# Patient Record
Sex: Male | Born: 1950
Health system: Southern US, Community
[De-identification: ages and names within clinical notes are randomized; demographics above are authoritative.]

## PROBLEM LIST (undated history)

## (undated) ENCOUNTER — Emergency Department (HOSPITAL_COMMUNITY): Admission: EM | Discharge status: Absent without leave | Payer: Medicare Other | Source: Home / Self Care

## (undated) DIAGNOSIS — G4730 Sleep apnea, unspecified: Secondary | ICD-10-CM

## (undated) DIAGNOSIS — T7840XA Allergy, unspecified, initial encounter: Secondary | ICD-10-CM

## (undated) DIAGNOSIS — M1990 Unspecified osteoarthritis, unspecified site: Secondary | ICD-10-CM

## (undated) DIAGNOSIS — I10 Essential (primary) hypertension: Secondary | ICD-10-CM

## (undated) DIAGNOSIS — G8929 Other chronic pain: Secondary | ICD-10-CM

## (undated) DIAGNOSIS — I513 Intracardiac thrombosis, not elsewhere classified: Secondary | ICD-10-CM

## (undated) DIAGNOSIS — D126 Benign neoplasm of colon, unspecified: Secondary | ICD-10-CM

## (undated) DIAGNOSIS — E669 Obesity, unspecified: Secondary | ICD-10-CM

## (undated) DIAGNOSIS — N401 Enlarged prostate with lower urinary tract symptoms: Secondary | ICD-10-CM

## (undated) DIAGNOSIS — E785 Hyperlipidemia, unspecified: Secondary | ICD-10-CM

## (undated) DIAGNOSIS — M5430 Sciatica, unspecified side: Secondary | ICD-10-CM

## (undated) DIAGNOSIS — K439 Ventral hernia without obstruction or gangrene: Secondary | ICD-10-CM

## (undated) DIAGNOSIS — Z87442 Personal history of urinary calculi: Secondary | ICD-10-CM

## (undated) DIAGNOSIS — I219 Acute myocardial infarction, unspecified: Secondary | ICD-10-CM

## (undated) DIAGNOSIS — G2581 Restless legs syndrome: Secondary | ICD-10-CM

## (undated) DIAGNOSIS — G4700 Insomnia, unspecified: Secondary | ICD-10-CM

## (undated) DIAGNOSIS — F339 Major depressive disorder, recurrent, unspecified: Secondary | ICD-10-CM

## (undated) DIAGNOSIS — I2699 Other pulmonary embolism without acute cor pulmonale: Secondary | ICD-10-CM

## (undated) DIAGNOSIS — F419 Anxiety disorder, unspecified: Secondary | ICD-10-CM

## (undated) DIAGNOSIS — E0500 Thyrotoxicosis with diffuse goiter without thyrotoxic crisis or storm: Secondary | ICD-10-CM

## (undated) DIAGNOSIS — Z86711 Personal history of pulmonary embolism: Secondary | ICD-10-CM

## (undated) DIAGNOSIS — I471 Supraventricular tachycardia: Secondary | ICD-10-CM

## (undated) DIAGNOSIS — M48062 Spinal stenosis, lumbar region with neurogenic claudication: Secondary | ICD-10-CM

## (undated) DIAGNOSIS — I4819 Other persistent atrial fibrillation: Secondary | ICD-10-CM

## (undated) DIAGNOSIS — G4733 Obstructive sleep apnea (adult) (pediatric): Secondary | ICD-10-CM

## (undated) DIAGNOSIS — I340 Nonrheumatic mitral (valve) insufficiency: Secondary | ICD-10-CM

## (undated) DIAGNOSIS — N138 Other obstructive and reflux uropathy: Secondary | ICD-10-CM

## (undated) HISTORY — PX: TONSILLECTOMY: SUR1361

## (undated) HISTORY — DX: Allergy, unspecified, initial encounter: T78.40XA

## (undated) HISTORY — DX: Benign prostatic hyperplasia with lower urinary tract symptoms: N40.1

## (undated) HISTORY — DX: Essential (primary) hypertension: I10

## (undated) HISTORY — PX: CATARACT EXTRACTION: SUR2

## (undated) HISTORY — DX: Other persistent atrial fibrillation: I48.19

## (undated) HISTORY — DX: Obstructive sleep apnea (adult) (pediatric): G47.33

## (undated) HISTORY — DX: Spinal stenosis, lumbar region with neurogenic claudication: M48.062

## (undated) HISTORY — DX: Major depressive disorder, recurrent, unspecified: F33.9

## (undated) HISTORY — PX: KNEE ARTHROSCOPY: SUR90

## (undated) HISTORY — PX: REPLACEMENT TOTAL KNEE: SUR1224

## (undated) HISTORY — DX: Thyrotoxicosis with diffuse goiter without thyrotoxic crisis or storm: E05.00

## (undated) HISTORY — DX: Unspecified osteoarthritis, unspecified site: M19.90

## (undated) HISTORY — DX: Nonrheumatic mitral (valve) insufficiency: I34.0

## (undated) HISTORY — DX: Acute myocardial infarction, unspecified: I21.9

## (undated) HISTORY — DX: Benign neoplasm of colon, unspecified: D12.6

## (undated) HISTORY — DX: Sleep apnea, unspecified: G47.30

## (undated) HISTORY — DX: Hyperlipidemia, unspecified: E78.5

## (undated) HISTORY — DX: Obesity, unspecified: E66.9

## (undated) HISTORY — PX: ROTATOR CUFF REPAIR: SHX139

## (undated) HISTORY — DX: Insomnia, unspecified: G47.00

## (undated) HISTORY — DX: Anxiety disorder, unspecified: F41.9

## (undated) HISTORY — DX: Ventral hernia without obstruction or gangrene: K43.9

## (undated) HISTORY — DX: Other pulmonary embolism without acute cor pulmonale: I26.99

## (undated) HISTORY — DX: Other obstructive and reflux uropathy: N13.8

## (undated) HISTORY — DX: Other chronic pain: G89.29

## (undated) HISTORY — DX: Intracardiac thrombosis, not elsewhere classified: I51.3

## (undated) HISTORY — PX: FEMUR FRACTURE SURGERY: SHX633

## (undated) HISTORY — DX: Sciatica, unspecified side: M54.30

## (undated) HISTORY — PX: EYE SURGERY: SHX253

## (undated) HISTORY — PX: COLONOSCOPY W/ POLYPECTOMY: SHX1380

---

## 1998-11-26 ENCOUNTER — Ambulatory Visit (HOSPITAL_COMMUNITY): Admission: RE | Admit: 1998-11-26 | Discharge: 1998-11-26 | Payer: Self-pay | Admitting: Internal Medicine

## 1999-11-23 ENCOUNTER — Emergency Department (HOSPITAL_COMMUNITY): Admission: EM | Admit: 1999-11-23 | Discharge: 1999-11-24 | Payer: Self-pay | Admitting: Emergency Medicine

## 2004-01-21 ENCOUNTER — Ambulatory Visit: Payer: Self-pay | Admitting: Family Medicine

## 2004-02-29 ENCOUNTER — Ambulatory Visit: Payer: Self-pay | Admitting: Family Medicine

## 2004-03-06 ENCOUNTER — Ambulatory Visit: Payer: Self-pay | Admitting: Family Medicine

## 2004-05-29 ENCOUNTER — Inpatient Hospital Stay (HOSPITAL_COMMUNITY): Admission: RE | Admit: 2004-05-29 | Discharge: 2004-06-01 | Payer: Self-pay | Admitting: Orthopedic Surgery

## 2005-03-16 ENCOUNTER — Ambulatory Visit: Payer: Self-pay | Admitting: Family Medicine

## 2005-03-18 ENCOUNTER — Encounter: Admission: RE | Admit: 2005-03-18 | Discharge: 2005-03-18 | Payer: Self-pay | Admitting: Family Medicine

## 2005-03-30 ENCOUNTER — Ambulatory Visit: Payer: Self-pay | Admitting: Pulmonary Disease

## 2005-04-13 ENCOUNTER — Ambulatory Visit: Payer: Self-pay | Admitting: Family Medicine

## 2005-04-17 ENCOUNTER — Ambulatory Visit (HOSPITAL_BASED_OUTPATIENT_CLINIC_OR_DEPARTMENT_OTHER): Admission: RE | Admit: 2005-04-17 | Discharge: 2005-04-17 | Payer: Self-pay | Admitting: Pulmonary Disease

## 2005-04-27 ENCOUNTER — Ambulatory Visit: Payer: Self-pay | Admitting: Family Medicine

## 2005-04-30 ENCOUNTER — Ambulatory Visit: Payer: Self-pay | Admitting: Pulmonary Disease

## 2005-05-11 ENCOUNTER — Ambulatory Visit: Payer: Self-pay | Admitting: Pulmonary Disease

## 2005-07-20 ENCOUNTER — Ambulatory Visit: Payer: Self-pay | Admitting: Pulmonary Disease

## 2005-08-10 ENCOUNTER — Ambulatory Visit: Payer: Self-pay | Admitting: Pulmonary Disease

## 2005-12-24 ENCOUNTER — Ambulatory Visit (HOSPITAL_COMMUNITY): Admission: RE | Admit: 2005-12-24 | Discharge: 2005-12-25 | Payer: Self-pay | Admitting: Orthopedic Surgery

## 2006-04-13 ENCOUNTER — Ambulatory Visit: Payer: Self-pay | Admitting: Pulmonary Disease

## 2006-11-05 ENCOUNTER — Ambulatory Visit: Payer: Self-pay | Admitting: Family Medicine

## 2006-12-27 ENCOUNTER — Telehealth: Payer: Self-pay | Admitting: Family Medicine

## 2007-07-04 ENCOUNTER — Ambulatory Visit: Payer: Self-pay | Admitting: Family Medicine

## 2007-07-04 LAB — CONVERTED CEMR LAB
ALT: 17 units/L (ref 0–53)
AST: 19 units/L (ref 0–37)
Albumin: 4.4 g/dL (ref 3.5–5.2)
Alkaline Phosphatase: 67 units/L (ref 39–117)
BUN: 17 mg/dL (ref 6–23)
Basophils Absolute: 0 10*3/uL (ref 0.0–0.1)
Basophils Relative: 0.5 % (ref 0.0–1.0)
Bilirubin Urine: NEGATIVE
Bilirubin, Direct: 0.1 mg/dL (ref 0.0–0.3)
Blood in Urine, dipstick: NEGATIVE
CO2: 30 meq/L (ref 19–32)
Calcium: 9.3 mg/dL (ref 8.4–10.5)
Chloride: 104 meq/L (ref 96–112)
Cholesterol: 209 mg/dL (ref 0–200)
Creatinine, Ser: 0.9 mg/dL (ref 0.4–1.5)
Eosinophils Absolute: 0.2 10*3/uL (ref 0.0–0.7)
Eosinophils Relative: 2.5 % (ref 0.0–5.0)
GFR calc Af Amer: 111 mL/min
GFR calc non Af Amer: 92 mL/min
Glucose, Bld: 89 mg/dL (ref 70–99)
Glucose, Urine, Semiquant: NEGATIVE
HCT: 44.1 % (ref 39.0–52.0)
HDL: 35.9 mg/dL — ABNORMAL LOW (ref >39.0)
Hemoglobin: 15.1 g/dL (ref 13.0–17.0)
Ketones, urine, test strip: NEGATIVE
LDL Cholesterol: 151 mg/dL — ABNORMAL HIGH (ref 0–99)
Lymphocytes Relative: 20.3 % (ref 12.0–46.0)
MCHC: 34.3 g/dL (ref 30.0–36.0)
MCV: 89.8 fL (ref 78.0–100.0)
Monocytes Absolute: 0.7 10*3/uL (ref 0.1–1.0)
Monocytes Relative: 7.8 % (ref 3.0–12.0)
Neutro Abs: 6.2 10*3/uL (ref 1.4–7.7)
Neutrophils Relative %: 68.9 % (ref 43.0–77.0)
Nitrite: NEGATIVE
PSA: 1.05 ng/mL (ref 0.10–4.00)
Platelets: 221 10*3/uL (ref 150–400)
Potassium: 3.9 meq/L (ref 3.5–5.1)
Protein, U semiquant: NEGATIVE
RBC: 4.91 M/uL (ref 4.22–5.81)
RDW: 13 % (ref 11.5–14.6)
Sodium: 143 meq/L (ref 135–145)
Specific Gravity, Urine: 1.02
TSH: 2.73 microintl units/mL (ref 0.35–5.50)
Total Bilirubin: 0.8 mg/dL (ref 0.3–1.2)
Total CHOL/HDL Ratio: 5.8
Total Protein: 7.3 g/dL (ref 6.0–8.3)
Triglycerides: 111 mg/dL (ref 0–149)
Urobilinogen, UA: 0.2
VLDL: 22 mg/dL (ref 0–40)
WBC Urine, dipstick: NEGATIVE
WBC: 8.9 10*3/uL (ref 4.5–10.5)
pH: 6

## 2007-07-18 ENCOUNTER — Ambulatory Visit: Payer: Self-pay | Admitting: Family Medicine

## 2007-07-18 DIAGNOSIS — F411 Generalized anxiety disorder: Secondary | ICD-10-CM | POA: Insufficient documentation

## 2007-08-15 ENCOUNTER — Ambulatory Visit: Payer: Self-pay | Admitting: Family Medicine

## 2007-08-15 DIAGNOSIS — I119 Hypertensive heart disease without heart failure: Secondary | ICD-10-CM | POA: Insufficient documentation

## 2007-09-19 ENCOUNTER — Ambulatory Visit: Payer: Self-pay | Admitting: Family Medicine

## 2007-10-17 ENCOUNTER — Ambulatory Visit: Payer: Self-pay | Admitting: Family Medicine

## 2007-10-19 LAB — CONVERTED CEMR LAB
BUN: 15 mg/dL (ref 6–23)
CO2: 30 meq/L (ref 19–32)
Calcium: 9.6 mg/dL (ref 8.4–10.5)
Chloride: 103 meq/L (ref 96–112)
Creatinine, Ser: 0.8 mg/dL (ref 0.4–1.5)
GFR calc Af Amer: 127 mL/min
GFR calc non Af Amer: 105 mL/min
Glucose, Bld: 90 mg/dL (ref 70–99)
Potassium: 3.7 meq/L (ref 3.5–5.1)
Sodium: 141 meq/L (ref 135–145)

## 2008-06-19 ENCOUNTER — Telehealth: Payer: Self-pay | Admitting: *Deleted

## 2008-06-20 ENCOUNTER — Telehealth: Payer: Self-pay | Admitting: Family Medicine

## 2008-06-20 ENCOUNTER — Ambulatory Visit: Payer: Self-pay | Admitting: Family Medicine

## 2008-06-20 DIAGNOSIS — J45909 Unspecified asthma, uncomplicated: Secondary | ICD-10-CM

## 2008-06-20 HISTORY — DX: Unspecified asthma, uncomplicated: J45.909
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
## 2010-02-06 NOTE — Progress Notes (Signed)
Summary: grandchildren  Phone Note Call from Patient   Caller: live Call For: Sydnie Sigmund Summary of Call: Calling to see if he should reschedule grandchildren's weekend stay with them.  Told him yes, if he can would be best for both him getting extra rest & the children not being exposed possibly.   Initial call taken by: Rudy Jew, RN,  June 20, 2008 12:08 PM  Follow-up for Phone Call        Phone Call Completed Follow-up by: Kern Reap CMA,  June 20, 2008 12:18 PM

## 2010-02-06 NOTE — Progress Notes (Signed)
Summary: refill  Phone Note Call from Patient Call back at [phone]   Caller: patient triage message Call For: Malakye Nolden Summary of Call: lexapro [facility] [address]  Initial call taken by: Roselle Locus,  December 27, 2006 1:07 PM  Follow-up for Phone Call        DONE EMR Follow-up by: Arcola Jansky, RN,  December 29, 2006 1:27 PM

## 2010-02-07 ENCOUNTER — Inpatient Hospital Stay (HOSPITAL_COMMUNITY)
Admission: EM | Admit: 2010-02-07 | Discharge: 2010-02-10 | DRG: 243 | Disposition: A | Discharge status: Home / Self Care | Payer: Federal, State, Local not specified - PPO | Attending: Internal Medicine | Admitting: Internal Medicine

## 2010-02-07 DIAGNOSIS — E876 Hypokalemia: Secondary | ICD-10-CM | POA: Diagnosis present

## 2010-02-07 DIAGNOSIS — M1610 Unilateral primary osteoarthritis, unspecified hip: Secondary | ICD-10-CM | POA: Diagnosis present

## 2010-02-07 DIAGNOSIS — F411 Generalized anxiety disorder: Secondary | ICD-10-CM | POA: Diagnosis present

## 2010-02-07 DIAGNOSIS — M25559 Pain in unspecified hip: Secondary | ICD-10-CM | POA: Diagnosis present

## 2010-02-07 DIAGNOSIS — I10 Essential (primary) hypertension: Secondary | ICD-10-CM | POA: Diagnosis present

## 2010-02-07 DIAGNOSIS — Z809 Family history of malignant neoplasm, unspecified: Secondary | ICD-10-CM

## 2010-02-07 DIAGNOSIS — M169 Osteoarthritis of hip, unspecified: Secondary | ICD-10-CM | POA: Diagnosis present

## 2010-02-07 DIAGNOSIS — IMO0002 Reserved for concepts with insufficient information to code with codable children: Principal | ICD-10-CM | POA: Diagnosis present

## 2010-02-08 ENCOUNTER — Emergency Department (HOSPITAL_COMMUNITY): Payer: Federal, State, Local not specified - PPO

## 2010-02-08 LAB — URINALYSIS, ROUTINE W REFLEX MICROSCOPIC
Bilirubin Urine: NEGATIVE
Hgb urine dipstick: NEGATIVE
Ketones, ur: NEGATIVE mg/dL
Nitrite: NEGATIVE
Protein, ur: NEGATIVE mg/dL
Specific Gravity, Urine: 1.01 (ref 1.005–1.030)
Urine Glucose, Fasting: NEGATIVE mg/dL
Urobilinogen, UA: 0.2 mg/dL (ref 0.0–1.0)
pH: 7.5 (ref 5.0–8.0)

## 2010-02-08 LAB — CBC
HCT: 44.7 % (ref 39.0–52.0)
Hemoglobin: 16.8 g/dL (ref 13.0–17.0)
MCH: 30.6 pg (ref 26.0–34.0)
MCV: 81.4 fL (ref 78.0–100.0)
Platelets: 148 10*3/uL — ABNORMAL LOW (ref 150–400)
RBC: 5.49 MIL/uL (ref 4.22–5.81)
RDW: 12.9 % (ref 11.5–15.5)
WBC: 10.1 10*3/uL (ref 4.0–10.5)

## 2010-02-08 LAB — DIFFERENTIAL
Basophils Absolute: 0.1 10*3/uL (ref 0.0–0.1)
Basophils Relative: 1 % (ref 0–1)
Eosinophils Absolute: 0.2 10*3/uL (ref 0.0–0.7)
Eosinophils Relative: 2 % (ref 0–5)
Lymphocytes Relative: 21 % (ref 12–46)
Lymphs Abs: 2.1 10*3/uL (ref 0.7–4.0)
Monocytes Absolute: 0.8 10*3/uL (ref 0.1–1.0)
Monocytes Relative: 8 % (ref 3–12)
Neutro Abs: 6.9 10*3/uL (ref 1.7–7.7)
Neutrophils Relative %: 68 % (ref 43–77)

## 2010-02-08 LAB — BASIC METABOLIC PANEL
BUN: 9 mg/dL (ref 6–23)
CO2: 27 mEq/L (ref 19–32)
Calcium: 9.3 mg/dL (ref 8.4–10.5)
Chloride: 98 mEq/L (ref 96–112)
Creatinine, Ser: 0.75 mg/dL (ref 0.4–1.5)
GFR calc Af Amer: >60 mL/min (ref >60)
GFR calc non Af Amer: >60 mL/min (ref >60)
Glucose, Bld: 118 mg/dL — ABNORMAL HIGH (ref 70–99)
Potassium: 3.6 mEq/L (ref 3.5–5.1)
Sodium: 137 mEq/L (ref 135–145)

## 2010-02-08 LAB — HEPATIC FUNCTION PANEL
ALT: 12 U/L (ref 0–53)
AST: 20 U/L (ref 0–37)
Albumin: 4.1 g/dL (ref 3.5–5.2)
Alkaline Phosphatase: 68 U/L (ref 39–117)
Bilirubin, Direct: 0.1 mg/dL (ref 0.0–0.3)
Indirect Bilirubin: 1.1 mg/dL — ABNORMAL HIGH (ref 0.3–0.9)
Total Bilirubin: 1.2 mg/dL (ref 0.3–1.2)
Total Protein: 7.1 g/dL (ref 6.0–8.3)

## 2010-02-08 MED ORDER — IOHEXOL 300 MG/ML  SOLN
125.0000 mL | Freq: Once | INTRAMUSCULAR | Status: AC | PRN
  Administered 2010-02-08: 125 mL via INTRAVENOUS

## 2010-02-09 ENCOUNTER — Encounter (HOSPITAL_COMMUNITY): Payer: Self-pay | Admitting: Radiology

## 2010-02-09 ENCOUNTER — Inpatient Hospital Stay (HOSPITAL_COMMUNITY): Payer: Federal, State, Local not specified - PPO

## 2010-02-09 LAB — CBC
HCT: 39.8 % (ref 39.0–52.0)
Hemoglobin: 14.2 g/dL (ref 13.0–17.0)
MCH: 29.9 pg (ref 26.0–34.0)
MCHC: 35.7 g/dL (ref 30.0–36.0)
MCV: 83.8 fL (ref 78.0–100.0)
Platelets: 171 10*3/uL (ref 150–400)
RBC: 4.75 MIL/uL (ref 4.22–5.81)
RDW: 12.8 % (ref 11.5–15.5)
WBC: 9.2 10*3/uL (ref 4.0–10.5)

## 2010-02-09 LAB — BASIC METABOLIC PANEL
BUN: 12 mg/dL (ref 6–23)
CO2: 32 mEq/L (ref 19–32)
Calcium: 9.1 mg/dL (ref 8.4–10.5)
Chloride: 98 mEq/L (ref 96–112)
Creatinine, Ser: 0.87 mg/dL (ref 0.4–1.5)
GFR calc Af Amer: >60 mL/min (ref >60)
GFR calc non Af Amer: >60 mL/min (ref >60)
Glucose, Bld: 106 mg/dL — ABNORMAL HIGH (ref 70–99)
Potassium: 3 mEq/L — ABNORMAL LOW (ref 3.5–5.1)
Sodium: 138 mEq/L (ref 135–145)

## 2010-02-10 LAB — BASIC METABOLIC PANEL
BUN: 16 mg/dL (ref 6–23)
CO2: 32 mEq/L (ref 19–32)
Calcium: 9.3 mg/dL (ref 8.4–10.5)
Chloride: 99 mEq/L (ref 96–112)
Creatinine, Ser: 1.03 mg/dL (ref 0.4–1.5)
GFR calc Af Amer: >60 mL/min (ref >60)
GFR calc non Af Amer: >60 mL/min (ref >60)
Glucose, Bld: 95 mg/dL (ref 70–99)
Potassium: 3.1 mEq/L — ABNORMAL LOW (ref 3.5–5.1)
Sodium: 140 mEq/L (ref 135–145)

## 2010-02-10 LAB — MAGNESIUM: Magnesium: 2.3 mg/dL (ref 1.5–2.5)

## 2010-02-11 LAB — UIFE/LIGHT CHAINS/TP QN, 24-HR UR
Albumin, U: DETECTED
Alpha 1, Urine: DETECTED — AB
Alpha 2, Urine: DETECTED — AB
Beta, Urine: DETECTED — AB
Free Kappa Lt Chains,Ur: 2.66 mg/dL — ABNORMAL HIGH (ref 0.04–1.51)
Free Lambda Lt Chains,Ur: 0.12 mg/dL (ref 0.08–1.01)
Gamma Globulin, Urine: NOT DETECTED
Total Protein, Urine: 5.2 mg/dL

## 2010-02-11 LAB — PROTEIN ELECTROPH W RFLX QUANT IMMUNOGLOBULINS
Albumin ELP: 61.5 % (ref 55.8–66.1)
Alpha-1-Globulin: 3.7 % (ref 2.9–4.9)
Alpha-2-Globulin: 10.1 % (ref 7.1–11.8)
Beta 2: 3.9 % (ref 3.2–6.5)
Beta Globulin: 5.9 % (ref 4.7–7.2)
Gamma Globulin: 14.9 % (ref 11.1–18.8)
M-Spike, %: NOT DETECTED g/dL
Total Protein ELP: 7.4 g/dL (ref 6.0–8.3)

## 2010-02-12 LAB — UIFE/LIGHT CHAINS/TP QN, 24-HR UR
Albumin, U: DETECTED
Alpha 1, Urine: DETECTED — AB
Alpha 2, Urine: DETECTED — AB
Beta, Urine: DETECTED — AB
Free Kappa Lt Chains,Ur: 6.4 mg/dL — ABNORMAL HIGH (ref 0.04–1.51)
Free Kappa/Lambda Ratio: 16.84 ratio — ABNORMAL HIGH (ref 0.46–4.00)
Free Lambda Excretion/Day: 5.89 mg/d
Free Lambda Lt Chains,Ur: 0.38 mg/dL (ref 0.08–1.01)
Free Lt Chn Excr Rate: 99.2 mg/d
Gamma Globulin, Urine: DETECTED — AB
Time: 24 hours
Total Protein, Urine-Ur/day: 181 mg/d — ABNORMAL HIGH (ref 10–140)
Total Protein, Urine: 11.7 mg/dL
Volume, Urine: 1550 mL

## 2010-02-17 NOTE — Discharge Summary (Signed)
NAME:  Brandon [name], Brandon [name]                    ACCOUNT NO.:  [id number]  MEDICAL RECORD NO.:  [id number]           PATIENT TYPE:  I  LOCATION:  1609                         FACILITY:  [facility]  PHYSICIAN:  Osvaldo Shipper, MD     DATE OF BIRTH:  18-Jul-1950  DATE OF ADMISSION:  02/07/2010 DATE OF DISCHARGE:  02/10/2010                              DISCHARGE SUMMARY   PRIMARY CARE PHYSICIAN:  Dr. Tresa Endo  During this hospitalization, he was seen in consultation by Dr. Charlann Boxer from [facility].  IMAGING STUDIES DONE DURING THIS ADMISSION: 1. X-ray of the right hip on February 4, which showed no evidence for     acute hip fracture or dislocation.  Mild bilateral hip     osteoarthritis was seen. 2. CT of the abdomen and pelvis with contrast was done, which did not     show any acute lesions, nephrolithiasis without obstructive     uropathy was noted.  Bilateral hip degenerative joint disease was     noted. 3. X-ray of the lumbar spine showed moderate spondylosis of the lumbar     spine mostly affecting the L1-L2 and L2-L3 levels.  Subsequently,     the patient had an MRI of the lumbar spine, which showed mild     lateral recess encroachment bilaterally at L2-L3, shallow broad-     based foraminal and extraforaminal disk protrusion on the right     irritating the right L2 nerve was seen, shallow broad-based left     foraminal and extraforaminal disk protrusion at L3-L4 was seen. 4. MRI of the right hip showed mild symmetric hip joint degenerative     changes bilaterally, but no acute bony findings.  Remote     posttraumatic and postsurgical changes involving the     subtrochanteric portion of the left hip.  DISCHARGE DIAGNOSES: 1. Right hip pain, possibly radiculopathy from lumbar spine. 2. History of anxiety disorder. 3. History of hypertension. 4. Hypokalemia.  BRIEF HOSPITAL COURSE:  Briefly, this is a 60 year old Caucasian male who presented to the hospital with complaints of severe  right hip pain. The patient told us that the pain had been ongoing for almost a week and a half.  The patient was evaluated in the ED, no acute findings were noted.  We were asked to evaluate for pain management and admission. The patient was admitted.  He was seen by Dr. Charlann Boxer who recommended steroids.  Initially, we did not get an MRI; however, his pain was not improving, so MRI was done, which has been discussed above.  It appears that the pain is probably radiculopathy from his lumbar spine area rather than anything in the hip.  No lesions were noted.  Since there was a family history of multiple myeloma, we went ahead and ordered SPEP and UPEP and all those are pending at this time.  The patient is able to ambulate.  He experienced severe pain when he is rolling over in his bed, when trying to sit up, and when he tries to sit down.  When he is standing, he is able to  weight bear.  This typically is also there when he tries to have a bowel movement.  So, in view of this, we have to put him on a bowel regimen, muscle relaxants, Neurontin, and analgesic agents.  I have explained to the patient that this may take several weeks for it to get better.  There is no role for immediate surgery at this time.  Dr. Charlann Boxer wants to follow up in his office in 2 weeks.  We will continue with steroid taper.  He was also noted to have hypokalemia.  Today, he was given potassium; however, he continues to be hypokalemic today.  Magnesium level has not been checked; however, we will empirically give him magnesium oxide.  PHYSICAL EXAMINATION:  GENERAL:  On the day of discharge, the patient appears to be comfortable as far as he is lying flat on the bed.  It is only when he rolls over that, he has extreme pain.  Otherwise, he denies any other problems. VITAL SIGNS:  Stable.  His blood pressure does tend to run high whenever he is having acute pain.  Heart rate in the 50s, occasionally in the 40s;  respiratory rate is 18; saturation 93% on room air. LUNGS:  Clear to auscultation bilaterally with no wheezing, rales, or rhonchi. CARDIOVASCULAR:  S1, S2 is normal, regular.  No S3, S4, rubs, murmurs, or bruits. ABDOMEN:  Soft, nontender, nondistended.  DISCHARGE MEDICATIONS: 1. Colace 100 mg p.o. b.i.d. 2. Neurontin 300 mg capsule, 2 capsules 3 times a day. 3. Magnesium oxide 400 mg p.o. b.i.d. for 10 days. 4. Potassium chloride 20 mEq 2 tablets daily for 7 days. 5. Methocarbamol 750 mg 4 times daily. 6. Oxycodone IR 5 mg 1-2 tablets every 4 hours as needed. 7. Prednisone taper. 8. MiraLax 17 g p.o. b.i.d. 9. Toradol 10 mg p.o. q.6 h. as needed. 10.Atenolol/chlorthalidone 50/25 one tablet every morning. 11.Lexapro 20 mg p.o. daily.  FOLLOWUP:  With Dr. Charlann Boxer in 2 weeks.  PHYSICAL ACTIVITY:  As tolerated.  He has been asked to keep moving both his lower extremities and to wear compression stockings during the nighttime to avoid blood clots.  DIET:  As before.  DIAGNOSTIC STUDIES:  Pending studies include at this time, SPEP and UPEP.  TOTAL TIME ON THIS DISCHARGE ENCOUNTER:  35 minutes.  Please also note we will have PT to see him before discharge.     Osvaldo Shipper, MD     GK/MEDQ  D:  02/10/2010  T:  02/10/2010  Job:  [id number]  Electronically Signed by Osvaldo Shipper MD on 02/17/2010 07:44:55 PM

## 2010-02-17 NOTE — H&P (Signed)
NAME:  Brandon [name], Brandon [name]                    ACCOUNT NO.:  [id number]  MEDICAL RECORD NO.:  [id number]           PATIENT TYPE:  E  LOCATION:  WLED                         [facility]ACILITY:  [facility]  PHYSICIAN:  Osvaldo Shipper, MD     DATE OF BIRTH:  04-Feb-1950  DATE OF ADMISSION:  02/07/2010 DATE OF DISCHARGE:                             HISTORY & PHYSICAL   PRIMARY CARE PHYSICIAN:  The patient's primary care physician is Dr. Tresa Endo.  His orthopedic doctor is in [facility].  ADMISSION DIAGNOSES: 1. Severe right hip pain with immobilization. 2. History of hypertension. 3. History of anxiety disorder.  CHIEF COMPLAINT:  Right hip pain for about 2 weeks.  HISTORY OF PRESENT ILLNESS:  The patient is a 60 year old Caucasian male who has a history of hypertension and anxiety disorder who also has arthritis who was in his usual state of health until last week when he had to go to [employer] to do volunteering work.  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  He did this on Tuesday, Thursday, and Saturday.  When he went on Tuesday, he had to sit for prolonged period of time and he noticed after the end of the day that he was having pain in the right hip area.  The pain was located anteriorly, he said over the phone it was 10 out of 10 in intensity.  He applied heating pad, took Advil.  He went back on Thursday and Saturday to volunteer again, the pain continued to get worse.  He applied a heating pad with no relief and he found that he was finding it more and more difficult to move around.  Over the last week, the pain has gotten severe.  The Tylenol has not helped either.  The pain was initially radiating to the lower back but not currently.  It was initially radiating to the groin area but not currently.  He denies any falls or any other trauma.  The pain would be more severe when he would walk or move or rollover.  The pain got so severe that last night he  had to call 911.  Currently, he does live with his wife but she is currently out of town.  The patient had a similar pain about a year ago, went to the orthopedic doctor, had MRI done which was negative.  He was given pain medications and the pain resolved after a week.  MEDICATIONS AT HOME:  Atenolol and Lexapro, doses are unknown; and he has been taking some Advil over-the-counter for pain relief.  ALLERGIES:  No known drug allergies.  PAST MEDICAL HISTORY:  Positive for anxiety disorder, hypertension, arthritis.  He had a left total knee replacement.  He has had fractures of the left femur x2.  He has had right shoulder rotator cuff surgery. He had arthroscopy to the left shoulder.  SOCIAL HISTORY:  He lives in [place] with his wife who is currently out of town.  He is a retired Advertising copywriter.  He denies smoking.  He rarely drinks alcohol.  No illicit drug use.  He is fairly active.  He does swim and has cycled in the past.  FAMILY HISTORY:  Father had multiple myeloma.  Mother had asthma.  REVIEW OF SYSTEMS:  GENERAL SYSTEM:  Positive for weakness, malaise. HEENT:  Unremarkable.  CARDIOVASCULAR:  Unremarkable.  GI: Unremarkable. GU: Unremarkable.  NEUROLOGIC:  Unremarkable.  PSYCHIATRIC: Unremarkable.  MUSCULOSKELETAL:  As in HPI.  Other systems reviewed and found to be negative.  He denies any dysuria or hematuria, specifically. Denies any nausea or vomiting.  PHYSICAL EXAMINATION:  VITAL SIGNS:  Temperature 98.8, blood pressure has been as high as 183/101 probably from the pain.  His heart rate is 57, respiratory rate is 16, saturation 97% on room air. GENERAL EXAM:  He is slightly obese white male, in no distress, slightly anxious. HEENT:  Head is normocephalic, atraumatic.  Pupils are equal and reacting.  No pallor, no icterus.  Oral mucous membranes moist.  No oral lesions are noted. NECK:  Soft, supple.  No thyromegaly is appreciated.  No cervical, supraclavicular,  inguinal lymphadenopathy is present. LUNGS:  Clear to auscultation bilaterally with no wheezing, rales or rhonchi. CARDIOVASCULAR:  S1, S2 are normal and regular.  No rubs, murmurs or bruits.  Slightly bradycardic. ABDOMEN:  Soft, nontender, nondistended.  Bowel sounds are present.  No masses or organomegaly appreciated. GU: Deferred. NEUROLOGIC:  He is alert, oriented x3.  No focal neurological deficits are present. EXTREMITIES:  Examination of the right hip area, there was no limitation with flexion.  No limitation with internal or external rotation. Extension could not be checked because the patient could not roll over. Straight leg raising was also unremarkable.  No palpable tenderness was present.  When we attempted to roll the patient to the right, the patient had excruciating pain.  LABORATORY DATA:  His CBC is remarkable just for a platelet count of 148.  On the WBC morphology, toxic granulation was mentioned, however, the total white count is normal.  BMET is unremarkable except for glucose of 118.  UA was negative.  IMAGING STUDIES:  He had a right hip film which showed no evidence for any fracture or dislocation, hip osteoarthritis was noted bilaterally. CT of the abdomen and pelvis without contrast was done, did not show any acute abdominal or pelvic findings, nephrolithiasis without obstructive uropathy was noted.  Bilateral hip degenerative changes without focal destructive lesion were also seen.  ASSESSMENT:  This is a 60 year old Caucasian male with hypertension, anxiety disorder who comes in with right hip pain.  He is essentially unable to move.  He has severe pain with movements.  He is unable to function.  Reason for this pain is not entirely clear.  There is no obvious fracture noted either on the x-ray or on the CAT scan.  He could have some muscular spasm, this could be pain coming from the spine.  PLAN: 1. Right hip pain.  We will observe the patient in  the hospital, start     him on pain medications Toradol, muscle relaxants.  I have     discussed this case with Dr. Charlann Boxer who is covering for [facility].  He will see the patient in the ED.  He recommends     lumbar spine films and starting the patient on prednisone.  PT/OT     will also be utilized. 2. History of hypertension.  The blood pressure is high probably from     the pain.  He will resume his atenolol and  monitor his blood     pressure closely. 3. Anxiety disorder.  Continue with Lexapro. 4. Deep vein thrombosis prophylaxis utilized. 5. Labs will be followed up to the morning. 6. He is a full code.  Further management decisions will depend on results of further testing and patient's response to treatment.  Osvaldo Shipper, MD     GK/MEDQ  D:  02/08/2010  T:  02/08/2010  Job:  [id number]  cc:   Madlyn Frankel Charlann Boxer, M.D. Fax: [phone]  Electronically Signed by Osvaldo Shipper MD on 02/17/2010 07:46:05 PM

## 2010-02-18 NOTE — Consult Note (Signed)
NAME:  Brandon [name], Brandon [name]                    ACCOUNT NO.:  [id number]  MEDICAL RECORD NO.:  [id number]           PATIENT TYPE:  E  LOCATION:  WLED                         FACILITY:  [facility]  PHYSICIAN:  Madlyn Frankel. Charlann Boxer, M.D.  DATE OF BIRTH:  [birth year]  DATE OF CONSULTATION:  02/08/2010 DATE OF DISCHARGE:                                CONSULTATION   CHIEF COMPLAINT:  Right hip pain.  HISTORY:  Brandon [name] is a 60 year old gentleman who I had seen in the office before he was admitted to the hospital by the Medical Service for a couple-week history of some unrelenting anterior hip-type pain.  He was seen and evaluated in the emergency room and had radiographs.  This included a CT scan of his abdomen and pelvis.  Bony landmarks were able to be reviewed.  Brandon [name] reports a gradual onset of some pain a couple of weeks ago, but fairly significant pain over the past week, which is making it hard for him to get out of bed and move around.  He states that sitting upright hurts the worst.  He has had some pain mainly over the anterior superior iliac spine area per his localization.  He has had some bilateral groin pain, but no numbness and tingling, no loss of bowel or bladder dysfunction and some pain that has radiated around the back, but it seems to focus back to the anterior superior iliac spine. It is not specifically located to the groin at this point.  He has tried some medications at home without much relief.  PAST MEDICAL HISTORY:  Medical history includes hypertension and some anxiety.  PAST SURGICAL HISTORY:  History of a left femur fracture treated as well as a left total-knee replacement.  He has had a left shoulder arthroscopic surgery as well.  CURRENT MEDICATIONS:  Atenolol, Lexapro, and over-the-counter medications.  SOCIAL HISTORY:  He lives in [place].  He is a Advertising copywriter still. He denies any smoking and rarely drinks alcohol.  He enjoys exercising otherwise.  FAMILY  HISTORY:  Includes a father with multiple myeloma and mother with asthma.  REVIEW OF SYSTEMS:  Other than his recent reports of pain and discomfort related to his right hip, he has had no recent fevers, chills, night sweats.  No unexplained weight loss.  No headaches, dizziness, blurred vision, slurred speech.  No chest pain.  No productive cough or hemoptysis.  No true abdominal pain, nausea or vomiting.  No burning or frequency with urination.  No unilateral weakness or numbness reported unilaterally.  PHYSICAL EXAMINATION:  Brandon [name] was seen and evaluated in the T[facility] Unit of [facility].  He is in his hospital bed.  He moves his hip around just fine and he actually was lying on his right side and rolled over without much difficulty.  It is difficult to palpate the location of his pain but he points to the anterior superior iliac spine area but does not have much tenderness to palpation.  He had no pain with deep palpation to his abdominal area in this area.  He tolerates hip range  of motion without significant reproduced pain.  He is otherwise neurovascular intact in the right lower extremity compared to his left.  RADIOGRAPHS:  I reviewed AP pelvis and lateral right hip in addition to a CT scan of his abdomen, which showed bony details.  I see no evidence of any lytic lesions or expansile lesions within the iliac crest.  He has bilateral hip osteoarthritis, moderate to advanced.  An AP and lateral of his lumbar spine also were reviewed, indicating relative preservation of disk space with normal coronal and sagittal alignment.  ASSESSMENT AND PLAN:  Uncertain etiology to a pain over the anterior pelvic bony mass with a differential including either a hip osteoarthritis with an atypical presentation versus herniated disk versus perhaps the concern of a presentation of multiple myeloma would have to be in the differential given his family history.  At this point, I recommended an  attempt at a prednisone taper starting at 40 mg, 5 mg decreasing daily with followup with me in 2 weeks.  Perhaps the Medical Team would like to order protein analysis and maybe a urinalysis to check and rule out multiple myeloma as a source.  However, radiographs were not convincing of any significant bony pathology to date. Questions were encouraged and answers were reviewed with him regarding this.     Madlyn Frankel Charlann Boxer, M.D.     MDO/MEDQ  D:  02/08/2010  T:  02/08/2010  Job:  [id number]  Electronically Signed by Durene Romans M.D. on 02/11/2010 09:15:02 AM

## 2010-02-19 ENCOUNTER — Other Ambulatory Visit: Payer: Self-pay | Admitting: Specialist

## 2010-02-19 DIAGNOSIS — M549 Dorsalgia, unspecified: Secondary | ICD-10-CM

## 2010-02-20 ENCOUNTER — Other Ambulatory Visit: Payer: Federal, State, Local not specified - PPO

## 2010-02-20 ENCOUNTER — Ambulatory Visit
Admission: RE | Admit: 2010-02-20 | Discharge: 2010-02-20 | Disposition: A | Discharge status: Home / Self Care | Payer: Federal, State, Local not specified - PPO | Source: Ambulatory Visit | Attending: Specialist | Admitting: Specialist

## 2010-02-20 DIAGNOSIS — M549 Dorsalgia, unspecified: Secondary | ICD-10-CM

## 2010-02-27 ENCOUNTER — Other Ambulatory Visit: Payer: Self-pay | Admitting: Specialist

## 2010-02-27 DIAGNOSIS — R1031 Right lower quadrant pain: Secondary | ICD-10-CM

## 2010-02-27 DIAGNOSIS — M545 Low back pain, unspecified: Secondary | ICD-10-CM

## 2010-03-04 ENCOUNTER — Ambulatory Visit
Admission: RE | Admit: 2010-03-04 | Discharge: 2010-03-04 | Disposition: A | Discharge status: Home / Self Care | Payer: Federal, State, Local not specified - PPO | Source: Ambulatory Visit | Attending: Specialist | Admitting: Specialist

## 2010-03-04 DIAGNOSIS — M545 Low back pain, unspecified: Secondary | ICD-10-CM

## 2010-03-04 DIAGNOSIS — R1031 Right lower quadrant pain: Secondary | ICD-10-CM

## 2010-05-23 NOTE — Op Note (Signed)
NAME:  Brandon [name], Brandon [name]                    ACCOUNT NO.:  [id number]   MEDICAL RECORD NO.:  [id number]          PATIENT TYPE:  AMB   LOCATION:  SDS                          FACILITY:  [facility]   PHYSICIAN:  Vania Rea. Supple, M.D.  DATE OF BIRTH:  1950/02/19   DATE OF PROCEDURE:  12/24/2005  DATE OF DISCHARGE:                               OPERATIVE REPORT   PREOPERATIVE DIAGNOSIS:  1. Chronic right shoulder impingement syndrome.  2. Right shoulder rotator cuff tear.  3. Right shoulder AC joint arthrosis   POSTOPERATIVE DIAGNOSIS:  1. Chronic right shoulder impingement syndrome.  2. Right shoulder rotator cuff tear.  3. Right shoulder AC joint arthrosis.  4. Chondromalacia glenoid.  5. Extensive degenerative labral tear.  6. Intra-articular chondral loose body.   PROCEDURE:  1. Right shoulder examination under anesthesia.  2. Right shoulder diagnostic arthroscopy.  3. Debridement of extensive degenerative labral tear.  4. Chondroplasty of glenoid.  5. Removal of chondral loose body.  6. Arthroscopic subacromial decompression and bursectomy.  7. Arthroscopic distal clavicle resection.  8. Arthroscopic rotator cuff repair utilizing a double row suture      bridge repair construct.   SURGEON:  Vania Rea. Supple, M.D.   ASSISTANTFrench Ana Shuford PA-C.   ANESTHESIA:  Was a general endotracheal as well as a preop interscalene  block.   ESTIMATED BLOOD LOSS:  Minimal.   DRAINS:  None.   HISTORY:  Mr. Wainer is a 60 year old gentleman who has had chronic right  shoulder pain with weakness and restrictions in mobility and on  examination shows painful arc shoulder motion with pain, weakness on  testing of rotator cuff.  Preoperative x-rays and MRI scan confirm AC  arthrosis as well as a full-thickness tear of the rotator cuff.  Due to  his ongoing pain and functional limitations he is brought to the  operating room this time for planned right shoulder arthroscopy as  described below.   We  counseled Mr. Cerino on treatment options as well as risks versus  benefits thereof.  Possible surgical complications including infection,  neurovascular injury persistent pain, loss motion, anesthetic  complications, recurrence rotator cuff tear, and possible need for  additional surgery were all reviewed.  He understands and accepts and  agrees with our planned procedure.   PROCEDURE IN DETAIL:  After undergoing routine preop evaluation, the  patient received prophylactic antibiotics.  An interscalene block was  established in the holding area by the anesthesia department.  Placed  supine on the operating table and underwent smooth induction of a  general endotracheal anesthesia.  Turned to the left lateral decubitus  position on the beanbag and appropriately padded and protected.  The  right shoulder examination under anesthesia revealed nearly full passive  motion with some very mild capsule tightness at end range.  Gentle  stretching of shoulder was performed but there was no palpable or  audible release of adhesions.  Right arm was suspended at 70 degrees of  abduction with 15 pounds of traction then the right shoulder girdle  region was then sterilely  prepped and draped standard fashion.  A  posterior portal was established into the glenohumeral joint.  An  anterior portal established under direct visualization.  There were  noted the several small and one larger chondral or fibrous loose bodies  within the shoulder joint.  These evacuated through the arthroscopic  shaver.  There was extensive degenerative tearing of the anterior-  posterior and superior labrum and all these areas were debrided with a  shaver back to stable peripheral margin.  Biceps tendon showed normal  caliber and was in good condition and no obvious instability biceps  anchor.  Rotator cuff showed an obvious full-thickness defect involving  the distal aspect of the supraspinatus.  This was debrided from the   articular side.  There were no obvious instability patterns.  At this  point, fluid and instruments were removed from the glenohumeral joint  and arm was dropped down to 30 degrees of abduction.  The arthroscope  introduced in the subacromial space through the posterior portal and  direct lateral portal established in the subhumeral space.  Abundant  proliferative bursal tissue was encountered and resected with a  combination the shaver and the Arthrex wand.  The wand was then used to  remove the periosteum from the undersurface of the anterior half of the  acromion and subacromial decompression was performed with a bur creating  type 1 morphology.  The portals established directly anterior to distal  clavicle and distal clavicle resection performed with a bur.  Care was  taken to make sure the entire circumference of the distal clavicle could  be visualized to ensure adequate removal of bone.  We then completed the  subacromial bursectomy and debrided the rotator cuff from the bursal  side and trimmed the rotator cuff margin back to healthy tissue.  Residual soft tissue at the apex of the tuberosity toward the rotator  cuff side was removed with the Arthrex wand and this area was gently  abraded with a bur.  An accessory portal device was then established  through stab wound on the lateral margin of the acromion, placed Arthrex  bio corkscrew suture anchor.  The limbs of the suture anchor were then  passed through the adjacent margin of the rotator cuff utilizing a Mitek  suture retriever in a horizontal mattress construct.  These were then  tied with sliding locking knots followed by multiple overhand throws and  alternating posts.  We then created a suture bridge utilizing two  Arthrex push lock suture anchors and these were then impacted obtaining  good stability and nicely reinforcing the rotator cuff repair compressing the free margin rotator cuff against the bony bed on the   tuberosity.  Suture limbs were then clipped.  At this point final  inspection and irrigation was completed.  Hemostasis was obtained.  Following this instruments was removed. Ports closed with Monocryl and  Steri-Strips.  Bulky dressing taped on the right shoulder and right arm  was placed sling immobilizer. The patient was placed supine, extubated  and taken recovery room in stable condition.      Vania Rea. Supple, M.D.  Electronically Signed     KMS/MEDQ  D:  12/24/2005  T:  12/24/2005  Job:  [id number]

## 2011-04-09 ENCOUNTER — Other Ambulatory Visit: Payer: Self-pay | Admitting: Specialist

## 2011-04-09 DIAGNOSIS — M5126 Other intervertebral disc displacement, lumbar region: Secondary | ICD-10-CM

## 2011-04-12 ENCOUNTER — Ambulatory Visit
Admission: RE | Admit: 2011-04-12 | Discharge: 2011-04-12 | Disposition: A | Discharge status: Home / Self Care | Payer: Federal, State, Local not specified - PPO | Source: Ambulatory Visit | Attending: Specialist | Admitting: Specialist

## 2011-04-12 DIAGNOSIS — M5126 Other intervertebral disc displacement, lumbar region: Secondary | ICD-10-CM

## 2011-04-21 ENCOUNTER — Other Ambulatory Visit: Payer: Self-pay | Admitting: Specialist

## 2011-04-21 DIAGNOSIS — M25551 Pain in right hip: Secondary | ICD-10-CM

## 2011-04-23 ENCOUNTER — Ambulatory Visit
Admission: RE | Admit: 2011-04-23 | Discharge: 2011-04-23 | Disposition: A | Discharge status: Home / Self Care | Payer: Federal, State, Local not specified - PPO | Source: Ambulatory Visit | Attending: Specialist | Admitting: Specialist

## 2011-04-23 DIAGNOSIS — M25551 Pain in right hip: Secondary | ICD-10-CM

## 2011-05-12 ENCOUNTER — Encounter (INDEPENDENT_AMBULATORY_CARE_PROVIDER_SITE_OTHER): Payer: Self-pay | Admitting: Surgery

## 2011-05-29 ENCOUNTER — Encounter (INDEPENDENT_AMBULATORY_CARE_PROVIDER_SITE_OTHER): Payer: Self-pay | Admitting: Surgery

## 2011-05-29 ENCOUNTER — Ambulatory Visit (INDEPENDENT_AMBULATORY_CARE_PROVIDER_SITE_OTHER): Payer: Federal, State, Local not specified - PPO | Admitting: Surgery

## 2011-05-29 VITALS — BP 158/100 | HR 56 | Temp 97.1°F | Resp 14 | Ht 76.0 in | Wt 321.6 lb

## 2011-05-29 DIAGNOSIS — Q792 Exomphalos: Secondary | ICD-10-CM | POA: Insufficient documentation

## 2011-05-29 DIAGNOSIS — K4020 Bilateral inguinal hernia, without obstruction or gangrene, not specified as recurrent: Secondary | ICD-10-CM | POA: Insufficient documentation

## 2011-05-29 DIAGNOSIS — K429 Umbilical hernia without obstruction or gangrene: Secondary | ICD-10-CM

## 2011-05-29 HISTORY — DX: Bilateral inguinal hernia, without obstruction or gangrene, not specified as recurrent: K40.20

## 2011-05-29 NOTE — Patient Instructions (Signed)
Call [phone] and ask for our surgery schedulers to schedule your surgery.

## 2011-05-29 NOTE — Progress Notes (Signed)
Patient ID: Brandon [name], male   DOB: 03/05/1950, 60 y.o.   MRN: [id number]  Chief Complaint  Patient presents with  . New Evaluation    New Pt. Eval BIL    HPI Brandon [name] is a 61 y.o. male.  Referred by Dr. Worthy Rancher for bilateral inguinal hernias HPI This is a 61 year old male who presents with a two-year history of intermittent severe right anterior hip pain with radiation down into his groin. This pain was fairly severe. He had a visit to the emergency department including a CT scan in early 2012. This showed some signs of arthritis in the hips. On my review of the CT scan there were also bilateral inguinal hernias as well as an umbilical hernia but this was not mentioned on the CT scan report. He has been seeing Dr. [name] for his hip pain. An MRI was obtained in April of 2013 which showed some degenerative changes of both hips as well as bilateral inguinal hernias containing only fat. These have enlarged slightly compared to the last scan. The patient is now referred for surgical evaluation. Past Medical History  Diagnosis Date  . Arthritis   . Pain in joint, pelvic region and thigh   . Anxiety   . Hypertension   . Osteoporosis   . Sleep apnea   . Cataract   . Blood transfusion     Past Surgical History  Procedure Date  . Knee arthroscopy     left  . Colonoscopy w/ polypectomy   . Rotator cuff repair     right  . Tonsillectomy   . Cataract extraction     Family History  Problem Relation Age of Onset  . Cancer Father     bone  . Cancer Paternal Grandmother     ovarian    Social History History  Substance Use Topics  . Smoking status: Former Games developer  . Smokeless tobacco: Not on file  . Alcohol Use: Yes     ocass    No Known Allergies  Current Outpatient Prescriptions  Medication Sig Dispense Refill  . ATENOLOL PO Take by mouth.      Marland Kitchen atenolol-chlorthalidone (TENORETIC) 100-25 MG per tablet Take 1 tablet by mouth daily.      Marland Kitchen escitalopram (LEXAPRO) 20 MG  tablet Take 20 mg by mouth daily.      . Escitalopram Oxalate (LEXAPRO PO) Take by mouth.      . gabapentin (NEURONTIN) 300 MG capsule Take 300 mg by mouth 3 (three) times daily.      Marland Kitchen HYDROcodone-acetaminophen (NORCO) 5-325 MG per tablet       . oxyCODONE (OXYCONTIN) 10 MG 12 hr tablet Take 10 mg by mouth every 12 (twelve) hours.      . potassium chloride SA (K-DUR,KLOR-CON) 20 MEQ tablet Take 20 mEq by mouth 2 (two) times daily.      . traZODone (DESYREL) 50 MG tablet        He is no longer taking the Neurontin or oxycontin  Review of Systems Review of Systems  Constitutional: Negative for fever, chills and unexpected weight change.  HENT: Negative for hearing loss, congestion, sore throat, trouble swallowing and voice change.   Eyes: Negative for visual disturbance.  Respiratory: Negative for cough and wheezing.   Cardiovascular: Negative for chest pain, palpitations and leg swelling.  Gastrointestinal: Positive for abdominal pain. Negative for nausea, vomiting, diarrhea, constipation, blood in stool, abdominal distention, anal bleeding and rectal pain.  Genitourinary: Negative for  hematuria and difficulty urinating.  Musculoskeletal: Negative for arthralgias.  Skin: Negative for rash and wound.  Neurological: Negative for seizures, syncope, weakness and headaches.  Hematological: Negative for adenopathy. Does not bruise/bleed easily.  Psychiatric/Behavioral: Negative for confusion.    Blood pressure 158/100, pulse 56, temperature 97.1 F (36.2 C), temperature source Temporal, resp. rate 14, height 6\' 4"  (1.93 m), weight 321 lb 9.6 oz (145.877 kg).  Physical Exam Physical Exam WDWN in NAD HEENT:  EOMI, sclera anicteric Neck:  No masses, no thyromegaly Lungs:  CTA bilaterally; normal respiratory effort CV:  Regular rate and rhythm; no murmurs Abd:  +bowel sounds, soft, non-tender, small umbilical hernia - nonreducible GU;  Bilateral descended testes; palpable hernias on both  sides - reducible;  Ext:  Well-perfused; no edema Skin:  Warm, dry; no sign of jaundice  Data Reviewed RADIOLOGY REPORT*  Clinical Data: Right hip pain for 6 weeks. No acute injury or  prior relevant surgery on the right.  MRI OF THE RIGHT HIP WITHOUT CONTRAST  Technique: Multiplanar, multisequence MR imaging was performed. No  intravenous contrast was administered.  Comparison: Right hip MRI 02/09/2010 and radiographs 02/08/2010.  Findings: There are stable fairly symmetric degenerative changes of  both hips with osteophytes and subchondral cyst formation in both  acetabula. There may be a tiny posterior paralabral cyst on the  left, unchanged. There is no evidence of acute fracture,  dislocation or femoral head osteonecrosis.  There is stable post-traumatic deformity of the proximal left  femur. The visualized bony pelvis and sacroiliac joints appear  normal. The gluteus, hamstring and iliopsoas tendons appear intact.  Bilateral inguinal hernias containing fat have mildly enlarged  compared with the prior MRI. There is no herniated bowel or  significant inflammatory change. The visualized internal pelvic  contents appear unremarkable. Small external iliac lymph nodes are  unchanged.  IMPRESSION:  1. Stable degenerative changes at both hips. No acute or  lateralizing findings identified.  2. Stable post-traumatic deformity of the proximal left femur.  3. Slightly increased bilateral inguinal hernias containing only  fat.  Original Report Authenticated By: Gerrianne Scale, M.D 04/23/11  CT scan from 02/08/10 RADIOLOGY REPORT*  Clinical Data: Right hip pain, back and abdomen pain for 6 days.  CT ABDOMEN AND PELVIS WITH CONTRAST  Technique: Multidetector CT imaging of the abdomen and pelvis was  performed using the standard protocol during bolus administration  of intravenous contrast.  Contrast: 125 ml Omnipaque-300.  Comparison: Hip x-ray earlier today.  Findings: Liver,  spleen, pancreas, adrenal glands all normal. No  renal masses, but there are bilateral nonobstructing intrarenal  calculi observed. Gallbladder unremarkable by CT. No biliary  ductal dilation. Stomach and visualized large and small bowel  unremarkable. Abdominal aorta normal in caliber. No significant  lymphadenopathy. No free fluid. Minimal right lung base  atelectasis. No significant pleural effusion.  Appendix identified and normal. Visualized colon and small bowel  unremarkable. Diverticulosis without diverticulitis. No free  fluid. Prostate and seminal vesicles normal for age. Scattered  phleboliths. No obstructive uropathy. Visualized osseous  structures, including the pelvic bones and acetabulum grossly  unremarkable. Degenerative change both hips with joint space  narrowing. No significant lymphadenopathy. Urinary bladder normal.  IMPRESSION: No acute CT abdomen pelvis findings. Nephrolithiasis  without obstructive uropathy. Mild diverticulosis. Minimal right  lung base atelectasis of uncertain significance. Bilateral hip DJD  without focal destructive lesion.  Original Report Authenticated By: Elsie Stain, M.D.   Assessment    Enlarging bilateral  inguinal hernias Umbilical hernia    Plan    Laparoscopic bilateral inguinal hernia repairs with mesh Umbilical hernia repair with mesh The surgical procedure has been discussed with the patient.  Potential risks, benefits, alternative treatments, and expected outcomes have been explained.  All of the patient's questions at this time have been answered.  The likelihood of reaching the patient's treatment goal is good.  The patient understand the proposed surgical procedure and wishes to proceed.        Kamisha Ell K. 05/29/2011, 5:33 PM

## 2011-07-06 DIAGNOSIS — K429 Umbilical hernia without obstruction or gangrene: Secondary | ICD-10-CM

## 2011-07-06 DIAGNOSIS — K4020 Bilateral inguinal hernia, without obstruction or gangrene, not specified as recurrent: Secondary | ICD-10-CM

## 2011-07-06 HISTORY — PX: HERNIA REPAIR: SHX51

## 2011-07-29 ENCOUNTER — Ambulatory Visit (INDEPENDENT_AMBULATORY_CARE_PROVIDER_SITE_OTHER): Payer: Federal, State, Local not specified - PPO | Admitting: Surgery

## 2011-07-29 ENCOUNTER — Encounter (INDEPENDENT_AMBULATORY_CARE_PROVIDER_SITE_OTHER): Payer: Self-pay | Admitting: Surgery

## 2011-07-29 VITALS — BP 143/84 | HR 78 | Temp 98.6°F | Resp 18 | Ht 76.0 in | Wt 316.8 lb

## 2011-07-29 DIAGNOSIS — K4020 Bilateral inguinal hernia, without obstruction or gangrene, not specified as recurrent: Secondary | ICD-10-CM

## 2011-07-29 DIAGNOSIS — K429 Umbilical hernia without obstruction or gangrene: Secondary | ICD-10-CM

## 2011-07-29 NOTE — Progress Notes (Signed)
Status post laparoscopic repair of bilateral indirect inguinal hernias and a small umbilical hernia repaired using open technique. He is doing quite well. He only had pain for 2 days. No swelling. His hip actually is feeling better. On examination all incisions are well healed with no sign of infection. He has some scar tissue under his top laparoscopic port site. No sign of hernia here. No sign of a little hernia on either side. No sign of umbilical hernia. He may begin increasing his level of activity. He may resume swimming. Followup p.r.n.  Wilmon Arms. Corliss Skains, MD, [facility]  07/29/2011 2:27 PM

## 2011-08-09 IMAGING — RF DG MYELOGRAM LUMBAR
12 of 16 series · 12 of 16 positions shown · non-contrast
Comparison: none

CLINICAL DATA: Low back pain.  Right groin pain.
TECHNIQUE: Contiguous axial images were obtained through the lumbar
spine without infusion. Coronal, sagittal, and disc space
reconstructions were obtained of the axial image sets.

[Series 2: (hospital) · 1 of 1 slices shown]
[im 1/1]
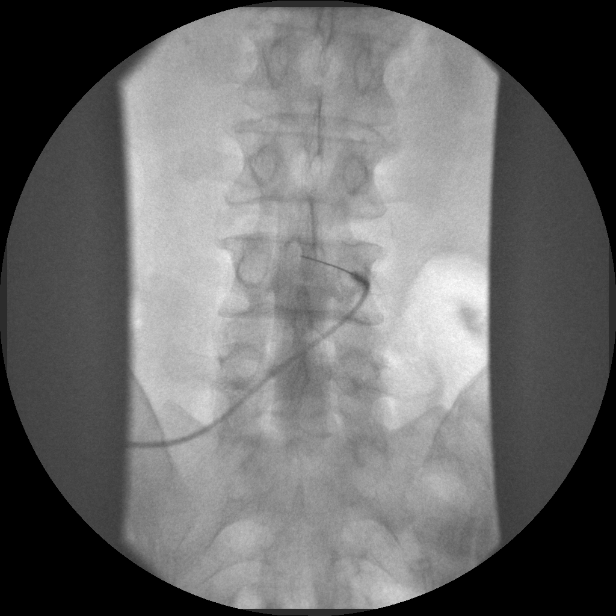

[Series 4: myelogram  white · 1 of 1 slices shown (1 of 11)]
[im 1/1]
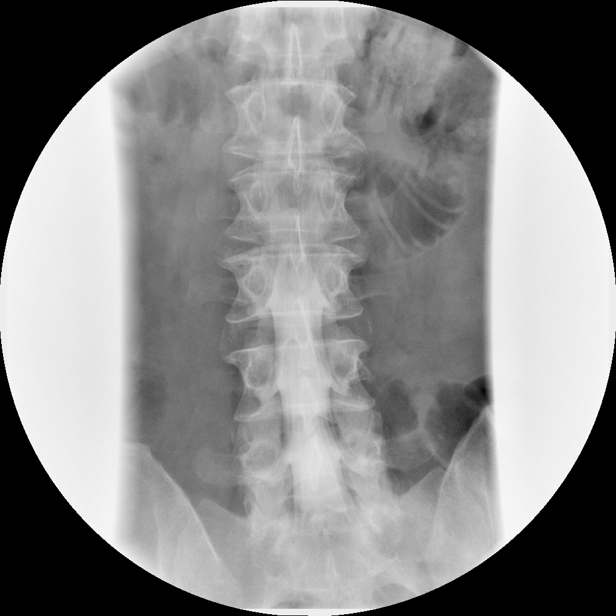

[Series 5: myelogram  white · 1 of 1 slices shown (2 of 11)]
[im 1/1]
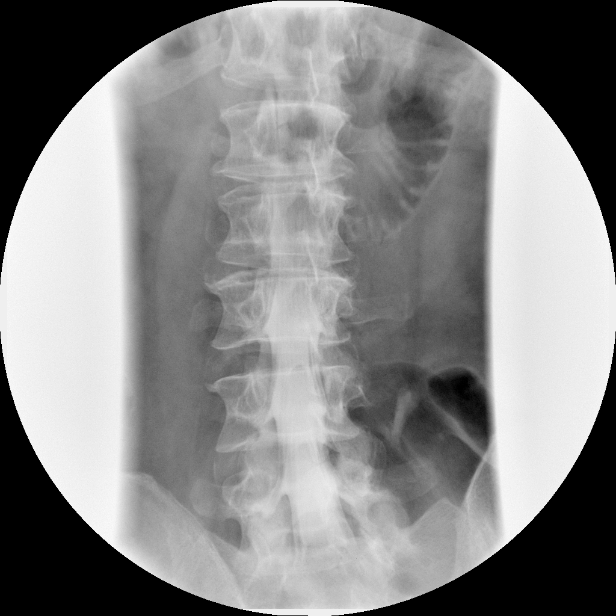

[Series 6: myelogram  white · 1 of 1 slices shown (3 of 11)]
[im 1/1]
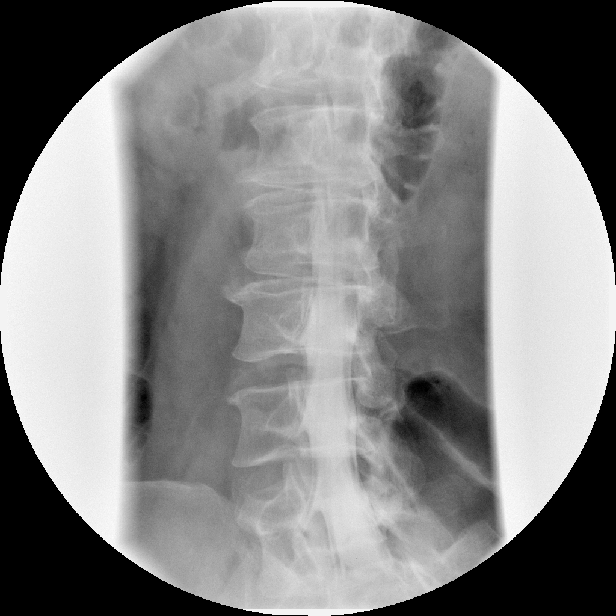

[Series 8: myelogram  white · 1 of 1 slices shown (4 of 11)]
[im 1/1]
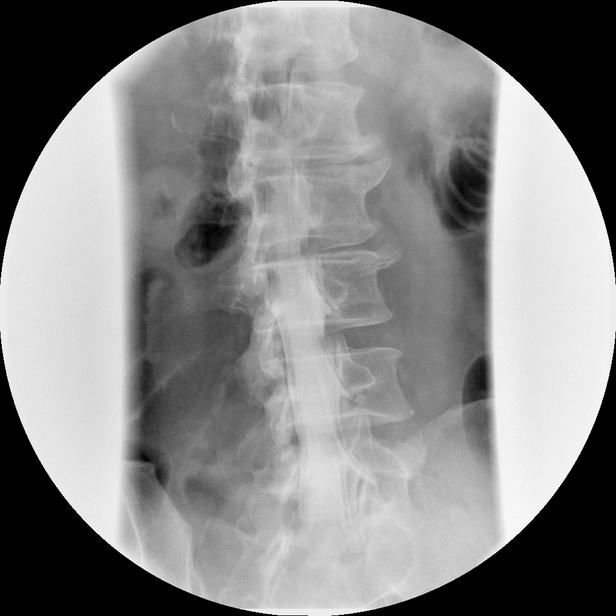

[Series 9: myelogram  white · 1 of 1 slices shown (5 of 11)]
[im 1/1]
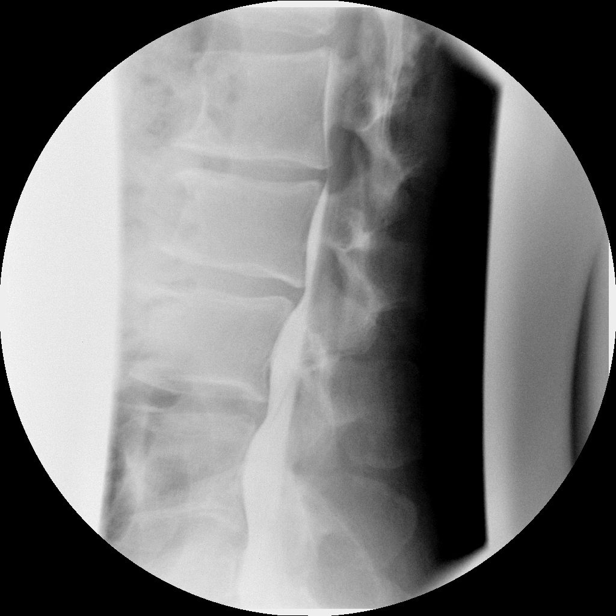

[Series 10: myelogram  white · 1 of 1 slices shown (6 of 11)]
[im 1/1]
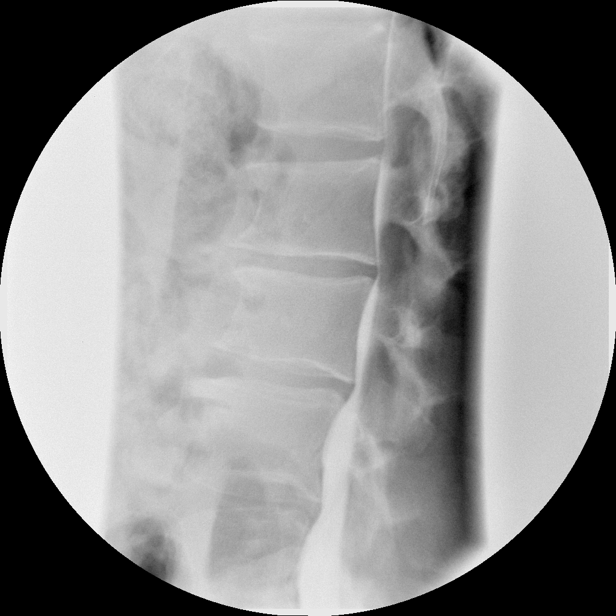

[Series 12: myelogram  white · 1 of 1 slices shown (7 of 11)]
[im 1/1]
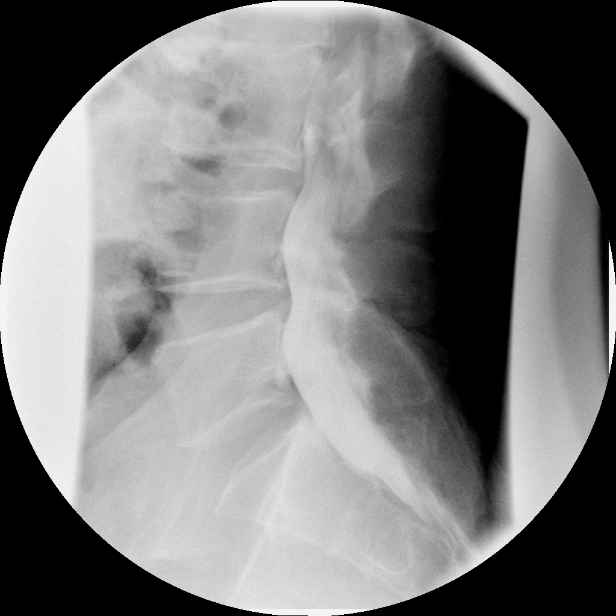

[Series 13: myelogram  white · 1 of 1 slices shown (8 of 11)]
[im 1/1]
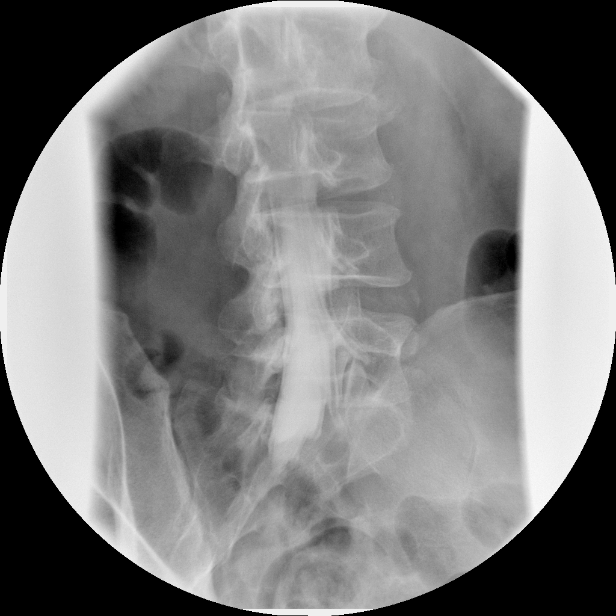

[Series 14: myelogram  white · 1 of 1 slices shown (9 of 11)]
[im 1/1]
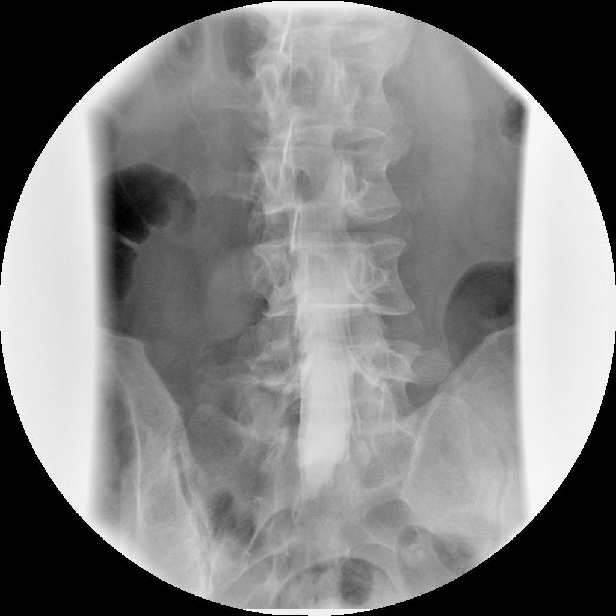

[Series 16: myelogram  white · 1 of 1 slices shown (10 of 11)]
[im 1/1]
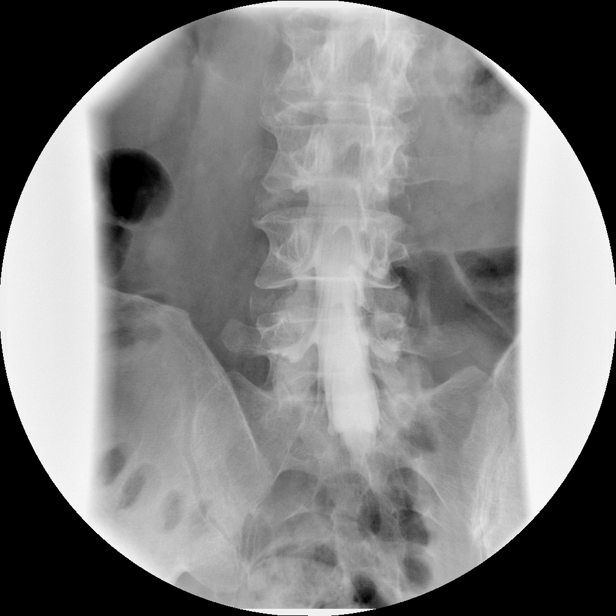

[Series 17: myelogram  white · 1 of 1 slices shown (11 of 11)]
[im 1/1]
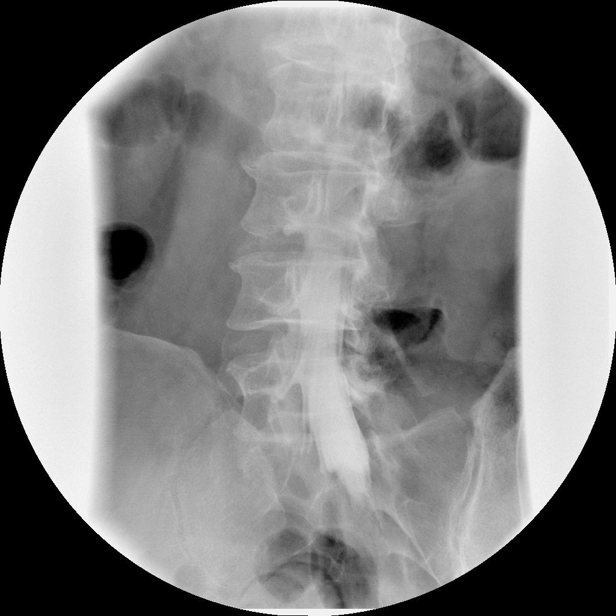

[12 of 16 positions shown; findings below may reference images not displayed]

LUMBAR MYELOGRAM

Procedure: After thorough discussion of risks and benefits of the
procedure including bleeding, infection, injury to nerves, blood
vessels, adjacent structures as well as headache and CSF leak,
written and oral informed consent was obtained.   Consent was
obtained by Dr.Shepu.

Patient was positioned prone on the fluoroscopy table. Local
anesthesia was provided with 1% lidocaine without epinephrine after
prepped and draped in the usual sterile fashion. Puncture was
performed at L3-L4 using a three [DATE]-inch [employer] 22-gauge spinal
needle via right paramedian approach.  Using a single pass through
the dura, the needle was placed within the thecal sac, with return
of clear CSF. 15 mL of Ymnipaque-C9S was injected into the thecal
sac, with normal opacification of the nerve roots and cauda equina
consistent with free flow within the subarachnoid space.

Fluoroscopy time: 1 minute 10 seconds
FINDINGS: There is mild levoconvex curvature with the apex at L2-
L3.  Disc space loss is present at L1-L2, L2-L3 and to a lesser
extent at L3-L4.  There is a mild  impression on the descending
right S1 nerve root sleeve suggesting some lateral recess stenosis.

In the neutral position, the alignment shows minimal grade 1
retrolisthesis of L2 on L3.  This is unchanged with flexion.  There
is no motion at other levels.  Shallow anterior extradural
impressions are present at L3-L4 and L4-L5.

With right lateral bending there is no definite effacement of
contrast within the nerve root sleeves.  The right S1 nerve root
sleeve appears within normal limits on this frontal view.  With
left lateral bending, there is straightening of the spine without
significant dextroconvex curvature.
IMPRESSION: 1.  Technically successful lumbar puncture for lumbar myelogram.
2.  L1-L2 at L2-L3 predominant lumbar spine degenerative disease.
3.  Probable small impression on the right S1 nerve root sleeve in
the lateral recess at L5-S1.
4.  Limited leftward bending limited leftward bending the strings
the mild levoconvex curvature.
5.  Trace grade 1 retrolisthesis of L2 on L3.

CT LUMBAR MYELOGRAM
FINDINGS: Paraspinal soft tissues appear within normal limits.
Right greater than left SI joint degenerative disease is present.
Spinal cord terminates posterior to the L1 vertebra.  There is no
significant curvature at the time of CT scanning.  Trace
retrolisthesis of L2 on L3 is unchanged.

T12-L1:  Mild moderate bilateral facet arthrosis with calcification
of the ligamentum flavum on the left.  Ligamentum flavum redundancy
is present.  Mild central stenosis predominately associated with
posterior element hypertrophy.  There is a small  ossicle inferior
to the T12 inferior articular process on coronal image number 41,
consistent with a small remote fracture of the inferior articular
process on the left.  This may account for ligamentum flavum
calcification as well.

L1-L2:  Degenerated disc with endplate osteophytes.  Faint
calcification of the disc.  Shallow broad-based posterior disc
bulge extending into the left neural foramen without significant
foraminal, lateral recess and only minimal central stenosis.

L2-L3:  Trace retrolisthesis.  Mild bilateral facet degenerative
disease.  Central canal and lateral recesses appear patent.
Foramina appear adequately patent as well.

L3-L4:  Degenerated disc with circumferential disc bulge.  Annular
calcification.  Short pedicles are present.  Central canal and
lateral recesses patent.  Mild bilateral facet degeneration.
Lateral disc bulging and osteophyte contacts the exiting left L3
nerve root.  Mild symmetric bilateral foraminal stenosis associated
with short pedicles.

L4-L5:  Mild bilateral facet degeneration, right greater than left.
Spinal canal and lateral recesses are patent.  Short  pedicles are
present with mild symmetric bilateral foraminal stenosis.
Circumferential disc bulge extends into both neural foramina
contributing to mild bilateral foraminal stenosis.

L5-S1:  Mild moderate bilateral facet arthrosis.  Spinal canal and
lateral recesses appear adequately patent.  There is no impingement
on the right S1 nerve root in the lateral recess as suggested on
myelography.  Relative preservation of disc height with shallow
disc bulging.  Foramina appear patent.
IMPRESSION: 1.  Mild multilevel lumbar spondylosis most pronounced at L1-L2 and
L2-L3.
2.  Trace retrolisthesis of L2 on L3 with broad-based disc bulge.
3.  Short pedicles are present with superimposed degenerative disc
disease, producing symmetric multilevel foraminal stenosis in the
mid to lower lumbar spine.  No discrete right-sided lesions are
identified to account for right-sided groin pain.
4.  Small accessory ossicle at T12-L1 left facet joint suggest
remote trauma.

## 2011-09-15 ENCOUNTER — Encounter (INDEPENDENT_AMBULATORY_CARE_PROVIDER_SITE_OTHER): Payer: Federal, State, Local not specified - PPO | Admitting: Ophthalmology

## 2011-09-15 ENCOUNTER — Ambulatory Visit: Admit: 2011-09-15 | Payer: Self-pay | Admitting: Ophthalmology

## 2011-09-15 DIAGNOSIS — H3560 Retinal hemorrhage, unspecified eye: Secondary | ICD-10-CM

## 2011-09-15 DIAGNOSIS — H43819 Vitreous degeneration, unspecified eye: Secondary | ICD-10-CM

## 2011-09-15 SURGERY — SCLERAL BUCKLE
Anesthesia: General | Laterality: Right

## 2011-10-13 ENCOUNTER — Encounter (INDEPENDENT_AMBULATORY_CARE_PROVIDER_SITE_OTHER): Payer: Federal, State, Local not specified - PPO | Admitting: Ophthalmology

## 2011-10-27 ENCOUNTER — Encounter (INDEPENDENT_AMBULATORY_CARE_PROVIDER_SITE_OTHER): Payer: Federal, State, Local not specified - PPO | Admitting: Ophthalmology

## 2011-10-27 DIAGNOSIS — H43819 Vitreous degeneration, unspecified eye: Secondary | ICD-10-CM

## 2011-10-27 DIAGNOSIS — H2510 Age-related nuclear cataract, unspecified eye: Secondary | ICD-10-CM

## 2012-02-21 ENCOUNTER — Encounter (HOSPITAL_COMMUNITY): Payer: Self-pay

## 2012-02-21 ENCOUNTER — Emergency Department (HOSPITAL_COMMUNITY): Payer: Federal, State, Local not specified - PPO

## 2012-02-21 ENCOUNTER — Emergency Department (HOSPITAL_COMMUNITY)
Admission: EM | Admit: 2012-02-21 | Discharge: 2012-02-21 | Disposition: A | Discharge status: Home / Self Care | Payer: Federal, State, Local not specified - PPO | Attending: Emergency Medicine | Admitting: Emergency Medicine

## 2012-02-21 DIAGNOSIS — Z87891 Personal history of nicotine dependence: Secondary | ICD-10-CM | POA: Insufficient documentation

## 2012-02-21 DIAGNOSIS — I10 Essential (primary) hypertension: Secondary | ICD-10-CM | POA: Insufficient documentation

## 2012-02-21 DIAGNOSIS — Z9889 Other specified postprocedural states: Secondary | ICD-10-CM | POA: Insufficient documentation

## 2012-02-21 DIAGNOSIS — Z8781 Personal history of (healed) traumatic fracture: Secondary | ICD-10-CM | POA: Insufficient documentation

## 2012-02-21 DIAGNOSIS — Z8669 Personal history of other diseases of the nervous system and sense organs: Secondary | ICD-10-CM | POA: Insufficient documentation

## 2012-02-21 DIAGNOSIS — G8929 Other chronic pain: Secondary | ICD-10-CM | POA: Insufficient documentation

## 2012-02-21 DIAGNOSIS — M25552 Pain in left hip: Secondary | ICD-10-CM

## 2012-02-21 DIAGNOSIS — IMO0002 Reserved for concepts with insufficient information to code with codable children: Secondary | ICD-10-CM | POA: Insufficient documentation

## 2012-02-21 DIAGNOSIS — Z8739 Personal history of other diseases of the musculoskeletal system and connective tissue: Secondary | ICD-10-CM | POA: Insufficient documentation

## 2012-02-21 DIAGNOSIS — M25559 Pain in unspecified hip: Secondary | ICD-10-CM | POA: Insufficient documentation

## 2012-02-21 DIAGNOSIS — F411 Generalized anxiety disorder: Secondary | ICD-10-CM | POA: Insufficient documentation

## 2012-02-21 DIAGNOSIS — Z79899 Other long term (current) drug therapy: Secondary | ICD-10-CM | POA: Insufficient documentation

## 2012-02-21 DIAGNOSIS — R52 Pain, unspecified: Secondary | ICD-10-CM | POA: Insufficient documentation

## 2012-02-21 DIAGNOSIS — G4730 Sleep apnea, unspecified: Secondary | ICD-10-CM | POA: Insufficient documentation

## 2012-02-21 MED ORDER — ONDANSETRON 4 MG PO TBDP
4.0000 mg | ORAL_TABLET | Freq: Once | ORAL | Status: AC
  Administered 2012-02-21: 4 mg via ORAL
  Filled 2012-02-21: qty 1

## 2012-02-21 MED ORDER — HYDROMORPHONE HCL PF 1 MG/ML IJ SOLN
1.0000 mg | Freq: Once | INTRAMUSCULAR | Status: AC
  Administered 2012-02-21: 1 mg via INTRAVENOUS
  Filled 2012-02-21: qty 1

## 2012-02-21 MED ORDER — HYDROMORPHONE HCL PF 2 MG/ML IJ SOLN
2.0000 mg | Freq: Once | INTRAMUSCULAR | Status: AC
  Administered 2012-02-21: 2 mg via INTRAMUSCULAR
  Filled 2012-02-21: qty 1

## 2012-02-21 MED ORDER — DIPHENHYDRAMINE HCL 50 MG/ML IJ SOLN
12.5000 mg | Freq: Once | INTRAMUSCULAR | Status: AC
  Administered 2012-02-21: 12.5 mg via INTRAVENOUS
  Filled 2012-02-21: qty 1

## 2012-02-21 MED ORDER — PERCOCET 5-325 MG PO TABS: 1.0000 | ORAL_TABLET | Freq: Four times a day (QID) | ORAL | Status: DC | PRN

## 2012-02-21 MED ORDER — KETOROLAC TROMETHAMINE 30 MG/ML IJ SOLN
30.0000 mg | Freq: Once | INTRAMUSCULAR | Status: AC
  Administered 2012-02-21: 30 mg via INTRAVENOUS
  Filled 2012-02-21: qty 1

## 2012-02-21 MED ORDER — DIAZEPAM 5 MG PO TABS
5.0000 mg | ORAL_TABLET | Freq: Once | ORAL | Status: AC
  Administered 2012-02-21: 5 mg via ORAL
  Filled 2012-02-21: qty 1

## 2012-02-21 MED ORDER — NAPROXEN 500 MG PO TABS: 500.0000 mg | ORAL_TABLET | Freq: Two times a day (BID) | ORAL | Status: DC

## 2012-02-21 MED ORDER — DIAZEPAM 5 MG PO TABS: 5.0000 mg | ORAL_TABLET | Freq: Four times a day (QID) | ORAL | Status: DC | PRN

## 2012-02-21 NOTE — ED Notes (Signed)
Patient transported to CT and returned 

## 2012-02-21 NOTE — ED Notes (Signed)
Per EMS- patient ws working in his yard a week ago and 3 days ago he has not been able to ambulate due to pain.

## 2012-02-21 NOTE — ED Provider Notes (Signed)
History     CSN: [id number]  Arrival date & time 02/21/12  1805   First MD Initiated Contact with Patient 02/21/12 2005      No chief complaint on file.   (Consider location/radiation/quality/duration/timing/severity/associated sxs/prior treatment) HPI Comments: Brandon [name] is a 62 y.o. male with a history of arthritis and chronic hip pain from fracture that occurred 20 years ago (patient currently followed by Dr. Adria Dill orthopedics) presents emergency department with an acute on chronic pain exacerbation of left hip.  Patient reports that last week he was doing some yard work which he believes could have caused a stress fracture as he has had worsened pain of his left hip.  Patient states he is unable to bear weight due to pain.  He called his orthopedic office and had an appointment scheduled for Monday, however the pain has gradually been worsening to becoming unbearable today.  Patient denies any numbness, tingling,or weakness of the left lower extremity.  He denies any weight loss, fevers, night sweats, chills,significant injury or trauma.  No other complaints at this time.  The history is provided by the patient.    Past Medical History  Diagnosis Date  . Arthritis   . Pain in joint, pelvic region and thigh   . Anxiety   . Hypertension   . Osteoporosis   . Sleep apnea   . Cataract   . Blood transfusion     Past Surgical History  Procedure Laterality Date  . Knee arthroscopy      left  . Colonoscopy w/ polypectomy    . Rotator cuff repair      right  . Tonsillectomy    . Cataract extraction    . Hernia repair  07/06/2011    Family History  Problem Relation Age of Onset  . Cancer Father     bone  . Cancer Paternal Grandmother     ovarian    History  Substance Use Topics  . Smoking status: Former Games developer  . Smokeless tobacco: Not on file  . Alcohol Use: Yes     Comment: ocass      Review of Systems  All other systems reviewed and are  negative.    Allergies  Review of patient's allergies indicates no known allergies.  Home Medications   Current Outpatient Rx  Name  Route  Sig  Dispense  Refill  . amLODipine (NORVASC) 5 MG tablet   Oral   Take 5 mg by mouth daily.         Marland Kitchen atenolol-chlorthalidone (TENORETIC) 100-25 MG per tablet   Oral   Take 1 tablet by mouth daily.         Marland Kitchen escitalopram (LEXAPRO) 20 MG tablet   Oral   Take 20 mg by mouth daily.         Marland Kitchen ibuprofen (ADVIL,MOTRIN) 200 MG tablet   Oral   Take 800 mg by mouth every 6 (six) hours as needed for pain.         . potassium chloride SA (K-DUR,KLOR-CON) 20 MEQ tablet   Oral   Take 20 mEq by mouth 2 (two) times daily.           BP 146/87  Pulse 54  Temp(Src) 98.3 F (36.8 C) (Oral)  Resp 18  SpO2 95%  Physical Exam  Nursing note and vitals reviewed. Constitutional: He appears well-developed and well-nourished. No distress.  HENT:  Head: Normocephalic and atraumatic.  Eyes: Conjunctivae and EOM are normal.  Neck: Normal range of motion. Neck supple.  Cardiovascular:  Intact distal pulses, capillary refill < 3 seconds  Musculoskeletal:  Tenderness to palpation over proximal femur and left trochanteric region.  Pain with internal and external rotation of left lower extremity  Unable to bear weight due to pain.All other extremities with normal ROM  Neurological:  No sensory deficit  Skin: He is not diaphoretic.  Skin intact, no tenting    ED Course  Procedures (including critical care time)  Labs Reviewed - No data to display Dg Hip Complete Left  02/21/2012  *RADIOLOGY REPORT*  Clinical Data: left hip pain for 3 days.  History of femur fracture 30 years ago.  LEFT HIP - COMPLETE 2+ VIEW  Comparison: None.  Findings: Bilateral hip osteoarthritis is present, greater on the right than left.  Abnormal appearance of the proximal left femur compatible with malunion of the old fracture. The proximal femur almost has a  shepherd's crook deformity.  Left coxa varum is present.  There is no acute osseous abnormality.  The pelvic rings appear intact.  IMPRESSION: No acute abnormality.  Right greater than left hip osteoarthritis. Proximal left femoral shaft malunion.   Original Report Authenticated By: Andreas Newport, M.D.    Ct Hip Left Wo Contrast  02/21/2012  *RADIOLOGY REPORT*  Clinical Data: Left hip pain  CT OF THE LEFT HIP WITHOUT CONTRAST  Technique:  Multidetector CT imaging was performed according to the standard protocol. Multiplanar CT image reconstructions were also generated.  Comparison: 02/21/2012 radiograph  Findings: The visualized soft tissue show a fat containing left inguinal hernia.  Sigmoid colonic diverticulosis.  Left hemiscrotum hydrocele.  Mild left femoral acetabular degenerative change.  Rami intact. Deformity of the proximal left femoral shaft, in keeping with sequelae of prior fracture, with coxa vara.  No displaced acute fracture is identified.  No dislocation.  IMPRESSION: Proximal left femoral shaft deformity keeping with sequelae of prior fracture.  No displaced acute fracture is identified.  MRI has increased sensitivity for stress fracture/nondisplaced fracture if clinical concern persists.   Original Report Authenticated By: Jearld Lesch, M.D.      No diagnosis found.    MDM  Acute on chronic left hip pain  62 year old male presenting to the emergency department complaining of left hip pain and inability to bear weight.  Imaging reviewed showing proximal left femoral shaft malunion from previous fracture that occurred 20 years ago.  CT shows no displaced acute fracture identified.  Cannot rule out stress fracture, however patient is able to bear weight after pain medications given.  Orthopedic followup on Monday has argued been scheduled with patient's orthopedic Dr. [name].  Patient neurovascularly intact on physical exam and will be discharged with pain medications and strict  return precautions.        Brandon [name], [place] 02/21/12 2229

## 2012-02-21 NOTE — ED Notes (Signed)
EAV:[id number]> Expected date:02/21/12<BR> Expected time:<BR> Means of arrival:<BR> Comments:<BR> HTN non ambulatory

## 2012-02-21 NOTE — ED Provider Notes (Signed)
Medical screening examination/treatment/procedure(s) were performed by non-physician practitioner and as supervising physician I was immediately available for consultation/collaboration.  Raeford Razor, MD 02/21/12 2330

## 2012-03-05 DIAGNOSIS — I2699 Other pulmonary embolism without acute cor pulmonale: Secondary | ICD-10-CM

## 2012-03-05 HISTORY — DX: Other pulmonary embolism without acute cor pulmonale: I26.99

## 2012-03-15 ENCOUNTER — Other Ambulatory Visit: Payer: Self-pay | Admitting: Neurosurgery

## 2012-03-15 DIAGNOSIS — M545 Low back pain, unspecified: Secondary | ICD-10-CM

## 2012-03-16 ENCOUNTER — Inpatient Hospital Stay (HOSPITAL_COMMUNITY)
Admission: EM | Admit: 2012-03-16 | Discharge: 2012-03-22 | DRG: 544 | Disposition: A | Discharge status: Home / Self Care | Payer: Federal, State, Local not specified - PPO | Attending: Family Medicine | Admitting: Family Medicine

## 2012-03-16 ENCOUNTER — Emergency Department (HOSPITAL_COMMUNITY): Payer: Federal, State, Local not specified - PPO

## 2012-03-16 ENCOUNTER — Inpatient Hospital Stay (HOSPITAL_COMMUNITY): Payer: Federal, State, Local not specified - PPO

## 2012-03-16 ENCOUNTER — Encounter (HOSPITAL_COMMUNITY): Payer: Self-pay | Admitting: *Deleted

## 2012-03-16 DIAGNOSIS — F419 Anxiety disorder, unspecified: Secondary | ICD-10-CM

## 2012-03-16 DIAGNOSIS — I2699 Other pulmonary embolism without acute cor pulmonale: Secondary | ICD-10-CM

## 2012-03-16 DIAGNOSIS — I10 Essential (primary) hypertension: Secondary | ICD-10-CM

## 2012-03-16 DIAGNOSIS — G894 Chronic pain syndrome: Secondary | ICD-10-CM | POA: Diagnosis present

## 2012-03-16 DIAGNOSIS — M810 Age-related osteoporosis without current pathological fracture: Secondary | ICD-10-CM | POA: Diagnosis present

## 2012-03-16 DIAGNOSIS — G8929 Other chronic pain: Secondary | ICD-10-CM

## 2012-03-16 DIAGNOSIS — Y998 Other external cause status: Secondary | ICD-10-CM

## 2012-03-16 DIAGNOSIS — Z86711 Personal history of pulmonary embolism: Secondary | ICD-10-CM | POA: Diagnosis present

## 2012-03-16 DIAGNOSIS — Y9210 Unspecified residential institution as the place of occurrence of the external cause: Secondary | ICD-10-CM | POA: Diagnosis not present

## 2012-03-16 DIAGNOSIS — S60219A Contusion of unspecified wrist, initial encounter: Secondary | ICD-10-CM | POA: Diagnosis not present

## 2012-03-16 DIAGNOSIS — M79605 Pain in left leg: Secondary | ICD-10-CM

## 2012-03-16 DIAGNOSIS — IMO0002 Reserved for concepts with insufficient information to code with codable children: Secondary | ICD-10-CM | POA: Diagnosis present

## 2012-03-16 DIAGNOSIS — Z79899 Other long term (current) drug therapy: Secondary | ICD-10-CM

## 2012-03-16 DIAGNOSIS — R410 Disorientation, unspecified: Secondary | ICD-10-CM | POA: Diagnosis present

## 2012-03-16 DIAGNOSIS — I4891 Unspecified atrial fibrillation: Principal | ICD-10-CM

## 2012-03-16 DIAGNOSIS — R32 Unspecified urinary incontinence: Secondary | ICD-10-CM | POA: Diagnosis present

## 2012-03-16 DIAGNOSIS — I824Z9 Acute embolism and thrombosis of unspecified deep veins of unspecified distal lower extremity: Secondary | ICD-10-CM | POA: Diagnosis present

## 2012-03-16 DIAGNOSIS — S6991XA Unspecified injury of right wrist, hand and finger(s), initial encounter: Secondary | ICD-10-CM

## 2012-03-16 DIAGNOSIS — M25559 Pain in unspecified hip: Secondary | ICD-10-CM | POA: Diagnosis present

## 2012-03-16 DIAGNOSIS — E785 Hyperlipidemia, unspecified: Secondary | ICD-10-CM | POA: Diagnosis present

## 2012-03-16 DIAGNOSIS — M549 Dorsalgia, unspecified: Secondary | ICD-10-CM

## 2012-03-16 DIAGNOSIS — R404 Transient alteration of awareness: Secondary | ICD-10-CM | POA: Diagnosis present

## 2012-03-16 DIAGNOSIS — R45851 Suicidal ideations: Secondary | ICD-10-CM

## 2012-03-16 DIAGNOSIS — R159 Full incontinence of feces: Secondary | ICD-10-CM | POA: Diagnosis present

## 2012-03-16 DIAGNOSIS — S99922A Unspecified injury of left foot, initial encounter: Secondary | ICD-10-CM

## 2012-03-16 DIAGNOSIS — E876 Hypokalemia: Secondary | ICD-10-CM | POA: Diagnosis not present

## 2012-03-16 DIAGNOSIS — F411 Generalized anxiety disorder: Secondary | ICD-10-CM | POA: Diagnosis present

## 2012-03-16 DIAGNOSIS — I82409 Acute embolism and thrombosis of unspecified deep veins of unspecified lower extremity: Secondary | ICD-10-CM

## 2012-03-16 DIAGNOSIS — E041 Nontoxic single thyroid nodule: Secondary | ICD-10-CM | POA: Diagnosis present

## 2012-03-16 LAB — CBC
HCT: 41.7 % (ref 39.0–52.0)
Hemoglobin: 15.4 g/dL (ref 13.0–17.0)
MCH: 30 pg (ref 26.0–34.0)
MCHC: 36.9 g/dL — ABNORMAL HIGH (ref 30.0–36.0)
MCV: 81.3 fL (ref 78.0–100.0)
Platelets: 198 10*3/uL (ref 150–400)
RBC: 5.13 MIL/uL (ref 4.22–5.81)
RDW: 13.3 % (ref 11.5–15.5)
WBC: 14.5 10*3/uL — ABNORMAL HIGH (ref 4.0–10.5)

## 2012-03-16 LAB — PRO B NATRIURETIC PEPTIDE: Pro B Natriuretic peptide (BNP): 4419 pg/mL — ABNORMAL HIGH (ref 0–125)

## 2012-03-16 LAB — BASIC METABOLIC PANEL
BUN: 24 mg/dL — ABNORMAL HIGH (ref 6–23)
CO2: 22 mEq/L (ref 19–32)
Calcium: 9.7 mg/dL (ref 8.4–10.5)
Chloride: 103 mEq/L (ref 96–112)
Creatinine, Ser: 1.01 mg/dL (ref 0.50–1.35)
GFR calc Af Amer: 89 mL/min — ABNORMAL LOW (ref >90)
GFR calc non Af Amer: 77 mL/min — ABNORMAL LOW (ref >90)
Glucose, Bld: 128 mg/dL — ABNORMAL HIGH (ref 70–99)
Potassium: 3 mEq/L — ABNORMAL LOW (ref 3.5–5.1)
Sodium: 143 mEq/L (ref 135–145)

## 2012-03-16 LAB — APTT: aPTT: 34 seconds (ref 24–37)

## 2012-03-16 LAB — ETHANOL: Alcohol, Ethyl (B): <11 mg/dL (ref 0–11)

## 2012-03-16 LAB — TROPONIN I: Troponin I: <0.3 ng/mL (ref <0.30)

## 2012-03-16 LAB — POCT I-STAT TROPONIN I: Troponin i, poc: 0 ng/mL (ref 0.00–0.08)

## 2012-03-16 LAB — PROTIME-INR
INR: 1.08 (ref 0.00–1.49)
Prothrombin Time: 13.9 seconds (ref 11.6–15.2)

## 2012-03-16 LAB — SALICYLATE LEVEL: Salicylate Lvl: <2 mg/dL — ABNORMAL LOW (ref 2.8–20.0)

## 2012-03-16 MED ORDER — POTASSIUM CHLORIDE CRYS ER 20 MEQ PO TBCR
40.0000 meq | EXTENDED_RELEASE_TABLET | Freq: Three times a day (TID) | ORAL | Status: DC
  Administered 2012-03-17: 40 meq via ORAL
  Filled 2012-03-16 (×3): qty 2

## 2012-03-16 MED ORDER — LORAZEPAM 2 MG/ML IJ SOLN
1.0000 mg | Freq: Once | INTRAMUSCULAR | Status: AC
  Administered 2012-03-16: 1 mg via INTRAVENOUS
  Filled 2012-03-16: qty 1

## 2012-03-16 MED ORDER — DILTIAZEM HCL 50 MG/10ML IV SOLN: 10.0000 mg | Freq: Once | INTRAVENOUS | Status: DC

## 2012-03-16 MED ORDER — DILTIAZEM HCL 100 MG IV SOLR
5.0000 mg/h | INTRAVENOUS | Status: DC
  Administered 2012-03-16: 5 mg/h via INTRAVENOUS
  Administered 2012-03-16: 15 mg/h via INTRAVENOUS
  Administered 2012-03-17: 10 mg/h via INTRAVENOUS
  Filled 2012-03-16: qty 100

## 2012-03-16 MED ORDER — DILTIAZEM LOAD VIA INFUSION
10.0000 mg | Freq: Once | INTRAVENOUS | Status: DC
  Filled 2012-03-16: qty 10

## 2012-03-16 MED ORDER — LORAZEPAM 2 MG/ML IJ SOLN
1.0000 mg | Freq: Once | INTRAMUSCULAR | Status: AC
  Administered 2012-03-16: 1 mg via INTRAVENOUS
  Filled 2012-03-16 (×2): qty 1

## 2012-03-16 NOTE — ED Notes (Signed)
MD at bedside. 

## 2012-03-16 NOTE — H&P (Signed)
Family Medicine Teaching Service Admission H&P Service Pager: [phone]  Patient name: Brandon [name] Medical record number: [id number] Date of birth: Dec 18, 1950 Age: 62 y.o. Gender: male  Primary Care Provider: Almedia Balls, MD OP Ortho: Tomasita Crumble ([facility]) OP Neurosurg: Dr. Franky Macho  Attending Physician: Barbaraann Barthel, MD Consultants:   CODE STATUS: FULL CODE  CC  Confusion, difficulty walking, tachycardia and hypotension  HPI  Brandon [name] is a 62 y.o. year old male who was brought to the ED following 1 week - 10 days of worsening confusion, difficulty walking due to left back/hip/thigh pain and was reporting new onset of dizziness with subsequent findings of tachycardai and hypotension by his daughter who was a paramedic.    Problem # 1:  Found to be in A Fib with RVR upon arriving at the ED.  New onset.  Denies prior cardiac history, denies current chest pain.  Was previously scheduled for Exercise stress test in January but was unable to be completed due to markedly elevated blood pressure.  Has not seen a PCP in multiple years until recently and is not on statin therapy or ASA.  Problem #2: Left Back/Hip/Thigh pain.  Appears to be an acutely decompensated issue as had prior left femoral non-union ~30 years ago.  Over the past 2 months seems to have significantly worsened.  Followed by [facility] for orthopedic problems but has also been evaluated X 1 at Dr. Franky Macho with [facility].  He reports new symptoms of fecal incontinence X 10 days along with worsening pain, inability to walk and confusion. Has been scooting down the steps on his bottom due to the pain and weakness.  Had steroid dose pack, hip injection, increased amount of pain medication and diazepam.  See below.  Problem #3:  Confusion.  Reports ~10 day duration.  Approximately at the same time as the finishing a prednisone burst pack and receiving a left hip injection.  Has had progressive paranoia.  Oriented  however tangential and markedly abnormal compared to normal per his wife.  Reports he packed up his car after his wife kicked him out of the house yesterday but was too tired to leave today.  His wife reports this is not remotely true and that he wouldn't be able to pack the car even if she had kicked him out due to his pain and debility.  No history of hallucinations, no prior psych hospitalizations, some baseline anxiety.  Has been using increased Oxycodone, Diaxepam, Trazodone and Advil PM.   ROS   Constitutional Weakness, fatigue, confusion, no lethergy  Infectious No fevers, no chills  Resp No cough, no congestion  Cardiac No chest pain, palpitations, orthostasis, no orthopnea, hx of uncontrolled HTN  GI + stool incontinence with valsalva, no diarrhea, no constipation, no melana, no hematachezia  GU No urinary retention or incontinence  Skin No new rashes  Psych As above  Neuro Altered mental status, no LOC, no seziure like activity, no unilateral weakness, no dysarthria.  visual changes in periphery described as shadowing  MSK Significant traumas in past including L hip X 2 with non-union  Trauma ?fall ~7-10 days ago at onset of symptoms but no reported injuries, denies hitting head  Activity Decreased compared to normal    HISTORY  PMHx:  Past Medical History  Diagnosis Date  . Arthritis   . Pain in joint, pelvic region and thigh   . Anxiety   . Hypertension   . Osteoporosis   . Sleep  apnea   . Cataract   . Blood transfusion     PSHx: Past Surgical History  Procedure Laterality Date  . Knee arthroscopy      left  . Colonoscopy w/ polypectomy    . Rotator cuff repair      right  . Tonsillectomy    . Cataract extraction    . Hernia repair  07/06/2011  . Replacement total knee      Social Hx: History   Social History  . Marital Status: Married    Spouse Name: N/A    Number of Children: N/A  . Years of Education: N/A   Social History Main Topics  . Smoking  status: Former Games developer  . Smokeless tobacco: None  . Alcohol Use: Yes     Comment: ocass  . Drug Use: No     Comment: negative hx for IV drug abuse  . Sexually Active: None   Other Topics Concern  . None   Social History Narrative   Lives with wife in [place]   Retired   Prior [employer]    Family Hx: Family History  Problem Relation Age of Onset  . Cancer Father     bone  . Cancer Paternal Grandmother     ovarian    Allergies: No Known Allergies  Home Medications:  (Not in a hospital admission)  OBJECTIVE  Vitals: Temp:  [97.6 F (36.4 C)] 97.6 F (36.4 C) (03/12 1603) Pulse Rate:  [43-156] 46 (03/12 2300) Resp:  [12-25] 22 (03/12 2300) BP: (87-152)/(45-110) 109/74 mmHg (03/12 2300) SpO2:  [93 %-100 %] 95 % (03/12 2300)  Weight: Wt Readings from Last 3 Encounters:  07/29/11 316 lb 12.8 oz (143.7 kg)  05/29/11 321 lb 9.6 oz (145.877 kg)  06/20/08 316 lb (143.337 kg)    I&Os: Yesterday:   This shift:     PE: GENERAL:  Adult Obese  male. In mild discomfort; no respiratory distress. PSYCH: Alert and interactive but tangential and confabulatory.  Paranoia of associated with his wife trying to "get him", ?suicidial at admission but denies SI/HI.  Denies visual or auditory hallucinations.   Apparent intermittent moments of clarity, reports he knows he is confused but cannot explain what is happening H&N: AT/[employer], trachea midline EENT:  MMM, no scleral icterus, EOMi HEART: distant but tachy irregularly, irregular,  no murmur appreciated LUNGS: CTA B, no wheezes, no crackles Abdomen: soft non-tender, obese EXTREMITIES: Moves all 4 extremities spontaneously, warm well perfused, no edema, bilateral DP and PT pulses 2/4.   NEURO: B sustained horizontal Nystagmus otherwise CN II-XI intact without deficits.  UE and LE motor strength 5+/5 in all myotomes, LE reflexes 1+/4 in B patellar and 2+/4 in B achilles.  Negative straight leg raise.  Strong rectal done with  normal anal wink.  Rhomberg negative, Rapid alternating movements normal.  Able to perform bed mobility and stand at end of bed unassisted.  No guarded neck ROM.  1 episode of loss of bowel function while laying from sitting position    LABS: CBC BMET   Recent Labs Lab 03/16/12 1629  WBC 14.5*  HGB 15.4  HCT 41.7  PLT 198    Recent Labs Lab 03/16/12 1629  NA 143  K 3.0*  CL 103  CO2 22  BUN 24*  CREATININE 1.01  GLUCOSE 128*  CALCIUM 9.7    Results for [facility] (MRN [id number]) as of 03/16/2012 23:49  Ref. Range 03/16/2012 16:30  Pro B Natriuretic peptide (BNP)  Latest Range: 0-125 pg/mL 4419.0 (H)   URINE STUDIES: none  MICRO: none  IMAGING: Ct Head Wo Contrast  03/17/2012  *RADIOLOGY REPORT*  Clinical Data:  Altered mental status, disorientation, new nystagmus, leukocytosis  CT HEAD WITHOUT CONTRAST  Technique:  Contiguous axial images were obtained from the base of the skull through the vertex without contrast.  Comparison: None  Findings: Mild atrophy. Normal ventricular morphology. No midline shift or mass effect. No definite intracranial hemorrhage, mass lesion or evidence of acute infarction. No extra-axial fluid collections. Few scattered streak artifacts. Mucosal thickening in ethmoid air cells bilaterally. Calvaria intact.  IMPRESSION: No acute intracranial abnormalities.   Original Report Authenticated By: Ulyses Southward, M.D.    Dg Chest Port 1 View  03/16/2012  *RADIOLOGY REPORT*  Clinical Data: Chest pain, shortness of breath.  PORTABLE CHEST - 1 VIEW  Comparison: 12/18/2005  Findings: Both views are degraded by patient body habitus, AP portable technique, and overlying EKG leads.  The far inferior lateral right chest is excluded from both images.  Mild cardiomegaly.  Remote right clavicular fracture with non union. No pleural effusion or pneumothorax.  No congestive failure.  Calcified granuloma in the right upper lobe laterally.  IMPRESSION: Cardiomegaly, without  acute disease.  Multifactorial degradation.   Original Report Authenticated By: Jeronimo Greaves, M.D.     Medications:   . diltiazem    . diltiazem (CARDIZEM) infusion 10 mg/hr (03/16/12 2200)   . potassium chloride  40 mEq Oral TID PC & HS     Assessment & Plan  LOS: 60 62 y.o. year old male with confusion, difficulty walking and found to be in new onset A Fib with RVR.  # A Fib with RVR: new onset.  HR increased with neuro exam to 170, BP to 90s/60s.  Likelihood of PE as eliciting event is 3-16.2% according to Wells Criteria. CHADS-VASC score of 1 (HTN) - Currently ASA therapy only and SQ Heparin  Started on Dilt Drip, titrating, will convert to PO once stable.  BP tolerating when rate improved.  Will need cardiac eval including Tele, ECHO, CEs, TSH, A1c, Lipids.  Reasses need for anti-coagulation once further testing is obtained (DM and HLD)  Low threshold of Cardiology evaluation if any abnormality of difficult to control Rate  Consider CTA if evidence of R heart strain on ECHO   # HTN: known prior uncontrolled HTN.  Only recently re-established with PCP.  Will need BP meds titrated and adjusted home regimen with rate control added.    Hold home meds at this time   # Altered Mental Status, Delirium: Unclear etiology.  Most likely polypharmacy.  Will decrease sedating medications.  CIWA protocol for monitoring Benzo withdrawal. Consider trauma s/p fall, consider infectious etiology given leukocytosis.  ?Steroid psychosis mixed with polypharmacy as recent Prednisone use  CT Head, consider follow up MRI if not improving following medication withdrawal  UA  UTox   # Back/Hip/Leg Pain: Unclear etiology.  Fecal incontinence concerning but given no focal neurologic deficit, good rectal tone on exam, polypharmacy and recent steroid which is reassuring will defer MRI of back at this time.  Seems to be more pain associated with painful movements particularly eccentric hip extension (sitting to  laying).  Low threshold for further imaging however.  Consider MRI of R femur as well as potential fracture at prior mal/nonunion sight on proximal femur.  Consider Ortho vs Neurosurgical evaluation once improved from cardiology standpoint.   Continue to monitor.    Avoid  sedating medications   # Sleep Apnea: does not wear CPAP.   Continues Pulse Ox at this time  --- FEN  *Bananna Bag X 1 then NS @ 170ml/hr -Cardiac --- PPx: Heparin  Disposition  To SDU for Dilt drip for rate control. Monitor BP and ACS evaluation.  Re-eval pain, need for anti-coagulation, mental status and bowel function.  Disposition pending clinical improvement.     Andrena Mews, DO [facility] Resident - PGY-2  03/17/2012 12:18 AM

## 2012-03-16 NOTE — ED Notes (Signed)
Pt tearful.  Unable to clearly state his medx.  Afib with RVR.  Does not think he has any cardiac hx.  States he has stopped taking his pain meds b/c he feels they are giving him too many pain meds.  Continues to cry.

## 2012-03-16 NOTE — ED Notes (Signed)
Pt remains in afib rvr, though bp is decreasing to sbp 97.  Dr Bernette Mayers notified and stated to give ativan b/c pt is highly anxious.

## 2012-03-16 NOTE — ED Notes (Signed)
Pt denies SI or HI at this time.

## 2012-03-16 NOTE — ED Notes (Signed)
HR decreasing after ativan.  Dr Bernette Mayers aware.  Wife states that pt hs been paranoid that wife is going to kill him and telling her she has said things she hasn't.  Pt feels pt has been taking too much valium and his bottle is empty.

## 2012-03-16 NOTE — ED Provider Notes (Signed)
History     CSN: [id number]  Arrival date & time 03/16/12  1546   First MD Initiated Contact with Patient 03/16/12 1614      Chief Complaint  Patient presents with  . Chest Pain  . Shortness of Breath  . Medical Clearance    (Consider location/radiation/quality/duration/timing/severity/associated sxs/prior treatment) Patient is a 62 y.o. male presenting with chest pain and shortness of breath.  Chest Pain Associated symptoms: shortness of breath   Shortness of Breath Associated symptoms: chest pain    Level 5 caveat due to poor historian  Pt here with multiple complaints, frequently deflects my questions regarding his cardiac complaints to discuss his chronic hip pain. His daughter states he has not been complaining of any chest pain or SOB today, patient states he had some 'around 4pm' which is actually after his arrival here. He denies any CP or SOB at the time of my evaluation, but is emotionally labile. He apparently has had worsening chronic hip pain for several weeks, not improved with cortisone injections and narcotic pain medications at home. He told the triage nurse he had been having suicidal ideation recently. No known CAD.   Past Medical History  Diagnosis Date  . Arthritis   . Pain in joint, pelvic region and thigh   . Anxiety   . Hypertension   . Osteoporosis   . Sleep apnea   . Cataract   . Blood transfusion     Past Surgical History  Procedure Laterality Date  . Knee arthroscopy      left  . Colonoscopy w/ polypectomy    . Rotator cuff repair      right  . Tonsillectomy    . Cataract extraction    . Hernia repair  07/06/2011    Family History  Problem Relation Age of Onset  . Cancer Father     bone  . Cancer Paternal Grandmother     ovarian    History  Substance Use Topics  . Smoking status: Former Games developer  . Smokeless tobacco: Not on file  . Alcohol Use: Yes     Comment: ocass      Review of Systems  Respiratory: Positive for shortness  of breath.   Cardiovascular: Positive for chest pain.   Unable to assess due to mental status.   Allergies  Review of patient's allergies indicates no known allergies.  Home Medications   Current Outpatient Rx  Name  Route  Sig  Dispense  Refill  . amLODipine (NORVASC) 5 MG tablet   Oral   Take 5 mg by mouth daily.         Marland Kitchen atenolol-chlorthalidone (TENORETIC) 100-25 MG per tablet   Oral   Take 1 tablet by mouth daily.         . diazepam (VALIUM) 5 MG tablet   Oral   Take 1 tablet (5 mg total) by mouth every 6 (six) hours as needed for anxiety (spasms).   10 tablet   0   . escitalopram (LEXAPRO) 20 MG tablet   Oral   Take 20 mg by mouth daily.         Marland Kitchen ibuprofen (ADVIL,MOTRIN) 200 MG tablet   Oral   Take 800 mg by mouth every 6 (six) hours as needed for pain.         . naproxen (NAPROSYN) 500 MG tablet   Oral   Take 1 tablet (500 mg total) by mouth 2 (two) times daily.   30 tablet  0   . PERCOCET 5-325 MG per tablet   Oral   Take 1 tablet by mouth every 6 (six) hours as needed for pain.   15 tablet   0     Dispense as written.   . potassium chloride SA (K-DUR,KLOR-CON) 20 MEQ tablet   Oral   Take 20 mEq by mouth 2 (two) times daily.           BP 127/98  Pulse 68  Temp(Src) 97.6 F (36.4 C) (Oral)  Resp 18  SpO2 100%  Physical Exam  Nursing note and vitals reviewed. Constitutional: He is oriented to person, place, and time. He appears well-developed and well-nourished.  HENT:  Head: Normocephalic and atraumatic.  Eyes: EOM are normal. Pupils are equal, round, and reactive to light.  Neck: Normal range of motion. Neck supple.  Cardiovascular: Normal heart sounds and intact distal pulses.   Tachycardia, irregular  Pulmonary/Chest: Effort normal and breath sounds normal.  Abdominal: Bowel sounds are normal. He exhibits no distension. There is no tenderness.  Musculoskeletal: Normal range of motion. He exhibits no edema and no tenderness.   Neurological: He is alert and oriented to person, place, and time. He has normal strength. No cranial nerve deficit or sensory deficit.  Skin: Skin is warm and dry. No rash noted.  Psychiatric:  Tearful, emotionally labile    ED Course  Procedures (including critical care time)  Labs Reviewed  CBC - Abnormal; Notable for the following:    WBC 14.5 (*)    MCHC 36.9 (*)    All other components within normal limits  BASIC METABOLIC PANEL - Abnormal; Notable for the following:    Potassium 3.0 (*)    Glucose, Bld 128 (*)    BUN 24 (*)    GFR calc non Af Amer 77 (*)    GFR calc Af Amer 89 (*)    All other components within normal limits  PRO B NATRIURETIC PEPTIDE - Abnormal; Notable for the following:    Pro B Natriuretic peptide (BNP) 4419.0 (*)    All other components within normal limits  APTT  PROTIME-INR  POCT I-STAT TROPONIN I   Dg Chest Port 1 View  03/16/2012  *RADIOLOGY REPORT*  Clinical Data: Chest pain, shortness of breath.  PORTABLE CHEST - 1 VIEW  Comparison: 12/18/2005  Findings: Both views are degraded by patient body habitus, AP portable technique, and overlying EKG leads.  The far inferior lateral right chest is excluded from both images.  Mild cardiomegaly.  Remote right clavicular fracture with non union. No pleural effusion or pneumothorax.  No congestive failure.  Calcified granuloma in the right upper lobe laterally.  IMPRESSION: Cardiomegaly, without acute disease.  Multifactorial degradation.   Original Report Authenticated By: Jeronimo Greaves, M.D.      1. Atrial fibrillation with rapid ventricular response   2. Chronic pain   3. Anxiety   4. Suicidal ideation       MDM   Date: 03/16/2012  Rate: 162  Rhythm: atrial fibrillation and RVR  QRS Axis: normal  Intervals: normal  ST/T Wave abnormalities: nonspecific ST/T changes  Conduction Disutrbances:none  Narrative Interpretation:   Old EKG Reviewed: none available   HR minimally improved with  initial cardizem bolus and drip. Given Ativan with improvement to HR. Continue on Cardizem drip, discussed admission with [facility] resident who will come see the patient. Discussed with patient and wife that his psychosocial issues will need to be evaluated after his cardiac problems  are managed.        Charles B. Bernette Mayers, MD 03/16/12 1901

## 2012-03-16 NOTE — ED Notes (Addendum)
Reports waking up this am with mid chest tightness and feeling sob. Airway is intact, pt is crying at triage, reports high stress levels, having SI over past 24 hours. Reports recent back pain and difficulty walking.

## 2012-03-17 ENCOUNTER — Inpatient Hospital Stay (HOSPITAL_COMMUNITY): Payer: Federal, State, Local not specified - PPO

## 2012-03-17 ENCOUNTER — Encounter (HOSPITAL_COMMUNITY): Payer: Self-pay | Admitting: Radiology

## 2012-03-17 DIAGNOSIS — I4891 Unspecified atrial fibrillation: Secondary | ICD-10-CM | POA: Diagnosis present

## 2012-03-17 DIAGNOSIS — R410 Disorientation, unspecified: Secondary | ICD-10-CM | POA: Diagnosis present

## 2012-03-17 LAB — HEPARIN LEVEL (UNFRACTIONATED): Heparin Unfractionated: 0.21 IU/mL — ABNORMAL LOW (ref 0.30–0.70)

## 2012-03-17 LAB — LIPID PANEL
Cholesterol: 170 mg/dL (ref 0–200)
HDL: 30 mg/dL — ABNORMAL LOW (ref >39)
LDL Cholesterol: 117 mg/dL — ABNORMAL HIGH (ref 0–99)
Total CHOL/HDL Ratio: 5.7 RATIO
Triglycerides: 117 mg/dL (ref <150)
VLDL: 23 mg/dL (ref 0–40)

## 2012-03-17 LAB — CBC
HCT: 39.1 % (ref 39.0–52.0)
Hemoglobin: 14.1 g/dL (ref 13.0–17.0)
MCH: 30.5 pg (ref 26.0–34.0)
MCHC: 36.1 g/dL — ABNORMAL HIGH (ref 30.0–36.0)
MCV: 84.4 fL (ref 78.0–100.0)
Platelets: 171 10*3/uL (ref 150–400)
RBC: 4.63 MIL/uL (ref 4.22–5.81)
RDW: 13.4 % (ref 11.5–15.5)
WBC: 9.3 10*3/uL (ref 4.0–10.5)

## 2012-03-17 LAB — COMPREHENSIVE METABOLIC PANEL
ALT: 13 U/L (ref 0–53)
AST: 19 U/L (ref 0–37)
Albumin: 3.5 g/dL (ref 3.5–5.2)
Alkaline Phosphatase: 71 U/L (ref 39–117)
BUN: 17 mg/dL (ref 6–23)
CO2: 24 mEq/L (ref 19–32)
Calcium: 9 mg/dL (ref 8.4–10.5)
Chloride: 105 mEq/L (ref 96–112)
Creatinine, Ser: 0.83 mg/dL (ref 0.50–1.35)
GFR calc Af Amer: >90 mL/min (ref >90)
GFR calc non Af Amer: >90 mL/min (ref >90)
Glucose, Bld: 117 mg/dL — ABNORMAL HIGH (ref 70–99)
Potassium: 2.4 mEq/L — CL (ref 3.5–5.1)
Sodium: 143 mEq/L (ref 135–145)
Total Bilirubin: 0.7 mg/dL (ref 0.3–1.2)
Total Protein: 6.5 g/dL (ref 6.0–8.3)

## 2012-03-17 LAB — TROPONIN I
Troponin I: <0.3 ng/mL (ref <0.30)
Troponin I: <0.3 ng/mL (ref <0.30)
Troponin I: <0.3 ng/mL (ref <0.30)

## 2012-03-17 LAB — URINALYSIS, ROUTINE W REFLEX MICROSCOPIC
Bilirubin Urine: NEGATIVE
Glucose, UA: NEGATIVE mg/dL
Ketones, ur: NEGATIVE mg/dL
Leukocytes, UA: NEGATIVE
Nitrite: NEGATIVE
Protein, ur: NEGATIVE mg/dL
Specific Gravity, Urine: 1.022 (ref 1.005–1.030)
Urobilinogen, UA: 1 mg/dL (ref 0.0–1.0)
pH: 6 (ref 5.0–8.0)

## 2012-03-17 LAB — BASIC METABOLIC PANEL
BUN: 12 mg/dL (ref 6–23)
CO2: 28 mEq/L (ref 19–32)
Calcium: 9 mg/dL (ref 8.4–10.5)
Chloride: 105 mEq/L (ref 96–112)
Creatinine, Ser: 0.92 mg/dL (ref 0.50–1.35)
GFR calc Af Amer: >90 mL/min (ref >90)
GFR calc non Af Amer: 88 mL/min — ABNORMAL LOW (ref >90)
Glucose, Bld: 109 mg/dL — ABNORMAL HIGH (ref 70–99)
Potassium: 3.3 mEq/L — ABNORMAL LOW (ref 3.5–5.1)
Sodium: 142 mEq/L (ref 135–145)

## 2012-03-17 LAB — D-DIMER, QUANTITATIVE
D-Dimer, Quant: 1.78 ug/mL-FEU — ABNORMAL HIGH (ref 0.00–0.48)
D-Dimer, Quant: 1.66 ug/mL-FEU — ABNORMAL HIGH (ref 0.00–0.48)

## 2012-03-17 LAB — MRSA PCR SCREENING: MRSA by PCR: NEGATIVE

## 2012-03-17 LAB — TSH
TSH: 2.247 u[IU]/mL (ref 0.350–4.500)
TSH: 0.994 u[IU]/mL (ref 0.350–4.500)

## 2012-03-17 LAB — URINE MICROSCOPIC-ADD ON

## 2012-03-17 LAB — MAGNESIUM: Magnesium: 1.8 mg/dL (ref 1.5–2.5)

## 2012-03-17 LAB — HEMOGLOBIN A1C
Hgb A1c MFr Bld: 5.6 % (ref <5.7)
Mean Plasma Glucose: 114 mg/dL (ref <117)

## 2012-03-17 MED ORDER — HEPARIN BOLUS VIA INFUSION
3000.0000 [IU] | Freq: Once | INTRAVENOUS | Status: AC
  Administered 2012-03-17: 3000 [IU] via INTRAVENOUS
  Filled 2012-03-17: qty 3000

## 2012-03-17 MED ORDER — IOHEXOL 350 MG/ML SOLN
100.0000 mL | Freq: Once | INTRAVENOUS | Status: AC | PRN
  Administered 2012-03-17: 100 mL via INTRAVENOUS

## 2012-03-17 MED ORDER — METOPROLOL TARTRATE 1 MG/ML IV SOLN
2.5000 mg | Freq: Four times a day (QID) | INTRAVENOUS | Status: DC | PRN
  Filled 2012-03-17 (×2): qty 5

## 2012-03-17 MED ORDER — AMIODARONE LOAD VIA INFUSION
150.0000 mg | Freq: Once | INTRAVENOUS | Status: AC
  Administered 2012-03-17: 150 mg via INTRAVENOUS
  Filled 2012-03-17: qty 83.34

## 2012-03-17 MED ORDER — METOPROLOL TARTRATE 1 MG/ML IV SOLN
5.0000 mg | Freq: Once | INTRAVENOUS | Status: AC
  Administered 2012-03-17: 5 mg via INTRAVENOUS

## 2012-03-17 MED ORDER — SODIUM CHLORIDE 0.9 % IV SOLN
INTRAVENOUS | Status: DC
  Administered 2012-03-17: 75 mL/h via INTRAVENOUS
  Administered 2012-03-18: 20 mL/h via INTRAVENOUS
  Administered 2012-03-18 – 2012-03-21 (×2): via INTRAVENOUS

## 2012-03-17 MED ORDER — METOPROLOL TARTRATE 1 MG/ML IV SOLN
INTRAVENOUS | Status: AC
  Filled 2012-03-17: qty 5

## 2012-03-17 MED ORDER — POTASSIUM CHLORIDE CRYS ER 20 MEQ PO TBCR
40.0000 meq | EXTENDED_RELEASE_TABLET | Freq: Once | ORAL | Status: AC
  Administered 2012-03-17: 40 meq via ORAL
  Filled 2012-03-17: qty 2

## 2012-03-17 MED ORDER — AMIODARONE HCL IN DEXTROSE 360-4.14 MG/200ML-% IV SOLN
60.0000 mg/h | INTRAVENOUS | Status: AC
  Administered 2012-03-17: 60 mg/h via INTRAVENOUS
  Filled 2012-03-17 (×2): qty 200

## 2012-03-17 MED ORDER — AMIODARONE HCL IN DEXTROSE 360-4.14 MG/200ML-% IV SOLN
30.0000 mg/h | INTRAVENOUS | Status: DC
  Administered 2012-03-17 – 2012-03-18 (×3): 30 mg/h via INTRAVENOUS
  Filled 2012-03-17 (×5): qty 200

## 2012-03-17 MED ORDER — POTASSIUM CHLORIDE CRYS ER 20 MEQ PO TBCR
40.0000 meq | EXTENDED_RELEASE_TABLET | ORAL | Status: AC
  Administered 2012-03-17 (×4): 40 meq via ORAL
  Filled 2012-03-17 (×2): qty 2

## 2012-03-17 MED ORDER — ATORVASTATIN CALCIUM 40 MG PO TABS
40.0000 mg | ORAL_TABLET | Freq: Every day | ORAL | Status: DC
  Administered 2012-03-17 – 2012-03-21 (×5): 40 mg via ORAL
  Filled 2012-03-17 (×8): qty 1

## 2012-03-17 MED ORDER — HEPARIN SODIUM (PORCINE) 5000 UNIT/ML IJ SOLN
5000.0000 [IU] | Freq: Three times a day (TID) | INTRAMUSCULAR | Status: DC
  Administered 2012-03-17: 5000 [IU] via SUBCUTANEOUS
  Filled 2012-03-17 (×5): qty 1

## 2012-03-17 MED ORDER — THIAMINE HCL 100 MG/ML IJ SOLN
Freq: Once | INTRAVENOUS | Status: AC
  Administered 2012-03-17: 02:00:00 via INTRAVENOUS
  Filled 2012-03-17: qty 1000

## 2012-03-17 MED ORDER — ASPIRIN EC 81 MG PO TBEC
81.0000 mg | DELAYED_RELEASE_TABLET | Freq: Every day | ORAL | Status: DC
  Administered 2012-03-17 – 2012-03-22 (×6): 81 mg via ORAL
  Filled 2012-03-17 (×6): qty 1

## 2012-03-17 MED ORDER — SODIUM CHLORIDE 0.9 % IJ SOLN
3.0000 mL | Freq: Two times a day (BID) | INTRAMUSCULAR | Status: DC
  Administered 2012-03-17 – 2012-03-22 (×8): 3 mL via INTRAVENOUS

## 2012-03-17 MED ORDER — METOPROLOL TARTRATE 25 MG PO TABS
25.0000 mg | ORAL_TABLET | Freq: Two times a day (BID) | ORAL | Status: DC
  Administered 2012-03-17 – 2012-03-18 (×3): 25 mg via ORAL
  Filled 2012-03-17 (×4): qty 1

## 2012-03-17 MED ORDER — LORAZEPAM 1 MG PO TABS: 2.0000 mg | ORAL_TABLET | Freq: Four times a day (QID) | ORAL | Status: DC | PRN

## 2012-03-17 MED ORDER — HEPARIN (PORCINE) IN NACL 100-0.45 UNIT/ML-% IJ SOLN
1900.0000 [IU]/h | INTRAMUSCULAR | Status: DC
  Administered 2012-03-17: 1600 [IU]/h via INTRAVENOUS
  Administered 2012-03-17 (×2): 1900 [IU]/h via INTRAVENOUS
  Filled 2012-03-17 (×4): qty 250

## 2012-03-17 NOTE — Progress Notes (Signed)
  Echocardiogram 2D Echocardiogram has been performed.  Tiegan Terpstra FRANCES 03/17/2012, 12:12 PM

## 2012-03-17 NOTE — H&P (Signed)
FMTS Attending Admit Note Patient seen and examined by me this morning, discussed extensively with Dr Berline Chough and I agree with his assessment and plan.  Patient's heart rate remains elevated and labile despite Cardizem gtt.  He is much more clear of mind this morning, and is able to give a cogent history.  He denies any dyspnea or chest discomfort and is not aware of his rapid heart rate.  Of note, he does endorse a 12# weight loss in the past 4 weeks (unintentional).  He describes his recent L hip pain as focalizing in the lateral thigh, does not radiate below the knee.   On exam, he is pleasant and cooperative.  No longer agitated. Neck supple, without tenderness or thyroid masses.  No cervical adenopathy.  Rapid irregular heart sounds.  Clear lung fields.  No lower extremity edema or disparity in calf girth, palpable dp pulses bilaterally with grossly intact sensation in feet bilaterally.  No cords or erythema in calves.   A/P" Patient with AF of indeterminate onset, poorly rate-controlled on Cardizem gtt.  Will start amiodarone, heparin gtt for anticoagulation for now.  Will order d-dimer to begin evaluation for possible PE, consider CTAchest if positive. Cardiology consult to discuss possibility of cardioversion (pharmacologic vs electocardioversion) and evaluation for possible underlying CAD.  ECHO, Troponins.  TSH is pending; his history raises concern for hyperthyoidism as a cause.  Patient is known to neurosurgeon Dr Mikal Plane, will contact regarding MRI imaging of femur +/- LS spine while patient is hospitalized in order to expedite his workup.  CIWA for concern with benzo withdrawal, given high doses previously.

## 2012-03-17 NOTE — Progress Notes (Signed)
CRITICAL VALUE ALERT  Critical value received:  K= 2.4  Date of notification:  03/17/12  Time of notification:  0600  Critical value read back:yes  Nurse who received alert:  L.Bufford Buttner  MD notified (1st page):  [phone]  Time of first page:  0650  MD notified (2nd page):  Time of second page:  Responding MD:  Dr Paulina Fusi  Time MD responded:  [phone]

## 2012-03-17 NOTE — Progress Notes (Signed)
PGY-2 Progress Note:  Met with patient and his 3 children concerning most recent findings, including bilateral PE on CTA. Pt was initially tearful about diagnosis. Explained to pt and children that we have him on heparin for treatment and that this is likely causing his A.fib. Daughter asked about treatment and if clot would go away. Informed that he will need to be on anti-coagulation to prevent new or spreading of current clot and it is likely that his body will clear current clots. Also discussed anticoagulation options with patient including Coumadin vs. NOAC. At this time, we will keep him on heparin drip in case he needs any procedures tomorrow. He will discuss options with his family and we will plan on transitioning him to oral therapy tomorrow. Risk factors include chronic lower extremity fracture and decreased mobility. He also states he had right leg pain a few weeks ago, but none currently.  Also discussed with patient about his thyroid nodule noted on CT scan, but TSH normal at this time and we will not do any workup on this while in the hospital unless something changes.  Of note, patient's mentation appears at baseline per children. He is talking coherently and asking appropriate questions. He told me that he doesn't remember anything about yesterday or where he was.  Tifanie Gardiner M. Tuck Dulworth, M.D. 03/17/2012 6:25 PM

## 2012-03-17 NOTE — Progress Notes (Signed)
ANTICOAGULATION CONSULT NOTE - Initial Consult  Pharmacy Consult for Heparin Indication: pulmonary embolus/afib  No Known Allergies  Patient Measurements: Height: 6' (182.9 cm) Weight: 288 lb (130.636 kg) IBW/kg (Calculated) : 77.6 Heparin Dosing Weight: 90 kg  Vital Signs: Temp: 97.4 F (36.3 C) (03/13 1700) Temp src: Oral (03/13 1700) BP: 115/75 mmHg (03/13 1350) Pulse Rate: 126 (03/13 1350)  Labs:  Recent Labs  03/16/12 1629  03/17/12 0432 03/17/12 0905 03/17/12 1001 03/17/12 1627  HGB 15.4  --  14.1  --   --   --   HCT 41.7  --  39.1  --   --   --   PLT 198  --  171  --   --   --   APTT 34  --   --   --   --   --   LABPROT 13.9  --   --   --   --   --   INR 1.08  --   --   --   --   --   HEPARINUNFRC  --   --   --   --   --  0.21*  CREATININE 1.01  --  0.83  --   --   --   TROPONINI  --   < > <0.30 <0.30 <0.30  --   < > = values in this interval not displayed.  Estimated Creatinine Clearance: 127.3 ml/min (by C-G formula based on Cr of 0.83).   Medical History: Past Medical History  Diagnosis Date  . Arthritis   . Pain in joint, pelvic region and thigh   . Anxiety   . Hypertension   . Osteoporosis   . Sleep apnea   . Cataract   . Blood transfusion   . Asthma     Medications:  Prescriptions prior to admission  Medication Sig Dispense Refill  . amLODipine (NORVASC) 5 MG tablet Take 5 mg by mouth daily.      Marland Kitchen atenolol-chlorthalidone (TENORETIC) 100-25 MG per tablet Take 1 tablet by mouth daily.      . diazepam (VALIUM) 10 MG tablet Take 10 mg by mouth every 12 (twelve) hours as needed for anxiety.      Marland Kitchen escitalopram (LEXAPRO) 20 MG tablet Take 20 mg by mouth daily.      Marland Kitchen ibuprofen (ADVIL,MOTRIN) 200 MG tablet Take 800 mg by mouth every 6 (six) hours as needed for pain.      . Ibuprofen-Diphenhydramine HCl 200-25 MG CAPS Take 1-2 tablets by mouth at bedtime as needed.      . potassium chloride SA (K-DUR,KLOR-CON) 20 MEQ tablet Take 20 mEq by mouth  2 (two) times daily.      . traZODone (DESYREL) 50 MG tablet Take 50 mg by mouth at bedtime as needed for sleep.      Marland Kitchen oxyCODONE-acetaminophen (PERCOCET) 10-325 MG per tablet Take 1 tablet by mouth every 4 (four) hours as needed for pain.       Admit Complaint: 62 y.o.  male  admitted 03/16/2012 with 10d worsening AMS, increasing back pain, noted to be in Afib in ED.  Pharmacy consulted to dose heparin for possible PE.  Assessment: Patient receiving heparin infusion at 1600 units/hr for bilateral PE and afib. First Xa level is subtherapeutic, will bolus and increase dose. Can we begin coumadin?  Goal of Therapy:  Heparin level 0.3-0.7 units/ml Monitor platelets by anticoagulation protocol: Yes   Plan:  Heparin 3000 units IV bolus and  increase gtt to 1900 units/hr. Check 6 hour heparin level.  Verlene Mayer, PharmD, BCPS Pager [phone] 03/17/2012 5:24 PM

## 2012-03-17 NOTE — Consult Note (Signed)
BP 115/75  Pulse 126  Temp(Src) 98.3 F (36.8 C) (Oral)  Resp 15  Ht 6' (1.829 m)  Wt 130.636 kg (288 lb)  BMI 39.05 kg/m2  SpO2 92% ZO:XWRU pain, bowel incontinence HPI: Brandon [name] is known to me after I saw him for a consultation regarding his back pain. He did not have any recent radiologic studies for review. I therefore was unable to render an opinion with regards to any treatment. He was confused that day in the office, and his wife specifically did not want me to prescribe valium because that increased his confusion. Mrs. Briles  called the office yesterday stating he was incontinent of bowel and bladder. He was seen in the emergency room and admitted for acute atrial fibrillation. His neuro exam was fairly unremarkable for motor function. He moved all extremities well, and had normal rectal tone. He was also hypotensive. Now with known pulmonary emboli. Awaiting MRI of lumbar spine and hip. Clearly at this time he is not an operative candidate. Currently on heparin.  No Known Allergies Past Medical History  Diagnosis Date  . Arthritis   . Pain in joint, pelvic region and thigh   . Anxiety   . Hypertension   . Osteoporosis   . Sleep apnea   . Cataract   . Blood transfusion   . Asthma    Past Surgical History  Procedure Laterality Date  . Knee arthroscopy      left  . Colonoscopy w/ polypectomy    . Rotator cuff repair      right  . Tonsillectomy    . Cataract extraction    . Hernia repair  07/06/2011  . Replacement total knee     Family History  Problem Relation Age of Onset  . Cancer Father     bone  . Cancer Paternal Grandmother     ovarian   History   Social History  . Marital Status: Married    Spouse Name: N/A    Number of Children: N/A  . Years of Education: N/A   Occupational History  . Not on file.   Social History Main Topics  . Smoking status: Former Games developer  . Smokeless tobacco: Not on file  . Alcohol Use: Yes     Comment: ocass  . Drug Use: No      Comment: negative hx for IV drug abuse  . Sexually Active: Not on file   Other Topics Concern  . Not on file   Social History Narrative   Lives with wife in [place]   Retired   Psychologist, prison and probation services   Prior to Admission medications   Medication Sig Start Date End Date Taking? Authorizing Provider  amLODipine (NORVASC) 5 MG tablet Take 5 mg by mouth daily.   Yes Historical Provider, MD  atenolol-chlorthalidone (TENORETIC) 100-25 MG per tablet Take 1 tablet by mouth daily.   Yes Historical Provider, MD  diazepam (VALIUM) 10 MG tablet Take 10 mg by mouth every 12 (twelve) hours as needed for anxiety.   Yes Historical Provider, MD  escitalopram (LEXAPRO) 20 MG tablet Take 20 mg by mouth daily.   Yes Historical Provider, MD  ibuprofen (ADVIL,MOTRIN) 200 MG tablet Take 800 mg by mouth every 6 (six) hours as needed for pain.   Yes Historical Provider, MD  Ibuprofen-Diphenhydramine HCl 200-25 MG CAPS Take 1-2 tablets by mouth at bedtime as needed.   Yes Historical Provider, MD  potassium chloride SA (K-DUR,KLOR-CON) 20 MEQ tablet Take 20 mEq by  mouth 2 (two) times daily.   Yes Historical Provider, MD  traZODone (DESYREL) 50 MG tablet Take 50 mg by mouth at bedtime as needed for sleep.   Yes Historical Provider, MD  oxyCODONE-acetaminophen (PERCOCET) 10-325 MG per tablet Take 1 tablet by mouth every 4 (four) hours as needed for pain.    Historical Provider, MD   Results for orders placed during the hospital encounter of 03/16/12 (from the past 24 hour(s))  CBC     Status: Abnormal   Collection Time    03/16/12  4:29 PM      Result Value Range   WBC 14.5 (*) 4.0 - 10.5 K/uL   RBC 5.13  4.22 - 5.81 MIL/uL   Hemoglobin 15.4  13.0 - 17.0 g/dL   HCT 52.8  41.3 - 24.4 %   MCV 81.3  78.0 - 100.0 fL   MCH 30.0  26.0 - 34.0 pg   MCHC 36.9 (*) 30.0 - 36.0 g/dL   RDW 01.0  27.2 - 53.6 %   Platelets 198  150 - 400 K/uL  BASIC METABOLIC PANEL     Status: Abnormal   Collection Time    03/16/12  4:29  PM      Result Value Range   Sodium 143  135 - 145 mEq/L   Potassium 3.0 (*) 3.5 - 5.1 mEq/L   Chloride 103  96 - 112 mEq/L   CO2 22  19 - 32 mEq/L   Glucose, Bld 128 (*) 70 - 99 mg/dL   BUN 24 (*) 6 - 23 mg/dL   Creatinine, Ser 6.44  0.50 - 1.35 mg/dL   Calcium 9.7  8.4 - 03.4 mg/dL   GFR calc non Af Amer 77 (*) >90 mL/min   GFR calc Af Amer 89 (*) >90 mL/min  APTT     Status: None   Collection Time    03/16/12  4:29 PM      Result Value Range   aPTT 34  24 - 37 seconds  PROTIME-INR     Status: None   Collection Time    03/16/12  4:29 PM      Result Value Range   Prothrombin Time 13.9  11.6 - 15.2 seconds   INR 1.08  0.00 - 1.49  PRO B NATRIURETIC PEPTIDE     Status: Abnormal   Collection Time    03/16/12  4:30 PM      Result Value Range   Pro B Natriuretic peptide (BNP) 4419.0 (*) 0 - 125 pg/mL  POCT I-STAT TROPONIN I     Status: None   Collection Time    03/16/12  4:44 PM      Result Value Range   Troponin i, poc 0.00  0.00 - 0.08 ng/mL   Comment 3           ETHANOL     Status: None   Collection Time    03/16/12  9:01 PM      Result Value Range   Alcohol, Ethyl (B) <11  0 - 11 mg/dL  SALICYLATE LEVEL     Status: Abnormal   Collection Time    03/16/12  9:01 PM      Result Value Range   Salicylate Lvl <2.0 (*) 2.8 - 20.0 mg/dL  TSH     Status: None   Collection Time    03/16/12 10:46 PM      Result Value Range   TSH 2.247  0.350 - 4.500 uIU/mL  TROPONIN I     Status: None   Collection Time    03/16/12 10:46 PM      Result Value Range   Troponin I <0.30  <0.30 ng/mL  URINALYSIS, ROUTINE W REFLEX MICROSCOPIC     Status: Abnormal   Collection Time    03/17/12  1:33 AM      Result Value Range   Color, Urine YELLOW  YELLOW   APPearance CLEAR  CLEAR   Specific Gravity, Urine 1.022  1.005 - 1.030   pH 6.0  5.0 - 8.0   Glucose, UA NEGATIVE  NEGATIVE mg/dL   Hgb urine dipstick TRACE (*) NEGATIVE   Bilirubin Urine NEGATIVE  NEGATIVE   Ketones, ur NEGATIVE   NEGATIVE mg/dL   Protein, ur NEGATIVE  NEGATIVE mg/dL   Urobilinogen, UA 1.0  0.0 - 1.0 mg/dL   Nitrite NEGATIVE  NEGATIVE   Leukocytes, UA NEGATIVE  NEGATIVE  URINE MICROSCOPIC-ADD ON     Status: None   Collection Time    03/17/12  1:33 AM      Result Value Range   WBC, UA 0-2  <3 WBC/hpf   RBC / HPF 3-6  <3 RBC/hpf   Bacteria, UA RARE  RARE   Urine-Other MUCOUS PRESENT    MRSA PCR SCREENING     Status: None   Collection Time    03/17/12  2:08 AM      Result Value Range   MRSA by PCR NEGATIVE  NEGATIVE  TROPONIN I     Status: None   Collection Time    03/17/12  4:32 AM      Result Value Range   Troponin I <0.30  <0.30 ng/mL  COMPREHENSIVE METABOLIC PANEL     Status: Abnormal   Collection Time    03/17/12  4:32 AM      Result Value Range   Sodium 143  135 - 145 mEq/L   Potassium 2.4 (*) 3.5 - 5.1 mEq/L   Chloride 105  96 - 112 mEq/L   CO2 24  19 - 32 mEq/L   Glucose, Bld 117 (*) 70 - 99 mg/dL   BUN 17  6 - 23 mg/dL   Creatinine, Ser 1.61  0.50 - 1.35 mg/dL   Calcium 9.0  8.4 - 09.6 mg/dL   Total Protein 6.5  6.0 - 8.3 g/dL   Albumin 3.5  3.5 - 5.2 g/dL   AST 19  0 - 37 U/L   ALT 13  0 - 53 U/L   Alkaline Phosphatase 71  39 - 117 U/L   Total Bilirubin 0.7  0.3 - 1.2 mg/dL   GFR calc non Af Amer >90  >90 mL/min   GFR calc Af Amer >90  >90 mL/min  CBC     Status: Abnormal   Collection Time    03/17/12  4:32 AM      Result Value Range   WBC 9.3  4.0 - 10.5 K/uL   RBC 4.63  4.22 - 5.81 MIL/uL   Hemoglobin 14.1  13.0 - 17.0 g/dL   HCT 04.5  40.9 - 81.1 %   MCV 84.4  78.0 - 100.0 fL   MCH 30.5  26.0 - 34.0 pg   MCHC 36.1 (*) 30.0 - 36.0 g/dL   RDW 91.4  78.2 - 95.6 %   Platelets 171  150 - 400 K/uL  LIPID PANEL     Status: Abnormal   Collection Time    03/17/12  4:32  AM      Result Value Range   Cholesterol 170  0 - 200 mg/dL   Triglycerides 161  <096 mg/dL   HDL 30 (*) >04 mg/dL   Total CHOL/HDL Ratio 5.7     VLDL 23  0 - 40 mg/dL   LDL Cholesterol 540 (*) 0  - 99 mg/dL  MAGNESIUM     Status: None   Collection Time    03/17/12  4:32 AM      Result Value Range   Magnesium 1.8  1.5 - 2.5 mg/dL  TROPONIN I     Status: None   Collection Time    03/17/12  9:05 AM      Result Value Range   Troponin I <0.30  <0.30 ng/mL  D-DIMER, QUANTITATIVE     Status: Abnormal   Collection Time    03/17/12  9:05 AM      Result Value Range   D-Dimer, Quant 1.78 (*) 0.00 - 0.48 ug/mL-FEU  TROPONIN I     Status: None   Collection Time    03/17/12 10:01 AM      Result Value Range   Troponin I <0.30  <0.30 ng/mL  D-DIMER, QUANTITATIVE     Status: Abnormal   Collection Time    03/17/12 10:30 AM      Result Value Range   D-Dimer, Quant 1.66 (*) 0.00 - 0.48 ug/mL-FEU    On exam he is alert and oriented to person, place and time Perrl, full eom Symmetric facies, symmetric facial sensation Hearing intact to voice Tongue and uvula are both midline Shoulder shrug is normal Moving all extremities well Intact proprioception Normal muscle tone, and bulk Will await radiologic studies.

## 2012-03-17 NOTE — Progress Notes (Signed)
Utilization review completed.  

## 2012-03-17 NOTE — Consult Note (Signed)
Reason for Consult: Atrial fibrillation with rapid ventricular response Referring Physician: Family medicine teaching service  Brandon [name] is an 62 y.o. male.  HPI: Patient is 62 year old male with past medical history significant for hypertension, anxiety disorder, chronic pain syndrome, history of multiple orthopedic surgeries in the past, activity very limited for last 4 weeks due to chronic pain, history of sleep apnea, osteoporosis, was admitted yesterday because of sudden onset of shortness of breath associated with generalized weakness confusion and was noted by daughter to be hypotensive and tachycardic and was noted to be in A. fib with RVR. Patient received IV Cardizem bolus and was started on a drip without any headache rate control of heart rate. Patient denies her any palpitations but states suddenly felt weak and tired yesterday. Patient denies any chest pain nausea or vomiting or diaphoresis. Denies history of PND orthopnea or leg swelling. Denies any recent surgeries. Denies any thyroid problems or any history of rheumatic fever in the past. Patient states he has been on multiple pain medications for his chronic back and hip pain. Patient denies any history of alcohol abuse. Denies any history of palpitations or atrial fibrillation in the past. Patient states he was scheduled for a stress test a few months ago but could not be done because of blood pressure being elevated and subsequently was never rescheduled. Patient denies any history of exertional angina although his activity is limited.  Past Medical History  Diagnosis Date  . Arthritis   . Pain in joint, pelvic region and thigh   . Anxiety   . Hypertension   . Osteoporosis   . Sleep apnea   . Cataract   . Blood transfusion     Past Surgical History  Procedure Laterality Date  . Knee arthroscopy      left  . Colonoscopy w/ polypectomy    . Rotator cuff repair      right  . Tonsillectomy    . Cataract extraction    .  Hernia repair  07/06/2011  . Replacement total knee      Family History  Problem Relation Age of Onset  . Cancer Father     bone  . Cancer Paternal Grandmother     ovarian    Social History:  reports that he has quit smoking. He does not have any smokeless tobacco history on file. He reports that  drinks alcohol. He reports that he does not use illicit drugs.  Allergies: No Known Allergies  Medications: I have reviewed the patient's current medications.  Results for orders placed during the hospital encounter of 03/16/12 (from the past 48 hour(s))  CBC     Status: Abnormal   Collection Time    03/16/12  4:29 PM      Result Value Range   WBC 14.5 (*) 4.0 - 10.5 K/uL   RBC 5.13  4.22 - 5.81 MIL/uL   Hemoglobin 15.4  13.0 - 17.0 g/dL   HCT 40.9  81.1 - 91.4 %   MCV 81.3  78.0 - 100.0 fL   MCH 30.0  26.0 - 34.0 pg   MCHC 36.9 (*) 30.0 - 36.0 g/dL   RDW 78.2  95.6 - 21.3 %   Platelets 198  150 - 400 K/uL  BASIC METABOLIC PANEL     Status: Abnormal   Collection Time    03/16/12  4:29 PM      Result Value Range   Sodium 143  135 - 145 mEq/L   Potassium  3.0 (*) 3.5 - 5.1 mEq/L   Chloride 103  96 - 112 mEq/L   CO2 22  19 - 32 mEq/L   Glucose, Bld 128 (*) 70 - 99 mg/dL   BUN 24 (*) 6 - 23 mg/dL   Creatinine, Ser 1.61  0.50 - 1.35 mg/dL   Calcium 9.7  8.4 - 09.6 mg/dL   GFR calc non Af Amer 77 (*) >90 mL/min   GFR calc Af Amer 89 (*) >90 mL/min   Comment:            The eGFR has been calculated     using the CKD EPI equation.     This calculation has not been     validated in all clinical     situations.     eGFR's persistently     <90 mL/min signify     possible Chronic Kidney Disease.  APTT     Status: None   Collection Time    03/16/12  4:29 PM      Result Value Range   aPTT 34  24 - 37 seconds  PROTIME-INR     Status: None   Collection Time    03/16/12  4:29 PM      Result Value Range   Prothrombin Time 13.9  11.6 - 15.2 seconds   INR 1.08  0.00 - 1.49  PRO B  NATRIURETIC PEPTIDE     Status: Abnormal   Collection Time    03/16/12  4:30 PM      Result Value Range   Pro B Natriuretic peptide (BNP) 4419.0 (*) 0 - 125 pg/mL  POCT I-STAT TROPONIN I     Status: None   Collection Time    03/16/12  4:44 PM      Result Value Range   Troponin i, poc 0.00  0.00 - 0.08 ng/mL   Comment 3            Comment: Due to the release kinetics of cTnI,     a negative result within the first hours     of the onset of symptoms does not rule out     myocardial infarction with certainty.     If myocardial infarction is still suspected,     repeat the test at appropriate intervals.  ETHANOL     Status: None   Collection Time    03/16/12  9:01 PM      Result Value Range   Alcohol, Ethyl (B) <11  0 - 11 mg/dL   Comment:            LOWEST DETECTABLE LIMIT FOR     SERUM ALCOHOL IS 11 mg/dL     FOR MEDICAL PURPOSES ONLY  SALICYLATE LEVEL     Status: Abnormal   Collection Time    03/16/12  9:01 PM      Result Value Range   Salicylate Lvl <2.0 (*) 2.8 - 20.0 mg/dL  TROPONIN I     Status: None   Collection Time    03/16/12 10:46 PM      Result Value Range   Troponin I <0.30  <0.30 ng/mL   Comment:            Due to the release kinetics of cTnI,     a negative result within the first hours     of the onset of symptoms does not rule out     myocardial infarction with certainty.     If myocardial  infarction is still suspected,     repeat the test at appropriate intervals.  URINALYSIS, ROUTINE W REFLEX MICROSCOPIC     Status: Abnormal   Collection Time    03/17/12  1:33 AM      Result Value Range   Color, Urine YELLOW  YELLOW   APPearance CLEAR  CLEAR   Specific Gravity, Urine 1.022  1.005 - 1.030   pH 6.0  5.0 - 8.0   Glucose, UA NEGATIVE  NEGATIVE mg/dL   Hgb urine dipstick TRACE (*) NEGATIVE   Bilirubin Urine NEGATIVE  NEGATIVE   Ketones, ur NEGATIVE  NEGATIVE mg/dL   Protein, ur NEGATIVE  NEGATIVE mg/dL   Urobilinogen, UA 1.0  0.0 - 1.0 mg/dL    Nitrite NEGATIVE  NEGATIVE   Leukocytes, UA NEGATIVE  NEGATIVE  URINE MICROSCOPIC-ADD ON     Status: None   Collection Time    03/17/12  1:33 AM      Result Value Range   WBC, UA 0-2  <3 WBC/hpf   RBC / HPF 3-6  <3 RBC/hpf   Bacteria, UA RARE  RARE   Urine-Other MUCOUS PRESENT    MRSA PCR SCREENING     Status: None   Collection Time    03/17/12  2:08 AM      Result Value Range   MRSA by PCR NEGATIVE  NEGATIVE   Comment:            The GeneXpert MRSA Assay (FDA     approved for NASAL specimens     only), is one component of a     comprehensive MRSA colonization     surveillance program. It is not     intended to diagnose MRSA     infection nor to guide or     monitor treatment for     MRSA infections.  TROPONIN I     Status: None   Collection Time    03/17/12  4:32 AM      Result Value Range   Troponin I <0.30  <0.30 ng/mL   Comment:            Due to the release kinetics of cTnI,     a negative result within the first hours     of the onset of symptoms does not rule out     myocardial infarction with certainty.     If myocardial infarction is still suspected,     repeat the test at appropriate intervals.  COMPREHENSIVE METABOLIC PANEL     Status: Abnormal   Collection Time    03/17/12  4:32 AM      Result Value Range   Sodium 143  135 - 145 mEq/L   Potassium 2.4 (*) 3.5 - 5.1 mEq/L   Comment: CRITICAL RESULT CALLED TO, READ BACK BY AND VERIFIED WITH:     L.WORLEY RN [id number] 03/17/12 E.GADDY   Chloride 105  96 - 112 mEq/L   CO2 24  19 - 32 mEq/L   Glucose, Bld 117 (*) 70 - 99 mg/dL   BUN 17  6 - 23 mg/dL   Creatinine, Ser 5.40  0.50 - 1.35 mg/dL   Calcium 9.0  8.4 - 98.1 mg/dL   Total Protein 6.5  6.0 - 8.3 g/dL   Albumin 3.5  3.5 - 5.2 g/dL   AST 19  0 - 37 U/L   ALT 13  0 - 53 U/L   Alkaline Phosphatase 71  39 - 117 U/L  Total Bilirubin 0.7  0.3 - 1.2 mg/dL   GFR calc non Af Amer >90  >90 mL/min   GFR calc Af Amer >90  >90 mL/min   Comment:            The  eGFR has been calculated     using the CKD EPI equation.     This calculation has not been     validated in all clinical     situations.     eGFR's persistently     <90 mL/min signify     possible Chronic Kidney Disease.  CBC     Status: Abnormal   Collection Time    03/17/12  4:32 AM      Result Value Range   WBC 9.3  4.0 - 10.5 K/uL   RBC 4.63  4.22 - 5.81 MIL/uL   Hemoglobin 14.1  13.0 - 17.0 g/dL   HCT 78.2  95.6 - 21.3 %   MCV 84.4  78.0 - 100.0 fL   MCH 30.5  26.0 - 34.0 pg   MCHC 36.1 (*) 30.0 - 36.0 g/dL   RDW 08.6  57.8 - 46.9 %   Platelets 171  150 - 400 K/uL  LIPID PANEL     Status: Abnormal   Collection Time    03/17/12  4:32 AM      Result Value Range   Cholesterol 170  0 - 200 mg/dL   Triglycerides 629  <528 mg/dL   HDL 30 (*) >41 mg/dL   Total CHOL/HDL Ratio 5.7     VLDL 23  0 - 40 mg/dL   LDL Cholesterol 324 (*) 0 - 99 mg/dL   Comment:            Total Cholesterol/HDL:CHD Risk     Coronary Heart Disease Risk Table                         Men   Women      1/2 Average Risk   3.4   3.3      Average Risk       5.0   4.4      2 X Average Risk   9.6   7.1      3 X Average Risk  23.4   11.0                Use the calculated Patient Ratio     above and the CHD Risk Table     to determine the patient's CHD Risk.                ATP III CLASSIFICATION (LDL):      <100     mg/dL   Optimal      401-027  mg/dL   Near or Above                        Optimal      130-159  mg/dL   Borderline      253-664  mg/dL   High      >403     mg/dL   Very High  MAGNESIUM     Status: None   Collection Time    03/17/12  4:32 AM      Result Value Range   Magnesium 1.8  1.5 - 2.5 mg/dL    Ct Head Wo Contrast  03/17/2012  *RADIOLOGY REPORT*  Clinical Data:  Altered mental status, disorientation, new nystagmus, leukocytosis  CT HEAD WITHOUT CONTRAST  Technique:  Contiguous axial images were obtained from the base of the skull through the vertex without contrast.  Comparison:  None  Findings: Mild atrophy. Normal ventricular morphology. No midline shift or mass effect. No definite intracranial hemorrhage, mass lesion or evidence of acute infarction. No extra-axial fluid collections. Few scattered streak artifacts. Mucosal thickening in ethmoid air cells bilaterally. Calvaria intact.  IMPRESSION: No acute intracranial abnormalities.   Original Report Authenticated By: Ulyses Southward, M.D.    Dg Chest Port 1 View  03/16/2012  *RADIOLOGY REPORT*  Clinical Data: Chest pain, shortness of breath.  PORTABLE CHEST - 1 VIEW  Comparison: 12/18/2005  Findings: Both views are degraded by patient body habitus, AP portable technique, and overlying EKG leads.  The far inferior lateral right chest is excluded from both images.  Mild cardiomegaly.  Remote right clavicular fracture with non union. No pleural effusion or pneumothorax.  No congestive failure.  Calcified granuloma in the right upper lobe laterally.  IMPRESSION: Cardiomegaly, without acute disease.  Multifactorial degradation.   Original Report Authenticated By: Jeronimo Greaves, M.D.     Review of Systems  Constitutional: Negative for fever, chills and weight loss.  HENT: Negative for hearing loss.   Eyes: Negative for blurred vision and double vision.  Cardiovascular: Negative for chest pain, palpitations, orthopnea, claudication and leg swelling.  Gastrointestinal: Negative for nausea, vomiting and abdominal pain.  Genitourinary: Negative for dysuria.  Neurological: Positive for dizziness. Negative for headaches.   Blood pressure 107/63, pulse 121, temperature 97.3 F (36.3 C), temperature source Oral, resp. rate 19, height 6' (1.829 m), weight 130.636 kg (288 lb), SpO2 96.00%. Physical Exam  Constitutional: He is oriented to person, place, and time. He appears well-developed and well-nourished.  HENT:  Head: Atraumatic.  Mouth/Throat: No oropharyngeal exudate.  Eyes: Conjunctivae are normal. Pupils are equal, round, and  reactive to light. Left eye exhibits no discharge. No scleral icterus.  Neck: Normal range of motion. Neck supple. No JVD present. No tracheal deviation present. No thyromegaly present.  Cardiovascular:  Irregular tachycardic S1 and S2 soft no S3 gallop  Respiratory: Breath sounds normal. He is in respiratory distress. He has no wheezes. He has no rales.  GI: Soft. Bowel sounds are normal. He exhibits no distension. There is no tenderness. There is no rebound.  Musculoskeletal: He exhibits no edema and no tenderness.  Neurological: He is alert and oriented to person, place, and time.    Assessment/Plan: New-onset A. fib with RVR with recently being bedbound rule out PE Hypertension Anxiety disorder Chronic pain syndrome Status post delirium secondary to meds. Plan Check serial enzymes and EKG Check lipid panel, TSH, d-dimer if positive CT of chest to rule out PE 2-D echo Agree with IV heparin for now and IV amiodarone .wean on Cardizem drip Add low dose Lopressor per orders  Teddy Rebstock N 03/17/2012, 9:48 AM

## 2012-03-17 NOTE — Progress Notes (Signed)
ANTICOAGULATION CONSULT NOTE - Initial Consult  Pharmacy Consult for Heparin Indication: pulmonary embolus  No Known Allergies  Patient Measurements: Height: 6' (182.9 cm) Weight: 288 lb (130.636 kg) IBW/kg (Calculated) : 77.6 Heparin Dosing Weight: 90 kg  Vital Signs: Temp: 97.3 F (36.3 C) (03/13 0432) Temp src: Oral (03/13 0432) BP: 131/84 mmHg (03/13 0432) Pulse Rate: 121 (03/13 0432)  Labs:  Recent Labs  03/16/12 1629 03/16/12 2246 03/17/12 0432  HGB 15.4  --  14.1  HCT 41.7  --  39.1  PLT 198  --  171  APTT 34  --   --   LABPROT 13.9  --   --   INR 1.08  --   --   CREATININE 1.01  --  0.83  TROPONINI  --  <0.30 <0.30    The CrCl is unknown because both a height and weight (above a minimum accepted value) are required for this calculation.   Medical History: Past Medical History  Diagnosis Date  . Arthritis   . Pain in joint, pelvic region and thigh   . Anxiety   . Hypertension   . Osteoporosis   . Sleep apnea   . Cataract   . Blood transfusion     Medications:  Prescriptions prior to admission  Medication Sig Dispense Refill  . amLODipine (NORVASC) 5 MG tablet Take 5 mg by mouth daily.      Marland Kitchen atenolol-chlorthalidone (TENORETIC) 100-25 MG per tablet Take 1 tablet by mouth daily.      . diazepam (VALIUM) 10 MG tablet Take 10 mg by mouth every 12 (twelve) hours as needed for anxiety.      Marland Kitchen escitalopram (LEXAPRO) 20 MG tablet Take 20 mg by mouth daily.      Marland Kitchen ibuprofen (ADVIL,MOTRIN) 200 MG tablet Take 800 mg by mouth every 6 (six) hours as needed for pain.      . Ibuprofen-Diphenhydramine HCl 200-25 MG CAPS Take 1-2 tablets by mouth at bedtime as needed.      . potassium chloride SA (K-DUR,KLOR-CON) 20 MEQ tablet Take 20 mEq by mouth 2 (two) times daily.      . traZODone (DESYREL) 50 MG tablet Take 50 mg by mouth at bedtime as needed for sleep.      Marland Kitchen oxyCODONE-acetaminophen (PERCOCET) 10-325 MG per tablet Take 1 tablet by mouth every 4 (four) hours  as needed for pain.       Admit Complaint: 62 y.o.  male  admitted 03/16/2012 with 10d worsening AMS, increasing back pain, noted to be in Afib in ED.  Pharmacy consulted to dose heparin for possible PE.  Assessment: Anticoagulation: R/O PE, new afib RVR, head CT negative for bleed.  CBC wnl.  No bleeding noted Infectious Disease: Afeb, WBC down from admission 14.5, now 9.3 Cardiovascular: HTN, hyperlipidemia : ASA, Dilt @15 ,  Gastrointestinal / Nutrition: diet Neurology/MSK: AMS, Delerium, Back pain, off medications for now Nephrology/Urology/Electrolytes: Hypokalemia K 2.4, replacing K PTA Medication Issues: Home Meds Not Ordered: No pain meds ordered, Lexapro, atenolol, chlorthalidone, amlodipine Best Practices: DVT Prophylaxis:  SQ heparin to IV  Goal of Therapy:  Heparin level 0.3-0.7 units/ml Monitor platelets by anticoagulation protocol: Yes   Plan:  1. Heparin 3000 units IV x 1 then 2. Heparin 1600 units/hr 3. Check heparin level 6h after ggt starts.  Daily heparin level and CBC 4. Follow up plan for long term anticoagulation  Thank you for allowing pharmacy to be a part of this patients care team.  Raynelle Fanning  [employer].D., BCPS Clinical Pharmacist 03/17/2012 8:22 AM Pager: [phone] Phone: [phone]

## 2012-03-17 NOTE — Progress Notes (Signed)
Family Medicine Teaching Service Daily Progress Note Service Page: [phone]  Subjective:  Feeling much improved this am. Denies pain, SOB this am.  Objective: Temp:  [97.3 F (36.3 C)-97.6 F (36.4 C)] 97.3 F (36.3 C) (03/13 0432) Pulse Rate:  [43-156] 121 (03/13 0432) Resp:  [12-25] 25 (03/13 0432) BP: (87-152)/(45-110) 131/84 mmHg (03/13 0432) SpO2:  [93 %-100 %] 96 % (03/13 0432)   Intake/Output Summary (Last 24 hours) at 03/17/12 0724 Last data filed at 03/17/12 0645  Gross per 24 hour  Intake      0 ml  Output    150 ml  Net   -150 ml   Exam: General: resting comfortably in bed, NAD. Cardiovascular: Irregularly, irregular. No murmurs, rubs, or gallops. Respiratory: CTAB. No rales, rhonchi, or wheeze. Abdomen: obese, soft, nontender, nondistended. Extremities: warm, well perfused.  Neuro: AO x 3. No focal deficits on exam. Psych: normal mood and affect.  CBC BMET   Recent Labs Lab 03/16/12 1629 03/17/12 0432  WBC 14.5* 9.3  HGB 15.4 14.1  HCT 41.7 39.1  PLT 198 171    Recent Labs Lab 03/16/12 1629 03/17/12 0432  NA 143 143  K 3.0* 2.4*  CL 103 105  CO2 22 24  BUN 24* 17  CREATININE 1.01 0.83  GLUCOSE 128* 117*  CALCIUM 9.7 9.0     BNP    Component Value Date/Time   PROBNP 4419.0* 03/16/2012 1630   Cardiac Panel (last 3 results)  Recent Labs  03/16/12 2246 03/17/12 0432  TROPONINI <0.30 <0.30   Lipid Panel     Component Value Date/Time   CHOL 170 03/17/2012 0432   TRIG 117 03/17/2012 0432   HDL 30* 03/17/2012 0432   CHOLHDL 5.7 03/17/2012 0432   VLDL 23 03/17/2012 0432   LDLCALC 117* 03/17/2012 0432   Imaging/Diagnostic Tests:  Dg Hip Complete Left 02/21/2012   IMPRESSION: No acute abnormality.  Right greater than left hip osteoarthritis. Proximal left femoral shaft malunion.      Ct Head Wo Contrast 03/17/2012  IMPRESSION: No acute intracranial abnormalities.    Ct Hip Left Wo Contrast 02/21/2012  IMPRESSION: Proximal left femoral  shaft deformity keeping with sequelae of prior fracture.  No displaced acute fracture is identified.  MRI has increased sensitivity for stress fracture/nondisplaced fracture if clinical concern persists.     Dg Chest Port 1 View 03/16/2012  IMPRESSION: Cardiomegaly, without acute disease.  Multifactorial degradation.  Assessment/Plan: 62 y.o. year old male with PMH of HTN who presented with approximately 1 week of worsening confusion, difficulty walking due to left back/hip/thigh pain and was reporting new onset of dizziness with subsequent findings of tachycardia and hypotension by his daughter who was a paramedic.  In the ED, patient found to be in Atrial fibrillation with RVR.  # Atrial fibrillation with RVR - CHADS VASC score of 1 (History of HTN). Unclear etiology at this time. - Patient was placed on Diltiazem gtt overnight and HR still difficult to control.  - Cardiology consulted today and patient started on Amiodarone gtt.  Cardizem now discontinued.  Metoprolol 25 mg BID started. - Awaiting Echo.  Afib could be secondary to PE.  D-Dimer obtained and was elevated at 1.78.  CTA ordered. - Awaiting TSH.  # Acute delirium - Unclear etiology.  CT head neg. Likely secondary to steroids and medications (trazodone, valium, increasing intake of pain medications).  - Now resolved.  - Holding pain/sedating meds at this time - CIWA protocol due to chronic  Diazepam use - Awaiting Urine Tox screen - Will monitor closely during admission  # Back/hip/leg pain with reported fecal incontinence - Patient has a history of femoral fracture with non-union and subsequent chronic pain.  Seen by [facility] and [facility].  Had steroid dose pack, hip injection, increased amount of pain medication and diazepam.  On admission, neuro exam revealed no deficits with good rectal tone. - Recent hip CT and xray negative for fracture - Obtaining MRI or lumbar spine, Left hip and femur today (this was  planned by patient's Neurosurgeon).  # HTN  - Currently stable this am - 131/84. - Will add back home medications as needed.  # HLD - Lipid panel revealed LDL of 117.  - Patient started on Lipitor 40 mg daily.   FEN/GI: NS @ 75 mL/hr PPx: Heparin gtt Dispo: Pending clinical improvement Code Status: Full code  Brandon [name] Other, DO 03/17/2012, 7:22 AM

## 2012-03-18 ENCOUNTER — Inpatient Hospital Stay (HOSPITAL_COMMUNITY): Payer: Federal, State, Local not specified - PPO

## 2012-03-18 DIAGNOSIS — F411 Generalized anxiety disorder: Secondary | ICD-10-CM

## 2012-03-18 DIAGNOSIS — R404 Transient alteration of awareness: Secondary | ICD-10-CM

## 2012-03-18 DIAGNOSIS — G8929 Other chronic pain: Secondary | ICD-10-CM

## 2012-03-18 DIAGNOSIS — I2699 Other pulmonary embolism without acute cor pulmonale: Secondary | ICD-10-CM

## 2012-03-18 DIAGNOSIS — I10 Essential (primary) hypertension: Secondary | ICD-10-CM

## 2012-03-18 DIAGNOSIS — I4891 Unspecified atrial fibrillation: Principal | ICD-10-CM

## 2012-03-18 LAB — BASIC METABOLIC PANEL
BUN: 8 mg/dL (ref 6–23)
BUN: 8 mg/dL (ref 6–23)
CO2: 26 mEq/L (ref 19–32)
CO2: 24 mEq/L (ref 19–32)
Calcium: 8.6 mg/dL (ref 8.4–10.5)
Calcium: 8.7 mg/dL (ref 8.4–10.5)
Chloride: 107 mEq/L (ref 96–112)
Chloride: 103 mEq/L (ref 96–112)
Creatinine, Ser: 0.72 mg/dL (ref 0.50–1.35)
Creatinine, Ser: 0.77 mg/dL (ref 0.50–1.35)
GFR calc Af Amer: >90 mL/min (ref >90)
GFR calc Af Amer: >90 mL/min (ref >90)
GFR calc non Af Amer: >90 mL/min (ref >90)
GFR calc non Af Amer: >90 mL/min (ref >90)
Glucose, Bld: 105 mg/dL — ABNORMAL HIGH (ref 70–99)
Glucose, Bld: 111 mg/dL — ABNORMAL HIGH (ref 70–99)
Potassium: 2.9 mEq/L — ABNORMAL LOW (ref 3.5–5.1)
Potassium: 3.3 mEq/L — ABNORMAL LOW (ref 3.5–5.1)
Sodium: 142 mEq/L (ref 135–145)
Sodium: 139 mEq/L (ref 135–145)

## 2012-03-18 LAB — CBC
HCT: 41.8 % (ref 39.0–52.0)
Hemoglobin: 14.8 g/dL (ref 13.0–17.0)
MCH: 30.3 pg (ref 26.0–34.0)
MCHC: 35.4 g/dL (ref 30.0–36.0)
MCV: 85.7 fL (ref 78.0–100.0)
Platelets: 172 10*3/uL (ref 150–400)
RBC: 4.88 MIL/uL (ref 4.22–5.81)
RDW: 13.4 % (ref 11.5–15.5)
WBC: 9.5 10*3/uL (ref 4.0–10.5)

## 2012-03-18 LAB — URINE DRUGS OF ABUSE SCREEN W ALC, ROUTINE (REF LAB)
Amphetamine Screen, Ur: NEGATIVE
Barbiturate Quant, Ur: NEGATIVE
Benzodiazepines.: POSITIVE — AB
Cocaine Metabolites: NEGATIVE
Creatinine,U: 169 mg/dL
Ethyl Alcohol: <10 mg/dL (ref <10)
Marijuana Metabolite: POSITIVE — AB
Methadone: NEGATIVE
Opiate Screen, Urine: NEGATIVE
Phencyclidine (PCP): NEGATIVE
Propoxyphene: NEGATIVE

## 2012-03-18 LAB — HEPARIN LEVEL (UNFRACTIONATED)
Heparin Unfractionated: 0.44 IU/mL (ref 0.30–0.70)
Heparin Unfractionated: <0.1 IU/mL — ABNORMAL LOW (ref 0.30–0.70)

## 2012-03-18 MED ORDER — METOPROLOL TARTRATE 1 MG/ML IV SOLN
5.0000 mg | Freq: Four times a day (QID) | INTRAVENOUS | Status: DC | PRN
  Administered 2012-03-18 – 2012-03-21 (×4): 5 mg via INTRAVENOUS
  Filled 2012-03-18 (×2): qty 5

## 2012-03-18 MED ORDER — POTASSIUM CHLORIDE CRYS ER 20 MEQ PO TBCR
40.0000 meq | EXTENDED_RELEASE_TABLET | ORAL | Status: AC
  Administered 2012-03-18 (×3): 40 meq via ORAL
  Filled 2012-03-18: qty 1
  Filled 2012-03-18 (×3): qty 2

## 2012-03-18 MED ORDER — LORAZEPAM 1 MG PO TABS
2.0000 mg | ORAL_TABLET | Freq: Four times a day (QID) | ORAL | Status: DC | PRN
  Administered 2012-03-18 – 2012-03-19 (×2): 2 mg via ORAL
  Filled 2012-03-18 (×3): qty 2

## 2012-03-18 MED ORDER — MAGNESIUM SULFATE 40 MG/ML IJ SOLN
2.0000 g | Freq: Once | INTRAMUSCULAR | Status: AC
  Administered 2012-03-18: 2 g via INTRAVENOUS
  Filled 2012-03-18: qty 50

## 2012-03-18 MED ORDER — RIVAROXABAN 15 MG PO TABS
15.0000 mg | ORAL_TABLET | Freq: Two times a day (BID) | ORAL | Status: DC
  Administered 2012-03-18 – 2012-03-22 (×8): 15 mg via ORAL
  Filled 2012-03-18 (×12): qty 1

## 2012-03-18 MED ORDER — ENOXAPARIN SODIUM 150 MG/ML ~~LOC~~ SOLN
1.5000 mg/kg | SUBCUTANEOUS | Status: DC
  Filled 2012-03-18 (×2): qty 2

## 2012-03-18 MED ORDER — AMIODARONE HCL 200 MG PO TABS
400.0000 mg | ORAL_TABLET | Freq: Two times a day (BID) | ORAL | Status: DC
  Administered 2012-03-18 – 2012-03-20 (×6): 400 mg via ORAL
  Filled 2012-03-18 (×8): qty 2

## 2012-03-18 MED ORDER — AMLODIPINE BESYLATE 5 MG PO TABS
5.0000 mg | ORAL_TABLET | Freq: Every day | ORAL | Status: DC
  Administered 2012-03-18: 5 mg via ORAL
  Filled 2012-03-18: qty 1

## 2012-03-18 MED ORDER — METOPROLOL TARTRATE 50 MG PO TABS
50.0000 mg | ORAL_TABLET | Freq: Two times a day (BID) | ORAL | Status: DC
  Administered 2012-03-18 – 2012-03-20 (×5): 50 mg via ORAL
  Filled 2012-03-18 (×7): qty 1

## 2012-03-18 MED ORDER — POTASSIUM CHLORIDE CRYS ER 20 MEQ PO TBCR
40.0000 meq | EXTENDED_RELEASE_TABLET | ORAL | Status: AC
  Administered 2012-03-18 – 2012-03-19 (×2): 40 meq via ORAL
  Filled 2012-03-18 (×2): qty 2

## 2012-03-18 MED ORDER — ENOXAPARIN SODIUM 150 MG/ML ~~LOC~~ SOLN
1.0000 mg/kg | Freq: Two times a day (BID) | SUBCUTANEOUS | Status: DC
  Administered 2012-03-18: 130 mg via SUBCUTANEOUS
  Filled 2012-03-18 (×3): qty 1

## 2012-03-18 MED ORDER — LORAZEPAM 1 MG PO TABS: 1.0000 mg | ORAL_TABLET | Freq: Four times a day (QID) | ORAL | Status: DC | PRN

## 2012-03-18 MED ORDER — DIGOXIN 0.25 MG/ML IJ SOLN
0.2500 mg | Freq: Once | INTRAMUSCULAR | Status: AC
  Administered 2012-03-19: 0.25 mg via INTRAVENOUS
  Filled 2012-03-18: qty 1

## 2012-03-18 MED ORDER — LORAZEPAM 1 MG PO TABS: 2.0000 mg | ORAL_TABLET | Freq: Four times a day (QID) | ORAL | Status: DC

## 2012-03-18 NOTE — Progress Notes (Signed)
FMTS Attending Admission Note: Janit Pagan I  have seen and examined this patient, reviewed their chart. I have discussed this patient with the resident. I agree with the resident's findings, assessment and care plan.  Patient awake and alert this morning with good communication.He stated he feels much better today,his wife mentioned he was a little out of it earlier on. I agree with the resident plan and management,plan to start patient on Xarelto or coumadin for PE and Afib after consulting with the cardiologist.   NB: Thyroid nodule noted on CTA,upon d/c home plan to have patient get thyroid scan done by his PMD if not done during this admission.

## 2012-03-18 NOTE — Progress Notes (Signed)
Right:  DVT noted in the peroneal vein.  No evidence of superficial thrombosis.  No Baker's cyst.  Left:  No evidence of DVT, superficial thrombosis, or Baker's cyst.

## 2012-03-18 NOTE — Progress Notes (Signed)
Patient ID: Brandon [name], male   DOB: Jan 18, 1950, 62 y.o.   MRN: [id number] BP 114/65  Pulse 142  Temp(Src) 98.1 F (36.7 C) (Oral)  Resp 17  Ht 6' (1.829 m)  Wt 130.636 kg (288 lb)  BMI 39.05 kg/m2  SpO2 94% Alert and oriented today. Following all commands Moving lower extremities.  MRI shows a disc herniation at L4/5 on the left.  When he is a surgical candidate will be happy to decompress him via a laminectomy.  Will see again on Monday.

## 2012-03-18 NOTE — Progress Notes (Signed)
Heparin level with am labs reported as undetectable; per RN lost two IV lines so amio was the only thing that could run, when IV was re-established heparin infiltrated in area between size of golfball and baseball, IV team now established line and heparin resumed; will continue at current rate for now given one lab at goal and recheck after running x6hr; as heparin is absorbed from infiltration may affect level.  Vernard Gambles, PharmD, BCPS 03/18/2012 6:14 AM

## 2012-03-18 NOTE — Progress Notes (Signed)
HR maintaining in the 130-140s. Dr. Mikel Cella notified to clarify lopressor 2.5 mg IV PRN order (was ordered only for high blood pressure). Dr. Mikel Cella ordered 5 mg IV PRN q6hr for high blood pressure and HR >120. Also wanted an EKG. Will administer and continue to monitor patient.   Rochele Pages, RN

## 2012-03-18 NOTE — Progress Notes (Signed)
Subjective:  Patient denies any chest pain or shortness of breath states feels better today. Remains in atrial fibrillation with moderate ventricular response with occasional rapid ventricular response. CT of the chest result noted 2-D echo showed no evidence of right heart strain  Objective:  Vital Signs in the last 24 hours: Temp:  [97.3 F (36.3 C)-98.3 F (36.8 C)] 98.1 F (36.7 C) (03/14 1251) Pulse Rate:  [60-161] 142 (03/14 1112) Resp:  [16-32] 17 (03/14 0600) BP: (102-140)/(62-91) 114/65 mmHg (03/14 1112) SpO2:  [91 %-96 %] 94 % (03/14 0600)  Intake/Output from previous day: 03/13 0701 - 03/14 0700 In: 1857.2 [I.V.:1857.2] Out: 850 [Urine:850] Intake/Output from this shift: Total I/O In: -  Out: 825 [Urine:825]  Physical Exam: Neck: no adenopathy, no carotid bruit, no JVD and supple, symmetrical, trachea midline Lungs: clear to auscultation bilaterally Heart: irregularly irregular rhythm, S1, S2 normal and No S3 gallop Abdomen: soft, non-tender; bowel sounds normal; no masses,  no organomegaly Extremities: extremities normal, atraumatic, no cyanosis or edema  Lab Results:  Recent Labs  03/17/12 0432 03/18/12 0440  WBC 9.3 9.5  HGB 14.1 14.8  PLT 171 172    Recent Labs  03/17/12 1614 03/18/12 0725  NA 142 142  K 3.3* 2.9*  CL 105 107  CO2 28 26  GLUCOSE 109* 105*  BUN 12 8  CREATININE 0.92 0.72    Recent Labs  03/17/12 0905 03/17/12 1001  TROPONINI <0.30 <0.30   Hepatic Function Panel  Recent Labs  03/17/12 0432  PROT 6.5  ALBUMIN 3.5  AST 19  ALT 13  ALKPHOS 71  BILITOT 0.7    Recent Labs  03/17/12 0432  CHOL 170   No results found for this basename: PROTIME,  in the last 72 hours  Imaging: Imaging results have been reviewed and Ct Head Wo Contrast  03/17/2012  *RADIOLOGY REPORT*  Clinical Data:  Altered mental status, disorientation, new nystagmus, leukocytosis  CT HEAD WITHOUT CONTRAST  Technique:  Contiguous axial images  were obtained from the base of the skull through the vertex without contrast.  Comparison: None  Findings: Mild atrophy. Normal ventricular morphology. No midline shift or mass effect. No definite intracranial hemorrhage, mass lesion or evidence of acute infarction. No extra-axial fluid collections. Few scattered streak artifacts. Mucosal thickening in ethmoid air cells bilaterally. Calvaria intact.  IMPRESSION: No acute intracranial abnormalities.   Original Report Authenticated By: Ulyses Southward, M.D.    Ct Angio Chest Pe W/cm &/or Wo Cm  03/17/2012  *RADIOLOGY REPORT*  Clinical Data: Irregular heart rate.  CT ANGIOGRAPHY CHEST  Technique:  Multidetector CT imaging of the chest using the standard protocol during bolus administration of intravenous contrast. Multiplanar reconstructed images including MIPs were obtained and reviewed to evaluate the vascular anatomy.  Contrast: OMNIPAQUE IOHEXOL 350 MG/ML SOLN  Comparison: None.  Findings: Respiratory motion degrades image quality.  There are filling defects in the pulmonary arterial tree bilaterally, with the most proximal clot seen in the anterior segmental bronchus of the left upper lobe.  No evidence of right heart strain.  Low attenuation lesion in the right lobe of the thyroid measures approximate 1.7 cm.  No pathologically enlarged mediastinal, hilar or axillary lymph nodes.  Heart is at the upper limits of normal in size to mildly enlarged.  No pericardial effusion.  Calcified granuloma in the right upper lobe.  Again, respiratory motion degrades image quality. Mild dependent atelectasis bilaterally.  No pleural fluid.  Airway is unremarkable.  Incidental  imaging of the upper abdomen shows no acute findings. No worrisome lytic or sclerotic lesions.  IMPRESSION:  1.  Bilateral pulmonary emboli with the most proximal clot seen at the segmental level on the left.  No evidence of right heart strain. These results will be called to the ordering clinician or  representative by the Radiologist Assistant, and communication documented in the PACS Dashboard. 2.  Right thyroid nodule. Consider further evaluation with thyroid ultrasound.  If patient is clinically hyperthyroid, consider nuclear medicine thyroid uptake and scan.   Original Report Authenticated By: Leanna Battles, M.D.    Dg Chest Port 1 View  03/16/2012  *RADIOLOGY REPORT*  Clinical Data: Chest pain, shortness of breath.  PORTABLE CHEST - 1 VIEW  Comparison: 12/18/2005  Findings: Both views are degraded by patient body habitus, AP portable technique, and overlying EKG leads.  The far inferior lateral right chest is excluded from both images.  Mild cardiomegaly.  Remote right clavicular fracture with non union. No pleural effusion or pneumothorax.  No congestive failure.  Calcified granuloma in the right upper lobe laterally.  IMPRESSION: Cardiomegaly, without acute disease.  Multifactorial degradation.   Original Report Authenticated By: Jeronimo Greaves, M.D.     Cardiac Studies:  Assessment/Plan:  Bilateral pulmonary embolism New-onset A. fib with RVR secondary to above Hypertension Hypokalemia Chronic pain syndrome Anxiety disorder Status post delirium secondary to meds Plan DC IV amiodarone change it to by mouth 400 mg twice daily for one week and then 200 mg twice daily Increase Lopressor as per orders Hold Norvasc for now Replace K. Okay to start xareltto Dr. Algie Coffer on call for me for weekend  LOS: 2 days    Brandon [name] N 03/18/2012, 1:03 PM

## 2012-03-18 NOTE — Progress Notes (Signed)
Family Medicine Teaching Service Daily Progress Note Service Page: [phone]  Subjective:  Feeling well this am.  No complaints.  Asking to be taken off bed rest. Overnight events: IV infiltrated causing swelling of right forearm.  Patient also noted to be confused by family early this am.  Objective: Temp:  [97.3 F (36.3 C)-98.3 F (36.8 C)] 97.3 F (36.3 C) (03/14 0400) Pulse Rate:  [43-161] 62 (03/14 0600) Resp:  [14-33] 17 (03/14 0600) BP: (102-140)/(62-91) 140/91 mmHg (03/14 0400) SpO2:  [89 %-96 %] 94 % (03/14 0600)   Intake/Output Summary (Last 24 hours) at 03/18/12 0720 Last data filed at 03/18/12 0600  Gross per 24 hour  Intake 1857.2 ml  Output    850 ml  Net 1007.2 ml   Exam: General: resting comfortably in bed, NAD. Cardiovascular: Irregularly, irregular. No murmurs, rubs, or gallops. Respiratory: CTAB. No rales, rhonchi, or wheeze. Abdomen: obese, soft, nontender, nondistended. Extremities: warm, well perfused.  Neuro: AO x 3. No focal deficits on exam.  CBC BMET   Recent Labs Lab 03/16/12 1629 03/17/12 0432 03/18/12 0440  WBC 14.5* 9.3 9.5  HGB 15.4 14.1 14.8  HCT 41.7 39.1 41.8  PLT 198 171 172    Recent Labs Lab 03/16/12 1629 03/17/12 0432 03/17/12 1614  NA 143 143 142  K 3.0* 2.4* 3.3*  CL 103 105 105  CO2 22 24 28   BUN 24* 17 12  CREATININE 1.01 0.83 0.92  GLUCOSE 128* 117* 109*  CALCIUM 9.7 9.0 9.0     BNP    Component Value Date/Time   PROBNP 4419.0* 03/16/2012 1630   Cardiac Panel (last 3 results)  Recent Labs  03/17/12 0432 03/17/12 0905 03/17/12 1001  TROPONINI <0.30 <0.30 <0.30   Lipid Panel     Component Value Date/Time   CHOL 170 03/17/2012 0432   TRIG 117 03/17/2012 0432   HDL 30* 03/17/2012 0432   CHOLHDL 5.7 03/17/2012 0432   VLDL 23 03/17/2012 0432   LDLCALC 117* 03/17/2012 0432   Imaging/Diagnostic Tests:  Ct Angio Chest Pe W/cm &/or Wo Cm 03/17/2012   IMPRESSION:  1.  Bilateral pulmonary emboli with the  most proximal clot seen at the segmental level on the left.  No evidence of right heart strain. These results will be called to the ordering clinician or representative by the Radiologist Assistant, and communication documented in the PACS Dashboard. 2.  Right thyroid nodule. Consider further evaluation with thyroid ultrasound.  If patient is clinically hyperthyroid, consider nuclear medicine thyroid uptake and scan.     Dg Hip Complete Left 02/21/2012   IMPRESSION: No acute abnormality.  Right greater than left hip osteoarthritis. Proximal left femoral shaft malunion.      Ct Head Wo Contrast 03/17/2012  IMPRESSION: No acute intracranial abnormalities.    Ct Hip Left Wo Contrast 02/21/2012  IMPRESSION: Proximal left femoral shaft deformity keeping with sequelae of prior fracture.  No displaced acute fracture is identified.  MRI has increased sensitivity for stress fracture/nondisplaced fracture if clinical concern persists.     Dg Chest Port 1 View 03/16/2012  IMPRESSION: Cardiomegaly, without acute disease.  Multifactorial degradation.  2D Echo: Study Conclusions - Left ventricle: The cavity size was normal. There was moderate concentric hypertrophy. Systolic function was vigorous. The estimated ejection fraction was in the range of 65% to 70%. Wall motion was normal; there were no regional wall motion abnormalities. - Aortic root: The aortic root was moderately dilated. - Left atrium: The atrium was moderately  to severely dilated. - Right atrium: The atrium was mildly dilated.  Assessment/Plan: 62 y.o. year old male with PMH of HTN who presented with approximately 1 week of worsening confusion, difficulty walking due to left back/hip/thigh pain and was reporting new onset of dizziness with subsequent findings of tachycardia and hypotension by his daughter who was a paramedic.  In the ED, patient found to be in Atrial fibrillation with RVR.  # Atrial fibrillation with RVR - CHADS VASC score  of 1 (History of HTN). Likely secondary to PE (see below).  TSH normal at 2.247.  - Patient was placed on Diltiazem gtt intially. - Cardiology consulted on 3/13 and patient started on Amiodarone gtt and Metoprolol 25 mg BID - Rate controlled this am (106), but patient is still on bed rest. - Will continue amiodarone and metoprolol per cardiology.  We greatly appreciate their help in managing this patient.   # Pulmonary Embolus - bilateral - D dimer was obtained and was elevated at 1.78. - CTA was obtained on 3/13 and revealed bilateral pulmonary emboli. - Patient was on Heparin gtt prior to study.  IV infiltrated last night.  Patient started on BID Lovenox today.  # Acute delirium - Unclear etiology.  CT head neg. Likely secondary to steroids and medications (trazodone, valium, increasing intake of pain medications).  - Occurs intermittently. Per family patient was confused early this am.  However, on PE this morning he was AO x 3.  - Holding pain/sedating meds at this time - PRN Ativan for Agitation/potential Benzo withdrawal. - Urine Tox - positive for marijuana metabolite and Benzo's - Will continue monitor closely during admission  # Back/hip/leg pain with reported fecal incontinence - Patient has a history of femoral fracture with non-union and subsequent chronic pain.  Seen by [facility] and [facility].  Had steroid dose pack, hip injection, increased amount of pain medication and diazepam.  - Recent hip CT and xray negative for fracture - Obtaining MRI or lumbar spine, Left hip and femur (this was planned by patient's Neurosurgeon).  This was unable to be obtained yesterday.  # HTN  - Metoprolol 25 mg BID. - Restarting home Amlodipine 5 mg daily.  # HLD - Lipid panel revealed LDL of 117.  - Patient started on Lipitor 40 mg daily.   # Hypokalemia - K+ 2.9 this am. - Repleting with 40 mEq x 3 today. - Magnesium 1.8 yesterday. Repleting today.  FEN/GI: NS @  KVO PPx: Lovenox BID Dispo: Pending clinical improvement Code Status: Full code  Everlene Other, DO 03/18/2012, 7:20 AM

## 2012-03-18 NOTE — Progress Notes (Signed)
Three IV sites present at the beginning of shift. Right wrist and right antecubital sites occluded. Heparin drip was shut off. Amiodarone was started in right distal hand site. Another 22G PIV site was started in the right posterior forearm. When inserted blood return was noted and flushed well. At 0430 it was noted that right posterior forearm PIV had infiltrated. PIV removed. IV therapy was consulted, stated that a 2x2 gauze should be applied to insertion site and pressure dressing applied. Dr. Mikel Cella was notified. No new orders received. IV attempted to be reinserted by Chrissie Noa, RN. Attempt was unsuccessful. IV therapy paged. Amio gtt continue to infuse. Will continue to monitor.  Rochele Pages, RN
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
## 2012-03-18 NOTE — Progress Notes (Signed)
ANTICOAGULATION CONSULT NOTE - Follow Up Consult  Pharmacy Consult for heparin Indication: atrial fibrillation and pulmonary embolus  Labs:  Recent Labs  03/16/12 1629  03/17/12 0432 03/17/12 0905 03/17/12 1001 03/17/12 1614 03/17/12 1627 03/17/12 2301  HGB 15.4  --  14.1  --   --   --   --   --   HCT 41.7  --  39.1  --   --   --   --   --   PLT 198  --  171  --   --   --   --   --   APTT 34  --   --   --   --   --   --   --   LABPROT 13.9  --   --   --   --   --   --   --   INR 1.08  --   --   --   --   --   --   --   HEPARINUNFRC  --   --   --   --   --   --  0.21* 0.44  CREATININE 1.01  --  0.83  --   --  0.92  --   --   TROPONINI  --   < > <0.30 <0.30 <0.30  --   --   --   < > = values in this interval not displayed.   Assessment/Plan:  62yo male now therapeutic on heparin after rate increase.  Will continue gtt at current rate and confirm stable with am labs.  Vernard Gambles, PharmD, BCPS  03/18/2012,12:16 AM

## 2012-03-19 ENCOUNTER — Inpatient Hospital Stay (HOSPITAL_COMMUNITY): Payer: Federal, State, Local not specified - PPO

## 2012-03-19 DIAGNOSIS — Z86711 Personal history of pulmonary embolism: Secondary | ICD-10-CM

## 2012-03-19 DIAGNOSIS — I2699 Other pulmonary embolism without acute cor pulmonale: Secondary | ICD-10-CM | POA: Insufficient documentation

## 2012-03-19 DIAGNOSIS — M79609 Pain in unspecified limb: Secondary | ICD-10-CM

## 2012-03-19 HISTORY — DX: Personal history of pulmonary embolism: Z86.711

## 2012-03-19 LAB — BASIC METABOLIC PANEL
BUN: 9 mg/dL (ref 6–23)
CO2: 25 mEq/L (ref 19–32)
Calcium: 9.1 mg/dL (ref 8.4–10.5)
Chloride: 105 mEq/L (ref 96–112)
Creatinine, Ser: 0.9 mg/dL (ref 0.50–1.35)
GFR calc Af Amer: >90 mL/min (ref >90)
GFR calc non Af Amer: 89 mL/min — ABNORMAL LOW (ref >90)
Glucose, Bld: 114 mg/dL — ABNORMAL HIGH (ref 70–99)
Potassium: 3.8 mEq/L (ref 3.5–5.1)
Sodium: 140 mEq/L (ref 135–145)

## 2012-03-19 LAB — CBC
HCT: 38.9 % — ABNORMAL LOW (ref 39.0–52.0)
Hemoglobin: 13.7 g/dL (ref 13.0–17.0)
MCH: 30.1 pg (ref 26.0–34.0)
MCHC: 35.2 g/dL (ref 30.0–36.0)
MCV: 85.5 fL (ref 78.0–100.0)
Platelets: 146 10*3/uL — ABNORMAL LOW (ref 150–400)
RBC: 4.55 MIL/uL (ref 4.22–5.81)
RDW: 13.4 % (ref 11.5–15.5)
WBC: 8.5 10*3/uL (ref 4.0–10.5)

## 2012-03-19 LAB — CLOSTRIDIUM DIFFICILE BY PCR: Toxigenic C. Difficile by PCR: NEGATIVE

## 2012-03-19 MED ORDER — DIGOXIN 0.25 MG/ML IJ SOLN
0.2500 mg | Freq: Once | INTRAMUSCULAR | Status: AC
  Administered 2012-03-19: 0.25 mg via INTRAVENOUS
  Filled 2012-03-19: qty 1

## 2012-03-19 MED ORDER — ESCITALOPRAM OXALATE 20 MG PO TABS
20.0000 mg | ORAL_TABLET | Freq: Every day | ORAL | Status: DC
  Administered 2012-03-19 – 2012-03-22 (×4): 20 mg via ORAL
  Filled 2012-03-19 (×4): qty 1

## 2012-03-19 NOTE — Progress Notes (Signed)
FMTS Attending Admission Note: Kehinde Eniola,MD  Patient was seen and examined by Dr Breen, I reviewed their chart. I have discussed this patient with the resident. I agree with the resident's findings, assessment and care plan. 

## 2012-03-19 NOTE — Consult Note (Signed)
Subjective:  Remains in atrial fibrillation with heart rate near 100 but increases with activity. Afebrile. Echocardiogram with bi-atrial dilatation, left more than right. No chest pain. Improving oral intake.  Objective:  Vital Signs in the last 24 hours: Temp:  [97.8 F (36.6 C)-98.8 F (37.1 C)] 98.8 F (37.1 C) (03/15 0418) Pulse Rate:  [127-147] 134 (03/15 0557) Cardiac Rhythm:  [-] Atrial fibrillation (03/15 0012) Resp:  [18-27] 27 (03/15 0557) BP: (108-132)/(65-109) 132/76 mmHg (03/15 0557) SpO2:  [94 %-98 %] 97 % (03/15 0557)  Physical Exam: BP Readings from Last 1 Encounters:  03/19/12 132/76     Wt Readings from Last 1 Encounters:  03/17/12 130.636 kg (288 lb)    Weight change:   HEENT: [place]/AT, Eyes-Hazel, PERL, EOMI, Conjunctiva-Pink, Sclera-Non-icteric Neck: No JVD, No bruit, Trachea midline. Lungs:  Clear, Bilateral. Cardiac:  Regular rhythm, normal S1 and S2, no S3.  Abdomen:  Soft, non-tender but distended. Extremities:  No edema present. No cyanosis. No clubbing. CNS: AxOx3, Cranial nerves grossly intact, moves all 4 extremities. Right handed. Skin: Warm and dry.   Intake/Output from previous day: 03/14 0701 - 03/15 0700 In: 350 [P.O.:350] Out: 1025 [Urine:1025]    Lab Results: BMET    Component Value Date/Time   NA 139 03/18/2012 1510   K 3.3* 03/18/2012 1510   CL 103 03/18/2012 1510   CO2 24 03/18/2012 1510   GLUCOSE 111* 03/18/2012 1510   BUN 8 03/18/2012 1510   CREATININE 0.77 03/18/2012 1510   CALCIUM 8.7 03/18/2012 1510   GFRNONAA >90 03/18/2012 1510   GFRAA >90 03/18/2012 1510   CBC    Component Value Date/Time   WBC 8.5 03/19/2012 0505   RBC 4.55 03/19/2012 0505   HGB 13.7 03/19/2012 0505   HCT 38.9* 03/19/2012 0505   PLT 146* 03/19/2012 0505   MCV 85.5 03/19/2012 0505   MCH 30.1 03/19/2012 0505   MCHC 35.2 03/19/2012 0505   RDW 13.4 03/19/2012 0505   LYMPHSABS 2.1 02/07/2010 2322   MONOABS 0.8 02/07/2010 2322   EOSABS 0.2 02/07/2010 2322   BASOSABS  0.1 02/07/2010 2322   CARDIAC ENZYMES Lab Results  Component Value Date   TROPONINI <0.30 03/17/2012    Scheduled Meds: . amiodarone  400 mg Oral BID  . aspirin EC  81 mg Oral Daily  . atorvastatin  40 mg Oral q1800  . digoxin  0.25 mg Intravenous Once  . metoprolol tartrate  50 mg Oral BID  . rivaroxaban  15 mg Oral BID WC  . sodium chloride  3 mL Intravenous Q12H   Continuous Infusions: . sodium chloride 20 mL/hr (03/18/12 1115)   PRN Meds:.LORazepam, metoprolol  Assessment/Plan: New-onset A. fib with RVR with recently being bedbound rule out PE  Hypertension  Anxiety disorder  Chronic pain syndrome  Status post delirium secondary to meds.  Continue lanoxin for heart rate control. Consider TEE with cardioversion. Continue Xarelto.   LOS: 3 days    Orpah Cobb  MD  03/19/2012, 9:17 AM
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
## 2012-03-20 ENCOUNTER — Inpatient Hospital Stay (HOSPITAL_COMMUNITY): Payer: Federal, State, Local not specified - PPO

## 2012-03-20 DIAGNOSIS — I82409 Acute embolism and thrombosis of unspecified deep veins of unspecified lower extremity: Secondary | ICD-10-CM

## 2012-03-20 LAB — CBC
HCT: 39.5 % (ref 39.0–52.0)
Hemoglobin: 14.3 g/dL (ref 13.0–17.0)
MCH: 30.6 pg (ref 26.0–34.0)
MCHC: 36.2 g/dL — ABNORMAL HIGH (ref 30.0–36.0)
MCV: 84.4 fL (ref 78.0–100.0)
Platelets: 163 10*3/uL (ref 150–400)
RBC: 4.68 MIL/uL (ref 4.22–5.81)
RDW: 13.2 % (ref 11.5–15.5)
WBC: 10 10*3/uL (ref 4.0–10.5)

## 2012-03-20 LAB — BASIC METABOLIC PANEL
BUN: 11 mg/dL (ref 6–23)
CO2: 25 mEq/L (ref 19–32)
Calcium: 9 mg/dL (ref 8.4–10.5)
Chloride: 103 mEq/L (ref 96–112)
Creatinine, Ser: 0.93 mg/dL (ref 0.50–1.35)
GFR calc Af Amer: >90 mL/min (ref >90)
GFR calc non Af Amer: 88 mL/min — ABNORMAL LOW (ref >90)
Glucose, Bld: 98 mg/dL (ref 70–99)
Potassium: 3 mEq/L — ABNORMAL LOW (ref 3.5–5.1)
Sodium: 140 mEq/L (ref 135–145)

## 2012-03-20 LAB — MAGNESIUM: Magnesium: 1.9 mg/dL (ref 1.5–2.5)

## 2012-03-20 MED ORDER — POTASSIUM CHLORIDE CRYS ER 20 MEQ PO TBCR
40.0000 meq | EXTENDED_RELEASE_TABLET | ORAL | Status: AC
  Administered 2012-03-20 (×3): 40 meq via ORAL
  Filled 2012-03-20 (×3): qty 2

## 2012-03-20 MED ORDER — DIGOXIN 0.25 MG/ML IJ SOLN
0.2500 mg | Freq: Once | INTRAMUSCULAR | Status: AC
  Administered 2012-03-20: 0.25 mg via INTRAVENOUS
  Filled 2012-03-20: qty 1

## 2012-03-20 MED ORDER — POLYETHYLENE GLYCOL 3350 17 G PO PACK
17.0000 g | PACK | Freq: Every day | ORAL | Status: DC
  Administered 2012-03-20 – 2012-03-22 (×3): 17 g via ORAL
  Filled 2012-03-20 (×3): qty 1

## 2012-03-20 NOTE — Progress Notes (Signed)
FMTS Attending Note Patient seen and examined by me today, I reviewed Dr Patsey Berthold note and agree with his assessment and plan. Patient is not as emotionally labile this morning, feels well.  HR has been a little better controlled by vital sign flow sheet since midnight; in the 90s-100s on monitor during my visit.  Patient is on metoprolol 50mg  bid and amiodarone orally at this point.  Xarelto for PE's in patient with newly diagnosed AF.  WIll defer to Cardiology regarding timing and appropriateness of electric cardioversion. WIll continue Lexapro scheduled and have CIWA with ativan available in this patient with likely BNZ tolerance and dependence. Paula Compton, MD

## 2012-03-20 NOTE — Progress Notes (Signed)
Family Medicine Teaching Service Daily Progress Note Service Page: [phone]  Subjective:  Reports R wrist pain this am.  Denies SOB, chest pain.  Requesting laxative th am.  Objective: Temp:  [97.4 F (36.3 C)-99.8 F (37.7 C)] 97.4 F (36.3 C) (03/16 0401) Pulse Rate:  [31-127] 95 (03/16 0401) Resp:  [13-20] 20 (03/16 0401) BP: (107-125)/(68-96) 124/93 mmHg (03/16 0401) SpO2:  [90 %-96 %] 90 % (03/16 0401)   Intake/Output Summary (Last 24 hours) at 03/20/12 0646 Last data filed at 03/20/12 4540  Gross per 24 hour  Intake    723 ml  Output   1425 ml  Net   -702 ml   Exam: General: resting comfortably in bed, NAD.  Cardiovascular: Irregularly, irregular. No murmurs, rubs, or gallops. Respiratory: CTAB. No rales, rhonchi, or wheeze. Abdomen: obese, soft, nontender, nondistended. Extremities: right medial wrist with erythema and swelling over ulnar styloid. No bruising appreciated.  No LE edema appreciated. Neuro: AO x 3. No focal deficits on exam.  CBC BMET   Recent Labs Lab 03/18/12 0440 03/19/12 0505 03/20/12 0424  WBC 9.5 8.5 10.0  HGB 14.8 13.7 14.3  HCT 41.8 38.9* 39.5  PLT 172 146* 163    Recent Labs Lab 03/18/12 1510 03/19/12 0919 03/20/12 0424  NA 139 140 140  K 3.3* 3.8 3.0*  CL 103 105 103  CO2 24 25 25   BUN 8 9 11   CREATININE 0.77 0.90 0.93  GLUCOSE 111* 114* 98  CALCIUM 8.7 9.1 9.0     BNP    Component Value Date/Time   PROBNP 4419.0* 03/16/2012 1630   Cardiac Panel (last 3 results)  Recent Labs  03/17/12 0905 03/17/12 1001  TROPONINI <0.30 <0.30   Lipid Panel     Component Value Date/Time   CHOL 170 03/17/2012 0432   TRIG 117 03/17/2012 0432   HDL 30* 03/17/2012 0432   CHOLHDL 5.7 03/17/2012 0432   VLDL 23 03/17/2012 0432   LDLCALC 117* 03/17/2012 0432   Imaging/Diagnostic Tests:  Ct Angio Chest Pe W/cm &/or Wo Cm 03/17/2012   IMPRESSION:  1.  Bilateral pulmonary emboli with the most proximal clot seen at the segmental level  on the left.  No evidence of right heart strain. These results will be called to the ordering clinician or representative by the Radiologist Assistant, and communication documented in the PACS Dashboard. 2.  Right thyroid nodule. Consider further evaluation with thyroid ultrasound.  If patient is clinically hyperthyroid, consider nuclear medicine thyroid uptake and scan.     Dg Hip Complete Left 02/21/2012   IMPRESSION: No acute abnormality.  Right greater than left hip osteoarthritis. Proximal left femoral shaft malunion.      Ct Head Wo Contrast 03/17/2012  IMPRESSION: No acute intracranial abnormalities.    Ct Hip Left Wo Contrast 02/21/2012  IMPRESSION: Proximal left femoral shaft deformity keeping with sequelae of prior fracture.  No displaced acute fracture is identified.  MRI has increased sensitivity for stress fracture/nondisplaced fracture if clinical concern persists.     Dg Chest Port 1 View 03/16/2012  IMPRESSION: Cardiomegaly, without acute disease.  Multifactorial degradation.  2D Echo: Study Conclusions - Left ventricle: The cavity size was normal. There was moderate concentric hypertrophy. Systolic function was vigorous. The estimated ejection fraction was in the range of 65% to 70%. Wall motion was normal; there were no regional wall motion abnormalities. - Aortic root: The aortic root was moderately dilated. - Left atrium: The atrium was moderately to severely dilated. -  Right atrium: The atrium was mildly dilated.  MRI Lumbar Spine New left L4-L5 left foraminal disc herniation, likely compressing L4 nerve root and could explain symptoms.   Assessment/Plan: 61 y.o. year old male with PMH of HTN who presented with approximately 1 week of worsening confusion, difficulty walking due to left back/hip/thigh pain and was reporting new onset of dizziness with subsequent findings of tachycardia and hypotension by his daughter who was a paramedic.  In the ED, patient found to be in  Atrial fibrillation with RVR.  # Atrial fibrillation with RVR - CHADS VASC score of 1 (History of HTN). Likely secondary to PE (see below).  TSH normal at 2.247.  - Patient was placed on Diltiazem gtt intially.  Subsequently started on Amiodarone for rate control. - Cardiology following and we greatly appreciate their help in managing this patient. - Patient rate controlled this am (HR 95, ECG 99) - Continue therapy per cardiology - Metoprolol 50 BID, Amiodarone 400 mg BID.  - Will likely need to add scheduled Digoxin for continued rate control.  Possible TEE/Cardioversion this admission.  # Pulmonary Embolus - bilateral - D dimer was obtained and was elevated at 1.78. - CTA was obtained on 3/13 and revealed bilateral pulmonary emboli. - Rivaroxaban 15 mg BID started on 3/14, cont for 21 days.  Will then increase to 20 mg BID.  Patient will need continue therapy for 3-6 months.  # Acute delirium - Unclear etiology.  CT head neg. Likely secondary to steroids and medications (trazodone, valium, increasing intake of pain medications).  - Urine Tox - positive for marijuana metabolite and Benzo's - Delirium now resolved.  # Back/hip/leg pain with reported fecal incontinence - Patient has a history of femoral fracture with non-union and subsequent chronic pain.  Seen by [facility] and [facility].  Had steroid dose pack, hip injection, increased amount of pain medication and diazepam.  - Recent hip CT and xray negative for fracture - MRI shows L4 left nerve root impingement. Patient is under care of  Dr. Franky Macho, who recommends laminectomy.  Dr. Franky Macho to see on Monday.  # Anxiety - Will continue Lexapro  # HTN  - Metoprolol 50 mg BID. - Holding almodipine per cardiology  # HLD - Lipid panel revealed LDL of 117.  - Continue lipitor 40 mg daily.  # Hypokalemia - Potassium 3.0 this am.  Will replete orally with Klor con 40 mEq x 3. - Add on Magnesium to am labs   FEN/GI:  NS @ KVO PPx: Lovenox BID Dispo: Pending clinical improvement Code Status: Full code  Everlene Other, DO 03/20/2012, 6:46 AM

## 2012-03-20 NOTE — Progress Notes (Signed)
Subjective:  Complaints of pain and swelling over dorsum of left foot.No known injury to left foot. No chest pain. Heart rate remains in 90-110/min.   Objective:  Vital Signs in the last 24 hours: Temp:  [97.2 F (36.2 C)-98.1 F (36.7 C)] 97.2 F (36.2 C) (03/16 0730) Pulse Rate:  [31-127] 93 (03/16 0730) Cardiac Rhythm:  [-] Atrial fibrillation (03/15 1949) Resp:  [15-25] 25 (03/16 0730) BP: (107-136)/(68-96) 136/87 mmHg (03/16 0730) SpO2:  [90 %-96 %] 94 % (03/16 0730)  Physical Exam: BP Readings from Last 1 Encounters:  03/20/12 136/87     Wt Readings from Last 1 Encounters:  03/17/12 130.636 kg (288 lb)    Weight change:   HEENT: [place]/AT, Eyes-Hazel, PERL, EOMI, Conjunctiva-Pink, Sclera-Non-icteric Neck: No JVD, No bruit, Trachea midline. Lungs:  Clear, Bilateral. Cardiac:  Regular rhythm, normal S1 and S2, no S3.  Abdomen:  Soft, non-tender. Extremities:  Mild edema and tenderness over left foot is present. No cyanosis. No clubbing. CNS: AxOx3, Cranial nerves grossly intact, moves all 4 extremities. Right handed. Skin: Warm and dry.   Intake/Output from previous day: 03/15 0701 - 03/16 0700 In: 723 [P.O.:720; I.V.:3] Out: 1425 [Urine:1425]    Lab Results: BMET    Component Value Date/Time   NA 140 03/20/2012 0424   K 3.0* 03/20/2012 0424   CL 103 03/20/2012 0424   CO2 25 03/20/2012 0424   GLUCOSE 98 03/20/2012 0424   BUN 11 03/20/2012 0424   CREATININE 0.93 03/20/2012 0424   CALCIUM 9.0 03/20/2012 0424   GFRNONAA 88* 03/20/2012 0424   GFRAA >90 03/20/2012 0424   CBC    Component Value Date/Time   WBC 10.0 03/20/2012 0424   RBC 4.68 03/20/2012 0424   HGB 14.3 03/20/2012 0424   HCT 39.5 03/20/2012 0424   PLT 163 03/20/2012 0424   MCV 84.4 03/20/2012 0424   MCH 30.6 03/20/2012 0424   MCHC 36.2* 03/20/2012 0424   RDW 13.2 03/20/2012 0424   LYMPHSABS 2.1 02/07/2010 2322   MONOABS 0.8 02/07/2010 2322   EOSABS 0.2 02/07/2010 2322   BASOSABS 0.1 02/07/2010 2322   CARDIAC  ENZYMES Lab Results  Component Value Date   TROPONINI <0.30 03/17/2012    Scheduled Meds: . amiodarone  400 mg Oral BID  . aspirin EC  81 mg Oral Daily  . atorvastatin  40 mg Oral q1800  . escitalopram  20 mg Oral Daily  . metoprolol tartrate  50 mg Oral BID  . polyethylene glycol  17 g Oral Daily  . potassium chloride  40 mEq Oral Q2H  . rivaroxaban  15 mg Oral BID WC  . sodium chloride  3 mL Intravenous Q12H   Continuous Infusions: . sodium chloride 20 mL/hr (03/18/12 1115)   PRN Meds:.LORazepam, metoprolol  Assessment/Plan: New-onset A. fib with RVR with recently being bedbound rule out PE  Hypertension  Anxiety disorder  Chronic pain syndrome  Status post delirium secondary to meds. Left foot pain  Offered TEE with cardioversion tomorrow. Patient to discuss with wife and let me know X-ray left foot   LOS: 4 days    Orpah Cobb  MD  03/20/2012, 9:26 AM

## 2012-03-20 NOTE — Progress Notes (Signed)
Spoke with Brandon [name] and his wife in detail regarding cardioversion during admission. Patient unclear if he wants procedure at this time.  Became tearful during conversation.   Will make patient NPO after midnight.  Patient to continue discussion with cardiology in the am regarding risks/benefits of procedure and hopefully will decide in the am if he will proceed with cardioversion or not.

## 2012-03-21 ENCOUNTER — Encounter (HOSPITAL_COMMUNITY)
Admission: EM | Disposition: A | Discharge status: Home / Self Care | Payer: Self-pay | Source: Home / Self Care | Attending: Family Medicine

## 2012-03-21 ENCOUNTER — Inpatient Hospital Stay (HOSPITAL_COMMUNITY): Payer: Federal, State, Local not specified - PPO | Admitting: Anesthesiology

## 2012-03-21 ENCOUNTER — Encounter (HOSPITAL_COMMUNITY): Payer: Self-pay | Admitting: *Deleted

## 2012-03-21 ENCOUNTER — Encounter (HOSPITAL_COMMUNITY): Payer: Self-pay | Admitting: Anesthesiology

## 2012-03-21 DIAGNOSIS — M5442 Lumbago with sciatica, left side: Secondary | ICD-10-CM

## 2012-03-21 DIAGNOSIS — S99922A Unspecified injury of left foot, initial encounter: Secondary | ICD-10-CM

## 2012-03-21 DIAGNOSIS — S6991XA Unspecified injury of right wrist, hand and finger(s), initial encounter: Secondary | ICD-10-CM

## 2012-03-21 DIAGNOSIS — I2699 Other pulmonary embolism without acute cor pulmonale: Secondary | ICD-10-CM

## 2012-03-21 DIAGNOSIS — M25559 Pain in unspecified hip: Secondary | ICD-10-CM

## 2012-03-21 DIAGNOSIS — E785 Hyperlipidemia, unspecified: Secondary | ICD-10-CM

## 2012-03-21 DIAGNOSIS — M5440 Lumbago with sciatica, unspecified side: Secondary | ICD-10-CM

## 2012-03-21 DIAGNOSIS — G8929 Other chronic pain: Secondary | ICD-10-CM

## 2012-03-21 HISTORY — PX: TEE WITHOUT CARDIOVERSION: SHX5443

## 2012-03-21 HISTORY — DX: Lumbago with sciatica, left side: M54.42

## 2012-03-21 HISTORY — PX: CARDIOVERSION: SHX1299

## 2012-03-21 LAB — CBC
HCT: 39.8 % (ref 39.0–52.0)
Hemoglobin: 14.2 g/dL (ref 13.0–17.0)
MCH: 30.1 pg (ref 26.0–34.0)
MCHC: 35.7 g/dL (ref 30.0–36.0)
MCV: 84.5 fL (ref 78.0–100.0)
Platelets: 165 10*3/uL (ref 150–400)
RBC: 4.71 MIL/uL (ref 4.22–5.81)
RDW: 13.3 % (ref 11.5–15.5)
WBC: 9.8 10*3/uL (ref 4.0–10.5)

## 2012-03-21 LAB — BASIC METABOLIC PANEL
BUN: 13 mg/dL (ref 6–23)
CO2: 28 mEq/L (ref 19–32)
Calcium: 9 mg/dL (ref 8.4–10.5)
Chloride: 105 mEq/L (ref 96–112)
Creatinine, Ser: 1.05 mg/dL (ref 0.50–1.35)
GFR calc Af Amer: 85 mL/min — ABNORMAL LOW (ref >90)
GFR calc non Af Amer: 74 mL/min — ABNORMAL LOW (ref >90)
Glucose, Bld: 96 mg/dL (ref 70–99)
Potassium: 3.8 mEq/L (ref 3.5–5.1)
Sodium: 140 mEq/L (ref 135–145)

## 2012-03-21 LAB — BENZODIAZEPINE, QUANTITATIVE, URINE
Alprazolam (GC/LC/MS), ur confirm: NEGATIVE ng/mL
Alprazolam metabolite (GC/LC/MS), ur confirm: NEGATIVE ng/mL
Clonazepam metabolite (GC/LC/MS), ur confirm: NEGATIVE ng/mL
Diazepam (GC/LC/MS), ur confirm: NEGATIVE ng/mL
Estazolam (GC/LC/MS), ur confirm: NEGATIVE ng/mL
Flunitrazepam metabolite (GC/LC/MS), ur confirm: NEGATIVE ng/mL
Flurazepam GC/MS Conf: NEGATIVE ng/mL
Lorazepam (GC/LC/MS), ur confirm: 412 ng/mL
Midazolam (GC/LC/MS), ur confirm: NEGATIVE ng/mL
Nordiazepam GC/MS Conf: 673 ng/mL
Oxazepam GC/MS Conf: 2749 ng/mL
Temazepam GC/MS Conf: 3117 ng/mL
Triazolam metabolite (GC/LC/MS), ur confirm: NEGATIVE ng/mL

## 2012-03-21 LAB — THC (MARIJUANA), URINE, CONFIRMATION: Marijuana, Ur-Confirmation: 278 ng/mL

## 2012-03-21 SURGERY — ECHOCARDIOGRAM, TRANSESOPHAGEAL
Anesthesia: Moderate Sedation

## 2012-03-21 SURGERY — ECHOCARDIOGRAM, TRANSESOPHAGEAL
Anesthesia: General

## 2012-03-21 MED ORDER — PROPOFOL 10 MG/ML IV BOLUS
INTRAVENOUS | Status: DC | PRN
  Administered 2012-03-21: 130 mg via INTRAVENOUS

## 2012-03-21 MED ORDER — MAGNESIUM SULFATE 40 MG/ML IJ SOLN
2.0000 g | Freq: Once | INTRAMUSCULAR | Status: DC
  Filled 2012-03-21: qty 50

## 2012-03-21 MED ORDER — FENTANYL CITRATE 0.05 MG/ML IJ SOLN
INTRAMUSCULAR | Status: DC | PRN
  Administered 2012-03-21 (×3): 25 ug via INTRAVENOUS

## 2012-03-21 MED ORDER — MIDAZOLAM HCL 5 MG/5ML IJ SOLN
INTRAMUSCULAR | Status: DC | PRN
  Administered 2012-03-21 (×2): 2 mg via INTRAVENOUS
  Administered 2012-03-21: 1 mg via INTRAVENOUS

## 2012-03-21 MED ORDER — LIDOCAINE HCL (CARDIAC) 20 MG/ML IV SOLN
INTRAVENOUS | Status: DC | PRN
  Administered 2012-03-21: 80 mg via INTRAVENOUS

## 2012-03-21 MED ORDER — METOPROLOL TARTRATE 1 MG/ML IV SOLN
INTRAVENOUS | Status: AC
  Filled 2012-03-21: qty 5

## 2012-03-21 MED ORDER — DIGOXIN 0.25 MG/ML IJ SOLN
0.2500 mg | Freq: Once | INTRAMUSCULAR | Status: DC
  Filled 2012-03-21: qty 1

## 2012-03-21 MED ORDER — SODIUM CHLORIDE 0.9 % IV SOLN
INTRAVENOUS | Status: DC
  Administered 2012-03-21: 500 mL via INTRAVENOUS

## 2012-03-21 MED ORDER — FENTANYL CITRATE 0.05 MG/ML IJ SOLN
INTRAMUSCULAR | Status: AC
  Filled 2012-03-21: qty 4

## 2012-03-21 MED ORDER — MIDAZOLAM HCL 5 MG/ML IJ SOLN
INTRAMUSCULAR | Status: AC
  Filled 2012-03-21: qty 2

## 2012-03-21 MED ORDER — BUTAMBEN-TETRACAINE-BENZOCAINE 2-2-14 % EX AERO
INHALATION_SPRAY | CUTANEOUS | Status: DC | PRN
  Administered 2012-03-21: 2 via TOPICAL

## 2012-03-21 MED ORDER — POTASSIUM CHLORIDE CRYS ER 20 MEQ PO TBCR
40.0000 meq | EXTENDED_RELEASE_TABLET | Freq: Once | ORAL | Status: AC
  Administered 2012-03-21: 40 meq via ORAL
  Filled 2012-03-21: qty 1

## 2012-03-21 NOTE — Progress Notes (Signed)
Subjective:  Patient seen in endoscopy department as being cardioverted as per her Dr. Algie Coffer. Sedated  Objective:  Vital Signs in the last 24 hours: Temp:  [97.4 F (36.3 C)-98.6 F (37 C)] 98.6 F (37 C) (03/17 1037) Pulse Rate:  [76-128] 125 (03/17 1037) Resp:  [9-21] 9 (03/17 1155) BP: (107-174)/(54-124) 150/80 mmHg (03/17 1155) SpO2:  [90 %-98 %] 97 % (03/17 1155) Weight:  [130.636 kg (288 lb)] 130.636 kg (288 lb) (03/17 1037)  Intake/Output from previous day: 03/16 0701 - 03/17 0700 In: 480 [P.O.:480] Out: 775 [Urine:775] Intake/Output from this shift: Total I/O In: -  Out: 550 [Urine:550]  Physical Exam: Unchanged  Lab Results:  Recent Labs  03/20/12 0424 03/21/12 0440  WBC 10.0 9.8  HGB 14.3 14.2  PLT 163 165    Recent Labs  03/20/12 0424 03/21/12 0730  NA 140 140  K 3.0* 3.8  CL 103 105  CO2 25 28  GLUCOSE 98 96  BUN 11 13  CREATININE 0.93 1.05   No results found for this basename: TROPONINI, CK, MB,  in the last 72 hours Hepatic Function Panel No results found for this basename: PROT, ALBUMIN, AST, ALT, ALKPHOS, BILITOT, BILIDIR, IBILI,  in the last 72 hours No results found for this basename: CHOL,  in the last 72 hours No results found for this basename: PROTIME,  in the last 72 hours  Imaging: Imaging results have been reviewed and Dg Wrist 2 Views Right  03/19/2012  *RADIOLOGY REPORT*  Clinical Data: Injury with pain, swelling and redness.  RIGHT WRIST - 2 VIEW  Comparison: None.  Findings: No evidence of fracture, dislocation, degenerative change or other focal lesion.  IV tubing along the dorsum of the wrist.  IMPRESSION: No evident osseous or articular pathology.   Original Report Authenticated By: Paulina Fusi, M.D.    Dg Foot 2 Views Left  03/20/2012  *RADIOLOGY REPORT*  Clinical Data: Pain and swelling  LEFT FOOT - 2 VIEW  Comparison: None.  Findings: No acute fracture and no dislocation.  Moderate degenerative changes of the first  metatarsophalangeal joint. Degenerative changes in the ankle and midfoot are noted.  Minimal spur at the inferior calcaneus.  Bone mineralization is within normal limits.  No destructive bone lesion.  IMPRESSION: Degenerative changes.   Original Report Authenticated By: Jolaine Click, M.D.     Cardiac Studies:  Assessment/Plan:  Bilateral pulmonary embolism  New-onset A. fib with RVR secondary to above  Hypertension  Hypokalemia  Chronic pain syndrome  Anxiety disorder  Status post delirium secondary to meds Plan Continue present management  LOS: 5 days    Odell Choung N 03/21/2012, 12:05 PM

## 2012-03-21 NOTE — Transfer of Care (Signed)
Immediate Anesthesia Transfer of Care Note  Patient: Brandon [name]  Procedure(s) Performed: Procedure(s): TRANSESOPHAGEAL ECHOCARDIOGRAM (TEE) (N/A) CARDIOVERSION (N/A)  Patient Location: PACU and Endoscopy Unit  Anesthesia Type:General  Level of Consciousness: sedated and patient cooperative  Airway & Oxygen Therapy: Patient Spontanous Breathing and Patient connected to nasal cannula oxygen  Post-op Assessment: Report given to PACU RN and Post -op Vital signs reviewed and stable  Post vital signs: Reviewed and stable  Complications: No apparent anesthesia complications

## 2012-03-21 NOTE — Progress Notes (Signed)
FMTS Attending Daily Note: Brandon Trueheart MD [phone] pager office [phone] I  have seen and examined this patient, reviewed their chart. I have discussed this patient with the resident. I agree with the resident's findings, assessment and care plan. 

## 2012-03-21 NOTE — Progress Notes (Signed)
Family Medicine Teaching Service Daily Progress Note Service Page: [phone]  Subjective:  Resting comfortably in bed this am. Per wife, patient is okay with cardioversion.  Objective: Temp:  [97.4 F (36.3 C)-98.9 F (37.2 C)] 98.4 F (36.9 C) (03/17 0815) Pulse Rate:  [66-128] 85 (03/17 0815) Resp:  [15-21] 18 (03/17 0815) BP: (107-145)/(54-86) 128/54 mmHg (03/17 0815) SpO2:  [90 %-94 %] 93 % (03/17 0815)   Intake/Output Summary (Last 24 hours) at 03/21/12 0911 Last data filed at 03/21/12 0832  Gross per 24 hour  Intake    240 ml  Output   1050 ml  Net   -810 ml   Exam: General: resting comfortably in bed, NAD.  Cardiovascular: Irregularly, irregular. No murmurs, rubs, or gallops. Respiratory: CTAB. No rales, rhonchi, or wheeze. Abdomen: obese, soft, nontender, nondistended. Extremities: right medial wrist with erythema and swelling over ulnar styloid. No bruising appreciated.  No LE edema appreciated. Neuro: AO x 3. No focal deficits on exam.  CBC BMET   Recent Labs Lab 03/19/12 0505 03/20/12 0424 03/21/12 0440  WBC 8.5 10.0 9.8  HGB 13.7 14.3 14.2  HCT 38.9* 39.5 39.8  PLT 146* 163 165    Recent Labs Lab 03/19/12 0919 03/20/12 0424 03/21/12 0730  NA 140 140 140  K 3.8 3.0* 3.8  CL 105 103 105  CO2 25 25 28   BUN 9 11 13   CREATININE 0.90 0.93 1.05  GLUCOSE 114* 98 96  CALCIUM 9.1 9.0 9.0     BNP    Component Value Date/Time   PROBNP 4419.0* 03/16/2012 1630   Cardiac Panel (last 3 results) No results found for this basename: CKTOTAL, CKMB, TROPONINI, RELINDX,  in the last 72 hours Lipid Panel     Component Value Date/Time   CHOL 170 03/17/2012 0432   TRIG 117 03/17/2012 0432   HDL 30* 03/17/2012 0432   CHOLHDL 5.7 03/17/2012 0432   VLDL 23 03/17/2012 0432   LDLCALC 117* 03/17/2012 0432   Imaging/Diagnostic Tests:  Ct Angio Chest Pe W/cm &/or Wo Cm 03/17/2012   IMPRESSION:  1.  Bilateral pulmonary emboli with the most proximal clot seen at the  segmental level on the left.  No evidence of right heart strain. These results will be called to the ordering clinician or representative by the Radiologist Assistant, and communication documented in the PACS Dashboard. 2.  Right thyroid nodule. Consider further evaluation with thyroid ultrasound.  If patient is clinically hyperthyroid, consider nuclear medicine thyroid uptake and scan.     Dg Hip Complete Left 02/21/2012   IMPRESSION: No acute abnormality.  Right greater than left hip osteoarthritis. Proximal left femoral shaft malunion.      Ct Head Wo Contrast 03/17/2012  IMPRESSION: No acute intracranial abnormalities.    Ct Hip Left Wo Contrast 02/21/2012  IMPRESSION: Proximal left femoral shaft deformity keeping with sequelae of prior fracture.  No displaced acute fracture is identified.  MRI has increased sensitivity for stress fracture/nondisplaced fracture if clinical concern persists.     Dg Chest Port 1 View 03/16/2012  IMPRESSION: Cardiomegaly, without acute disease.  Multifactorial degradation.  2D Echo: Study Conclusions - Left ventricle: The cavity size was normal. There was moderate concentric hypertrophy. Systolic function was vigorous. The estimated ejection fraction was in the range of 65% to 70%. Wall motion was normal; there were no regional wall motion abnormalities. - Aortic root: The aortic root was moderately dilated. - Left atrium: The atrium was moderately to severely dilated. - Right  atrium: The atrium was mildly dilated.  MRI Lumbar Spine New left L4-L5 left foraminal disc herniation, likely compressing L4 nerve root and could explain symptoms.   Scheduled Meds: . amiodarone  400 mg Oral BID  . aspirin EC  81 mg Oral Daily  . atorvastatin  40 mg Oral q1800  . escitalopram  20 mg Oral Daily  . metoprolol tartrate  50 mg Oral BID  . polyethylene glycol  17 g Oral Daily  . rivaroxaban  15 mg Oral BID WC  . sodium chloride  3 mL Intravenous Q12H    Assessment/Plan: 62 y.o. year old male with PMH of HTN who presented with approximately 1 week of worsening confusion, difficulty walking due to left back/hip/thigh pain and was reporting new onset of dizziness with subsequent findings of tachycardia and hypotension by his daughter who was a paramedic.  In the ED, patient found to be in Atrial fibrillation with RVR.  # Atrial fibrillation with RVR - Likely secondary to PE (see below).  TSH normal at 2.247.  - Patient was placed on Diltiazem gtt intially.  Subsequently started on Amiodarone for rate control. - Cardiology following and we greatly appreciate their help in managing this patient. - Continue therapy per cardiology - Metoprolol 50 BID, Amiodarone 400 mg BID.  - Patient given option of TEE/Cardioversion yesterday (3/16).  Going for cardioversion today.  # Pulmonary Embolus - bilateral - D dimer was obtained and was elevated at 1.78. - CTA was obtained on 3/13 and revealed bilateral pulmonary emboli. - Rivaroxaban 15 mg BID started on 3/14, cont for 21 days.  Will then increase to 20 mg daily.  Patient will need continue therapy for at least 3-6 months.  # Acute delirium - CT head neg. Likely secondary to steroids and medications (trazodone, valium, increasing intake of pain medications).  - Urine Tox - positive for marijuana metabolite and Benzo's - Delirium now resolved after cessation of narcotics/trazadone/benzodiazepine  # Back/hip/leg pain with reported fecal incontinence - Patient has a history of femoral fracture with non-union and subsequent chronic pain.  Seen by [facility] and [facility].  Had steroid dose pack, hip injection, increased amount of pain medication and diazepam.  - Recent hip CT and xray negative for fracture - MRI shows L4 left nerve root impingement. Patient is under care of  Dr. Franky Macho, who recommends laminectomy.  Dr. Franky Macho to see on Monday.  # Anxiety - Will continue Lexapro  #  HTN  - Metoprolol 50 mg BID. - Holding almodipine per cardiology  # HLD - Lipid panel revealed LDL of 117.  - Continue lipitor 40 mg daily.  # Hypokalemia - Awaiting BMP this am.  FEN/GI: SL IV. NPO currently. PPx: Xarelto Dispo: Pending clinical improvement Code Status: Full code  Everlene Other, DO 03/21/2012, 9:11 AM

## 2012-03-21 NOTE — Preoperative (Addendum)
Beta Blockers   Tenoretic taken a "few days ago" Pt will be given Lopressor 5 mg IV  Given by Endo RN (see her chart)

## 2012-03-21 NOTE — Progress Notes (Signed)
Subjective:  Ready for TEE and cardioversion. Foot x-ray negative for acute fracture, + ve for chronic arthritis.  Objective:  Vital Signs in the last 24 hours: Temp:  [97.4 F (36.3 C)-98.9 F (37.2 C)] 98.4 F (36.9 C) (03/17 0815) Pulse Rate:  [66-128] 85 (03/17 0815) Cardiac Rhythm:  [-] Atrial fibrillation (03/17 0815) Resp:  [15-21] 18 (03/17 0815) BP: (107-145)/(54-86) 128/54 mmHg (03/17 0815) SpO2:  [90 %-94 %] 93 % (03/17 0815)  Physical Exam: BP Readings from Last 1 Encounters:  03/21/12 128/54     Wt Readings from Last 1 Encounters:  03/17/12 130.636 kg (288 lb)    Weight change:   HEENT: [place]/AT, Eyes-Hazel., PERL, EOMI, Conjunctiva-Pink, Sclera-Non-icteric Neck: No JVD, No bruit, Trachea midline. Lungs:  Clear, Bilateral. Cardiac:  Regular rhythm, normal S1 and S2, no S3.  Abdomen:  Soft, non-tender. Extremities:  No edema present. No cyanosis. No clubbing. CNS: AxOx3, Cranial nerves grossly intact, moves all 4 extremities. Right handed. Skin: Warm and dry.   Intake/Output from previous day: 03/16 0701 - 03/17 0700 In: 480 [P.O.:480] Out: 775 [Urine:775]    Lab Results: BMET    Component Value Date/Time   NA 140 03/21/2012 0730   K 3.8 03/21/2012 0730   CL 105 03/21/2012 0730   CO2 28 03/21/2012 0730   GLUCOSE 96 03/21/2012 0730   BUN 13 03/21/2012 0730   CREATININE 1.05 03/21/2012 0730   CALCIUM 9.0 03/21/2012 0730   GFRNONAA 74* 03/21/2012 0730   GFRAA 85* 03/21/2012 0730   CBC    Component Value Date/Time   WBC 9.8 03/21/2012 0440   RBC 4.71 03/21/2012 0440   HGB 14.2 03/21/2012 0440   HCT 39.8 03/21/2012 0440   PLT 165 03/21/2012 0440   MCV 84.5 03/21/2012 0440   MCH 30.1 03/21/2012 0440   MCHC 35.7 03/21/2012 0440   RDW 13.3 03/21/2012 0440   LYMPHSABS 2.1 02/07/2010 2322   MONOABS 0.8 02/07/2010 2322   EOSABS 0.2 02/07/2010 2322   BASOSABS 0.1 02/07/2010 2322   CARDIAC ENZYMES Lab Results  Component Value Date   TROPONINI <0.30 03/17/2012    Scheduled  Meds: . amiodarone  400 mg Oral BID  . aspirin EC  81 mg Oral Daily  . atorvastatin  40 mg Oral q1800  . escitalopram  20 mg Oral Daily  . metoprolol tartrate  50 mg Oral BID  . polyethylene glycol  17 g Oral Daily  . rivaroxaban  15 mg Oral BID WC  . sodium chloride  3 mL Intravenous Q12H   Continuous Infusions: . sodium chloride 20 mL/hr (03/18/12 1115)   PRN Meds:.LORazepam, metoprolol  Assessment/Plan: New-onset A. fib with RVR with recently being bedbound rule out PE  Hypertension  Anxiety disorder  Chronic pain syndrome  Status post delirium secondary to meds.  Left foot pain  TEE with cardioversion today.   LOS: 5 days    Orpah Cobb  MD  03/21/2012, 9:56 AM

## 2012-03-21 NOTE — Anesthesia Preprocedure Evaluation (Addendum)
Anesthesia Evaluation  Patient identified by MRN, date of birth, ID band Patient awake    Reviewed: Allergy & Precautions, H&P , NPO status , Patient's Chart, lab work & pertinent test results  Airway Mallampati: III TM Distance: >3 FB Neck ROM: Full    Dental  (+) Teeth Intact and Dental Advisory Given   Pulmonary asthma , sleep apnea and Continuous Positive Airway Pressure Ventilation ,    Pulmonary exam normal       Cardiovascular Exercise Tolerance: Poor hypertension, Pt. on medications and Pt. on home beta blockers + dysrhythmias Atrial Fibrillation Rhythm:Irregular Rate:Tachycardia  --03-17-12 ECHO---------------------------------------------------------- Left ventricle:  The cavity size was normal. There was moderate concentric hypertrophy. Systolic function was vigorous. The estimated ejection fraction was in the range of 65% to 70%. Wall motion was normal; there were no regional wall motion abnormalities.     Neuro/Psych    GI/Hepatic Neg liver ROS, GERD-  Medicated,  Endo/Other  negative endocrine ROS  Renal/GU negative Renal ROS     Musculoskeletal  (+) Arthritis -, Osteoarthritis,    Abdominal Normal abdominal exam  (+)   Peds  Hematology negative hematology ROS (+)   Anesthesia Other Findings   Reproductive/Obstetrics                        Anesthesia Physical Anesthesia Plan  ASA: III  Anesthesia Plan: General   Post-op Pain Management:    Induction: Intravenous  Airway Management Planned: Mask  Additional Equipment:   Intra-op Plan:   Post-operative Plan:   Informed Consent: I have reviewed the patients History and Physical, chart, labs and discussed the procedure including the risks, benefits and alternatives for the proposed anesthesia with the patient or authorized representative who has indicated his/her understanding and acceptance.   Dental advisory  given  Plan Discussed with: CRNA  Anesthesia Plan Comments:         Anesthesia Quick Evaluation

## 2012-03-21 NOTE — Progress Notes (Signed)
  Echocardiogram Echocardiogram Transesophageal has been performed.  Brandon [name] 03/21/2012, 12:00 PM

## 2012-03-21 NOTE — Progress Notes (Signed)
Patient now in sinus rhythm with HR in 60's. Metoprolol and amiodarone discontinued.

## 2012-03-21 NOTE — Anesthesia Postprocedure Evaluation (Signed)
  Anesthesia Post-op Note  Patient: Brandon [name]  Procedure(s) Performed: Procedure(s): TRANSESOPHAGEAL ECHOCARDIOGRAM (TEE) (N/A) CARDIOVERSION (N/A)  Patient Location: PACU and Endoscopy Unit  Anesthesia Type:General  Level of Consciousness: awake, alert , oriented and patient cooperative  Airway and Oxygen Therapy: Patient Spontanous Breathing and Patient connected to nasal cannula oxygen  Post-op Pain: none  Post-op Assessment: Post-op Vital signs reviewed and Patient's Cardiovascular Status Stable  Post-op Vital Signs: Reviewed and stable  Complications: No apparent anesthesia complications

## 2012-03-21 NOTE — Discharge Summary (Signed)
Family Medicine Teaching [facility] Discharge Summary  Patient name: Brandon [name] Medical record number: [id number] Date of birth: 11/07/1950 Age: 62 y.o. Gender: male Date of Admission: 03/16/2012  Date of Discharge: 03/22/12 Admitting Physician: Barbaraann Barthel, MD  Primary Care Provider: Almedia Balls, MD  Indication for Hospitalization: Confusion, Atrial fibrillation with RVR  Discharge Diagnoses:  Principal Problem:   Atrial fibrillation with RVR Active Problems:   ANXIETY   Acute delirium   Pulmonary embolism   DVT (deep venous thrombosis)   Right wrist injury   Injury of foot, left   Hip pain   Back pain   Other and unspecified hyperlipidemia  Brief Hospital Course:  62 y.o. year old male with PMH of HTN who presented with approximately 1 week of worsening confusion, difficulty walking due to left back/hip/thigh pain and was reporting new onset of dizziness with subsequent findings of tachycardia and hypotension by his daughter who was a paramedic. In the ED, patient found to have new onset atrial fibrillation with RVR.  1) Atrial fibrillation with RVR - Patient started on Diltiazem drip.  Rate remained elevated to the 140's. - Cardiology was then consulted.  Patient subsequently started on Amiodarone and Diltiazem was discontinued. - HR continued to be elevated and Metoprolol 50 mg BID was then added. - Despite above therapy, patient remained in atrial fibrillation with RVR.  Patient then underwent successful DC cardioversion and converted to sinus rhythm.  2) Pulmonary embolus - bilateral - Patient noted to have decreased mobility prior to admission secondary to pain. - Given acute presentation of atrial fibrillation and decreased mobility, there was concern for PE as inciting event. - Lower extremity dopplers were obtained and revealed DVT of right peroneal vein. - D-dimer was obtained and was elevated at 1.78.  CTA revealed bilateral pulmonary emboli (see Labs and Imaging  section below) - Patient was anticoagulated with Heparin initially then was transitioned to Xarelto.  3) Acute delirium - Patient noted to be confused on admission. - Per family, patient had recently finished Prednisone burst and had been increasing intake of diazepam and pain medications (see below). - CT head was obtained and was negative. - Delirium resolved following cessation of narcotics and benzodiazepine.    4) Left hip pain and Back pain - Patient has a longstanding history of hip pain and back pain.  Patient has a history of femoral fracture with non-union.  He is followed by [facility] and by [facility] (Dr. Franky Macho). - Patient had been increasing intake of Diazepam and Percocet secondary to pain. - Patient also reported fecal incontinence on admission. - Physical exam revealed no evidence of saddle anesthesia and patient had good rectal tone. - Fecal incontinence resolved following resolution of acute delirium. - Patient did not require any pain medication during admission.  MRI of hip, lumbar spine, and femur were obtained during admission, as Dr. Franky Macho had planned on these studies as an outpatient.  MRI of lumbar spine revealed disc herniation at L4-L5 with nerve impingement.  Patient will be followed by Dr. Franky Macho and is a candidate for laminectomy. - Patient was not discharged on home Percocet and Diazepam as he did not require them during admission and came in with acute delirium. This should be readdressed at follow up.  5) Anxiety - Continued home Lexapro  6) HTN - Managed via rate control for atrial fibrillation - Home medications held during hospitalization  7) Hyperlipidemia - LDL was found to be elevated at 117. - Patient  started on Lipitor 40 mg  8) Right wrist injury and Left foot injury - While patient was been transferred for MRI, patient suffered injury to wrist and foot.   Skip Mayer were obtained and were negative (see  below).  Significant Labs and Imaging:   CBC BMET   Recent Labs Lab 03/20/12 0424 03/21/12 0440 03/22/12 0315  WBC 10.0 9.8 7.8  HGB 14.3 14.2 12.7*  HCT 39.5 39.8 35.6*  PLT 163 165 153    Recent Labs Lab 03/20/12 0424 03/21/12 0730 03/22/12 0753  NA 140 140 140  K 3.0* 3.8 3.9  CL 103 105 104  CO2 25 28 28   BUN 11 13 15   CREATININE 0.93 1.05 1.00  GLUCOSE 98 96 88  CALCIUM 9.0 9.0 9.3     Lipid Panel     Component Value Date/Time   CHOL 170 03/17/2012 0432   TRIG 117 03/17/2012 0432   HDL 30* 03/17/2012 0432   CHOLHDL 5.7 03/17/2012 0432   VLDL 23 03/17/2012 0432   LDLCALC 117* 03/17/2012 0432   Dg Wrist 2 Views Right 03/19/2012   IMPRESSION: No evident osseous or articular pathology.   Ct Head Wo Contrast 03/17/2012  IMPRESSION: No acute intracranial abnormalities.     Ct Angio Chest Pe W/cm &/or Wo Cm 03/17/2012  IMPRESSION:  1.  Bilateral pulmonary emboli with the most proximal clot seen at the segmental level on the left.  No evidence of right heart strain. These results will be called to the ordering clinician or representative by the Radiologist Assistant, and communication documented in the PACS Dashboard. 2.  Right thyroid nodule. Consider further evaluation with thyroid ultrasound.  If patient is clinically hyperthyroid, consider nuclear medicine thyroid uptake and scan.  Mr Lumbar Spine Wo Contrast 03/18/2012 IMPRESSION: New left foraminal disc herniation at L4-5 likely to compress the left L4 nerve root.  This probably explains the patient's symptoms.  Slight increase in size of shallow disc protrusions at L2-3 and L3- 4, more prominent on the right.  There is some narrowing of the lateral recesses and foramina, particularly on the right at those levels.  Mr Femur Left Wo Contrast 03/19/2012 IMPRESSION: No acute abnormality of the left thigh.  Old healed fracture of the proximal left femur.    Mr Hip Left Wo Contrast 03/19/2012 IMPRESSION: No acute  abnormality of the left hip.  Minimal degenerative changes of the left hip joint, symmetrical with the opposite side.    Dg Chest Port 1 View 03/16/2012  IMPRESSION: Cardiomegaly, without acute disease.  Multifactorial degradation.    Dg Foot 2 Views Left 03/20/2012 IMPRESSION: Degenerative changes.  2D Echo:  Study Conclusions - Left ventricle: The cavity size was normal. There was moderate concentric hypertrophy. Systolic function was vigorous. The estimated ejection fraction was in the range of 65% to 70%. Wall motion was normal; there were no regional wall motion abnormalities. - Aortic root: The aortic root was moderately dilated. - Left atrium: The atrium was moderately to severely dilated. - Right atrium: The atrium was mildly dilated.  TEE: IMPRESSION:  1. Mils LVH.  2. Moderate MR.  3. Mild TR.  4. No PFO.  5. No clot in LA or LA appendage or LV.  Procedures: TEE/DC Cardioversion  Consultations: Cardiology, Dr. Sharyn Lull and Dr. Algie Coffer; Dr. Franky Macho, Neurosurgery.  Discharge Medications:    Medication List    STOP taking these medications       atenolol-chlorthalidone 100-25 MG per tablet  Commonly known as:  TENORETIC     diazepam 10 MG tablet  Commonly known as:  VALIUM     Ibuprofen-Diphenhydramine HCl 200-25 MG Caps     naproxen 500 MG tablet  Commonly known as:  NAPROSYN     oxyCODONE-acetaminophen 10-325 MG per tablet  Commonly known as:  PERCOCET     oxyCODONE-acetaminophen 5-325 MG per tablet  Commonly known as:  PERCOCET/ROXICET     potassium chloride SA 20 MEQ tablet  Commonly known as:  K-DUR,KLOR-CON      TAKE these medications       amiodarone 200 MG tablet  Commonly known as:  PACERONE  Take 1 tablet (200 mg total) by mouth daily.  Start taking on:  03/23/2012     amLODipine 5 MG tablet  Commonly known as:  NORVASC  Take 5 mg by mouth daily.     aspirin 81 MG EC tablet  Take 1 tablet (81 mg total) by mouth daily.     atorvastatin  40 MG tablet  Commonly known as:  LIPITOR  Take 1 tablet (40 mg total) by mouth daily at 6 PM.     escitalopram 20 MG tablet  Commonly known as:  LEXAPRO  Take 20 mg by mouth daily.     ibuprofen 200 MG tablet  Commonly known as:  ADVIL,MOTRIN  Take 800 mg by mouth every 6 (six) hours as needed for pain.     Rivaroxaban 15 MG Tabs tablet  Commonly known as:  XARELTO  Take 1 tablet (15 mg total) by mouth 2 (two) times daily with a meal.     Rivaroxaban 20 MG Tabs  Commonly known as:  XARELTO  Take 1 tablet (20 mg total) by mouth daily. Take with food.  Start taking on:  04/08/2012     traZODone 50 MG tablet  Commonly known as:  DESYREL  Take 50 mg by mouth at bedtime as needed for sleep.       Issues for Follow Up:  1) Recommend BMP to assess for hypokalemia 2) Ensure followup with cardiology 3) Patient needs to continue Xarelto for 3-6 months (would recommend 6 months treatment due to pulmonary embolus). 4) Patient was not discharged on home Diazepam and Oxycodone.  This should be readdressed at follow up. 5) HTN - patient may need addition of beta blocker and further titration/addition of medications.  Outstanding Results: None  Discharge Instructions: Patient was counseled important signs and symptoms that should prompt return to medical care, changes in medications, dietary instructions, activity restrictions, and follow up appointments.    Follow-up Information   Schedule an appointment as soon as possible for a visit with Almedia Balls, MD.   Contact information:   [phone] [place] [address] [phone]       Schedule an appointment as soon as possible for a visit with CABBELL,KYLE L, MD.   Contact information:   [address]                         [address] [place] [address] [phone]       Schedule an appointment as soon as possible for a visit with Robynn Pane, MD.   Contact information:   [address]  [place] [address] [phone]       Discharge Condition: Stable. Discharged home.  Everlene Other, DO 03/22/2012, 10:46 AM

## 2012-03-21 NOTE — CV Procedure (Signed)
PRE-OP DIAGNOSIS:  Atrial fibrillation.  POST-OP DIAGNOSIS:  Sinus rhythm.  OPERATOR:  Orpah Cobb, MD.     ANESTHESIA:  120 mg. propafol.  COMPLICATIONS:  None.   OPERATIVE TERM:  DC cardioversion.  The nature of the procedure, risks and alternatives were discussed with the patient who gave informed consent.  OPERATIVE TECHNIQUE:  The patient was sedated with propafol.  When the patient was no longer responsive to quiet voice, DC cardioversion was performed with 120. 200 and 200 J biphasically and synchronously.  Converted to sinus rhythm.  The patient was then monitored until fully alert and left the procedure area in stable condition.  IMPRESSION:  Successful DC cardioversion.

## 2012-03-21 NOTE — CV Procedure (Signed)
INDICATIONS:   The patient is 62 years old with new onst of atrial fibrillation.  PROCEDURE:  Informed consent was discussed including risks, benefits and alternatives for the procedure.  Risks include, but are not limited to, cough, sore throat, vomiting, nausea, somnolence, esophageal and stomach trauma or perforation, bleeding, low blood pressure, aspiration, pneumonia, infection, trauma to the teeth and death.    Patient was given sedation.  The oropharynx was anesthetized with topical lidocaine.  The transesophageal probe was inserted in the esophagus and stomach and multiple views were obtained.  Agitated saline was used after the transesophageal probe was removed from the body.  The patient was kept under observation until the patient left the procedure room.  The patient left the procedure room in stable condition.   COMPLICATIONS:  There were no immediate complications.  FINDINGS:  1. LEFT VENTRICLE: The left ventricle is mildly hypertrophic and normal in function.  Wall motion is normal.  No thrombus or masses seen in the left ventricle.  2. RIGHT VENTRICLE:  The right ventricle is normal in structure and function without any thrombus or masses.    3. LEFT ATRIUM:  The left atrium is dilated without any thrombus or masses. + "smoke".  4. LEFT ATRIAL APPENDAGE:  The left atrial appendage is free of any thrombus or masses. Low flow velocity.  5. RIGHT ATRIUM:  The right atrium is free of any thrombus or masses.    6. ATRIAL SEPTUM:  The atrial septum is normal without any ASD or PFO. Negative bubble study.  7. MITRAL VALVE:  The mitral valve is normal in structure and function with multiple jets of moderate regurgitation. No masses, stenosis or vegetations.  8. TRICUSPID VALVE:  The tricuspid valve is normal in structure and function with mild regurgitation. No masses, stenosis or vegetations.  9. AORTIC VALVE:  The aortic valve is normal in structure and function without  regurgitation, masses, stenosis or vegetations.   10. PULMONIC VALVE:  The pulmonic valve is normal in structure and function without regurgitation, masses, stenosis or vegetations.  11. AORTIC ARCH, ASCENDING AND DESCENDING AORTA:  The aorta had mild atherosclerosis in the ascending or descending aorta.  The aortic arch was normal.  IMPRESSION:   1.  Mils LVH. 2.  Moderate MR. 3.  Mild TR. 4.  No PFO. 5. No clot in LA or LA appendage or LV.  RECOMMENDATIONS:    Successful cardioversion to sinus rhythm.

## 2012-03-22 ENCOUNTER — Encounter (HOSPITAL_COMMUNITY): Payer: Self-pay | Admitting: Cardiovascular Disease

## 2012-03-22 LAB — BASIC METABOLIC PANEL
BUN: 15 mg/dL (ref 6–23)
CO2: 28 mEq/L (ref 19–32)
Calcium: 9.3 mg/dL (ref 8.4–10.5)
Chloride: 104 mEq/L (ref 96–112)
Creatinine, Ser: 1 mg/dL (ref 0.50–1.35)
GFR calc Af Amer: >90 mL/min (ref >90)
GFR calc non Af Amer: 78 mL/min — ABNORMAL LOW (ref >90)
Glucose, Bld: 88 mg/dL (ref 70–99)
Potassium: 3.9 mEq/L (ref 3.5–5.1)
Sodium: 140 mEq/L (ref 135–145)

## 2012-03-22 LAB — CBC
HCT: 35.6 % — ABNORMAL LOW (ref 39.0–52.0)
Hemoglobin: 12.7 g/dL — ABNORMAL LOW (ref 13.0–17.0)
MCH: 30.3 pg (ref 26.0–34.0)
MCHC: 35.7 g/dL (ref 30.0–36.0)
MCV: 85 fL (ref 78.0–100.0)
Platelets: 153 10*3/uL (ref 150–400)
RBC: 4.19 MIL/uL — ABNORMAL LOW (ref 4.22–5.81)
RDW: 13.3 % (ref 11.5–15.5)
WBC: 7.8 10*3/uL (ref 4.0–10.5)

## 2012-03-22 MED ORDER — AMIODARONE HCL 200 MG PO TABS: 200.0000 mg | ORAL_TABLET | Freq: Every day | ORAL | Status: DC

## 2012-03-22 MED ORDER — AMIODARONE HCL 200 MG PO TABS
200.0000 mg | ORAL_TABLET | Freq: Every day | ORAL | Status: DC
  Administered 2012-03-22: 200 mg via ORAL
  Filled 2012-03-22: qty 1

## 2012-03-22 MED ORDER — ATORVASTATIN CALCIUM 40 MG PO TABS: 40.0000 mg | ORAL_TABLET | Freq: Every day | ORAL | Status: DC

## 2012-03-22 MED ORDER — RIVAROXABAN 15 MG PO TABS: 15.0000 mg | ORAL_TABLET | Freq: Two times a day (BID) | ORAL | Status: DC

## 2012-03-22 MED ORDER — RIVAROXABAN 20 MG PO TABS: 20.0000 mg | ORAL_TABLET | Freq: Every day | ORAL | Status: DC

## 2012-03-22 MED ORDER — ACETAMINOPHEN 325 MG PO TABS
650.0000 mg | ORAL_TABLET | Freq: Four times a day (QID) | ORAL | Status: DC | PRN
  Administered 2012-03-22: 650 mg via ORAL
  Filled 2012-03-22: qty 2

## 2012-03-22 MED ORDER — ASPIRIN 81 MG PO TBEC: 81.0000 mg | DELAYED_RELEASE_TABLET | Freq: Every day | ORAL | Status: DC

## 2012-03-22 NOTE — Progress Notes (Signed)
FMTS Attending Daily Note: Brandon Friesen MD [phone] pager office [phone] I have discussed this patient with the resident and reviewed the assessment and plan as documented above. I agree wit the resident's findings and plan.  

## 2012-03-22 NOTE — Progress Notes (Signed)
Family Medicine Teaching Service Daily Progress Note Service Page: [phone]  Subjective:  Feeling well this am. No complaints. Eager to go home.  Objective: Temp:  [97.5 F (36.4 C)-98.6 F (37 C)] 97.5 F (36.4 C) (03/18 0314) Pulse Rate:  [59-125] 59 (03/18 0314) Resp:  [9-64] 13 (03/18 0314) BP: (103-174)/(54-124) 108/57 mmHg (03/18 0314) SpO2:  [91 %-98 %] 91 % (03/18 0314) Weight:  [288 lb (130.636 kg)] 288 lb (130.636 kg) (03/17 1037)   Intake/Output Summary (Last 24 hours) at 03/22/12 0705 Last data filed at 03/21/12 2328  Gross per 24 hour  Intake   1250 ml  Output   1100 ml  Net    150 ml   Exam: General: resting comfortably in bed, NAD.  Cardiovascular: RRR. No murmurs, rubs, or gallops. Respiratory: CTAB. No rales, rhonchi, or wheeze. Abdomen: obese, soft, nontender, nondistended. Extremities: No LE edema. Neuro: AO x 3. No focal deficits on exam.  CBC BMET   Recent Labs Lab 03/20/12 0424 03/21/12 0440 03/22/12 0315  WBC 10.0 9.8 7.8  HGB 14.3 14.2 12.7*  HCT 39.5 39.8 35.6*  PLT 163 165 153    Recent Labs Lab 03/19/12 0919 03/20/12 0424 03/21/12 0730  NA 140 140 140  K 3.8 3.0* 3.8  CL 105 103 105  CO2 25 25 28   BUN 9 11 13   CREATININE 0.90 0.93 1.05  GLUCOSE 114* 98 96  CALCIUM 9.1 9.0 9.0     BNP    Component Value Date/Time   PROBNP 4419.0* 03/16/2012 1630   Cardiac Panel (last 3 results) No results found for this basename: CKTOTAL, CKMB, TROPONINI, RELINDX,  in the last 72 hours Lipid Panel     Component Value Date/Time   CHOL 170 03/17/2012 0432   TRIG 117 03/17/2012 0432   HDL 30* 03/17/2012 0432   CHOLHDL 5.7 03/17/2012 0432   VLDL 23 03/17/2012 0432   LDLCALC 117* 03/17/2012 0432   Imaging/Diagnostic Tests:  Ct Angio Chest Pe W/cm &/or Wo Cm 03/17/2012   IMPRESSION:  1.  Bilateral pulmonary emboli with the most proximal clot seen at the segmental level on the left.  No evidence of right heart strain. These results will be  called to the ordering clinician or representative by the Radiologist Assistant, and communication documented in the PACS Dashboard. 2.  Right thyroid nodule. Consider further evaluation with thyroid ultrasound.  If patient is clinically hyperthyroid, consider nuclear medicine thyroid uptake and scan.     Dg Hip Complete Left 02/21/2012   IMPRESSION: No acute abnormality.  Right greater than left hip osteoarthritis. Proximal left femoral shaft malunion.      Ct Head Wo Contrast 03/17/2012  IMPRESSION: No acute intracranial abnormalities.    Ct Hip Left Wo Contrast 02/21/2012  IMPRESSION: Proximal left femoral shaft deformity keeping with sequelae of prior fracture.  No displaced acute fracture is identified.  MRI has increased sensitivity for stress fracture/nondisplaced fracture if clinical concern persists.     Dg Chest Port 1 View 03/16/2012  IMPRESSION: Cardiomegaly, without acute disease.  Multifactorial degradation.  2D Echo: Study Conclusions - Left ventricle: The cavity size was normal. There was moderate concentric hypertrophy. Systolic function was vigorous. The estimated ejection fraction was in the range of 65% to 70%. Wall motion was normal; there were no regional wall motion abnormalities. - Aortic root: The aortic root was moderately dilated. - Left atrium: The atrium was moderately to severely dilated. - Right atrium: The atrium was mildly dilated.  MRI Lumbar Spine New left L4-L5 left foraminal disc herniation, likely compressing L4 nerve root and could explain symptoms.   Scheduled Meds: . aspirin EC  81 mg Oral Daily  . atorvastatin  40 mg Oral q1800  . digoxin  0.25 mg Intravenous Once  . escitalopram  20 mg Oral Daily  . magnesium sulfate 1 - 4 g bolus IVPB  2 g Intravenous Once  . polyethylene glycol  17 g Oral Daily  . rivaroxaban  15 mg Oral BID WC  . sodium chloride  3 mL Intravenous Q12H   Assessment/Plan: 62 y.o. year old male with PMH of HTN who presented  with approximately 1 week of worsening confusion, difficulty walking due to left back/hip/thigh pain and was reporting new onset of dizziness with subsequent findings of tachycardia and hypotension by his daughter who was a paramedic.  In the ED, patient found to be in Atrial fibrillation with RVR.  # Atrial fibrillation with RVR - Likely secondary to PE (see below).  TSH normal at 2.247.  - Patient was placed on Diltiazem gtt intially.  Subsequently started on Amiodarone for rate control. - Cardiology following and we greatly appreciate their help in managing this patient. - Patient underwent successful DC cardioversion yesterday.  Patient now in sinus rhythm. Amiodarone and Metoprolol discontinued last night (3/17) as patient was rate controlled with HR in the 60's.  # Pulmonary Embolus - bilateral - D dimer was obtained and was elevated at 1.78. - CTA was obtained on 3/13 and revealed bilateral pulmonary emboli. - Rivaroxaban 15 mg BID started on 3/14, cont for 21 days.  Will then increase to 20 mg daily.  Patient will need continue therapy for at least 3-6 months.  # Acute delirium - CT head neg. Likely secondary to steroids and medications (trazodone, valium, increasing intake of pain medications).  - Urine Tox - positive for marijuana metabolite and Benzo's - Delirium now resolved after cessation of narcotics/trazadone/benzodiazepine  # Back/hip/leg pain with reported fecal incontinence - Patient has a history of femoral fracture with non-union and subsequent chronic pain.  Seen by [facility] and [facility].  Had steroid dose pack, hip injection, increased amount of pain medication and diazepam.  - Recent hip CT and xray negative for fracture - MRI shows L4 left nerve root impingement. Patient is under care of  Dr. Franky Macho, who recommends laminectomy.  Dr. Franky Macho to see on Monday.  # Anxiety - Will continue Lexapro  # HTN  - Currently well controlled. - Will add home  medications as needed.  # HLD - Lipid panel revealed LDL of 117.  - Continue lipitor 40 mg daily.  # Hypokalemia - Awaiting BMP this am.  FEN/GI: Heart Healthy diet. PPx: Xarelto Dispo: Transfer out of stepdown unit.  Will discharge home today if okay with cardiology. Code Status: Full code  Everlene Other, DO 03/22/2012, 7:05 AM

## 2012-03-22 NOTE — Discharge Summary (Signed)
Family Medicine Teaching Service  Discharge Note : Attending Keyton Bhat MD Pager [phone] Office [phone] I have seen and examined this patient, reviewed their chart and discussed discharge planning wit the resident at the time of discharge. I agree with the discharge plan as above.  

## 2012-03-22 NOTE — Care Management Note (Signed)
    Page 1 of 1   03/22/2012     11:52:42 AM   CARE MANAGEMENT NOTE 03/22/2012  Patient:  Brandon [name], Brandon [name]   Account Number:  [id number]  Date Initiated:  03/22/2012  Documentation initiated by:  Donn Pierini  Subjective/Objective Assessment:   Pt admitted with c/p afib hypotension and + bil PEs     Action/Plan:   PTA pt lived at home   Anticipated DC Date:  03/22/2012   Anticipated DC Plan:  HOME/SELF CARE      DC Planning Services  CM consult      Choice offered to / List presented to:             Status of service:  Completed, signed off Medicare Important Message given?   (If response is "NO", the following Medicare IM given date fields will be blank) Date Medicare IM given:   Date Additional Medicare IM given:    Discharge Disposition:  HOME/SELF CARE  Per UR Regulation:  Reviewed for med. necessity/level of care/duration of stay  If discussed at Long Length of Stay Meetings, dates discussed:   03/22/2012    Comments:  03/22/12- 1145- Donn Pierini RN, BSN [phone] Pt s/p cardioversion 03/21/12- to be discharged today on Xarelto- [employer] is closed today for any type of benefits check- spoke with pt regarding pharmacy that he uses.- per conversation pt states that he uses [facility]- call made to pharmacy- who has received electronic scripts and pt does not have any pre-auth required- copay is $45- and pharmacy does have drug in stock and ready for pt to pick up. Pt informed of cost of copay- per pt states that this should be affordable for him. No further NCM needs.

## 2012-03-22 NOTE — Progress Notes (Signed)
Subjective:  Patient denies any chest pain or shortness of breath. Underwent TEE cardioversion yesterday tolerated procedure well remains in sinus rhythm  Objective:  Vital Signs in the last 24 hours: Temp:  [97.4 F (36.3 C)-98.6 F (37 C)] 97.4 F (36.3 C) (03/18 0736) Pulse Rate:  [59-125] 63 (03/18 0811) Resp:  [9-64] 20 (03/18 0811) BP: (103-174)/(57-124) 108/57 mmHg (03/18 0314) SpO2:  [91 %-98 %] 96 % (03/18 0811) Weight:  [130.636 kg (288 lb)] 130.636 kg (288 lb) (03/17 1037)  Intake/Output from previous day: 03/17 0701 - 03/18 0700 In: 1250 [P.O.:600; I.V.:650] Out: 1100 [Urine:1100] Intake/Output from this shift: Total I/O In: 3 [I.V.:3] Out: 0   Physical Exam: Neck: no adenopathy, no carotid bruit, no JVD and supple, symmetrical, trachea midline Lungs: clear to auscultation bilaterally Heart: regular rate and rhythm, S1, S2 normal, no murmur, click, rub or gallop Abdomen: soft, non-tender; bowel sounds normal; no masses,  no organomegaly Extremities: extremities normal, atraumatic, no cyanosis or edema  Lab Results:  Recent Labs  03/21/12 0440 03/22/12 0315  WBC 9.8 7.8  HGB 14.2 12.7*  PLT 165 153    Recent Labs  03/21/12 0730 03/22/12 0753  NA 140 140  K 3.8 3.9  CL 105 104  CO2 28 28  GLUCOSE 96 88  BUN 13 15  CREATININE 1.05 1.00   No results found for this basename: TROPONINI, CK, MB,  in the last 72 hours Hepatic Function Panel No results found for this basename: PROT, ALBUMIN, AST, ALT, ALKPHOS, BILITOT, BILIDIR, IBILI,  in the last 72 hours No results found for this basename: CHOL,  in the last 72 hours No results found for this basename: PROTIME,  in the last 72 hours  Imaging: Imaging results have been reviewed and Dg Foot 2 Views Left  03/20/2012  *RADIOLOGY REPORT*  Clinical Data: Pain and swelling  LEFT FOOT - 2 VIEW  Comparison: None.  Findings: No acute fracture and no dislocation.  Moderate degenerative changes of the first  metatarsophalangeal joint. Degenerative changes in the ankle and midfoot are noted.  Minimal spur at the inferior calcaneus.  Bone mineralization is within normal limits.  No destructive bone lesion.  IMPRESSION: Degenerative changes.   Original Report Authenticated By: Jolaine Click, M.D.     Cardiac Studies:  Assessment/Plan:  Bilateral pulmonary embolism  Status post New-onset A. fib with RVR STATUS post cardioversion Hypertension  Chronic pain syndrome  Anxiety disorder  Status post delirium secondary to meds  Plan Restart low-dose amiodarone 200 mg daily for short-term We'll restart beta blocker if his blood pressure and heart rate tolerates as outpatient Okay to discharge from cardiac point of view  LOS: 6 days    Renaud Celli N 03/22/2012, 10:05 AM

## 2012-03-24 ENCOUNTER — Other Ambulatory Visit: Payer: Federal, State, Local not specified - PPO

## 2012-07-25 ENCOUNTER — Other Ambulatory Visit (HOSPITAL_COMMUNITY): Payer: Self-pay | Admitting: Family Medicine

## 2013-03-03 ENCOUNTER — Institutional Professional Consult (permissible substitution): Payer: Federal, State, Local not specified - PPO | Admitting: Internal Medicine

## 2013-03-08 ENCOUNTER — Encounter: Payer: Self-pay | Admitting: Internal Medicine

## 2013-03-08 ENCOUNTER — Ambulatory Visit (INDEPENDENT_AMBULATORY_CARE_PROVIDER_SITE_OTHER): Payer: Federal, State, Local not specified - PPO | Admitting: Internal Medicine

## 2013-03-08 VITALS — BP 128/84 | HR 77 | Ht 76.0 in | Wt 281.0 lb

## 2013-03-08 DIAGNOSIS — I82409 Acute embolism and thrombosis of unspecified deep veins of unspecified lower extremity: Secondary | ICD-10-CM

## 2013-03-08 DIAGNOSIS — G4733 Obstructive sleep apnea (adult) (pediatric): Secondary | ICD-10-CM

## 2013-03-08 DIAGNOSIS — I4891 Unspecified atrial fibrillation: Secondary | ICD-10-CM

## 2013-03-08 DIAGNOSIS — I5189 Other ill-defined heart diseases: Secondary | ICD-10-CM

## 2013-03-08 DIAGNOSIS — I10 Essential (primary) hypertension: Secondary | ICD-10-CM

## 2013-03-08 DIAGNOSIS — I513 Intracardiac thrombosis, not elsewhere classified: Secondary | ICD-10-CM

## 2013-03-08 DIAGNOSIS — I2699 Other pulmonary embolism without acute cor pulmonale: Secondary | ICD-10-CM

## 2013-03-08 MED ORDER — WARFARIN SODIUM 5 MG PO TABS: 5.0000 mg | ORAL_TABLET | Freq: Every day | ORAL | Status: DC

## 2013-03-08 MED ORDER — ENOXAPARIN SODIUM 120 MG/0.8ML ~~LOC~~ SOLN: 120.0000 mg | Freq: Two times a day (BID) | SUBCUTANEOUS | Status: DC

## 2013-03-08 NOTE — Patient Instructions (Addendum)
Your physician recommends that you schedule a follow-up appointment in: 3 weeks with Margaret Pyle and 6 weeks with Dr Rayann Heman  Take your last dose of Xarelto tonight.    Start Lovenox 120mg  injections every 12 hours tomorrow night  Start Coumadin 5mg  once daily tomorrow.   We will check your INR on Tuesday.

## 2013-03-08 NOTE — Progress Notes (Signed)
Primary Care Physician: Nilda Simmer, MD Referring Physician:  Self Primary Cardiologist: Gertie Fey Brandon [name] is a 63 y.o. male with a h/o persistent atrial fibrillation and sleep apnea who presents for EP consultation.  He reports initially being diagnosed with atrial fibrillation 3/14 at which time he presented with fatigue and was found to be in afib.   He was also found to have bilateral PTE. He was anticoagulated with xarelto and underwent cardioversion at that time.  He reports that he remained in sinus rhythm until January 2015.  He was off of xarelto for several months but says that this was restarted in January.  He was placed on amiodarone but did not achieve sinus rhythm.  He was evaluated by Dr Elonda Husky at [facility] and was planned to have balloon cryoablation of his afib.  He presented for TEE and was found to have a large mobile thrombus.  He says that he remains on xarelto and voices compliance.  He reports symptoms of palpitations and fatigue with his afib.  He also has sleep apnea and does not tolerate CPAP.  Today, he denies symptoms of  chest pain,  orthopnea, PND, lower extremity edema, dizziness, presyncope, syncope, or neurologic sequela. The patient is tolerating medications without difficulties and is otherwise without complaint today.   Past Medical History  Diagnosis Date  . Arthritis   . Sciatica   . Anxiety   . Hypertension   . Osteoporosis   . Sleep apnea     noncompliant with CPAP  . Cataract   . Asthma     Patient and wife both dinies patient has or had asthma  . Enlarged prostate   . Left atrial enlargement   . Persistent atrial fibrillation   . Obesity   . Bilateral pulmonary embolism 3/14    admitted to [facility],  treated with Xarelto  . Chronic pain    Past Surgical History  Procedure Laterality Date  . Knee arthroscopy      left  . Colonoscopy w/ polypectomy    . Rotator cuff repair      right  . Tonsillectomy    . Cataract  extraction    . Hernia repair  07/06/2011  . Replacement total knee    . Tee without cardioversion N/A 03/21/2012    Procedure: TRANSESOPHAGEAL ECHOCARDIOGRAM (TEE);  Surgeon: Birdie Riddle, MD;  Location: [place];  Service: Cardiovascular;  Laterality: N/A;  . Cardioversion N/A 03/21/2012    Procedure: CARDIOVERSION;  Surgeon: Birdie Riddle, MD;  Location: [facility] ENDOSCOPY;  Service: Cardiovascular;  Laterality: N/A;    Current Outpatient Prescriptions  Medication Sig Dispense Refill  . amiodarone (PACERONE) 200 MG tablet Take 1 tablet (200 mg total) by mouth daily.  30 tablet  3  . diazepam (VALIUM) 5 MG tablet Take 1 tablet by mouth at bedtime.      Marland Kitchen diltiazem (CARDIZEM CD) 180 MG 24 hr capsule Take 1 capsule by mouth daily.      Marland Kitchen escitalopram (LEXAPRO) 20 MG tablet Take 20 mg by mouth daily.      Marland Kitchen gabapentin (NEURONTIN) 800 MG tablet Take 1/2 to 1 tablet at dinner and take 1 tablet at bedtime      . lidocaine (LIDODERM) 5 % Place 1 patch onto the skin as needed.      . metoprolol (LOPRESSOR) 100 MG tablet Take 1 tablet by mouth 2 (two) times daily.      . Naproxen Sodium (ALEVE)  220 MG CAPS Take 1 capsule by mouth as needed.      . tamsulosin (FLOMAX) 0.4 MG CAPS capsule Take 2 capsules by mouth daily.      Marland Kitchen enoxaparin (LOVENOX) 120 MG/0.8ML injection Inject 0.8 mLs (120 mg total) into the skin every 12 (twelve) hours.  20 Syringe  1  . warfarin (COUMADIN) 5 MG tablet Take 1 tablet (5 mg total) by mouth daily.  30 tablet  1   No current facility-administered medications for this visit.    Allergies  Allergen Reactions  . Ace Inhibitors Other (See Comments)    COUGH    History   Social History  . Marital Status: Married    Spouse Name: N/A    Number of Children: N/A  . Years of Education: N/A   Occupational History  . Not on file.   Social History Main Topics  . Smoking status: Never Smoker   . Smokeless tobacco: Never Used  . Alcohol Use: Yes     Comment: 1 beer  per month  . Drug Use: No     Comment: negative hx for IV drug abuse  . Sexual Activity: Not on file   Other Topics Concern  . Not on file   Social History Narrative   Lives with wife in [place]   Retired Dietitian    Family History  Problem Relation Age of Onset  . Cancer Father     bone  . Cancer Paternal Grandmother     ovarian    ROS- All systems are reviewed and negative except as per the HPI above  Physical Exam: Filed Vitals:   03/08/13 1608  BP: 128/84  Pulse: 77  Height: 6\' 4"  (1.93 m)  Weight: 281 lb (127.461 kg)    GEN- The patient is obese appearing, alert and oriented x 3 today.   Head- normocephalic, atraumatic Eyes-  Sclera clear, conjunctiva pink Ears- hearing intact Oropharynx- clear Neck- supple  Lungs- Clear to ausculation bilaterally, normal work of breathing Heart- irregular rate and rhythm  GI- soft, NT, ND, + BS Extremities- no clubbing, cyanosis, +1 edema MS- no significant deformity or atrophy Skin- no rash or lesion Psych- euthymic mood, full affect Neuro- strength and sensation are intact  EKG today reveals afib with V rate 77 bm, nonspecific ST/T changes More than 30 pages of records were reviewed from [place] CT 02/24/13- enlarged left atrium without appreciable thrombus.  There is typical left atrial and pulmonary venous anatomy.  The LSPV and LIPV were noted to have separate ostia.  The RSPV and RIPV had separate ostia but were close together.  There was also a right middle pulmonary vein immediately adjacent to the RSPV.  There was also mild aneurysmal dilatation of the ascending thoracic aorta (4.6cm diameter).  Pulmonary nodules were noted and felt to be benign.  TEE 02/24/13- large multilobular semi-mobile clot in the left atrial appendage (1.7cm x 1.8cm).  Moderate MR.  Mild to moderate TR.  Assessment and Plan:   1. Persistent atrial fibrillation The patient has difficult to control  afib.  He remains in afib despite medical therapy with amiodarone.  V rates are elevated at times and he is very symptomatic.  Our goal long term should be to achieve sinus rhythm.  I do think that this would be a very difficult task and will almost certainly require an ablative strategy.  Unfortunately, the patient is felt to have a large LAA  thrombus per TEE 02/24/13.  This pretty much limits [place] to rate control for now.   As he has developed thrombus on xarelto (assuming compliance which he endorses) then I would consider him to have failed this medicine.  I think that the best strategy for now would be to stop xarelto and bridge to coumadin with lovenox.  I will enroll him in our anticoagulation clinic for closer management.  Once he has had adequate INR control for 4 weeks, we will consider repeat TEE at that time to ensure that his thrombus has dissolved.  If so then we could further pursue a rhythm control strategy at that point.  I would probably favor cardioversion then with amiodarone to keep in sinus rhythm to allow his left atrium to recover. I would favor a surgical maze for this patient with LAA amputation.  Once we have been able to assure anticoagulation I will refer to Dr Roxy Manns to consider this option.  2. LAA thrombus As above chads2vasc score is 1 however he has a large semimobile thrombus on tee  (per report).  He also has a h/o pulmonary embolism and I would favor long term anticoagulation even if we were to eventually perform left atrial appendage amputation.  3. HTN Stable No change required today  4. OSA The importance of compliance with CPAP to maintain sinus rhythm was stressed today.  5. Obesity Weight loss is advised  Return to see Richardson Dopp in 3 weeks to assure that he is on the right track with anticoagulation.  He could be referred to Dr Roxy Manns at that time. Follow-up with me in 6 weeks.

## 2013-03-09 ENCOUNTER — Encounter: Payer: Self-pay | Admitting: Internal Medicine

## 2013-03-09 DIAGNOSIS — I513 Intracardiac thrombosis, not elsewhere classified: Secondary | ICD-10-CM

## 2013-03-09 DIAGNOSIS — G4733 Obstructive sleep apnea (adult) (pediatric): Secondary | ICD-10-CM | POA: Insufficient documentation

## 2013-03-09 HISTORY — DX: Intracardiac thrombosis, not elsewhere classified: I51.3

## 2013-03-14 ENCOUNTER — Ambulatory Visit (INDEPENDENT_AMBULATORY_CARE_PROVIDER_SITE_OTHER): Payer: Federal, State, Local not specified - PPO | Admitting: Pharmacist

## 2013-03-14 DIAGNOSIS — I999 Unspecified disorder of circulatory system: Secondary | ICD-10-CM | POA: Insufficient documentation

## 2013-03-14 DIAGNOSIS — I749 Embolism and thrombosis of unspecified artery: Secondary | ICD-10-CM

## 2013-03-14 DIAGNOSIS — I4891 Unspecified atrial fibrillation: Secondary | ICD-10-CM

## 2013-03-14 LAB — POCT INR: INR: 2

## 2013-03-21 ENCOUNTER — Ambulatory Visit (INDEPENDENT_AMBULATORY_CARE_PROVIDER_SITE_OTHER): Payer: Federal, State, Local not specified - PPO | Admitting: *Deleted

## 2013-03-21 DIAGNOSIS — I4891 Unspecified atrial fibrillation: Secondary | ICD-10-CM

## 2013-03-21 DIAGNOSIS — I749 Embolism and thrombosis of unspecified artery: Secondary | ICD-10-CM

## 2013-03-21 LAB — POCT INR: INR: 3.5

## 2013-03-28 ENCOUNTER — Ambulatory Visit (INDEPENDENT_AMBULATORY_CARE_PROVIDER_SITE_OTHER): Payer: Federal, State, Local not specified - PPO

## 2013-03-28 ENCOUNTER — Ambulatory Visit (INDEPENDENT_AMBULATORY_CARE_PROVIDER_SITE_OTHER): Payer: Federal, State, Local not specified - PPO | Admitting: Physician Assistant

## 2013-03-28 ENCOUNTER — Encounter: Payer: Self-pay | Admitting: Physician Assistant

## 2013-03-28 VITALS — BP 100/78 | HR 50 | Ht 76.0 in | Wt 284.0 lb

## 2013-03-28 DIAGNOSIS — I4891 Unspecified atrial fibrillation: Secondary | ICD-10-CM

## 2013-03-28 DIAGNOSIS — I513 Intracardiac thrombosis, not elsewhere classified: Secondary | ICD-10-CM

## 2013-03-28 DIAGNOSIS — I749 Embolism and thrombosis of unspecified artery: Secondary | ICD-10-CM

## 2013-03-28 DIAGNOSIS — I5189 Other ill-defined heart diseases: Secondary | ICD-10-CM

## 2013-03-28 DIAGNOSIS — I10 Essential (primary) hypertension: Secondary | ICD-10-CM

## 2013-03-28 LAB — POCT INR: INR: 3.4

## 2013-03-28 NOTE — Patient Instructions (Signed)
You have been referred to DR. OWEN WITH [facility]; DX A-FIB; CONSIDER SURGICAL MAZE and LAA AMPUTATION  MAKE SURE TO KEEP YOUR FOLLOW UP WITH DR. ALLRED ON 04/17/13  NO CHANGES WERE MADE TODAY

## 2013-03-28 NOTE — Progress Notes (Signed)
[place], [name], [place]  [address] Phone: [phone] Fax:  [phone]  Date:  03/28/2013   ID:  JOSPEH [name], DOB Feb 23, 1950, MRN [id number]  PCP:  Nilda Simmer, MD  Cardiologist:  Dr. Ricky Ala  Electrophysiologist:  Dr. Thompson Grayer    History of Present Illness: Brandon [name] is a 63 y.o. male with a hx of persistent AFib, OSA, HTN, prior pulmonary embolism in 2014.  He was originally diagnosed with atrial fibrillation in 03/2012. He was noted to have bilateral pulmonary emboli at that time. He was placed on Xarelto and underwent cardioversion. He maintained NSR until 01/2013. He had been off of Xarelto for several months but this was restarted in 01/2013. He was placed on amiodarone but did not achieve NSR. He saw Dr. Elonda Husky in [facility] with plans for balloon cryoablation. TEE demonstrated large mobile thrombus in the left atrial appendage. He saw Dr. Rayann Heman 03/08/13. Rhythm control was felt to be the best strategy. He was felt to have failed Xarelto with development of left atrial appendage thrombus. This was changed to Coumadin and he was bridged with Lovenox. Recommendation was to pursue transesophageal echocardiogram after 4 weeks of anticoagulation with therapeutic INRs. If the left atrial appendage thrombus is resolved, he could undergo cardioversion with further plans to pursue surgical maze with left atrial appendage amputation. Long-term anticoagulation has been favored. He has an appointment with Dr. Rayann Heman in several weeks. He returns for follow up today.  Patient notes continued symptoms of dizziness and dyspnea. He has noted this since he went back into atrial fibrillation several months ago. He denies any change in his symptoms. He denies syncope or near-syncope. He denies chest pain, orthopnea, edema.    Recent Labs: No results found for requested labs within last 365 days.  Wt Readings from Last 3 Encounters:  03/28/13 284 lb (128.822 kg)  03/08/13 281 lb  (127.461 kg)  03/21/12 288 lb (130.636 kg)     Past Medical History  Diagnosis Date  . Arthritis   . Sciatica   . Anxiety   . Hypertension   . Osteoporosis   . Sleep apnea     noncompliant with CPAP  . Cataract   . Asthma     Patient and wife both dinies patient has or had asthma  . Enlarged prostate   . Left atrial enlargement   . Persistent atrial fibrillation   . Obesity   . Bilateral pulmonary embolism 3/14    admitted to [facility],  treated with Xarelto  . Chronic pain     Current Outpatient Prescriptions  Medication Sig Dispense Refill  . amiodarone (PACERONE) 200 MG tablet Take 1 tablet (200 mg total) by mouth daily.  30 tablet  3  . diazepam (VALIUM) 5 MG tablet Take 1 tablet by mouth at bedtime.      Marland Kitchen diltiazem (CARDIZEM CD) 180 MG 24 hr capsule Take 1 capsule by mouth daily.      Marland Kitchen enoxaparin (LOVENOX) 120 MG/0.8ML injection Inject 0.8 mLs (120 mg total) into the skin every 12 (twelve) hours.  20 Syringe  1  . escitalopram (LEXAPRO) 20 MG tablet Take 20 mg by mouth daily.      Marland Kitchen gabapentin (NEURONTIN) 800 MG tablet Take 1/2 to 1 tablet at dinner and take 1 tablet at bedtime      . metoprolol (LOPRESSOR) 100 MG tablet Take 1 tablet by mouth 2 (two) times daily.      Marland Kitchen  Naproxen Sodium (ALEVE) 220 MG CAPS Take 1 capsule by mouth as needed.      . tamsulosin (FLOMAX) 0.4 MG CAPS capsule Take 2 capsules by mouth daily.      Marland Kitchen warfarin (COUMADIN) 5 MG tablet Take 1 tablet (5 mg total) by mouth daily.  30 tablet  1   No current facility-administered medications for this visit.    Allergies:   Ace inhibitors   Social History:  The patient  reports that he has never smoked. He has never used smokeless tobacco. He reports that he drinks alcohol. He reports that he does not use illicit drugs.   Family History:  The patient's family history includes Cancer in his father and paternal grandmother.   ROS:  Please see the history of present illness.   He has nocturia from  BPH.   All other systems reviewed and negative.   PHYSICAL EXAM: VS:  BP 100/78  Pulse 50  Ht 6\' 4"  (1.93 m)  Wt 284 lb (128.822 kg)  BMI 34.58 kg/m2 Well nourished, well developed, in no acute distress HEENT: normal Neck: no JVD Cardiac:  normal S1, S2; irregularly irregular; no murmur Lungs:  clear to auscultation bilaterally, no wheezing, rhonchi or rales Abd: soft, nontender, no hepatomegaly Ext: no edema Skin: warm and dry Neuro:  CNs 2-12 intact, no focal abnormalities noted  EKG:  HR fibrillation, HR 50     ASSESSMENT AND PLAN:  1. Atrial Fibrillation: He remains somewhat symptomatic from atrial fibrillation. His symptoms are largely unchanged. Continue current dose of amiodarone, diltiazem and metoprolol. He is tolerating Coumadin and his INRs have been therapeutic now for 2 weeks. I will review with Dr. Rayann Heman the timing of his transesophageal echocardiogram. He will keep his follow up as planned next month with Dr. Rayann Heman. 2. Left Atrial Appendage Thrombus: Continue Coumadin as outlined. 3. Hypertension: Controlled. Continue current therapy. 4. Disposition: Keep follow up with Dr. Rayann Heman next month as planned. As advised by Dr. Rayann Heman, I will refer the patient to Dr. Roxy Manns for consideration of surgical MAZE and LAA amputation procedure in the future.  Signed, Richardson Dopp, PA-C  03/28/2013 10:03 AM

## 2013-03-31 ENCOUNTER — Telehealth: Payer: Self-pay | Admitting: Physician Assistant

## 2013-03-31 ENCOUNTER — Encounter: Payer: Federal, State, Local not specified - PPO | Admitting: Thoracic Surgery (Cardiothoracic Vascular Surgery)

## 2013-03-31 NOTE — Telephone Encounter (Signed)
Please tell patient that I d/w Dr. Thompson Grayer. Dr. Rayann Heman will discuss timing of TEE with him at his next office visit. Richardson Dopp, PA-C   03/31/2013 2:02 PM

## 2013-04-02 ENCOUNTER — Telehealth: Payer: Self-pay | Admitting: Thoracic Surgery (Cardiothoracic Vascular Surgery)

## 2013-04-02 NOTE — Telephone Encounter (Signed)
Dr. Jackalyn Lombard office note from 03/08/2013 reviewed.  Patient was seen in follow up by Richardson Dopp last week who has requested surgical consultation, but follow up TEE has not been scheduled.  I spoke with Mr Spellman over the telephone, who was unaware of any plans for repeat TEE.  I offered to see him in the office tomorrow as previously scheduled, but I explained that until repeat TEE had been done it would be impossible to establish a definitive plan for his care.  He suggested that we postpone his appointment until TEE has been done.  I assured him that someone would call him tomorrow to arrange for repeat TEE.  Rexene Alberts 04/02/2013 9:41 AM

## 2013-04-03 ENCOUNTER — Encounter: Payer: Federal, State, Local not specified - PPO | Admitting: Thoracic Surgery (Cardiothoracic Vascular Surgery)

## 2013-04-03 ENCOUNTER — Telehealth: Payer: Self-pay | Admitting: *Deleted

## 2013-04-03 NOTE — Telephone Encounter (Signed)
pt's wife notified per Brynda Rim. PA conversation with Dr. Rayann Heman about TEE, that Dr. Rayann Heman will d/w pt at the time of visit on 04/17/13 . Wife states verbal understanding to Plan of Care.

## 2013-04-03 NOTE — Telephone Encounter (Signed)
pt's wife notified per Scott W. PA conversation with Dr. Allred about TEE, that Dr. Allred will d/w pt at the time of visit on 04/17/13 . Wife states verbal understanding to Plan of Care. 

## 2013-04-04 ENCOUNTER — Ambulatory Visit (INDEPENDENT_AMBULATORY_CARE_PROVIDER_SITE_OTHER): Payer: Federal, State, Local not specified - PPO | Admitting: *Deleted

## 2013-04-04 DIAGNOSIS — I749 Embolism and thrombosis of unspecified artery: Secondary | ICD-10-CM

## 2013-04-04 DIAGNOSIS — Z5181 Encounter for therapeutic drug level monitoring: Secondary | ICD-10-CM

## 2013-04-04 DIAGNOSIS — Z7901 Long term (current) use of anticoagulants: Secondary | ICD-10-CM | POA: Insufficient documentation

## 2013-04-04 DIAGNOSIS — I4891 Unspecified atrial fibrillation: Secondary | ICD-10-CM

## 2013-04-04 LAB — POCT INR: INR: 3.5

## 2013-04-11 ENCOUNTER — Ambulatory Visit (INDEPENDENT_AMBULATORY_CARE_PROVIDER_SITE_OTHER): Payer: Federal, State, Local not specified - PPO | Admitting: *Deleted

## 2013-04-11 DIAGNOSIS — Z5181 Encounter for therapeutic drug level monitoring: Secondary | ICD-10-CM

## 2013-04-11 DIAGNOSIS — I749 Embolism and thrombosis of unspecified artery: Secondary | ICD-10-CM

## 2013-04-11 DIAGNOSIS — I4891 Unspecified atrial fibrillation: Secondary | ICD-10-CM

## 2013-04-11 LAB — POCT INR: INR: 4.5

## 2013-04-14 ENCOUNTER — Other Ambulatory Visit: Payer: Self-pay

## 2013-04-17 ENCOUNTER — Ambulatory Visit (INDEPENDENT_AMBULATORY_CARE_PROVIDER_SITE_OTHER): Payer: Federal, State, Local not specified - PPO | Admitting: *Deleted

## 2013-04-17 ENCOUNTER — Encounter: Payer: Self-pay | Admitting: Internal Medicine

## 2013-04-17 ENCOUNTER — Encounter: Payer: Self-pay | Admitting: *Deleted

## 2013-04-17 ENCOUNTER — Ambulatory Visit (INDEPENDENT_AMBULATORY_CARE_PROVIDER_SITE_OTHER): Payer: Federal, State, Local not specified - PPO | Admitting: Internal Medicine

## 2013-04-17 VITALS — BP 122/78 | HR 54 | Ht 76.0 in | Wt 287.8 lb

## 2013-04-17 DIAGNOSIS — I513 Intracardiac thrombosis, not elsewhere classified: Secondary | ICD-10-CM

## 2013-04-17 DIAGNOSIS — I2699 Other pulmonary embolism without acute cor pulmonale: Secondary | ICD-10-CM

## 2013-04-17 DIAGNOSIS — Z5181 Encounter for therapeutic drug level monitoring: Secondary | ICD-10-CM

## 2013-04-17 DIAGNOSIS — G4733 Obstructive sleep apnea (adult) (pediatric): Secondary | ICD-10-CM

## 2013-04-17 DIAGNOSIS — I4891 Unspecified atrial fibrillation: Secondary | ICD-10-CM

## 2013-04-17 DIAGNOSIS — I10 Essential (primary) hypertension: Secondary | ICD-10-CM

## 2013-04-17 DIAGNOSIS — I749 Embolism and thrombosis of unspecified artery: Secondary | ICD-10-CM

## 2013-04-17 DIAGNOSIS — I5189 Other ill-defined heart diseases: Secondary | ICD-10-CM

## 2013-04-17 LAB — CBC WITH DIFFERENTIAL/PLATELET
Basophils Absolute: 0.1 10*3/uL (ref 0.0–0.1)
Basophils Relative: 0.8 % (ref 0.0–3.0)
Eosinophils Absolute: 0.5 10*3/uL (ref 0.0–0.7)
Eosinophils Relative: 6.9 % — ABNORMAL HIGH (ref 0.0–5.0)
HCT: 43.7 % (ref 39.0–52.0)
Hemoglobin: 14.7 g/dL (ref 13.0–17.0)
Lymphocytes Relative: 24.5 % (ref 12.0–46.0)
Lymphs Abs: 1.8 10*3/uL (ref 0.7–4.0)
MCHC: 33.7 g/dL (ref 30.0–36.0)
MCV: 85.5 fl (ref 78.0–100.0)
Monocytes Absolute: 0.7 10*3/uL (ref 0.1–1.0)
Monocytes Relative: 9.8 % (ref 3.0–12.0)
Neutro Abs: 4.3 10*3/uL (ref 1.4–7.7)
Neutrophils Relative %: 58 % (ref 43.0–77.0)
Platelets: 164 10*3/uL (ref 150.0–400.0)
RBC: 5.11 Mil/uL (ref 4.22–5.81)
RDW: 15.6 % — ABNORMAL HIGH (ref 11.5–14.6)
WBC: 7.5 10*3/uL (ref 4.5–10.5)

## 2013-04-17 LAB — POCT INR: INR: 1.9

## 2013-04-17 LAB — BASIC METABOLIC PANEL
BUN: 21 mg/dL (ref 6–23)
CO2: 30 mEq/L (ref 19–32)
Calcium: 9 mg/dL (ref 8.4–10.5)
Chloride: 101 mEq/L (ref 96–112)
Creatinine, Ser: 0.9 mg/dL (ref 0.4–1.5)
GFR: 87.8 mL/min (ref >60.00)
Glucose, Bld: 85 mg/dL (ref 70–99)
Potassium: 3.6 mEq/L (ref 3.5–5.1)
Sodium: 140 mEq/L (ref 135–145)

## 2013-04-17 NOTE — Patient Instructions (Signed)
Your physician has requested that you have a TEE/Cardioversion. During a TEE, sound waves are used to create images of your heart. It provides your doctor with information about the size and shape of your heart and how well your heart's chambers and valves are working. In this test, a transducer is attached to the end of a flexible tube that is guided down you throat and into your esophagus (the tube leading from your mouth to your stomach) to get a more detailed image of your heart. Once the TEE has determined that a blood clot is not present, the cardioversion begins. Electrical Cardioversion uses a jolt of electricity to your heart either through paddles or wired patches attached to your chest. This is a controlled, usually prescheduled, procedure. This procedure is done at the hospital and you are not awake during the procedure. You usually go home the day of the procedure. Please see the instruction sheet given to you today for more information.  Your physician has recommended you make the following change in your medication:  1) Decrease Metoprolol to 50mg  twice daily

## 2013-04-17 NOTE — Progress Notes (Signed)
PCP:  Nilda Simmer, MD  The patient presents today for routine electrophysiology followup.  Since last being seen in our clinic, the patient reports doing reasonably well.  He reports compliance with amiodarone.  He continues to struggle with CPAP compliance.  His SOB and fatigue are stable.  Today, he denies symptoms of palpitations, chest pain, orthopnea, PND, lower extremity edema, dizziness, presyncope, syncope, or neurologic sequela.  The patient feels that he is tolerating medications without difficulties and is otherwise without complaint today.   Past Medical History  Diagnosis Date  . Arthritis   . Sciatica   . Anxiety   . Hypertension   . Osteoporosis   . Sleep apnea     noncompliant with CPAP  . Cataract   . Asthma     Patient and wife both dinies patient has or had asthma  . Enlarged prostate   . Left atrial enlargement   . Persistent atrial fibrillation   . Obesity   . Bilateral pulmonary embolism 3/14    admitted to [facility],  treated with Xarelto  . Chronic pain    Past Surgical History  Procedure Laterality Date  . Knee arthroscopy      left  . Colonoscopy w/ polypectomy    . Rotator cuff repair      right  . Tonsillectomy    . Cataract extraction    . Hernia repair  07/06/2011  . Replacement total knee    . Tee without cardioversion N/A 03/21/2012    Procedure: TRANSESOPHAGEAL ECHOCARDIOGRAM (TEE);  Surgeon: Birdie Riddle, MD;  Location: [facility];  Service: Cardiovascular;  Laterality: N/A;  . Cardioversion N/A 03/21/2012    Procedure: CARDIOVERSION;  Surgeon: Birdie Riddle, MD;  Location: [facility] ENDOSCOPY;  Service: Cardiovascular;  Laterality: N/A;    Current Outpatient Prescriptions  Medication Sig Dispense Refill  . amiodarone (PACERONE) 200 MG tablet Take 1 tablet (200 mg total) by mouth daily.  30 tablet  3  . diazepam (VALIUM) 5 MG tablet Take 1 tablet by mouth at bedtime.      Marland Kitchen diltiazem (CARDIZEM CD) 180 MG 24 hr capsule Take 1 capsule by mouth  daily.      Marland Kitchen escitalopram (LEXAPRO) 20 MG tablet Take 20 mg by mouth daily.      Marland Kitchen gabapentin (NEURONTIN) 800 MG tablet Take 1/2 to 1 tablet at dinner and take 1 tablet at bedtime      . metoprolol (LOPRESSOR) 100 MG tablet Take 50 mg by mouth 2 (two) times daily.       . Naproxen Sodium (ALEVE) 220 MG CAPS Take 1 capsule by mouth as needed.      . tamsulosin (FLOMAX) 0.4 MG CAPS capsule Take 2 capsules by mouth daily.      Marland Kitchen warfarin (COUMADIN) 5 MG tablet Take 1 tablet (5 mg total) by mouth daily.  30 tablet  1   No current facility-administered medications for this visit.    Allergies  Allergen Reactions  . Ace Inhibitors Other (See Comments)    COUGH    History   Social History  . Marital Status: Married    Spouse Name: N/A    Number of Children: N/A  . Years of Education: N/A   Occupational History  . Not on file.   Social History Main Topics  . Smoking status: Never Smoker   . Smokeless tobacco: Never Used  . Alcohol Use: Yes     Comment: 1 beer per month  .  Drug Use: No     Comment: negative hx for IV drug abuse  . Sexual Activity: Not on file   Other Topics Concern  . Not on file   Social History Narrative   Lives with wife in [place]   Retired Dietitian    Family History  Problem Relation Age of Onset  . Cancer Father     bone  . Cancer Paternal Grandmother     ovarian    ROS-  All systems are reviewed and are negative except as outlined in the HPI above  Physical Exam: Filed Vitals:   04/17/13 1322  BP: 122/78  Pulse: 54  Height: 6\' 4"  (1.93 m)  Weight: 287 lb 12.8 oz (130.545 kg)    GEN- The patient is well appearing, alert and oriented x 3 today.   Head- normocephalic, atraumatic Eyes-  Sclera clear, conjunctiva pink Ears- hearing intact Oropharynx- clear Neck- supple, no JVP Lymph- no cervical lymphadenopathy Lungs- Clear to ausculation bilaterally, normal work of breathing Heart- irregular rate and rhythm    GI- soft, NT, ND, + BS Extremities- no clubbing, cyanosis, or edema MS- no significant deformity or atrophy Skin- no rash or lesion Psych- euthymic mood, full affect Neuro- strength and sensation are intact  ekg today reveals afib, V rate 54 bpm,   Assessment and Plan:  1. Persistent atrial fibrillation The patient has difficult to control afib.  He remains in afib despite medical therapy with amiodarone.  I will decrease metoprolol to 50mg  BID today due to SOB/ fatigue and bradycardia. As he has been on coumadin with therapeutic INRs for a month, we will proceed with TEE to evaluate his previously visualized LAA thrombus.  We will proceed with cardioversion if the clot is resolved. We will also better assess his MR with TEE.   I anticipate that I will refer to Dr Roxy Manns once LAA thrombus is resolved.  It will be important to know severity of his MR.  Ultimately, a surgical maze and LAA ligation may be a good option for him.  2. LAA thrombus As above chads2vasc score is 1 however he has a large semimobile thrombus on tee  (per report).  He also has a h/o pulmonary embolism and I would favor long term anticoagulation even if we were to eventually perform left atrial appendage amputation.  As his prior clot occurred on xarelto, I would favor coumadin long term.  3. HTN Stable No change required today  4. OSA The importance of compliance with CPAP to maintain sinus rhythm was stressed today.  5. Obesity Weight loss is advised  Follow-up with me in 6 weeks.  I will consider referral to Dr Roxy Manns at that time.

## 2013-04-24 ENCOUNTER — Encounter (HOSPITAL_COMMUNITY): Payer: Federal, State, Local not specified - PPO | Admitting: Anesthesiology

## 2013-04-24 ENCOUNTER — Ambulatory Visit (INDEPENDENT_AMBULATORY_CARE_PROVIDER_SITE_OTHER): Payer: Federal, State, Local not specified - PPO | Admitting: Pharmacist

## 2013-04-24 ENCOUNTER — Ambulatory Visit (HOSPITAL_COMMUNITY): Payer: Federal, State, Local not specified - PPO | Admitting: Anesthesiology

## 2013-04-24 ENCOUNTER — Encounter (HOSPITAL_COMMUNITY)
Admission: RE | Disposition: A | Discharge status: Home / Self Care | Payer: Self-pay | Source: Ambulatory Visit | Attending: Cardiology

## 2013-04-24 ENCOUNTER — Encounter (HOSPITAL_COMMUNITY): Payer: Self-pay | Admitting: *Deleted

## 2013-04-24 ENCOUNTER — Ambulatory Visit (HOSPITAL_COMMUNITY)
Admission: RE | Admit: 2013-04-24 | Discharge: 2013-04-24 | Disposition: A | Discharge status: Home / Self Care | Payer: Federal, State, Local not specified - PPO | Source: Ambulatory Visit | Attending: Cardiology | Admitting: Cardiology

## 2013-04-24 DIAGNOSIS — Z96659 Presence of unspecified artificial knee joint: Secondary | ICD-10-CM | POA: Insufficient documentation

## 2013-04-24 DIAGNOSIS — I10 Essential (primary) hypertension: Secondary | ICD-10-CM | POA: Insufficient documentation

## 2013-04-24 DIAGNOSIS — G4733 Obstructive sleep apnea (adult) (pediatric): Secondary | ICD-10-CM | POA: Insufficient documentation

## 2013-04-24 DIAGNOSIS — I517 Cardiomegaly: Secondary | ICD-10-CM | POA: Insufficient documentation

## 2013-04-24 DIAGNOSIS — J45909 Unspecified asthma, uncomplicated: Secondary | ICD-10-CM | POA: Insufficient documentation

## 2013-04-24 DIAGNOSIS — F329 Major depressive disorder, single episode, unspecified: Secondary | ICD-10-CM | POA: Insufficient documentation

## 2013-04-24 DIAGNOSIS — I4891 Unspecified atrial fibrillation: Secondary | ICD-10-CM | POA: Insufficient documentation

## 2013-04-24 DIAGNOSIS — I340 Nonrheumatic mitral (valve) insufficiency: Secondary | ICD-10-CM

## 2013-04-24 DIAGNOSIS — I749 Embolism and thrombosis of unspecified artery: Secondary | ICD-10-CM

## 2013-04-24 DIAGNOSIS — M810 Age-related osteoporosis without current pathological fracture: Secondary | ICD-10-CM | POA: Insufficient documentation

## 2013-04-24 DIAGNOSIS — Z86711 Personal history of pulmonary embolism: Secondary | ICD-10-CM | POA: Insufficient documentation

## 2013-04-24 DIAGNOSIS — N400 Enlarged prostate without lower urinary tract symptoms: Secondary | ICD-10-CM | POA: Insufficient documentation

## 2013-04-24 DIAGNOSIS — E669 Obesity, unspecified: Secondary | ICD-10-CM | POA: Insufficient documentation

## 2013-04-24 DIAGNOSIS — Z5181 Encounter for therapeutic drug level monitoring: Secondary | ICD-10-CM

## 2013-04-24 DIAGNOSIS — G8929 Other chronic pain: Secondary | ICD-10-CM | POA: Insufficient documentation

## 2013-04-24 DIAGNOSIS — I059 Rheumatic mitral valve disease, unspecified: Secondary | ICD-10-CM

## 2013-04-24 DIAGNOSIS — F411 Generalized anxiety disorder: Secondary | ICD-10-CM | POA: Insufficient documentation

## 2013-04-24 DIAGNOSIS — Z7901 Long term (current) use of anticoagulants: Secondary | ICD-10-CM | POA: Insufficient documentation

## 2013-04-24 DIAGNOSIS — F3289 Other specified depressive episodes: Secondary | ICD-10-CM | POA: Insufficient documentation

## 2013-04-24 HISTORY — DX: Nonrheumatic mitral (valve) insufficiency: I34.0

## 2013-04-24 HISTORY — PX: TEE WITHOUT CARDIOVERSION: SHX5443

## 2013-04-24 HISTORY — PX: CARDIOVERSION: SHX1299

## 2013-04-24 LAB — POCT INR: INR: 2.1

## 2013-04-24 SURGERY — ECHOCARDIOGRAM, TRANSESOPHAGEAL
Anesthesia: General

## 2013-04-24 MED ORDER — SODIUM CHLORIDE 0.9 % IV SOLN
INTRAVENOUS | Status: DC
  Administered 2013-04-24: 13:00:00 via INTRAVENOUS

## 2013-04-24 MED ORDER — FENTANYL CITRATE 0.05 MG/ML IJ SOLN
INTRAMUSCULAR | Status: AC
  Filled 2013-04-24: qty 2

## 2013-04-24 MED ORDER — FENTANYL CITRATE 0.05 MG/ML IJ SOLN
INTRAMUSCULAR | Status: DC | PRN
  Administered 2013-04-24 (×4): 25 ug via INTRAVENOUS

## 2013-04-24 MED ORDER — SODIUM CHLORIDE 0.9 % IV SOLN
INTRAVENOUS | Status: DC | PRN
  Administered 2013-04-24: 15:00:00 via INTRAVENOUS

## 2013-04-24 MED ORDER — MIDAZOLAM HCL 5 MG/ML IJ SOLN
INTRAMUSCULAR | Status: AC
  Filled 2013-04-24: qty 2

## 2013-04-24 MED ORDER — PROPOFOL 10 MG/ML IV BOLUS
INTRAVENOUS | Status: DC | PRN
  Administered 2013-04-24: 30 mg via INTRAVENOUS
  Administered 2013-04-24: 50 mg via INTRAVENOUS

## 2013-04-24 MED ORDER — SODIUM CHLORIDE 0.9 % IV SOLN: INTRAVENOUS | Status: DC

## 2013-04-24 MED ORDER — MIDAZOLAM HCL 10 MG/2ML IJ SOLN
INTRAMUSCULAR | Status: DC | PRN
  Administered 2013-04-24 (×5): 2 mg via INTRAVENOUS

## 2013-04-24 MED ORDER — BUTAMBEN-TETRACAINE-BENZOCAINE 2-2-14 % EX AERO
INHALATION_SPRAY | CUTANEOUS | Status: DC | PRN
  Administered 2013-04-24: 2 via TOPICAL

## 2013-04-24 NOTE — H&P (View-Only) (Signed)
PCP:  Nilda Simmer, MD  The patient presents today for routine electrophysiology followup.  Since last being seen in our clinic, the patient reports doing reasonably well.  He reports compliance with amiodarone.  He continues to struggle with CPAP compliance.  His SOB and fatigue are stable.  Today, he denies symptoms of palpitations, chest pain, orthopnea, PND, lower extremity edema, dizziness, presyncope, syncope, or neurologic sequela.  The patient feels that he is tolerating medications without difficulties and is otherwise without complaint today.   Past Medical History  Diagnosis Date  . Arthritis   . Sciatica   . Anxiety   . Hypertension   . Osteoporosis   . Sleep apnea     noncompliant with CPAP  . Cataract   . Asthma     Patient and wife both dinies patient has or had asthma  . Enlarged prostate   . Left atrial enlargement   . Persistent atrial fibrillation   . Obesity   . Bilateral pulmonary embolism 3/14    admitted to [facility],  treated with Xarelto  . Chronic pain    Past Surgical History  Procedure Laterality Date  . Knee arthroscopy      left  . Colonoscopy w/ polypectomy    . Rotator cuff repair      right  . Tonsillectomy    . Cataract extraction    . Hernia repair  07/06/2011  . Replacement total knee    . Tee without cardioversion N/A 03/21/2012    Procedure: TRANSESOPHAGEAL ECHOCARDIOGRAM (TEE);  Surgeon: Birdie Riddle, MD;  Location: [facility];  Service: Cardiovascular;  Laterality: N/A;  . Cardioversion N/A 03/21/2012    Procedure: CARDIOVERSION;  Surgeon: Birdie Riddle, MD;  Location: [facility] ENDOSCOPY;  Service: Cardiovascular;  Laterality: N/A;    Current Outpatient Prescriptions  Medication Sig Dispense Refill  . amiodarone (PACERONE) 200 MG tablet Take 1 tablet (200 mg total) by mouth daily.  30 tablet  3  . diazepam (VALIUM) 5 MG tablet Take 1 tablet by mouth at bedtime.      Marland Kitchen diltiazem (CARDIZEM CD) 180 MG 24 hr capsule Take 1 capsule by mouth  daily.      Marland Kitchen escitalopram (LEXAPRO) 20 MG tablet Take 20 mg by mouth daily.      Marland Kitchen gabapentin (NEURONTIN) 800 MG tablet Take 1/2 to 1 tablet at dinner and take 1 tablet at bedtime      . metoprolol (LOPRESSOR) 100 MG tablet Take 50 mg by mouth 2 (two) times daily.       . Naproxen Sodium (ALEVE) 220 MG CAPS Take 1 capsule by mouth as needed.      . tamsulosin (FLOMAX) 0.4 MG CAPS capsule Take 2 capsules by mouth daily.      Marland Kitchen warfarin (COUMADIN) 5 MG tablet Take 1 tablet (5 mg total) by mouth daily.  30 tablet  1   No current facility-administered medications for this visit.    Allergies  Allergen Reactions  . Ace Inhibitors Other (See Comments)    COUGH    History   Social History  . Marital Status: Married    Spouse Name: N/A    Number of Children: N/A  . Years of Education: N/A   Occupational History  . Not on file.   Social History Main Topics  . Smoking status: Never Smoker   . Smokeless tobacco: Never Used  . Alcohol Use: Yes     Comment: 1 beer per month  .  Drug Use: No     Comment: negative hx for IV drug abuse  . Sexual Activity: Not on file   Other Topics Concern  . Not on file   Social History Narrative   Lives with wife in [place]   Retired Dietitian    Family History  Problem Relation Age of Onset  . Cancer Father     bone  . Cancer Paternal Grandmother     ovarian    ROS-  All systems are reviewed and are negative except as outlined in the HPI above  Physical Exam: Filed Vitals:   04/17/13 1322  BP: 122/78  Pulse: 54  Height: 6\' 4"  (1.93 m)  Weight: 287 lb 12.8 oz (130.545 kg)    GEN- The patient is well appearing, alert and oriented x 3 today.   Head- normocephalic, atraumatic Eyes-  Sclera clear, conjunctiva pink Ears- hearing intact Oropharynx- clear Neck- supple, no JVP Lymph- no cervical lymphadenopathy Lungs- Clear to ausculation bilaterally, normal work of breathing Heart- irregular rate and rhythm    GI- soft, NT, ND, + BS Extremities- no clubbing, cyanosis, or edema MS- no significant deformity or atrophy Skin- no rash or lesion Psych- euthymic mood, full affect Neuro- strength and sensation are intact  ekg today reveals afib, V rate 54 bpm,   Assessment and Plan:  1. Persistent atrial fibrillation The patient has difficult to control afib.  He remains in afib despite medical therapy with amiodarone.  I will decrease metoprolol to 50mg  BID today due to SOB/ fatigue and bradycardia. As he has been on coumadin with therapeutic INRs for a month, we will proceed with TEE to evaluate his previously visualized LAA thrombus.  We will proceed with cardioversion if the clot is resolved. We will also better assess his MR with TEE.   I anticipate that I will refer to Dr Roxy Manns once LAA thrombus is resolved.  It will be important to know severity of his MR.  Ultimately, a surgical maze and LAA ligation may be a good option for him.  2. LAA thrombus As above chads2vasc score is 1 however he has a large semimobile thrombus on tee  (per report).  He also has a h/o pulmonary embolism and I would favor long term anticoagulation even if we were to eventually perform left atrial appendage amputation.  As his prior clot occurred on xarelto, I would favor coumadin long term.  3. HTN Stable No change required today  4. OSA The importance of compliance with CPAP to maintain sinus rhythm was stressed today.  5. Obesity Weight loss is advised  Follow-up with me in 6 weeks.  I will consider referral to Dr Roxy Manns at that time.

## 2013-04-24 NOTE — Anesthesia Preprocedure Evaluation (Signed)
Anesthesia Evaluation  Patient identified by MRN, date of birth, ID band Patient awake    Reviewed: Allergy & Precautions, H&P , NPO status , Patient's Chart, lab work & pertinent test results  History of Anesthesia Complications Negative for: history of anesthetic complications  Airway Mallampati: II TM Distance: >3 FB Neck ROM: Full    Dental  (+) Teeth Intact, Dental Advisory Given   Pulmonary asthma , sleep apnea ,    Pulmonary exam normal       Cardiovascular hypertension, Pt. on medications and Pt. on home beta blockers     Neuro/Psych PSYCHIATRIC DISORDERS Anxiety negative neurological ROS     GI/Hepatic negative GI ROS, Neg liver ROS,   Endo/Other  negative endocrine ROS  Renal/GU negative Renal ROS     Musculoskeletal   Abdominal   Peds  Hematology   Anesthesia Other Findings   Reproductive/Obstetrics                           Anesthesia Physical Anesthesia Plan  ASA: II  Anesthesia Plan: General   Post-op Pain Management:    Induction: Intravenous  Airway Management Planned:   Additional Equipment:   Intra-op Plan:   Post-operative Plan:   Informed Consent: I have reviewed the patients History and Physical, chart, labs and discussed the procedure including the risks, benefits and alternatives for the proposed anesthesia with the patient or authorized representative who has indicated his/her understanding and acceptance.   Dental advisory given  Plan Discussed with: CRNA, Anesthesiologist and Surgeon  Anesthesia Plan Comments:         Anesthesia Quick Evaluation

## 2013-04-24 NOTE — CV Procedure (Signed)
     Transesophageal Echocardiogram Note  BYRANT VALENT [id number] July 08, 1950  Procedure: Transesophageal Echocardiogram Indications: atrial fibrillation  Procedure Details Consent: Obtained Time Out: Verified patient identification, verified procedure, site/side was marked, verified correct patient position, special equipment/implants available, Radiology Safety Procedures followed,  medications/allergies/relevent history reviewed, required imaging and test results available.  Performed  Medications: Fentanyl: 100 mcg  Versed: 10 mg  Left Ventrical:  Normal size and function, LVEF 50-55%, no WMA  Mitral Valve: normal, mild MR, no vegetation  Aortic Valve: Normal, no AI, no vegetation  Tricuspid Valve: Normal, moderate TR, RVSP 28 mmHg + RAP, no vegetation  Pulmonic Valve: Normal  Left Atrium/ Left atrial appendage: Dilated LA, no thrombus in LA or LAA, mildly decreased filling and emptying velocities  Atrial septum: normal, no asd  Aorta: mild non-mobile AS plague   Complications: No apparent complications Patient did tolerate procedure well.   Cardioversion Note  DANISH RUFFINS [id number] 06-23-1950  Procedure: DC Cardioversion Indications: a-fib  Procedure Details Consent: Obtained Time Out: Verified patient identification, verified procedure, site/side was marked, verified correct patient position, special equipment/implants available, Radiology Safety Procedures followed,  medications/allergies/relevent history reviewed, required imaging and test results available.  Performed  The patient has been on adequate anticoagulation.  The patient received IV ( 80 mg iv Propofo) sedation by anesthesia staff.  Synchronous cardioversion was performed at 150, 200 and 200 joules.  The cardioversion was successful.   Complications: No apparent complications Patient did tolerate procedure well.   Dorothy Spark, MD, [facility] 04/24/2013, 1:23 PM

## 2013-04-24 NOTE — Interval H&P Note (Signed)
History and Physical Interval Note:  04/24/2013 1:22 PM  Brandon [name]  has presented today for surgery, with the diagnosis of  AFIB   The various methods of treatment have been discussed with the patient and family. After consideration of risks, benefits and other options for treatment, the patient has consented to  Procedure(s): TRANSESOPHAGEAL ECHOCARDIOGRAM (TEE) (N/A) CARDIOVERSION (N/A) as a surgical intervention .  The patient's history has been reviewed, patient examined, no change in status, stable for surgery.  I have reviewed the patient's chart and labs.  Questions were answered to the patient's satisfaction.     Dorothy Spark

## 2013-04-24 NOTE — Progress Notes (Signed)
Echocardiogram Echocardiogram Transesophageal has been performed.  [place] 04/24/2013, 3:01 PM

## 2013-04-24 NOTE — Anesthesia Postprocedure Evaluation (Signed)
  Anesthesia Post-op Note  Patient: Brandon [name]  Procedure(s) Performed: Procedure(s): TRANSESOPHAGEAL ECHOCARDIOGRAM (TEE) (N/A) CARDIOVERSION (N/A)  Patient Location: Endoscopy Unit  Anesthesia Type:General  Level of Consciousness: awake, alert , oriented and patient cooperative  Airway and Oxygen Therapy: Patient Spontanous Breathing and Patient connected to nasal cannula oxygen  Post-op Pain: none  Post-op Assessment: Post-op Vital signs reviewed, Patient's Cardiovascular Status Stable, Respiratory Function Stable, Patent Airway and No signs of Nausea or vomiting  Post-op Vital Signs: Reviewed and stable  Last Vitals:  Filed Vitals:   04/24/13 1440  BP: 163/98  Pulse: 60  Temp:   Resp:     Complications: No apparent anesthesia complications

## 2013-04-24 NOTE — Transfer of Care (Signed)
Immediate Anesthesia Transfer of Care Note  Patient: Brandon [name]  Procedure(s) Performed: Procedure(s): TRANSESOPHAGEAL ECHOCARDIOGRAM (TEE) (N/A) CARDIOVERSION (N/A)  Patient Location: Endoscopy  Anesthesia Type:General  Level of Consciousness: awake, alert  and oriented  Airway & Oxygen Therapy: Patient Spontanous Breathing and Patient connected to nasal cannula oxygen  Post-op Assessment: Report given to PACU RN, Post -op Vital signs reviewed and stable and Patient moving all extremities X 4  Post vital signs: Reviewed and stable  Complications: No apparent anesthesia complications

## 2013-04-24 NOTE — Discharge Instructions (Addendum)
Electrical Cardioversion, Care After Refer to this sheet in the next few weeks. These instructions provide you with information on caring for yourself after your procedure. Your health care provider may also give you more specific instructions. Your treatment has been planned according to current medical practices, but problems sometimes occur. Call your health care provider if you have any problems or questions after your procedure. WHAT TO EXPECT AFTER THE PROCEDURE After your procedure, it is typical to have the following sensations:  Some redness on the skin where the shocks were delivered. If this is tender, a sunburn lotion or hydrocortisone cream may help.  Possible return of an abnormal heart rhythm within hours or days after the procedure. HOME CARE INSTRUCTIONS  Only take medicine as directed by your health care provider. Be sure you understand how and when to take your medicine.  Learn how to feel your pulse and check it often.  Limit your activity for 48 hours after the procedure or as directed.  Avoid or minimize caffeine and other stimulants as directed. SEEK MEDICAL CARE IF:  You feel like your heart is beating too fast or your pulse is not regular.  You have any questions about your medicines.  You have bleeding that will not stop. SEEK IMMEDIATE MEDICAL CARE IF:  You are dizzy or feel faint.  It is hard to breathe or you feel short of breath.  There is a change in discomfort in your chest.  Your speech is slurred or you have trouble moving an arm or leg on one side of your body.  You get a serious muscle cramp that does not go away.  Your fingers or toes turn cold or blue. MAKE SURE YOU:   Understand these instructions.   Will watch your condition.   Will get help right away if you are not doing well or get worse. Document Released: 10/12/2012 Document Reviewed: 07/06/2012 [facility] Patient Information 2014 [place], [place].

## 2013-04-25 ENCOUNTER — Encounter (HOSPITAL_COMMUNITY): Payer: Self-pay | Admitting: Cardiology

## 2013-04-25 DIAGNOSIS — I340 Nonrheumatic mitral (valve) insufficiency: Secondary | ICD-10-CM

## 2013-04-25 HISTORY — DX: Nonrheumatic mitral (valve) insufficiency: I34.0

## 2013-04-26 ENCOUNTER — Institutional Professional Consult (permissible substitution) (INDEPENDENT_AMBULATORY_CARE_PROVIDER_SITE_OTHER): Payer: Federal, State, Local not specified - PPO | Admitting: Thoracic Surgery (Cardiothoracic Vascular Surgery)

## 2013-04-26 ENCOUNTER — Other Ambulatory Visit: Payer: Self-pay | Admitting: *Deleted

## 2013-04-26 ENCOUNTER — Encounter: Payer: Self-pay | Admitting: Thoracic Surgery (Cardiothoracic Vascular Surgery)

## 2013-04-26 VITALS — BP 134/85 | HR 47 | Resp 16 | Ht 76.0 in | Wt 284.0 lb

## 2013-04-26 DIAGNOSIS — I059 Rheumatic mitral valve disease, unspecified: Secondary | ICD-10-CM

## 2013-04-26 DIAGNOSIS — I2699 Other pulmonary embolism without acute cor pulmonale: Secondary | ICD-10-CM

## 2013-04-26 DIAGNOSIS — I340 Nonrheumatic mitral (valve) insufficiency: Secondary | ICD-10-CM

## 2013-04-26 DIAGNOSIS — I4891 Unspecified atrial fibrillation: Secondary | ICD-10-CM

## 2013-04-26 DIAGNOSIS — I4819 Other persistent atrial fibrillation: Secondary | ICD-10-CM

## 2013-04-26 DIAGNOSIS — I472 Ventricular tachycardia: Secondary | ICD-10-CM

## 2013-04-26 DIAGNOSIS — I4729 Other ventricular tachycardia: Secondary | ICD-10-CM

## 2013-04-26 NOTE — H&P (Signed)
[address]       [place] [address]             [phone]     CARDIOTHORACIC SURGERY CONSULTATION REPORT  Referring Provider is Thompson Grayer, MD PCP is Nilda Simmer, MD  Chief Complaint  Patient presents with  . Atrial Fibrillation    eval for MAZE/LEFT ATRIAL APPENDAGE CLIPPING..had a cardioversion yesterday...TEE 04/24/13    HPI:  Patient is a 63 year old retired white male from [place] with history of hypertension, obstructive sleep apnea, remote history pulmonary embolism, and recurrent persistent atrial fibrillation with recently diagnosed left atrial thrombus who has been referred to discuss possible surgical maze procedure. The patient states that he has been physically active for most of his life. He has enjoyed both swimming and cycling on a regular basis. A little over a year ago he developed severe back pain related to degenerative disc disease of the lumbar spine. At that time he was treated with oral pain relievers and instructed to stop all strenuous activity.  Several weeks later he was hospitalized in March of 2014 with rapid atrial fibrillation. An echocardiogram was performed at that time demonstrating normal left ventricular function and no significant valvular heart disease. He underwent DC cardioversion during that admission and he was anticoagulated using Xarelto.  He was also found to have bilateral pulmonary emboli during that admission.  He was on satisfied with followup arrangements with his cardiologist at that time, and subsequently he transferred his care to Dr. Donnetta Hutching in [facility]. He apparently went back into atrial fibrillation at some point and was started on amiodarone.  Ultimately he was scheduled for possible balloon cryoablation by Dr. Elonda Husky in [facility]. However, he underwent transesophageal echocardiogram prior to the procedure and was found to have left atrial thrombus despite the fact that he remained anticoagulated on  Xarelto.  The ablation procedure was canceled and the patient discharged home, but no followup arrangements were made for more than 6 weeks. Subsequently the patient was evaluated last month by Dr. Rayann Heman.  The patient was subsequently changed to Coumadin therapy and referred to consider possible surgical maze procedure. During the interim period time the patient underwent followup transesophageal echocardiogram 2 days ago which revealed no signs of persistent left atrial thrombus. He subsequently underwent DC cardioversion to sinus rhythm.  The patient is retired having previously worked for the [employer]. He lives in [place] with his wife. He had remains physically active for most of his life. Last summer he had gone back to go swimming and riding a bicycle, but since then he has developed progressive exertional shortness of breath and fatigue. Once is left atrial thrombus was discovered he was instructed to avoid strenuous exertion. Up until he underwent cardioversion 2 days ago he continued to complain of chronic fatigue as well as exertional shortness of breath. However, he also notes that he has been unable to have a good night sleep for probably at least 15 years. He has known obstructive sleep apnea but in the past he has been intolerant of CPAP. He also experiences nocturia which further limits his ability to sleep at night. He describes stable symptoms of exertional shortness of breath which do seem to limit his physical activity considerably. He denies any history of dizzy spells or syncope. He has not recently had any symptoms of palpitations.  He has occasional twinges of substernal chest tightness that seemed to be sporadic and not necessarily related to exertion.  Past Medical History  Diagnosis Date  . Arthritis   . Sciatica   . Anxiety   . Hypertension   . Osteoporosis   . Sleep apnea     noncompliant with CPAP  . Cataract   . Asthma     Patient and wife both dinies patient has or had  asthma  . Enlarged prostate   . Left atrial enlargement   . Persistent atrial fibrillation   . Obesity   . Bilateral pulmonary embolism 3/14    admitted to [facility],  treated with Xarelto  . Chronic pain   . Thrombus of left atrial appendage 03/09/2013  . Mitral regurgitation 04/24/2013    Mild by TEE    Past Surgical History  Procedure Laterality Date  . Knee arthroscopy      left  . Colonoscopy w/ polypectomy    . Rotator cuff repair      right  . Tonsillectomy    . Cataract extraction    . Hernia repair  07/06/2011  . Replacement total knee    . Tee without cardioversion N/A 03/21/2012    Procedure: TRANSESOPHAGEAL ECHOCARDIOGRAM (TEE);  Surgeon: Birdie Riddle, MD;  Location: [place];  Service: Cardiovascular;  Laterality: N/A;  . Cardioversion N/A 03/21/2012    Procedure: CARDIOVERSION;  Surgeon: Birdie Riddle, MD;  Location: [facility] ENDOSCOPY;  Service: Cardiovascular;  Laterality: N/A;  . Tee without cardioversion N/A 04/24/2013    Procedure: TRANSESOPHAGEAL ECHOCARDIOGRAM (TEE);  Surgeon: Dorothy Spark, MD;  Location: [place];  Service: Cardiovascular;  Laterality: N/A;  . Cardioversion N/A 04/24/2013    Procedure: CARDIOVERSION;  Surgeon: Dorothy Spark, MD;  Location: [facility] ENDOSCOPY;  Service: Cardiovascular;  Laterality: N/A;    Family History  Problem Relation Age of Onset  . Cancer Father     bone  . Cancer Paternal Grandmother     ovarian    History   Social History  . Marital Status: Married    Spouse Name: N/A    Number of Children: N/A  . Years of Education: N/A   Occupational History  . Not on file.   Social History Main Topics  . Smoking status: Never Smoker   . Smokeless tobacco: Never Used  . Alcohol Use: Yes     Comment: 1 beer per month  . Drug Use: No     Comment: negative hx for IV drug abuse  . Sexual Activity: Not on file   Other Topics Concern  . Not on file   Social History Narrative   Lives with wife in [place]    Retired Dietitian    Current Outpatient Prescriptions  Medication Sig Dispense Refill  . amiodarone (PACERONE) 200 MG tablet Take 1 tablet (200 mg total) by mouth daily.  30 tablet  3  . diazepam (VALIUM) 5 MG tablet Take 1 tablet by mouth at bedtime.      Marland Kitchen diltiazem (CARDIZEM CD) 180 MG 24 hr capsule Take 1 capsule by mouth daily.      Marland Kitchen escitalopram (LEXAPRO) 20 MG tablet Take 20 mg by mouth daily.      Marland Kitchen gabapentin (NEURONTIN) 800 MG tablet Take 1/2 to 1 tablet at dinner and take 1 tablet at bedtime      . metoprolol (LOPRESSOR) 100 MG tablet Take 50 mg by mouth 2 (two) times daily.       . Naproxen Sodium (ALEVE) 220 MG CAPS Take 1 capsule by mouth as  needed.      . tamsulosin (FLOMAX) 0.4 MG CAPS capsule Take 2 capsules by mouth daily.      Marland Kitchen warfarin (COUMADIN) 5 MG tablet Take 5 mg by mouth daily. OR AS DIREDCTED       No current facility-administered medications for this visit.    Allergies  Allergen Reactions  . Ace Inhibitors Other (See Comments)    COUGH      Review of Systems:   General:  normal appetite, decreased energy, no weight gain, no weight loss, no fever  Cardiac:  occasional chest pain with exertion, occasional chest pain at rest, + SOB with exertion, occasional resting SOB, no PND, no orthopnea, no palpitations, + arrhythmia, + atrial fibrillation, no LE edema, no dizzy spells, no syncope  Respiratory:  + shortness of breath, no home oxygen, no productive cough, no dry cough, no bronchitis, no wheezing, no hemoptysis, no asthma, no pain with inspiration or cough, + sleep apnea, not currently using CPAP at night  GI:   no difficulty swallowing, no reflux, no frequent heartburn, no hiatal hernia, no abdominal pain, no constipation, no diarrhea, no hematochezia, no hematemesis, no melena  GU:   no dysuria,  + nocturia, + frequency, no urinary tract infection, no hematuria, + enlarged prostate, no kidney stones, no kidney disease  Vascular:  no  pain suggestive of claudication, no pain in feet, no leg cramps, no varicose veins, + DVT, no non-healing foot ulcer  Neuro:   no stroke, no TIA's, no seizures, no headaches, no temporary blindness one eye,  no slurred speech, no peripheral neuropathy, no chronic pain, no instability of gait, + memory/cognitive dysfunction, + difficulty concentrating and intermittent confusion  Musculoskeletal: + arthritis in both knees L>R, both hips and back, no joint swelling, no myalgias, no difficulty walking, normal mobility   Skin:   no rash, no itching, no skin infections, no pressure sores or ulcerations  Psych:   no anxiety, + depression, no nervousness, no unusual recent stress, + difficulty with concentration  Eyes:   no blurry vision, no floaters, no recent vision changes, + wears glasses   ENT:   no hearing loss, no loose or painful teeth, no dentures, last saw dentist within the past year  Hematologic:  no easy bruising, no abnormal bleeding, no clotting disorder, no frequent epistaxis, no bleeding complications on coumadin or Xarelto  Endocrine:  no diabetes, does not check CBG's at home     Physical Exam:   BP 134/85  Pulse 47  Resp 16  Ht 6\' 4"  (1.93 m)  Wt 284 lb (128.822 kg)  BMI 34.58 kg/m2  SpO2 95%  General:  Mildly obese,  well-appearing  HEENT:  Unremarkable   Neck:   no JVD, no bruits, no adenopathy   Chest:   clear to auscultation, symmetrical breath sounds, no wheezes, no rhonchi   CV:   RRR, no murmur   Abdomen:  soft, non-tender, no masses   Extremities:  warm, well-perfused, pulses palpable, no LE edema  Rectal/GU  Deferred  Neuro:   Grossly non-focal and symmetrical throughout  Skin:   Clean and dry, no rashes, no breakdown   Diagnostic Tests:  2 channel telemetry rhythm strip demonstrates normal sinus rhythm   Transesophageal Echocardiography with Cardioversion  Patient: Adrin, Julian MR #: [id number] Study Date: 04/24/2013 Gender: M Age: 50 Height:  193cm Weight: 129.1kg BSA: 2.39m^2 Pt. Status: Room: [facility]  SONOGRAPHER Joanie Coddington, RDCS ADMITTING Ena Dawley, M.D. ATTENDING Ena Dawley,  M.D. ORDERING Ena Dawley, M.D. PERFORMING Ena Dawley, M.D. cc:  ------------------------------------------------------------  ------------------------------------------------------------ Indications: Atrial fibrillation - 427.31.  ------------------------------------------------------------ History: Risk factors: Hypertension.  ------------------------------------------------------------ Study Conclusions  - Left ventricle: No evidence of thrombus. - Mitral valve: Mild regurgitation. - Left atrium: The atrium was dilated. No evidence of thrombus in the atrial cavity or appendage. No evidence of thrombus in the atrial cavity or appendage. The appendage was well visualized and moderately dilated. Emptying velocity was mildly reduced. - Recommendations: No evidence of thrombus in left atrium or left atrial appendage. The TEE was followed by a successful cardioversion. Impressions:  - No cardiac source of emboli was indentified. Successful cardioversion. Recommendations: No evidence of thrombus in left atrium or left atrial appendage. The TEE was followed by a successful cardioversion. Transesophageal echocardiography with cardioversion. 2D and intravenous contrast injection. Height: Height: 193cm. Height: 76in. Weight: Weight: 129.1kg. Weight: 284lb. Body mass index: BMI: 34.6kg/m^2. Body surface area: BSA: 2.66m^2. Blood pressure: 204/119. Patient status: Outpatient. Location: Endoscopy.  ------------------------------------------------------------  ------------------------------------------------------------ Left ventricle: No evidence of thrombus.  ------------------------------------------------------------ Aortic valve: Structurally normal valve. Doppler:  No regurgitation.  ------------------------------------------------------------ Aorta: The aorta was mildly diseased.  ------------------------------------------------------------ Mitral valve: Structurally normal valve. Doppler: Mild regurgitation.  ------------------------------------------------------------ Left atrium: The atrium was dilated. No evidence of thrombus in the atrial cavity or appendage. No evidence of thrombus in the atrial cavity or appendage. The appendage was well visualized and moderately dilated. Emptying velocity was mildly reduced.  ------------------------------------------------------------ Right ventricle: The cavity size was normal. Systolic function was normal.  ------------------------------------------------------------ Pulmonic valve: Structurally normal valve.  ------------------------------------------------------------ Right atrium: The atrium was normal in size. The appendage was well visualized and morphologically a right appendage.  ------------------------------------------------------------ Pericardium: The pericardium was normal in appearance.  ------------------------------------------------------------ Post procedure conclusions Ascending Aorta:  - The aorta was mildly diseased.  ------------------------------------------------------------  Doppler measurements Normal Tricuspid valve Regurg peak 267 cm/s ------ vel Peak RV-RA 29 mm ------ gradient, S Hg Max regurg 267 cm/s ------ vel  ------------------------------------------------------------ Prepared and Electronically Authenticated by  Ena Dawley, M.D. 2015-04-20T15:18:[id number]         Impression:  Patient has history of recurrent persistent atrial fibrillation associated with the development of left atrial thrombus while on both amiodarone and anticoagulation therapy using Xarelto.  He complains of exertional shortness of breath and fatigue without  palpitations.  He has occasional symptoms of atypical chest discomfort.  Transesophageal echocardiogram performed 2 days ago demonstrates that the left atrial thrombus has resolved on Coumadin therapy, and he subsequently underwent DC cardioversion.  He is maintaining sinus rhythm so far. TEE also reveals that the patient has moderate to severe left atrial enlargement with mild mitral regurgitation and normal left ventricular systolic function.  In addition, the patient has long-standing obstructive sleep apnea as he has not been compliant with CPAP therapy. He apparently has a followup appointment scheduled to consider a new type of CPAP mask.  This certainly may be contributing to his symptoms of chronic fatigue.   Plan:  I discussed options at length with the patient and his wife in the office today. Given that he just underwent repeat DC cardioversion 2 days ago and is maintaining sinus rhythm so far, I suspect it would be prudent to continue to observe him for least a period of time to see if his symptoms improve.  In addition, I've encouraged him to seek followup with his sleep therapist and consider another trial of CPAP therapy for treatment of sleep apnea.  We  will obtain pulmonary function tests and a followup CT angiogram of the chest to make sure that he does not have signs of persistent or recurrent pulmonary embolus as he notably developed left atrial thrombus while anticoagulated on Xarelto.  We will plan to see the patient back in 4 weeks and recheck his heart rhythm at that time. If he goes back into atrial fibrillation it might be renal to consider surgical maze procedure.  However, we would need to proceed with cardiac catheterization to rule out the possibility of significant coronary artery disease before proceeding with surgery.   I spent in excess of 90 minutes during the conduct of this office consultation and >50% of this time involved direct face-to-face encounter with the patient  for counseling and/or coordination of their care.   Valentina Gu. Roxy Manns, MD 04/26/2013 12:14 PM

## 2013-04-26 NOTE — Patient Instructions (Signed)
Resume CPAP therapy for sleep apnea

## 2013-04-27 ENCOUNTER — Encounter: Payer: Federal, State, Local not specified - PPO | Admitting: Thoracic Surgery (Cardiothoracic Vascular Surgery)

## 2013-05-03 ENCOUNTER — Ambulatory Visit (HOSPITAL_COMMUNITY)
Admission: RE | Admit: 2013-05-03 | Discharge: 2013-05-03 | Disposition: A | Discharge status: Home / Self Care | Payer: Federal, State, Local not specified - PPO | Source: Ambulatory Visit | Attending: Thoracic Surgery (Cardiothoracic Vascular Surgery) | Admitting: Thoracic Surgery (Cardiothoracic Vascular Surgery)

## 2013-05-03 ENCOUNTER — Encounter (HOSPITAL_COMMUNITY): Payer: Self-pay

## 2013-05-03 DIAGNOSIS — R0609 Other forms of dyspnea: Secondary | ICD-10-CM | POA: Insufficient documentation

## 2013-05-03 DIAGNOSIS — I2699 Other pulmonary embolism without acute cor pulmonale: Secondary | ICD-10-CM

## 2013-05-03 DIAGNOSIS — I472 Ventricular tachycardia: Secondary | ICD-10-CM

## 2013-05-03 DIAGNOSIS — R0989 Other specified symptoms and signs involving the circulatory and respiratory systems: Principal | ICD-10-CM | POA: Insufficient documentation

## 2013-05-03 DIAGNOSIS — I4729 Other ventricular tachycardia: Secondary | ICD-10-CM

## 2013-05-03 MED ORDER — IOHEXOL 350 MG/ML SOLN
100.0000 mL | Freq: Once | INTRAVENOUS | Status: AC | PRN
  Administered 2013-05-03: 100 mL via INTRAVENOUS

## 2013-05-03 MED ORDER — ALBUTEROL SULFATE (2.5 MG/3ML) 0.083% IN NEBU
2.5000 mg | INHALATION_SOLUTION | Freq: Once | RESPIRATORY_TRACT | Status: AC
  Administered 2013-05-03: 2.5 mg via RESPIRATORY_TRACT
  Filled 2013-05-03: qty 3

## 2013-05-04 LAB — PULMONARY FUNCTION TEST
DL/VA % pred: 83 %
DL/VA: 4.1 ml/min/mmHg/L
DLCO cor % pred: 85 %
DLCO cor: 34.43 ml/min/mmHg
DLCO unc % pred: 85 %
DLCO unc: 34.53 ml/min/mmHg
FEF 25-75 Post: 3.01 L/sec
FEF 25-75 Pre: 2.08 L/sec
FEF2575-%Change-Post: 44 %
FEF2575-%Pred-Post: 90 %
FEF2575-%Pred-Pre: 62 %
FEV1-%Change-Post: 11 %
FEV1-%Pred-Post: 88 %
FEV1-%Pred-Pre: 79 %
FEV1-Post: 3.76 L
FEV1-Pre: 3.37 L
FEV1FVC-%Change-Post: 5 %
FEV1FVC-%Pred-Pre: 87 %
FEV6-%Change-Post: 9 %
FEV6-%Pred-Post: 99 %
FEV6-%Pred-Pre: 90 %
FEV6-Post: 5.36 L
FEV6-Pre: 4.88 L
FEV6FVC-%Change-Post: 3 %
FEV6FVC-%Pred-Post: 104 %
FEV6FVC-%Pred-Pre: 100 %
FVC-%Change-Post: 6 %
FVC-%Pred-Post: 95 %
FVC-%Pred-Pre: 90 %
FVC-Post: 5.42 L
FVC-Pre: 5.11 L
Post FEV1/FVC ratio: 69 %
Post FEV6/FVC ratio: 99 %
Pre FEV1/FVC ratio: 66 %
Pre FEV6/FVC Ratio: 95 %
RV % pred: 160 %
RV: 4.27 L
TLC % pred: 122 %
TLC: 10.07 L

## 2013-05-09 ENCOUNTER — Ambulatory Visit (INDEPENDENT_AMBULATORY_CARE_PROVIDER_SITE_OTHER): Payer: Federal, State, Local not specified - PPO

## 2013-05-09 DIAGNOSIS — Z5181 Encounter for therapeutic drug level monitoring: Secondary | ICD-10-CM

## 2013-05-09 DIAGNOSIS — I749 Embolism and thrombosis of unspecified artery: Secondary | ICD-10-CM

## 2013-05-09 DIAGNOSIS — I4891 Unspecified atrial fibrillation: Secondary | ICD-10-CM

## 2013-05-09 LAB — POCT INR: INR: 2.6

## 2013-05-22 ENCOUNTER — Other Ambulatory Visit: Payer: Self-pay | Admitting: Internal Medicine

## 2013-06-02 ENCOUNTER — Ambulatory Visit (INDEPENDENT_AMBULATORY_CARE_PROVIDER_SITE_OTHER): Payer: Federal, State, Local not specified - PPO | Admitting: *Deleted

## 2013-06-02 DIAGNOSIS — I749 Embolism and thrombosis of unspecified artery: Secondary | ICD-10-CM

## 2013-06-02 DIAGNOSIS — Z5181 Encounter for therapeutic drug level monitoring: Secondary | ICD-10-CM

## 2013-06-02 DIAGNOSIS — I4891 Unspecified atrial fibrillation: Secondary | ICD-10-CM

## 2013-06-02 LAB — POCT INR: INR: 3.8

## 2013-06-05 ENCOUNTER — Ambulatory Visit (INDEPENDENT_AMBULATORY_CARE_PROVIDER_SITE_OTHER): Payer: Medicare Other | Admitting: Thoracic Surgery (Cardiothoracic Vascular Surgery)

## 2013-06-05 ENCOUNTER — Encounter: Payer: Self-pay | Admitting: Thoracic Surgery (Cardiothoracic Vascular Surgery)

## 2013-06-05 VITALS — BP 120/78 | HR 52 | Resp 20 | Ht 76.0 in | Wt 284.0 lb

## 2013-06-05 DIAGNOSIS — I059 Rheumatic mitral valve disease, unspecified: Secondary | ICD-10-CM | POA: Diagnosis not present

## 2013-06-05 DIAGNOSIS — I2699 Other pulmonary embolism without acute cor pulmonale: Secondary | ICD-10-CM | POA: Diagnosis not present

## 2013-06-05 DIAGNOSIS — I340 Nonrheumatic mitral (valve) insufficiency: Secondary | ICD-10-CM

## 2013-06-05 DIAGNOSIS — I4891 Unspecified atrial fibrillation: Secondary | ICD-10-CM | POA: Diagnosis not present

## 2013-06-05 DIAGNOSIS — I4819 Other persistent atrial fibrillation: Secondary | ICD-10-CM

## 2013-06-05 NOTE — Patient Instructions (Signed)
Schedule an appointment with Dr Rayann Heman as soon as practical to discuss adjusting medications

## 2013-06-05 NOTE — Progress Notes (Signed)
[address]       [place] [address]             [phone]     CARDIOTHORACIC SURGERY OFFICE NOTE  Referring Provider is Thompson Grayer, MD PCP is Nilda Simmer, MD   HPI:  Patient returns for followup of recurrent persistent atrial fibrillation. He was originally seen in consultation on 04/26/2013. At that time he had just previously undergone DC cardioversion while on amiodarone therapy and he was maintaining sinus rhythm.  Since then he has undergone evaluation for treatment of sleep apnea, and he was just recently started on Neurontin. He apparently has been scheduled for a sleep test in the near future. He has not yet been seen in followup by Dr. Rayann Heman. He denies any symptoms of tachypalpitations or chest discomfort, although he notes that he did not have symptoms of palpitations while he was in atrial fibrillation. His primary complaint is that of significant exertional shortness of breath and fatigue. He states that he sometimes gets tired and short of breath with relatively mild activity. He denies any resting shortness of breath, PND, orthopnea, or lower extremity edema. He states that his symptoms have essentially not improved at all over the past 6 weeks, but on the other hand it has not gotten worse.   Current Outpatient Prescriptions  Medication Sig Dispense Refill  . amiodarone (PACERONE) 200 MG tablet Take 1 tablet (200 mg total) by mouth daily.  30 tablet  3  . diazepam (VALIUM) 5 MG tablet Take 1 tablet by mouth at bedtime.      Marland Kitchen diltiazem (CARDIZEM CD) 180 MG 24 hr capsule Take 1 capsule by mouth daily.      Marland Kitchen escitalopram (LEXAPRO) 20 MG tablet Take 20 mg by mouth daily.      Marland Kitchen gabapentin (NEURONTIN) 800 MG tablet Take 1/2 to 1 tablet at dinner and take 1 tablet at bedtime      . metoprolol (LOPRESSOR) 100 MG tablet Take 50 mg by mouth 2 (two) times daily.       . Naproxen Sodium (ALEVE) 220 MG CAPS Take 1 capsule by mouth as needed.      .  tamsulosin (FLOMAX) 0.4 MG CAPS capsule Take 2 capsules by mouth daily.      Marland Kitchen warfarin (COUMADIN) 5 MG tablet 1/2 tablet daily except 1 tablet on Sunday, Tuesday and  Thursday or as directed by coumadin clinic  30 tablet  3   No current facility-administered medications for this visit.      Physical Exam:   BP 120/78  Pulse 52  Resp 20  Ht 6\' 4"  (1.93 m)  Wt 284 lb (128.822 kg)  BMI 34.58 kg/m2  SpO2 94%  General:  Well-appearing  Chest:   Clear to auscultation with a few expiratory wheezes  CV:   Slow heart rate with regular rhythm, no murmur  Incisions:  n/a  Abdomen:  soft  Extremities:  warm  Diagnostic Tests:  2 channel telemetry rhythm strip demonstrates sinus bradycardia   Pulmonary Function Tests  Baseline      Post-bronchodilator  FVC  5.11 L  (90% predicted) FVC  5.42 L  (95% predicted) FEV1  3.37 L  (79% predicted) FEV1  3.76 L  (88% predicted) FEF25-75 2.08 L  (62% predicted) FEF25-75 3.01 L  (90% predicted)  TLC  10.07 L  (122% predicted) RV  4.27 L  (160% predicted) DLCO  85% predicted    CT  ANGIOGRAPHY CHEST WITH CONTRAST  TECHNIQUE:  Multidetector CT imaging of the chest was performed using the  standard protocol during bolus administration of intravenous  contrast. Multiplanar CT image reconstructions and MIPs were  obtained to evaluate the vascular anatomy.  CONTRAST: 166mL OMNIPAQUE IOHEXOL 350 MG/ML SOLN  COMPARISON: CT HEART MORPH/PULM VEIN W/CM & W/O CA SCORE dated  02/24/2013; CT ANGIO CHEST W/CM &/OR WO/CM dated 03/17/2012  FINDINGS:  Vascular Findings:  There is adequate opacification of the pulmonary arterial system  with the main pulmonary artery measuring 280 Hounsfield units. There  are no discrete filling defects within the pulmonary arterial tree  to suggest pulmonary embolism. Borderline enlarged caliber the main  pulmonary artery, measuring 33 mm in diameter.  Cardiomegaly. No pericardial effusion of though there is a minimal    amount of fluid noted within the pericardial recess, presumably  physiologic. No definite coronary artery calcifications.  There is grossly unchanged fusiform aneurysmal dilatation of the  ascending thoracic aorta measuring approximately 48 mm in greatest  oblique axial dimension (axial image 48, series 4) and approximately  46 mm in greatest oblique coronal dimension (coronal image 54,  series 10). The thoracic aorta remains ectatic through the level of  the aortic arch with the proximal descending thoracic aorta  measuring approximately 39 mm in greatest oblique sagittal dimension  (image 21, series 11). The remainder of the descending thoracic  aorta remains tortuous though tapers to a normal caliber at the  level of the diaphragmatic hiatus where it measures approximately 34  mm (image 10, series 7). Scattered minimal atherosclerotic plaque  within the thoracic aorta. No definite thoracic aortic dissection or  periaortic stranding.  Conventional configuration of the aortic arch. The branch vessels of  the aortic arch widely patent throughout their imaged course.  Review of the MIP images confirms the above findings.  ----------------------------------------------------------------------------------  Nonvascular Findings:  There is minimal grossly symmetric dependent and subpleural  ground-glass atelectasis. No discrete focal airspace opacities. No  pleural effusion or pneumothorax. There is chronic mild diffuse with  though lower lobe predominant bronchial wall thickening. The left  lower lobe (image 47, series 2) and right upper lobe bronchi (image  42, series 6) are both focally narrowed proximally, though the  central pulmonary airways remain otherwise widely patent.  There is an indeterminate approximately 4 mm noncalcified nodule  within the right middle lobe (image 51, series 6), unchanged since  the 03/2012 examination. The punctate (approximately 3 mm)  subpleural nodule  within the inferior segment of the lingula (image  64, series 6) is also grossly unchanged. Unchanged punctate (3 mm)  granuloma within the right upper lobe (image 37, series 6). No new  pulmonary nodules.  Limited arterial phase evaluation of the upper abdomen is normal.  Old right mid clavicular fracture with persistent deformity. No  definite acute or aggressive osseus abnormalities. Mild ED within  the imaged caudal aspect of the cervical spine. Normal appearance of  the thyroid gland.  IMPRESSION:  1. No evidence of pulmonary embolism.  2. Cardiomegaly with enlarged caliber of the main pulmonary artery,  nonspecific though could be seen in the setting of pulmonary  arterial hypertension. Further evaluation with cardiac echo could be  performed as clinically indicated.  3. Grossly unchanged fusiform aneurysmal dilatation of the ascending  and proximal descending thoracic aorta, with the ascending thoracic  aorta measuring approximately 48 mm in diameter, unchanged since the  03/2011/2014 examination. No evidence of thoracic aortic dissection  or periaortic stranding.  4. Chronic mild diffuse bronchial wall thickening, nonspecific  though could be seen in the setting of bronchitis.  5. Grossly unchanged scattered bilateral subcentimeter pulmonary  nodules, the largest of which within the right middle lobe measuring  approximately 4 mm. This examination documents greater than 1 year  of stability. A follow-up chest CT in 1 year would ensure 2 years of  stability and thus a benign etiology.  Electronically Signed  By: Sandi Mariscal M.D.  On: 05/03/2013 11:52     Impression:  Patient is maintaining sinus rhythm since he underwent cardioversion this past April. His underlying heart rate is quite low, and it may be that his exertional shortness of breath and fatigue could be related to medical therapy with beta blocker, calcium channel blocker, and amiodarone.     Plan:  I've  discussed options with the patient in the office today. This point I would favor holding off on any plans for definitive surgical treatment of atrial fibrillation. It might be reasonable to consider trying to cut back on his dose of metoprolol and/or diltiazem. We will defer any subsequent decisions regarding his medical treatment to Dr. Rayann Heman and colleagues. His symptoms do not improve with further adjustments in his medical therapy, I would favor either proceeding with cardiopulmonary stress test and/or diagnostic cardiac catheterization.  If he develops recurrent atrial fibrillation it might be reasonable to consider Maze procedure. We'll plan to see him back in 12 weeks to see how he is getting along.  I spent in excess of 20 minutes during the conduct of this office consultation and >50% of this time involved direct face-to-face encounter with the patient for counseling and/or coordination of their care.   Valentina Gu. Roxy Manns, MD 06/05/2013 1:35 PM

## 2013-06-12 ENCOUNTER — Encounter (INDEPENDENT_AMBULATORY_CARE_PROVIDER_SITE_OTHER): Payer: Self-pay | Admitting: Surgery

## 2013-06-15 ENCOUNTER — Ambulatory Visit (INDEPENDENT_AMBULATORY_CARE_PROVIDER_SITE_OTHER): Payer: Medicare Other | Admitting: Cardiology

## 2013-06-15 ENCOUNTER — Encounter: Payer: Self-pay | Admitting: Cardiology

## 2013-06-15 VITALS — BP 122/86 | HR 37 | Wt 275.0 lb

## 2013-06-15 DIAGNOSIS — R001 Bradycardia, unspecified: Secondary | ICD-10-CM

## 2013-06-15 DIAGNOSIS — I4819 Other persistent atrial fibrillation: Secondary | ICD-10-CM

## 2013-06-15 DIAGNOSIS — R0989 Other specified symptoms and signs involving the circulatory and respiratory systems: Secondary | ICD-10-CM

## 2013-06-15 DIAGNOSIS — I4891 Unspecified atrial fibrillation: Secondary | ICD-10-CM

## 2013-06-15 DIAGNOSIS — G4733 Obstructive sleep apnea (adult) (pediatric): Secondary | ICD-10-CM

## 2013-06-15 DIAGNOSIS — R42 Dizziness and giddiness: Secondary | ICD-10-CM

## 2013-06-15 DIAGNOSIS — I498 Other specified cardiac arrhythmias: Secondary | ICD-10-CM

## 2013-06-15 DIAGNOSIS — R0609 Other forms of dyspnea: Secondary | ICD-10-CM

## 2013-06-15 DIAGNOSIS — R0600 Dyspnea, unspecified: Secondary | ICD-10-CM

## 2013-06-15 MED ORDER — METOPROLOL TARTRATE 25 MG PO TABS: 25.0000 mg | ORAL_TABLET | Freq: Two times a day (BID) | ORAL | Status: DC

## 2013-06-15 NOTE — Progress Notes (Signed)
ELECTROPHYSIOLOGY OFFICE NOTE   Patient ID: Brandon [name] MRN: [id number], DOB/AGE: 1950-07-11   Date of Visit: 06/15/2013  Primary Physician: Nilda Simmer, MD Primary Cardiologist: Dr. Ricky Ala  Electrophysiologist: Dr. Thompson Grayer  Reason for visit: EP follow-up  History of Present Illness  Brandon [name] is a 63 year old man with persistent AFib, OSA, HTN, prior pulmonary embolism in March 2014. He presents today for EP follow-up, scheduled by Dr. Roxy [name] to discuss medications and bradycardia.   He was initially diagnosed with atrial fibrillation in March 2014. He was found to have bilateral pulmonary emboli at that time. He was placed on Xarelto and underwent cardioversion. He maintained SR until Jan 2015. He had been off of Xarelto for several months but this was restarted in Jan 2015. He was placed on amiodarone but did not achieve SR. He saw Dr. Elonda [name] in [facility] with plans for balloon cryoablation. TEE demonstrated large mobile thrombus in the left atrial appendage; therefore, balloon cryoablation was not done. He then saw Dr. Rayann [name] March 2015. Rhythm control was felt to be the best strategy. It was determined that he failed Xarelto given the presence of a left atrial appendage thrombus. This was changed to Coumadin and he was bridged with Lovenox. Recommendation was to pursue transesophageal echocardiogram after 4 weeks of anticoagulation with therapeutic INRs. If the left atrial appendage thrombus resolved, he could undergo cardioversion with further plans to pursue surgical MAZE with left atrial appendage amputation. Therefore on 04/24/2013 he underwent TEE-guided DCCV. There was no LA/LAA thrombus and he was successfully cardioverted to SR. He was subsequently referred to Dr. Roxy [name] for consideration for surgical MAZE. Per Dr. Guy [name] note - "Patient is maintaining sinus rhythm since he underwent cardioversion this past April. His underlying heart rate is quite low, and it may be that his  exertional shortness of breath and fatigue could be related to medical therapy with beta blocker, calcium channel blocker, and amiodarone. I've discussed options with the patient in the office today. This point I would favor holding off on any plans for definitive surgical treatment of atrial fibrillation. It might be reasonable to consider trying to cut back on his dose of metoprolol and/or diltiazem. We will defer any subsequent decisions regarding his medical treatment to Dr. Rayann [name] and colleagues. His symptoms do not improve with further adjustments in his medical therapy, I would favor either proceeding with cardiopulmonary stress test and/or diagnostic cardiac catheterization." He is scheduled for follow-up with Dr. Roxy [name] in 3 months.  Today he presents for electrophysiology followup. He reports symptoms as listed below: Decreased mentation "slowness" and occasional slurred / delayed speech  DOE and dizziness Occurring even prior to most recent DCCV No syncope or falls No visual changes, headaches, dyskinesia or ataxia No CP No SOB at rest No LE swelling, orthopnea or PND  Reports compliance with his medications, including Eliquis Recently started gabapentin for restless leg syndrome  Past Medical History Past Medical History  Diagnosis Date  . Arthritis   . Sciatica   . Anxiety   . Hypertension   . Osteoporosis   . Sleep apnea     noncompliant with CPAP  . Cataract   . Asthma     Patient and wife both dinies patient has or had asthma  . Enlarged prostate   . Left atrial enlargement   . Persistent atrial fibrillation   . Obesity   . Bilateral pulmonary embolism 3/14    admitted to [facility],  treated with  Xarelto  . Chronic pain   . Thrombus of left atrial appendage 03/09/2013  . Mitral regurgitation 04/24/2013    Mild by TEE    Past Surgical History Past Surgical History  Procedure Laterality Date  . Knee arthroscopy      left  . Colonoscopy w/ polypectomy    . Rotator  cuff repair      right  . Tonsillectomy    . Cataract extraction    . Hernia repair  07/06/2011  . Replacement total knee    . Tee without cardioversion N/A 03/21/2012    Procedure: TRANSESOPHAGEAL ECHOCARDIOGRAM (TEE);  Surgeon: Brandon Riddle, MD;  Location: [place];  Service: Cardiovascular;  Laterality: N/A;  . Cardioversion N/A 03/21/2012    Procedure: CARDIOVERSION;  Surgeon: Brandon Riddle, MD;  Location: [facility] ENDOSCOPY;  Service: Cardiovascular;  Laterality: N/A;  . Tee without cardioversion N/A 04/24/2013    Procedure: TRANSESOPHAGEAL ECHOCARDIOGRAM (TEE);  Surgeon: Brandon Spark, MD;  Location: [place];  Service: Cardiovascular;  Laterality: N/A;  . Cardioversion N/A 04/24/2013    Procedure: CARDIOVERSION;  Surgeon: Brandon Spark, MD;  Location: [facility] ENDOSCOPY;  Service: Cardiovascular;  Laterality: N/A;    Allergies/Intolerances Allergies  Allergen Reactions  . Ace Inhibitors Other (See Comments)    COUGH    Current Home Medications Current Outpatient Prescriptions  Medication Sig Dispense Refill  . amiodarone (PACERONE) 200 MG tablet Take 1 tablet (200 mg total) by mouth daily.  30 tablet  3  . diazepam (VALIUM) 5 MG tablet Take 1 tablet by mouth at bedtime.      Marland Kitchen escitalopram (LEXAPRO) 20 MG tablet Take 20 mg by mouth daily.      Marland Kitchen gabapentin (NEURONTIN) 800 MG tablet Take 1/2 to 1 tablet at dinner and take 1 tablet at bedtime      . metoprolol (LOPRESSOR) 100 MG tablet Take 50 mg by mouth 2 (two) times daily.       . Naproxen Sodium (ALEVE) 220 MG CAPS Take 1 capsule by mouth as needed.      . tamsulosin (FLOMAX) 0.4 MG CAPS capsule Take 2 capsules by mouth daily.      Marland Kitchen warfarin (COUMADIN) 5 MG tablet 1/2 tablet daily except 1 tablet on Sunday, Tuesday and  Thursday or as directed by coumadin clinic  30 tablet  3   No current facility-administered medications for this visit.    Social History History   Social History  . Marital Status: Married     Spouse Name: N/A    Number of Children: N/A  . Years of Education: N/A   Occupational History  . Not on file.   Social History Main Topics  . Smoking status: Never Smoker   . Smokeless tobacco: Never Used  . Alcohol Use: Yes     Comment: 1 beer per month  . Drug Use: No     Comment: negative hx for IV drug abuse  . Sexual Activity: Not on file   Other Topics Concern  . Not on file   Social History Narrative   Lives with wife in [place]   Retired Dietitian     Review of Systems General: No chills, fever, night sweats or weight changes Cardiovascular: No chest pain, dyspnea on exertion, edema, orthopnea, palpitations, paroxysmal nocturnal dyspnea Dermatological: No rash, lesions or masses Respiratory: No cough, dyspnea Urologic: No hematuria, dysuria Abdominal: No nausea, vomiting, diarrhea, bright red blood per rectum, melena, or hematemesis  Neurologic: No visual changes, weakness, changes in mental status All other systems reviewed and are otherwise negative except as noted above.  Physical Exam Vitals: Blood pressure 122/86, pulse 37, weight 275 lb (124.739 kg).  General: Well developed, well appearing 63 y.o. male in no acute distress. HEENT: Normocephalic, atraumatic. EOMs intact. Sclera nonicteric. Oropharynx clear.  Neck: Supple. No JVD. Lungs: Respirations regular and unlabored, CTA bilaterally. No wheezes, rales or rhonchi. Heart: Bradycardic, regular. S1, S2 present. No murmurs, rub, S3 or S4. Abdomen: Soft, non-distended.   Extremities: No clubbing, cyanosis or edema. PT/Radials 2+ and equal bilaterally. Psych: Normal affect. Neuro: Alert and oriented X 3. Moves all extremities spontaneously.   Diagnostics TEE-guided DCCV 04/24/2013 Study Conclusions - Left ventricle: No evidence of thrombus. - Mitral valve: Mild regurgitation. - Left atrium: The atrium was dilated. No evidence of thrombus in the atrial cavity or appendage. No evidence  of thrombus in the atrial cavity or appendage. The appendage was well visualized and moderately dilated. Emptying velocity was mildly reduced. - Recommendations: No evidence of thrombus in left atrium or left atrial appendage. The TEE was followed by a successful cardioversion. Impression: - No cardiac source of emboli was indentified. Successful cardioversion.  12-lead ECG today - marked sinus bradycardia at 37 bpm with first degree AV block; PR 280 QRS 118 QTc 411  Assessment Profound sinus bradycardia Atrial fibrillation s/p TEE-guided DCCV April 2015 Prior LA/LAA thrombus March 2015, recent TEE negative for thrombus April 2015 Prior bilateral PE March 2014 Untreated sleep apnea Restless leg syndrome and started gabapentin March 2015  Plan  Stop diltiazem Decrease metoprolol to 25 mg twice daily Continue low dose amiodarone as he is maintaining SR Start CPAP ASAP Also on differential is subacute CVA or medication side effect of gabapentin Also if symptoms do not improve off rate slowing medications / with improved heart rate, consider cardiac catheterization for definitive evaluation of coronary anatomy Return for follow-up visit with RN and 12-lead ECG in one week Return to see Dr Brandon [name] in 3-4 weeks, scheduled 07/17/2013  Signed, Ileene Hutchinson, PA-C 06/15/2013, 1:53 PM

## 2013-06-15 NOTE — Patient Instructions (Addendum)
.  Your physician has recommended you make the following change in your medication:  1. DECREASE METOPROLOL 25 MG 1 TAB TWICE DAILY 2. STOP DILTIAZEM  Your physician recommends that you schedule a follow-up appointment in: [place] 06/22/13 AT 11:00AM  Your physician recommends that you schedule a follow-up appointment in: [place] DR.ALLRED

## 2013-06-16 ENCOUNTER — Ambulatory Visit (INDEPENDENT_AMBULATORY_CARE_PROVIDER_SITE_OTHER): Payer: Medicare Other | Admitting: *Deleted

## 2013-06-16 DIAGNOSIS — I4891 Unspecified atrial fibrillation: Secondary | ICD-10-CM

## 2013-06-16 DIAGNOSIS — I749 Embolism and thrombosis of unspecified artery: Secondary | ICD-10-CM

## 2013-06-16 DIAGNOSIS — Z5181 Encounter for therapeutic drug level monitoring: Secondary | ICD-10-CM

## 2013-06-16 LAB — POCT INR: INR: 2.1

## 2013-06-22 ENCOUNTER — Ambulatory Visit (INDEPENDENT_AMBULATORY_CARE_PROVIDER_SITE_OTHER): Payer: Medicare Other | Admitting: Cardiology

## 2013-06-22 VITALS — BP 132/82 | HR 49 | Ht 76.0 in | Wt 267.0 lb

## 2013-06-22 DIAGNOSIS — I498 Other specified cardiac arrhythmias: Secondary | ICD-10-CM

## 2013-06-22 DIAGNOSIS — R001 Bradycardia, unspecified: Secondary | ICD-10-CM

## 2013-06-22 NOTE — Progress Notes (Signed)
Pt came in today for follow up EKG after decreasing Metoprolol to 25 mg twice a day and discontinuing diltiazem d/t bradycardia.  His HR has improved from the last EKG from one a week ago.  He does report having symptoms of SOB and dizziness when up and moving around sometimes but they are not like before the medication changes when they occurred just sitting still.  Aware I will forward this information to [facility] for her review and he will be called with any needed changes.

## 2013-06-22 NOTE — Patient Instructions (Addendum)
The current medical regimen is effective;  continue present plan and medications.  Once [place] reviews your EKG you will be called with any further changes.  Follow up as scheduled.

## 2013-06-22 NOTE — Progress Notes (Signed)
I have reviewed his ECG which shows sinus bradycardia at 49 bpm, no ST-T wave abnormalities. His symptoms have improved with lower doses of rate controlling medications. Continue current medication regimen and keep follow-up appointment with Dr. Rayann Heman as scheduled. Thank you. Brandon [name]

## 2013-06-23 ENCOUNTER — Ambulatory Visit (INDEPENDENT_AMBULATORY_CARE_PROVIDER_SITE_OTHER): Payer: Medicare Other | Admitting: Surgery

## 2013-06-23 ENCOUNTER — Encounter (INDEPENDENT_AMBULATORY_CARE_PROVIDER_SITE_OTHER): Payer: Self-pay | Admitting: Surgery

## 2013-06-23 VITALS — BP 126/80 | HR 57 | Temp 98.0°F | Ht 76.0 in | Wt 269.0 lb

## 2013-06-23 DIAGNOSIS — R1031 Right lower quadrant pain: Secondary | ICD-10-CM | POA: Insufficient documentation

## 2013-06-23 DIAGNOSIS — R109 Unspecified abdominal pain: Secondary | ICD-10-CM

## 2013-06-23 NOTE — Progress Notes (Signed)
Patient ID: Brandon [name], male   DOB: 1950-06-12, 63 y.o.   MRN: [id number]  Chief Complaint  Patient presents with  . eval hernia    HPI Brandon [name] is a 63 y.o. male.  Referred by Dr. Scarlette Ar for right inguinal hernia HPI This is a 63 year old male who is status post laparoscopic repair of bilateral inguinal hernias In July of 2013. He also had a small umbilical hernia repaired. He had been doing fairly well. Recently he had a severe upper respiratory infection and had a lot of severe coughing. About 2 weeks ago he was coughing and felt some pain in his right groin. He subsequently developed some firmness in this area. This was treated with ice and has improved slightly. He continues to feel some discomfort in this area.  The patient has been having some problems with his persistent atrial fibrillation and has required cardioversion. He remains anticoagulated. He was being considered for a Maze procedure but that is on hold for now.   Past Medical History  Diagnosis Date  . Arthritis   . Sciatica   . Anxiety   . Hypertension   . Osteoporosis   . Sleep apnea     noncompliant with CPAP  . Cataract   . Asthma     Patient and wife both dinies patient has or had asthma  . Enlarged prostate   . Left atrial enlargement   . Persistent atrial fibrillation   . Obesity   . Bilateral pulmonary embolism 3/14    admitted to [facility],  treated with Xarelto  . Chronic pain   . Thrombus of left atrial appendage 03/09/2013  . Mitral regurgitation 04/24/2013    Mild by TEE    Past Surgical History  Procedure Laterality Date  . Knee arthroscopy      left  . Colonoscopy w/ polypectomy    . Rotator cuff repair      right  . Tonsillectomy    . Cataract extraction    . Hernia repair  07/06/2011  . Replacement total knee    . Tee without cardioversion N/A 03/21/2012    Procedure: TRANSESOPHAGEAL ECHOCARDIOGRAM (TEE);  Surgeon: Birdie Riddle, MD;  Location: [place];  Service:  Cardiovascular;  Laterality: N/A;  . Cardioversion N/A 03/21/2012    Procedure: CARDIOVERSION;  Surgeon: Birdie Riddle, MD;  Location[facility] ENDOSCOPY;  Service: Cardiovascular;  Laterality: N/A;  . Tee without cardioversion N/A 04/24/2013    Procedure: TRANSESOPHAGEAL ECHOCARDIOGRAM (TEE);  Surgeon: Dorothy Spark, MD;  Location: [place];  Service: Cardiovascular;  Laterality: N/A;  . Cardioversion N/A 04/24/2013    Procedure: CARDIOVERSION;  Surgeon: Dorothy Spark, MD;  Location: [facility] ENDOSCOPY;  Service: Cardiovascular;  Laterality: N/A;    Family History  Problem Relation Age of Onset  . Cancer Father     bone  . Cancer Paternal Grandmother     ovarian    Social History History  Substance Use Topics  . Smoking status: Never Smoker   . Smokeless tobacco: Never Used  . Alcohol Use: Yes     Comment: 1 beer per month    Allergies  Allergen Reactions  . Ace Inhibitors Other (See Comments)    COUGH    Current Outpatient Prescriptions  Medication Sig Dispense Refill  . amiodarone (PACERONE) 200 MG tablet Take 1 tablet (200 mg total) by mouth daily.  30 tablet  3  . diazepam (VALIUM) 5 MG tablet Take 1 tablet by mouth  at bedtime.      Marland Kitchen escitalopram (LEXAPRO) 20 MG tablet Take 20 mg by mouth daily.      Marland Kitchen gabapentin (NEURONTIN) 800 MG tablet Take 1/2 to 1 tablet at dinner and take 1 tablet at bedtime      . metoprolol tartrate (LOPRESSOR) 25 MG tablet Take 1 tablet (25 mg total) by mouth 2 (two) times daily.  180 tablet  3  . Naproxen Sodium (ALEVE) 220 MG CAPS Take 1 capsule by mouth as needed.      . tamsulosin (FLOMAX) 0.4 MG CAPS capsule Take 2 capsules by mouth daily.      Marland Kitchen warfarin (COUMADIN) 5 MG tablet 1/2 tablet daily except 1 tablet on Sunday, Tuesday and  Thursday or as directed by coumadin clinic  30 tablet  3   No current facility-administered medications for this visit.    Review of Systems Review of Systems  Constitutional: Negative for fever, chills  and unexpected weight change.  HENT: Negative for congestion, hearing loss, sore throat, trouble swallowing and voice change.   Eyes: Negative for visual disturbance.  Respiratory: Negative for cough and wheezing.   Cardiovascular: Negative for chest pain, palpitations and leg swelling.  Gastrointestinal: Negative for nausea, vomiting, abdominal pain, diarrhea, constipation, blood in stool, abdominal distention, anal bleeding and rectal pain.  Genitourinary: Negative for hematuria and difficulty urinating.  Musculoskeletal: Negative for arthralgias.  Skin: Negative for rash and wound.  Neurological: Negative for seizures, syncope, weakness and headaches.  Hematological: Negative for adenopathy. Does not bruise/bleed easily.  Psychiatric/Behavioral: Negative for confusion.    Blood pressure 126/80, pulse 57, temperature 98 F (36.7 C), height 6\' 4"  (1.93 m), weight 269 lb (122.018 kg).  Physical Exam Physical Exam WDWN in NAD HEENT:  EOMI, sclera anicteric Neck:  No masses, no thyromegaly Lungs:  CTA bilaterally; normal respiratory effort CV:  Regular rate and rhythm; no murmurs Abd:  +bowel sounds, soft, non-tender, no masses; healed umbilical incision GU:  Bilateral descended testes; no testicular masses; no obvious right inguinal hernia or left inguinal hernia; some tenderness at right external ring Ext:  Well-perfused; no edema Skin:  Warm, dry; no sign of jaundice  Data Reviewed none  Assessment    Right groin pain after bilateral inguinal hernias - possible muscle strain or tear with hematoma No obvious hernia on physical examination     Plan    CT pelvis to rule out recurrent hernia Rest, no straining or strenuous exercise Follow-up after CT scan       Brandon Brennen K. 06/23/2013, 9:44 AM

## 2013-06-27 ENCOUNTER — Ambulatory Visit
Admission: RE | Admit: 2013-06-27 | Discharge: 2013-06-27 | Disposition: A | Discharge status: Home / Self Care | Payer: Medicare Other | Source: Ambulatory Visit | Attending: Surgery | Admitting: Surgery

## 2013-06-27 DIAGNOSIS — R1031 Right lower quadrant pain: Secondary | ICD-10-CM

## 2013-06-27 MED ORDER — IOHEXOL 300 MG/ML  SOLN
125.0000 mL | Freq: Once | INTRAMUSCULAR | Status: AC | PRN
  Administered 2013-06-27: 125 mL via INTRAVENOUS

## 2013-07-06 ENCOUNTER — Ambulatory Visit (INDEPENDENT_AMBULATORY_CARE_PROVIDER_SITE_OTHER): Payer: Medicare Other | Admitting: *Deleted

## 2013-07-06 DIAGNOSIS — I4891 Unspecified atrial fibrillation: Secondary | ICD-10-CM

## 2013-07-06 DIAGNOSIS — Z5181 Encounter for therapeutic drug level monitoring: Secondary | ICD-10-CM

## 2013-07-06 DIAGNOSIS — I749 Embolism and thrombosis of unspecified artery: Secondary | ICD-10-CM

## 2013-07-06 LAB — POCT INR: INR: 2.2

## 2013-07-17 ENCOUNTER — Encounter (INDEPENDENT_AMBULATORY_CARE_PROVIDER_SITE_OTHER): Payer: Self-pay | Admitting: Surgery

## 2013-07-17 ENCOUNTER — Ambulatory Visit (INDEPENDENT_AMBULATORY_CARE_PROVIDER_SITE_OTHER): Payer: Medicare Other | Admitting: Surgery

## 2013-07-17 ENCOUNTER — Encounter: Payer: Self-pay | Admitting: Internal Medicine

## 2013-07-17 ENCOUNTER — Ambulatory Visit (INDEPENDENT_AMBULATORY_CARE_PROVIDER_SITE_OTHER): Payer: Medicare Other | Admitting: Internal Medicine

## 2013-07-17 ENCOUNTER — Encounter (INDEPENDENT_AMBULATORY_CARE_PROVIDER_SITE_OTHER): Payer: Self-pay | Admitting: General Surgery

## 2013-07-17 VITALS — BP 110/70 | HR 45 | Ht 76.0 in | Wt 264.0 lb

## 2013-07-17 VITALS — BP 142/84 | HR 81 | Temp 98.1°F | Ht 76.0 in | Wt 265.0 lb

## 2013-07-17 DIAGNOSIS — K4091 Unilateral inguinal hernia, without obstruction or gangrene, recurrent: Secondary | ICD-10-CM | POA: Insufficient documentation

## 2013-07-17 DIAGNOSIS — I4891 Unspecified atrial fibrillation: Secondary | ICD-10-CM

## 2013-07-17 DIAGNOSIS — I2699 Other pulmonary embolism without acute cor pulmonale: Secondary | ICD-10-CM

## 2013-07-17 DIAGNOSIS — G4733 Obstructive sleep apnea (adult) (pediatric): Secondary | ICD-10-CM

## 2013-07-17 DIAGNOSIS — I5189 Other ill-defined heart diseases: Secondary | ICD-10-CM

## 2013-07-17 DIAGNOSIS — Z01810 Encounter for preprocedural cardiovascular examination: Secondary | ICD-10-CM

## 2013-07-17 DIAGNOSIS — I513 Intracardiac thrombosis, not elsewhere classified: Secondary | ICD-10-CM

## 2013-07-17 HISTORY — DX: Unilateral inguinal hernia, without obstruction or gangrene, recurrent: K40.91

## 2013-07-17 NOTE — Addendum Note (Signed)
Addended by: Maia Petties on: 07/17/2013 10:34 AM   Modules accepted: Orders

## 2013-07-17 NOTE — Progress Notes (Signed)
PCP:  Nilda Simmer, MD  The patient presents today for routine electrophysiology followup.  Since last being seen in our clinic, he had TEE with DCCV and appears to be maintaining sinus rhythm on  Amiodarone 200 mg a day.  He  has deferred treatment of his sleep apnea, but now realizes that he has to have this treated for best outcome and is currently being fitted with a CPAP.  He does complain of fatigue which might be multifactorial due to untreated sleep apnea,  obesity and sedentary lifestyle. His EKG today shows sinus bradycardia at 44 beats per minute. He is now off of Diltiazem, taking 25 mg of metoprolol twice a day. He continues on warfarin. He would like to have hernia surgery and is asking for clearance today.  Does have a history of PE with his last being in February of this year.  Chads2vasc score is at least an 3 with history of 2  PE.  He is currently very uncomfortable with his inguinal hernia and this is interfering with his ability to exercise.   He has occasional nausea of unclear etiology.   Past Medical History  Diagnosis Date  . Arthritis   . Sciatica   . Anxiety   . Hypertension   . Osteoporosis   . OSA (obstructive sleep apnea)     noncompliant with CPAP  . Cataract   . Asthma     Patient and wife both dinies patient has or had asthma  . Enlarged prostate   . Left atrial enlargement   . Persistent atrial fibrillation   . Obesity   . Bilateral pulmonary embolism 3/14    admitted to [facility],  treated with Xarelto  . Chronic pain   . Thrombus of left atrial appendage 03/09/2013  . Mitral regurgitation 04/24/2013    Mild by TEE  . Fatigue   . Dyspnea on exertion   . Polydipsia   . Anxiety disorder   . Hepatitis C virus   . Benign neoplasm of colon   . Benign prostatic hyperplasia with urinary obstruction   . Chest pain   . Encounter for long-term (current) use of other medications   . Hyperlipemia   . Hypopotassemia   . Insomnia   . Major depressive  disorder, recurrent episode   . Osteoarthritis   . Periodic limb movement disorder   . SOB (shortness of breath)   . Thoracic or lumbosacral neuritis or radiculitis, unspecified   . Ventral hernia, unspecified, without mention of obstruction or gangrene     right abdominal wall   Past Surgical History  Procedure Laterality Date  . Knee arthroscopy      left  . Colonoscopy w/ polypectomy    . Rotator cuff repair      right  . Tonsillectomy    . Cataract extraction    . Hernia repair  07/06/2011  . Replacement total knee    . Tee without cardioversion N/A 03/21/2012    Procedure: TRANSESOPHAGEAL ECHOCARDIOGRAM (TEE);  Surgeon: Birdie Riddle, MD;  Location: [place];  Service: Cardiovascular;  Laterality: N/A;  . Cardioversion N/A 03/21/2012    Procedure: CARDIOVERSION;  Surgeon: Birdie Riddle, MD;  Location: [facility] ENDOSCOPY;  Service: Cardiovascular;  Laterality: N/A;  . Tee without cardioversion N/A 04/24/2013    Procedure: TRANSESOPHAGEAL ECHOCARDIOGRAM (TEE);  Surgeon: Dorothy Spark, MD;  Location: [place];  Service: Cardiovascular;  Laterality: N/A;  . Cardioversion N/A 04/24/2013    Procedure: CARDIOVERSION;  Surgeon: Houston Siren  Hazel Sams, MD;  Location: [facility] ENDOSCOPY;  Service: Cardiovascular;  Laterality: N/A;    Current Outpatient Prescriptions  Medication Sig Dispense Refill  . amiodarone (PACERONE) 200 MG tablet Take 1 tablet (200 mg total) by mouth daily.  30 tablet  3  . diazepam (VALIUM) 5 MG tablet Take 1 tablet by mouth at bedtime.      Marland Kitchen escitalopram (LEXAPRO) 20 MG tablet Take 20 mg by mouth daily.      Marland Kitchen gabapentin (NEURONTIN) 800 MG tablet Take 1/2 to 1 tablet at dinner and take 1 tablet at bedtime      . Naproxen Sodium (ALEVE) 220 MG CAPS Take 1 capsule by mouth as needed.      . tamsulosin (FLOMAX) 0.4 MG CAPS capsule Take 2 capsules by mouth daily.      Marland Kitchen warfarin (COUMADIN) 5 MG tablet 1/2 tablet daily except 1 tablet on Sunday, Tuesday and  Thursday or as  directed by coumadin clinic  30 tablet  3   No current facility-administered medications for this visit.    Allergies  Allergen Reactions  . Ace Inhibitors Other (See Comments)    COUGH    History   Social History  . Marital Status: Married    Spouse Name: N/A    Number of Children: N/A  . Years of Education: N/A   Occupational History  . Not on file.   Social History Main Topics  . Smoking status: Never Smoker   . Smokeless tobacco: Never Used  . Alcohol Use: Yes     Comment: 1 beer per month  . Drug Use: No     Comment: negative hx for IV drug abuse  . Sexual Activity: Not on file   Other Topics Concern  . Not on file   Social History Narrative   Lives with wife in [place]   Retired Dietitian    Family History  Problem Relation Age of Onset  . Cancer Father     bone  . Cancer Paternal Grandmother     ovarian  . CVA Other     Fam Hx of multiple myeloma  . Diabetes Other     Fam Hx of DM    ROS-  All systems are reviewed and are negative except as outlined in the HPI above  Physical Exam: Filed Vitals:   07/17/13 1145  BP: 110/70  Pulse: 45  Height: 6' 4"  (1.93 m)  Weight: 119.75 kg (264 lb)    GEN- The patient is well appearing, alert and oriented x 3 today. Head- normocephalic, atraumatic Eyes-  Sclera clear, conjunctiva pink Ears- hearing intact Oropharynx- clear Neck- supple, no JVP Lymph- no cervical lymphadenopathy Lungs- Clear to ausculation bilaterally, normal work of breathing Heart-  regular, slow rate and rhythm  GI- soft, NT, ND, + BS Extremities- no clubbing, cyanosis, or edema MS- no significant deformity or atrophy Skin- no rash or lesion Psych- euthymic mood, full affect Neuro- strength and sensation are intact  ekg today reveals  sinus bradycardia at 44 beats per minute. He is in and   Assessment and Plan:  1. Persistent atrial fibrillation Currently maintaining sinus rhythm on amiodarone s/p  recent cardioversion.  He has bradycardia.  stop metoprolol today. If he continues to maintain sinus rhythm after 3 months consideration for decreasing amiodarone to 100 mg a day. Amiodarone level today due to episodic nausea, as well as liver TSH and T4 today.  2. Preoperative clearance for pending  inguinal hernia surgery Lexi Myoview to be obtained to further understand risk proceeding with surgery, date for surgery and a tentatively has been  planned for July 21. If low risk, patient can proceed to surgery at acceptable  risk. He will  require bridge therapy for the surgery with lovenox and will be referred to the anticoagulation clinic for bridging.    3. Sleep apnea/ fatigue.  Continue with plans for treatment of sleep apnea . Patient understands correlation of atrial fibrillation to recurrent atrial fibrillation  4. Continue warfarin long term due to history of PE on Xarelto. Chads2vas score of 2 age hypertension and with history of recurrent pulmonary emboli and previously documented LA appendage thrombus even on xarelto--> switched to coumadin and thrombus resolved.  5. H/O LAA thrombus. Last TEE 04/24/13 did not show any evidence of thrombus in the left atrium or left atrial appendage. Continue warfarin.  6. HTN Stable No change required today   7. Obesity Weight loss is advised.  8.  Followup in 3 months in the afib clinic.

## 2013-07-17 NOTE — Patient Instructions (Signed)
Your physician recommends that you schedule a follow-up appointment in: 3 months with Roderic Palau, NP   Your physician has requested that you have a lexiscan myoview. For further information please visit [email or web address]. Please follow instruction sheet, as given.  Your physician recommends that you return for lab work Amiodarone level/TSH/Free T4  You have been referred to Coumadin Clinic for Lovenox bridge  Your physician has recommended you make the following change in your medication:  1) Stop Metoprolol

## 2013-07-17 NOTE — Progress Notes (Signed)
Patient ID: Brandon [name], male   DOB: 1950/09/12, 63 y.o.   MRN: [id number] HPI  Brandon [name] is a 63 y.o. male. Referred by Dr. Scarlette Ar for right inguinal hernia  HPI  This is a 63 year old male who is status post laparoscopic repair of bilateral inguinal hernias In July of 2013. He also had a small umbilical hernia repaired. He had been doing fairly well. Recently he had a severe upper respiratory infection and had a lot of severe coughing. About 2 weeks ago he was coughing and felt some pain in his right groin. He subsequently developed some firmness in this area. This was treated with ice and has improved slightly. He continues to feel some discomfort in this area.   CT scan showed a fat containing right inguinal hernia; some fat in left inguinal canal, but no symptoms  He has become quite symptomatic since the last visit  The patient has been having some problems with his persistent atrial fibrillation and has required cardioversion. He remains anticoagulated. He was being considered for a Maze procedure but that is on hold for now.  Past Medical History   Diagnosis  Date   .  Arthritis    .  Sciatica    .  Anxiety    .  Hypertension    .  Osteoporosis    .  Sleep apnea      noncompliant with CPAP   .  Cataract    .  Asthma      Patient and wife both dinies patient has or had asthma   .  Enlarged prostate    .  Left atrial enlargement    .  Persistent atrial fibrillation    .  Obesity    .  Bilateral pulmonary embolism  3/14     admitted to [facility], treated with Xarelto   .  Chronic pain    .  Thrombus of left atrial appendage  03/09/2013   .  Mitral regurgitation  04/24/2013     Mild by TEE    Past Surgical History   Procedure  Laterality  Date   .  Knee arthroscopy       left   .  Colonoscopy w/ polypectomy     .  Rotator cuff repair       right   .  Tonsillectomy     .  Cataract extraction     .  Hernia repair   07/06/2011   .  Replacement total knee     .  Tee  without cardioversion  N/A  03/21/2012     Procedure: TRANSESOPHAGEAL ECHOCARDIOGRAM (TEE); Surgeon: Birdie Riddle, MD; Location: [place]; Service: Cardiovascular; Laterality: N/A;   .  Cardioversion  N/A  03/21/2012     Procedure: CARDIOVERSION; Surgeon: Birdie Riddle, MD; Location: [facility] ENDOSCOPY; Service: Cardiovascular; Laterality: N/A;   .  Tee without cardioversion  N/A  04/24/2013     Procedure: TRANSESOPHAGEAL ECHOCARDIOGRAM (TEE); Surgeon: Dorothy Spark, MD; Location: [place]; Service: Cardiovascular; Laterality: N/A;   .  Cardioversion  N/A  04/24/2013     Procedure: CARDIOVERSION; Surgeon: Dorothy Spark, MD; Location: [facility] ENDOSCOPY; Service: Cardiovascular; Laterality: N/A;    Family History   Problem  Relation  Age of Onset   .  Cancer  Father      bone   .  Cancer  Paternal Grandmother      ovarian   Social History  History  Substance Use Topics   .  Smoking status:  Never Smoker   .  Smokeless tobacco:  Never Used   .  Alcohol Use:  Yes      Comment: 1 beer per month    Allergies   Allergen  Reactions   .  Ace Inhibitors  Other (See Comments)     COUGH    Current Outpatient Prescriptions   Medication  Sig  Dispense  Refill   .  amiodarone (PACERONE) 200 MG tablet  Take 1 tablet (200 mg total) by mouth daily.  30 tablet  3   .  diazepam (VALIUM) 5 MG tablet  Take 1 tablet by mouth at bedtime.     Marland Kitchen  escitalopram (LEXAPRO) 20 MG tablet  Take 20 mg by mouth daily.     Marland Kitchen  gabapentin (NEURONTIN) 800 MG tablet  Take 1/2 to 1 tablet at dinner and take 1 tablet at bedtime     .  metoprolol tartrate (LOPRESSOR) 25 MG tablet  Take 1 tablet (25 mg total) by mouth 2 (two) times daily.  180 tablet  3   .  Naproxen Sodium (ALEVE) 220 MG CAPS  Take 1 capsule by mouth as needed.     .  tamsulosin (FLOMAX) 0.4 MG CAPS capsule  Take 2 capsules by mouth daily.     Marland Kitchen  warfarin (COUMADIN) 5 MG tablet  1/2 tablet daily except 1 tablet on Sunday, Tuesday and Thursday or as  directed by coumadin clinic  30 tablet  3    No current facility-administered medications for this visit.   Review of Systems  Review of Systems  Constitutional: Negative for fever, chills and unexpected weight change.  HENT: Negative for congestion, hearing loss, sore throat, trouble swallowing and voice change.  Eyes: Negative for visual disturbance.  Respiratory: Negative for cough and wheezing.  Cardiovascular: Negative for chest pain, palpitations and leg swelling.  Gastrointestinal: Negative for nausea, vomiting, abdominal pain, diarrhea, constipation, blood in stool, abdominal distention, anal bleeding and rectal pain.  Genitourinary: Negative for hematuria and difficulty urinating.  Musculoskeletal: Negative for arthralgias.  Skin: Negative for rash and wound.  Neurological: Negative for seizures, syncope, weakness and headaches.  Hematological: Negative for adenopathy. Does not bruise/bleed easily.  Psychiatric/Behavioral: Negative for confusion.  Blood pressure 126/80, pulse 57, temperature 98 F (36.7 C), height 6\' 4"  (1.93 m), weight 269 lb (122.018 kg).  Physical Exam  Physical Exam  WDWN in NAD  HEENT: EOMI, sclera anicteric  Neck: No masses, no thyromegaly  Lungs: CTA bilaterally; normal respiratory effort  CV: Regular rate and rhythm; no murmurs  Abd: +bowel sounds, soft, non-tender, no masses; healed umbilical incision  GU: Bilateral descended testes; no testicular masses;obvious right inguinal hernia - reducible; no sign of left inguinal hernia Ext: Well-perfused; no edema  Skin: Warm, dry; no sign of jaundice  Data Reviewed  Ct Pelvis W Contrast  06/27/2013   CLINICAL DATA:  63 year old male with right groin pain after severe coughing 6 weeks ago. History of bilateral inguinal hernia repair several years ago. Nausea. Initial encounter.  BUN and creatinine were obtained on site at [facility] at [address]Results: BUN 15 mg/dL, Creatinine 1.1 mg/dL.   EXAM: CT PELVIS WITH CONTRAST  TECHNIQUE: Multidetector CT imaging of the pelvis was performed using the standard protocol following the bolus administration of intravenous contrast.  CONTRAST:  189mL OMNIPAQUE IOHEXOL 300 MG/ML  SOLN  COMPARISON:  CT Abdomen and Pelvis 02/08/2010.  FINDINGS: Bilateral fact containing inguinal hernias persist, greater on the right. That on the left does appear decreased since 2012. Increased mesenteric component of fat suspected on the right. No herniated bowel. No pelvic or lower abdominal free fluid.  Negative distal colon except for sigmoid redundancy. There is diverticulosis in the distal descending colon. Nondilated distal small bowel loops containing contrast. Negative visualized cecum. Visualized major arterial structures in the lower abdomen and pelvis are patent with chronic dolichoectasia. No lymphadenopathy. No acute osseous abnormality identified. Chronic deformity proximal left femur.  IMPRESSION: Right greater than left fat containing inguinal hernias. That on the right appears increased compared to 2012, while that on the left appears decreased. No herniated bowel or free fluid.   Electronically Signed   By: Lars Pinks M.D.   On: 06/27/2013 16:15     Assessment  Right inguinal hernia repair with mesh - recurrent, reducible  Plan:  Right inguinal hernia repair with mesh.  The surgical procedure has been discussed with the patient.  Potential risks, benefits, alternative treatments, and expected outcomes have been explained.  All of the patient's questions at this time have been answered.  The likelihood of reaching the patient's treatment goal is good.  The patient understand the proposed surgical procedure and wishes to proceed.

## 2013-07-18 ENCOUNTER — Ambulatory Visit (HOSPITAL_COMMUNITY): Payer: Medicare Other | Attending: Cardiology | Admitting: Radiology

## 2013-07-18 VITALS — BP 154/100 | Ht 76.0 in | Wt 265.0 lb

## 2013-07-18 DIAGNOSIS — R112 Nausea with vomiting, unspecified: Secondary | ICD-10-CM

## 2013-07-18 DIAGNOSIS — I4949 Other premature depolarization: Secondary | ICD-10-CM

## 2013-07-18 DIAGNOSIS — I4891 Unspecified atrial fibrillation: Secondary | ICD-10-CM | POA: Diagnosis present

## 2013-07-18 DIAGNOSIS — R0602 Shortness of breath: Secondary | ICD-10-CM

## 2013-07-18 LAB — TSH: TSH: 2.88 u[IU]/mL (ref 0.35–4.50)

## 2013-07-18 MED ORDER — AMINOPHYLLINE 25 MG/ML IV SOLN
75.0000 mg | Freq: Once | INTRAVENOUS | Status: AC
Start: 2013-07-18 — End: 2013-07-18
  Administered 2013-07-18: 75 mg via INTRAVENOUS

## 2013-07-18 MED ORDER — TECHNETIUM TC 99M SESTAMIBI GENERIC - CARDIOLITE
33.0000 | Freq: Once | INTRAVENOUS | Status: AC | PRN
  Administered 2013-07-18: 33 via INTRAVENOUS

## 2013-07-18 MED ORDER — REGADENOSON 0.4 MG/5ML IV SOLN
0.4000 mg | Freq: Once | INTRAVENOUS | Status: AC
Start: 2013-07-18 — End: 2013-07-18
  Administered 2013-07-18: 0.4 mg via INTRAVENOUS

## 2013-07-18 MED ORDER — TECHNETIUM TC 99M SESTAMIBI GENERIC - CARDIOLITE
11.0000 | Freq: Once | INTRAVENOUS | Status: AC | PRN
Start: 2013-07-18 — End: 2013-07-18
  Administered 2013-07-18: 11 via INTRAVENOUS

## 2013-07-18 NOTE — Progress Notes (Signed)
[facility] [place], [place] [address] [phone]    Cardiology Nuclear Med Study  Brandon [name] is a 63 y.o. male     MRN : [id number]     DOB: Jan 21, 1950  Procedure Date: 07/18/2013  Nuclear Med Background Indication for Stress Test:  Evaluation for Ischemia and Surgical Clearance:Inguinal hernia surgery with Dr. Jaymes Graff History:  Asthma and No H/O CAD 2/15 ECHO: EF:55-60% '14 AFIB-Cardioversion Cardiac Risk Factors: Hypertension and Lipids  Symptoms:  Palpitations   Nuclear Pre-Procedure Caffeine/Decaff Intake:  None NPO After: 6:00am   Lungs:  clear O2 Sat: 97% on room air. IV 0.9% NS with Angio Cath:  22g  IV Site: R Hand  IV Started by:  Matilde Haymaker, RN  Chest Size (in):  52 Cup Size: n/a  Height: 6\' 4"  (1.93 m)  Weight:  265 lb (120.203 kg)  BMI:  Body mass index is 32.27 kg/(m^2). Tech Comments:  Aminophylline 75 mg IV given for symptoms. All were resolved before leaving.    Nuclear Med Study 1 or 2 day study: 1 day  Stress Test Type:  Treadmill/Lexiscan  Reading MD: n/a  Order Authorizing Provider:  Trude Mcburney  Resting Radionuclide: Technetium 70m Sestamibi  Resting Radionuclide Dose: 11.0 mCi   Stress Radionuclide:  Technetium 36m Sestamibi  Stress Radionuclide Dose: 33.0 mCi           Stress Protocol Rest HR: 73 Stress HR: 105  Rest BP: 154/100 Stress BP: 176/116  Exercise Time (min): n/a METS: n/a   Predicted Max HR: 155 bpm % Max HR: 67.74 bpm Rate Pressure Product: 18480   Dose of Adenosine (mg):  n/a Dose of Lexiscan: 0.4 mg  Dose of Atropine (mg): n/a Dose of Dobutamine: n/a mcg/kg/min (at max HR)  Stress Test Technologist: Perrin Maltese, EMT-P  Nuclear Technologist:  Annye Rusk, CNMT     Rest Procedure:  Myocardial perfusion imaging was performed at rest 45 minutes following the intravenous administration of Technetium 10m Sestamibi. Rest ECG: NSR - Normal EKG  Stress Procedure:   The patient received IV Lexiscan 0.4 mg over 15-seconds with concurrent low level exercise and then Technetium 14m Sestamibi was injected at 30-seconds while the patient continued walking one more minute. This patient had n/v and burning in his (R) wrist with the Lexiscan injection. Quantitative spect images were obtained after a 45-minute delay. Stress ECG: There are scattered PVCs.  QPS Raw Data Images:  Mild diaphragmatic attenuation.  Normal left ventricular size. Stress Images:  There is decreased uptake in the inferior wall. Rest Images:  There is decreased uptake in the inferior wall. Subtraction (SDS):  There is a fixed inferior defect that is most consistent with diaphragmatic attenuation. Transient Ischemic Dilatation (Normal <1.22):  1.02 Lung/Heart Ratio (Normal <0.45):  0.27  Quantitative Gated Spect Images QGS EDV:  174 ml QGS ESV:  88 ml  Impression Exercise Capacity:  Lexiscan with low level exercise. BP Response:  Hypertensive blood pressure response. Clinical Symptoms:  Nausea, vomiting, no chest pain ECG Impression:  There are scattered PVCs. Comparison with Prior Nuclear Study: No previous nuclear study performed  Overall Impression:  Intermediate risk stress nuclear study with fixed inferior defect, more suggestive of artifact than scar.  LV Ejection Fraction: 49%.  LV Wall Motion:  Mild inferior hypokinesis and incoordinate septal motion  Pixie Casino, MD, [facility] Board Certified in Nuclear Cardiology Attending Cardiologist [place]

## 2013-07-19 LAB — AMIODARONE LEVEL
Amiodarone Lvl: 0.5 ug/mL — ABNORMAL LOW (ref 1.5–2.5)
Desethylamiodarone: 0.4 ug/mL — ABNORMAL LOW (ref 1.5–2.5)

## 2013-07-20 DIAGNOSIS — Z01810 Encounter for preprocedural cardiovascular examination: Secondary | ICD-10-CM | POA: Insufficient documentation

## 2013-07-21 ENCOUNTER — Telehealth: Payer: Self-pay | Admitting: Pharmacist

## 2013-07-21 MED ORDER — ENOXAPARIN SODIUM 120 MG/0.8ML ~~LOC~~ SOLN: 120.0000 mg | Freq: Two times a day (BID) | SUBCUTANEOUS | Status: DC

## 2013-07-21 NOTE — Telephone Encounter (Signed)
Spoke with pt.  Per Dr. Rayann Heman, needs Lovenox bridge for hernia repair next week.   Pt's weight- 119kg; SCr- 0.9.  Pt has done Lovenox injections in the past and feels comfortable with injection technique.   7/17- No Coumadin today 7/18- Lovenox 120mg  in PM 7/19- Lovenox 120mg  in AM and PM 7/20- Lovenox 120mg  in AM 7/21- Day of Procedure.    Pt is unsure if he will be admitted after the procedure or go home the same day.  He will speak with the surgeon's office and let us know by Monday the plan so we can give him further instructions.

## 2013-07-24 ENCOUNTER — Telehealth (INDEPENDENT_AMBULATORY_CARE_PROVIDER_SITE_OTHER): Payer: Self-pay

## 2013-07-24 ENCOUNTER — Telehealth (INDEPENDENT_AMBULATORY_CARE_PROVIDER_SITE_OTHER): Payer: Self-pay | Admitting: General Surgery

## 2013-07-24 ENCOUNTER — Encounter (INDEPENDENT_AMBULATORY_CARE_PROVIDER_SITE_OTHER): Payer: Self-pay | Admitting: General Surgery

## 2013-07-24 NOTE — Telephone Encounter (Signed)
Pt called wanting to know about his stress test results and if he was having surgery tomorrow by Dr Georgette Dover. I went to [place] his assistant about the pt b/c I did not see a sx date for the pt but I did see where the pt is already being bridged for sx. I read in office note of Dr Jackalyn Lombard that they were under the impression the pt was scheduled already for 7/21 so they started bridging the pt on Friday. I spoke to Dr Georgette Dover about this pt and he said he told the pt that he had OR time but never gave a date. Dr Georgette Dover said that he is waiting on the cardiac clearance w/stress test. I advised Deneise Lever that the pt was calling his Coumadin clinic now to get further instructions. Deneise Lever will call the pt now to talk to him b/c the surgery orders have not been turned into scheduling either.

## 2013-07-24 NOTE — Telephone Encounter (Signed)
Called patient to let him know that we are still waiting on Cardiac clearance from Dr Rayann Heman, I sent a message to Dr Rayann Heman and Gay Filler who works in the coumadin clinic and told them both that I will need to clearance and the patient understood, I told him that once I get the clearance I will get our schedule him for surgery. But I made him understand that he is not having surgery tomorrow

## 2013-07-24 NOTE — Telephone Encounter (Signed)
Brandon [name] w/[facility] calling to see if Brandon [name] received cardiac clearance from Dr Rayann Heman to get pt scheduled for sx. I spoke to [place] and she has not received the clearance. Brandon [name] was refaxing the clearance now so the pt can get scheduled for sx ASAP. They are trying to keep from stopping the Lovenox bridge if they can at all possible for the sx to be scheduled this week. I advised Brandon [name] to call their office tomorrow to let them know the status of the sx date so they can decide about the Lovenox. Brandon [name] will call them. Surgical orders being turned in today.

## 2013-07-25 NOTE — Telephone Encounter (Signed)
[facility] called, spoke with Deneise Lever surgery has been scheduled for Friday 07/28/13.  Called spoke with pt advised to continue on Lovenox 120mg .  Gve verbal dosing instructions as follows. 07/25/13 Take Lovenox 120mg  in the am and Lovenox 120mg  in the pm. 07/26/13 Take Lovenox 120mg  in the am and Lovenox 120mg  in the pm. 07/27/13 Take last dosage of Lovenox 120mg  in the am, no Lovenox in the pm prior to procedure on 07/28/13.   07/28/13 Day of Procedure.  Pt states he has enough Lovenox syringes left to take prior to procedure. Pt verbalized understanding of dosage instructions.

## 2013-07-25 NOTE — Telephone Encounter (Signed)
Called the [facility] and spoke with Danae Chen [phone] and I gave her the surgery date for Brandon [name] and the surgery date is on 07-28-13 @ [facility] at 1:00, the patient was still did not understand about his bridging for his Lovenox before surgery, I told him that he will need to call the coumadin clinic and get the Lovenox bridging cleared up

## 2013-07-26 NOTE — Telephone Encounter (Signed)
Spoke with pt he is aware of pre-procedure instructions. Per Dr Rockney Ghee and if ok'd by surgeon pt can restart Coumadin on 07/28/13 with 2.5mg s and then 5mg s Qd until appt on Tuesday, and resume Lovenox 120mg s Q12hrs on 07/29/13 until appt on Tuesday. Called pharmacy made them aware that pt will need the refill on the Lovenox.

## 2013-07-27 ENCOUNTER — Encounter (HOSPITAL_COMMUNITY): Payer: Self-pay | Admitting: *Deleted

## 2013-07-27 MED ORDER — CEFAZOLIN SODIUM 10 G IJ SOLR
3.0000 g | INTRAMUSCULAR | Status: AC
  Administered 2013-07-28: 3 g via INTRAVENOUS
  Filled 2013-07-27: qty 3000

## 2013-07-28 ENCOUNTER — Ambulatory Visit (HOSPITAL_COMMUNITY): Payer: Medicare Other | Admitting: Anesthesiology

## 2013-07-28 ENCOUNTER — Encounter (HOSPITAL_COMMUNITY): Payer: Self-pay | Admitting: Anesthesiology

## 2013-07-28 ENCOUNTER — Encounter (HOSPITAL_COMMUNITY): Payer: Medicare Other | Admitting: Anesthesiology

## 2013-07-28 ENCOUNTER — Ambulatory Visit (HOSPITAL_COMMUNITY)
Admission: RE | Admit: 2013-07-28 | Discharge: 2013-07-28 | Disposition: A | Discharge status: Home / Self Care | Payer: Medicare Other | Source: Ambulatory Visit | Attending: Surgery | Admitting: Surgery

## 2013-07-28 ENCOUNTER — Encounter (HOSPITAL_COMMUNITY)
Admission: RE | Disposition: A | Discharge status: Home / Self Care | Payer: Self-pay | Source: Ambulatory Visit | Attending: Surgery

## 2013-07-28 DIAGNOSIS — I4891 Unspecified atrial fibrillation: Secondary | ICD-10-CM | POA: Diagnosis not present

## 2013-07-28 DIAGNOSIS — Z79899 Other long term (current) drug therapy: Secondary | ICD-10-CM | POA: Diagnosis not present

## 2013-07-28 DIAGNOSIS — K4091 Unilateral inguinal hernia, without obstruction or gangrene, recurrent: Secondary | ICD-10-CM | POA: Diagnosis present

## 2013-07-28 DIAGNOSIS — Z96659 Presence of unspecified artificial knee joint: Secondary | ICD-10-CM | POA: Insufficient documentation

## 2013-07-28 DIAGNOSIS — Z9119 Patient's noncompliance with other medical treatment and regimen: Secondary | ICD-10-CM | POA: Diagnosis not present

## 2013-07-28 DIAGNOSIS — Z7901 Long term (current) use of anticoagulants: Secondary | ICD-10-CM | POA: Insufficient documentation

## 2013-07-28 DIAGNOSIS — M129 Arthropathy, unspecified: Secondary | ICD-10-CM | POA: Insufficient documentation

## 2013-07-28 DIAGNOSIS — Z91199 Patient's noncompliance with other medical treatment and regimen due to unspecified reason: Secondary | ICD-10-CM | POA: Diagnosis not present

## 2013-07-28 DIAGNOSIS — G8929 Other chronic pain: Secondary | ICD-10-CM | POA: Insufficient documentation

## 2013-07-28 DIAGNOSIS — J45909 Unspecified asthma, uncomplicated: Secondary | ICD-10-CM | POA: Insufficient documentation

## 2013-07-28 DIAGNOSIS — Z888 Allergy status to other drugs, medicaments and biological substances status: Secondary | ICD-10-CM | POA: Diagnosis not present

## 2013-07-28 DIAGNOSIS — G4730 Sleep apnea, unspecified: Secondary | ICD-10-CM | POA: Diagnosis not present

## 2013-07-28 DIAGNOSIS — I10 Essential (primary) hypertension: Secondary | ICD-10-CM | POA: Insufficient documentation

## 2013-07-28 DIAGNOSIS — F411 Generalized anxiety disorder: Secondary | ICD-10-CM | POA: Diagnosis not present

## 2013-07-28 HISTORY — PX: INSERTION OF MESH: SHX5868

## 2013-07-28 HISTORY — DX: Restless legs syndrome: G25.81

## 2013-07-28 HISTORY — PX: INGUINAL HERNIA REPAIR: SHX194

## 2013-07-28 LAB — COMPREHENSIVE METABOLIC PANEL
ALT: 61 U/L — ABNORMAL HIGH (ref 0–53)
AST: 59 U/L — ABNORMAL HIGH (ref 0–37)
Albumin: 4.5 g/dL (ref 3.5–5.2)
Alkaline Phosphatase: 70 U/L (ref 39–117)
Anion gap: 15 (ref 5–15)
BUN: 13 mg/dL (ref 6–23)
CO2: 30 mEq/L (ref 19–32)
Calcium: 9.1 mg/dL (ref 8.4–10.5)
Chloride: 99 mEq/L (ref 96–112)
Creatinine, Ser: 0.85 mg/dL (ref 0.50–1.35)
GFR calc Af Amer: >90 mL/min (ref >90)
GFR calc non Af Amer: 89 mL/min — ABNORMAL LOW (ref >90)
Glucose, Bld: 111 mg/dL — ABNORMAL HIGH (ref 70–99)
Potassium: 3.4 mEq/L — ABNORMAL LOW (ref 3.7–5.3)
Sodium: 144 mEq/L (ref 137–147)
Total Bilirubin: 0.7 mg/dL (ref 0.3–1.2)
Total Protein: 7.3 g/dL (ref 6.0–8.3)

## 2013-07-28 LAB — CBC
HCT: 42.8 % (ref 39.0–52.0)
Hemoglobin: 14.6 g/dL (ref 13.0–17.0)
MCH: 30.7 pg (ref 26.0–34.0)
MCHC: 34.1 g/dL (ref 30.0–36.0)
MCV: 89.9 fL (ref 78.0–100.0)
Platelets: 147 10*3/uL — ABNORMAL LOW (ref 150–400)
RBC: 4.76 MIL/uL (ref 4.22–5.81)
RDW: 13.7 % (ref 11.5–15.5)
WBC: 6.5 10*3/uL (ref 4.0–10.5)

## 2013-07-28 LAB — PROTIME-INR
INR: 1.09 (ref 0.00–1.49)
Prothrombin Time: 14.1 seconds (ref 11.6–15.2)

## 2013-07-28 SURGERY — REPAIR, HERNIA, INGUINAL, ADULT
Anesthesia: General | Site: Groin | Laterality: Right

## 2013-07-28 MED ORDER — HYDROMORPHONE HCL PF 1 MG/ML IJ SOLN
0.5000 mg | Freq: Once | INTRAMUSCULAR | Status: AC
  Administered 2013-07-28: 0.5 mg via INTRAVENOUS

## 2013-07-28 MED ORDER — OXYCODONE-ACETAMINOPHEN 5-325 MG PO TABS
ORAL_TABLET | ORAL | Status: AC
  Filled 2013-07-28: qty 1

## 2013-07-28 MED ORDER — CHLORHEXIDINE GLUCONATE 4 % EX LIQD
1.0000 "application " | Freq: Once | CUTANEOUS | Status: DC
  Filled 2013-07-28: qty 15

## 2013-07-28 MED ORDER — BUPIVACAINE HCL (PF) 0.5 % IJ SOLN
INTRAMUSCULAR | Status: DC | PRN
  Administered 2013-07-28: 20 mL

## 2013-07-28 MED ORDER — FENTANYL CITRATE 0.05 MG/ML IJ SOLN
50.0000 ug | INTRAMUSCULAR | Status: DC | PRN
  Administered 2013-07-28: 50 ug via INTRAVENOUS
  Filled 2013-07-28: qty 2

## 2013-07-28 MED ORDER — MIDAZOLAM HCL 2 MG/2ML IJ SOLN
INTRAMUSCULAR | Status: AC
  Filled 2013-07-28: qty 2

## 2013-07-28 MED ORDER — HYDROMORPHONE HCL PF 1 MG/ML IJ SOLN
INTRAMUSCULAR | Status: AC
  Filled 2013-07-28: qty 1

## 2013-07-28 MED ORDER — METOCLOPRAMIDE HCL 5 MG/ML IJ SOLN
INTRAMUSCULAR | Status: DC | PRN
  Administered 2013-07-28: 10 mg via INTRAVENOUS

## 2013-07-28 MED ORDER — OXYCODONE HCL 5 MG/5ML PO SOLN: 5.0000 mg | Freq: Once | ORAL | Status: DC | PRN

## 2013-07-28 MED ORDER — HYDROMORPHONE HCL PF 1 MG/ML IJ SOLN
0.2500 mg | INTRAMUSCULAR | Status: DC | PRN
  Administered 2013-07-28 (×4): 0.5 mg via INTRAVENOUS

## 2013-07-28 MED ORDER — ARTIFICIAL TEARS OP OINT
TOPICAL_OINTMENT | OPHTHALMIC | Status: AC
  Filled 2013-07-28: qty 3.5

## 2013-07-28 MED ORDER — ONDANSETRON HCL 4 MG/2ML IJ SOLN
INTRAMUSCULAR | Status: DC | PRN
  Administered 2013-07-28: 4 mg via INTRAVENOUS

## 2013-07-28 MED ORDER — LIDOCAINE HCL (CARDIAC) 20 MG/ML IV SOLN
INTRAVENOUS | Status: DC | PRN
Start: 2013-07-28 — End: 2013-07-28
  Administered 2013-07-28: 100 mg via INTRAVENOUS

## 2013-07-28 MED ORDER — DEXAMETHASONE SODIUM PHOSPHATE 4 MG/ML IJ SOLN
INTRAMUSCULAR | Status: DC | PRN
  Administered 2013-07-28: 4 mg via INTRAVENOUS

## 2013-07-28 MED ORDER — MIDAZOLAM HCL 2 MG/2ML IJ SOLN
1.0000 mg | INTRAMUSCULAR | Status: DC | PRN
  Administered 2013-07-28: 2 mg via INTRAVENOUS
  Filled 2013-07-28: qty 2

## 2013-07-28 MED ORDER — OXYCODONE-ACETAMINOPHEN 5-325 MG PO TABS
1.0000 | ORAL_TABLET | ORAL | Status: DC | PRN
  Administered 2013-07-28: 1 via ORAL

## 2013-07-28 MED ORDER — METOCLOPRAMIDE HCL 5 MG/ML IJ SOLN
10.0000 mg | Freq: Once | INTRAMUSCULAR | Status: AC
  Administered 2013-07-28: 10 mg via INTRAVENOUS
  Filled 2013-07-28 (×2): qty 2

## 2013-07-28 MED ORDER — ONDANSETRON 4 MG PO TBDP: 4.0000 mg | ORAL_TABLET | Freq: Four times a day (QID) | ORAL | Status: DC | PRN

## 2013-07-28 MED ORDER — MIDAZOLAM HCL 5 MG/5ML IJ SOLN
INTRAMUSCULAR | Status: DC | PRN
  Administered 2013-07-28 (×2): 2 mg via INTRAVENOUS

## 2013-07-28 MED ORDER — FENTANYL CITRATE 0.05 MG/ML IJ SOLN
INTRAMUSCULAR | Status: DC | PRN
  Administered 2013-07-28: 50 ug via INTRAVENOUS
  Administered 2013-07-28 (×2): 100 ug via INTRAVENOUS

## 2013-07-28 MED ORDER — OXYCODONE HCL 5 MG PO TABS: 5.0000 mg | ORAL_TABLET | Freq: Once | ORAL | Status: DC | PRN

## 2013-07-28 MED ORDER — MORPHINE SULFATE 2 MG/ML IJ SOLN: 2.0000 mg | INTRAMUSCULAR | Status: DC | PRN

## 2013-07-28 MED ORDER — 0.9 % SODIUM CHLORIDE (POUR BTL) OPTIME
TOPICAL | Status: DC | PRN
  Administered 2013-07-28: 1000 mL

## 2013-07-28 MED ORDER — FENTANYL CITRATE 0.05 MG/ML IJ SOLN
INTRAMUSCULAR | Status: AC
  Filled 2013-07-28: qty 5

## 2013-07-28 MED ORDER — LIDOCAINE HCL (CARDIAC) 20 MG/ML IV SOLN
INTRAVENOUS | Status: AC
Start: 2013-07-28 — End: 2013-07-28
  Filled 2013-07-28: qty 5

## 2013-07-28 MED ORDER — PROPOFOL 10 MG/ML IV BOLUS
INTRAVENOUS | Status: AC
  Filled 2013-07-28: qty 20

## 2013-07-28 MED ORDER — BUPIVACAINE-EPINEPHRINE 0.25% -1:200000 IJ SOLN
INTRAMUSCULAR | Status: DC | PRN
  Administered 2013-07-28: 10 mL

## 2013-07-28 MED ORDER — ONDANSETRON HCL 4 MG/2ML IJ SOLN
INTRAMUSCULAR | Status: AC
  Filled 2013-07-28: qty 2

## 2013-07-28 MED ORDER — OXYCODONE-ACETAMINOPHEN 5-325 MG PO TABS: 1.0000 | ORAL_TABLET | ORAL | Status: DC | PRN

## 2013-07-28 MED ORDER — ONDANSETRON HCL 4 MG/2ML IJ SOLN
4.0000 mg | Freq: Once | INTRAMUSCULAR | Status: AC
  Administered 2013-07-28: 4 mg via INTRAVENOUS
  Filled 2013-07-28: qty 2

## 2013-07-28 MED ORDER — GLYCOPYRROLATE 0.2 MG/ML IJ SOLN
INTRAMUSCULAR | Status: DC | PRN
  Administered 2013-07-28: 0.2 mg via INTRAVENOUS

## 2013-07-28 MED ORDER — METOCLOPRAMIDE HCL 5 MG/ML IJ SOLN: 10.0000 mg | Freq: Once | INTRAMUSCULAR | Status: DC | PRN

## 2013-07-28 MED ORDER — SUCCINYLCHOLINE CHLORIDE 20 MG/ML IJ SOLN
INTRAMUSCULAR | Status: AC
  Filled 2013-07-28: qty 1

## 2013-07-28 MED ORDER — LACTATED RINGERS IV SOLN
INTRAVENOUS | Status: DC | PRN
  Administered 2013-07-28 (×2): via INTRAVENOUS

## 2013-07-28 MED ORDER — LACTATED RINGERS IV SOLN
INTRAVENOUS | Status: DC
Start: 2013-07-28 — End: 2013-07-28
  Administered 2013-07-28: 11:00:00 via INTRAVENOUS

## 2013-07-28 MED ORDER — PROPOFOL 10 MG/ML IV BOLUS
INTRAVENOUS | Status: DC | PRN
  Administered 2013-07-28: 200 mg via INTRAVENOUS

## 2013-07-28 MED ORDER — EPHEDRINE SULFATE 50 MG/ML IJ SOLN
INTRAMUSCULAR | Status: DC | PRN
  Administered 2013-07-28 (×4): 10 mg via INTRAVENOUS
  Administered 2013-07-28: 5 mg via INTRAVENOUS

## 2013-07-28 SURGICAL SUPPLY — 47 items
BENZOIN TINCTURE PRP APPL 2/3 (GAUZE/BANDAGES/DRESSINGS) ×2 IMPLANT
BLADE SURG ROTATE 9660 (MISCELLANEOUS) IMPLANT
CHLORAPREP W/TINT 26ML (MISCELLANEOUS) ×2 IMPLANT
COVER SURGICAL LIGHT HANDLE (MISCELLANEOUS) ×2 IMPLANT
DECANTER SPIKE VIAL GLASS SM (MISCELLANEOUS) IMPLANT
DRAIN PENROSE 1/2X12 LTX STRL (WOUND CARE) ×2 IMPLANT
DRAPE LAPAROSCOPIC ABDOMINAL (DRAPES) IMPLANT
DRAPE LAPAROTOMY TRNSV 102X78 (DRAPE) ×2 IMPLANT
DRAPE UTILITY 15X26 W/TAPE STR (DRAPE) ×4 IMPLANT
DRSG TEGADERM 4X4.75 (GAUZE/BANDAGES/DRESSINGS) ×2 IMPLANT
ELECT CAUTERY BLADE 6.4 (BLADE) ×2 IMPLANT
ELECT REM PT RETURN 9FT ADLT (ELECTROSURGICAL) ×2
ELECTRODE REM PT RTRN 9FT ADLT (ELECTROSURGICAL) ×1 IMPLANT
GAUZE SPONGE 4X4 16PLY XRAY LF (GAUZE/BANDAGES/DRESSINGS) ×2 IMPLANT
GLOVE BIO SURGEON STRL SZ7 (GLOVE) ×2 IMPLANT
GLOVE BIOGEL PI IND STRL 7.5 (GLOVE) ×3 IMPLANT
GLOVE BIOGEL PI INDICATOR 7.5 (GLOVE) ×3
GLOVE ECLIPSE 7.5 STRL STRAW (GLOVE) ×2 IMPLANT
GOWN STRL REUS W/ TWL LRG LVL3 (GOWN DISPOSABLE) ×2 IMPLANT
GOWN STRL REUS W/TWL LRG LVL3 (GOWN DISPOSABLE) ×2
KIT BASIN OR (CUSTOM PROCEDURE TRAY) ×2 IMPLANT
KIT ROOM TURNOVER OR (KITS) ×2 IMPLANT
MESH PARIETEX PROGRIP RIGHT (Mesh General) ×2 IMPLANT
NEEDLE HYPO 25GX1X1/2 BEV (NEEDLE) ×2 IMPLANT
NS IRRIG 1000ML POUR BTL (IV SOLUTION) ×2 IMPLANT
PACK SURGICAL SETUP 50X90 (CUSTOM PROCEDURE TRAY) ×2 IMPLANT
PAD ARMBOARD 7.5X6 YLW CONV (MISCELLANEOUS) ×2 IMPLANT
PENCIL BUTTON HOLSTER BLD 10FT (ELECTRODE) ×2 IMPLANT
SPECIMEN JAR SMALL (MISCELLANEOUS) IMPLANT
SPONGE GAUZE 4X4 12PLY (GAUZE/BANDAGES/DRESSINGS) ×2 IMPLANT
SPONGE GAUZE 4X4 12PLY STER LF (GAUZE/BANDAGES/DRESSINGS) ×2 IMPLANT
SPONGE INTESTINAL PEANUT (DISPOSABLE) ×2 IMPLANT
STRIP CLOSURE SKIN 1/2X4 (GAUZE/BANDAGES/DRESSINGS) ×2 IMPLANT
SUT MNCRL AB 4-0 PS2 18 (SUTURE) ×2 IMPLANT
SUT PDS AB 0 CT 36 (SUTURE) IMPLANT
SUT SILK 2 0 SH (SUTURE) IMPLANT
SUT SILK 3 0 (SUTURE)
SUT SILK 3-0 18XBRD TIE 12 (SUTURE) IMPLANT
SUT VIC AB 0 CT2 27 (SUTURE) ×2 IMPLANT
SUT VIC AB 2-0 SH 27 (SUTURE) ×1
SUT VIC AB 2-0 SH 27X BRD (SUTURE) ×1 IMPLANT
SUT VIC AB 3-0 SH 27 (SUTURE) ×1
SUT VIC AB 3-0 SH 27XBRD (SUTURE) ×1 IMPLANT
SYR CONTROL 10ML LL (SYRINGE) ×2 IMPLANT
TOWEL OR 17X24 6PK STRL BLUE (TOWEL DISPOSABLE) ×2 IMPLANT
TOWEL OR 17X26 10 PK STRL BLUE (TOWEL DISPOSABLE) ×2 IMPLANT
TOWEL OR NON WOVEN STRL DISP B (DISPOSABLE) ×2 IMPLANT

## 2013-07-28 NOTE — Anesthesia Preprocedure Evaluation (Addendum)
Anesthesia Evaluation  Patient identified by MRN, date of birth, ID band Patient awake    Reviewed: Allergy & Precautions, H&P , NPO status , Patient's Chart, lab work & pertinent test results, reviewed documented beta blocker date and time   Airway Mallampati: II TM Distance: >3 FB Neck ROM: full    Dental   Pulmonary shortness of breath, asthma , sleep apnea ,  breath sounds clear to auscultation        Cardiovascular hypertension, Pt. on medications Rhythm:regular     Neuro/Psych PSYCHIATRIC DISORDERS  Neuromuscular disease    GI/Hepatic negative GI ROS, (+) Hepatitis -, C  Endo/Other  negative endocrine ROS  Renal/GU negative Renal ROS  negative genitourinary   Musculoskeletal   Abdominal   Peds  Hematology negative hematology ROS (+)   Anesthesia Other Findings See surgeon's H&P   Reproductive/Obstetrics negative OB ROS                         Anesthesia Physical Anesthesia Plan  ASA: III  Anesthesia Plan: General   Post-op Pain Management:    Induction: Intravenous  Airway Management Planned: Oral ETT  Additional Equipment:   Intra-op Plan:   Post-operative Plan:   Informed Consent: I have reviewed the patients History and Physical, chart, labs and discussed the procedure including the risks, benefits and alternatives for the proposed anesthesia with the patient or authorized representative who has indicated his/her understanding and acceptance.   Dental Advisory Given  Plan Discussed with: CRNA and Surgeon  Anesthesia Plan Comments:       Anesthesia Quick Evaluation

## 2013-07-28 NOTE — Discharge Instructions (Signed)
[facility] ° ° INGUINAL HERNIA REPAIR: POST OP INSTRUCTIONS ° °Always review your discharge instruction sheet given to you by the facility where your surgery was performed. °IF YOU HAVE DISABILITY OR FAMILY LEAVE FORMS, YOU MUST BRING THEM TO THE OFFICE FOR PROCESSING.   °DO NOT GIVE THEM TO YOUR DOCTOR. ° °1. A  prescription for pain medication may be given to you upon discharge.  Take your pain medication as prescribed, if needed.  If narcotic pain medicine is not needed, then you may take acetaminophen (Tylenol) or ibuprofen (Advil) as needed. °2. Take your usually prescribed medications unless otherwise directed. °3. If you need a refill on your pain medication, please contact your pharmacy.  They will contact our office to request authorization. Prescriptions will not be filled after 5 pm or on week-ends. °4. You should follow a light diet the first 24 hours after arrival home, such as soup and crackers, etc.  Be sure to include lots of fluids daily.  Resume your normal diet the day after surgery. °5. Most patients will experience some swelling and bruising around the umbilicus or in the groin and scrotum.  Ice packs and reclining will help.  Swelling and bruising can take several days to resolve.  °6. It is common to experience some constipation if taking pain medication after surgery.  Increasing fluid intake and taking a stool softener (such as Colace) will usually help or prevent this problem from occurring.  A mild laxative (Milk of Magnesia or Miralax) should be taken according to package directions if there are no bowel movements after 48 hours. °7. Unless discharge instructions indicate otherwise, you may remove your bandages 24-48 hours after surgery, and you may shower at that time.  You will have steri-strips (small skin tapes) in place directly over the incision.  These strips should be left on the skin for 7-10 days. °8. ACTIVITIES:  You may resume regular (light) daily activities  beginning the next day--such as daily self-care, walking, climbing stairs--gradually increasing activities as tolerated.  You may have sexual intercourse when it is comfortable.  Refrain from any heavy lifting or straining until approved by your doctor. °a. You may drive when you are no longer taking prescription pain medication, you can comfortably wear a seatbelt, and you can safely maneuver your car and apply brakes. °b. RETURN TO WORK:  2-3 weeks with light duty - no lifting over 15 lbs. °9. You should see your doctor in the office for a follow-up appointment approximately 2-3 weeks after your surgery.  Make sure that you call for this appointment within a day or two after you arrive home to insure a convenient appointment time. °10. OTHER INSTRUCTIONS:  __________________________________________________________________________________________________________________________________________________________________________________________  °WHEN TO CALL YOUR DOCTOR: °1. Fever over 101.0 °2. Inability to urinate °3. Nausea and/or vomiting °4. Extreme swelling or bruising °5. Continued bleeding from incision. °6. Increased pain, redness, or drainage from the incision ° °The clinic staff is available to answer your questions during regular business hours.  Please don’t hesitate to call and ask to speak to one of the nurses for clinical concerns.  If you have a medical emergency, go to the nearest emergency room or call 911.  A surgeon from [facility] is always on call at the hospital ° ° °[address], [address], [place], [place]  [address] ? ° [address] [address], [place],    [address] °[phone]    [phone]    FAX [phone] °Web site:   [email or web address] ° °What to eat: ° °For your first meals, you should eat lightly; only small meals initially.  If you do not have nausea, you may eat larger meals.  Avoid spicy, greasy and heavy food.   ° °General Anesthesia, Adult, Care  After  °Refer to this sheet in the next few weeks. These instructions provide you with information on caring for yourself after your procedure. Your health care provider may also give you more specific instructions. Your treatment has been planned according to current medical practices, but problems sometimes occur. Call your health care provider if you have any problems or questions after your procedure.  °WHAT TO EXPECT AFTER THE PROCEDURE  °After the procedure, it is typical to experience:  °Sleepiness.  °Nausea and vomiting. °HOME CARE INSTRUCTIONS  °For the first 24 hours after general anesthesia:  °Have a responsible person with you.  °Do not drive a car. If you are alone, do not take public transportation.  °Do not drink alcohol.  °Do not take medicine that has not been prescribed by your health care provider.  °Do not sign important papers or make important decisions.  °You may resume a normal diet and activities as directed by your health care provider.  °Change bandages (dressings) as directed.  °If you have questions or problems that seem related to general anesthesia, call the hospital and ask for the anesthetist or anesthesiologist on call. °SEEK MEDICAL CARE IF:  °You have nausea and vomiting that continue the day after anesthesia.  °You develop a rash. °SEEK IMMEDIATE MEDICAL CARE IF:  °You have difficulty breathing.  °You have chest pain.  °You have any allergic problems. °Document Released: 03/30/2000 Document Revised: 08/24/2012 Document Reviewed: 07/07/2012  °Exit[employer]® Patient Information ©2014 [employer].  ° ° °

## 2013-07-28 NOTE — Op Note (Signed)
Hernia, Open, Procedure Note  Indications: The patient presented with a history of a right, reducible recurrent inguinal hernia.    Pre-operative Diagnosis: right reducible recurrent inguinal hernia Post-operative Diagnosis: same  Surgeon: Maia Petties.   Assistants: Judyann Munson RNFA  Anesthesia: General endotracheal anesthesia/ TAP block  ASA Class: 2  Procedure Details  The patient was seen again in the Holding Room. The risks, benefits, complications, treatment options, and expected outcomes were discussed with the patient. The possibilities of reaction to medication, pulmonary aspiration, perforation of viscus, bleeding, recurrent infection, the need for additional procedures, and development of a complication requiring transfusion or further operation were discussed with the patient and/or family. The likelihood of success in repairing the hernia and returning the patient to their previous functional status is good.  There was concurrence with the proposed plan, and informed consent was obtained. The site of surgery was properly noted/marked. The patient was taken to the Operating Room, identified as Brandon [name], and the procedure verified as right inguinal hernia repair. A Time Out was held and the above information confirmed.  The patient was placed in the supine position and underwent induction of anesthesia. The lower abdomen and groin was prepped with Chloraprep and draped in the standard fashion, and 0.25% Marcaine with epinephrine was used to anesthetize the skin over the mid-portion of the inguinal canal. An oblique incision was made. Dissection was carried down through the subcutaneous tissue with cautery to the external oblique fascia.  We opened the external oblique fascia along the direction of its fibers to the external ring.  The spermatic cord was circumferentially dissected bluntly and retracted with a Penrose drain.  The ilioinguinal nerve was identified and  preserved.  The floor of the inguinal canal was inspected and was intact.  We skeletonized the spermatic cord and reduced a large indirect hernia with cord lipomas.  The preperitoneal mesh was not palpable.  The internal ring was tightened with 0 Vicryl  We used a right-sided Progrip mesh which was inserted and deployed across the floor of the inguinal canal. The mesh was tucked underneath the external oblique fascia laterally.  The flap of the mesh was closed around the spermatic cord to recreate the internal inguinal ring.  The mesh was secured to the pubic tubercle with 0 Vicryl.  The mesh was secured near the internal ring with 0 Vicryl. The external oblique fascia was reapproximated with 2-0 Vicryl.  There was a tear in the external oblique fascia that was closed with 2-0 Vicryl.  3-0 Vicryl was used to close the subcutaneous tissues and 4-0 Monocryl was used to close the skin in subcuticular fashion.  Benzoin and steri-strips were used to seal the incision.  A clean dressing was applied.  The patient was then extubated and brought to the recovery room in stable condition.  All sponge, instrument, and needle counts were correct prior to closure and at the conclusion of the case.   Estimated Blood Loss: Minimal                 Complications: None; patient tolerated the procedure well.         Disposition: PACU - hemodynamically stable.         Condition: stable  Brandon [name]. Brandon Dover, MD, [facility]  General/ Trauma Surgery  07/28/2013 1:36 PM

## 2013-07-28 NOTE — H&P (View-Only) (Signed)
Patient ID: Brandon [name], male   DOB: 1950-12-13, 63 y.o.   MRN: [id number] HPI  Brandon [name] is a 63 y.o. male. Referred by Dr. Scarlette Ar for right inguinal hernia  HPI  This is a 63 year old male who is status post laparoscopic repair of bilateral inguinal hernias In July of 2013. He also had a small umbilical hernia repaired. He had been doing fairly well. Recently he had a severe upper respiratory infection and had a lot of severe coughing. About 2 weeks ago he was coughing and felt some pain in his right groin. He subsequently developed some firmness in this area. This was treated with ice and has improved slightly. He continues to feel some discomfort in this area.   CT scan showed a fat containing right inguinal hernia; some fat in left inguinal canal, but no symptoms  He has become quite symptomatic since the last visit  The patient has been having some problems with his persistent atrial fibrillation and has required cardioversion. He remains anticoagulated. He was being considered for a Maze procedure but that is on hold for now.  Past Medical History   Diagnosis  Date   .  Arthritis    .  Sciatica    .  Anxiety    .  Hypertension    .  Osteoporosis    .  Sleep apnea      noncompliant with CPAP   .  Cataract    .  Asthma      Patient and wife both dinies patient has or had asthma   .  Enlarged prostate    .  Left atrial enlargement    .  Persistent atrial fibrillation    .  Obesity    .  Bilateral pulmonary embolism  3/14     admitted to [facility], treated with Xarelto   .  Chronic pain    .  Thrombus of left atrial appendage  03/09/2013   .  Mitral regurgitation  04/24/2013     Mild by TEE    Past Surgical History   Procedure  Laterality  Date   .  Knee arthroscopy       left   .  Colonoscopy w/ polypectomy     .  Rotator cuff repair       right   .  Tonsillectomy     .  Cataract extraction     .  Hernia repair   07/06/2011   .  Replacement total knee     .  Tee  without cardioversion  N/A  03/21/2012     Procedure: TRANSESOPHAGEAL ECHOCARDIOGRAM (TEE); Surgeon: Birdie Riddle, MD; Location: [place]; Service: Cardiovascular; Laterality: N/A;   .  Cardioversion  N/A  03/21/2012     Procedure: CARDIOVERSION; Surgeon: Birdie Riddle, MD; Location: [facility] ENDOSCOPY; Service: Cardiovascular; Laterality: N/A;   .  Tee without cardioversion  N/A  04/24/2013     Procedure: TRANSESOPHAGEAL ECHOCARDIOGRAM (TEE); Surgeon: Dorothy Spark, MD; Location: [place]; Service: Cardiovascular; Laterality: N/A;   .  Cardioversion  N/A  04/24/2013     Procedure: CARDIOVERSION; Surgeon: Dorothy Spark, MD; Location: [facility] ENDOSCOPY; Service: Cardiovascular; Laterality: N/A;    Family History   Problem  Relation  Age of Onset   .  Cancer  Father      bone   .  Cancer  Paternal Grandmother      ovarian   Social History  History  Substance Use Topics   .  Smoking status:  Never Smoker   .  Smokeless tobacco:  Never Used   .  Alcohol Use:  Yes      Comment: 1 beer per month    Allergies   Allergen  Reactions   .  Ace Inhibitors  Other (See Comments)     COUGH    Current Outpatient Prescriptions   Medication  Sig  Dispense  Refill   .  amiodarone (PACERONE) 200 MG tablet  Take 1 tablet (200 mg total) by mouth daily.  30 tablet  3   .  diazepam (VALIUM) 5 MG tablet  Take 1 tablet by mouth at bedtime.     Marland Kitchen  escitalopram (LEXAPRO) 20 MG tablet  Take 20 mg by mouth daily.     Marland Kitchen  gabapentin (NEURONTIN) 800 MG tablet  Take 1/2 to 1 tablet at dinner and take 1 tablet at bedtime     .  metoprolol tartrate (LOPRESSOR) 25 MG tablet  Take 1 tablet (25 mg total) by mouth 2 (two) times daily.  180 tablet  3   .  Naproxen Sodium (ALEVE) 220 MG CAPS  Take 1 capsule by mouth as needed.     .  tamsulosin (FLOMAX) 0.4 MG CAPS capsule  Take 2 capsules by mouth daily.     Marland Kitchen  warfarin (COUMADIN) 5 MG tablet  1/2 tablet daily except 1 tablet on Sunday, Tuesday and Thursday or as  directed by coumadin clinic  30 tablet  3    No current facility-administered medications for this visit.   Review of Systems  Review of Systems  Constitutional: Negative for fever, chills and unexpected weight change.  HENT: Negative for congestion, hearing loss, sore throat, trouble swallowing and voice change.  Eyes: Negative for visual disturbance.  Respiratory: Negative for cough and wheezing.  Cardiovascular: Negative for chest pain, palpitations and leg swelling.  Gastrointestinal: Negative for nausea, vomiting, abdominal pain, diarrhea, constipation, blood in stool, abdominal distention, anal bleeding and rectal pain.  Genitourinary: Negative for hematuria and difficulty urinating.  Musculoskeletal: Negative for arthralgias.  Skin: Negative for rash and wound.  Neurological: Negative for seizures, syncope, weakness and headaches.  Hematological: Negative for adenopathy. Does not bruise/bleed easily.  Psychiatric/Behavioral: Negative for confusion.  Blood pressure 126/80, pulse 57, temperature 98 F (36.7 C), height 6\' 4"  (1.93 m), weight 269 lb (122.018 kg).  Physical Exam  Physical Exam  WDWN in NAD  HEENT: EOMI, sclera anicteric  Neck: No masses, no thyromegaly  Lungs: CTA bilaterally; normal respiratory effort  CV: Regular rate and rhythm; no murmurs  Abd: +bowel sounds, soft, non-tender, no masses; healed umbilical incision  GU: Bilateral descended testes; no testicular masses;obvious right inguinal hernia - reducible; no sign of left inguinal hernia Ext: Well-perfused; no edema  Skin: Warm, dry; no sign of jaundice  Data Reviewed  Ct Pelvis W Contrast  06/27/2013   CLINICAL DATA:  63 year old male with right groin pain after severe coughing 6 weeks ago. History of bilateral inguinal hernia repair several years ago. Nausea. Initial encounter.  BUN and creatinine were obtained on site at [place] at [address]Results: BUN 15 mg/dL, Creatinine 1.1 mg/dL.   EXAM: CT PELVIS WITH CONTRAST  TECHNIQUE: Multidetector CT imaging of the pelvis was performed using the standard protocol following the bolus administration of intravenous contrast.  CONTRAST:  151mL OMNIPAQUE IOHEXOL 300 MG/ML  SOLN  COMPARISON:  CT Abdomen and Pelvis 02/08/2010.  FINDINGS: Bilateral fact containing inguinal hernias persist, greater on the right. That on the left does appear decreased since 2012. Increased mesenteric component of fat suspected on the right. No herniated bowel. No pelvic or lower abdominal free fluid.  Negative distal colon except for sigmoid redundancy. There is diverticulosis in the distal descending colon. Nondilated distal small bowel loops containing contrast. Negative visualized cecum. Visualized major arterial structures in the lower abdomen and pelvis are patent with chronic dolichoectasia. No lymphadenopathy. No acute osseous abnormality identified. Chronic deformity proximal left femur.  IMPRESSION: Right greater than left fat containing inguinal hernias. That on the right appears increased compared to 2012, while that on the left appears decreased. No herniated bowel or free fluid.   Electronically Signed   By: Lars Pinks M.D.   On: 06/27/2013 16:15     Assessment  Right inguinal hernia repair with mesh - recurrent, reducible  Plan:  Right inguinal hernia repair with mesh.  The surgical procedure has been discussed with the patient.  Potential risks, benefits, alternative treatments, and expected outcomes have been explained.  All of the patient's questions at this time have been answered.  The likelihood of reaching the patient's treatment goal is good.  The patient understand the proposed surgical procedure and wishes to proceed.

## 2013-07-28 NOTE — Progress Notes (Signed)
Pt reports nausea. Dr. Albertina Parr called and informed with 4mg  IV Zofran and 10mg  IV Reglan ordered. Zofran given, awaiting Reglan from pharmacy. Pt reports decrease in nausea after Zofran. Call bell in reach, will monitor.

## 2013-07-28 NOTE — Interval H&P Note (Signed)
History and Physical Interval Note:  07/28/2013 10:49 AM  Brandon [name]  has presented today for surgery, with the diagnosis of recurrent right inguinal hernia  The various methods of treatment have been discussed with the patient and family. After consideration of risks, benefits and other options for treatment, the patient has consented to  Procedure(s): RIGHT INGUINAL HERNIA REPAIR (Right) INSERTION OF MESH (N/A) as a surgical intervention .  The patient's history has been reviewed, patient examined, no change in status, stable for surgery.  I have reviewed the patient's chart and labs.  Questions were answered to the patient's satisfaction.     Jullie Arps K.

## 2013-07-28 NOTE — Anesthesia Procedure Notes (Addendum)
Anesthesia Regional Block:  TAP block  Pre-Anesthetic Checklist: ,, timeout performed, Correct Patient, Correct Site, Correct Laterality, Correct Procedure, Correct Position, site marked, Risks and benefits discussed,  Surgical consent,  Pre-op evaluation,  At surgeon's request and post-op pain management  Laterality: Right  Prep: chloraprep       Needles:  Injection technique: Single-shot  Needle Type: Echogenic Needle     Needle Length: 9cm 9 cm Needle Gauge: 21 and 21 G    Additional Needles:  Procedures: ultrasound guided (picture in chart) TAP block Narrative:  Start time: 07/28/2013 11:52 AM End time: 07/28/2013 12:00 PM Injection made incrementally with aspirations every 5 mL.  Performed by: Personally  Anesthesiologist: Nada Libman, MD  Additional Notes: Ultrasound guidance used to: id relevant anatomy, confirm needle position, local anesthetic spread, avoidance of vascular puncture. Picture saved. No complications. Block performed personally by Jessy Oto. Albertina Parr, MD     Procedure Name: Intubation Date/Time: 07/28/2013 12:46 PM Performed by: Antonietta Breach Pre-anesthesia Checklist: Patient identified, Emergency Drugs available, Suction available, Patient being monitored and Timeout performed Patient Re-evaluated:Patient Re-evaluated prior to inductionOxygen Delivery Method: Circle system utilized Preoxygenation: Pre-oxygenation with 100% oxygen Intubation Type: IV induction LMA: LMA inserted LMA Size: 5.0 Tube secured with: Tape Dental Injury: Teeth and Oropharynx as per pre-operative assessment

## 2013-07-28 NOTE — Progress Notes (Signed)
Pt reports that nausea has been relieved.

## 2013-07-28 NOTE — Anesthesia Postprocedure Evaluation (Signed)
Anesthesia Post Note  Patient: Brandon [name]  Procedure(s) Performed: Procedure(s) (LRB): RIGHT INGUINAL HERNIA REPAIR (Right) INSERTION OF MESH (Right)  Anesthesia type: General  Patient location: PACU  Post pain: Pain level controlled  Post assessment: Patient's Cardiovascular Status Stable  Last Vitals:  Filed Vitals:   07/28/13 1456  BP: 152/86  Pulse: 74  Temp:   Resp: 17    Post vital signs: Reviewed and stable  Level of consciousness: alert  Complications: No apparent anesthesia complications

## 2013-07-28 NOTE — Progress Notes (Signed)
Dr. Albertina Parr notified about patient's c/o pain still 6-7/10 after using the bathroom, already received 2mg  total of dilaudid IV, 1 percocet. Order received.

## 2013-07-28 NOTE — Transfer of Care (Signed)
Immediate Anesthesia Transfer of Care Note  Patient: Brandon [name]  Procedure(s) Performed: Procedure(s): RIGHT INGUINAL HERNIA REPAIR (Right) INSERTION OF MESH (Right)  Patient Location: PACU  Anesthesia Type:GA combined with regional for post-op pain  Level of Consciousness: lethargic and responds to stimulation  Airway & Oxygen Therapy: Patient Spontanous Breathing and Patient connected to nasal cannula oxygen  Post-op Assessment: Report given to PACU RN and Post -op Vital signs reviewed and stable  Post vital signs: Reviewed and stable  Complications: No apparent anesthesia complications

## 2013-08-01 ENCOUNTER — Ambulatory Visit (INDEPENDENT_AMBULATORY_CARE_PROVIDER_SITE_OTHER): Payer: Medicare Other | Admitting: Surgery

## 2013-08-01 ENCOUNTER — Telehealth (INDEPENDENT_AMBULATORY_CARE_PROVIDER_SITE_OTHER): Payer: Self-pay

## 2013-08-01 ENCOUNTER — Other Ambulatory Visit (INDEPENDENT_AMBULATORY_CARE_PROVIDER_SITE_OTHER): Payer: Self-pay | Admitting: Surgery

## 2013-08-01 ENCOUNTER — Encounter (HOSPITAL_COMMUNITY): Payer: Self-pay | Admitting: Surgery

## 2013-08-01 DIAGNOSIS — I4891 Unspecified atrial fibrillation: Secondary | ICD-10-CM

## 2013-08-01 DIAGNOSIS — I749 Embolism and thrombosis of unspecified artery: Secondary | ICD-10-CM

## 2013-08-01 DIAGNOSIS — Z5181 Encounter for therapeutic drug level monitoring: Secondary | ICD-10-CM

## 2013-08-01 LAB — POCT INR: INR: 1.3

## 2013-08-01 MED ORDER — ONDANSETRON HCL 4 MG PO TABS: 4.0000 mg | ORAL_TABLET | Freq: Three times a day (TID) | ORAL | Status: DC | PRN

## 2013-08-01 MED ORDER — ONDANSETRON 4 MG PO TBDP: 4.0000 mg | ORAL_TABLET | Freq: Three times a day (TID) | ORAL | Status: DC | PRN

## 2013-08-01 NOTE — Telephone Encounter (Signed)
I put in a prescription for Zofran.  It would be unusual to expect nausea and vomiting this far after surgery.  There may be another cause.  If it keeps up, he should be checked in Urgent Office.

## 2013-08-01 NOTE — Telephone Encounter (Signed)
Patient calling into the office to report that he's having nausea with vomiting.  Patient reports that at first, he thought his nausea & vomiting was due to taking his pain medication on an empty stomach.  Patient started taking his pain medication with food, still having nausea with vomiting.  Patient denies having any fevers or chills.  Patient denies difficulty with voiding.  Patient states he's taking stool softeners for constipation.  Patient reports that he had nausea and vomiting 2-3 times prior to hernia repair surgery.  Patient went to the [facility] Clinic this morning at 8:15 am and reports vomiting 2 times today.  Patient would like something for nausea.  Patient pharmacy [facility] on [facility].  Patient status post Right Inguinal hernia Repair on 07/28/13.  Patient has post op appointment on 08/28/13 @ 8:30 am.  Patient aware message will be forwarded to Dr. Georgette Dover and his assistant for review.

## 2013-08-01 NOTE — Telephone Encounter (Signed)
Called patient to let him know that Dr. Ninfa Linden sent an Rx for Zofran to his pharmacy. It was sent to [place] though epic. Called patient [facility]

## 2013-08-01 NOTE — Telephone Encounter (Signed)
Called and spoke to patient to make aware if his nausea and vomiting continue he needs to be seen in Urgent Office for further evaluation.  Patient states he will try the Zofran and call our office if nausea or vomiting does not resolve.  Pateint verbalized understanding.

## 2013-08-03 ENCOUNTER — Other Ambulatory Visit (INDEPENDENT_AMBULATORY_CARE_PROVIDER_SITE_OTHER): Payer: Self-pay | Admitting: Surgery

## 2013-08-03 ENCOUNTER — Telehealth (INDEPENDENT_AMBULATORY_CARE_PROVIDER_SITE_OTHER): Payer: Self-pay | Admitting: General Surgery

## 2013-08-03 ENCOUNTER — Ambulatory Visit (INDEPENDENT_AMBULATORY_CARE_PROVIDER_SITE_OTHER): Payer: Medicare Other | Admitting: Pharmacist

## 2013-08-03 DIAGNOSIS — Z9889 Other specified postprocedural states: Principal | ICD-10-CM

## 2013-08-03 DIAGNOSIS — Z8719 Personal history of other diseases of the digestive system: Secondary | ICD-10-CM

## 2013-08-03 DIAGNOSIS — I749 Embolism and thrombosis of unspecified artery: Secondary | ICD-10-CM

## 2013-08-03 DIAGNOSIS — Z5181 Encounter for therapeutic drug level monitoring: Secondary | ICD-10-CM

## 2013-08-03 DIAGNOSIS — I4891 Unspecified atrial fibrillation: Secondary | ICD-10-CM

## 2013-08-03 LAB — POCT INR: INR: 1.7

## 2013-08-03 MED ORDER — OXYCODONE-ACETAMINOPHEN 5-325 MG PO TABS: ORAL_TABLET | ORAL | Status: DC

## 2013-08-03 NOTE — Telephone Encounter (Signed)
LMOM advising pt that we have a written rx for refill on Oxycodone 5/325mg  #40 signed by Dr Marcello Moores for Dr Georgette Dover ready for p/u at front desk.

## 2013-08-03 NOTE — Addendum Note (Signed)
Addended by: Illene Regulus on: 08/03/2013 04:39 PM   Modules accepted: Orders

## 2013-08-03 NOTE — Telephone Encounter (Signed)
Pt calling in s/p RIH repair done 07/28/13 by Dr Georgette Dover requesting refill on Oxycodone 5/325mg  before the weekend b/c he will be completely out of medicine after Friday. The pt has a f/u appt with Dr Georgette Dover on 08/28/13. Please advise.

## 2013-08-03 NOTE — Telephone Encounter (Signed)
Called patient to let him know that by law that we can not mail out pain medication. Patient understood and he will come tomorrow to pick up the Rx  Oxycodone 5/325. 1 tab by month every 4 hours as needed for severe pain #40. The Rx is at the front desk

## 2013-08-07 ENCOUNTER — Ambulatory Visit (INDEPENDENT_AMBULATORY_CARE_PROVIDER_SITE_OTHER): Payer: Medicare Other | Admitting: Pharmacist

## 2013-08-07 DIAGNOSIS — I4891 Unspecified atrial fibrillation: Secondary | ICD-10-CM

## 2013-08-07 DIAGNOSIS — Z5181 Encounter for therapeutic drug level monitoring: Secondary | ICD-10-CM

## 2013-08-07 DIAGNOSIS — I749 Embolism and thrombosis of unspecified artery: Secondary | ICD-10-CM

## 2013-08-07 LAB — POCT INR: INR: 2

## 2013-08-18 ENCOUNTER — Ambulatory Visit (INDEPENDENT_AMBULATORY_CARE_PROVIDER_SITE_OTHER): Payer: Medicare Other | Admitting: Pharmacist

## 2013-08-18 DIAGNOSIS — I749 Embolism and thrombosis of unspecified artery: Secondary | ICD-10-CM

## 2013-08-18 DIAGNOSIS — Z5181 Encounter for therapeutic drug level monitoring: Secondary | ICD-10-CM

## 2013-08-18 DIAGNOSIS — I4891 Unspecified atrial fibrillation: Secondary | ICD-10-CM

## 2013-08-18 LAB — POCT INR: INR: 2.8

## 2013-08-28 ENCOUNTER — Encounter (INDEPENDENT_AMBULATORY_CARE_PROVIDER_SITE_OTHER): Payer: Self-pay | Admitting: Surgery

## 2013-08-28 ENCOUNTER — Ambulatory Visit (INDEPENDENT_AMBULATORY_CARE_PROVIDER_SITE_OTHER): Payer: Medicare Other | Admitting: Thoracic Surgery (Cardiothoracic Vascular Surgery)

## 2013-08-28 ENCOUNTER — Ambulatory Visit (INDEPENDENT_AMBULATORY_CARE_PROVIDER_SITE_OTHER): Payer: Medicare Other | Admitting: Surgery

## 2013-08-28 ENCOUNTER — Encounter: Payer: Self-pay | Admitting: Thoracic Surgery (Cardiothoracic Vascular Surgery)

## 2013-08-28 VITALS — BP 144/92 | HR 78 | Temp 98.2°F | Resp 16 | Ht 76.0 in | Wt 248.4 lb

## 2013-08-28 VITALS — BP 156/97 | HR 78 | Resp 16 | Ht 76.0 in | Wt 248.0 lb

## 2013-08-28 DIAGNOSIS — I4819 Other persistent atrial fibrillation: Secondary | ICD-10-CM

## 2013-08-28 DIAGNOSIS — I4891 Unspecified atrial fibrillation: Secondary | ICD-10-CM

## 2013-08-28 DIAGNOSIS — K4091 Unilateral inguinal hernia, without obstruction or gangrene, recurrent: Secondary | ICD-10-CM

## 2013-08-28 NOTE — Progress Notes (Signed)
      [address]       [place] [address]             [phone]     CARDIOTHORACIC SURGERY OFFICE NOTE  Referring Provider is Thompson Grayer, MD PCP is Nilda Simmer, MD   HPI:  Patient returns for followup of recurrent persistent atrial fibrillation. He was originally seen in consultation on 04/26/2013 and he was seen more recently on 06/05/2013.  He has been maintaining sinus rhythm on amiodarone ever since he underwent cardioversion 04/24/2013.  He was seen in followup by Dr. Rayann Heman on July 13 and 15. At that time metoprolol was discontinued because of bradycardia. Since then the patient reports feeling much better. He has lost 25 pounds in weight, been exercising regularly, and he feels quite well. Two weeks ago he underwent open repair of right inguinal hernia. He has recovered from this surgery uneventfully. He feels quite good. He denies any history of tachypalpitations. He has not had problems with exertional shortness of breath.  He remains chronically anticoagulated using warfarin.   Current Outpatient Prescriptions  Medication Sig Dispense Refill  . amiodarone (PACERONE) 200 MG tablet Take 1 tablet (200 mg total) by mouth daily.  30 tablet  3  . escitalopram (LEXAPRO) 20 MG tablet Take 20 mg by mouth daily.      Marland Kitchen gabapentin (NEURONTIN) 800 MG tablet Take 400-800 mg by mouth 2 (two) times daily. Takes 1 - 1 tablets daily as needed at dinner, and takes 1 tablet every night at bedtime.      . Naproxen Sodium (ALEVE) 220 MG CAPS Take 1 capsule by mouth daily as needed (for pain).       . tamsulosin (FLOMAX) 0.4 MG CAPS capsule Take 0.8 mg by mouth daily.      Marland Kitchen warfarin (COUMADIN) 5 MG tablet Take 2.5-5 mg by mouth daily. Takes 2.5mg  every Tuesday, Thursday, and Saturday.  Takes 5mg  on all remaining days.       No current facility-administered medications for this visit.      Physical Exam:   BP 156/97  Pulse 78  Resp 16  Ht 6\' 4"  (1.93 m)  Wt 248 lb  (112.492 kg)  BMI 30.20 kg/m2  SpO2 97%  General:  Well-appearing  Chest:   Clear to auscultation  CV:   Regular rate and rhythm without murmur  Abdomen:  Soft and nontender  Extremities:  Warm and well-perfused  Diagnostic Tests:  2 channel telemetry rhythm strip demonstrates normal sinus rhythm   Impression:  Patient is clinically doing very well maintaining sinus rhythm on oral amiodarone. He feels much better and reports normal exercise tolerance ever since he was taken off of metoprolol. He has lost 25 pounds in weight and this has unquestionably helped his situation.  Plan:  The patient will continue to followup with Dr. Rayann Heman in atrial fibrillation clinic. We will be happy to see him back again in followup as needed in the future should he develop recurrence of symptomatic atrial fibrillation.  I spent in excess of 15 minutes during the conduct of this office consultation and >50% of this time involved direct face-to-face encounter with the patient for counseling and/or coordination of their care.    Valentina Gu. Roxy Manns, MD 08/28/2013 12:47 PM

## 2013-08-28 NOTE — Progress Notes (Signed)
Status post open repair of recurrent right inguinal hernia on 07/28/13. He had an indirect hernia as well as a tear in his external oblique fascia. He is doing quite well. The swelling is receiving. No sign of recurrent hernia. His incision is healing well without signs of infection. He may begin slowly increasing his level of activity over the next 2 weeks. This may then resume full activity. Followup when necessary.  Imogene Burn. Georgette Dover, MD, [facility] Surgery  General/ Trauma Surgery  08/28/2013 8:49 AM

## 2013-09-07 ENCOUNTER — Ambulatory Visit (INDEPENDENT_AMBULATORY_CARE_PROVIDER_SITE_OTHER): Payer: Medicare Other | Admitting: *Deleted

## 2013-09-07 DIAGNOSIS — Z5181 Encounter for therapeutic drug level monitoring: Secondary | ICD-10-CM

## 2013-09-07 DIAGNOSIS — I749 Embolism and thrombosis of unspecified artery: Secondary | ICD-10-CM

## 2013-09-07 DIAGNOSIS — I4891 Unspecified atrial fibrillation: Secondary | ICD-10-CM

## 2013-09-07 LAB — POCT INR: INR: 3.5

## 2013-09-26 ENCOUNTER — Telehealth (INDEPENDENT_AMBULATORY_CARE_PROVIDER_SITE_OTHER): Payer: Self-pay

## 2013-09-26 NOTE — Telephone Encounter (Signed)
Pt did not pickup Rx for Oxycodone 5/325mg . Rx was shredded at time.

## 2013-09-28 ENCOUNTER — Ambulatory Visit (INDEPENDENT_AMBULATORY_CARE_PROVIDER_SITE_OTHER): Payer: Medicare Other

## 2013-09-28 DIAGNOSIS — I4891 Unspecified atrial fibrillation: Secondary | ICD-10-CM

## 2013-09-28 DIAGNOSIS — I749 Embolism and thrombosis of unspecified artery: Secondary | ICD-10-CM

## 2013-09-28 DIAGNOSIS — Z5181 Encounter for therapeutic drug level monitoring: Secondary | ICD-10-CM

## 2013-09-28 LAB — POCT INR: INR: 3.1

## 2013-10-19 ENCOUNTER — Ambulatory Visit (INDEPENDENT_AMBULATORY_CARE_PROVIDER_SITE_OTHER): Payer: Medicare Other | Admitting: Pharmacist

## 2013-10-19 DIAGNOSIS — Z5181 Encounter for therapeutic drug level monitoring: Secondary | ICD-10-CM

## 2013-10-19 DIAGNOSIS — I4891 Unspecified atrial fibrillation: Secondary | ICD-10-CM

## 2013-10-19 DIAGNOSIS — I749 Embolism and thrombosis of unspecified artery: Secondary | ICD-10-CM

## 2013-10-19 LAB — POCT INR: INR: 2.2

## 2013-11-02 ENCOUNTER — Other Ambulatory Visit: Payer: Self-pay | Admitting: Internal Medicine

## 2013-11-16 ENCOUNTER — Ambulatory Visit (INDEPENDENT_AMBULATORY_CARE_PROVIDER_SITE_OTHER): Payer: Medicare Other | Admitting: Pharmacist Clinician (PhC)/ Clinical Pharmacy Specialist

## 2013-11-16 DIAGNOSIS — I4891 Unspecified atrial fibrillation: Secondary | ICD-10-CM

## 2013-11-16 DIAGNOSIS — I749 Embolism and thrombosis of unspecified artery: Secondary | ICD-10-CM

## 2013-11-16 DIAGNOSIS — Z5181 Encounter for therapeutic drug level monitoring: Secondary | ICD-10-CM

## 2013-11-16 LAB — POCT INR: INR: 1.5

## 2013-12-05 ENCOUNTER — Ambulatory Visit (INDEPENDENT_AMBULATORY_CARE_PROVIDER_SITE_OTHER): Payer: Medicare Other

## 2013-12-05 DIAGNOSIS — I4891 Unspecified atrial fibrillation: Secondary | ICD-10-CM

## 2013-12-05 DIAGNOSIS — Z5181 Encounter for therapeutic drug level monitoring: Secondary | ICD-10-CM

## 2013-12-05 DIAGNOSIS — I749 Embolism and thrombosis of unspecified artery: Secondary | ICD-10-CM

## 2013-12-05 LAB — POCT INR: INR: 1.9

## 2013-12-12 DIAGNOSIS — F411 Generalized anxiety disorder: Secondary | ICD-10-CM | POA: Insufficient documentation

## 2013-12-26 ENCOUNTER — Ambulatory Visit (INDEPENDENT_AMBULATORY_CARE_PROVIDER_SITE_OTHER): Payer: Medicare Other | Admitting: *Deleted

## 2013-12-26 DIAGNOSIS — I4891 Unspecified atrial fibrillation: Secondary | ICD-10-CM

## 2013-12-26 DIAGNOSIS — I749 Embolism and thrombosis of unspecified artery: Secondary | ICD-10-CM

## 2013-12-26 DIAGNOSIS — Z5181 Encounter for therapeutic drug level monitoring: Secondary | ICD-10-CM

## 2013-12-26 LAB — POCT INR: INR: 1.3

## 2014-01-18 ENCOUNTER — Ambulatory Visit (INDEPENDENT_AMBULATORY_CARE_PROVIDER_SITE_OTHER): Payer: Medicare Other

## 2014-01-18 DIAGNOSIS — Z5181 Encounter for therapeutic drug level monitoring: Secondary | ICD-10-CM

## 2014-01-18 DIAGNOSIS — I749 Embolism and thrombosis of unspecified artery: Secondary | ICD-10-CM

## 2014-01-18 DIAGNOSIS — I4891 Unspecified atrial fibrillation: Secondary | ICD-10-CM

## 2014-01-18 LAB — POCT INR: INR: 2.1

## 2014-02-01 ENCOUNTER — Ambulatory Visit (INDEPENDENT_AMBULATORY_CARE_PROVIDER_SITE_OTHER): Payer: Medicare Other | Admitting: *Deleted

## 2014-02-01 DIAGNOSIS — Z5181 Encounter for therapeutic drug level monitoring: Secondary | ICD-10-CM

## 2014-02-01 DIAGNOSIS — I4891 Unspecified atrial fibrillation: Secondary | ICD-10-CM

## 2014-02-01 DIAGNOSIS — I749 Embolism and thrombosis of unspecified artery: Secondary | ICD-10-CM

## 2014-02-01 LAB — POCT INR: INR: 2

## 2014-02-08 ENCOUNTER — Telehealth: Payer: Self-pay | Admitting: Neurology

## 2014-02-08 ENCOUNTER — Ambulatory Visit (INDEPENDENT_AMBULATORY_CARE_PROVIDER_SITE_OTHER): Payer: Medicare Other | Admitting: Neurology

## 2014-02-08 ENCOUNTER — Encounter: Payer: Self-pay | Admitting: Neurology

## 2014-02-08 VITALS — BP 138/86 | HR 64 | Resp 16 | Ht 76.0 in | Wt 250.4 lb

## 2014-02-08 DIAGNOSIS — I4819 Other persistent atrial fibrillation: Secondary | ICD-10-CM

## 2014-02-08 DIAGNOSIS — G4733 Obstructive sleep apnea (adult) (pediatric): Secondary | ICD-10-CM | POA: Diagnosis not present

## 2014-02-08 DIAGNOSIS — I481 Persistent atrial fibrillation: Secondary | ICD-10-CM

## 2014-02-08 DIAGNOSIS — G2581 Restless legs syndrome: Secondary | ICD-10-CM | POA: Diagnosis not present

## 2014-02-08 MED ORDER — GABAPENTIN 800 MG PO TABS: ORAL_TABLET | ORAL | Status: DC

## 2014-02-08 MED ORDER — DIAZEPAM 5 MG PO TABS: 5.0000 mg | ORAL_TABLET | Freq: Every day | ORAL | Status: DC

## 2014-02-08 NOTE — Telephone Encounter (Signed)
Rx. Up front for pt to p/u/fim

## 2014-02-08 NOTE — Progress Notes (Signed)
[name]  PATIENT: Brandon [name] DOB: 25-Jun-1950  REFERRING CLINICIAN: Scarlette Ar  HISTORY FROM: Patient  REASON FOR VISIT: OSA and RLS   HISTORICAL  CHIEF COMPLAINT:  Chief Complaint  Patient presents with  . Sleep Apnea    He had an in-lab sleep study at [facility], and wa dx with osa.  He received a cpap machine from [facility]--but was not able to adjust to it, so he turned it back in.  He is not eligible for a new cpap machine without a new sleep study, and possibly not even with a new sleep study.  Dr. Felecia Shelling ordered a home sleep study to be done thru [facility], and pt. made mult appts, but for different reasons, sts. these appts were not completed./fim  . Sleep Apnea    He is here today with continued c/o difficulty staying asleep, partly due to frequent bathroom trips--he is on Flomax for his prostate./fim  . restless legs    Sts. restless leg sx. are fairly controlled with Gabapentin./fim    HISTORY OF PRESENT ILLNESS:  Brandon [name] is 64 yo man with obstructive sleep apnea and restless leg syndrome.  He was diagnosed with OSA but had trouble getting used to CPAP and turned it in.   He wished to re-try CPAP but had issues getting a home sleep study.    When he tried CPAP, he felt mildly claustophobic and was uncomfortable so he often took mask off in middle of the night.    We discussed options.   He thinks he could tolerate CPAP better than an oral appliance.       We don't have records form [facility] at this time.     He believes I told him he had moderate OSA.   He has since lost 60 pounds.   He does not know hom much pressure was used     He also had severe RLS/PLMS.   He is doing much better and no linger notes it during the evening.    He feels it is much better at night.   He is taking diazepam with gabapentin at bedtime and tolerates these well.   He often wakes up at 1 am to urinate and will wake up again several more times to  urinate before the morning.     He sometimes has hesitancy despite urgency and is doing much better on Flomax.      REVIEW OF SYSTEMS:  Constitutional: No fevers, chills, sweats, or change in appetite Eyes: No visual changes, double vision, eye pain Ear, nose and throat: No hearing loss, ear pain, nasal congestion, sore throat Cardiovascular: No chest pain, palpitations Respiratory:  No shortness of breath at rest or with exertion.   No wheezes GastrointestinaI: No nausea, vomiting, diarrhea, abdominal pain, fecal incontinence Genitourinary:  No dysuria, urinary retention or frequency.  No nocturia. Musculoskeletal:  No neck pain, back pain Integumentary: No rash, pruritus, skin lesions Neurological: as above Psychiatric: No depression at this time.  No anxiety Endocrine: No palpitations, diaphoresis, change in appetite, change in weigh or increased thirst Hematologic/Lymphatic:  No anemia, purpura, petechiae. Allergic/Immunologic: No itchy/runny eyes, nasal congestion, recent allergic reactions, rashes  ALLERGIES: Allergies  Allergen Reactions  . Ace Inhibitors Other (See Comments)    REACTION: cough    HOME MEDICATIONS: Outpatient Prescriptions Prior to Visit  Medication Sig Dispense Refill  . amiodarone (PACERONE) 200 MG tablet Take 1 tablet (200 mg total) by mouth  daily. 30 tablet 3  . escitalopram (LEXAPRO) 20 MG tablet Take 20 mg by mouth daily.    Marland Kitchen gabapentin (NEURONTIN) 800 MG tablet Take 400-800 mg by mouth 2 (two) times daily. Takes 1 - 1 tablets daily as needed at dinner, and takes 1 tablet every night at bedtime.    . tamsulosin (FLOMAX) 0.4 MG CAPS capsule Take 0.8 mg by mouth daily.    Marland Kitchen warfarin (COUMADIN) 5 MG tablet Take as directed by [facility] 30 tablet 3  . Naproxen Sodium (ALEVE) 220 MG CAPS Take 1 capsule by mouth daily as needed (for pain).      No facility-administered medications prior to visit.    PAST MEDICAL HISTORY: Past Medical History    Diagnosis Date  . Arthritis   . Sciatica   . Anxiety   . Hypertension   . Osteoporosis   . Cataract   . Asthma     Patient and wife both dinies patient has or had asthma  . Enlarged prostate   . Left atrial enlargement   . Persistent atrial fibrillation   . Obesity   . Bilateral pulmonary embolism 3/14    admitted to [facility],  treated with Xarelto  . Chronic pain   . Thrombus of left atrial appendage 03/09/2013  . Mitral regurgitation 04/24/2013    Mild by TEE  . Fatigue   . Dyspnea on exertion   . Polydipsia   . Anxiety disorder   . Benign neoplasm of colon   . Benign prostatic hyperplasia with urinary obstruction   . Chest pain   . Encounter for long-term (current) use of other medications   . Hyperlipemia   . Hypopotassemia   . Insomnia   . Major depressive disorder, recurrent episode   . Osteoarthritis   . Periodic limb movement disorder   . SOB (shortness of breath)   . Thoracic or lumbosacral neuritis or radiculitis, unspecified   . Ventral hernia, unspecified, without mention of obstruction or gangrene     right abdominal wall  . Restless leg syndrome     takes gabapentin  . OSA (obstructive sleep apnea)     noncompliant with CPAP.  07/27/13- awaiting a CPAP,  . Hepatitis C virus     patient denies  . Obstructive sleep apnea     PAST SURGICAL HISTORY: Past Surgical History  Procedure Laterality Date  . Knee arthroscopy      left  . Colonoscopy w/ polypectomy    . Rotator cuff repair      right  . Tonsillectomy    . Cataract extraction    . Hernia repair  07/06/2011  . Replacement total knee Left   . Tee without cardioversion N/A 03/21/2012    Procedure: TRANSESOPHAGEAL ECHOCARDIOGRAM (TEE);  Surgeon: Birdie Riddle, MD;  Location: [place];  Service: Cardiovascular;  Laterality: N/A;  . Cardioversion N/A 03/21/2012    Procedure: CARDIOVERSION;  Surgeon: Birdie Riddle, MD;  Location[facility] ENDOSCOPY;  Service: Cardiovascular;  Laterality: N/A;  . Tee  without cardioversion N/A 04/24/2013    Procedure: TRANSESOPHAGEAL ECHOCARDIOGRAM (TEE);  Surgeon: Dorothy Spark, MD;  Location: [place];  Service: Cardiovascular;  Laterality: N/A;  . Cardioversion N/A 04/24/2013    Procedure: CARDIOVERSION;  Surgeon: Dorothy Spark, MD;  Location: [place];  Service: Cardiovascular;  Laterality: N/A;  . Eye surgery Right     cataract  . Femur fracture surgery    . Inguinal hernia repair Right 07/28/2013  Procedure: RIGHT INGUINAL HERNIA REPAIR;  Surgeon: Imogene Burn. Georgette Dover, MD;  Location: [place];  Service: General;  Laterality: Right;  . Insertion of mesh Right 07/28/2013    Procedure: INSERTION OF MESH;  Surgeon: Imogene Burn. Georgette Dover, MD;  Location: [place] [facility];  Service: General;  Laterality: Right;    FAMILY HISTORY: Family History  Problem Relation Age of Onset  . Cancer Father     bone  . Cancer Paternal Grandmother     ovarian  . CVA Other     Fam Hx of multiple myeloma  . Diabetes Other     Fam Hx of DM    SOCIAL HISTORY:  History   Social History  . Marital Status: Married    Spouse Name: N/A    Number of Children: N/A  . Years of Education: N/A   Occupational History  . Not on file.   Social History Main Topics  . Smoking status: Former Research scientist (life sciences)  . Smokeless tobacco: Never Used  . Alcohol Use: 0.0 oz/week    0 Not specified per week     Comment: 1-2 drinks per week  . Drug Use: No     Comment: negative hx for IV drug abuse  . Sexual Activity: Not on file   Other Topics Concern  . 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   General: The patient is well-developed and well-nourished and in no acute distress  Neck: The neck is supple, no carotid bruits are  noted.  The neck is nontender.   Pharynx is Mallampati 1  Cardiovascular: The cardiovascular examination reveals a regular rate and rhythm, no murmurs, gallops or rubs are noted.  Skin: Extremities are without significant edema.  Neurologic Exam  Mental status: The patient is alert and oriented x 3 at the time of the examination. The patient has apparent normal recent and remote memory, with an apparently normal attention span and concentration ability.   Speech is normal.  Cranial nerves: Extraocular movements are full.  Facial symmetry is present. There is good facial sensation to soft touch bilaterally.Facial strength is normal.  Trapezius and sternocleidomastoid strength is normal. No dysarthria is noted.  The tongue is midline, and the patient has symmetric elevation of the soft palate. No obvious hearing deficits are noted.  Motor:  Muscle bulk and tone are normal. Strength is  5 / 5 in all 4 extremities.   Sensory: Sensory testing is intact to touch on all 4 extremities.  Gait and station: Station and gait are normal. Tandem gait is normal. Romberg is negative.   Reflexes: Deep tendon reflexes are symmetric and normal bilaterally. Plantar responses are normal.    DIAGNOSTIC DATA (LABS, IMAGING, TESTING) - I reviewed patient records, labs, notes, testing and imaging myself where available.  Lab Results  Component Value Date   WBC 6.5 07/28/2013   HGB 14.6 07/28/2013   HCT 42.8 07/28/2013   MCV 89.9 07/28/2013   PLT 147* 07/28/2013      Component Value Date/Time   NA 144 07/28/2013 1042   K 3.4* 07/28/2013 1042   CL 99 07/28/2013 1042   CO2 30 07/28/2013  1042   GLUCOSE 111* 07/28/2013 1042   BUN 13 07/28/2013 1042   CREATININE 0.85 07/28/2013 1042   CALCIUM 9.1 07/28/2013 1042   PROT 7.3 07/28/2013 1042   ALBUMIN 4.5 07/28/2013 1042   AST 59* 07/28/2013 1042   ALT 61* 07/28/2013 1042   ALKPHOS 70 07/28/2013 1042   BILITOT 0.7 07/28/2013 1042   GFRNONAA 89*  07/28/2013 1042   GFRAA >90 07/28/2013 1042   Lab Results  Component Value Date   CHOL 170 03/17/2012   HDL 30* 03/17/2012   LDLCALC 117* 03/17/2012   TRIG 117 03/17/2012   CHOLHDL 5.7 03/17/2012   Lab Results  Component Value Date   HGBA1C 5.6 03/16/2012   No results found for: VITAMINB12 Lab Results  Component Value Date   TSH 2.88 07/17/2013   No components found for: VITAMIND     ASSESSMENT AND PLAN  Obstructive sleep apnea - Plan: Home sleep test  Atrial fibrillation, persistent  Restless leg syndrome   In summary, Mr. Lebeau is a 64 year old man obstructive sleep apnea and restless leg syndrome/periodic limb movements of sleep. He had initially had difficulty tolerating CPAP but wishes to try it again and feels he would do better this time as he is sleeping much better in general. We will set up a home sleep study and set up an AutoPap based on the findings. His restless leg syndrome/PLMS is doing very well on the combination of gabapentin in the evening and bedtime and Valium at bedtime. Continue on those medicines and I provided refills.  He will return to see me in 4 months or sooner if he has new or worsening neurologic symptoms.   Richard A. Felecia Shelling, MD, PhD 4/0/1027, 2:53 AM Certified in Neurology, Clinical Neurophysiology, Sleep Medicine, Pain Medicine and Neuroimaging  [facility] [address] [place], [place] [address] [phone]

## 2014-02-08 NOTE — Telephone Encounter (Signed)
Pt is calling back stating he got a phone call regarding a Rx would be mailed to him.  He states he will be by the office tomorrow to pick it up.

## 2014-02-15 ENCOUNTER — Ambulatory Visit (INDEPENDENT_AMBULATORY_CARE_PROVIDER_SITE_OTHER): Payer: Medicare Other | Admitting: *Deleted

## 2014-02-15 DIAGNOSIS — Z5181 Encounter for therapeutic drug level monitoring: Secondary | ICD-10-CM

## 2014-02-15 DIAGNOSIS — I749 Embolism and thrombosis of unspecified artery: Secondary | ICD-10-CM

## 2014-02-15 DIAGNOSIS — I4891 Unspecified atrial fibrillation: Secondary | ICD-10-CM

## 2014-02-15 LAB — POCT INR: INR: 2.6

## 2014-02-22 ENCOUNTER — Ambulatory Visit (INDEPENDENT_AMBULATORY_CARE_PROVIDER_SITE_OTHER): Payer: Medicare Other | Admitting: Nurse Practitioner

## 2014-02-22 ENCOUNTER — Encounter: Payer: Self-pay | Admitting: Nurse Practitioner

## 2014-02-22 ENCOUNTER — Telehealth: Payer: Self-pay | Admitting: *Deleted

## 2014-02-22 VITALS — BP 124/84 | HR 64 | Ht 75.0 in | Wt 251.4 lb

## 2014-02-22 DIAGNOSIS — I481 Persistent atrial fibrillation: Secondary | ICD-10-CM

## 2014-02-22 DIAGNOSIS — I4819 Other persistent atrial fibrillation: Secondary | ICD-10-CM

## 2014-02-22 LAB — HEPATIC FUNCTION PANEL
ALT: 18 U/L (ref 0–53)
AST: 23 U/L (ref 0–37)
Albumin: 4.1 g/dL (ref 3.5–5.2)
Alkaline Phosphatase: 62 U/L (ref 39–117)
Bilirubin, Direct: 0.1 mg/dL (ref 0.0–0.3)
Total Bilirubin: 0.6 mg/dL (ref 0.2–1.2)
Total Protein: 6.8 g/dL (ref 6.0–8.3)

## 2014-02-22 LAB — T3, FREE: T3, Free: 3 pg/mL (ref 2.3–4.2)

## 2014-02-22 LAB — TSH: TSH: 1.9 u[IU]/mL (ref 0.35–4.50)

## 2014-02-22 LAB — T4: T4, Total: 6.9 ug/dL (ref 4.5–12.0)

## 2014-02-22 NOTE — Progress Notes (Signed)
PCP:  Nilda Simmer, MD  The patient presents today for routine electrophysiology followup.  He had TEE with [facility]  in April and appears to be maintaining sinus rhythm on  Amiodarone 200 mg a day.  He is still  undergoing evaluation of sleep apnea and best way to treat that he can tolerate. He had inguinal surgery in July, and did well. Had a stress test just prior to that surgery and deemed to be low risk for surgery. He is pending left shoulder surgery in the upcoming weeks and based on no exertional symptoms/chf symptoms and low risk nuclear stress test scan less than 9 months ago, is considered low risk to proceed. He will require a lovenox bridge with history of PE with his last being in February of this year.  Chads2vasc score is at least an 3 with history of 2  PE and will f/u in the anticoagulation clinic for this. He reports no awareness of afib and is in NSR in the office today. Will need screening labs today for ongoing amiodarone use. In Dr. Jackalyn Lombard last note, he said to consider reducing the dose to 100 mg a day, if pt maintained SR, to negate long term side effects of drug. However,now, right before surgery with the stress of same may not be the best time.    Past Medical History  Diagnosis Date  . Arthritis   . Sciatica   . Anxiety   . Hypertension   . Osteoporosis   . Cataract   . Asthma     Patient and wife both dinies patient has or had asthma  . Enlarged prostate   . Left atrial enlargement   . Persistent atrial fibrillation   . Obesity   . Bilateral pulmonary embolism 3/14    admitted to [facility],  treated with Xarelto  . Chronic pain   . Thrombus of left atrial appendage 03/09/2013  . Mitral regurgitation 04/24/2013    Mild by TEE  . Fatigue   . Dyspnea on exertion   . Polydipsia   . Anxiety disorder   . Benign neoplasm of colon   . Benign prostatic hyperplasia with urinary obstruction   . Chest pain   . Encounter for long-term (current) use of other  medications   . Hyperlipemia   . Hypopotassemia   . Insomnia   . Major depressive disorder, recurrent episode   . Osteoarthritis   . Periodic limb movement disorder   . SOB (shortness of breath)   . Thoracic or lumbosacral neuritis or radiculitis, unspecified   . Ventral hernia, unspecified, without mention of obstruction or gangrene     right abdominal wall  . Restless leg syndrome     takes gabapentin  . OSA (obstructive sleep apnea)     noncompliant with CPAP.  07/27/13- awaiting a CPAP,  . Hepatitis C virus     patient denies  . Obstructive sleep apnea    Past Surgical History  Procedure Laterality Date  . Knee arthroscopy      left  . Colonoscopy w/ polypectomy    . Rotator cuff repair      right  . Tonsillectomy    . Cataract extraction    . Hernia repair  07/06/2011  . Replacement total knee Left   . Tee without cardioversion N/A 03/21/2012    Procedure: TRANSESOPHAGEAL ECHOCARDIOGRAM (TEE);  Surgeon: Birdie Riddle, MD;  Location: [place];  Service: Cardiovascular;  Laterality: N/A;  . Cardioversion  N/A 03/21/2012    Procedure: CARDIOVERSION;  Surgeon: Birdie Riddle, MD;  Location[facility] ENDOSCOPY;  Service: Cardiovascular;  Laterality: N/A;  . Tee without cardioversion N/A 04/24/2013    Procedure: TRANSESOPHAGEAL ECHOCARDIOGRAM (TEE);  Surgeon: Dorothy Spark, MD;  Location: [place];  Service: Cardiovascular;  Laterality: N/A;  . Cardioversion N/A 04/24/2013    Procedure: CARDIOVERSION;  Surgeon: Dorothy Spark, MD;  Location: [place];  Service: Cardiovascular;  Laterality: N/A;  . Eye surgery Right     cataract  . Femur fracture surgery    . Inguinal hernia repair Right 07/28/2013    Procedure: RIGHT INGUINAL HERNIA REPAIR;  Surgeon: Imogene Burn. Georgette Dover, MD;  Location: [place];  Service: General;  Laterality: Right;  . Insertion of mesh Right 07/28/2013    Procedure: INSERTION OF MESH;  Surgeon: Imogene Burn. Georgette Dover, MD;  Location: [place] [facility];  Service: General;   Laterality: Right;    Current Outpatient Prescriptions  Medication Sig Dispense Refill  . amiodarone (PACERONE) 200 MG tablet Take 1 tablet (200 mg total) by mouth daily. 30 tablet 3  . diazepam (VALIUM) 5 MG tablet Take 1 tablet (5 mg total) by mouth at bedtime. 30 tablet 5  . escitalopram (LEXAPRO) 20 MG tablet Take 20 mg by mouth daily.    Marland Kitchen gabapentin (NEURONTIN) 800 MG tablet Takes 1  tablet daily at dinner, and take 1 tablet every night at bedtime. 60 tablet 11  . HYDROcodone-acetaminophen (NORCO/VICODIN) 5-325 MG per tablet     . lidocaine (LIDODERM) 5 % 1 patch.    . Naproxen Sodium (ALEVE) 220 MG CAPS Take 1 capsule by mouth daily as needed (for pain).     . tamsulosin (FLOMAX) 0.4 MG CAPS capsule Take 0.8 mg by mouth daily.    Marland Kitchen warfarin (COUMADIN) 5 MG tablet Take as directed by [facility] 30 tablet 3   No current facility-administered medications for this visit.    Allergies  Allergen Reactions  . Ace Inhibitors Other (See Comments)    REACTION: cough    History   Social History  . Marital Status: Married    Spouse Name: N/A  . Number of Children: N/A  . Years of Education: N/A   Occupational History  . Not on file.   Social History Main Topics  . Smoking status: Former Research scientist (life sciences)  . Smokeless tobacco: Never Used  . Alcohol Use: 0.0 oz/week    0 Standard drinks or equivalent per week     Comment: 1-2 drinks per week  . Drug Use: No     Comment: negative hx for IV drug abuse  . Sexual Activity: Not on file   Other Topics Concern  . Not on file   Social History Narrative   Lives with wife in [place]   Retired Dietitian    Family History  Problem Relation Age of Onset  . Cancer Father     bone  . Cancer Paternal Grandmother     ovarian  . CVA Other     Fam Hx of multiple myeloma  . Diabetes Other     Fam Hx of DM    ROS-  All systems are reviewed and are negative except as outlined in the HPI above  Physical  Exam: Filed Vitals:   02/22/14 0919  BP: 150/98  Pulse: 64  Height: 6' 3"  (1.905 m)  Weight: 251 lb 6.4 oz (114.034 kg)    GEN- The patient is well appearing,  alert and oriented x 3 today. Head- normocephalic, atraumatic Eyes-  Sclera clear, conjunctiva pink Ears- hearing intact Oropharynx- clear Neck- supple, no JVP Lymph- no cervical lymphadenopathy Lungs- Clear to ausculation bilaterally, normal work of breathing Heart-  regular, slow rate and rhythm  GI- soft, NT, ND, + BS Extremities- no clubbing, cyanosis, or edema MS- no significant deformity or atrophy Skin- no rash or lesion Psych- euthymic mood, full affect Neuro- strength and sensation are intact  ekg today reveals  sinus rhythm at 64 bpm,cannot r/o septal infarct, unchanged from previous EKG 07/16/13  Assessment and Plan:  1. Persistent atrial fibrillation Currently maintaining sinus rhythm on amiodarone s/p recent cardioversion.  Consideration for decreasing amiodarone to 100 mg a day,since pt as far as he can tell is maintaining SR, but right before the stress of surgery not optimal timing, F/u with Dr. Rayann Heman in 3 months for further consideration.  Liver TSH, T3 and T4 today.  2. Preoperative clearance for pending Left shoulder surgery Lexi Myoview performed in July 2015 was low risk and pt does not have any exertional/heart failure symptoms. Can proceed to surgery at acceptable  risk. He will  require bridge therapy for the surgery with lovenox and will be referred to the anticoagulation clinic for bridging.    3. Sleep apnea/ fatigue.  Continue with plans for treatment of sleep apnea . Patient understands correlation of atrial fibrillation to recurrent atrial fibrillation  4. Continue warfarin long term due to history of PE on Xarelto. Chads2vas score of 2 age hypertension and with history of recurrent pulmonary emboli and previously documented LA appendage thrombus even on xarelto--> switched to coumadin and  thrombus resolved.  5. H/O LAA thrombus. Last TEE 04/24/13 did not show any evidence of thrombus in the left atrium or left atrial appendage. Continue warfarin.  6. HTN Stable Initially around 262 systolic with recheck at 035 systolic.  7. Obesity Weight loss is advised.  8.  Followup in 3 months with Dr. Rayann Heman to further discuss reduction of amiodarone dose.

## 2014-02-22 NOTE — Telephone Encounter (Signed)
Preoperative clearance letter sent to be faxed to [facility] orthopaedics

## 2014-02-22 NOTE — Patient Instructions (Signed)
Your physician recommends that you have lab work today: TSH/ T4/ free T3/ Liver  Your physician recommends that you schedule a follow-up appointment in: 3 months with Dr. Rayann Heman  Please call the office if your surgery date is scheduled prior to 03/08/14- this is the current date of your next coumadin appointment.  Your physician recommends that you continue on your current medications as directed. Please refer to the Current Medication list given to you today.

## 2014-02-23 ENCOUNTER — Telehealth: Payer: Self-pay | Admitting: Internal Medicine

## 2014-02-23 NOTE — Telephone Encounter (Signed)
Received request from Nurse fax box, documents faxed for surgical clearance. To: [employer] Fax number: [phone] Attention: 2.19.16/km

## 2014-02-27 ENCOUNTER — Telehealth: Payer: Self-pay | Admitting: *Deleted

## 2014-02-27 NOTE — Telephone Encounter (Signed)
Patient notified of normal lab results. Patient had no issues or concerns

## 2014-02-27 NOTE — Telephone Encounter (Signed)
-----   Message from Roderic Palau, NP sent at 02/22/2014  2:07 PM EST ----- Please inform of normal labs.

## 2014-02-28 NOTE — Pre-Procedure Instructions (Signed)
Jaesean Litzau.  02/28/2014   Your procedure is scheduled on: Thursday, March 08, 2014  Report to [facility] Admitting at 11:00 AM.  Call this number if you have problems the morning of surgery: [phone]   Remember: Follow doctors instructions regarding Warfarin (COUMADIN)   Do not eat food or drink liquids after midnight Wednesday, March 07, 2014   Take these medicines the morning of surgery with A SIP OF WATER:amiodarone (PACERONE)  escitalopram (LEXAPRO), tamsulosin (FLOMAX), hydrocodone if needed.  Stop taking Aspirin, Coumadin, vitamins, and herbal medications. Do not take any NSAIDs ie: Ibuprofen, Advil, Naproxen or any medication containing Aspirin.   Do not wear jewelry, make-up or nail polish.  Do not wear lotions, powders, or perfumes. You may not wear deodorant.  Do not shave 48 hours prior to surgery. Men may shave face and neck.  Do not bring valuables to the hospital.  [facility] is not responsible for any belongings or valuables.               Contacts, dentures or bridgework may not be worn into surgery.  Leave suitcase in the car. After surgery it may be brought to your room.  For patients admitted to the hospital, discharge time is determined by your treatment team.               Patients discharged the day of surgery will not be allowed to drive home.  Name and phone number of your driver:   Special Instructions:  Special Instructions:Special Instructions: [facility] - Preparing for Surgery  Before surgery, you can play an important role.  Because skin is not sterile, your skin needs to be as free of germs as possible.  You can reduce the number of germs on you skin by washing with CHG (chlorahexidine gluconate) soap before surgery.  CHG is an antiseptic cleaner which kills germs and bonds with the skin to continue killing germs even after washing.  Please DO NOT use if you have an allergy to CHG or antibacterial soaps.  If your skin becomes  reddened/irritated stop using the CHG and inform your nurse when you arrive at Short Stay.  Do not shave (including legs and underarms) for at least 48 hours prior to the first CHG shower.  You may shave your face.  Please follow these instructions carefully:   1.  Shower with CHG Soap the night before surgery and the morning of Surgery.  2.  If you choose to wash your hair, wash your hair first as usual with your normal shampoo.  3.  After you shampoo, rinse your hair and body thoroughly to remove the Shampoo.  4.  Use CHG as you would any other liquid soap.  You can apply chg directly  to the skin and wash gently with scrungie or a clean washcloth.  5.  Apply the CHG Soap to your body ONLY FROM THE NECK DOWN.  Do not use on open wounds or open sores.  Avoid contact with your eyes, ears, mouth and genitals (private parts).  Wash genitals (private parts) with your normal soap.  6.  Wash thoroughly, paying special attention to the area where your surgery will be performed.  7.  Thoroughly rinse your body with warm water from the neck down.  8.  DO NOT shower/wash with your normal soap after using and rinsing off the CHG Soap.  9.  Pat yourself dry with a clean towel.  10.  Wear clean pajamas.            11.  Place clean sheets on your bed the night of your first shower and do not sleep with pets.  Day of Surgery  Do not apply any lotions/deoderants the morning of surgery.  Please wear clean clothes to the hospital/surgery center.   Please read over the following fact sheets that you were given: Pain Booklet, Coughing and Deep Breathing and Surgical Site Infection Prevention

## 2014-03-01 ENCOUNTER — Encounter (HOSPITAL_COMMUNITY)
Admission: RE | Admit: 2014-03-01 | Discharge: 2014-03-01 | Disposition: A | Discharge status: Home / Self Care | Payer: Medicare Other | Source: Ambulatory Visit | Attending: Orthopedic Surgery | Admitting: Orthopedic Surgery

## 2014-03-01 ENCOUNTER — Encounter (HOSPITAL_COMMUNITY): Payer: Self-pay

## 2014-03-01 DIAGNOSIS — Z01812 Encounter for preprocedural laboratory examination: Secondary | ICD-10-CM | POA: Diagnosis not present

## 2014-03-01 DIAGNOSIS — M25812 Other specified joint disorders, left shoulder: Secondary | ICD-10-CM | POA: Insufficient documentation

## 2014-03-01 LAB — COMPREHENSIVE METABOLIC PANEL
ALT: 20 U/L (ref 0–53)
AST: 24 U/L (ref 0–37)
Albumin: 4.3 g/dL (ref 3.5–5.2)
Alkaline Phosphatase: 62 U/L (ref 39–117)
Anion gap: 4 — ABNORMAL LOW (ref 5–15)
BUN: 12 mg/dL (ref 6–23)
CO2: 34 mmol/L — ABNORMAL HIGH (ref 19–32)
Calcium: 9 mg/dL (ref 8.4–10.5)
Chloride: 100 mmol/L (ref 96–112)
Creatinine, Ser: 0.88 mg/dL (ref 0.50–1.35)
GFR calc Af Amer: >90 mL/min (ref >90)
GFR calc non Af Amer: 88 mL/min — ABNORMAL LOW (ref >90)
Glucose, Bld: 96 mg/dL (ref 70–99)
Potassium: 3.2 mmol/L — ABNORMAL LOW (ref 3.5–5.1)
Sodium: 138 mmol/L (ref 135–145)
Total Bilirubin: 0.8 mg/dL (ref 0.3–1.2)
Total Protein: 7.3 g/dL (ref 6.0–8.3)

## 2014-03-01 LAB — CBC WITH DIFFERENTIAL/PLATELET
Basophils Absolute: 0 10*3/uL (ref 0.0–0.1)
Basophils Relative: 1 % (ref 0–1)
Eosinophils Absolute: 0.4 10*3/uL (ref 0.0–0.7)
Eosinophils Relative: 6 % — ABNORMAL HIGH (ref 0–5)
HCT: 44.7 % (ref 39.0–52.0)
Hemoglobin: 15.3 g/dL (ref 13.0–17.0)
Lymphocytes Relative: 22 % (ref 12–46)
Lymphs Abs: 1.5 10*3/uL (ref 0.7–4.0)
MCH: 31.4 pg (ref 26.0–34.0)
MCHC: 34.2 g/dL (ref 30.0–36.0)
MCV: 91.6 fL (ref 78.0–100.0)
Monocytes Absolute: 0.7 10*3/uL (ref 0.1–1.0)
Monocytes Relative: 10 % (ref 3–12)
Neutro Abs: 4.1 10*3/uL (ref 1.7–7.7)
Neutrophils Relative %: 61 % (ref 43–77)
Platelets: 165 10*3/uL (ref 150–400)
RBC: 4.88 MIL/uL (ref 4.22–5.81)
RDW: 14.1 % (ref 11.5–15.5)
WBC: 6.7 10*3/uL (ref 4.0–10.5)

## 2014-03-02 ENCOUNTER — Ambulatory Visit (INDEPENDENT_AMBULATORY_CARE_PROVIDER_SITE_OTHER): Payer: Medicare Other

## 2014-03-02 DIAGNOSIS — I749 Embolism and thrombosis of unspecified artery: Secondary | ICD-10-CM

## 2014-03-02 DIAGNOSIS — I4891 Unspecified atrial fibrillation: Secondary | ICD-10-CM

## 2014-03-02 DIAGNOSIS — Z5181 Encounter for therapeutic drug level monitoring: Secondary | ICD-10-CM

## 2014-03-02 LAB — POCT INR: INR: 2.1

## 2014-03-02 NOTE — Patient Instructions (Addendum)
03/02/14 Take your last dosage of Coumadin prior to surgery on 03/08/14. 03/03/14 No Coumadin, No Lovenox. 03/04/14 Start taking Lovenox 120mg  once in the am, and Lovenox 120mg  once in the pm. 03/05/14 Take Lovenox 120mg  once in the am, and Lovenox 120mg  once in the pm. 03/06/14 Take Lovenox 120mg  once in the am, and Lovenox 120mg  once in the pm. 03/07/14 Take your last dosage of Lovenox 120mg  in the am, No Lovenox in the pm prior to surgery on 03/08/14. 03/08/14 Day of surgery, No Lovenox.  Resume Coumadin once ok with MD, take 1.5 tablets x 2 doses, then resume previous dosage 1 tablet daily except 1/2 tablet on Sundays.  Resume Lovenox once ok with MD, take 120mg  every 12 hrs, once in the am, and once in the pm until INR checked and 2.0 or greater.

## 2014-03-05 ENCOUNTER — Telehealth: Payer: Self-pay | Admitting: Internal Medicine

## 2014-03-05 NOTE — Telephone Encounter (Signed)
Surgical clearance faxed to [facility].Clearance handwritten on [facility] 02/20/14 office note, rmf.

## 2014-03-07 MED ORDER — LACTATED RINGERS IV SOLN
INTRAVENOUS | Status: DC
Start: 2014-03-07 — End: 2014-03-08
  Administered 2014-03-08: 11:00:00 via INTRAVENOUS

## 2014-03-07 MED ORDER — CEFAZOLIN SODIUM-DEXTROSE 2-3 GM-% IV SOLR
2.0000 g | INTRAVENOUS | Status: AC
  Administered 2014-03-08: 2 g via INTRAVENOUS
  Filled 2014-03-07: qty 50

## 2014-03-08 ENCOUNTER — Ambulatory Visit (HOSPITAL_COMMUNITY): Payer: Medicare Other | Admitting: Anesthesiology

## 2014-03-08 ENCOUNTER — Encounter (HOSPITAL_COMMUNITY): Payer: Self-pay | Admitting: Anesthesiology

## 2014-03-08 ENCOUNTER — Observation Stay (HOSPITAL_COMMUNITY)
Admission: RE | Admit: 2014-03-08 | Discharge: 2014-03-09 | Disposition: A | Discharge status: Home / Self Care | Payer: Medicare Other | Source: Ambulatory Visit | Attending: Orthopedic Surgery | Admitting: Orthopedic Surgery

## 2014-03-08 ENCOUNTER — Encounter (HOSPITAL_COMMUNITY)
Admission: RE | Disposition: A | Discharge status: Home / Self Care | Payer: Self-pay | Source: Ambulatory Visit | Attending: Orthopedic Surgery

## 2014-03-08 DIAGNOSIS — Z9119 Patient's noncompliance with other medical treatment and regimen: Secondary | ICD-10-CM | POA: Diagnosis not present

## 2014-03-08 DIAGNOSIS — Z86711 Personal history of pulmonary embolism: Secondary | ICD-10-CM | POA: Diagnosis not present

## 2014-03-08 DIAGNOSIS — Z87891 Personal history of nicotine dependence: Secondary | ICD-10-CM | POA: Insufficient documentation

## 2014-03-08 DIAGNOSIS — M1990 Unspecified osteoarthritis, unspecified site: Secondary | ICD-10-CM | POA: Insufficient documentation

## 2014-03-08 DIAGNOSIS — F419 Anxiety disorder, unspecified: Secondary | ICD-10-CM | POA: Diagnosis not present

## 2014-03-08 DIAGNOSIS — N400 Enlarged prostate without lower urinary tract symptoms: Secondary | ICD-10-CM | POA: Insufficient documentation

## 2014-03-08 DIAGNOSIS — I10 Essential (primary) hypertension: Secondary | ICD-10-CM | POA: Diagnosis not present

## 2014-03-08 DIAGNOSIS — G4733 Obstructive sleep apnea (adult) (pediatric): Secondary | ICD-10-CM | POA: Diagnosis not present

## 2014-03-08 DIAGNOSIS — I481 Persistent atrial fibrillation: Secondary | ICD-10-CM | POA: Insufficient documentation

## 2014-03-08 DIAGNOSIS — M94212 Chondromalacia, left shoulder: Secondary | ICD-10-CM | POA: Diagnosis not present

## 2014-03-08 DIAGNOSIS — M659 Synovitis and tenosynovitis, unspecified: Secondary | ICD-10-CM | POA: Diagnosis not present

## 2014-03-08 DIAGNOSIS — E785 Hyperlipidemia, unspecified: Secondary | ICD-10-CM | POA: Diagnosis not present

## 2014-03-08 DIAGNOSIS — M7542 Impingement syndrome of left shoulder: Secondary | ICD-10-CM | POA: Diagnosis not present

## 2014-03-08 DIAGNOSIS — G2581 Restless legs syndrome: Secondary | ICD-10-CM | POA: Insufficient documentation

## 2014-03-08 DIAGNOSIS — E669 Obesity, unspecified: Secondary | ICD-10-CM | POA: Diagnosis not present

## 2014-03-08 DIAGNOSIS — M75102 Unspecified rotator cuff tear or rupture of left shoulder, not specified as traumatic: Secondary | ICD-10-CM | POA: Diagnosis not present

## 2014-03-08 DIAGNOSIS — Z79899 Other long term (current) drug therapy: Secondary | ICD-10-CM | POA: Insufficient documentation

## 2014-03-08 DIAGNOSIS — M75100 Unspecified rotator cuff tear or rupture of unspecified shoulder, not specified as traumatic: Secondary | ICD-10-CM | POA: Diagnosis present

## 2014-03-08 HISTORY — PX: SHOULDER ARTHROSCOPY WITH ROTATOR CUFF REPAIR AND SUBACROMIAL DECOMPRESSION: SHX5686

## 2014-03-08 HISTORY — PX: ROTATOR CUFF REPAIR: SHX139

## 2014-03-08 LAB — APTT: aPTT: 34 seconds (ref 24–37)

## 2014-03-08 LAB — PROTIME-INR
INR: 1.17 (ref 0.00–1.49)
Prothrombin Time: 15.1 seconds (ref 11.6–15.2)

## 2014-03-08 LAB — CREATININE, SERUM
Creatinine, Ser: 0.69 mg/dL (ref 0.50–1.35)
GFR calc Af Amer: >90 mL/min (ref >90)
GFR calc non Af Amer: >90 mL/min (ref >90)

## 2014-03-08 SURGERY — SHOULDER ARTHROSCOPY WITH ROTATOR CUFF REPAIR AND SUBACROMIAL DECOMPRESSION
Anesthesia: General | Site: Shoulder | Laterality: Left

## 2014-03-08 MED ORDER — ROCURONIUM BROMIDE 50 MG/5ML IV SOLN
INTRAVENOUS | Status: AC
  Filled 2014-03-08: qty 1

## 2014-03-08 MED ORDER — ONDANSETRON HCL 4 MG/2ML IJ SOLN
4.0000 mg | Freq: Four times a day (QID) | INTRAMUSCULAR | Status: DC | PRN
  Administered 2014-03-08: 4 mg via INTRAVENOUS
  Filled 2014-03-08: qty 2

## 2014-03-08 MED ORDER — ENOXAPARIN SODIUM 120 MG/0.8ML ~~LOC~~ SOLN
1.0000 mg/kg | SUBCUTANEOUS | Status: DC
  Administered 2014-03-09: 115 mg via SUBCUTANEOUS
  Filled 2014-03-08 (×2): qty 0.8

## 2014-03-08 MED ORDER — MENTHOL 3 MG MT LOZG: 1.0000 | LOZENGE | OROMUCOSAL | Status: DC | PRN

## 2014-03-08 MED ORDER — HYDROMORPHONE HCL 1 MG/ML IJ SOLN
INTRAMUSCULAR | Status: AC
  Filled 2014-03-08: qty 1

## 2014-03-08 MED ORDER — SODIUM CHLORIDE 0.9 % IR SOLN
Status: DC | PRN
  Administered 2014-03-08: 6000 mL
  Administered 2014-03-08: 1000 mL

## 2014-03-08 MED ORDER — ONDANSETRON HCL 4 MG/2ML IJ SOLN
INTRAMUSCULAR | Status: DC | PRN
  Administered 2014-03-08: 4 mg via INTRAVENOUS

## 2014-03-08 MED ORDER — BISACODYL 5 MG PO TBEC: 5.0000 mg | DELAYED_RELEASE_TABLET | Freq: Every day | ORAL | Status: DC | PRN

## 2014-03-08 MED ORDER — DIPHENHYDRAMINE HCL 12.5 MG/5ML PO ELIX: 12.5000 mg | ORAL_SOLUTION | ORAL | Status: DC | PRN

## 2014-03-08 MED ORDER — ROCURONIUM BROMIDE 100 MG/10ML IV SOLN
INTRAVENOUS | Status: DC | PRN
  Administered 2014-03-08: 50 mg via INTRAVENOUS

## 2014-03-08 MED ORDER — PROMETHAZINE HCL 25 MG/ML IJ SOLN
INTRAMUSCULAR | Status: AC
  Filled 2014-03-08: qty 1

## 2014-03-08 MED ORDER — ACETAMINOPHEN 325 MG PO TABS: 650.0000 mg | ORAL_TABLET | Freq: Four times a day (QID) | ORAL | Status: DC | PRN

## 2014-03-08 MED ORDER — MIDAZOLAM HCL 2 MG/2ML IJ SOLN
INTRAMUSCULAR | Status: AC
  Administered 2014-03-08: 2 mg
  Filled 2014-03-08: qty 2

## 2014-03-08 MED ORDER — FENTANYL CITRATE 0.05 MG/ML IJ SOLN
INTRAMUSCULAR | Status: AC
  Administered 2014-03-08: 100 ug
  Filled 2014-03-08: qty 2

## 2014-03-08 MED ORDER — ESCITALOPRAM OXALATE 10 MG PO TABS
20.0000 mg | ORAL_TABLET | Freq: Every day | ORAL | Status: DC
  Administered 2014-03-09: 20 mg via ORAL
  Filled 2014-03-08: qty 2

## 2014-03-08 MED ORDER — OXYCODONE HCL 5 MG/5ML PO SOLN: 5.0000 mg | Freq: Once | ORAL | Status: DC | PRN

## 2014-03-08 MED ORDER — PHENOL 1.4 % MT LIQD: 1.0000 | OROMUCOSAL | Status: DC | PRN

## 2014-03-08 MED ORDER — WARFARIN SODIUM 5 MG PO TABS: 2.5000 mg | ORAL_TABLET | ORAL | Status: DC

## 2014-03-08 MED ORDER — PROPOFOL 10 MG/ML IV BOLUS
INTRAVENOUS | Status: DC | PRN
Start: 2014-03-08 — End: 2014-03-08
  Administered 2014-03-08: 200 mg via INTRAVENOUS

## 2014-03-08 MED ORDER — FENTANYL CITRATE 0.05 MG/ML IJ SOLN
INTRAMUSCULAR | Status: DC | PRN
  Administered 2014-03-08 (×2): 75 ug via INTRAVENOUS

## 2014-03-08 MED ORDER — TAMSULOSIN HCL 0.4 MG PO CAPS
0.8000 mg | ORAL_CAPSULE | Freq: Every day | ORAL | Status: DC
  Administered 2014-03-09: 0.8 mg via ORAL
  Filled 2014-03-08: qty 2

## 2014-03-08 MED ORDER — OXYCODONE HCL 5 MG PO TABS: 5.0000 mg | ORAL_TABLET | Freq: Once | ORAL | Status: DC | PRN

## 2014-03-08 MED ORDER — LACTATED RINGERS IV SOLN
INTRAVENOUS | Status: DC
  Administered 2014-03-08: 16:00:00 via INTRAVENOUS

## 2014-03-08 MED ORDER — HYDROMORPHONE HCL 1 MG/ML IJ SOLN
0.2500 mg | INTRAMUSCULAR | Status: DC | PRN
  Administered 2014-03-08 (×2): 0.5 mg via INTRAVENOUS

## 2014-03-08 MED ORDER — WARFARIN SODIUM 5 MG PO TABS: 2.5000 mg | ORAL_TABLET | Freq: Every day | ORAL | Status: DC

## 2014-03-08 MED ORDER — METOCLOPRAMIDE HCL 5 MG/ML IJ SOLN
5.0000 mg | Freq: Three times a day (TID) | INTRAMUSCULAR | Status: DC | PRN
  Administered 2014-03-08: 5 mg via INTRAVENOUS
  Filled 2014-03-08: qty 2

## 2014-03-08 MED ORDER — NEOSTIGMINE METHYLSULFATE 10 MG/10ML IV SOLN
INTRAVENOUS | Status: DC | PRN
  Administered 2014-03-08: 3 mg via INTRAVENOUS

## 2014-03-08 MED ORDER — MAGNESIUM CITRATE PO SOLN: 1.0000 | Freq: Once | ORAL | Status: AC | PRN

## 2014-03-08 MED ORDER — LACTATED RINGERS IV SOLN
INTRAVENOUS | Status: DC
  Administered 2014-03-08: 11:00:00 via INTRAVENOUS

## 2014-03-08 MED ORDER — GLYCOPYRROLATE 0.2 MG/ML IJ SOLN
INTRAMUSCULAR | Status: AC
  Filled 2014-03-08: qty 4

## 2014-03-08 MED ORDER — PROMETHAZINE HCL 25 MG/ML IJ SOLN
6.2500 mg | INTRAMUSCULAR | Status: DC | PRN
  Administered 2014-03-08: 6.25 mg via INTRAVENOUS

## 2014-03-08 MED ORDER — AMIODARONE HCL 100 MG PO TABS
200.0000 mg | ORAL_TABLET | Freq: Every day | ORAL | Status: DC
  Administered 2014-03-09: 200 mg via ORAL
  Filled 2014-03-08: qty 2

## 2014-03-08 MED ORDER — WARFARIN - PHYSICIAN DOSING INPATIENT: Freq: Every day | Status: DC

## 2014-03-08 MED ORDER — HYDROMORPHONE HCL 1 MG/ML IJ SOLN
1.0000 mg | INTRAMUSCULAR | Status: DC | PRN
  Administered 2014-03-08 – 2014-03-09 (×2): 1 mg via INTRAVENOUS
  Filled 2014-03-08 (×2): qty 1

## 2014-03-08 MED ORDER — FENTANYL CITRATE 0.05 MG/ML IJ SOLN
INTRAMUSCULAR | Status: AC
Start: 2014-03-08 — End: 2014-03-08
  Filled 2014-03-08: qty 5

## 2014-03-08 MED ORDER — LIDOCAINE HCL (CARDIAC) 20 MG/ML IV SOLN
INTRAVENOUS | Status: DC | PRN
  Administered 2014-03-08: 70 mg via INTRAVENOUS

## 2014-03-08 MED ORDER — POLYETHYLENE GLYCOL 3350 17 G PO PACK: 17.0000 g | PACK | Freq: Every day | ORAL | Status: DC | PRN

## 2014-03-08 MED ORDER — ACETAMINOPHEN 650 MG RE SUPP: 650.0000 mg | Freq: Four times a day (QID) | RECTAL | Status: DC | PRN

## 2014-03-08 MED ORDER — ONDANSETRON HCL 4 MG PO TABS: 4.0000 mg | ORAL_TABLET | Freq: Four times a day (QID) | ORAL | Status: DC | PRN

## 2014-03-08 MED ORDER — DIAZEPAM 5 MG PO TABS
5.0000 mg | ORAL_TABLET | Freq: Four times a day (QID) | ORAL | Status: DC | PRN
  Administered 2014-03-09: 5 mg via ORAL
  Filled 2014-03-08: qty 1

## 2014-03-08 MED ORDER — GLYCOPYRROLATE 0.2 MG/ML IJ SOLN
INTRAMUSCULAR | Status: DC | PRN
  Administered 2014-03-08 (×2): 0.2 mg via INTRAVENOUS
  Administered 2014-03-08: 0.4 mg via INTRAVENOUS

## 2014-03-08 MED ORDER — DOCUSATE SODIUM 100 MG PO CAPS
100.0000 mg | ORAL_CAPSULE | Freq: Two times a day (BID) | ORAL | Status: DC
  Administered 2014-03-08 – 2014-03-09 (×2): 100 mg via ORAL
  Filled 2014-03-08 (×2): qty 1

## 2014-03-08 MED ORDER — BUPIVACAINE-EPINEPHRINE (PF) 0.5% -1:200000 IJ SOLN
INTRAMUSCULAR | Status: DC | PRN
  Administered 2014-03-08: 30 mL via PERINEURAL

## 2014-03-08 MED ORDER — OXYCODONE HCL 5 MG PO TABS
5.0000 mg | ORAL_TABLET | ORAL | Status: DC | PRN
  Administered 2014-03-08 – 2014-03-09 (×4): 10 mg via ORAL
  Filled 2014-03-08 (×4): qty 2

## 2014-03-08 MED ORDER — LIDOCAINE HCL (CARDIAC) 20 MG/ML IV SOLN
INTRAVENOUS | Status: AC
  Filled 2014-03-08: qty 5

## 2014-03-08 MED ORDER — CHLORHEXIDINE GLUCONATE 4 % EX LIQD
60.0000 mL | Freq: Once | CUTANEOUS | Status: DC
  Filled 2014-03-08: qty 60

## 2014-03-08 MED ORDER — ALUM & MAG HYDROXIDE-SIMETH 200-200-20 MG/5ML PO SUSP: 30.0000 mL | ORAL | Status: DC | PRN

## 2014-03-08 MED ORDER — GLYCOPYRROLATE 0.2 MG/ML IJ SOLN
INTRAMUSCULAR | Status: AC
  Filled 2014-03-08: qty 1

## 2014-03-08 MED ORDER — GABAPENTIN 800 MG PO TABS
800.0000 mg | ORAL_TABLET | Freq: Two times a day (BID) | ORAL | Status: DC
  Administered 2014-03-08: 800 mg via ORAL
  Filled 2014-03-08 (×3): qty 1

## 2014-03-08 MED ORDER — METOCLOPRAMIDE HCL 10 MG PO TABS: 5.0000 mg | ORAL_TABLET | Freq: Three times a day (TID) | ORAL | Status: DC | PRN

## 2014-03-08 MED ORDER — WARFARIN SODIUM 5 MG PO TABS
5.0000 mg | ORAL_TABLET | ORAL | Status: DC
  Administered 2014-03-08: 5 mg via ORAL
  Filled 2014-03-08: qty 1

## 2014-03-08 SURGICAL SUPPLY — 67 items
ANCHOR PEEK 4.75X19.1 SWLK C (Anchor) ×4 IMPLANT
BLADE CUTTER GATOR 3.5 (BLADE) ×2 IMPLANT
BLADE GREAT WHITE 4.2 (BLADE) ×2 IMPLANT
BLADE SURG 11 STRL SS (BLADE) ×2 IMPLANT
BOOTCOVER CLEANROOM LRG (PROTECTIVE WEAR) ×4 IMPLANT
BUR 3.5 LG SPHERICAL (BURR) IMPLANT
BUR OVAL 4.0 (BURR) ×2 IMPLANT
BURR 3.5 LG SPHERICAL (BURR)
CANISTER SUCT LVC 12 LTR MEDI- (MISCELLANEOUS) ×2 IMPLANT
CANNULA ACUFLEX KIT 5X76 (CANNULA) ×2 IMPLANT
CANNULA DRILOCK 5.0X75 (CANNULA) ×2 IMPLANT
CANNULA TWIST IN 8.25X7CM (CANNULA) ×2 IMPLANT
CLSR STERI-STRIP ANTIMIC 1/2X4 (GAUZE/BANDAGES/DRESSINGS) ×2 IMPLANT
CONNECTOR 5 IN 1 STRAIGHT STRL (MISCELLANEOUS) ×2 IMPLANT
DRAPE INCISE 23X17 IOBAN STRL (DRAPES) ×1
DRAPE INCISE IOBAN 23X17 STRL (DRAPES) ×1 IMPLANT
DRAPE INCISE IOBAN 66X45 STRL (DRAPES) ×2 IMPLANT
DRAPE ORTHO SPLIT 77X108 STRL (DRAPES) ×2
DRAPE STERI 35X30 U-POUCH (DRAPES) ×2 IMPLANT
DRAPE SURG 17X11 SM STRL (DRAPES) ×2 IMPLANT
DRAPE SURG ORHT 6 SPLT 77X108 (DRAPES) ×2 IMPLANT
DRAPE U-SHAPE 47X51 STRL (DRAPES) ×2 IMPLANT
DRSG PAD ABDOMINAL 8X10 ST (GAUZE/BANDAGES/DRESSINGS) ×2 IMPLANT
DURAPREP 26ML APPLICATOR (WOUND CARE) ×4 IMPLANT
ELECT REM PT RETURN 9FT ADLT (ELECTROSURGICAL) ×2
ELECTRODE REM PT RTRN 9FT ADLT (ELECTROSURGICAL) ×1 IMPLANT
GAUZE SPONGE 4X4 12PLY STRL (GAUZE/BANDAGES/DRESSINGS) ×2 IMPLANT
GLOVE BIO SURGEON STRL SZ7.5 (GLOVE) ×2 IMPLANT
GLOVE BIO SURGEON STRL SZ8 (GLOVE) ×2 IMPLANT
GLOVE EUDERMIC 7 POWDERFREE (GLOVE) ×2 IMPLANT
GLOVE SS BIOGEL STRL SZ 7.5 (GLOVE) ×1 IMPLANT
GLOVE SUPERSENSE BIOGEL SZ 7.5 (GLOVE) ×1
GOWN STRL REUS W/ TWL LRG LVL3 (GOWN DISPOSABLE) ×1 IMPLANT
GOWN STRL REUS W/ TWL XL LVL3 (GOWN DISPOSABLE) ×4 IMPLANT
GOWN STRL REUS W/TWL LRG LVL3 (GOWN DISPOSABLE) ×1
GOWN STRL REUS W/TWL XL LVL3 (GOWN DISPOSABLE) ×4
KIT BASIN OR (CUSTOM PROCEDURE TRAY) ×2 IMPLANT
KIT ROOM TURNOVER OR (KITS) ×2 IMPLANT
KIT SHOULDER TRACTION (DRAPES) ×2 IMPLANT
MANIFOLD NEPTUNE II (INSTRUMENTS) ×2 IMPLANT
NDL SUT 6 .5 CRC .975X.05 MAYO (NEEDLE) IMPLANT
NEEDLE MAYO TAPER (NEEDLE)
NEEDLE SCORPION MULTI FIRE (NEEDLE) ×2 IMPLANT
NEEDLE SPNL 18GX3.5 QUINCKE PK (NEEDLE) ×2 IMPLANT
NS IRRIG 1000ML POUR BTL (IV SOLUTION) ×2 IMPLANT
PACK SHOULDER (CUSTOM PROCEDURE TRAY) ×2 IMPLANT
PAD ABD 8X10 STRL (GAUZE/BANDAGES/DRESSINGS) ×2 IMPLANT
PAD ARMBOARD 7.5X6 YLW CONV (MISCELLANEOUS) ×4 IMPLANT
SET ARTHROSCOPY TUBING (MISCELLANEOUS) ×1
SET ARTHROSCOPY TUBING LN (MISCELLANEOUS) ×1 IMPLANT
SLING ARM FOAM STRAP LRG (SOFTGOODS) ×2 IMPLANT
SLING ARM LRG ADULT FOAM STRAP (SOFTGOODS) IMPLANT
SLING ARM MED ADULT FOAM STRAP (SOFTGOODS) ×2 IMPLANT
SPONGE LAP 4X18 X RAY DECT (DISPOSABLE) ×2 IMPLANT
STRIP CLOSURE SKIN 1/2X4 (GAUZE/BANDAGES/DRESSINGS) ×2 IMPLANT
SUT MNCRL AB 3-0 PS2 18 (SUTURE) ×2 IMPLANT
SUT PDS AB 0 CT 36 (SUTURE) IMPLANT
SUT RETRIEVER GRASP 30 DEG (SUTURE) IMPLANT
SUT TIGER TAPE 7 IN WHITE (SUTURE) IMPLANT
SYR 20CC LL (SYRINGE) ×2 IMPLANT
TAPE FIBER 2MM 7IN #2 BLUE (SUTURE) ×4 IMPLANT
TAPE PAPER 2X10 WHT MICROPORE (GAUZE/BANDAGES/DRESSINGS) ×2 IMPLANT
TAPE PAPER 3X10 WHT MICROPORE (GAUZE/BANDAGES/DRESSINGS) ×2 IMPLANT
TOWEL OR 17X24 6PK STRL BLUE (TOWEL DISPOSABLE) ×2 IMPLANT
TOWEL OR 17X26 10 PK STRL BLUE (TOWEL DISPOSABLE) ×2 IMPLANT
WAND SUCTION MAX 4MM 90S (SURGICAL WAND) ×4 IMPLANT
WATER STERILE IRR 1000ML POUR (IV SOLUTION) ×2 IMPLANT

## 2014-03-08 NOTE — Op Note (Signed)
03/08/2014  2:31 PM  PATIENT:   Brandon [name].  64 y.o. male  PRE-OPERATIVE DIAGNOSIS:  LEFT SHOULDER IMPINGEMENT, ac joint oa, rotator cuff tear  POST-OPERATIVE DIAGNOSIS:  Same with synovitis, chondromalacia, labral tear  PROCEDURE:  LSA, chondroplasty, labral debridement, limited synovectomy, SAD, DCR, RCR  SURGEON:  Reita Shindler, Metta Clines M.D.  ASSISTANTS: Shuford pac   ANESTHESIA:   GET + ISB  EBL: min  SPECIMEN:  none  Drains: none   PATIENT DISPOSITION:  PACU - hemodynamically stable.    PLAN OF CARE: Admit for overnight observation  Dictation# [id number]

## 2014-03-08 NOTE — H&P (Signed)
Brandon B Gupta Jr.    Chief Complaint: LEFT SHOULDER IMPINGEMENT HPI: The patient is a 64 y.o. male with chronic left shoulder pain and impingment with MRI evidence of small full th ickness rotator cuff tear  Past Medical History  Diagnosis Date  . Arthritis   . Sciatica   . Anxiety   . Hypertension   . Osteoporosis   . Cataract   . Asthma     Patient and wife both dinies patient has or had asthma  . Enlarged prostate   . Left atrial enlargement   . Persistent atrial fibrillation   . Obesity   . Bilateral pulmonary embolism 3/14    admitted to [facility],  treated with Xarelto  . Chronic pain   . Thrombus of left atrial appendage 03/09/2013  . Mitral regurgitation 04/24/2013    Mild by TEE  . Fatigue   . Dyspnea on exertion   . Polydipsia   . Anxiety disorder   . Benign neoplasm of colon   . Benign prostatic hyperplasia with urinary obstruction   . Chest pain   . Encounter for long-term (current) use of other medications   . Hyperlipemia   . Hypopotassemia   . Insomnia   . Major depressive disorder, recurrent episode   . Osteoarthritis   . Periodic limb movement disorder   . SOB (shortness of breath)   . Thoracic or lumbosacral neuritis or radiculitis, unspecified   . Ventral hernia, unspecified, without mention of obstruction or gangrene     right abdominal wall  . Restless leg syndrome     takes gabapentin  . Hepatitis C virus     patient denies  . Dysrhythmia     afib   . OSA (obstructive sleep apnea)     noncompliant with CPAP.  07/27/13- awaiting a CPAP,  . Obstructive sleep apnea     Past Surgical History  Procedure Laterality Date  . Knee arthroscopy      left  . Colonoscopy w/ polypectomy    . Rotator cuff repair      right  . Tonsillectomy    . Cataract extraction    . Hernia repair  07/06/2011  . Replacement total knee Left   . Tee without cardioversion N/A 03/21/2012    Procedure: TRANSESOPHAGEAL ECHOCARDIOGRAM (TEE);  Surgeon: Birdie Riddle, MD;   Location: [facility];  Service: Cardiovascular;  Laterality: N/A;  . Cardioversion N/A 03/21/2012    Procedure: CARDIOVERSION;  Surgeon: Birdie Riddle, MD;  Location: [facility] ENDOSCOPY;  Service: Cardiovascular;  Laterality: N/A;  . Tee without cardioversion N/A 04/24/2013    Procedure: TRANSESOPHAGEAL ECHOCARDIOGRAM (TEE);  Surgeon: Dorothy Spark, MD;  Location: [place];  Service: Cardiovascular;  Laterality: N/A;  . Cardioversion N/A 04/24/2013    Procedure: CARDIOVERSION;  Surgeon: Dorothy Spark, MD;  Location: [place];  Service: Cardiovascular;  Laterality: N/A;  . Eye surgery Right     cataract  . Femur fracture surgery    . Inguinal hernia repair Right 07/28/2013    Procedure: RIGHT INGUINAL HERNIA REPAIR;  Surgeon: Imogene Burn. Georgette Dover, MD;  Location: [place];  Service: General;  Laterality: Right;  . Insertion of mesh Right 07/28/2013    Procedure: INSERTION OF MESH;  Surgeon: Imogene Burn. Georgette Dover, MD;  Location: [place] [facility];  Service: General;  Laterality: Right;    Family History  Problem Relation Age of Onset  . Cancer Father     bone  . Cancer Paternal Grandmother  ovarian  . CVA Other     Fam Hx of multiple myeloma  . Diabetes Other     Fam Hx of DM    Social History:  reports that he has quit smoking. He has never used smokeless tobacco. He reports that he drinks alcohol. He reports that he does not use illicit drugs.  Allergies:  Allergies  Allergen Reactions  . Ace Inhibitors Other (See Comments)    REACTION: cough    Medications Prior to Admission  Medication Sig Dispense Refill  . amiodarone (PACERONE) 200 MG tablet Take 1 tablet (200 mg total) by mouth daily. 30 tablet 3  . diazepam (VALIUM) 5 MG tablet Take 1 tablet (5 mg total) by mouth at bedtime. 30 tablet 5  . escitalopram (LEXAPRO) 20 MG tablet Take 20 mg by mouth daily.    Marland Kitchen gabapentin (NEURONTIN) 800 MG tablet Takes 1  tablet daily at dinner, and take 1 tablet every night at bedtime. 60 tablet 11  .  HYDROcodone-acetaminophen (NORCO/VICODIN) 5-325 MG per tablet Take 1 tablet by mouth every 4 (four) hours as needed for severe pain.     Marland Kitchen lidocaine (LIDODERM) 5 % Place 1 patch onto the skin daily as needed (Shoulder).     . Naproxen Sodium (ALEVE) 220 MG CAPS Take 1 capsule by mouth daily as needed (for pain).     . tamsulosin (FLOMAX) 0.4 MG CAPS capsule Take 0.8 mg by mouth daily.    Marland Kitchen warfarin (COUMADIN) 5 MG tablet Take as directed by [facility] (Patient taking differently: Take 2.5-5 mg by mouth daily. 2.5 mg on Monday, 5 mg all other days) 30 tablet 3     Physical Exam: left shoulder with painful and restricted motion as noted at recent office visit  Vitals  Temp:  [98.1 F (36.7 C)] 98.1 F (36.7 C) (03/03 1118) Pulse Rate:  [56] 56 (03/03 1118) Resp:  [18] 18 (03/03 1118) BP: (176)/(91) 176/91 mmHg (03/03 1118) SpO2:  [95 %] 95 % (03/03 1118) Weight:  [112.038 kg (247 lb)] 112.038 kg (247 lb) (03/03 1118)  Assessment/Plan  Impression: LEFT SHOULDER IMPINGEMENT  Plan of Action: Procedure(s): LEFT SHOULDER ARTHROSCOPY WITH ROTATOR CUFF REPAIR/SUBACROMIAL DECOMPRESSION/DISTAL CLAVICLE RESECTION  [name] 03/08/2014, 11:52 AM

## 2014-03-08 NOTE — Discharge Instructions (Signed)
° °  Kevin M. Supple, M.D., F.A.A.O.S. °Orthopaedic Surgery °Specializing in Arthroscopic and Reconstructive °Surgery of the Shoulder and Knee °[phone] °[address] - [place], [place] [address] - Fax [phone] ° °POST-OP SHOULDER ARTHROSCOPIC ROTATOR CUFF AND/OR LABRAL REPAIR INSTRUCTIONS ° °1. Call the office at [phone] to schedule your first post-op appointment 7-10 days from the date of your surgery. ° °2. Leave the steri-strips in place over your incisions when performing dressing changes and showering. You may remove your dressings and begin showering 72 hours from surgery. You can expect drainage that is clear to bloody in nature that occasionally will soak through your dressings. If this occurs go ahead and perform a dressing change. The drainage should lessen daily and when there is no drainage from your incisions feel free to go without a dressing. ° °3. Wear your sling/immobilizer at all times except to perform the exercises below or to occasionally let your arm dangle by your side to stretch your elbow. You also need to sleep in your sling immobilizer until instructed otherwise. ° °4. Range of motion to your elbow, wrist, and hand are encouraged 3-5 times daily. Exercise to your hand and fingers helps to reduce swelling you may experience. ° °5. Utilize ice to the shoulder 3-4 times minimum a day and additionally if you are experiencing pain. ° °6. You may one-armed drive when safely off of narcotics and muscle relaxants. You may use your hand that is in the sling to support the steering wheel only. However, should it be your right arm that is in the sling it is not to be used for gear shifting in a manual transmission. ° °7. If you had a block pre-operatively to provide post-op pain relief you may want to go ahead and begin utilizing your pain meds as your arm begins to wake up. Blocks can sometimes last up to 16-18 hours. If you are still pain-free prior to going to bed you may  want to strongly consider taking a pain medication to avoid being awakened in the night with the onset of pain. A muscle relaxant is also provided for you should you experience muscle spasms. It is recommended that if you are experiencing pain that your pain medication alone is not controlling, add the muscle relaxant along with the pain medication which can give additional pain relief. The first one to two days is generally the most severe of your pain and then should gradually decrease. As your pain lessens it is recommended that you decrease your use of the pain medications to an "as needed basis" only and to always comply with the recommended dosages of the pain medications. ° °8. Pain medications can produce constipation along with their use. If you experience this, the use of an over the counter stool softener or laxative daily is recommended.  ° °9. For additional questions or concerns, please do not hesitate to call the office. If after hours there is an answering service to forward your concerns to the physician on call. ° °POST-OP EXERCISES ° °Pendulum Exercises ° °Perform pendulum exercises while standing and bending at the waist. Support your uninvolved arm on a table or chair and allow your operated arm to hang freely. Make sure to do these exercises passively - not using you shoulder muscle. ° °Repeat 20 times. Do 3 sessions per day. ° ° °

## 2014-03-08 NOTE — Anesthesia Preprocedure Evaluation (Addendum)
Anesthesia Evaluation  Patient identified by MRN, date of birth, ID band Patient awake    Reviewed: Allergy & Precautions, NPO status , Patient's Chart, lab work & pertinent test results  Airway Mallampati: II   Neck ROM: Full    Dental  (+) Teeth Intact, Dental Advisory Given   Pulmonary shortness of breath, sleep apnea , former smoker,  breath sounds clear to auscultation        Cardiovascular hypertension, + dysrhythmias + Valvular Problems/Murmurs MR Rhythm:Regular Rate:Normal     Neuro/Psych  Neuromuscular disease    GI/Hepatic (+) Hepatitis -  Endo/Other    Renal/GU      Musculoskeletal  (+) Arthritis -,   Abdominal   Peds  Hematology   Anesthesia Other Findings   Reproductive/Obstetrics                          Anesthesia Physical Anesthesia Plan  ASA: III  Anesthesia Plan: General   Post-op Pain Management:    Induction: Intravenous  Airway Management Planned: Oral ETT  Additional Equipment:   Intra-op Plan:   Post-operative Plan: Extubation in OR  Informed Consent: I have reviewed the patients History and Physical, chart, labs and discussed the procedure including the risks, benefits and alternatives for the proposed anesthesia with the patient or authorized representative who has indicated his/her understanding and acceptance.   Dental advisory given  Plan Discussed with:   Anesthesia Plan Comments:         Anesthesia Quick Evaluation

## 2014-03-08 NOTE — Transfer of Care (Signed)
Immediate Anesthesia Transfer of Care Note  Patient: Brandon [name].  Procedure(s) Performed: Procedure(s): LEFT SHOULDER ARTHROSCOPY WITH ROTATOR CUFF REPAIR/SUBACROMIAL DECOMPRESSION/DISTAL CLAVICLE RESECTION (Left)  Patient Location: PACU  Anesthesia Type:General  Level of Consciousness: awake, alert  and oriented  Airway & Oxygen Therapy: Patient Spontanous Breathing and Patient connected to nasal cannula oxygen  Post-op Assessment: Report given to RN, Post -op Vital signs reviewed and stable and Patient moving all extremities  Post vital signs: Reviewed and stable  Last Vitals:  Filed Vitals:   03/08/14 1426  BP:   Pulse:   Temp: 36.4 C  Resp:     Complications: No apparent anesthesia complications

## 2014-03-08 NOTE — Anesthesia Postprocedure Evaluation (Signed)
  Anesthesia Post-op Note  Patient: Brandon [name].  Procedure(s) Performed: Procedure(s): LEFT SHOULDER ARTHROSCOPY WITH ROTATOR CUFF REPAIR/SUBACROMIAL DECOMPRESSION/DISTAL CLAVICLE RESECTION (Left)  Patient Location: PACU  Anesthesia Type:GA combined with regional for post-op pain  Level of Consciousness: awake and alert   Airway and Oxygen Therapy: Patient Spontanous Breathing  Post-op Pain: mild  Post-op Assessment: Post-op Vital signs reviewed  Post-op Vital Signs: stable  Last Vitals:  Filed Vitals:   03/08/14 1539  BP:   Pulse: 53  Temp:   Resp: 12    Complications: No apparent anesthesia complications

## 2014-03-08 NOTE — Anesthesia Procedure Notes (Addendum)
Anesthesia Regional Block:  Interscalene brachial plexus block  Pre-Anesthetic Checklist: ,, timeout performed, Correct Patient, Correct Site, Correct Laterality, Correct Procedure, Correct Position, site marked, Risks and benefits discussed,  Surgical consent,  Pre-op evaluation,  At surgeon's request and post-op pain management  Laterality: Upper and Left  Prep: chloraprep and alcohol swabs       Needles:  Injection technique: Single-shot  Needle Type: Echogenic Needle     Needle Length: 9cm 9 cm Needle Gauge: 21 and 21 G  Needle insertion depth: 4 cm   Additional Needles:  Procedures: ultrasound guided (picture in chart) and nerve stimulator Interscalene brachial plexus block  Nerve Stimulator or Paresthesia:  Response: Twitch elicited, 0.5 mA, 0.3 ms,   Additional Responses:   Narrative:  Start time: 03/08/2014 11:45 AM End time: 03/08/2014 12:05 PM Injection made incrementally with aspirations every 5 mL.  Performed by: Personally  Anesthesiologist: MASSAGEE, TERRY  Additional Notes: Block assessed prior to start of surgery   Procedure Name: Intubation Date/Time: 03/08/2014 12:40 PM Performed by: Rush Farmer E Pre-anesthesia Checklist: Patient identified, Emergency Drugs available, Suction available, Patient being monitored and Timeout performed Patient Re-evaluated:Patient Re-evaluated prior to inductionOxygen Delivery Method: Circle system utilized Preoxygenation: Pre-oxygenation with 100% oxygen Intubation Type: IV induction Ventilation: Mask ventilation without difficulty Laryngoscope Size: Mac and 4 Grade View: Grade II Tube type: Oral Tube size: 7.5 mm Number of attempts: 1 Airway Equipment and Method: Stylet Placement Confirmation: ETT inserted through vocal cords under direct vision,  positive ETCO2 and breath sounds checked- equal and bilateral Secured at: 23 cm Tube secured with: Tape Dental Injury: Teeth and Oropharynx as per pre-operative  assessment

## 2014-03-09 ENCOUNTER — Encounter (HOSPITAL_COMMUNITY): Payer: Self-pay | Admitting: Orthopedic Surgery

## 2014-03-09 DIAGNOSIS — M7542 Impingement syndrome of left shoulder: Secondary | ICD-10-CM | POA: Diagnosis not present

## 2014-03-09 LAB — CBC
HCT: 38.5 % — ABNORMAL LOW (ref 39.0–52.0)
Hemoglobin: 13 g/dL (ref 13.0–17.0)
MCH: 30.7 pg (ref 26.0–34.0)
MCHC: 33.8 g/dL (ref 30.0–36.0)
MCV: 90.8 fL (ref 78.0–100.0)
Platelets: 132 10*3/uL — ABNORMAL LOW (ref 150–400)
RBC: 4.24 MIL/uL (ref 4.22–5.81)
RDW: 14.4 % (ref 11.5–15.5)
WBC: 8.6 10*3/uL (ref 4.0–10.5)

## 2014-03-09 MED ORDER — DIAZEPAM 5 MG PO TABS: 2.5000 mg | ORAL_TABLET | Freq: Four times a day (QID) | ORAL | Status: DC | PRN

## 2014-03-09 MED ORDER — GABAPENTIN 400 MG PO CAPS
800.0000 mg | ORAL_CAPSULE | Freq: Two times a day (BID) | ORAL | Status: DC
  Administered 2014-03-09: 800 mg via ORAL
  Filled 2014-03-09: qty 2

## 2014-03-09 MED ORDER — OXYCODONE-ACETAMINOPHEN 5-325 MG PO TABS: 1.0000 | ORAL_TABLET | ORAL | Status: DC | PRN

## 2014-03-09 NOTE — Discharge Summary (Signed)
PATIENT ID:      Brandon [name].  MRN:     [id number] DOB/AGE:    05-02-1950 / 64 y.o.     DISCHARGE SUMMARY  ADMISSION DATE:    03/08/2014 DISCHARGE DATE:    ADMISSION DIAGNOSIS: LEFT SHOULDER IMPINGEMENT Past Medical History  Diagnosis Date  . Arthritis   . Sciatica   . Anxiety   . Hypertension   . Osteoporosis   . Cataract   . Asthma     Patient and wife both dinies patient has or had asthma  . Enlarged prostate   . Left atrial enlargement   . Persistent atrial fibrillation   . Obesity   . Bilateral pulmonary embolism 3/14    admitted to [facility],  treated with Xarelto  . Chronic pain   . Thrombus of left atrial appendage 03/09/2013  . Mitral regurgitation 04/24/2013    Mild by TEE  . Fatigue   . Dyspnea on exertion   . Polydipsia   . Anxiety disorder   . Benign neoplasm of colon   . Benign prostatic hyperplasia with urinary obstruction   . Chest pain   . Encounter for long-term (current) use of other medications   . Hyperlipemia   . Hypopotassemia   . Insomnia   . Major depressive disorder, recurrent episode   . Osteoarthritis   . Periodic limb movement disorder   . SOB (shortness of breath)   . Thoracic or lumbosacral neuritis or radiculitis, unspecified   . Ventral hernia, unspecified, without mention of obstruction or gangrene     right abdominal wall  . Restless leg syndrome     takes gabapentin  . Hepatitis C virus     patient denies  . Dysrhythmia     afib   . OSA (obstructive sleep apnea)     noncompliant with CPAP.  07/27/13- awaiting a CPAP,  . Obstructive sleep apnea     DISCHARGE DIAGNOSIS:   Active Problems:   Rotator cuff tear   PROCEDURE: Procedure(s): LEFT SHOULDER ARTHROSCOPY WITH ROTATOR CUFF REPAIR/SUBACROMIAL DECOMPRESSION/DISTAL CLAVICLE RESECTION on 03/08/2014  CONSULTS:     HISTORY:  See H&P in chart.  HOSPITAL COURSE:  Brandon [name]. is a 64 y.o. admitted on 03/08/2014 with a chief complaint of left shoulder pain with rotator cuff  tear, and found to have a diagnosis of LEFT SHOULDER IMPINGEMENT.  They were brought to the operating room on 03/08/2014 and underwent Procedure(s): LEFT SHOULDER ARTHROSCOPY WITH ROTATOR CUFF REPAIR/SUBACROMIAL DECOMPRESSION/DISTAL CLAVICLE RESECTION.    They were given perioperative antibiotics: Anti-infectives    Start     Dose/Rate Route Frequency Ordered Stop   03/08/14 0600  ceFAZolin (ANCEF) IVPB 2 g/50 mL premix     2 g 100 mL/hr over 30 Minutes Intravenous On call to O.R. 03/07/14 1319 03/08/14 1255    .  Patient underwent the above named procedure and tolerated it well. The following day they were hemodynamically stable and pain was controlled on oral analgesics. They were neurovascularly intact to the operative extremity.He was kept overnight due to sleep apnea and was stable from breathing standpoint. He is to resume home Lovenox and coumadin for chronic anticoagulation. They were medically and orthopaedically stable for discharge on .    DIAGNOSTIC STUDIES:  RECENT RADIOGRAPHIC STUDIES :  No results found.  RECENT VITAL SIGNS:  Patient Vitals for the past 24 hrs:  BP Temp Temp src Pulse Resp SpO2 Height Weight  03/09/14 0438 (!) 147/71 mmHg  98.9 F (37.2 C) Oral 64 16 97 % - -  03/09/14 0015 140/66 mmHg 98.8 F (37.1 C) Oral 71 16 96 % - -  03/08/14 1946 (!) 149/76 mmHg 98.9 F (37.2 C) Oral 63 14 96 % - -  03/08/14 1602 (!) 142/72 mmHg 98 F (36.7 C) Oral (!) 57 12 90 % - -  03/08/14 1545 - - - - - 98 % - -  03/08/14 1539 - 97.4 F (36.3 C) - (!) 53 12 97 % - -  03/08/14 1530 (!) 148/72 mmHg - - (!) 52 11 97 % - -  03/08/14 1515 (!) 153/81 mmHg - - (!) 52 12 100 % - -  03/08/14 1510 - - - (!) 51 12 98 % - -  03/08/14 1500 (!) 147/78 mmHg - - (!) 52 11 99 % - -  03/08/14 1445 (!) 146/77 mmHg - - 61 15 96 % - -  03/08/14 1430 - - - 63 15 96 % - -  03/08/14 1426 139/74 mmHg 97.6 F (36.4 C) - 63 15 96 % - -  03/08/14 1209 - - - 73 17 95 % - -  03/08/14 1200 - - - (!)  59 (!) 7 95 % - -  03/08/14 1145 - - - (!) 54 11 98 % - -  03/08/14 1118 (!) 176/91 mmHg 98.1 F (36.7 C) Oral (!) 56 18 95 % 6\' 3"  (1.905 m) 112.038 kg (247 lb)  .  RECENT EKG RESULTS:    Orders placed or performed in visit on 02/22/14  . EKG 12-Lead    DISCHARGE INSTRUCTIONS:  Discharge Instructions    Change dressing    Complete by:  As directed            DISCHARGE MEDICATIONS:     Medication List    STOP taking these medications        HYDROcodone-acetaminophen 5-325 MG per tablet  Commonly known as:  NORCO/VICODIN      TAKE these medications        ALEVE 220 MG Caps  Generic drug:  Naproxen Sodium  Take 1 capsule by mouth daily as needed (for pain).     amiodarone 200 MG tablet  Commonly known as:  PACERONE  Take 1 tablet (200 mg total) by mouth daily.     diazepam 5 MG tablet  Commonly known as:  VALIUM  Take 1 tablet (5 mg total) by mouth at bedtime.     diazepam 5 MG tablet  Commonly known as:  VALIUM  Take 0.5-1 tablets (2.5-5 mg total) by mouth every 6 (six) hours as needed for muscle spasms or sedation.     escitalopram 20 MG tablet  Commonly known as:  LEXAPRO  Take 20 mg by mouth daily.     gabapentin 800 MG tablet  Commonly known as:  NEURONTIN  Takes 1  tablet daily at dinner, and take 1 tablet every night at bedtime.     lidocaine 5 %  Commonly known as:  LIDODERM  Place 1 patch onto the skin daily as needed (Shoulder).     oxyCODONE-acetaminophen 5-325 MG per tablet  Commonly known as:  PERCOCET  Take 1-2 tablets by mouth every 4 (four) hours as needed.     tamsulosin 0.4 MG Caps capsule  Commonly known as:  FLOMAX  Take 0.8 mg by mouth daily.     warfarin 5 MG tablet  Commonly known as:  COUMADIN  Take as  directed by Coumadin Clinic        FOLLOW UP VISIT:    DISCHARGE TO: Home  DISPOSITION: Good DISCHARGE CONDITION:  Good   Taneasha Fuqua for Dr. Justice Britain 03/09/2014, 7:44 AM

## 2014-03-09 NOTE — Op Note (Signed)
NAME:  Brandon [name]                    ACCOUNT NO.:  [id number]  MEDICAL RECORD NO.:  [id number]  LOCATION:  5N10C                        FACILITY:  [facility]  PHYSICIAN:  Metta Clines. Lorrine Killilea, M.D.  DATE OF BIRTH:  1950/05/25  DATE OF PROCEDURE:  03/08/2014 DATE OF DISCHARGE:                              OPERATIVE REPORT   PREOPERATIVE DIAGNOSES: 1. Chronic left shoulder pain with impingement syndrome. 2. Left shoulder rotator cuff tear. 3. Left shoulder acromioclavicular joint arthropathy.  POSTOPERATIVE DIAGNOSES: 1. Chronic left shoulder pain with impingement syndrome. 2. Left shoulder rotator cuff tear. 3. Left shoulder acromioclavicular joint arthropathy. 4. Left shoulder glenohumeral chondromalacia. 5. Degenerative labral tear. 6. Synovitis, glenohumeral joint.  PROCEDURES: 1. Left shoulder examination under anesthesia. 2. Left shoulder glenohumeral joint diagnostic arthroscopy. 3. Chondroplasty of the humeral head and glenoid. 4. Labral debridement. 5. Limited synovectomy. 6. Arthroscopic subacromial decompression and bursectomy. 7. Arthroscopic distal clavicle resection. 8. Arthroscopic rotator cuff repair using a double-row suture bridge     repair construct.  SURGEON:  Metta Clines. Rayen Dafoe, M.D.  Terrence DupontOlivia Mackie A. Shuford, P.A.-C.  ANESTHESIA:  General endotracheal as well as interscalene block.  ESTIMATED BLOOD LOSS:  Minimal.  HISTORY:  Brandon [name] is a 64 year old gentleman who has chronic and progressive increasing left shoulder pain with impingement syndrome. Radiographs showing evidence for [facility] joint degenerative change and bony impingement with an MRI scan confirming a full-thickness tear of the rotator cuff.  Due to his ongoing pain, functional limitations, failure to respond to conservative management, he was brought to the operating room at this time for planned left shoulder arthroscopy as described below.  Preoperatively, I counseled Mr. Warrell regarding  treatment options and risks versus benefits thereof.  Possible surgical complications were all reviewed including the potential for bleeding, infection, neurovascular injury, persistent pain, loss of motion, anesthetic complication, recurrence of rotator cuff tear, possible need for additional surgery. He understands and accepts and agrees with our planned procedure.  PROCEDURE IN DETAIL:  After undergoing routine preop evaluation, the patient received prophylactic antibiotics.  An interscalene block was established in the holding area by the Anesthesia Department.  Placed supine on the operating table, underwent smooth induction of general endotracheal anesthesia.  Turned to the right lateral decubitus position on a beanbag and appropriately padded and protected.  Left shoulder examination under anesthesia revealed full motion and no instability patterns were noted.  Left arm was then suspended at 70 degrees of abduction with 10 pounds of traction.  The left shoulder girdle region was sterilely prepped and draped in standard fashion.  Time-out was called.  Posterior portal was established in the glenohumeral joint and anterior portal was established under direct visualization.  There was evidence for some early glenohumeral joint degenerative change with areas of grade 1 and 2 chondromalacia of the humeral head and the glenoid and several areas of chondrocalcinosis and these regions were all debrided with a shaver.  There was degenerative tearing of the labrum and tear of the posteriorly and superiorly, which were all debrided.  We completed the chondroplasty and labral debridement and also performed a limited synovectomy of the joint.  The rotator cuff  showed an obvious full-thickness defect involving supraspinatus, which we debrided from the articular side.  Biceps tendon was carefully probed, inspected and found to be intact with normal caliber with no evidence for instability  proximally or distally.  Fluids and instruments were then removed after hemostasis was obtained.  The arm was then dropped down to 30 degrees of abduction with the arthroscope introduced in the subacromial space to the posterior portal and a direct lateral portal established in the subacromial space.  Abundant dense bursal tissue and multiple adhesions were encountered and these were all divided and excised with the combination of shaver and the Stryker wand. The wand was then used to remove the periosteum from the undersurface of the anterior half of the acromion and then a subacromial decompression was performed with a burr, creating a type 1 morphology.  Portal was then established directly anterior to the distal clavicle and then distal clavicle resection was performed with a burr.  Care was taken to confirm visualization of the entire circumference of the distal clavicle to ensure adequate removal of the bone.  We then completed the subacromial/subdeltoid bursectomy.  The rotator cuff tear was identified, debrided back to healthy tissue at its margin.  The greater tuberosity was prepared removing soft tissue and gently abrading the bone to bleeding bed with footprint approximately 2-2.5 cm in width.  We confirmed the rotator cuff, was properly mobilized.  Through a stab wound off the lateral margin of the acromion, we placed an Arthrex PEEK SwiveLock suture anchor loaded with a fiber tape.  The suture limbs were all passed equidistant across the width of the rotator cuff tear using the Scorpion suture retriever.  We then placed a lateral row anchor creating a "suture bridge" with nicely compressed the margin of the rotator cuff against the bony bed of tuberosity.  Overall, construct was much to our satisfaction.  Suture limbs were all clipped.  Final hemostasis was obtained.  Fluid and instruments were removed.  The portals were closed with Monocryl and Steri-Strips.  A dry dressing  was taped at the left foot.  The left arm was placed in a sling.  The patient was awakened, extubated, and taken to the recovery room in stable condition.  Jenetta Loges, PA-C was used as an Environmental consultant throughout this case, essential for help with positioning of the patient, positioning of the extremity, management of the arthroscopic equipment, tissue manipulations, suture management, wound closure, and intraoperative decision-making.     Metta Clines. Alejo Beamer, M.D.     KMS/MEDQ  D:  03/08/2014  T:  03/09/2014  Job:  [id number]

## 2014-03-09 NOTE — Progress Notes (Signed)
UR completed 

## 2014-03-15 ENCOUNTER — Ambulatory Visit (INDEPENDENT_AMBULATORY_CARE_PROVIDER_SITE_OTHER): Payer: Medicare Other

## 2014-03-15 ENCOUNTER — Other Ambulatory Visit: Payer: Self-pay

## 2014-03-15 DIAGNOSIS — I4891 Unspecified atrial fibrillation: Secondary | ICD-10-CM

## 2014-03-15 DIAGNOSIS — Z5181 Encounter for therapeutic drug level monitoring: Secondary | ICD-10-CM | POA: Diagnosis not present

## 2014-03-15 DIAGNOSIS — I749 Embolism and thrombosis of unspecified artery: Secondary | ICD-10-CM | POA: Diagnosis not present

## 2014-03-15 LAB — POCT INR: INR: 1.8

## 2014-03-15 MED ORDER — AMIODARONE HCL 200 MG PO TABS: 200.0000 mg | ORAL_TABLET | Freq: Every day | ORAL | Status: DC

## 2014-03-15 NOTE — Telephone Encounter (Signed)
Pt seen in Coumadin clinic requesting Amiodarone refill, Dr Rayann Heman pt, 30 day supply to [place].

## 2014-03-29 ENCOUNTER — Ambulatory Visit (INDEPENDENT_AMBULATORY_CARE_PROVIDER_SITE_OTHER): Payer: Medicare Other | Admitting: *Deleted

## 2014-03-29 DIAGNOSIS — Z5181 Encounter for therapeutic drug level monitoring: Secondary | ICD-10-CM

## 2014-03-29 DIAGNOSIS — I4891 Unspecified atrial fibrillation: Secondary | ICD-10-CM

## 2014-03-29 DIAGNOSIS — I749 Embolism and thrombosis of unspecified artery: Secondary | ICD-10-CM | POA: Diagnosis not present

## 2014-03-29 LAB — POCT INR: INR: 3.8

## 2014-04-09 ENCOUNTER — Other Ambulatory Visit: Payer: Self-pay | Admitting: Internal Medicine

## 2014-04-18 ENCOUNTER — Ambulatory Visit (INDEPENDENT_AMBULATORY_CARE_PROVIDER_SITE_OTHER): Payer: Medicare Other | Admitting: *Deleted

## 2014-04-18 DIAGNOSIS — I4891 Unspecified atrial fibrillation: Secondary | ICD-10-CM | POA: Diagnosis not present

## 2014-04-18 DIAGNOSIS — Z5181 Encounter for therapeutic drug level monitoring: Secondary | ICD-10-CM | POA: Diagnosis not present

## 2014-04-18 DIAGNOSIS — I749 Embolism and thrombosis of unspecified artery: Secondary | ICD-10-CM | POA: Diagnosis not present

## 2014-04-18 LAB — POCT INR: INR: 3.3

## 2014-05-02 ENCOUNTER — Ambulatory Visit (INDEPENDENT_AMBULATORY_CARE_PROVIDER_SITE_OTHER): Payer: Medicare Other | Admitting: Surgery

## 2014-05-02 DIAGNOSIS — I4891 Unspecified atrial fibrillation: Secondary | ICD-10-CM | POA: Diagnosis not present

## 2014-05-02 DIAGNOSIS — Z5181 Encounter for therapeutic drug level monitoring: Secondary | ICD-10-CM

## 2014-05-02 DIAGNOSIS — I749 Embolism and thrombosis of unspecified artery: Secondary | ICD-10-CM | POA: Diagnosis not present

## 2014-05-02 LAB — POCT INR: INR: 2.3

## 2014-05-21 ENCOUNTER — Other Ambulatory Visit: Payer: Self-pay

## 2014-05-24 ENCOUNTER — Ambulatory Visit (INDEPENDENT_AMBULATORY_CARE_PROVIDER_SITE_OTHER): Payer: Medicare Other | Admitting: Internal Medicine

## 2014-05-24 ENCOUNTER — Other Ambulatory Visit: Payer: Self-pay

## 2014-05-24 ENCOUNTER — Ambulatory Visit (INDEPENDENT_AMBULATORY_CARE_PROVIDER_SITE_OTHER): Payer: Medicare Other | Admitting: Pharmacist

## 2014-05-24 ENCOUNTER — Encounter: Payer: Self-pay | Admitting: Internal Medicine

## 2014-05-24 VITALS — BP 152/90 | HR 63 | Ht 75.0 in | Wt 244.8 lb

## 2014-05-24 DIAGNOSIS — I4891 Unspecified atrial fibrillation: Secondary | ICD-10-CM

## 2014-05-24 DIAGNOSIS — G4733 Obstructive sleep apnea (adult) (pediatric): Secondary | ICD-10-CM | POA: Diagnosis not present

## 2014-05-24 DIAGNOSIS — I481 Persistent atrial fibrillation: Secondary | ICD-10-CM

## 2014-05-24 DIAGNOSIS — I749 Embolism and thrombosis of unspecified artery: Secondary | ICD-10-CM | POA: Diagnosis not present

## 2014-05-24 DIAGNOSIS — I10 Essential (primary) hypertension: Secondary | ICD-10-CM

## 2014-05-24 DIAGNOSIS — I4819 Other persistent atrial fibrillation: Secondary | ICD-10-CM

## 2014-05-24 DIAGNOSIS — I513 Intracardiac thrombosis, not elsewhere classified: Secondary | ICD-10-CM

## 2014-05-24 DIAGNOSIS — I5189 Other ill-defined heart diseases: Secondary | ICD-10-CM | POA: Diagnosis not present

## 2014-05-24 DIAGNOSIS — Z5181 Encounter for therapeutic drug level monitoring: Secondary | ICD-10-CM

## 2014-05-24 LAB — POCT INR: INR: 3.5

## 2014-05-24 MED ORDER — AMIODARONE HCL 200 MG PO TABS: 100.0000 mg | ORAL_TABLET | Freq: Every day | ORAL | Status: DC

## 2014-05-24 MED ORDER — LOSARTAN POTASSIUM 25 MG PO TABS: 25.0000 mg | ORAL_TABLET | Freq: Every day | ORAL | Status: DC

## 2014-05-24 NOTE — Progress Notes (Signed)
Electrophysiology Office Note   Date:  05/24/2014   ID:  Brandon Kading., DOB 03-Jul-1950, MRN [id number]  PCP:  Nilda Simmer, MD   Primary Electrophysiologist: Thompson Grayer, MD    Chief Complaint  Patient presents with  . Atrial Fibrillation     History of Present Illness: Brandon Eisen. is a 64 y.o. male who presents today for electrophysiology evaluation.   Doing well.  Maintaining sinus rhythm with  Amiodarone.  Today, he denies symptoms of palpitations, chest pain, shortness of breath, orthopnea, PND, lower extremity edema, claudication, dizziness, presyncope, syncope, bleeding, or neurologic sequela. The patient is tolerating medications without difficulties and is otherwise without complaint today.    Past Medical History  Diagnosis Date  . Arthritis   . Sciatica   . Anxiety   . Hypertension   . Osteoporosis   . Cataract   . Asthma     Patient and wife both dinies patient has or had asthma  . Enlarged prostate   . Left atrial enlargement   . Persistent atrial fibrillation   . Obesity   . Bilateral pulmonary embolism 3/14    admitted to [facility],  treated with Xarelto  . Chronic pain   . Thrombus of left atrial appendage 03/09/2013  . Mitral regurgitation 04/24/2013    Mild by TEE  . Fatigue   . Dyspnea on exertion   . Polydipsia   . Anxiety disorder   . Benign neoplasm of colon   . Benign prostatic hyperplasia with urinary obstruction   . Chest pain   . Encounter for long-term (current) use of other medications   . Hyperlipemia   . Hypopotassemia   . Insomnia   . Major depressive disorder, recurrent episode   . Osteoarthritis   . Periodic limb movement disorder   . SOB (shortness of breath)   . Thoracic or lumbosacral neuritis or radiculitis, unspecified   . Ventral hernia, unspecified, without mention of obstruction or gangrene     right abdominal wall  . Restless leg syndrome     takes gabapentin  . Hepatitis C virus     patient denies  .  Dysrhythmia     afib   . OSA (obstructive sleep apnea)     noncompliant with CPAP.  07/27/13- awaiting a CPAP,  . Obstructive sleep apnea    Past Surgical History  Procedure Laterality Date  . Knee arthroscopy      left  . Colonoscopy w/ polypectomy    . Rotator cuff repair      right  . Tonsillectomy    . Cataract extraction    . Hernia repair  07/06/2011  . Replacement total knee Left   . Tee without cardioversion N/A 03/21/2012    Procedure: TRANSESOPHAGEAL ECHOCARDIOGRAM (TEE);  Surgeon: Birdie Riddle, MD;  Location: [facility];  Service: Cardiovascular;  Laterality: N/A;  . Cardioversion N/A 03/21/2012    Procedure: CARDIOVERSION;  Surgeon: Birdie Riddle, MD;  Location: [facility] ENDOSCOPY;  Service: Cardiovascular;  Laterality: N/A;  . Tee without cardioversion N/A 04/24/2013    Procedure: TRANSESOPHAGEAL ECHOCARDIOGRAM (TEE);  Surgeon: Dorothy Spark, MD;  Location: [facility];  Service: Cardiovascular;  Laterality: N/A;  . Cardioversion N/A 04/24/2013    Procedure: CARDIOVERSION;  Surgeon: Dorothy Spark, MD;  Location: [facility];  Service: Cardiovascular;  Laterality: N/A;  . Eye surgery Right     cataract  . Femur fracture surgery    . Inguinal hernia repair Right 07/28/2013  Procedure: RIGHT INGUINAL HERNIA REPAIR;  Surgeon: Imogene Burn. Georgette Dover, MD;  Location: [facility];  Service: General;  Laterality: Right;  . Insertion of mesh Right 07/28/2013    Procedure: INSERTION OF MESH;  Surgeon: Imogene Burn. Georgette Dover, MD;  Location: [facility];  Service: General;  Laterality: Right;  . Rotator cuff repair Left 03/08/2014    DR SUPPLE  . Shoulder arthroscopy with rotator cuff repair and subacromial decompression Left 03/08/2014    Procedure: LEFT SHOULDER ARTHROSCOPY WITH ROTATOR CUFF REPAIR/SUBACROMIAL DECOMPRESSION/DISTAL CLAVICLE RESECTION;  Surgeon: Marin Shutter, MD;  Location: [facility];  Service: Orthopedics;  Laterality: Left;     Current Outpatient Prescriptions  Medication Sig  Dispense Refill  . amiodarone (PACERONE) 200 MG tablet Take 1 tablet (200 mg total) by mouth daily. 30 tablet 3  . escitalopram (LEXAPRO) 20 MG tablet Take 20 mg by mouth daily.    Marland Kitchen gabapentin (NEURONTIN) 800 MG tablet Takes 1  tablet daily at dinner, and take 1 tablet every night at bedtime. (Patient taking differently: Takes 1 tablet by mouth daily at dinner, and take 1 tablet by mouth every night at bedtime.) 60 tablet 11  . lidocaine (LIDODERM) 5 % Place 1 patch onto the skin daily as needed (Shoulder).     . tamsulosin (FLOMAX) 0.4 MG CAPS capsule Take 0.8 mg by mouth daily.    Marland Kitchen warfarin (COUMADIN) 5 MG tablet TAKE AS DIRECTED BY COUMADIN CLINIC 30 tablet 3   No current facility-administered medications for this visit.    Allergies:   Ace inhibitors   Social History:  The patient  reports that he has quit smoking. He has never used smokeless tobacco. He reports that he drinks alcohol. He reports that he does not use illicit drugs.   Family History:  The patient's  family history includes CVA in his other; Cancer in his father and paternal grandmother; Diabetes in his other.    ROS:  Please see the history of present illness.   All other systems are reviewed and negative.    PHYSICAL EXAM: VS:  BP 152/90 mmHg  Pulse 63  Ht 6\' 3"  (1.905 m)  Wt 244 lb 12.8 oz (111.041 kg)  BMI 30.60 kg/m2 , BMI Body mass index is 30.6 kg/(m^2). GEN: Well nourished, well developed, in no acute distress HEENT: normal Neck: no JVD, carotid bruits, or masses Cardiac: RRR; no murmurs, rubs, or gallops,no edema  Respiratory:  clear to auscultation bilaterally, normal work of breathing GI: soft, nontender, nondistended, + BS MS: no deformity or atrophy Skin: warm and dry  Neuro:  Strength and sensation are intact Psych: euthymic mood, full affect  EKG:  EKG is ordered today. The ekg ordered today shows sinus rhythm 63 bpm, PR 212, Qtc 476, otherwise normal ekg   Recent Labs: 02/22/2014: TSH  1.90 03/01/2014: ALT 20; BUN 12; Potassium 3.2*; Sodium 138 03/08/2014: Creatinine 0.69 03/09/2014: Hemoglobin 13.0; Platelets 132*    Lipid Panel     Component Value Date/Time   CHOL 170 03/17/2012 0432   TRIG 117 03/17/2012 0432   HDL 30* 03/17/2012 0432   CHOLHDL 5.7 03/17/2012 0432   VLDL 23 03/17/2012 0432   LDLCALC 117* 03/17/2012 0432     Wt Readings from Last 3 Encounters:  05/24/14 244 lb 12.8 oz (111.041 kg)  02/22/14 251 lb 6.4 oz (114.034 kg)  02/08/14 250 lb 6.4 oz (113.581 kg)      Other studies Reviewed: Additional studies/ records that were reviewed today include: echo  2015   Review of the above records today demonstrates: preserved EF   ASSESSMENT AND PLAN:  1.  Persistent afib Doing well Reduce amiodarone to 100mg  daily Continue lifelong anticoagulation with coumadin due to prior LA thrombus  2. HTN Above goal Add losartan today which has been shown to assist with atrial remodelling  3. OSA Stable No change required today   Current medicines are reviewed at length with the patient today.   The patient does not have concerns regarding his medicines.  The following changes were made today:  none  Follow-up with Roderic Palau NP every 3 months I will see in a year   Signed, Thompson Grayer, MD  05/24/2014 10:25 AM     [facility] [address] [place] [address] [phone] (office) [phone] (fax)

## 2014-05-24 NOTE — Patient Instructions (Signed)
Medication Instructions:  Your physician has recommended you make the following change in your medication:  1) Decrease Amiodarone to 100mg  daily 2) Start Losartan 25 mg daily   Labwork: None ordered  Testing/Procedures: None ordered  Follow-Up: Your physician recommends that you schedule a follow-up appointment in 3 months with Roderic Palau, NP and 12 months with Dr Rayann Heman   Any Other Special Instructions Will Be Listed Below (If Applicable).

## 2014-05-26 ENCOUNTER — Encounter (HOSPITAL_COMMUNITY): Payer: Self-pay | Admitting: Emergency Medicine

## 2014-05-26 ENCOUNTER — Emergency Department (HOSPITAL_COMMUNITY): Payer: Medicare Other

## 2014-05-26 ENCOUNTER — Emergency Department (HOSPITAL_COMMUNITY)
Admission: EM | Admit: 2014-05-26 | Discharge: 2014-05-26 | Disposition: A | Discharge status: Home / Self Care | Payer: Medicare Other | Attending: Emergency Medicine | Admitting: Emergency Medicine

## 2014-05-26 DIAGNOSIS — G2581 Restless legs syndrome: Secondary | ICD-10-CM | POA: Insufficient documentation

## 2014-05-26 DIAGNOSIS — N400 Enlarged prostate without lower urinary tract symptoms: Secondary | ICD-10-CM | POA: Diagnosis not present

## 2014-05-26 DIAGNOSIS — R5383 Other fatigue: Secondary | ICD-10-CM | POA: Insufficient documentation

## 2014-05-26 DIAGNOSIS — R002 Palpitations: Secondary | ICD-10-CM | POA: Diagnosis not present

## 2014-05-26 DIAGNOSIS — Z79899 Other long term (current) drug therapy: Secondary | ICD-10-CM | POA: Insufficient documentation

## 2014-05-26 DIAGNOSIS — G4733 Obstructive sleep apnea (adult) (pediatric): Secondary | ICD-10-CM | POA: Diagnosis not present

## 2014-05-26 DIAGNOSIS — I10 Essential (primary) hypertension: Secondary | ICD-10-CM | POA: Insufficient documentation

## 2014-05-26 DIAGNOSIS — M1990 Unspecified osteoarthritis, unspecified site: Secondary | ICD-10-CM | POA: Diagnosis not present

## 2014-05-26 DIAGNOSIS — G8929 Other chronic pain: Secondary | ICD-10-CM | POA: Diagnosis not present

## 2014-05-26 DIAGNOSIS — Z9981 Dependence on supplemental oxygen: Secondary | ICD-10-CM | POA: Diagnosis not present

## 2014-05-26 DIAGNOSIS — Z86018 Personal history of other benign neoplasm: Secondary | ICD-10-CM | POA: Insufficient documentation

## 2014-05-26 DIAGNOSIS — R079 Chest pain, unspecified: Secondary | ICD-10-CM | POA: Diagnosis present

## 2014-05-26 DIAGNOSIS — Z8619 Personal history of other infectious and parasitic diseases: Secondary | ICD-10-CM | POA: Insufficient documentation

## 2014-05-26 DIAGNOSIS — R110 Nausea: Secondary | ICD-10-CM | POA: Insufficient documentation

## 2014-05-26 DIAGNOSIS — Z87891 Personal history of nicotine dependence: Secondary | ICD-10-CM | POA: Insufficient documentation

## 2014-05-26 DIAGNOSIS — I160 Hypertensive urgency: Secondary | ICD-10-CM

## 2014-05-26 DIAGNOSIS — Z86711 Personal history of pulmonary embolism: Secondary | ICD-10-CM | POA: Diagnosis not present

## 2014-05-26 DIAGNOSIS — J45909 Unspecified asthma, uncomplicated: Secondary | ICD-10-CM | POA: Insufficient documentation

## 2014-05-26 DIAGNOSIS — F419 Anxiety disorder, unspecified: Secondary | ICD-10-CM | POA: Diagnosis not present

## 2014-05-26 DIAGNOSIS — Z8719 Personal history of other diseases of the digestive system: Secondary | ICD-10-CM | POA: Insufficient documentation

## 2014-05-26 DIAGNOSIS — E669 Obesity, unspecified: Secondary | ICD-10-CM | POA: Insufficient documentation

## 2014-05-26 LAB — PROTIME-INR
INR: 2.15 — ABNORMAL HIGH (ref 0.00–1.49)
Prothrombin Time: 23.8 seconds — ABNORMAL HIGH (ref 11.6–15.2)

## 2014-05-26 LAB — CBC WITH DIFFERENTIAL/PLATELET
Basophils Absolute: 0.1 10*3/uL (ref 0.0–0.1)
Basophils Relative: 1 % (ref 0–1)
Eosinophils Absolute: 0.3 10*3/uL (ref 0.0–0.7)
Eosinophils Relative: 4 % (ref 0–5)
HCT: 43.3 % (ref 39.0–52.0)
Hemoglobin: 14.9 g/dL (ref 13.0–17.0)
Lymphocytes Relative: 20 % (ref 12–46)
Lymphs Abs: 1.5 10*3/uL (ref 0.7–4.0)
MCH: 30.4 pg (ref 26.0–34.0)
MCHC: 34.4 g/dL (ref 30.0–36.0)
MCV: 88.4 fL (ref 78.0–100.0)
Monocytes Absolute: 0.7 10*3/uL (ref 0.1–1.0)
Monocytes Relative: 9 % (ref 3–12)
Neutro Abs: 5 10*3/uL (ref 1.7–7.7)
Neutrophils Relative %: 66 % (ref 43–77)
Platelets: 167 10*3/uL (ref 150–400)
RBC: 4.9 MIL/uL (ref 4.22–5.81)
RDW: 13.7 % (ref 11.5–15.5)
WBC: 7.6 10*3/uL (ref 4.0–10.5)

## 2014-05-26 LAB — I-STAT CHEM 8, ED
BUN: 17 mg/dL (ref 6–20)
Calcium, Ion: 1.13 mmol/L (ref 1.13–1.30)
Chloride: 101 mmol/L (ref 101–111)
Creatinine, Ser: 1.1 mg/dL (ref 0.61–1.24)
Glucose, Bld: 95 mg/dL (ref 65–99)
HCT: 45 % (ref 39.0–52.0)
Hemoglobin: 15.3 g/dL (ref 13.0–17.0)
Potassium: 2.9 mmol/L — ABNORMAL LOW (ref 3.5–5.1)
Sodium: 140 mmol/L (ref 135–145)
TCO2: 25 mmol/L (ref 0–100)

## 2014-05-26 LAB — I-STAT TROPONIN, ED: Troponin i, poc: 0.01 ng/mL (ref 0.00–0.08)

## 2014-05-26 LAB — TROPONIN I: Troponin I: 0.03 ng/mL (ref <0.031)

## 2014-05-26 MED ORDER — NITROGLYCERIN IN D5W 200-5 MCG/ML-% IV SOLN
5.0000 ug/min | Freq: Once | INTRAVENOUS | Status: DC
  Filled 2014-05-26: qty 250

## 2014-05-26 MED ORDER — LABETALOL HCL 5 MG/ML IV SOLN
20.0000 mg | INTRAVENOUS | Status: DC | PRN
  Administered 2014-05-26 (×2): 20 mg via INTRAVENOUS
  Filled 2014-05-26 (×2): qty 4

## 2014-05-26 MED ORDER — LORAZEPAM 2 MG/ML IJ SOLN
1.0000 mg | Freq: Once | INTRAMUSCULAR | Status: AC
  Administered 2014-05-26: 1 mg via INTRAVENOUS
  Filled 2014-05-26: qty 1

## 2014-05-26 MED ORDER — HYDRALAZINE HCL 20 MG/ML IJ SOLN
10.0000 mg | INTRAMUSCULAR | Status: AC
  Administered 2014-05-26: 10 mg via INTRAVENOUS
  Filled 2014-05-26: qty 1

## 2014-05-26 MED ORDER — LORAZEPAM 1 MG PO TABS
1.0000 mg | ORAL_TABLET | Freq: Once | ORAL | Status: AC
  Administered 2014-05-26: 1 mg via ORAL
  Filled 2014-05-26: qty 1

## 2014-05-26 MED ORDER — CLONIDINE HCL 0.1 MG PO TABS
0.1000 mg | ORAL_TABLET | Freq: Once | ORAL | Status: AC
  Administered 2014-05-26: 0.1 mg via ORAL
  Filled 2014-05-26: qty 1

## 2014-05-26 MED ORDER — LABETALOL HCL 5 MG/ML IV SOLN
10.0000 mg | INTRAVENOUS | Status: DC | PRN
  Administered 2014-05-26: 10 mg via INTRAVENOUS
  Filled 2014-05-26: qty 4

## 2014-05-26 MED ORDER — POTASSIUM CHLORIDE CRYS ER 20 MEQ PO TBCR
60.0000 meq | EXTENDED_RELEASE_TABLET | Freq: Once | ORAL | Status: AC
  Administered 2014-05-26: 60 meq via ORAL
  Filled 2014-05-26: qty 3

## 2014-05-26 MED ORDER — LORAZEPAM 1 MG PO TABS: 1.0000 mg | ORAL_TABLET | Freq: Three times a day (TID) | ORAL | Status: DC | PRN

## 2014-05-26 NOTE — ED Notes (Signed)
Per MD hold Nitro to see if BP continues to drop.

## 2014-05-26 NOTE — Discharge Instructions (Signed)
As discussed, it is very important that you monitor your condition carefully. However, unless you develop new, or concerning changes in your condition, do not check your blood pressure for 3 days, in an effort to give the new medication a chance to have an effect.  Return here if you do develop new, or concerning changes in your condition.   Hypertension Hypertension, commonly called high blood pressure, is when the force of blood pumping through your arteries is too strong. Your arteries are the blood vessels that carry blood from your heart throughout your body. A blood pressure reading consists of a higher number over a lower number, such as 110/72. The higher number (systolic) is the pressure inside your arteries when your heart pumps. The lower number (diastolic) is the pressure inside your arteries when your heart relaxes. Ideally you want your blood pressure below 120/80. Hypertension forces your heart to work harder to pump blood. Your arteries may become narrow or stiff. Having hypertension puts you at risk for heart disease, stroke, and other problems.  RISK FACTORS Some risk factors for high blood pressure are controllable. Others are not.  Risk factors you cannot control include:   Race. You may be at higher risk if you are African American.  Age. Risk increases with age.  Gender. Men are at higher risk than women before age 87 years. After age 29, women are at higher risk than men. Risk factors you can control include:  Not getting enough exercise or physical activity.  Being overweight.  Getting too much fat, sugar, calories, or salt in your diet.  Drinking too much alcohol. SIGNS AND SYMPTOMS Hypertension does not usually cause signs or symptoms. Extremely high blood pressure (hypertensive crisis) may cause headache, anxiety, shortness of breath, and nosebleed. DIAGNOSIS  To check if you have hypertension, your health care provider will measure your blood pressure while  you are seated, with your arm held at the level of your heart. It should be measured at least twice using the same arm. Certain conditions can cause a difference in blood pressure between your right and left arms. A blood pressure reading that is higher than normal on one occasion does not mean that you need treatment. If one blood pressure reading is high, ask your health care provider about having it checked again. TREATMENT  Treating high blood pressure includes making lifestyle changes and possibly taking medicine. Living a healthy lifestyle can help lower high blood pressure. You may need to change some of your habits. Lifestyle changes may include:  Following the DASH diet. This diet is high in fruits, vegetables, and whole grains. It is low in salt, red meat, and added sugars.  Getting at least 2 hours of brisk physical activity every week.  Losing weight if necessary.  Not smoking.  Limiting alcoholic beverages.  Learning ways to reduce stress. If lifestyle changes are not enough to get your blood pressure under control, your health care provider may prescribe medicine. You may need to take more than one. Work closely with your health care provider to understand the risks and benefits. HOME CARE INSTRUCTIONS  Have your blood pressure rechecked as directed by your health care provider.   Take medicines only as directed by your health care provider. Follow the directions carefully. Blood pressure medicines must be taken as prescribed. The medicine does not work as well when you skip doses. Skipping doses also puts you at risk for problems.   Do not smoke.   Monitor your  blood pressure at home as directed by your health care provider. SEEK MEDICAL CARE IF:   You think you are having a reaction to medicines taken.  You have recurrent headaches or feel dizzy.  You have swelling in your ankles.  You have trouble with your vision. SEEK IMMEDIATE MEDICAL CARE IF:  You  develop a severe headache or confusion.  You have unusual weakness, numbness, or feel faint.  You have severe chest or abdominal pain.  You vomit repeatedly.  You have trouble breathing. MAKE SURE YOU:   Understand these instructions.  Will watch your condition.  Will get help right away if you are not doing well or get worse. Document Released: 12/22/2004 Document Revised: 05/08/2013 Document Reviewed: 10/14/2012 [facility] Patient Information 2015 [place], [place]. This information is not intended to replace advice given to you by your health care provider. Make sure you discuss any questions you have with your health care provider.

## 2014-05-26 NOTE — ED Notes (Signed)
Pt st's he check his blood pressure today and it was elevated.  St's he looked it up on the internet and it said to go to hospital.  Pt also  C/o chest feeling tight.  Pt appears anxious, st's he's scared

## 2014-05-26 NOTE — ED Notes (Signed)
Pt tearful and stated that he is getting "antsy", EDP aware

## 2014-05-26 NOTE — ED Provider Notes (Signed)
CSN: [id number]     Arrival date & time 05/26/14  1459 History   First MD Initiated Contact with Patient 05/26/14 1604     Chief Complaint  Patient presents with  . Chest Pain     (Consider location/radiation/quality/duration/timing/severity/associated sxs/prior Treatment) HPI patient presents with concern of chest pressure, palpitations, fatigue. He was generally well until yesterday, approximately 24 hours ago when he began to feel generally unwell. The day prior, the patient saw his cardiologist for annual evaluation. There he was started on a new medication, losartan. First dose today. He does not feel differential yesterday. Since that time he has had fatigue, and gradually developed anterior chest pressure, palpitations, without dyspnea, syncope. There is nausea, but no vomiting, diarrhea, belly pain, fever, chills. No new diet, activity. Patient is retired. No clear alleviating or exacerbating factors.  Past Medical History  Diagnosis Date  . Arthritis   . Sciatica   . Anxiety   . Hypertension   . Osteoporosis   . Cataract   . Asthma     Patient and wife both dinies patient has or had asthma  . Enlarged prostate   . Left atrial enlargement   . Persistent atrial fibrillation   . Obesity   . Bilateral pulmonary embolism 3/14    admitted to [facility],  treated with Xarelto  . Chronic pain   . Thrombus of left atrial appendage 03/09/2013  . Mitral regurgitation 04/24/2013    Mild by TEE  . Fatigue   . Dyspnea on exertion   . Polydipsia   . Anxiety disorder   . Benign neoplasm of colon   . Benign prostatic hyperplasia with urinary obstruction   . Chest pain   . Encounter for long-term (current) use of other medications   . Hyperlipemia   . Hypopotassemia   . Insomnia   . Major depressive disorder, recurrent episode   . Osteoarthritis   . Periodic limb movement disorder   . SOB (shortness of breath)   . Thoracic or lumbosacral neuritis or radiculitis,  unspecified   . Ventral hernia, unspecified, without mention of obstruction or gangrene     right abdominal wall  . Restless leg syndrome     takes gabapentin  . Hepatitis C virus     patient denies  . Dysrhythmia     afib   . OSA (obstructive sleep apnea)     noncompliant with CPAP.  07/27/13- awaiting a CPAP,  . Obstructive sleep apnea    Past Surgical History  Procedure Laterality Date  . Knee arthroscopy      left  . Colonoscopy w/ polypectomy    . Rotator cuff repair      right  . Tonsillectomy    . Cataract extraction    . Hernia repair  07/06/2011  . Replacement total knee Left   . Tee without cardioversion N/A 03/21/2012    Procedure: TRANSESOPHAGEAL ECHOCARDIOGRAM (TEE);  Surgeon: Birdie Riddle, MD;  Location: [place];  Service: Cardiovascular;  Laterality: N/A;  . Cardioversion N/A 03/21/2012    Procedure: CARDIOVERSION;  Surgeon: Birdie Riddle, MD;  Location: [facility] ENDOSCOPY;  Service: Cardiovascular;  Laterality: N/A;  . Tee without cardioversion N/A 04/24/2013    Procedure: TRANSESOPHAGEAL ECHOCARDIOGRAM (TEE);  Surgeon: Dorothy Spark, MD;  Location: [place];  Service: Cardiovascular;  Laterality: N/A;  . Cardioversion N/A 04/24/2013    Procedure: CARDIOVERSION;  Surgeon: Dorothy Spark, MD;  Location: [place];  Service: Cardiovascular;  Laterality: N/A;  .  Eye surgery Right     cataract  . Femur fracture surgery    . Inguinal hernia repair Right 07/28/2013    Procedure: RIGHT INGUINAL HERNIA REPAIR;  Surgeon: Imogene Burn. Georgette Dover, MD;  Location: [place];  Service: General;  Laterality: Right;  . Insertion of mesh Right 07/28/2013    Procedure: INSERTION OF MESH;  Surgeon: Imogene Burn. Georgette Dover, MD;  Location: [place];  Service: General;  Laterality: Right;  . Rotator cuff repair Left 03/08/2014    DR SUPPLE  . Shoulder arthroscopy with rotator cuff repair and subacromial decompression Left 03/08/2014    Procedure: LEFT SHOULDER ARTHROSCOPY WITH ROTATOR CUFF  REPAIR/SUBACROMIAL DECOMPRESSION/DISTAL CLAVICLE RESECTION;  Surgeon: Marin Shutter, MD;  Location: [place];  Service: Orthopedics;  Laterality: Left;   Family History  Problem Relation Age of Onset  . Cancer Father     bone  . Cancer Paternal Grandmother     ovarian  . CVA Other     Fam Hx of multiple myeloma  . Diabetes Other     Fam Hx of DM   History  Substance Use Topics  . Smoking status: Former Research scientist (life sciences)  . Smokeless tobacco: Never Used  . Alcohol Use: 0.0 oz/week    0 Standard drinks or equivalent per week     Comment: 2 drinks per night    Review of Systems  Constitutional:       Per HPI, otherwise negative  HENT:       Per HPI, otherwise negative  Respiratory:       Per HPI, otherwise negative  Cardiovascular:       Per HPI, otherwise negative  Gastrointestinal: Negative for vomiting.  Endocrine:       Negative aside from HPI  Genitourinary:       Neg aside from HPI   Musculoskeletal:       Per HPI, otherwise negative  Skin: Negative.   Neurological: Negative for syncope.      Allergies  Ace inhibitors  Home Medications   Prior to Admission medications   Medication Sig Start Date End Date Taking? Authorizing Provider  amiodarone (PACERONE) 200 MG tablet Take 0.5 tablets (100 mg total) by mouth daily. 05/24/14   Thompson Grayer, MD  escitalopram (LEXAPRO) 20 MG tablet Take 20 mg by mouth daily.    Historical Provider, MD  gabapentin (NEURONTIN) 800 MG tablet Takes 1  tablet daily at dinner, and take 1 tablet every night at bedtime. Patient taking differently: Takes 1 tablet by mouth daily at dinner, and take 1 tablet by mouth every night at bedtime. 02/08/14   Britt Bottom, MD  lidocaine (LIDODERM) 5 % Place 1 patch onto the skin daily as needed (Shoulder).  02/08/14   Historical Provider, MD  losartan (COZAAR) 25 MG tablet Take 1 tablet (25 mg total) by mouth daily. 05/24/14   Thompson Grayer, MD  tamsulosin (FLOMAX) 0.4 MG CAPS capsule Take 0.8 mg by mouth  daily.    Historical Provider, MD  warfarin (COUMADIN) 5 MG tablet TAKE AS DIRECTED BY COUMADIN CLINIC 04/09/14   Thompson Grayer, MD   BP 172/116 mmHg  Pulse 82  Temp(Src) 98.2 F (36.8 C) (Oral)  Resp 20  Ht 6' 3"  (1.905 m)  Wt 243 lb 6 oz (110.394 kg)  BMI 30.42 kg/m2  SpO2 95% Physical Exam  Constitutional: He is oriented to person, place, and time. He appears well-developed. No distress.  HENT:  Head: Normocephalic and atraumatic.  Eyes: Conjunctivae and  EOM are normal.  Cardiovascular: Normal rate and regular rhythm.   Pulmonary/Chest: Effort normal. No stridor. No respiratory distress.  Abdominal: He exhibits no distension.  Musculoskeletal: He exhibits no edema.  Neurological: He is alert and oriented to person, place, and time.  Skin: Skin is warm and dry.  Psychiatric: He has a normal mood and affect.  Nursing note and vitals reviewed.   ED Course  Procedures (including critical care time) Labs Review Labs Reviewed  I-STAT CHEM 8, ED - Abnormal; Notable for the following:    Potassium 2.9 (*)    All other components within normal limits  CBC WITH DIFFERENTIAL/PLATELET  PROTIME-INR  Randolm Idol, ED    Imaging Review Dg Chest 2 View  05/26/2014   CLINICAL DATA:  Patient with centralized chest pain.  EXAM: CHEST  2 VIEW  COMPARISON:  Chest radiograph 03/16/2012  FINDINGS: Stable enlarged cardiac and mediastinal contours. No consolidative pulmonary opacities. No pleural effusion or pneumothorax. Old right mid clavicle fracture.  IMPRESSION: No acute cardiopulmonary process.   Electronically Signed   By: Lovey Newcomer M.D.   On: 05/26/2014 15:57     EKG Interpretation   Date/Time:  Saturday May 26 2014 15:03:29 EDT Ventricular Rate:  99 PR Interval:  190 QRS Duration: 112 QT Interval:  394 QTC Calculation: 505 R Axis:   -24 Text Interpretation:  Sinus rhythm with frequent Premature ventricular  complexes Prolonged QT Abnormal ECG Sinus rhythm Premature  ventricular  complexes Artifact Abnormal ekg Confirmed by Carmin Muskrat  MD [phone])  on 05/26/2014 4:06:32 PM     Patient is hypertensive on evaluation, 180/120. Labetalol will be provided.     5:48 PM Patient pain-free, but remains hypertensive after initial labetalol. Patient is hypokalemic. Potassium repletion provided.  9:19 PM Following Labetalol x3, hydralazine, clonidine, ativan x2, he remains hypertensive. 168/96  9:58 PM BP 164/85. Patient smiling, states "I feel good". We discussed the need to not take his blood pressure at home for 3 days, to give his new medication a chance to work, but also discussed the importance of return precautions, and primary care follow-up this week.  MDM   Patient presents with chest pain, hypertension. Patient required multiple interventions to decrease his blood pressure, including Ativan, clonidine, labetalol, hydralazine. Patient's blood pressure did decline, and he had no ongoing chest pain. With serial negative troponins, and no lab evidence of endorgan hypertensive effects once he improved, he was appropriate for discharge with outpatient follow-up.     Carmin Muskrat, MD 05/26/14 2159

## 2014-05-26 NOTE — ED Notes (Signed)
Sandwich and water given to patient

## 2014-05-28 ENCOUNTER — Telehealth (HOSPITAL_COMMUNITY): Payer: Self-pay | Admitting: *Deleted

## 2014-05-28 NOTE — Telephone Encounter (Signed)
Pt called to inform of his visit in ER over weekend regarding hypertension. Was asking if this is something afib clinic should monitor - instructed him to contact his primary care physician regarding hypertension but if issues with afib arise do not hesitate to call. Patient was appreciative of my call.

## 2014-06-12 ENCOUNTER — Encounter: Payer: Self-pay | Admitting: Neurology

## 2014-06-12 ENCOUNTER — Ambulatory Visit (INDEPENDENT_AMBULATORY_CARE_PROVIDER_SITE_OTHER): Payer: Medicare Other | Admitting: Neurology

## 2014-06-12 VITALS — BP 178/106 | HR 68 | Resp 16 | Ht 75.0 in | Wt 248.0 lb

## 2014-06-12 DIAGNOSIS — G2581 Restless legs syndrome: Secondary | ICD-10-CM

## 2014-06-12 DIAGNOSIS — I4819 Other persistent atrial fibrillation: Secondary | ICD-10-CM

## 2014-06-12 DIAGNOSIS — R3915 Urgency of urination: Secondary | ICD-10-CM | POA: Diagnosis not present

## 2014-06-12 DIAGNOSIS — G4733 Obstructive sleep apnea (adult) (pediatric): Secondary | ICD-10-CM

## 2014-06-12 DIAGNOSIS — I10 Essential (primary) hypertension: Secondary | ICD-10-CM

## 2014-06-12 DIAGNOSIS — I481 Persistent atrial fibrillation: Secondary | ICD-10-CM | POA: Diagnosis not present

## 2014-06-12 MED ORDER — DIAZEPAM 5 MG PO TABS: 5.0000 mg | ORAL_TABLET | Freq: Every day | ORAL | Status: DC

## 2014-06-12 MED ORDER — GABAPENTIN 800 MG PO TABS: 800.0000 mg | ORAL_TABLET | ORAL | Status: DC

## 2014-06-12 NOTE — Progress Notes (Signed)
[facility]  PATIENT: Brandon [name]. DOB: 1950-11-01  REFERRING CLINICIAN: Scarlette Ar  HISTORY FROM: Patient  REASON FOR VISIT: OSA and RLS   HISTORICAL  CHIEF COMPLAINT:  Chief Complaint  Patient presents with  . Sleep Apnea    Sts. he has not had home sleep study--since last visit he has had hernia repair and rotator cuff repair--sts. after he healed from those surgeries, he called to schedule home sleep study, but was not able to contact anyone here to schedule it.  Sts. he stopped taking Valium--sts. he thought it was contributing to hypertension.  RLS sx. have been worse.  Sts. he continues Gabapentin./fim  . Restless Leg Syndrome    HISTORY OF PRESENT ILLNESS:  Brandon [name] is 64 yo man with obstructive sleep apnea and restless leg syndrome.  OSA:   He was diagnosed with OSA but didn't tolerate CPAP and turned it in (2015).   He was unable CPAP but had issues getting a home sleep study.    He felt mildly claustophobic and was uncomfortable with CPAP so he often took mask off in middle of the night.   As he thinks he could tolerate CPAP better than an oral appliance and he has continued HTN issues, he would like to try CPAP again.    He had moderate OSA on the past and has since lost 60 pounds.   He does not know the pressure used     RLS/PLMS:   He also had severe RLS/PLMS.    He feels it did  much better at night with diazepam and gabapentin at bedtime and tolerates these well.  However, when he had BP med changes, he had a lot of symptoms including feeling a bit foggy and he stopped valium to see if he felt better.     Nocturia:   He often wakes up 2-3 times to urinate.   He has hesitancy and urgency and is doing much better on Flomax (was having nocturia 4-5 times nightly).    REVIEW OF SYSTEMS:  Constitutional: No fevers, chills, sweats, or change in appetite Eyes: No visual changes, double vision, eye pain Ear, nose and throat: No hearing loss,  ear pain, nasal congestion, sore throat.   Notes tinnitus. Cardiovascular: No chest pain, palpitations Respiratory:  No shortness of breath at rest or with exertion.   No wheezes GastrointestinaI: No nausea, vomiting, diarrhea, abdominal pain, fecal incontinence Genitourinary:  No dysuria, urinary retention or frequency.  No nocturia. Musculoskeletal:  No neck pain, back pain.   Has right knee pain Integumentary: No rash, pruritus, skin lesions Neurological: as above Psychiatric: No depression at this time.  No anxiety Endocrine: No palpitations, diaphoresis, change in appetite, change in weigh or increased thirst Hematologic/Lymphatic:  No anemia, purpura, petechiae. Allergic/Immunologic: No itchy/runny eyes, nasal congestion, recent allergic reactions, rashes  ALLERGIES: Allergies  Allergen Reactions  . Ace Inhibitors Cough    HOME MEDICATIONS: Outpatient Prescriptions Prior to Visit  Medication Sig Dispense Refill  . amiodarone (PACERONE) 200 MG tablet Take 0.5 tablets (100 mg total) by mouth daily. 30 tablet 3  . escitalopram (LEXAPRO) 20 MG tablet Take 20 mg by mouth daily.    Marland Kitchen gabapentin (NEURONTIN) 800 MG tablet Takes 1  tablet daily at dinner, and take 1 tablet every night at bedtime. (Patient taking differently: Take 800 mg by mouth See admin instructions. Takes 1 tablet (800 mg) by mouth daily with supper, and take 1 tablet (800 mg)  at bedtime as needed for restless legs) 60 tablet 11  . losartan (COZAAR) 25 MG tablet Take 1 tablet (25 mg total) by mouth daily. 90 tablet 3  . naproxen sodium (ANAPROX) 220 MG tablet Take 440 mg by mouth daily as needed (pain). ALEVE    . tamsulosin (FLOMAX) 0.4 MG CAPS capsule Take 0.4 mg by mouth 2 (two) times daily.     Marland Kitchen warfarin (COUMADIN) 5 MG tablet TAKE AS DIRECTED BY COUMADIN CLINIC (Patient taking differently: TAKE 1/2 TABLET (2.5 MG)ON MON., AND FRI., WITH SUPPER, TAKE 1 TABLET (5 MG) ON SUN., TUES., WED., THURS., SAT., WITH SUPPER OR   AS DIRECTED BY COUMADIN CLINIC) 30 tablet 3  . diazepam (VALIUM) 5 MG tablet Take 5 mg by mouth at bedtime.     . lidocaine (LIDODERM) 5 % Place 1 patch onto the skin daily as needed (knee pain).     . LORazepam (ATIVAN) 1 MG tablet Take 1 tablet (1 mg total) by mouth 3 (three) times daily as needed for anxiety. (Patient not taking: Reported on 06/12/2014) 15 tablet 0   No facility-administered medications prior to visit.    PAST MEDICAL HISTORY: Past Medical History  Diagnosis Date  . Arthritis   . Sciatica   . Anxiety   . Hypertension   . Osteoporosis   . Cataract   . Asthma     Patient and wife both dinies patient has or had asthma  . Enlarged prostate   . Left atrial enlargement   . Persistent atrial fibrillation   . Obesity   . Bilateral pulmonary embolism 3/14    admitted to [facility],  treated with Xarelto  . Chronic pain   . Thrombus of left atrial appendage 03/09/2013  . Mitral regurgitation 04/24/2013    Mild by TEE  . Fatigue   . Dyspnea on exertion   . Polydipsia   . Anxiety disorder   . Benign neoplasm of colon   . Benign prostatic hyperplasia with urinary obstruction   . Chest pain   . Encounter for long-term (current) use of other medications   . Hyperlipemia   . Hypopotassemia   . Insomnia   . Major depressive disorder, recurrent episode   . Osteoarthritis   . Periodic limb movement disorder   . SOB (shortness of breath)   . Thoracic or lumbosacral neuritis or radiculitis, unspecified   . Ventral hernia, unspecified, without mention of obstruction or gangrene     right abdominal wall  . Restless leg syndrome     takes gabapentin  . Hepatitis C virus     patient denies  . Dysrhythmia     afib   . OSA (obstructive sleep apnea)     noncompliant with CPAP.  07/27/13- awaiting a CPAP,  . Obstructive sleep apnea     PAST SURGICAL HISTORY: Past Surgical History  Procedure Laterality Date  . Knee arthroscopy      left  . Colonoscopy w/ polypectomy      . Rotator cuff repair      right  . Tonsillectomy    . Cataract extraction    . Hernia repair  07/06/2011  . Replacement total knee Left   . Tee without cardioversion N/A 03/21/2012    Procedure: TRANSESOPHAGEAL ECHOCARDIOGRAM (TEE);  Surgeon: Birdie Riddle, MD;  Location: [place];  Service: Cardiovascular;  Laterality: N/A;  . Cardioversion N/A 03/21/2012    Procedure: CARDIOVERSION;  Surgeon: Birdie Riddle, MD;  Location: [place];  Service: Cardiovascular;  Laterality: N/A;  . Tee without cardioversion N/A 04/24/2013    Procedure: TRANSESOPHAGEAL ECHOCARDIOGRAM (TEE);  Surgeon: Dorothy Spark, MD;  Location: [place];  Service: Cardiovascular;  Laterality: N/A;  . Cardioversion N/A 04/24/2013    Procedure: CARDIOVERSION;  Surgeon: Dorothy Spark, MD;  Location: [place];  Service: Cardiovascular;  Laterality: N/A;  . Eye surgery Right     cataract  . Femur fracture surgery    . Inguinal hernia repair Right 07/28/2013    Procedure: RIGHT INGUINAL HERNIA REPAIR;  Surgeon: Imogene Burn. Georgette Dover, MD;  Location: [place];  Service: General;  Laterality: Right;  . Insertion of mesh Right 07/28/2013    Procedure: INSERTION OF MESH;  Surgeon: Imogene Burn. Georgette Dover, MD;  Location: [place];  Service: General;  Laterality: Right;  . Rotator cuff repair Left 03/08/2014    DR SUPPLE  . Shoulder arthroscopy with rotator cuff repair and subacromial decompression Left 03/08/2014    Procedure: LEFT SHOULDER ARTHROSCOPY WITH ROTATOR CUFF REPAIR/SUBACROMIAL DECOMPRESSION/DISTAL CLAVICLE RESECTION;  Surgeon: Marin Shutter, MD;  Location: [place];  Service: Orthopedics;  Laterality: Left;    FAMILY HISTORY: Family History  Problem Relation Age of Onset  . Cancer Father     bone  . Cancer Paternal Grandmother     ovarian  . CVA Other     Fam Hx of multiple myeloma  . Diabetes Other     Fam Hx of DM    SOCIAL HISTORY:  History   Social History  . Marital Status: Married    Spouse Name: N/A   . Number of Children: N/A  . Years of Education: N/A   Occupational History  . Not on file.   Social History Main Topics  . Smoking status: Former Research scientist (life sciences)  . Smokeless tobacco: Never Used  . Alcohol Use: 0.0 oz/week    0 Standard drinks or equivalent per week     Comment: 2 drinks per night  . Drug Use: No     Comment: negative hx for IV drug abuse  . Sexual Activity: Not on file   Other Topics Concern  . 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   General: The patient is well-developed and well-nourished and in no acute distress  Neck: The neck is supple.  The neck is nontender.   Pharynx is Mallampati 1  Neurologic Exam  Mental status: The patient is alert and oriented x 3 at the time of the examination. The patient has apparent normal recent and remote memory, with an apparently normal attention span and concentration ability.   Cranial nerves: Extraocular movements are full.  Facial symmetry is present. Facial strength is normal. No dysarthria is noted.  The tongue is midline, and the patient has symmetric elevation of the soft palate.   Motor:  Muscle bulk and tone are normal. Strength is  5 / 5 in all 4 extremities.   Sensory: Sensory testing is intact to touch on all 4 extremities.  Gait and station: Station and gait are normal. Tandem gait is normal.   Reflexes: Deep tendon reflexes are symmetric and normal bilaterally. Plantar responses are  normal.    DIAGNOSTIC DATA (LABS, IMAGING, TESTING) - I reviewed patient records, labs, notes, testing and imaging myself where available.  Lab Results  Component Value Date   WBC 7.6 05/26/2014   HGB 15.3 05/26/2014   HCT 45.0 05/26/2014   MCV 88.4 05/26/2014   PLT  167 05/26/2014      Component Value Date/Time   NA 140 05/26/2014 1550   K 2.9* 05/26/2014 1550   CL 101 05/26/2014 1550   CO2 34* 03/01/2014 0937   GLUCOSE 95 05/26/2014 1550   BUN 17 05/26/2014 1550   CREATININE 1.10 05/26/2014 1550   CALCIUM 9.0 03/01/2014 0937   PROT 7.3 03/01/2014 0937   ALBUMIN 4.3 03/01/2014 0937   AST 24 03/01/2014 0937   ALT 20 03/01/2014 0937   ALKPHOS 62 03/01/2014 0937   BILITOT 0.8 03/01/2014 0937   GFRNONAA >90 03/08/2014 1644   GFRAA >90 03/08/2014 1644   Lab Results  Component Value Date   CHOL 170 03/17/2012   HDL 30* 03/17/2012   LDLCALC 117* 03/17/2012   TRIG 117 03/17/2012   CHOLHDL 5.7 03/17/2012   Lab Results  Component Value Date   HGBA1C 5.6 03/16/2012   No results found for: VITAMINB12 Lab Results  Component Value Date   TSH 1.90 02/22/2014   No components found for: VITAMIND     ASSESSMENT AND PLAN  Obstructive sleep apnea  Restless leg syndrome  Urinary urgency  Essential hypertension  Persistent atrial fibrillation  1.   We will set up a home sleep study and set up an AutoPap based on the findings.   Treatment may help AFib and HTN and nocturia 2.  His restless leg syndrome/PLMS did best on the combination of gabapentin in the evening and bedtime and Valium at bedtime. Continue on those medicines and I will re-prescribe 3.  Continue Flomax.   Consider night time oxybutynin for nocturia/urgency  He will return to see me in 4 months or sooner if he has new or worsening neurologic symptoms.   Richard A. Felecia Shelling, MD, PhD 0/03/4959, 1:64 AM Certified in Neurology, Clinical Neurophysiology, Sleep Medicine, Pain Medicine and Neuroimaging  [facility] [address] [place], [place] [address] [phone]

## 2014-06-19 ENCOUNTER — Ambulatory Visit (INDEPENDENT_AMBULATORY_CARE_PROVIDER_SITE_OTHER): Payer: Medicare Other | Admitting: *Deleted

## 2014-06-19 DIAGNOSIS — I749 Embolism and thrombosis of unspecified artery: Secondary | ICD-10-CM | POA: Diagnosis not present

## 2014-06-19 DIAGNOSIS — I481 Persistent atrial fibrillation: Secondary | ICD-10-CM

## 2014-06-19 DIAGNOSIS — I4891 Unspecified atrial fibrillation: Secondary | ICD-10-CM | POA: Diagnosis not present

## 2014-06-19 DIAGNOSIS — I4819 Other persistent atrial fibrillation: Secondary | ICD-10-CM

## 2014-06-19 DIAGNOSIS — Z5181 Encounter for therapeutic drug level monitoring: Secondary | ICD-10-CM | POA: Diagnosis not present

## 2014-06-19 LAB — POCT INR: INR: 2.1

## 2014-07-17 ENCOUNTER — Ambulatory Visit (INDEPENDENT_AMBULATORY_CARE_PROVIDER_SITE_OTHER): Payer: Medicare Other | Admitting: *Deleted

## 2014-07-17 DIAGNOSIS — I481 Persistent atrial fibrillation: Secondary | ICD-10-CM

## 2014-07-17 DIAGNOSIS — Z5181 Encounter for therapeutic drug level monitoring: Secondary | ICD-10-CM

## 2014-07-17 DIAGNOSIS — I4891 Unspecified atrial fibrillation: Secondary | ICD-10-CM

## 2014-07-17 DIAGNOSIS — I749 Embolism and thrombosis of unspecified artery: Secondary | ICD-10-CM | POA: Diagnosis not present

## 2014-07-17 DIAGNOSIS — I4819 Other persistent atrial fibrillation: Secondary | ICD-10-CM

## 2014-07-17 LAB — POCT INR: INR: 2.4

## 2014-07-23 ENCOUNTER — Encounter (HOSPITAL_COMMUNITY): Payer: Self-pay | Admitting: Emergency Medicine

## 2014-07-23 ENCOUNTER — Emergency Department (HOSPITAL_COMMUNITY)
Admission: EM | Admit: 2014-07-23 | Discharge: 2014-07-23 | Disposition: A | Discharge status: Home / Self Care | Payer: Medicare Other | Attending: Emergency Medicine | Admitting: Emergency Medicine

## 2014-07-23 DIAGNOSIS — F419 Anxiety disorder, unspecified: Secondary | ICD-10-CM | POA: Diagnosis not present

## 2014-07-23 DIAGNOSIS — N401 Enlarged prostate with lower urinary tract symptoms: Secondary | ICD-10-CM | POA: Insufficient documentation

## 2014-07-23 DIAGNOSIS — Z86018 Personal history of other benign neoplasm: Secondary | ICD-10-CM | POA: Insufficient documentation

## 2014-07-23 DIAGNOSIS — Z87891 Personal history of nicotine dependence: Secondary | ICD-10-CM | POA: Diagnosis not present

## 2014-07-23 DIAGNOSIS — M1990 Unspecified osteoarthritis, unspecified site: Secondary | ICD-10-CM | POA: Insufficient documentation

## 2014-07-23 DIAGNOSIS — I10 Essential (primary) hypertension: Secondary | ICD-10-CM | POA: Diagnosis not present

## 2014-07-23 DIAGNOSIS — K4090 Unilateral inguinal hernia, without obstruction or gangrene, not specified as recurrent: Secondary | ICD-10-CM | POA: Insufficient documentation

## 2014-07-23 DIAGNOSIS — E669 Obesity, unspecified: Secondary | ICD-10-CM | POA: Diagnosis not present

## 2014-07-23 DIAGNOSIS — I481 Persistent atrial fibrillation: Secondary | ICD-10-CM | POA: Diagnosis not present

## 2014-07-23 DIAGNOSIS — Z9981 Dependence on supplemental oxygen: Secondary | ICD-10-CM | POA: Insufficient documentation

## 2014-07-23 DIAGNOSIS — Z79899 Other long term (current) drug therapy: Secondary | ICD-10-CM | POA: Insufficient documentation

## 2014-07-23 DIAGNOSIS — Z86711 Personal history of pulmonary embolism: Secondary | ICD-10-CM | POA: Diagnosis not present

## 2014-07-23 DIAGNOSIS — G8929 Other chronic pain: Secondary | ICD-10-CM | POA: Insufficient documentation

## 2014-07-23 DIAGNOSIS — J45909 Unspecified asthma, uncomplicated: Secondary | ICD-10-CM | POA: Diagnosis not present

## 2014-07-23 DIAGNOSIS — G4733 Obstructive sleep apnea (adult) (pediatric): Secondary | ICD-10-CM | POA: Insufficient documentation

## 2014-07-23 DIAGNOSIS — F339 Major depressive disorder, recurrent, unspecified: Secondary | ICD-10-CM | POA: Diagnosis not present

## 2014-07-23 DIAGNOSIS — R109 Unspecified abdominal pain: Secondary | ICD-10-CM | POA: Diagnosis present

## 2014-07-23 DIAGNOSIS — G2581 Restless legs syndrome: Secondary | ICD-10-CM | POA: Diagnosis not present

## 2014-07-23 DIAGNOSIS — Z7901 Long term (current) use of anticoagulants: Secondary | ICD-10-CM | POA: Diagnosis not present

## 2014-07-23 MED ORDER — HYDROCODONE-ACETAMINOPHEN 5-325 MG PO TABS
2.0000 | ORAL_TABLET | Freq: Once | ORAL | Status: AC
  Administered 2014-07-23: 2 via ORAL
  Filled 2014-07-23: qty 2

## 2014-07-23 MED ORDER — TRAMADOL HCL 50 MG PO TABS: 50.0000 mg | ORAL_TABLET | Freq: Four times a day (QID) | ORAL | Status: DC | PRN

## 2014-07-23 NOTE — ED Notes (Signed)
Per EMS- Pt has hx of hernia and repair several times. Pt reports th\at he was walking around and felt a pop and immediate intense pain. EMS gave 128mcg fentanyl PTA. Pt reports 3/10 pain.

## 2014-07-23 NOTE — Discharge Instructions (Signed)
Seems like the hernia has relaxed and the pain essentially resolved. The pain is most likely from a hernia - and we advise you to see the surgeon as soon as possible.  Return to the ER if the pain gets severe, you have sweats, nausea. Also, you are coumadin, and other psych/anxiety meds - tramadol can have slight interference with them - so avoid tramadol unless the pain is severe.   Inguinal Hernia, Adult Muscles help keep everything in the body in its proper place. But if a weak spot in the muscles develops, something can poke through. That is called a hernia. When this happens in the lower part of the belly (abdomen), it is called an inguinal hernia. (It takes its name from a part of the body in this region called the inguinal canal.) A weak spot in the wall of muscles lets some fat or part of the small intestine bulge through. An inguinal hernia can develop at any age. Men get them more often than women. CAUSES  In adults, an inguinal hernia develops over time.  It can be triggered by:  Suddenly straining the muscles of the lower abdomen.  Lifting heavy objects.  Straining to have a bowel movement. Difficult bowel movements (constipation) can lead to this.  Constant coughing. This may be caused by smoking or lung disease.  Being overweight.  Being pregnant.  Working at a job that requires long periods of standing or heavy lifting.  Having had an inguinal hernia before. One type can be an emergency situation. It is called a strangulated inguinal hernia. It develops if part of the small intestine slips through the weak spot and cannot get back into the abdomen. The blood supply can be cut off. If that happens, part of the intestine may die. This situation requires emergency surgery. SYMPTOMS  Often, a small inguinal hernia has no symptoms. It is found when a healthcare provider does a physical exam. Larger hernias usually have symptoms.   In adults, symptoms may include:  A lump  in the groin. This is easier to see when the person is standing. It might disappear when lying down.  In men, a lump in the scrotum.  Pain or burning in the groin. This occurs especially when lifting, straining or coughing.  A dull ache or feeling of pressure in the groin.  Signs of a strangulated hernia can include:  A bulge in the groin that becomes very painful and tender to the touch.  A bulge that turns red or purple.  Fever, nausea and vomiting.  Inability to have a bowel movement or to pass gas. DIAGNOSIS  To decide if you have an inguinal hernia, a healthcare provider will probably do a physical examination.  This will include asking questions about any symptoms you have noticed.  The healthcare provider might feel the groin area and ask you to cough. If an inguinal hernia is felt, the healthcare provider may try to slide it back into the abdomen.  Usually no other tests are needed. TREATMENT  Treatments can vary. The size of the hernia makes a difference. Options include:  Watchful waiting. This is often suggested if the hernia is small and you have had no symptoms.  No medical procedure will be done unless symptoms develop.  You will need to watch closely for symptoms. If any occur, contact your healthcare provider right away.  Surgery. This is used if the hernia is larger or you have symptoms.  Open surgery. This is usually an  outpatient procedure (you will not stay overnight in a hospital). An cut (incision) is made through the skin in the groin. The hernia is put back inside the abdomen. The weak area in the muscles is then repaired by herniorrhaphy or hernioplasty. Herniorrhaphy: in this type of surgery, the weak muscles are sewn back together. Hernioplasty: a patch or mesh is used to close the weak area in the abdominal wall.  Laparoscopy. In this procedure, a surgeon makes small incisions. A thin tube with a tiny video camera (called a laparoscope) is put into  the abdomen. The surgeon repairs the hernia with mesh by looking with the video camera and using two long instruments. HOME CARE INSTRUCTIONS   After surgery to repair an inguinal hernia:  You will need to take pain medicine prescribed by your healthcare provider. Follow all directions carefully.  You will need to take care of the wound from the incision.  Your activity will be restricted for awhile. This will probably include no heavy lifting for several weeks. You also should not do anything too active for a few weeks. When you can return to work will depend on the type of job that you have.  During "watchful waiting" periods, you should:  Maintain a healthy weight.  Eat a diet high in fiber (fruits, vegetables and whole grains).  Drink plenty of fluids to avoid constipation. This means drinking enough water and other liquids to keep your urine clear or pale yellow.  Do not lift heavy objects.  Do not stand for long periods of time.  Quit smoking. This should keep you from developing a frequent cough. SEEK MEDICAL CARE IF:   A bulge develops in your groin area.  You feel pain, a burning sensation or pressure in the groin. This might be worse if you are lifting or straining.  You develop a fever of more than 100.5 F (38.1 C). SEEK IMMEDIATE MEDICAL CARE IF:   Pain in the groin increases suddenly.  A bulge in the groin gets bigger suddenly and does not go down.  For men, there is sudden pain in the scrotum. Or, the size of the scrotum increases.  A bulge in the groin area becomes red or purple and is painful to touch.  You have nausea or vomiting that does not go away.  You feel your heart beating much faster than normal.  You cannot have a bowel movement or pass gas.  You develop a fever of more than 102.0 F (38.9 C). Document Released: 05/10/2008 Document Revised: 03/16/2011 Document Reviewed: 05/10/2008 [facility] Patient Information 2015 [place], [place]. This  information is not intended to replace advice given to you by your health care provider. Make sure you discuss any questions you have with your health care provider.

## 2014-07-23 NOTE — ED Provider Notes (Signed)
CSN: [id number]     Arrival date & time 07/23/14  1733 History   First MD Initiated Contact with Patient 07/23/14 1744     Chief Complaint  Patient presents with  . Groin Pain     (Consider location/radiation/quality/duration/timing/severity/associated sxs/prior Treatment) HPI Comments: Pt is a 64 y/o male with several medical comorbidities and R sided inguinal hernia, s/p repair and revision who comes in with groin pain. Pt started having sudden onset R groin pain after he was done walking his dog. He has hx of hernia and recurrent pain from it, so he does what he usually does in those cases and laid down and placed some ice. Few minutes later, his pain was getting worse, and when he examined his groin, there was significant swelling, and the pain was worse than he has ever suffered, thus he called EMS.  En route, pt was given fentanyl, and by the time he made it to the ER his pain had improved drastically and the swelling resolved too. No UTI like sx, no trauma. Pt has normal BM.    ROS 10 Systems reviewed and are negative for acute change except as noted in the HPI.     Patient is a 64 y.o. male presenting with groin pain. The history is provided by the patient.  Groin Pain Associated symptoms include abdominal pain.    Past Medical History  Diagnosis Date  . Arthritis   . Sciatica   . Anxiety   . Hypertension   . Osteoporosis   . Cataract   . Asthma     Patient and wife both dinies patient has or had asthma  . Enlarged prostate   . Left atrial enlargement   . Persistent atrial fibrillation   . Obesity   . Bilateral pulmonary embolism 3/14    admitted to [facility],  treated with Xarelto  . Chronic pain   . Thrombus of left atrial appendage 03/09/2013  . Mitral regurgitation 04/24/2013    Mild by TEE  . Fatigue   . Dyspnea on exertion   . Polydipsia   . Anxiety disorder   . Benign neoplasm of colon   . Benign prostatic hyperplasia with urinary obstruction   . Chest  pain   . Encounter for long-term (current) use of other medications   . Hyperlipemia   . Hypopotassemia   . Insomnia   . Major depressive disorder, recurrent episode   . Osteoarthritis   . Periodic limb movement disorder   . SOB (shortness of breath)   . Thoracic or lumbosacral neuritis or radiculitis, unspecified   . Ventral hernia, unspecified, without mention of obstruction or gangrene     right abdominal wall  . Restless leg syndrome     takes gabapentin  . Hepatitis C virus     patient denies  . Dysrhythmia     afib   . OSA (obstructive sleep apnea)     noncompliant with CPAP.  07/27/13- awaiting a CPAP,  . Obstructive sleep apnea    Past Surgical History  Procedure Laterality Date  . Knee arthroscopy      left  . Colonoscopy w/ polypectomy    . Rotator cuff repair      right  . Tonsillectomy    . Cataract extraction    . Hernia repair  07/06/2011  . Replacement total knee Left   . Tee without cardioversion N/A 03/21/2012    Procedure: TRANSESOPHAGEAL ECHOCARDIOGRAM (TEE);  Surgeon: Birdie Riddle, MD;  Location:  [facility];  Service: Cardiovascular;  Laterality: N/A;  . Cardioversion N/A 03/21/2012    Procedure: CARDIOVERSION;  Surgeon: Birdie Riddle, MD;  Location: [place];  Service: Cardiovascular;  Laterality: N/A;  . Tee without cardioversion N/A 04/24/2013    Procedure: TRANSESOPHAGEAL ECHOCARDIOGRAM (TEE);  Surgeon: Dorothy Spark, MD;  Location: [place];  Service: Cardiovascular;  Laterality: N/A;  . Cardioversion N/A 04/24/2013    Procedure: CARDIOVERSION;  Surgeon: Dorothy Spark, MD;  Location: [place];  Service: Cardiovascular;  Laterality: N/A;  . Eye surgery Right     cataract  . Femur fracture surgery    . Inguinal hernia repair Right 07/28/2013    Procedure: RIGHT INGUINAL HERNIA REPAIR;  Surgeon: Imogene Burn. Georgette Dover, MD;  Location: [place];  Service: General;  Laterality: Right;  . Insertion of mesh Right 07/28/2013    Procedure:  INSERTION OF MESH;  Surgeon: Imogene Burn. Georgette Dover, MD;  Location: [place];  Service: General;  Laterality: Right;  . Rotator cuff repair Left 03/08/2014    DR SUPPLE  . Shoulder arthroscopy with rotator cuff repair and subacromial decompression Left 03/08/2014    Procedure: LEFT SHOULDER ARTHROSCOPY WITH ROTATOR CUFF REPAIR/SUBACROMIAL DECOMPRESSION/DISTAL CLAVICLE RESECTION;  Surgeon: Marin Shutter, MD;  Location: [place];  Service: Orthopedics;  Laterality: Left;   Family History  Problem Relation Age of Onset  . Cancer Father     bone  . Cancer Paternal Grandmother     ovarian  . CVA Other     Fam Hx of multiple myeloma  . Diabetes Other     Fam Hx of DM   History  Substance Use Topics  . Smoking status: Former Research scientist (life sciences)  . Smokeless tobacco: Never Used  . Alcohol Use: 0.0 oz/week    0 Standard drinks or equivalent per week     Comment: 2 drinks per night    Review of Systems  Gastrointestinal: Positive for abdominal pain.  All other systems reviewed and are negative.     Allergies  Ace inhibitors  Home Medications   Prior to Admission medications   Medication Sig Start Date End Date Taking? Authorizing Provider  amiodarone (PACERONE) 200 MG tablet Take 0.5 tablets (100 mg total) by mouth daily. 05/24/14   Thompson Grayer, MD  diazepam (VALIUM) 5 MG tablet Take 1 tablet (5 mg total) by mouth at bedtime. 06/12/14   Britt Bottom, MD  escitalopram (LEXAPRO) 20 MG tablet Take 20 mg by mouth daily.    Historical Provider, MD  gabapentin (NEURONTIN) 800 MG tablet Take 1 tablet (800 mg total) by mouth See admin instructions. Takes 1 tablet by mouth daily with supper, and take 1 tablet at bedtime as needed 06/12/14   Britt Bottom, MD  lidocaine (LIDODERM) 5 % Place 1 patch onto the skin daily as needed (knee pain).  02/08/14   Historical Provider, MD  losartan (COZAAR) 25 MG tablet Take 1 tablet (25 mg total) by mouth daily. 05/24/14   Thompson Grayer, MD  naproxen sodium (ANAPROX) 220 MG tablet  Take 440 mg by mouth daily as needed (pain). [place]    Historical Provider, MD  tamsulosin (FLOMAX) 0.4 MG CAPS capsule Take 0.4 mg by mouth 2 (two) times daily.     Historical Provider, MD  traMADol (ULTRAM) 50 MG tablet Take 1 tablet (50 mg total) by mouth every 6 (six) hours as needed. 07/23/14   Varney Biles, MD  warfarin (COUMADIN) 5 MG tablet TAKE AS DIRECTED BY COUMADIN  CLINIC Patient taking differently: TAKE 1/2 TABLET (2.5 MG)ON MON., AND FRI., WITH SUPPER, TAKE 1 TABLET (5 MG) ON SUN., TUES., WED., BSWHQ., SAT., WITH SUPPER OR  AS DIRECTED BY COUMADIN CLINIC 04/09/14   Thompson Grayer, MD   BP 170/84 mmHg  Pulse 67  Temp(Src) 98.6 F (37 C) (Oral)  Resp 21  SpO2 94% Physical Exam  Constitutional: He is oriented to person, place, and time. He appears well-developed.  HENT:  Head: Normocephalic and atraumatic.  Eyes: Conjunctivae and EOM are normal. Pupils are equal, round, and reactive to light.  Neck: Normal range of motion. Neck supple.  Cardiovascular: Normal rate and regular rhythm.   Pulmonary/Chest: Effort normal and breath sounds normal.  Abdominal: Soft. Bowel sounds are normal. He exhibits mass. He exhibits no distension. There is tenderness. There is no rebound and no guarding.  Right inguinal region - there is mild edema, no skin discoloration. Pt has a small area of defect that i can appreciate on my palpation, and with cough, the area gets larger and i can hear the bowel sounds. There is no evidence of incarceration. Pt stood up and ambulated - and no large bulging appreciated.   Genitourinary: Penis normal.  Neurological: He is alert and oriented to person, place, and time.  Skin: Skin is warm.  Nursing note and vitals reviewed.   ED Course  Procedures (including critical care time) Labs Review Labs Reviewed - No data to display  Imaging Review No results found.   EKG Interpretation None      _0 :10 pm: continues to be comfortable, with no incarcerated  hernia. Pt is comfortable going home. Strict return precautions for severe pain discussed, and f/u advised. Pt agrees with the plan.  MDM   Final diagnoses:  Inguinal hernia, right    Pt has a R sided inguinal region pain. Hx of hernia on the same side, and he had severe pain prior to ER arrival and bulging. It seems like the hernia spontaneously reduced itself. He is a lot comfortable now and the exam is consistent with hernia only. GI exam is benign and pt had no other sx leading upto the event after the resolution of the acute pain.  Will monitor in the ER a bit longer, and if he continues to do well, we can discharge. No labs or imaging indicated currently.    Varney Biles, MD 07/23/14 1924

## 2014-07-23 NOTE — ED Notes (Signed)
DC instructions given and all belongings given to the pt at dc time. Pt assisted to the waiting room at dc time.

## 2014-07-27 ENCOUNTER — Ambulatory Visit: Payer: Self-pay | Admitting: Surgery

## 2014-07-27 NOTE — H&P (Signed)
History of Present Illness Brandon [name]. Brandon Idrovo MD; 07/27/2014 11:26 AM) Patient words: reck.  The patient is a 64 year old male who presents with an inguinal hernia. This patient is status post open repair of a recurrent right inguinal hernia on 07/28/13. This was repaired with progrip mesh. He had laparoscopic repair of bilateral inguinal hernias in July 2013 as well as a small umbilical hernia. Over the last several months the patient has noticed some increasing pain in his right groin after he has been standing for any length of time. Earlier this week he has some swelling that became fairly large and exquisitely painful. He was brought to the emergency department where the emergency department physician was able to reduce this. During that time he did have some nausea and vomiting but he has felt better since then. He presents now to be evaluated for possible repair of a recurrent hernia. Other Problems Marjean Donna, CMA; 07/27/2014 9:27 AM) Anxiety Disorder Arthritis Atrial Fibrillation Back Pain Enlarged Prostate High blood pressure Inguinal Hernia Sleep Apnea  Past Surgical History Marjean Donna, CMA; 07/27/2014 9:27 AM) Colon Polyp Removal - Colonoscopy Knee Surgery Left. Laparoscopic Inguinal Hernia Surgery Right. Shoulder Surgery Bilateral. Tonsillectomy  Diagnostic Studies History Marjean Donna, CMA; 07/27/2014 9:27 AM) Colonoscopy 1-5 years ago  Allergies Davy Pique Bynum, CMA; 07/27/2014 9:29 AM) No Known Drug Allergies 07/27/2014  Medication History (Sonya Bynum, CMA; 07/27/2014 9:29 AM) TraMADol HCl (50MG  Tablet, Oral) Active. Gabapentin (800MG  Tablet, Oral) Active. Escitalopram Oxalate (20MG  Tablet, Oral) Active. Losartan Potassium (25MG  Tablet, Oral) Active. Tamsulosin HCl (0.4MG  Capsule, Oral) Active. Warfarin Sodium (5MG  Tablet, Oral) Active. Diazepam (5MG  Tablet, Oral) Active. Medications Reconciled  Social History Marjean Donna, CMA; 07/27/2014  9:27 AM) Alcohol use Moderate alcohol use. Caffeine use Coffee, Tea. No drug use Tobacco use Former smoker.  Family History Marjean Donna, [place]; 07/27/2014 9:27 AM) Family history unknown First Degree Relatives     Review of Systems Davy Pique Bynum CMA; 07/27/2014 9:27 AM) General Not Present- Appetite Loss, Chills, Fatigue, Fever, Night Sweats, Weight Gain and Weight Loss. Skin Not Present- Change in Wart/Mole, Dryness, Hives, Jaundice, New Lesions, Non-Healing Wounds, Rash and Ulcer. HEENT Not Present- Earache, Hearing Loss, Hoarseness, Nose Bleed, Oral Ulcers, Ringing in the Ears, Seasonal Allergies, Sinus Pain, Sore Throat, Visual Disturbances, Wears glasses/contact lenses and Yellow Eyes. Respiratory Not Present- Bloody sputum, Chronic Cough, Difficulty Breathing, Snoring and Wheezing. Breast Not Present- Breast Mass, Breast Pain, Nipple Discharge and Skin Changes. Cardiovascular Not Present- Chest Pain, Difficulty Breathing Lying Down, Leg Cramps, Palpitations, Rapid Heart Rate, Shortness of Breath and Swelling of Extremities. Gastrointestinal Present- Abdominal Pain, Bloating, Nausea and Vomiting. Not Present- Bloody Stool, Change in Bowel Habits, Chronic diarrhea, Constipation, Difficulty Swallowing, Excessive gas, Gets full quickly at meals, Hemorrhoids, Indigestion and Rectal Pain. Male Genitourinary Not Present- Blood in Urine, Change in Urinary Stream, Frequency, Impotence, Nocturia, Painful Urination, Urgency and Urine Leakage. Musculoskeletal Present- Joint Pain and Joint Stiffness. Not Present- Back Pain, Muscle Pain, Muscle Weakness and Swelling of Extremities. Neurological Not Present- Decreased Memory, Fainting, Headaches, Numbness, Seizures, Tingling, Tremor, Trouble walking and Weakness. Psychiatric Present- Anxiety and Depression. Not Present- Bipolar, Change in Sleep Pattern, Fearful and Frequent crying. Endocrine Not Present- Cold Intolerance, Excessive Hunger, Hair  Changes, Heat Intolerance, Hot flashes and New Diabetes. Hematology Not Present- Easy Bruising, Excessive bleeding, Gland problems, HIV and Persistent Infections.  Vitals (Sonya Bynum CMA; 07/27/2014 9:29 AM) 07/27/2014 9:28 AM Weight: 249.6 lb Height: 75in Body Surface Area: 2.45 m Body Mass  Index: 31.2 kg/m Temp.: 38F(Temporal)  Pulse: 79 (Regular)  BP: 128/80 (Sitting, Left Arm, Standard)     Physical Exam Rodman Key K. Jinan Biggins MD; 07/27/2014 11:27 AM)  The physical exam findings are as follows: Note:WDWN in NAD HEENT: EOMI, sclera anicteric Neck: No masses, no thyromegaly Lungs: CTA bilaterally; normal respiratory effort CV: Regular rate and rhythm; no murmurs Abd: +bowel sounds, soft, non-tender, no masses GU: bilateral descended testes; no sign of left inguinal hernia; reducible right inguinal hernia - defect seems to be medial Ext: Well-perfused; no edema Skin: Warm, dry; no sign of jaundice    Assessment & Plan Rodman Key K. Renley Gutman MD; 07/27/2014 11:27 AM)  RECURRENT RIGHT INGUINAL HERNIA (550.91  K40.91)  Current Plans Schedule for Surgery - Open repair of recurrent right inguinal hernia with mesh. The surgical procedure has been discussed with the patient. Potential risks, benefits, alternative treatments, and expected outcomes have been explained. All of the patient's questions at this time have been answered. The likelihood of reaching the patient's treatment goal is good. The patient understand the proposed surgical procedure and wishes to proceed.  We will obtain cardiac clearance from Dr. Rayann Heman. The patient will likely need a Lovenox bridge again. Note:The patient will need cardiac clearance by Dr. Rayann Heman. He had a lovenox bridge with his last hernia repair as well as with his shoulder surgery.  Brandon [name]. Georgette Dover, MD, [facility] Surgery  General/ Trauma Surgery  07/27/2014 11:31 AM

## 2014-07-30 ENCOUNTER — Telehealth: Payer: Self-pay | Admitting: Internal Medicine

## 2014-07-30 NOTE — Telephone Encounter (Signed)
Dr Rayann Heman has and will fax today

## 2014-07-30 NOTE — Telephone Encounter (Signed)
New message    Fax request sent over 7/22.2016   Request for surgical clearance:  1. What type of surgery is being performed? Hernia repairs   2. When is this surgery scheduled? Pending    3. Are there any medications that need to be held prior to surgery and how long? coumadin - please advise on bridge for Lovenox.    4. Name of physician performing surgery? Dr. Georgette Dover   5. What is your office phone and fax number? [phone] or  [phone]

## 2014-08-01 ENCOUNTER — Telehealth: Payer: Self-pay | Admitting: Internal Medicine

## 2014-08-01 NOTE — Telephone Encounter (Signed)
Form filled out and faxed for clearance   Lovenox bridge needed

## 2014-08-01 NOTE — Telephone Encounter (Signed)
Follow Up       Calling to find out about surgical clearance, still haven't heard back from our office.

## 2014-08-14 ENCOUNTER — Ambulatory Visit (INDEPENDENT_AMBULATORY_CARE_PROVIDER_SITE_OTHER): Payer: Medicare Other

## 2014-08-14 DIAGNOSIS — I4819 Other persistent atrial fibrillation: Secondary | ICD-10-CM

## 2014-08-14 DIAGNOSIS — Z5181 Encounter for therapeutic drug level monitoring: Secondary | ICD-10-CM

## 2014-08-14 DIAGNOSIS — I749 Embolism and thrombosis of unspecified artery: Secondary | ICD-10-CM

## 2014-08-14 DIAGNOSIS — I481 Persistent atrial fibrillation: Secondary | ICD-10-CM

## 2014-08-14 DIAGNOSIS — I4891 Unspecified atrial fibrillation: Secondary | ICD-10-CM

## 2014-08-14 LAB — POCT INR: INR: 1.8

## 2014-08-14 MED ORDER — ENOXAPARIN SODIUM 120 MG/0.8ML ~~LOC~~ SOLN: 120.0000 mg | Freq: Two times a day (BID) | SUBCUTANEOUS | Status: DC

## 2014-08-14 NOTE — Patient Instructions (Addendum)
08/23/14 Take your last dosage of Coumadin.   08/24/14 No Coumadin, No Lovenox. Per surgeon inject Lovenox on Left side given hernia R side. 08/25/14 Start taking Lovenox 120mg  in the am, and Lovenox 120mg  in the pm. 08/26/14 Take Lovenox 120mg  in the am, and Lovenox 120mg  in the pm. 08/27/14 Take Lovenox 120mg  in the am, and Lovenox 120mg  in the pm. 08/28/14 Take your last dosage of Lovenox 120mg  in the am, No Lovenox in the pm prior to surgery on 08/29/14.  Resume Lovenox 120mg  twice daily (every 12 hrs) post surgery once MD states ok to do so.  Resume Coumadin after surgery once MD states OK to do so, take an extra 1/2 tablet x 2 doses, then resume your previous dosage regimen 1 tablet everyday except 1/2 tablet on Mondays and Fridays. Continue on both Coumadin and Lovenox until INR check is 2.0 or greater.

## 2014-08-20 NOTE — Pre-Procedure Instructions (Signed)
    Brandon Low Waterhouse Jr.  08/20/2014      [place], [place] [address] Phone: [phone] Fax: [phone]    Your procedure is scheduled on 08/29/14.  Report to [facility] Admitting at 930 A.M.  Call this number if you have problems the morning of surgery:  [phone]   Remember:  Do not eat food or drink liquids after midnight.  Take these medicines the morning of surgery with A SIP OF WATER --pacerone,valium,lexapro,neurontin,flomax   Do not wear jewelry, make-up or nail polish.  Do not wear lotions, powders, or perfumes.  You may wear deodorant.  Do not shave 48 hours prior to surgery.  Men may shave face and neck.  Do not bring valuables to the hospital.  [facility] is not responsible for any belongings or valuables.  Contacts, dentures or bridgework may not be worn into surgery.  Leave your suitcase in the car.  After surgery it may be brought to your room.  For patients admitted to the hospital, discharge time will be determined by your treatment team.  Patients discharged the day of surgery will not be allowed to drive home.   Name and phone number of your driver:    Special instructions:    Please read over the following fact sheets that you were given. Pain Booklet, Coughing and Deep Breathing and Surgical Site Infection Prevention

## 2014-08-21 ENCOUNTER — Encounter (HOSPITAL_COMMUNITY)
Admission: RE | Admit: 2014-08-21 | Discharge: 2014-08-21 | Disposition: A | Discharge status: Home / Self Care | Payer: Medicare Other | Source: Ambulatory Visit | Attending: Surgery | Admitting: Surgery

## 2014-08-21 ENCOUNTER — Encounter (HOSPITAL_COMMUNITY): Payer: Self-pay

## 2014-08-21 DIAGNOSIS — Z01812 Encounter for preprocedural laboratory examination: Secondary | ICD-10-CM | POA: Diagnosis not present

## 2014-08-21 DIAGNOSIS — Z7901 Long term (current) use of anticoagulants: Secondary | ICD-10-CM | POA: Diagnosis not present

## 2014-08-21 DIAGNOSIS — Z79899 Other long term (current) drug therapy: Secondary | ICD-10-CM | POA: Diagnosis not present

## 2014-08-21 DIAGNOSIS — K4091 Unilateral inguinal hernia, without obstruction or gangrene, recurrent: Secondary | ICD-10-CM | POA: Diagnosis not present

## 2014-08-21 DIAGNOSIS — I10 Essential (primary) hypertension: Secondary | ICD-10-CM | POA: Diagnosis not present

## 2014-08-21 DIAGNOSIS — G4730 Sleep apnea, unspecified: Secondary | ICD-10-CM | POA: Diagnosis not present

## 2014-08-21 DIAGNOSIS — Z86711 Personal history of pulmonary embolism: Secondary | ICD-10-CM | POA: Diagnosis not present

## 2014-08-21 DIAGNOSIS — G4733 Obstructive sleep apnea (adult) (pediatric): Secondary | ICD-10-CM | POA: Diagnosis not present

## 2014-08-21 DIAGNOSIS — I4891 Unspecified atrial fibrillation: Secondary | ICD-10-CM | POA: Diagnosis not present

## 2014-08-21 DIAGNOSIS — F329 Major depressive disorder, single episode, unspecified: Secondary | ICD-10-CM | POA: Diagnosis not present

## 2014-08-21 DIAGNOSIS — Z01818 Encounter for other preprocedural examination: Secondary | ICD-10-CM | POA: Diagnosis not present

## 2014-08-21 DIAGNOSIS — J45909 Unspecified asthma, uncomplicated: Secondary | ICD-10-CM | POA: Diagnosis not present

## 2014-08-21 DIAGNOSIS — F419 Anxiety disorder, unspecified: Secondary | ICD-10-CM | POA: Diagnosis not present

## 2014-08-21 DIAGNOSIS — E785 Hyperlipidemia, unspecified: Secondary | ICD-10-CM | POA: Diagnosis not present

## 2014-08-21 DIAGNOSIS — M1990 Unspecified osteoarthritis, unspecified site: Secondary | ICD-10-CM | POA: Diagnosis not present

## 2014-08-21 DIAGNOSIS — Z5309 Procedure and treatment not carried out because of other contraindication: Secondary | ICD-10-CM | POA: Diagnosis not present

## 2014-08-21 DIAGNOSIS — I481 Persistent atrial fibrillation: Secondary | ICD-10-CM | POA: Diagnosis not present

## 2014-08-21 DIAGNOSIS — Z87891 Personal history of nicotine dependence: Secondary | ICD-10-CM | POA: Diagnosis not present

## 2014-08-21 LAB — CBC
HCT: 40.8 % (ref 39.0–52.0)
Hemoglobin: 14.1 g/dL (ref 13.0–17.0)
MCH: 31.2 pg (ref 26.0–34.0)
MCHC: 34.6 g/dL (ref 30.0–36.0)
MCV: 90.3 fL (ref 78.0–100.0)
Platelets: 168 10*3/uL (ref 150–400)
RBC: 4.52 MIL/uL (ref 4.22–5.81)
RDW: 13.7 % (ref 11.5–15.5)
WBC: 5.2 10*3/uL (ref 4.0–10.5)

## 2014-08-21 LAB — BASIC METABOLIC PANEL
Anion gap: 10 (ref 5–15)
BUN: 16 mg/dL (ref 6–20)
CO2: 29 mmol/L (ref 22–32)
Calcium: 9.2 mg/dL (ref 8.9–10.3)
Chloride: 100 mmol/L — ABNORMAL LOW (ref 101–111)
Creatinine, Ser: 0.89 mg/dL (ref 0.61–1.24)
GFR calc Af Amer: >60 mL/min (ref >60)
GFR calc non Af Amer: >60 mL/min (ref >60)
Glucose, Bld: 93 mg/dL (ref 65–99)
Potassium: 3.2 mmol/L — ABNORMAL LOW (ref 3.5–5.1)
Sodium: 139 mmol/L (ref 135–145)

## 2014-08-21 LAB — APTT: aPTT: 35 seconds (ref 24–37)

## 2014-08-22 ENCOUNTER — Ambulatory Visit (HOSPITAL_COMMUNITY): Payer: Medicare Other | Admitting: Anesthesiology

## 2014-08-22 ENCOUNTER — Ambulatory Visit (HOSPITAL_COMMUNITY): Payer: Medicare Other | Admitting: Emergency Medicine

## 2014-08-22 NOTE — Progress Notes (Signed)
Anesthesia Chart Review:  Pt is 64 year old male scheduled for open repair of R recurrent inguinal hernia on 08/29/2014 with Dr. Georgette Dover.   Cardiologist is Dr. Rayann Heman. PCP is Dr. Nilda Simmer  PMH includes: persistent atrial fibrillation, mitral regurgitation, HTN, OSA (does not use CPAP), bilateral PE (2014), asthma, hyperlipidemia, thrombus of L atrial appendage (on lifelong anticoagulation). Former smoker. BMI 31. S/p L shoulder arthroscopy 03/08/14. S/p R inguinal hernia repair 07/28/13.   Medications include: amiodarone, lovenox, losartan, coumadin. Pt is to stop coumadin 8/18 and start lovenox 8/20.   Preoperative labs reviewed.    Chest x-ray 05/26/2014 reviewed. No acute cardiopulmonary process.   EKG 05/26/2014: Sinus rhythm with frequent PVCs. Prolonged QT. Artifact.   Nuclear stress test 07/18/2013:  -Intermediate risk stress nuclear study with fixed inferior defect, more suggestive of artifact than scar. LV Ejection Fraction: 49%. LV Wall Motion: Mild inferior hypokinesis and incoordinate septal motion. -Dr. Rayann Heman comments in the notes section of test results "Inferior defect appears to be due to gut attenuation artifact. I believe that this is a low risk study. Proceed with surgery if medically indicated. No further CV testing at this time."    TEE 04/24/2013:  - Left ventricle: No evidence of thrombus. - Mitral valve: Mild regurgitation. - Left atrium: The atrium was dilated. No evidence of thrombus in the atrial cavity or appendage. No evidence of thrombus in the atrial cavity or appendage. The appendage was well visualized and moderately dilated. Emptying velocity was mildly reduced. - Recommendations: No evidence of thrombus in left atrium or left atrial appendage. The TEE was followed by a successful cardioversion. -Impressions: No cardiac source of emboli was indentified. Successful cardioversion.  Pt has cardiac clearance from Dr. Rayann Heman for surgery (found in media tab dated  07/31/2014).   If no changes, I anticipate pt can proceed with surgery as scheduled.   Willeen Cass, FNP-BC [facility]/Anesthesiology Phone: [phone] 08/22/2014 1:51 PM

## 2014-08-24 ENCOUNTER — Ambulatory Visit (HOSPITAL_COMMUNITY)
Admission: RE | Admit: 2014-08-24 | Discharge: 2014-08-24 | Disposition: A | Discharge status: Home / Self Care | Payer: Medicare Other | Source: Ambulatory Visit | Attending: Nurse Practitioner | Admitting: Nurse Practitioner

## 2014-08-24 ENCOUNTER — Encounter (HOSPITAL_COMMUNITY): Payer: Self-pay | Admitting: Nurse Practitioner

## 2014-08-24 VITALS — BP 162/84 | HR 61 | Ht 75.0 in | Wt 251.2 lb

## 2014-08-24 DIAGNOSIS — I4819 Other persistent atrial fibrillation: Secondary | ICD-10-CM

## 2014-08-24 DIAGNOSIS — I10 Essential (primary) hypertension: Secondary | ICD-10-CM | POA: Insufficient documentation

## 2014-08-24 DIAGNOSIS — Z79899 Other long term (current) drug therapy: Secondary | ICD-10-CM | POA: Insufficient documentation

## 2014-08-24 DIAGNOSIS — I481 Persistent atrial fibrillation: Secondary | ICD-10-CM | POA: Insufficient documentation

## 2014-08-24 DIAGNOSIS — Z7901 Long term (current) use of anticoagulants: Secondary | ICD-10-CM | POA: Insufficient documentation

## 2014-08-24 DIAGNOSIS — E785 Hyperlipidemia, unspecified: Secondary | ICD-10-CM | POA: Insufficient documentation

## 2014-08-24 DIAGNOSIS — Z87891 Personal history of nicotine dependence: Secondary | ICD-10-CM | POA: Insufficient documentation

## 2014-08-24 DIAGNOSIS — Z01818 Encounter for other preprocedural examination: Secondary | ICD-10-CM | POA: Insufficient documentation

## 2014-08-24 DIAGNOSIS — Z01812 Encounter for preprocedural laboratory examination: Secondary | ICD-10-CM | POA: Insufficient documentation

## 2014-08-24 DIAGNOSIS — J45909 Unspecified asthma, uncomplicated: Secondary | ICD-10-CM | POA: Insufficient documentation

## 2014-08-24 DIAGNOSIS — G4733 Obstructive sleep apnea (adult) (pediatric): Secondary | ICD-10-CM | POA: Insufficient documentation

## 2014-08-24 DIAGNOSIS — Z86711 Personal history of pulmonary embolism: Secondary | ICD-10-CM | POA: Insufficient documentation

## 2014-08-24 DIAGNOSIS — K4091 Unilateral inguinal hernia, without obstruction or gangrene, recurrent: Secondary | ICD-10-CM | POA: Diagnosis not present

## 2014-08-24 LAB — TSH: TSH: 2.683 u[IU]/mL (ref 0.350–4.500)

## 2014-08-24 LAB — HEPATIC FUNCTION PANEL
ALT: 15 U/L — ABNORMAL LOW (ref 17–63)
AST: 24 U/L (ref 15–41)
Albumin: 4.2 g/dL (ref 3.5–5.0)
Alkaline Phosphatase: 54 U/L (ref 38–126)
Bilirubin, Direct: 0.1 mg/dL (ref 0.1–0.5)
Indirect Bilirubin: 0.7 mg/dL (ref 0.3–0.9)
Total Bilirubin: 0.8 mg/dL (ref 0.3–1.2)
Total Protein: 6.6 g/dL (ref 6.5–8.1)

## 2014-08-24 MED ORDER — LOSARTAN POTASSIUM 50 MG PO TABS: 50.0000 mg | ORAL_TABLET | Freq: Every day | ORAL | Status: DC

## 2014-08-24 NOTE — Patient Instructions (Signed)
Your physician has recommended you make the following change in your medication:  1)Increase losartan to 50mg  once a day  Parking code for November is 9000

## 2014-08-24 NOTE — Progress Notes (Signed)
Patient ID: [name], male   DOB: 06/05/1950, 64 y.o.   MRN: [id number]    Primary Care Physician: Nilda Simmer, MD Referring Physician: Dr. Raynaldo Opitz Linzy [name] is a 64 y.o. male with a h/o afib maintaining SR on amiodarone. He has noted any breakthrough afib. He is pending surgery and will need Lovenox. He has used this in the past and is comfortable re use. His BP is up today and he has noticed this with other medical visits.  Today, he denies symptoms of palpitations, chest pain, shortness of breath, orthopnea, PND, lower extremity edema, dizziness, presyncope, syncope, or neurologic sequela. The patient is tolerating medications without difficulties and is otherwise without complaint today.   Past Medical History  Diagnosis Date  . Arthritis   . Sciatica   . Anxiety   . Hypertension   . Osteoporosis   . Cataract   . Asthma     Patient and wife both dinies patient has or had asthma  . Enlarged prostate   . Left atrial enlargement   . Persistent atrial fibrillation   . Obesity   . Bilateral pulmonary embolism 3/14    admitted to [facility],  treated with Xarelto  . Chronic pain   . Thrombus of left atrial appendage 03/09/2013  . Mitral regurgitation 04/24/2013    Mild by TEE  . Fatigue   . Dyspnea on exertion   . Polydipsia   . Anxiety disorder   . Benign neoplasm of colon   . Benign prostatic hyperplasia with urinary obstruction   . Chest pain   . Encounter for long-term (current) use of other medications   . Hyperlipemia   . Hypopotassemia   . Insomnia   . Major depressive disorder, recurrent episode   . Osteoarthritis   . Periodic limb movement disorder   . SOB (shortness of breath)   . Thoracic or lumbosacral neuritis or radiculitis, unspecified   . Ventral hernia, unspecified, without mention of obstruction or gangrene     right abdominal wall  . Restless leg syndrome     takes gabapentin  . Dysrhythmia     afib   . OSA (obstructive sleep apnea)    noncompliant with CPAP.  07/27/13- awaiting a CPAP,  . Obstructive sleep apnea    Past Surgical History  Procedure Laterality Date  . Knee arthroscopy      left  . Colonoscopy w/ polypectomy    . Rotator cuff repair      right  . Tonsillectomy    . Cataract extraction    . Hernia repair  07/06/2011  . Replacement total knee Left   . Tee without cardioversion N/A 03/21/2012    Procedure: TRANSESOPHAGEAL ECHOCARDIOGRAM (TEE);  Surgeon: Birdie Riddle, MD;  Location: [place];  Service: Cardiovascular;  Laterality: N/A;  . Cardioversion N/A 03/21/2012    Procedure: CARDIOVERSION;  Surgeon: Birdie Riddle, MD;  Location: [facility] ENDOSCOPY;  Service: Cardiovascular;  Laterality: N/A;  . Tee without cardioversion N/A 04/24/2013    Procedure: TRANSESOPHAGEAL ECHOCARDIOGRAM (TEE);  Surgeon: Dorothy Spark, MD;  Location: [place];  Service: Cardiovascular;  Laterality: N/A;  . Cardioversion N/A 04/24/2013    Procedure: CARDIOVERSION;  Surgeon: Dorothy Spark, MD;  Location: [place];  Service: Cardiovascular;  Laterality: N/A;  . Eye surgery Right     cataract  . Femur fracture surgery    . Inguinal hernia repair Right 07/28/2013    Procedure: RIGHT INGUINAL HERNIA REPAIR;  Surgeon: Imogene Burn. Georgette Dover, MD;  Location: [place];  Service: General;  Laterality: Right;  . Insertion of mesh Right 07/28/2013    Procedure: INSERTION OF MESH;  Surgeon: Imogene Burn. Georgette Dover, MD;  Location: [place];  Service: General;  Laterality: Right;  . Rotator cuff repair Left 03/08/2014    DR SUPPLE  . Shoulder arthroscopy with rotator cuff repair and subacromial decompression Left 03/08/2014    Procedure: LEFT SHOULDER ARTHROSCOPY WITH ROTATOR CUFF REPAIR/SUBACROMIAL DECOMPRESSION/DISTAL CLAVICLE RESECTION;  Surgeon: Marin Shutter, MD;  Location: [place];  Service: Orthopedics;  Laterality: Left;    Current Outpatient Prescriptions  Medication Sig Dispense Refill  . amiodarone (PACERONE) 200 MG tablet Take 0.5  tablets (100 mg total) by mouth daily. 30 tablet 3  . diazepam (VALIUM) 5 MG tablet Take 1 tablet (5 mg total) by mouth at bedtime. 30 tablet 5  . enoxaparin (LOVENOX) 120 MG/0.8ML injection Inject 0.8 mLs (120 mg total) into the skin every 12 (twelve) hours. 20 Syringe 0  . escitalopram (LEXAPRO) 20 MG tablet Take 20 mg by mouth daily.    Marland Kitchen gabapentin (NEURONTIN) 800 MG tablet Take 1 tablet (800 mg total) by mouth See admin instructions. Takes 1 tablet by mouth daily with supper, and take 1 tablet at bedtime as needed 60 tablet 11  . lidocaine (LIDODERM) 5 % Place 1 patch onto the skin daily as needed (knee pain).     Marland Kitchen losartan (COZAAR) 50 MG tablet Take 1 tablet (50 mg total) by mouth daily. 30 tablet 3  . naproxen sodium (ANAPROX) 220 MG tablet Take 440 mg by mouth daily as needed (pain). ALEVE    . tamsulosin (FLOMAX) 0.4 MG CAPS capsule Take 0.4 mg by mouth 2 (two) times daily.     Marland Kitchen warfarin (COUMADIN) 5 MG tablet TAKE AS DIRECTED BY COUMADIN CLINIC (Patient taking differently: TAKE 1/2 TABLET (2.5 MG)ON MON., AND FRI., WITH SUPPER, TAKE 1 TABLET (5 MG) ON SUN., TUES., WED., THURS., SAT., WITH SUPPER OR  AS DIRECTED BY COUMADIN CLINIC) 30 tablet 3   No current facility-administered medications for this encounter.    Allergies  Allergen Reactions  . Ace Inhibitors Cough    Social History   Social History  . Marital Status: Married    Spouse Name: N/A  . Number of Children: N/A  . Years of Education: N/A   Occupational History  . Not on file.   Social History Main Topics  . Smoking status: Former Research scientist (life sciences)  . Smokeless tobacco: Never Used  . Alcohol Use: 0.0 oz/week    0 Standard drinks or equivalent per week     Comment: 2 drinks per night  . Drug Use: No     Comment: negative hx for IV drug abuse  . Sexual Activity: Not on file   Other Topics Concern  . Not on file   Social History Narrative   Lives with wife in [place]   Retired Dietitian     Family History  Problem Relation Age of Onset  . Cancer Father     bone  . Cancer Paternal Grandmother     ovarian  . CVA Other     Fam Hx of multiple myeloma  . Diabetes Other     Fam Hx of DM    ROS- All systems are reviewed and negative except as per the HPI above  Physical Exam: Filed Vitals:   08/24/14 0842  BP: 162/84  Pulse: 61  Height: _0  (1.905 m)  Weight: 251 lb 3.2 oz (113.944 kg)    GEN- The patient is well appearing, alert and oriented x 3 today.   Head- normocephalic, atraumatic Eyes-  Sclera clear, conjunctiva pink Ears- hearing intact Oropharynx- clear Neck- supple, no JVP Lymph- no cervical lymphadenopathy Lungs- Clear to ausculation bilaterally, normal work of breathing Heart- Regular rate and rhythm, no murmurs, rubs or gallops, PMI not laterally displaced GI- soft, NT, ND, + BS Extremities- no clubbing, cyanosis, or edema MS- no significant deformity or atrophy Skin- no rash or lesion Psych- euthymic mood, full affect Neuro- strength and sensation are intact  EKG- SR at 61 bpm, PR int 208 ms, Qrs int 110 ms, QTc 434 ms   Assessment and Plan: 1. afib Maintaining SR Continue amiodarone at 100 mg a day TSH, liver today  2. HTN Poorly controlled Increase losartan to 50 mg daily  3. Pending surgery Given clearance by Dr. Rayann Heman Has Lovenox scheduled  F/u in 3 months   Geroge Baseman. Erian Lariviere, [facility] [address] [place], [place] [address] [phone]

## 2014-08-27 ENCOUNTER — Telehealth: Payer: Self-pay

## 2014-08-27 NOTE — Progress Notes (Signed)
Patient called stating that the person bringing him was no longer able to bring him and take him home. He stated that he did not have anyone else. I advised patient that per anesthesia guidelines he would need someone with him for 24 hours after surgery and that he would be unable take a cab home. I suggested he contact Dr. Georgette Dover office about being admitted overnight and let them determine if he could drive or take a cab home the next day. Patient verbalized understanding Dr. Georgette Dover office number provided.

## 2014-08-27 NOTE — Telephone Encounter (Signed)
Pt called states he has B lateral bruising on thighs.  Pt states he does not really want to come into office, unless determined necessary.   Pt is currently on Lovenox 120mg  every 12 hours, and holding Coumadin prior to hernia surgery on 08/29/14.  Last dosage of Coumadin was on 08/23/14, first dosage of Lovenox on 08/25/14.  Pt states he was helping a friend move a couch on Saturday 08/25/14 and rested the couch on his thighs during the moving process.  I advised pt since he was on Lovenox his blood is anticoagulated, so easy bruising could result.  Since the bruising was provoked by baring the weight of the couch on his thighs this may just be a result of that.  Advised pt to take a sharpie marker and draw a boarder around the outside of the bruise and continue to monitor it.  If the bruising continue to grow and spread outside of the boarder then he is to call back to office for appt to assess bruising.  Pt verbalized understanding.

## 2014-08-28 MED ORDER — CHLORHEXIDINE GLUCONATE 4 % EX LIQD: 1.0000 "application " | Freq: Once | CUTANEOUS | Status: DC

## 2014-08-28 MED ORDER — CEFAZOLIN SODIUM-DEXTROSE 2-3 GM-% IV SOLR: 2.0000 g | INTRAVENOUS | Status: DC

## 2014-08-29 ENCOUNTER — Ambulatory Visit (HOSPITAL_COMMUNITY)
Admission: RE | Admit: 2014-08-29 | Discharge: 2014-08-29 | Disposition: A | Discharge status: Home / Self Care | Payer: Medicare Other | Source: Ambulatory Visit | Attending: Surgery | Admitting: Surgery

## 2014-08-29 ENCOUNTER — Encounter (HOSPITAL_COMMUNITY)
Admission: RE | Disposition: A | Discharge status: Home / Self Care | Payer: Self-pay | Source: Ambulatory Visit | Attending: Surgery

## 2014-08-29 ENCOUNTER — Ambulatory Visit (INDEPENDENT_AMBULATORY_CARE_PROVIDER_SITE_OTHER): Payer: Medicare Other | Admitting: Pharmacist

## 2014-08-29 DIAGNOSIS — Z5181 Encounter for therapeutic drug level monitoring: Secondary | ICD-10-CM

## 2014-08-29 DIAGNOSIS — I4891 Unspecified atrial fibrillation: Secondary | ICD-10-CM | POA: Diagnosis not present

## 2014-08-29 DIAGNOSIS — F419 Anxiety disorder, unspecified: Secondary | ICD-10-CM | POA: Insufficient documentation

## 2014-08-29 DIAGNOSIS — J45909 Unspecified asthma, uncomplicated: Secondary | ICD-10-CM | POA: Insufficient documentation

## 2014-08-29 DIAGNOSIS — I481 Persistent atrial fibrillation: Secondary | ICD-10-CM

## 2014-08-29 DIAGNOSIS — M1990 Unspecified osteoarthritis, unspecified site: Secondary | ICD-10-CM | POA: Insufficient documentation

## 2014-08-29 DIAGNOSIS — I749 Embolism and thrombosis of unspecified artery: Secondary | ICD-10-CM

## 2014-08-29 DIAGNOSIS — Z5309 Procedure and treatment not carried out because of other contraindication: Secondary | ICD-10-CM | POA: Insufficient documentation

## 2014-08-29 DIAGNOSIS — F329 Major depressive disorder, single episode, unspecified: Secondary | ICD-10-CM | POA: Insufficient documentation

## 2014-08-29 DIAGNOSIS — I10 Essential (primary) hypertension: Secondary | ICD-10-CM | POA: Insufficient documentation

## 2014-08-29 DIAGNOSIS — Z87891 Personal history of nicotine dependence: Secondary | ICD-10-CM | POA: Insufficient documentation

## 2014-08-29 DIAGNOSIS — G4730 Sleep apnea, unspecified: Secondary | ICD-10-CM | POA: Insufficient documentation

## 2014-08-29 DIAGNOSIS — K4091 Unilateral inguinal hernia, without obstruction or gangrene, recurrent: Secondary | ICD-10-CM | POA: Insufficient documentation

## 2014-08-29 DIAGNOSIS — I4819 Other persistent atrial fibrillation: Secondary | ICD-10-CM

## 2014-08-29 LAB — POCT INR: INR: 1.1

## 2014-08-29 SURGERY — REPAIR, HERNIA, INGUINAL, ADULT
Anesthesia: Regional | Laterality: Right

## 2014-08-29 NOTE — Anesthesia Preprocedure Evaluation (Deleted)
Anesthesia Evaluation  Patient identified by MRN, date of birth, ID band Patient awake    Reviewed: Allergy & Precautions, H&P , NPO status , Patient's Chart, lab work & pertinent test results  Airway Mallampati: II  TM Distance: >3 FB Neck ROM: Full    Dental no notable dental hx. (+) Teeth Intact, Dental Advisory Given   Pulmonary asthma , sleep apnea , former smoker,  breath sounds clear to auscultation  Pulmonary exam normal       Cardiovascular hypertension, Pt. on medications + dysrhythmias Atrial Fibrillation Rhythm:Irregular Rate:Normal     Neuro/Psych Anxiety Depression negative neurological ROS     GI/Hepatic negative GI ROS, Neg liver ROS,   Endo/Other  negative endocrine ROS  Renal/GU negative Renal ROS  negative genitourinary   Musculoskeletal  (+) Arthritis -, Osteoarthritis,    Abdominal   Peds  Hematology negative hematology ROS (+)   Anesthesia Other Findings   Reproductive/Obstetrics negative OB ROS                             Anesthesia Physical Anesthesia Plan  ASA: III  Anesthesia Plan: General and Regional   Post-op Pain Management: GA combined w/ Regional for post-op pain   Induction: Intravenous  Airway Management Planned: LMA and Oral ETT  Additional Equipment:   Intra-op Plan:   Post-operative Plan: Extubation in OR  Informed Consent: I have reviewed the patients History and Physical, chart, labs and discussed the procedure including the risks, benefits and alternatives for the proposed anesthesia with the patient or authorized representative who has indicated his/her understanding and acceptance.   Dental advisory given  Plan Discussed with: CRNA  Anesthesia Plan Comments:         Anesthesia Quick Evaluation

## 2014-08-29 NOTE — Progress Notes (Signed)
Per Dr. Gershon Crane, pt to be cancelled for OR due to bruising of surgical area.

## 2014-08-29 NOTE — Progress Notes (Signed)
Called to see pt. For bruising of thighs.  The bruising has increased since the markings that he did instructed by the staff at the [facility]. Call to Dr. Georgette Dover, informed of pt. Condition of thighs, he will see pt. Soon.

## 2014-09-03 ENCOUNTER — Ambulatory Visit (INDEPENDENT_AMBULATORY_CARE_PROVIDER_SITE_OTHER): Payer: Medicare Other | Admitting: Pharmacist

## 2014-09-03 DIAGNOSIS — I749 Embolism and thrombosis of unspecified artery: Secondary | ICD-10-CM | POA: Diagnosis not present

## 2014-09-03 DIAGNOSIS — I4891 Unspecified atrial fibrillation: Secondary | ICD-10-CM | POA: Diagnosis not present

## 2014-09-03 DIAGNOSIS — Z5181 Encounter for therapeutic drug level monitoring: Secondary | ICD-10-CM | POA: Diagnosis not present

## 2014-09-03 DIAGNOSIS — I481 Persistent atrial fibrillation: Secondary | ICD-10-CM | POA: Diagnosis not present

## 2014-09-03 DIAGNOSIS — I4819 Other persistent atrial fibrillation: Secondary | ICD-10-CM

## 2014-09-03 LAB — POCT INR: INR: 1.1

## 2014-09-07 ENCOUNTER — Ambulatory Visit (INDEPENDENT_AMBULATORY_CARE_PROVIDER_SITE_OTHER): Payer: Medicare Other | Admitting: Pharmacist

## 2014-09-07 DIAGNOSIS — I481 Persistent atrial fibrillation: Secondary | ICD-10-CM | POA: Diagnosis not present

## 2014-09-07 DIAGNOSIS — I749 Embolism and thrombosis of unspecified artery: Secondary | ICD-10-CM

## 2014-09-07 DIAGNOSIS — I4891 Unspecified atrial fibrillation: Secondary | ICD-10-CM | POA: Diagnosis not present

## 2014-09-07 DIAGNOSIS — Z5181 Encounter for therapeutic drug level monitoring: Secondary | ICD-10-CM | POA: Diagnosis not present

## 2014-09-07 DIAGNOSIS — I4819 Other persistent atrial fibrillation: Secondary | ICD-10-CM

## 2014-09-07 LAB — POCT INR: INR: 1.5

## 2014-09-11 ENCOUNTER — Other Ambulatory Visit: Payer: Self-pay | Admitting: Internal Medicine

## 2014-09-14 ENCOUNTER — Ambulatory Visit (INDEPENDENT_AMBULATORY_CARE_PROVIDER_SITE_OTHER): Payer: Medicare Other | Admitting: *Deleted

## 2014-09-14 DIAGNOSIS — I749 Embolism and thrombosis of unspecified artery: Secondary | ICD-10-CM | POA: Diagnosis not present

## 2014-09-14 DIAGNOSIS — I4891 Unspecified atrial fibrillation: Secondary | ICD-10-CM | POA: Diagnosis not present

## 2014-09-14 DIAGNOSIS — Z5181 Encounter for therapeutic drug level monitoring: Secondary | ICD-10-CM

## 2014-09-14 DIAGNOSIS — I4819 Other persistent atrial fibrillation: Secondary | ICD-10-CM

## 2014-09-14 LAB — POCT INR
INR: 1.4
INR: 1.4

## 2014-09-21 ENCOUNTER — Ambulatory Visit (INDEPENDENT_AMBULATORY_CARE_PROVIDER_SITE_OTHER): Payer: Medicare Other

## 2014-09-21 DIAGNOSIS — Z5181 Encounter for therapeutic drug level monitoring: Secondary | ICD-10-CM

## 2014-09-21 DIAGNOSIS — I481 Persistent atrial fibrillation: Secondary | ICD-10-CM | POA: Diagnosis not present

## 2014-09-21 DIAGNOSIS — I4891 Unspecified atrial fibrillation: Secondary | ICD-10-CM | POA: Diagnosis not present

## 2014-09-21 DIAGNOSIS — I749 Embolism and thrombosis of unspecified artery: Secondary | ICD-10-CM

## 2014-09-21 DIAGNOSIS — I4819 Other persistent atrial fibrillation: Secondary | ICD-10-CM

## 2014-09-21 LAB — POCT INR: INR: 1.8

## 2014-10-02 ENCOUNTER — Ambulatory Visit (INDEPENDENT_AMBULATORY_CARE_PROVIDER_SITE_OTHER): Payer: Medicare Other

## 2014-10-02 DIAGNOSIS — I4891 Unspecified atrial fibrillation: Secondary | ICD-10-CM | POA: Diagnosis not present

## 2014-10-02 DIAGNOSIS — Z5181 Encounter for therapeutic drug level monitoring: Secondary | ICD-10-CM

## 2014-10-02 DIAGNOSIS — I749 Embolism and thrombosis of unspecified artery: Secondary | ICD-10-CM | POA: Diagnosis not present

## 2014-10-02 DIAGNOSIS — I481 Persistent atrial fibrillation: Secondary | ICD-10-CM | POA: Diagnosis not present

## 2014-10-02 DIAGNOSIS — I4819 Other persistent atrial fibrillation: Secondary | ICD-10-CM

## 2014-10-02 LAB — POCT INR: INR: 2

## 2014-10-02 MED ORDER — ENOXAPARIN SODIUM 120 MG/0.8ML ~~LOC~~ SOLN: 120.0000 mg | Freq: Two times a day (BID) | SUBCUTANEOUS | Status: DC

## 2014-10-02 NOTE — Patient Instructions (Addendum)
10/10/14 Take your last dosage of Coumadin.  10/11/14 No Coumadin, No Lovenox. Per surgeon inject Lovenox on Left side given hernia R side. 10/12/14 Start taking Lovenox 120mg  in the am, and Lovenox 120mg  in the pm. 10/13/14 Take Lovenox 120mg  in the am, and Lovenox 120mg  in the pm. 10/14/14 Take Lovenox 120mg  in the am, and Lovenox 120mg  in the pm. 10/15/14 Take your last dosage of Lovenox 120mg  in the am, No Lovenox in the pm prior to surgery on 10/16/14.  Resume Lovenox 120mg  twice daily (every 12 hrs) post surgery once MD states ok to do so. Resume Coumadin after surgery once MD states OK to do so, take an extra 1/2 tablet x 2 doses, then resume your previous dosage regimen 1 tablet everyday except 1.5 tablets on Fridays. Continue on both Coumadin and Lovenox until INR check is 2.0 or greater.

## 2014-10-05 ENCOUNTER — Ambulatory Visit: Payer: Self-pay | Admitting: Surgery

## 2014-10-06 NOTE — Pre-Procedure Instructions (Signed)
Dezmon Conover Minish Jr.  10/06/2014      [facility] - [place], [place] [address] Phone: [phone] Fax: [phone]    Your procedure is scheduled on Tues, Oct 11 @ 12:00 PM  Report to [facility] Admitting at 10:00 AM  Call this number if you have problems the morning of surgery:  [phone]   Remember:  Do not eat food or drink liquids after midnight.  Take these medicines the morning of surgery with A SIP OF WATER Amiodarone(Pacerone),Lexapro(Escitalopram),and Flomax(Tamsulosin)               No Goody's,BC's,Aleve,Aspirin,Ibuprofen,Sherrard Oil,and any Herbal Medications.    Do not wear jewelry, make-up or nail polish.  Do not wear lotions, powders, or perfumes.  You may wear deodorant.  Do not shave 48 hours prior to surgery.  Men may shave face and neck.  Do not bring valuables to the hospital.  [facility] is not responsible for any belongings or valuables.  Contacts, dentures or bridgework may not be worn into surgery.  Leave your suitcase in the car.  After surgery it may be brought to your room.  For patients admitted to the hospital, discharge time will be determined by your treatment team.  Patients discharged the day of surgery will not be allowed to drive home.    Special instructions:  [facility] - Preparing for Surgery  Before surgery, you can play an important role.  Because skin is not sterile, your skin needs to be as free of germs as possible.  You can reduce the number of germs on you skin by washing with CHG (chlorahexidine gluconate) soap before surgery.  CHG is an antiseptic cleaner which kills germs and bonds with the skin to continue killing germs even after washing.  Please DO NOT use if you have an allergy to CHG or antibacterial soaps.  If your skin becomes reddened/irritated stop using the CHG and inform your nurse when you arrive at Short Stay.  Do not shave (including legs  and underarms) for at least 48 hours prior to the first CHG shower.  You may shave your face.  Please follow these instructions carefully:   1.  Shower with CHG Soap the night before surgery and the                                morning of Surgery.  2.  If you choose to wash your hair, wash your hair first as usual with your       normal shampoo.  3.  After you shampoo, rinse your hair and body thoroughly to remove the                      Shampoo.  4.  Use CHG as you would any other liquid soap.  You can apply chg directly       to the skin and wash gently with scrungie or a clean washcloth.  5.  Apply the CHG Soap to your body ONLY FROM THE NECK DOWN.        Do not use on open wounds or open sores.  Avoid contact with your eyes,       ears, mouth and genitals (private parts).  Wash genitals (private parts)       with your normal soap.  6.  Wash thoroughly, paying special attention to the area where your surgery        will be performed.  7.  Thoroughly rinse your body with warm water from the neck down.  8.  DO NOT shower/wash with your normal soap after using and rinsing off       the CHG Soap.  9.  Pat yourself dry with a clean towel.            10.  Wear clean pajamas.            11.  Place clean sheets on your bed the night of your first shower and do not        sleep with pets.  Day of Surgery  Do not apply any lotions/deoderants the morning of surgery.  Please wear clean clothes to the hospital/surgery center.    Please read over the following fact sheets that you were given. Pain Booklet, Coughing and Deep Breathing and Anesthesia Post-op Instructions

## 2014-10-08 ENCOUNTER — Encounter (HOSPITAL_COMMUNITY)
Admission: RE | Admit: 2014-10-08 | Discharge: 2014-10-08 | Disposition: A | Discharge status: Home / Self Care | Payer: Medicare Other | Source: Ambulatory Visit | Attending: Surgery | Admitting: Surgery

## 2014-10-08 ENCOUNTER — Encounter (HOSPITAL_COMMUNITY): Payer: Self-pay

## 2014-10-08 ENCOUNTER — Encounter (HOSPITAL_COMMUNITY): Payer: Self-pay | Admitting: Vascular Surgery

## 2014-10-08 DIAGNOSIS — Z01818 Encounter for other preprocedural examination: Secondary | ICD-10-CM | POA: Diagnosis not present

## 2014-10-08 LAB — CBC
HCT: 40.9 % (ref 39.0–52.0)
Hemoglobin: 13.7 g/dL (ref 13.0–17.0)
MCH: 30.6 pg (ref 26.0–34.0)
MCHC: 33.5 g/dL (ref 30.0–36.0)
MCV: 91.5 fL (ref 78.0–100.0)
Platelets: 149 10*3/uL — ABNORMAL LOW (ref 150–400)
RBC: 4.47 MIL/uL (ref 4.22–5.81)
RDW: 13.8 % (ref 11.5–15.5)
WBC: 5.7 10*3/uL (ref 4.0–10.5)

## 2014-10-08 LAB — BASIC METABOLIC PANEL
Anion gap: 10 (ref 5–15)
BUN: 20 mg/dL (ref 6–20)
CO2: 28 mmol/L (ref 22–32)
Calcium: 8.8 mg/dL — ABNORMAL LOW (ref 8.9–10.3)
Chloride: 101 mmol/L (ref 101–111)
Creatinine, Ser: 0.85 mg/dL (ref 0.61–1.24)
GFR calc Af Amer: >60 mL/min (ref >60)
GFR calc non Af Amer: >60 mL/min (ref >60)
Glucose, Bld: 98 mg/dL (ref 65–99)
Potassium: 3 mmol/L — ABNORMAL LOW (ref 3.5–5.1)
Sodium: 139 mmol/L (ref 135–145)

## 2014-10-08 LAB — PROTIME-INR
INR: 2.04 — ABNORMAL HIGH (ref 0.00–1.49)
Prothrombin Time: 22.9 seconds — ABNORMAL HIGH (ref 11.6–15.2)

## 2014-10-08 NOTE — Progress Notes (Addendum)
Anesthesia Chart Review:  Pt is 64 year old male scheduled for open repair of inguinal hernia on 10/16/2014 with Dr. Georgette Dover.   Pt was originally scheduled for this surgery 08/29/14 but it was cancelled DOS due to bruising in the area of surgery.   Cardiologist is Dr. Rayann Heman. PCP is Dr. Nilda Simmer  PMH includes: persistent atrial fibrillation, mitral regurgitation, HTN, OSA (does not use CPAP), bilateral PE (2014), asthma, hyperlipidemia, thrombus of L atrial appendage (on lifelong anticoagulation). Former smoker. BMI 31. S/p L shoulder arthroscopy 03/08/14. S/p R inguinal hernia repair 07/28/13.   BP at PAT was 178/110, and 180/108, 178/102 on recheck.   Medications include: amiodarone, lovenox, losartan, coumadin. Pt is to stop coumadin 6 days prior to surgery and start lovenox bridge.   Preoperative labs reviewed. PT 22.9. Will be repeated DOS.   Chest x-ray 05/26/2014 reviewed. No acute cardiopulmonary process.   EKG 08/24/2014: NSR.   Nuclear stress test 07/18/2013:  -Intermediate risk stress nuclear study with fixed inferior defect, more suggestive of artifact than scar. LV Ejection Fraction: 49%. LV Wall Motion: Mild inferior hypokinesis and incoordinate septal motion. -Dr. Rayann Heman comments in the notes section of test results "Inferior defect appears to be due to gut attenuation artifact. I believe that this is a low risk study. Proceed with surgery if medically indicated. No further CV testing at this time."   TEE 04/24/2013:  - Left ventricle: No evidence of thrombus. - Mitral valve: Mild regurgitation. - Left atrium: The atrium was dilated. No evidence of thrombus in the atrial cavity or appendage. No evidence of thrombus in the atrial cavity or appendage. The appendage was well visualized and moderately dilated. Emptying velocity was mildly reduced. - Recommendations: No evidence of thrombus in left atrium or left atrial appendage. The TEE was followed by a successful  cardioversion. -Impressions: No cardiac source of emboli was indentified. Successful cardioversion.  Pt has cardiac clearance from Dr. Rayann Heman for surgery (found in media tab dated 07/31/2014).   Have called pt and left message to discuss uncontrolled HTN.   If BP acceptable DOS, I anticipate pt can proceed as scheduled.   Willeen Cass, FNP-BC [facility] Short Stay Surgical Center/Anesthesiology Phone: [phone] 10/08/2014 4:41 PM  Addendum: I have spoken with patient several times today. He was seen by his PCP Dr. Claiborne Billings ([facility]; see Care Everywhere). BP was 150/98. Losartan was increased to 100 mg Q AM. He also discussed stopping NSAIDS, limiting salt intake, and avoiding decongestants. Patient has a BP monitor at home.  I advised that he check his BP at least once a day until surgery and let Dr. Claiborne Billings know if his DBP is consistently > 100 or SBP consistently > 180.   I discussed anti-hypertensive medication instructions for the day of surgery with anesthesiologist Dr. Kalman Shan. Patient is only on an ARB which we typically hold the morning of surgery due to concerns of perioperative hypotension. Per Dr. Kalman Shan hold losartan on the morning of surgery. Patient's surgery is not scheduled until 12N. I advised that he take his BP before 8 AM and if his SBP is > 180 or DBP is > 100 then to call [facility], so a nurse can discuss with an anesthesiologist if they want him to take his ARB.  Hopefully, he will have overall better HTN control over this next week on the higher losartan dose. I'll send a staff message to Dr. Georgette Dover giving him an update.  Myra Gianotti, PA-C [facility] Short Stay Center/Anesthesiology Phone (  [phone] 10/09/2014 4:45 PM  Addendum: Patient called. He has been checking his BP at home typically twice a day since his losartan was increased to 100 mg daily. BP range has been ~ 178/100 - 190/115, even after taking losartan. He does not want to come in again and get his surgery  canceled. He is also worried about his elevated pressures. He has not had any issues with hernia incarceration or significant symptoms from his hernia in at least a month. Dr. Georgette Dover was on-call over night, so unfortunately, I could not talk with him this morning. [facility] triage nurse forwarded me to the [facility] advanced practice provider working with Dr. Donne Hazel today. He is going to get back in touch with Dr. Georgette Dover or his nurse to have them address.  I updated Dr. Kalman Shan who agrees that patient is a high risk to get canceled if his BP reading on arrival is not lower than his home readings have been. Patient spoke with Dr. Evette Georges nurse who felt he may need another 2-3 weeks to see if his BP would respond to the increased losartan dose. Patient would like to postpone surgery, so I asked that he contact Dr. Vonna Kotyk office/nurse as well.   [facility] Short Stay Center/Anesthesiology Phone [phone] 10/15/2014 10:26 AM

## 2014-10-08 NOTE — Progress Notes (Addendum)
PCP is Dr Nilda Simmer Cardiologist is Dr Rayann Heman Echo and  Stress test noted in epic from 2015. See note from [place] Dec 08-21-14 Clearance note from Dr Rayann Heman under media tab 07-2014 Pt reports he was told to stop coumadin 6 days before his surgery day and start Lovenox. He reports that he has a schedule and instructions as to when to stop coumadin and start Lovenox. Pt instructed to check  up with his PCP due to his elevated BP before surgery. Manual BP today was 178/110 Rechecked Bp manual before leaving was 180/108 in Left arm and 178/102 on right arm Pt  States his bp has been running high

## 2014-10-16 ENCOUNTER — Encounter (HOSPITAL_COMMUNITY): Admission: RE | Payer: Self-pay | Source: Ambulatory Visit

## 2014-10-16 ENCOUNTER — Ambulatory Visit (HOSPITAL_COMMUNITY): Admission: RE | Admit: 2014-10-16 | Payer: Medicare Other | Source: Ambulatory Visit | Admitting: Surgery

## 2014-10-16 SURGERY — REPAIR, HERNIA, INGUINAL, ADULT
Anesthesia: General | Laterality: Right

## 2014-10-19 ENCOUNTER — Ambulatory Visit (INDEPENDENT_AMBULATORY_CARE_PROVIDER_SITE_OTHER): Payer: Medicare Other | Admitting: *Deleted

## 2014-10-19 DIAGNOSIS — I4891 Unspecified atrial fibrillation: Secondary | ICD-10-CM | POA: Diagnosis not present

## 2014-10-19 DIAGNOSIS — I749 Embolism and thrombosis of unspecified artery: Secondary | ICD-10-CM | POA: Diagnosis not present

## 2014-10-19 DIAGNOSIS — I481 Persistent atrial fibrillation: Secondary | ICD-10-CM | POA: Diagnosis not present

## 2014-10-19 DIAGNOSIS — Z5181 Encounter for therapeutic drug level monitoring: Secondary | ICD-10-CM | POA: Diagnosis not present

## 2014-10-19 DIAGNOSIS — I4819 Other persistent atrial fibrillation: Secondary | ICD-10-CM

## 2014-10-19 LAB — POCT INR: INR: 1.5

## 2014-10-23 ENCOUNTER — Ambulatory Visit (INDEPENDENT_AMBULATORY_CARE_PROVIDER_SITE_OTHER): Payer: Medicare Other

## 2014-10-23 DIAGNOSIS — I481 Persistent atrial fibrillation: Secondary | ICD-10-CM | POA: Diagnosis not present

## 2014-10-23 DIAGNOSIS — I4819 Other persistent atrial fibrillation: Secondary | ICD-10-CM

## 2014-10-23 DIAGNOSIS — I749 Embolism and thrombosis of unspecified artery: Secondary | ICD-10-CM

## 2014-10-23 DIAGNOSIS — I4891 Unspecified atrial fibrillation: Secondary | ICD-10-CM

## 2014-10-23 DIAGNOSIS — Z5181 Encounter for therapeutic drug level monitoring: Secondary | ICD-10-CM | POA: Diagnosis not present

## 2014-10-23 LAB — POCT INR: INR: 1.9

## 2014-11-07 ENCOUNTER — Ambulatory Visit (INDEPENDENT_AMBULATORY_CARE_PROVIDER_SITE_OTHER): Payer: Medicare Other | Admitting: *Deleted

## 2014-11-07 DIAGNOSIS — I4891 Unspecified atrial fibrillation: Secondary | ICD-10-CM | POA: Diagnosis not present

## 2014-11-07 DIAGNOSIS — I749 Embolism and thrombosis of unspecified artery: Secondary | ICD-10-CM | POA: Diagnosis not present

## 2014-11-07 DIAGNOSIS — I4819 Other persistent atrial fibrillation: Secondary | ICD-10-CM

## 2014-11-07 DIAGNOSIS — Z5181 Encounter for therapeutic drug level monitoring: Secondary | ICD-10-CM

## 2014-11-07 DIAGNOSIS — I481 Persistent atrial fibrillation: Secondary | ICD-10-CM | POA: Diagnosis not present

## 2014-11-07 LAB — POCT INR: INR: 2

## 2014-11-21 ENCOUNTER — Ambulatory Visit (INDEPENDENT_AMBULATORY_CARE_PROVIDER_SITE_OTHER): Payer: Medicare Other | Admitting: *Deleted

## 2014-11-21 DIAGNOSIS — I481 Persistent atrial fibrillation: Secondary | ICD-10-CM

## 2014-11-21 DIAGNOSIS — I749 Embolism and thrombosis of unspecified artery: Secondary | ICD-10-CM | POA: Diagnosis not present

## 2014-11-21 DIAGNOSIS — Z5181 Encounter for therapeutic drug level monitoring: Secondary | ICD-10-CM

## 2014-11-21 DIAGNOSIS — I4819 Other persistent atrial fibrillation: Secondary | ICD-10-CM

## 2014-11-21 DIAGNOSIS — I4891 Unspecified atrial fibrillation: Secondary | ICD-10-CM | POA: Diagnosis not present

## 2014-11-21 LAB — POCT INR: INR: 2.8

## 2014-11-27 ENCOUNTER — Ambulatory Visit (HOSPITAL_COMMUNITY)
Admission: RE | Admit: 2014-11-27 | Discharge: 2014-11-27 | Disposition: A | Discharge status: Home / Self Care | Payer: Medicare Other | Source: Ambulatory Visit | Attending: Nurse Practitioner | Admitting: Nurse Practitioner

## 2014-11-27 ENCOUNTER — Encounter (HOSPITAL_COMMUNITY): Payer: Self-pay | Admitting: Nurse Practitioner

## 2014-11-27 VITALS — BP 132/94 | HR 66 | Ht 76.0 in | Wt 264.2 lb

## 2014-11-27 DIAGNOSIS — I481 Persistent atrial fibrillation: Secondary | ICD-10-CM

## 2014-11-27 DIAGNOSIS — I10 Essential (primary) hypertension: Secondary | ICD-10-CM | POA: Insufficient documentation

## 2014-11-27 DIAGNOSIS — I4819 Other persistent atrial fibrillation: Secondary | ICD-10-CM

## 2014-11-27 LAB — HEPATIC FUNCTION PANEL
ALT: 19 U/L (ref 17–63)
AST: 26 U/L (ref 15–41)
Albumin: 4.2 g/dL (ref 3.5–5.0)
Alkaline Phosphatase: 49 U/L (ref 38–126)
Bilirubin, Direct: 0.1 mg/dL (ref 0.1–0.5)
Indirect Bilirubin: 0.5 mg/dL (ref 0.3–0.9)
Total Bilirubin: 0.6 mg/dL (ref 0.3–1.2)
Total Protein: 7 g/dL (ref 6.5–8.1)

## 2014-11-27 LAB — TSH: TSH: 1.827 u[IU]/mL (ref 0.350–4.500)

## 2014-11-27 NOTE — Patient Instructions (Signed)
Your physician has recommended that you have a pulmonary function test. Pulmonary Function Tests are a group of tests that measure how well air moves in and out of your lungs.  Arrive at the [facility] and ask to be taken to respiratory at 8:45am.  Use Valet parking. No smoking, caffeine or inhalers 4 hours prior to test

## 2014-11-27 NOTE — Progress Notes (Signed)
Patient ID: Brandon [name]., male   DOB: [birth year], 64 y.o.   MRN: [id number]    Primary Care Physician: Nilda Simmer, MD Referring Physician: Dr. Raynaldo Opitz Towry [name] is a 64 y.o. male with a h/o afib maintaining SR on amiodarone. He was scheduled to have hernia repair this past summer, but surgery was cancelled due to various reasons. His hernia currently is manageable. BP is better controlled with the addition of amlodipine. Having some minimal pedal edema for med..  Today, he denies symptoms of palpitations, chest pain, shortness of breath, orthopnea, PND, lower extremity edema, dizziness, presyncope, syncope, or neurologic sequela. The patient is tolerating medications without difficulties and is otherwise without complaint today.   Past Medical History  Diagnosis Date  . Arthritis   . Sciatica   . Anxiety   . Hypertension   . Osteoporosis   . Cataract   . Asthma     Patient and wife both dinies patient has or had asthma  . Enlarged prostate   . Left atrial enlargement   . Persistent atrial fibrillation ([place])   . Obesity   . Bilateral pulmonary embolism ([place]) 3/14    admitted to [facility],  treated with Xarelto  . Chronic pain   . Thrombus of left atrial appendage 03/09/2013  . Mitral regurgitation 04/24/2013    Mild by TEE  . Fatigue   . Dyspnea on exertion   . Polydipsia   . Anxiety disorder   . Benign neoplasm of colon   . Benign prostatic hyperplasia with urinary obstruction   . Chest pain   . Encounter for long-term (current) use of other medications   . Hyperlipemia   . Hypopotassemia   . Insomnia   . Major depressive disorder, recurrent episode ([place])   . Osteoarthritis   . Periodic limb movement disorder   . SOB (shortness of breath)   . Thoracic or lumbosacral neuritis or radiculitis, unspecified   . Ventral hernia, unspecified, without mention of obstruction or gangrene     right abdominal wall  . Restless leg syndrome     takes gabapentin  . Dysrhythmia      afib   . OSA (obstructive sleep apnea)     noncompliant with CPAP.  07/27/13- awaiting a CPAP,  . Obstructive sleep apnea    Past Surgical History  Procedure Laterality Date  . Knee arthroscopy      left  . Colonoscopy w/ polypectomy    . Rotator cuff repair      right  . Tonsillectomy    . Cataract extraction    . Hernia repair  07/06/2011  . Replacement total knee Left   . Tee without cardioversion N/A 03/21/2012    Procedure: TRANSESOPHAGEAL ECHOCARDIOGRAM (TEE);  Surgeon: Birdie Riddle, MD;  Location: [place];  Service: Cardiovascular;  Laterality: N/A;  . Cardioversion N/A 03/21/2012    Procedure: CARDIOVERSION;  Surgeon: Birdie Riddle, MD;  Location: [facility] ENDOSCOPY;  Service: Cardiovascular;  Laterality: N/A;  . Tee without cardioversion N/A 04/24/2013    Procedure: TRANSESOPHAGEAL ECHOCARDIOGRAM (TEE);  Surgeon: Dorothy Spark, MD;  Location: [place];  Service: Cardiovascular;  Laterality: N/A;  . Cardioversion N/A 04/24/2013    Procedure: CARDIOVERSION;  Surgeon: Dorothy Spark, MD;  Location: [place];  Service: Cardiovascular;  Laterality: N/A;  . Eye surgery Right     cataract  . Femur fracture surgery    . Inguinal hernia repair Right 07/28/2013    Procedure:  RIGHT INGUINAL HERNIA REPAIR;  Surgeon: Imogene Burn. Georgette Dover, MD;  Location: [place];  Service: General;  Laterality: Right;  . Insertion of mesh Right 07/28/2013    Procedure: INSERTION OF MESH;  Surgeon: Imogene Burn. Georgette Dover, MD;  Location: [place];  Service: General;  Laterality: Right;  . Rotator cuff repair Left 03/08/2014    DR SUPPLE  . Shoulder arthroscopy with rotator cuff repair and subacromial decompression Left 03/08/2014    Procedure: LEFT SHOULDER ARTHROSCOPY WITH ROTATOR CUFF REPAIR/SUBACROMIAL DECOMPRESSION/DISTAL CLAVICLE RESECTION;  Surgeon: Marin Shutter, MD;  Location: [place];  Service: Orthopedics;  Laterality: Left;    Current Outpatient Prescriptions  Medication Sig Dispense Refill  .  amiodarone (PACERONE) 200 MG tablet Take 0.5 tablets (100 mg total) by mouth daily. 30 tablet 3  . amLODipine (NORVASC) 10 MG tablet Take 10 mg by mouth daily.    Marland Kitchen escitalopram (LEXAPRO) 20 MG tablet Take 20 mg by mouth daily.    Marland Kitchen gabapentin (NEURONTIN) 800 MG tablet Take 1 tablet (800 mg total) by mouth See admin instructions. Takes 1 tablet by mouth daily with supper, and take 1 tablet at bedtime as needed 60 tablet 11  . lidocaine (LIDODERM) 5 % Place 1 patch onto the skin daily as needed (knee pain).     Marland Kitchen losartan (COZAAR) 100 MG tablet Take 100 mg by mouth daily.    . tamsulosin (FLOMAX) 0.4 MG CAPS capsule Take 0.4 mg by mouth 2 (two) times daily.     Marland Kitchen warfarin (COUMADIN) 5 MG tablet TAKE AS DIRECTED BY COUMADIN CLINIC (Patient taking differently: TAKE AS DIRECTED BY COUMADIN CLINIC 5MG ALL DAYS EXCEPT FRIDAY, 7.5 MG ON FRIDAY) 30 tablet 3   No current facility-administered medications for this encounter.    Allergies  Allergen Reactions  . Ace Inhibitors Cough    Social History   Social History  . Marital Status: Married    Spouse Name: N/A  . Number of Children: N/A  . Years of Education: N/A   Occupational History  . Not on file.   Social History Main Topics  . Smoking status: Former Research scientist (life sciences)  . Smokeless tobacco: Never Used  . Alcohol Use: 0.0 oz/week    0 Standard drinks or equivalent per week     Comment: 2 drinks per night  . Drug Use: No     Comment: negative hx for IV drug abuse  . Sexual Activity: Not on file   Other Topics Concern  . Not on file   Social History Narrative   Lives with wife in [place]   Retired Dietitian    Family History  Problem Relation Age of Onset  . Cancer Father     bone  . Cancer Paternal Grandmother     ovarian  . CVA Other     Fam Hx of multiple myeloma  . Diabetes Other     Fam Hx of DM    ROS- All systems are reviewed and negative except as per the HPI above  Physical Exam: Filed Vitals:     11/27/14 0844  BP: 132/94  Pulse: 66  Height: 6' 4"  (1.93 m)  Weight: 264 lb 3.2 oz (119.84 kg)    GEN- The patient is well appearing, alert and oriented x 3 today.   Head- normocephalic, atraumatic Eyes-  Sclera clear, conjunctiva pink Ears- hearing intact Oropharynx- clear Neck- supple, no JVP Lymph- no cervical lymphadenopathy Lungs- Clear to ausculation bilaterally, normal work of  breathing Heart- Regular rate and rhythm, no murmurs, rubs or gallops, PMI not laterally displaced GI- soft, NT, ND, + BS Extremities- no clubbing, cyanosis, trace pedal edema MS- no significant deformity or atrophy Skin- no rash or lesion Psych- euthymic mood, full affect Neuro- strength and sensation are intact  EKG- SR at 61 bpm, PR int 190 ms, Qrs int 114 ms, QTc 442 ms Epic records reviewed   Assessment and Plan: 1. afib Maintaining SR Continue amiodarone at 100 mg a day TSH, liver today PFT's  2. HTN Rechecked 130/84  F/u in 3 months   Butch Penny C. Carroll, [facility] [address] [place], [place] [address] [phone]

## 2014-12-03 ENCOUNTER — Ambulatory Visit (HOSPITAL_COMMUNITY)
Admission: RE | Admit: 2014-12-03 | Discharge: 2014-12-03 | Disposition: A | Discharge status: Home / Self Care | Payer: Medicare Other | Source: Ambulatory Visit | Attending: Nurse Practitioner | Admitting: Nurse Practitioner

## 2014-12-03 DIAGNOSIS — I481 Persistent atrial fibrillation: Secondary | ICD-10-CM | POA: Diagnosis present

## 2014-12-03 DIAGNOSIS — Z87891 Personal history of nicotine dependence: Secondary | ICD-10-CM | POA: Insufficient documentation

## 2014-12-03 DIAGNOSIS — I4819 Other persistent atrial fibrillation: Secondary | ICD-10-CM

## 2014-12-03 LAB — PULMONARY FUNCTION TEST
DL/VA % pred: 73 %
DL/VA: 3.6 ml/min/mmHg/L
DLCO unc % pred: 84 %
DLCO unc: 33.99 ml/min/mmHg
FEF 25-75 Post: 3.22 L/sec
FEF 25-75 Pre: 2.14 L/sec
FEF2575-%Change-Post: 50 %
FEF2575-%Pred-Post: 99 %
FEF2575-%Pred-Pre: 66 %
FEV1-%Change-Post: 13 %
FEV1-%Pred-Post: 101 %
FEV1-%Pred-Pre: 89 %
FEV1-Post: 4.24 L
FEV1-Pre: 3.74 L
FEV1FVC-%Change-Post: 11 %
FEV1FVC-%Pred-Pre: 85 %
FEV6-%Change-Post: 3 %
FEV6-%Pred-Post: 109 %
FEV6-%Pred-Pre: 105 %
FEV6-Post: 5.87 L
FEV6-Pre: 5.66 L
FEV6FVC-%Change-Post: 2 %
FEV6FVC-%Pred-Post: 102 %
FEV6FVC-%Pred-Pre: 100 %
FVC-%Change-Post: 1 %
FVC-%Pred-Post: 107 %
FVC-%Pred-Pre: 105 %
FVC-Post: 6.01 L
FVC-Pre: 5.92 L
Post FEV1/FVC ratio: 70 %
Post FEV6/FVC ratio: 98 %
Pre FEV1/FVC ratio: 63 %
Pre FEV6/FVC Ratio: 96 %
RV % pred: 105 %
RV: 2.86 L
TLC % pred: 117 %
TLC: 9.71 L

## 2014-12-03 MED ORDER — ALBUTEROL SULFATE (2.5 MG/3ML) 0.083% IN NEBU
2.5000 mg | INHALATION_SOLUTION | Freq: Once | RESPIRATORY_TRACT | Status: AC
  Administered 2014-12-03: 2.5 mg via RESPIRATORY_TRACT

## 2014-12-05 ENCOUNTER — Other Ambulatory Visit: Payer: Self-pay | Admitting: Internal Medicine

## 2014-12-12 ENCOUNTER — Ambulatory Visit (INDEPENDENT_AMBULATORY_CARE_PROVIDER_SITE_OTHER): Payer: Medicare Other | Admitting: *Deleted

## 2014-12-12 ENCOUNTER — Other Ambulatory Visit: Payer: Self-pay | Admitting: Internal Medicine

## 2014-12-12 DIAGNOSIS — I749 Embolism and thrombosis of unspecified artery: Secondary | ICD-10-CM

## 2014-12-12 DIAGNOSIS — I4891 Unspecified atrial fibrillation: Secondary | ICD-10-CM

## 2014-12-12 DIAGNOSIS — I4819 Other persistent atrial fibrillation: Secondary | ICD-10-CM

## 2014-12-12 DIAGNOSIS — Z5181 Encounter for therapeutic drug level monitoring: Secondary | ICD-10-CM

## 2014-12-12 DIAGNOSIS — I481 Persistent atrial fibrillation: Secondary | ICD-10-CM | POA: Diagnosis not present

## 2014-12-12 LAB — POCT INR: INR: 3.6

## 2014-12-13 ENCOUNTER — Encounter: Payer: Self-pay | Admitting: Neurology

## 2014-12-13 ENCOUNTER — Ambulatory Visit (INDEPENDENT_AMBULATORY_CARE_PROVIDER_SITE_OTHER): Payer: Medicare Other | Admitting: Neurology

## 2014-12-13 VITALS — BP 112/70 | HR 60 | Resp 16 | Ht 76.0 in | Wt 279.6 lb

## 2014-12-13 DIAGNOSIS — R3915 Urgency of urination: Secondary | ICD-10-CM | POA: Diagnosis not present

## 2014-12-13 DIAGNOSIS — G2581 Restless legs syndrome: Secondary | ICD-10-CM | POA: Diagnosis not present

## 2014-12-13 DIAGNOSIS — I481 Persistent atrial fibrillation: Secondary | ICD-10-CM

## 2014-12-13 DIAGNOSIS — G4733 Obstructive sleep apnea (adult) (pediatric): Secondary | ICD-10-CM | POA: Diagnosis not present

## 2014-12-13 DIAGNOSIS — I4819 Other persistent atrial fibrillation: Secondary | ICD-10-CM

## 2014-12-13 DIAGNOSIS — M25559 Pain in unspecified hip: Secondary | ICD-10-CM

## 2014-12-13 MED ORDER — CYCLOBENZAPRINE HCL 5 MG PO TABS: 5.0000 mg | ORAL_TABLET | Freq: Three times a day (TID) | ORAL | Status: DC | PRN

## 2014-12-13 NOTE — Progress Notes (Signed)
[facility]  PATIENT: Brandon [name]. DOB: 05-28-1950  REFERRING CLINICIAN: Scarlette Ar  HISTORY FROM: Patient  REASON FOR VISIT: OSA and RLS   HISTORICAL  CHIEF COMPLAINT:  Chief Complaint  Patient presents with  . Sleep Apnea    Sts. he has not had a home sleep study--sts. nobody called him to set it up.  Sts. RLS sx. are well controlled on Gabapentin--sts. he stopped Valium because he was told people over 54 should  not take it./fim    HISTORY OF PRESENT ILLNESS:  Brandon [name] is 64 yo man with obstructive sleep apnea and restless leg syndrome.  OSA:   He was diagnosed with OSA but didn't tolerate CPAP and turned it in (2015).   He was unable CPAP but had issues getting a home sleep study.    He felt mildly claustophobic and was uncomfortable with CPAP so he often took mask off in middle of the night.   As he thinks he could tolerate CPAP better than an oral appliance and he has continued HTN issues, he would like to try CPAP again.    He had moderate OSA on the past and has since lost some weight.   He does not know the pressure used.     No major sleepiness.   He breaths through his mouth and he notes dry mouth  RLS/PLMS:   He also had severe RLS/PLMS.   Gabapentin has helped.      For a while he was on valium/gabapentin combination but combo was just slightly better and he stopped valium  Nocturia:   He often wakes up 3 times to urinate (usually goes 4-5 hours straight and then up every hour).   He has hesitancy and urgency and is doing much better on Flomax (was having nocturia 4-5 times nightly).    Back pain/sciatica is worse and interfering with sleep.   He sees ortho in 2 weeks.    He feels his lower back is stiff and tender   EPWORTH SLEEPINESS SCALE  On a scale of 0 - 3 what is the chance of dozing:  Sitting and Reading:   0 Watching TV:    2 Sitting inactive in a public place: 0 Passenger in car for one hour: 0 Lying down to rest in the  afternoon: 2 Sitting and talking to someone: 0 Sitting quietly after lunch:  0 In a car, stopped in traffic:  0  Total (out of 24):   4/24   REVIEW OF SYSTEMS:  Constitutional: No fevers, chills, sweats, or change in appetite.   RLS/PLMS Eyes: No visual changes, double vision, eye pain Ear, nose and throat: No hearing loss, ear pain, nasal congestion, sore throat.   Notes tinnitus. Cardiovascular: No chest pain, palpitations Respiratory:  No shortness of breath at rest or with exertion.   Has OSA GastrointestinaI: No nausea, vomiting, diarrhea, abdominal pain, fecal incontinence Genitourinary:  No dysuria, urinary retention or frequency.  No nocturia. Musculoskeletal: he notes back pain.   Has right knee pain Integumentary: No rash, pruritus, skin lesions Neurological: as above Psychiatric: No depression at this time.  No anxiety Endocrine: No palpitations, diaphoresis, change in appetite, change in weigh or increased thirst Hematologic/Lymphatic:  No anemia, purpura, petechiae. Allergic/Immunologic: No itchy/runny eyes, nasal congestion, recent allergic reactions, rashes  ALLERGIES: Allergies  Allergen Reactions  . Ace Inhibitors Cough    HOME MEDICATIONS: Outpatient Prescriptions Prior to Visit  Medication Sig Dispense Refill  .  amiodarone (PACERONE) 200 MG tablet Take 0.5 tablets (100 mg total) by mouth daily. 30 tablet 3  . amLODipine (NORVASC) 10 MG tablet Take 10 mg by mouth daily.    Marland Kitchen escitalopram (LEXAPRO) 20 MG tablet Take 20 mg by mouth daily.    Marland Kitchen gabapentin (NEURONTIN) 800 MG tablet Take 1 tablet (800 mg total) by mouth See admin instructions. Takes 1 tablet by mouth daily with supper, and take 1 tablet at bedtime as needed 60 tablet 11  . lidocaine (LIDODERM) 5 % Place 1 patch onto the skin daily as needed (knee pain).     Marland Kitchen losartan (COZAAR) 100 MG tablet Take 100 mg by mouth daily.    . tamsulosin (FLOMAX) 0.4 MG CAPS capsule Take 0.4 mg by mouth 2 (two) times  daily.     Marland Kitchen warfarin (COUMADIN) 5 MG tablet TAKE AS DIRECTED BY COUMADIN CLINIC (Patient taking differently: TAKE AS DIRECTED BY COUMADIN CLINIC 5MG ALL DAYS EXCEPT FRIDAY, 7.5 MG ON FRIDAY) 30 tablet 3  . amiodarone (PACERONE) 200 MG tablet Take 0.5 tablets (100 mg total) by mouth daily. 15 tablet 5   No facility-administered medications prior to visit.    PAST MEDICAL HISTORY: Past Medical History  Diagnosis Date  . Arthritis   . Sciatica   . Anxiety   . Hypertension   . Osteoporosis   . Cataract   . Asthma     Patient and wife both dinies patient has or had asthma  . Enlarged prostate   . Left atrial enlargement   . Persistent atrial fibrillation ([place])   . Obesity   . Bilateral pulmonary embolism ([place]) 3/14    admitted to [facility],  treated with Xarelto  . Chronic pain   . Thrombus of left atrial appendage 03/09/2013  . Mitral regurgitation 04/24/2013    Mild by TEE  . Fatigue   . Dyspnea on exertion   . Polydipsia   . Anxiety disorder   . Benign neoplasm of colon   . Benign prostatic hyperplasia with urinary obstruction   . Chest pain   . Encounter for long-term (current) use of other medications   . Hyperlipemia   . Hypopotassemia   . Insomnia   . Major depressive disorder, recurrent episode ([place])   . Osteoarthritis   . Periodic limb movement disorder   . SOB (shortness of breath)   . Thoracic or lumbosacral neuritis or radiculitis, unspecified   . Ventral hernia, unspecified, without mention of obstruction or gangrene     right abdominal wall  . Restless leg syndrome     takes gabapentin  . Dysrhythmia     afib   . OSA (obstructive sleep apnea)     noncompliant with CPAP.  07/27/13- awaiting a CPAP,  . Obstructive sleep apnea     PAST SURGICAL HISTORY: Past Surgical History  Procedure Laterality Date  . Knee arthroscopy      left  . Colonoscopy w/ polypectomy    . Rotator cuff repair      right  . Tonsillectomy    . Cataract extraction    . Hernia  repair  07/06/2011  . Replacement total knee Left   . Tee without cardioversion N/A 03/21/2012    Procedure: TRANSESOPHAGEAL ECHOCARDIOGRAM (TEE);  Surgeon: Birdie Riddle, MD;  Location: [place];  Service: Cardiovascular;  Laterality: N/A;  . Cardioversion N/A 03/21/2012    Procedure: CARDIOVERSION;  Surgeon: Birdie Riddle, MD;  Location: [place];  Service: Cardiovascular;  Laterality: N/A;  . Tee without cardioversion N/A 04/24/2013    Procedure: TRANSESOPHAGEAL ECHOCARDIOGRAM (TEE);  Surgeon: Dorothy Spark, MD;  Location: [place];  Service: Cardiovascular;  Laterality: N/A;  . Cardioversion N/A 04/24/2013    Procedure: CARDIOVERSION;  Surgeon: Dorothy Spark, MD;  Location: [place];  Service: Cardiovascular;  Laterality: N/A;  . Eye surgery Right     cataract  . Femur fracture surgery    . Inguinal hernia repair Right 07/28/2013    Procedure: RIGHT INGUINAL HERNIA REPAIR;  Surgeon: Imogene Burn. Georgette Dover, MD;  Location: [place];  Service: General;  Laterality: Right;  . Insertion of mesh Right 07/28/2013    Procedure: INSERTION OF MESH;  Surgeon: Imogene Burn. Georgette Dover, MD;  Location: [place];  Service: General;  Laterality: Right;  . Rotator cuff repair Left 03/08/2014    DR SUPPLE  . Shoulder arthroscopy with rotator cuff repair and subacromial decompression Left 03/08/2014    Procedure: LEFT SHOULDER ARTHROSCOPY WITH ROTATOR CUFF REPAIR/SUBACROMIAL DECOMPRESSION/DISTAL CLAVICLE RESECTION;  Surgeon: Marin Shutter, MD;  Location: [place];  Service: Orthopedics;  Laterality: Left;    FAMILY HISTORY: Family History  Problem Relation Age of Onset  . Cancer Father     bone  . Cancer Paternal Grandmother     ovarian  . CVA Other     Fam Hx of multiple myeloma  . Diabetes Other     Fam Hx of DM    SOCIAL HISTORY:  Social History   Social History  . Marital Status: Married    Spouse Name: N/A  . Number of Children: N/A  . Years of Education: N/A   Occupational History  .  Not on file.   Social History Main Topics  . Smoking status: Former Research scientist (life sciences)  . Smokeless tobacco: Never Used  . Alcohol Use: 0.0 oz/week    0 Standard drinks or equivalent per week     Comment: 2 drinks per night  . Drug Use: No     Comment: negative hx for IV drug abuse  . Sexual Activity: Not on file   Other Topics Concern  . 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   General: The patient is well-developed and well-nourished and in no acute distress  Neck: The neck is supple.  The neck is nontender.   Pharynx is Mallampati 1  Neurologic Exam  Mental status: The patient is alert and oriented x 3 at the time of the examination. The patient has apparent normal recent and remote memory, with an apparently normal attention span and concentration ability.   Cranial nerves: Extraocular movements are full.  Facial symmetry is present. Facial strength is normal. No dysarthria is noted.  The tongue is midline, and the patient has symmetric elevation of the soft palate.   Motor:  Muscle bulk and tone are normal. Strength is  5 / 5 in all 4 extremities.   Sensory: Sensory testing is intact to touch on all 4 extremities.  Gait and station: Station and gait are normal. Tandem gait is mildly wide.   Reflexes: Deep tendon reflexes are symmetric and normal bilaterally. Plantar responses  are normal.    DIAGNOSTIC DATA (LABS, IMAGING, TESTING) - I reviewed patient records, labs, notes, testing and imaging myself where available.  Lab Results  Component Value Date   WBC 5.7 10/08/2014   HGB 13.7 10/08/2014   HCT 40.9 10/08/2014   MCV 91.5 10/08/2014   PLT 149* 10/08/2014      Component Value Date/Time   NA 139 10/08/2014  1003   K 3.0* 10/08/2014 1003   CL 101 10/08/2014 1003   CO2 28 10/08/2014 1003   GLUCOSE 98 10/08/2014 1003   BUN 20 10/08/2014 1003   CREATININE 0.85 10/08/2014 1003   CALCIUM 8.8* 10/08/2014 1003   PROT 7.0 11/27/2014 0900   ALBUMIN 4.2 11/27/2014 0900   AST 26 11/27/2014 0900   ALT 19 11/27/2014 0900   ALKPHOS 49 11/27/2014 0900   BILITOT 0.6 11/27/2014 0900   GFRNONAA >60 10/08/2014 1003   GFRAA >60 10/08/2014 1003   Lab Results  Component Value Date   CHOL 170 03/17/2012   HDL 30* 03/17/2012   LDLCALC 117* 03/17/2012   TRIG 117 03/17/2012   CHOLHDL 5.7 03/17/2012   Lab Results  Component Value Date   HGBA1C 5.6 03/16/2012   No results found for: VITAMINB12 Lab Results  Component Value Date   TSH 1.827 11/27/2014   No components found for: VITAMIND     ASSESSMENT AND PLAN  Obstructive apnea  Atrial fibrillation, persistent (HCC)  Arthralgia of hip, unspecified laterality  Restless leg syndrome  Urinary urgency  1.   We will set up a PSG split night study and set up an AutoPap based on the findings.   Treatment may help AFib and HTN and nocturia.    We also discussed an oral appliance and surgery.    2.  Rrestless leg syndrome/PLMS did best on the combination of gabapentin in the evening and bedtime and Valium at bedtime. Continue on those medicines and I will re-prescribe 3.  Continue Flomax.   Consider night time oxybutynin for nocturia/urgency  He will return to see me in 4 months or sooner if he has new or worsening neurologic symptoms.   Richard A. Felecia Shelling, MD, PhD 84/02/1029, 2:81 AM Certified in Neurology, Clinical Neurophysiology, Sleep Medicine, Pain Medicine and Neuroimaging  [facility] [address], [place], [place] [address] [phone]

## 2014-12-13 NOTE — Patient Instructions (Signed)
Somebody should call you to schedule a sleep study shortly. If you do not hear from anybody by the end of next week, please give [place] a call back.

## 2015-01-02 ENCOUNTER — Ambulatory Visit (INDEPENDENT_AMBULATORY_CARE_PROVIDER_SITE_OTHER): Payer: Medicare Other | Admitting: *Deleted

## 2015-01-02 ENCOUNTER — Encounter: Payer: Self-pay | Admitting: *Deleted

## 2015-01-02 ENCOUNTER — Telehealth: Payer: Self-pay | Admitting: Pharmacist

## 2015-01-02 DIAGNOSIS — I481 Persistent atrial fibrillation: Secondary | ICD-10-CM

## 2015-01-02 DIAGNOSIS — Z5181 Encounter for therapeutic drug level monitoring: Secondary | ICD-10-CM | POA: Diagnosis not present

## 2015-01-02 DIAGNOSIS — I749 Embolism and thrombosis of unspecified artery: Secondary | ICD-10-CM

## 2015-01-02 DIAGNOSIS — I4891 Unspecified atrial fibrillation: Secondary | ICD-10-CM

## 2015-01-02 DIAGNOSIS — I4819 Other persistent atrial fibrillation: Secondary | ICD-10-CM

## 2015-01-02 LAB — POCT INR: INR: 6.4

## 2015-01-02 LAB — PROTIME-INR
INR: 4.93 — ABNORMAL HIGH (ref <1.50)
Prothrombin Time: 46.7 seconds — ABNORMAL HIGH (ref 11.6–15.2)

## 2015-01-09 ENCOUNTER — Ambulatory Visit (INDEPENDENT_AMBULATORY_CARE_PROVIDER_SITE_OTHER): Payer: Medicare Other | Admitting: Pharmacist

## 2015-01-09 DIAGNOSIS — I481 Persistent atrial fibrillation: Secondary | ICD-10-CM | POA: Diagnosis not present

## 2015-01-09 DIAGNOSIS — I749 Embolism and thrombosis of unspecified artery: Secondary | ICD-10-CM | POA: Diagnosis not present

## 2015-01-09 DIAGNOSIS — I4891 Unspecified atrial fibrillation: Secondary | ICD-10-CM

## 2015-01-09 DIAGNOSIS — I4819 Other persistent atrial fibrillation: Secondary | ICD-10-CM

## 2015-01-09 DIAGNOSIS — Z5181 Encounter for therapeutic drug level monitoring: Secondary | ICD-10-CM

## 2015-01-09 LAB — POCT INR: INR: 2.2

## 2015-01-16 ENCOUNTER — Ambulatory Visit (INDEPENDENT_AMBULATORY_CARE_PROVIDER_SITE_OTHER): Payer: Medicare Other | Admitting: Neurology

## 2015-01-16 DIAGNOSIS — G4733 Obstructive sleep apnea (adult) (pediatric): Secondary | ICD-10-CM | POA: Diagnosis not present

## 2015-01-16 DIAGNOSIS — I4819 Other persistent atrial fibrillation: Secondary | ICD-10-CM

## 2015-01-16 DIAGNOSIS — G2581 Restless legs syndrome: Secondary | ICD-10-CM

## 2015-01-17 NOTE — Sleep Study (Signed)
Please see the scanned sleep study interpretation located in the procedure tab in the chart view section.  

## 2015-01-22 ENCOUNTER — Telehealth: Payer: Self-pay | Admitting: *Deleted

## 2015-01-22 DIAGNOSIS — G4733 Obstructive sleep apnea (adult) (pediatric): Secondary | ICD-10-CM

## 2015-01-22 NOTE — Telephone Encounter (Signed)
LMTC./fim 

## 2015-01-22 NOTE — Telephone Encounter (Signed)
-----   Message from Britt Bottom, MD sent at 01/22/2015  9:51 AM EST ----- That set him up with CPAP 9 cm  (split night CPAP titration in the EMR)

## 2015-01-22 NOTE — Telephone Encounter (Signed)
I have spoken with Brandon [name] this morning and per RAS, advised that he does have severe osa.  I have explained in detail what osa is and risks of untreated osa.  He verbalized understanding of same, is agreeable to trying cpap.  CPAP ordered in [facility] and staff message sent to Gracy Racer with [facility]/fim

## 2015-01-23 ENCOUNTER — Ambulatory Visit (INDEPENDENT_AMBULATORY_CARE_PROVIDER_SITE_OTHER): Payer: Medicare Other | Admitting: *Deleted

## 2015-01-23 DIAGNOSIS — I4891 Unspecified atrial fibrillation: Secondary | ICD-10-CM

## 2015-01-23 DIAGNOSIS — Z5181 Encounter for therapeutic drug level monitoring: Secondary | ICD-10-CM | POA: Diagnosis not present

## 2015-01-23 DIAGNOSIS — I749 Embolism and thrombosis of unspecified artery: Secondary | ICD-10-CM

## 2015-01-23 DIAGNOSIS — I481 Persistent atrial fibrillation: Secondary | ICD-10-CM

## 2015-01-23 DIAGNOSIS — I4819 Other persistent atrial fibrillation: Secondary | ICD-10-CM

## 2015-01-23 LAB — POCT INR: INR: 3.2

## 2015-01-31 ENCOUNTER — Other Ambulatory Visit: Payer: Self-pay | Admitting: Internal Medicine

## 2015-02-06 ENCOUNTER — Ambulatory Visit (INDEPENDENT_AMBULATORY_CARE_PROVIDER_SITE_OTHER): Payer: Medicare Other | Admitting: *Deleted

## 2015-02-06 DIAGNOSIS — I481 Persistent atrial fibrillation: Secondary | ICD-10-CM | POA: Diagnosis not present

## 2015-02-06 DIAGNOSIS — I749 Embolism and thrombosis of unspecified artery: Secondary | ICD-10-CM | POA: Diagnosis not present

## 2015-02-06 DIAGNOSIS — I4891 Unspecified atrial fibrillation: Secondary | ICD-10-CM | POA: Diagnosis not present

## 2015-02-06 DIAGNOSIS — I4819 Other persistent atrial fibrillation: Secondary | ICD-10-CM

## 2015-02-06 DIAGNOSIS — Z5181 Encounter for therapeutic drug level monitoring: Secondary | ICD-10-CM | POA: Diagnosis not present

## 2015-02-06 LAB — POCT INR: INR: 2.8

## 2015-02-18 ENCOUNTER — Telehealth: Payer: Self-pay | Admitting: Neurology

## 2015-02-18 NOTE — Telephone Encounter (Signed)
Patient called regarding CPAP, was advised by [facility] to call our office regarding denial of CPAP.

## 2015-02-18 NOTE — Telephone Encounter (Signed)
Received message back from [facility] will provide pt. with cpap.  PC with pt. and advised someone from [facility] should be contacting him soon.  He verbalized understanding of same/fim

## 2015-02-18 NOTE — Telephone Encounter (Signed)
I have Pope with [place] to see what the problem is/fim

## 2015-02-26 ENCOUNTER — Ambulatory Visit (INDEPENDENT_AMBULATORY_CARE_PROVIDER_SITE_OTHER): Payer: Medicare Other

## 2015-02-26 ENCOUNTER — Encounter (HOSPITAL_COMMUNITY): Payer: Self-pay | Admitting: Nurse Practitioner

## 2015-02-26 ENCOUNTER — Ambulatory Visit (HOSPITAL_COMMUNITY)
Admission: RE | Admit: 2015-02-26 | Discharge: 2015-02-26 | Disposition: A | Discharge status: Home / Self Care | Payer: Medicare Other | Source: Ambulatory Visit | Attending: Nurse Practitioner | Admitting: Nurse Practitioner

## 2015-02-26 VITALS — BP 120/76 | HR 133 | Ht 76.0 in | Wt 268.2 lb

## 2015-02-26 DIAGNOSIS — I4891 Unspecified atrial fibrillation: Secondary | ICD-10-CM

## 2015-02-26 DIAGNOSIS — E669 Obesity, unspecified: Secondary | ICD-10-CM | POA: Diagnosis not present

## 2015-02-26 DIAGNOSIS — Z888 Allergy status to other drugs, medicaments and biological substances status: Secondary | ICD-10-CM | POA: Insufficient documentation

## 2015-02-26 DIAGNOSIS — Z5181 Encounter for therapeutic drug level monitoring: Secondary | ICD-10-CM | POA: Diagnosis not present

## 2015-02-26 DIAGNOSIS — G2581 Restless legs syndrome: Secondary | ICD-10-CM | POA: Diagnosis not present

## 2015-02-26 DIAGNOSIS — I749 Embolism and thrombosis of unspecified artery: Secondary | ICD-10-CM

## 2015-02-26 DIAGNOSIS — Z79899 Other long term (current) drug therapy: Secondary | ICD-10-CM | POA: Insufficient documentation

## 2015-02-26 DIAGNOSIS — Z7901 Long term (current) use of anticoagulants: Secondary | ICD-10-CM | POA: Diagnosis not present

## 2015-02-26 DIAGNOSIS — G4761 Periodic limb movement disorder: Secondary | ICD-10-CM | POA: Insufficient documentation

## 2015-02-26 DIAGNOSIS — I480 Paroxysmal atrial fibrillation: Secondary | ICD-10-CM | POA: Diagnosis not present

## 2015-02-26 DIAGNOSIS — M1990 Unspecified osteoarthritis, unspecified site: Secondary | ICD-10-CM | POA: Insufficient documentation

## 2015-02-26 DIAGNOSIS — Z86711 Personal history of pulmonary embolism: Secondary | ICD-10-CM | POA: Diagnosis not present

## 2015-02-26 DIAGNOSIS — E785 Hyperlipidemia, unspecified: Secondary | ICD-10-CM | POA: Diagnosis not present

## 2015-02-26 DIAGNOSIS — G4733 Obstructive sleep apnea (adult) (pediatric): Secondary | ICD-10-CM | POA: Insufficient documentation

## 2015-02-26 DIAGNOSIS — I10 Essential (primary) hypertension: Secondary | ICD-10-CM | POA: Diagnosis not present

## 2015-02-26 DIAGNOSIS — Z87891 Personal history of nicotine dependence: Secondary | ICD-10-CM | POA: Diagnosis not present

## 2015-02-26 DIAGNOSIS — F419 Anxiety disorder, unspecified: Secondary | ICD-10-CM | POA: Insufficient documentation

## 2015-02-26 DIAGNOSIS — Z6832 Body mass index (BMI) 32.0-32.9, adult: Secondary | ICD-10-CM | POA: Diagnosis not present

## 2015-02-26 LAB — POCT INR: INR: 4.1

## 2015-02-26 NOTE — Patient Instructions (Signed)
Check BP/HR in the later today and in the morning -- if Heart rate is still above 100 - call office in the morning. [phone]

## 2015-02-26 NOTE — Progress Notes (Signed)
Patient ID: Brandon Patricia., male   DOB: 05-29-1950, 65 y.o.   MRN: [id number]     Primary Care Physician: Nilda Simmer, MD Referring Physician: Dr. Raynaldo Opitz Vandezande [name] is a 65 y.o. male with a h/o PAF on amiodarone for f/u. He reports that he is feeling well this am but EKG shows afib with rvr from which he is completely asymptomatic. No change is medcine or health status.   Today, he denies symptoms of palpitations, chest pain, shortness of breath, orthopnea, PND, lower extremity edema, dizziness, presyncope, syncope, or neurologic sequela. The patient is tolerating medications without difficulties and is otherwise without complaint today.   Past Medical History  Diagnosis Date  . Arthritis   . Sciatica   . Anxiety   . Hypertension   . Osteoporosis   . Cataract   . Asthma     Patient and wife both dinies patient has or had asthma  . Enlarged prostate   . Left atrial enlargement   . Persistent atrial fibrillation ([place])   . Obesity   . Bilateral pulmonary embolism ([place]) 3/14    admitted to [facility],  treated with Xarelto  . Chronic pain   . Thrombus of left atrial appendage 03/09/2013  . Mitral regurgitation 04/24/2013    Mild by TEE  . Fatigue   . Dyspnea on exertion   . Polydipsia   . Anxiety disorder   . Benign neoplasm of colon   . Benign prostatic hyperplasia with urinary obstruction   . Chest pain   . Encounter for long-term (current) use of other medications   . Hyperlipemia   . Hypopotassemia   . Insomnia   . Major depressive disorder, recurrent episode ([place])   . Osteoarthritis   . Periodic limb movement disorder   . SOB (shortness of breath)   . Thoracic or lumbosacral neuritis or radiculitis, unspecified   . Ventral hernia, unspecified, without mention of obstruction or gangrene     right abdominal wall  . Restless leg syndrome     takes gabapentin  . Dysrhythmia     afib   . OSA (obstructive sleep apnea)     noncompliant with CPAP.  07/27/13- awaiting a  CPAP,  . Obstructive sleep apnea    Past Surgical History  Procedure Laterality Date  . Knee arthroscopy      left  . Colonoscopy w/ polypectomy    . Rotator cuff repair      right  . Tonsillectomy    . Cataract extraction    . Hernia repair  07/06/2011  . Replacement total knee Left   . Tee without cardioversion N/A 03/21/2012    Procedure: TRANSESOPHAGEAL ECHOCARDIOGRAM (TEE);  Surgeon: Birdie Riddle, MD;  Location: [place];  Service: Cardiovascular;  Laterality: N/A;  . Cardioversion N/A 03/21/2012    Procedure: CARDIOVERSION;  Surgeon: Birdie Riddle, MD;  Location: [facility] ENDOSCOPY;  Service: Cardiovascular;  Laterality: N/A;  . Tee without cardioversion N/A 04/24/2013    Procedure: TRANSESOPHAGEAL ECHOCARDIOGRAM (TEE);  Surgeon: Dorothy Spark, MD;  Location: [place];  Service: Cardiovascular;  Laterality: N/A;  . Cardioversion N/A 04/24/2013    Procedure: CARDIOVERSION;  Surgeon: Dorothy Spark, MD;  Location: [place];  Service: Cardiovascular;  Laterality: N/A;  . Eye surgery Right     cataract  . Femur fracture surgery    . Inguinal hernia repair Right 07/28/2013    Procedure: RIGHT INGUINAL HERNIA REPAIR;  Surgeon: Imogene Burn. Tsuei,  MD;  Location: [place];  Service: General;  Laterality: Right;  . Insertion of mesh Right 07/28/2013    Procedure: INSERTION OF MESH;  Surgeon: Imogene Burn. Georgette Dover, MD;  Location: [place];  Service: General;  Laterality: Right;  . Rotator cuff repair Left 03/08/2014    DR SUPPLE  . Shoulder arthroscopy with rotator cuff repair and subacromial decompression Left 03/08/2014    Procedure: LEFT SHOULDER ARTHROSCOPY WITH ROTATOR CUFF REPAIR/SUBACROMIAL DECOMPRESSION/DISTAL CLAVICLE RESECTION;  Surgeon: Marin Shutter, MD;  Location: [place];  Service: Orthopedics;  Laterality: Left;    Current Outpatient Prescriptions  Medication Sig Dispense Refill  . amiodarone (PACERONE) 200 MG tablet Take 0.5 tablets (100 mg total) by mouth daily. 30 tablet 3    . amLODipine (NORVASC) 10 MG tablet Take 10 mg by mouth daily.    . cyclobenzaprine (FLEXERIL) 5 MG tablet Take 1 tablet (5 mg total) by mouth every 8 (eight) hours as needed for muscle spasms. 90 tablet 2  . escitalopram (LEXAPRO) 20 MG tablet Take 20 mg by mouth daily.    Marland Kitchen gabapentin (NEURONTIN) 800 MG tablet Take 1 tablet (800 mg total) by mouth See admin instructions. Takes 1 tablet by mouth daily with supper, and take 1 tablet at bedtime as needed 60 tablet 11  . lidocaine (LIDODERM) 5 % Place 1 patch onto the skin daily as needed (knee pain).     Marland Kitchen losartan (COZAAR) 100 MG tablet Take 100 mg by mouth daily.    . tamsulosin (FLOMAX) 0.4 MG CAPS capsule Take 0.4 mg by mouth 2 (two) times daily.     Marland Kitchen warfarin (COUMADIN) 5 MG tablet TAKE AS DIRECTED BY COUMADIN CLINIC 30 tablet 3   No current facility-administered medications for this encounter.    Allergies  Allergen Reactions  . Ace Inhibitors Cough    Social History   Social History  . Marital Status: Married    Spouse Name: N/A  . Number of Children: N/A  . Years of Education: N/A   Occupational History  . Not on file.   Social History Main Topics  . Smoking status: Former Research scientist (life sciences)  . Smokeless tobacco: Never Used  . Alcohol Use: 0.0 oz/week    0 Standard drinks or equivalent per week     Comment: 2 drinks per night  . Drug Use: No     Comment: negative hx for IV drug abuse  . Sexual Activity: Not on file   Other Topics Concern  . Not on file   Social History Narrative   Lives with wife in [place]   Retired Dietitian    Family History  Problem Relation Age of Onset  . Cancer Father     bone  . Cancer Paternal Grandmother     ovarian  . CVA Other     Fam Hx of multiple myeloma  . Diabetes Other     Fam Hx of DM    ROS- All systems are reviewed and negative except as per the HPI above  Physical Exam: Filed Vitals:   02/26/15 0842  BP: 120/76  Pulse: 133  Height: 6' 4"  (1.93  m)  Weight: 268 lb 3.2 oz (121.655 kg)    GEN- The patient is well appearing, alert and oriented x 3 today.   Head- normocephalic, atraumatic Eyes-  Sclera clear, conjunctiva pink Ears- hearing intact Oropharynx- clear Neck- supple, no JVP Lymph- no cervical lymphadenopathy Lungs- Clear to ausculation bilaterally, normal work of  breathing Heart- Rapid, irregular rate and rhythm, no murmurs, rubs or gallops, PMI not laterally displaced GI- soft, NT, ND, + BS Extremities- no clubbing, cyanosis, or edema MS- no significant deformity or atrophy Skin- no rash or lesion Psych- euthymic mood, full affect Neuro- strength and sensation are intact  EKG- Afib with v rate of 133 bpm, qrs int 116 ms, qtc 523 ms Epic records reviewed  Assessment and Plan: 1.afib On amiodarone 100 mg qd but today in asymptomatic afib He will recheck his HR this pm and in am and if afib persists, he will inform the office and meds will be adjusted. TSH/liver/breathing tests all performed 3 months ago and wnl.   2. HTN Stable   F/u as needed and or in 3 months.  Brandon [name], [facility] [address] [place], [place] [address] [phone]

## 2015-03-07 NOTE — Telephone Encounter (Signed)
Error

## 2015-03-19 ENCOUNTER — Ambulatory Visit (INDEPENDENT_AMBULATORY_CARE_PROVIDER_SITE_OTHER): Payer: Medicare Other

## 2015-03-19 DIAGNOSIS — I4891 Unspecified atrial fibrillation: Secondary | ICD-10-CM | POA: Diagnosis not present

## 2015-03-19 DIAGNOSIS — I749 Embolism and thrombosis of unspecified artery: Secondary | ICD-10-CM

## 2015-03-19 DIAGNOSIS — Z5181 Encounter for therapeutic drug level monitoring: Secondary | ICD-10-CM

## 2015-03-19 LAB — POCT INR: INR: 4.8

## 2015-04-02 ENCOUNTER — Ambulatory Visit (INDEPENDENT_AMBULATORY_CARE_PROVIDER_SITE_OTHER): Payer: Medicare Other | Admitting: *Deleted

## 2015-04-02 DIAGNOSIS — I4891 Unspecified atrial fibrillation: Secondary | ICD-10-CM | POA: Diagnosis not present

## 2015-04-02 DIAGNOSIS — I749 Embolism and thrombosis of unspecified artery: Secondary | ICD-10-CM

## 2015-04-02 DIAGNOSIS — Z5181 Encounter for therapeutic drug level monitoring: Secondary | ICD-10-CM | POA: Diagnosis not present

## 2015-04-02 LAB — POCT INR: INR: 3.2

## 2015-04-16 ENCOUNTER — Ambulatory Visit (INDEPENDENT_AMBULATORY_CARE_PROVIDER_SITE_OTHER): Payer: Medicare Other | Admitting: Surgery

## 2015-04-16 DIAGNOSIS — I480 Paroxysmal atrial fibrillation: Secondary | ICD-10-CM

## 2015-04-16 DIAGNOSIS — Z5181 Encounter for therapeutic drug level monitoring: Secondary | ICD-10-CM

## 2015-04-16 LAB — POCT INR: INR: 2.9

## 2015-04-22 ENCOUNTER — Encounter: Payer: Self-pay | Admitting: Nurse Practitioner

## 2015-05-07 ENCOUNTER — Ambulatory Visit (INDEPENDENT_AMBULATORY_CARE_PROVIDER_SITE_OTHER): Payer: Medicare Other

## 2015-05-07 DIAGNOSIS — Z5181 Encounter for therapeutic drug level monitoring: Secondary | ICD-10-CM | POA: Diagnosis not present

## 2015-05-07 DIAGNOSIS — I480 Paroxysmal atrial fibrillation: Secondary | ICD-10-CM

## 2015-05-07 LAB — POCT INR: INR: 3.9

## 2015-05-21 ENCOUNTER — Ambulatory Visit (INDEPENDENT_AMBULATORY_CARE_PROVIDER_SITE_OTHER): Payer: Medicare Other | Admitting: *Deleted

## 2015-05-21 DIAGNOSIS — I480 Paroxysmal atrial fibrillation: Secondary | ICD-10-CM | POA: Diagnosis not present

## 2015-05-21 DIAGNOSIS — Z5181 Encounter for therapeutic drug level monitoring: Secondary | ICD-10-CM

## 2015-05-21 LAB — POCT INR: INR: 4.5

## 2015-06-05 ENCOUNTER — Encounter: Payer: Self-pay | Admitting: Internal Medicine

## 2015-06-05 ENCOUNTER — Ambulatory Visit (INDEPENDENT_AMBULATORY_CARE_PROVIDER_SITE_OTHER): Payer: Medicare Other | Admitting: *Deleted

## 2015-06-05 ENCOUNTER — Ambulatory Visit (INDEPENDENT_AMBULATORY_CARE_PROVIDER_SITE_OTHER): Payer: Medicare Other | Admitting: Internal Medicine

## 2015-06-05 VITALS — BP 98/72 | HR 111 | Ht 76.0 in | Wt 212.0 lb

## 2015-06-05 DIAGNOSIS — Z5181 Encounter for therapeutic drug level monitoring: Secondary | ICD-10-CM | POA: Diagnosis not present

## 2015-06-05 DIAGNOSIS — I10 Essential (primary) hypertension: Secondary | ICD-10-CM | POA: Diagnosis not present

## 2015-06-05 DIAGNOSIS — I480 Paroxysmal atrial fibrillation: Secondary | ICD-10-CM

## 2015-06-05 DIAGNOSIS — I4891 Unspecified atrial fibrillation: Secondary | ICD-10-CM | POA: Diagnosis not present

## 2015-06-05 DIAGNOSIS — Z79899 Other long term (current) drug therapy: Secondary | ICD-10-CM

## 2015-06-05 DIAGNOSIS — G4733 Obstructive sleep apnea (adult) (pediatric): Secondary | ICD-10-CM

## 2015-06-05 DIAGNOSIS — I481 Persistent atrial fibrillation: Secondary | ICD-10-CM

## 2015-06-05 DIAGNOSIS — I4819 Other persistent atrial fibrillation: Secondary | ICD-10-CM

## 2015-06-05 LAB — T4, FREE: Free T4: 1.2 ng/dL (ref 0.8–1.8)

## 2015-06-05 LAB — HEPATIC FUNCTION PANEL
ALT: 13 U/L (ref 9–46)
AST: 15 U/L (ref 10–35)
Albumin: 4.3 g/dL (ref 3.6–5.1)
Alkaline Phosphatase: 64 U/L (ref 40–115)
Bilirubin, Direct: 0.2 mg/dL (ref <=0.2)
Indirect Bilirubin: 0.5 mg/dL (ref 0.2–1.2)
Total Bilirubin: 0.7 mg/dL (ref 0.2–1.2)
Total Protein: 6.5 g/dL (ref 6.1–8.1)

## 2015-06-05 LAB — TSH: TSH: 2.07 mIU/L (ref 0.40–4.50)

## 2015-06-05 LAB — POCT INR: INR: 3.3

## 2015-06-05 MED ORDER — AMIODARONE HCL 200 MG PO TABS
200.0000 mg | ORAL_TABLET | Freq: Two times a day (BID) | ORAL | Status: DC
Start: 2015-06-05 — End: 2015-07-03

## 2015-06-05 NOTE — Patient Instructions (Addendum)
Medication Instructions:  Your physician has recommended you make the following change in your medication:  1) INCREASE Amiodarone to 200 mg twice a day  Labwork: Today: Liver function test, TSH and T4  Testing/Procedures: Your physician has requested that you have an echocardiogram in 4 weeks. Echocardiography is a painless test that uses sound waves to create images of your heart. It provides your doctor with information about the size and shape of your heart and how well your heart's chambers and valves are working. This procedure takes approximately one hour. There are no restrictions for this procedure.  Your physician has recommended that you have a Cardioversion (DCCV) in 3 weeks. Electrical Cardioversion uses a jolt of electricity to your heart either through paddles or wired patches attached to your chest. This is a controlled, usually prescheduled, procedure. Defibrillation is done under light anesthesia in the hospital, and you usually go home the day of the procedure. This is done to get your heart back into a normal rhythm. You are not awake for the procedure.   We will call you to arrange this procedure  Follow-Up: You will need weekly INR checks for your upcoming cardioversion procedure.  Your physician recommends that you schedule a follow-up appointment in: 4 weeks with Dr. Rayann Heman.  If you need a refill on your cardiac medications before your next appointment, please call your pharmacy.  Thank you for choosing [facility]!!   Trinidad Curet, RN [phone]   Any Other Special Instructions Will Be Listed Below (If Applicable). Electrical Cardioversion Electrical cardioversion is the delivery of a jolt of electricity to change the rhythm of the heart. Sticky patches or metal paddles are placed on the chest to deliver the electricity from a device. This is done to restore a normal rhythm. A rhythm that is too fast or not regular keeps the heart from pumping  well. Electrical cardioversion is done in an emergency if:   There is low or no blood pressure as a result of the heart rhythm.   Normal rhythm must be restored as fast as possible to protect the brain and heart from further damage.   It may save a life. Cardioversion may be done for heart rhythms that are not immediately life threatening, such as atrial fibrillation or flutter, in which:   The heart is beating too fast or is not regular.   Medicine to change the rhythm has not worked.   It is safe to wait in order to allow time for preparation.  Symptoms of the abnormal rhythm are bothersome.  The risk of stroke and other serious problems can be reduced. LET [facility] CARE PROVIDER KNOW ABOUT:   Any allergies you have.  All medicines you are taking, including vitamins, herbs, eye drops, creams, and over-the-counter medicines.  Previous problems you or members of your family have had with the use of anesthetics.   Any blood disorders you have.   Previous surgeries you have had.   Medical conditions you have. RISKS AND COMPLICATIONS  Generally, this is a safe procedure. However, problems can occur and include:   Breathing problems related to the anesthetic used.  A blood clot that breaks free and travels to other parts of your body. This could cause a stroke or other problems. The risk of this is lowered by use of blood-thinning medicine (anticoagulant) prior to the procedure.  Cardiac arrest (rare). BEFORE THE PROCEDURE   You may have tests to detect blood clots in your heart and to  evaluate heart function.  You may start taking anticoagulants so your blood does not clot as easily.   Medicines may be given to help stabilize your heart rate and rhythm. PROCEDURE  You will be given medicine through an IV tube to reduce discomfort and make you sleepy (sedative).   An electrical shock will be delivered. AFTER THE PROCEDURE Your heart rhythm will be watched  to make sure it does not change.    This information is not intended to replace advice given to you by your health care provider. Make sure you discuss any questions you have with your health care provider.   Document Released: 12/12/2001 Document Revised: 01/12/2014 Document Reviewed: 07/06/2012 Elsevier Interactive Patient Education [employer].

## 2015-06-05 NOTE — Addendum Note (Signed)
Addended by: Stanton Kidney on: 06/05/2015 10:31 AM   Modules accepted: Orders

## 2015-06-05 NOTE — Progress Notes (Signed)
Electrophysiology Office Note   Date:  06/05/2015   ID:  Brandon Cacal., DOB 1950/10/16, MRN [id number]  PCP:  Nilda Simmer, MD   Primary Electrophysiologist: Thompson Grayer, MD    Chief Complaint  Patient presents with  . Atrial Fibrillation     History of Present Illness: Brandon [name]. is a 65 y.o. male who presents today for electrophysiology evaluation.  He appears to have returned to afib since at least February.  Mostly unaware.   Rare palpitations.   Reports compliance with medicines.  Finds heart rates at home to be mostly < 100 bpm though he has recently not been as diligent in checking this.  Today, he denies symptoms of chest pain, shortness of breath, orthopnea, PND, lower extremity edema, claudication, dizziness, presyncope, syncope, bleeding, or neurologic sequela. The patient is tolerating medications without difficulties and is otherwise without complaint today.    Past Medical History  Diagnosis Date  . Arthritis   . Sciatica   . Anxiety   . Hypertension   . Osteoporosis   . Cataract   . Persistent atrial fibrillation ([place])   . Obesity   . Bilateral pulmonary embolism ([place]) 3/14    admitted to [facility],  treated with Xarelto  . Chronic pain   . Thrombus of left atrial appendage 03/09/2013  . Mitral regurgitation 04/24/2013    Mild by TEE  . Benign neoplasm of colon   . Benign prostatic hyperplasia with urinary obstruction   . Hyperlipemia   . Insomnia   . Major depressive disorder, recurrent episode ([place])   . Ventral hernia, unspecified, without mention of obstruction or gangrene     right abdominal wall  . Restless leg syndrome     takes gabapentin  . OSA (obstructive sleep apnea)     noncompliant with CPAP.  07/27/13- awaiting a CPAP,   Past Surgical History  Procedure Laterality Date  . Knee arthroscopy      left  . Colonoscopy w/ polypectomy    . Rotator cuff repair      right  . Tonsillectomy    . Cataract extraction    . Hernia repair   07/06/2011  . Replacement total knee Left   . Tee without cardioversion N/A 03/21/2012    Procedure: TRANSESOPHAGEAL ECHOCARDIOGRAM (TEE);  Surgeon: Birdie Riddle, MD;  Location: [place];  Service: Cardiovascular;  Laterality: N/A;  . Cardioversion N/A 03/21/2012    Procedure: CARDIOVERSION;  Surgeon: Birdie Riddle, MD;  Location: [facility] ENDOSCOPY;  Service: Cardiovascular;  Laterality: N/A;  . Tee without cardioversion N/A 04/24/2013    Procedure: TRANSESOPHAGEAL ECHOCARDIOGRAM (TEE);  Surgeon: Dorothy Spark, MD;  Location: [place];  Service: Cardiovascular;  Laterality: N/A;  . Cardioversion N/A 04/24/2013    Procedure: CARDIOVERSION;  Surgeon: Dorothy Spark, MD;  Location: [place];  Service: Cardiovascular;  Laterality: N/A;  . Eye surgery Right     cataract  . Femur fracture surgery    . Inguinal hernia repair Right 07/28/2013    Procedure: RIGHT INGUINAL HERNIA REPAIR;  Surgeon: Imogene Burn. Georgette Dover, MD;  Location: [place];  Service: General;  Laterality: Right;  . Insertion of mesh Right 07/28/2013    Procedure: INSERTION OF MESH;  Surgeon: Imogene Burn. Georgette Dover, MD;  Location: [place];  Service: General;  Laterality: Right;  . Rotator cuff repair Left 03/08/2014    DR SUPPLE  . Shoulder arthroscopy with rotator cuff repair and subacromial decompression Left 03/08/2014  Procedure: LEFT SHOULDER ARTHROSCOPY WITH ROTATOR CUFF REPAIR/SUBACROMIAL DECOMPRESSION/DISTAL CLAVICLE RESECTION;  Surgeon: Marin Shutter, MD;  Location: [place];  Service: Orthopedics;  Laterality: Left;     Current Outpatient Prescriptions  Medication Sig Dispense Refill  . amiodarone (PACERONE) 200 MG tablet Take 0.5 tablets (100 mg total) by mouth daily. 30 tablet 3  . amLODipine (NORVASC) 10 MG tablet Take 10 mg by mouth daily.    . cyclobenzaprine (FLEXERIL) 5 MG tablet Take 1 tablet (5 mg total) by mouth every 8 (eight) hours as needed for muscle spasms. 90 tablet 2  . escitalopram (LEXAPRO) 20 MG tablet Take  20 mg by mouth daily.    Marland Kitchen gabapentin (NEURONTIN) 800 MG tablet Take 1 tablet (800 mg total) by mouth See admin instructions. Takes 1 tablet by mouth daily with supper, and take 1 tablet at bedtime as needed 60 tablet 11  . lidocaine (LIDODERM) 5 % Place 1 patch onto the skin daily as needed (knee pain).     Marland Kitchen losartan (COZAAR) 100 MG tablet Take 100 mg by mouth daily.    . tamsulosin (FLOMAX) 0.4 MG CAPS capsule Take 0.4 mg by mouth 2 (two) times daily.     Marland Kitchen warfarin (COUMADIN) 5 MG tablet TAKE AS DIRECTED BY COUMADIN CLINIC 30 tablet 3   No current facility-administered medications for this visit.    Allergies:   Ace inhibitors   Social History:  The patient  reports that he has quit smoking. He has never used smokeless tobacco. He reports that he drinks alcohol. He reports that he does not use illicit drugs.   Family History:  The patient's  family history includes CVA in his other; Cancer in his father and paternal grandmother; Diabetes in his other.    ROS:  Please see the history of present illness.   All other systems are reviewed and negative.    PHYSICAL EXAM: VS:  BP 98/72 mmHg  Pulse 111  Ht 6\' 4"  (1.93 m)  Wt 212 lb (96.163 kg)  BMI 25.82 kg/m2  SpO2 95% , BMI Body mass index is 25.82 kg/(m^2). GEN: Well nourished, well developed, in no acute distress HEENT: normal Neck: no JVD, carotid bruits, or masses Cardiac: tachycardic irregular rhythm; no murmurs, rubs, or gallops,no edema  Respiratory:  clear to auscultation bilaterally, normal work of breathing GI: soft, nontender, nondistended, + BS MS: no deformity or atrophy Skin: warm and dry  Neuro:  Strength and sensation are intact Psych: euthymic mood, full affect  EKG:  EKG is ordered today. The ekg ordered today shows afib, V rate 110 bpm   Recent Labs: 10/08/2014: BUN 20; Creatinine, Ser 0.85; Hemoglobin 13.7; Platelets 149*; Potassium 3.0*; Sodium 139 11/27/2014: ALT 19; TSH 1.827    Lipid Panel       Component Value Date/Time   CHOL 170 03/17/2012 0432   TRIG 117 03/17/2012 0432   HDL 30* 03/17/2012 0432   CHOLHDL 5.7 03/17/2012 0432   VLDL 23 03/17/2012 0432   LDLCALC 117* 03/17/2012 0432     Wt Readings from Last 3 Encounters:  06/05/15 212 lb (96.163 kg)  02/26/15 268 lb 3.2 oz (121.655 kg)  12/13/14 279 lb 9.6 oz (126.826 kg)    Other studies Reviewed: Additional studies/ records that were reviewed today include: echo 2015   Review of the above records today demonstrates: preserved EF   ASSESSMENT AND PLAN:  1.  Persistent afib Returned to afib though mostly unaware Anticoagulated with coumadin Will increase  amiodarone to 200mg  BID.  Will alert anticoagulation clinic so that coumadin can be adjusted.  Weekly INRs Cardioversion after 3 weeks of increased amiodarone.  Risks of procedure discussed with patient who wishes to proceed. Return to see me in 4 weeks Check lfts, tfts Repeat echo  2. HTN Stable No change required today  3. OSA Stable No change required today   Current medicines are reviewed at length with the patient today.   The patient does not have concerns regarding his medicines.  The following changes were made today:  none  Return to see me in 4 weeks (1 week post cardioversion) with an echo  Signed, Thompson Grayer, MD  06/05/2015 10:16 AM     [facility], [facility] [place] [address] [phone] (office) [phone] (fax)

## 2015-06-14 ENCOUNTER — Ambulatory Visit (INDEPENDENT_AMBULATORY_CARE_PROVIDER_SITE_OTHER): Payer: Medicare Other | Admitting: *Deleted

## 2015-06-14 DIAGNOSIS — I481 Persistent atrial fibrillation: Secondary | ICD-10-CM | POA: Diagnosis not present

## 2015-06-14 DIAGNOSIS — I4819 Other persistent atrial fibrillation: Secondary | ICD-10-CM

## 2015-06-14 DIAGNOSIS — Z5181 Encounter for therapeutic drug level monitoring: Secondary | ICD-10-CM

## 2015-06-14 DIAGNOSIS — I480 Paroxysmal atrial fibrillation: Secondary | ICD-10-CM

## 2015-06-14 LAB — POCT INR: INR: 2.8

## 2015-06-17 DIAGNOSIS — Z7901 Long term (current) use of anticoagulants: Secondary | ICD-10-CM | POA: Diagnosis not present

## 2015-06-17 DIAGNOSIS — Z86010 Personal history of colonic polyps: Secondary | ICD-10-CM | POA: Diagnosis not present

## 2015-06-17 DIAGNOSIS — Z1211 Encounter for screening for malignant neoplasm of colon: Secondary | ICD-10-CM | POA: Diagnosis not present

## 2015-06-17 DIAGNOSIS — I4891 Unspecified atrial fibrillation: Secondary | ICD-10-CM | POA: Diagnosis not present

## 2015-06-18 ENCOUNTER — Telehealth: Payer: Self-pay | Admitting: Pharmacist

## 2015-06-18 NOTE — Telephone Encounter (Signed)
Received fax from [facility] that pt needs a colonoscopy, date not scheduled yet. Request to hold Coumadin x5 days. Pt currently having weekly INRs checked for cardioversion scheduled on 6/22. Will need uninterrupted Coumadin therapy for 4 weeks minimum post-cardioversion. Earliest colonoscopy can be scheduled is 8/1 d/t upcoming cardioversion.   Pt also has hx of multiple clots and will need Lovenox bridge once his colonoscopy is scheduled. Pt has done Lovenox injections multiple times before. Clearance faxed back to [place] fax [phone]. Will have pt update [place] when colonoscopy is scheduled.

## 2015-06-20 ENCOUNTER — Telehealth: Payer: Self-pay | Admitting: *Deleted

## 2015-06-20 ENCOUNTER — Ambulatory Visit (INDEPENDENT_AMBULATORY_CARE_PROVIDER_SITE_OTHER): Payer: Medicare Other | Admitting: *Deleted

## 2015-06-20 DIAGNOSIS — Z5181 Encounter for therapeutic drug level monitoring: Secondary | ICD-10-CM | POA: Diagnosis not present

## 2015-06-20 DIAGNOSIS — I480 Paroxysmal atrial fibrillation: Secondary | ICD-10-CM | POA: Diagnosis not present

## 2015-06-20 DIAGNOSIS — I4819 Other persistent atrial fibrillation: Secondary | ICD-10-CM

## 2015-06-20 DIAGNOSIS — Z01812 Encounter for preprocedural laboratory examination: Secondary | ICD-10-CM

## 2015-06-20 DIAGNOSIS — I481 Persistent atrial fibrillation: Secondary | ICD-10-CM | POA: Diagnosis not present

## 2015-06-20 LAB — POCT INR: INR: 3

## 2015-06-20 NOTE — Telephone Encounter (Signed)
lmtcb  to review DCCV instructions 

## 2015-06-21 NOTE — Telephone Encounter (Signed)
DCCV scheduled for 6/22. Patient aware to arrive at [facility] hospital at 11:30 a.m. Instructed may have clear liquid/light breakfast before 6 a.m. Pre procedure blood work to be done on 6/21 Patient verbalized understanding and agreeable to plan.

## 2015-06-26 ENCOUNTER — Other Ambulatory Visit: Payer: Self-pay

## 2015-06-27 ENCOUNTER — Ambulatory Visit (HOSPITAL_COMMUNITY)
Admission: RE | Admit: 2015-06-27 | Discharge: 2015-06-27 | Disposition: A | Discharge status: Home / Self Care | Payer: Medicare Other | Source: Ambulatory Visit | Attending: Cardiology | Admitting: Cardiology

## 2015-06-27 ENCOUNTER — Encounter (HOSPITAL_COMMUNITY)
Admission: RE | Disposition: A | Discharge status: Home / Self Care | Payer: Self-pay | Source: Ambulatory Visit | Attending: Cardiology

## 2015-06-27 ENCOUNTER — Ambulatory Visit (HOSPITAL_COMMUNITY): Payer: Medicare Other | Admitting: Anesthesiology

## 2015-06-27 ENCOUNTER — Ambulatory Visit (INDEPENDENT_AMBULATORY_CARE_PROVIDER_SITE_OTHER): Payer: Medicare Other | Admitting: Pharmacist

## 2015-06-27 ENCOUNTER — Other Ambulatory Visit (INDEPENDENT_AMBULATORY_CARE_PROVIDER_SITE_OTHER): Payer: Medicare Other | Admitting: *Deleted

## 2015-06-27 ENCOUNTER — Encounter (HOSPITAL_COMMUNITY): Payer: Self-pay

## 2015-06-27 DIAGNOSIS — I4819 Other persistent atrial fibrillation: Secondary | ICD-10-CM

## 2015-06-27 DIAGNOSIS — G4733 Obstructive sleep apnea (adult) (pediatric): Secondary | ICD-10-CM | POA: Diagnosis not present

## 2015-06-27 DIAGNOSIS — Z87891 Personal history of nicotine dependence: Secondary | ICD-10-CM | POA: Insufficient documentation

## 2015-06-27 DIAGNOSIS — I480 Paroxysmal atrial fibrillation: Secondary | ICD-10-CM

## 2015-06-27 DIAGNOSIS — E669 Obesity, unspecified: Secondary | ICD-10-CM | POA: Diagnosis not present

## 2015-06-27 DIAGNOSIS — I10 Essential (primary) hypertension: Secondary | ICD-10-CM | POA: Diagnosis not present

## 2015-06-27 DIAGNOSIS — E785 Hyperlipidemia, unspecified: Secondary | ICD-10-CM | POA: Diagnosis not present

## 2015-06-27 DIAGNOSIS — G8929 Other chronic pain: Secondary | ICD-10-CM | POA: Diagnosis not present

## 2015-06-27 DIAGNOSIS — M1990 Unspecified osteoarthritis, unspecified site: Secondary | ICD-10-CM | POA: Insufficient documentation

## 2015-06-27 DIAGNOSIS — Z5181 Encounter for therapeutic drug level monitoring: Secondary | ICD-10-CM | POA: Diagnosis not present

## 2015-06-27 DIAGNOSIS — Z7901 Long term (current) use of anticoagulants: Secondary | ICD-10-CM | POA: Insufficient documentation

## 2015-06-27 DIAGNOSIS — Z79899 Other long term (current) drug therapy: Secondary | ICD-10-CM | POA: Insufficient documentation

## 2015-06-27 DIAGNOSIS — I4891 Unspecified atrial fibrillation: Secondary | ICD-10-CM | POA: Diagnosis not present

## 2015-06-27 DIAGNOSIS — I481 Persistent atrial fibrillation: Secondary | ICD-10-CM | POA: Insufficient documentation

## 2015-06-27 HISTORY — PX: CARDIOVERSION: SHX1299

## 2015-06-27 LAB — CBC WITH DIFFERENTIAL/PLATELET
Basophils Absolute: 0 10*3/uL (ref 0.0–0.1)
Basophils Relative: 1 %
Eosinophils Absolute: 0.4 10*3/uL (ref 0.0–0.7)
Eosinophils Relative: 6 %
HCT: 42.2 % (ref 39.0–52.0)
Hemoglobin: 14.1 g/dL (ref 13.0–17.0)
Lymphocytes Relative: 21 %
Lymphs Abs: 1.3 10*3/uL (ref 0.7–4.0)
MCH: 30.4 pg (ref 26.0–34.0)
MCHC: 33.4 g/dL (ref 30.0–36.0)
MCV: 90.9 fL (ref 78.0–100.0)
Monocytes Absolute: 0.7 10*3/uL (ref 0.1–1.0)
Monocytes Relative: 10 %
Neutro Abs: 3.9 10*3/uL (ref 1.7–7.7)
Neutrophils Relative %: 62 %
Platelets: 157 10*3/uL (ref 150–400)
RBC: 4.64 MIL/uL (ref 4.22–5.81)
RDW: 14.1 % (ref 11.5–15.5)
WBC: 6.3 10*3/uL (ref 4.0–10.5)

## 2015-06-27 LAB — BASIC METABOLIC PANEL
Anion gap: 7 (ref 5–15)
BUN: 20 mg/dL (ref 6–20)
CO2: 32 mmol/L (ref 22–32)
Calcium: 9.1 mg/dL (ref 8.9–10.3)
Chloride: 102 mmol/L (ref 101–111)
Creatinine, Ser: 1.08 mg/dL (ref 0.61–1.24)
GFR calc Af Amer: >60 mL/min (ref >60)
GFR calc non Af Amer: >60 mL/min (ref >60)
Glucose, Bld: 102 mg/dL — ABNORMAL HIGH (ref 65–99)
Potassium: 3 mmol/L — ABNORMAL LOW (ref 3.5–5.1)
Sodium: 141 mmol/L (ref 135–145)

## 2015-06-27 LAB — POCT INR: INR: 3.7

## 2015-06-27 SURGERY — CARDIOVERSION
Anesthesia: Monitor Anesthesia Care

## 2015-06-27 MED ORDER — SODIUM CHLORIDE 0.9 % IV SOLN
INTRAVENOUS | Status: DC
  Administered 2015-06-27: 13:00:00 via INTRAVENOUS

## 2015-06-27 MED ORDER — POTASSIUM CHLORIDE 10 MEQ/100ML IV SOLN
10.0000 meq | INTRAVENOUS | Status: AC
  Administered 2015-06-27 (×2): 10 meq via INTRAVENOUS
  Filled 2015-06-27 (×2): qty 100

## 2015-06-27 MED ORDER — PROPOFOL 500 MG/50ML IV EMUL
INTRAVENOUS | Status: DC | PRN
Start: 2015-06-27 — End: 2015-06-27

## 2015-06-27 MED ORDER — POTASSIUM CHLORIDE 10 MEQ/100ML IV SOLN
10.0000 meq | INTRAVENOUS | Status: DC
  Filled 2015-06-27 (×2): qty 100

## 2015-06-27 MED ORDER — PROPOFOL 10 MG/ML IV BOLUS
INTRAVENOUS | Status: DC | PRN
  Administered 2015-06-27: 50 mg via INTRAVENOUS
  Administered 2015-06-27: 60 mg via INTRAVENOUS
  Administered 2015-06-27: 90 mg via INTRAVENOUS

## 2015-06-27 NOTE — Anesthesia Preprocedure Evaluation (Addendum)
Anesthesia Evaluation  Patient identified by MRN, date of birth, ID band Patient awake    Reviewed: Allergy & Precautions, H&P , Patient's Chart, lab work & pertinent test results, reviewed documented beta blocker date and time   Airway Mallampati: II  TM Distance: >3 FB Neck ROM: full    Dental no notable dental hx. (+) Teeth Intact, Dental Advisory Given   Pulmonary sleep apnea , former smoker,    Pulmonary exam normal breath sounds clear to auscultation       Cardiovascular hypertension, Pt. on medications  Rhythm:regular Rate:Normal     Neuro/Psych    GI/Hepatic   Endo/Other    Renal/GU      Musculoskeletal  (+) Arthritis , Osteoarthritis,    Abdominal   Peds  Hematology   Anesthesia Other Findings  Hypertension         Persistent atrial fibrillation    Obesity      Bilateral pulmonary embolism ([place] ) 3/14 admitted to [facility],  treated with Xarelto   Mitral regurgitation 04/24/2013 Mild by TEE      Restless leg syndrome  takes gabapentin  OSA .Marland Kitchen...  noncompliant with CPAP.        Reproductive/Obstetrics                            Anesthesia Physical Anesthesia Plan  ASA: III  Anesthesia Plan: MAC   Post-op Pain Management:    Induction: Intravenous  Airway Management Planned: Mask and Natural Airway  Additional Equipment:   Intra-op Plan:   Post-operative Plan:   Informed Consent: I have reviewed the patients History and Physical, chart, labs and discussed the procedure including the risks, benefits and alternatives for the proposed anesthesia with the patient or authorized representative who has indicated his/her understanding and acceptance.   Dental Advisory Given  Plan Discussed with: CRNA, Surgeon and Anesthesiologist  Anesthesia Plan Comments: (Discussed sedation and potential to need to place airway or ETT if warranted by clinical changes  intra-operatively. We will start procedure as MAC.)       Anesthesia Quick Evaluation

## 2015-06-27 NOTE — Interval H&P Note (Signed)
History and Physical Interval Note:  06/27/2015 1:24 PM  Brandon B Westley Jr.  has presented today for surgery, with the diagnosis of A FIB  The various methods of treatment have been discussed with the patient and family. After consideration of risks, benefits and other options for treatment, the patient has consented to  Procedure(s): CARDIOVERSION (N/A) as a surgical intervention .  The patient's history has been reviewed, patient examined, no change in status, stable for surgery.  I have reviewed the patient's chart and labs.  Questions were answered to the patient's satisfaction.     Brandon [name]

## 2015-06-27 NOTE — Procedures (Signed)
Electrical Cardioversion Procedure Note Brandon [name] [id number] March 02, 1950  Procedure: Electrical Cardioversion Indications:  Atrial Fibrillation.  He has been on warfarin with therapeutic INR > 1 month.   Procedure Details Consent: Risks of procedure as well as the alternatives and risks of each were explained to the (patient/caregiver).  Consent for procedure obtained. Time Out: Verified patient identification, verified procedure, site/side was marked, verified correct patient position, special equipment/implants available, medications/allergies/relevent history reviewed, required imaging and test results available.  Performed  Patient placed on cardiac monitor, pulse oximetry, supplemental oxygen as necessary.  Sedation given: Propofol per anesthesiology Pacer pads placed anterior and posterior chest.  Cardioverted 3 time(s).  Cardioverted at [place].  Sternal pressure was used for the 2nd and 3rd attempts.  He did not convert even briefly to NSR.   Evaluation Findings: Post procedure EKG shows: Atrial Fibrillation Complications: None Patient did tolerate procedure well.  Followup with Dr Rayann Heman to determine next steps.   Loralie Champagne 06/27/2015, 1:34 PM

## 2015-06-27 NOTE — Progress Notes (Signed)
Patient received 20 meq of KCl and was ready for D/C. Vitals were back to pre admission results, IV was dc'd. Pt was instructed to dress and then we would transport him to the main entrance where his daughter was waiting to drive him home. Patient was thought to have gone to the restroom and door was shut, evidently patient left. Wheel chair was at the entrance of his room the entire time. Daughter Vicente Males was called at [phone] she did not answer message left. SMonday RN

## 2015-06-27 NOTE — Transfer of Care (Signed)
Immediate Anesthesia Transfer of Care Note  Patient: Brandon Quarry Moldovan Jr.  Procedure(s) Performed: Procedure(s): CARDIOVERSION (N/A)  Patient Location: Endoscopy Unit  Anesthesia Type:MAC  Level of Consciousness: awake, alert , oriented and patient cooperative  Airway & Oxygen Therapy: Patient Spontanous Breathing and Patient connected to nasal cannula oxygen  Post-op Assessment: Report given to RN, Post -op Vital signs reviewed and stable and Patient moving all extremities X 4  Post vital signs: Reviewed and stable  Last Vitals:  Filed Vitals:   06/27/15 1135 06/27/15 1342  BP: 137/84 152/113  Pulse: 92   Temp: 37.1 C   Resp: 12 16    Last Pain: There were no vitals filed for this visit.       Complications: No apparent anesthesia complications

## 2015-06-27 NOTE — H&P (View-Only) (Signed)
Electrophysiology Office Note   Date:  06/05/2015   ID:  Jacksyn Trainer., DOB 1950-08-10, MRN [id number]  PCP:  Nilda Simmer, MD   Primary Electrophysiologist: Thompson Grayer, MD    Chief Complaint  Patient presents with  . Atrial Fibrillation     History of Present Illness: Brandon [name]. is a 65 y.o. male who presents today for electrophysiology evaluation.  He appears to have returned to afib since at least February.  Mostly unaware.   Rare palpitations.   Reports compliance with medicines.  Finds heart rates at home to be mostly < 100 bpm though he has recently not been as diligent in checking this.  Today, he denies symptoms of chest pain, shortness of breath, orthopnea, PND, lower extremity edema, claudication, dizziness, presyncope, syncope, bleeding, or neurologic sequela. The patient is tolerating medications without difficulties and is otherwise without complaint today.    Past Medical History  Diagnosis Date  . Arthritis   . Sciatica   . Anxiety   . Hypertension   . Osteoporosis   . Cataract   . Persistent atrial fibrillation ([place])   . Obesity   . Bilateral pulmonary embolism ([place]) 3/14    admitted to [facility],  treated with Xarelto  . Chronic pain   . Thrombus of left atrial appendage 03/09/2013  . Mitral regurgitation 04/24/2013    Mild by TEE  . Benign neoplasm of colon   . Benign prostatic hyperplasia with urinary obstruction   . Hyperlipemia   . Insomnia   . Major depressive disorder, recurrent episode ([place])   . Ventral hernia, unspecified, without mention of obstruction or gangrene     right abdominal wall  . Restless leg syndrome     takes gabapentin  . OSA (obstructive sleep apnea)     noncompliant with CPAP.  07/27/13- awaiting a CPAP,   Past Surgical History  Procedure Laterality Date  . Knee arthroscopy      left  . Colonoscopy w/ polypectomy    . Rotator cuff repair      right  . Tonsillectomy    . Cataract extraction    . Hernia repair   07/06/2011  . Replacement total knee Left   . Tee without cardioversion N/A 03/21/2012    Procedure: TRANSESOPHAGEAL ECHOCARDIOGRAM (TEE);  Surgeon: Birdie Riddle, MD;  Location: [place];  Service: Cardiovascular;  Laterality: N/A;  . Cardioversion N/A 03/21/2012    Procedure: CARDIOVERSION;  Surgeon: Birdie Riddle, MD;  Location: [facility] ENDOSCOPY;  Service: Cardiovascular;  Laterality: N/A;  . Tee without cardioversion N/A 04/24/2013    Procedure: TRANSESOPHAGEAL ECHOCARDIOGRAM (TEE);  Surgeon: Dorothy Spark, MD;  Location: [place];  Service: Cardiovascular;  Laterality: N/A;  . Cardioversion N/A 04/24/2013    Procedure: CARDIOVERSION;  Surgeon: Dorothy Spark, MD;  Location: [place];  Service: Cardiovascular;  Laterality: N/A;  . Eye surgery Right     cataract  . Femur fracture surgery    . Inguinal hernia repair Right 07/28/2013    Procedure: RIGHT INGUINAL HERNIA REPAIR;  Surgeon: Imogene Burn. Georgette Dover, MD;  Location: [place];  Service: General;  Laterality: Right;  . Insertion of mesh Right 07/28/2013    Procedure: INSERTION OF MESH;  Surgeon: Imogene Burn. Georgette Dover, MD;  Location: [place];  Service: General;  Laterality: Right;  . Rotator cuff repair Left 03/08/2014    DR SUPPLE  . Shoulder arthroscopy with rotator cuff repair and subacromial decompression Left 03/08/2014  Procedure: LEFT SHOULDER ARTHROSCOPY WITH ROTATOR CUFF REPAIR/SUBACROMIAL DECOMPRESSION/DISTAL CLAVICLE RESECTION;  Surgeon: Marin Shutter, MD;  Location: [place];  Service: Orthopedics;  Laterality: Left;     Current Outpatient Prescriptions  Medication Sig Dispense Refill  . amiodarone (PACERONE) 200 MG tablet Take 0.5 tablets (100 mg total) by mouth daily. 30 tablet 3  . amLODipine (NORVASC) 10 MG tablet Take 10 mg by mouth daily.    . cyclobenzaprine (FLEXERIL) 5 MG tablet Take 1 tablet (5 mg total) by mouth every 8 (eight) hours as needed for muscle spasms. 90 tablet 2  . escitalopram (LEXAPRO) 20 MG tablet Take  20 mg by mouth daily.    Marland Kitchen gabapentin (NEURONTIN) 800 MG tablet Take 1 tablet (800 mg total) by mouth See admin instructions. Takes 1 tablet by mouth daily with supper, and take 1 tablet at bedtime as needed 60 tablet 11  . lidocaine (LIDODERM) 5 % Place 1 patch onto the skin daily as needed (knee pain).     Marland Kitchen losartan (COZAAR) 100 MG tablet Take 100 mg by mouth daily.    . tamsulosin (FLOMAX) 0.4 MG CAPS capsule Take 0.4 mg by mouth 2 (two) times daily.     Marland Kitchen warfarin (COUMADIN) 5 MG tablet TAKE AS DIRECTED BY COUMADIN CLINIC 30 tablet 3   No current facility-administered medications for this visit.    Allergies:   Ace inhibitors   Social History:  The patient  reports that he has quit smoking. He has never used smokeless tobacco. He reports that he drinks alcohol. He reports that he does not use illicit drugs.   Family History:  The patient's  family history includes CVA in his other; Cancer in his father and paternal grandmother; Diabetes in his other.    ROS:  Please see the history of present illness.   All other systems are reviewed and negative.    PHYSICAL EXAM: VS:  BP 98/72 mmHg  Pulse 111  Ht 6\' 4"  (1.93 m)  Wt 212 lb (96.163 kg)  BMI 25.82 kg/m2  SpO2 95% , BMI Body mass index is 25.82 kg/(m^2). GEN: Well nourished, well developed, in no acute distress HEENT: normal Neck: no JVD, carotid bruits, or masses Cardiac: tachycardic irregular rhythm; no murmurs, rubs, or gallops,no edema  Respiratory:  clear to auscultation bilaterally, normal work of breathing GI: soft, nontender, nondistended, + BS MS: no deformity or atrophy Skin: warm and dry  Neuro:  Strength and sensation are intact Psych: euthymic mood, full affect  EKG:  EKG is ordered today. The ekg ordered today shows afib, V rate 110 bpm   Recent Labs: 10/08/2014: BUN 20; Creatinine, Ser 0.85; Hemoglobin 13.7; Platelets 149*; Potassium 3.0*; Sodium 139 11/27/2014: ALT 19; TSH 1.827    Lipid Panel       Component Value Date/Time   CHOL 170 03/17/2012 0432   TRIG 117 03/17/2012 0432   HDL 30* 03/17/2012 0432   CHOLHDL 5.7 03/17/2012 0432   VLDL 23 03/17/2012 0432   LDLCALC 117* 03/17/2012 0432     Wt Readings from Last 3 Encounters:  06/05/15 212 lb (96.163 kg)  02/26/15 268 lb 3.2 oz (121.655 kg)  12/13/14 279 lb 9.6 oz (126.826 kg)    Other studies Reviewed: Additional studies/ records that were reviewed today include: echo 2015   Review of the above records today demonstrates: preserved EF   ASSESSMENT AND PLAN:  1.  Persistent afib Returned to afib though mostly unaware Anticoagulated with coumadin Will increase  amiodarone to 200mg  BID.  Will alert anticoagulation clinic so that coumadin can be adjusted.  Weekly INRs Cardioversion after 3 weeks of increased amiodarone.  Risks of procedure discussed with patient who wishes to proceed. Return to see me in 4 weeks Check lfts, tfts Repeat echo  2. HTN Stable No change required today  3. OSA Stable No change required today   Current medicines are reviewed at length with the patient today.   The patient does not have concerns regarding his medicines.  The following changes were made today:  none  Return to see me in 4 weeks (1 week post cardioversion) with an echo  Signed, Thompson Grayer, MD  06/05/2015 10:16 AM     [facility] [address] [place] [address] [phone] (office) [phone] (fax)

## 2015-06-27 NOTE — Anesthesia Postprocedure Evaluation (Signed)
Anesthesia Post Note  Patient: Brandon [name].  Procedure(s) Performed: Procedure(s) (LRB): CARDIOVERSION (N/A)  Patient location during evaluation: PACU Anesthesia Type: MAC Level of consciousness: awake and alert Pain management: pain level controlled Vital Signs Assessment: post-procedure vital signs reviewed and stable Respiratory status: spontaneous breathing, nonlabored ventilation, respiratory function stable and patient connected to nasal cannula oxygen Cardiovascular status: stable and blood pressure returned to baseline Anesthetic complications: no    Last Vitals:  Filed Vitals:   06/27/15 1520 06/27/15 1530  BP:  159/122  Pulse: 89 114  Temp:    Resp: 16 14    Last Pain: There were no vitals filed for this visit.               Riccardo Dubin

## 2015-06-27 NOTE — Addendum Note (Signed)
Addended by: Eulis Foster on: 06/27/2015 11:01 AM   Modules accepted: Orders

## 2015-06-27 NOTE — Discharge Instructions (Signed)

## 2015-07-03 ENCOUNTER — Ambulatory Visit (INDEPENDENT_AMBULATORY_CARE_PROVIDER_SITE_OTHER): Payer: Medicare Other | Admitting: Internal Medicine

## 2015-07-03 ENCOUNTER — Other Ambulatory Visit: Payer: Self-pay | Admitting: Neurology

## 2015-07-03 ENCOUNTER — Encounter: Payer: Self-pay | Admitting: Internal Medicine

## 2015-07-03 ENCOUNTER — Ambulatory Visit (INDEPENDENT_AMBULATORY_CARE_PROVIDER_SITE_OTHER): Payer: Medicare Other | Admitting: *Deleted

## 2015-07-03 ENCOUNTER — Other Ambulatory Visit: Payer: Self-pay | Admitting: Internal Medicine

## 2015-07-03 ENCOUNTER — Other Ambulatory Visit: Payer: Self-pay

## 2015-07-03 ENCOUNTER — Ambulatory Visit (HOSPITAL_COMMUNITY): Payer: Medicare Other | Attending: Cardiology

## 2015-07-03 VITALS — BP 133/73 | HR 89 | Ht 76.0 in | Wt 257.6 lb

## 2015-07-03 DIAGNOSIS — I77810 Thoracic aortic ectasia: Secondary | ICD-10-CM | POA: Insufficient documentation

## 2015-07-03 DIAGNOSIS — I481 Persistent atrial fibrillation: Secondary | ICD-10-CM | POA: Diagnosis not present

## 2015-07-03 DIAGNOSIS — I4891 Unspecified atrial fibrillation: Secondary | ICD-10-CM | POA: Diagnosis present

## 2015-07-03 DIAGNOSIS — Z5181 Encounter for therapeutic drug level monitoring: Secondary | ICD-10-CM | POA: Diagnosis not present

## 2015-07-03 DIAGNOSIS — I480 Paroxysmal atrial fibrillation: Secondary | ICD-10-CM

## 2015-07-03 DIAGNOSIS — I10 Essential (primary) hypertension: Secondary | ICD-10-CM

## 2015-07-03 DIAGNOSIS — I517 Cardiomegaly: Secondary | ICD-10-CM | POA: Diagnosis not present

## 2015-07-03 DIAGNOSIS — I4819 Other persistent atrial fibrillation: Secondary | ICD-10-CM

## 2015-07-03 DIAGNOSIS — G4733 Obstructive sleep apnea (adult) (pediatric): Secondary | ICD-10-CM

## 2015-07-03 DIAGNOSIS — I340 Nonrheumatic mitral (valve) insufficiency: Secondary | ICD-10-CM | POA: Insufficient documentation

## 2015-07-03 DIAGNOSIS — I351 Nonrheumatic aortic (valve) insufficiency: Secondary | ICD-10-CM | POA: Insufficient documentation

## 2015-07-03 LAB — ECHOCARDIOGRAM COMPLETE
E decel time: 239 msec
E/e' ratio: 5
FS: 31 % (ref 28–44)
IVS/LV PW RATIO, ED: 1.2
LA ID, A-P, ES: 50 mm
LA diam end sys: 50 mm
LA diam index: 2.02 cm/m2
LA vol A4C: 97 ml
LA vol index: 45.6 mL/m2
LA vol: 113 mL
LV E/e' medial: 5
LV E/e'average: 5
LV PW d: 14.3 mm — AB (ref 0.6–1.1)
LV e' LATERAL: 15 cm/s
LVOT SV: 91 mL
LVOT VTI: 18.5 cm
LVOT area: 4.91 cm2
LVOT diameter: 25 mm
LVOT peak vel: 94 cm/s
MV Dec: 239
MV Peak grad: 2 mmHg
MV pk E vel: 75 m/s
Reg peak vel: 214 cm/s
TDI e' lateral: 15
TDI e' medial: 11
TR max vel: 214 cm/s

## 2015-07-03 LAB — POCT INR: INR: 3.8

## 2015-07-03 MED ORDER — AMIODARONE HCL 200 MG PO TABS: 200.0000 mg | ORAL_TABLET | Freq: Every day | ORAL | Status: DC

## 2015-07-03 MED ORDER — CARVEDILOL 6.25 MG PO TABS: 6.2500 mg | ORAL_TABLET | Freq: Two times a day (BID) | ORAL | Status: DC

## 2015-07-03 NOTE — Patient Instructions (Addendum)
Medication Instructions:  Your physician has recommended you make the following change in your medication:  1) Decrease Amiodarone to 200 mg daily 2) Start Carvedilol 6.25 mg twice daily   Labwork: Your physician recommends that you return for lab work on 07/23/15 at 9:00am--do not have to be fasting  Will need weekly INR's starting now---Coumadin clinic will call you   Testing/Procedures: Your physician has requested that you have a TEE. During a TEE, sound waves are used to create images of your heart. It provides your doctor with information about the size and shape of your heart and how well your heart's chambers and valves are working. In this test, a transducer is attached to the end of a flexible tube that's guided down your throat and into your esophagus (the tube leading from you mouth to your stomach) to get a more detailed image of your heart. You are not awake for the procedure. Please see the instruction sheet given to you today. For further information please visit [email or web address].info/  Please arrive at [facility] at 6:30am Do not eat or drink after midnight the night before procedure Okay to take medications the morning of procedure Will need someone to drive you home after procedure  Your physician has recommended that you have an ablation. Catheter ablation is a medical procedure used to treat some cardiac arrhythmias (irregular heartbeats). During catheter ablation, a long, thin, flexible tube is put into a blood vessel in your groin (upper thigh), or neck. This tube is called an ablation catheter. It is then guided to your heart through the blood vessel. Radio frequency waves destroy small areas of heart tissue where abnormal heartbeats may cause an arrhythmia to start. Please see the instruction sheet given to you today.---07/30/15   Please arrive at [facility] at 5:30am Do not eat  or drink after midnight the night before procedure Do not take medications the morning of procedure Will need someone to drive you home after procedure Plan for one night stay at the hospital       Follow-Up: Your physician recommends that you schedule a follow-up appointment in: 4 weeks from 07/30/15 with Roderic Palau, NP in [place] clinic and 3 months from 07/30/15 with Dr Rayann Heman   Any Other Special Instructions Will Be Listed Below (If Applicable).     If you need a refill on your cardiac medications before your next appointment, please call your pharmacy.

## 2015-07-03 NOTE — Progress Notes (Signed)
Electrophysiology Office Note   Date:  07/03/2015   ID:  Brandon [name]., DOB 03/17/1950, MRN [id number]  PCP:  Nilda Simmer, MD   Primary Electrophysiologist: Thompson Grayer, MD    Chief Complaint  Patient presents with  . Atrial Fibrillation     History of Present Illness: Brandon Lias. is a 65 y.o. male who presents today for electrophysiology evaluation.  He appears to have returned to afib since at least February.  He has failed medical therapy with amiodarone and recent cardioversion was unsuccessful.  Nausea with increased amiodarone.  Frustrated by [employer].  He has fatigue and decreased exercise tolerance as symptoms. Today, he denies symptoms of chest pain, shortness of breath, orthopnea, PND, lower extremity edema, claudication, dizziness, presyncope, syncope, bleeding, or neurologic sequela. The patient is tolerating medications without difficulties and is otherwise without complaint today.    Past Medical History  Diagnosis Date  . Arthritis   . Sciatica   . Anxiety   . Hypertension   . Osteoporosis   . Cataract   . Persistent atrial fibrillation ([place])   . Obesity   . Bilateral pulmonary embolism ([place]) 3/14    admitted to [facility],  treated with Xarelto  . Chronic pain   . Thrombus of left atrial appendage 03/09/2013  . Mitral regurgitation 04/24/2013    Mild by TEE  . Benign neoplasm of colon   . Benign prostatic hyperplasia with urinary obstruction   . Hyperlipemia   . Insomnia   . Major depressive disorder, recurrent episode ([place])   . Ventral hernia, unspecified, without mention of obstruction or gangrene     right abdominal wall  . Restless leg syndrome     takes gabapentin  . OSA (obstructive sleep apnea)     noncompliant with CPAP.  07/27/13- awaiting a CPAP,   Past Surgical History  Procedure Laterality Date  . Knee arthroscopy      left  . Colonoscopy w/ polypectomy    . Rotator cuff repair      right  . Tonsillectomy    . Cataract extraction    .  Hernia repair  07/06/2011  . Replacement total knee Left   . Tee without cardioversion N/A 03/21/2012    Procedure: TRANSESOPHAGEAL ECHOCARDIOGRAM (TEE);  Surgeon: Birdie Riddle, MD;  Location: [place];  Service: Cardiovascular;  Laterality: N/A;  . Cardioversion N/A 03/21/2012    Procedure: CARDIOVERSION;  Surgeon: Birdie Riddle, MD;  Location: [facility] ENDOSCOPY;  Service: Cardiovascular;  Laterality: N/A;  . Tee without cardioversion N/A 04/24/2013    Procedure: TRANSESOPHAGEAL ECHOCARDIOGRAM (TEE);  Surgeon: Dorothy Spark, MD;  Location: [place];  Service: Cardiovascular;  Laterality: N/A;  . Cardioversion N/A 04/24/2013    Procedure: CARDIOVERSION;  Surgeon: Dorothy Spark, MD;  Location: [place];  Service: Cardiovascular;  Laterality: N/A;  . Eye surgery Right     cataract  . Femur fracture surgery    . Inguinal hernia repair Right 07/28/2013    Procedure: RIGHT INGUINAL HERNIA REPAIR;  Surgeon: Imogene Burn. Georgette Dover, MD;  Location: [place];  Service: General;  Laterality: Right;  . Insertion of mesh Right 07/28/2013    Procedure: INSERTION OF MESH;  Surgeon: Imogene Burn. Georgette Dover, MD;  Location: [place];  Service: General;  Laterality: Right;  . Rotator cuff repair Left 03/08/2014    DR SUPPLE  . Shoulder arthroscopy with rotator cuff repair and subacromial decompression Left 03/08/2014    Procedure: LEFT SHOULDER ARTHROSCOPY WITH  ROTATOR CUFF REPAIR/SUBACROMIAL DECOMPRESSION/DISTAL CLAVICLE RESECTION;  Surgeon: Marin Shutter, MD;  Location: [place];  Service: Orthopedics;  Laterality: Left;  . Cardioversion N/A 06/27/2015    Procedure: CARDIOVERSION;  Surgeon: Larey Dresser, MD;  Location: [place];  Service: Cardiovascular;  Laterality: N/A;     Current Outpatient Prescriptions  Medication Sig Dispense Refill  . amiodarone (PACERONE) 200 MG tablet Take 1 tablet (200 mg total) by mouth 2 (two) times daily. 60 tablet 1  . amLODipine (NORVASC) 10 MG tablet Take 10 mg by mouth daily.     . cyclobenzaprine (FLEXERIL) 5 MG tablet Take 1 tablet (5 mg total) by mouth every 8 (eight) hours as needed for muscle spasms. 90 tablet 2  . escitalopram (LEXAPRO) 20 MG tablet Take 20 mg by mouth daily.    Marland Kitchen gabapentin (NEURONTIN) 800 MG tablet Take 1 tablet (800 mg total) by mouth See admin instructions. Takes 1 tablet by mouth daily with supper, and take 1 tablet at bedtime as needed 60 tablet 11  . lidocaine (LIDODERM) 5 % Place 1 patch onto the skin daily as needed (knee pain).     Marland Kitchen losartan (COZAAR) 100 MG tablet Take 100 mg by mouth daily.    . tamsulosin (FLOMAX) 0.4 MG CAPS capsule Take 0.4 mg by mouth 2 (two) times daily.     Marland Kitchen warfarin (COUMADIN) 5 MG tablet TAKE AS DIRECTED BY COUMADIN CLINIC 30 tablet 3   No current facility-administered medications for this visit.    Allergies:   Ace inhibitors   Social History:  The patient  reports that he has quit smoking. He has never used smokeless tobacco. He reports that he drinks alcohol. He reports that he does not use illicit drugs.   Family History:  The patient's  family history includes CVA in his other; Cancer in his father and paternal grandmother; Diabetes in his other.    ROS:  Please see the history of present illness.   All other systems are reviewed and negative.    PHYSICAL EXAM: VS:  BP 133/73 mmHg  Pulse 89  Ht 6\' 4"  (1.93 m)  Wt 257 lb 9.6 oz (116.847 kg)  BMI 31.37 kg/m2 , BMI Body mass index is 31.37 kg/(m^2). GEN: Well nourished, well developed, in no acute distress HEENT: normal Neck: no JVD, carotid bruits, or masses Cardiac: tachycardic irregular rhythm; no murmurs, rubs, or gallops,no edema  Respiratory:  clear to auscultation bilaterally, normal work of breathing GI: soft, nontender, nondistended, + BS MS: no deformity or atrophy Skin: warm and dry  Neuro:  Strength and sensation are intact Psych: euthymic mood, full affect  EKG:  EKG is ordered today. The ekg ordered today shows afib, V rate  105 bpm. LAHB   Recent Labs: 06/05/2015: ALT 13; TSH 2.07 06/27/2015: BUN 20; Creatinine, Ser 1.08; Hemoglobin 14.1; Platelets 157; Potassium 3.0*; Sodium 141    Lipid Panel     Component Value Date/Time   CHOL 170 03/17/2012 0432   TRIG 117 03/17/2012 0432   HDL 30* 03/17/2012 0432   CHOLHDL 5.7 03/17/2012 0432   VLDL 23 03/17/2012 0432   LDLCALC 117* 03/17/2012 0432     Wt Readings from Last 3 Encounters:  07/03/15 257 lb 9.6 oz (116.847 kg)  06/27/15 260 lb (117.935 kg)  06/05/15 212 lb (96.163 kg)    Other studies Reviewed: Additional studies/ records that were reviewed today include: personally reviewed echo images today,  EF about 45-50%, mild MR, LA size  50 mm   ASSESSMENT AND PLAN:  1.  Persistent afib Recurrent symptomatic persistent afib.  Failed medical therapy with amiodarone. Therapeutic strategies for afib including medicine and ablation were discussed in detail with the patient today. Risk, benefits, and alternatives to EP study and radiofrequency ablation for afib were also discussed in detail today. These risks include but are not limited to stroke, bleeding, vascular damage, tamponade, perforation, damage to the esophagus, lungs, and other structures, pulmonary vein stenosis, worsening renal function, and death. The patient understands these risk and wishes to proceed.  We will therefore proceed with catheter ablation at the next available time.  Given prior LA thrombus, will follow weekly INRs and obtain TEE prior to ablation Reduced amiodarone to 200mg  daily today Add coreg  2. HTN Stable No change required today  3. OSA Stable No change required today  Current medicines are reviewed at length with the patient today.   The patient does not have concerns regarding his medicines.  The following changes were made today:  none  Signed, Thompson Grayer, MD  07/03/2015 10:25 AM     [facility] [address] [place]  [address] [phone] (office) [phone] (fax)

## 2015-07-10 ENCOUNTER — Ambulatory Visit (INDEPENDENT_AMBULATORY_CARE_PROVIDER_SITE_OTHER): Payer: Medicare Other | Admitting: Pharmacist

## 2015-07-10 DIAGNOSIS — I480 Paroxysmal atrial fibrillation: Secondary | ICD-10-CM | POA: Diagnosis not present

## 2015-07-10 DIAGNOSIS — Z5181 Encounter for therapeutic drug level monitoring: Secondary | ICD-10-CM | POA: Diagnosis not present

## 2015-07-10 DIAGNOSIS — I481 Persistent atrial fibrillation: Secondary | ICD-10-CM | POA: Diagnosis not present

## 2015-07-10 DIAGNOSIS — I4819 Other persistent atrial fibrillation: Secondary | ICD-10-CM

## 2015-07-10 LAB — POCT INR: INR: 2.6

## 2015-07-17 ENCOUNTER — Ambulatory Visit (INDEPENDENT_AMBULATORY_CARE_PROVIDER_SITE_OTHER): Payer: Medicare Other | Admitting: *Deleted

## 2015-07-17 DIAGNOSIS — Z5181 Encounter for therapeutic drug level monitoring: Secondary | ICD-10-CM

## 2015-07-17 DIAGNOSIS — I481 Persistent atrial fibrillation: Secondary | ICD-10-CM

## 2015-07-17 DIAGNOSIS — I4819 Other persistent atrial fibrillation: Secondary | ICD-10-CM

## 2015-07-17 DIAGNOSIS — I480 Paroxysmal atrial fibrillation: Secondary | ICD-10-CM | POA: Diagnosis not present

## 2015-07-17 LAB — POCT INR: INR: 2.7

## 2015-07-23 ENCOUNTER — Other Ambulatory Visit (INDEPENDENT_AMBULATORY_CARE_PROVIDER_SITE_OTHER): Payer: Medicare Other

## 2015-07-23 ENCOUNTER — Ambulatory Visit (INDEPENDENT_AMBULATORY_CARE_PROVIDER_SITE_OTHER): Payer: Medicare Other | Admitting: *Deleted

## 2015-07-23 DIAGNOSIS — I480 Paroxysmal atrial fibrillation: Secondary | ICD-10-CM

## 2015-07-23 DIAGNOSIS — I4819 Other persistent atrial fibrillation: Secondary | ICD-10-CM

## 2015-07-23 DIAGNOSIS — Z5181 Encounter for therapeutic drug level monitoring: Secondary | ICD-10-CM | POA: Diagnosis not present

## 2015-07-23 DIAGNOSIS — I481 Persistent atrial fibrillation: Secondary | ICD-10-CM

## 2015-07-23 LAB — BASIC METABOLIC PANEL
BUN: 23 mg/dL (ref 7–25)
CO2: 31 mmol/L (ref 20–31)
Calcium: 9.1 mg/dL (ref 8.6–10.3)
Chloride: 104 mmol/L (ref 98–110)
Creat: 1.07 mg/dL (ref 0.70–1.25)
Glucose, Bld: 97 mg/dL (ref 65–99)
Potassium: 4.2 mmol/L (ref 3.5–5.3)
Sodium: 144 mmol/L (ref 135–146)

## 2015-07-23 LAB — CBC WITH DIFFERENTIAL/PLATELET
Basophils Absolute: 62 cells/uL (ref 0–200)
Basophils Relative: 1 %
Eosinophils Absolute: 558 cells/uL — ABNORMAL HIGH (ref 15–500)
Eosinophils Relative: 9 %
HCT: 44.4 % (ref 38.5–50.0)
Hemoglobin: 15.1 g/dL (ref 13.2–17.1)
Lymphocytes Relative: 22 %
Lymphs Abs: 1364 cells/uL (ref 850–3900)
MCH: 31.6 pg (ref 27.0–33.0)
MCHC: 34 g/dL (ref 32.0–36.0)
MCV: 92.9 fL (ref 80.0–100.0)
MPV: 10 fL (ref 7.5–12.5)
Monocytes Absolute: 620 cells/uL (ref 200–950)
Monocytes Relative: 10 %
Neutro Abs: 3596 cells/uL (ref 1500–7800)
Neutrophils Relative %: 58 %
Platelets: 198 10*3/uL (ref 140–400)
RBC: 4.78 MIL/uL (ref 4.20–5.80)
RDW: 14.3 % (ref 11.0–15.0)
WBC: 6.2 10*3/uL (ref 3.8–10.8)

## 2015-07-23 LAB — PROTIME-INR
INR: 1.8 — ABNORMAL HIGH
Prothrombin Time: 18.6 s — ABNORMAL HIGH (ref 9.0–11.5)

## 2015-07-23 LAB — POCT INR: INR: 2.2

## 2015-07-29 ENCOUNTER — Encounter (HOSPITAL_COMMUNITY)
Admission: RE | Disposition: A | Discharge status: Home / Self Care | Payer: Self-pay | Source: Ambulatory Visit | Attending: Internal Medicine

## 2015-07-29 ENCOUNTER — Ambulatory Visit (INDEPENDENT_AMBULATORY_CARE_PROVIDER_SITE_OTHER): Payer: Medicare Other | Admitting: *Deleted

## 2015-07-29 ENCOUNTER — Ambulatory Visit (HOSPITAL_BASED_OUTPATIENT_CLINIC_OR_DEPARTMENT_OTHER)
Admission: RE | Admit: 2015-07-29 | Discharge: 2015-07-29 | Disposition: A | Discharge status: Home / Self Care | Payer: Medicare Other | Source: Ambulatory Visit | Attending: Nurse Practitioner | Admitting: Nurse Practitioner

## 2015-07-29 ENCOUNTER — Encounter (HOSPITAL_COMMUNITY): Payer: Self-pay | Admitting: *Deleted

## 2015-07-29 ENCOUNTER — Ambulatory Visit (HOSPITAL_COMMUNITY)
Admission: RE | Admit: 2015-07-29 | Discharge: 2015-07-29 | Disposition: A | Discharge status: Home / Self Care | Payer: Medicare Other | Source: Ambulatory Visit | Attending: Internal Medicine | Admitting: Internal Medicine

## 2015-07-29 DIAGNOSIS — M1990 Unspecified osteoarthritis, unspecified site: Secondary | ICD-10-CM | POA: Insufficient documentation

## 2015-07-29 DIAGNOSIS — G4733 Obstructive sleep apnea (adult) (pediatric): Secondary | ICD-10-CM | POA: Insufficient documentation

## 2015-07-29 DIAGNOSIS — F419 Anxiety disorder, unspecified: Secondary | ICD-10-CM | POA: Insufficient documentation

## 2015-07-29 DIAGNOSIS — Z87891 Personal history of nicotine dependence: Secondary | ICD-10-CM | POA: Insufficient documentation

## 2015-07-29 DIAGNOSIS — G2581 Restless legs syndrome: Secondary | ICD-10-CM | POA: Diagnosis not present

## 2015-07-29 DIAGNOSIS — I481 Persistent atrial fibrillation: Secondary | ICD-10-CM

## 2015-07-29 DIAGNOSIS — F339 Major depressive disorder, recurrent, unspecified: Secondary | ICD-10-CM | POA: Diagnosis not present

## 2015-07-29 DIAGNOSIS — Z79899 Other long term (current) drug therapy: Secondary | ICD-10-CM | POA: Insufficient documentation

## 2015-07-29 DIAGNOSIS — I480 Paroxysmal atrial fibrillation: Secondary | ICD-10-CM | POA: Diagnosis not present

## 2015-07-29 DIAGNOSIS — E785 Hyperlipidemia, unspecified: Secondary | ICD-10-CM | POA: Insufficient documentation

## 2015-07-29 DIAGNOSIS — Z86711 Personal history of pulmonary embolism: Secondary | ICD-10-CM | POA: Diagnosis not present

## 2015-07-29 DIAGNOSIS — I10 Essential (primary) hypertension: Secondary | ICD-10-CM | POA: Insufficient documentation

## 2015-07-29 DIAGNOSIS — Z5181 Encounter for therapeutic drug level monitoring: Secondary | ICD-10-CM | POA: Diagnosis not present

## 2015-07-29 DIAGNOSIS — E669 Obesity, unspecified: Secondary | ICD-10-CM | POA: Insufficient documentation

## 2015-07-29 DIAGNOSIS — I4891 Unspecified atrial fibrillation: Secondary | ICD-10-CM

## 2015-07-29 DIAGNOSIS — Z7901 Long term (current) use of anticoagulants: Secondary | ICD-10-CM | POA: Diagnosis not present

## 2015-07-29 DIAGNOSIS — Z9119 Patient's noncompliance with other medical treatment and regimen: Secondary | ICD-10-CM | POA: Insufficient documentation

## 2015-07-29 DIAGNOSIS — G8929 Other chronic pain: Secondary | ICD-10-CM | POA: Diagnosis not present

## 2015-07-29 DIAGNOSIS — I4819 Other persistent atrial fibrillation: Secondary | ICD-10-CM

## 2015-07-29 HISTORY — PX: TEE WITHOUT CARDIOVERSION: SHX5443

## 2015-07-29 LAB — POCT INR: INR: 2.3

## 2015-07-29 SURGERY — ECHOCARDIOGRAM, TRANSESOPHAGEAL
Anesthesia: Moderate Sedation

## 2015-07-29 MED ORDER — FENTANYL CITRATE (PF) 100 MCG/2ML IJ SOLN
INTRAMUSCULAR | Status: DC | PRN
  Administered 2015-07-29: 25 ug via INTRAVENOUS

## 2015-07-29 MED ORDER — BUTAMBEN-TETRACAINE-BENZOCAINE 2-2-14 % EX AERO
INHALATION_SPRAY | CUTANEOUS | Status: DC | PRN
  Administered 2015-07-29: 2 via TOPICAL

## 2015-07-29 MED ORDER — MIDAZOLAM HCL 10 MG/2ML IJ SOLN
INTRAMUSCULAR | Status: DC | PRN
  Administered 2015-07-29 (×2): 2 mg via INTRAVENOUS

## 2015-07-29 MED ORDER — MIDAZOLAM HCL 5 MG/ML IJ SOLN
INTRAMUSCULAR | Status: AC
  Filled 2015-07-29: qty 2

## 2015-07-29 MED ORDER — SODIUM CHLORIDE 0.9 % IV SOLN: INTRAVENOUS | Status: DC

## 2015-07-29 MED ORDER — FENTANYL CITRATE (PF) 100 MCG/2ML IJ SOLN
INTRAMUSCULAR | Status: AC
  Filled 2015-07-29: qty 2

## 2015-07-29 NOTE — Discharge Instructions (Signed)

## 2015-07-29 NOTE — H&P (View-Only) (Signed)
Electrophysiology Office Note   Date:  07/03/2015   ID:  Brandon Suire., DOB 03-28-1950, MRN [id number]  PCP:  Brandon Simmer, MD   Primary Electrophysiologist: Brandon Grayer, MD    Chief Complaint  Patient presents with  . Atrial Fibrillation     History of Present Illness: Brandon [name]. is a 65 y.o. male who presents today for electrophysiology evaluation.  He appears to have returned to afib since at least February.  He has failed medical therapy with amiodarone and recent cardioversion was unsuccessful.  Nausea with increased amiodarone.  Frustrated by [employer].  He has fatigue and decreased exercise tolerance as symptoms. Today, he denies symptoms of chest pain, shortness of breath, orthopnea, PND, lower extremity edema, claudication, dizziness, presyncope, syncope, bleeding, or neurologic sequela. The patient is tolerating medications without difficulties and is otherwise without complaint today.    Past Medical History  Diagnosis Date  . Arthritis   . Sciatica   . Anxiety   . Hypertension   . Osteoporosis   . Cataract   . Persistent atrial fibrillation ([place])   . Obesity   . Bilateral pulmonary embolism ([place]) 3/14    admitted to [facility],  treated with Xarelto  . Chronic pain   . Thrombus of left atrial appendage 03/09/2013  . Mitral regurgitation 04/24/2013    Mild by TEE  . Benign neoplasm of colon   . Benign prostatic hyperplasia with urinary obstruction   . Hyperlipemia   . Insomnia   . Major depressive disorder, recurrent episode ([place])   . Ventral hernia, unspecified, without mention of obstruction or gangrene     right abdominal wall  . Restless leg syndrome     takes gabapentin  . OSA (obstructive sleep apnea)     noncompliant with CPAP.  07/27/13- awaiting a CPAP,   Past Surgical History  Procedure Laterality Date  . Knee arthroscopy      left  . Colonoscopy w/ polypectomy    . Rotator cuff repair      right  . Tonsillectomy    . Cataract extraction    .  Hernia repair  07/06/2011  . Replacement total knee Left   . Tee without cardioversion N/A 03/21/2012    Procedure: TRANSESOPHAGEAL ECHOCARDIOGRAM (TEE);  Surgeon: Brandon Riddle, MD;  Location: [place];  Service: Cardiovascular;  Laterality: N/A;  . Cardioversion N/A 03/21/2012    Procedure: CARDIOVERSION;  Surgeon: Brandon Riddle, MD;  Location: [facility] END[facility];  Service: Cardiovascular;  Laterality: N/A;  . Tee without cardioversion N/A 04/24/2013    Procedure: TRANSESOPHAGEAL ECHOCARDIOGRAM (TEE);  Surgeon: Brandon Spark, MD;  Location: [place];  Service: Cardiovascular;  Laterality: N/A;  . Cardioversion N/A 04/24/2013    Procedure: CARDIOVERSION;  Surgeon: Brandon Spark, MD;  Location: [place];  Service: Cardiovascular;  Laterality: N/A;  . Eye surgery Right     cataract  . Femur fracture surgery    . Inguinal hernia repair Right 07/28/2013    Procedure: RIGHT INGUINAL HERNIA REPAIR;  Surgeon: Brandon Burn. Georgette Dover, MD;  Location: [place];  Service: General;  Laterality: Right;  . Insertion of mesh Right 07/28/2013    Procedure: INSERTION OF MESH;  Surgeon: Brandon Burn. Georgette Dover, MD;  Location: [place];  Service: General;  Laterality: Right;  . Rotator cuff repair Left 03/08/2014    DR SUPPLE  . Shoulder arthroscopy with rotator cuff repair and subacromial decompression Left 03/08/2014    Procedure: LEFT SHOULDER ARTHROSCOPY WITH  ROTATOR CUFF REPAIR/SUBACROMIAL DECOMPRESSION/DISTAL CLAVICLE RESECTION;  Surgeon: Brandon Shutter, MD;  Location: [place];  Service: Orthopedics;  Laterality: Left;  . Cardioversion N/A 06/27/2015    Procedure: CARDIOVERSION;  Surgeon: Brandon Dresser, MD;  Location: [place];  Service: Cardiovascular;  Laterality: N/A;     Current Outpatient Prescriptions  Medication Sig Dispense Refill  . amiodarone (PACERONE) 200 MG tablet Take 1 tablet (200 mg total) by mouth 2 (two) times daily. 60 tablet 1  . amLODipine (NORVASC) 10 MG tablet Take 10 mg by mouth daily.     . cyclobenzaprine (FLEXERIL) 5 MG tablet Take 1 tablet (5 mg total) by mouth every 8 (eight) hours as needed for muscle spasms. 90 tablet 2  . escitalopram (LEXAPRO) 20 MG tablet Take 20 mg by mouth daily.    Marland Kitchen gabapentin (NEURONTIN) 800 MG tablet Take 1 tablet (800 mg total) by mouth See admin instructions. Takes 1 tablet by mouth daily with supper, and take 1 tablet at bedtime as needed 60 tablet 11  . lidocaine (LIDODERM) 5 % Place 1 patch onto the skin daily as needed (knee pain).     Marland Kitchen losartan (COZAAR) 100 MG tablet Take 100 mg by mouth daily.    . tamsulosin (FLOMAX) 0.4 MG CAPS capsule Take 0.4 mg by mouth 2 (two) times daily.     Marland Kitchen warfarin (COUMADIN) 5 MG tablet TAKE AS DIRECTED BY COUMADIN CLINIC 30 tablet 3   No current facility-administered medications for this visit.    Allergies:   Ace inhibitors   Social History:  The patient  reports that he has quit smoking. He has never used smokeless tobacco. He reports that he drinks alcohol. He reports that he does not use illicit drugs.   Family History:  The patient's  family history includes CVA in his other; Cancer in his father and paternal grandmother; Diabetes in his other.    ROS:  Please see the history of present illness.   All other systems are reviewed and negative.    PHYSICAL EXAM: VS:  BP 133/73 mmHg  Pulse 89  Ht 6\' 4"  (1.93 m)  Wt 257 lb 9.6 oz (116.847 kg)  BMI 31.37 kg/m2 , BMI Body mass index is 31.37 kg/(m^2). GEN: Well nourished, well developed, in no acute distress HEENT: normal Neck: no JVD, carotid bruits, or masses Cardiac: tachycardic irregular rhythm; no murmurs, rubs, or gallops,no edema  Respiratory:  clear to auscultation bilaterally, normal work of breathing GI: soft, nontender, nondistended, + BS MS: no deformity or atrophy Skin: warm and dry  Neuro:  Strength and sensation are intact Psych: euthymic mood, full affect  EKG:  EKG is ordered today. The ekg ordered today shows afib, V rate  105 bpm. LAHB   Recent Labs: 06/05/2015: ALT 13; TSH 2.07 06/27/2015: BUN 20; Creatinine, Ser 1.08; Hemoglobin 14.1; Platelets 157; Potassium 3.0*; Sodium 141    Lipid Panel     Component Value Date/Time   CHOL 170 03/17/2012 0432   TRIG 117 03/17/2012 0432   HDL 30* 03/17/2012 0432   CHOLHDL 5.7 03/17/2012 0432   VLDL 23 03/17/2012 0432   LDLCALC 117* 03/17/2012 0432     Wt Readings from Last 3 Encounters:  07/03/15 257 lb 9.6 oz (116.847 kg)  06/27/15 260 lb (117.935 kg)  06/05/15 212 lb (96.163 kg)    Other studies Reviewed: Additional studies/ records that were reviewed today include: personally reviewed echo images today,  EF about 45-50%, mild MR, LA size  50 mm   ASSESSMENT AND PLAN:  1.  Persistent afib Recurrent symptomatic persistent afib.  Failed medical therapy with amiodarone. Therapeutic strategies for afib including medicine and ablation were discussed in detail with the patient today. Risk, benefits, and alternatives to EP study and radiofrequency ablation for afib were also discussed in detail today. These risks include but are not limited to stroke, bleeding, vascular damage, tamponade, perforation, damage to the esophagus, lungs, and other structures, pulmonary vein stenosis, worsening renal function, and death. The patient understands these risk and wishes to proceed.  We will therefore proceed with catheter ablation at the next available time.  Given prior LA thrombus, will follow weekly INRs and obtain TEE prior to ablation Reduced amiodarone to 200mg  daily today Add coreg  2. HTN Stable No change required today  3. OSA Stable No change required today  Current medicines are reviewed at length with the patient today.   The patient does not have concerns regarding his medicines.  The following changes were made today:  none  Signed, Brandon Grayer, MD  07/03/2015 10:25 AM     [facility] [address] [place]  [address] [phone] (office) [phone] (fax)

## 2015-07-29 NOTE — Anesthesia Preprocedure Evaluation (Addendum)
Anesthesia Evaluation  Patient identified by MRN, date of birth, ID band Patient awake    Reviewed: Allergy & Precautions, H&P , NPO status , Patient's Chart, lab work & pertinent test results, reviewed documented beta blocker date and time   History of Anesthesia Complications Negative for: history of anesthetic complications  Airway Mallampati: II  TM Distance: >3 FB Neck ROM: Full    Dental no notable dental hx. (+) Teeth Intact, Dental Advisory Given   Pulmonary asthma , sleep apnea , former smoker,    Pulmonary exam normal breath sounds clear to auscultation       Cardiovascular hypertension, Pt. on medications and Pt. on home beta blockers + dysrhythmias Atrial Fibrillation  Rhythm:Irregular Rate:Normal     Neuro/Psych Anxiety Depression negative neurological ROS     GI/Hepatic negative GI ROS, Neg liver ROS,   Endo/Other  negative endocrine ROS  Renal/GU negative Renal ROS  negative genitourinary   Musculoskeletal  (+) Arthritis , Osteoarthritis,    Abdominal   Peds  Hematology negative hematology ROS (+)   Anesthesia Other Findings   Reproductive/Obstetrics negative OB ROS                          Anesthesia Physical Anesthesia Plan  ASA: III  Anesthesia Plan: General   Post-op Pain Management:    Induction: Intravenous  Airway Management Planned: Oral ETT  Additional Equipment:   Intra-op Plan:   Post-operative Plan: Extubation in OR  Informed Consent: I have reviewed the patients History and Physical, chart, labs and discussed the procedure including the risks, benefits and alternatives for the proposed anesthesia with the patient or authorized representative who has indicated his/her understanding and acceptance.   Dental advisory given  Plan Discussed with: CRNA  Anesthesia Plan Comments:        Anesthesia Quick Evaluation

## 2015-07-29 NOTE — Interval H&P Note (Signed)
History and Physical Interval Note:  07/29/2015 8:03 AM  Brandon B Hyer Jr.  has presented today for surgery, with the diagnosis of A FIB   The various methods of treatment have been discussed with the patient and family. After consideration of risks, benefits and other options for treatment, the patient has consented to  Procedure(s): TRANSESOPHAGEAL ECHOCARDIOGRAM (TEE) (N/A) as a surgical intervention .  The patient's history has been reviewed, patient examined, no change in status, stable for surgery.  I have reviewed the patient's chart and labs.  Questions were answered to the patient's satisfaction.     Dorris Carnes

## 2015-07-29 NOTE — Op Note (Signed)
LA, LAA without masses   TV normal  Mild TR MV mildy thickened  Mild MR AV mildly thickened  No AI PV normal  LVEF normal   RVEF normal Atria are large Thoracic aorta is normal

## 2015-07-30 ENCOUNTER — Encounter (HOSPITAL_COMMUNITY)
Admission: RE | Disposition: A | Discharge status: Home / Self Care | Payer: Self-pay | Source: Ambulatory Visit | Attending: Internal Medicine

## 2015-07-30 ENCOUNTER — Ambulatory Visit (HOSPITAL_COMMUNITY)
Admission: RE | Admit: 2015-07-30 | Discharge: 2015-07-31 | Disposition: A | Discharge status: Home / Self Care | Payer: Medicare Other | Source: Ambulatory Visit | Attending: Internal Medicine | Admitting: Internal Medicine

## 2015-07-30 ENCOUNTER — Ambulatory Visit (HOSPITAL_COMMUNITY): Payer: Medicare Other | Admitting: Anesthesiology

## 2015-07-30 ENCOUNTER — Encounter (HOSPITAL_COMMUNITY): Payer: Self-pay | Admitting: Anesthesiology

## 2015-07-30 DIAGNOSIS — I481 Persistent atrial fibrillation: Secondary | ICD-10-CM | POA: Diagnosis not present

## 2015-07-30 DIAGNOSIS — I10 Essential (primary) hypertension: Secondary | ICD-10-CM | POA: Insufficient documentation

## 2015-07-30 DIAGNOSIS — M5430 Sciatica, unspecified side: Secondary | ICD-10-CM | POA: Insufficient documentation

## 2015-07-30 DIAGNOSIS — I4819 Other persistent atrial fibrillation: Secondary | ICD-10-CM | POA: Diagnosis present

## 2015-07-30 DIAGNOSIS — F419 Anxiety disorder, unspecified: Secondary | ICD-10-CM | POA: Diagnosis not present

## 2015-07-30 DIAGNOSIS — C189 Malignant neoplasm of colon, unspecified: Secondary | ICD-10-CM | POA: Diagnosis not present

## 2015-07-30 DIAGNOSIS — G8929 Other chronic pain: Secondary | ICD-10-CM | POA: Diagnosis not present

## 2015-07-30 DIAGNOSIS — I4891 Unspecified atrial fibrillation: Secondary | ICD-10-CM | POA: Insufficient documentation

## 2015-07-30 DIAGNOSIS — E785 Hyperlipidemia, unspecified: Secondary | ICD-10-CM | POA: Diagnosis not present

## 2015-07-30 DIAGNOSIS — Z7901 Long term (current) use of anticoagulants: Secondary | ICD-10-CM | POA: Diagnosis not present

## 2015-07-30 DIAGNOSIS — Z6831 Body mass index (BMI) 31.0-31.9, adult: Secondary | ICD-10-CM | POA: Insufficient documentation

## 2015-07-30 DIAGNOSIS — Z87891 Personal history of nicotine dependence: Secondary | ICD-10-CM | POA: Diagnosis not present

## 2015-07-30 DIAGNOSIS — F329 Major depressive disorder, single episode, unspecified: Secondary | ICD-10-CM | POA: Diagnosis not present

## 2015-07-30 DIAGNOSIS — G4700 Insomnia, unspecified: Secondary | ICD-10-CM | POA: Diagnosis not present

## 2015-07-30 DIAGNOSIS — Z86711 Personal history of pulmonary embolism: Secondary | ICD-10-CM | POA: Diagnosis not present

## 2015-07-30 DIAGNOSIS — M810 Age-related osteoporosis without current pathological fracture: Secondary | ICD-10-CM | POA: Diagnosis not present

## 2015-07-30 DIAGNOSIS — E669 Obesity, unspecified: Secondary | ICD-10-CM | POA: Insufficient documentation

## 2015-07-30 DIAGNOSIS — G2581 Restless legs syndrome: Secondary | ICD-10-CM | POA: Diagnosis not present

## 2015-07-30 DIAGNOSIS — Z823 Family history of stroke: Secondary | ICD-10-CM | POA: Diagnosis not present

## 2015-07-30 DIAGNOSIS — I340 Nonrheumatic mitral (valve) insufficiency: Secondary | ICD-10-CM | POA: Insufficient documentation

## 2015-07-30 DIAGNOSIS — G4733 Obstructive sleep apnea (adult) (pediatric): Secondary | ICD-10-CM | POA: Diagnosis not present

## 2015-07-30 DIAGNOSIS — M1990 Unspecified osteoarthritis, unspecified site: Secondary | ICD-10-CM | POA: Insufficient documentation

## 2015-07-30 DIAGNOSIS — I480 Paroxysmal atrial fibrillation: Secondary | ICD-10-CM | POA: Diagnosis present

## 2015-07-30 DIAGNOSIS — Z833 Family history of diabetes mellitus: Secondary | ICD-10-CM | POA: Diagnosis not present

## 2015-07-30 HISTORY — PX: ELECTROPHYSIOLOGIC STUDY: SHX172A

## 2015-07-30 LAB — POCT ACTIVATED CLOTTING TIME
Activated Clotting Time: 235 seconds
Activated Clotting Time: 257 seconds
Activated Clotting Time: 296 seconds
Activated Clotting Time: 186 seconds

## 2015-07-30 LAB — PROTIME-INR
INR: 2.22 — ABNORMAL HIGH (ref 0.00–1.49)
Prothrombin Time: 24.4 seconds — ABNORMAL HIGH (ref 11.6–15.2)

## 2015-07-30 SURGERY — ATRIAL FIBRILLATION ABLATION
Anesthesia: General

## 2015-07-30 MED ORDER — GABAPENTIN 800 MG PO TABS
800.0000 mg | ORAL_TABLET | Freq: Every day | ORAL | Status: DC
  Filled 2015-07-30: qty 1

## 2015-07-30 MED ORDER — CYCLOBENZAPRINE HCL 10 MG PO TABS: 5.0000 mg | ORAL_TABLET | Freq: Three times a day (TID) | ORAL | Status: DC | PRN

## 2015-07-30 MED ORDER — ONDANSETRON HCL 4 MG/2ML IJ SOLN
4.0000 mg | Freq: Four times a day (QID) | INTRAMUSCULAR | Status: DC | PRN
  Administered 2015-07-30: 4 mg via INTRAVENOUS

## 2015-07-30 MED ORDER — ONDANSETRON HCL 4 MG/2ML IJ SOLN
INTRAMUSCULAR | Status: DC | PRN
  Administered 2015-07-30 (×2): 4 mg via INTRAVENOUS

## 2015-07-30 MED ORDER — SUGAMMADEX SODIUM 200 MG/2ML IV SOLN
INTRAVENOUS | Status: DC | PRN
  Administered 2015-07-30: 200 mg via INTRAVENOUS

## 2015-07-30 MED ORDER — OXYCODONE-ACETAMINOPHEN 5-325 MG PO TABS
1.0000 | ORAL_TABLET | Freq: Once | ORAL | Status: AC
  Administered 2015-07-30: 2 via ORAL

## 2015-07-30 MED ORDER — LIDOCAINE HCL (CARDIAC) 20 MG/ML IV SOLN
INTRAVENOUS | Status: DC | PRN
  Administered 2015-07-30: 60 mg via INTRAVENOUS

## 2015-07-30 MED ORDER — LOSARTAN POTASSIUM 50 MG PO TABS
100.0000 mg | ORAL_TABLET | Freq: Every day | ORAL | Status: DC
  Administered 2015-07-31: 08:00:00 100 mg via ORAL
  Filled 2015-07-30: qty 2

## 2015-07-30 MED ORDER — ACETAMINOPHEN 325 MG PO TABS: 650.0000 mg | ORAL_TABLET | ORAL | Status: DC | PRN

## 2015-07-30 MED ORDER — SUCCINYLCHOLINE CHLORIDE 20 MG/ML IJ SOLN
INTRAMUSCULAR | Status: DC | PRN
  Administered 2015-07-30: 100 mg via INTRAVENOUS

## 2015-07-30 MED ORDER — MORPHINE SULFATE (PF) 2 MG/ML IV SOLN
INTRAVENOUS | Status: AC
  Filled 2015-07-30: qty 1

## 2015-07-30 MED ORDER — SODIUM CHLORIDE 0.9 % IV SOLN: 250.0000 mL | INTRAVENOUS | Status: DC | PRN

## 2015-07-30 MED ORDER — HYDROCODONE-ACETAMINOPHEN 5-325 MG PO TABS
1.0000 | ORAL_TABLET | ORAL | Status: DC | PRN
  Administered 2015-07-30: 1 via ORAL
  Filled 2015-07-30: qty 1

## 2015-07-30 MED ORDER — FENTANYL CITRATE (PF) 100 MCG/2ML IJ SOLN: 25.0000 ug | INTRAMUSCULAR | Status: DC | PRN

## 2015-07-30 MED ORDER — FENTANYL CITRATE (PF) 100 MCG/2ML IJ SOLN: INTRAMUSCULAR | Status: DC | PRN

## 2015-07-30 MED ORDER — HEPARIN SODIUM (PORCINE) 1000 UNIT/ML IJ SOLN
INTRAMUSCULAR | Status: DC | PRN
  Administered 2015-07-30 (×2): 1000 [IU] via INTRAVENOUS

## 2015-07-30 MED ORDER — IOPAMIDOL (ISOVUE-370) INJECTION 76%
INTRAVENOUS | Status: DC | PRN
  Administered 2015-07-30: 110 mL
  Administered 2015-07-30: 3 mL

## 2015-07-30 MED ORDER — HEPARIN SODIUM (PORCINE) 1000 UNIT/ML IJ SOLN
INTRAMUSCULAR | Status: AC
  Filled 2015-07-30: qty 1

## 2015-07-30 MED ORDER — ZOLPIDEM TARTRATE 5 MG PO TABS
5.0000 mg | ORAL_TABLET | Freq: Every evening | ORAL | Status: DC | PRN
Start: 2015-07-30 — End: 2015-07-31
  Administered 2015-07-30: 5 mg via ORAL
  Filled 2015-07-30: qty 1

## 2015-07-30 MED ORDER — ROCURONIUM BROMIDE 100 MG/10ML IV SOLN
INTRAVENOUS | Status: DC | PRN
  Administered 2015-07-30: 50 mg via INTRAVENOUS

## 2015-07-30 MED ORDER — EPHEDRINE SULFATE-NACL 50-0.9 MG/10ML-% IV SOSY
PREFILLED_SYRINGE | INTRAVENOUS | Status: DC | PRN
  Administered 2015-07-30: 10 mg via INTRAVENOUS

## 2015-07-30 MED ORDER — VECURONIUM BROMIDE 10 MG IV SOLR
INTRAVENOUS | Status: DC | PRN
  Administered 2015-07-30: 3 mg via INTRAVENOUS
  Administered 2015-07-30 (×2): 2 mg via INTRAVENOUS

## 2015-07-30 MED ORDER — WARFARIN SODIUM 7.5 MG PO TABS
7.5000 mg | ORAL_TABLET | Freq: Once | ORAL | Status: AC
  Administered 2015-07-30: 20:00:00 7.5 mg via ORAL
  Filled 2015-07-30: qty 1

## 2015-07-30 MED ORDER — BUPIVACAINE HCL (PF) 0.25 % IJ SOLN
INTRAMUSCULAR | Status: AC
  Filled 2015-07-30: qty 30

## 2015-07-30 MED ORDER — CARVEDILOL 3.125 MG PO TABS
6.2500 mg | ORAL_TABLET | Freq: Two times a day (BID) | ORAL | Status: DC
  Administered 2015-07-31: 6.25 mg via ORAL
  Filled 2015-07-30 (×2): qty 2

## 2015-07-30 MED ORDER — MIDAZOLAM HCL 5 MG/5ML IJ SOLN
INTRAMUSCULAR | Status: DC | PRN
  Administered 2015-07-30 (×2): 2 mg via INTRAVENOUS

## 2015-07-30 MED ORDER — GABAPENTIN 400 MG PO CAPS
800.0000 mg | ORAL_CAPSULE | Freq: Every day | ORAL | Status: DC
  Administered 2015-07-30: 800 mg via ORAL
  Filled 2015-07-30: qty 2

## 2015-07-30 MED ORDER — MORPHINE SULFATE (PF) 2 MG/ML IV SOLN
2.0000 mg | Freq: Once | INTRAVENOUS | Status: AC
  Administered 2015-07-30: 2 mg via INTRAVENOUS

## 2015-07-30 MED ORDER — BUPIVACAINE HCL (PF) 0.25 % IJ SOLN
INTRAMUSCULAR | Status: DC | PRN
  Administered 2015-07-30: 30 mL

## 2015-07-30 MED ORDER — FENTANYL CITRATE (PF) 100 MCG/2ML IJ SOLN
INTRAMUSCULAR | Status: DC | PRN
  Administered 2015-07-30: 50 ug via INTRAVENOUS
  Administered 2015-07-30: 100 ug via INTRAVENOUS

## 2015-07-30 MED ORDER — OFF THE BEAT BOOK
Freq: Once | Status: AC
  Administered 2015-07-30
  Filled 2015-07-30: qty 1

## 2015-07-30 MED ORDER — TAMSULOSIN HCL 0.4 MG PO CAPS
0.4000 mg | ORAL_CAPSULE | Freq: Two times a day (BID) | ORAL | Status: DC
  Administered 2015-07-30 – 2015-07-31 (×2): 0.4 mg via ORAL
  Filled 2015-07-30 (×2): qty 1

## 2015-07-30 MED ORDER — PROPOFOL 10 MG/ML IV BOLUS
INTRAVENOUS | Status: DC | PRN
  Administered 2015-07-30: 50 mg via INTRAVENOUS
  Administered 2015-07-30: 150 mg via INTRAVENOUS

## 2015-07-30 MED ORDER — PHENYLEPHRINE 40 MCG/ML (10ML) SYRINGE FOR IV PUSH (FOR BLOOD PRESSURE SUPPORT)
PREFILLED_SYRINGE | INTRAVENOUS | Status: DC | PRN
  Administered 2015-07-30 (×8): 80 ug via INTRAVENOUS

## 2015-07-30 MED ORDER — ONDANSETRON HCL 4 MG/2ML IJ SOLN
INTRAMUSCULAR | Status: AC
  Filled 2015-07-30: qty 2

## 2015-07-30 MED ORDER — SODIUM CHLORIDE 0.9 % IV SOLN
INTRAVENOUS | Status: DC
  Administered 2015-07-30: 10 mL/h via INTRAVENOUS
  Administered 2015-07-30 (×2): via INTRAVENOUS

## 2015-07-30 MED ORDER — OXYCODONE-ACETAMINOPHEN 5-325 MG PO TABS
ORAL_TABLET | ORAL | Status: AC
Start: 2015-07-30 — End: 2015-07-30
  Filled 2015-07-30: qty 2

## 2015-07-30 MED ORDER — AMLODIPINE BESYLATE 10 MG PO TABS
10.0000 mg | ORAL_TABLET | Freq: Every day | ORAL | Status: DC
  Administered 2015-07-31: 10 mg via ORAL
  Filled 2015-07-30: qty 1

## 2015-07-30 MED ORDER — AMIODARONE HCL 200 MG PO TABS
200.0000 mg | ORAL_TABLET | Freq: Every day | ORAL | Status: DC
  Administered 2015-07-31: 200 mg via ORAL
  Filled 2015-07-30: qty 1

## 2015-07-30 MED ORDER — PROTAMINE SULFATE 10 MG/ML IV SOLN
INTRAVENOUS | Status: DC | PRN
  Administered 2015-07-30: 10 mg via INTRAVENOUS
  Administered 2015-07-30: 20 mg via INTRAVENOUS

## 2015-07-30 MED ORDER — WARFARIN - PHYSICIAN DOSING INPATIENT
Freq: Every day | Status: DC
  Administered 2015-07-30: 18:00:00

## 2015-07-30 MED ORDER — HEPARIN SODIUM (PORCINE) 1000 UNIT/ML IJ SOLN
INTRAMUSCULAR | Status: DC | PRN
  Administered 2015-07-30: 5000 [IU] via INTRAVENOUS
  Administered 2015-07-30: 12000 [IU] via INTRAVENOUS
  Administered 2015-07-30: 4000 [IU] via INTRAVENOUS

## 2015-07-30 MED ORDER — ESCITALOPRAM OXALATE 20 MG PO TABS
20.0000 mg | ORAL_TABLET | Freq: Every day | ORAL | Status: DC
  Administered 2015-07-30 – 2015-07-31 (×2): 20 mg via ORAL
  Filled 2015-07-30 (×3): qty 1

## 2015-07-30 MED ORDER — SODIUM CHLORIDE 0.9% FLUSH: 3.0000 mL | INTRAVENOUS | Status: DC | PRN

## 2015-07-30 MED ORDER — SODIUM CHLORIDE 0.9% FLUSH: 3.0000 mL | Freq: Two times a day (BID) | INTRAVENOUS | Status: DC

## 2015-07-30 MED ORDER — IOPAMIDOL (ISOVUE-370) INJECTION 76%
INTRAVENOUS | Status: AC
  Filled 2015-07-30: qty 125

## 2015-07-30 SURGICAL SUPPLY — 21 items
BAG SNAP BAND KOVER 36X36 (MISCELLANEOUS) ×2 IMPLANT
BLANKET WARM UNDERBOD FULL ACC (MISCELLANEOUS) ×2 IMPLANT
CATH DIAG 6FR PIGTAIL (CATHETERS) ×2 IMPLANT
CATH NAVISTAR SMARTTOUCH DF (ABLATOR) ×2 IMPLANT
CATH SOUNDSTAR 3D IMAGING (CATHETERS) ×2 IMPLANT
CATH VARIABLE LASSO NAV 2515 (CATHETERS) ×2 IMPLANT
CATH WEBSTER BI DIR CS D-F CRV (CATHETERS) ×2 IMPLANT
COVER SWIFTLINK CONNECTOR (BAG) ×2 IMPLANT
NEEDLE TRANSEP BRK 71CM 407200 (NEEDLE) ×2 IMPLANT
PACK EP LATEX FREE (CUSTOM PROCEDURE TRAY) ×1
PACK EP LF (CUSTOM PROCEDURE TRAY) ×1 IMPLANT
PAD DEFIB LIFELINK (PAD) ×2 IMPLANT
PATCH CARTO3 (PAD) ×2 IMPLANT
SHEATH AVANTI 11F 11CM (SHEATH) ×2 IMPLANT
SHEATH PINNACLE 7F 10CM (SHEATH) ×4 IMPLANT
SHEATH PINNACLE 9F 10CM (SHEATH) ×2 IMPLANT
SHEATH SWARTZ TS SL2 63CM 8.5F (SHEATH) ×2 IMPLANT
SHIELD RADPAD SCOOP 12X17 (MISCELLANEOUS) ×2 IMPLANT
SYR MEDRAD MARK V 150ML (SYRINGE) ×2 IMPLANT
TUBING CONTRAST HIGH PRESS 48 (TUBING) ×2 IMPLANT
TUBING SMART ABLATE COOLFLOW (TUBING) ×2 IMPLANT

## 2015-07-30 NOTE — Discharge Instructions (Signed)
You have an appointment set up with the [facility].  Multiple studies have shown that being followed by a dedicated atrial fibrillation clinic in addition to the standard care you receive from your other physicians improves health. We believe that enrollment in the atrial fibrillation clinic will allow [place] to better care for you.   The phone number to the [facility] is [phone]. The clinic is staffed Monday through Friday from 8:30am to 5pm.  Parking Directions: The clinic is located in the Heart and Vascular Building connected to [facility] 1)From [address] turn on to [address] and go to the 3rd entrance  (Heart and Vascular entrance) on the right. 2)Look to the right for Heart &Vascular Parking Garage. 3)A code for the entrance is required please call the clinic to receive this.   4)Take the elevators to the 1st floor. Registration is in the room with the glass walls at the end of the hallway.  If you have any trouble parking or locating the clinic, please dont hesitate to call [phone].  No driving for 4 days. No lifting over 5 lbs for 1 week. No sexual activity for 1 week. You may return to work in 1 week. Keep procedure site clean & dry. If you notice increased pain, swelling, bleeding or pus, call/return!  You may shower, but no soaking baths/hot tubs/pools for 1 week.

## 2015-07-30 NOTE — Progress Notes (Signed)
Pt complain of lower abd pain that is chronic from Hx of Hernia per pt and wife. Pt given Zofran for nausea with some relief.  PA K Thompson in to see pt.  Meds orders for pain.

## 2015-07-30 NOTE — Interval H&P Note (Signed)
History and Physical Interval Note:  07/30/2015 6:45 AM  Brandon B Kapusta Jr.  has presented today for surgery, with the diagnosis of afib  The various methods of treatment have been discussed with the patient and family. After consideration of risks, benefits and other options for treatment, the patient has consented to  Procedure(s): Atrial Fibrillation Ablation (N/A) as a surgical intervention .  The patient's history has been reviewed, patient examined, no change in status, stable for surgery.  I have reviewed the patient's chart and labs.  Questions were answered to the patient's satisfaction.    TEE reviewed Therapeutic INR today   Thompson Grayer

## 2015-07-30 NOTE — Transfer of Care (Signed)
Immediate Anesthesia Transfer of Care Note  Patient: Brandon Quarry Kraner Jr.  Procedure(s) Performed: Procedure(s): Atrial Fibrillation Ablation (N/A)  Patient Location: PACU  Anesthesia Type:General  Level of Consciousness: awake and pateint uncooperative  Airway & Oxygen Therapy: Patient Spontanous Breathing and Patient connected to nasal cannula oxygen  Post-op Assessment: Report given to RN and Post -op Vital signs reviewed and stable  Post vital signs: Reviewed  Last Vitals:  Vitals:   07/30/15 0539 07/30/15 1120  BP: (!) 121/95   Pulse: 88 63  Resp: 18 12  Temp: 36.4 C 36.9 C    Last Pain:  Vitals:   07/30/15 0539  TempSrc: Oral         Complications: No apparent anesthesia complications

## 2015-07-30 NOTE — Progress Notes (Signed)
Site area: Right groin a 7, 9, 11 venous sheath was removed  Site Prior to Removal:  Level 0  Pressure Applied For 15 MINUTES    Bedrest Beginning at 1205p  Manual:   Yes.    Patient Status During Pull:  stable  Post Pull Groin Site:  Level 0  Post Pull Instructions Given:  Yes.    Post Pull Pulses Present:  Yes.    Dressing Applied:  Yes.    Comments:  VS remain stable during sheath pull.

## 2015-07-30 NOTE — Discharge Summary (Signed)
ELECTROPHYSIOLOGY PROCEDURE DISCHARGE SUMMARY    Patient ID: Brandon Rauch.,  MRN: [id number], DOB/AGE: [birth year] 65 y.o.  Admit date: 07/30/2015 Discharge date: 07/31/2015  Primary Care Physician: Nilda Simmer, MD Electrophysiologist: Thompson Grayer, MD  Primary Discharge Diagnosis:  Persistent atrial fibrillation status post ablation this admission  Secondary Discharge Diagnosis:  1.  Hypertension 2.  Colon cancer 3.  Hyperlipidemia 4.  Depression 5.  Obesity 6.  OSA  Procedures This Admission:  1.  Electrophysiology study and radiofrequency catheter ablation on 07/30/15 by Dr Thompson Grayer.  This study demonstrated Atrial fibrillation upon presentation; rotational Angiography reveals a very large sized left atrium with four separate pulmonary veins without evidence of pulmonary vein stenosis; successful electrical isolation and anatomical encircling of all four pulmonary veins with radiofrequency current. A WACA approach was used; additional left atrial ablation was performed to create a standard "box" lesion along the posterior wall; atrial fibrillation successfully cardioverted to sinus rhythm; no early apparent complications..    Brief HPI: Brandon [name]. is a 65 y.o. male with a history of persisent atrial fibrillation.  They have failed medical therapy with amiodarone. Risks, benefits, and alternatives to catheter ablation of atrial fibrillation were reviewed with the patient who wished to proceed.  The patient underwent TEE prior to the procedure which demonstrated normal LV function and no LAA thrombus.    Hospital Course:  The patient was admitted and underwent EPS/RFCA of atrial fibrillation with details as outlined above.  They were monitored on telemetry overnight which demonstrated sinus rhythm with intermittent atrial tachycardia.  Groin was without complication on the day of discharge.  The patient was examined and considered to be stable for discharge.  Wound care and  restrictions were reviewed with the patient.  The patient will be seen back by Roderic Palau, NP in 4 weeks and Dr Rayann Heman in 12 weeks for post ablation follow up.   This patients CHA2DS2-VASc Score and unadjusted Ischemic Stroke Rate (% per year) is equal to 2.2 % stroke rate/year from a score of 2 Above score calculated as 1 point each if present [CHF, HTN, DM, Vascular=MI/PAD/Aortic Plaque, Age if 65-74, or Male] Above score calculated as 2 points each if present [Age > 75, or Stroke/TIA/TE]   Physical Exam: Vitals:   07/31/15 0200 07/31/15 0207 07/31/15 0430 07/31/15 0625  BP: 123/82 123/82  (!) 140/91  Pulse: 70 87 83 74  Resp: (!) 22 17 16 16   Temp:  98 F (36.7 C) 98.3 F (36.8 C)   TempSrc:  Oral Oral   SpO2:  97%  98%  Weight:  251 lb 5.2 oz (114 kg)    Height:        GEN- The patient is well appearing, alert and oriented x 3 today.   HEENT: normocephalic, atraumatic; sclera clear, conjunctiva pink; hearing intact; oropharynx clear; neck supple  Lungs- Clear to ausculation bilaterally, normal work of breathing.  No wheezes, rales, rhonchi Heart- Regular rate and rhythm  GI- soft, non-tender, non-distended, bowel sounds present  Extremities- no clubbing, cyanosis, or edema; DP/PT/radial pulses 2+ bilaterally, groin without hematoma/bruit MS- no significant deformity or atrophy Skin- warm and dry, no rash or lesion Psych- euthymic mood, full affect Neuro- strength and sensation are intact   Labs:   Lab Results  Component Value Date   WBC 6.2 07/23/2015   HGB 15.1 07/23/2015   HCT 44.4 07/23/2015   MCV 92.9 07/23/2015   PLT 198 07/23/2015  Recent Labs Lab 07/31/15 0401  NA 140  K 2.8*  CL 101  CO2 31  BUN 6  CREATININE 0.76  CALCIUM 8.5*  GLUCOSE 107*     Discharge Medications:    Medication List    TAKE these medications   amiodarone 200 MG tablet Commonly known as:  PACERONE Take 1 tablet (200 mg total) by mouth daily.   amLODipine 10 MG  tablet Commonly known as:  NORVASC Take 10 mg by mouth daily.   carvedilol 6.25 MG tablet Commonly known as:  COREG Take 1 tablet (6.25 mg total) by mouth 2 (two) times daily.   cyclobenzaprine 5 MG tablet Commonly known as:  FLEXERIL Take 1 tablet (5 mg total) by mouth every 8 (eight) hours as needed for muscle spasms.   escitalopram 20 MG tablet Commonly known as:  LEXAPRO Take 20 mg by mouth daily.   gabapentin 800 MG tablet Commonly known as:  NEURONTIN TAKE 1 TABLET BY MOUTH DAILY WITH SUPPER AND 1 TABLET AT BEDTIME AS NEEDED.   lidocaine 5 % Commonly known as:  LIDODERM Place 1 patch onto the skin daily as needed (knee pain).   losartan 100 MG tablet Commonly known as:  COZAAR Take 100 mg by mouth daily.   pantoprazole 40 MG tablet Commonly known as:  PROTONIX Take 1 tablet (40 mg total) by mouth daily.   tamsulosin 0.4 MG Caps capsule Commonly known as:  FLOMAX Take 0.4 mg by mouth 2 (two) times daily.   warfarin 5 MG tablet Commonly known as:  COUMADIN Take 5 mg by mouth daily.   warfarin 5 MG tablet Commonly known as:  COUMADIN TAKE AS DIRECTED BY COUMADIN CLINIC       Disposition:   Follow-up Information    [place] Office Follow up on 08/06/2015.   Specialty:  Cardiology Why:  at 9:15AM for coumadin check  Contact information: [address] [place] Follow up on 08/27/2015.   Specialty:  Cardiology Why:  at 10:30AM Contact information: [address] [id number] [place], [place] Follow up on 10/31/2015.   Specialty:  Cardiology Why:  at 9:30AM Contact information: [place] [address] [phone]           Duration of Discharge Encounter: Greater than 30 minutes including physician time.  Signed, Chanetta Marshall,  NP 07/31/2015 7:45 AM   I have seen, examined the patient, and reviewed the above assessment and plan.  On exam, RRR. Changes to above are made where necessary.    Co Sign: Thompson Grayer, MD 07/31/2015 7:23 PM

## 2015-07-30 NOTE — Anesthesia Postprocedure Evaluation (Signed)
Anesthesia Post Note  Patient: Brandon [name].  Procedure(s) Performed: Procedure(s) (LRB): Atrial Fibrillation Ablation (N/A)  Patient location during evaluation: PACU Anesthesia Type: General Level of consciousness: awake and alert Pain management: pain level controlled Vital Signs Assessment: post-procedure vital signs reviewed and stable Respiratory status: spontaneous breathing, nonlabored ventilation, respiratory function stable and patient connected to nasal cannula oxygen Cardiovascular status: blood pressure returned to baseline and stable Postop Assessment: no signs of nausea or vomiting Anesthetic complications: no    Last Vitals:  Vitals:   07/30/15 2030 07/30/15 2055  BP: (!) 163/120 (!) 145/87  Pulse: (!) 130 86  Resp: 18 14  Temp:      Last Pain:  Vitals:   07/30/15 1942  TempSrc: Oral  PainSc:                  Pollyanna Levay,W. EDMOND

## 2015-07-30 NOTE — Anesthesia Procedure Notes (Addendum)
Procedure Name: Intubation Date/Time: 07/30/2015 7:54 AM Performed by: Jenne Campus Pre-anesthesia Checklist: Patient identified, Emergency Drugs available, Suction available and Patient being monitored Patient Re-evaluated:Patient Re-evaluated prior to inductionOxygen Delivery Method: Circle System Utilized Preoxygenation: Pre-oxygenation with 100% oxygen Intubation Type: IV induction Laryngoscope Size: Miller and 3 Grade View: Grade I Tube type: Oral Tube size: 8.0 mm Number of attempts: 1 Airway Equipment and Method: Stylet Placement Confirmation: ETT inserted through vocal cords under direct vision,  positive ETCO2 and breath sounds checked- equal and bilateral Secured at: 24 cm Tube secured with: Tape Dental Injury: Teeth and Oropharynx as per pre-operative assessment

## 2015-07-30 NOTE — Progress Notes (Signed)
4

## 2015-07-30 NOTE — H&P (View-Only) (Signed)
Electrophysiology Office Note   Date:  07/03/2015   ID:  Brandon [name]., DOB [birth year], MRN [id number]  PCP:  Nilda Simmer, MD   Primary Electrophysiologist: Thompson Grayer, MD    Chief Complaint  Patient presents with  . Atrial Fibrillation     History of Present Illness: Brandon [name]. is a 65 y.o. male who presents today for electrophysiology evaluation.  He appears to have returned to afib since at least February.  He has failed medical therapy with amiodarone and recent cardioversion was unsuccessful.  Nausea with increased amiodarone.  Frustrated by [employer].  He has fatigue and decreased exercise tolerance as symptoms. Today, he denies symptoms of chest pain, shortness of breath, orthopnea, PND, lower extremity edema, claudication, dizziness, presyncope, syncope, bleeding, or neurologic sequela. The patient is tolerating medications without difficulties and is otherwise without complaint today.    Past Medical History  Diagnosis Date  . Arthritis   . Sciatica   . Anxiety   . Hypertension   . Osteoporosis   . Cataract   . Persistent atrial fibrillation ([place])   . Obesity   . Bilateral pulmonary embolism ([place]) 3/14    admitted to [facility],  treated with Xarelto  . Chronic pain   . Thrombus of left atrial appendage 03/09/2013  . Mitral regurgitation 04/24/2013    Mild by TEE  . Benign neoplasm of colon   . Benign prostatic hyperplasia with urinary obstruction   . Hyperlipemia   . Insomnia   . Major depressive disorder, recurrent episode ([place])   . Ventral hernia, unspecified, without mention of obstruction or gangrene     right abdominal wall  . Restless leg syndrome     takes gabapentin  . OSA (obstructive sleep apnea)     noncompliant with CPAP.  07/27/13- awaiting a CPAP,   Past Surgical History  Procedure Laterality Date  . Knee arthroscopy      left  . Colonoscopy w/ polypectomy    . Rotator cuff repair      right  . Tonsillectomy    . Cataract extraction    .  Hernia repair  07/06/2011  . Replacement total knee Left   . Tee without cardioversion N/A 03/21/2012    Procedure: TRANSESOPHAGEAL ECHOCARDIOGRAM (TEE);  Surgeon: Birdie Riddle, MD;  Location: [place];  Service: Cardiovascular;  Laterality: N/A;  . Cardioversion N/A 03/21/2012    Procedure: CARDIOVERSION;  Surgeon: Birdie Riddle, MD;  Location: [facility] ENDOSCOPY;  Service: Cardiovascular;  Laterality: N/A;  . Tee without cardioversion N/A 04/24/2013    Procedure: TRANSESOPHAGEAL ECHOCARDIOGRAM (TEE);  Surgeon: Dorothy Spark, MD;  Location: [place];  Service: Cardiovascular;  Laterality: N/A;  . Cardioversion N/A 04/24/2013    Procedure: CARDIOVERSION;  Surgeon: Dorothy Spark, MD;  Location: [place];  Service: Cardiovascular;  Laterality: N/A;  . Eye surgery Right     cataract  . Femur fracture surgery    . Inguinal hernia repair Right 07/28/2013    Procedure: RIGHT INGUINAL HERNIA REPAIR;  Surgeon: Imogene Burn. Georgette Dover, MD;  Location: [place];  Service: General;  Laterality: Right;  . Insertion of mesh Right 07/28/2013    Procedure: INSERTION OF MESH;  Surgeon: Imogene Burn. Georgette Dover, MD;  Location: [place];  Service: General;  Laterality: Right;  . Rotator cuff repair Left 03/08/2014    DR SUPPLE  . Shoulder arthroscopy with rotator cuff repair and subacromial decompression Left 03/08/2014    Procedure: LEFT SHOULDER ARTHROSCOPY WITH  ROTATOR CUFF REPAIR/SUBACROMIAL DECOMPRESSION/DISTAL CLAVICLE RESECTION;  Surgeon: Marin Shutter, MD;  Location: [place];  Service: Orthopedics;  Laterality: Left;  . Cardioversion N/A 06/27/2015    Procedure: CARDIOVERSION;  Surgeon: Larey Dresser, MD;  Location: [place];  Service: Cardiovascular;  Laterality: N/A;     Current Outpatient Prescriptions  Medication Sig Dispense Refill  . amiodarone (PACERONE) 200 MG tablet Take 1 tablet (200 mg total) by mouth 2 (two) times daily. 60 tablet 1  . amLODipine (NORVASC) 10 MG tablet Take 10 mg by mouth daily.     . cyclobenzaprine (FLEXERIL) 5 MG tablet Take 1 tablet (5 mg total) by mouth every 8 (eight) hours as needed for muscle spasms. 90 tablet 2  . escitalopram (LEXAPRO) 20 MG tablet Take 20 mg by mouth daily.    Marland Kitchen gabapentin (NEURONTIN) 800 MG tablet Take 1 tablet (800 mg total) by mouth See admin instructions. Takes 1 tablet by mouth daily with supper, and take 1 tablet at bedtime as needed 60 tablet 11  . lidocaine (LIDODERM) 5 % Place 1 patch onto the skin daily as needed (knee pain).     Marland Kitchen losartan (COZAAR) 100 MG tablet Take 100 mg by mouth daily.    . tamsulosin (FLOMAX) 0.4 MG CAPS capsule Take 0.4 mg by mouth 2 (two) times daily.     Marland Kitchen warfarin (COUMADIN) 5 MG tablet TAKE AS DIRECTED BY COUMADIN CLINIC 30 tablet 3   No current facility-administered medications for this visit.    Allergies:   Ace inhibitors   Social History:  The patient  reports that he has quit smoking. He has never used smokeless tobacco. He reports that he drinks alcohol. He reports that he does not use illicit drugs.   Family History:  The patient's  family history includes CVA in his other; Cancer in his father and paternal grandmother; Diabetes in his other.    ROS:  Please see the history of present illness.   All other systems are reviewed and negative.    PHYSICAL EXAM: VS:  BP 133/73 mmHg  Pulse 89  Ht 6\' 4"  (1.93 m)  Wt 257 lb 9.6 oz (116.847 kg)  BMI 31.37 kg/m2 , BMI Body mass index is 31.37 kg/(m^2). GEN: Well nourished, well developed, in no acute distress HEENT: normal Neck: no JVD, carotid bruits, or masses Cardiac: tachycardic irregular rhythm; no murmurs, rubs, or gallops,no edema  Respiratory:  clear to auscultation bilaterally, normal work of breathing GI: soft, nontender, nondistended, + BS MS: no deformity or atrophy Skin: warm and dry  Neuro:  Strength and sensation are intact Psych: euthymic mood, full affect  EKG:  EKG is ordered today. The ekg ordered today shows afib, V rate  105 bpm. LAHB   Recent Labs: 06/05/2015: ALT 13; TSH 2.07 06/27/2015: BUN 20; Creatinine, Ser 1.08; Hemoglobin 14.1; Platelets 157; Potassium 3.0*; Sodium 141    Lipid Panel     Component Value Date/Time   CHOL 170 03/17/2012 0432   TRIG 117 03/17/2012 0432   HDL 30* 03/17/2012 0432   CHOLHDL 5.7 03/17/2012 0432   VLDL 23 03/17/2012 0432   LDLCALC 117* 03/17/2012 0432     Wt Readings from Last 3 Encounters:  07/03/15 257 lb 9.6 oz (116.847 kg)  06/27/15 260 lb (117.935 kg)  06/05/15 212 lb (96.163 kg)    Other studies Reviewed: Additional studies/ records that were reviewed today include: personally reviewed echo images today,  EF about 45-50%, mild MR, LA size  50 mm   ASSESSMENT AND PLAN:  1.  Persistent afib Recurrent symptomatic persistent afib.  Failed medical therapy with amiodarone. Therapeutic strategies for afib including medicine and ablation were discussed in detail with the patient today. Risk, benefits, and alternatives to EP study and radiofrequency ablation for afib were also discussed in detail today. These risks include but are not limited to stroke, bleeding, vascular damage, tamponade, perforation, damage to the esophagus, lungs, and other structures, pulmonary vein stenosis, worsening renal function, and death. The patient understands these risk and wishes to proceed.  We will therefore proceed with catheter ablation at the next available time.  Given prior LA thrombus, will follow weekly INRs and obtain TEE prior to ablation Reduced amiodarone to 200mg  daily today Add coreg  2. HTN Stable No change required today  3. OSA Stable No change required today  Current medicines are reviewed at length with the patient today.   The patient does not have concerns regarding his medicines.  The following changes were made today:  none  Signed, Thompson Grayer, MD  07/03/2015 10:25 AM     [facility] [address] [place]  [phone] (office) [phone] (fax)

## 2015-07-31 ENCOUNTER — Telehealth: Payer: Self-pay | Admitting: Internal Medicine

## 2015-07-31 ENCOUNTER — Encounter (HOSPITAL_COMMUNITY): Payer: Self-pay | Admitting: Internal Medicine

## 2015-07-31 DIAGNOSIS — I481 Persistent atrial fibrillation: Secondary | ICD-10-CM

## 2015-07-31 DIAGNOSIS — G4733 Obstructive sleep apnea (adult) (pediatric): Secondary | ICD-10-CM

## 2015-07-31 DIAGNOSIS — M1990 Unspecified osteoarthritis, unspecified site: Secondary | ICD-10-CM | POA: Diagnosis not present

## 2015-07-31 DIAGNOSIS — M810 Age-related osteoporosis without current pathological fracture: Secondary | ICD-10-CM | POA: Diagnosis not present

## 2015-07-31 DIAGNOSIS — C189 Malignant neoplasm of colon, unspecified: Secondary | ICD-10-CM | POA: Diagnosis not present

## 2015-07-31 DIAGNOSIS — E669 Obesity, unspecified: Secondary | ICD-10-CM | POA: Diagnosis not present

## 2015-07-31 DIAGNOSIS — M5430 Sciatica, unspecified side: Secondary | ICD-10-CM | POA: Diagnosis not present

## 2015-07-31 DIAGNOSIS — E785 Hyperlipidemia, unspecified: Secondary | ICD-10-CM | POA: Diagnosis not present

## 2015-07-31 DIAGNOSIS — F419 Anxiety disorder, unspecified: Secondary | ICD-10-CM | POA: Diagnosis not present

## 2015-07-31 DIAGNOSIS — I10 Essential (primary) hypertension: Secondary | ICD-10-CM | POA: Diagnosis not present

## 2015-07-31 DIAGNOSIS — Z86711 Personal history of pulmonary embolism: Secondary | ICD-10-CM | POA: Diagnosis not present

## 2015-07-31 DIAGNOSIS — F329 Major depressive disorder, single episode, unspecified: Secondary | ICD-10-CM | POA: Diagnosis not present

## 2015-07-31 LAB — BASIC METABOLIC PANEL
Anion gap: 8 (ref 5–15)
BUN: 6 mg/dL (ref 6–20)
CO2: 31 mmol/L (ref 22–32)
Calcium: 8.5 mg/dL — ABNORMAL LOW (ref 8.9–10.3)
Chloride: 101 mmol/L (ref 101–111)
Creatinine, Ser: 0.76 mg/dL (ref 0.61–1.24)
GFR calc Af Amer: >60 mL/min (ref >60)
GFR calc non Af Amer: >60 mL/min (ref >60)
Glucose, Bld: 107 mg/dL — ABNORMAL HIGH (ref 65–99)
Potassium: 2.8 mmol/L — ABNORMAL LOW (ref 3.5–5.1)
Sodium: 140 mmol/L (ref 135–145)

## 2015-07-31 LAB — PROTIME-INR
INR: 2.37
Prothrombin Time: 25.6 seconds — ABNORMAL HIGH (ref 11.4–15.2)

## 2015-07-31 MED ORDER — PANTOPRAZOLE SODIUM 40 MG PO TBEC: 40.0000 mg | DELAYED_RELEASE_TABLET | Freq: Every day | ORAL | 0 refills | Status: DC

## 2015-07-31 MED ORDER — POTASSIUM CHLORIDE CRYS ER 20 MEQ PO TBCR
40.0000 meq | EXTENDED_RELEASE_TABLET | Freq: Once | ORAL | Status: AC
  Administered 2015-07-31: 40 meq via ORAL
  Filled 2015-07-31: qty 2

## 2015-07-31 NOTE — Telephone Encounter (Signed)
Discussed with Dr Rayann Heman and returned call to patient.  These symptoms occur when he gets up.  He has ringing in ears, room starts to spin, and he feels nauseated.  If he is lying down does not have symptoms.  INR is good at 2.37.  Will have him call his PCP to discuss possible vertigo.  If no better can call the afib clinic for follow up

## 2015-07-31 NOTE — Telephone Encounter (Signed)
New Message  Pt call requesting to speak with RN. Pt states he had a recent ablation preformed and is experiencing some dizziness and wants to know if this is normal after procedure. Please call back to discuss

## 2015-08-06 ENCOUNTER — Other Ambulatory Visit (INDEPENDENT_AMBULATORY_CARE_PROVIDER_SITE_OTHER): Payer: Medicare Other | Admitting: *Deleted

## 2015-08-06 ENCOUNTER — Ambulatory Visit (INDEPENDENT_AMBULATORY_CARE_PROVIDER_SITE_OTHER): Payer: Medicare Other | Admitting: *Deleted

## 2015-08-06 DIAGNOSIS — I4819 Other persistent atrial fibrillation: Secondary | ICD-10-CM

## 2015-08-06 DIAGNOSIS — Z5181 Encounter for therapeutic drug level monitoring: Secondary | ICD-10-CM

## 2015-08-06 DIAGNOSIS — I10 Essential (primary) hypertension: Secondary | ICD-10-CM

## 2015-08-06 DIAGNOSIS — I480 Paroxysmal atrial fibrillation: Secondary | ICD-10-CM

## 2015-08-06 DIAGNOSIS — I481 Persistent atrial fibrillation: Secondary | ICD-10-CM

## 2015-08-06 LAB — BASIC METABOLIC PANEL
BUN: 21 mg/dL (ref 7–25)
CO2: 30 mmol/L (ref 20–31)
Calcium: 8.8 mg/dL (ref 8.6–10.3)
Chloride: 102 mmol/L (ref 98–110)
Creat: 1.02 mg/dL (ref 0.70–1.25)
Glucose, Bld: 89 mg/dL (ref 65–99)
Potassium: 3.4 mmol/L — ABNORMAL LOW (ref 3.5–5.3)
Sodium: 139 mmol/L (ref 135–146)

## 2015-08-06 LAB — POCT INR: INR: 3.3

## 2015-08-06 NOTE — Addendum Note (Signed)
Addended by: Eulis Foster on: 08/06/2015 09:27 AM   Modules accepted: Orders

## 2015-08-08 DIAGNOSIS — Z6831 Body mass index (BMI) 31.0-31.9, adult: Secondary | ICD-10-CM | POA: Diagnosis not present

## 2015-08-08 DIAGNOSIS — I10 Essential (primary) hypertension: Secondary | ICD-10-CM | POA: Diagnosis not present

## 2015-08-08 DIAGNOSIS — F411 Generalized anxiety disorder: Secondary | ICD-10-CM | POA: Diagnosis not present

## 2015-08-09 ENCOUNTER — Telehealth: Payer: Self-pay | Admitting: *Deleted

## 2015-08-09 DIAGNOSIS — Z471 Aftercare following joint replacement surgery: Secondary | ICD-10-CM | POA: Diagnosis not present

## 2015-08-09 DIAGNOSIS — Z96652 Presence of left artificial knee joint: Secondary | ICD-10-CM | POA: Diagnosis not present

## 2015-08-09 DIAGNOSIS — M79652 Pain in left thigh: Secondary | ICD-10-CM | POA: Diagnosis not present

## 2015-08-09 DIAGNOSIS — M25562 Pain in left knee: Secondary | ICD-10-CM | POA: Diagnosis not present

## 2015-08-09 NOTE — Telephone Encounter (Signed)
-----   Message from Patsey Berthold, NP sent at 08/07/2015  9:41 AM EDT ----- Please notify patient of lab results.  K improved.

## 2015-08-09 NOTE — Telephone Encounter (Signed)
SPOKE TO PT ABOUT RESULTS AND VERBALIZED UNDERSTANDING  

## 2015-08-20 ENCOUNTER — Ambulatory Visit (INDEPENDENT_AMBULATORY_CARE_PROVIDER_SITE_OTHER): Payer: Medicare Other

## 2015-08-20 DIAGNOSIS — I480 Paroxysmal atrial fibrillation: Secondary | ICD-10-CM | POA: Diagnosis not present

## 2015-08-20 DIAGNOSIS — I481 Persistent atrial fibrillation: Secondary | ICD-10-CM

## 2015-08-20 DIAGNOSIS — Z5181 Encounter for therapeutic drug level monitoring: Secondary | ICD-10-CM

## 2015-08-20 DIAGNOSIS — I4819 Other persistent atrial fibrillation: Secondary | ICD-10-CM

## 2015-08-20 LAB — POCT INR: INR: 2.8

## 2015-08-27 ENCOUNTER — Ambulatory Visit (HOSPITAL_COMMUNITY)
Admit: 2015-08-27 | Discharge: 2015-08-27 | Disposition: A | Discharge status: Home / Self Care | Payer: Medicare Other | Source: Ambulatory Visit | Attending: Nurse Practitioner | Admitting: Nurse Practitioner

## 2015-08-27 ENCOUNTER — Encounter (HOSPITAL_COMMUNITY): Payer: Self-pay | Admitting: Nurse Practitioner

## 2015-08-27 VITALS — BP 136/84 | HR 70 | Ht 76.0 in | Wt 254.6 lb

## 2015-08-27 DIAGNOSIS — E785 Hyperlipidemia, unspecified: Secondary | ICD-10-CM | POA: Insufficient documentation

## 2015-08-27 DIAGNOSIS — Z888 Allergy status to other drugs, medicaments and biological substances status: Secondary | ICD-10-CM | POA: Insufficient documentation

## 2015-08-27 DIAGNOSIS — Z7901 Long term (current) use of anticoagulants: Secondary | ICD-10-CM | POA: Diagnosis not present

## 2015-08-27 DIAGNOSIS — M1990 Unspecified osteoarthritis, unspecified site: Secondary | ICD-10-CM | POA: Diagnosis not present

## 2015-08-27 DIAGNOSIS — Z87891 Personal history of nicotine dependence: Secondary | ICD-10-CM | POA: Diagnosis not present

## 2015-08-27 DIAGNOSIS — G8929 Other chronic pain: Secondary | ICD-10-CM | POA: Diagnosis not present

## 2015-08-27 DIAGNOSIS — G2581 Restless legs syndrome: Secondary | ICD-10-CM | POA: Insufficient documentation

## 2015-08-27 DIAGNOSIS — E669 Obesity, unspecified: Secondary | ICD-10-CM | POA: Insufficient documentation

## 2015-08-27 DIAGNOSIS — I10 Essential (primary) hypertension: Secondary | ICD-10-CM | POA: Diagnosis not present

## 2015-08-27 DIAGNOSIS — I4891 Unspecified atrial fibrillation: Secondary | ICD-10-CM | POA: Diagnosis present

## 2015-08-27 DIAGNOSIS — F419 Anxiety disorder, unspecified: Secondary | ICD-10-CM | POA: Insufficient documentation

## 2015-08-27 DIAGNOSIS — I480 Paroxysmal atrial fibrillation: Secondary | ICD-10-CM | POA: Insufficient documentation

## 2015-08-27 DIAGNOSIS — Z79899 Other long term (current) drug therapy: Secondary | ICD-10-CM | POA: Insufficient documentation

## 2015-08-27 DIAGNOSIS — I340 Nonrheumatic mitral (valve) insufficiency: Secondary | ICD-10-CM | POA: Diagnosis not present

## 2015-08-27 DIAGNOSIS — Z6830 Body mass index (BMI) 30.0-30.9, adult: Secondary | ICD-10-CM | POA: Insufficient documentation

## 2015-08-27 DIAGNOSIS — Z86711 Personal history of pulmonary embolism: Secondary | ICD-10-CM | POA: Insufficient documentation

## 2015-08-27 DIAGNOSIS — G4733 Obstructive sleep apnea (adult) (pediatric): Secondary | ICD-10-CM | POA: Insufficient documentation

## 2015-08-27 NOTE — Progress Notes (Signed)
Patient ID: Brandon [name]., male   DOB: 10-23-1950, 65 y.o.   MRN: [id number]     Primary Care Physician: Nilda Simmer, MD Referring Physician: Dr. Raynaldo Opitz Friberg [name] is a 65 y.o. male with a h/o PAF on amiodarone in the afib clinic for f/u recent ablation, 07/30/15, for persistent afib. He reports that he had some groin soreness earlier on but has resolved. No swallowing difficulties.In SR today and has not felt any afib. He has been inactive to make sure he did not mess up results form procedure but was encouraged to return to normal activities. Is on warfarin  with INR of 2.8 8/15.  Today, he denies symptoms of palpitations, chest pain, shortness of breath, orthopnea, PND, lower extremity edema, dizziness, presyncope, syncope, or neurologic sequela. The patient is tolerating medications without difficulties and is otherwise without complaint today.   Past Medical History:  Diagnosis Date  . Anxiety   . Arthritis   . Benign neoplasm of colon   . Benign prostatic hyperplasia with urinary obstruction   . Bilateral pulmonary embolism ([place]) 3/14   admitted to [facility],  treated with Xarelto  . Cataract   . Chronic pain   . Hyperlipemia   . Hypertension   . Insomnia   . Major depressive disorder, recurrent episode ([place])   . Mitral regurgitation 04/24/2013   Mild by TEE  . Obesity   . OSA (obstructive sleep apnea)    noncompliant with CPAP.  07/27/13- awaiting a CPAP,  . Osteoporosis   . Persistent atrial fibrillation ([place])   . Restless leg syndrome    takes gabapentin  . Sciatica   . Thrombus of left atrial appendage 03/09/2013  . Ventral hernia, unspecified, without mention of obstruction or gangrene    right abdominal wall   Past Surgical History:  Procedure Laterality Date  . CARDIOVERSION N/A 03/21/2012   Procedure: CARDIOVERSION;  Surgeon: Birdie Riddle, MD;  Location: [facility] ENDOSCOPY;  Service: Cardiovascular;  Laterality: N/A;  . CARDIOVERSION N/A 04/24/2013   Procedure:  CARDIOVERSION;  Surgeon: Dorothy Spark, MD;  Location: [facility] ENDOSCOPY;  Service: Cardiovascular;  Laterality: N/A;  . CARDIOVERSION N/A 06/27/2015   Procedure: CARDIOVERSION;  Surgeon: Larey Dresser, MD;  Location: [place];  Service: Cardiovascular;  Laterality: N/A;  . CATARACT EXTRACTION    . COLONOSCOPY W/ POLYPECTOMY    . ELECTROPHYSIOLOGIC STUDY N/A 07/30/2015   Procedure: Atrial Fibrillation Ablation;  Surgeon: Thompson Grayer, MD;  Location: [place] CV LAB;  Service: Cardiovascular;  Laterality: N/A;  . EYE SURGERY Right    cataract  . FEMUR FRACTURE SURGERY    . HERNIA REPAIR  07/06/2011  . INGUINAL HERNIA REPAIR Right 07/28/2013   Procedure: RIGHT INGUINAL HERNIA REPAIR;  Surgeon: Imogene Burn. Georgette Dover, MD;  Location: [place];  Service: General;  Laterality: Right;  . INSERTION OF MESH Right 07/28/2013   Procedure: INSERTION OF MESH;  Surgeon: Imogene Burn. Georgette Dover, MD;  Location: [place];  Service: General;  Laterality: Right;  . KNEE ARTHROSCOPY     left  . REPLACEMENT TOTAL KNEE Left   . ROTATOR CUFF REPAIR     right  . ROTATOR CUFF REPAIR Left 03/08/2014   DR SUPPLE  . SHOULDER ARTHROSCOPY WITH ROTATOR CUFF REPAIR AND SUBACROMIAL DECOMPRESSION Left 03/08/2014   Procedure: LEFT SHOULDER ARTHROSCOPY WITH ROTATOR CUFF REPAIR/SUBACROMIAL DECOMPRESSION/DISTAL CLAVICLE RESECTION;  Surgeon: Marin Shutter, MD;  Location: [place];  Service: Orthopedics;  Laterality: Left;  .  TEE WITHOUT CARDIOVERSION N/A 03/21/2012   Procedure: TRANSESOPHAGEAL ECHOCARDIOGRAM (TEE);  Surgeon: Birdie Riddle, MD;  Location: [place];  Service: Cardiovascular;  Laterality: N/A;  . TEE WITHOUT CARDIOVERSION N/A 04/24/2013   Procedure: TRANSESOPHAGEAL ECHOCARDIOGRAM (TEE);  Surgeon: Dorothy Spark, MD;  Location: [place];  Service: Cardiovascular;  Laterality: N/A;  . TEE WITHOUT CARDIOVERSION N/A 07/29/2015   Procedure: TRANSESOPHAGEAL ECHOCARDIOGRAM (TEE);  Surgeon: Fay Records, MD;  Location: [facility]  ENDOSCOPY;  Service: Cardiovascular;  Laterality: N/A;  . TONSILLECTOMY      Current Outpatient Prescriptions  Medication Sig Dispense Refill  . amiodarone (PACERONE) 200 MG tablet Take 1 tablet (200 mg total) by mouth daily. 90 tablet 1  . amLODipine (NORVASC) 10 MG tablet Take 10 mg by mouth daily.    . carvedilol (COREG) 6.25 MG tablet Take 1 tablet (6.25 mg total) by mouth 2 (two) times daily. 180 tablet 3  . cyclobenzaprine (FLEXERIL) 5 MG tablet Take 1 tablet (5 mg total) by mouth every 8 (eight) hours as needed for muscle spasms. 90 tablet 2  . escitalopram (LEXAPRO) 20 MG tablet Take 20 mg by mouth daily.    Marland Kitchen gabapentin (NEURONTIN) 800 MG tablet TAKE 1 TABLET BY MOUTH DAILY WITH SUPPER AND 1 TABLET AT BEDTIME AS NEEDED. 60 tablet 11  . lidocaine (LIDODERM) 5 % Place 1 patch onto the skin daily as needed (knee pain).     Marland Kitchen losartan (COZAAR) 100 MG tablet Take 100 mg by mouth daily.    . pantoprazole (PROTONIX) 40 MG tablet Take 1 tablet (40 mg total) by mouth daily. 45 tablet 0  . tamsulosin (FLOMAX) 0.4 MG CAPS capsule Take 0.4 mg by mouth 2 (two) times daily.     Marland Kitchen warfarin (COUMADIN) 5 MG tablet TAKE AS DIRECTED BY COUMADIN CLINIC 30 tablet 3   No current facility-administered medications for this encounter.     Allergies  Allergen Reactions  . Ace Inhibitors Cough    Social History   Social History  . Marital status: Married    Spouse name: N/A  . Number of children: N/A  . Years of education: N/A   Occupational History  . Not on file.   Social History Main Topics  . Smoking status: Former Research scientist (life sciences)  . Smokeless tobacco: Never Used  . Alcohol use 0.0 oz/week     Comment: 2 drinks per night  . Drug use: No     Comment: negative hx for IV drug abuse  . Sexual activity: Not on file   Other Topics Concern  . Not on file   Social History Narrative   Lives with wife in [place]   Retired Dietitian    Family History  Problem Relation Age of  Onset  . Cancer Father     bone  . Cancer Paternal Grandmother     ovarian  . CVA Other     Fam Hx of multiple myeloma  . Diabetes Other     Fam Hx of DM    ROS- All systems are reviewed and negative except as per the HPI above  Physical Exam: Vitals:   08/27/15 1030  BP: 136/84  Pulse: 70  Weight: 254 lb 9.6 oz (115.5 kg)  Height: 6' 4" (1.93 m)    GEN- The patient is well appearing, alert and oriented x 3 today.   Head- normocephalic, atraumatic Eyes-  Sclera clear, conjunctiva pink Ears- hearing intact Oropharynx- clear Neck- supple, no JVP  Lymph- no cervical lymphadenopathy Lungs- Clear to ausculation bilaterally, normal work of breathing Heart- regular rate and rhythm, no murmurs, rubs or gallops, PMI not laterally displaced GI- soft, NT, ND, + BS Extremities- no clubbing, cyanosis, or edema MS- no significant deformity or atrophy Skin- no rash or lesion Psych- euthymic mood, full affect Neuro- strength and sensation are intact  EKG- SR at 70 bpm, LAFB pr int 216 ms, qrs int 114 ms, qtc 477 ms Epic records reviewed  Assessment and Plan: 1.PAF S/p ablation x one month and doing well Continue warfarin Continue amiodarone /carvedilol Encouraged to return to normal activities  2. HTN Stable   F/u with Dr. Rayann Heman as scheduled 10/26 afib clinic as needed  Butch Penny C. Serenitie Vinton, [facility] [address] [place], [place] [address] [phone]

## 2015-08-29 DIAGNOSIS — M1612 Unilateral primary osteoarthritis, left hip: Secondary | ICD-10-CM | POA: Diagnosis not present

## 2015-09-03 DIAGNOSIS — H43813 Vitreous degeneration, bilateral: Secondary | ICD-10-CM | POA: Diagnosis not present

## 2015-09-03 DIAGNOSIS — H26493 Other secondary cataract, bilateral: Secondary | ICD-10-CM | POA: Diagnosis not present

## 2015-09-03 DIAGNOSIS — Z961 Presence of intraocular lens: Secondary | ICD-10-CM | POA: Diagnosis not present

## 2015-09-17 ENCOUNTER — Ambulatory Visit (INDEPENDENT_AMBULATORY_CARE_PROVIDER_SITE_OTHER): Payer: Medicare Other | Admitting: *Deleted

## 2015-09-17 DIAGNOSIS — Z5181 Encounter for therapeutic drug level monitoring: Secondary | ICD-10-CM | POA: Diagnosis not present

## 2015-09-17 DIAGNOSIS — I4819 Other persistent atrial fibrillation: Secondary | ICD-10-CM

## 2015-09-17 DIAGNOSIS — I480 Paroxysmal atrial fibrillation: Secondary | ICD-10-CM | POA: Diagnosis not present

## 2015-09-17 DIAGNOSIS — I481 Persistent atrial fibrillation: Secondary | ICD-10-CM

## 2015-09-17 LAB — POCT INR: INR: 4.1

## 2015-09-30 DIAGNOSIS — H01003 Unspecified blepharitis right eye, unspecified eyelid: Secondary | ICD-10-CM | POA: Diagnosis not present

## 2015-09-30 DIAGNOSIS — H26491 Other secondary cataract, right eye: Secondary | ICD-10-CM | POA: Diagnosis not present

## 2015-09-30 DIAGNOSIS — H26492 Other secondary cataract, left eye: Secondary | ICD-10-CM | POA: Diagnosis not present

## 2015-09-30 DIAGNOSIS — Z961 Presence of intraocular lens: Secondary | ICD-10-CM | POA: Diagnosis not present

## 2015-10-01 ENCOUNTER — Ambulatory Visit (INDEPENDENT_AMBULATORY_CARE_PROVIDER_SITE_OTHER): Payer: Medicare Other | Admitting: *Deleted

## 2015-10-01 DIAGNOSIS — I481 Persistent atrial fibrillation: Secondary | ICD-10-CM | POA: Diagnosis not present

## 2015-10-01 DIAGNOSIS — Z5181 Encounter for therapeutic drug level monitoring: Secondary | ICD-10-CM

## 2015-10-01 DIAGNOSIS — I480 Paroxysmal atrial fibrillation: Secondary | ICD-10-CM

## 2015-10-01 DIAGNOSIS — I4819 Other persistent atrial fibrillation: Secondary | ICD-10-CM

## 2015-10-01 LAB — POCT INR: INR: 2.7

## 2015-10-15 DIAGNOSIS — H26491 Other secondary cataract, right eye: Secondary | ICD-10-CM | POA: Diagnosis not present

## 2015-10-21 DIAGNOSIS — R0602 Shortness of breath: Secondary | ICD-10-CM | POA: Diagnosis not present

## 2015-10-21 DIAGNOSIS — Z6830 Body mass index (BMI) 30.0-30.9, adult: Secondary | ICD-10-CM | POA: Diagnosis not present

## 2015-10-22 ENCOUNTER — Ambulatory Visit (INDEPENDENT_AMBULATORY_CARE_PROVIDER_SITE_OTHER): Payer: Medicare Other | Admitting: *Deleted

## 2015-10-22 DIAGNOSIS — I481 Persistent atrial fibrillation: Secondary | ICD-10-CM

## 2015-10-22 DIAGNOSIS — I480 Paroxysmal atrial fibrillation: Secondary | ICD-10-CM | POA: Diagnosis not present

## 2015-10-22 DIAGNOSIS — I4819 Other persistent atrial fibrillation: Secondary | ICD-10-CM

## 2015-10-22 DIAGNOSIS — Z5181 Encounter for therapeutic drug level monitoring: Secondary | ICD-10-CM

## 2015-10-22 LAB — POCT INR: INR: 3.1

## 2015-10-23 ENCOUNTER — Observation Stay (HOSPITAL_COMMUNITY)
Admission: EM | Admit: 2015-10-23 | Discharge: 2015-10-24 | Disposition: A | Discharge status: Home / Self Care | Payer: Medicare Other | Attending: Internal Medicine | Admitting: Internal Medicine

## 2015-10-23 ENCOUNTER — Emergency Department (HOSPITAL_COMMUNITY): Payer: Medicare Other

## 2015-10-23 ENCOUNTER — Encounter (HOSPITAL_COMMUNITY): Payer: Self-pay

## 2015-10-23 DIAGNOSIS — R0609 Other forms of dyspnea: Secondary | ICD-10-CM | POA: Diagnosis present

## 2015-10-23 DIAGNOSIS — I340 Nonrheumatic mitral (valve) insufficiency: Secondary | ICD-10-CM | POA: Diagnosis not present

## 2015-10-23 DIAGNOSIS — J40 Bronchitis, not specified as acute or chronic: Secondary | ICD-10-CM | POA: Diagnosis not present

## 2015-10-23 DIAGNOSIS — R0902 Hypoxemia: Secondary | ICD-10-CM | POA: Insufficient documentation

## 2015-10-23 DIAGNOSIS — G4733 Obstructive sleep apnea (adult) (pediatric): Secondary | ICD-10-CM | POA: Insufficient documentation

## 2015-10-23 DIAGNOSIS — Z86711 Personal history of pulmonary embolism: Secondary | ICD-10-CM | POA: Diagnosis not present

## 2015-10-23 DIAGNOSIS — I444 Left anterior fascicular block: Secondary | ICD-10-CM | POA: Insufficient documentation

## 2015-10-23 DIAGNOSIS — I4891 Unspecified atrial fibrillation: Secondary | ICD-10-CM

## 2015-10-23 DIAGNOSIS — I481 Persistent atrial fibrillation: Secondary | ICD-10-CM | POA: Insufficient documentation

## 2015-10-23 DIAGNOSIS — Z6830 Body mass index (BMI) 30.0-30.9, adult: Secondary | ICD-10-CM | POA: Insufficient documentation

## 2015-10-23 DIAGNOSIS — I480 Paroxysmal atrial fibrillation: Secondary | ICD-10-CM | POA: Diagnosis not present

## 2015-10-23 DIAGNOSIS — Z808 Family history of malignant neoplasm of other organs or systems: Secondary | ICD-10-CM | POA: Insufficient documentation

## 2015-10-23 DIAGNOSIS — N401 Enlarged prostate with lower urinary tract symptoms: Secondary | ICD-10-CM | POA: Diagnosis not present

## 2015-10-23 DIAGNOSIS — I491 Atrial premature depolarization: Secondary | ICD-10-CM | POA: Diagnosis not present

## 2015-10-23 DIAGNOSIS — K4020 Bilateral inguinal hernia, without obstruction or gangrene, not specified as recurrent: Secondary | ICD-10-CM | POA: Insufficient documentation

## 2015-10-23 DIAGNOSIS — E876 Hypokalemia: Secondary | ICD-10-CM | POA: Diagnosis not present

## 2015-10-23 DIAGNOSIS — E785 Hyperlipidemia, unspecified: Secondary | ICD-10-CM | POA: Insufficient documentation

## 2015-10-23 DIAGNOSIS — G4700 Insomnia, unspecified: Secondary | ICD-10-CM | POA: Diagnosis not present

## 2015-10-23 DIAGNOSIS — Z8041 Family history of malignant neoplasm of ovary: Secondary | ICD-10-CM | POA: Insufficient documentation

## 2015-10-23 DIAGNOSIS — F419 Anxiety disorder, unspecified: Secondary | ICD-10-CM | POA: Diagnosis not present

## 2015-10-23 DIAGNOSIS — I110 Hypertensive heart disease with heart failure: Secondary | ICD-10-CM | POA: Insufficient documentation

## 2015-10-23 DIAGNOSIS — Q792 Exomphalos: Secondary | ICD-10-CM | POA: Diagnosis not present

## 2015-10-23 DIAGNOSIS — Z833 Family history of diabetes mellitus: Secondary | ICD-10-CM | POA: Insufficient documentation

## 2015-10-23 DIAGNOSIS — Z823 Family history of stroke: Secondary | ICD-10-CM | POA: Insufficient documentation

## 2015-10-23 DIAGNOSIS — I440 Atrioventricular block, first degree: Secondary | ICD-10-CM | POA: Insufficient documentation

## 2015-10-23 DIAGNOSIS — I5020 Unspecified systolic (congestive) heart failure: Secondary | ICD-10-CM | POA: Diagnosis not present

## 2015-10-23 DIAGNOSIS — S42021A Displaced fracture of shaft of right clavicle, initial encounter for closed fracture: Secondary | ICD-10-CM | POA: Diagnosis not present

## 2015-10-23 DIAGNOSIS — R0600 Dyspnea, unspecified: Secondary | ICD-10-CM | POA: Diagnosis present

## 2015-10-23 DIAGNOSIS — E669 Obesity, unspecified: Secondary | ICD-10-CM | POA: Insufficient documentation

## 2015-10-23 DIAGNOSIS — R0602 Shortness of breath: Secondary | ICD-10-CM | POA: Diagnosis not present

## 2015-10-23 DIAGNOSIS — Z7901 Long term (current) use of anticoagulants: Secondary | ICD-10-CM | POA: Insufficient documentation

## 2015-10-23 DIAGNOSIS — I471 Supraventricular tachycardia: Secondary | ICD-10-CM | POA: Diagnosis not present

## 2015-10-23 DIAGNOSIS — M1990 Unspecified osteoarthritis, unspecified site: Secondary | ICD-10-CM | POA: Insufficient documentation

## 2015-10-23 DIAGNOSIS — Z87891 Personal history of nicotine dependence: Secondary | ICD-10-CM | POA: Insufficient documentation

## 2015-10-23 DIAGNOSIS — R55 Syncope and collapse: Secondary | ICD-10-CM | POA: Diagnosis not present

## 2015-10-23 DIAGNOSIS — J45909 Unspecified asthma, uncomplicated: Secondary | ICD-10-CM | POA: Diagnosis not present

## 2015-10-23 LAB — BASIC METABOLIC PANEL
Anion gap: 11 (ref 5–15)
BUN: 16 mg/dL (ref 6–20)
CO2: 27 mmol/L (ref 22–32)
Calcium: 9 mg/dL (ref 8.9–10.3)
Chloride: 102 mmol/L (ref 101–111)
Creatinine, Ser: 0.86 mg/dL (ref 0.61–1.24)
GFR calc Af Amer: >60 mL/min (ref >60)
GFR calc non Af Amer: >60 mL/min (ref >60)
Glucose, Bld: 105 mg/dL — ABNORMAL HIGH (ref 65–99)
Potassium: 3 mmol/L — ABNORMAL LOW (ref 3.5–5.1)
Sodium: 140 mmol/L (ref 135–145)

## 2015-10-23 LAB — CBC
HCT: 42 % (ref 39.0–52.0)
Hemoglobin: 14.8 g/dL (ref 13.0–17.0)
MCH: 31.9 pg (ref 26.0–34.0)
MCHC: 35.2 g/dL (ref 30.0–36.0)
MCV: 90.5 fL (ref 78.0–100.0)
Platelets: 179 10*3/uL (ref 150–400)
RBC: 4.64 MIL/uL (ref 4.22–5.81)
RDW: 13.8 % (ref 11.5–15.5)
WBC: 7.8 10*3/uL (ref 4.0–10.5)

## 2015-10-23 LAB — PROTIME-INR
INR: 2.04
Prothrombin Time: 23.3 seconds — ABNORMAL HIGH (ref 11.4–15.2)

## 2015-10-23 LAB — MRSA PCR SCREENING: MRSA by PCR: NEGATIVE

## 2015-10-23 LAB — I-STAT ARTERIAL BLOOD GAS, ED
Acid-Base Excess: 1 mmol/L (ref 0.0–2.0)
Bicarbonate: 23.9 mmol/L (ref 20.0–28.0)
O2 Saturation: 90 %
Patient temperature: 97.9
TCO2: 25 mmol/L (ref 0–100)
pCO2 arterial: 33.1 mmHg (ref 32.0–48.0)
pH, Arterial: 7.465 — ABNORMAL HIGH (ref 7.350–7.450)
pO2, Arterial: 53 mmHg — ABNORMAL LOW (ref 83.0–108.0)

## 2015-10-23 LAB — I-STAT TROPONIN, ED: Troponin i, poc: 0.03 ng/mL (ref 0.00–0.08)

## 2015-10-23 LAB — MAGNESIUM: Magnesium: 1.8 mg/dL (ref 1.7–2.4)

## 2015-10-23 LAB — APTT: aPTT: 35 seconds (ref 24–36)

## 2015-10-23 LAB — BRAIN NATRIURETIC PEPTIDE: B Natriuretic Peptide: 91.6 pg/mL (ref 0.0–100.0)

## 2015-10-23 MED ORDER — WARFARIN SODIUM 5 MG PO TABS
2.5000 mg | ORAL_TABLET | ORAL | Status: DC
  Administered 2015-10-23: 2.5 mg via ORAL
  Filled 2015-10-23: qty 1

## 2015-10-23 MED ORDER — ACETAMINOPHEN 650 MG RE SUPP
650.0000 mg | Freq: Four times a day (QID) | RECTAL | Status: DC | PRN
Start: 2015-10-23 — End: 2015-10-24

## 2015-10-23 MED ORDER — IPRATROPIUM-ALBUTEROL 0.5-2.5 (3) MG/3ML IN SOLN
3.0000 mL | Freq: Once | RESPIRATORY_TRACT | Status: AC
  Administered 2015-10-23: 3 mL via RESPIRATORY_TRACT
  Filled 2015-10-23: qty 3

## 2015-10-23 MED ORDER — DEXTROMETHORPHAN POLISTIREX ER 30 MG/5ML PO SUER
30.0000 mg | Freq: Two times a day (BID) | ORAL | Status: DC
  Administered 2015-10-23: 30 mg via ORAL
  Filled 2015-10-23 (×2): qty 5

## 2015-10-23 MED ORDER — ONDANSETRON HCL 4 MG/2ML IJ SOLN
INTRAMUSCULAR | Status: AC
  Filled 2015-10-23: qty 2

## 2015-10-23 MED ORDER — LORATADINE 10 MG PO TABS
10.0000 mg | ORAL_TABLET | Freq: Every day | ORAL | Status: DC
  Filled 2015-10-23: qty 1

## 2015-10-23 MED ORDER — CARVEDILOL 6.25 MG PO TABS: 6.2500 mg | ORAL_TABLET | Freq: Two times a day (BID) | ORAL | Status: DC

## 2015-10-23 MED ORDER — WARFARIN SODIUM 5 MG PO TABS: 5.0000 mg | ORAL_TABLET | ORAL | Status: DC

## 2015-10-23 MED ORDER — GUAIFENESIN ER 600 MG PO TB12
600.0000 mg | ORAL_TABLET | Freq: Two times a day (BID) | ORAL | Status: DC
  Administered 2015-10-23: 600 mg via ORAL
  Filled 2015-10-23: qty 1

## 2015-10-23 MED ORDER — GABAPENTIN 400 MG PO CAPS
400.0000 mg | ORAL_CAPSULE | Freq: Two times a day (BID) | ORAL | Status: DC
  Administered 2015-10-23: 400 mg via ORAL
  Filled 2015-10-23: qty 1

## 2015-10-23 MED ORDER — SODIUM CHLORIDE 0.9% FLUSH
3.0000 mL | Freq: Two times a day (BID) | INTRAVENOUS | Status: DC
  Administered 2015-10-23: 3 mL via INTRAVENOUS

## 2015-10-23 MED ORDER — ONDANSETRON HCL 4 MG/2ML IJ SOLN
4.0000 mg | Freq: Once | INTRAMUSCULAR | Status: AC
  Administered 2015-10-23: 4 mg via INTRAVENOUS

## 2015-10-23 MED ORDER — PANTOPRAZOLE SODIUM 40 MG PO TBEC
40.0000 mg | DELAYED_RELEASE_TABLET | Freq: Every day | ORAL | Status: DC
  Administered 2015-10-23: 40 mg via ORAL
  Filled 2015-10-23: qty 1

## 2015-10-23 MED ORDER — SODIUM CHLORIDE 0.9 % IV BOLUS (SEPSIS)
1000.0000 mL | Freq: Once | INTRAVENOUS | Status: AC
  Administered 2015-10-23: 1000 mL via INTRAVENOUS

## 2015-10-23 MED ORDER — ADENOSINE 6 MG/2ML IV SOLN
INTRAVENOUS | Status: AC
  Filled 2015-10-23: qty 4

## 2015-10-23 MED ORDER — ACETAMINOPHEN 325 MG PO TABS
650.0000 mg | ORAL_TABLET | Freq: Four times a day (QID) | ORAL | Status: DC | PRN
Start: 2015-10-23 — End: 2015-10-24
  Administered 2015-10-23: 650 mg via ORAL
  Filled 2015-10-23: qty 2

## 2015-10-23 MED ORDER — AMIODARONE HCL 200 MG PO TABS
200.0000 mg | ORAL_TABLET | Freq: Every day | ORAL | Status: DC
  Administered 2015-10-23: 200 mg via ORAL
  Filled 2015-10-23: qty 1

## 2015-10-23 MED ORDER — ONDANSETRON HCL 4 MG/2ML IJ SOLN: 4.0000 mg | Freq: Once | INTRAMUSCULAR | Status: DC

## 2015-10-23 MED ORDER — GABAPENTIN 800 MG PO TABS
400.0000 mg | ORAL_TABLET | Freq: Two times a day (BID) | ORAL | Status: DC
  Filled 2015-10-23: qty 0.5

## 2015-10-23 MED ORDER — NAPROXEN 250 MG PO TABS
500.0000 mg | ORAL_TABLET | Freq: Once | ORAL | Status: DC
  Filled 2015-10-23: qty 2

## 2015-10-23 MED ORDER — FLUTICASONE PROPIONATE 50 MCG/ACT NA SUSP
2.0000 | Freq: Every day | NASAL | Status: DC
  Administered 2015-10-23: 2 via NASAL
  Filled 2015-10-23: qty 16

## 2015-10-23 MED ORDER — POTASSIUM CHLORIDE CRYS ER 20 MEQ PO TBCR
40.0000 meq | EXTENDED_RELEASE_TABLET | Freq: Once | ORAL | Status: AC
  Administered 2015-10-23: 40 meq via ORAL
  Filled 2015-10-23: qty 2

## 2015-10-23 MED ORDER — ESCITALOPRAM OXALATE 20 MG PO TABS
20.0000 mg | ORAL_TABLET | Freq: Every day | ORAL | Status: DC
  Filled 2015-10-23: qty 1

## 2015-10-23 MED ORDER — TAMSULOSIN HCL 0.4 MG PO CAPS
0.4000 mg | ORAL_CAPSULE | Freq: Two times a day (BID) | ORAL | Status: DC
  Filled 2015-10-23: qty 1

## 2015-10-23 MED ORDER — SODIUM CHLORIDE 0.9 % IV SOLN
Freq: Once | INTRAVENOUS | Status: AC
  Administered 2015-10-23: 15:00:00 via INTRAVENOUS

## 2015-10-23 MED ORDER — SODIUM CHLORIDE 0.9 % IV BOLUS (SEPSIS): 1000.0000 mL | Freq: Once | INTRAVENOUS | Status: DC

## 2015-10-23 MED ORDER — WARFARIN - PHARMACIST DOSING INPATIENT: Freq: Every day | Status: DC

## 2015-10-23 MED ORDER — ALBUTEROL (5 MG/ML) CONTINUOUS INHALATION SOLN
10.0000 mg/h | INHALATION_SOLUTION | Freq: Once | RESPIRATORY_TRACT | Status: AC
  Administered 2015-10-23: 10 mg/h via RESPIRATORY_TRACT
  Filled 2015-10-23: qty 20

## 2015-10-23 MED ORDER — ZOLPIDEM TARTRATE 5 MG PO TABS
5.0000 mg | ORAL_TABLET | Freq: Every evening | ORAL | Status: DC | PRN
  Administered 2015-10-23: 5 mg via ORAL
  Filled 2015-10-23: qty 1

## 2015-10-23 NOTE — H&P (Signed)
Date: 10/23/2015               Patient Name:  Brandon [name]. MRN: [id number]  DOB: 09-Apr-1950 Age / Sex: 65 y.o., male   PCP: Delilah Shan, MD         Medical Service: Internal Medicine Teaching Service         Attending Physician: Dr. Orlie Dakin, MD    First Contact: Dr. Reesa Chew Pager: [phone]  Second Contact: Dr. Juleen China Pager: [phone]       After Hours (After 5p/  First Contact Pager: [phone]  weekends / holidays): Second Contact Pager: [phone]   Chief Complaint: Shortness of breath  History of Present Illness:[name]. 65 y.o man his past medical history significant for hypertension, atrial fibrillation s/p  ablation, systolic heart failure and Obstructive sleep apnea came to the ED with history of worsening cough and shortness of breath last 2 weeks. He states that he developed this persistent worsening cough with some white sputum  2-3 weeks ago. He also developed some nasal congestion and postnasal drip. The last 2-3 days her cough really worsens and he developed worsening shortness of breath, he states that he is unable to lay flat as as he was becoming short of breath. Last night he was sleeping in a recliner. He was seen at PCP office by a PA on Monday, who did a chest x-ray and told him that it is unremarkable. They also told him to go for a CT chest. So instead of getting CT chest he decided to come to ED today. He do endorse getting some subjective fevers and chills, denies any chest pain or palpitations. He denies any recent sick contact. He also denies any seasonal allergies. There is no history of any urinary symptoms. No GI symptoms except mild nausea.  On presentation to the ER he was noted to have bilateral wheezes, and was giving a continuous nebulizer. Towards the end of this treatment patient reports he began to feel lightheaded and dizzy, and had a brief syncopal episode. At that time his heart rate was noted to be elevated in the 170s, and showed SVT on  telemetry. He was diaphoretic and nauseated at that time. Plans are made to give him adenosine, but he spontaneously converted back to normal sinus rhythm on his own. Pressure was noted to be in the [id number] systolic, and was given IV fluid boluses with improvement in BP. Is also given Zofran for nausea. He appears to be somewhat back to his baseline at the time of assessment, except for mild dizziness and some jitteriness.  Meds:  Current Meds  Medication Sig  . amiodarone (PACERONE) 200 MG tablet Take 1 tablet (200 mg total) by mouth daily.  Marland Kitchen amLODipine (NORVASC) 10 MG tablet Take 10 mg by mouth daily.  . carvedilol (COREG) 6.25 MG tablet Take 1 tablet (6.25 mg total) by mouth 2 (two) times daily.  Marland Kitchen escitalopram (LEXAPRO) 20 MG tablet Take 20 mg by mouth daily.  Marland Kitchen gabapentin (NEURONTIN) 800 MG tablet TAKE 1 TABLET BY MOUTH DAILY WITH SUPPER AND 1 TABLET AT BEDTIME AS NEEDED. (Patient taking differently: TAKE 1 TABLET BY MOUTH DAILY WITH SUPPER AND 1 TABLET AT BEDTIME AS NEEDED FOR NERVE PAIN)  . losartan (COZAAR) 100 MG tablet Take 100 mg by mouth daily.  . pantoprazole (PROTONIX) 40 MG tablet Take 1 tablet (40 mg total) by mouth daily.  . tamsulosin (FLOMAX) 0.4 MG CAPS capsule Take 0.4 mg  by mouth 2 (two) times daily.   Marland Kitchen warfarin (COUMADIN) 5 MG tablet TAKE AS DIRECTED BY COUMADIN CLINIC (Patient taking differently: PT TAKES 2.5MG  ON M, W, F - PT TAKES 5MG  ALL OTHER DAYS)     Allergies: Allergies as of 10/23/2015 - Review Complete 10/23/2015  Allergen Reaction Noted  . Ace inhibitors Cough 03/08/2013   Past Medical History:  Diagnosis Date  . Anxiety   . Arthritis   . Benign neoplasm of colon   . Benign prostatic hyperplasia with urinary obstruction   . Bilateral pulmonary embolism ([place]) 3/14   admitted to [facility],  treated with Xarelto  . Cataract   . Chronic pain   . Hyperlipemia   . Hypertension   . Insomnia   . Major depressive disorder, recurrent episode ([place])   . Mitral  regurgitation 04/24/2013   Mild by TEE  . Obesity   . OSA (obstructive sleep apnea)    noncompliant with CPAP.  07/27/13- awaiting a CPAP,  . Osteoporosis   . Persistent atrial fibrillation ([place])   . Restless leg syndrome    takes gabapentin  . Sciatica   . Thrombus of left atrial appendage 03/09/2013  . Ventral hernia, unspecified, without mention of obstruction or gangrene    right abdominal wall    Family History: Mother had asthma and died of congestive heart failure . Father died of because of some blood cancer he was not sure about the exact diagnosis.  Social History: He lives alone .he stopped smoking 40 years ago , takes 2-3 drinks of vodka every night .denies any illicit drug use .  Review of Systems: A complete ROS was negative except as per HPI.   Physical Exam: Blood pressure 98/59, pulse 96, temperature 97.9 F (36.6 C), temperature source Oral, resp. rate 19, height 6\' 4"  (1.93 m), weight 248 lb (112.5 kg), SpO2 93 %.  Vitals:   10/23/15 1615 10/23/15 1630 10/23/15 1645 10/23/15 1700  BP: 100/60 109/62 98/59 106/62  Pulse: 96 98 96 95  Resp: 17 17 19 13   Temp:      TempSrc:      SpO2: (!) 89% (!) 89% 93% 93%  Weight:      Height:       General: Vital signs reviewed.  Patient is well-developed and well-nourished, in no acute distress and cooperative with exam.  Head: Normocephalic and atraumatic. Eyes: EOMI, conjunctivae normal, no scleral icterus.  Neck: Supple, trachea midline, normal ROM, no JVD, masses, thyromegaly, or carotid bruit present.  Cardiovascular: RRR, S1 normal, S2 normal, no murmurs, gallops, or rubs. Pulmonary/Chest: Expiratory wheezing on the bases bilaterally. Abdominal: Soft, non-tender, non-distended, BS +, no masses, organomegaly, or guarding present.  Musculoskeletal: No joint deformities, erythema, or stiffness, ROM full and nontender. Extremities: No lower extremity edema bilaterally,  pulses symmetric and intact bilaterally. No  cyanosis or clubbing. Neurological: A&O x3, Strength is normal and symmetric bilaterally, cranial nerve II-XII are grossly intact, no focal motor deficit, sensory intact to light touch bilaterally.  Skin: Warm, dry and intact. No rashes or erythema. Psychiatric: Normal mood and affect. speech and behavior is normal. Cognition and memory are normal.   Labs. BMP Latest Ref Rng & Units 10/23/2015 08/06/2015 07/31/2015  Glucose 65 - 99 mg/dL 105(H) 89 107(H)  BUN 6 - 20 mg/dL 16 21 6   Creatinine 0.61 - 1.24 mg/dL 0.86 1.02 0.76  Sodium 135 - 145 mmol/L 140 139 140  Potassium 3.5 - 5.1 mmol/L 3.0(L) 3.4(L) 2.8(L)  Chloride 101 - 111 mmol/L 102 102 101  CO2 22 - 32 mmol/L 27 30 31   Calcium 8.9 - 10.3 mg/dL 9.0 8.8 8.5(L)   CBC Latest Ref Rng & Units 10/23/2015 07/23/2015 06/27/2015  WBC 4.0 - 10.5 K/uL 7.8 6.2 6.3  Hemoglobin 13.0 - 17.0 g/dL 14.8 15.1 14.1  Hematocrit 39.0 - 52.0 % 42.0 44.4 42.2  Platelets 150 - 400 K/uL 179 198 157   ABG    Component Value Date/Time   PHART 7.465 (H) 10/23/2015 1544   PCO2ART 33.1 10/23/2015 1544   PO2ART 53.0 (L) 10/23/2015 1544   HCO3 23.9 10/23/2015 1544   TCO2 25 10/23/2015 1544   O2SAT 90.0 10/23/2015 1544   Mag. 1.8 BNP.  91.6  EKG: Initial EKG was showing sinus tachycardia during SVT he develops new changes consistent with inferolateral ischemia which resolved on subsequent EKG.  CXR:  FINDINGS: Grossly unchanged borderline enlarged cardiac silhouette and mediastinal contours with tortuosity and potential ectasia of the thoracic aorta. There is persistent mild elevation / eventration of the bilateral hemidiaphragms. No focal airspace opacities. No pleural effusion or pneumothorax. No evidence of edema. No acute osseous abnormalities. Old right mid shaft clavicular fracture with apparent pseudoarthrosis.  IMPRESSION: No acute cardiopulmonary disease.  Assessment & Plan by Problem:  Mr. Crubaugh. 65 y.o man his past medical history  significant for hypertension, atrial fibrillation s/p  ablation, systolic heart failure and Obstructive sleep apnea came to the ED with history of worsening cough and shortness of breath last 2 weeks.  Bronchitis. His symptoms are more consistent with bronchitis. He did become tachycardic and had a short syncopal episode after getting a continuous nebulization with albuterol.he converted back to sinus rhythm spontaneously. - Mucinex 600 mg twice a day  -Dextromethorphan 30 mg twice daily  -Flonase nasal spray  -Claritin 10 mg daily   History of Atrial fibrillation. He underwent ablation earlier this year and has maintained sinus rhythm since that time. Since his ablation he has been maintained on amiodarone 200 mg daily, Coreg 6.25 mg twice a day, and Coumadin for anticoagulation. -Continue his home medications.  CHF exacerbation. His symptoms of orthopnea and worsening shortness of breath were concerning for CHF exacerbation too. His echo earlier this year noted an EF of 45% with diffuse hypokinesis, with biatrial enlargement. Clinically does not look volume overload, he has no lung crackles or lower extremity edema, there was no JVD raised. His last recorded weight was 254 pounds on August 22 during cardiology office visit. His weight today was 248 pounds. -We will reassess him again tomorrow.  Hypertension. He is on amlodipine 10 mg daily and losartan 100 mg daily at home for his blood pressure control. Because of his softer blood pressure we are holding his antihypertensives. -We will reassess his blood pressure tomorrow.  OSA. He was diagnosed with obstructive sleep apnea or restless sleep study and was recommended at CPAP. He states that he was unable to tolerate so he is not using anything.  Insomnia. He states that he was unable to sleep during his previous hospital admissions. He was given Ambien in the past which works really well for him. -Ambien when necessary for  insomnia.  BPH. Continue home dose of Flomax 0.5 mg twice daily.   Dispo: Admit patient to Observation with expected length of stay less than 2 midnights.  Signed: Lorella Nimrod, MD 10/23/2015, 5:00 PM  Pager: [phone]

## 2015-10-23 NOTE — ED Provider Notes (Signed)
[facility] DEPT Provider Note   CSN: [id number] Arrival date & time: 10/23/15  1058     History   Chief Complaint Chief Complaint  Patient presents with  . Shortness of Breath    HPI Brandon B Heman Que. is a 65 y.o. male.ComPlains of cough productive of clear sputum and shortness of breath onset 3 days ago. Denies fever. Symptoms worse with lying supine and improved with sitting upright. Denies any chest pain. No treatment prior to coming here. No other associated symptoms. Patient had his INR checked yesterday reported to be 3.1. He skipped yesterday's Coumadin dose and is instructed by his physician to get his INR rechecked in a week  HPI  Past Medical History:  Diagnosis Date  . Anxiety   . Arthritis   . Benign neoplasm of colon   . Benign prostatic hyperplasia with urinary obstruction   . Bilateral pulmonary embolism ([place]) 3/14   admitted to [facility],  treated with Xarelto  . Cataract   . Chronic pain   . Hyperlipemia   . Hypertension   . Insomnia   . Major depressive disorder, recurrent episode ([place])   . Mitral regurgitation 04/24/2013   Mild by TEE  . Obesity   . OSA (obstructive sleep apnea)    noncompliant with CPAP.  07/27/13- awaiting a CPAP,  . Osteoporosis   . Persistent atrial fibrillation ([place])   . Restless leg syndrome    takes gabapentin  . Sciatica   . Thrombus of left atrial appendage 03/09/2013  . Ventral hernia, unspecified, without mention of obstruction or gangrene    right abdominal wall    Patient Active Problem List   Diagnosis Date Noted  . A-fib ([place]) 07/30/2015  . BP (high blood pressure) 10/26/2014  . Urinary urgency 06/12/2014  . Rotator cuff tear 03/08/2014  . Restless leg syndrome 02/08/2014  . Restless leg 02/08/2014  . Preop cardiovascular exam 07/20/2013  . Recurrent right inguinal hernia 07/17/2013  . Hernia, inguinal, unilateral, recurrent 07/17/2013  . Right groin pain 06/23/2013  . Atrial fibrillation, persistent ([place])  04/26/2013  . Persistent atrial fibrillation ([place]) 04/26/2013  . Mitral regurgitation 04/25/2013  . MI (mitral incompetence) 04/25/2013  . Encounter for therapeutic drug monitoring 04/04/2013  . Atrial fibrillation, recurrent, persistent 03/14/2013  . Embolism and thrombosis of unspecified site 03/14/2013  . Angiopathy 03/14/2013  . Obstructive sleep apnea 03/09/2013  . Thrombus of left atrial appendage 03/09/2013  . Obstructive apnea 03/09/2013  . Right wrist injury 03/21/2012  . Injury of foot, left 03/21/2012  . Hip pain 03/21/2012  . Back pain 03/21/2012  . Other and unspecified hyperlipidemia 03/21/2012  . Arthralgia of hip 03/21/2012  . HLD (hyperlipidemia) 03/21/2012  . Pulmonary embolism ([place]) 03/19/2012  . PE (pulmonary embolism) 03/19/2012  . Acute delirium 03/17/2012  . Bilateral inguinal hernia 05/29/2011  . Umbilical hernia 30/16/0109  . 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 UPPER RESPIRATORY INFECTION, VIRAL 06/20/2008  . EXTRINSIC ASTHMA, UNSPECIFIED 06/20/2008  . Asthma, exogenous 06/20/2008  . COUGH DUE TO ACE INHIBITORS 09/19/2007  . Essential hypertension 08/15/2007  . ANXIETY 07/18/2007    Past Surgical History:  Procedure Laterality Date  . CARDIOVERSION N/A 03/21/2012   Procedure: CARDIOVERSION;  Surgeon: Birdie Riddle, MD;  Location: [facility] ENDOSCOPY;  Service: Cardiovascular;  Laterality: N/A;  . CARDIOVERSION N/A 04/24/2013   Procedure: CARDIOVERSION;  Surgeon: Dorothy Spark, MD;  Location: [facility] ENDOSCOPY;  Service: Cardiovascular;  Laterality: N/A;  . CARDIOVERSION N/A 06/27/2015   Procedure: CARDIOVERSION;  Surgeon: Larey Dresser, MD;  Location: [place];  Service: Cardiovascular;  Laterality: N/A;  . CATARACT EXTRACTION    . COLONOSCOPY W/ POLYPECTOMY    . ELECTROPHYSIOLOGIC STUDY N/A 07/30/2015   Procedure: Atrial Fibrillation Ablation;  Surgeon: Thompson Grayer, MD;  Location: [place] CV LAB;  Service: Cardiovascular;  Laterality: N/A;  . EYE SURGERY  Right    cataract  . FEMUR FRACTURE SURGERY    . HERNIA REPAIR  07/06/2011  . INGUINAL HERNIA REPAIR Right 07/28/2013   Procedure: RIGHT INGUINAL HERNIA REPAIR;  Surgeon: Imogene Burn. Georgette Dover, MD;  Location: [place];  Service: General;  Laterality: Right;  . INSERTION OF MESH Right 07/28/2013   Procedure: INSERTION OF MESH;  Surgeon: Imogene Burn. Georgette Dover, MD;  Location: [place];  Service: General;  Laterality: Right;  . KNEE ARTHROSCOPY     left  . REPLACEMENT TOTAL KNEE Left   . ROTATOR CUFF REPAIR     right  . ROTATOR CUFF REPAIR Left 03/08/2014   DR SUPPLE  . SHOULDER ARTHROSCOPY WITH ROTATOR CUFF REPAIR AND SUBACROMIAL DECOMPRESSION Left 03/08/2014   Procedure: LEFT SHOULDER ARTHROSCOPY WITH ROTATOR CUFF REPAIR/SUBACROMIAL DECOMPRESSION/DISTAL CLAVICLE RESECTION;  Surgeon: Marin Shutter, MD;  Location: [place];  Service: Orthopedics;  Laterality: Left;  . TEE WITHOUT CARDIOVERSION N/A 03/21/2012   Procedure: TRANSESOPHAGEAL ECHOCARDIOGRAM (TEE);  Surgeon: Birdie Riddle, MD;  Location: [place];  Service: Cardiovascular;  Laterality: N/A;  . TEE WITHOUT CARDIOVERSION N/A 04/24/2013   Procedure: TRANSESOPHAGEAL ECHOCARDIOGRAM (TEE);  Surgeon: Dorothy Spark, MD;  Location: [place];  Service: Cardiovascular;  Laterality: N/A;  . TEE WITHOUT CARDIOVERSION N/A 07/29/2015   Procedure: TRANSESOPHAGEAL ECHOCARDIOGRAM (TEE);  Surgeon: Fay Records, MD;  Location: [facility] ENDOSCOPY;  Service: Cardiovascular;  Laterality: N/A;  . TONSILLECTOMY         Home Medications    Prior to Admission medications   Medication Sig Start Date End Date Taking? Authorizing Provider  amiodarone (PACERONE) 200 MG tablet Take 1 tablet (200 mg total) by mouth daily. 07/03/15  Yes Thompson Grayer, MD  amLODipine (NORVASC) 10 MG tablet Take 10 mg by mouth daily.   Yes Historical Provider, MD  carvedilol (COREG) 6.25 MG tablet Take 1 tablet (6.25 mg total) by mouth 2 (two) times daily. 07/03/15  Yes Thompson Grayer, MD    escitalopram (LEXAPRO) 20 MG tablet Take 20 mg by mouth daily.   Yes Historical Provider, MD  gabapentin (NEURONTIN) 800 MG tablet TAKE 1 TABLET BY MOUTH DAILY WITH SUPPER AND 1 TABLET AT BEDTIME AS NEEDED. Patient taking differently: TAKE 1 TABLET BY MOUTH DAILY WITH SUPPER AND 1 TABLET AT BEDTIME AS NEEDED FOR NERVE PAIN 07/03/15  Yes Britt Bottom, MD  losartan (COZAAR) 100 MG tablet Take 100 mg by mouth daily.   Yes Historical Provider, MD  pantoprazole (PROTONIX) 40 MG tablet Take 1 tablet (40 mg total) by mouth daily. 07/31/15  Yes Amber Sena Slate, NP  tamsulosin (FLOMAX) 0.4 MG CAPS capsule Take 0.4 mg by mouth 2 (two) times daily.    Yes Historical Provider, MD  warfarin (COUMADIN) 5 MG tablet TAKE AS DIRECTED BY COUMADIN CLINIC Patient taking differently: PT TAKES 2.5MG ON M, W, F - PT TAKES 5MG ALL OTHER DAYS 07/03/15  Yes Thompson Grayer, MD  cyclobenzaprine (FLEXERIL) 5 MG tablet Take 1 tablet (5 mg total) by mouth every 8 (eight) hours as needed for muscle spasms. Patient not taking: Reported on 10/23/2015 12/13/14   Richard A  Sater, MD    Family History Family History  Problem Relation Age of Onset  . Cancer Father     bone  . Cancer Paternal Grandmother     ovarian  . CVA Other     Fam Hx of multiple myeloma  . Diabetes Other     Fam Hx of DM    Social History Social History  Substance Use Topics  . Smoking status: Former Research scientist (life sciences)  . Smokeless tobacco: Never Used  . Alcohol use 0.0 oz/week     Comment: 2 drinks per night     Allergies   Ace inhibitors   Review of Systems Review of Systems  Constitutional: Negative.   HENT: Negative.   Respiratory: Positive for cough and shortness of breath.   Cardiovascular: Positive for chest pain.       Syncope  Gastrointestinal: Negative.   Musculoskeletal: Negative.   Skin: Negative.   Allergic/Immunologic: Negative.   Neurological: Negative.   Psychiatric/Behavioral: Negative.   All other systems reviewed and are  negative.    Physical Exam Updated Vital Signs BP 134/84 (BP Location: Left Arm)   Pulse 87   Temp 98.3 F (36.8 C) (Oral)   Resp 22   Ht 6' 4"  (1.93 m)   Wt 248 lb (112.5 kg)   SpO2 96%   BMI 30.19 kg/m   Physical Exam  Constitutional: He appears well-developed and well-nourished. No distress.  HENT:  Head: Normocephalic and atraumatic.  Eyes: Conjunctivae are normal. Pupils are equal, round, and reactive to light.  Neck: Neck supple. No JVD present. No tracheal deviation present. No thyromegaly present.  Cardiovascular: Normal rate, regular rhythm and intact distal pulses.   No murmur heard. Pulmonary/Chest: Effort normal.  Scant rhonchi at bases  Abdominal: Soft. Bowel sounds are normal. He exhibits no distension. There is no tenderness.  Musculoskeletal: Normal range of motion. He exhibits no edema or tenderness.  Neurological: He is alert. Coordination normal.  Skin: Skin is warm and dry. No rash noted.  Psychiatric: He has a normal mood and affect.  Nursing note and vitals reviewed.    ED Treatments / Results  Labs (all labs ordered are listed, but only abnormal results are displayed) Labs Reviewed  [place], ED    EKG  EKG Interpretation  Date/Time:  Wednesday October 23 2015 11:02:34 EDT Ventricular Rate:  106 PR Interval:  198 QRS Duration: 108 QT Interval:  352 QTC Calculation: 467 R Axis:   -52 Text Interpretation:  Sinus tachycardia Left axis deviation Anterior infarct , age undetermined Abnormal ECG SINCE LAST TRACING HEART RATE HAS INCREASED Confirmed by Winfred Leeds  MD, Francely Craw [phone]) on 10/23/2015 11:10:33 AM      Results for orders placed or performed during the hospital encounter of 97/94/80  Basic metabolic panel  Result Value Ref Range   Sodium 140 135 - 145 mmol/L   Potassium 3.0 (L) 3.5 - 5.1 mmol/L   Chloride 102 101 - 111 mmol/L   CO2 27 22 - 32 mmol/L   Glucose, Bld  105 (H) 65 - 99 mg/dL   BUN 16 6 - 20 mg/dL   Creatinine, Ser 0.86 0.61 - 1.24 mg/dL   Calcium 9.0 8.9 - 10.3 mg/dL   GFR calc non Af Amer >60 >60 mL/min   GFR calc Af Amer >60 >60 mL/min   Anion gap 11 5 - 15  CBC  Result Value Ref Range   WBC  7.8 4.0 - 10.5 K/uL   RBC 4.64 4.22 - 5.81 MIL/uL   Hemoglobin 14.8 13.0 - 17.0 g/dL   HCT 42.0 39.0 - 52.0 %   MCV 90.5 78.0 - 100.0 fL   MCH 31.9 26.0 - 34.0 pg   MCHC 35.2 30.0 - 36.0 g/dL   RDW 13.8 11.5 - 15.5 %   Platelets 179 150 - 400 K/uL  Brain natriuretic peptide  Result Value Ref Range   B Natriuretic Peptide 91.6 0.0 - 100.0 pg/mL  Magnesium  Result Value Ref Range   Magnesium 1.8 1.7 - 2.4 mg/dL  I-stat troponin, ED  Result Value Ref Range   Troponin i, poc 0.03 0.00 - 0.08 ng/mL   Comment 3          I-Stat Arterial Blood Gas, ED - (order at [facility] and [facility] only)  Result Value Ref Range   pH, Arterial 7.465 (H) 7.350 - 7.450   pCO2 arterial 33.1 32.0 - 48.0 mmHg   pO2, Arterial 53.0 (L) 83.0 - 108.0 mmHg   Bicarbonate 23.9 20.0 - 28.0 mmol/L   TCO2 25 0 - 100 mmol/L   O2 Saturation 90.0 %   Acid-Base Excess 1.0 0.0 - 2.0 mmol/L   Patient temperature 97.9 F    Collection site RADIAL, ALLEN'S TEST ACCEPTABLE    Sample type ARTERIAL    Dg Chest 2 View  Result Date: 10/23/2015 CLINICAL DATA:  Increased shortness of breath several weeks, worse during the past 2 days. History of CHF and hypertension. EXAM: CHEST  2 VIEW COMPARISON:  11/21/2015; 05/26/2014; 03/16/2012; chest CT - 05/03/2013 FINDINGS: Grossly unchanged borderline enlarged cardiac silhouette and mediastinal contours with tortuosity and potential ectasia of the thoracic aorta. There is persistent mild elevation / eventration of the bilateral hemidiaphragms. No focal airspace opacities. No pleural effusion or pneumothorax. No evidence of edema. No acute osseous abnormalities. Old right mid shaft clavicular fracture with apparent pseudoarthrosis. IMPRESSION: No acute  cardiopulmonary disease. Electronically Signed   By: Sandi Mariscal M.D.   On: 10/23/2015 12:15    Radiology No results found.  Procedures Procedures (including critical care time)  Medications Ordered in ED Medications - No data to display   Initial Impression / Assessment and Plan / ED Course  I have reviewed the triage vital signs and the nursing notes.  Pertinent labs & imaging results that were available during my care of the patient were reviewed by me and considered in my medical decision making (see chart for details).  Clinical Course   1420 p.m. patient's breathing is improved while on continuous nebulization. He appears comfortable.  1450 p.m. patient had syncopal event. He was unconscious for approximately 1 minute. Regained consciousness stating he does not feel well. He is diaphoretic and ill appearing.. ED ECG REPORT   Date: 10/23/2015  Rate: 161  Rhythm: supraventricular tachycardia (SVT)  QRS Axis: left  Intervals: normal  ST/T Wave abnormalities: ST depressions inferiorly and ST depressions laterally  Conduction Disutrbances:left anterior fascicular block  Narrative Interpretation:   Old EKG Reviewed: Inferolateral ischemic changes, new since tracing earlier today, possibly related related Intravenous normal saline bolus ordered 1500 hrs. patient began to wretch and felt nauseated. IV Zofran ordered. 1515 p.m. he feels improved skin now dry. No longer nauseated. ED ECG REPORT   Date: 10/23/2015  Rate:   Rhythm: sinus tachycardia  QRS Axis: left  Intervals: normal  ST/T Wave abnormalities: nonspecific T wave changes  Conduction Disutrbances:left anterior fascicular block  Narrative  Interpretation:   Old EKG Reviewed: changes noted ischemic changes have resolved Ischemic changes have resolved from previous tracing I have personally reviewed the EKG tracing and agree with the computerized printout as noted. I have personally reviewed the EKG tracing and  agree with the computerized printout as noted. 5 PM patient continues to feel improved after treatment with intravenous fluids. Noted to be hypoxic. Patient likely had rate related ischemia secondary to SVT which began after nebulization. He is also noted be hypoxic likely secondary to bronchitis. Strongly doubt pulmonary embolism. With cough, productive and mildly supra therapeutic INR yesterday. Dr. Juleen China from internal medicine service consulted and will evaluate patient in the emergency department. I also consulted cardiology service who saw patient in the ED. Plan 23 hour observation to stepdown unit, supplemental oxygen Final Clinic  Final Impressions(s) / ED Diagnoses  diagnoses #1 acute dyspnea #2 hypoxemia #3 syncope #4 supraventricular tachycardia #5 hypokalemia Final diagnoses:  None  CRITICAL CARE Performed by: Orlie Dakin Total critical care time: 30 minutes Critical care time was exclusive of separately billable procedures and treating other patients. Critical care was necessary to treat or prevent imminent or life-threatening deterioration. Critical care was time spent personally by me on the following activities: development of treatment plan with patient and/or surrogate as well as nursing, discussions with consultants, evaluation of patient's response to treatment, examination of patient, obtaining history from patient or surrogate, ordering and performing treatments and interventions, ordering and review of laboratory studies, ordering and review of radiographic studies, pulse oximetry and re-evaluation of patient's condition.  New Prescriptions New Prescriptions   No medications on file     Orlie Dakin, MD 10/23/15 1708

## 2015-10-23 NOTE — ED Notes (Signed)
Pt HR jumped to 170, ready for cardioversion. Pt HR now back to 103, Hypotensive, saturations 85%, pt placed on 4L [place], bolus hung.

## 2015-10-23 NOTE — Progress Notes (Signed)
ANTICOAGULATION CONSULT NOTE - Initial Consult  Pharmacy Consult for Warfarin Indication: atrial fibrillation  Allergies  Allergen Reactions  . Ace Inhibitors Cough    Patient Measurements: Height: 6\' 4"  (193 cm) Weight: 248 lb (112.5 kg) IBW/kg (Calculated) : 86.8   Vital Signs: Temp: 97.6 F (36.4 C) (10/18 1956) Temp Source: Oral (10/18 1956) BP: 109/81 (10/18 1851) Pulse Rate: 94 (10/18 1851)  Labs:  Recent Labs  10/22/15 0925 10/23/15 1107  HGB  --  14.8  HCT  --  42.0  PLT  --  179  INR 3.1  --   CREATININE  --  0.86    Estimated Creatinine Clearance: 114.5 mL/min (by C-G formula based on SCr of 0.86 mg/dL).   Medical History: Past Medical History:  Diagnosis Date  . Anxiety   . Arthritis   . Benign neoplasm of colon   . Benign prostatic hyperplasia with urinary obstruction   . Bilateral pulmonary embolism ([place]) 3/14   admitted to [facility],  treated with Xarelto  . Cataract   . Chronic pain   . Hyperlipemia   . Hypertension   . Insomnia   . Major depressive disorder, recurrent episode ([place])   . Mitral regurgitation 04/24/2013   Mild by TEE  . Obesity   . OSA (obstructive sleep apnea)    noncompliant with CPAP.  07/27/13- awaiting a CPAP,  . Osteoporosis   . Persistent atrial fibrillation ([place])   . Restless leg syndrome    takes gabapentin  . Sciatica   . Thrombus of left atrial appendage 03/09/2013  . Ventral hernia, unspecified, without mention of obstruction or gangrene    right abdominal wall      Assessment: 65yom admitted with SOB ans SVT after albuterol neb treatment in ED.  Hx Afib s/p ablation 7/17 on warfarin PTA.  INR at [facility] 10/17 3.1.  No bleeding will continue home dose.  Goal of Therapy:  INR 2-3 Monitor platelets by anticoagulation protocol: Yes   Plan:  Warfarin 5mg  TTSS, 2.5mg  MWF - home dose Daily INR  Bonnita Nasuti Pharm.D. CPP, BCPS Clinical Pharmacist [phone] 10/23/2015 8:27 PM

## 2015-10-23 NOTE — ED Notes (Signed)
Dinner tray ordered, heart healthy diet 

## 2015-10-23 NOTE — ED Notes (Signed)
Dr. Lenna Sciara aware of pt BP, will reassess after second bolus

## 2015-10-23 NOTE — ED Triage Notes (Signed)
Patient complains of increased shortness of breath for several weeks that has worsened the past 2 days. States that when he tries to lay flat coughs with thick mucus and unable to breath, no edema noted, no CP

## 2015-10-23 NOTE — ED Notes (Signed)
Pt in X ray

## 2015-10-23 NOTE — ED Notes (Signed)
Food at bedside.

## 2015-10-23 NOTE — Consult Note (Signed)
Cardiology Consult    Patient ID: Brandon [name]. MRN: [id number], DOB/AGE: [birth year]   Admit date: 10/23/2015 Date of Consult: 10/23/2015  Primary Physician: Nilda Simmer, MD Primary Cardiologist: Dr. Rayann Heman Requesting Provider: Dr. Winfred Leeds Reason for Consultation: SVT, Syncope  Patient Profile    65 year old male with past medical history of AF s/p ablation, hypertension, hyperlipidemia, bilateral PE (14), systolic heart failure who presented with 2 week cough and increasing shortness of breath.  Past Medical History   Past Medical History:  Diagnosis Date  . Anxiety   . Arthritis   . Benign neoplasm of colon   . Benign prostatic hyperplasia with urinary obstruction   . Bilateral pulmonary embolism ([place]) 3/14   admitted to [facility],  treated with Xarelto  . Cataract   . Chronic pain   . Hyperlipemia   . Hypertension   . Insomnia   . Major depressive disorder, recurrent episode ([place])   . Mitral regurgitation 04/24/2013   Mild by TEE  . Obesity   . OSA (obstructive sleep apnea)    noncompliant with CPAP.  07/27/13- awaiting a CPAP,  . Osteoporosis   . Persistent atrial fibrillation ([place])   . Restless leg syndrome    takes gabapentin  . Sciatica   . Thrombus of left atrial appendage 03/09/2013  . Ventral hernia, unspecified, without mention of obstruction or gangrene    right abdominal wall    Past Surgical History:  Procedure Laterality Date  . CARDIOVERSION N/A 03/21/2012   Procedure: CARDIOVERSION;  Surgeon: Birdie Riddle, MD;  Location: [facility] ENDOSCOPY;  Service: Cardiovascular;  Laterality: N/A;  . CARDIOVERSION N/A 04/24/2013   Procedure: CARDIOVERSION;  Surgeon: Dorothy Spark, MD;  Location: [facility] ENDOSCOPY;  Service: Cardiovascular;  Laterality: N/A;  . CARDIOVERSION N/A 06/27/2015   Procedure: CARDIOVERSION;  Surgeon: Larey Dresser, MD;  Location: [place];  Service: Cardiovascular;  Laterality: N/A;  . CATARACT EXTRACTION    . COLONOSCOPY W/  POLYPECTOMY    . ELECTROPHYSIOLOGIC STUDY N/A 07/30/2015   Procedure: Atrial Fibrillation Ablation;  Surgeon: Thompson Grayer, MD;  Location: [place] CV LAB;  Service: Cardiovascular;  Laterality: N/A;  . EYE SURGERY Right    cataract  . FEMUR FRACTURE SURGERY    . HERNIA REPAIR  07/06/2011  . INGUINAL HERNIA REPAIR Right 07/28/2013   Procedure: RIGHT INGUINAL HERNIA REPAIR;  Surgeon: Imogene Burn. Georgette Dover, MD;  Location: [place];  Service: General;  Laterality: Right;  . INSERTION OF MESH Right 07/28/2013   Procedure: INSERTION OF MESH;  Surgeon: Imogene Burn. Georgette Dover, MD;  Location: [place];  Service: General;  Laterality: Right;  . KNEE ARTHROSCOPY     left  . REPLACEMENT TOTAL KNEE Left   . ROTATOR CUFF REPAIR     right  . ROTATOR CUFF REPAIR Left 03/08/2014   DR SUPPLE  . SHOULDER ARTHROSCOPY WITH ROTATOR CUFF REPAIR AND SUBACROMIAL DECOMPRESSION Left 03/08/2014   Procedure: LEFT SHOULDER ARTHROSCOPY WITH ROTATOR CUFF REPAIR/SUBACROMIAL DECOMPRESSION/DISTAL CLAVICLE RESECTION;  Surgeon: Marin Shutter, MD;  Location: [place];  Service: Orthopedics;  Laterality: Left;  . TEE WITHOUT CARDIOVERSION N/A 03/21/2012   Procedure: TRANSESOPHAGEAL ECHOCARDIOGRAM (TEE);  Surgeon: Birdie Riddle, MD;  Location: [place];  Service: Cardiovascular;  Laterality: N/A;  . TEE WITHOUT CARDIOVERSION N/A 04/24/2013   Procedure: TRANSESOPHAGEAL ECHOCARDIOGRAM (TEE);  Surgeon: Dorothy Spark, MD;  Location: [place];  Service: Cardiovascular;  Laterality: N/A;  . TEE WITHOUT CARDIOVERSION N/A 07/29/2015   Procedure: TRANSESOPHAGEAL ECHOCARDIOGRAM (  TEE);  Surgeon: Fay Records, MD;  [facility] ENDOSCOPY;  Service: Cardiovascular;  Laterality: N/A;  . TONSILLECTOMY       Allergies  Allergies  Allergen Reactions  . Ace Inhibitors Cough    History of Present Illness    Brandon [name] is a 65 year old male primarily followed by Dr. Rayann Heman for his PAF. He underwent ablation earlier this year and has maintained sinus  rhythm since that time. He denies any coronary artery disease, and denies any significant family history of coronary artery disease. Since his ablation he has been maintained on amiodarone 200 mg daily, Coreg 6.25 mg twice a day, and Coumadin for anticoagulation. He states he has been doing well since his ablation, and was scheduled to see Dr. Rayann Heman Week in clinic for his follow-up. His echo earlier this year noted an EF of 45% with diffuse hypokinesis, with biatrial enlargement.  Did have an myoview in 2015 which was low risk.   Reports he has been in his usual state of health up until about 2 weeks ago when he developed a persistent cough. Reports this is been productive, and he has been seen by his PCP and placed on medications with no improvement. States that once he begins coughing, becomes increasingly short of breath. States he has coughed up some clear sputum, but is also reported nasal congestion. Reports his symptoms seem to be worse with lying flat, and he has been elevating his head at night whenever he attempts to sleep. He denies any significant lower extremity edema. Does report whenever he has 6 coughing spells he feels like his heart is racing, and attempts to slow his heart rate with deep breathing.  Reports his breathing became worse today, and presented to the ER. His labs showed potassium of 3, other stable electrolytes, BNP 91, POC troponin 0.03, magnesium 1.8. Chest x-ray showed no acute disease, or edema. On presentation to the ER he was noted to have bilateral wheezes, and was giving a continuous nebulizer. Towards the end of this treatment patient reports he began to feel lightheaded and dizzy, and had a brief syncopal episode. At that time his heart rate was noted to be elevated in the 170s, and showed SVT on telemetry. He was diaphoretic and nauseated at that time. Plans are made to give him adenosine, but he spontaneously converted back to normal sinus rhythm on his own. Pressure  was noted to be in the 98J systolic, and was given IV fluid boluses with improvement in BP. Is also given Zofran for nausea. He appears to be somewhat back to his baseline at the time of assessment.   Inpatient Medications    . ondansetron      . ondansetron (ZOFRAN) IV  4 mg Intravenous Once    Family History    Family History  Problem Relation Age of Onset  . Cancer Father     bone  . Cancer Paternal Grandmother     ovarian  . CVA Other     Fam Hx of multiple myeloma  . Diabetes Other     Fam Hx of DM    Social History    Social History   Social History  . Marital status: Married    Spouse name: N/A  . Number of children: N/A  . Years of education: N/A   Occupational History  . Not on file.   Social History Main Topics  . Smoking status: Former Research scientist (life sciences)  . Smokeless tobacco: Never Used  . Alcohol  use 0.0 oz/week     Comment: 2 drinks per night  . Drug use: No     Comment: negative hx for IV drug abuse  . Sexual activity: Not on file   Other Topics Concern  . Not on file   Social History Narrative   Lives with wife in [place]   Retired Dietitian     Review of Systems    General:  No chills, fever, night sweats or weight changes.  Cardiovascular:  No chest pain, dyspnea on exertion, edema, orthopnea, palpitations, paroxysmal nocturnal dyspnea. Dermatological: No rash, lesions/masses Respiratory: ++ cough, ++ dyspnea Urologic: No hematuria, dysuria Abdominal:   No nausea, vomiting, diarrhea, bright red blood per rectum, melena, or hematemesis Neurologic:  No visual changes, wkns, changes in mental status. All other systems reviewed and are otherwise negative except as noted above.  Physical Exam    Blood pressure 109/62, pulse 98, temperature 97.9 F (36.6 C), temperature source Oral, resp. rate 17, height '6\' 4"'$  (1.93 m), weight 248 lb (112.5 kg), SpO2 (!) 89 %.  General: Pleasant older WM, NAD Psych: Normal affect. Neuro: Alert  and oriented X 3. Moves all extremities spontaneously. HEENT: Normal  Neck: Supple without bruits or JVD. Lungs:  Resp regular and unlabored, expiratory wheezing bilaterally. Heart: RRR no s3, s4, or murmurs. Abdomen: Soft, non-tender, non-distended, BS + x 4.  Extremities: No clubbing, cyanosis or edema. DP/PT/Radials 2+ and equal bilaterally.  Labs    Troponin [facility] of Care Test)  Recent Labs  10/23/15 1136  TROPIPOC 0.03   No results for input(s): CKTOTAL, CKMB, TROPONINI in the last 72 hours. Lab Results  Component Value Date   WBC 7.8 10/23/2015   HGB 14.8 10/23/2015   HCT 42.0 10/23/2015   MCV 90.5 10/23/2015   PLT 179 10/23/2015    Recent Labs Lab 10/23/15 1107  NA 140  K 3.0*  CL 102  CO2 27  BUN 16  CREATININE 0.86  CALCIUM 9.0  GLUCOSE 105*   Lab Results  Component Value Date   CHOL 170 03/17/2012   HDL 30 (L) 03/17/2012   LDLCALC 117 (H) 03/17/2012   TRIG 117 03/17/2012   Lab Results  Component Value Date   DDIMER 1.66 (H) 03/17/2012     Radiology Studies    Dg Chest 2 View  Result Date: 10/23/2015 CLINICAL DATA:  Increased shortness of breath several weeks, worse during the past 2 days. History of CHF and hypertension. EXAM: CHEST  2 VIEW COMPARISON:  11/21/2015; 05/26/2014; 03/16/2012; chest CT - 05/03/2013 FINDINGS: Grossly unchanged borderline enlarged cardiac silhouette and mediastinal contours with tortuosity and potential ectasia of the thoracic aorta. There is persistent mild elevation / eventration of the bilateral hemidiaphragms. No focal airspace opacities. No pleural effusion or pneumothorax. No evidence of edema. No acute osseous abnormalities. Old right mid shaft clavicular fracture with apparent pseudoarthrosis. IMPRESSION: No acute cardiopulmonary disease. Electronically Signed   By: Sandi Mariscal M.D.   On: 10/23/2015 12:15    ECG & Cardiac Imaging    EKG: SR with 1st degree AVB, PAC  Echo: 6/17  Study Conclusions  - Left  ventricle: The cavity size was normal. Wall thickness was   increased in a pattern of mild LVH. The estimated ejection   fraction was 45%. Diffuse hypokinesis. Indeterminant diastolic   function. - Aortic valve: There was no stenosis. There was trivial   regurgitation. - Aorta: Dilated aortic root. Aortic root dimension: 43  mm (ED). - Mitral valve: There was mild regurgitation. - Left atrium: The atrium was moderately dilated. - Right ventricle: The cavity size was normal. Systolic function   was normal. - Right atrium: The atrium was moderately dilated. - Tricuspid valve: Peak RV-RA gradient (S): 20 mm Hg. - Pulmonary arteries: PA peak pressure: 23 mm Hg (S). - Inferior vena cava: The vessel was normal in size. The   respirophasic diameter changes were in the normal range (= 50%),   consistent with normal central venous pressure.  Impressions:  - The patient was in atrial fibrillation. Normal LV size with mild   LV hypertrophy. EF 45%, diffuse hypokinesis. Normal RV size and   systolic function. Biatrial enlargement.  Assessment & Plan    65 year old male with past medical history of AF s/p ablation, hypertension, hyperlipidemia, bilateral PE (14), systolic heart failure who presented with 2 week cough and increasing shortness of breath.  1. Tachycardia/SVT: Rhythm noted after he had received a continuous neb treatment for expiratory wheezing. Lasted briefly and converted back to SR without the use of adenosine. Was also noted to be hypotensive after conversion, but improved after receiving IV fluids. Potassium was 3.0 but corrected in the ED, Mag 1.8. -- Would monitor for further arrhythmias with telemetry, suggest the use of Xopenex if further treats are required.  -- Did have noted reduced EF earlier this year, so may benefit from outpatient stress test once recovered from current illness.  2. Cough/ Bronchitis: CXR neg, management per primary team  3. AF s/p ablation:  Currently in SR with 1st degree AVB -- Continue amiodarone, coreg and coumadin  4. HTN: Stable with current therapy.  5. Systolic HF: Does not appear to be volume overloaded on physical exam.   Signed, Reino Bellis, NP-C Pager [phone] 10/23/2015, 4:42 PM

## 2015-10-24 ENCOUNTER — Encounter (HOSPITAL_COMMUNITY): Payer: Self-pay | Admitting: *Deleted

## 2015-10-24 DIAGNOSIS — E876 Hypokalemia: Secondary | ICD-10-CM

## 2015-10-24 DIAGNOSIS — I10 Essential (primary) hypertension: Secondary | ICD-10-CM | POA: Diagnosis not present

## 2015-10-24 DIAGNOSIS — J209 Acute bronchitis, unspecified: Secondary | ICD-10-CM | POA: Diagnosis not present

## 2015-10-24 DIAGNOSIS — J40 Bronchitis, not specified as acute or chronic: Secondary | ICD-10-CM | POA: Diagnosis not present

## 2015-10-24 LAB — CBC
HCT: 35.7 % — ABNORMAL LOW (ref 39.0–52.0)
Hemoglobin: 12.2 g/dL — ABNORMAL LOW (ref 13.0–17.0)
MCH: 31.3 pg (ref 26.0–34.0)
MCHC: 34.2 g/dL (ref 30.0–36.0)
MCV: 91.5 fL (ref 78.0–100.0)
Platelets: 163 10*3/uL (ref 150–400)
RBC: 3.9 MIL/uL — ABNORMAL LOW (ref 4.22–5.81)
RDW: 14.4 % (ref 11.5–15.5)
WBC: 7.6 10*3/uL (ref 4.0–10.5)

## 2015-10-24 LAB — BASIC METABOLIC PANEL
Anion gap: 7 (ref 5–15)
BUN: 13 mg/dL (ref 6–20)
CO2: 30 mmol/L (ref 22–32)
Calcium: 8.6 mg/dL — ABNORMAL LOW (ref 8.9–10.3)
Chloride: 105 mmol/L (ref 101–111)
Creatinine, Ser: 0.79 mg/dL (ref 0.61–1.24)
GFR calc Af Amer: >60 mL/min (ref >60)
GFR calc non Af Amer: >60 mL/min (ref >60)
Glucose, Bld: 94 mg/dL (ref 65–99)
Potassium: 2.7 mmol/L — CL (ref 3.5–5.1)
Sodium: 142 mmol/L (ref 135–145)

## 2015-10-24 LAB — PROTIME-INR
INR: 1.96
Prothrombin Time: 22.6 seconds — ABNORMAL HIGH (ref 11.4–15.2)

## 2015-10-24 MED ORDER — AZITHROMYCIN 250 MG PO TABS: ORAL_TABLET | ORAL | 0 refills | Status: DC

## 2015-10-24 MED ORDER — POTASSIUM CHLORIDE CRYS ER 20 MEQ PO TBCR: 40.0000 meq | EXTENDED_RELEASE_TABLET | Freq: Once | ORAL | Status: DC

## 2015-10-24 MED ORDER — POTASSIUM CHLORIDE CRYS ER 20 MEQ PO TBCR: 40.0000 meq | EXTENDED_RELEASE_TABLET | Freq: Two times a day (BID) | ORAL | Status: DC

## 2015-10-24 MED ORDER — LORATADINE 10 MG PO TABS: 10.0000 mg | ORAL_TABLET | Freq: Every day | ORAL | 0 refills | Status: DC

## 2015-10-24 MED ORDER — DM-GUAIFENESIN ER 30-600 MG PO TB12: 2.0000 | ORAL_TABLET | Freq: Two times a day (BID) | ORAL | 0 refills | Status: DC

## 2015-10-24 MED ORDER — FLUTICASONE PROPIONATE 50 MCG/ACT NA SUSP: 2.0000 | Freq: Every day | NASAL | 2 refills | Status: DC

## 2015-10-24 MED ORDER — POTASSIUM CHLORIDE 10 MEQ/100ML IV SOLN
10.0000 meq | INTRAVENOUS | Status: DC
  Administered 2015-10-24: 10 meq via INTRAVENOUS
  Filled 2015-10-24 (×4): qty 100

## 2015-10-24 MED ORDER — MAGNESIUM SULFATE 2 GM/50ML IV SOLN
2.0000 g | Freq: Once | INTRAVENOUS | Status: DC
  Filled 2015-10-24: qty 50

## 2015-10-24 MED ORDER — POTASSIUM CHLORIDE CRYS ER 20 MEQ PO TBCR
40.0000 meq | EXTENDED_RELEASE_TABLET | Freq: Once | ORAL | Status: AC
Start: 2015-10-24 — End: 2015-10-24
  Administered 2015-10-24: 40 meq via ORAL
  Filled 2015-10-24: qty 2

## 2015-10-24 NOTE — Progress Notes (Signed)
Patient not in room to give Flutter device. Patient went home already.

## 2015-10-24 NOTE — Progress Notes (Signed)
ANTICOAGULATION CONSULT NOTE - Initial Consult  Pharmacy Consult for Warfarin Indication: atrial fibrillation   Assessment: 65yom admitted with SOB ans SVT after albuterol neb treatment in ED.  Hx Afib s/p ablation 7/17 on warfarin PTA.  INR at [facility] 10/17 3.1 but 2.0 last night and down to 1.9 this am.  No bleeding noted overnight will continue home dose as he is scheduled to receive higher dose tonight.  He continues on his home amiodarone which was started in June.  Goal of Therapy:  INR 2-3 Monitor platelets by anticoagulation protocol: Yes   Plan:  Warfarin 5mg  TTSS, 2.5mg  MWF - home dose Daily INR   Allergies  Allergen Reactions  . Ace Inhibitors Cough    Patient Measurements: Height: 6\' 4"  (193 cm) Weight: 248 lb (112.5 kg) IBW/kg (Calculated) : 86.8   Vital Signs: Temp: 98.5 F (36.9 C) (10/19 0700) Temp Source: Oral (10/19 0700) BP: 122/62 (10/19 0700) Pulse Rate: 70 (10/19 0700)  Labs:  Recent Labs  10/22/15 0925 10/23/15 1107 10/23/15 2111 10/24/15 0410  HGB  --  14.8  --  12.2*  HCT  --  42.0  --  35.7*  PLT  --  179  --  163  APTT  --   --  35  --   LABPROT  --   --  23.3* 22.6*  INR 3.1  --  2.04 1.96  CREATININE  --  0.86  --  0.79    Estimated Creatinine Clearance: 123.1 mL/min (by C-G formula based on SCr of 0.79 mg/dL).   Medical History: Past Medical History:  Diagnosis Date  . Anxiety   . Arthritis   . Benign neoplasm of colon   . Benign prostatic hyperplasia with urinary obstruction   . Bilateral pulmonary embolism ([place]) 3/14   admitted to [facility],  treated with Xarelto  . Cataract   . Chronic pain   . Hyperlipemia   . Hypertension   . Insomnia   . Major depressive disorder, recurrent episode ([place])   . Mitral regurgitation 04/24/2013   Mild by TEE  . Obesity   . OSA (obstructive sleep apnea)    noncompliant with CPAP.  07/27/13- awaiting a CPAP,  . Osteoporosis   . Persistent atrial fibrillation ([place])   . Restless leg  syndrome    takes gabapentin  . Sciatica   . Thrombus of left atrial appendage 03/09/2013  . Ventral hernia, unspecified, without mention of obstruction or gangrene    right abdominal wall   Erin Hearing PharmD., BCPS Clinical Pharmacist Pager [phone] 10/24/2015 8:48 AM

## 2015-10-24 NOTE — Progress Notes (Signed)
Upon initial assessment this AM patient stated he needed to be discharged ASAP due to having a small dog alone at home for almost 24 hours. MD notified who stated they would come see patient during first rounds. Approx 30 mins later MD arrived to unit and placed discharge orders. He was scheduled to receive 4 runs of IV potassium and 1 run of IV magnesium this AM, but only received most of 1 run of IV potassium. He declined to take his other AM meds, stating he would just take them when he got home.   Patient to be discharged to home. PIV x2 removed. CCMD and Elink notified of patient's discharge. Discharge instructions, prescriptions, and follow up appts reviewed. Patient to provide own transportation home. All belongings returned prior to discharge.  Joellen Jersey, RN.

## 2015-10-24 NOTE — Care Management Note (Signed)
Case Management Note  Patient Details  Name: Brandon [name]. MRN: [id number] Date of Birth: [birth year]  Subjective/Objective:    Bronchitis, shortness of breath               Action/Plan: Discharge Planning: AVS reviewed: NCM spoke to pt and no needs identified. Pt reports he is independent at home.   Expected Discharge Date:  10/24/15               Expected Discharge Plan:  Home/Self Care  In-House Referral:  NA  Discharge planning Services  CM Consult  Post Acute Care Choice:  NA Choice offered to:  NA  DME Arranged:  N/A DME Agency:  NA  HH Arranged:  NA HH Agency:  NA  Status of Service:  Completed, signed off  If discussed at [place] of Stay Meetings, dates discussed:    Additional Comments:  Erenest Rasher, RN 10/24/2015, 10:04 AM

## 2015-10-24 NOTE — Progress Notes (Signed)
  Date: 10/24/2015  Patient name: Brandon [name].  Medical record number: [id number]  Date of birth: 12/13/1950   I have seen and evaluated Wylie B Imhof [name] and discussed their care with the Residency Team. Briefly, Mr. Ristic is a 65yo man with PMH of HTN, Afib s/p ablation, systolic CHF, OSA who came to the ED with worsening cough, SOB X 2 weeks.  Further symptoms include white sputum, congestion and postnasal drip along with subjective fever and chills.  Over the last few days his cough has worsened to the point that he was unable to lie flat when sleeping.  He went to his PCP on Monday, CXR was noted to be without issue per him and he was advised to get a CT of the chest, and he came to the ED.  He has had no sick contacts.  In the ED, he was noted to be wheezing and received a duoneb with no real improvement.  He then had a continuous neb.  When finishing that medication, he became lightheaded, dizzy and had a brief syncopal episode.  He was in SVT at the time, and there were plans to give him adenosine, but he spontaneously converted.  His BP Was low and this improved with fluids.    This morning, he was noted to have a low K, which is likely related to the albuterol use.  He received PO K and also was receiving IV K, however, he was very insistent to go home b/c he had a pet dog to feed.  He was tearful in asking to go home, stating he "does not care" about his potassium and he will just come back if he has any issue.  He noted that is we did not discharge him, he would be leaving regardless in "half an hour."    Vitals:   10/24/15 0345 10/24/15 0700  BP:  122/62  Pulse:  70  Resp:  (!) 23  Temp: 97.5 F (36.4 C) 98.5 F (36.9 C)   Gen: Anxious, tearful CV: RR, NR, no murmur Pulm: Expiratory wheezing, cough with deep breath, no rales Abd: Soft, ND Ext: Thin, no edema  Assessment and Plan: I have seen and evaluated the patient as outlined above. I agree with the formulated Assessment and  Plan as detailed in the residents' note, with the following changes:   1. Bronchitis - I Have a very low suspicion for CHF exacerbation given his lack of JVD, worsening SOB with cough, low BNP, CXR without edema.   - Would treat for bronchitis with a short course of azithromycin, cough medicine and mucinex.  Follow up with PCP if not any better by next week.    2. Hypokalemia - He did receive 2meq of KCL and 1 37meq run of KCL IV - Advised him to return to his PCP to have his K checked tomorrow.  He reported understanding.  3. Afib: Reverted to NSR, continue home medications.   At his request we will discharge him, however, it was advised that he stay to make sure his K is improved.   Sid Falcon, MD 10/19/20178:33 PM

## 2015-10-24 NOTE — Discharge Instructions (Signed)

## 2015-10-24 NOTE — Progress Notes (Signed)
   Subjective: Patient was worried this morning that his dog is alone at home since yesterday. He don't have anyone to go and feed him. He daily told us that he will go home with the next half an hour if we will not discharge him. He states that he is feeling better except that mild cough.  Objective:  Vital signs in last 24 hours: Vitals:   10/23/15 2342 10/24/15 0339 10/24/15 0345 10/24/15 0700  BP:  123/85  122/62  Pulse:  73  70  Resp:  19  (!) 23  Temp: 97.8 F (36.6 C)  97.5 F (36.4 C) 98.5 F (36.9 C)  TempSrc: Oral  Oral Oral  SpO2:  90%  98%  Weight:      Height:       Gen. well-built, well-nourished man, looking very anxious and disturbed. Lungs. Clear bilaterally, with some transmitted upper respiratory sounds. CV. Regular rate and rhythm Abdomen. Soft, nontender, bowel sounds positive. Extremities. No edema, no cyanosis, pulses 2+ bilaterally.  Labs. CBC Latest Ref Rng & Units 10/24/2015 10/23/2015 07/23/2015  WBC 4.0 - 10.5 K/uL 7.6 7.8 6.2  Hemoglobin 13.0 - 17.0 g/dL 12.2(L) 14.8 15.1  Hematocrit 39.0 - 52.0 % 35.7(L) 42.0 44.4  Platelets 150 - 400 K/uL 163 179 198   BMP Latest Ref Rng & Units 10/24/2015 10/23/2015 08/06/2015  Glucose 65 - 99 mg/dL 94 105(H) 89  BUN 6 - 20 mg/dL 13 16 21   Creatinine 0.61 - 1.24 mg/dL 0.79 0.86 1.02  Sodium 135 - 145 mmol/L 142 140 139  Potassium 3.5 - 5.1 mmol/L 2.7(LL) 3.0(L) 3.4(L)  Chloride 101 - 111 mmol/L 105 102 102  CO2 22 - 32 mmol/L 30 27 30   Calcium 8.9 - 10.3 mg/dL 8.6(L) 9.0 8.8   INR.1.96  Assessment/Plan:  Brandon [name]. 65 y.o man his past medical history significant for hypertension, atrial fibrillation s/p  ablation, systolic heart failure and Obstructive sleep apnea came to the ED with history of worsening cough and shortness of breath last 2 weeks. Bronchitis. He was still having some cough and mucus production. He was really concerned about his dog was home alone for the last 24 hours. And he told us that  if he will not discharge him he will leave an AMA. We discharged him on Z pack, Mucinex DM, Flonase and Claritin for his upper respiratory symptoms.  History of Atrial fibrillation. He underwent ablation earlier this year and has maintained sinus rhythm since that time. Continue his home dose of Coumadin, amiodarone and Coreg.  Hypertension. He is on amlodipine 10 mg daily and losartan 100 mg daily at home for his blood pressure control. We hold his antihypertensives during his stay in the hospital because of softer blood pressure. He can resume his medications on discharge.  Dispo: Discharged today.  Lorella Nimrod, MD 10/24/2015, 4:24 PM Pager: [phone]

## 2015-10-25 ENCOUNTER — Emergency Department (HOSPITAL_COMMUNITY)
Admission: EM | Admit: 2015-10-25 | Discharge: 2015-10-25 | Disposition: A | Discharge status: Home / Self Care | Payer: Medicare Other | Attending: Emergency Medicine | Admitting: Emergency Medicine

## 2015-10-25 ENCOUNTER — Encounter (HOSPITAL_COMMUNITY): Payer: Self-pay

## 2015-10-25 DIAGNOSIS — Z79899 Other long term (current) drug therapy: Secondary | ICD-10-CM | POA: Insufficient documentation

## 2015-10-25 DIAGNOSIS — I10 Essential (primary) hypertension: Secondary | ICD-10-CM | POA: Diagnosis not present

## 2015-10-25 DIAGNOSIS — J45909 Unspecified asthma, uncomplicated: Secondary | ICD-10-CM | POA: Diagnosis not present

## 2015-10-25 DIAGNOSIS — Z96652 Presence of left artificial knee joint: Secondary | ICD-10-CM | POA: Insufficient documentation

## 2015-10-25 DIAGNOSIS — E876 Hypokalemia: Secondary | ICD-10-CM | POA: Diagnosis not present

## 2015-10-25 DIAGNOSIS — Z87891 Personal history of nicotine dependence: Secondary | ICD-10-CM | POA: Diagnosis not present

## 2015-10-25 DIAGNOSIS — R5383 Other fatigue: Secondary | ICD-10-CM | POA: Diagnosis present

## 2015-10-25 LAB — BASIC METABOLIC PANEL
Anion gap: 10 (ref 5–15)
BUN: 11 mg/dL (ref 6–20)
CO2: 26 mmol/L (ref 22–32)
Calcium: 9.4 mg/dL (ref 8.9–10.3)
Chloride: 103 mmol/L (ref 101–111)
Creatinine, Ser: 0.97 mg/dL (ref 0.61–1.24)
GFR calc Af Amer: >60 mL/min (ref >60)
GFR calc non Af Amer: >60 mL/min (ref >60)
Glucose, Bld: 117 mg/dL — ABNORMAL HIGH (ref 65–99)
Potassium: 3.1 mmol/L — ABNORMAL LOW (ref 3.5–5.1)
Sodium: 139 mmol/L (ref 135–145)

## 2015-10-25 MED ORDER — POTASSIUM CHLORIDE 10 MEQ/100ML IV SOLN
10.0000 meq | Freq: Once | INTRAVENOUS | Status: AC
  Administered 2015-10-25: 10 meq via INTRAVENOUS
  Filled 2015-10-25: qty 100

## 2015-10-25 MED ORDER — POTASSIUM CHLORIDE CRYS ER 20 MEQ PO TBCR
40.0000 meq | EXTENDED_RELEASE_TABLET | Freq: Once | ORAL | Status: AC
  Administered 2015-10-25: 40 meq via ORAL
  Filled 2015-10-25: qty 2

## 2015-10-25 MED ORDER — MAGNESIUM SULFATE 2 GM/50ML IV SOLN
2.0000 g | INTRAVENOUS | Status: AC
  Administered 2015-10-25: 2 g via INTRAVENOUS
  Filled 2015-10-25: qty 50

## 2015-10-25 NOTE — ED Triage Notes (Signed)
Per pT, Pt is coming from home with need to have potassium checked after discharge from the hospital yesterday morning. Pt reports sudden onset of dizziness and labored breathing that started  This morning. Pt has Hx of low potassium when he left the hospital yesterday, but needed to leave to feed his dog. Pt reports promising to come back.

## 2015-10-25 NOTE — Discharge Instructions (Signed)
Normal diet, Recheck of potassium in 1 week at your family doctors office Today the level was 3.1, this is looking better.

## 2015-10-25 NOTE — Discharge Summary (Signed)
Name: Brandon [name]. MRN: [id number] DOB: June 24, 1950 65 y.o. PCP: Delilah Shan, MD  Date of Admission: 10/23/2015 11:09 AM Date of Discharge: 10/25/2015 Attending Physician: No att. providers found  Discharge Diagnosis: 1. Bronchitis 2. Hypertension 3.Hypokalemia  Discharge Medications:   Medication List    TAKE these medications   amiodarone 200 MG tablet Commonly known as:  PACERONE Take 1 tablet (200 mg total) by mouth daily.   amLODipine 10 MG tablet Commonly known as:  NORVASC Take 10 mg by mouth daily.   azithromycin 250 MG tablet Commonly known as:  ZITHROMAX Z-PAK Take 2 tablets on day one, then one daily for another four days.   carvedilol 6.25 MG tablet Commonly known as:  COREG Take 1 tablet (6.25 mg total) by mouth 2 (two) times daily.   dextromethorphan-guaiFENesin 30-600 MG 12hr tablet Commonly known as:  MUCINEX DM Take 2 tablets by mouth 2 (two) times daily.   escitalopram 20 MG tablet Commonly known as:  LEXAPRO Take 20 mg by mouth daily.   fluticasone 50 MCG/ACT nasal spray Commonly known as:  FLONASE Place 2 sprays into both nostrils daily.   gabapentin 800 MG tablet Commonly known as:  NEURONTIN TAKE 1 TABLET BY MOUTH DAILY WITH SUPPER AND 1 TABLET AT BEDTIME AS NEEDED. What changed:  See the new instructions.   loratadine 10 MG tablet Commonly known as:  CLARITIN Take 1 tablet (10 mg total) by mouth daily.   losartan 100 MG tablet Commonly known as:  COZAAR Take 100 mg by mouth daily.   pantoprazole 40 MG tablet Commonly known as:  PROTONIX Take 1 tablet (40 mg total) by mouth daily.   tamsulosin 0.4 MG Caps capsule Commonly known as:  FLOMAX Take 0.4 mg by mouth 2 (two) times daily.   warfarin 5 MG tablet Commonly known as:  COUMADIN TAKE AS DIRECTED BY COUMADIN CLINIC What changed:  See the new instructions.       Disposition and follow-up:   Brandon B Navarez Jr. was discharged from [facility] in  Good condition.  At the hospital follow up visit please address:  1.  His cough and SOB  2.  Labs / imaging needed at time of follow-up: BMP  3.  Pending labs/ test needing follow-up: None  Follow-up Appointments: Follow-up Information    Nilda Simmer, MD .   Specialty:  Family Medicine Why:  Please F/U with your Doctor and for potassium check. Contact information: [id number] [facility] [address] [phone]           Hospital Course by problem list: Mr. Vandyken. 65 y.o manhis past medical history significant for hypertension,atrial fibrillation s/pablation,systolic heart failure and Obstructive sleep apneacame to the ED with history of worsening cough and shortness of breath last 2 weeks. Bronchitis.He states that he developed this persistent worsening cough with some white sputum  2-3 weeks ago. He also developed some nasal congestion and postnasal drip. The last 2-3 days her cough really worsens and he developed worsening shortness of breath, he states that he is unable to lay flat as as he was becoming short of breath. Last night he was sleeping in a recliner. He was seen at PCP office by a PA on Monday, who did a chest x-ray and told him that it is unremarkable. They also told him to go for a CT chest.  On presentation to the ER he was noted to have bilateral wheezes,  and was giving a continuous nebulizer. Towards the end of this treatment patient reports he began to feel lightheaded and dizzy, and had a brief syncopal episode. At that time his heart rate was noted to be elevated in the 170s, and showed SVT on telemetry. He was diaphoretic and nauseated at that time. Plans are made to give him adenosine, but he spontaneously converted back to normal sinus rhythm on his own. Pressure was noted to be in the [id number] systolic, and was given IV fluid boluses with improvement in BP. Is also given Zofran for nausea. He came back to his baseline while  later that day. He had an history of systolic heart failure, according to the previous echo his ejection fraction was 45% he never showed any sign of volume overload clinically, and his chest x-ray and BNP was negative for any acute exacerbation.  Next morning he was still having some cough and mucus production. He was really concerned about his dog was home alone for the last 24 hours. And he told us that if he will not discharge him he will leave an AMA. We discharged him on Z pack, Mucinex DM, Flonase and Claritin for his upper respiratory symptoms.  Hypokalemia. He was found to have low potassium next day at 2.7. Most probably due to that albuterol therapy. He was supposed to get 4 rounds of 10 mEq potassium IV . As he wants to leave early, he got just one of 10 mEq potassium IV and 40 mEq potassium orally.  History of Atrial fibrillation. He underwent ablation earlier this year and has maintained sinus rhythm since that time. Since his ablation he has been maintained on amiodarone 200 mg daily, Coreg 6.25 mg twice a day, and Coumadin for anticoagulation.We Continued his home medications.  Hypertension. He is on amlodipine 10 mg daily and losartan 100 mg daily at home for his blood pressure control. Because of his softer blood pressure we hold his antihypertensives. They were restarted on discharge.  Insomnia. He states that he was unable to sleep during his previous hospital admissions. He was given Ambien PRN.  Discharge Vitals:   BP 122/62 (BP Location: Left Arm)   Pulse 70   Temp 98.5 F (36.9 C) (Oral)   Resp (!) 23   Ht 6\' 4"  (1.93 m)   Wt 248 lb (112.5 kg)   SpO2 98%   BMI 30.19 kg/m   Gen. well-built, well-nourished man, looking very anxious and disturbed. Lungs. Clear bilaterally, with some transmitted upper respiratory sounds. CV. Regular rate and rhythm Abdomen. Soft, nontender, bowel sounds positive. Extremities. No edema, no cyanosis, pulses 2+  bilaterally.  Pertinent Labs, Studies, and Procedures:  CBC Latest Ref Rng & Units 10/24/2015 10/23/2015 07/23/2015  WBC 4.0 - 10.5 K/uL 7.6 7.8 6.2  Hemoglobin 13.0 - 17.0 g/dL 12.2(L) 14.8 15.1  Hematocrit 39.0 - 52.0 % 35.7(L) 42.0 44.4  Platelets 150 - 400 K/uL 163 179 198   BMP Latest Ref Rng & Units 10/24/2015 10/23/2015 08/06/2015  Glucose 65 - 99 mg/dL 94 105(H) 89  BUN 6 - 20 mg/dL 13 16 21   Creatinine 0.61 - 1.24 mg/dL 0.79 0.86 1.02  Sodium 135 - 145 mmol/L 142 140 139  Potassium 3.5 - 5.1 mmol/L 2.7(LL) 3.0(L) 3.4(L)  Chloride 101 - 111 mmol/L 105 102 102  CO2 22 - 32 mmol/L 30 27 30   Calcium 8.9 - 10.3 mg/dL 8.6(L) 9.0 8.8   ABG    Component Value Date/Time   PHART 7.465 (  H) 10/23/2015 1544   PCO2ART 33.1 10/23/2015 1544   PO2ART 53.0 (L) 10/23/2015 1544   HCO3 23.9 10/23/2015 1544   TCO2 25 10/23/2015 1544   O2SAT 90.0 10/23/2015 1544   Mag. 1.8  BNP.91.6  CXR.  FINDINGS: Grossly unchanged borderline enlarged cardiac silhouette and mediastinal contours with tortuosity and potential ectasia of the thoracic aorta. There is persistent mild elevation / eventration of the bilateral hemidiaphragms. No focal airspace opacities. No pleural effusion or pneumothorax. No evidence of edema. No acute osseous abnormalities. Old right mid shaft clavicular fracture with apparent pseudoarthrosis.  IMPRESSION: No acute cardiopulmonary disease.  EKG.Initial EKG was showing sinus tachycardia, and later during SVT he develops new changes consistent with inferolateral ischemia which resolved on subsequent EKG.  Discharge Instructions: Discharge Instructions    Diet - low sodium heart healthy    Complete by:  As directed    Discharge instructions    Complete by:  As directed    Please your medicine as directed. Please follow up with your Doctor, if your symptoms persist and to check your potassium levels.   Increase activity slowly    Complete by:  As directed        Signed: Lorella Nimrod, MD 10/25/2015, 10:57 AM   Pager: [phone]

## 2015-10-25 NOTE — ED Provider Notes (Signed)
[facility] DEPT Provider Note   CSN: [id number] Arrival date & time: 10/25/15  1051     History   Chief Complaint No chief complaint on file.   HPI Brandon [name]. is a 65 y.o. male.  HPI  The patient is a 65 year old male, he has a history of recently being admitted to the hospital for shortness of breath and tachycardia, found to have SVT, converted successfully, also found to have hypokalemia, he was unable to stay for treatment because of some circumstances at home and thus he left the hospital to return today for a recheck of his potassium level. He states that he feels generally mildly fatigued but has no focal symptoms, his shortness of breath is improving, he has been taking Mucinex, denies swelling of the legs, chest pain, fever, blurred vision or headache.   Past Medical History:  Diagnosis Date  . Anxiety   . Arthritis   . Benign neoplasm of colon   . Benign prostatic hyperplasia with urinary obstruction   . Bilateral pulmonary embolism ([place]) 3/14   admitted to [facility],  treated with Xarelto  . Cataract   . Chronic pain   . Hyperlipemia   . Hypertension   . Insomnia   . Major depressive disorder, recurrent episode ([place])   . Mitral regurgitation 04/24/2013   Mild by TEE  . Obesity   . OSA (obstructive sleep apnea)    noncompliant with CPAP.  07/27/13- awaiting a CPAP,  . Osteoporosis   . Persistent atrial fibrillation ([place])   . Restless leg syndrome    takes gabapentin  . Sciatica   . Thrombus of left atrial appendage 03/09/2013  . Ventral hernia, unspecified, without mention of obstruction or gangrene    right abdominal wall    Patient Active Problem List   Diagnosis Date Noted  . Dyspnea 10/23/2015  . A-fib ([place]) 07/30/2015  . BP (high blood pressure) 10/26/2014  . Urinary urgency 06/12/2014  . Rotator cuff tear 03/08/2014  . Restless leg syndrome 02/08/2014  . Restless leg 02/08/2014  . Preop cardiovascular exam 07/20/2013  . Recurrent right  inguinal hernia 07/17/2013  . Hernia, inguinal, unilateral, recurrent 07/17/2013  . Right groin pain 06/23/2013  . Atrial fibrillation, persistent ([place]) 04/26/2013  . Persistent atrial fibrillation ([place]) 04/26/2013  . Mitral regurgitation 04/25/2013  . MI (mitral incompetence) 04/25/2013  . Encounter for therapeutic drug monitoring 04/04/2013  . Atrial fibrillation, recurrent, persistent 03/14/2013  . Embolism and thrombosis of unspecified site 03/14/2013  . Angiopathy 03/14/2013  . Obstructive sleep apnea 03/09/2013  . Thrombus of left atrial appendage 03/09/2013  . Obstructive apnea 03/09/2013  . Right wrist injury 03/21/2012  . Injury of foot, left 03/21/2012  . Hip pain 03/21/2012  . Back pain 03/21/2012  . Other and unspecified hyperlipidemia 03/21/2012  . Arthralgia of hip 03/21/2012  . HLD (hyperlipidemia) 03/21/2012  . Pulmonary embolism ([place]) 03/19/2012  . PE (pulmonary embolism) 03/19/2012  . Acute delirium 03/17/2012  . Bilateral inguinal hernia 05/29/2011  . Umbilical hernia 42/70/6237  . 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 UPPER RESPIRATORY INFECTION, VIRAL 06/20/2008  . EXTRINSIC ASTHMA, UNSPECIFIED 06/20/2008  . Asthma, exogenous 06/20/2008  . COUGH DUE TO ACE INHIBITORS 09/19/2007  . Essential hypertension 08/15/2007  . ANXIETY 07/18/2007    Past Surgical History:  Procedure Laterality Date  . CARDIOVERSION N/A 03/21/2012   Procedure: CARDIOVERSION;  Surgeon: Birdie Riddle, MD;  Location: [facility] ENDOSCOPY;  Service: Cardiovascular;  Laterality: N/A;  . CARDIOVERSION N/A 04/24/2013  Procedure: CARDIOVERSION;  Surgeon: Dorothy Spark, MD;  Location: [facility] ENDOSCOPY;  Service: Cardiovascular;  Laterality: N/A;  . CARDIOVERSION N/A 06/27/2015   Procedure: CARDIOVERSION;  Surgeon: Larey Dresser, MD;  Location: [facility];  Service: Cardiovascular;  Laterality: N/A;  . CATARACT EXTRACTION    . COLONOSCOPY W/ POLYPECTOMY    . ELECTROPHYSIOLOGIC STUDY N/A 07/30/2015    Procedure: Atrial Fibrillation Ablation;  Surgeon: Thompson Grayer, MD;  Location: [facility] CV LAB;  Service: Cardiovascular;  Laterality: N/A;  . EYE SURGERY Right    cataract  . FEMUR FRACTURE SURGERY    . HERNIA REPAIR  07/06/2011  . INGUINAL HERNIA REPAIR Right 07/28/2013   Procedure: RIGHT INGUINAL HERNIA REPAIR;  Surgeon: Imogene Burn. Georgette Dover, MD;  Location: [facility];  Service: General;  Laterality: Right;  . INSERTION OF MESH Right 07/28/2013   Procedure: INSERTION OF MESH;  Surgeon: Imogene Burn. Georgette Dover, MD;  Location: [facility];  Service: General;  Laterality: Right;  . KNEE ARTHROSCOPY     left  . REPLACEMENT TOTAL KNEE Left   . ROTATOR CUFF REPAIR     right  . ROTATOR CUFF REPAIR Left 03/08/2014   DR SUPPLE  . SHOULDER ARTHROSCOPY WITH ROTATOR CUFF REPAIR AND SUBACROMIAL DECOMPRESSION Left 03/08/2014   Procedure: LEFT SHOULDER ARTHROSCOPY WITH ROTATOR CUFF REPAIR/SUBACROMIAL DECOMPRESSION/DISTAL CLAVICLE RESECTION;  Surgeon: Marin Shutter, MD;  Location: [facility];  Service: Orthopedics;  Laterality: Left;  . TEE WITHOUT CARDIOVERSION N/A 03/21/2012   Procedure: TRANSESOPHAGEAL ECHOCARDIOGRAM (TEE);  Surgeon: Birdie Riddle, MD;  Location: [facility];  Service: Cardiovascular;  Laterality: N/A;  . TEE WITHOUT CARDIOVERSION N/A 04/24/2013   Procedure: TRANSESOPHAGEAL ECHOCARDIOGRAM (TEE);  Surgeon: Dorothy Spark, MD;  Location: [facility];  Service: Cardiovascular;  Laterality: N/A;  . TEE WITHOUT CARDIOVERSION N/A 07/29/2015   Procedure: TRANSESOPHAGEAL ECHOCARDIOGRAM (TEE);  Surgeon: Fay Records, MD;  Location: [facility] ENDOSCOPY;  Service: Cardiovascular;  Laterality: N/A;  . TONSILLECTOMY         Home Medications    Prior to Admission medications   Medication Sig Start Date End Date Taking? Authorizing Provider  amiodarone (PACERONE) 200 MG tablet Take 1 tablet (200 mg total) by mouth daily. 07/03/15   Thompson Grayer, MD  amLODipine (NORVASC) 10 MG tablet Take 10 mg by mouth daily.     Historical Provider, MD  azithromycin (ZITHROMAX Z-PAK) 250 MG tablet Take 2 tablets on day one, then one daily for another four days. 10/24/15   Lorella Nimrod, MD  carvedilol (COREG) 6.25 MG tablet Take 1 tablet (6.25 mg total) by mouth 2 (two) times daily. 07/03/15   Thompson Grayer, MD  dextromethorphan-guaiFENesin (MUCINEX DM) 30-600 MG 12hr tablet Take 2 tablets by mouth 2 (two) times daily. 10/24/15   Lorella Nimrod, MD  escitalopram (LEXAPRO) 20 MG tablet Take 20 mg by mouth daily.    Historical Provider, MD  fluticasone (FLONASE) 50 MCG/ACT nasal spray Place 2 sprays into both nostrils daily. 10/24/15   Lorella Nimrod, MD  gabapentin (NEURONTIN) 800 MG tablet TAKE 1 TABLET BY MOUTH DAILY WITH SUPPER AND 1 TABLET AT BEDTIME AS NEEDED. Patient taking differently: TAKE 1 TABLET BY MOUTH DAILY WITH SUPPER AND 1 TABLET AT BEDTIME AS NEEDED FOR NERVE PAIN 07/03/15   Britt Bottom, MD  loratadine (CLARITIN) 10 MG tablet Take 1 tablet (10 mg total) by mouth daily. 10/24/15   Lorella Nimrod, MD  losartan (COZAAR) 100 MG tablet Take 100 mg by mouth daily.  Historical Provider, MD  pantoprazole (PROTONIX) 40 MG tablet Take 1 tablet (40 mg total) by mouth daily. 07/31/15   Amber Sena Slate, NP  tamsulosin (FLOMAX) 0.4 MG CAPS capsule Take 0.4 mg by mouth 2 (two) times daily.     Historical Provider, MD  warfarin (COUMADIN) 5 MG tablet TAKE AS DIRECTED BY COUMADIN CLINIC Patient taking differently: PT TAKES 2.5MG ON M, W, F - PT TAKES 5MG ALL OTHER DAYS 07/03/15   Thompson Grayer, MD    Family History Family History  Problem Relation Age of Onset  . Cancer Father     bone  . Cancer Paternal Grandmother     ovarian  . CVA Other     Fam Hx of multiple myeloma  . Diabetes Other     Fam Hx of DM    Social History Social History  Substance Use Topics  . Smoking status: Former Research scientist (life sciences)  . Smokeless tobacco: Never Used  . Alcohol use 0.0 oz/week     Comment: 2 drinks per night     Allergies   Ace  inhibitors   Review of Systems Review of Systems  All other systems reviewed and are negative.    Physical Exam Updated Vital Signs Pulse 111   Temp 98.5 F (36.9 C) (Oral)   Resp 16   SpO2 93%   Physical Exam  Constitutional: He appears well-developed and well-nourished. No distress.  HENT:  Head: Normocephalic and atraumatic.  Mouth/Throat: Oropharynx is clear and moist. No oropharyngeal exudate.  Eyes: Conjunctivae and EOM are normal. Pupils are equal, round, and reactive to light. Right eye exhibits no discharge. Left eye exhibits no discharge. No scleral icterus.  Neck: Normal range of motion. Neck supple. No JVD present. No thyromegaly present.  Cardiovascular: Normal rate, regular rhythm, normal heart sounds and intact distal pulses.  Exam reveals no gallop and no friction rub.   No murmur heard. Pulmonary/Chest: Effort normal and breath sounds normal. No respiratory distress. He has no wheezes. He has no rales.  Abdominal: Soft. Bowel sounds are normal. He exhibits no distension and no mass. There is no tenderness.  Musculoskeletal: Normal range of motion. He exhibits no edema or tenderness.  Lymphadenopathy:    He has no cervical adenopathy.  Neurological: He is alert. Coordination normal.  Normal strength and sensation in all 4 extremities, cranial nerves III through XII are normal, normal speech, normal insight, normal memory  Skin: Skin is warm and dry. No rash noted. No erythema.  Psychiatric: He has a normal mood and affect. His behavior is normal.  Nursing note and vitals reviewed.    ED Treatments / Results  Labs (all labs ordered are listed, but only abnormal results are displayed) Labs Reviewed - No data to display  EKG  EKG Interpretation  Date/Time:  Friday October 25 2015 11:01:24 EDT Ventricular Rate:  113 PR Interval:  180 QRS Duration: 108 QT Interval:  346 QTC Calculation: 474 R Axis:   -59 Text Interpretation:  Sinus tachycardia Left  anterior fascicular block Abnormal ECG since last tracing no significant change Confirmed by Isadore Palecek  MD, Renada Cronin ([id number]) on 10/25/2015 11:04:08 AM       Radiology Dg Chest 2 View  Result Date: 10/23/2015 CLINICAL DATA:  Increased shortness of breath several weeks, worse during the past 2 days. History of CHF and hypertension. EXAM: CHEST  2 VIEW COMPARISON:  11/21/2015; 05/26/2014; 03/16/2012; chest CT - 05/03/2013 FINDINGS: Grossly unchanged borderline enlarged cardiac silhouette and mediastinal contours with  tortuosity and potential ectasia of the thoracic aorta. There is persistent mild elevation / eventration of the bilateral hemidiaphragms. No focal airspace opacities. No pleural effusion or pneumothorax. No evidence of edema. No acute osseous abnormalities. Old right mid shaft clavicular fracture with apparent pseudoarthrosis. IMPRESSION: No acute cardiopulmonary disease. Electronically Signed   By: Sandi Mariscal M.D.   On: 10/23/2015 12:15    Procedures Procedures (including critical care time)  Medications Ordered in ED Medications - No data to display   Initial Impression / Assessment and Plan / ED Course  I have reviewed the triage vital signs and the nursing notes.  Pertinent labs & imaging results that were available during my care of the patient were reviewed by me and considered in my medical decision making (see chart for details).  Clinical Course   Well-appearing, hypokalemia, as witnessed by prior lab values it was 2.8 prior to discharge, he comes back in today at recommendation of the discharging physicians for a recheck of the potassium. He reports that he has not been taking potassium at home  Mag and K given, pt well appearing,  Stable for d/c. Pt understands indications for return.  Final Clinical Impressions(s) / ED Diagnoses   Final diagnoses:  Hypokalemia    New Prescriptions New Prescriptions   No medications on file     Noemi Chapel, MD 10/25/15  1419

## 2015-10-30 ENCOUNTER — Other Ambulatory Visit: Payer: Self-pay

## 2015-10-30 ENCOUNTER — Other Ambulatory Visit: Payer: Self-pay | Admitting: Internal Medicine

## 2015-10-30 DIAGNOSIS — I4819 Other persistent atrial fibrillation: Secondary | ICD-10-CM

## 2015-10-30 MED ORDER — AMIODARONE HCL 200 MG PO TABS: 200.0000 mg | ORAL_TABLET | Freq: Every day | ORAL | 1 refills | Status: DC

## 2015-10-31 ENCOUNTER — Ambulatory Visit (INDEPENDENT_AMBULATORY_CARE_PROVIDER_SITE_OTHER): Payer: Medicare Other | Admitting: *Deleted

## 2015-10-31 ENCOUNTER — Encounter: Payer: Self-pay | Admitting: Internal Medicine

## 2015-10-31 ENCOUNTER — Other Ambulatory Visit: Payer: Self-pay

## 2015-10-31 ENCOUNTER — Ambulatory Visit (INDEPENDENT_AMBULATORY_CARE_PROVIDER_SITE_OTHER): Payer: Medicare Other | Admitting: Internal Medicine

## 2015-10-31 ENCOUNTER — Telehealth: Payer: Self-pay | Admitting: *Deleted

## 2015-10-31 VITALS — BP 124/86 | HR 64 | Ht 76.0 in | Wt 252.8 lb

## 2015-10-31 DIAGNOSIS — R062 Wheezing: Secondary | ICD-10-CM | POA: Diagnosis not present

## 2015-10-31 DIAGNOSIS — I4819 Other persistent atrial fibrillation: Secondary | ICD-10-CM

## 2015-10-31 DIAGNOSIS — I481 Persistent atrial fibrillation: Secondary | ICD-10-CM | POA: Diagnosis not present

## 2015-10-31 DIAGNOSIS — Z5181 Encounter for therapeutic drug level monitoring: Secondary | ICD-10-CM

## 2015-10-31 DIAGNOSIS — I10 Essential (primary) hypertension: Secondary | ICD-10-CM | POA: Diagnosis not present

## 2015-10-31 DIAGNOSIS — G4733 Obstructive sleep apnea (adult) (pediatric): Secondary | ICD-10-CM | POA: Diagnosis not present

## 2015-10-31 DIAGNOSIS — I480 Paroxysmal atrial fibrillation: Secondary | ICD-10-CM | POA: Diagnosis not present

## 2015-10-31 LAB — POCT INR: INR: 2.5

## 2015-10-31 MED ORDER — ALBUTEROL SULFATE HFA 108 (90 BASE) MCG/ACT IN AERS: 2.0000 | INHALATION_SPRAY | Freq: Four times a day (QID) | RESPIRATORY_TRACT | Status: DC | PRN

## 2015-10-31 MED ORDER — ALBUTEROL SULFATE HFA 108 (90 BASE) MCG/ACT IN AERS: 2.0000 | INHALATION_SPRAY | Freq: Four times a day (QID) | RESPIRATORY_TRACT | 2 refills | Status: DC | PRN

## 2015-10-31 NOTE — Patient Instructions (Addendum)
Medication Instructions:  Your physician has recommended you make the following change in your medication: 1) Stop Amiodarone   Labwork: None ordered   Testing/Procedures: None ordered   Follow-Up: Your physician recommends that you schedule a follow-up appointment in: 3 months with Dr Allred      

## 2015-10-31 NOTE — Telephone Encounter (Signed)
Order filled to outpatient

## 2015-10-31 NOTE — Progress Notes (Signed)
Electrophysiology Office Note   Date:  10/31/2015   ID:  Brandon Sotello., DOB Jan 17, 1950, MRN [id number]  PCP:  Nilda Simmer, MD   Primary Electrophysiologist: Thompson Grayer, MD    Chief Complaint  Patient presents with  . Atrial Fibrillation     History of Present Illness: Brandon Haider. is a 65 y.o. male who presents today for electrophysiology evaluation.  He has done well since his recent ablation.   Denies procedure related complications.  No further afib.  He is pleased with this. Over the past week, he has developed bronchitis.  He was treated with Zpak.  He continues to have wheezing/ SOB at rest.  Today, he denies symptoms of chest pain, orthopnea, PND, lower extremity edema, claudication, dizziness, presyncope, syncope, bleeding, or neurologic sequela. The patient is tolerating medications without difficulties and is otherwise without complaint today.    Past Medical History:  Diagnosis Date  . Anxiety   . Arthritis   . Benign neoplasm of colon   . Benign prostatic hyperplasia with urinary obstruction   . Bilateral pulmonary embolism ([place]) 3/14   admitted to [facility],  treated with Xarelto  . Cataract   . Chronic pain   . Hyperlipemia   . Hypertension   . Insomnia   . Major depressive disorder, recurrent episode ([place])   . Mitral regurgitation 04/24/2013   Mild by TEE  . Obesity   . OSA (obstructive sleep apnea)    noncompliant with CPAP.  07/27/13- awaiting a CPAP,  . Osteoporosis   . Persistent atrial fibrillation ([place])   . Restless leg syndrome    takes gabapentin  . Sciatica   . Thrombus of left atrial appendage 03/09/2013  . Ventral hernia, unspecified, without mention of obstruction or gangrene    right abdominal wall   Past Surgical History:  Procedure Laterality Date  . CARDIOVERSION N/A 03/21/2012   Procedure: CARDIOVERSION;  Surgeon: Birdie Riddle, MD;  Location: [facility] ENDOSCOPY;  Service: Cardiovascular;  Laterality: N/A;  . CARDIOVERSION N/A  04/24/2013   Procedure: CARDIOVERSION;  Surgeon: Dorothy Spark, MD;  Location: [facility] ENDOSCOPY;  Service: Cardiovascular;  Laterality: N/A;  . CARDIOVERSION N/A 06/27/2015   Procedure: CARDIOVERSION;  Surgeon: Larey Dresser, MD;  Location: [place];  Service: Cardiovascular;  Laterality: N/A;  . CATARACT EXTRACTION    . COLONOSCOPY W/ POLYPECTOMY    . ELECTROPHYSIOLOGIC STUDY N/A 07/30/2015   Procedure: Atrial Fibrillation Ablation;  Surgeon: Thompson Grayer, MD;  Location: [place] CV LAB;  Service: Cardiovascular;  Laterality: N/A;  . EYE SURGERY Right    cataract  . FEMUR FRACTURE SURGERY    . HERNIA REPAIR  07/06/2011  . INGUINAL HERNIA REPAIR Right 07/28/2013   Procedure: RIGHT INGUINAL HERNIA REPAIR;  Surgeon: Imogene Burn. Georgette Dover, MD;  Location: [place];  Service: General;  Laterality: Right;  . INSERTION OF MESH Right 07/28/2013   Procedure: INSERTION OF MESH;  Surgeon: Imogene Burn. Georgette Dover, MD;  Location: [place];  Service: General;  Laterality: Right;  . KNEE ARTHROSCOPY     left  . REPLACEMENT TOTAL KNEE Left   . ROTATOR CUFF REPAIR     right  . ROTATOR CUFF REPAIR Left 03/08/2014   DR SUPPLE  . SHOULDER ARTHROSCOPY WITH ROTATOR CUFF REPAIR AND SUBACROMIAL DECOMPRESSION Left 03/08/2014   Procedure: LEFT SHOULDER ARTHROSCOPY WITH ROTATOR CUFF REPAIR/SUBACROMIAL DECOMPRESSION/DISTAL CLAVICLE RESECTION;  Surgeon: Marin Shutter, MD;  Location: [place];  Service: Orthopedics;  Laterality: Left;  .  TEE WITHOUT CARDIOVERSION N/A 03/21/2012   Procedure: TRANSESOPHAGEAL ECHOCARDIOGRAM (TEE);  Surgeon: Birdie Riddle, MD;  Location: [place];  Service: Cardiovascular;  Laterality: N/A;  . TEE WITHOUT CARDIOVERSION N/A 04/24/2013   Procedure: TRANSESOPHAGEAL ECHOCARDIOGRAM (TEE);  Surgeon: Dorothy Spark, MD;  Location: [place];  Service: Cardiovascular;  Laterality: N/A;  . TEE WITHOUT CARDIOVERSION N/A 07/29/2015   Procedure: TRANSESOPHAGEAL ECHOCARDIOGRAM (TEE);  Surgeon: Fay Records, MD;   Location: [facility] ENDOSCOPY;  Service: Cardiovascular;  Laterality: N/A;  . TONSILLECTOMY       Current Outpatient Prescriptions  Medication Sig Dispense Refill  . amiodarone (PACERONE) 200 MG tablet Take 1 tablet (200 mg total) by mouth daily. 90 tablet 1  . amLODipine (NORVASC) 10 MG tablet Take 10 mg by mouth daily.    . carvedilol (COREG) 6.25 MG tablet Take 1 tablet (6.25 mg total) by mouth 2 (two) times daily. 180 tablet 3  . cyclobenzaprine (FLEXERIL) 5 MG tablet Take 1 tablet by mouth as directed. Take as needed    . dextromethorphan-guaiFENesin (MUCINEX DM) 30-600 MG 12hr tablet Take 2 tablets by mouth 2 (two) times daily. 30 tablet 0  . escitalopram (LEXAPRO) 20 MG tablet Take 20 mg by mouth daily.    . fluticasone (FLONASE) 50 MCG/ACT nasal spray Place 2 sprays into both nostrils daily. 16 g 2  . gabapentin (NEURONTIN) 800 MG tablet TAKE 1 TABLET BY MOUTH DAILY WITH SUPPER AND 1 TABLET AT BEDTIME AS NEEDED FOR NERVE PAIN    . loratadine (CLARITIN) 10 MG tablet Take 1 tablet (10 mg total) by mouth daily. 30 tablet 0  . losartan (COZAAR) 100 MG tablet Take 100 mg by mouth daily.    . pantoprazole (PROTONIX) 40 MG tablet Take 1 tablet (40 mg total) by mouth daily. 45 tablet 0  . tamsulosin (FLOMAX) 0.4 MG CAPS capsule Take 0.4 mg by mouth 2 (two) times daily.     Marland Kitchen warfarin (COUMADIN) 5 MG tablet TAKE AS DIRECTED BY COUMADIN CLINIC (Patient taking differently: PT TAKES 2.5MG  ON M, W, F - PT TAKES 5MG  ALL OTHER DAYS) 30 tablet 3   No current facility-administered medications for this visit.     Allergies:   Ace inhibitors   Social History:  The patient  reports that he has quit smoking. He has never used smokeless tobacco. He reports that he drinks alcohol. He reports that he does not use drugs.   Family History:  The patient's  family history includes CVA in his other; Cancer in his father and paternal grandmother; Diabetes in his other.    ROS:  Please see the history of present  illness.   All other systems are reviewed and negative.    PHYSICAL EXAM: VS:  BP 124/86   Pulse 64   Ht 6\' 4"  (1.93 m)   Wt 252 lb 12.8 oz (114.7 kg)   BMI 30.77 kg/m  , BMI Body mass index is 30.77 kg/m. GEN: Well nourished, well developed, in no acute distress  HEENT: normal  Neck: no JVD, carotid bruits, or masses Cardiac: RRR; no murmurs, rubs, or gallops,no edema  Respiratory:  Diffuse expiratory wheezes which appear upper airway in nature, normal work of breathing GI: soft, nontender, nondistended, + BS MS: no deformity or atrophy  Skin: warm and dry  Neuro:  Strength and sensation are intact Psych: euthymic mood, full affect  EKG:  EKG is ordered today. The ekg ordered today shows sinus rhythm 64 bpm, PR 244 msec,  septal infarct pattern   Recent Labs: 06/05/2015: ALT 13; TSH 2.07 10/23/2015: B Natriuretic Peptide 91.6; Magnesium 1.8 10/24/2015: Hemoglobin 12.2; Platelets 163 10/25/2015: BUN 11; Creatinine, Ser 0.97; Potassium 3.1; Sodium 139    Lipid Panel     Component Value Date/Time   CHOL 170 03/17/2012 0432   TRIG 117 03/17/2012 0432   HDL 30 (L) 03/17/2012 0432   CHOLHDL 5.7 03/17/2012 0432   VLDL 23 03/17/2012 0432   LDLCALC 117 (H) 03/17/2012 0432     Wt Readings from Last 3 Encounters:  10/31/15 252 lb 12.8 oz (114.7 kg)  10/25/15 248 lb (112.5 kg)  10/23/15 248 lb (112.5 kg)    ASSESSMENT AND PLAN:  1.  Persistent afib Doing very well s/p ablation Stop amiodarone Continue on anticoagulation for chads2vasc score of at least 2.  He has had prior LAA thrombus and thus should probably continue anticoagulation for life.  2. HTN Stable No change required today  3. OSA Stable No change required today  4. Bronchitis Very wheezy on exam Will given albuterol inhaler 1 puff QID prn Follow-up with PCP if not improved.  Current medicines are reviewed at length with the patient today.   The patient does not have concerns regarding his  medicines.  The following changes were made today:  none  Signed, Thompson Grayer, MD  10/31/2015 9:58 AM     [address] [place] [address] [phone] (office) [phone] (fax)

## 2015-11-18 ENCOUNTER — Telehealth: Payer: Self-pay | Admitting: Pharmacist

## 2015-11-18 DIAGNOSIS — Z860100 Personal history of colon polyps, unspecified: Secondary | ICD-10-CM

## 2015-11-18 HISTORY — DX: Personal history of colon polyps, unspecified: Z86.0100

## 2015-11-18 HISTORY — DX: Personal history of colonic polyps: Z86.010

## 2015-11-18 NOTE — Telephone Encounter (Signed)
Received clearance from [facility] that pt needs a colonoscopy. Request to hold Coumadin for 5 days prior. Pt is now 3+ months after his ablation on 7/25 so ok to hold anticoag in that regard. He had a PE in 2014 and a thrombus of the left atrial appendage in March 2015. The LAA thrombus occurred while he was taking Xarelto and he was switched back to Coumadin after. He will need a Lovenox bridge if he holds Coumadin for 5 days. He has been bridged multiple times in the past. Will coordinate in Coumadin clinic. Clearance faxed to [facility] [phone].

## 2015-11-21 ENCOUNTER — Ambulatory Visit (INDEPENDENT_AMBULATORY_CARE_PROVIDER_SITE_OTHER): Payer: Medicare Other | Admitting: *Deleted

## 2015-11-21 DIAGNOSIS — I480 Paroxysmal atrial fibrillation: Secondary | ICD-10-CM | POA: Diagnosis not present

## 2015-11-21 DIAGNOSIS — I4819 Other persistent atrial fibrillation: Secondary | ICD-10-CM

## 2015-11-21 DIAGNOSIS — Z5181 Encounter for therapeutic drug level monitoring: Secondary | ICD-10-CM

## 2015-11-21 DIAGNOSIS — I481 Persistent atrial fibrillation: Secondary | ICD-10-CM | POA: Diagnosis not present

## 2015-11-21 LAB — POCT INR: INR: 2

## 2015-11-25 ENCOUNTER — Telehealth: Payer: Self-pay | Admitting: Internal Medicine

## 2015-11-25 NOTE — Telephone Encounter (Signed)
New message      Request for surgical clearance:  What type of surgery is being performed?  colonoscopy 1. When is this surgery scheduled?pending clearance  Are there any medications that need to be held prior to surgery and how long? Asking for risk assessment for coumadin 2. Name of physician performing surgery?  Dr Truman Hayward  What is your office phone and fax number? Fax [phone]

## 2015-11-26 NOTE — Telephone Encounter (Signed)
11/18/15 2:25 PM    [place] [facility] contacted [employer], [place], [facility]      11/18/15 2:20 PM  Note    Received clearance from [facility] that pt needs a colonoscopy. Request to hold Coumadin for 5 days prior. Pt is now 3+ months after his ablation on 7/25 so ok to hold anticoag in that regard. He had a PE in 2014 and a thrombus of the left atrial appendage in March 2015. The LAA thrombus occurred while he was taking Xarelto and he was switched back to Coumadin after. He will need a Lovenox bridge if he holds Coumadin for 5 days. He has been bridged multiple times in the past. Will coordinate in Coumadin clinic. Clearance faxed to [facility] [phone].

## 2015-11-26 NOTE — Telephone Encounter (Signed)
This was already completed and faxed over a week ago. Please see clearance note from 11/13.

## 2015-12-05 DIAGNOSIS — L570 Actinic keratosis: Secondary | ICD-10-CM | POA: Diagnosis not present

## 2015-12-05 DIAGNOSIS — D492 Neoplasm of unspecified behavior of bone, soft tissue, and skin: Secondary | ICD-10-CM | POA: Diagnosis not present

## 2015-12-05 DIAGNOSIS — D239 Other benign neoplasm of skin, unspecified: Secondary | ICD-10-CM | POA: Diagnosis not present

## 2015-12-19 ENCOUNTER — Ambulatory Visit (INDEPENDENT_AMBULATORY_CARE_PROVIDER_SITE_OTHER): Payer: Medicare Other | Admitting: Pharmacist

## 2015-12-19 DIAGNOSIS — Z5181 Encounter for therapeutic drug level monitoring: Secondary | ICD-10-CM

## 2015-12-19 DIAGNOSIS — I480 Paroxysmal atrial fibrillation: Secondary | ICD-10-CM | POA: Diagnosis not present

## 2015-12-19 LAB — POCT INR: INR: 2

## 2016-01-04 ENCOUNTER — Other Ambulatory Visit: Payer: Self-pay | Admitting: Internal Medicine

## 2016-01-14 DIAGNOSIS — Z6830 Body mass index (BMI) 30.0-30.9, adult: Secondary | ICD-10-CM | POA: Diagnosis not present

## 2016-01-14 DIAGNOSIS — J209 Acute bronchitis, unspecified: Secondary | ICD-10-CM | POA: Diagnosis not present

## 2016-01-29 ENCOUNTER — Telehealth: Payer: Self-pay | Admitting: Internal Medicine

## 2016-01-29 NOTE — Telephone Encounter (Signed)
Appt for INR check tomorrow. Will coordinate Lovenox bridge at that appt.

## 2016-01-29 NOTE — Telephone Encounter (Signed)
Follow Up:   The pt is having procedure on 02-11-16.Pt needs you to arrange Lovenox Bridge 5 days prior to his procedure.

## 2016-01-30 ENCOUNTER — Ambulatory Visit (INDEPENDENT_AMBULATORY_CARE_PROVIDER_SITE_OTHER): Payer: Medicare Other | Admitting: *Deleted

## 2016-01-30 DIAGNOSIS — I480 Paroxysmal atrial fibrillation: Secondary | ICD-10-CM | POA: Diagnosis not present

## 2016-01-30 DIAGNOSIS — Z5181 Encounter for therapeutic drug level monitoring: Secondary | ICD-10-CM

## 2016-01-30 LAB — POCT INR: INR: 2

## 2016-02-05 ENCOUNTER — Encounter: Payer: Self-pay | Admitting: Internal Medicine

## 2016-02-05 ENCOUNTER — Ambulatory Visit (INDEPENDENT_AMBULATORY_CARE_PROVIDER_SITE_OTHER): Payer: Medicare Other | Admitting: Internal Medicine

## 2016-02-05 VITALS — BP 126/86 | HR 86 | Ht 76.0 in | Wt 254.2 lb

## 2016-02-05 DIAGNOSIS — I10 Essential (primary) hypertension: Secondary | ICD-10-CM | POA: Diagnosis not present

## 2016-02-05 DIAGNOSIS — I480 Paroxysmal atrial fibrillation: Secondary | ICD-10-CM | POA: Diagnosis not present

## 2016-02-05 NOTE — Progress Notes (Signed)
Electrophysiology Office Note   Date:  02/05/2016   ID:  Brandon [name]., DOB 31-Jan-1950, MRN [id number]  PCP:  Nilda Simmer, MD   Primary Electrophysiologist: Thompson Grayer, MD    Chief Complaint  Patient presents with  . Atrial Fibrillation     History of Present Illness: Brandon [name]. is a 66 y.o. male who presents today for electrophysiology follow-up.  He has done well since his last vist.   He has had no afib.   Today, he denies symptoms of chest pain, orthopnea, PND, lower extremity edema, claudication, dizziness, presyncope, syncope, bleeding, or neurologic sequela.  Wheezing and SOB have resolved.  The patient is tolerating medications without difficulties and is otherwise without complaint today.    Past Medical History:  Diagnosis Date  . Anxiety   . Arthritis   . Benign neoplasm of colon   . Benign prostatic hyperplasia with urinary obstruction   . Bilateral pulmonary embolism ([place]) 3/14   admitted to [facility],  treated with Xarelto  . Cataract   . Chronic pain   . Hyperlipemia   . Hypertension   . Insomnia   . Major depressive disorder, recurrent episode ([place])   . Mitral regurgitation 04/24/2013   Mild by TEE  . Obesity   . OSA (obstructive sleep apnea)    noncompliant with CPAP.  07/27/13- awaiting a CPAP,  . Osteoporosis   . Persistent atrial fibrillation ([place])   . Restless leg syndrome    takes gabapentin  . Sciatica   . Thrombus of left atrial appendage 03/09/2013  . Ventral hernia, unspecified, without mention of obstruction or gangrene    right abdominal wall   Past Surgical History:  Procedure Laterality Date  . CARDIOVERSION N/A 03/21/2012   Procedure: CARDIOVERSION;  Surgeon: Birdie Riddle, MD;  Location: [facility] ENDOSCOPY;  Service: Cardiovascular;  Laterality: N/A;  . CARDIOVERSION N/A 04/24/2013   Procedure: CARDIOVERSION;  Surgeon: Dorothy Spark, MD;  Location: [facility] ENDOSCOPY;  Service: Cardiovascular;  Laterality: N/A;  . CARDIOVERSION N/A  06/27/2015   Procedure: CARDIOVERSION;  Surgeon: Larey Dresser, MD;  Location: [place];  Service: Cardiovascular;  Laterality: N/A;  . CATARACT EXTRACTION    . COLONOSCOPY W/ POLYPECTOMY    . ELECTROPHYSIOLOGIC STUDY N/A 07/30/2015   Procedure: Atrial Fibrillation Ablation;  Surgeon: Thompson Grayer, MD;  Location: [place] CV LAB;  Service: Cardiovascular;  Laterality: N/A;  . EYE SURGERY Right    cataract  . FEMUR FRACTURE SURGERY    . HERNIA REPAIR  07/06/2011  . INGUINAL HERNIA REPAIR Right 07/28/2013   Procedure: RIGHT INGUINAL HERNIA REPAIR;  Surgeon: Imogene Burn. Georgette Dover, MD;  Location: [place];  Service: General;  Laterality: Right;  . INSERTION OF MESH Right 07/28/2013   Procedure: INSERTION OF MESH;  Surgeon: Imogene Burn. Georgette Dover, MD;  Location: [place];  Service: General;  Laterality: Right;  . KNEE ARTHROSCOPY     left  . REPLACEMENT TOTAL KNEE Left   . ROTATOR CUFF REPAIR     right  . ROTATOR CUFF REPAIR Left 03/08/2014   DR SUPPLE  . SHOULDER ARTHROSCOPY WITH ROTATOR CUFF REPAIR AND SUBACROMIAL DECOMPRESSION Left 03/08/2014   Procedure: LEFT SHOULDER ARTHROSCOPY WITH ROTATOR CUFF REPAIR/SUBACROMIAL DECOMPRESSION/DISTAL CLAVICLE RESECTION;  Surgeon: Marin Shutter, MD;  Location: [place];  Service: Orthopedics;  Laterality: Left;  . TEE WITHOUT CARDIOVERSION N/A 03/21/2012   Procedure: TRANSESOPHAGEAL ECHOCARDIOGRAM (TEE);  Surgeon: Birdie Riddle, MD;  Location: [place];  Service:  Cardiovascular;  Laterality: N/A;  . TEE WITHOUT CARDIOVERSION N/A 04/24/2013   Procedure: TRANSESOPHAGEAL ECHOCARDIOGRAM (TEE);  Surgeon: Dorothy Spark, MD;  Location: [place];  Service: Cardiovascular;  Laterality: N/A;  . TEE WITHOUT CARDIOVERSION N/A 07/29/2015   Procedure: TRANSESOPHAGEAL ECHOCARDIOGRAM (TEE);  Surgeon: Fay Records, MD;  Location: [facility] ENDOSCOPY;  Service: Cardiovascular;  Laterality: N/A;  . TONSILLECTOMY       Current Outpatient Prescriptions  Medication Sig Dispense Refill   . amLODipine (NORVASC) 10 MG tablet Take 10 mg by mouth daily.    . carvedilol (COREG) 6.25 MG tablet Take 1 tablet (6.25 mg total) by mouth 2 (two) times daily. 180 tablet 3  . cyclobenzaprine (FLEXERIL) 5 MG tablet Take 1 tablet by mouth as directed. Take as needed    . escitalopram (LEXAPRO) 20 MG tablet Take 20 mg by mouth daily.    Marland Kitchen gabapentin (NEURONTIN) 800 MG tablet TAKE 1 TABLET BY MOUTH DAILY WITH SUPPER AND 1 TABLET AT BEDTIME AS NEEDED FOR NERVE PAIN    . losartan (COZAAR) 100 MG tablet Take 100 mg by mouth daily.    . pantoprazole (PROTONIX) 40 MG tablet Take 1 tablet (40 mg total) by mouth daily. 45 tablet 0  . tamsulosin (FLOMAX) 0.4 MG CAPS capsule Take 0.4 mg by mouth 2 (two) times daily.     Marland Kitchen warfarin (COUMADIN) 5 MG tablet TAKE 1 TABLET AS DIRECTED BY COUMADIN CLINIC 30 tablet 3   No current facility-administered medications for this visit.     Allergies:   Ace inhibitors   Social History:  The patient  reports that he has quit smoking. He has never used smokeless tobacco. He reports that he drinks alcohol. He reports that he does not use drugs.   Family History:  The patient's  family history includes CVA in his other; Cancer in his father and paternal grandmother; Diabetes in his other.    ROS:  Please see the history of present illness.   All other systems are reviewed and negative.    PHYSICAL EXAM: VS:  BP 126/86   Pulse 86   Ht 6\' 4"  (1.93 m)   Wt 254 lb 3.2 oz (115.3 kg)   SpO2 92%   BMI 30.94 kg/m  , BMI Body mass index is 30.94 kg/m. GEN: Well nourished, well developed, in no acute distress  HEENT: normal  Neck: no JVD, carotid bruits, or masses Cardiac: RRR; no murmurs, rubs, or gallops,no edema  Respiratory:  Diffuse expiratory wheezes which appear upper airway in nature, normal work of breathing GI: soft, nontender, nondistended, + BS MS: no deformity or atrophy  Skin: warm and dry  Neuro:  Strength and sensation are intact Psych: euthymic  mood, full affect  EKG:  EKG is ordered today. The ekg ordered today shows sinus rhythm 84 bpm, PR 212 msec, LAHB   Recent Labs: 06/05/2015: ALT 13; TSH 2.07 10/23/2015: B Natriuretic Peptide 91.6; Magnesium 1.8 10/24/2015: Hemoglobin 12.2; Platelets 163 10/25/2015: BUN 11; Creatinine, Ser 0.97; Potassium 3.1; Sodium 139    Lipid Panel     Component Value Date/Time   CHOL 170 03/17/2012 0432   TRIG 117 03/17/2012 0432   HDL 30 (L) 03/17/2012 0432   CHOLHDL 5.7 03/17/2012 0432   VLDL 23 03/17/2012 0432   LDLCALC 117 (H) 03/17/2012 0432     Wt Readings from Last 3 Encounters:  02/05/16 254 lb 3.2 oz (115.3 kg)  10/31/15 252 lb 12.8 oz (114.7 kg)  10/25/15 248  lb (112.5 kg)    ASSESSMENT AND PLAN:  1.  Persistent afib Doing very well s/p ablation off AAD therapy Continue on anticoagulation for chads2vasc score of at least 2.  He has had prior LAA thrombus and thus should probably continue anticoagulation for life.  2. HTN Stable No change required today Minor swelling at times, likely due to amlodipine  3. OSA Stable No change required today   Current medicines are reviewed at length with the patient today.   The patient does not have concerns regarding his medicines.  The following changes were made today:  none  Signed, Thompson Grayer, MD  02/05/2016 9:09 AM     [facility] [address] [place] [address] [phone] (office) [phone] (fax)

## 2016-02-05 NOTE — Patient Instructions (Signed)
Medication Instructions:  Your physician recommends that you continue on your current medications as directed. Please refer to the Current Medication list given to you today.   Labwork: None Ordered  Testing/Procedures: None Ordered  Follow-Up: Your physician recommends that you schedule a follow-up appointment in: 3 MONTHS with Dr. Rayann Heman.   Any Other Special Instructions Will Be Listed Below (If Applicable).     If you need a refill on your cardiac medications before your next appointment, please call your pharmacy.

## 2016-02-06 ENCOUNTER — Ambulatory Visit: Payer: Medicare Other | Admitting: Internal Medicine

## 2016-02-18 DIAGNOSIS — M545 Low back pain: Secondary | ICD-10-CM | POA: Diagnosis not present

## 2016-02-18 DIAGNOSIS — M4302 Spondylolysis, cervical region: Secondary | ICD-10-CM | POA: Diagnosis not present

## 2016-02-18 DIAGNOSIS — M1612 Unilateral primary osteoarthritis, left hip: Secondary | ICD-10-CM | POA: Diagnosis not present

## 2016-03-07 ENCOUNTER — Inpatient Hospital Stay (HOSPITAL_COMMUNITY)
Admission: EM | Admit: 2016-03-07 | Discharge: 2016-03-09 | DRG: 309 | Disposition: A | Discharge status: Home / Self Care | Payer: Medicare Other | Attending: Internal Medicine | Admitting: Internal Medicine

## 2016-03-07 ENCOUNTER — Encounter (HOSPITAL_COMMUNITY): Payer: Self-pay | Admitting: Cardiology

## 2016-03-07 DIAGNOSIS — G8929 Other chronic pain: Secondary | ICD-10-CM | POA: Diagnosis present

## 2016-03-07 DIAGNOSIS — E785 Hyperlipidemia, unspecified: Secondary | ICD-10-CM | POA: Diagnosis present

## 2016-03-07 DIAGNOSIS — Z96652 Presence of left artificial knee joint: Secondary | ICD-10-CM | POA: Diagnosis present

## 2016-03-07 DIAGNOSIS — M810 Age-related osteoporosis without current pathological fracture: Secondary | ICD-10-CM | POA: Diagnosis present

## 2016-03-07 DIAGNOSIS — I214 Non-ST elevation (NSTEMI) myocardial infarction: Secondary | ICD-10-CM | POA: Diagnosis not present

## 2016-03-07 DIAGNOSIS — Z6829 Body mass index (BMI) 29.0-29.9, adult: Secondary | ICD-10-CM

## 2016-03-07 DIAGNOSIS — I248 Other forms of acute ischemic heart disease: Secondary | ICD-10-CM | POA: Diagnosis present

## 2016-03-07 DIAGNOSIS — F419 Anxiety disorder, unspecified: Secondary | ICD-10-CM | POA: Diagnosis present

## 2016-03-07 DIAGNOSIS — F1011 Alcohol abuse, in remission: Secondary | ICD-10-CM

## 2016-03-07 DIAGNOSIS — Z7901 Long term (current) use of anticoagulants: Secondary | ICD-10-CM

## 2016-03-07 DIAGNOSIS — I471 Supraventricular tachycardia, unspecified: Secondary | ICD-10-CM

## 2016-03-07 DIAGNOSIS — F1010 Alcohol abuse, uncomplicated: Secondary | ICD-10-CM | POA: Diagnosis present

## 2016-03-07 DIAGNOSIS — Z79899 Other long term (current) drug therapy: Secondary | ICD-10-CM

## 2016-03-07 DIAGNOSIS — I119 Hypertensive heart disease without heart failure: Secondary | ICD-10-CM | POA: Diagnosis present

## 2016-03-07 DIAGNOSIS — F339 Major depressive disorder, recurrent, unspecified: Secondary | ICD-10-CM | POA: Diagnosis present

## 2016-03-07 DIAGNOSIS — G4733 Obstructive sleep apnea (adult) (pediatric): Secondary | ICD-10-CM | POA: Diagnosis present

## 2016-03-07 DIAGNOSIS — N400 Enlarged prostate without lower urinary tract symptoms: Secondary | ICD-10-CM | POA: Diagnosis present

## 2016-03-07 DIAGNOSIS — Z86711 Personal history of pulmonary embolism: Secondary | ICD-10-CM

## 2016-03-07 DIAGNOSIS — Z87891 Personal history of nicotine dependence: Secondary | ICD-10-CM

## 2016-03-07 DIAGNOSIS — I2489 Other forms of acute ischemic heart disease: Secondary | ICD-10-CM

## 2016-03-07 DIAGNOSIS — E876 Hypokalemia: Secondary | ICD-10-CM | POA: Diagnosis present

## 2016-03-07 DIAGNOSIS — I481 Persistent atrial fibrillation: Secondary | ICD-10-CM | POA: Diagnosis present

## 2016-03-07 DIAGNOSIS — G2581 Restless legs syndrome: Secondary | ICD-10-CM | POA: Diagnosis present

## 2016-03-07 DIAGNOSIS — E669 Obesity, unspecified: Secondary | ICD-10-CM | POA: Diagnosis present

## 2016-03-07 DIAGNOSIS — M545 Low back pain: Secondary | ICD-10-CM | POA: Diagnosis present

## 2016-03-07 DIAGNOSIS — I493 Ventricular premature depolarization: Secondary | ICD-10-CM | POA: Diagnosis present

## 2016-03-07 HISTORY — DX: Alcohol abuse, in remission: F10.11

## 2016-03-07 HISTORY — DX: Supraventricular tachycardia, unspecified: I47.10

## 2016-03-07 HISTORY — DX: Personal history of pulmonary embolism: Z86.711

## 2016-03-07 HISTORY — DX: Supraventricular tachycardia: I47.1

## 2016-03-07 HISTORY — DX: Morbid (severe) obesity due to excess calories: E66.01

## 2016-03-07 LAB — COMPREHENSIVE METABOLIC PANEL
ALT: 22 U/L (ref 17–63)
AST: 37 U/L (ref 15–41)
Albumin: 4.5 g/dL (ref 3.5–5.0)
Alkaline Phosphatase: 61 U/L (ref 38–126)
Anion gap: 18 — ABNORMAL HIGH (ref 5–15)
BUN: 18 mg/dL (ref 6–20)
CO2: 22 mmol/L (ref 22–32)
Calcium: 9.8 mg/dL (ref 8.9–10.3)
Chloride: 102 mmol/L (ref 101–111)
Creatinine, Ser: 1.26 mg/dL — ABNORMAL HIGH (ref 0.61–1.24)
GFR calc Af Amer: >60 mL/min (ref >60)
GFR calc non Af Amer: 57 mL/min — ABNORMAL LOW (ref >60)
Glucose, Bld: 170 mg/dL — ABNORMAL HIGH (ref 65–99)
Potassium: 3.5 mmol/L (ref 3.5–5.1)
Sodium: 142 mmol/L (ref 135–145)
Total Bilirubin: 1.1 mg/dL (ref 0.3–1.2)
Total Protein: 6.8 g/dL (ref 6.5–8.1)

## 2016-03-07 LAB — CBC WITH DIFFERENTIAL/PLATELET
Basophils Absolute: 0 10*3/uL (ref 0.0–0.1)
Basophils Relative: 0 %
Eosinophils Absolute: 0.2 10*3/uL (ref 0.0–0.7)
Eosinophils Relative: 2 %
HCT: 44.7 % (ref 39.0–52.0)
Hemoglobin: 15.6 g/dL (ref 13.0–17.0)
Lymphocytes Relative: 18 %
Lymphs Abs: 2.8 10*3/uL (ref 0.7–4.0)
MCH: 32 pg (ref 26.0–34.0)
MCHC: 34.9 g/dL (ref 30.0–36.0)
MCV: 91.6 fL (ref 78.0–100.0)
Monocytes Absolute: 1.6 10*3/uL — ABNORMAL HIGH (ref 0.1–1.0)
Monocytes Relative: 10 %
Neutro Abs: 10.6 10*3/uL — ABNORMAL HIGH (ref 1.7–7.7)
Neutrophils Relative %: 70 %
Platelets: 217 10*3/uL (ref 150–400)
RBC: 4.88 MIL/uL (ref 4.22–5.81)
RDW: 13.7 % (ref 11.5–15.5)
WBC: 15.2 10*3/uL — ABNORMAL HIGH (ref 4.0–10.5)

## 2016-03-07 LAB — PROTIME-INR
INR: 1.87
Prothrombin Time: 21.8 seconds — ABNORMAL HIGH (ref 11.4–15.2)

## 2016-03-07 LAB — TROPONIN I
Troponin I: 0.41 ng/mL (ref <0.03)
Troponin I: 0.35 ng/mL (ref <0.03)

## 2016-03-07 LAB — I-STAT TROPONIN, ED
Troponin i, poc: 0.11 ng/mL (ref 0.00–0.08)
Troponin i, poc: 0.24 ng/mL (ref 0.00–0.08)

## 2016-03-07 MED ORDER — AMLODIPINE BESYLATE 10 MG PO TABS
10.0000 mg | ORAL_TABLET | Freq: Every day | ORAL | Status: DC
  Administered 2016-03-08 – 2016-03-09 (×2): 10 mg via ORAL
  Filled 2016-03-07 (×2): qty 1

## 2016-03-07 MED ORDER — ACETAMINOPHEN 325 MG PO TABS: 650.0000 mg | ORAL_TABLET | ORAL | Status: DC | PRN

## 2016-03-07 MED ORDER — GABAPENTIN 400 MG PO CAPS
800.0000 mg | ORAL_CAPSULE | Freq: Every day | ORAL | Status: DC
  Administered 2016-03-07 – 2016-03-08 (×2): 800 mg via ORAL
  Filled 2016-03-07 (×2): qty 2

## 2016-03-07 MED ORDER — GABAPENTIN 800 MG PO TABS
800.0000 mg | ORAL_TABLET | Freq: Every day | ORAL | Status: DC
  Filled 2016-03-07: qty 1

## 2016-03-07 MED ORDER — WARFARIN SODIUM 5 MG PO TABS
5.0000 mg | ORAL_TABLET | Freq: Once | ORAL | Status: AC
  Administered 2016-03-07: 5 mg via ORAL
  Filled 2016-03-07: qty 1

## 2016-03-07 MED ORDER — LOSARTAN POTASSIUM 50 MG PO TABS
100.0000 mg | ORAL_TABLET | Freq: Every day | ORAL | Status: DC
  Administered 2016-03-08 – 2016-03-09 (×2): 100 mg via ORAL
  Filled 2016-03-07 (×2): qty 2

## 2016-03-07 MED ORDER — LORAZEPAM 1 MG PO TABS
1.0000 mg | ORAL_TABLET | Freq: Four times a day (QID) | ORAL | Status: DC | PRN
  Administered 2016-03-07 – 2016-03-09 (×5): 1 mg via ORAL
  Filled 2016-03-07 (×5): qty 1

## 2016-03-07 MED ORDER — GABAPENTIN 400 MG PO CAPS: 800.0000 mg | ORAL_CAPSULE | Freq: Every evening | ORAL | Status: DC | PRN

## 2016-03-07 MED ORDER — ALUM & MAG HYDROXIDE-SIMETH 200-200-20 MG/5ML PO SUSP
30.0000 mL | Freq: Once | ORAL | Status: AC
  Administered 2016-03-07: 30 mL via ORAL
  Filled 2016-03-07: qty 30

## 2016-03-07 MED ORDER — ADENOSINE 6 MG/2ML IV SOLN
INTRAVENOUS | Status: AC
  Filled 2016-03-07: qty 6

## 2016-03-07 MED ORDER — CARVEDILOL 6.25 MG PO TABS
6.2500 mg | ORAL_TABLET | Freq: Two times a day (BID) | ORAL | Status: DC
  Administered 2016-03-07 – 2016-03-08 (×2): 6.25 mg via ORAL
  Filled 2016-03-07 (×2): qty 1

## 2016-03-07 MED ORDER — ONDANSETRON HCL 4 MG/2ML IJ SOLN: 4.0000 mg | Freq: Four times a day (QID) | INTRAMUSCULAR | Status: DC | PRN

## 2016-03-07 MED ORDER — ADENOSINE 6 MG/2ML IV SOLN
12.0000 mg | Freq: Once | INTRAVENOUS | Status: AC
  Administered 2016-03-07: 12 mg via INTRAVENOUS

## 2016-03-07 MED ORDER — TAMSULOSIN HCL 0.4 MG PO CAPS
0.4000 mg | ORAL_CAPSULE | Freq: Two times a day (BID) | ORAL | Status: DC
  Administered 2016-03-07 – 2016-03-09 (×4): 0.4 mg via ORAL
  Filled 2016-03-07 (×4): qty 1

## 2016-03-07 MED ORDER — ESCITALOPRAM OXALATE 10 MG PO TABS
20.0000 mg | ORAL_TABLET | Freq: Every day | ORAL | Status: DC
  Administered 2016-03-08 – 2016-03-09 (×2): 20 mg via ORAL
  Filled 2016-03-07 (×2): qty 2

## 2016-03-07 MED ORDER — DILTIAZEM HCL 100 MG IV SOLR
5.0000 mg/h | INTRAVENOUS | Status: DC
  Filled 2016-03-07: qty 100

## 2016-03-07 MED ORDER — GABAPENTIN 800 MG PO TABS
800.0000 mg | ORAL_TABLET | Freq: Every evening | ORAL | Status: DC | PRN
  Filled 2016-03-07: qty 1

## 2016-03-07 MED ORDER — DILTIAZEM LOAD VIA INFUSION
10.0000 mg | Freq: Once | INTRAVENOUS | Status: DC
  Filled 2016-03-07: qty 10

## 2016-03-07 MED ORDER — WARFARIN - PHARMACIST DOSING INPATIENT: Freq: Every day | Status: DC

## 2016-03-07 MED ORDER — CYCLOBENZAPRINE HCL 10 MG PO TABS: 5.0000 mg | ORAL_TABLET | Freq: Every day | ORAL | Status: DC | PRN

## 2016-03-07 NOTE — ED Notes (Signed)
ED Provider at bedside. 

## 2016-03-07 NOTE — ED Provider Notes (Addendum)
[facility] DEPT Provider Note   CSN: [id number] Arrival date & time: 03/07/16  8182     History   Chief Complaint Chief Complaint  Patient presents with  . Atrial Fibrillation    HPI Brandon [name] Bonito. is a 66 y.o. male.  HPI  The patient is a 66 year old male, he has a known history of atrial fibrillation and has required an ablation, he has been in normal sinus rhythm over the last year and has been on Coumadin. The patient also has a history of hypertension, hyperlipidemia, alcohol abuse over the last several years since he was separated from his wife who he states was an alcoholic. He reports that he was placed on albuterol up until a month ago but felt that the albuterol was making him jittery so he stopped using it. He has not had this over 30 days. The patient denies any obstructive coronary disease.  I saw this patient as well back in October 2017 during which time the patient had been admitted for arrhythmias and possible SVT, had hypokalemia at that time. The patient was in his other wise usual state of health until early hours of the morning when he started to feel these palpitations which have been persistent and severe. He gets extremely lightheaded and sweaty with it.  Ablation was 7/17 - Dr. Rayann Heman.    Past Medical History:  Diagnosis Date  . Anxiety   . Arthritis   . Benign neoplasm of colon   . Benign prostatic hyperplasia with urinary obstruction   . Bilateral pulmonary embolism ([place]) 3/14   admitted to [facility],  treated with Xarelto  . Cataract   . Chronic pain   . History of pulmonary embolism 03/19/2012  . Hyperlipemia   . Hypertension   . Insomnia   . Major depressive disorder, recurrent episode ([place])   . Mitral regurgitation 04/24/2013   Mild by TEE  . Obesity   . OSA (obstructive sleep apnea)    noncompliant with CPAP.  07/27/13- awaiting a CPAP,  . Osteoporosis   . Persistent atrial fibrillation ([place])   . Restless leg syndrome    takes gabapentin   . Sciatica   . Thrombus of left atrial appendage 03/09/2013  . Ventral hernia, unspecified, without mention of obstruction or gangrene    right abdominal wall    Patient Active Problem List   Diagnosis Date Noted  . SVT (supraventricular tachycardia) ([place]) 03/07/2016  . Obesity (BMI 30-39.9) 03/07/2016  . Alcohol abuse 03/07/2016  . Demand ischemia of myocardium ([place]) 03/07/2016  . Paroxysmal atrial fibrillation (H[facility])   . Restless leg syndrome 02/08/2014  . Current use of long term anticoagulation   . Obstructive sleep apnea 03/09/2013  . Hyperlipidemia 03/21/2012  . History of pulmonary embolism 03/19/2012  . Asthma, exogenous 06/20/2008  . Hypertensive heart disease     Past Surgical History:  Procedure Laterality Date  . CARDIOVERSION N/A 03/21/2012   Procedure: CARDIOVERSION;  Surgeon: Birdie Riddle, MD;  Location: [facility] ENDOSCOPY;  Service: Cardiovascular;  Laterality: N/A;  . CARDIOVERSION N/A 04/24/2013   Procedure: CARDIOVERSION;  Surgeon: Dorothy Spark, MD;  Location: [facility] ENDOSCOPY;  Service: Cardiovascular;  Laterality: N/A;  . CARDIOVERSION N/A 06/27/2015   Procedure: CARDIOVERSION;  Surgeon: Larey Dresser, MD;  Location: [place];  Service: Cardiovascular;  Laterality: N/A;  . CATARACT EXTRACTION    . COLONOSCOPY W/ POLYPECTOMY    . ELECTROPHYSIOLOGIC STUDY N/A 07/30/2015   Procedure: Atrial Fibrillation Ablation;  Surgeon: Jeneen Rinks  Allred, MD;  Location: [place] CV LAB;  Service: Cardiovascular;  Laterality: N/A;  . EYE SURGERY Right    cataract  . FEMUR FRACTURE SURGERY    . HERNIA REPAIR  07/06/2011  . INGUINAL HERNIA REPAIR Right 07/28/2013   Procedure: RIGHT INGUINAL HERNIA REPAIR;  Surgeon: Imogene Burn. Georgette Dover, MD;  Location: [place];  Service: General;  Laterality: Right;  . INSERTION OF MESH Right 07/28/2013   Procedure: INSERTION OF MESH;  Surgeon: Imogene Burn. Georgette Dover, MD;  Location: [place];  Service: General;  Laterality: Right;  . KNEE ARTHROSCOPY     left  .  REPLACEMENT TOTAL KNEE Left   . ROTATOR CUFF REPAIR     right  . ROTATOR CUFF REPAIR Left 03/08/2014   DR SUPPLE  . SHOULDER ARTHROSCOPY WITH ROTATOR CUFF REPAIR AND SUBACROMIAL DECOMPRESSION Left 03/08/2014   Procedure: LEFT SHOULDER ARTHROSCOPY WITH ROTATOR CUFF REPAIR/SUBACROMIAL DECOMPRESSION/DISTAL CLAVICLE RESECTION;  Surgeon: Marin Shutter, MD;  Location: [place];  Service: Orthopedics;  Laterality: Left;  . TEE WITHOUT CARDIOVERSION N/A 03/21/2012   Procedure: TRANSESOPHAGEAL ECHOCARDIOGRAM (TEE);  Surgeon: Birdie Riddle, MD;  Location: [place];  Service: Cardiovascular;  Laterality: N/A;  . TEE WITHOUT CARDIOVERSION N/A 04/24/2013   Procedure: TRANSESOPHAGEAL ECHOCARDIOGRAM (TEE);  Surgeon: Dorothy Spark, MD;  Location: [place];  Service: Cardiovascular;  Laterality: N/A;  . TEE WITHOUT CARDIOVERSION N/A 07/29/2015   Procedure: TRANSESOPHAGEAL ECHOCARDIOGRAM (TEE);  Surgeon: Fay Records, MD;  Location: [facility] ENDOSCOPY;  Service: Cardiovascular;  Laterality: N/A;  . TONSILLECTOMY         Home Medications    Prior to Admission medications   Medication Sig Start Date End Date Taking? Authorizing Provider  amLODipine (NORVASC) 10 MG tablet Take 10 mg by mouth daily.   Yes Historical Provider, MD  carvedilol (COREG) 6.25 MG tablet Take 1 tablet (6.25 mg total) by mouth 2 (two) times daily. 07/03/15  Yes Thompson Grayer, MD  cyclobenzaprine (FLEXERIL) 5 MG tablet Take 1 tablet by mouth daily as needed for muscle spasms.  10/11/15  Yes Historical Provider, MD  escitalopram (LEXAPRO) 20 MG tablet Take 20 mg by mouth daily.   Yes Historical Provider, MD  gabapentin (NEURONTIN) 800 MG tablet Take 800 mg by mouth See admin instructions. TAKE 800 MG BY MOUTH DAILY WITH SUPPER AND 800 MG AT BEDTIME AS NEEDED FOR NERVE PAIN   Yes Historical Provider, MD  losartan (COZAAR) 100 MG tablet Take 100 mg by mouth daily.   Yes Historical Provider, MD  tamsulosin (FLOMAX) 0.4 MG CAPS capsule Take  0.4 mg by mouth 2 (two) times daily.    Yes Historical Provider, MD  warfarin (COUMADIN) 5 MG tablet TAKE 1 TABLET AS DIRECTED BY COUMADIN CLINIC Patient taking differently: Take 2.5 mg by mouth daily on Monday, Wednesday and Friday. Take 5 mg by mouth daily on all other days 01/07/16  Yes Thompson Grayer, MD  pantoprazole (PROTONIX) 40 MG tablet Take 1 tablet (40 mg total) by mouth daily. Patient not taking: Reported on 03/07/2016 07/31/15   Patsey Berthold, NP    Family History Family History  Problem Relation Age of Onset  . Cancer Father     bone  . Cancer Paternal Grandmother     ovarian  . CVA Other     Fam Hx of multiple myeloma  . Diabetes Other     Fam Hx of DM    Social History Social History  Substance Use Topics  . Smoking  status: Former Research scientist (life sciences)  . Smokeless tobacco: Never Used  . Alcohol use 0.0 oz/week     Comment: 2 drinks per night     Allergies   Ace inhibitors and Albuterol   Review of Systems Review of Systems  All other systems reviewed and are negative.    Physical Exam Updated Vital Signs BP 132/98   Pulse 81   Resp 12   SpO2 98%   Physical Exam  Constitutional: He appears well-developed and well-nourished. He appears distressed.  HENT:  Head: Normocephalic and atraumatic.  Mouth/Throat: Oropharynx is clear and moist. No oropharyngeal exudate.  Eyes: Conjunctivae and EOM are normal. Pupils are equal, round, and reactive to light. Right eye exhibits no discharge. Left eye exhibits no discharge. No scleral icterus.  Neck: Normal range of motion. Neck supple. No JVD present. No thyromegaly present.  Cardiovascular: Normal heart sounds.  Exam reveals no gallop and no friction rub.   No murmur heard. Rapid rate, weak pulses, SVT   Pulmonary/Chest: Effort normal and breath sounds normal. No respiratory distress. He has no wheezes. He has no rales.  Abdominal: Soft. Bowel sounds are normal. He exhibits no distension and no mass. There is no tenderness.    Musculoskeletal: Normal range of motion. He exhibits no edema or tenderness.  Lymphadenopathy:    He has no cervical adenopathy.  Neurological: He is alert. Coordination normal.  Skin: Skin is warm and dry. No rash noted. No erythema.  Psychiatric: He has a normal mood and affect. His behavior is normal.  Nursing note and vitals reviewed.    ED Treatments / Results  Labs (all labs ordered are listed, but only abnormal results are displayed) Labs Reviewed  PROTIME-INR - Abnormal; Notable for the following:       Result Value   Prothrombin Time 21.8 (*)    All other components within normal limits  CBC WITH DIFFERENTIAL/PLATELET - Abnormal; Notable for the following:    WBC 15.2 (*)    Neutro Abs 10.6 (*)    Monocytes Absolute 1.6 (*)    All other components within normal limits  COMPREHENSIVE METABOLIC PANEL - Abnormal; Notable for the following:    Glucose, Bld 170 (*)    Creatinine, Ser 1.26 (*)    GFR calc non Af Amer 57 (*)    Anion gap 18 (*)    All other components within normal limits  I-STAT TROPOININ, ED - Abnormal; Notable for the following:    Troponin i, poc 0.11 (*)    All other components within normal limits  I-STAT TROPOININ, ED - Abnormal; Notable for the following:    Troponin i, poc 0.24 (*)    All other components within normal limits    EKG  EKG Interpretation  Date/Time:  Saturday March 07 2016 09:43:47 EST Ventricular Rate:  177 PR Interval:    QRS Duration: 98 QT Interval:  285 QTC Calculation: 490 R Axis:   -17 Text Interpretation:  Supraventricular tachycardia Long R-R with ventricular escape Borderline left axis deviation Repolarization abnormality, prob rate related Confirmed by Ashanna Heinsohn  MD, Noemy Hallmon ([id number]) on 03/07/2016 10:29:02 AM       EKG Interpretation  Date/Time:  Saturday March 07 2016 09:58:58 EST Ventricular Rate:  105 PR Interval:    QRS Duration: 95 QT Interval:  344 QTC Calculation: 455 R Axis:   -6 Text Interpretation:   Sinus tachycardia Paired ventricular premature complexes Borderline prolonged PR interval Since last tracing PVC's prsent and NSR now prsen  Confirmed by Sabra Heck  MD, Fraser ([id number]) on 03/07/2016 10:29:51 AM        Radiology No results found.  Procedures Procedures (including critical care time)  Medications Ordered in ED Medications  adenosine (ADENOCARD) 6 MG/2ML injection (not administered)  adenosine (ADENOCARD) 6 MG/2ML injection 12 mg (12 mg Intravenous Given 03/07/16 0957)     Initial Impression / Assessment and Plan / ED Course  I have reviewed the triage vital signs and the nursing notes.  Pertinent labs & imaging results that were available during my care of the patient were reviewed by me and considered in my medical decision making (see chart for details).     The patient presented with SVT, this was converted with 12 mg of adenosine, this appeared successful. The patient has been in a rapid heartbeat for several hours this morning which I suspect was causing his hypotension as this seems to have improved as well. Troponin did come back at 0.11. I will discuss this with cardiology. The patient is already on Coumadin at level being checked at this time. Otherwise labs show leukocytosis.  Discussed the case with Dr. Wynonia Lawman of cardiology who will see the patient in consultation. Rechecked the patient at 10:50 AM, he is currently in normal sinus rhythm, occasional PVC, still has a slight abnormal feeling in his chest but feeling much much better and his pressure is currently 118/80.  The patient's troponin has continued to elevate, his rhythm has maintained a sinus rhythm after adenosine was given. I discussed the patient's care with cardiology, they will admit the patient to the hospital.  CRITICAL CARE Performed by: Johnna Acosta Total critical care time: 35 minutes Critical care time was exclusive of separately billable procedures and treating other patients. Critical care  was necessary to treat or prevent imminent or life-threatening deterioration. Critical care was time spent personally by me on the following activities: development of treatment plan with patient and/or surrogate as well as nursing, discussions with consultants, evaluation of patient's response to treatment, examination of patient, obtaining history from patient or surrogate, ordering and performing treatments and interventions, ordering and review of laboratory studies, ordering and review of radiographic studies, pulse oximetry and re-evaluation of patient's condition.   Final Clinical Impressions(s) / ED Diagnoses   Final diagnoses:  SVT (supraventricular tachycardia) ([place])  NSTEMI (non-ST elevated myocardial infarction) [facility])    New Prescriptions New Prescriptions   No medications on file     Noemi Chapel, MD 03/07/16 [place], MD 04/05/16 [phone]

## 2016-03-07 NOTE — H&P (Signed)
History and Physical   Admit date: 03/07/2016 Name:  Brandon [name]. Medical record number: [id number] DOB/Age:  1950-09-26  66 y.o. male  Referring Physician:  [facility]  Primary Cardiologist:  Allred  Primary Physician:   [facility] regional physicians  Chief complaint/reason for admission: Rapid heart rate and shortness of breath  HPI:  This 66 year old male has a history of paroxysmal atrial fibrillation which became persistent.  He failed amiodarone and several cardioversions and eventually underwent catheter ablation by Dr. Rayann Heman in July 2017.  He had a left atrial thrombus noted previously and also has a history of bilateral pulmonary emboli in 2014 and has been recommended for lifelong anticoagulation.  He has been drinking quite heavily drinking 6-9 shots of alcohol per night due to depression and anxiety over his separation from his wife.  He says he drinks about a gallon per week.  He had been doing relatively well since his atrial fib ablation but began to have episodes of tachycardia.  Once precipitated by an albuterol inhaler and he has noted occasional episodes where his heart would race.  He awoke at 3 AM with shortness of breath and racing heart rate and presented to the emergency room about 9 AM with SVT at a rate of 177.  He was given adenosine with conversion.  He had some mild chest pain with that and troponin subsequently returned abnormal and it started to climb.  Postconversion EKG did not show ischemic changes and he is currently pain free.  He normally denies PND or orthopnea or edema.  He states that he has lost some weight recently although scales indicate he has gained weight.  He has a history of sleep apnea and also has known hypertensive heart disease.   Past Medical History:  Diagnosis Date  . Anxiety   . Arthritis   . Benign neoplasm of colon   . Benign prostatic hyperplasia with urinary obstruction   . Bilateral pulmonary embolism ([place]) 3/14   admitted  to [facility],  treated with Xarelto  . Cataract   . Chronic pain   . Hyperlipemia   . Hypertension   . Insomnia   . Major depressive disorder, recurrent episode ([place])   . Mitral regurgitation 04/24/2013   Mild by TEE  . Obesity   . OSA (obstructive sleep apnea)    noncompliant with CPAP.  07/27/13- awaiting a CPAP,  . Osteoporosis   . Persistent atrial fibrillation ([place])   . Restless leg syndrome    takes gabapentin  . Sciatica   . Thrombus of left atrial appendage 03/09/2013  . Ventral hernia, unspecified, without mention of obstruction or gangrene    right abdominal wall     Past Surgical History:  Procedure Laterality Date  . CARDIOVERSION N/A 03/21/2012   Procedure: CARDIOVERSION;  Surgeon: Birdie Riddle, MD;  Location: [facility] ENDOSCOPY;  Service: Cardiovascular;  Laterality: N/A;  . CARDIOVERSION N/A 04/24/2013   Procedure: CARDIOVERSION;  Surgeon: Dorothy Spark, MD;  Location: [facility] ENDOSCOPY;  Service: Cardiovascular;  Laterality: N/A;  . CARDIOVERSION N/A 06/27/2015   Procedure: CARDIOVERSION;  Surgeon: Larey Dresser, MD;  Location: [place];  Service: Cardiovascular;  Laterality: N/A;  . CATARACT EXTRACTION    . COLONOSCOPY W/ POLYPECTOMY    . ELECTROPHYSIOLOGIC STUDY N/A 07/30/2015   Procedure: Atrial Fibrillation Ablation;  Surgeon: Thompson Grayer, MD;  Location: [place] CV LAB;  Service: Cardiovascular;  Laterality: N/A;  . EYE SURGERY Right    cataract  .  FEMUR FRACTURE SURGERY    . HERNIA REPAIR  07/06/2011  . INGUINAL HERNIA REPAIR Right 07/28/2013   Procedure: RIGHT INGUINAL HERNIA REPAIR;  Surgeon: Imogene Burn. Georgette Dover, MD;  Location: [place];  Service: General;  Laterality: Right;  . INSERTION OF MESH Right 07/28/2013   Procedure: INSERTION OF MESH;  Surgeon: Imogene Burn. Georgette Dover, MD;  Location: [place];  Service: General;  Laterality: Right;  . KNEE ARTHROSCOPY     left  . REPLACEMENT TOTAL KNEE Left   . ROTATOR CUFF REPAIR     right  . ROTATOR CUFF REPAIR Left  03/08/2014   DR SUPPLE  . SHOULDER ARTHROSCOPY WITH ROTATOR CUFF REPAIR AND SUBACROMIAL DECOMPRESSION Left 03/08/2014   Procedure: LEFT SHOULDER ARTHROSCOPY WITH ROTATOR CUFF REPAIR/SUBACROMIAL DECOMPRESSION/DISTAL CLAVICLE RESECTION;  Surgeon: Marin Shutter, MD;  Location: [place];  Service: Orthopedics;  Laterality: Left;  . TEE WITHOUT CARDIOVERSION N/A 03/21/2012   Procedure: TRANSESOPHAGEAL ECHOCARDIOGRAM (TEE);  Surgeon: Birdie Riddle, MD;  Location: [place];  Service: Cardiovascular;  Laterality: N/A;  . TEE WITHOUT CARDIOVERSION N/A 04/24/2013   Procedure: TRANSESOPHAGEAL ECHOCARDIOGRAM (TEE);  Surgeon: Dorothy Spark, MD;  Location: [place];  Service: Cardiovascular;  Laterality: N/A;  . TEE WITHOUT CARDIOVERSION N/A 07/29/2015   Procedure: TRANSESOPHAGEAL ECHOCARDIOGRAM (TEE);  Surgeon: Fay Records, MD;  Location: [facility] ENDOSCOPY;  Service: Cardiovascular;  Laterality: N/A;  . TONSILLECTOMY    .  Allergies: is allergic to ace inhibitors and albuterol.   Medications: Prior to Admission medications   Medication Sig Start Date End Date Taking? Authorizing Provider  amLODipine (NORVASC) 10 MG tablet Take 10 mg by mouth daily.   Yes Historical Provider, MD  carvedilol (COREG) 6.25 MG tablet Take 1 tablet (6.25 mg total) by mouth 2 (two) times daily. 07/03/15  Yes Thompson Grayer, MD  cyclobenzaprine (FLEXERIL) 5 MG tablet Take 1 tablet by mouth daily as needed for muscle spasms.  10/11/15  Yes Historical Provider, MD  escitalopram (LEXAPRO) 20 MG tablet Take 20 mg by mouth daily.   Yes Historical Provider, MD  gabapentin (NEURONTIN) 800 MG tablet Take 800 mg by mouth See admin instructions. TAKE 800 MG BY MOUTH DAILY WITH SUPPER AND 800 MG AT BEDTIME AS NEEDED FOR NERVE PAIN   Yes Historical Provider, MD  losartan (COZAAR) 100 MG tablet Take 100 mg by mouth daily.   Yes Historical Provider, MD  tamsulosin (FLOMAX) 0.4 MG CAPS capsule Take 0.4 mg by mouth 2 (two) times daily.    Yes  Historical Provider, MD  warfarin (COUMADIN) 5 MG tablet TAKE 1 TABLET AS DIRECTED BY COUMADIN CLINIC Patient taking differently: Take 2.5 mg by mouth daily on Monday, Wednesday and Friday. Take 5 mg by mouth daily on all other days 01/07/16  Yes Thompson Grayer, MD  pantoprazole (PROTONIX) 40 MG tablet Take 1 tablet (40 mg total) by mouth daily. Patient not taking: Reported on 03/07/2016 07/31/15   Patsey Berthold, NP    Family History:  Family Status  Relation Status  . Father Deceased  . Mother Deceased  . Paternal Grandmother   . Other     Social History:   reports that he has quit smoking. He has never used smokeless tobacco. He reports that he drinks alcohol. He reports that he does not use drugs.   Social History   Social History Narrative   Lives with wife in [place]   Retired Dietitian  He is currently living alone.  He was separated from his wife and has been using 6-9 shots of alcohol or proximally 1 gallon per week   Review of Systems:  Has had urinary frequency as well as nocturia.  He has chronic low back pain.  He has significant anxiety and depression.  Occasional dyspepsia.  Worsening dyspnea recently. Other than as noted above, the remainder of the review of systems is normal  Physical Exam: BP 121/69   Pulse 73   Resp 12   SpO2 99%  General appearance: Obese talkative male in no acute distress Head: Normocephalic, without obvious abnormality, atraumatic Eyes: conjunctivae/corneas clear. PERRL, EOM's intact. Fundi benign. Neck: no adenopathy, no carotid bruit, no JVD and supple, symmetrical, trachea midline Lungs: clear to auscultation bilaterally Heart: regular rate and rhythm, S1, S2 normal, no murmur, click, rub or gallop Abdomen: soft, non-tender; bowel sounds normal; no masses,  no organomegaly and Right inguinal hernia present Rectal: deferred Extremities: extremities normal, atraumatic, no cyanosis or edema Pulses: 2+ and  symmetric Skin: Skin color, texture, turgor normal. No rashes or lesions Neurologic: Grossly normal Psych: Alert and oriented 3  Labs: CBC  Recent Labs  03/07/16 0950  WBC 15.2*  RBC 4.88  HGB 15.6  HCT 44.7  PLT 217  MCV 91.6  MCH 32.0  MCHC 34.9  RDW 13.7  LYMPHSABS 2.8  MONOABS 1.6*  EOSABS 0.2  BASOSABS 0.0   CMP   Recent Labs  03/07/16 0950  NA 142  K 3.5  CL 102  CO2 22  GLUCOSE 170*  BUN 18  CREATININE 1.26*  CALCIUM 9.8  PROT 6.8  ALBUMIN 4.5  AST 37  ALT 22  ALKPHOS 61  BILITOT 1.1  GFRNONAA 57*  GFRAA >60    BNP (last 3 results)  Recent Labs  10/23/15 1107  BNP 91.6    Cardiac Panel (last 3 results) Troponin (Point of Care Test)  Recent Labs  03/07/16 1356  TROPIPOC 0.24*    EKG: Supraventricular tachycardia at a rate of 177, follow-up EKG shows sinus rhythm with a rate that is normal with occasional PVCs, borderline left axis. Independently reviewed by me  Radiology: Mild elevation of hemidiaphragms   IMPRESSIONS: 1.  Prolonged supraventricular tachycardia converted with adenosine 2.  Paroxysmal atrial fibrillation recent ablation 3.  Long-term use of anticoagulation 4.  Significant alcohol abuse 5.  Hypertensive heart disease with LVH by echo 6.  History of bilateral pulmonary emboli 2014 7.  Obesity 8.  Hyperlipidemia 9.  Elevation of troponin and is likely demand ischemia due to rapid SVT.  PLAN: He is in sinus rhythm now.  I will increase his carvedilol and will monitor his troponins to be sure he does not have a significant elevation.  Suspect this is demand ischemia.  Check hemoglobin A1c with elevated glucose, repeat renal panel in morning because of elevation of creatinine.  Signed: Kerry Hough MD [facility] Cardiology  03/07/2016, 2:39 PM

## 2016-03-07 NOTE — Progress Notes (Signed)
ANTICOAGULATION CONSULT NOTE - Initial Consult  Pharmacy Consult for warfarin   Indication: atrial fibrillation  Allergies  Allergen Reactions  . Ace Inhibitors Cough  . Albuterol Other (See Comments)    Racing heart    Patient Measurements: Height: 6\' 4"  (193 cm) Weight: 242 lb 11.2 oz (110.1 kg) IBW/kg (Calculated) : 86.8  Vital Signs: Temp: 98.2 F (36.8 C) (03/03 1550) Temp Source: Oral (03/03 1550) BP: 126/84 (03/03 1550) Pulse Rate: 82 (03/03 1550)  Labs:  Recent Labs  03/07/16 0950  HGB 15.6  HCT 44.7  PLT 217  LABPROT 21.8*  INR 1.87  CREATININE 1.26*    Estimated Creatinine Clearance: 77.3 mL/min (by C-G formula based on SCr of 1.26 mg/dL (H)).  Assessment: CC/HPI: 66 yo m presenting with afib  PMH: BPH, HLD, HTN, Hx of PE, afib  Anticoag: warfarin pta for afib. Admit INR 1.87  PTA warfarin 2.5 mg MWF and 5 mg AOD - last dose 3/2  Renal: SCr 1.26  Heme/Onc: H&H 15.6/44.7, Plt 217  Goal of Therapy:  Heparin level 0.3-0.7 units/ml Monitor platelets by anticoagulation protocol: Yes   Plan:  Warfarin 5 mg x 1 Daily INR  Levester Fresh, PharmD, BCPS, BCCCP Clinical Pharmacist Clinical phone for 03/07/2016 from 7a-3:30p: [phone] If after 3:30p, please call main pharmacy at: [phone] 03/07/2016 4:11 PM

## 2016-03-07 NOTE — Progress Notes (Signed)
Spoke with MD on call and was made aware of trop of .41 and pt's cp. New order received. Pt still resting in bed with 4/10 CP. MAALOX given per md order. Will update on coming nurse.

## 2016-03-07 NOTE — ED Triage Notes (Signed)
Pt in from home, hx of Afib. States palpitations, diaphoresis and chest pressure started at 0300. At triage, pt hypotensive (87/40), HR 176. Pt pale on arrival, emesis x 1.

## 2016-03-07 NOTE — Progress Notes (Signed)
Pt c/o of 4/10 CP after dinner. CP worse with exertion. EKG obtained. BP 139/94. Hr in the 80's. Trop .41. Cardiology MD on call paged. Pt currently resting in bed. Will cont to monitor pt.

## 2016-03-07 NOTE — ED Notes (Signed)
RN Vicente Males notified of I-stat troponin 0.24

## 2016-03-07 NOTE — ED Notes (Signed)
Dr. Wynonia Lawman rounding at bedside

## 2016-03-07 NOTE — ED Notes (Signed)
Elevated troponin result given to Dr. Sabra Heck.

## 2016-03-07 NOTE — Progress Notes (Signed)
Pt came up form the floor and oriented to his room and instructed on how to use the call bell.  Pt has no complaints of pain. Pt stated he is hungry and wants to go home. Pt educated on the importance of staying. Pt ordered tray and was given water and crackers. Pt agreed to staying but stated he wants to be left alone to rest. Will cont to monitor pt. Pt also stated drink 6 shots of vodka a night. Will cont to monitor pt.

## 2016-03-08 ENCOUNTER — Encounter (HOSPITAL_COMMUNITY): Payer: Self-pay | Admitting: General Practice

## 2016-03-08 ENCOUNTER — Observation Stay (HOSPITAL_BASED_OUTPATIENT_CLINIC_OR_DEPARTMENT_OTHER): Payer: Medicare Other

## 2016-03-08 DIAGNOSIS — I481 Persistent atrial fibrillation: Secondary | ICD-10-CM

## 2016-03-08 DIAGNOSIS — I248 Other forms of acute ischemic heart disease: Secondary | ICD-10-CM | POA: Diagnosis not present

## 2016-03-08 DIAGNOSIS — Z6829 Body mass index (BMI) 29.0-29.9, adult: Secondary | ICD-10-CM | POA: Diagnosis not present

## 2016-03-08 DIAGNOSIS — E876 Hypokalemia: Secondary | ICD-10-CM | POA: Diagnosis present

## 2016-03-08 DIAGNOSIS — G4733 Obstructive sleep apnea (adult) (pediatric): Secondary | ICD-10-CM | POA: Diagnosis present

## 2016-03-08 DIAGNOSIS — F419 Anxiety disorder, unspecified: Secondary | ICD-10-CM | POA: Diagnosis present

## 2016-03-08 DIAGNOSIS — I471 Supraventricular tachycardia: Principal | ICD-10-CM

## 2016-03-08 DIAGNOSIS — N400 Enlarged prostate without lower urinary tract symptoms: Secondary | ICD-10-CM | POA: Diagnosis present

## 2016-03-08 DIAGNOSIS — I493 Ventricular premature depolarization: Secondary | ICD-10-CM | POA: Diagnosis present

## 2016-03-08 DIAGNOSIS — E785 Hyperlipidemia, unspecified: Secondary | ICD-10-CM | POA: Diagnosis present

## 2016-03-08 DIAGNOSIS — Z96652 Presence of left artificial knee joint: Secondary | ICD-10-CM | POA: Diagnosis present

## 2016-03-08 DIAGNOSIS — I119 Hypertensive heart disease without heart failure: Secondary | ICD-10-CM | POA: Diagnosis not present

## 2016-03-08 DIAGNOSIS — Z7901 Long term (current) use of anticoagulants: Secondary | ICD-10-CM | POA: Diagnosis not present

## 2016-03-08 DIAGNOSIS — G2581 Restless legs syndrome: Secondary | ICD-10-CM | POA: Diagnosis present

## 2016-03-08 DIAGNOSIS — R072 Precordial pain: Secondary | ICD-10-CM

## 2016-03-08 DIAGNOSIS — G8929 Other chronic pain: Secondary | ICD-10-CM | POA: Diagnosis present

## 2016-03-08 DIAGNOSIS — F1010 Alcohol abuse, uncomplicated: Secondary | ICD-10-CM | POA: Diagnosis not present

## 2016-03-08 DIAGNOSIS — F339 Major depressive disorder, recurrent, unspecified: Secondary | ICD-10-CM | POA: Diagnosis present

## 2016-03-08 DIAGNOSIS — Z86711 Personal history of pulmonary embolism: Secondary | ICD-10-CM | POA: Diagnosis not present

## 2016-03-08 DIAGNOSIS — Z79899 Other long term (current) drug therapy: Secondary | ICD-10-CM | POA: Diagnosis not present

## 2016-03-08 DIAGNOSIS — E669 Obesity, unspecified: Secondary | ICD-10-CM | POA: Diagnosis not present

## 2016-03-08 DIAGNOSIS — M810 Age-related osteoporosis without current pathological fracture: Secondary | ICD-10-CM | POA: Diagnosis present

## 2016-03-08 DIAGNOSIS — M545 Low back pain: Secondary | ICD-10-CM | POA: Diagnosis present

## 2016-03-08 DIAGNOSIS — Z87891 Personal history of nicotine dependence: Secondary | ICD-10-CM | POA: Diagnosis not present

## 2016-03-08 LAB — BASIC METABOLIC PANEL
Anion gap: 8 (ref 5–15)
Anion gap: 6 (ref 5–15)
BUN: 8 mg/dL (ref 6–20)
BUN: 8 mg/dL (ref 6–20)
CO2: 32 mmol/L (ref 22–32)
CO2: 27 mmol/L (ref 22–32)
Calcium: 8.8 mg/dL — ABNORMAL LOW (ref 8.9–10.3)
Calcium: 8.9 mg/dL (ref 8.9–10.3)
Chloride: 101 mmol/L (ref 101–111)
Chloride: 107 mmol/L (ref 101–111)
Creatinine, Ser: 0.9 mg/dL (ref 0.61–1.24)
Creatinine, Ser: 0.79 mg/dL (ref 0.61–1.24)
GFR calc Af Amer: >60 mL/min (ref >60)
GFR calc Af Amer: >60 mL/min (ref >60)
GFR calc non Af Amer: >60 mL/min (ref >60)
GFR calc non Af Amer: >60 mL/min (ref >60)
Glucose, Bld: 108 mg/dL — ABNORMAL HIGH (ref 65–99)
Glucose, Bld: 96 mg/dL (ref 65–99)
Potassium: 2.7 mmol/L — CL (ref 3.5–5.1)
Potassium: 3.1 mmol/L — ABNORMAL LOW (ref 3.5–5.1)
Sodium: 141 mmol/L (ref 135–145)
Sodium: 140 mmol/L (ref 135–145)

## 2016-03-08 LAB — ECHOCARDIOGRAM COMPLETE
E/e' ratio: 3.51
FS: 21 % — AB (ref 28–44)
Height: 76 in
IVS/LV PW RATIO, ED: 0.85
LA ID, A-P, ES: 48 mm
LA diam end sys: 48 mm
LA diam index: 1.98 cm/m2
LA vol A4C: 65.7 ml
LA vol index: 33 mL/m2
LA vol: 79.8 mL
LV E/e' medial: 3.51
LV E/e'average: 3.51
LV PW d: 14.3 mm — AB (ref 0.6–1.1)
LV dias vol index: 39 mL/m2
LV dias vol: 93 mL (ref 62–150)
LV e' LATERAL: 8.84 cm/s
LV sys vol index: 20 mL/m2
LV sys vol: 49 mL (ref 21–61)
LVOT SV: 101 mL
LVOT VTI: 19.1 cm
LVOT area: 5.31 cm2
LVOT diameter: 26 mm
LVOT peak vel: 91.6 cm/s
Lateral S' vel: 12.6 cm/s
MV pk A vel: 64.5 m/s
MV pk E vel: 31 m/s
Simpson's disk: 48
Stroke v: 45 ml
TAPSE: 23.1 mm
TDI e' lateral: 8.84
TDI e' medial: 7.15
Weight: 3939.2 oz

## 2016-03-08 LAB — PROTIME-INR
INR: 1.58
Prothrombin Time: 19 seconds — ABNORMAL HIGH (ref 11.4–15.2)

## 2016-03-08 LAB — TROPONIN I: Troponin I: 0.24 ng/mL (ref <0.03)

## 2016-03-08 MED ORDER — WARFARIN SODIUM 5 MG PO TABS
5.0000 mg | ORAL_TABLET | Freq: Once | ORAL | Status: AC
  Administered 2016-03-08: 5 mg via ORAL
  Filled 2016-03-08: qty 1

## 2016-03-08 MED ORDER — CARVEDILOL 12.5 MG PO TABS
12.5000 mg | ORAL_TABLET | Freq: Two times a day (BID) | ORAL | Status: DC
  Administered 2016-03-08 – 2016-03-09 (×2): 12.5 mg via ORAL
  Filled 2016-03-08 (×2): qty 1

## 2016-03-08 MED ORDER — POTASSIUM CHLORIDE CRYS ER 20 MEQ PO TBCR
80.0000 meq | EXTENDED_RELEASE_TABLET | Freq: Once | ORAL | Status: AC
  Administered 2016-03-08: 80 meq via ORAL
  Filled 2016-03-08: qty 4

## 2016-03-08 NOTE — Progress Notes (Signed)
Dr. Otelia Sergeant on call @ [facility] made aware of K 2.7. Order received.

## 2016-03-08 NOTE — Progress Notes (Signed)
Subjective:  He says that he feels good today.  Denies chest pain or shortness of breath today but had an episode of chest pain on, and to the floor yesterday.  He was just eating and EKG was obtained that did not show any ischemic changes.  His troponins have gradually come down from his prolonged SVT that was around 6 hours.  He was severely hypokalemic this morning and was replaced.  No recurrence of SVT.  Some PVCs noted.  Objective:  Vital Signs in the last 24 hours: BP 124/81   Pulse 79   Temp 98.7 F (37.1 C) (Oral)   Resp 18   Ht 6\' 4"  (1.93 m)   Wt 111.7 kg (246 lb 3.2 oz)   SpO2 99%   BMI 29.97 kg/m   Physical Exam: Obese white male currently in no acute distress Lungs:  Clear Cardiac:  Regular rhythm, normal S1 and S2, no S3, occasional irregular beats noted Abdomen:  Soft, nontender, no masses Extremities:  No edema present  Intake/Output from previous day: 03/03 0701 - 03/04 0700 In: 355 [P.O.:355] Out: 2000 [Urine:2000]  Weight Filed Weights   03/07/16 1550 03/08/16 0526  Weight: 110.1 kg (242 lb 11.2 oz) 111.7 kg (246 lb 3.2 oz)    Lab Results: Basic Metabolic Panel:  Recent Labs  03/07/16 0950 03/08/16 0351  NA 142 141  K 3.5 2.7*  CL 102 101  CO2 22 32  GLUCOSE 170* 108*  BUN 18 8  CREATININE 1.26* 0.90   CBC:  Recent Labs  03/07/16 0950  WBC 15.2*  NEUTROABS 10.6*  HGB 15.6  HCT 44.7  MCV 91.6  PLT 217   Cardiac Enzymes: Troponin (Point of Care Test)  Recent Labs  03/07/16 1356  TROPIPOC 0.24*   Cardiac Panel (last 3 results)  Recent Labs  03/07/16 1618 03/07/16 2148 03/08/16 0351  TROPONINI 0.41* 0.35* 0.24*    Telemetry: Personally reviewed by me.  No recurrence of SVT.  Sinus rhythm with PVCs noted.  Assessment/Plan:  1.  Prolonged supraventricular tachycardia 2.  History of paroxysmal atrial fibrillation with previous ablation 3.  Abnormal troponin likely due to demand ischemia from prolonged SVT 4.   Hypokalemia in the absence of diuretics 5.  History of alcohol use 6.  Hypertensive heart disease  Recommendations:  Recheck potassium later this afternoon.  I've asked for EP to evaluate because of the recurrence of SVT following ablation previously.  Increase carvedilol.  Keep nothing by mouth after midnight.  Possible myocardial perfusion scan if he doesn't have any more chest discomfort in light of the abnormal troponins.     Kerry Hough  MD [facility] Cardiology  03/08/2016, 10:22 AM

## 2016-03-08 NOTE — Care Management Note (Signed)
Case Management Note  Patient Details  Name: Brandon [name]. MRN: [id number] Date of Birth: 03/12/1950  Subjective/Objective:  66 y.o. M admitted with  Afib and subtherapeutic INR. Currently has PCP and attends [facility] on [address]. No CM needs at present.                 Action/Plan:CM will sign off for now but will be available should additional discharge needs arise or disposition change.    Expected Discharge Date:                  Expected Discharge Plan:  Home/Self Care  In-House Referral:  NA  Discharge planning Services  CM Consult  Post Acute Care Choice:  NA Choice offered to:  Patient  DME Arranged:  N/A DME Agency:  NA  HH Arranged:  NA HH Agency:  NA  Status of Service:  Completed, signed off  If discussed at [place] of Stay Meetings, dates discussed:    Additional Comments:  Delrae Sawyers, RN 03/08/2016, 9:36 AM

## 2016-03-08 NOTE — Care Management Obs Status (Signed)
[facility] NOTIFICATION   Patient Details  Name: Brandon [name]. MRN: [id number] Date of Birth: Dec 11, 1950   Medicare Observation Status Notification Given:  Yes    CrutchfieldAntony Haste, RN 03/08/2016, 9:35 AM

## 2016-03-08 NOTE — Progress Notes (Signed)
  Echocardiogram 2D Echocardiogram has been performed.  Brandon [name] 03/08/2016, 3:03 PM

## 2016-03-08 NOTE — Consult Note (Signed)
CARDIOLOGY CONSULT NOTE     Primary Care Physician: Nilda Simmer, MD Referring Physician: Tobey Grim Date: 03/07/2016  Reason for consultation: SVT  Elizer B Cashwell [name] is a 66 y.o. male with a h/o history of paroxysmal atrial fibrillation which became persistent.  He failed amiodarone and several cardioversions and eventually underwent catheter ablation by Dr. Rayann Heman in July 2017.  He had a left atrial thrombus noted previously and also has a history of bilateral pulmonary emboli in 2014 and has been recommended for lifelong anticoagulation. He had been drinking heavily up to 9 shots of alcohol per night due to depression and anxiety over separation from his wife. He apparently drinks approximately 1 gallon per week. He been doing relatively well since his atrial fibrillation ablation. He did start to have tachycardia yesterday, precipitated by albuterol inhaler. He woke at 3 AM with racing heart rate and presented to the emergency room with SVT at a rate of 177. He had some mild chest pain and an abnormal troponin. Postconversion EKG did not show ischemic changes. He did have minor chest pain overnight relieved with Maalox.  Today, he denies symptoms of palpitations, chest pain, shortness of breath, orthopnea, PND, lower extremity edema, dizziness, presyncope, syncope, or neurologic sequela. The patient is tolerating medications without difficulties and is otherwise without complaint today.   Past Medical History:  Diagnosis Date  . Anxiety   . Arthritis   . Benign neoplasm of colon   . Benign prostatic hyperplasia with urinary obstruction   . Bilateral pulmonary embolism ([place]) 3/14   admitted to [facility],  treated with Xarelto  . Cataract   . Chronic pain   . History of pulmonary embolism 03/19/2012  . Hyperlipemia   . Hypertension   . Insomnia   . Major depressive disorder, recurrent episode ([place])   . Mitral regurgitation 04/24/2013   Mild by TEE  . Obesity   . OSA (obstructive sleep  apnea)    noncompliant with CPAP.  07/27/13- awaiting a CPAP,  . Osteoporosis   . Persistent atrial fibrillation ([place])   . Restless leg syndrome    takes gabapentin  . Sciatica   . SVT (supraventricular tachycardia) ([place]) 03/07/2016  . Thrombus of left atrial appendage 03/09/2013  . Ventral hernia, unspecified, without mention of obstruction or gangrene    right abdominal wall   Past Surgical History:  Procedure Laterality Date  . CARDIOVERSION N/A 03/21/2012   Procedure: CARDIOVERSION;  Surgeon: Birdie Riddle, MD;  Location: [facility] ENDOSCOPY;  Service: Cardiovascular;  Laterality: N/A;  . CARDIOVERSION N/A 04/24/2013   Procedure: CARDIOVERSION;  Surgeon: Dorothy Spark, MD;  Location: [facility] ENDOSCOPY;  Service: Cardiovascular;  Laterality: N/A;  . CARDIOVERSION N/A 06/27/2015   Procedure: CARDIOVERSION;  Surgeon: Larey Dresser, MD;  Location: [facility];  Service: Cardiovascular;  Laterality: N/A;  . CATARACT EXTRACTION    . COLONOSCOPY W/ POLYPECTOMY    . ELECTROPHYSIOLOGIC STUDY N/A 07/30/2015   Procedure: Atrial Fibrillation Ablation;  Surgeon: Thompson Grayer, MD;  Location: [facility] CV LAB;  Service: Cardiovascular;  Laterality: N/A;  . EYE SURGERY Right    cataract  . FEMUR FRACTURE SURGERY    . HERNIA REPAIR  07/06/2011  . INGUINAL HERNIA REPAIR Right 07/28/2013   Procedure: RIGHT INGUINAL HERNIA REPAIR;  Surgeon: Imogene Burn. Georgette Dover, MD;  Location: [facility];  Service: General;  Laterality: Right;  . INSERTION OF MESH Right 07/28/2013   Procedure: INSERTION OF MESH;  Surgeon: Imogene Burn. Tsuei, MD;  Location: [facility] OR;  Service: General;  Laterality: Right;  . KNEE ARTHROSCOPY     left  . REPLACEMENT TOTAL KNEE Left   . ROTATOR CUFF REPAIR     right  . ROTATOR CUFF REPAIR Left 03/08/2014   DR SUPPLE  . SHOULDER ARTHROSCOPY WITH ROTATOR CUFF REPAIR AND SUBACROMIAL DECOMPRESSION Left 03/08/2014   Procedure: LEFT SHOULDER ARTHROSCOPY WITH ROTATOR CUFF REPAIR/SUBACROMIAL DECOMPRESSION/DISTAL  CLAVICLE RESECTION;  Surgeon: Marin Shutter, MD;  Location: [facility];  Service: Orthopedics;  Laterality: Left;  . TEE WITHOUT CARDIOVERSION N/A 03/21/2012   Procedure: TRANSESOPHAGEAL ECHOCARDIOGRAM (TEE);  Surgeon: Birdie Riddle, MD;  Location: [facility];  Service: Cardiovascular;  Laterality: N/A;  . TEE WITHOUT CARDIOVERSION N/A 04/24/2013   Procedure: TRANSESOPHAGEAL ECHOCARDIOGRAM (TEE);  Surgeon: Dorothy Spark, MD;  Location: [facility];  Service: Cardiovascular;  Laterality: N/A;  . TEE WITHOUT CARDIOVERSION N/A 07/29/2015   Procedure: TRANSESOPHAGEAL ECHOCARDIOGRAM (TEE);  Surgeon: Fay Records, MD;  Location: [facility] ENDOSCOPY;  Service: Cardiovascular;  Laterality: N/A;  . TONSILLECTOMY      . amLODipine  10 mg Oral Daily  . carvedilol  6.25 mg Oral BID  . escitalopram  20 mg Oral Daily  . gabapentin  800 mg Oral Q supper  . losartan  100 mg Oral Daily  . tamsulosin  0.4 mg Oral BID  . warfarin  5 mg Oral ONCE-1800  . Warfarin - Pharmacist Dosing Inpatient   Does not apply q1800     Allergies  Allergen Reactions  . Ace Inhibitors Cough  . Albuterol Other (See Comments)    Racing heart    Social History   Social History  . Marital status: Married    Spouse name: N/A  . Number of children: N/A  . Years of education: N/A   Occupational History  . Not on file.   Social History Main Topics  . Smoking status: Former Research scientist (life sciences)  . Smokeless tobacco: Never Used  . Alcohol use 3.6 oz/week    6 Shots of liquor per week     Comment: 2 drinks per night  . Drug use: No     Comment: negative hx for IV drug abuse  . Sexual activity: Not on file   Other Topics Concern  . Not on file   Social History Narrative   Lives with wife in [place]   Retired Dietitian    Family History  Problem Relation Age of Onset  . Cancer Father     bone  . Cancer Paternal Grandmother     ovarian  . CVA Other     Fam Hx of multiple myeloma  . Diabetes Other      Fam Hx of DM    ROS- All systems are reviewed and negative except as per the HPI above  Physical Exam: Telemetry: Vitals:   03/07/16 2027 03/08/16 0526 03/08/16 0811 03/08/16 0835  BP: (!) 154/96 (!) 144/98 (!) 142/100 124/81  Pulse: 73 78 79   Resp:  18    Temp: 99 F (37.2 C) 98.5 F (36.9 C) 98.7 F (37.1 C)   TempSrc: Oral Oral Oral   SpO2: 96% 98% 99%   Weight:  246 lb 3.2 oz (111.7 kg)    Height:        GEN- The patient is well appearing, alert and oriented x 3 today.   Head- normocephalic, atraumatic Eyes-  Sclera clear, conjunctiva pink Ears- hearing intact Oropharynx- clear Neck- supple, no  JVP Lymph- no cervical lymphadenopathy Lungs- Clear to ausculation bilaterally, normal work of breathing Heart- Regular rate and rhythm, no murmurs, rubs or gallops, PMI not laterally displaced GI- soft, NT, ND, + BS Extremities- no clubbing, cyanosis, or edema MS- no significant deformity or atrophy Skin- no rash or lesion Psych- euthymic mood, full affect Neuro- strength and sensation are intact  EKG: SVT rate 177, lateral ST depressions Telemetry: sinus rhythm with PVCs  Labs:   Lab Results  Component Value Date   WBC 15.2 (H) 03/07/2016   HGB 15.6 03/07/2016   HCT 44.7 03/07/2016   MCV 91.6 03/07/2016   PLT 217 03/07/2016    Recent Labs Lab 03/07/16 0950 03/08/16 0351  NA 142 141  K 3.5 2.7*  CL 102 101  CO2 22 32  BUN 18 8  CREATININE 1.26* 0.90  CALCIUM 9.8 8.8*  PROT 6.8  --   BILITOT 1.1  --   ALKPHOS 61  --   ALT 22  --   AST 37  --   GLUCOSE 170* 108*   Lab Results  Component Value Date   TROPONINI 0.24 (HH) 03/08/2016    Lab Results  Component Value Date   CHOL 170 03/17/2012   CHOL 209 (HH) 07/04/2007   Lab Results  Component Value Date   HDL 30 (L) 03/17/2012   HDL 35.9 (L) 07/04/2007   Lab Results  Component Value Date   LDLCALC 117 (H) 03/17/2012   LDLCALC 151 (H) 07/04/2007   Lab Results  Component Value Date   TRIG  117 03/17/2012   TRIG 111 07/04/2007   Lab Results  Component Value Date   CHOLHDL 5.7 03/17/2012   CHOLHDL 5.8 CALC 07/04/2007   No results found for: LDLDIRECT    Radiology: No acute cardiopulmonary disease.  Echo 628/17: - Left ventricle: The cavity size was normal. Wall thickness was   increased in a pattern of mild LVH. The estimated ejection   fraction was 45%. Diffuse hypokinesis. Indeterminant diastolic   function. - Aortic valve: There was no stenosis. There was trivial   regurgitation. - Aorta: Dilated aortic root. Aortic root dimension: 43 mm (ED). - Mitral valve: There was mild regurgitation. - Left atrium: The atrium was moderately dilated. - Right ventricle: The cavity size was normal. Systolic function   was normal. - Right atrium: The atrium was moderately dilated. - Tricuspid valve: Peak RV-RA gradient (S): 20 mm Hg. - Pulmonary arteries: PA peak pressure: 23 mm Hg (S). - Inferior vena cava: The vessel was normal in size. The   respirophasic diameter changes were in the normal range (= 50%),   consistent with normal central venous pressure.  ASSESSMENT AND PLAN:   1. SVT: Patient presented with a CT and a heart rate of 177 with some mild chest pain. Troponin was checked and was elevated. He has not had chest pain prior to or since that episode other than one episode of chest pain that was relieved with Maalox. I have discussed with him the option of further therapy for his SVT. Ablation was discussed. He understands the risks of ablation, as he has had one in the past. It is likely that his SVT is due to either AVNRT versus ORT, as it did convert to sinus rhythm with adenosine. He currently takes carvedilol 6.25 mg at home. Would increase that to 12.5 mg twice a day. We'll also have him follow-up in clinic with Dr. Rayann Heman.  2. Persistent atrial fibrillation: Remains in  sinus rhythm without evidence of further atrial fibrillation. Of note he does say that his  symptoms of this most recent tachycardia were not similar to his atrial fibrillation symptoms. We'll continue his anticoagulation with Coumadin.  This patients CHA2DS2-VASc Score and unadjusted Ischemic Stroke Rate (% per year) is equal to 3.2 % stroke rate/year from a score of 3  Above score calculated as 1 point each if present [CHF, HTN, DM, Vascular=MI/PAD/Aortic Plaque, Age if 65-74, or Male] Above score calculated as 2 points each if present [Age > 75, or Stroke/TIA/TE]  Rachal Dvorsky Meredith Leeds, MD 03/08/2016  11:09 AM

## 2016-03-08 NOTE — Progress Notes (Addendum)
ANTICOAGULATION CONSULT NOTE - Initial Consult  Pharmacy Consult for Warfarin   Indication: atrial fibrillation  Assessment: 89 yoM admitted 3/3 with Afib on warfarin pta. INR on admission 1.87 (subtherapeutic). Today's INR is 1.58 (subtherapeutic) with a stable CBC and no bleeding noted.   PTA warfarin 2.5 mg MWF and 5 mg AOD (27.5 mg/wk) - last dose 3/2  Goal of Therapy:  Goal INR 2-3 Monitor platelets by anticoagulation protocol: Yes   Plan:  Warfarin 5 mg x 1 Daily INR   Allergies  Allergen Reactions  . Ace Inhibitors Cough  . Albuterol Other (See Comments)    Racing heart    Patient Measurements: Height: 6\' 4"  (193 cm) Weight: 246 lb 3.2 oz (111.7 kg) IBW/kg (Calculated) : 86.8  Vital Signs: Temp: 98.5 F (36.9 C) (03/04 0526) Temp Source: Oral (03/04 0526) BP: 144/98 (03/04 0526) Pulse Rate: 78 (03/04 0526)  Labs:  Recent Labs  03/07/16 0950 03/07/16 1618 03/07/16 2148 03/08/16 0351  HGB 15.6  --   --   --   HCT 44.7  --   --   --   PLT 217  --   --   --   LABPROT 21.8*  --   --  19.0*  INR 1.87  --   --  1.58  CREATININE 1.26*  --   --  0.90  TROPONINI  --  0.41* 0.35* 0.24*    Estimated Creatinine Clearance: 109 mL/min (by C-G formula based on SCr of 0.9 mg/dL).  Belia Heman, PharmD PGY1 Pharmacy Resident [phone] (Pager) 03/08/2016 7:34 AM

## 2016-03-09 ENCOUNTER — Other Ambulatory Visit: Payer: Self-pay | Admitting: Physician Assistant

## 2016-03-09 DIAGNOSIS — I119 Hypertensive heart disease without heart failure: Secondary | ICD-10-CM

## 2016-03-09 DIAGNOSIS — E876 Hypokalemia: Secondary | ICD-10-CM

## 2016-03-09 DIAGNOSIS — I208 Other forms of angina pectoris: Secondary | ICD-10-CM

## 2016-03-09 DIAGNOSIS — F1010 Alcohol abuse, uncomplicated: Secondary | ICD-10-CM

## 2016-03-09 DIAGNOSIS — E669 Obesity, unspecified: Secondary | ICD-10-CM

## 2016-03-09 DIAGNOSIS — I248 Other forms of acute ischemic heart disease: Secondary | ICD-10-CM

## 2016-03-09 LAB — BASIC METABOLIC PANEL
Anion gap: 8 (ref 5–15)
BUN: 9 mg/dL (ref 6–20)
CO2: 32 mmol/L (ref 22–32)
Calcium: 9.3 mg/dL (ref 8.9–10.3)
Chloride: 101 mmol/L (ref 101–111)
Creatinine, Ser: 0.88 mg/dL (ref 0.61–1.24)
GFR calc Af Amer: >60 mL/min (ref >60)
GFR calc non Af Amer: >60 mL/min (ref >60)
Glucose, Bld: 104 mg/dL — ABNORMAL HIGH (ref 65–99)
Potassium: 3.2 mmol/L — ABNORMAL LOW (ref 3.5–5.1)
Sodium: 141 mmol/L (ref 135–145)

## 2016-03-09 LAB — PROTIME-INR
INR: 1.75
Prothrombin Time: 20.7 seconds — ABNORMAL HIGH (ref 11.4–15.2)

## 2016-03-09 MED ORDER — POTASSIUM CHLORIDE CRYS ER 20 MEQ PO TBCR
40.0000 meq | EXTENDED_RELEASE_TABLET | Freq: Two times a day (BID) | ORAL | Status: DC
  Administered 2016-03-09: 40 meq via ORAL
  Filled 2016-03-09: qty 4
  Filled 2016-03-09: qty 2

## 2016-03-09 MED ORDER — SPIRONOLACTONE 25 MG PO TABS: 12.5000 mg | ORAL_TABLET | Freq: Every day | ORAL | 3 refills | Status: DC

## 2016-03-09 MED ORDER — CARVEDILOL 12.5 MG PO TABS: 12.5000 mg | ORAL_TABLET | Freq: Two times a day (BID) | ORAL | 6 refills | Status: DC

## 2016-03-09 NOTE — Plan of Care (Signed)
Problem: Education: Goal: Knowledge of [facility] Education information/materials will improve Outcome: Progressing Patient aware of plan of care.  RN provided medication education on all medications administered thus far this shift.  Patient stated understanding.     

## 2016-03-09 NOTE — Discharge Instructions (Signed)
You are scheduled for a stress test 02/13/15 at 7:15 AM, at Dr. Jackalyn Lombard office, and your labs will be done after

## 2016-03-09 NOTE — Progress Notes (Signed)
SUBJECTIVE: The patient is doing well today.  At this time, he denies chest pain, shortness of breath, or any new concerns.  Marland Kitchen amLODipine  10 mg Oral Daily  . carvedilol  12.5 mg Oral BID  . escitalopram  20 mg Oral Daily  . gabapentin  800 mg Oral Q supper  . losartan  100 mg Oral Daily  . potassium chloride SA  40 mEq Oral BID WC  . tamsulosin  0.4 mg Oral BID  . Warfarin - Pharmacist Dosing Inpatient   Does not apply q1800     OBJECTIVE: Physical Exam: Vitals:   03/08/16 2130 03/08/16 2156 03/09/16 0535 03/09/16 0551  BP: (!) 131/103  (!) 135/110 (!) 130/100  Pulse: 90  92   Resp: 19  13   Temp:  98 F (36.7 C) 97.5 F (36.4 C)   TempSrc:  Oral Oral   SpO2: 97%  98%   Weight:   235 lb 9.6 oz (106.9 kg)   Height:        Intake/Output Summary (Last 24 hours) at 03/09/16 0743 Last data filed at 03/09/16 0740  Gross per 24 hour  Intake              120 ml  Output             2350 ml  Net            -2230 ml    Telemetry is reviewed by myself, SR, occ at times frequent PVC's, occ couplets  GEN- The patient is well appearing, alert and oriented x 3 today.   Head- normocephalic, atraumatic Eyes-  Sclera clear, conjunctiva pink Ears- hearing intact Oropharynx- clear Neck- supple, no JVP Lungs- CTA b/l,  normal work of breathing Heart- RRR no significant murmurs, no rubs or gallops GI- soft, NT, ND Extremities- no clubbing, cyanosis, or edema Skin- no rash or lesion Psych- euthymic mood, full affect Neuro- no gross deficits appreciated  LABS: Basic Metabolic Panel:  Recent Labs  03/08/16 1447 03/09/16 0342  NA 140 141  K 3.1* 3.2*  CL 107 101  CO2 27 32  GLUCOSE 96 104*  BUN 8 9  CREATININE 0.79 0.88  CALCIUM 8.9 9.3   Liver Function Tests:  Recent Labs  03/07/16 0950  AST 37  ALT 22  ALKPHOS 61  BILITOT 1.1  PROT 6.8  ALBUMIN 4.5   No results for input(s): LIPASE, AMYLASE in the last 72 hours. CBC:  Recent Labs  03/07/16 0950    WBC 15.2*  NEUTROABS 10.6*  HGB 15.6  HCT 44.7  MCV 91.6  PLT 217   Cardiac Enzymes:  Recent Labs  03/07/16 1618 03/07/16 2148 03/08/16 0351  TROPONINI 0.41* 0.35* 0.24*   07/19/13: Lexiscan stress test Impression Exercise Capacity:  Lexiscan with low level exercise. BP Response:  Hypertensive blood pressure response. Clinical Symptoms:  Nausea, vomiting, no chest pain ECG Impression:  There are scattered PVCs. Comparison with Prior Nuclear Study: No previous nuclear study performed Overall Impression:  Intermediate risk stress nuclear study with fixed inferior defect, more suggestive of artifact than scar. LV Ejection Fraction: 49%.  LV Wall Motion:  Mild inferior hypokinesis and incoordinate septal motion  03/08/16: TTE Study Conclusions - Left ventricle: The cavity size was normal. Wall thickness was   increased in a pattern of moderate LVH. Systolic function was   normal. The estimated ejection fraction was in the range of 50%   to 55%. Wall motion was  normal; there were no regional wall   motion abnormalities. Doppler parameters are consistent with   abnormal left ventricular relaxation (grade 1 diastolic   dysfunction). - Right atrium: The atrium was mildly dilated.   ASSESSMENT AND PLAN:   1. SVT:       Responded to adenosine      Saw by Dr. Curt Bears yesterday, BB was increased, consideration for ablation pending further d/w Dr. Rayann Heman in the office        2. Persistent atrial fibrillation:      CHA2DS2Vasc is 3, on warfarin     S/p AFib ablation July 2017 w/Dr. Jansel Vonstein     Pharmacy managing in-pt     Coumadin clinic out patient     1.75 this morning  3. CP     Mild abn Trop, peak 0.41, with trend down     Likely demand secondary to SVT (likely for hours)     Stress test July 2015 noted, fixed defect felt likely to be artifact then scar     Not an ongoing complaint     No WMA on his echo, EKG out of SVT do not have ischemic changes     Will d/w Dr.  Rayann Heman, doubt need for an ischemic w/u  4. Hypokalemia     Replacement continues     Further this morning already ordered  5. HTN     BB was up-titrated yesterday     Follow  6. ETOH abuse     He is counseled and receptive  7. PVC's     Continue BB and K+ replacement        Tommye Standard, PA-C 03/09/2016 7:43 AM  I have seen, examined the patient, and reviewed the above assessment and plan.  On exam, RRR. Changes to above are made where necessary.   I worry about heavy ETOH consumption and have discussed with him today.  Will increase coreg for rate control.  Also add spironolactone 12.5mg  daily for low K and elevated BP.   Out patient Brantley Fling is ordered. DC to home.  Follow-up with me in 4 weeks.  Co Sign: Thompson Grayer, MD 03/09/2016 10:01 AM

## 2016-03-09 NOTE — Progress Notes (Signed)
On call for Cardiology paged pertaining to patient's morning potassium of 3.2; potassium yesterday morning was 3.1 and patient received 34meq of potassium orally.

## 2016-03-09 NOTE — Discharge Summary (Signed)
DISCHARGE SUMMARY    Patient ID: Brandon [name].,  MRN: [id number], DOB/AGE: 1950/02/21 66 y.o.  Admit date: 03/07/2016 Discharge date: 03/09/2016  Primary Care Physician: Nilda Simmer, MD Primary Cardiologist/Electrophysiologist: Dr. Rayann Heman  Primary Discharge Diagnosis:  1. SVT 2. CP  Secondary Discharge Diagnosis:  1. HTN 2. Persistent AFib 3. ETOH abuse  Allergies  Allergen Reactions  . Ace Inhibitors Cough  . Albuterol Other (See Comments)    Racing heart     Procedures This Admission:  None  Brief HPI: Brandon [name]. is a 66 y.o. male was admitted to [facility] w c/o CP, palpitations, noted in SVT treated with successful cardioversion by IV adenosine.  Hospital Course:  The patient has PMHx of paroxysmal atrial fibrillation which became persistent. He failed amiodarone and several cardioversions and eventually underwent catheter ablation by Dr. Rayann Heman in July 2017. He had a left atrial thrombus noted previously and also has a history of bilateral pulmonary emboli in 2014 and has been recommended for lifelong anticoagulation. He had been drinking heavily up to 9 shots of alcohol per night due to depression and anxiety over separation from his wife, he has HTN, HLD as well.  He woke to use the restroom about 3AM on the day of his admission noting his heart was racing, several hours later this was ongoing and felt CP and came to the ER.  He was noted in an SVT 177bpm and treated with Adenosine with conversion to SR, this resolving his CP.  His post conversion (SR) EKS's did not have ischemic changes appreciated.  He was also nted to be hypokalemic and replacement started.  His telemetry has remained SR with very brief PAT's only.  His BP has been elevated as well and his coreg is up-titrated.  We will add spironolactone for BP as well as hypokalemia.  He had mild Troponin elevation with peak at 0.41 and subsequently trended downward with no ongoing c/o CP.  He had a stress test in  2015, actually felt to be low risk with defect noted thought to be an artifact.  The patient had an echo yesterday with LVEF 50-55%, no WMA.  He is chronically on warfarin, arrived subtherapeutic,  Though historically on his home regime has been stable and therapeutic.  He was given 2 days of 5mg  here, will resume his home regime and is scheduled for labs in the office (INR and BMET) on 03/13/15.  He is scheduled the same day for a stress test and f/u has been arranged with Dr. Rayann Heman as well.  The patient is feeling well this morning, would like to be discharged.  The patient was counseled on ETOH and was receptive, and verbalized motivation to quit drinking all together.  He has not shown any indication or signs of withdrawal here.  The patient was examined by Dr. Rayann Heman and considered stable for discharge to home.    Physical Exam: Vitals:   03/08/16 2130 03/08/16 2156 03/09/16 0535 03/09/16 0551  BP: (!) 131/103  (!) 135/110 (!) 130/100  Pulse: 90  92   Resp: 19  13   Temp:  98 F (36.7 C) 97.5 F (36.4 C)   TempSrc:  Oral Oral   SpO2: 97%  98%   Weight:   235 lb 9.6 oz (106.9 kg)   Height:         Labs:   Lab Results  Component Value Date   WBC 15.2 (H) 03/07/2016   HGB  15.6 03/07/2016   HCT 44.7 03/07/2016   MCV 91.6 03/07/2016   PLT 217 03/07/2016    Recent Labs Lab 03/07/16 0950  03/09/16 0342  NA 142  < > 141  K 3.5  < > 3.2*  CL 102  < > 101  CO2 22  < > 32  BUN 18  < > 9  CREATININE 1.26*  < > 0.88  CALCIUM 9.8  < > 9.3  PROT 6.8  --   --   BILITOT 1.1  --   --   ALKPHOS 61  --   --   ALT 22  --   --   AST 37  --   --   GLUCOSE 170*  < > 104*  < > = values in this interval not displayed.  Discharge Medications:  Allergies as of 03/09/2016      Reactions   Ace Inhibitors Cough   Albuterol Other (See Comments)   Racing heart      Medication List    STOP taking these medications   pantoprazole 40 MG tablet Commonly known as:  PROTONIX     TAKE these  medications   amLODipine 10 MG tablet Commonly known as:  NORVASC Take 10 mg by mouth daily.   carvedilol 12.5 MG tablet Commonly known as:  COREG Take 1 tablet (12.5 mg total) by mouth 2 (two) times daily. What changed:  medication strength  how much to take   cyclobenzaprine 5 MG tablet Commonly known as:  FLEXERIL Take 1 tablet by mouth daily as needed for muscle spasms.   escitalopram 20 MG tablet Commonly known as:  LEXAPRO Take 20 mg by mouth daily.   gabapentin 800 MG tablet Commonly known as:  NEURONTIN Take 800 mg by mouth See admin instructions. TAKE 800 MG BY MOUTH DAILY WITH SUPPER AND 800 MG AT BEDTIME AS NEEDED FOR NERVE PAIN   losartan 100 MG tablet Commonly known as:  COZAAR Take 100 mg by mouth daily.   spironolactone 25 MG tablet Commonly known as:  ALDACTONE Take 0.5 tablets (12.5 mg total) by mouth daily.   tamsulosin 0.4 MG Caps capsule Commonly known as:  FLOMAX Take 0.4 mg by mouth 2 (two) times daily.   warfarin 5 MG tablet Commonly known as:  COUMADIN TAKE 1 TABLET AS DIRECTED BY COUMADIN CLINIC What changed:  See the new instructions.       Disposition: Home Discharge Instructions    Diet - low sodium heart healthy    Complete by:  As directed    Increase activity slowly    Complete by:  As directed      Follow-up Information    [place] Office Follow up on 03/12/2016.   Specialty:  Cardiology Why:  7:15 AM stress test (please arrive 15 minutes ahead of time) you will have labs for coumadin check(INR) and electrolytes after your stress test as well. Contact information: [address], [address] [place]       Thompson Grayer, MD Follow up on 03/23/2016.   Specialty:  Cardiology Why:  10:15AM Contact information: [place] [address] [phone]           Duration of Discharge Encounter: Greater than 30 minutes including physician  time.  Venetia Night, PA-C 03/09/2016 10:07 AM  Thompson Grayer, MD

## 2016-03-10 ENCOUNTER — Telehealth (HOSPITAL_COMMUNITY): Payer: Self-pay | Admitting: *Deleted

## 2016-03-10 NOTE — Telephone Encounter (Signed)
Patient given detailed instructions per Myocardial Perfusion Study Information Sheet for the test on 03/12/16. Patient notified to arrive 15 minutes early and that it is imperative to arrive on time for appointment to keep from having the test rescheduled.  If you need to cancel or reschedule your appointment, please call the office within 24 hours of your appointment. Failure to do so may result in a cancellation of your appointment, and a $50 no show fee. Patient verbalized understanding. Brandon [name]    

## 2016-03-11 DIAGNOSIS — Z23 Encounter for immunization: Secondary | ICD-10-CM | POA: Diagnosis not present

## 2016-03-11 DIAGNOSIS — Z7901 Long term (current) use of anticoagulants: Secondary | ICD-10-CM | POA: Diagnosis not present

## 2016-03-11 DIAGNOSIS — I10 Essential (primary) hypertension: Secondary | ICD-10-CM | POA: Diagnosis not present

## 2016-03-11 DIAGNOSIS — Z86711 Personal history of pulmonary embolism: Secondary | ICD-10-CM | POA: Diagnosis not present

## 2016-03-11 DIAGNOSIS — I481 Persistent atrial fibrillation: Secondary | ICD-10-CM | POA: Diagnosis not present

## 2016-03-11 DIAGNOSIS — I471 Supraventricular tachycardia: Secondary | ICD-10-CM | POA: Diagnosis not present

## 2016-03-11 DIAGNOSIS — Z6830 Body mass index (BMI) 30.0-30.9, adult: Secondary | ICD-10-CM | POA: Diagnosis not present

## 2016-03-12 ENCOUNTER — Ambulatory Visit (HOSPITAL_COMMUNITY): Payer: Medicare Other | Attending: Internal Medicine

## 2016-03-12 ENCOUNTER — Other Ambulatory Visit: Payer: Medicare Other | Admitting: *Deleted

## 2016-03-12 ENCOUNTER — Ambulatory Visit (INDEPENDENT_AMBULATORY_CARE_PROVIDER_SITE_OTHER): Payer: Medicare Other

## 2016-03-12 DIAGNOSIS — Z5181 Encounter for therapeutic drug level monitoring: Secondary | ICD-10-CM

## 2016-03-12 DIAGNOSIS — I208 Other forms of angina pectoris: Secondary | ICD-10-CM | POA: Diagnosis not present

## 2016-03-12 DIAGNOSIS — E876 Hypokalemia: Secondary | ICD-10-CM

## 2016-03-12 DIAGNOSIS — E785 Hyperlipidemia, unspecified: Secondary | ICD-10-CM | POA: Diagnosis not present

## 2016-03-12 DIAGNOSIS — I10 Essential (primary) hypertension: Secondary | ICD-10-CM | POA: Insufficient documentation

## 2016-03-12 DIAGNOSIS — I510 Cardiac septal defect, acquired: Secondary | ICD-10-CM | POA: Insufficient documentation

## 2016-03-12 DIAGNOSIS — I480 Paroxysmal atrial fibrillation: Secondary | ICD-10-CM | POA: Diagnosis not present

## 2016-03-12 LAB — MYOCARDIAL PERFUSION IMAGING
LV dias vol: 174 mL (ref 62–150)
LV sys vol: 81 mL
Peak HR: 82 {beats}/min
RATE: 0.29
Rest HR: 60 {beats}/min
SDS: 4
SRS: 14
SSS: 18
TID: 0.98

## 2016-03-12 LAB — BASIC METABOLIC PANEL
BUN/Creatinine Ratio: 36 — ABNORMAL HIGH (ref 10–24)
BUN: 36 mg/dL — ABNORMAL HIGH (ref 8–27)
CO2: 22 mmol/L (ref 18–29)
Calcium: 8.9 mg/dL (ref 8.6–10.2)
Chloride: 98 mmol/L (ref 96–106)
Creatinine, Ser: 1 mg/dL (ref 0.76–1.27)
GFR calc Af Amer: 90 mL/min/{1.73_m2} (ref >59)
GFR calc non Af Amer: 78 mL/min/{1.73_m2} (ref >59)
Glucose: 95 mg/dL (ref 65–99)
Potassium: 3.5 mmol/L (ref 3.5–5.2)
Sodium: 138 mmol/L (ref 134–144)

## 2016-03-12 LAB — POCT INR: INR: 1.9

## 2016-03-12 MED ORDER — REGADENOSON 0.4 MG/5ML IV SOLN
0.4000 mg | Freq: Once | INTRAVENOUS | Status: AC
  Administered 2016-03-12: 0.4 mg via INTRAVENOUS

## 2016-03-12 MED ORDER — TECHNETIUM TC 99M TETROFOSMIN IV KIT
10.2000 | PACK | Freq: Once | INTRAVENOUS | Status: AC | PRN
  Administered 2016-03-12: 10.2 via INTRAVENOUS
  Filled 2016-03-12: qty 11

## 2016-03-12 MED ORDER — AMINOPHYLLINE 25 MG/ML IV SOLN
75.0000 mg | Freq: Once | INTRAVENOUS | Status: AC
  Administered 2016-03-12: 75 mg via INTRAVENOUS

## 2016-03-12 MED ORDER — TECHNETIUM TC 99M TETROFOSMIN IV KIT
32.6000 | PACK | Freq: Once | INTRAVENOUS | Status: AC | PRN
  Administered 2016-03-12: 32.6 via INTRAVENOUS
  Filled 2016-03-12: qty 33

## 2016-03-12 NOTE — Addendum Note (Signed)
Addended by: Eulis Foster on: 03/12/2016 07:46 AM   Modules accepted: Orders

## 2016-03-23 ENCOUNTER — Encounter: Payer: Self-pay | Admitting: Internal Medicine

## 2016-03-23 ENCOUNTER — Ambulatory Visit (INDEPENDENT_AMBULATORY_CARE_PROVIDER_SITE_OTHER): Payer: Medicare Other | Admitting: Internal Medicine

## 2016-03-23 VITALS — BP 136/100 | HR 71 | Ht 76.0 in | Wt 249.4 lb

## 2016-03-23 DIAGNOSIS — I4819 Other persistent atrial fibrillation: Secondary | ICD-10-CM

## 2016-03-23 DIAGNOSIS — I471 Supraventricular tachycardia: Secondary | ICD-10-CM

## 2016-03-23 DIAGNOSIS — I208 Other forms of angina pectoris: Secondary | ICD-10-CM

## 2016-03-23 DIAGNOSIS — I10 Essential (primary) hypertension: Secondary | ICD-10-CM | POA: Diagnosis not present

## 2016-03-23 DIAGNOSIS — I481 Persistent atrial fibrillation: Secondary | ICD-10-CM | POA: Diagnosis not present

## 2016-03-23 NOTE — Patient Instructions (Signed)
Medication Instructions: - Your physician recommends that you continue on your current medications as directed. Please refer to the Current Medication list given to you today.  Labwork: - Your physician recommends that you have lab work today: BMP/ magnesium  Procedures/Testing: - none ordered  Follow-Up: - Your physician recommends that you schedule a follow-up appointment in: 3 months with Dr. Rayann Heman.    Any Additional Special Instructions Will Be Listed Below (If Applicable).     If you need a refill on your cardiac medications before your next appointment, please call your pharmacy.

## 2016-03-23 NOTE — Progress Notes (Signed)
Electrophysiology Office Note   Date:  03/23/2016   ID:  Brandon Hjort., DOB December 05, 1950, MRN [id number]  PCP:  Nilda Simmer, MD   Primary Electrophysiologist: Thompson Grayer, MD    Chief Complaint  Patient presents with  . Atrial Fibrillation     History of Present Illness: Brandon Geers. is a 66 y.o. male who presents today for electrophysiology follow-up.  Though he has had no afib, he was recently hospitalized with SVT.  Tachycardia terminated with adenosine.  He is not aware of any additional episodes.   Today, he denies symptoms of chest pain, orthopnea, PND, lower extremity edema, claudication, dizziness, presyncope, syncope, bleeding, or neurologic sequela.  Working on ETOH reduction.  The patient is tolerating medications without difficulties and is otherwise without complaint today.    Past Medical History:  Diagnosis Date  . Anxiety   . Arthritis   . Benign neoplasm of colon   . Benign prostatic hyperplasia with urinary obstruction   . Bilateral pulmonary embolism ([place]) 3/14   admitted to [facility]  treated with Xarelto  . Cataract   . Chronic pain   . History of pulmonary embolism 03/19/2012  . Hyperlipemia   . Hypertension   . Insomnia   . Major depressive disorder, recurrent episode ([place])   . Mitral regurgitation 04/24/2013   Mild by TEE  . Obesity   . OSA (obstructive sleep apnea)    noncompliant with CPAP.  07/27/13- awaiting a CPAP,  . Osteoporosis   . Persistent atrial fibrillation ([place])   . Restless leg syndrome    takes gabapentin  . Sciatica   . SVT (supraventricular tachycardia) ([place]) 03/07/2016  . Thrombus of left atrial appendage 03/09/2013  . Ventral hernia, unspecified, without mention of obstruction or gangrene    right abdominal wall   Past Surgical History:  Procedure Laterality Date  . CARDIOVERSION N/A 03/21/2012   Procedure: CARDIOVERSION;  Surgeon: Birdie Riddle, MD;  Location: [facility] ENDOSCOPY;  Service: Cardiovascular;  Laterality: N/A;  .  CARDIOVERSION N/A 04/24/2013   Procedure: CARDIOVERSION;  Surgeon: Dorothy Spark, MD;  Location: [facility] ENDOSCOPY;  Service: Cardiovascular;  Laterality: N/A;  . CARDIOVERSION N/A 06/27/2015   Procedure: CARDIOVERSION;  Surgeon: Larey Dresser, MD;  Location: [place];  Service: Cardiovascular;  Laterality: N/A;  . CATARACT EXTRACTION    . COLONOSCOPY W/ POLYPECTOMY    . ELECTROPHYSIOLOGIC STUDY N/A 07/30/2015   Procedure: Atrial Fibrillation Ablation;  Surgeon: Thompson Grayer, MD;  Location: [place] CV LAB;  Service: Cardiovascular;  Laterality: N/A;  . EYE SURGERY Right    cataract  . FEMUR FRACTURE SURGERY    . HERNIA REPAIR  07/06/2011  . INGUINAL HERNIA REPAIR Right 07/28/2013   Procedure: RIGHT INGUINAL HERNIA REPAIR;  Surgeon: Imogene Burn. Georgette Dover, MD;  Location: [place];  Service: General;  Laterality: Right;  . INSERTION OF MESH Right 07/28/2013   Procedure: INSERTION OF MESH;  Surgeon: Imogene Burn. Georgette Dover, MD;  Location: [place];  Service: General;  Laterality: Right;  . KNEE ARTHROSCOPY     left  . REPLACEMENT TOTAL KNEE Left   . ROTATOR CUFF REPAIR     right  . ROTATOR CUFF REPAIR Left 03/08/2014   DR SUPPLE  . SHOULDER ARTHROSCOPY WITH ROTATOR CUFF REPAIR AND SUBACROMIAL DECOMPRESSION Left 03/08/2014   Procedure: LEFT SHOULDER ARTHROSCOPY WITH ROTATOR CUFF REPAIR/SUBACROMIAL DECOMPRESSION/DISTAL CLAVICLE RESECTION;  Surgeon: Marin Shutter, MD;  Location: [place];  Service: Orthopedics;  Laterality: Left;  .  TEE WITHOUT CARDIOVERSION N/A 03/21/2012   Procedure: TRANSESOPHAGEAL ECHOCARDIOGRAM (TEE);  Surgeon: Birdie Riddle, MD;  Location: [place];  Service: Cardiovascular;  Laterality: N/A;  . TEE WITHOUT CARDIOVERSION N/A 04/24/2013   Procedure: TRANSESOPHAGEAL ECHOCARDIOGRAM (TEE);  Surgeon: Dorothy Spark, MD;  Location: [place];  Service: Cardiovascular;  Laterality: N/A;  . TEE WITHOUT CARDIOVERSION N/A 07/29/2015   Procedure: TRANSESOPHAGEAL ECHOCARDIOGRAM (TEE);  Surgeon:  Fay Records, MD;  Location: [facility] ENDOSCOPY;  Service: Cardiovascular;  Laterality: N/A;  . TONSILLECTOMY       Current Outpatient Prescriptions  Medication Sig Dispense Refill  . acetaminophen (TYLENOL) 325 MG tablet Take 650 mg by mouth every 6 (six) hours as needed (pain).    Marland Kitchen amLODipine (NORVASC) 10 MG tablet Take 10 mg by mouth daily.    . carvedilol (COREG) 12.5 MG tablet Take 1 tablet (12.5 mg total) by mouth 2 (two) times daily. 60 tablet 6  . escitalopram (LEXAPRO) 20 MG tablet Take 20 mg by mouth daily.    Marland Kitchen gabapentin (NEURONTIN) 800 MG tablet Take 800 mg by mouth See admin instructions. TAKE 800 MG BY MOUTH DAILY WITH SUPPER AND 800 MG AT BEDTIME AS NEEDED FOR NERVE PAIN    . losartan (COZAAR) 100 MG tablet Take 100 mg by mouth daily.    Marland Kitchen spironolactone (ALDACTONE) 25 MG tablet Take 0.5 tablets (12.5 mg total) by mouth daily. 30 tablet 3  . tamsulosin (FLOMAX) 0.4 MG CAPS capsule Take 0.4 mg by mouth 2 (two) times daily.     Marland Kitchen warfarin (COUMADIN) 5 MG tablet TAKE 1 TABLET AS DIRECTED BY COUMADIN CLINIC (Patient taking differently: Take 2.5 mg by mouth daily on Monday, Wednesday and Friday. Take 5 mg by mouth daily on all other days) 30 tablet 3   No current facility-administered medications for this visit.     Allergies:   Ace inhibitors and Albuterol   Social History:  The patient  reports that he has quit smoking. He has never used smokeless tobacco. He reports that he drinks about 3.6 oz of alcohol per week . He reports that he does not use drugs.   Family History:  The patient's  family history includes CVA in his other; Cancer in his father and paternal grandmother; Diabetes in his other.    ROS:  Please see the history of present illness.   All other systems are reviewed and negative.    PHYSICAL EXAM: VS:  BP (!) 136/100   Pulse 71   Ht 6\' 4"  (1.93 m)   Wt 249 lb 6.4 oz (113.1 kg)   SpO2 97%   BMI 30.36 kg/m  , BMI Body mass index is 30.36 kg/m. GEN: Well  nourished, well developed, in no acute distress  HEENT: normal  Neck: no JVD, carotid bruits, or masses Cardiac: RRR; no murmurs, rubs, or gallops,no edema  Respiratory:  Diffuse expiratory wheezes which appear upper airway in nature, normal work of breathing GI: soft, nontender, nondistended, + BS MS: no deformity or atrophy  Skin: warm and dry  Psych: euthymic mood, full affect  EKG:  EKG is ordered today. The ekg ordered today shows sinus rhythm 71 bpm with PVCs and PACs, PR 200 msec    Recent Labs: 06/05/2015: TSH 2.07 10/23/2015: B Natriuretic Peptide 91.6; Magnesium 1.8 03/07/2016: ALT 22; Hemoglobin 15.6; Platelets 217 03/12/2016: BUN 36; Creatinine, Ser 1.00; Potassium 3.5; Sodium 138    Lipid Panel     Component Value Date/Time   CHOL  170 03/17/2012 0432   TRIG 117 03/17/2012 0432   HDL 30 (L) 03/17/2012 0432   CHOLHDL 5.7 03/17/2012 0432   VLDL 23 03/17/2012 0432   LDLCALC 117 (H) 03/17/2012 0432     Wt Readings from Last 3 Encounters:  03/23/16 249 lb 6.4 oz (113.1 kg)  03/12/16 254 lb (115.2 kg)  03/09/16 235 lb 9.6 oz (106.9 kg)    ASSESSMENT AND PLAN:  1.  Persistent afib Doing very well s/p ablation off AAD therapy Continue on anticoagulation for chads2vasc score of at least 2.  He has had prior LAA thrombus and thus should probably continue anticoagulation for life.   2. Recent SVT Adenosine sensitive Interestingly, he had VA dissociation with prior EPS.  For now, will follow clinically.  If he has further episodes, will consider EPS.  HTN Stable (he has not taken am meds today) No change required today bmet, mg today  3. OSA Stable No change required today  4. Hypokalemia Recently started on spironolactone ETOh avoidance encouraged bmet, mg today  5. ETOH Cessation advised. He admits that this is hard for him.   Current medicines are reviewed at length with the patient today.   The patient does not have concerns regarding his medicines.   The following changes were made today:  none  Return to see me in 3 months  Signed, Thompson Grayer, MD  03/23/2016 10:42 AM     [facility] [address] [place] [address] [phone] (office) [phone] (fax)

## 2016-03-24 ENCOUNTER — Other Ambulatory Visit: Payer: Medicare Other

## 2016-03-24 ENCOUNTER — Telehealth: Payer: Self-pay

## 2016-03-24 LAB — BASIC METABOLIC PANEL
BUN/Creatinine Ratio: 19 (ref 10–24)
BUN: 17 mg/dL (ref 8–27)
CO2: 24 mmol/L (ref 18–29)
Calcium: 9.2 mg/dL (ref 8.6–10.2)
Chloride: 101 mmol/L (ref 96–106)
Creatinine, Ser: 0.9 mg/dL (ref 0.76–1.27)
GFR calc Af Amer: 102 mL/min/{1.73_m2} (ref >59)
GFR calc non Af Amer: 88 mL/min/{1.73_m2} (ref >59)
Glucose: 109 mg/dL — ABNORMAL HIGH (ref 65–99)
Potassium: 3.6 mmol/L (ref 3.5–5.2)
Sodium: 143 mmol/L (ref 134–144)

## 2016-03-24 LAB — MAGNESIUM: Magnesium: 1.8 mg/dL (ref 1.6–2.3)

## 2016-03-24 NOTE — Telephone Encounter (Signed)
Pt is aware and agreeable to results 

## 2016-03-30 ENCOUNTER — Other Ambulatory Visit: Payer: Self-pay | Admitting: *Deleted

## 2016-03-30 MED ORDER — LOSARTAN POTASSIUM 100 MG PO TABS: 100.0000 mg | ORAL_TABLET | Freq: Every day | ORAL | 3 refills | Status: DC

## 2016-04-02 ENCOUNTER — Ambulatory Visit (INDEPENDENT_AMBULATORY_CARE_PROVIDER_SITE_OTHER): Payer: Medicare Other | Admitting: *Deleted

## 2016-04-02 DIAGNOSIS — Z5181 Encounter for therapeutic drug level monitoring: Secondary | ICD-10-CM

## 2016-04-02 DIAGNOSIS — I208 Other forms of angina pectoris: Secondary | ICD-10-CM | POA: Diagnosis not present

## 2016-04-02 DIAGNOSIS — I480 Paroxysmal atrial fibrillation: Secondary | ICD-10-CM

## 2016-04-02 LAB — POCT INR: INR: 1.5

## 2016-04-13 DIAGNOSIS — M25561 Pain in right knee: Secondary | ICD-10-CM | POA: Diagnosis not present

## 2016-04-13 DIAGNOSIS — M1711 Unilateral primary osteoarthritis, right knee: Secondary | ICD-10-CM | POA: Diagnosis not present

## 2016-04-13 DIAGNOSIS — G8929 Other chronic pain: Secondary | ICD-10-CM | POA: Diagnosis not present

## 2016-04-16 ENCOUNTER — Ambulatory Visit (INDEPENDENT_AMBULATORY_CARE_PROVIDER_SITE_OTHER): Payer: Medicare Other | Admitting: *Deleted

## 2016-04-16 DIAGNOSIS — I208 Other forms of angina pectoris: Secondary | ICD-10-CM | POA: Diagnosis not present

## 2016-04-16 DIAGNOSIS — I480 Paroxysmal atrial fibrillation: Secondary | ICD-10-CM

## 2016-04-16 DIAGNOSIS — Z5181 Encounter for therapeutic drug level monitoring: Secondary | ICD-10-CM

## 2016-04-16 LAB — POCT INR: INR: 2.2

## 2016-04-24 ENCOUNTER — Other Ambulatory Visit: Payer: Self-pay | Admitting: Internal Medicine

## 2016-04-24 MED ORDER — SPIRONOLACTONE 25 MG PO TABS: 12.5000 mg | ORAL_TABLET | Freq: Every day | ORAL | 3 refills | Status: DC

## 2016-04-27 ENCOUNTER — Other Ambulatory Visit: Payer: Self-pay | Admitting: *Deleted

## 2016-04-27 MED ORDER — WARFARIN SODIUM 5 MG PO TABS: ORAL_TABLET | ORAL | 3 refills | Status: DC

## 2016-05-05 ENCOUNTER — Ambulatory Visit (INDEPENDENT_AMBULATORY_CARE_PROVIDER_SITE_OTHER): Payer: Medicare Other

## 2016-05-05 DIAGNOSIS — I480 Paroxysmal atrial fibrillation: Secondary | ICD-10-CM | POA: Diagnosis not present

## 2016-05-05 DIAGNOSIS — Z5181 Encounter for therapeutic drug level monitoring: Secondary | ICD-10-CM

## 2016-05-05 DIAGNOSIS — I208 Other forms of angina pectoris: Secondary | ICD-10-CM

## 2016-05-05 LAB — POCT INR: INR: 2

## 2016-05-06 ENCOUNTER — Ambulatory Visit: Payer: Medicare Other | Admitting: Internal Medicine

## 2016-06-02 ENCOUNTER — Ambulatory Visit (INDEPENDENT_AMBULATORY_CARE_PROVIDER_SITE_OTHER): Payer: Medicare Other | Admitting: Pharmacist

## 2016-06-02 DIAGNOSIS — I208 Other forms of angina pectoris: Secondary | ICD-10-CM | POA: Diagnosis not present

## 2016-06-02 DIAGNOSIS — I480 Paroxysmal atrial fibrillation: Secondary | ICD-10-CM

## 2016-06-02 DIAGNOSIS — Z5181 Encounter for therapeutic drug level monitoring: Secondary | ICD-10-CM

## 2016-06-02 LAB — POCT INR: INR: 1.6

## 2016-06-03 ENCOUNTER — Encounter: Payer: Self-pay | Admitting: Internal Medicine

## 2016-06-18 ENCOUNTER — Ambulatory Visit (INDEPENDENT_AMBULATORY_CARE_PROVIDER_SITE_OTHER): Payer: Medicare Other | Admitting: *Deleted

## 2016-06-18 DIAGNOSIS — I480 Paroxysmal atrial fibrillation: Secondary | ICD-10-CM | POA: Diagnosis not present

## 2016-06-18 DIAGNOSIS — I208 Other forms of angina pectoris: Secondary | ICD-10-CM

## 2016-06-18 DIAGNOSIS — Z5181 Encounter for therapeutic drug level monitoring: Secondary | ICD-10-CM

## 2016-06-18 LAB — POCT INR: INR: 1.3

## 2016-06-22 ENCOUNTER — Encounter: Payer: Self-pay | Admitting: Internal Medicine

## 2016-06-22 ENCOUNTER — Ambulatory Visit (INDEPENDENT_AMBULATORY_CARE_PROVIDER_SITE_OTHER): Payer: Medicare Other | Admitting: Internal Medicine

## 2016-06-22 VITALS — BP 120/78 | HR 84 | Ht 76.0 in | Wt 258.4 lb

## 2016-06-22 DIAGNOSIS — G4733 Obstructive sleep apnea (adult) (pediatric): Secondary | ICD-10-CM

## 2016-06-22 DIAGNOSIS — I481 Persistent atrial fibrillation: Secondary | ICD-10-CM | POA: Diagnosis not present

## 2016-06-22 DIAGNOSIS — I208 Other forms of angina pectoris: Secondary | ICD-10-CM

## 2016-06-22 DIAGNOSIS — I471 Supraventricular tachycardia: Secondary | ICD-10-CM

## 2016-06-22 DIAGNOSIS — I10 Essential (primary) hypertension: Secondary | ICD-10-CM | POA: Diagnosis not present

## 2016-06-22 DIAGNOSIS — I480 Paroxysmal atrial fibrillation: Secondary | ICD-10-CM | POA: Diagnosis not present

## 2016-06-22 DIAGNOSIS — I4819 Other persistent atrial fibrillation: Secondary | ICD-10-CM

## 2016-06-22 LAB — BASIC METABOLIC PANEL
BUN/Creatinine Ratio: 17 (ref 10–24)
BUN: 14 mg/dL (ref 8–27)
CO2: 25 mmol/L (ref 20–29)
Calcium: 8.9 mg/dL (ref 8.6–10.2)
Chloride: 101 mmol/L (ref 96–106)
Creatinine, Ser: 0.83 mg/dL (ref 0.76–1.27)
GFR calc Af Amer: 105 mL/min/{1.73_m2} (ref >59)
GFR calc non Af Amer: 90 mL/min/{1.73_m2} (ref >59)
Glucose: 83 mg/dL (ref 65–99)
Potassium: 3.9 mmol/L (ref 3.5–5.2)
Sodium: 141 mmol/L (ref 134–144)

## 2016-06-22 NOTE — Patient Instructions (Signed)
Medication Instructions:  Your physician recommends that you continue on your current medications as directed. Please refer to the Current Medication list given to you today.   Labwork: Your physician recommends that you return for lab work today: BMET    Testing/Procedures: None ordered   Follow-Up: Your physician wants you to follow-up in: 4 months with Dr. Rayann Heman.      Any Other Special Instructions Will Be Listed Below (If Applicable).     If you need a refill on your cardiac medications before your next appointment, please call your pharmacy.

## 2016-06-22 NOTE — Progress Notes (Signed)
PCP: Brandon Shan, MD  Brandon [name]. is a 66 y.o. male who presents today for routine electrophysiology followup.  Since last being seen in our clinic, the patient reports doing very well.  His arrhythmias appear to be well controlled.  He is pleased with results. Today, he denies symptoms of chest pain, shortness of breath,  lower extremity edema, dizziness, presyncope, or syncope.  The patient is otherwise without complaint today.   Past Medical History:  Diagnosis Date  . Anxiety   . Arthritis   . Benign neoplasm of colon   . Benign prostatic hyperplasia with urinary obstruction   . Bilateral pulmonary embolism ([place]) 3/14   admitted to [facility],  treated with Xarelto  . Cataract   . Chronic pain   . History of pulmonary embolism 03/19/2012  . Hyperlipemia   . Hypertension   . Insomnia   . Major depressive disorder, recurrent episode ([place])   . Mitral regurgitation 04/24/2013   Mild by TEE  . Obesity   . OSA (obstructive sleep apnea)    noncompliant with CPAP.  07/27/13- awaiting a CPAP,  . Osteoporosis   . Persistent atrial fibrillation ([place])   . Restless leg syndrome    takes gabapentin  . Sciatica   . SVT (supraventricular tachycardia) ([place]) 03/07/2016  . Thrombus of left atrial appendage 03/09/2013  . Ventral hernia, unspecified, without mention of obstruction or gangrene    right abdominal wall   Past Surgical History:  Procedure Laterality Date  . CARDIOVERSION N/A 03/21/2012   Procedure: CARDIOVERSION;  Surgeon: Birdie Riddle, MD;  Location[facility] ENDOSCOPY;  Service: Cardiovascular;  Laterality: N/A;  . CARDIOVERSION N/A 04/24/2013   Procedure: CARDIOVERSION;  Surgeon: Dorothy Spark, MD;  Location[facility] ENDOSCOPY;  Service: Cardiovascular;  Laterality: N/A;  . CARDIOVERSION N/A 06/27/2015   Procedure: CARDIOVERSION;  Surgeon: Larey Dresser, MD;  Location: [place];  Service: Cardiovascular;  Laterality: N/A;  . CATARACT EXTRACTION    . COLONOSCOPY W/  POLYPECTOMY    . ELECTROPHYSIOLOGIC STUDY N/A 07/30/2015   Procedure: Atrial Fibrillation Ablation;  Surgeon: Thompson Grayer, MD;  Location: [place] CV LAB;  Service: Cardiovascular;  Laterality: N/A;  . EYE SURGERY Right    cataract  . FEMUR FRACTURE SURGERY    . HERNIA REPAIR  07/06/2011  . INGUINAL HERNIA REPAIR Right 07/28/2013   Procedure: RIGHT INGUINAL HERNIA REPAIR;  Surgeon: Imogene Burn. Georgette Dover, MD;  Location: [place];  Service: General;  Laterality: Right;  . INSERTION OF MESH Right 07/28/2013   Procedure: INSERTION OF MESH;  Surgeon: Imogene Burn. Georgette Dover, MD;  Location: [place];  Service: General;  Laterality: Right;  . KNEE ARTHROSCOPY     left  . REPLACEMENT TOTAL KNEE Left   . ROTATOR CUFF REPAIR     right  . ROTATOR CUFF REPAIR Left 03/08/2014   DR SUPPLE  . SHOULDER ARTHROSCOPY WITH ROTATOR CUFF REPAIR AND SUBACROMIAL DECOMPRESSION Left 03/08/2014   Procedure: LEFT SHOULDER ARTHROSCOPY WITH ROTATOR CUFF REPAIR/SUBACROMIAL DECOMPRESSION/DISTAL CLAVICLE RESECTION;  Surgeon: Marin Shutter, MD;  Location: [place];  Service: Orthopedics;  Laterality: Left;  . TEE WITHOUT CARDIOVERSION N/A 03/21/2012   Procedure: TRANSESOPHAGEAL ECHOCARDIOGRAM (TEE);  Surgeon: Birdie Riddle, MD;  Location: [place];  Service: Cardiovascular;  Laterality: N/A;  . TEE WITHOUT CARDIOVERSION N/A 04/24/2013   Procedure: TRANSESOPHAGEAL ECHOCARDIOGRAM (TEE);  Surgeon: Dorothy Spark, MD;  Location: [facility] ENDOSCOPY;  Service: Cardiovascular;  Laterality: N/A;  . TEE WITHOUT  CARDIOVERSION N/A 07/29/2015   Procedure: TRANSESOPHAGEAL ECHOCARDIOGRAM (TEE);  Surgeon: Fay Records, MD;  Location: [facility] ENDOSCOPY;  Service: Cardiovascular;  Laterality: N/A;  . TONSILLECTOMY      ROS- all systems are reviewed and negatives except as per HPI above  Current Outpatient Prescriptions  Medication Sig Dispense Refill  . acetaminophen (TYLENOL) 325 MG tablet Take 650 mg by mouth every 6 (six) hours as needed (pain).    Marland Kitchen  amLODipine (NORVASC) 10 MG tablet Take 10 mg by mouth daily.    . carvedilol (COREG) 12.5 MG tablet Take 1 tablet (12.5 mg total) by mouth 2 (two) times daily. 60 tablet 6  . escitalopram (LEXAPRO) 20 MG tablet Take 20 mg by mouth daily.    Marland Kitchen gabapentin (NEURONTIN) 800 MG tablet Take 800 mg by mouth See admin instructions. TAKE 800 MG BY MOUTH DAILY WITH SUPPER AND 800 MG AT BEDTIME AS NEEDED FOR NERVE PAIN    . losartan (COZAAR) 100 MG tablet Take 1 tablet (100 mg total) by mouth daily. 90 tablet 3  . spironolactone (ALDACTONE) 25 MG tablet Take 0.5 tablets (12.5 mg total) by mouth daily. 90 tablet 3  . tamsulosin (FLOMAX) 0.4 MG CAPS capsule Take 0.4 mg by mouth 2 (two) times daily.     Marland Kitchen warfarin (COUMADIN) 5 MG tablet TAKE 1 TABLET AS DIRECTED BY COUMADIN CLINIC 30 tablet 3   No current facility-administered medications for this visit.     Physical Exam: Vitals:   06/22/16 1013  BP: 120/78  Pulse: 84  SpO2: 97%  Weight: 258 lb 6.4 oz (117.2 kg)  Height: 6\' 4"  (1.93 m)    GEN- The patient is well appearing, alert and oriented x 3 today.   Head- normocephalic, atraumatic Eyes-  Sclera clear, conjunctiva pink Ears- hearing intact Oropharynx- clear Lungs- Clear to ausculation bilaterally, normal work of breathing Heart- Regular rate and rhythm, no murmurs, rubs or gallops, PMI not laterally displaced GI- soft, NT, ND, + BS Extremities- no clubbing, cyanosis, or edema  EKG tracing ordered today is personally reviewed and shows sinus rhythm 84 bpm,  PR 224 msec, LAD, Qtc 437 msec   Assessment and Plan:  1. Persistent afib No recurrence post ablation off AAD thera[y Given prior LAA thrombus, I would advise long term anticoagulation. He has recently had issues with compliance with coumadin.  He is followed in the coumadin clinic  2. SVT Adenosine sensitive He has a Kardia APP.  I have reviewed this.  He did have a single episode (short per pt) since I saw him last We will  continue to follow conservatively Interestingly, he had VA dissociation with prior EPS.  Consider EP study if episodes increase  3. HTN Stable No change required today bmet today  4. Hypokalemia Improved with spironolactone bmet today  5. OSA Stable No change required today  6. ETOh He has not drank any ETOH is 19 days  Return to see me in 4 months   Thompson Grayer MD, [facility] 06/22/2016 10:34 AM

## 2016-06-26 ENCOUNTER — Ambulatory Visit (INDEPENDENT_AMBULATORY_CARE_PROVIDER_SITE_OTHER): Payer: Medicare Other | Admitting: *Deleted

## 2016-06-26 DIAGNOSIS — Z5181 Encounter for therapeutic drug level monitoring: Secondary | ICD-10-CM

## 2016-06-26 DIAGNOSIS — I208 Other forms of angina pectoris: Secondary | ICD-10-CM

## 2016-06-26 DIAGNOSIS — I480 Paroxysmal atrial fibrillation: Secondary | ICD-10-CM

## 2016-06-26 LAB — POCT INR: INR: 1.5

## 2016-07-06 ENCOUNTER — Ambulatory Visit (INDEPENDENT_AMBULATORY_CARE_PROVIDER_SITE_OTHER): Payer: Self-pay | Admitting: *Deleted

## 2016-07-06 DIAGNOSIS — I480 Paroxysmal atrial fibrillation: Secondary | ICD-10-CM

## 2016-07-06 DIAGNOSIS — Z5181 Encounter for therapeutic drug level monitoring: Secondary | ICD-10-CM

## 2016-07-06 DIAGNOSIS — I208 Other forms of angina pectoris: Secondary | ICD-10-CM

## 2016-07-06 LAB — POCT INR: INR: 1.6

## 2016-07-20 ENCOUNTER — Ambulatory Visit (INDEPENDENT_AMBULATORY_CARE_PROVIDER_SITE_OTHER): Payer: Medicare Other | Admitting: *Deleted

## 2016-07-20 DIAGNOSIS — I208 Other forms of angina pectoris: Secondary | ICD-10-CM | POA: Diagnosis not present

## 2016-07-20 DIAGNOSIS — Z5181 Encounter for therapeutic drug level monitoring: Secondary | ICD-10-CM

## 2016-07-20 DIAGNOSIS — I480 Paroxysmal atrial fibrillation: Secondary | ICD-10-CM | POA: Diagnosis not present

## 2016-07-20 LAB — POCT INR: INR: 2.5

## 2016-07-28 ENCOUNTER — Other Ambulatory Visit: Payer: Self-pay | Admitting: Physician Assistant

## 2016-07-28 ENCOUNTER — Other Ambulatory Visit: Payer: Self-pay | Admitting: Internal Medicine

## 2016-07-28 MED ORDER — SPIRONOLACTONE 25 MG PO TABS: 12.5000 mg | ORAL_TABLET | Freq: Every day | ORAL | 2 refills | Status: DC

## 2016-08-10 ENCOUNTER — Ambulatory Visit (INDEPENDENT_AMBULATORY_CARE_PROVIDER_SITE_OTHER): Payer: Medicare Other | Admitting: *Deleted

## 2016-08-10 DIAGNOSIS — Z5181 Encounter for therapeutic drug level monitoring: Secondary | ICD-10-CM

## 2016-08-10 DIAGNOSIS — I208 Other forms of angina pectoris: Secondary | ICD-10-CM

## 2016-08-10 DIAGNOSIS — I480 Paroxysmal atrial fibrillation: Secondary | ICD-10-CM

## 2016-08-10 LAB — POCT INR: INR: 2.6

## 2016-08-16 ENCOUNTER — Other Ambulatory Visit: Payer: Self-pay | Admitting: Neurology

## 2016-08-20 ENCOUNTER — Other Ambulatory Visit: Payer: Self-pay | Admitting: Internal Medicine

## 2016-08-20 ENCOUNTER — Other Ambulatory Visit: Payer: Self-pay | Admitting: Neurology

## 2016-09-08 ENCOUNTER — Ambulatory Visit (INDEPENDENT_AMBULATORY_CARE_PROVIDER_SITE_OTHER): Payer: Medicare Other | Admitting: *Deleted

## 2016-09-08 DIAGNOSIS — I480 Paroxysmal atrial fibrillation: Secondary | ICD-10-CM

## 2016-09-08 DIAGNOSIS — Z5181 Encounter for therapeutic drug level monitoring: Secondary | ICD-10-CM

## 2016-09-08 LAB — POCT INR: INR: 4.6

## 2016-09-16 ENCOUNTER — Other Ambulatory Visit: Payer: Self-pay | Admitting: Neurology

## 2016-09-17 DIAGNOSIS — M542 Cervicalgia: Secondary | ICD-10-CM | POA: Diagnosis not present

## 2016-09-17 DIAGNOSIS — M5031 Other cervical disc degeneration,  high cervical region: Secondary | ICD-10-CM | POA: Diagnosis not present

## 2016-09-18 ENCOUNTER — Encounter (HOSPITAL_COMMUNITY): Payer: Self-pay | Admitting: Emergency Medicine

## 2016-09-18 DIAGNOSIS — I119 Hypertensive heart disease without heart failure: Secondary | ICD-10-CM

## 2016-09-18 DIAGNOSIS — I471 Supraventricular tachycardia: Secondary | ICD-10-CM | POA: Diagnosis not present

## 2016-09-18 DIAGNOSIS — Z79899 Other long term (current) drug therapy: Secondary | ICD-10-CM | POA: Insufficient documentation

## 2016-09-18 DIAGNOSIS — N23 Unspecified renal colic: Secondary | ICD-10-CM

## 2016-09-18 DIAGNOSIS — N132 Hydronephrosis with renal and ureteral calculous obstruction: Secondary | ICD-10-CM | POA: Diagnosis not present

## 2016-09-18 DIAGNOSIS — K4031 Unilateral inguinal hernia, with obstruction, without gangrene, recurrent: Secondary | ICD-10-CM | POA: Diagnosis not present

## 2016-09-18 DIAGNOSIS — Z7901 Long term (current) use of anticoagulants: Secondary | ICD-10-CM

## 2016-09-18 DIAGNOSIS — J45909 Unspecified asthma, uncomplicated: Secondary | ICD-10-CM | POA: Insufficient documentation

## 2016-09-18 DIAGNOSIS — I481 Persistent atrial fibrillation: Secondary | ICD-10-CM | POA: Diagnosis not present

## 2016-09-18 DIAGNOSIS — Z87891 Personal history of nicotine dependence: Secondary | ICD-10-CM

## 2016-09-18 DIAGNOSIS — Z86711 Personal history of pulmonary embolism: Secondary | ICD-10-CM | POA: Diagnosis not present

## 2016-09-18 DIAGNOSIS — E785 Hyperlipidemia, unspecified: Secondary | ICD-10-CM | POA: Diagnosis not present

## 2016-09-18 DIAGNOSIS — Z96652 Presence of left artificial knee joint: Secondary | ICD-10-CM

## 2016-09-18 DIAGNOSIS — K4030 Unilateral inguinal hernia, with obstruction, without gangrene, not specified as recurrent: Secondary | ICD-10-CM | POA: Diagnosis not present

## 2016-09-18 MED ORDER — ONDANSETRON 4 MG PO TBDP
4.0000 mg | ORAL_TABLET | Freq: Once | ORAL | Status: AC | PRN
  Administered 2016-09-18: 4 mg via ORAL

## 2016-09-18 MED ORDER — ONDANSETRON 4 MG PO TBDP
ORAL_TABLET | ORAL | Status: AC
  Filled 2016-09-18: qty 1

## 2016-09-18 MED ORDER — FENTANYL CITRATE (PF) 100 MCG/2ML IJ SOLN
50.0000 ug | INTRAMUSCULAR | Status: DC | PRN
  Administered 2016-09-18: 100 ug via NASAL

## 2016-09-18 MED ORDER — FENTANYL CITRATE (PF) 100 MCG/2ML IJ SOLN
INTRAMUSCULAR | Status: AC
  Administered 2016-09-18: 100 ug via NASAL
  Filled 2016-09-18: qty 2

## 2016-09-18 NOTE — ED Triage Notes (Signed)
Patient reports RLQ pain with emesis onset this evening , denies diarrhea or fever .

## 2016-09-19 ENCOUNTER — Emergency Department (HOSPITAL_COMMUNITY): Payer: Medicare Other

## 2016-09-19 ENCOUNTER — Emergency Department (HOSPITAL_COMMUNITY)
Admission: EM | Admit: 2016-09-19 | Discharge: 2016-09-19 | Disposition: A | Discharge status: Home / Self Care | Payer: Medicare Other | Source: Home / Self Care | Attending: Emergency Medicine | Admitting: Emergency Medicine

## 2016-09-19 DIAGNOSIS — N132 Hydronephrosis with renal and ureteral calculous obstruction: Secondary | ICD-10-CM | POA: Diagnosis not present

## 2016-09-19 DIAGNOSIS — N23 Unspecified renal colic: Secondary | ICD-10-CM

## 2016-09-19 LAB — URINALYSIS, ROUTINE W REFLEX MICROSCOPIC
Bacteria, UA: NONE SEEN
Bilirubin Urine: NEGATIVE
Glucose, UA: 50 mg/dL — AB
Ketones, ur: NEGATIVE mg/dL
Leukocytes, UA: NEGATIVE
Nitrite: NEGATIVE
Protein, ur: NEGATIVE mg/dL
Specific Gravity, Urine: 1.017 (ref 1.005–1.030)
Squamous Epithelial / LPF: NONE SEEN
pH: 7 (ref 5.0–8.0)

## 2016-09-19 LAB — LIPASE, BLOOD: Lipase: 36 U/L (ref 11–51)

## 2016-09-19 LAB — CBC
HCT: 40.9 % (ref 39.0–52.0)
Hemoglobin: 13.9 g/dL (ref 13.0–17.0)
MCH: 29.9 pg (ref 26.0–34.0)
MCHC: 34 g/dL (ref 30.0–36.0)
MCV: 88 fL (ref 78.0–100.0)
Platelets: 190 10*3/uL (ref 150–400)
RBC: 4.65 MIL/uL (ref 4.22–5.81)
RDW: 13.6 % (ref 11.5–15.5)
WBC: 7.1 10*3/uL (ref 4.0–10.5)

## 2016-09-19 LAB — COMPREHENSIVE METABOLIC PANEL
ALT: 15 U/L — ABNORMAL LOW (ref 17–63)
AST: 23 U/L (ref 15–41)
Albumin: 4.6 g/dL (ref 3.5–5.0)
Alkaline Phosphatase: 76 U/L (ref 38–126)
Anion gap: 12 (ref 5–15)
BUN: 22 mg/dL — ABNORMAL HIGH (ref 6–20)
CO2: 25 mmol/L (ref 22–32)
Calcium: 9.6 mg/dL (ref 8.9–10.3)
Chloride: 102 mmol/L (ref 101–111)
Creatinine, Ser: 1.12 mg/dL (ref 0.61–1.24)
GFR calc Af Amer: >60 mL/min (ref >60)
GFR calc non Af Amer: >60 mL/min (ref >60)
Glucose, Bld: 158 mg/dL — ABNORMAL HIGH (ref 65–99)
Potassium: 3.7 mmol/L (ref 3.5–5.1)
Sodium: 139 mmol/L (ref 135–145)
Total Bilirubin: 0.8 mg/dL (ref 0.3–1.2)
Total Protein: 7.4 g/dL (ref 6.5–8.1)

## 2016-09-19 MED ORDER — KETOROLAC TROMETHAMINE 15 MG/ML IJ SOLN
15.0000 mg | Freq: Once | INTRAMUSCULAR | Status: AC
  Administered 2016-09-19: 15 mg via INTRAVENOUS
  Filled 2016-09-19: qty 1

## 2016-09-19 MED ORDER — ONDANSETRON HCL 4 MG/2ML IJ SOLN
4.0000 mg | Freq: Once | INTRAMUSCULAR | Status: DC
  Filled 2016-09-19: qty 2

## 2016-09-19 MED ORDER — ONDANSETRON 8 MG PO TBDP: 8.0000 mg | ORAL_TABLET | Freq: Three times a day (TID) | ORAL | 0 refills | Status: DC | PRN

## 2016-09-19 MED ORDER — HYDROMORPHONE HCL 1 MG/ML IJ SOLN
1.0000 mg | Freq: Once | INTRAMUSCULAR | Status: AC
  Administered 2016-09-19: 1 mg via INTRAVENOUS
  Filled 2016-09-19 (×2): qty 1

## 2016-09-19 MED ORDER — OXYCODONE-ACETAMINOPHEN 5-325 MG PO TABS: 1.0000 | ORAL_TABLET | ORAL | 0 refills | Status: DC | PRN

## 2016-09-19 MED ORDER — ONDANSETRON 4 MG PO TBDP: 4.0000 mg | ORAL_TABLET | Freq: Once | ORAL | Status: DC | PRN

## 2016-09-19 NOTE — ED Provider Notes (Signed)
[facility] DEPT Provider Note   CSN: [id number] Arrival date & time: 09/18/16  2312     History   Chief Complaint Chief Complaint  Patient presents with  . Abdominal Pain    HPI Ramelo Oetken. is a 66 y.o. male.  HPI 66 year old male presents emergency department with acute onset right sided abdominal pain with radiation towards his right groin and associated nausea vomiting.  He has some urinary frequency.  No fevers or chills. No history kidney stones.  He has never had discomfort or pain like this before.  He was in his normal state health earlier.  No significant flank pain at this time.  Pain at this time is severe in severity   Past Medical History:  Diagnosis Date  . Anxiety   . Arthritis   . Benign neoplasm of colon   . Benign prostatic hyperplasia with urinary obstruction   . Bilateral pulmonary embolism ([place]) 3/14   admitted to [facility],  treated with Xarelto  . Cataract   . Chronic pain   . History of pulmonary embolism 03/19/2012  . Hyperlipemia   . Hypertension   . Insomnia   . Major depressive disorder, recurrent episode ([place])   . Mitral regurgitation 04/24/2013   Mild by TEE  . Obesity   . OSA (obstructive sleep apnea)    noncompliant with CPAP.  07/27/13- awaiting a CPAP,  . Osteoporosis   . Persistent atrial fibrillation ([place])   . Restless leg syndrome    takes gabapentin  . Sciatica   . SVT (supraventricular tachycardia) ([place]) 03/07/2016  . Thrombus of left atrial appendage 03/09/2013  . Ventral hernia, unspecified, without mention of obstruction or gangrene    right abdominal wall    Patient Active Problem List   Diagnosis Date Noted  . SVT (supraventricular tachycardia) ([place]) 03/07/2016  . Obesity (BMI 30-39.9) 03/07/2016  . Alcohol abuse 03/07/2016  . Demand ischemia of myocardium ([place]) 03/07/2016  . Paroxysmal atrial fibrillation (HCC)   . Restless leg syndrome 02/08/2014  . Current use of long term anticoagulation   . Obstructive  sleep apnea 03/09/2013  . Hyperlipidemia 03/21/2012  . History of pulmonary embolism 03/19/2012  . Asthma, exogenous 06/20/2008  . Hypertensive heart disease     Past Surgical History:  Procedure Laterality Date  . CARDIOVERSION N/A 03/21/2012   Procedure: CARDIOVERSION;  Surgeon: Birdie Riddle, MD;  Location: [facility] ENDOSCOPY;  Service: Cardiovascular;  Laterality: N/A;  . CARDIOVERSION N/A 04/24/2013   Procedure: CARDIOVERSION;  Surgeon: Dorothy Spark, MD;  Location: [facility] ENDOSCOPY;  Service: Cardiovascular;  Laterality: N/A;  . CARDIOVERSION N/A 06/27/2015   Procedure: CARDIOVERSION;  Surgeon: Larey Dresser, MD;  Location: [place];  Service: Cardiovascular;  Laterality: N/A;  . CATARACT EXTRACTION    . COLONOSCOPY W/ POLYPECTOMY    . ELECTROPHYSIOLOGIC STUDY N/A 07/30/2015   Procedure: Atrial Fibrillation Ablation;  Surgeon: Thompson Grayer, MD;  Location: [place] CV LAB;  Service: Cardiovascular;  Laterality: N/A;  . EYE SURGERY Right    cataract  . FEMUR FRACTURE SURGERY    . HERNIA REPAIR  07/06/2011  . INGUINAL HERNIA REPAIR Right 07/28/2013   Procedure: RIGHT INGUINAL HERNIA REPAIR;  Surgeon: Imogene Burn. Georgette Dover, MD;  Location: [place];  Service: General;  Laterality: Right;  . INSERTION OF MESH Right 07/28/2013   Procedure: INSERTION OF MESH;  Surgeon: Imogene Burn. Georgette Dover, MD;  Location: [place];  Service: General;  Laterality: Right;  . KNEE ARTHROSCOPY  left  . REPLACEMENT TOTAL KNEE Left   . ROTATOR CUFF REPAIR     right  . ROTATOR CUFF REPAIR Left 03/08/2014   DR SUPPLE  . SHOULDER ARTHROSCOPY WITH ROTATOR CUFF REPAIR AND SUBACROMIAL DECOMPRESSION Left 03/08/2014   Procedure: LEFT SHOULDER ARTHROSCOPY WITH ROTATOR CUFF REPAIR/SUBACROMIAL DECOMPRESSION/DISTAL CLAVICLE RESECTION;  Surgeon: Marin Shutter, MD;  Location: [place];  Service: Orthopedics;  Laterality: Left;  . TEE WITHOUT CARDIOVERSION N/A 03/21/2012   Procedure: TRANSESOPHAGEAL ECHOCARDIOGRAM (TEE);  Surgeon: Birdie Riddle, MD;  Location: [place];  Service: Cardiovascular;  Laterality: N/A;  . TEE WITHOUT CARDIOVERSION N/A 04/24/2013   Procedure: TRANSESOPHAGEAL ECHOCARDIOGRAM (TEE);  Surgeon: Dorothy Spark, MD;  Location: [place];  Service: Cardiovascular;  Laterality: N/A;  . TEE WITHOUT CARDIOVERSION N/A 07/29/2015   Procedure: TRANSESOPHAGEAL ECHOCARDIOGRAM (TEE);  Surgeon: Fay Records, MD;  Location: [facility] ENDOSCOPY;  Service: Cardiovascular;  Laterality: N/A;  . TONSILLECTOMY         Home Medications    Prior to Admission medications   Medication Sig Start Date End Date Taking? Authorizing Provider  acetaminophen (TYLENOL) 500 MG tablet Take 1,500 mg by mouth 3 (three) times daily with meals.    Yes [provider]  amLODipine (NORVASC) 10 MG tablet Take 10 mg by mouth daily.   Yes [provider]  carvedilol (COREG) 12.5 MG tablet Take 1 tablet (12.5 mg total) by mouth 2 (two) times daily. 03/09/16  Yes Baldwin Jamaica, PA-C  escitalopram (LEXAPRO) 20 MG tablet Take 20 mg by mouth daily.   Yes [provider]  gabapentin (NEURONTIN) 800 MG tablet TAKE 1 TABLET EVERY DAY WITH DINNER AND AT BEDTIME AS NEEDED Patient taking differently: TAKE 822m by mouth WITH DINNER AND AT BEDTIME AS NEEDED 08/20/16  Yes Sater, RNanine Means MD  losartan (COZAAR) 100 MG tablet Take 1 tablet (100 mg total) by mouth daily. 03/30/16  Yes Allred, JJeneen Rinks MD  predniSONE (DELTASONE) 5 MG tablet Take 5-30 mg by mouth See admin instructions. Take 6,5,4,3,2,1 tablet until done 09/17/16  Yes [provider]  spironolactone (ALDACTONE) 25 MG tablet Take 0.5 tablets (12.5 mg total) by mouth daily. Patient taking differently: Take 25 mg by mouth daily.  07/28/16  Yes Allred, JJeneen Rinks MD  tamsulosin (FLOMAX) 0.4 MG CAPS capsule Take 0.4 mg by mouth 2 (two) times daily.    Yes [provider]  warfarin (COUMADIN) 5 MG tablet TAKE 1 TABLET AS DIRECTED BY COUMADIN CLINIC Patient  taking differently: TAKE 1 TABLET (571m all days on Mon and Thurs take 1 1/2 tablet (7.44m744m8/16/18  Yes Allred, JamJeneen RinksD  ondansetron (ZOFRAN ODT) 8 MG disintegrating tablet Take 1 tablet (8 mg total) by mouth every 8 (eight) hours as needed for nausea or vomiting. 09/19/16   CamJola SchmidtD  oxyCODONE-acetaminophen (PERCOCET/ROXICET) 5-325 MG tablet Take 1 tablet by mouth every 4 (four) hours as needed for severe pain. 09/19/16   CamJola SchmidtD    Family History Family History  Problem Relation Age of Onset  . Cancer Father        bone  . Cancer Paternal Grandmother        ovarian  . CVA Other        Fam Hx of multiple myeloma  . Diabetes Other        Fam Hx of DM    Social History Social History  Substance Use Topics  . Smoking status: Former [employer] scientist (life sciences) Smokeless  tobacco: Never Used  . Alcohol use 3.6 oz/week    6 Shots of liquor per week     Comment: 2 drinks per night     Allergies   Ace inhibitors and Albuterol   Review of Systems Review of Systems  All other systems reviewed and are negative.    Physical Exam Updated Vital Signs BP (!) 132/110   Pulse 82   Temp 97.6 F (36.4 C) (Oral)   Resp 17   SpO2 99%   Physical Exam  Constitutional: He is oriented to person, place, and time. He appears well-developed and well-nourished.  Uncomfortable appearing  HENT:  Head: Normocephalic and atraumatic.  Eyes: EOM are normal.  Neck: Normal range of motion.  Cardiovascular: Normal rate and regular rhythm.   Pulmonary/Chest: Effort normal and breath sounds normal. No respiratory distress.  Abdominal: Soft. He exhibits no distension. There is no tenderness.  Musculoskeletal: Normal range of motion.  Neurological: He is alert and oriented to person, place, and time.  Skin: Skin is warm and dry.  Psychiatric: He has a normal mood and affect. Judgment normal.  Nursing note and vitals reviewed.    ED Treatments / Results  Labs (all labs ordered are listed,  but only abnormal results are displayed) Labs Reviewed  COMPREHENSIVE METABOLIC PANEL - Abnormal; Notable for the following:       Result Value   Glucose, Bld 158 (*)    BUN 22 (*)    ALT 15 (*)    All other components within normal limits  URINALYSIS, ROUTINE W REFLEX MICROSCOPIC - Abnormal; Notable for the following:    Glucose, UA 50 (*)    Hgb urine dipstick SMALL (*)    All other components within normal limits  LIPASE, BLOOD  CBC    EKG  EKG Interpretation None       Radiology Ct Renal Stone Study  Result Date: 09/19/2016 CLINICAL DATA:  RIGHT flank pain beginning this evening. EXAM: CT ABDOMEN AND PELVIS WITHOUT CONTRAST TECHNIQUE: Multidetector CT imaging of the abdomen and pelvis was performed following the standard protocol without IV contrast. COMPARISON:  CT pelvis June 27, 2013, renal ultrasound October 31, 2014 FINDINGS: LOWER CHEST: Bilateral lower lobe atelectasis. Heart size is upper limits of normal. No pericardial effusion. HEPATOBILIARY: Normal. PANCREAS: Normal. SPLEEN: Normal. ADRENALS/URINARY TRACT: Kidneys are orthotopic, demonstrating normal size and morphology. Mild RIGHT hydroureteronephrosis to the mid ureter where a 6 x 3 mm calculus is present. 3 mm RIGHT lower pole, punctate RIGHT interpolar nephrolithiasis. Mild RIGHT perinephric fat stranding. Limited assessment for renal masses on this nonenhanced examination. The unopacified ureters are normal in course and caliber. Urinary bladder is partially distended and unremarkable. 15 mm LEFT adrenal nodule (0 Hounsfield units), compatible with benign adenoma. STOMACH/BOWEL: The stomach, small and large bowel are normal in course and caliber without inflammatory changes, sensitivity decreased by lack of enteric contrast. Air distended moderate duodenal diverticulum. Mild colonic diverticulosis. Colonic intra positioning. The appendix is not discretely identified, however there are no inflammatory changes in the  right lower quadrant. VASCULAR/LYMPHATIC: Aortoiliac vessels are normal in course and caliber. Mild calcific atherosclerosis. No lymphadenopathy by CT size criteria. REPRODUCTIVE: Normal. OTHER: No intraperitoneal free fluid or free air. MUSCULOSKELETAL: Non-acute. Moderate RIGHT and small LEFT fat containing inguinal hernias. Old LEFT femur fracture. Old LEFT iliac wing fracture. Mild levoscoliosis. Moderate degenerative change of the lumbar spine. IMPRESSION: 1. 4 x 2 mm RIGHT mid ureteral calculus resulting in mild obstructive uropathy. Residual  RIGHT nephrolithiasis measuring to 3 mm. 2. Colonic diverticulosis without acute diverticulitis. Aortic Atherosclerosis (ICD10-I70.0). Electronically Signed   By: Elon Alas M.D.   On: 09/19/2016 02:01    Procedures Procedures (including critical care time)  Medications Ordered in ED Medications  ondansetron (ZOFRAN-ODT) disintegrating tablet 4 mg (not administered)  ondansetron (ZOFRAN-ODT) disintegrating tablet 4 mg (4 mg Oral Given 09/18/16 2334)  HYDROmorphone (DILAUDID) injection 1 mg (1 mg Intravenous Given 09/19/16 0114)  ketorolac (TORADOL) 15 MG/ML injection 15 mg (15 mg Intravenous Given 09/19/16 0110)     Initial Impression / Assessment and Plan / ED Course  I have reviewed the triage vital signs and the nursing notes.  Pertinent labs & imaging results that were available during my care of the patient were reviewed by me and considered in my medical decision making (see chart for details).     2:54 AM Pain control this time.  Patient feels much better.  Right ureteral stone.  Discharge home with standard stone precautions and urology follow-up.  He understands to return to the ER for new or worsening symptoms  Final Clinical Impressions(s) / ED Diagnoses   Final diagnoses:  Ureteral colic    New Prescriptions New Prescriptions   ONDANSETRON (ZOFRAN ODT) 8 MG DISINTEGRATING TABLET    Take 1 tablet (8 mg total) by mouth every  8 (eight) hours as needed for nausea or vomiting.   OXYCODONE-ACETAMINOPHEN (PERCOCET/ROXICET) 5-325 MG TABLET    Take 1 tablet by mouth every 4 (four) hours as needed for severe pain.     Jola Schmidt, MD 09/19/16 [phone]

## 2016-09-21 ENCOUNTER — Inpatient Hospital Stay (HOSPITAL_COMMUNITY): Payer: Medicare Other

## 2016-09-21 ENCOUNTER — Encounter (HOSPITAL_COMMUNITY)
Admission: EM | Disposition: A | Discharge status: Home / Self Care | Payer: Self-pay | Source: Home / Self Care | Attending: Internal Medicine

## 2016-09-21 ENCOUNTER — Encounter (HOSPITAL_COMMUNITY): Payer: Self-pay | Admitting: Certified Registered Nurse Anesthetist

## 2016-09-21 ENCOUNTER — Encounter (HOSPITAL_COMMUNITY): Payer: Self-pay

## 2016-09-21 ENCOUNTER — Inpatient Hospital Stay (HOSPITAL_COMMUNITY)
Admission: EM | Admit: 2016-09-21 | Discharge: 2016-09-25 | DRG: 351 | Disposition: A | Discharge status: Home / Self Care | Payer: Medicare Other | Attending: Internal Medicine | Admitting: Internal Medicine

## 2016-09-21 DIAGNOSIS — E669 Obesity, unspecified: Secondary | ICD-10-CM | POA: Diagnosis present

## 2016-09-21 DIAGNOSIS — K631 Perforation of intestine (nontraumatic): Secondary | ICD-10-CM

## 2016-09-21 DIAGNOSIS — M810 Age-related osteoporosis without current pathological fracture: Secondary | ICD-10-CM | POA: Diagnosis not present

## 2016-09-21 DIAGNOSIS — Z86711 Personal history of pulmonary embolism: Secondary | ICD-10-CM | POA: Diagnosis not present

## 2016-09-21 DIAGNOSIS — I471 Supraventricular tachycardia: Secondary | ICD-10-CM | POA: Diagnosis not present

## 2016-09-21 DIAGNOSIS — N201 Calculus of ureter: Secondary | ICD-10-CM | POA: Diagnosis not present

## 2016-09-21 DIAGNOSIS — E785 Hyperlipidemia, unspecified: Secondary | ICD-10-CM | POA: Diagnosis not present

## 2016-09-21 DIAGNOSIS — Z86718 Personal history of other venous thrombosis and embolism: Secondary | ICD-10-CM | POA: Diagnosis not present

## 2016-09-21 DIAGNOSIS — K450 Other specified abdominal hernia with obstruction, without gangrene: Secondary | ICD-10-CM | POA: Diagnosis present

## 2016-09-21 DIAGNOSIS — Z9849 Cataract extraction status, unspecified eye: Secondary | ICD-10-CM | POA: Diagnosis not present

## 2016-09-21 DIAGNOSIS — G2581 Restless legs syndrome: Secondary | ICD-10-CM | POA: Diagnosis present

## 2016-09-21 DIAGNOSIS — Z888 Allergy status to other drugs, medicaments and biological substances status: Secondary | ICD-10-CM | POA: Diagnosis not present

## 2016-09-21 DIAGNOSIS — G4733 Obstructive sleep apnea (adult) (pediatric): Secondary | ICD-10-CM | POA: Diagnosis present

## 2016-09-21 DIAGNOSIS — E876 Hypokalemia: Secondary | ICD-10-CM | POA: Diagnosis present

## 2016-09-21 DIAGNOSIS — Z6832 Body mass index (BMI) 32.0-32.9, adult: Secondary | ICD-10-CM | POA: Diagnosis not present

## 2016-09-21 DIAGNOSIS — G8929 Other chronic pain: Secondary | ICD-10-CM | POA: Diagnosis present

## 2016-09-21 DIAGNOSIS — I480 Paroxysmal atrial fibrillation: Secondary | ICD-10-CM | POA: Diagnosis not present

## 2016-09-21 DIAGNOSIS — I959 Hypotension, unspecified: Secondary | ICD-10-CM | POA: Diagnosis not present

## 2016-09-21 DIAGNOSIS — N200 Calculus of kidney: Secondary | ICD-10-CM | POA: Diagnosis not present

## 2016-09-21 DIAGNOSIS — Z7901 Long term (current) use of anticoagulants: Secondary | ICD-10-CM

## 2016-09-21 DIAGNOSIS — N132 Hydronephrosis with renal and ureteral calculous obstruction: Secondary | ICD-10-CM | POA: Diagnosis present

## 2016-09-21 DIAGNOSIS — I119 Hypertensive heart disease without heart failure: Secondary | ICD-10-CM | POA: Diagnosis not present

## 2016-09-21 DIAGNOSIS — Z87891 Personal history of nicotine dependence: Secondary | ICD-10-CM

## 2016-09-21 DIAGNOSIS — I481 Persistent atrial fibrillation: Secondary | ICD-10-CM | POA: Diagnosis not present

## 2016-09-21 DIAGNOSIS — K4031 Unilateral inguinal hernia, with obstruction, without gangrene, recurrent: Secondary | ICD-10-CM | POA: Diagnosis not present

## 2016-09-21 DIAGNOSIS — F419 Anxiety disorder, unspecified: Secondary | ICD-10-CM | POA: Diagnosis present

## 2016-09-21 DIAGNOSIS — I10 Essential (primary) hypertension: Secondary | ICD-10-CM | POA: Diagnosis not present

## 2016-09-21 DIAGNOSIS — N400 Enlarged prostate without lower urinary tract symptoms: Secondary | ICD-10-CM | POA: Diagnosis present

## 2016-09-21 DIAGNOSIS — Z96652 Presence of left artificial knee joint: Secondary | ICD-10-CM | POA: Diagnosis present

## 2016-09-21 DIAGNOSIS — K4091 Unilateral inguinal hernia, without obstruction or gangrene, recurrent: Secondary | ICD-10-CM | POA: Diagnosis not present

## 2016-09-21 DIAGNOSIS — Z86010 Personal history of colonic polyps: Secondary | ICD-10-CM

## 2016-09-21 DIAGNOSIS — K4030 Unilateral inguinal hernia, with obstruction, without gangrene, not specified as recurrent: Secondary | ICD-10-CM | POA: Diagnosis not present

## 2016-09-21 DIAGNOSIS — I340 Nonrheumatic mitral (valve) insufficiency: Secondary | ICD-10-CM | POA: Diagnosis present

## 2016-09-21 DIAGNOSIS — Z9119 Patient's noncompliance with other medical treatment and regimen: Secondary | ICD-10-CM

## 2016-09-21 DIAGNOSIS — F329 Major depressive disorder, single episode, unspecified: Secondary | ICD-10-CM | POA: Diagnosis present

## 2016-09-21 DIAGNOSIS — R1030 Lower abdominal pain, unspecified: Secondary | ICD-10-CM | POA: Diagnosis not present

## 2016-09-21 DIAGNOSIS — K4090 Unilateral inguinal hernia, without obstruction or gangrene, not specified as recurrent: Secondary | ICD-10-CM | POA: Diagnosis not present

## 2016-09-21 LAB — COMPREHENSIVE METABOLIC PANEL
ALT: 18 U/L (ref 17–63)
AST: 23 U/L (ref 15–41)
Albumin: 5 g/dL (ref 3.5–5.0)
Alkaline Phosphatase: 75 U/L (ref 38–126)
Anion gap: 11 (ref 5–15)
BUN: 15 mg/dL (ref 6–20)
CO2: 28 mmol/L (ref 22–32)
Calcium: 9.5 mg/dL (ref 8.9–10.3)
Chloride: 98 mmol/L — ABNORMAL LOW (ref 101–111)
Creatinine, Ser: 0.86 mg/dL (ref 0.61–1.24)
GFR calc Af Amer: >60 mL/min (ref >60)
GFR calc non Af Amer: >60 mL/min (ref >60)
Glucose, Bld: 141 mg/dL — ABNORMAL HIGH (ref 65–99)
Potassium: 3.1 mmol/L — ABNORMAL LOW (ref 3.5–5.1)
Sodium: 137 mmol/L (ref 135–145)
Total Bilirubin: 0.8 mg/dL (ref 0.3–1.2)
Total Protein: 7.8 g/dL (ref 6.5–8.1)

## 2016-09-21 LAB — URINALYSIS, ROUTINE W REFLEX MICROSCOPIC
Bacteria, UA: NONE SEEN
Bilirubin Urine: NEGATIVE
Glucose, UA: NEGATIVE mg/dL
Ketones, ur: NEGATIVE mg/dL
Leukocytes, UA: NEGATIVE
Nitrite: NEGATIVE
Protein, ur: NEGATIVE mg/dL
Specific Gravity, Urine: 1.013 (ref 1.005–1.030)
Squamous Epithelial / HPF: NONE SEEN
pH: 7 (ref 5.0–8.0)

## 2016-09-21 LAB — CBC WITH DIFFERENTIAL/PLATELET
Basophils Absolute: 0 10*3/uL (ref 0.0–0.1)
Basophils Relative: 0 %
Eosinophils Absolute: 0.4 10*3/uL (ref 0.0–0.7)
Eosinophils Relative: 4 %
HCT: 40.6 % (ref 39.0–52.0)
Hemoglobin: 14.2 g/dL (ref 13.0–17.0)
Lymphocytes Relative: 14 %
Lymphs Abs: 1.4 10*3/uL (ref 0.7–4.0)
MCH: 30.2 pg (ref 26.0–34.0)
MCHC: 35 g/dL (ref 30.0–36.0)
MCV: 86.4 fL (ref 78.0–100.0)
Monocytes Absolute: 0.8 10*3/uL (ref 0.1–1.0)
Monocytes Relative: 8 %
Neutro Abs: 7.5 10*3/uL (ref 1.7–7.7)
Neutrophils Relative %: 74 %
Platelets: 173 10*3/uL (ref 150–400)
RBC: 4.7 MIL/uL (ref 4.22–5.81)
RDW: 13.3 % (ref 11.5–15.5)
WBC: 10.1 10*3/uL (ref 4.0–10.5)

## 2016-09-21 LAB — TYPE AND SCREEN
ABO/RH(D): O NEG
Antibody Screen: NEGATIVE

## 2016-09-21 LAB — PROTIME-INR
INR: 3.61
INR: 1.76
Prothrombin Time: 35.7 seconds — ABNORMAL HIGH (ref 11.4–15.2)
Prothrombin Time: 20.4 seconds — ABNORMAL HIGH (ref 11.4–15.2)

## 2016-09-21 LAB — LIPASE, BLOOD: Lipase: 53 U/L — ABNORMAL HIGH (ref 11–51)

## 2016-09-21 LAB — ABO/RH: ABO/RH(D): O NEG

## 2016-09-21 LAB — I-STAT CG4 LACTIC ACID, ED: Lactic Acid, Venous: 1.13 mmol/L (ref 0.5–1.9)

## 2016-09-21 SURGERY — REPAIR, HERNIA, INGUINAL, ADULT
Anesthesia: General | Laterality: Right

## 2016-09-21 MED ORDER — ACETAMINOPHEN 650 MG RE SUPP: 650.0000 mg | Freq: Four times a day (QID) | RECTAL | Status: DC | PRN

## 2016-09-21 MED ORDER — ONDANSETRON HCL 4 MG/2ML IJ SOLN
4.0000 mg | Freq: Four times a day (QID) | INTRAMUSCULAR | Status: DC | PRN
  Administered 2016-09-22 – 2016-09-25 (×2): 4 mg via INTRAVENOUS
  Filled 2016-09-21: qty 2

## 2016-09-21 MED ORDER — HYDROMORPHONE HCL 1 MG/ML IJ SOLN
1.0000 mg | Freq: Once | INTRAMUSCULAR | Status: AC
  Administered 2016-09-21: 1 mg via INTRAVENOUS
  Filled 2016-09-21: qty 1

## 2016-09-21 MED ORDER — LIDOCAINE 2% (20 MG/ML) 5 ML SYRINGE
INTRAMUSCULAR | Status: AC
  Filled 2016-09-21: qty 5

## 2016-09-21 MED ORDER — SODIUM CHLORIDE 0.9 % IV BOLUS (SEPSIS)
1000.0000 mL | Freq: Once | INTRAVENOUS | Status: AC
  Administered 2016-09-21: 1000 mL via INTRAVENOUS

## 2016-09-21 MED ORDER — PROTHROMBIN COMPLEX CONC HUMAN 500 UNITS IV KIT: 2500.0000 [IU] | PACK | INTRAVENOUS | Status: DC

## 2016-09-21 MED ORDER — DIAZEPAM 5 MG/ML IJ SOLN
INTRAMUSCULAR | Status: AC
  Filled 2016-09-21: qty 2

## 2016-09-21 MED ORDER — FENTANYL CITRATE (PF) 250 MCG/5ML IJ SOLN
INTRAMUSCULAR | Status: AC
  Filled 2016-09-21: qty 5

## 2016-09-21 MED ORDER — ONDANSETRON HCL 4 MG/2ML IJ SOLN
INTRAMUSCULAR | Status: AC
  Filled 2016-09-21: qty 2

## 2016-09-21 MED ORDER — PROPOFOL 10 MG/ML IV BOLUS
INTRAVENOUS | Status: AC
  Filled 2016-09-21: qty 20

## 2016-09-21 MED ORDER — DEXTROSE IN LACTATED RINGERS 5 % IV SOLN
INTRAVENOUS | Status: DC
  Administered 2016-09-22 – 2016-09-24 (×5): via INTRAVENOUS

## 2016-09-21 MED ORDER — HYDROMORPHONE HCL 1 MG/ML IJ SOLN
0.5000 mg | Freq: Once | INTRAMUSCULAR | Status: AC
  Administered 2016-09-21: 0.5 mg via INTRAVENOUS

## 2016-09-21 MED ORDER — HYDRALAZINE HCL 20 MG/ML IJ SOLN
10.0000 mg | INTRAMUSCULAR | Status: DC | PRN
  Administered 2016-09-24: 10 mg via INTRAVENOUS
  Filled 2016-09-21: qty 1

## 2016-09-21 MED ORDER — DIAZEPAM 5 MG/ML IJ SOLN
2.0000 mg | Freq: Once | INTRAMUSCULAR | Status: AC
  Administered 2016-09-21: 2 mg via INTRAVENOUS

## 2016-09-21 MED ORDER — SUGAMMADEX SODIUM 200 MG/2ML IV SOLN
INTRAVENOUS | Status: AC
  Filled 2016-09-21: qty 2

## 2016-09-21 MED ORDER — SODIUM CHLORIDE 0.9 % IV SOLN: Freq: Once | INTRAVENOUS | Status: DC

## 2016-09-21 MED ORDER — SUCCINYLCHOLINE CHLORIDE 200 MG/10ML IV SOSY
PREFILLED_SYRINGE | INTRAVENOUS | Status: AC
Start: 2016-09-21 — End: 2016-09-21
  Filled 2016-09-21: qty 10

## 2016-09-21 MED ORDER — TAMSULOSIN HCL 0.4 MG PO CAPS
0.4000 mg | ORAL_CAPSULE | Freq: Two times a day (BID) | ORAL | Status: DC
  Administered 2016-09-22 – 2016-09-25 (×6): 0.4 mg via ORAL
  Filled 2016-09-21 (×6): qty 1

## 2016-09-21 MED ORDER — VITAMIN K1 10 MG/ML IJ SOLN
5.0000 mg | Freq: Once | INTRAVENOUS | Status: DC
  Filled 2016-09-21: qty 0.5

## 2016-09-21 MED ORDER — KCL IN DEXTROSE-NACL 20-5-0.45 MEQ/L-%-% IV SOLN: INTRAVENOUS | Status: DC

## 2016-09-21 MED ORDER — SODIUM CHLORIDE 0.9 % IV SOLN
Freq: Once | INTRAVENOUS | Status: AC
  Administered 2016-09-21: 18:00:00 via INTRAVENOUS

## 2016-09-21 MED ORDER — SUGAMMADEX SODIUM 500 MG/5ML IV SOLN
INTRAVENOUS | Status: AC
Start: 2016-09-21 — End: 2016-09-21
  Filled 2016-09-21: qty 5

## 2016-09-21 MED ORDER — ACETAMINOPHEN 325 MG PO TABS: 650.0000 mg | ORAL_TABLET | Freq: Four times a day (QID) | ORAL | Status: DC | PRN

## 2016-09-21 MED ORDER — CARVEDILOL 12.5 MG PO TABS
12.5000 mg | ORAL_TABLET | Freq: Two times a day (BID) | ORAL | Status: DC
  Administered 2016-09-23 (×2): 12.5 mg via ORAL
  Filled 2016-09-21 (×2): qty 1

## 2016-09-21 MED ORDER — VITAMIN K1 10 MG/ML IJ SOLN
10.0000 mg | INTRAVENOUS | Status: AC
  Administered 2016-09-21: 10 mg via INTRAVENOUS
  Filled 2016-09-21: qty 1

## 2016-09-21 MED ORDER — HYDROMORPHONE HCL 1 MG/ML IJ SOLN
INTRAMUSCULAR | Status: AC
  Filled 2016-09-21: qty 1

## 2016-09-21 MED ORDER — ROCURONIUM BROMIDE 50 MG/5ML IV SOSY
PREFILLED_SYRINGE | INTRAVENOUS | Status: AC
  Filled 2016-09-21: qty 5

## 2016-09-21 MED ORDER — ONDANSETRON HCL 4 MG PO TABS
4.0000 mg | ORAL_TABLET | Freq: Four times a day (QID) | ORAL | Status: DC | PRN
  Filled 2016-09-21: qty 1

## 2016-09-21 MED ORDER — ONDANSETRON HCL 4 MG/2ML IJ SOLN
4.0000 mg | Freq: Once | INTRAMUSCULAR | Status: AC
  Administered 2016-09-21: 4 mg via INTRAVENOUS
  Filled 2016-09-21: qty 2

## 2016-09-21 MED ORDER — ESCITALOPRAM OXALATE 10 MG PO TABS: 20.0000 mg | ORAL_TABLET | Freq: Every day | ORAL | Status: DC

## 2016-09-21 MED ORDER — HYDROMORPHONE HCL-NACL 0.5-0.9 MG/ML-% IV SOSY
0.5000 mg | PREFILLED_SYRINGE | INTRAVENOUS | Status: DC | PRN
  Administered 2016-09-22 – 2016-09-23 (×6): 0.5 mg via INTRAVENOUS
  Filled 2016-09-21 (×6): qty 1

## 2016-09-21 NOTE — ED Notes (Signed)
Patient transported to CT 

## 2016-09-21 NOTE — ED Notes (Signed)
Rosenbower MD at bedside to attempt inguinal hernia reduction

## 2016-09-21 NOTE — Consult Note (Signed)
Reason for Consult: non reducible hernia Referring Physician: Robyn Haber CC:  RLQ pain  Brandon [name] [name] is an 66 y.o. male.  HPI: Pt went to [facility] for RLQ pain and know kidney stones.  They were concerned he had an incarcertated hernia and sent to the ED.  Work up in the ED shows: afebrile, but hypertensive.  K+ 3.1, lactate 1.13. INR 3.61.  CT scan 09/19/16 shows 4 x 2 mm RIGHT mid ureteral calculus resulting in mild obstructive uropathy. Residual RIGHT nephrolithiasis measuring to 3 Mm.   Colonic diverticulosis without acute diverticulitis.Aortic Atherosclerosis .  No hernia seen Evaluation in the ED shows a non reducible RIH.  We will start the reversal of the INR.  We will have Medicine admit.   . Past Medical History:  Diagnosis Date  . Anxiety   . Arthritis   . Benign neoplasm of colon   . Benign prostatic hyperplasia with urinary obstruction   . Bilateral pulmonary embolism ([place]) 3/14   admitted to [facility],  treated with Xarelto  . Cataract   . Chronic pain   . History of pulmonary embolism 03/19/2012  . Hyperlipemia   . Hypertension   . Insomnia   . Major depressive disorder, recurrent episode ([place])   . Mitral regurgitation 04/24/2013   Mild by TEE  . Obesity   . OSA (obstructive sleep apnea)    noncompliant with CPAP.  07/27/13- awaiting a CPAP,  . Osteoporosis   . Persistent atrial fibrillation ([place])   . Restless leg syndrome    takes gabapentin  . Sciatica   . SVT (supraventricular tachycardia) ([place]) 03/07/2016  . Thrombus of left atrial appendage 03/09/2013  . Ventral hernia, unspecified, without mention of obstruction or gangrene    right abdominal wall    Past Surgical History:  Procedure Laterality Date  . CARDIOVERSION N/A 03/21/2012   Procedure: CARDIOVERSION;  Surgeon: Birdie Riddle, MD;  Location: [facility] ENDOSCOPY;  Service: Cardiovascular;  Laterality: N/A;  . CARDIOVERSION N/A 04/24/2013   Procedure: CARDIOVERSION;  Surgeon: Dorothy Spark,  MD;  Location: [facility] ENDOSCOPY;  Service: Cardiovascular;  Laterality: N/A;  . CARDIOVERSION N/A 06/27/2015   Procedure: CARDIOVERSION;  Surgeon: Larey Dresser, MD;  Location: [place];  Service: Cardiovascular;  Laterality: N/A;  . CATARACT EXTRACTION    . COLONOSCOPY W/ POLYPECTOMY    . ELECTROPHYSIOLOGIC STUDY N/A 07/30/2015   Procedure: Atrial Fibrillation Ablation;  Surgeon: Thompson Grayer, MD;  Location: [place] CV LAB;  Service: Cardiovascular;  Laterality: N/A;  . EYE SURGERY Right    cataract  . FEMUR FRACTURE SURGERY    . HERNIA REPAIR  07/06/2011  . INGUINAL HERNIA REPAIR Right 07/28/2013   Procedure: RIGHT INGUINAL HERNIA REPAIR;  Surgeon: Imogene Burn. Georgette Dover, MD;  Location: [place];  Service: General;  Laterality: Right;  . INSERTION OF MESH Right 07/28/2013   Procedure: INSERTION OF MESH;  Surgeon: Imogene Burn. Georgette Dover, MD;  Location: [place];  Service: General;  Laterality: Right;  . KNEE ARTHROSCOPY     left  . REPLACEMENT TOTAL KNEE Left   . ROTATOR CUFF REPAIR     right  . ROTATOR CUFF REPAIR Left 03/08/2014   DR SUPPLE  . SHOULDER ARTHROSCOPY WITH ROTATOR CUFF REPAIR AND SUBACROMIAL DECOMPRESSION Left 03/08/2014   Procedure: LEFT SHOULDER ARTHROSCOPY WITH ROTATOR CUFF REPAIR/SUBACROMIAL DECOMPRESSION/DISTAL CLAVICLE RESECTION;  Surgeon: Marin Shutter, MD;  Location: [place];  Service: Orthopedics;  Laterality: Left;  . TEE WITHOUT CARDIOVERSION N/A  03/21/2012   Procedure: TRANSESOPHAGEAL ECHOCARDIOGRAM (TEE);  Surgeon: Birdie Riddle, MD;  Location: [place];  Service: Cardiovascular;  Laterality: N/A;  . TEE WITHOUT CARDIOVERSION N/A 04/24/2013   Procedure: TRANSESOPHAGEAL ECHOCARDIOGRAM (TEE);  Surgeon: Dorothy Spark, MD;  Location: [place];  Service: Cardiovascular;  Laterality: N/A;  . TEE WITHOUT CARDIOVERSION N/A 07/29/2015   Procedure: TRANSESOPHAGEAL ECHOCARDIOGRAM (TEE);  Surgeon: Fay Records, MD;  Location: [facility] ENDOSCOPY;  Service: Cardiovascular;  Laterality:  N/A;  . TONSILLECTOMY      Family History  Problem Relation Age of Onset  . Cancer Father        bone  . Cancer Paternal Grandmother        ovarian  . CVA Other        Fam Hx of multiple myeloma  . Diabetes Other        Fam Hx of DM    Social History:  reports that he has quit smoking. He has never used smokeless tobacco. He reports that he drinks about 3.6 oz of alcohol per week . He reports that he does not use drugs. Lives alone  Tobacco:  none Allergies:  Allergies  Allergen Reactions  . Ace Inhibitors Cough  . Albuterol Other (See Comments)    Racing heart    Prior to Admission medications   Medication Sig Start Date End Date Taking? Authorizing Provider  acetaminophen (TYLENOL) 500 MG tablet Take 1,500 mg by mouth 3 (three) times daily with meals.     [provider]  amLODipine (NORVASC) 10 MG tablet Take 10 mg by mouth daily.    [provider]  carvedilol (COREG) 12.5 MG tablet Take 1 tablet (12.5 mg total) by mouth 2 (two) times daily. 03/09/16   Baldwin Jamaica, PA-C  escitalopram (LEXAPRO) 20 MG tablet Take 20 mg by mouth daily.    [provider]  gabapentin (NEURONTIN) 800 MG tablet TAKE 1 TABLET EVERY DAY WITH DINNER AND AT BEDTIME AS NEEDED Patient taking differently: TAKE 837m by mouth WITH DINNER AND AT BEDTIME AS NEEDED 08/20/16   Sater, RNanine Means MD  losartan (COZAAR) 100 MG tablet Take 1 tablet (100 mg total) by mouth daily. 03/30/16   Allred, JJeneen Rinks MD  ondansetron (ZOFRAN ODT) 8 MG disintegrating tablet Take 1 tablet (8 mg total) by mouth every 8 (eight) hours as needed for nausea or vomiting. 09/19/16   CJola Schmidt MD  oxyCODONE-acetaminophen (PERCOCET/ROXICET) 5-325 MG tablet Take 1 tablet by mouth every 4 (four) hours as needed for severe pain. 09/19/16   CJola Schmidt MD  predniSONE (DELTASONE) 5 MG tablet Take 5-30 mg by mouth See admin instructions. Take 6,5,4,3,2,1 tablet until done 09/17/16   [provider]   spironolactone (ALDACTONE) 25 MG tablet Take 0.5 tablets (12.5 mg total) by mouth daily. Patient taking differently: Take 25 mg by mouth daily.  07/28/16   Allred, JJeneen Rinks MD  tamsulosin (FLOMAX) 0.4 MG CAPS capsule Take 0.4 mg by mouth 2 (two) times daily.     [provider]  warfarin (COUMADIN) 5 MG tablet TAKE 1 TABLET AS DIRECTED BY COUMADIN CLINIC Patient taking differently: TAKE 1 TABLET (588m all days on Mon and Thurs take 1 1/2 tablet (7.47m44m8/16/18   Allred, JamJeneen RinksD     (Not in a hospital admission)  Results for orders placed or performed during the hospital encounter of 09/21/16 (from the past 48 hour(s))  Urinalysis, Routine w reflex microscopic     Status: Abnormal  Collection Time: 09/21/16  2:27 PM  Result Value Ref Range   Color, Urine YELLOW YELLOW   APPearance CLEAR CLEAR   Specific Gravity, Urine 1.013 1.005 - 1.030   pH 7.0 5.0 - 8.0   Glucose, UA NEGATIVE NEGATIVE mg/dL   Hgb urine dipstick MODERATE (A) NEGATIVE   Bilirubin Urine NEGATIVE NEGATIVE   Ketones, ur NEGATIVE NEGATIVE mg/dL   Protein, ur NEGATIVE NEGATIVE mg/dL   Nitrite NEGATIVE NEGATIVE   Leukocytes, UA NEGATIVE NEGATIVE   RBC / HPF 6-30 0 - 5 RBC/hpf   WBC, UA 0-5 0 - 5 WBC/hpf   Bacteria, UA NONE SEEN NONE SEEN   Squamous Epithelial / LPF NONE SEEN NONE SEEN   Mucus PRESENT   Comprehensive metabolic panel     Status: Abnormal   Collection Time: 09/21/16  2:36 PM  Result Value Ref Range   Sodium 137 135 - 145 mmol/L   Potassium 3.1 (L) 3.5 - 5.1 mmol/L   Chloride 98 (L) 101 - 111 mmol/L   CO2 28 22 - 32 mmol/L   Glucose, Bld 141 (H) 65 - 99 mg/dL   BUN 15 6 - 20 mg/dL   Creatinine, Ser 0.86 0.61 - 1.24 mg/dL   Calcium 9.5 8.9 - 10.3 mg/dL   Total Protein 7.8 6.5 - 8.1 g/dL   Albumin 5.0 3.5 - 5.0 g/dL   AST 23 15 - 41 U/L   ALT 18 17 - 63 U/L   Alkaline Phosphatase 75 38 - 126 U/L   Total Bilirubin 0.8 0.3 - 1.2 mg/dL   GFR calc non Af Amer >60 >60 mL/min   GFR calc Af  Amer >60 >60 mL/min    Comment: (NOTE) The eGFR has been calculated using the CKD EPI equation. This calculation has not been validated in all clinical situations. eGFR's persistently <60 mL/min signify possible Chronic Kidney Disease.    Anion gap 11 5 - 15  CBC with Differential     Status: None   Collection Time: 09/21/16  2:36 PM  Result Value Ref Range   WBC 10.1 4.0 - 10.5 K/uL   RBC 4.70 4.22 - 5.81 MIL/uL   Hemoglobin 14.2 13.0 - 17.0 g/dL   HCT 40.6 39.0 - 52.0 %   MCV 86.4 78.0 - 100.0 fL   MCH 30.2 26.0 - 34.0 pg   MCHC 35.0 30.0 - 36.0 g/dL   RDW 13.3 11.5 - 15.5 %   Platelets 173 150 - 400 K/uL   Neutrophils Relative % 74 %   Neutro Abs 7.5 1.7 - 7.7 K/uL   Lymphocytes Relative 14 %   Lymphs Abs 1.4 0.7 - 4.0 K/uL   Monocytes Relative 8 %   Monocytes Absolute 0.8 0.1 - 1.0 K/uL   Eosinophils Relative 4 %   Eosinophils Absolute 0.4 0.0 - 0.7 K/uL   Basophils Relative 0 %   Basophils Absolute 0.0 0.0 - 0.1 K/uL  Lipase, blood     Status: Abnormal   Collection Time: 09/21/16  2:36 PM  Result Value Ref Range   Lipase 53 (H) 11 - 51 U/L  Protime-INR     Status: Abnormal   Collection Time: 09/21/16  2:36 PM  Result Value Ref Range   Prothrombin Time 35.7 (H) 11.4 - 15.2 seconds   INR 3.61   I-Stat CG4 Lactic Acid, ED     Status: None   Collection Time: 09/21/16  2:46 PM  Result Value Ref Range   Lactic Acid, Venous  1.13 0.5 - 1.9 mmol/L    No results found.  Review of Systems  Constitutional: Negative for chills, diaphoresis, fever, malaise/fatigue and weight loss.  HENT: Negative.   Eyes: Negative.   Respiratory: Negative.   Cardiovascular: Positive for palpitations (in and out of AF). Negative for chest pain.  Gastrointestinal: Positive for abdominal pain (pain RLQ). Negative for blood in stool, constipation, diarrhea, heartburn, melena, nausea and vomiting.  Genitourinary: Positive for hematuria.  Musculoskeletal: Positive for back pain and joint pain.   Skin: Positive for rash (mid abdomen).  Neurological: Negative.  Negative for weakness.  Endo/Heme/Allergies: Bruises/bleeds easily (on coumadin).  Psychiatric/Behavioral: Positive for depression.   Blood pressure (!) 173/110, pulse 75, temperature (!) 97.4 F (36.3 C), temperature source Oral, resp. rate 20, height 6' 4"  (1.93 m), weight 122.5 kg (270 lb), SpO2 98 %. Physical Exam  Constitutional: He is oriented to person, place, and time. He appears well-developed and well-nourished. He appears distressed (He is having a great deal of pain and is somewhat confused.).  HENT:  Head: Normocephalic and atraumatic.  Mouth/Throat: No oropharyngeal exudate.  Eyes: Right eye exhibits no discharge. Left eye exhibits no discharge. No scleral icterus.  Pupils are equal;  Neck: Normal range of motion. Neck supple. No JVD present. No tracheal deviation present. No thyromegaly present.  Cardiovascular: Normal rate, normal heart sounds and intact distal pulses.   Respiratory: Effort normal and breath sounds normal. No respiratory distress. He has no wheezes. He has no rales.  GI: Soft. Bowel sounds are normal. He exhibits no distension and no mass. There is tenderness (RLQ hernia that is non reducible and extrememly painful.). There is no rebound and no guarding.  Musculoskeletal: He exhibits no edema or tenderness.  Lymphadenopathy:    He has no cervical adenopathy.  Neurological: He is alert and oriented to person, place, and time. No cranial nerve deficit.  He is a little confused.  Skin: Skin is warm and dry. Rash (rash mid abdomen) noted. No erythema.  Psychiatric: He has a normal mood and affect. His behavior is normal. Judgment and thought content normal.    Assessment/Plan: Incarcerated RIH; prior hernia repair with mesh 07/28/13 dr. Donnie Mesa AF and Hx of PE on coumadin  INR 3.6 - Dr. Doylene Canard Nephrolithiais Hx of femur repair OSA RLS   Plan:  Pt has intractable pain, we cannot  reduce the hernia.  We are switching to K-centra instead of FFP.  We will take to the OR ASAP.   Brandon [name] 09/21/2016, 3:49 PM

## 2016-09-21 NOTE — ED Notes (Signed)
GI team at bedside.

## 2016-09-21 NOTE — ED Provider Notes (Signed)
[facility] DEPT Provider Note   CSN: [id number] Arrival date & time: 09/21/16  1357     History   Chief Complaint Chief Complaint  Patient presents with  . Abdominal Pain  . Nausea    HPI Brandon [name]. is a 66 y.o. male with history of inguinal hernia, recent diagnosis of she was stone who presents with right groin pain. Patient went to [facility] this morning to evaluate his kidney stone that was diagnosed on CT on 09/19/16. He has had progressive pain since onset that began around 40 minutes prior to arrival. His pain is severe. He has had associated nausea. Patient was given 8 mg of morphine IM at [facility] prior to transport. Patient denies any fevers, chest pain, shortness of breath, urinary symptoms. Patient has had to push the hernia back in the past with success. He has never had a problem to this degree with his hernia.  HPI  Past Medical History:  Diagnosis Date  . Anxiety   . Arthritis   . Benign neoplasm of colon   . Benign prostatic hyperplasia with urinary obstruction   . Bilateral pulmonary embolism ([place]) 3/14   admitted to [facility],  treated with Xarelto  . Cataract   . Chronic pain   . History of pulmonary embolism 03/19/2012  . Hyperlipemia   . Hypertension   . Insomnia   . Major depressive disorder, recurrent episode ([place])   . Mitral regurgitation 04/24/2013   Mild by TEE  . Obesity   . OSA (obstructive sleep apnea)    noncompliant with CPAP.  07/27/13- awaiting a CPAP,  . Osteoporosis   . Persistent atrial fibrillation ([place])   . Restless leg syndrome    takes gabapentin  . Sciatica   . SVT (supraventricular tachycardia) ([place]) 03/07/2016  . Thrombus of left atrial appendage 03/09/2013  . Ventral hernia, unspecified, without mention of obstruction or gangrene    right abdominal wall    Patient Active Problem List   Diagnosis Date Noted  . Incarcerated hernia of abdominal cavity 09/21/2016  . SVT (supraventricular tachycardia)  ([place]) 03/07/2016  . Obesity (BMI 30-39.9) 03/07/2016  . Alcohol abuse 03/07/2016  . Demand ischemia of myocardium ([place]) 03/07/2016  . Paroxysmal atrial fibrillation ([facility])   . Restless leg syndrome 02/08/2014  . Current use of long term anticoagulation   . Obstructive sleep apnea 03/09/2013  . Hyperlipidemia 03/21/2012  . History of pulmonary embolism 03/19/2012  . Asthma, exogenous 06/20/2008  . Hypertensive heart disease     Past Surgical History:  Procedure Laterality Date  . CARDIOVERSION N/A 03/21/2012   Procedure: CARDIOVERSION;  Surgeon: Birdie Riddle, MD;  Location: [facility] ENDOSCOPY;  Service: Cardiovascular;  Laterality: N/A;  . CARDIOVERSION N/A 04/24/2013   Procedure: CARDIOVERSION;  Surgeon: Dorothy Spark, MD;  Location: [facility] ENDOSCOPY;  Service: Cardiovascular;  Laterality: N/A;  . CARDIOVERSION N/A 06/27/2015   Procedure: CARDIOVERSION;  Surgeon: Larey Dresser, MD;  Location: [facility];  Service: Cardiovascular;  Laterality: N/A;  . CATARACT EXTRACTION    . COLONOSCOPY W/ POLYPECTOMY    . ELECTROPHYSIOLOGIC STUDY N/A 07/30/2015   Procedure: Atrial Fibrillation Ablation;  Surgeon: Thompson Grayer, MD;  Location: [facility] CV LAB;  Service: Cardiovascular;  Laterality: N/A;  . EYE SURGERY Right    cataract  . FEMUR FRACTURE SURGERY    . HERNIA REPAIR  07/06/2011  . INGUINAL HERNIA REPAIR Right 07/28/2013   Procedure: RIGHT INGUINAL HERNIA REPAIR;  Surgeon: Imogene Burn. Tsuei,  MD;  Location: [facility];  Service: General;  Laterality: Right;  . INSERTION OF MESH Right 07/28/2013   Procedure: INSERTION OF MESH;  Surgeon: Imogene Burn. Georgette Dover, MD;  Location: [facility];  Service: General;  Laterality: Right;  . KNEE ARTHROSCOPY     left  . REPLACEMENT TOTAL KNEE Left   . ROTATOR CUFF REPAIR     right  . ROTATOR CUFF REPAIR Left 03/08/2014   DR SUPPLE  . SHOULDER ARTHROSCOPY WITH ROTATOR CUFF REPAIR AND SUBACROMIAL DECOMPRESSION Left 03/08/2014   Procedure: LEFT SHOULDER ARTHROSCOPY WITH  ROTATOR CUFF REPAIR/SUBACROMIAL DECOMPRESSION/DISTAL CLAVICLE RESECTION;  Surgeon: Marin Shutter, MD;  Location: [facility];  Service: Orthopedics;  Laterality: Left;  . TEE WITHOUT CARDIOVERSION N/A 03/21/2012   Procedure: TRANSESOPHAGEAL ECHOCARDIOGRAM (TEE);  Surgeon: Birdie Riddle, MD;  Location: [facility];  Service: Cardiovascular;  Laterality: N/A;  . TEE WITHOUT CARDIOVERSION N/A 04/24/2013   Procedure: TRANSESOPHAGEAL ECHOCARDIOGRAM (TEE);  Surgeon: Dorothy Spark, MD;  Location: [facility];  Service: Cardiovascular;  Laterality: N/A;  . TEE WITHOUT CARDIOVERSION N/A 07/29/2015   Procedure: TRANSESOPHAGEAL ECHOCARDIOGRAM (TEE);  Surgeon: Fay Records, MD;  Location: [facility] ENDOSCOPY;  Service: Cardiovascular;  Laterality: N/A;  . TONSILLECTOMY         Home Medications    Prior to Admission medications   Medication Sig Start Date End Date Taking? Authorizing Provider  carvedilol (COREG) 12.5 MG tablet Take 1 tablet (12.5 mg total) by mouth 2 (two) times daily. 03/09/16  Yes Baldwin Jamaica, PA-C  acetaminophen (TYLENOL) 500 MG tablet Take 1,500 mg by mouth 3 (three) times daily with meals.     [provider]  amLODipine (NORVASC) 10 MG tablet Take 10 mg by mouth daily.    [provider]  escitalopram (LEXAPRO) 20 MG tablet Take 20 mg by mouth daily.    [provider]  gabapentin (NEURONTIN) 800 MG tablet TAKE 1 TABLET EVERY DAY WITH DINNER AND AT BEDTIME AS NEEDED Patient taking differently: TAKE 864m by mouth WITH DINNER AND AT BEDTIME AS NEEDED 08/20/16   Sater, RNanine Means MD  losartan (COZAAR) 100 MG tablet Take 1 tablet (100 mg total) by mouth daily. 03/30/16   Allred, JJeneen Rinks MD  ondansetron (ZOFRAN ODT) 8 MG disintegrating tablet Take 1 tablet (8 mg total) by mouth every 8 (eight) hours as needed for nausea or vomiting. 09/19/16   CJola Schmidt MD  oxyCODONE-acetaminophen (PERCOCET/ROXICET) 5-325 MG tablet Take 1 tablet by mouth every 4 (four) hours as  needed for severe pain. 09/19/16   CJola Schmidt MD  predniSONE (DELTASONE) 5 MG tablet Take 5-30 mg by mouth See admin instructions. Take 6,5,4,3,2,1 tablet until done 09/17/16   [provider]  spironolactone (ALDACTONE) 25 MG tablet Take 0.5 tablets (12.5 mg total) by mouth daily. Patient taking differently: Take 25 mg by mouth daily.  07/28/16   Allred, JJeneen Rinks MD  tamsulosin (FLOMAX) 0.4 MG CAPS capsule Take 0.4 mg by mouth 2 (two) times daily.     [provider]  warfarin (COUMADIN) 5 MG tablet TAKE 1 TABLET AS DIRECTED BY COUMADIN CLINIC Patient taking differently: TAKE 1 TABLET (5771m all days on Mon and Thurs take 1 1/2 tablet (7.71m4m8/16/18   AllThompson GrayerD    Family History Family History  Problem Relation Age of Onset  . Cancer Father        bone  . Cancer Paternal Grandmother        ovarian  . CVA  Other        Fam Hx of multiple myeloma  . Diabetes Other        Fam Hx of DM    Social History Social History  Substance Use Topics  . Smoking status: Former Research scientist (life sciences)  . Smokeless tobacco: Never Used  . Alcohol use 3.6 oz/week    6 Shots of liquor per week     Comment: 2 drinks per night     Allergies   Ace inhibitors and Albuterol   Review of Systems Review of Systems  Constitutional: Negative for chills and fever.  HENT: Negative for facial swelling and sore throat.   Respiratory: Negative for shortness of breath.   Cardiovascular: Negative for chest pain.  Gastrointestinal: Positive for abdominal pain. Negative for nausea and vomiting.  Genitourinary: Negative for dysuria.  Musculoskeletal: Negative for back pain.  Skin: Negative for rash and wound.  Neurological: Negative for headaches.  Psychiatric/Behavioral: The patient is not nervous/anxious.      Physical Exam Updated Vital Signs BP (!) 141/69 (BP Location: Right Arm)   Pulse (!) 42   Temp (!) 97.4 F (36.3 C) (Oral)   Resp 18   Ht _0  (1.93 m)   Wt 122.5 kg (270 lb)    SpO2 93%   BMI 32.87 kg/m   Physical Exam  Constitutional: He appears well-developed and well-nourished. No distress.  HENT:  Head: Normocephalic and atraumatic.  Mouth/Throat: Oropharynx is clear and moist. No oropharyngeal exudate.  Eyes: Pupils are equal, round, and reactive to light. Conjunctivae are normal. Right eye exhibits no discharge. Left eye exhibits no discharge. No scleral icterus.  Neck: Normal range of motion. Neck supple. No thyromegaly present.  Cardiovascular: Normal rate, regular rhythm, normal heart sounds and intact distal pulses.  Exam reveals no gallop and no friction rub.   No murmur heard. Pulmonary/Chest: Effort normal and breath sounds normal. No stridor. No respiratory distress. He has no wheezes. He has no rales.  Abdominal: Soft. Bowel sounds are normal. He exhibits no distension. There is no tenderness. There is no rebound and no guarding. A hernia is present. Hernia confirmed positive in the right inguinal area.    Musculoskeletal: He exhibits no edema.  Lymphadenopathy:    He has no cervical adenopathy.  Neurological: He is alert. Coordination normal.  Skin: Skin is warm and dry. No rash noted. He is not diaphoretic. No pallor.  Psychiatric: He has a normal mood and affect.  Nursing note and vitals reviewed.    ED Treatments / Results  Labs (all labs ordered are listed, but only abnormal results are displayed) Labs Reviewed  COMPREHENSIVE METABOLIC PANEL - Abnormal; Notable for the following:       Result Value   Potassium 3.1 (*)    Chloride 98 (*)    Glucose, Bld 141 (*)    All other components within normal limits  URINALYSIS, ROUTINE W REFLEX MICROSCOPIC - Abnormal; Notable for the following:    Hgb urine dipstick MODERATE (*)    All other components within normal limits  LIPASE, BLOOD - Abnormal; Notable for the following:    Lipase 53 (*)    All other components within normal limits  PROTIME-INR - Abnormal; Notable for the following:     Prothrombin Time 35.7 (*)    All other components within normal limits  CBC WITH DIFFERENTIAL/PLATELET  COMPREHENSIVE METABOLIC PANEL  CBC  PROTIME-INR  MAGNESIUM  I-STAT CG4 LACTIC ACID, ED  TYPE AND SCREEN  PREPARE  FRESH FROZEN PLASMA  ABO/RH    EKG  EKG Interpretation None       Radiology No results found.  Procedures Procedures (including critical care time)  Medications Ordered in ED Medications  dextrose 5 % in lactated ringers infusion (not administered)  0.9 %  sodium chloride infusion (not administered)  carvedilol (COREG) tablet 12.5 mg (not administered)  escitalopram (LEXAPRO) tablet 20 mg (not administered)  tamsulosin (FLOMAX) capsule 0.4 mg (not administered)  acetaminophen (TYLENOL) tablet 650 mg (not administered)    Or  acetaminophen (TYLENOL) suppository 650 mg (not administered)  ondansetron (ZOFRAN) tablet 4 mg (not administered)    Or  ondansetron (ZOFRAN) injection 4 mg (not administered)  hydrALAZINE (APRESOLINE) injection 10 mg (not administered)  diazepam (VALIUM) 5 MG/ML injection (not administered)  HYDROmorphone (DILAUDID) 1 MG/ML injection (not administered)  HYDROmorphone (DILAUDID) injection 1 mg (1 mg Intravenous Given 09/21/16 1429)  ondansetron (ZOFRAN) injection 4 mg (4 mg Intravenous Given 09/21/16 1429)  HYDROmorphone (DILAUDID) injection 1 mg (1 mg Intravenous Given 09/21/16 1450)  HYDROmorphone (DILAUDID) injection 1 mg (1 mg Intravenous Given 09/21/16 1640)  sodium chloride 0.9 % bolus 1,000 mL (1,000 mLs Intravenous New Bag/Given 09/21/16 1640)  0.9 %  sodium chloride infusion ( Intravenous New Bag/Given 09/21/16 1747)  phytonadione (VITAMIN K) 10 mg in dextrose 5 % 50 mL IVPB (0 mg Intravenous Stopped 09/21/16 1747)  diazepam (VALIUM) injection 2 mg (2 mg Intravenous Given 09/21/16 1746)  HYDROmorphone (DILAUDID) injection 0.5 mg (0.5 mg Intravenous Given 09/21/16 1747)     Initial Impression / Assessment and Plan / ED Course    I have reviewed the triage vital signs and the nursing notes.  Pertinent labs & imaging results that were available during my care of the patient were reviewed by me and considered in my medical decision making (see chart for details).     CHA2DS2/VAS Stroke Risk 4   Patient with an inguinal hernia, nonreducible in the ED. Patients pain moderately controlled with Dilaudid in the ED.  CBC unremarkable. CMP shows potassium 3.1, chloride 98, glucose 141. Lactate 1.13. Lipase 53. INR 3.61. UA shows moderate hematuria. Patient does have known ureterolithiasis. Patient also evaluated by Dr. Alvino Chapel who assisted in attempts at reduction of hernia and was also unsuccessful. I consulted general surgery and spoke with Modena Jansky, [place] who will evaluate the patient. He advised medicine admission. I spoke with Dr. Posey Pronto with [facility] who will admit the patient.   Final Clinical Impressions(s) / ED Diagnoses   Final diagnoses:  Incarcerated inguinal hernia    New Prescriptions New Prescriptions   No medications on file       Caryl Ada 09/21/16 1833    Davonna Belling, MD 09/22/16 [phone]

## 2016-09-21 NOTE — ED Notes (Signed)
Bed: WA20 Expected date:  Expected time:  Means of arrival:  Comments: Hold for EMS

## 2016-09-21 NOTE — ED Notes (Signed)
Update given to [employer] re post-hernia reduction instructions.

## 2016-09-21 NOTE — ED Notes (Signed)
Pt reports pain is constant but the severity comes in waves.  Waiting for [facility]

## 2016-09-21 NOTE — ED Notes (Signed)
Dr. Kieth Brightly states that pt is not going to surgery until after FFPs are finished infusing as ordered and when pt's INR is corrected.  OR also aware.

## 2016-09-21 NOTE — H&P (Signed)
[facility] History and Physical   Patient: Brandon [name]. UUV:[id number]   PCP: Delilah Shan, MD DOB: 1950-01-21   DOA: 09/21/2016   DOS: 09/21/2016   DOS: the patient was seen and examined on 09/21/2016  Patient coming from: The patient is coming from home.  Chief Complaint: Right groin pain  HPI: Brandon Apfel. is a 66 y.o. male with Past medical history of A. fib S/P cardioversion, PE DVT, on chronic anticoagulation with Coumadin, LA thrombus, HTN, dyslipidemia, BPH, nephrolithiasis, SVT. Patient presented with complaints of right groin pain. Patient was seen in the ER on 09/19/2016 with the complaints of abdominal pain and underwent CT scan which showed mild hydronephrosis as well as renal stone. Patient was discharged home with recommendation for outpatient follow-up with urology. When the patient went to see urology today he started having complaints of right groin pain which was progressively getting worse. Patient has a history of inguinal hernia and was able to reduce the hernia before this but this time his hernia was not reducible and therefore patient was sent to ER for further workup. Patient complains about nausea as well as to episode of vomiting. No fever no chills no chest pain no chest heaviness or chest tightness. Last procedure for cardioversion was last year. As well as no recent diagnosis of DVT or PE.  ED Course: Patient was initially evaluated, EDP was not able to reduce the patient's hernia. Gen. surgery was consulted, due to history of A. fib and anticoagulation patient was recommended to be admitted by hospitalist service with general surgery consulting.  At his baseline ambulates without support And is independent for most of his ADL; manages his medication on his own.  Review of Systems: as mentioned in the history of present illness.  All other systems reviewed and are negative.  Past Medical History:  Diagnosis Date  . Anxiety   . Arthritis   .  Benign neoplasm of colon   . Benign prostatic hyperplasia with urinary obstruction   . Bilateral pulmonary embolism ([place]) 3/14   admitted to [facility],  treated with Xarelto  . Cataract   . Chronic pain   . History of pulmonary embolism 03/19/2012  . Hyperlipemia   . Hypertension   . Insomnia   . Major depressive disorder, recurrent episode ([place])   . Mitral regurgitation 04/24/2013   Mild by TEE  . Obesity   . OSA (obstructive sleep apnea)    noncompliant with CPAP.  07/27/13- awaiting a CPAP,  . Osteoporosis   . Persistent atrial fibrillation ([place])   . Restless leg syndrome    takes gabapentin  . Sciatica   . SVT (supraventricular tachycardia) ([place]) 03/07/2016  . Thrombus of left atrial appendage 03/09/2013  . Ventral hernia, unspecified, without mention of obstruction or gangrene    right abdominal wall   Past Surgical History:  Procedure Laterality Date  . CARDIOVERSION N/A 03/21/2012   Procedure: CARDIOVERSION;  Surgeon: Birdie Riddle, MD;  Location: [facility] ENDOSCOPY;  Service: Cardiovascular;  Laterality: N/A;  . CARDIOVERSION N/A 04/24/2013   Procedure: CARDIOVERSION;  Surgeon: Dorothy Spark, MD;  Location: [facility] ENDOSCOPY;  Service: Cardiovascular;  Laterality: N/A;  . CARDIOVERSION N/A 06/27/2015   Procedure: CARDIOVERSION;  Surgeon: Larey Dresser, MD;  Location: [place];  Service: Cardiovascular;  Laterality: N/A;  . CATARACT EXTRACTION    . COLONOSCOPY W/ POLYPECTOMY    . ELECTROPHYSIOLOGIC STUDY N/A 07/30/2015   Procedure: Atrial Fibrillation Ablation;  Surgeon:  Thompson Grayer, MD;  Location: [place] CV LAB;  Service: Cardiovascular;  Laterality: N/A;  . EYE SURGERY Right    cataract  . FEMUR FRACTURE SURGERY    . HERNIA REPAIR  07/06/2011  . INGUINAL HERNIA REPAIR Right 07/28/2013   Procedure: RIGHT INGUINAL HERNIA REPAIR;  Surgeon: Imogene Burn. Georgette Dover, MD;  Location: [place];  Service: General;  Laterality: Right;  . INSERTION OF MESH Right 07/28/2013   Procedure:  INSERTION OF MESH;  Surgeon: Imogene Burn. Georgette Dover, MD;  Location: [place];  Service: General;  Laterality: Right;  . KNEE ARTHROSCOPY     left  . REPLACEMENT TOTAL KNEE Left   . ROTATOR CUFF REPAIR     right  . ROTATOR CUFF REPAIR Left 03/08/2014   DR SUPPLE  . SHOULDER ARTHROSCOPY WITH ROTATOR CUFF REPAIR AND SUBACROMIAL DECOMPRESSION Left 03/08/2014   Procedure: LEFT SHOULDER ARTHROSCOPY WITH ROTATOR CUFF REPAIR/SUBACROMIAL DECOMPRESSION/DISTAL CLAVICLE RESECTION;  Surgeon: Marin Shutter, MD;  Location: [place];  Service: Orthopedics;  Laterality: Left;  . TEE WITHOUT CARDIOVERSION N/A 03/21/2012   Procedure: TRANSESOPHAGEAL ECHOCARDIOGRAM (TEE);  Surgeon: Birdie Riddle, MD;  Location: [place];  Service: Cardiovascular;  Laterality: N/A;  . TEE WITHOUT CARDIOVERSION N/A 04/24/2013   Procedure: TRANSESOPHAGEAL ECHOCARDIOGRAM (TEE);  Surgeon: Dorothy Spark, MD;  Location: [place];  Service: Cardiovascular;  Laterality: N/A;  . TEE WITHOUT CARDIOVERSION N/A 07/29/2015   Procedure: TRANSESOPHAGEAL ECHOCARDIOGRAM (TEE);  Surgeon: Fay Records, MD;  Location: [facility] ENDOSCOPY;  Service: Cardiovascular;  Laterality: N/A;  . TONSILLECTOMY     Social History:  reports that he has quit smoking. He has never used smokeless tobacco. He reports that he drinks about 3.6 oz of alcohol per week . He reports that he does not use drugs.  Allergies  Allergen Reactions  . Ace Inhibitors Cough  . Albuterol Other (See Comments)    Racing heart     Family History  Problem Relation Age of Onset  . Cancer Father        bone  . Cancer Paternal Grandmother        ovarian  . CVA Other        Fam Hx of multiple myeloma  . Diabetes Other        Fam Hx of DM     Prior to Admission medications   Medication Sig Start Date End Date Taking? Authorizing Provider  carvedilol (COREG) 12.5 MG tablet Take 1 tablet (12.5 mg total) by mouth 2 (two) times daily. 03/09/16  Yes Baldwin Jamaica, PA-C  gabapentin  (NEURONTIN) 800 MG tablet TAKE 1 TABLET EVERY DAY WITH DINNER AND AT BEDTIME AS NEEDED Patient taking differently: TAKE 88m by mouth WITH DINNER AND AT BEDTIME AS NEEDED FOR RESTLESS LEG SYNDROME 08/20/16  Yes Sater, RNanine Means MD  losartan (COZAAR) 100 MG tablet Take 1 tablet (100 mg total) by mouth daily. 03/30/16  Yes Allred, JJeneen Rinks MD  spironolactone (ALDACTONE) 25 MG tablet Take 0.5 tablets (12.5 mg total) by mouth daily. Patient taking differently: Take 25 mg by mouth daily.  07/28/16  Yes Allred, JJeneen Rinks MD  acetaminophen (TYLENOL) 500 MG tablet Take 1,500 mg by mouth 3 (three) times daily with meals.     [provider]  amLODipine (NORVASC) 10 MG tablet Take 10 mg by mouth daily.    [provider]  escitalopram (LEXAPRO) 20 MG tablet Take 20 mg by mouth daily.    [provider]  ondansetron (ZOFRAN ODT) 8  MG disintegrating tablet Take 1 tablet (8 mg total) by mouth every 8 (eight) hours as needed for nausea or vomiting. 09/19/16   Jola Schmidt, MD  oxyCODONE-acetaminophen (PERCOCET/ROXICET) 5-325 MG tablet Take 1 tablet by mouth every 4 (four) hours as needed for severe pain. 09/19/16   Jola Schmidt, MD  predniSONE (DELTASONE) 5 MG tablet Take 5-30 mg by mouth See admin instructions. Take 6,5,4,3,2,1 tablet until done 09/17/16   [provider]  tamsulosin (FLOMAX) 0.4 MG CAPS capsule Take 0.4 mg by mouth 2 (two) times daily.     [provider]  warfarin (COUMADIN) 5 MG tablet TAKE 1 TABLET AS DIRECTED BY COUMADIN CLINIC Patient taking differently: TAKE 1 TABLET ('5mg'$ ) all days on Mon and Thurs take 1 1/2 tablet (7.'5mg'$ ) 08/20/16   Thompson Grayer, MD    Physical Exam: Vitals:   09/21/16 1415 09/21/16 1418 09/21/16 1717 09/21/16 1721  BP:  (!) 173/110 (!) 141/69 (!) 141/69  Pulse:  75 83 (!) 42  Resp:  20  18  Temp:  (!) 97.4 F (36.3 C)    TempSrc:  Oral    SpO2:  98% 90% 93%  Weight: 122.5 kg (270 lb)     Height: '6\' 4"'$  (1.93 m)        General: Alert, Awake and Oriented to Time, Place and Person. Appear in moderate distress, affect appropriate Eyes: PERRL, Conjunctiva normal ENT: Oral Mucosa clear moist. Neck: no JVD, no Abnormal Mass Or lumps Cardiovascular: S1 and S2 Present, no Murmur, Peripheral Pulses Present Respiratory: normal respiratory effort, Bilateral Air entry equal and Decreased, no use of accessory muscle, Clear to Auscultation, no Crackles, no wheezes Abdomen: Bowel Sound present, Soft and marked tenderness, right inguinal hernia Skin: no redness, no Rash, no induration Extremities: no Pedal edema, no calf tenderness Neurologic: Grossly no focal neuro deficit. Bilaterally Equal motor strength  Labs on Admission:  CBC:  Recent Labs Lab 09/18/16 2321 09/21/16 1436  WBC 7.1 10.1  NEUTROABS  --  7.5  HGB 13.9 14.2  HCT 40.9 40.6  MCV 88.0 86.4  PLT 190 268   Basic Metabolic Panel:  Recent Labs Lab 09/18/16 2321 09/21/16 1436  NA 139 137  K 3.7 3.1*  CL 102 98*  CO2 25 28  GLUCOSE 158* 141*  BUN 22* 15  CREATININE 1.12 0.86  CALCIUM 9.6 9.5   GFR: Estimated Creatinine Clearance: 117.6 mL/min (by C-G formula based on SCr of 0.86 mg/dL). Liver Function Tests:  Recent Labs Lab 09/18/16 2321 09/21/16 1436  AST 23 23  ALT 15* 18  ALKPHOS 76 75  BILITOT 0.8 0.8  PROT 7.4 7.8  ALBUMIN 4.6 5.0    Recent Labs Lab 09/18/16 2321 09/21/16 1436  LIPASE 36 53*   No results for input(s): AMMONIA in the last 168 hours. Coagulation Profile:  Recent Labs Lab 09/21/16 1436  INR 3.61   Cardiac Enzymes: No results for input(s): CKTOTAL, CKMB, CKMBINDEX, TROPONINI in the last 168 hours. BNP (last 3 results) No results for input(s): PROBNP in the last 8760 hours. HbA1C: No results for input(s): HGBA1C in the last 72 hours. CBG: No results for input(s): GLUCAP in the last 168 hours. Lipid Profile: No results for input(s): CHOL, HDL, LDLCALC, TRIG, CHOLHDL, LDLDIRECT in the  last 72 hours. Thyroid Function Tests: No results for input(s): TSH, T4TOTAL, FREET4, T3FREE, THYROIDAB in the last 72 hours. Anemia Panel: No results for input(s): VITAMINB12, FOLATE, FERRITIN, TIBC, IRON, RETICCTPCT in the last 72 hours.  Urine analysis:    Component Value Date/Time   COLORURINE YELLOW 09/21/2016 1427   APPEARANCEUR CLEAR 09/21/2016 1427   LABSPEC 1.013 09/21/2016 1427   PHURINE 7.0 09/21/2016 1427   GLUCOSEU NEGATIVE 09/21/2016 1427   HGBUR MODERATE (A) 09/21/2016 1427   HGBUR negative 07/04/2007 1112   BILIRUBINUR NEGATIVE 09/21/2016 1427   KETONESUR NEGATIVE 09/21/2016 1427   PROTEINUR NEGATIVE 09/21/2016 1427   UROBILINOGEN 1.0 03/17/2012 0133   NITRITE NEGATIVE 09/21/2016 1427   LEUKOCYTESUR NEGATIVE 09/21/2016 1427    Radiological Exams on Admission: No results found.  Assessment/Plan 1. Incarcerated right inguinal hernia. INR 3.5. General surgery consulted. Initial plan was to reverse with [facility]. Now patient will be reversed with FFP. We'll also get vitamin K. Surgery scheduled for tonight. Patient will remain nothing by mouth for the surgery from now onwards. As per my discussion with Dr. Clarise Cruz he will try to reduce the hernia one more time, and since patient's symptoms have been ongoing for a while and he may not be able to get surgery for a while the surgery can be ION HOLD until his INR is normal.  Fluid management as well as resumption of anticoagulation ideally with heparin recommendation postprocedure per general surgery. Postoperative pain management as well as diet resumption be also based on surgery recommendation.  2. Persistent A. fib  History of DVT PE Left atrial thrombus. Chronic anticoagulation with Coumadin. INR supratherapeutic for now. Receiving FFP for reversal. Would also get vitamin K. Discussed with hematology as well as pharmacy. With history of thrombosis and clot, giving Eppie Gibson would be more risky and therefore  both recommending to use FFP and vitamin K for reversal. Rechecking INR 1 hour after FFP.  3. Essential hypertension. Patient is on Aldactone, losartan, carvedilol and amlodipine at home. Currently nothing by mouth. We will use when necessary hydralazine as well as schedule Lopressor. Negative stress test in March 2018.  4. Restless leg syndrome. Obstructive sleep apnea Patient on gabapentin at home. C Pap daily at bedtime.  5. Hypokalemia. Replacing with IV fluids.  6. Nephrolithiasis with right mild hydronephrosis. Patient was actually scheduled to see urology today in the clinic. Expecting them to follow-up with the patient tomorrow morning.  Nutrition: npo DVT Prophylaxis: on therapeutic anticoagulation.  Advance goals of care discussion: Full code   Consults: Gen. surgery  Family Communication: NO family was present at bedside, at the time of interview.  Disposition: Admitted as inpatient, med-surge unit. Likely to be discharged home, in 3-4 days.  Author: Berle Mull, MD [facility] Pager: [phone] 09/21/2016  If 7PM-7AM, please contact night-coverage [email or web address] Password [email or web address]

## 2016-09-21 NOTE — ED Notes (Signed)
Dr. Zella Richer at bedside, gave VO for pain meds Dilaudid 0.5mg , valium 2mg , and ice pack to R groin prior to reducing inguinal hernia.

## 2016-09-21 NOTE — Progress Notes (Signed)
I have seen and examined him.  In review, he underwent a laparoscopic bilateral inguinal hernia repair and umbilical hernia repair in 2013.  He developed a recurrent right inguinal hernia which was repaired in July 2015.  He subsequently had a recurrence of that in 2016 but because of some significant medical issues it was not repaired.  He developed the incarcerated hernia approximately midday today.  CT scan demonstrates some fat and nondilated small bowel in the hernia.  He was given 0.5 mg of Dilaudid, 2 mg of IV Valium, placed in Trendelenburg position, and had an ice bag placed over the hernia.  Approximately 30 minutes after this, I was able to reduce the hernia.  Plan: Strict bedrest.  Continue ice pack.  Does have risk of possible compromise small bowel thus the reason for admission.  I did recommend that he have repair of his twice recurrent right inguinal hernia while he was here in the hospital given the fact that his INR would be reduced.  I have explained the procedure, risks, and aftercare of recurrent inguinal hernia repair.  Risks include but are not limited to bleeding, infection, wound problems, anesthesia, recurrence, bladder or intestine injury, need for bowel resection, urinary retention, testicular dysfunction or loss, chronic pain, mesh problems.  He seems to understand and agrees with the plan.

## 2016-09-21 NOTE — ED Notes (Signed)
Inguinal hernia reduction successful by Dr. Zella Richer.  Pt is instructed not to get OOB, stay strictly on BR.

## 2016-09-21 NOTE — ED Triage Notes (Signed)
Pt presents with c/o right lower quadrant pain for the last 40 minutes. Pt went to [facility] and was then sent here by EMS. Pt has a hx of kidney stones and a hernia. [facility] was concerned that his hernia has become incarcerated based on his level of pain in the office. Pt was given 8mg  of Morphine IM at [facility].

## 2016-09-22 ENCOUNTER — Inpatient Hospital Stay (HOSPITAL_COMMUNITY): Payer: Medicare Other | Admitting: Registered Nurse

## 2016-09-22 ENCOUNTER — Encounter (HOSPITAL_COMMUNITY)
Admission: EM | Disposition: A | Discharge status: Home / Self Care | Payer: Self-pay | Source: Home / Self Care | Attending: Internal Medicine

## 2016-09-22 ENCOUNTER — Encounter (HOSPITAL_COMMUNITY): Payer: Self-pay | Admitting: Registered Nurse

## 2016-09-22 HISTORY — PX: INGUINAL HERNIA REPAIR: SHX194

## 2016-09-22 LAB — SURGICAL PCR SCREEN
MRSA, PCR: NEGATIVE
Staphylococcus aureus: NEGATIVE

## 2016-09-22 LAB — COMPREHENSIVE METABOLIC PANEL
ALT: 16 U/L — ABNORMAL LOW (ref 17–63)
AST: 21 U/L (ref 15–41)
Albumin: 4.4 g/dL (ref 3.5–5.0)
Alkaline Phosphatase: 71 U/L (ref 38–126)
Anion gap: 11 (ref 5–15)
BUN: 12 mg/dL (ref 6–20)
CO2: 30 mmol/L (ref 22–32)
Calcium: 9.3 mg/dL (ref 8.9–10.3)
Chloride: 100 mmol/L — ABNORMAL LOW (ref 101–111)
Creatinine, Ser: 0.75 mg/dL (ref 0.61–1.24)
GFR calc Af Amer: >60 mL/min (ref >60)
GFR calc non Af Amer: >60 mL/min (ref >60)
Glucose, Bld: 100 mg/dL — ABNORMAL HIGH (ref 65–99)
Potassium: 3.1 mmol/L — ABNORMAL LOW (ref 3.5–5.1)
Sodium: 141 mmol/L (ref 135–145)
Total Bilirubin: 1.3 mg/dL — ABNORMAL HIGH (ref 0.3–1.2)
Total Protein: 7.4 g/dL (ref 6.5–8.1)

## 2016-09-22 LAB — MAGNESIUM: Magnesium: 1.9 mg/dL (ref 1.7–2.4)

## 2016-09-22 LAB — CBC
HCT: 38.2 % — ABNORMAL LOW (ref 39.0–52.0)
Hemoglobin: 13.2 g/dL (ref 13.0–17.0)
MCH: 30.4 pg (ref 26.0–34.0)
MCHC: 34.6 g/dL (ref 30.0–36.0)
MCV: 88 fL (ref 78.0–100.0)
Platelets: 177 10*3/uL (ref 150–400)
RBC: 4.34 MIL/uL (ref 4.22–5.81)
RDW: 13.3 % (ref 11.5–15.5)
WBC: 8.1 10*3/uL (ref 4.0–10.5)

## 2016-09-22 LAB — PROTIME-INR
INR: 1.17
INR: 1.2
Prothrombin Time: 14.8 seconds (ref 11.4–15.2)
Prothrombin Time: 15.1 seconds (ref 11.4–15.2)

## 2016-09-22 SURGERY — REPAIR, HERNIA, INGUINAL, LAPAROSCOPIC
Anesthesia: General | Site: Abdomen | Laterality: Right

## 2016-09-22 MED ORDER — BUPIVACAINE LIPOSOME 1.3 % IJ SUSP
20.0000 mL | Freq: Once | INTRAMUSCULAR | Status: DC
  Filled 2016-09-22: qty 20

## 2016-09-22 MED ORDER — SUGAMMADEX SODIUM 500 MG/5ML IV SOLN
INTRAVENOUS | Status: AC
  Filled 2016-09-22: qty 5

## 2016-09-22 MED ORDER — PROPOFOL 10 MG/ML IV BOLUS
INTRAVENOUS | Status: DC | PRN
  Administered 2016-09-22: 200 mg via INTRAVENOUS

## 2016-09-22 MED ORDER — ROCURONIUM BROMIDE 50 MG/5ML IV SOSY
PREFILLED_SYRINGE | INTRAVENOUS | Status: AC
  Filled 2016-09-22: qty 5

## 2016-09-22 MED ORDER — SUCCINYLCHOLINE CHLORIDE 20 MG/ML IJ SOLN
INTRAMUSCULAR | Status: DC | PRN
  Administered 2016-09-22: 120 mg via INTRAVENOUS

## 2016-09-22 MED ORDER — DEXAMETHASONE SODIUM PHOSPHATE 10 MG/ML IJ SOLN
INTRAMUSCULAR | Status: DC | PRN
  Administered 2016-09-22: 10 mg via INTRAVENOUS

## 2016-09-22 MED ORDER — BUPIVACAINE-EPINEPHRINE 0.25% -1:200000 IJ SOLN
INTRAMUSCULAR | Status: AC
  Filled 2016-09-22: qty 1

## 2016-09-22 MED ORDER — LIDOCAINE HCL (CARDIAC) 20 MG/ML IV SOLN
INTRAVENOUS | Status: DC | PRN
  Administered 2016-09-22: 80 mg via INTRAVENOUS

## 2016-09-22 MED ORDER — 0.9 % SODIUM CHLORIDE (POUR BTL) OPTIME
TOPICAL | Status: DC | PRN
  Administered 2016-09-22: 1000 mL

## 2016-09-22 MED ORDER — CHLORHEXIDINE GLUCONATE CLOTH 2 % EX PADS
6.0000 | MEDICATED_PAD | Freq: Every day | CUTANEOUS | Status: DC
  Administered 2016-09-23 – 2016-09-25 (×3): 6 via TOPICAL

## 2016-09-22 MED ORDER — LORAZEPAM 2 MG/ML IJ SOLN
0.5000 mg | Freq: Four times a day (QID) | INTRAMUSCULAR | Status: DC | PRN
  Administered 2016-09-22: 0.5 mg via INTRAVENOUS
  Filled 2016-09-22: qty 1

## 2016-09-22 MED ORDER — LACTATED RINGERS IV SOLN
INTRAVENOUS | Status: DC
  Administered 2016-09-22: 15:00:00 via INTRAVENOUS

## 2016-09-22 MED ORDER — CEFAZOLIN SODIUM-DEXTROSE 2-4 GM/100ML-% IV SOLN
INTRAVENOUS | Status: AC
  Filled 2016-09-22: qty 100

## 2016-09-22 MED ORDER — BUPIVACAINE HCL 0.25 % IJ SOLN
INTRAMUSCULAR | Status: DC | PRN
  Administered 2016-09-22: 30 mL

## 2016-09-22 MED ORDER — ONDANSETRON HCL 4 MG/2ML IJ SOLN
INTRAMUSCULAR | Status: AC
  Filled 2016-09-22: qty 2

## 2016-09-22 MED ORDER — LABETALOL HCL 5 MG/ML IV SOLN
INTRAVENOUS | Status: AC
  Filled 2016-09-22: qty 4

## 2016-09-22 MED ORDER — LACTATED RINGERS IR SOLN
Status: DC | PRN
  Administered 2016-09-22: 1000 mL

## 2016-09-22 MED ORDER — BUPIVACAINE-EPINEPHRINE (PF) 0.25% -1:200000 IJ SOLN
INTRAMUSCULAR | Status: AC
  Filled 2016-09-22: qty 30

## 2016-09-22 MED ORDER — SUCCINYLCHOLINE CHLORIDE 200 MG/10ML IV SOSY
PREFILLED_SYRINGE | INTRAVENOUS | Status: AC
  Filled 2016-09-22: qty 10

## 2016-09-22 MED ORDER — OXYCODONE HCL 5 MG PO TABS
5.0000 mg | ORAL_TABLET | Freq: Four times a day (QID) | ORAL | Status: DC | PRN
  Administered 2016-09-23 – 2016-09-24 (×2): 5 mg via ORAL
  Filled 2016-09-22 (×2): qty 1

## 2016-09-22 MED ORDER — METOCLOPRAMIDE HCL 5 MG/ML IJ SOLN: 10.0000 mg | Freq: Once | INTRAMUSCULAR | Status: DC | PRN

## 2016-09-22 MED ORDER — MIDAZOLAM HCL 2 MG/2ML IJ SOLN
INTRAMUSCULAR | Status: AC
  Filled 2016-09-22: qty 2

## 2016-09-22 MED ORDER — LIDOCAINE 2% (20 MG/ML) 5 ML SYRINGE
INTRAMUSCULAR | Status: AC
  Filled 2016-09-22: qty 5

## 2016-09-22 MED ORDER — PROPOFOL 10 MG/ML IV BOLUS
INTRAVENOUS | Status: AC
  Filled 2016-09-22: qty 40

## 2016-09-22 MED ORDER — FENTANYL CITRATE (PF) 100 MCG/2ML IJ SOLN: 25.0000 ug | INTRAMUSCULAR | Status: DC | PRN

## 2016-09-22 MED ORDER — FENTANYL CITRATE (PF) 250 MCG/5ML IJ SOLN
INTRAMUSCULAR | Status: AC
  Filled 2016-09-22: qty 5

## 2016-09-22 MED ORDER — CEFAZOLIN SODIUM-DEXTROSE 2-4 GM/100ML-% IV SOLN
2.0000 g | Freq: Once | INTRAVENOUS | Status: AC
  Administered 2016-09-22: 2 g via INTRAVENOUS

## 2016-09-22 MED ORDER — FENTANYL CITRATE (PF) 100 MCG/2ML IJ SOLN
INTRAMUSCULAR | Status: DC | PRN
  Administered 2016-09-22 (×5): 50 ug via INTRAVENOUS

## 2016-09-22 MED ORDER — MEPERIDINE HCL 50 MG/ML IJ SOLN: 6.2500 mg | INTRAMUSCULAR | Status: DC | PRN

## 2016-09-22 MED ORDER — DEXAMETHASONE SODIUM PHOSPHATE 10 MG/ML IJ SOLN
INTRAMUSCULAR | Status: AC
  Filled 2016-09-22: qty 1

## 2016-09-22 MED ORDER — ROCURONIUM BROMIDE 100 MG/10ML IV SOLN
INTRAVENOUS | Status: DC | PRN
  Administered 2016-09-22: 10 mg via INTRAVENOUS
  Administered 2016-09-22: 50 mg via INTRAVENOUS
  Administered 2016-09-22 (×2): 10 mg via INTRAVENOUS

## 2016-09-22 MED ORDER — SUGAMMADEX SODIUM 200 MG/2ML IV SOLN
INTRAVENOUS | Status: DC | PRN
  Administered 2016-09-22: 300 mg via INTRAVENOUS

## 2016-09-22 MED ORDER — MIDAZOLAM HCL 5 MG/5ML IJ SOLN
INTRAMUSCULAR | Status: DC | PRN
  Administered 2016-09-22: 2 mg via INTRAVENOUS

## 2016-09-22 MED ORDER — BUPIVACAINE LIPOSOME 1.3 % IJ SUSP
INTRAMUSCULAR | Status: DC | PRN
  Administered 2016-09-22: 20 mL

## 2016-09-22 SURGICAL SUPPLY — 37 items
APPLIER CLIP 5 13 M/L LIGAMAX5 (MISCELLANEOUS)
BANDAGE ADH SHEER 1  50/CT (GAUZE/BANDAGES/DRESSINGS) ×6 IMPLANT
BENZOIN TINCTURE PRP APPL 2/3 (GAUZE/BANDAGES/DRESSINGS) ×2 IMPLANT
CABLE HIGH FREQUENCY MONO STRZ (ELECTRODE) ×2 IMPLANT
CHLORAPREP W/TINT 26ML (MISCELLANEOUS) ×2 IMPLANT
CLIP APPLIE 5 13 M/L LIGAMAX5 (MISCELLANEOUS) IMPLANT
COVER SURGICAL LIGHT HANDLE (MISCELLANEOUS) ×2 IMPLANT
DECANTER SPIKE VIAL GLASS SM (MISCELLANEOUS) ×2 IMPLANT
DERMABOND ADVANCED (GAUZE/BANDAGES/DRESSINGS) ×1
DERMABOND ADVANCED .7 DNX12 (GAUZE/BANDAGES/DRESSINGS) ×1 IMPLANT
DEVICE SECURE STRAP 25 ABSORB (INSTRUMENTS) ×2 IMPLANT
DRAIN CHANNEL 19F RND (DRAIN) IMPLANT
ELECT REM PT RETURN 15FT ADLT (MISCELLANEOUS) ×2 IMPLANT
EVACUATOR SILICONE 100CC (DRAIN) IMPLANT
GLOVE BIO SURGEON STRL SZ7 (GLOVE) ×2 IMPLANT
GLOVE BIOGEL PI IND STRL 7.0 (GLOVE) ×1 IMPLANT
GLOVE BIOGEL PI INDICATOR 7.0 (GLOVE) ×1
GLOVE SURG SS PI 7.0 STRL IVOR (GLOVE) ×2 IMPLANT
GOWN STRL REUS W/TWL LRG LVL3 (GOWN DISPOSABLE) ×2 IMPLANT
GOWN STRL REUS W/TWL XL LVL3 (GOWN DISPOSABLE) ×4 IMPLANT
GRASPER SUT TROCAR 14GX15 (MISCELLANEOUS) ×2 IMPLANT
HOVERMATT SINGLE USE (MISCELLANEOUS) ×2 IMPLANT
KIT BASIN OR (CUSTOM PROCEDURE TRAY) ×2 IMPLANT
MESH 3DMAX LIGHT 4.8X6.7 RT XL (Mesh General) ×2 IMPLANT
SCISSORS LAP 5X35 DISP (ENDOMECHANICALS) IMPLANT
SET IRRIG TUBING LAPAROSCOPIC (IRRIGATION / IRRIGATOR) ×2 IMPLANT
SHEARS HARMONIC ACE PLUS 36CM (ENDOMECHANICALS) ×2 IMPLANT
STRIP CLOSURE SKIN 1/2X4 (GAUZE/BANDAGES/DRESSINGS) ×2 IMPLANT
SUT ETHILON 2 0 PS N (SUTURE) IMPLANT
SUT MNCRL AB 4-0 PS2 18 (SUTURE) ×2 IMPLANT
SUT VICRYL 0 UR6 27IN ABS (SUTURE) IMPLANT
TOWEL OR 17X26 10 PK STRL BLUE (TOWEL DISPOSABLE) ×2 IMPLANT
TRAY FOLEY W/METER SILVER 16FR (SET/KITS/TRAYS/PACK) IMPLANT
TRAY LAPAROSCOPIC (CUSTOM PROCEDURE TRAY) ×2 IMPLANT
TROCAR CANNULA W/PORT DUAL 5MM (MISCELLANEOUS) ×2 IMPLANT
TROCAR XCEL 12X100 BLDLESS (ENDOMECHANICALS) ×2 IMPLANT
TUBING INSUF HEATED (TUBING) ×2 IMPLANT

## 2016-09-22 NOTE — Care Management Note (Signed)
Case Management Note  Patient Details  Name: Brandon [name]. MRN: [id number] Date of Birth: 1950-07-12  Subjective/Objective:                  abd pain, pancreatitis, renal stone with obstruction  Action/Plan: Date:  September 22, 2016 Chart reviewed for concurrent status and case management needs. Will continue to follow patient progress. Discharge Planning: following for needs Expected discharge date: [id number] Velva Harman, BSN, [place], [place]  Expected Discharge Date:   (unknown)               Expected Discharge Plan:  Home/Self Care  In-House Referral:     Discharge planning Services  CM Consult  Post Acute Care Choice:    Choice offered to:     DME Arranged:    DME Agency:     HH Arranged:    HH Agency:     Status of Service:  In process, will continue to follow  If discussed at Long Length of Stay Meetings, dates discussed:    Additional Comments:  Leeroy Cha, RN 09/22/2016, 8:45 AM

## 2016-09-22 NOTE — Anesthesia Postprocedure Evaluation (Signed)
Anesthesia Post Note  Patient: [name].  Procedure(s) Performed: Procedure(s) (LRB): LAPAROSCOPIC RIGHT INGUINAL HERNIA (Right)     Patient location during evaluation: PACU Anesthesia Type: General Level of consciousness: awake and alert Pain management: pain level controlled Vital Signs Assessment: post-procedure vital signs reviewed and stable Respiratory status: spontaneous breathing, nonlabored ventilation, respiratory function stable and patient connected to nasal cannula oxygen Cardiovascular status: blood pressure returned to baseline and stable Postop Assessment: no apparent nausea or vomiting Anesthetic complications: no    Last Vitals:  Vitals:   09/22/16 1800 09/22/16 1815  BP: 125/72 131/69  Pulse: 72 71  Resp: 12 12  Temp: 36.7 C 36.7 C  SpO2: 95% 96%    Last Pain:  Vitals:   09/22/16 1856  TempSrc:   PainSc: 6                  Montez Hageman

## 2016-09-22 NOTE — Progress Notes (Signed)
  Progress Note: General Surgery Service   Assessment/Plan: Patient Active Problem List   Diagnosis Date Noted  . Incarcerated hernia of abdominal cavity 09/21/2016  . SVT (supraventricular tachycardia) ([place]) 03/07/2016  . Obesity (BMI 30-39.9) 03/07/2016  . Alcohol abuse 03/07/2016  . Demand ischemia of myocardium ([place]) 03/07/2016  . Paroxysmal atrial fibrillation ([facility])   . Restless leg syndrome 02/08/2014  . Current use of long term anticoagulation   . Obstructive sleep apnea 03/09/2013  . Hyperlipidemia 03/21/2012  . History of pulmonary embolism 03/19/2012  . Asthma, exogenous 06/20/2008  . Hypertensive heart disease    Right recurrent inguinal hernia reduced last night. Patient anxious this morning -lap hernia repair today -NPO    LOS: 1 day  Chief Complaint/Subjective: Anxious, numbness over the area, minimal pain  Objective: Vital signs in last 24 hours: Temp:  [97.4 F (36.3 C)-98.4 F (36.9 C)] 98.2 F (36.8 C) (09/18 0700) Pulse Rate:  [42-94] 94 (09/18 0700) Resp:  [11-20] 18 (09/18 0700) BP: (122-173)/(57-110) 159/98 (09/18 0700) SpO2:  [90 %-99 %] 96 % (09/18 0700) Weight:  [122.5 kg (270 lb)] 122.5 kg (270 lb) (09/17 1415) Last BM Date: 09/20/16  Intake/Output from previous day: 09/17 0701 - 09/18 0700 In: 2716 [GHWEX:9371; IV Piggyback:1050] Out: 2450 [Urine:2450] Intake/Output this shift: Total I/O In: 0  Out: 250 [Urine:250]  Lungs: CTAB  Cardiovascular: irreg irreg  Abd: NT, ND, right inguinal hernia  Extremities: no edema  Neuro: AOx4  Lab Results: CBC   Recent Labs  09/21/16 1436 09/22/16 0907  WBC 10.1 8.1  HGB 14.2 13.2  HCT 40.6 38.2*  PLT 173 177   BMET  Recent Labs  09/21/16 1436 09/22/16 0907  NA 137 141  K 3.1* 3.1*  CL 98* 100*  CO2 28 30  GLUCOSE 141* 100*  BUN 15 12  CREATININE 0.86 0.75  CALCIUM 9.5 9.3   PT/INR  Recent Labs  09/21/16 2024 09/22/16 0907  LABPROT 20.4* 14.8  15.1  INR 1.76  1.17  1.20   ABG No results for input(s): PHART, HCO3 in the last 72 hours.  Invalid input(s): PCO2, PO2  Studies/Results:  Anti-infectives: Anti-infectives    None      Medications: Scheduled Meds: . carvedilol  12.5 mg Oral BID WC  . tamsulosin  0.4 mg Oral BID   Continuous Infusions: . sodium chloride    . dextrose 5% lactated ringers     PRN Meds:.acetaminophen **OR** acetaminophen, hydrALAZINE, HYDROmorphone (DILAUDID) injection, LORazepam, ondansetron **OR** ondansetron (ZOFRAN) IV  Mickeal Skinner, MD Pg# [phone] [facility]

## 2016-09-22 NOTE — Transfer of Care (Signed)
Immediate Anesthesia Transfer of Care Note  Patient: Brandon [name].  Procedure(s) Performed: Procedure(s): LAPAROSCOPIC RIGHT INGUINAL HERNIA (Right)  Patient Location: PACU  Anesthesia Type:General  Level of Consciousness: awake, alert  and patient cooperative  Airway & Oxygen Therapy: Patient Spontanous Breathing and Patient connected to face mask oxygen  Post-op Assessment: Report given to RN and Post -op Vital signs reviewed and stable  Post vital signs: Reviewed and stable  Last Vitals:  Vitals:   09/22/16 0700 09/22/16 1306  BP: (!) 159/98 (!) 149/105  Pulse: 94 79  Resp: 18 18  Temp: 36.8 C 36.8 C  SpO2: 96% 94%    Last Pain:  Vitals:   09/22/16 1306  TempSrc: Oral  PainSc:          Complications: No apparent anesthesia complications

## 2016-09-22 NOTE — Progress Notes (Signed)
[facility] Progress Note  Patient: Brandon [name]. IWP:[id number]   PCP: Delilah Shan, MD DOB: 04/05/1950   DOA: 09/21/2016   DOS: 09/22/2016   Date of Service: the patient was seen and examined on 09/22/2016  Subjective: Did not get surgery last night. And surgeon was able to actually reduce the hernia. Patient feels that he is starting to have pain again in his groin. Nausea resolved. No fever no chills. No chest pain or shortness of breath.  Brief hospital course: Pt. with PMH of A. fib S/P cardioversion, PE DVT, on chronic anticoagulation with Coumadin, LA thrombus, HTN, dyslipidemia, BPH, nephrolithiasis, SVT; admitted on 09/21/2016, presented with complaint of right groin pain, was found to have incarcerated right inguinal hernia. Currently further plan is monitor postoperative recovery..  Assessment and Plan: 1. Incarcerated right inguinal hernia. General surgery consulted. Initial plan was to reverse with K Centra. Due to history of PE K Jaclyn Prime was not used. Patient received 4 FFP and 10 mg of vitamin K.  Fluid management as well as resumption of anticoagulation with heparin recommendation postprocedure per general surgery. Postoperative pain management as well as diet resumption be also based on surgery recommendation.  2. Persistent A. fib  History of DVT PE Left atrial thrombus. Chronic anticoagulation with Coumadin. INR supratherapeutic for now. Receiving FFP for reversal. Would also get vitamin K. Discussed with hematology as well as pharmacy. With history of thrombosis and clot, giving Eppie Gibson would be more risky and therefore both recommending to use FFP and vitamin K for reversal. Patient will need to be on heparin bridging along with Coumadin due to his history.  3. Essential hypertension. Patient is on Aldactone, losartan, carvedilol and amlodipine at home. Currently nothing by mouth. We will use when necessary hydralazine Negative stress test in March  2018.  4. Restless leg syndrome. Obstructive sleep apnea Patient on gabapentin at home. C Pap daily at bedtime.  5. Hypokalemia. Replacing with IV fluids.  6. Nephrolithiasis with right mild hydronephrosis. Patient was actually scheduled to see urology today in the clinic. Expecting them to follow-up with the patient as an outpatient  Diet: npo DVT Prophylaxis chronic anticoagulation with Coumadin  Advance goals of care discussion: full code  Family Communication: no family was present at bedside, at the time of interview.  Disposition:  Discharge to home.  Consultants: General surgery  Procedures: none  Antibiotics: Anti-infectives    Start     Dose/Rate Route Frequency Ordered Stop   09/22/16 1530  ceFAZolin (ANCEF) IVPB 2g/100 mL premix     2 g 200 mL/hr over 30 Minutes Intravenous  Once 09/22/16 1522 09/22/16 1606   09/22/16 1523  ceFAZolin (ANCEF) 2-4 GM/100ML-% IVPB    Comments:  Bridget Hartshorn   : cabinet override      09/22/16 1523 09/22/16 1551       Objective: Physical Exam: Vitals:   09/22/16 0430 09/22/16 0451 09/22/16 0700 09/22/16 1306  BP: (!) 143/93 (!) 148/87 (!) 159/98 (!) 149/105  Pulse: 75 70 94 79  Resp: 20 18 18 18   Temp: 98.3 F (36.8 C) 98 F (36.7 C) 98.2 F (36.8 C) 98.2 F (36.8 C)  TempSrc: Oral Oral Oral Oral  SpO2: 93% 94% 96% 94%  Weight:      Height:        Intake/Output Summary (Last 24 hours) at 09/22/16 1720 Last data filed at 09/22/16 1701  Gross per 24 hour  Intake  2716 ml  Output             1725 ml  Net              991 ml   Filed Weights   09/21/16 1415  Weight: 122.5 kg (270 lb)   General: Alert, Awake and Oriented to Time, Place and Person. Appear in mild distress, affect appropriate Eyes: PERRL, Conjunctiva normal ENT: Oral Mucosa clear moist. Neck: no JVD, no Abnormal Mass Or lumps Cardiovascular: S1 and S2 Present, no Murmur, Peripheral Pulses Present Respiratory: normal respiratory  effort, Bilateral Air entry equal and Decreased, no use of accessory muscle, Clear to Auscultation, no Crackles, no wheezes Abdomen: Bowel Sound present, Soft and no tenderness, no hernia Skin: no redness, no Rash, no induration Extremities: no Pedal edema, no calf tenderness Neurologic: Grossly no focal neuro deficit. Bilaterally Equal motor strength  Data Reviewed: CBC:  Recent Labs Lab 09/18/16 2321 09/21/16 1436 09/22/16 0907  WBC 7.1 10.1 8.1  NEUTROABS  --  7.5  --   HGB 13.9 14.2 13.2  HCT 40.9 40.6 38.2*  MCV 88.0 86.4 88.0  PLT 190 173 283   Basic Metabolic Panel:  Recent Labs Lab 09/18/16 2321 09/21/16 1436 09/22/16 0907  NA 139 137 141  K 3.7 3.1* 3.1*  CL 102 98* 100*  CO2 25 28 30   GLUCOSE 158* 141* 100*  BUN 22* 15 12  CREATININE 1.12 0.86 0.75  CALCIUM 9.6 9.5 9.3  MG  --   --  1.9    Liver Function Tests:  Recent Labs Lab 09/18/16 2321 09/21/16 1436 09/22/16 0907  AST 23 23 21   ALT 15* 18 16*  ALKPHOS 76 75 71  BILITOT 0.8 0.8 1.3*  PROT 7.4 7.8 7.4  ALBUMIN 4.6 5.0 4.4    Recent Labs Lab 09/18/16 2321 09/21/16 1436  LIPASE 36 53*   No results for input(s): AMMONIA in the last 168 hours. Coagulation Profile:  Recent Labs Lab 09/21/16 1436 09/21/16 2024 09/22/16 0907  INR 3.61 1.76 1.17  1.20   Cardiac Enzymes: No results for input(s): CKTOTAL, CKMB, CKMBINDEX, TROPONINI in the last 168 hours. BNP (last 3 results) No results for input(s): PROBNP in the last 8760 hours. CBG: No results for input(s): GLUCAP in the last 168 hours. Studies: Ct Abdomen Pelvis Wo Contrast  Result Date: 09/21/2016 CLINICAL DATA:  Right lower quadrant pain history of kidney stones and hernia EXAM: CT ABDOMEN AND PELVIS WITHOUT CONTRAST TECHNIQUE: Multidetector CT imaging of the abdomen and pelvis was performed following the standard protocol without IV contrast. COMPARISON:  09/19/2016 FINDINGS: Lower chest: Dependent atelectasis. No consolidation  or effusion. Borderline cardiomegaly. Hepatobiliary: No focal liver abnormality is seen. No gallstones, gallbladder wall thickening, or biliary dilatation. Pancreas: Unremarkable. No pancreatic ductal dilatation or surrounding inflammatory changes. Spleen: Normal in size without focal abnormality. Adrenals/Urinary Tract: Adrenal glands are within normal limits. 11 mm low-density lesion mid left kidney likely a cyst. Moderate right perinephric fat stranding and mild to moderate hydronephrosis and hydroureter. This is secondary to a 5 mm AP x 8 mm craniocaudad stone in the mid right ureter, this is seen at the approximate L4-L5 disc level. There are intrarenal calculi bilaterally. The adrenal glands appear within normal limits. The bladder is unremarkable. Stomach/Bowel: Stomach is nonenlarged. No dilated small bowel. No colon wall thickening. Colon diverticula without acute inflammation. Vascular/Lymphatic: Aortic atherosclerosis. No enlarged abdominal or pelvic lymph nodes. Reproductive: Prostate is unremarkable. Other: Right inguinal hernia now  contains both fat and a loop of fluid-filled small bowel. There is fluid present within the hernia sac, new compared to prior CT as well as mild soft tissue stranding in the hernia sac. Small left fat containing inguinal hernia. Musculoskeletal: Degenerative changes. No acute or suspicious bone lesion. IMPRESSION: 1. Interval change in appearance of the right inguinal hernia, now contains both fat and a loop of fluid-filled small bowel. There also appears to be fluid and soft tissue stranding within the right hernia sac concerning for incarceration. There is no evidence for a small bowel obstruction related to the hernia. 2. Right perinephric fat stranding and mild to moderate right hydronephrosis and hydroureter, secondary to a 5 mm AP x 8 mm craniocaudad stone in the mid right ureter, not much interval inferior migration since the prior CT. 3. There are bilateral  intrarenal calculi 4. Colon diverticula without acute inflammation. Electronically Signed   By: Donavan Foil M.D.   On: 09/21/2016 18:15    Scheduled Meds: . bupivacaine liposome  20 mL Infiltration Once  . [MAR Hold] carvedilol  12.5 mg Oral BID WC  . [MAR Hold] Chlorhexidine Gluconate Cloth  6 each Topical Q0600  . [MAR Hold] tamsulosin  0.4 mg Oral BID   Continuous Infusions: . [MAR Hold] sodium chloride    . dextrose 5% lactated ringers    . lactated ringers 50 mL/hr at 09/22/16 1446   PRN Meds: 0.9 % irrigation (POUR BTL), [MAR Hold] acetaminophen **OR** [MAR Hold] acetaminophen, bupivacaine, bupivacaine liposome, [MAR Hold] hydrALAZINE, [MAR Hold]  HYDROmorphone (DILAUDID) injection, lactated ringers, [MAR Hold] LORazepam, [MAR Hold] ondansetron **OR** [MAR Hold] ondansetron (ZOFRAN) IV  Time spent: 35 minutes  Author: Berle Mull, MD [facility] Pager: [phone] 09/22/2016 5:20 PM  If 7PM-7AM, please contact night-coverage at [email or web address], password [facility]

## 2016-09-22 NOTE — Anesthesia Procedure Notes (Signed)
Procedure Name: Intubation Date/Time: 09/22/2016 3:45 PM Performed by: Dione Booze Pre-anesthesia Checklist: Suction available, Patient being monitored, Emergency Drugs available and Patient identified Patient Re-evaluated:Patient Re-evaluated prior to induction Oxygen Delivery Method: Circle system utilized Preoxygenation: Pre-oxygenation with 100% oxygen Induction Type: IV induction Laryngoscope Size: Mac and 4 Grade View: Grade I Tube type: Oral Tube size: 7.5 mm Number of attempts: 1 Airway Equipment and Method: Stylet Placement Confirmation: ETT inserted through vocal cords under direct vision,  positive ETCO2 and breath sounds checked- equal and bilateral Secured at: 23 cm Tube secured with: Tape Dental Injury: Teeth and Oropharynx as per pre-operative assessment and Injury to lip

## 2016-09-22 NOTE — Anesthesia Preprocedure Evaluation (Signed)
Anesthesia Evaluation  Patient identified by MRN, date of birth, ID band Patient awake    Reviewed: Allergy & Precautions, NPO status , Patient's Chart, lab work & pertinent test results  Airway Mallampati: II  TM Distance: >3 FB Neck ROM: Full    Dental no notable dental hx.    Pulmonary sleep apnea , former smoker, PE   Pulmonary exam normal breath sounds clear to auscultation       Cardiovascular hypertension, Pt. on medications Normal cardiovascular exam+ dysrhythmias Atrial Fibrillation and Supra Ventricular Tachycardia  Rhythm:Regular Rate:Normal     Neuro/Psych negative neurological ROS  negative psych ROS   GI/Hepatic negative GI ROS, Neg liver ROS,   Endo/Other  negative endocrine ROS  Renal/GU negative Renal ROS  negative genitourinary   Musculoskeletal negative musculoskeletal ROS (+)   Abdominal   Peds negative pediatric ROS (+)  Hematology negative hematology ROS (+)   Anesthesia Other Findings   Reproductive/Obstetrics negative OB ROS                             Anesthesia Physical Anesthesia Plan  ASA: III  Anesthesia Plan: General   Post-op Pain Management:    Induction: Intravenous  PONV Risk Score and Plan: 3 and Ondansetron, Dexamethasone, Midazolam and Treatment may vary due to age or medical condition  Airway Management Planned: Oral ETT  Additional Equipment:   Intra-op Plan:   Post-operative Plan: Extubation in OR  Informed Consent: I have reviewed the patients History and Physical, chart, labs and discussed the procedure including the risks, benefits and alternatives for the proposed anesthesia with the patient or authorized representative who has indicated his/her understanding and acceptance.   Dental advisory given  Plan Discussed with: CRNA  Anesthesia Plan Comments:         Anesthesia Quick Evaluation

## 2016-09-22 NOTE — Op Note (Signed)
Preop diagnosis: right recurrent inguinal hernia  Postop diagnosis: right recurrent indirect inguinal hernia  Procedure: laparoscopic Right recurrent inguinal hernia repair with mesh, removal of mesh  Surgeon: Gurney Maxin, M.D.  Asst: none  Anesthesia: Gen.   Indications for procedure: Brandon [name]. is a 66 y.o. male with symptoms of pain and enlarging Right inguinal hernia(s). Yesterday it was incarcerated and reduced at bedside. Today, he presents for repair. His coumadin has been reversed with FFP. After discussing risks, alternatives and benefits he decided on laparoscopic repair and was brought to day surgery for repair.  Description of procedure: The patient was brought into the operative suite, placed supine. Anesthesia was administered with endotracheal tube. Patient was strapped in place. The patient was prepped and draped in the usual sterile fashion.  Next quarter percent Marcaine was injected to the Left of the umbilicus, and a transverse 2 cm incision was made. Dissection was used to visualize the anterior rectus sheath. Anterior rectus sheath was sharply incised, next to the 12 mm trocar was inserted into the preperitoneal space.   On initial visualization, There was a hernia in the indirect space with no visceral contents. The peritoneum was incised and a flap was made to gain access to the preperitoneal space. Next a began our dissection identifying the ASIS laterally and then working back medially removing the filmy tissue and adhesions of the peritoneum to the abdominal wall. Hernia sac was completely dissected out of the canal.  The previous mesh was contracted and medial to the inferior epigastrics. The mesh was freed from the surrounding tissue with harmonic scalpel and removed via the periumbilical trocar.  Vas deference and contents of the cord were safely dissected away of the hernia sac.   A 3D max extra large light weight mesh was inserted and tacked medially to  the lacunar ligament. The mesh was positioned flat and directly up against the direct and indirect areas. Multiple tacks were used to secure the mesh in the lateral, anterior space. The remainder of the Exparel mix was infused along the line of the inguinal ligament deep to the fascia. The CO2 was evacuated while watching to ensure the mesh did not migrate. The anterior rectus fascia was closed with 0 vicryl in interrupted sutures and all skin incisions were closed with 4-0 monocryl subcu stitch. The patient awoke from anesthesia and was brought to PACU in stable condition.  Findings: large right recurrent indirect inguinal hernia  Specimen: none  Blood loss: 30 ml  Local anesthesia: 50 ml Exparel:Marcaine mix  Complications: none  Implant: right extra large Bard 3D max light mesh  Gurney Maxin, M.D. General, Bariatric, & Minimally Invasive Surgery [facility], [place] 5:27 PM 09/22/2016

## 2016-09-23 ENCOUNTER — Other Ambulatory Visit (HOSPITAL_COMMUNITY): Payer: Medicare Other

## 2016-09-23 ENCOUNTER — Encounter (HOSPITAL_COMMUNITY): Payer: Self-pay | Admitting: General Surgery

## 2016-09-23 DIAGNOSIS — I471 Supraventricular tachycardia: Secondary | ICD-10-CM

## 2016-09-23 DIAGNOSIS — K450 Other specified abdominal hernia with obstruction, without gangrene: Secondary | ICD-10-CM

## 2016-09-23 LAB — PREPARE FRESH FROZEN PLASMA
Unit division: 0
Unit division: 0
Unit division: 0
Unit division: 0

## 2016-09-23 LAB — CBC
HCT: 39.1 % (ref 39.0–52.0)
Hemoglobin: 13.4 g/dL (ref 13.0–17.0)
MCH: 30 pg (ref 26.0–34.0)
MCHC: 34.3 g/dL (ref 30.0–36.0)
MCV: 87.5 fL (ref 78.0–100.0)
Platelets: 194 10*3/uL (ref 150–400)
RBC: 4.47 MIL/uL (ref 4.22–5.81)
RDW: 13.3 % (ref 11.5–15.5)
WBC: 10.5 10*3/uL (ref 4.0–10.5)

## 2016-09-23 LAB — PROTIME-INR
INR: 1.16
Prothrombin Time: 14.7 seconds (ref 11.4–15.2)

## 2016-09-23 LAB — BPAM FFP
Blood Product Expiration Date: 201809222359
Blood Product Expiration Date: 201809222359
Blood Product Expiration Date: 201809222359
Blood Product Expiration Date: 201809222359
ISSUE DATE / TIME: 201809171817
ISSUE DATE / TIME: 201809172206
ISSUE DATE / TIME: 201809180037
ISSUE DATE / TIME: 201809180422
Unit Type and Rh: 5100
Unit Type and Rh: 5100
Unit Type and Rh: 5100
Unit Type and Rh: 9500

## 2016-09-23 LAB — BASIC METABOLIC PANEL
Anion gap: 10 (ref 5–15)
Anion gap: 14 (ref 5–15)
BUN: 14 mg/dL (ref 6–20)
BUN: 13 mg/dL (ref 6–20)
CO2: 29 mmol/L (ref 22–32)
CO2: 23 mmol/L (ref 22–32)
Calcium: 9.2 mg/dL (ref 8.9–10.3)
Calcium: 9.3 mg/dL (ref 8.9–10.3)
Chloride: 102 mmol/L (ref 101–111)
Chloride: 101 mmol/L (ref 101–111)
Creatinine, Ser: 0.81 mg/dL (ref 0.61–1.24)
Creatinine, Ser: 1.1 mg/dL (ref 0.61–1.24)
GFR calc Af Amer: >60 mL/min (ref >60)
GFR calc Af Amer: >60 mL/min (ref >60)
GFR calc non Af Amer: >60 mL/min (ref >60)
GFR calc non Af Amer: >60 mL/min (ref >60)
Glucose, Bld: 135 mg/dL — ABNORMAL HIGH (ref 65–99)
Glucose, Bld: 152 mg/dL — ABNORMAL HIGH (ref 65–99)
Potassium: 3.3 mmol/L — ABNORMAL LOW (ref 3.5–5.1)
Potassium: 2.8 mmol/L — ABNORMAL LOW (ref 3.5–5.1)
Sodium: 141 mmol/L (ref 135–145)
Sodium: 138 mmol/L (ref 135–145)

## 2016-09-23 LAB — APTT: aPTT: 27 seconds (ref 24–36)

## 2016-09-23 MED ORDER — AMIODARONE IV BOLUS ONLY 150 MG/100ML: 150.0000 mg | Freq: Once | INTRAVENOUS | Status: DC

## 2016-09-23 MED ORDER — ADENOSINE 6 MG/2ML IV SOLN
INTRAVENOUS | Status: AC
  Filled 2016-09-23: qty 2

## 2016-09-23 MED ORDER — POLYETHYLENE GLYCOL 3350 17 G PO PACK: 17.0000 g | PACK | Freq: Every day | ORAL | 0 refills | Status: DC

## 2016-09-23 MED ORDER — ADENOSINE 6 MG/2ML IV SOLN
12.0000 mg | Freq: Once | INTRAVENOUS | Status: AC
  Administered 2016-09-23: 12 mg via INTRAVENOUS

## 2016-09-23 MED ORDER — MAGNESIUM SULFATE 2 GM/50ML IV SOLN
2.0000 g | Freq: Once | INTRAVENOUS | Status: AC
  Administered 2016-09-23: 2 g via INTRAVENOUS
  Filled 2016-09-23: qty 50

## 2016-09-23 MED ORDER — OXYCODONE-ACETAMINOPHEN 5-325 MG PO TABS: 1.0000 | ORAL_TABLET | ORAL | 0 refills | Status: DC | PRN

## 2016-09-23 MED ORDER — ACETAMINOPHEN 325 MG PO TABS
ORAL_TABLET | ORAL | Status: DC
Start: 2016-09-23 — End: 2018-03-18

## 2016-09-23 MED ORDER — WARFARIN - PHARMACIST DOSING INPATIENT: Freq: Every day | Status: DC

## 2016-09-23 MED ORDER — SODIUM CHLORIDE 0.9 % IV BOLUS (SEPSIS)
1000.0000 mL | Freq: Once | INTRAVENOUS | Status: AC
  Administered 2016-09-23: 1000 mL via INTRAVENOUS

## 2016-09-23 MED ORDER — POTASSIUM CHLORIDE CRYS ER 20 MEQ PO TBCR
40.0000 meq | EXTENDED_RELEASE_TABLET | ORAL | Status: AC
  Administered 2016-09-23 (×2): 40 meq via ORAL
  Filled 2016-09-23 (×2): qty 2

## 2016-09-23 MED ORDER — POTASSIUM CHLORIDE CRYS ER 20 MEQ PO TBCR
40.0000 meq | EXTENDED_RELEASE_TABLET | Freq: Once | ORAL | Status: AC
  Administered 2016-09-23: 40 meq via ORAL
  Filled 2016-09-23: qty 2

## 2016-09-23 MED ORDER — ENOXAPARIN SODIUM 120 MG/0.8ML ~~LOC~~ SOLN
120.0000 mg | Freq: Two times a day (BID) | SUBCUTANEOUS | 0 refills | Status: DC
Start: 2016-09-23 — End: 2016-09-25

## 2016-09-23 MED ORDER — METOPROLOL TARTRATE 5 MG/5ML IV SOLN
5.0000 mg | Freq: Once | INTRAVENOUS | Status: AC
  Administered 2016-09-23: 5 mg via INTRAVENOUS
  Filled 2016-09-23: qty 5

## 2016-09-23 MED ORDER — SENNA 8.6 MG PO TABS
1.0000 | ORAL_TABLET | Freq: Every day | ORAL | Status: DC
  Administered 2016-09-23: 8.6 mg via ORAL
  Filled 2016-09-23: qty 1

## 2016-09-23 MED ORDER — ENOXAPARIN SODIUM 120 MG/0.8ML ~~LOC~~ SOLN
120.0000 mg | Freq: Two times a day (BID) | SUBCUTANEOUS | Status: DC
  Administered 2016-09-24: 120 mg via SUBCUTANEOUS
  Filled 2016-09-23 (×2): qty 0.8

## 2016-09-23 MED ORDER — ADENOSINE 6 MG/2ML IV SOLN
6.0000 mg | Freq: Once | INTRAVENOUS | Status: AC
  Administered 2016-09-23: 6 mg via INTRAVENOUS
  Filled 2016-09-23 (×2): qty 2

## 2016-09-23 MED ORDER — POTASSIUM CHLORIDE 10 MEQ/100ML IV SOLN
10.0000 meq | INTRAVENOUS | Status: DC
  Administered 2016-09-23 (×2): 10 meq via INTRAVENOUS
  Filled 2016-09-23: qty 100

## 2016-09-23 MED ORDER — ADENOSINE 6 MG/2ML IV SOLN
6.0000 mg | Freq: Once | INTRAVENOUS | Status: AC
  Administered 2016-09-23: 6 mg via INTRAVENOUS

## 2016-09-23 MED ORDER — ADENOSINE 6 MG/2ML IV SOLN
INTRAVENOUS | Status: AC
  Filled 2016-09-23: qty 4

## 2016-09-23 MED ORDER — WARFARIN SODIUM 5 MG PO TABS
10.0000 mg | ORAL_TABLET | Freq: Once | ORAL | Status: AC
Start: 2016-09-23 — End: 2016-09-23
  Administered 2016-09-23: 10 mg via ORAL
  Filled 2016-09-23: qty 2

## 2016-09-23 MED ORDER — POLYETHYLENE GLYCOL 3350 17 G PO PACK
17.0000 g | PACK | Freq: Every day | ORAL | Status: DC
  Administered 2016-09-23 – 2016-09-25 (×3): 17 g via ORAL
  Filled 2016-09-23 (×3): qty 1

## 2016-09-23 NOTE — Progress Notes (Signed)
1 Day Post-Op    CC:  Incarcerated hernia  Subjective: On clears so far but anxious to get out of hospital.   Objective: Vital signs in last 24 hours: Temp:  [98 F (36.7 C)-98.3 F (36.8 C)] 98.1 F (36.7 C) (09/19 0426) Pulse Rate:  [71-85] 76 (09/19 0426) Resp:  [12-20] 20 (09/19 0426) BP: (125-149)/(69-105) 131/78 (09/19 0426) SpO2:  [92 %-96 %] 94 % (09/19 0426) Last BM Date: 09/20/16 270 Po 2050 IV 1150 urine Afebrile, VSS K+ 3.3 WBC/H/H is stable Intake/Output from previous day: 09/18 0701 - 09/19 0700 In: 2320 [P.O.:270; I.V.:2050] Out: 1200 [Urine:1150; Blood:50] Intake/Output this shift: Total I/O In: 240 [P.O.:240] Out: -   General appearance: alert, cooperative and no distress GI: soft, sore sites all look fine.  Lab Results:   Recent Labs  09/22/16 0907 09/23/16 0611  WBC 8.1 10.5  HGB 13.2 13.4  HCT 38.2* 39.1  PLT 177 194    BMET  Recent Labs  09/22/16 0907 09/23/16 0611  NA 141 141  K 3.1* 3.3*  CL 100* 102  CO2 30 29  GLUCOSE 100* 135*  BUN 12 14  CREATININE 0.75 0.81  CALCIUM 9.3 9.2   PT/INR  Recent Labs  09/22/16 0907 09/23/16 0611  LABPROT 14.8  15.1 14.7  INR 1.17  1.20 1.16     Recent Labs Lab 09/18/16 2321 09/21/16 1436 09/22/16 0907  AST 23 23 21   ALT 15* 18 16*  ALKPHOS 76 75 71  BILITOT 0.8 0.8 1.3*  PROT 7.4 7.8 7.4  ALBUMIN 4.6 5.0 4.4     Lipase     Component Value Date/Time   LIPASE 53 (H) 09/21/2016 1436     Medications: . carvedilol  12.5 mg Oral BID WC  . Chlorhexidine Gluconate Cloth  6 each Topical Q0600  . tamsulosin  0.4 mg Oral BID   . sodium chloride    . dextrose 5% lactated ringers 125 mL/hr at 09/23/16 0424   Anti-infectives    Start     Dose/Rate Route Frequency Ordered Stop   09/22/16 1530  ceFAZolin (ANCEF) IVPB 2g/100 mL premix     2 g 200 mL/hr over 30 Minutes Intravenous  Once 09/22/16 1522 09/22/16 1606   09/22/16 1523  ceFAZolin (ANCEF) 2-4 GM/100ML-% IVPB     Comments:  Bridget Hartshorn   : cabinet override      09/22/16 1523 09/22/16 1551      Assessment/Plan Incarcerated RIH S/p RIH repair - open 09/22/16, Dr. Lurena Joiner Kinsinger  POD 1 Nephrolithiasis AF and Hx of PE on coumadin  INR 3.6 - Dr. Doylene Canard Nephrolithiais Hx of femur repair OSA RLS FEN:  IV fluids/cardiac diet ID:  Ancef pre op DVT: SCD/INR 1/16   Plan:  Advance diet, he can start Lovenox bridging later today, restart coumadin.  When he can eat regular diet, tolerated  Pain with PO  Pain medicines, walk and void without issue he can go from our standpoint. I cannot get into [place] from this computer, I will check later.  He reports 4 tablets of Percocet at home.  I will not use ibuprofen since he is on coumadin and bridging with Lovenox.   LOS: 2 days    Braydan Marriott 09/23/2016 [phone]

## 2016-09-23 NOTE — Discharge Instructions (Signed)
[facility] _______[facility]  UMBILICAL OR INGUINAL HERNIA REPAIR: POST OP INSTRUCTIONS No lifting over 10 pounds for 6 weeks. Always review your discharge instruction sheet given to you by the facility where your surgery was performed. IF YOU HAVE DISABILITY OR FAMILY LEAVE FORMS, YOU MUST BRING THEM TO THE OFFICE FOR PROCESSING.   DO NOT GIVE THEM TO YOUR DOCTOR.  1. A  prescription for pain medication may be given to you upon discharge.  Take your pain medication as prescribed, if needed.  If narcotic pain medicine is not needed, then you may take acetaminophen (Tylenol) or ibuprofen (Advil) as needed. 2. Take your usually prescribed medications unless otherwise directed. If you need a refill on your pain medication, please contact your pharmacy.  They will contact our office to request authorization. Prescriptions will not be filled after 5 pm or on week-ends. 3. You should follow a light diet the first 24 hours after arrival home, such as soup and crackers, etc.  Be sure to include lots of fluids daily.  Resume your normal diet the day after surgery. 4.Most patients will experience some swelling and bruising around the umbilicus or in the groin and scrotum.  Ice packs and reclining will help.  Swelling and bruising can take several days to resolve.  6. It is common to experience some constipation if taking pain medication after surgery.  Increasing fluid intake and taking a stool softener (such as Colace) will usually help or prevent this problem from occurring.  A mild laxative (Milk of Magnesia or Miralax) should be taken according to package directions if there are no bowel movements after 48 hours. 7. Unless discharge instructions indicate otherwise, you may remove your bandages 24-48 hours after surgery, and you may shower at that time.  You may have steri-strips (small skin tapes) in place directly over the incision.  These strips should be left on the skin for 7-10 days.  If your  surgeon used skin glue on the incision, you may shower in 24 hours.  The glue will flake off over the next 2-3 weeks.  Any sutures or staples will be removed at the office during your follow-up visit. 8. ACTIVITIES:  You may resume regular (light) daily activities beginning the next day--such as daily self-care, walking, climbing stairs--gradually increasing activities as tolerated.  You may have sexual intercourse when it is comfortable.  Refrain from any heavy lifting or straining until approved by your doctor.  a.You may drive when you are no longer taking prescription pain medication, you can comfortably wear a seatbelt, and you can safely maneuver your car and apply brakes. b.RETURN TO WORK:   _____________________________________________  9.You should see your doctor in the office for a follow-up appointment approximately 2-3 weeks after your surgery.  Make sure that you call for this appointment within a day or two after you arrive home to insure a convenient appointment time. 10.OTHER INSTRUCTIONS: _________________________    _____________________________________  WHEN TO CALL YOUR DOCTOR: 1. Fever over 101.0 2. Inability to urinate 3. Nausea and/or vomiting 4. Extreme swelling or bruising 5. Continued bleeding from incision. 6. Increased pain, redness, or drainage from the incision  The clinic staff is available to answer your questions during regular business hours.  Please dont hesitate to call and ask to speak to one of the nurses for clinical concerns.  If you have a medical emergency, go to the nearest emergency room or call 911.  A surgeon from [facility] is always  on call at the hospital   [address] [place], [place], [place]  [address] ?  [address], [place], [place]   [address] [phone] ? [phone] ? FAX [phone] Web site: [email or web address]

## 2016-09-23 NOTE — Progress Notes (Signed)
  Progress Note: General Surgery Service   Assessment/Plan: Patient Active Problem List   Diagnosis Date Noted  . Incarcerated hernia of abdominal cavity 09/21/2016  . SVT (supraventricular tachycardia) ([place]) 03/07/2016  . Obesity (BMI 30-39.9) 03/07/2016  . Alcohol abuse 03/07/2016  . Demand ischemia of myocardium ([place]) 03/07/2016  . Paroxysmal atrial fibrillation ([facility])   . Restless leg syndrome 02/08/2014  . Current use of long term anticoagulation   . Obstructive sleep apnea 03/09/2013  . Hyperlipidemia 03/21/2012  . History of pulmonary embolism 03/19/2012  . Asthma, exogenous 06/20/2008  . Hypertensive heart disease    s/p Procedure(s): LAPAROSCOPIC RIGHT INGUINAL HERNIA 09/22/2016 -advance to heart healthy diet -ok to restart anticoagulation for a fib -ok to discharge from surgery standpoint -ice prn to groin for discomfort or swelling    LOS: 2 days  Chief Complaint/Subjective: Pain minimal, tolerating liquids  Objective: Vital signs in last 24 hours: Temp:  [98 F (36.7 C)-98.3 F (36.8 C)] 98.1 F (36.7 C) (09/19 0426) Pulse Rate:  [71-85] 76 (09/19 0426) Resp:  [12-20] 20 (09/19 0426) BP: (125-149)/(69-105) 131/78 (09/19 0426) SpO2:  [92 %-96 %] 94 % (09/19 0426) Last BM Date: 09/20/16  Intake/Output from previous day: 09/18 0701 - 09/19 0700 In: 2320 [P.O.:270; I.V.:2050] Out: 1200 [Urine:1150; Blood:50] Intake/Output this shift: Total I/O In: 240 [P.O.:240] Out: -   Lungs: CTAB  Cardiovascular: irreg irreg  Abd: soft, ATTP, incisions c/d/i, no ecchymosis or swelling at R groin site  Extremities: no edema  Neuro: AOx4  Lab Results: CBC   Recent Labs  09/22/16 0907 09/23/16 0611  WBC 8.1 10.5  HGB 13.2 13.4  HCT 38.2* 39.1  PLT 177 194   BMET  Recent Labs  09/22/16 0907 09/23/16 0611  NA 141 141  K 3.1* 3.3*  CL 100* 102  CO2 30 29  GLUCOSE 100* 135*  BUN 12 14  CREATININE 0.75 0.81  CALCIUM 9.3 9.2   PT/INR  Recent  Labs  09/22/16 0907 09/23/16 0611  LABPROT 14.8  15.1 14.7  INR 1.17  1.20 1.16   ABG No results for input(s): PHART, HCO3 in the last 72 hours.  Invalid input(s): PCO2, PO2  Studies/Results:  Anti-infectives: Anti-infectives    Start     Dose/Rate Route Frequency Ordered Stop   09/22/16 1530  ceFAZolin (ANCEF) IVPB 2g/100 mL premix     2 g 200 mL/hr over 30 Minutes Intravenous  Once 09/22/16 1522 09/22/16 1606   09/22/16 1523  ceFAZolin (ANCEF) 2-4 GM/100ML-% IVPB    Comments:  Bridget Hartshorn   : cabinet override      09/22/16 1523 09/22/16 1551      Medications: Scheduled Meds: . carvedilol  12.5 mg Oral BID WC  . Chlorhexidine Gluconate Cloth  6 each Topical Q0600  . enoxaparin (LOVENOX) injection  120 mg Subcutaneous Q12H  . polyethylene glycol  17 g Oral Daily  . senna  1 tablet Oral Daily  . tamsulosin  0.4 mg Oral BID   Continuous Infusions: . sodium chloride    . dextrose 5% lactated ringers 125 mL/hr at 09/23/16 0424   PRN Meds:.acetaminophen **OR** acetaminophen, hydrALAZINE, HYDROmorphone (DILAUDID) injection, LORazepam, ondansetron **OR** ondansetron (ZOFRAN) IV, oxyCODONE  Mickeal Skinner, MD Pg# [phone] [facility]

## 2016-09-23 NOTE — Progress Notes (Signed)
ANTICOAGULATION CONSULT NOTE - Initial Consult  Pharmacy Consult for warfarin, Lovenox bridge Indication: afib, PE/DVT, LA thrombus  Allergies  Allergen Reactions  . Ace Inhibitors Cough  . Albuterol Other (See Comments)    Racing heart    Patient Measurements: Height: 6\' 4"  (193 cm) Weight: 270 lb (122.5 kg) IBW/kg (Calculated) : 86.8  Vital Signs: Temp: 98.1 F (36.7 C) (09/19 0426) Temp Source: Oral (09/19 0426) BP: 131/78 (09/19 0426) Pulse Rate: 76 (09/19 0426)  Labs:  Recent Labs  09/21/16 1436 09/21/16 2024 09/22/16 0907 09/23/16 0611  HGB 14.2  --  13.2 13.4  HCT 40.6  --  38.2* 39.1  PLT 173  --  177 194  APTT  --   --   --  27  LABPROT 35.7* 20.4* 14.8  15.1 14.7  INR 3.61 1.76 1.17  1.20 1.16  CREATININE 0.86  --  0.75 0.81    Estimated Creatinine Clearance: 124.8 mL/min (by C-G formula based on SCr of 0.81 mg/dL).   Medical History: Past Medical History:  Diagnosis Date  . Anxiety   . Arthritis   . Benign neoplasm of colon   . Benign prostatic hyperplasia with urinary obstruction   . Bilateral pulmonary embolism ([place]) 3/14   admitted to [facility],  treated with Xarelto  . Cataract   . Chronic pain   . History of pulmonary embolism 03/19/2012  . Hyperlipemia   . Hypertension   . Insomnia   . Major depressive disorder, recurrent episode ([place])   . Mitral regurgitation 04/24/2013   Mild by TEE  . Obesity   . OSA (obstructive sleep apnea)    noncompliant with CPAP.  07/27/13- awaiting a CPAP,  . Osteoporosis   . Persistent atrial fibrillation ([place])   . Restless leg syndrome    takes gabapentin  . Sciatica   . SVT (supraventricular tachycardia) ([place]) 03/07/2016  . Thrombus of left atrial appendage 03/09/2013  . Ventral hernia, unspecified, without mention of obstruction or gangrene    right abdominal wall    Medications:  Scheduled:  . carvedilol  12.5 mg Oral BID WC  . Chlorhexidine Gluconate Cloth  6 each Topical Q0600  .  tamsulosin  0.4 mg Oral BID   Infusions:  . sodium chloride    . dextrose 5% lactated ringers 125 mL/hr at 09/23/16 0424    Assessment: 39 yoM presented on 9/17 with incarcerated hernia s/p laproscopic right inguinal hernia repair on 9/18.  PMH is significant for chronic warfarin anticoagulation for hx of Afib s/p cardioversion, PE DVT, and LA thrombus.  Pharmacy is now consulted to resume warfarin dosing with Lovenox bridge.  PTA Warfarin: 5 mg daily except 7.5 mg on Tues/Thursdays. Admission INR 3.61 Reversed on 9/17 with Vit K 10mg  IV and FFP x4  Today, 09/23/2016: INR 1.16 CBC: Hgb 13.4 and Plt 194 are stable No bleeding or surgical complications reported. Drug-drug interactions: expect that IV vit K given 9/17 may suppress INR for several days.   Goal of Therapy:  INR 2-3 Anti-Xa level 0.6-1 units/ml 4hrs after LMWH dose given Monitor platelets by anticoagulation protocol: Yes   Plan:   Lovenox 120 mg (1 mg/kg) SQ BID - first dose not until 1800 tonight (24 hrs after AET)  Warfarin 10 mg PO x 1 - give dose early on 9/19 before pending discharge  Daily PT/INR.  Monitor for signs and symptoms of bleeding.  Patient to follow up with [facility] on Friday, 9/21  and Monday, 9/24 per MD.     Gretta Arab PharmD, BCPS Pager [phone] 09/23/2016 9:41 AM

## 2016-09-23 NOTE — Progress Notes (Signed)
Upon transferring pt to SD bed and connecting pt to SD HR monitor. Pt began feeling diaphoretic again. Pt connected to HR monitor and was found to be in SVT HR 170-190 bpm. Dr. Porfirio Mylar paged and MD arrived to bedside. Will continue to monitor.

## 2016-09-23 NOTE — Progress Notes (Addendum)
PROGRESS NOTE  Brandon B Brotz Jr. EPP:[id number] DOB: Feb 09, 1950 DOA: 09/21/2016 PCP: Delilah Shan, MD  HPI/Recap of past 24 hours:  Planned discharge cancelled, due to patient went  Into svt while pushing down stairs for discharge He is being transferred to stepdown unit for adenosine administration   Assessment/Plan: Principal Problem:   Incarcerated hernia of abdominal cavity Active Problems:   Hypertensive heart disease   History of pulmonary embolism   Obstructive sleep apnea   Paroxysmal atrial fibrillation (HCC)   SVT (supraventricular tachycardia) (HCC)   Obesity (BMI 30-39.9)  SVT: heart rate of 160's Sudden onset of SVT while getting down stairs for discharge on 9/19, Discharge cancelled.  He is feeling dizzy and sweaty, denies chest pain, no hypoxia, bp stable He is transferred to stepdown for adenosine administration, iv mag also ordered Echo and Stat bmp ordered   Addendum: patient heart rhythm converted back to sinus rhythm with pvc's without needing adenosine He is in the process of transfer to stepdown, continue Iv mag, hold adenosine for now, echo pending  stat bmp pending, will follow result.   Addendum: patient went back to svt when he arrived to icu/stepdown, heart rate in 180's,  His blood pressure dropped into the 70's, he is diaphoretic. He remained in SVT after adenosine 6mg  x2, he converted back to sinus tachycardia after 12mg  adenosine iv. STAT cardiology consulted, case discussed with Dr Cathie Olden who recommended iv lopressor x5mg  for now. Dr Cathie Olden will come to evaluate patient.   Ns 1liter bolus ordered, stat bmp showed potassium 2.8, will give potassium oral 41meq x2 doses, and iv potassium 106meq.   H/o  A. fib  History of DVT PE Left atrial thrombus. Negative stress test in March 2018. Chronic anticoagulation with Coumadin. Discussed with hematology as well as pharmacy. With history of thrombosis and clot,giving Eppie Gibson would be more risky and  therefore both recommending to use FFP and vitamin K for reversal. He is restarted on coumadin with lovenox bridging    Hypokalemia. Replace k, check mag  Incarcerated right inguinal hernia. Coumadin held since admission, Due to history of PE K Jaclyn Prime was not used. Patient received 4 FFP and 10 mg of vitamin K. He underwent laparoscopic Rightrecurrentinguinal hernia repair with mesh, removal of mesh on 9/18, general surgery oked with resuming coumadin with lovenox bridging 24hrs post op.   Essential hypertension. Home meds Aldactone, losartan, carvedilol and amlodipine held initially due to npo,  Coreg resumed on 9/19 Likely able to resume the rest of home meds tomorrow   Restless leg syndrome.Patient on gabapentin at home.  Body mass index is 32.87 kg/m. Obstructive sleep apnea, he report he could not tolerate cpap    Nephrolithiasis with right mild hydronephrosis. Patient was seen in the ED on 9/15 for kidney stone and referred to see urology on 9/17, he developed abdominal pain while at urology's office, he was sent to the ED and found to have incarcerated hernia ua on admission, no blood He is to follow with urology  as an outpatient  H/o alcohol use: report quit in 06/2016   DVT Prophylaxis chronic anticoagulation with Coumadin  Advance goals of care discussion:full code  Family Communication:wife (seperated) over the phone   Disposition: Discharge cancelled, transfer to stepdown due to svt  Consultants:General surgery  Procedures:  laparoscopic Rightrecurrentinguinal hernia repair with mesh, removal of mesh on 9/18  Antibiotics:  Anti-infectives   Start   Dose/Rate Route Frequency Ordered Stop   09/22/16 1530  ceFAZolin (ANCEF) IVPB 2g/100 mL premix   2 g 200 mL/hr over 30 Minutes Intravenous Once 09/22/16 1522 09/22/16 1606   09/22/16 1523  ceFAZolin (ANCEF) 2-4 GM/100ML-% IVPB  Comments:  Bridget Hartshorn : cabinet override      09/22/16 1523 09/22/16 1551       Objective: BP (!) 159/103 (BP Location: Right Arm)   Pulse 85   Temp 98.3 F (36.8 C) (Oral)   Resp 18   Ht 6\' 4"  (1.93 m)   Wt 122.5 kg (270 lb)   SpO2 97%   BMI 32.87 kg/m   Intake/Output Summary (Last 24 hours) at 09/23/16 1448 Last data filed at 09/23/16 1310  Gross per 24 hour  Intake             2560 ml  Output             1600 ml  Net              960 ml   Filed Weights   09/21/16 1415  Weight: 122.5 kg (270 lb)    Exam: Patient is examined daily including today on 09/23/2016, exams remain the same as of yesterday except that has changed    General:  Does not seem to be comfortable  Cardiovascular: tachycardia  Respiratory: CTABL  Abdomen: incision C/D/I, Soft/ND/NT, positive BS  Musculoskeletal: No Edema  Neuro: alert, oriented   Data Reviewed: Basic Metabolic Panel:  Recent Labs Lab 09/18/16 2321 09/21/16 1436 09/22/16 0907 09/23/16 0611  NA 139 137 141 141  K 3.7 3.1* 3.1* 3.3*  CL 102 98* 100* 102  CO2 25 28 30 29   GLUCOSE 158* 141* 100* 135*  BUN 22* 15 12 14   CREATININE 1.12 0.86 0.75 0.81  CALCIUM 9.6 9.5 9.3 9.2  MG  --   --  1.9  --    Liver Function Tests:  Recent Labs Lab 09/18/16 2321 09/21/16 1436 09/22/16 0907  AST 23 23 21   ALT 15* 18 16*  ALKPHOS 76 75 71  BILITOT 0.8 0.8 1.3*  PROT 7.4 7.8 7.4  ALBUMIN 4.6 5.0 4.4    Recent Labs Lab 09/18/16 2321 09/21/16 1436  LIPASE 36 53*   No results for input(s): AMMONIA in the last 168 hours. CBC:  Recent Labs Lab 09/18/16 2321 09/21/16 1436 09/22/16 0907 09/23/16 0611  WBC 7.1 10.1 8.1 10.5  NEUTROABS  --  7.5  --   --   HGB 13.9 14.2 13.2 13.4  HCT 40.9 40.6 38.2* 39.1  MCV 88.0 86.4 88.0 87.5  PLT 190 173 177 194   Cardiac Enzymes:   No results for input(s): CKTOTAL, CKMB, CKMBINDEX, TROPONINI in the last 168 hours. BNP (last 3 results)  Recent Labs  10/23/15 1107    BNP 91.6    ProBNP (last 3 results) No results for input(s): PROBNP in the last 8760 hours.  CBG: No results for input(s): GLUCAP in the last 168 hours.  Recent Results (from the past 240 hour(s))  Surgical pcr screen     Status: None   Collection Time: 09/22/16  1:58 PM  Result Value Ref Range Status   MRSA, PCR NEGATIVE NEGATIVE Final   Staphylococcus aureus NEGATIVE NEGATIVE Final    Comment: (NOTE) The Xpert SA Assay (FDA approved for NASAL specimens in patients 68 years of age and older), is one component of a comprehensive surveillance program.  It is not intended to diagnose infection nor to guide or monitor treatment.      Studies: No results found.  Scheduled Meds: . adenosine (ADENOCARD) IV  6 mg Intravenous Once  . carvedilol  12.5 mg Oral BID WC  . Chlorhexidine Gluconate Cloth  6 each Topical Q0600  . enoxaparin (LOVENOX) injection  120 mg Subcutaneous Q12H  . polyethylene glycol  17 g Oral Daily  . senna  1 tablet Oral Daily  . tamsulosin  0.4 mg Oral BID  . Warfarin - Pharmacist Dosing Inpatient   Does not apply q1800    Continuous Infusions: . sodium chloride    . dextrose 5% lactated ringers 125 mL/hr at 09/23/16 1237  . magnesium sulfate 1 - 4 g bolus IVPB      Total Critical Care time in examining the patient bedside, evaluating Lab work and other data, over half of the total time was spent in coordinating patient care on the floor or bedisde in talking to patient/family members, communicating with nursing Staff on the floor and sub specialists general surgery and stat cardiology consult to coordinate patients medical care and needs is 90 Minutes.  The condition which has caused critical injury/acute impairment of  vital organ system with a high probability of sudden clinically significant deterioration and can cause Potential Life threatening injury to this patient addressed today is SVT    I have personally reviewed and interpreted on  09/23/2016   daily labs, tele strips, imagings as discussed above under data review session and assessment and plans.  I reviewed all nursing notes, pharmacy notes, consultant notes,  vitals, pertinent old records  I have discussed plan of care as described above with RN , patient and family on 09/23/2016   Kaydie Petsch MD, PhD  [facility] Pager [phone]. If 7PM-7AM, please contact night-coverage at [email or web address], password [facility] 09/23/2016, 2:48 PM  LOS: 2 days

## 2016-09-23 NOTE — Consult Note (Signed)
Cardiology Consultation:   Patient ID: Brandon [name].; [id number]; 04-09-1950   Admit date: 09/21/2016 Date of Consult: 09/23/2016  Primary Care Provider: Delilah Shan, MD Primary Cardiologist:  Allred  Primary Electrophysiologist:   Allred    Patient Profile:   Brandon Patricia. is a 66 y.o. male with a hx of SVT, PAF , HTN, hyperlipidemia  who is being seen today for the evaluation of  SVT  at the request of Dr. Erlinda Hong.  History of Present Illness:   Brandon [name] is a 66 year old gentleman with a history of hypertension, supraventricular tachycardia, paroxysmal atrial fibrillation, hyperlipidemia. He was recently admitted on 9/17  for hernia repair. He was being discharged from the hospital today when he developed sudden episode of tachycardia.  He was brought back to the intensive care unit where an EKG revealed supraventricular tachycardia with a heart rate of 180.   His systolic blood pressure was in the 70-80 range.He was given 6 mg of adenosine on 2 occasions with no improvement.   We were contacted by Dr. Erlinda Hong  for stat consultation for assistance in managing this SVT.   He was then received adenosine 12 mg IV push which converted him to normal sinus rhythm.     He is on chronic anticoagulation with Coumadin because of his history of atrial fibrillation.  He's had issues with hypokalemia in the past.  His potassium this afternoon was 2.8. This is being replaced currently. He's having lots of premature ventricular contractions which are likely due to this hypokalemia.  He denies any angina. He is a little bit short of breath this afternoon following the prolonged episode of SVT. He's had occasional episodes of SVT. These usually only last for a relatively short amount of time. He's had one SVT ablation in the past. Dr. Rayann Heman has offered to do a second SVT ablation but he's not had significant episodes of SVT since that all of her was made.   Past Medical History:  Diagnosis Date  .  Anxiety   . Arthritis   . Benign neoplasm of colon   . Benign prostatic hyperplasia with urinary obstruction   . Bilateral pulmonary embolism ([place]) 3/14   admitted to [facility],  treated with Xarelto  . Cataract   . Chronic pain   . History of pulmonary embolism 03/19/2012  . Hyperlipemia   . Hypertension   . Insomnia   . Major depressive disorder, recurrent episode ([place])   . Mitral regurgitation 04/24/2013   Mild by TEE  . Obesity   . OSA (obstructive sleep apnea)    noncompliant with CPAP.  07/27/13- awaiting a CPAP,  . Osteoporosis   . Persistent atrial fibrillation ([place])   . Restless leg syndrome    takes gabapentin  . Sciatica   . SVT (supraventricular tachycardia) ([place]) 03/07/2016  . Thrombus of left atrial appendage 03/09/2013  . Ventral hernia, unspecified, without mention of obstruction or gangrene    right abdominal wall    Past Surgical History:  Procedure Laterality Date  . CARDIOVERSION N/A 03/21/2012   Procedure: CARDIOVERSION;  Surgeon: Birdie Riddle, MD;  Location: [facility] ENDOSCOPY;  Service: Cardiovascular;  Laterality: N/A;  . CARDIOVERSION N/A 04/24/2013   Procedure: CARDIOVERSION;  Surgeon: Dorothy Spark, MD;  Location: [facility] ENDOSCOPY;  Service: Cardiovascular;  Laterality: N/A;  . CARDIOVERSION N/A 06/27/2015   Procedure: CARDIOVERSION;  Surgeon: Larey Dresser, MD;  Location: [place];  Service: Cardiovascular;  Laterality: N/A;  .  CATARACT EXTRACTION    . COLONOSCOPY W/ POLYPECTOMY    . ELECTROPHYSIOLOGIC STUDY N/A 07/30/2015   Procedure: Atrial Fibrillation Ablation;  Surgeon: Thompson Grayer, MD;  Location: [place] CV LAB;  Service: Cardiovascular;  Laterality: N/A;  . EYE SURGERY Right    cataract  . FEMUR FRACTURE SURGERY    . HERNIA REPAIR  07/06/2011  . INGUINAL HERNIA REPAIR Right 07/28/2013   Procedure: RIGHT INGUINAL HERNIA REPAIR;  Surgeon: Imogene Burn. Georgette Dover, MD;  Location: [place];  Service: General;  Laterality: Right;  . INGUINAL HERNIA REPAIR  Right 09/22/2016   Procedure: LAPAROSCOPIC RIGHT INGUINAL HERNIA;  Surgeon: Kinsinger, Arta Bruce, MD;  Location: [facility] ORS;  Service: General;  Laterality: Right;  With MESH  . INSERTION OF MESH Right 07/28/2013   Procedure: INSERTION OF MESH;  Surgeon: Imogene Burn. Georgette Dover, MD;  Location: [place];  Service: General;  Laterality: Right;  . KNEE ARTHROSCOPY     left  . REPLACEMENT TOTAL KNEE Left   . ROTATOR CUFF REPAIR     right  . ROTATOR CUFF REPAIR Left 03/08/2014   DR SUPPLE  . SHOULDER ARTHROSCOPY WITH ROTATOR CUFF REPAIR AND SUBACROMIAL DECOMPRESSION Left 03/08/2014   Procedure: LEFT SHOULDER ARTHROSCOPY WITH ROTATOR CUFF REPAIR/SUBACROMIAL DECOMPRESSION/DISTAL CLAVICLE RESECTION;  Surgeon: Marin Shutter, MD;  Location: [place];  Service: Orthopedics;  Laterality: Left;  . TEE WITHOUT CARDIOVERSION N/A 03/21/2012   Procedure: TRANSESOPHAGEAL ECHOCARDIOGRAM (TEE);  Surgeon: Birdie Riddle, MD;  Location: [place];  Service: Cardiovascular;  Laterality: N/A;  . TEE WITHOUT CARDIOVERSION N/A 04/24/2013   Procedure: TRANSESOPHAGEAL ECHOCARDIOGRAM (TEE);  Surgeon: Dorothy Spark, MD;  Location: [place];  Service: Cardiovascular;  Laterality: N/A;  . TEE WITHOUT CARDIOVERSION N/A 07/29/2015   Procedure: TRANSESOPHAGEAL ECHOCARDIOGRAM (TEE);  Surgeon: Fay Records, MD;  Location: [facility] ENDOSCOPY;  Service: Cardiovascular;  Laterality: N/A;  . TONSILLECTOMY       Home Medications:  Prior to Admission medications   Medication Sig Start Date End Date Taking? Authorizing Provider  acetaminophen (TYLENOL) 500 MG tablet Take 1,500 mg by mouth 3 (three) times daily with meals.    Yes [provider]  amLODipine (NORVASC) 10 MG tablet Take 10 mg by mouth daily.   Yes [provider]  carvedilol (COREG) 12.5 MG tablet Take 1 tablet (12.5 mg total) by mouth 2 (two) times daily. 03/09/16  Yes Baldwin Jamaica, PA-C  gabapentin (NEURONTIN) 800 MG tablet TAKE 1 TABLET EVERY DAY WITH DINNER  AND AT BEDTIME AS NEEDED Patient taking differently: TAKE 873m by mouth WITH DINNER AND AT BEDTIME AS NEEDED FOR RESTLESS LEG SYNDROME 08/20/16  Yes Sater, RNanine Means MD  losartan (COZAAR) 100 MG tablet Take 1 tablet (100 mg total) by mouth daily. 03/30/16  Yes Allred, JJeneen Rinks MD  predniSONE (DELTASONE) 5 MG tablet Take 5-30 mg by mouth See admin instructions. Take 6,5,4,3,2,1 tablet until done 09/17/16  Yes [provider]  spironolactone (ALDACTONE) 25 MG tablet Take 0.5 tablets (12.5 mg total) by mouth daily. Patient taking differently: Take 25 mg by mouth daily.  07/28/16  Yes Allred, JJeneen Rinks MD  warfarin (COUMADIN) 5 MG tablet TAKE 1 TABLET AS DIRECTED BY COUMADIN CLINIC Patient taking differently: Take 1 tablet (5 mg) by mouth daily except on Tues and Thurs Take 1 1/2 tablet (7.52m 08/20/16  Yes Allred, JaJeneen RinksMD  acetaminophen (TYLENOL) 325 MG tablet You can take 2 tablets every 4 hours as need and alternate with the prescribed pain medicine,  which also has Tylenol.    DO NOT TAKE MORE THAN 4000 MG OF TYLENOL PER DAY.  IT CAN HARM YOUR LIVER.  TYLENOL (ACETAMINOPHEN) IS ALSO IN YOUR PRESCRIPTION PAIN MEDICATION.  YOU HAVE TO COUNT IT IN YOUR DAILY TOTAL. 09/23/16   Earnstine Regal, PA-C  enoxaparin (LOVENOX) 120 MG/0.8ML injection Inject 0.8 mLs (120 mg total) into the skin every 12 (twelve) hours. 09/23/16 09/28/16  Florencia Reasons, MD  ondansetron (ZOFRAN ODT) 8 MG disintegrating tablet Take 1 tablet (8 mg total) by mouth every 8 (eight) hours as needed for nausea or vomiting. 09/19/16   Jola Schmidt, MD  oxyCODONE-acetaminophen (PERCOCET/ROXICET) 5-325 MG tablet Take 1-2 tablets by mouth every 4 (four) hours as needed for severe pain. 09/23/16   Earnstine Regal, PA-C  polyethylene glycol [facility] / Floria Raveling) packet Take 17 g by mouth daily. 09/24/16   Florencia Reasons, MD  tamsulosin (FLOMAX) 0.4 MG CAPS capsule Take 0.4 mg by mouth 2 (two) times daily.     [provider]    Inpatient  Medications: Scheduled Meds: . adenosine      . adenosine      . carvedilol  12.5 mg Oral BID WC  . Chlorhexidine Gluconate Cloth  6 each Topical Q0600  . enoxaparin (LOVENOX) injection  120 mg Subcutaneous Q12H  . polyethylene glycol  17 g Oral Daily  . potassium chloride  40 mEq Oral Q4H  . senna  1 tablet Oral Daily  . tamsulosin  0.4 mg Oral BID  . Warfarin - Pharmacist Dosing Inpatient   Does not apply q1800   Continuous Infusions: . sodium chloride    . amiodarone    . dextrose 5% lactated ringers 125 mL/hr at 09/23/16 1237  . potassium chloride 10 mEq (09/23/16 1622)  . sodium chloride 1,000 mL (09/23/16 1551)   PRN Meds: acetaminophen **OR** acetaminophen, hydrALAZINE, HYDROmorphone (DILAUDID) injection, LORazepam, ondansetron **OR** ondansetron (ZOFRAN) IV, oxyCODONE  Allergies:    Allergies  Allergen Reactions  . Ace Inhibitors Cough  . Albuterol Other (See Comments)    Racing heart    Social History:   Social History   Social History  . Marital status: Married    Spouse name: N/A  . Number of children: N/A  . Years of education: N/A   Occupational History  . Not on file.   Social History Main Topics  . Smoking status: Former Research scientist (life sciences)  . Smokeless tobacco: Never Used  . Alcohol use 3.6 oz/week    6 Shots of liquor per week     Comment: 2 drinks per night  . Drug use: No     Comment: negative hx for IV drug abuse  . Sexual activity: Not on file   Other Topics Concern  . Not on file   Social History Narrative   Lives with wife in [place]   Retired Dietitian    Family History:    Family History  Problem Relation Age of Onset  . Cancer Father        bone  . Cancer Paternal Grandmother        ovarian  . CVA Other        Fam Hx of multiple myeloma  . Diabetes Other        Fam Hx of DM     ROS:  Please see the history of present illness.  ROS  All other ROS reviewed and negative.     Physical Exam/Data:   Vitals:  09/23/16 1545 09/23/16 1550 09/23/16 1600 09/23/16 1615  BP: (!) 74/57 (!) 79/49 (!) 141/95 137/82  Pulse: (!) 185 (!) 183 100 95  Resp: _0 Temp:      TempSrc:      SpO2: 98% 98% 98% 97%  Weight:      Height:        Intake/Output Summary (Last 24 hours) at 09/23/16 1627 Last data filed at 09/23/16 1310  Gross per 24 hour  Intake             2560 ml  Output             1600 ml  Net              960 ml   Filed Weights   09/21/16 1415  Weight: 270 lb (122.5 kg)   Body mass index is 32.87 kg/m.  General:  Well nourished, well developed, in no acute distress HEENT: normal Lymph: no adenopathy Neck: no JVD Endocrine:  No thryomegaly Vascular: No carotid bruits; FA pulses 2+ bilaterally without bruits  Cardiac:  normal S1, S2; RR; no murmur  Lungs:  clear to auscultation bilaterally, no wheezing, rhonchi or rales  Abd: soft, nontender, no hepatomegaly  Ext: no edema Musculoskeletal:  No deformities, BUE and BLE strength normal and equal Skin: warm and dry  Neuro:  CNs 2-12 intact, no focal abnormalities noted Psych:  Normal affect   EKG:  The EKG was personally reviewed and demonstrates:    superventricular tachycardia at a rate of 166. There are associated ST-T wave changes. Telemetry:  Telemetry was personally reviewed and demonstrates:  Normal sinus rhythm with frequent premature ventricular contractions  Relevant CV Studies:   Laboratory Data:  Chemistry Recent Labs Lab 09/22/16 0907 09/23/16 0611 09/23/16 1501  NA 141 141 138  K 3.1* 3.3* 2.8*  CL 100* 102 101  CO2 _1 GLUCOSE 100* 135* 152*  BUN _2 CREATININE 0.75 0.81 1.10  CALCIUM 9.3 9.2 9.3  GFRNONAA >60 >60 >60  GFRAA >60 >60 >60  ANIONGAP _3 Recent Labs Lab 09/18/16 2321 09/21/16 1436 09/22/16 0907  PROT 7.4 7.8 7.4  ALBUMIN 4.6 5.0 4.4  AST _4 ALT 15* 18 16*  ALKPHOS 76 75 71  BILITOT 0.8 0.8 1.3*   Hematology Recent Labs Lab  09/21/16 1436 09/22/16 0907 09/23/16 0611  WBC 10.1 8.1 10.5  RBC 4.70 4.34 4.47  HGB 14.2 13.2 13.4  HCT 40.6 38.2* 39.1  MCV 86.4 88.0 87.5  MCH 30.2 30.4 30.0  MCHC 35.0 34.6 34.3  RDW 13.3 13.3 13.3  PLT 173 177 194   Cardiac EnzymesNo results for input(s): TROPONINI in the last 168 hours. No results for input(s): TROPIPOC in the last 168 hours.  BNPNo results for input(s): BNP, PROBNP in the last 168 hours.  DDimer No results for input(s): DDIMER in the last 168 hours.  Radiology/Studies:  Ct Abdomen Pelvis Wo Contrast  Result Date: 09/21/2016 CLINICAL DATA:  Right lower quadrant pain history of kidney stones and hernia EXAM: CT ABDOMEN AND PELVIS WITHOUT CONTRAST TECHNIQUE: Multidetector CT imaging of the abdomen and pelvis was performed following the standard protocol without IV contrast. COMPARISON:  09/19/2016 FINDINGS: Lower chest: Dependent atelectasis. No consolidation or effusion. Borderline cardiomegaly. Hepatobiliary: No focal liver abnormality is seen. No gallstones, gallbladder wall thickening, or biliary dilatation. Pancreas: Unremarkable. No pancreatic ductal dilatation or surrounding  inflammatory changes. Spleen: Normal in size without focal abnormality. Adrenals/Urinary Tract: Adrenal glands are within normal limits. 11 mm low-density lesion mid left kidney likely a cyst. Moderate right perinephric fat stranding and mild to moderate hydronephrosis and hydroureter. This is secondary to a 5 mm AP x 8 mm craniocaudad stone in the mid right ureter, this is seen at the approximate L4-L5 disc level. There are intrarenal calculi bilaterally. The adrenal glands appear within normal limits. The bladder is unremarkable. Stomach/Bowel: Stomach is nonenlarged. No dilated small bowel. No colon wall thickening. Colon diverticula without acute inflammation. Vascular/Lymphatic: Aortic atherosclerosis. No enlarged abdominal or pelvic lymph nodes. Reproductive: Prostate is unremarkable.  Other: Right inguinal hernia now contains both fat and a loop of fluid-filled small bowel. There is fluid present within the hernia sac, new compared to prior CT as well as mild soft tissue stranding in the hernia sac. Small left fat containing inguinal hernia. Musculoskeletal: Degenerative changes. No acute or suspicious bone lesion. IMPRESSION: 1. Interval change in appearance of the right inguinal hernia, now contains both fat and a loop of fluid-filled small bowel. There also appears to be fluid and soft tissue stranding within the right hernia sac concerning for incarceration. There is no evidence for a small bowel obstruction related to the hernia. 2. Right perinephric fat stranding and mild to moderate right hydronephrosis and hydroureter, secondary to a 5 mm AP x 8 mm craniocaudad stone in the mid right ureter, not much interval inferior migration since the prior CT. 3. There are bilateral intrarenal calculi 4. Colon diverticula without acute inflammation. Electronically Signed   By: Donavan Foil M.D.   On: 09/21/2016 18:15    Assessment and Plan:   1. Supraventricular tachycardia: Elim is seen today for recurrent SVT. He has a history of SVT and has had an SVT ablation in the past. Despite that ablation, he has continued to have episodes of SVT and has been offered a second ablation but he's not had enough prolonged episodes to warrant that second ablation. Today he had an episode of SVT while being discharged from the hospital following a hernia repair 2 days ago. His potassium is 2.8.  I suspect that he developed the SVT following a premature ventricular contraction. His potassium has been supplemented and his PVCs seem to be improving. He's been NPO since Monday and ate his first meal today. I suspect that he's missed some doses of his carvedilol as well.  He states that his blood pressure occasionally runs low. We'll decrease the amlodipine to 5 g a day.  We may be even able to discontinue the  amlodipine entirely.  Another consideration is that we could discontinue the carvedilol and try him on metoprolol which may be more useful in blocking  the AV node and not causing a decrease in his blood pressure as much as the carvedilol would.  I agree with repeating his echocardiogram for further assessment of his left ventricular function before we send him home.  2. Essential hypertension: Informed me that he was only taking carvedilol 12.5 mg once a day. This was because his blood pressure was marginally low. He's on high-dose amlodipine and high-dose losartan. Certainly think that we need to increase his AV nodal blocking drugs and decrease the amlodipine.  3.  Hypokalemia: He's currently getting potassium supplement. He states that his potassium typically runs low. He is on Aldactone 25  mg a day.    For questions or updates, please contact [place] Please consult  [email or web address] for contact info under Cardiology/STEMI.   Signed, Mertie Moores, MD  09/23/2016 4:27 PM

## 2016-09-23 NOTE — Discharge Summary (Addendum)
Discharge Summary  Brandon B Parisien Jr. IRC:[id number] DOB: 12-26-1950  PCP: Delilah Shan, MD  Admit date: 09/21/2016 Discharge date: 09/25/2016  Time spent: >81mins, more than 50% time spent on coordination of care  Recommendations for Outpatient Follow-up:  1. F/u with PMD within a week  for hospital discharge follow up, repeat cbc/bmp at follow up 2. Follow up with coumadin clinic on Friday and Monday 3. F/u with general surgery 4. F/u with urology 5. F/u with cardiology  Discharge Diagnoses:  Active Hospital Problems   Diagnosis Date Noted  . Incarcerated hernia of abdominal cavity 09/21/2016  . SVT (supraventricular tachycardia) ([facility]) 03/07/2016  . Obesity (BMI 30-39.9) 03/07/2016  . Paroxysmal atrial fibrillation ([facility])   . Obstructive sleep apnea 03/09/2013  . History of pulmonary embolism 03/19/2012  . Hypertensive heart disease     Resolved Hospital Problems   Diagnosis Date Noted Date Resolved  No resolved problems to display.    Discharge Condition: stable  Diet recommendation: heart healthy  Filed Weights   09/21/16 1415 09/24/16 0500 09/25/16 0349  Weight: 122.5 kg (270 lb) 113.1 kg (249 lb 5.4 oz) 115.3 kg (254 lb 3.1 oz)    History of present illness:  Patient coming from: The patient is coming from home.  Chief Complaint: Right groin pain  HPI: Brandon Haynesworth. is a 66 y.o. male with Past medical history of A. fib S/P cardioversion, PE DVT, on chronic anticoagulation with Coumadin, LA thrombus, HTN, dyslipidemia, BPH, nephrolithiasis, SVT. Patient presented with complaints of right groin pain. Patient was seen in the ER on 09/19/2016 with the complaints of abdominal pain and underwent CT scan which showed mild hydronephrosis as well as renal stone. Patient was discharged home with recommendation for outpatient follow-up with urology. When the patient went to see urology today he started having complaints of right groin pain which was progressively getting  worse. Patient has a history of inguinal hernia and was able to reduce the hernia before this but this time his hernia was not reducible and therefore patient was sent to ER for further workup. Patient complains about nausea as well as to episode of vomiting. No fever no chills no chest pain no chest heaviness or chest tightness. Last procedure for cardioversion was last year. As well as no recent diagnosis of DVT or PE.  ED Course: Patient was initially evaluated, EDP was not able to reduce the patient's hernia. Gen. surgery was consulted, due to history of A. fib and anticoagulation patient was recommended to be admitted by hospitalist service with general surgery consulting.  At his baseline ambulates without support And is independent for most of his ADL; manages his medication on his own.  Hospital Course:  Principal Problem:   Incarcerated hernia of abdominal cavity Active Problems:   Hypertensive heart disease   History of pulmonary embolism   Obstructive sleep apnea   Paroxysmal atrial fibrillation ([facility])   SVT (supraventricular tachycardia) ([facility])   Obesity (BMI 30-39.9)  SVT: heart rate of 160's Negative stress test in March 2018. Sudden onset of SVT while getting down stairs for discharge on 9/19, Discharge cancelled.  First episode of svt converted back to sinus rhythm with pvc's without needing adenosine. patient went back to svt when he arrived to icu/stepdown, heart rate in 180's,  His blood pressure dropped into the 70's, he is diaphoretic. He remained in SVT after adenosine 6mg  x2, he converted back to sinus tachycardia after 12mg  adenosine iv. Ns iliter bolus and potassium  supplement provided, STAT cardiology consulted, case discussed with Dr Cathie Olden. Echo lvef wnl. Coreg dose increased, he is to follow up with cardiology closely. May need ablation.   Hypokalemia. k Replaced.  mag 2  Essential hypertension. Home meds Aldactone, losartan, carvedilol and amlodipine  held initially due to npo,  Coreg resumed on 9/19, blood pressure start to increase, increase coreg to 25mg  bid, start losartan /spironolactine and norvasc    Incarcerated right inguinal hernia. Coumadin held since admission, Due to history of PE K Jaclyn Prime was not used. Patient received 4 FFP and 10 mg of vitamin K. He underwent laparoscopic Right recurrent inguinal hernia repair with mesh, removal of mesh on 9/18  general surgery oked with resuming coumadin with lovenox bridging 24hrs post op. he is cleared to discharge home by general surgery. He is to follow up with general surgery in two weeks.  Persistent A. fib  History of DVT PE Left atrial thrombus. Chronic anticoagulation with Coumadin. Discussed with hematology as well as pharmacy. With history of thrombosis and clot,giving Eppie Gibson would be more risky and therefore both recommending to use FFP and vitamin K for reversal. He is restarted on coumadin with lovenox bridging , he is to follow up with coumadin clinic closely.   Obstructive sleep apnea, .C Pap daily at bedtime ,but report noncompliance. Restless leg syndrome, Patient on gabapentin at home.  Nephrolithiasis with right mild hydronephrosis. Patient was actually scheduled to see urology today in the clinic. Expecting them to follow-up with the patient as an outpatient    Advance goals of care discussion: full code  Family Communication:  wife over the phone on 9 /19 Disposition:  Discharge to home.  Consultants: General surgery , cardiology Procedures:  laparoscopic Right recurrent inguinal hernia repair with mesh, removal of mesh on 9/18  Antibiotics:           Anti-infectives    Start     Dose/Rate Route Frequency Ordered Stop   09/22/16 1530  ceFAZolin (ANCEF) IVPB 2g/100 mL premix     2 g 200 mL/hr over 30 Minutes Intravenous  Once 09/22/16 1522 09/22/16 1606   09/22/16 1523  ceFAZolin (ANCEF) 2-4 GM/100ML-% IVPB    Comments:  Bridget Hartshorn   : cabinet override      09/22/16 1523 09/22/16 1551      Discharge Exam: BP (!) 170/108   Pulse 80   Temp 99.7 F (37.6 C) (Oral)   Resp 20   Ht 6\' 4"  (1.93 m)   Wt 115.3 kg (254 lb 3.1 oz)   SpO2 95%   BMI 30.94 kg/m   General: NAD Cardiovascular: RRR Respiratory: CTABL Ab: incisions c/d/i, no ecchymosis or swelling at R groin site  Discharge Instructions You were cared for by a hospitalist during your hospital stay. If you have any questions about your discharge medications or the care you received while you were in the hospital after you are discharged, you can call the unit and asked to speak with the hospitalist on call if the hospitalist that took care of you is not available. Once you are discharged, your primary care physician will handle any further medical issues. Please note that NO REFILLS for any discharge medications will be authorized once you are discharged, as it is imperative that you return to your primary care physician (or establish a relationship with a primary care physician if you do not have one) for your aftercare needs so that they can reassess your need for medications and  monitor your lab values.  Discharge Instructions    Call MD for:  difficulty breathing, headache or visual disturbances    Complete by:  As directed    Call MD for:  hives    Complete by:  As directed    Call MD for:  persistant nausea and vomiting    Complete by:  As directed    Call MD for:  redness, tenderness, or signs of infection (pain, swelling, redness, odor or green/yellow discharge around incision site)    Complete by:  As directed    Call MD for:  severe uncontrolled pain    Complete by:  As directed    Call MD for:  temperature >100.4    Complete by:  As directed    Diet - low sodium heart healthy    Complete by:  As directed    Diet - low sodium heart healthy    Complete by:  As directed    Discharge instructions    Complete by:  As directed    Ice  packs to groin if swelling, bruising or pain   Discharge wound care:    Complete by:  As directed    Ok to shower tomorrow. Glue will likely peel off in 1-3 weeks. No bandage required   Driving Restrictions    Complete by:  As directed    No driving while on narcotics   Increase activity slowly    Complete by:  As directed    Increase activity slowly    Complete by:  As directed    Increase activity slowly    Complete by:  As directed    Lifting restrictions    Complete by:  As directed    No lifting greater than 20 pounds for 3 weeks     Allergies as of 09/25/2016      Reactions   Ace Inhibitors Cough   Albuterol Other (See Comments)   Racing heart      Medication List    STOP taking these medications   predniSONE 5 MG tablet Commonly known as:  DELTASONE     TAKE these medications   acetaminophen 325 MG tablet Commonly known as:  TYLENOL You can take 2 tablets every 4 hours as need and alternate with the prescribed pain medicine, which also has Tylenol.    DO NOT TAKE MORE THAN 4000 MG OF TYLENOL PER DAY.  IT CAN HARM YOUR LIVER.  TYLENOL (ACETAMINOPHEN) IS ALSO IN YOUR PRESCRIPTION PAIN MEDICATION.  YOU HAVE TO COUNT IT IN YOUR DAILY TOTAL. What changed:  medication strength  how much to take  how to take this  when to take this  additional instructions   amLODipine 5 MG tablet Commonly known as:  NORVASC Take 1 tablet (5 mg total) by mouth daily. What changed:  medication strength  how much to take   carvedilol 25 MG tablet Commonly known as:  COREG Take 1 tablet (25 mg total) by mouth 2 (two) times daily with a meal. What changed:  medication strength  how much to take  when to take this   enoxaparin 120 MG/0.8ML injection Commonly known as:  LOVENOX Inject 0.73 mLs (110 mg total) into the skin every 12 (twelve) hours.   escitalopram 10 MG tablet Commonly known as:  LEXAPRO Take 1 tablet (10 mg total) by mouth daily.   gabapentin 800 MG  tablet Commonly known as:  NEURONTIN TAKE 1 TABLET EVERY DAY WITH DINNER AND AT BEDTIME AS  NEEDED What changed:  See the new instructions.   losartan 100 MG tablet Commonly known as:  COZAAR Take 1 tablet (100 mg total) by mouth daily.   ondansetron 8 MG disintegrating tablet Commonly known as:  ZOFRAN ODT Take 1 tablet (8 mg total) by mouth every 8 (eight) hours as needed for nausea or vomiting.   oxyCODONE-acetaminophen 5-325 MG tablet Commonly known as:  PERCOCET/ROXICET Take 1-2 tablets by mouth every 4 (four) hours as needed for severe pain. What changed:  how much to take   polyethylene glycol packet Commonly known as:  MIRALAX / GLYCOLAX Take 17 g by mouth daily.   senna-docusate 8.6-50 MG tablet Commonly known as:  Senokot-S Take 1 tablet by mouth at bedtime.   spironolactone 25 MG tablet Commonly known as:  ALDACTONE Take 1 tablet (25 mg total) by mouth daily.   tamsulosin 0.4 MG Caps capsule Commonly known as:  FLOMAX Take 0.4 mg by mouth 2 (two) times daily.   warfarin 5 MG tablet Commonly known as:  COUMADIN TAKE 1 TABLET AS DIRECTED BY COUMADIN CLINIC What changed:  See the new instructions.            Discharge Care Instructions        Start     Ordered   09/26/16 0000  escitalopram (LEXAPRO) 10 MG tablet  Daily     09/25/16 0846   09/26/16 0000  spironolactone (ALDACTONE) 25 MG tablet  Daily     09/25/16 0846   09/25/16 0000  amLODipine (NORVASC) 5 MG tablet  Daily     09/25/16 0846   09/25/16 0000  carvedilol (COREG) 25 MG tablet  2 times daily with meals     09/25/16 0846   09/25/16 0000  enoxaparin (LOVENOX) 120 MG/0.8ML injection  Every 12 hours     09/25/16 0846   09/25/16 0000  senna-docusate (SENOKOT-S) 8.6-50 MG tablet  Daily at bedtime     09/25/16 0846   09/25/16 0000  Increase activity slowly     09/25/16 0846   09/25/16 0000  Diet - low sodium heart healthy     09/25/16 0846   09/24/16 0000  polyethylene glycol (MIRALAX /  GLYCOLAX) packet  Daily     09/23/16 1216   09/23/16 0000  oxyCODONE-acetaminophen (PERCOCET/ROXICET) 5-325 MG tablet  Every 4 hours PRN    Question:  Supervising Provider  Answer:  Mickeal Skinner   09/23/16 1008   09/23/16 0000  acetaminophen (TYLENOL) 325 MG tablet    Question:  Supervising Provider  Answer:  Mickeal Skinner   09/23/16 1008   09/23/16 0000  Increase activity slowly     09/23/16 1043   09/23/16 0000  Driving Restrictions    Comments:  No driving while on narcotics   09/23/16 1043   09/23/16 0000  Lifting restrictions    Comments:  No lifting greater than 20 pounds for 3 weeks   09/23/16 1043   09/23/16 0000  Discharge wound care:    Comments:  Ok to shower tomorrow. Glue will likely peel off in 1-3 weeks. No bandage required   09/23/16 1043   09/23/16 0000  Call MD for:  temperature >100.4     09/23/16 1043   09/23/16 0000  Call MD for:  persistant nausea and vomiting     09/23/16 1043   09/23/16 0000  Call MD for:  severe uncontrolled pain     09/23/16 1043   09/23/16 0000  Call MD  for:  redness, tenderness, or signs of infection (pain, swelling, redness, odor or green/yellow discharge around incision site)     09/23/16 1043   09/23/16 0000  Call MD for:  difficulty breathing, headache or visual disturbances     09/23/16 1043   09/23/16 0000  Call MD for:  hives     09/23/16 1043   09/23/16 0000  Discharge instructions    Comments:  Ice packs to groin if swelling, bruising or pain   09/23/16 1043   09/23/16 0000  Increase activity slowly     09/23/16 1216   09/23/16 0000  Diet - low sodium heart healthy     09/23/16 1216     Allergies  Allergen Reactions  . Ace Inhibitors Cough  . Albuterol Other (See Comments)    Racing heart   Follow-up Information    Kinsinger, Arta Bruce, MD Follow up on 10/15/2016.   Specialty:  General Surgery Why:  Your appointment is at 4 PM.  Be at the office 30 minutes early for check in and bring photo ID and   insurance information. Contact information: [place] [address] [phone]        Delilah Shan, MD Follow up in 1 week(s).   Specialty:  Family Medicine Why:  hospital discharge follow up, repeat cbc/bmp at follow up Contact information: [address] [facility] [place] [address] [phone]        please follow up with coumadin clinic on Friday and monday. Follow up.        follow up with urology Follow up.            The results of significant diagnostics from this hospitalization (including imaging, microbiology, ancillary and laboratory) are listed below for reference.    Significant Diagnostic Studies: Ct Abdomen Pelvis Wo Contrast  Result Date: 09/21/2016 CLINICAL DATA:  Right lower quadrant pain history of kidney stones and hernia EXAM: CT ABDOMEN AND PELVIS WITHOUT CONTRAST TECHNIQUE: Multidetector CT imaging of the abdomen and pelvis was performed following the standard protocol without IV contrast. COMPARISON:  09/19/2016 FINDINGS: Lower chest: Dependent atelectasis. No consolidation or effusion. Borderline cardiomegaly. Hepatobiliary: No focal liver abnormality is seen. No gallstones, gallbladder wall thickening, or biliary dilatation. Pancreas: Unremarkable. No pancreatic ductal dilatation or surrounding inflammatory changes. Spleen: Normal in size without focal abnormality. Adrenals/Urinary Tract: Adrenal glands are within normal limits. 11 mm low-density lesion mid left kidney likely a cyst. Moderate right perinephric fat stranding and mild to moderate hydronephrosis and hydroureter. This is secondary to a 5 mm AP x 8 mm craniocaudad stone in the mid right ureter, this is seen at the approximate L4-L5 disc level. There are intrarenal calculi bilaterally. The adrenal glands appear within normal limits. The bladder is unremarkable. Stomach/Bowel: Stomach is nonenlarged. No dilated small bowel. No colon wall  thickening. Colon diverticula without acute inflammation. Vascular/Lymphatic: Aortic atherosclerosis. No enlarged abdominal or pelvic lymph nodes. Reproductive: Prostate is unremarkable. Other: Right inguinal hernia now contains both fat and a loop of fluid-filled small bowel. There is fluid present within the hernia sac, new compared to prior CT as well as mild soft tissue stranding in the hernia sac. Small left fat containing inguinal hernia. Musculoskeletal: Degenerative changes. No acute or suspicious bone lesion. IMPRESSION: 1. Interval change in appearance of the right inguinal hernia, now contains both fat and a loop of fluid-filled small bowel. There also appears to be fluid and soft  tissue stranding within the right hernia sac concerning for incarceration. There is no evidence for a small bowel obstruction related to the hernia. 2. Right perinephric fat stranding and mild to moderate right hydronephrosis and hydroureter, secondary to a 5 mm AP x 8 mm craniocaudad stone in the mid right ureter, not much interval inferior migration since the prior CT. 3. There are bilateral intrarenal calculi 4. Colon diverticula without acute inflammation. Electronically Signed   By: Donavan Foil M.D.   On: 09/21/2016 18:15   Ct Renal Stone Study  Result Date: 09/19/2016 CLINICAL DATA:  RIGHT flank pain beginning this evening. EXAM: CT ABDOMEN AND PELVIS WITHOUT CONTRAST TECHNIQUE: Multidetector CT imaging of the abdomen and pelvis was performed following the standard protocol without IV contrast. COMPARISON:  CT pelvis June 27, 2013, renal ultrasound October 31, 2014 FINDINGS: LOWER CHEST: Bilateral lower lobe atelectasis. Heart size is upper limits of normal. No pericardial effusion. HEPATOBILIARY: Normal. PANCREAS: Normal. SPLEEN: Normal. ADRENALS/URINARY TRACT: Kidneys are orthotopic, demonstrating normal size and morphology. Mild RIGHT hydroureteronephrosis to the mid ureter where a 6 x 3 mm calculus is present. 3  mm RIGHT lower pole, punctate RIGHT interpolar nephrolithiasis. Mild RIGHT perinephric fat stranding. Limited assessment for renal masses on this nonenhanced examination. The unopacified ureters are normal in course and caliber. Urinary bladder is partially distended and unremarkable. 15 mm LEFT adrenal nodule (0 Hounsfield units), compatible with benign adenoma. STOMACH/BOWEL: The stomach, small and large bowel are normal in course and caliber without inflammatory changes, sensitivity decreased by lack of enteric contrast. Air distended moderate duodenal diverticulum. Mild colonic diverticulosis. Colonic intra positioning. The appendix is not discretely identified, however there are no inflammatory changes in the right lower quadrant. VASCULAR/LYMPHATIC: Aortoiliac vessels are normal in course and caliber. Mild calcific atherosclerosis. No lymphadenopathy by CT size criteria. REPRODUCTIVE: Normal. OTHER: No intraperitoneal free fluid or free air. MUSCULOSKELETAL: Non-acute. Moderate RIGHT and small LEFT fat containing inguinal hernias. Old LEFT femur fracture. Old LEFT iliac wing fracture. Mild levoscoliosis. Moderate degenerative change of the lumbar spine. IMPRESSION: 1. 4 x 2 mm RIGHT mid ureteral calculus resulting in mild obstructive uropathy. Residual RIGHT nephrolithiasis measuring to 3 mm. 2. Colonic diverticulosis without acute diverticulitis. Aortic Atherosclerosis (ICD10-I70.0). Electronically Signed   By: Elon Alas M.D.   On: 09/19/2016 02:01    Microbiology: Recent Results (from the past 240 hour(s))  Surgical pcr screen     Status: None   Collection Time: 09/22/16  1:58 PM  Result Value Ref Range Status   MRSA, PCR NEGATIVE NEGATIVE Final   Staphylococcus aureus NEGATIVE NEGATIVE Final    Comment: (NOTE) The Xpert SA Assay (FDA approved for NASAL specimens in patients 7 years of age and older), is one component of a comprehensive surveillance program. It is not intended to  diagnose infection nor to guide or monitor treatment.      Labs: Basic Metabolic Panel:  Recent Labs Lab 09/22/16 0907 09/23/16 0611 09/23/16 1501 09/24/16 0336 09/25/16 0334  NA 141 141 138 139 137  K 3.1* 3.3* 2.8* 3.5 3.6  CL 100* 102 101 104 102  CO2 30 29 23 27 26   GLUCOSE 100* 135* 152* 105* 109*  BUN 12 14 13 15 15   CREATININE 0.75 0.81 1.10 0.83 0.89  CALCIUM 9.3 9.2 9.3 8.8* 9.1  MG 1.9  --   --  2.0  --    Liver Function Tests:  Recent Labs Lab 09/18/16 2321 09/21/16 1436 09/22/16 0907  AST 23  23 21  ALT 15* 18 16*  ALKPHOS 76 75 71  BILITOT 0.8 0.8 1.3*  PROT 7.4 7.8 7.4  ALBUMIN 4.6 5.0 4.4    Recent Labs Lab 09/18/16 2321 09/21/16 1436  LIPASE 36 53*   No results for input(s): AMMONIA in the last 168 hours. CBC:  Recent Labs Lab 09/21/16 1436 09/22/16 0907 09/23/16 0611 09/24/16 0336 09/25/16 0334  WBC 10.1 8.1 10.5 10.6* 11.2*  NEUTROABS 7.5  --   --   --   --   HGB 14.2 13.2 13.4 12.4* 12.8*  HCT 40.6 38.2* 39.1 36.2* 36.5*  MCV 86.4 88.0 87.5 88.1 87.7  PLT 173 177 194 170 171   Cardiac Enzymes: No results for input(s): CKTOTAL, CKMB, CKMBINDEX, TROPONINI in the last 168 hours. BNP: BNP (last 3 results)  Recent Labs  10/23/15 1107  BNP 91.6    ProBNP (last 3 results) No results for input(s): PROBNP in the last 8760 hours.  CBG: No results for input(s): GLUCAP in the last 168 hours.     SignedFlorencia Reasons MD, PhD  [facility] 09/25/2016, 6:41 PM

## 2016-09-23 NOTE — Progress Notes (Signed)
Attempted at 3:08 pm. Patient with nurses. Nurse advised me to come back. Patient possibly being moved to stepdown.

## 2016-09-23 NOTE — Progress Notes (Signed)
Rapid Response Event Note  Overview:  RR RN called to pt room due to pt having SVT rate in the 190s, per EKG obtained by staff nurse. MD at bedside and requested pt be transferred to SD unit for additional monitoring.   Initial Focused Assessment: Pt placed on heart monitor. HR NSR rate 98-105 bpm. Dr. Porfirio Mylar made aware. Oxygen saturation 98% on 3L [place]. BP 131/90. Pt no longer diaphoretic, and AOx4.   Interventions:  MD transferred pt to Brandon [name], Brandon [name], transported pt to Brandon [name] accompanied by nurse tech.   Plan of Care (if not transferred): Pt transferred to 1229  Event Summary: See above.   Brandon [name]

## 2016-09-23 NOTE — Progress Notes (Signed)
Pt was taken to the elevator via wheelchair for discharge and stated he felt "funny" and wanted his pulse checked. Pt was cool, clamy, and reported being dizzy. Pts pulse was irregular on the monitor and pt was taken back to his room and placed on tele with Dr. Erlinda Hong paged at this time to assess pt. Pt continued to feel this way and was placed flat in the bed with oxygen at 3L. Pts IV restarted in the left forearm with magnesium infusing. Pt denies chest pain at this time but states he doesn't feel right. Pt transferred to step down.

## 2016-09-24 ENCOUNTER — Inpatient Hospital Stay (HOSPITAL_COMMUNITY): Payer: Medicare Other

## 2016-09-24 DIAGNOSIS — I471 Supraventricular tachycardia: Secondary | ICD-10-CM

## 2016-09-24 DIAGNOSIS — I119 Hypertensive heart disease without heart failure: Secondary | ICD-10-CM

## 2016-09-24 LAB — CBC
HCT: 36.2 % — ABNORMAL LOW (ref 39.0–52.0)
Hemoglobin: 12.4 g/dL — ABNORMAL LOW (ref 13.0–17.0)
MCH: 30.2 pg (ref 26.0–34.0)
MCHC: 34.3 g/dL (ref 30.0–36.0)
MCV: 88.1 fL (ref 78.0–100.0)
Platelets: 170 10*3/uL (ref 150–400)
RBC: 4.11 MIL/uL — ABNORMAL LOW (ref 4.22–5.81)
RDW: 13.4 % (ref 11.5–15.5)
WBC: 10.6 10*3/uL — ABNORMAL HIGH (ref 4.0–10.5)

## 2016-09-24 LAB — HEMOGLOBIN A1C
Hgb A1c MFr Bld: 5.1 % (ref 4.8–5.6)
Mean Plasma Glucose: 99.67 mg/dL

## 2016-09-24 LAB — BASIC METABOLIC PANEL
Anion gap: 8 (ref 5–15)
BUN: 15 mg/dL (ref 6–20)
CO2: 27 mmol/L (ref 22–32)
Calcium: 8.8 mg/dL — ABNORMAL LOW (ref 8.9–10.3)
Chloride: 104 mmol/L (ref 101–111)
Creatinine, Ser: 0.83 mg/dL (ref 0.61–1.24)
GFR calc Af Amer: >60 mL/min (ref >60)
GFR calc non Af Amer: >60 mL/min (ref >60)
Glucose, Bld: 105 mg/dL — ABNORMAL HIGH (ref 65–99)
Potassium: 3.5 mmol/L (ref 3.5–5.1)
Sodium: 139 mmol/L (ref 135–145)

## 2016-09-24 LAB — PROTIME-INR
INR: 1.29
Prothrombin Time: 16 seconds — ABNORMAL HIGH (ref 11.4–15.2)

## 2016-09-24 LAB — ECHOCARDIOGRAM LIMITED
Height: 76 in
Weight: 3989.44 oz

## 2016-09-24 LAB — MAGNESIUM: Magnesium: 2 mg/dL (ref 1.7–2.4)

## 2016-09-24 MED ORDER — LOSARTAN POTASSIUM 25 MG PO TABS: 25.0000 mg | ORAL_TABLET | Freq: Once | ORAL | Status: DC

## 2016-09-24 MED ORDER — ACETAMINOPHEN 500 MG PO TABS
1000.0000 mg | ORAL_TABLET | Freq: Three times a day (TID) | ORAL | Status: DC
  Administered 2016-09-24 – 2016-09-25 (×4): 1000 mg via ORAL
  Filled 2016-09-24 (×4): qty 2

## 2016-09-24 MED ORDER — SPIRONOLACTONE 25 MG PO TABS
25.0000 mg | ORAL_TABLET | Freq: Every day | ORAL | Status: DC
  Administered 2016-09-24 – 2016-09-25 (×2): 25 mg via ORAL
  Filled 2016-09-24: qty 1

## 2016-09-24 MED ORDER — ENOXAPARIN SODIUM 120 MG/0.8ML ~~LOC~~ SOLN
110.0000 mg | Freq: Two times a day (BID) | SUBCUTANEOUS | Status: DC
  Administered 2016-09-24 – 2016-09-25 (×2): 110 mg via SUBCUTANEOUS
  Filled 2016-09-24 (×3): qty 0.73

## 2016-09-24 MED ORDER — WARFARIN SODIUM 5 MG PO TABS
7.5000 mg | ORAL_TABLET | Freq: Once | ORAL | Status: AC
  Administered 2016-09-24: 7.5 mg via ORAL
  Filled 2016-09-24: qty 1

## 2016-09-24 MED ORDER — LOSARTAN POTASSIUM 25 MG PO TABS
25.0000 mg | ORAL_TABLET | Freq: Every day | ORAL | Status: DC
  Administered 2016-09-24: 25 mg via ORAL
  Filled 2016-09-24 (×2): qty 1

## 2016-09-24 MED ORDER — SENNOSIDES-DOCUSATE SODIUM 8.6-50 MG PO TABS
1.0000 | ORAL_TABLET | Freq: Two times a day (BID) | ORAL | Status: DC
  Administered 2016-09-24 – 2016-09-25 (×3): 1 via ORAL
  Filled 2016-09-24 (×3): qty 1

## 2016-09-24 MED ORDER — LOSARTAN POTASSIUM 50 MG PO TABS
100.0000 mg | ORAL_TABLET | Freq: Every day | ORAL | Status: DC
  Administered 2016-09-25: 100 mg via ORAL
  Filled 2016-09-24: qty 2

## 2016-09-24 MED ORDER — LOSARTAN POTASSIUM 50 MG PO TABS: 50.0000 mg | ORAL_TABLET | Freq: Every day | ORAL | Status: DC

## 2016-09-24 MED ORDER — ESCITALOPRAM OXALATE 20 MG PO TABS
10.0000 mg | ORAL_TABLET | Freq: Every day | ORAL | Status: DC
  Administered 2016-09-24 – 2016-09-25 (×2): 10 mg via ORAL
  Filled 2016-09-24 (×2): qty 1

## 2016-09-24 MED ORDER — POTASSIUM CHLORIDE CRYS ER 20 MEQ PO TBCR
40.0000 meq | EXTENDED_RELEASE_TABLET | Freq: Once | ORAL | Status: AC
  Administered 2016-09-24: 40 meq via ORAL
  Filled 2016-09-24: qty 2

## 2016-09-24 MED ORDER — CARVEDILOL 12.5 MG PO TABS
25.0000 mg | ORAL_TABLET | Freq: Two times a day (BID) | ORAL | Status: DC
  Administered 2016-09-24 – 2016-09-25 (×2): 25 mg via ORAL
  Filled 2016-09-24 (×2): qty 2

## 2016-09-24 MED ORDER — CARVEDILOL 12.5 MG PO TABS
12.5000 mg | ORAL_TABLET | Freq: Two times a day (BID) | ORAL | Status: AC
  Administered 2016-09-24: 12.5 mg via ORAL
  Filled 2016-09-24: qty 1

## 2016-09-24 MED ORDER — OXYCODONE HCL 5 MG PO TABS
5.0000 mg | ORAL_TABLET | ORAL | Status: DC | PRN
  Administered 2016-09-24 – 2016-09-25 (×5): 10 mg via ORAL
  Filled 2016-09-24 (×5): qty 2

## 2016-09-24 MED ORDER — LOSARTAN POTASSIUM 50 MG PO TABS
75.0000 mg | ORAL_TABLET | Freq: Once | ORAL | Status: AC
  Administered 2016-09-24: 75 mg via ORAL
  Filled 2016-09-24 (×2): qty 1

## 2016-09-24 MED ORDER — HYDROMORPHONE HCL 1 MG/ML IJ SOLN
1.0000 mg | Freq: Once | INTRAMUSCULAR | Status: AC
  Administered 2016-09-24: 1 mg via INTRAVENOUS
  Filled 2016-09-24: qty 1

## 2016-09-24 MED ORDER — HYDROMORPHONE HCL-NACL 0.5-0.9 MG/ML-% IV SOSY
0.5000 mg | PREFILLED_SYRINGE | INTRAVENOUS | Status: DC | PRN
  Administered 2016-09-24 – 2016-09-25 (×4): 0.5 mg via INTRAVENOUS
  Filled 2016-09-24 (×4): qty 1

## 2016-09-24 NOTE — Progress Notes (Signed)
2 Days Post-Op    CC:  RIH incarcerated hernia  Subjective: He feels terrible this AM.  Sore, says he is clammy.  He does not know if he has had any pain meds and over all not very happy.  Objective: Vital signs in last 24 hours: Temp:  [97.9 F (36.6 C)-98.4 F (36.9 C)] 98.3 F (36.8 C) (09/20 0700) Pulse Rate:  [67-185] 83 (09/20 0600) Resp:  [8-19] 8 (09/20 0600) BP: (74-159)/(38-112) 154/112 (09/20 0600) SpO2:  [90 %-100 %] 94 % (09/20 0600) Weight:  [113.1 kg (249 lb 5.4 oz)] 113.1 kg (249 lb 5.4 oz) (09/20 0500) Last BM Date: 09/21/16 240 PO  4000 IV 1900 urine Afebrile, BP up     Intake/Output from previous day: 09/19 0701 - 09/20 0700 In: 4227.5 [P.O.:240; I.V.:2637.5; IV Piggyback:1350] Out: 1900 [Urine:1900] Intake/Output this shift: No intake/output data recorded.  General appearance: alert, cooperative and no distress GI: port sites look fine.    Lab Results:   Recent Labs  09/23/16 0611 09/24/16 0336  WBC 10.5 10.6*  HGB 13.4 12.4*  HCT 39.1 36.2*  PLT 194 170    BMET  Recent Labs  09/23/16 1501 09/24/16 0336  NA 138 139  K 2.8* 3.5  CL 101 104  CO2 23 27  GLUCOSE 152* 105*  BUN 13 15  CREATININE 1.10 0.83  CALCIUM 9.3 8.8*   PT/INR  Recent Labs  09/23/16 0611 09/24/16 0336  LABPROT 14.7 16.0*  INR 1.16 1.29     Recent Labs Lab 09/18/16 2321 09/21/16 1436 09/22/16 0907  AST 23 23 21   ALT 15* 18 16*  ALKPHOS 76 75 71  BILITOT 0.8 0.8 1.3*  PROT 7.4 7.8 7.4  ALBUMIN 4.6 5.0 4.4     Lipase     Component Value Date/Time   LIPASE 53 (H) 09/21/2016 1436     Medications: . carvedilol  12.5 mg Oral BID WC  . Chlorhexidine Gluconate Cloth  6 each Topical Q0600  . enoxaparin (LOVENOX) injection  120 mg Subcutaneous Q12H  . escitalopram  10 mg Oral Daily  . polyethylene glycol  17 g Oral Daily  . potassium chloride  40 mEq Oral Once  . senna-docusate  1 tablet Oral BID  . tamsulosin  0.4 mg Oral BID  . Warfarin  - Pharmacist Dosing Inpatient   Does not apply q1800    Assessment/Plan  Incarcerated RIH S/p RIH repair - open 09/22/16, Dr. Lurena Joiner Kinsinger  POD 2 SVT post op Hypokalemia - replaced Nephrolithiasis AF and Hx of PE on coumadin INR 1.29 - Dr. Doylene Canard Nephrolithiais Hx of femur repair OSA RLS FEN:  IV fluids/cardiac diet ID:  Ancef pre op DVT: SCD/Lovenox   Pain Dilaudid 0.5 x 4 Oxycodone 5 mg x 1 yesterday and this AM 0400  Plan:  I will give him a more reasonable pain care plan, and see if we can get him comfortable.  Defer Cardiac Rx to Medicine.      LOS: 3 days    Owens Hara 09/24/2016 [phone]

## 2016-09-24 NOTE — Progress Notes (Signed)
ANTICOAGULATION CONSULT NOTE - Follow Up Consult  Pharmacy Consult for warfarin, Lovenox bridge Indication: afib, PE/DVT, LA thrombus  Patient Measurements: Height: 6\' 4"  (193 cm) Weight: 249 lb 5.4 oz (113.1 kg) IBW/kg (Calculated) : 86.8  Vital Signs: Temp: 98.3 F (36.8 C) (09/20 0700) Temp Source: Oral (09/20 0700) BP: 147/90 (09/20 1100) Pulse Rate: 81 (09/20 1100)  Labs:  Recent Labs  09/22/16 0907 09/23/16 0611 09/23/16 1501 09/24/16 0336  HGB 13.2 13.4  --  12.4*  HCT 38.2* 39.1  --  36.2*  PLT 177 194  --  170  APTT  --  27  --   --   LABPROT 14.8  15.1 14.7  --  16.0*  INR 1.17  1.20 1.16  --  1.29  CREATININE 0.75 0.81 1.10 0.83    Estimated Creatinine Clearance: 117.2 mL/min (by C-G formula based on SCr of 0.83 mg/dL).  Assessment: 73 yoM presented on 9/17 with incarcerated hernia s/p laproscopic right inguinal hernia repair on 9/18.  PMH is significant for chronic warfarin anticoagulation for hx of Afib s/p cardioversion, PE DVT, and LA thrombus.  Pharmacy consulted to resume warfarin dosing with Lovenox bridge.  PTA Warfarin: 5 mg daily except 7.5 mg on Tues/Thursdays. Admission INR 3.61 Reversed on 9/17 with Vit K 10mg  IV and FFP x4  Today, 09/24/2016:  INR 1.29  CBC: Hgb 12.4 and Plt 170  No bleeding or surgical complications reported. Transferred to ICU-SD yesterday for episode of prolonged SVT, cardiology consulted.  Drug-drug interactions: expect that IV vit K given 9/17 may suppress INR for several days.  First dose of Lovenox refused last night. Patient received dose this morning (POD#2).  Goal of Therapy:  INR 2-3 Anti-Xa level 0.6-1 units/ml 4hrs after LMWH dose given Monitor platelets by anticoagulation protocol: Yes   Plan:   Continue Lovenox 1 mg/kg SQ BID, but adjust dose of 120 mg to 110 mg due to correction in weight to 113 kg.    Warfarin 7.5 mg x 1 tonight.  Daily PT/INR.  Monitor for signs and symptoms of  bleeding.  Patient has follow ups with [facility] on Friday, 9/21 and Monday, 9/24 per MD.   Hershal Coria, PharmD, BCPS Pager: [phone] 09/24/2016 11:12 AM

## 2016-09-24 NOTE — Progress Notes (Signed)
  Echocardiogram 2D Echocardiogram has been performed. Patient recent hernia surgery. Complained of pain and inability to lay in LLD completely. Technically difficult parasternal views.   Hubbert Landrigan L Androw 09/24/2016, 8:49 AM

## 2016-09-24 NOTE — Progress Notes (Signed)
PROGRESS NOTE  Brandon B Allcorn Jr. SNK:[id number] DOB: [birth year] DOA: 09/21/2016 PCP: Delilah Shan, MD  HPI/Recap of past 24 hours:  Heart rate remain in sinus rhythm overnight His blood pressure start to increase  He is irritated, report not feeling good He wants his lexapro restarted, he run out of lexapro 5weeks ago, states his pmd did not give him refill, he is in the process of changing pmd.   Assessment/Plan: Principal Problem:   Incarcerated hernia of abdominal cavity Active Problems:   Hypertensive heart disease   History of pulmonary embolism   Obstructive sleep apnea   Paroxysmal atrial fibrillation (HCC)   SVT (supraventricular tachycardia) (HCC)   Obesity (BMI 30-39.9)  SVT: heart rate of 160's Sudden onset of SVT while getting down stairs for discharge on 9/19, Discharge cancelled.  He is feeling dizzy and sweaty, denies chest pain, no hypoxia,   First episode of svt converted back to sinus rhythm with pvc's without needing adenosine. patient went back to svt when he arrived to icu/stepdown, heart rate in 180's,  His blood pressure dropped into the 70's, he is diaphoretic. He remained in SVT after adenosine 6mg  x2, he converted back to sinus tachycardia after 12mg  adenosine iv. Ns iliter bolus and potassium supplement provided, STAT cardiology consulted, case discussed with Dr Cathie Olden. echopending.   Hypokalemia. Replace k,  mag 2  Essential hypertension. Home meds Aldactone, losartan, carvedilol and amlodipine held initially due to npo,  Coreg resumed on 9/19, blood pressure start to increase, increase coreg to 25mg  bid, start losartan at a lower dose   H/o  A. fib  History of DVT PE Left atrial thrombus. Negative stress test in March 2018. Chronic anticoagulation with Coumadin. Discussed with hematology as well as pharmacy. With history of thrombosis and clot,giving Eppie Gibson would be more risky and he received FFP and vitamin K for reversal prior to  surgery. He is restarted on coumadin with lovenox bridging      Incarcerated right inguinal hernia. Coumadin held since admission, Due to history of PE K Jaclyn Prime was not used. Patient received 4 FFP and 10 mg of vitamin K. He underwent laparoscopic Rightrecurrentinguinal hernia repair with mesh, removal of mesh on 9/18, general surgery oked with resuming coumadin with lovenox bridging 24hrs post op.    Restless leg syndrome.Patient on gabapentin at home.  Body mass index is 30.35 kg/m. Obstructive sleep apnea, he report he could not tolerate cpap    Nephrolithiasis with right mild hydronephrosis. Patient was seen in the ED on 9/15 for kidney stone and referred to see urology on 9/17, he developed abdominal pain while at urology's office, he was sent to the ED and found to have incarcerated hernia ua on admission, no blood He is to follow with urology  as an outpatient  H/o alcohol use: report quit in 06/2016  Depression/anxiety: report has been on lexapro for the last 47yrs, he run out of lexapro the last 5weeks, restart lexapro.   DVT Prophylaxis chronic anticoagulation with Coumadin  Advance goals of care discussion:full code  Family Communication:wife (seperated) over the phone on 9/19   Disposition: Discharge cancelled, transfer to stepdown due to svt  Consultants:General surgery, cardiology  Procedures:  laparoscopic Rightrecurrentinguinal hernia repair with mesh, removal of mesh on 9/18  Antibiotics:                            Anti-infectives   Start   Dose/Rate Route  Frequency Ordered Stop   09/22/16 1530  ceFAZolin (ANCEF) IVPB 2g/100 mL premix   2 g 200 mL/hr over 30 Minutes Intravenous Once 09/22/16 1522 09/22/16 1606   09/22/16 1523  ceFAZolin (ANCEF) 2-4 GM/100ML-% IVPB  Comments: Bridget Hartshorn : cabinet override      09/22/16 1523 09/22/16 1551       Objective: BP (!) 154/112   Pulse 83   Temp  98.3 F (36.8 C) (Oral)   Resp (!) 8   Ht 6\' 4"  (1.93 m)   Wt 113.1 kg (249 lb 5.4 oz)   SpO2 94%   BMI 30.35 kg/m   Intake/Output Summary (Last 24 hours) at 09/24/16 0803 Last data filed at 09/24/16 0326  Gross per 24 hour  Intake           4227.5 ml  Output             1900 ml  Net           2327.5 ml   Filed Weights   09/21/16 1415 09/24/16 0500  Weight: 122.5 kg (270 lb) 113.1 kg (249 lb 5.4 oz)    Exam: Patient is examined daily including today on 09/24/2016, exams remain the same as of yesterday except that has changed    General:  Does not seem to be comfortable  Cardiovascular: tachycardia  Respiratory: CTABL  Abdomen: incision C/D/I, Soft/ND/NT, positive BS  Musculoskeletal: No Edema  Neuro: alert, oriented   Data Reviewed: Basic Metabolic Panel:  Recent Labs Lab 09/21/16 1436 09/22/16 0907 09/23/16 0611 09/23/16 1501 09/24/16 0336  NA 137 141 141 138 139  K 3.1* 3.1* 3.3* 2.8* 3.5  CL 98* 100* 102 101 104  CO2 28 30 29 23 27   GLUCOSE 141* 100* 135* 152* 105*  BUN 15 12 14 13 15   CREATININE 0.86 0.75 0.81 1.10 0.83  CALCIUM 9.5 9.3 9.2 9.3 8.8*  MG  --  1.9  --   --  2.0   Liver Function Tests:  Recent Labs Lab 09/18/16 2321 09/21/16 1436 09/22/16 0907  AST 23 23 21   ALT 15* 18 16*  ALKPHOS 76 75 71  BILITOT 0.8 0.8 1.3*  PROT 7.4 7.8 7.4  ALBUMIN 4.6 5.0 4.4    Recent Labs Lab 09/18/16 2321 09/21/16 1436  LIPASE 36 53*   No results for input(s): AMMONIA in the last 168 hours. CBC:  Recent Labs Lab 09/18/16 2321 09/21/16 1436 09/22/16 0907 09/23/16 0611 09/24/16 0336  WBC 7.1 10.1 8.1 10.5 10.6*  NEUTROABS  --  7.5  --   --   --   HGB 13.9 14.2 13.2 13.4 12.4*  HCT 40.9 40.6 38.2* 39.1 36.2*  MCV 88.0 86.4 88.0 87.5 88.1  PLT 190 173 177 194 170   Cardiac Enzymes:   No results for input(s): CKTOTAL, CKMB, CKMBINDEX, TROPONINI in the last 168 hours. BNP (last 3 results)  Recent Labs  10/23/15 1107  BNP 91.6      ProBNP (last 3 results) No results for input(s): PROBNP in the last 8760 hours.  CBG: No results for input(s): GLUCAP in the last 168 hours.  Recent Results (from the past 240 hour(s))  Surgical pcr screen     Status: None   Collection Time: 09/22/16  1:58 PM  Result Value Ref Range Status   MRSA, PCR NEGATIVE NEGATIVE Final   Staphylococcus aureus NEGATIVE NEGATIVE Final    Comment: (NOTE) The Xpert SA Assay (FDA approved for NASAL specimens in  patients 69 years of age and older), is one component of a comprehensive surveillance program. It is not intended to diagnose infection nor to guide or monitor treatment.      Studies: No results found.  Scheduled Meds: . carvedilol  25 mg Oral BID WC  . Chlorhexidine Gluconate Cloth  6 each Topical Q0600  . enoxaparin (LOVENOX) injection  120 mg Subcutaneous Q12H  . escitalopram  10 mg Oral Daily  . losartan  25 mg Oral Daily  . polyethylene glycol  17 g Oral Daily  . potassium chloride  40 mEq Oral Once  . senna-docusate  1 tablet Oral BID  . tamsulosin  0.4 mg Oral BID  . Warfarin - Pharmacist Dosing Inpatient   Does not apply q1800    Continuous Infusions: . sodium chloride      Time spent: 6mins  I have personally reviewed and interpreted on  09/24/2016  daily labs, tele strips, imagings as discussed above under data review session and assessment and plans.  I reviewed all nursing notes, pharmacy notes, consultant notes,  vitals, pertinent old records  I have discussed plan of care as described above with RN , patient on 09/24/2016   Kaiya Boatman MD, PhD  [facility] Pager [phone]. If 7PM-7AM, please contact night-coverage at [email or web address], password [facility] 09/24/2016, 8:03 AM  LOS: 3 days

## 2016-09-24 NOTE — Progress Notes (Signed)
Progress Note  Patient Name: Brandon [name]. Date of Encounter: 09/24/2016  Primary Cardiologist: Dr. Rayann Heman  Subjective   Pt states he is feeling "lousy" today. His hernia surgery site is sore and he feels constipated. He denies palpitations, chest discomfort, or shortness of breath. He is concerned that his blood pressure is elevated.  Inpatient Medications    Scheduled Meds: . acetaminophen  1,000 mg Oral Q8H  . carvedilol  25 mg Oral BID WC  . Chlorhexidine Gluconate Cloth  6 each Topical Q0600  . enoxaparin (LOVENOX) injection  120 mg Subcutaneous Q12H  . escitalopram  10 mg Oral Daily  . losartan  25 mg Oral Daily  . polyethylene glycol  17 g Oral Daily  . senna-docusate  1 tablet Oral BID  . tamsulosin  0.4 mg Oral BID  . Warfarin - Pharmacist Dosing Inpatient   Does not apply q1800   Continuous Infusions: . sodium chloride     PRN Meds: acetaminophen **OR** acetaminophen, hydrALAZINE, HYDROmorphone (DILAUDID) injection, LORazepam, ondansetron **OR** ondansetron (ZOFRAN) IV, oxyCODONE   Vital Signs    Vitals:   09/24/16 0600 09/24/16 0700 09/24/16 0800 09/24/16 0900  BP: (!) 154/112 (!) 161/103 (!) 177/121 (!) 161/113  Pulse: 83 79 88 81  Resp: (!) 8 (!) 8 16 14   Temp:  98.3 F (36.8 C)    TempSrc:  Oral    SpO2: 94% 96% 96% 95%  Weight:      Height:        Intake/Output Summary (Last 24 hours) at 09/24/16 1030 Last data filed at 09/24/16 0813  Gross per 24 hour  Intake           4137.5 ml  Output             2225 ml  Net           1912.5 ml   Filed Weights   09/21/16 1415 09/24/16 0500  Weight: 270 lb (122.5 kg) 249 lb 5.4 oz (113.1 kg)    Telemetry    Sinus rhythm with occasional PVCs and rare bigeminy he had 15 beats of SVT this morning at 06 32. - Personally Reviewed  ECG    Sinus rhythm at 77 bpm with Unusual P axis, possible ectopic atrial rhythm, Left anterior fascicular block - Personally Reviewed  Physical Exam  BP (!) 147/90    Pulse 81   Temp 98.3 F (36.8 C) (Oral)   Resp 10   Ht 6\' 4"  (1.93 m)   Wt 249 lb 5.4 oz (113.1 kg)   SpO2 96%   BMI 30.35 kg/m   GEN: No acute distress.   Neck: No JVD Cardiac: RRR, no murmurs, rubs, or gallops.  Respiratory: Clear to auscultation bilaterally. GI: Soft, nontender, non-distended  MS: No edema; No deformity. Neuro:  Nonfocal  Psych: Normal affect   Labs    Chemistry Recent Labs Lab 09/18/16 2321 09/21/16 1436 09/22/16 0907 09/23/16 0611 09/23/16 1501 09/24/16 0336  NA 139 137 141 141 138 139  K 3.7 3.1* 3.1* 3.3* 2.8* 3.5  CL 102 98* 100* 102 101 104  CO2 25 28 30 29 23 27   GLUCOSE 158* 141* 100* 135* 152* 105*  BUN 22* 15 12 14 13 15   CREATININE 1.12 0.86 0.75 0.81 1.10 0.83  CALCIUM 9.6 9.5 9.3 9.2 9.3 8.8*  PROT 7.4 7.8 7.4  --   --   --   ALBUMIN 4.6 5.0 4.4  --   --   --  AST 23 23 21   --   --   --   ALT 15* 18 16*  --   --   --   ALKPHOS 76 75 71  --   --   --   BILITOT 0.8 0.8 1.3*  --   --   --   GFRNONAA >60 >60 >60 >60 >60 >60  GFRAA >60 >60 >60 >60 >60 >60  ANIONGAP 12 11 11 10 14 8      Hematology Recent Labs Lab 09/22/16 0907 09/23/16 0611 09/24/16 0336  WBC 8.1 10.5 10.6*  RBC 4.34 4.47 4.11*  HGB 13.2 13.4 12.4*  HCT 38.2* 39.1 36.2*  MCV 88.0 87.5 88.1  MCH 30.4 30.0 30.2  MCHC 34.6 34.3 34.3  RDW 13.3 13.3 13.4  PLT 177 194 170    Cardiac EnzymesNo results for input(s): TROPONINI in the last 168 hours. No results for input(s): TROPIPOC in the last 168 hours.   BNPNo results for input(s): BNP, PROBNP in the last 168 hours.   DDimer No results for input(s): DDIMER in the last 168 hours.   Radiology    No results found.  Cardiac Studies   03/12/2016  Nuclear stress EF: 53%.  There was no ST segment deviation noted during stress.  Defect 1: There is a large defect of severe severity present in the basal inferolateral, mid inferolateral and apex location.  This is a low risk study.  The left ventricular  ejection fraction is mildly decreased (45-54%).   Low risk stress nuclear study with large prior inferolateral infarct and mild peri-infarct ischemia; EF 53 with inferolateral hypokinesis; mild LVE.  Echocardiogram 03/08/2016 Study Conclusions - Left ventricle: The cavity size was normal. Wall thickness was   increased in a pattern of moderate LVH. Systolic function was   normal. The estimated ejection fraction was in the range of 50%   to 55%. Wall motion was normal; there were no regional wall   motion abnormalities. Doppler parameters are consistent with   abnormal left ventricular relaxation (grade 1 diastolic   dysfunction). - Right atrium: The atrium was mildly dilated.   Patient Profile     66 y.o. male with a history of hypertension, supraventricular tachycardia, paroxysmal atrial fibrillation, hyperlipidemia. He was recently admitted on 9/17  for hernia repair. He has a history of SVT and has had SVT ablation in the past. Potassium was 2.8 on presentation yesterday.  Assessment & Plan    Supraventricular tachycardia -History of SVT and SVT ablation in the past. The patient had continued to have some SVT episodes but declined a second ablation as his episodes were not prolonged or frequent. Patient had SVT yesterday in the setting of hypokalemia. He received 2 doses of adenosine 6 mg with continued SVT and then a dose of 12 mg that converted him to sinus rhythm. -It is suspected that the patient developed SVT following a premature ventricular contraction in the setting of hypokalemia. He also may have missed some of his carvedilol doses when he was nothing by mouth surrounding his hernia repair. -Currently maintaining sinus rhythm with occasional PVCs and had 15 beats of SVT this morning at 06 32 -Carvedilol has been increased to 25 mg twice a day for better AV nodal blocking, however the patient received no carvedilol this morning. We'll give a dose of 12.5 mg now. Blood pressure is  elevated and will need to see his blood pressures, on the increased carvedilol. Could consider switching carvedilol to metoprolol if blood  pressure becomes too low. -Echocardiogram is in process  Essential hypertension -Patient was treated outpatient with carvedilol 12.5 mg, however he was only taking it once a day due to marginally low blood pressures. He was on high-dose amlodipine and high-dose losartan. -Amlodipine is not ordered in the hospital. Carvedilol was discontinued at 12.5 mg twice daily, however he has not received a dose this morning and this evening's dose is increased to 25 mg. Blood pressure is high today. Losartan is continued at 25 mg daily. Amlodipine has not been reordered. -We'll need to assess the patient's response to the increased dose of carvedilol  Hypokalemia -Potassium was 2.8 on admission. Receiving potassium replacement. Today potassium level is 3.5  For questions or updates, please contact [place] Please consult [email or web address] for contact info under Cardiology/STEMI.      Signed, Daune Perch, NP  09/24/2016, 10:30 AM    Attending Note:   The patient was seen and examined.  Agree with assessment and plan as noted above.  Changes made to the above note as needed.  Patient seen and independently examined with Pecolia Ades, NP.   We discussed all aspects of the encounter. I agree with the assessment and plan as stated above.  1.  SVT:   Seem better.   He will need a higher dose of beta blocker .  We will increase up to coreg 25 mg BID.  2. Essential HTN :   BP is very high today .   Was low yesterday BP is back up today - likely because he is eating more normally Will restart home dose of Losartan - 100 mg a day Restart and increase aldactone at 25 mg a day  Have increased Coreg to 25 BID  May need addition of amlodipine tomorrow    I have spent a total of 40 minutes with patient reviewing hospital  notes , telemetry, EKGs, labs and examining  patient as well as establishing an assessment and plan that was discussed with the patient. > 50% of time was spent in direct patient care.    Thayer Headings, [name], MD, [facility] 09/24/2016, 12:06 PM [address]  [place] Pager [phone]

## 2016-09-25 LAB — BASIC METABOLIC PANEL
Anion gap: 9 (ref 5–15)
BUN: 15 mg/dL (ref 6–20)
CO2: 26 mmol/L (ref 22–32)
Calcium: 9.1 mg/dL (ref 8.9–10.3)
Chloride: 102 mmol/L (ref 101–111)
Creatinine, Ser: 0.89 mg/dL (ref 0.61–1.24)
GFR calc Af Amer: >60 mL/min (ref >60)
GFR calc non Af Amer: >60 mL/min (ref >60)
Glucose, Bld: 109 mg/dL — ABNORMAL HIGH (ref 65–99)
Potassium: 3.6 mmol/L (ref 3.5–5.1)
Sodium: 137 mmol/L (ref 135–145)

## 2016-09-25 LAB — CBC
HCT: 36.5 % — ABNORMAL LOW (ref 39.0–52.0)
Hemoglobin: 12.8 g/dL — ABNORMAL LOW (ref 13.0–17.0)
MCH: 30.8 pg (ref 26.0–34.0)
MCHC: 35.1 g/dL (ref 30.0–36.0)
MCV: 87.7 fL (ref 78.0–100.0)
Platelets: 171 10*3/uL (ref 150–400)
RBC: 4.16 MIL/uL — ABNORMAL LOW (ref 4.22–5.81)
RDW: 13.4 % (ref 11.5–15.5)
WBC: 11.2 10*3/uL — ABNORMAL HIGH (ref 4.0–10.5)

## 2016-09-25 LAB — PROTIME-INR
INR: 1.68
Prothrombin Time: 19.7 seconds — ABNORMAL HIGH (ref 11.4–15.2)

## 2016-09-25 MED ORDER — AMLODIPINE BESYLATE 5 MG PO TABS: 5.0000 mg | ORAL_TABLET | Freq: Every day | ORAL | 0 refills | Status: DC

## 2016-09-25 MED ORDER — SPIRONOLACTONE 25 MG PO TABS
25.0000 mg | ORAL_TABLET | Freq: Every day | ORAL | 0 refills | Status: DC
Start: 2016-09-26 — End: 2016-10-26

## 2016-09-25 MED ORDER — POTASSIUM CHLORIDE CRYS ER 20 MEQ PO TBCR
40.0000 meq | EXTENDED_RELEASE_TABLET | Freq: Once | ORAL | Status: AC
  Administered 2016-09-25: 40 meq via ORAL
  Filled 2016-09-25: qty 2

## 2016-09-25 MED ORDER — CARVEDILOL 25 MG PO TABS: 25.0000 mg | ORAL_TABLET | Freq: Two times a day (BID) | ORAL | 0 refills | Status: DC

## 2016-09-25 MED ORDER — ESCITALOPRAM OXALATE 10 MG PO TABS: 10.0000 mg | ORAL_TABLET | Freq: Every day | ORAL | 0 refills | Status: DC

## 2016-09-25 MED ORDER — ENOXAPARIN SODIUM 120 MG/0.8ML ~~LOC~~ SOLN: 110.0000 mg | Freq: Two times a day (BID) | SUBCUTANEOUS | 0 refills | Status: DC

## 2016-09-25 MED ORDER — AMLODIPINE BESYLATE 5 MG PO TABS
5.0000 mg | ORAL_TABLET | Freq: Every day | ORAL | Status: DC
  Administered 2016-09-25: 5 mg via ORAL
  Filled 2016-09-25: qty 1

## 2016-09-25 MED ORDER — SENNOSIDES-DOCUSATE SODIUM 8.6-50 MG PO TABS: 1.0000 | ORAL_TABLET | Freq: Every day | ORAL | 0 refills | Status: DC

## 2016-09-25 NOTE — Care Management Note (Signed)
Case Management Note  Patient Details  Name: Brandon [name]. MRN: [id number] Date of Birth: 1950-11-04  Subjective/Objective:                  Elevated bp and svt   Action/Plan: September 24, 2016 Velva Harman, BSN, [place],  [place] Chart reviewed for case management and discharge needs. Next review date: [id number]  Expected Discharge Date:  09/23/16               Expected Discharge Plan:  Home/Self Care  In-House Referral:     Discharge planning Services  CM Consult  Post Acute Care Choice:    Choice offered to:     DME Arranged:    DME Agency:     HH Arranged:    HH Agency:     Status of Service:  Completed, signed off  If discussed at [employer] of Stay Meetings, dates discussed:    Additional Comments:  Leeroy Cha, RN 09/25/2016, 8:22 AM

## 2016-09-25 NOTE — Progress Notes (Signed)
September 24, 2016 Chart and discharge orders researched for Case Management needs. None found and patient discharged to appropriate level of care. Patient and family have no further questions. Velva Harman, BSN, [place], [place].

## 2016-09-25 NOTE — Progress Notes (Signed)
Pt education completed to include future appointments, current prescriptions and medications, and doctors discharge instructions. Pt alert and oriented, vital signs stable. Pt discharged via wheel chair with nursing staff. Temp: 99.7 F (37.6 C) (09/21 0800) Temp Source: Oral (09/21 0800) BP: 170/108 (09/21 0800) Pulse Rate: 80 (09/21 0800)  Wray Kearns 09/25/2016 11:57 AM

## 2016-09-25 NOTE — Progress Notes (Signed)
3 Days Post-Op    JX:[id number] RIH  Subjective: He looks good today, BP is still an issue.  He is doing much better with pain control.  Sites all look good.  Objective: Vital signs in last 24 hours: Temp:  [97.7 F (36.5 C)-98.5 F (36.9 C)] 97.7 F (36.5 C) (09/21 0349) Pulse Rate:  [62-85] 74 (09/21 0700) Resp:  [10-26] 15 (09/21 0700) BP: (123-163)/(86-120) 163/112 (09/21 0700) SpO2:  [91 %-98 %] 94 % (09/21 0700) Weight:  [115.3 kg (254 lb 3.1 oz)] 115.3 kg (254 lb 3.1 oz) (09/21 0349) Last BM Date: 09/24/16 Nothing PO reported Urine 875 Stool x 1 Afebrile, BP still up, Heart rate is better Labs OK Intake/Output from previous day: 09/20 0701 - 09/21 0700 In: 150 [I.V.:150] Out: 875 [Urine:875] Intake/Output this shift: No intake/output data recorded.  General appearance: alert, cooperative and no distress Resp: clear to auscultation bilaterally GI: soft, sore, sites all look good.  Lab Results:   Recent Labs  09/24/16 0336 09/25/16 0334  WBC 10.6* 11.2*  HGB 12.4* 12.8*  HCT 36.2* 36.5*  PLT 170 171    BMET  Recent Labs  09/24/16 0336 09/25/16 0334  NA 139 137  K 3.5 3.6  CL 104 102  CO2 27 26  GLUCOSE 105* 109*  BUN 15 15  CREATININE 0.83 0.89  CALCIUM 8.8* 9.1   PT/INR  Recent Labs  09/24/16 0336 09/25/16 0334  LABPROT 16.0* 19.7*  INR 1.29 1.68     Recent Labs Lab 09/18/16 2321 09/21/16 1436 09/22/16 0907  AST 23 23 21   ALT 15* 18 16*  ALKPHOS 76 75 71  BILITOT 0.8 0.8 1.3*  PROT 7.4 7.8 7.4  ALBUMIN 4.6 5.0 4.4     Lipase     Component Value Date/Time   LIPASE 53 (H) 09/21/2016 1436     Medications: . acetaminophen  1,000 mg Oral Q8H  . carvedilol  25 mg Oral BID WC  . Chlorhexidine Gluconate Cloth  6 each Topical Q0600  . enoxaparin (LOVENOX) injection  110 mg Subcutaneous Q12H  . escitalopram  10 mg Oral Daily  . losartan  100 mg Oral Daily  . polyethylene glycol  17 g Oral Daily  . senna-docusate  1  tablet Oral BID  . spironolactone  25 mg Oral Daily  . tamsulosin  0.4 mg Oral BID  . Warfarin - Pharmacist Dosing Inpatient   Does not apply q1800    Assessment/Plan Incarcerated RIH S/p RIH repair - open 09/22/16, Dr. Lurena Joiner Kinsinger POD 2 SVT post op Hypokalemia - replaced Nephrolithiasis AF and Hx of PE on coumadin INR 1.29 - Dr. Doylene Canard Nephrolithiais Hx of femur repair OSA RLS FEN: IV fluids/cardiac diet ID: Ancef pre op DVT: SCD/Lovenox    Plan:  Discharge per Medicine. Our instruction are the same.     LOS: 4 days    Sharlynn Seckinger 09/25/2016 [phone]

## 2016-09-25 NOTE — Progress Notes (Signed)
Progress Note  Patient Name: Brandon [name]. Date of Encounter: 09/25/2016  Primary Cardiologist: allred   Subjective   66 year old gentleman with history of hypertension, supraventricular tachycardia, praxis for atrial fibrillation.  We're asked to see him for an episode of SVT following hernia repair.  Inpatient Medications    Scheduled Meds: . acetaminophen  1,000 mg Oral Q8H  . carvedilol  25 mg Oral BID WC  . Chlorhexidine Gluconate Cloth  6 each Topical Q0600  . enoxaparin (LOVENOX) injection  110 mg Subcutaneous Q12H  . escitalopram  10 mg Oral Daily  . losartan  100 mg Oral Daily  . polyethylene glycol  17 g Oral Daily  . senna-docusate  1 tablet Oral BID  . spironolactone  25 mg Oral Daily  . tamsulosin  0.4 mg Oral BID  . Warfarin - Pharmacist Dosing Inpatient   Does not apply q1800   Continuous Infusions: . sodium chloride     PRN Meds: acetaminophen **OR** acetaminophen, hydrALAZINE, HYDROmorphone (DILAUDID) injection, LORazepam, ondansetron **OR** ondansetron (ZOFRAN) IV, oxyCODONE   Vital Signs    Vitals:   09/25/16 0349 09/25/16 0400 09/25/16 0600 09/25/16 0700  BP:  (!) 151/96 (!) 149/112 (!) 163/112  Pulse:  73 75 74  Resp:  13 12 15   Temp: 97.7 F (36.5 C)     TempSrc: Oral     SpO2:  92% 91% 94%  Weight: 254 lb 3.1 oz (115.3 kg)     Height:        Intake/Output Summary (Last 24 hours) at 09/25/16 0827 Last data filed at 09/25/16 0000  Gross per 24 hour  Intake                0 ml  Output              550 ml  Net             -550 ml   Filed Weights   09/21/16 1415 09/24/16 0500 09/25/16 0349  Weight: 270 lb (122.5 kg) 249 lb 5.4 oz (113.1 kg) 254 lb 3.1 oz (115.3 kg)    Telemetry    NSR , occasional PVCs  - Personally Reviewed  ECG     NSR  - Personally Reviewed  Physical Exam   GEN: No acute distress.   Neck: No JVD Cardiac: RRR, no murmurs, rubs, or gallops.  Respiratory: Clear to auscultation bilaterally. GI: Soft,  nontender, non-distended  MS: No edema; No deformity. Neuro:  Nonfocal  Psych: Normal affect   Labs    Chemistry Recent Labs Lab 09/18/16 2321 09/21/16 1436 09/22/16 0907  09/23/16 1501 09/24/16 0336 09/25/16 0334  NA 139 137 141  < > 138 139 137  K 3.7 3.1* 3.1*  < > 2.8* 3.5 3.6  CL 102 98* 100*  < > 101 104 102  CO2 25 28 30   < > 23 27 26   GLUCOSE 158* 141* 100*  < > 152* 105* 109*  BUN 22* 15 12  < > 13 15 15   CREATININE 1.12 0.86 0.75  < > 1.10 0.83 0.89  CALCIUM 9.6 9.5 9.3  < > 9.3 8.8* 9.1  PROT 7.4 7.8 7.4  --   --   --   --   ALBUMIN 4.6 5.0 4.4  --   --   --   --   AST 23 23 21   --   --   --   --   ALT 15* 18  16*  --   --   --   --   ALKPHOS 76 75 71  --   --   --   --   BILITOT 0.8 0.8 1.3*  --   --   --   --   GFRNONAA >60 >60 >60  < > >60 >60 >60  GFRAA >60 >60 >60  < > >60 >60 >60  ANIONGAP 12 11 11   < > 14 8 9   < > = values in this interval not displayed.   Hematology Recent Labs Lab 09/23/16 0611 09/24/16 0336 09/25/16 0334  WBC 10.5 10.6* 11.2*  RBC 4.47 4.11* 4.16*  HGB 13.4 12.4* 12.8*  HCT 39.1 36.2* 36.5*  MCV 87.5 88.1 87.7  MCH 30.0 30.2 30.8  MCHC 34.3 34.3 35.1  RDW 13.3 13.4 13.4  PLT 194 170 171    Cardiac EnzymesNo results for input(s): TROPONINI in the last 168 hours. No results for input(s): TROPIPOC in the last 168 hours.   BNPNo results for input(s): BNP, PROBNP in the last 168 hours.   DDimer No results for input(s): DDIMER in the last 168 hours.   Radiology    No results found.  Cardiac Studies     Patient Profile     66 y.o. male history of hypertension, supraventricular tachycardia and paroxysmal atrial fibrillation.  Assessment & Plan    1. Supraventricular tachycardia. We've increased his carvedilol to 25 mg twice a day. This seems to have helped. He will follow-up with Dr. Rayann Heman. He may want to consider ablation at some point. This has been offered in the past.  2. Essential hypertension: Blood  pressure remains a little elevated. We have gradually increased his medications.  He was hypotensive several days ago but now he clearly is hypertensive. We will add back his amlodipine 5 mg a day  We can titrate as needed.   I've instructed him to eat a low-salt diet. He had been eating a very high salt diet prior to coming in.  For questions or updates, please contact [place] Please consult [email or web address] for contact info under Cardiology/STEMI.      Signed, Mertie Moores, MD  09/25/2016, 8:27 AM

## 2016-09-30 ENCOUNTER — Ambulatory Visit (INDEPENDENT_AMBULATORY_CARE_PROVIDER_SITE_OTHER): Payer: Medicare Other | Admitting: *Deleted

## 2016-09-30 DIAGNOSIS — Z5181 Encounter for therapeutic drug level monitoring: Secondary | ICD-10-CM | POA: Diagnosis not present

## 2016-09-30 DIAGNOSIS — Z86711 Personal history of pulmonary embolism: Secondary | ICD-10-CM | POA: Diagnosis not present

## 2016-09-30 DIAGNOSIS — I480 Paroxysmal atrial fibrillation: Secondary | ICD-10-CM

## 2016-09-30 LAB — POCT INR: INR: 4.6

## 2016-10-02 DIAGNOSIS — K4090 Unilateral inguinal hernia, without obstruction or gangrene, not specified as recurrent: Secondary | ICD-10-CM | POA: Diagnosis not present

## 2016-10-02 DIAGNOSIS — N201 Calculus of ureter: Secondary | ICD-10-CM | POA: Diagnosis not present

## 2016-10-07 ENCOUNTER — Telehealth: Payer: Self-pay | Admitting: Internal Medicine

## 2016-10-07 NOTE — Telephone Encounter (Signed)
New message          [facility] Pre-operative Risk Assessment    Request for surgical clearance:  1. What type of surgery is being performed? Extra corporeal shockwave lithotripsy 2. When is this surgery scheduled?  Pending clearance  3. Are there any medications that need to be held prior to surgery and how long? Need instructions on holding coumadin  Name of physician performing surgery?  Dr Jeffie Pollock What is your office phone and fax number?  Fax [phone] 4. Anesthesia type (None, local, MAC, general) ? local   Brandon [name] 10/07/2016, 12:10 PM  _________________________________________________________________   (provider comments below)

## 2016-10-07 NOTE — Telephone Encounter (Signed)
LMOM with surgery center to see if pt needs to hold his Coumadin. Procedure is not invasive and pt requires Lovenox bridge any time he holds his Coumadin. Would prefer for pt to continue Coumadin if possible.

## 2016-10-09 NOTE — Telephone Encounter (Signed)
LMOM with surgery center again.

## 2016-10-14 ENCOUNTER — Ambulatory Visit (INDEPENDENT_AMBULATORY_CARE_PROVIDER_SITE_OTHER): Payer: Medicare Other

## 2016-10-14 DIAGNOSIS — Z5181 Encounter for therapeutic drug level monitoring: Secondary | ICD-10-CM

## 2016-10-14 DIAGNOSIS — I480 Paroxysmal atrial fibrillation: Secondary | ICD-10-CM | POA: Diagnosis not present

## 2016-10-14 LAB — POCT INR: INR: 3.1

## 2016-10-14 NOTE — Telephone Encounter (Signed)
Apparently Brandon [name] is out of the office all week. I was transferred to a different surgery coordinator, Pam. There was no answer so I left another voicemail.

## 2016-10-15 NOTE — Telephone Encounter (Signed)
Called Dr Ralene Muskrat office again and was advised that Marlowe Kays, surgery coordinator is back in the office today. I was transferred to her number and there was no answer. Left another message.

## 2016-10-20 ENCOUNTER — Other Ambulatory Visit: Payer: Self-pay | Admitting: Urology

## 2016-10-20 NOTE — Telephone Encounter (Signed)
Procedure scheduled for 01/25 at 8:45am. Will coordinate Lovenox bridge with pt the week prior.

## 2016-10-20 NOTE — Telephone Encounter (Signed)
Called Dr Ralene Muskrat office again and spoke with Marlowe Kays. Per Dr Jeffie Pollock, pt will need to hold his Coumadin for 5 full days prior to procedure. They are ok with Lovenox bridge - last dose 12 hours before and pt will resume 48 hours after (needs renal ultrasound prior to resuming anticoagulation). Will coordinate Lovenox bridge in clinic.   Procedure date is not set yet - Marlowe Kays will call back once they have procedure date so we can reach out to pt to schedule anticoag visit to discuss Lovenox bridge.

## 2016-10-21 ENCOUNTER — Ambulatory Visit (INDEPENDENT_AMBULATORY_CARE_PROVIDER_SITE_OTHER): Payer: Medicare Other

## 2016-10-21 ENCOUNTER — Ambulatory Visit (INDEPENDENT_AMBULATORY_CARE_PROVIDER_SITE_OTHER): Payer: Medicare Other | Admitting: Family Medicine

## 2016-10-21 ENCOUNTER — Encounter: Payer: Self-pay | Admitting: Family Medicine

## 2016-10-21 VITALS — BP 110/70 | HR 60 | Ht 75.0 in | Wt 256.0 lb

## 2016-10-21 DIAGNOSIS — I480 Paroxysmal atrial fibrillation: Secondary | ICD-10-CM

## 2016-10-21 DIAGNOSIS — F331 Major depressive disorder, recurrent, moderate: Secondary | ICD-10-CM | POA: Diagnosis not present

## 2016-10-21 DIAGNOSIS — Z5181 Encounter for therapeutic drug level monitoring: Secondary | ICD-10-CM | POA: Diagnosis not present

## 2016-10-21 DIAGNOSIS — F325 Major depressive disorder, single episode, in full remission: Secondary | ICD-10-CM | POA: Insufficient documentation

## 2016-10-21 DIAGNOSIS — Z0000 Encounter for general adult medical examination without abnormal findings: Secondary | ICD-10-CM | POA: Diagnosis present

## 2016-10-21 DIAGNOSIS — I208 Other forms of angina pectoris: Secondary | ICD-10-CM

## 2016-10-21 DIAGNOSIS — Z23 Encounter for immunization: Secondary | ICD-10-CM | POA: Diagnosis not present

## 2016-10-21 DIAGNOSIS — F329 Major depressive disorder, single episode, unspecified: Secondary | ICD-10-CM | POA: Insufficient documentation

## 2016-10-21 DIAGNOSIS — N400 Enlarged prostate without lower urinary tract symptoms: Secondary | ICD-10-CM | POA: Diagnosis not present

## 2016-10-21 DIAGNOSIS — I10 Essential (primary) hypertension: Secondary | ICD-10-CM | POA: Diagnosis not present

## 2016-10-21 DIAGNOSIS — Z87442 Personal history of urinary calculi: Secondary | ICD-10-CM | POA: Diagnosis not present

## 2016-10-21 DIAGNOSIS — Z1159 Encounter for screening for other viral diseases: Secondary | ICD-10-CM

## 2016-10-21 DIAGNOSIS — N200 Calculus of kidney: Secondary | ICD-10-CM

## 2016-10-21 HISTORY — DX: Calculus of kidney: N20.0

## 2016-10-21 LAB — POCT INR: INR: 4

## 2016-10-21 MED ORDER — ENOXAPARIN SODIUM 120 MG/0.8ML ~~LOC~~ SOLN: 120.0000 mg | Freq: Two times a day (BID) | SUBCUTANEOUS | 0 refills | Status: DC

## 2016-10-21 MED ORDER — ESCITALOPRAM OXALATE 20 MG PO TABS: 20.0000 mg | ORAL_TABLET | Freq: Every day | ORAL | 2 refills | Status: DC

## 2016-10-21 NOTE — Assessment & Plan Note (Deleted)
A 

## 2016-10-21 NOTE — Patient Instructions (Signed)
10/23/16: Last dose of Coumadin.  10/24/16: No Coumadin or Lovenox.  10/25/16: Inject Lovenox 120mg  in the fatty abdominal tissue at least 2 inches from the belly button twice a day about 12 hours apart, 8am and 8pm rotate sites. No Coumadin.  10/26/16: Inject Lovenox in the fatty tissue every 12 hours, 8am and 8pm. No Coumadin.  10/27/16: Inject Lovenox in the fatty tissue every 12 hours, 8am and 8pm. No Coumadin.  10/28/16: Inject Lovenox in the fatty tissue in the morning at 8 am (No PM dose). No Coumadin.  10/29/16: Procedure Day - No Lovenox - Resume Coumadin in the evening or as directed by doctor (take an extra half tablet with usual dose for 2 days then resume normal dose).  10/30/16: No Lovenox, take Coumadin.  10/31/16:  Resume Lovenox inject in the fatty tissue every 12 hours and take Coumadin.   11/01/16: Inject Lovenox in the fatty tissue every 12 hours and take Coumadin.  11/02/16: Inject Lovenox in the fatty tissue every 12 hours and take Coumadin.  11/03/16: Inject Lovenox in the fatty tissue every 12 hours and take Coumadin.  11/04/16: Coumadin appt to check INR.

## 2016-10-21 NOTE — Assessment & Plan Note (Addendum)
Chronic. Suboptimal control. Currently on half the dose of his chronic use of Lexapro.  --Given the projection for Lexapro 20 mg daily --RTC in 2 weeks to follow-up for symptom control

## 2016-10-21 NOTE — Assessment & Plan Note (Signed)
Acute. Stable. Right-sided requiring lithotripsy which is scheduled later this week. --We'll follow up with results following surgery --Continue Flomax, however patient will likely benefit from finasteride to suboptimal control of his BPH

## 2016-10-21 NOTE — Patient Instructions (Signed)
Thank you for coming in to see [place] today. Please see below to review our plan for today's visit.  1. I have refilled your Lexapro 20 mg daily. Please take as instructed and follow up in 2 weeks with me. 2. I will contact you of your blood work is abnormal, otherwise expect received her results in the mail. 3. Please call and schedule a screeningcolonoscopy.  Please call the clinic at [phone] if your symptoms worsen or you have any concerns. It was my pleasure to see you. -- Harriet Butte, [place], PGY-2

## 2016-10-21 NOTE — Progress Notes (Signed)
   Subjective:   Patient ID: Brandon [name].    DOB: [birth year], 66 y.o. male   MRN: [id number]  CC: "Establish care"  HPI: Brandon [name]. is a 66 y.o. male who presents to clinic today for the following:  Depression: patient states he has been struggling with depression for over 10 years. Was recently controlled with use of Lexapro 20 mg daily but was discontinued for approximately 5 weeks due to transition of care and inability to follow-up with PCP. Patient worse restarted on Lexapro 10 mg daily after discharge from hospital. ROS: denies anxiety, feeling of fatigue, SI/HI  Kidney stone: was diagnosed with right-sided kidney stone requiring lithotripsy which is scheduled later this week. Patient states he has been urinating difficulty. He currently takes Flomax but frequently has to use the bathroom at night. ROS: denies fevers or chills, nausea or vomiting, gross hematuria.  Atrial-fib: patient is followed by Dr. Rayann Heman. He has been on chronic use of warfarin without difficulty. ROS: denies chest pain, shortness of breath, palpitations, melena or hematochezia  Complete ROS performed, see HPI for pertinent.  [place]: PAF (on warfarin), HTN, HLD, h/o EtOH abuse, MDD, kidney stone, BPH, OSA, obesity, h/o pulmonary embolism. Surgical history R-inguinal hernia x3, L-rotator cuff repair, L-total knee, cataract, cardioversion 3. Family history HF, HTN, asthma, bone cancer (father). Smoking status reviewed. Medications reviewed.  Objective:   BP 110/70   Pulse 60   Ht _0  (1.905 m)   Wt 256 lb (116.1 kg)   SpO2 94%   BMI 32.00 kg/m  Vitals and nursing note reviewed.  General: well nourished, well developed, in no acute distress with non-toxic appearance HEENT: normocephalic, atraumatic, moist mucous membranes Neck: supple, non-tender without lymphadenopathy CV: regular rate and rhythm without murmurs, rubs, or gallops, no lower extremity edema Lungs: clear to auscultation bilaterally  with normal work of breathing Abdomen: soft, non-tender, non-distended, no masses or organomegaly palpable, normoactive bowel sounds Skin: warm, dry, no rashes or lesions, cap refill < 2 seconds Extremities: warm and well perfused, normal tone  Assessment & Plan:   MDD (major depressive disorder) Chronic. Suboptimal control. Currently on half the dose of his chronic use of Lexapro.  --Given the projection for Lexapro 20 mg daily --RTC in 2 weeks to follow-up for symptom control  Paroxysmal atrial fibrillation (HCC) Chronic. Rate controlled on anticoagulation. --Continue Coreg 25 mg twice daily and warfarin at current dose and follow with Dr. Rayann Heman as scheduled  History of kidney stones Acute. Stable. Right-sided requiring lithotripsy which is scheduled later this week. --We'll follow up with results following surgery --Continue Flomax, however patient will likely benefit from finasteride to suboptimal control of his BPH  Orders Placed This Encounter  Procedures  . Flu Vaccine QUAD 36+ mos IM  . Pneumococcal conjugate vaccine 13-valent IM  . CBC with Differential/Platelet  . CMP14+EGFR  . LDL cholesterol, direct  . TSH  . Hepatitis C antibody   Meds ordered this encounter  Medications  . escitalopram (LEXAPRO) 20 MG tablet    Sig: Take 1 tablet (20 mg total) by mouth daily.    Dispense:  60 tablet    Refill:  2    Harriet Butte, [place], PGY-2 10/21/2016 12:17 PM

## 2016-10-21 NOTE — Assessment & Plan Note (Addendum)
Chronic. Rate controlled on anticoagulation. --Continue Coreg 25 mg twice daily and warfarin at current dose and follow with Dr. Rayann Heman as scheduled

## 2016-10-22 ENCOUNTER — Telehealth: Payer: Self-pay | Admitting: Family Medicine

## 2016-10-22 ENCOUNTER — Telehealth: Payer: Self-pay | Admitting: *Deleted

## 2016-10-22 LAB — CBC WITH DIFFERENTIAL/PLATELET
Basophils Absolute: 0 10*3/uL (ref 0.0–0.2)
Basos: 1 %
EOS (ABSOLUTE): 0.6 10*3/uL — ABNORMAL HIGH (ref 0.0–0.4)
Eos: 11 %
Hematocrit: 36.9 % — ABNORMAL LOW (ref 37.5–51.0)
Hemoglobin: 12.2 g/dL — ABNORMAL LOW (ref 13.0–17.7)
Immature Grans (Abs): 0 10*3/uL (ref 0.0–0.1)
Immature Granulocytes: 0 %
Lymphocytes Absolute: 1.3 10*3/uL (ref 0.7–3.1)
Lymphs: 22 %
MCH: 30 pg (ref 26.6–33.0)
MCHC: 33.1 g/dL (ref 31.5–35.7)
MCV: 91 fL (ref 79–97)
Monocytes Absolute: 0.6 10*3/uL (ref 0.1–0.9)
Monocytes: 11 %
Neutrophils Absolute: 3.2 10*3/uL (ref 1.4–7.0)
Neutrophils: 55 %
Platelets: 177 10*3/uL (ref 150–379)
RBC: 4.06 x10E6/uL — ABNORMAL LOW (ref 4.14–5.80)
RDW: 14 % (ref 12.3–15.4)
WBC: 5.8 10*3/uL (ref 3.4–10.8)

## 2016-10-22 LAB — CMP14+EGFR
ALT: 5 IU/L (ref 0–44)
AST: 11 IU/L (ref 0–40)
Albumin/Globulin Ratio: 2 (ref 1.2–2.2)
Albumin: 4.1 g/dL (ref 3.6–4.8)
Alkaline Phosphatase: 74 IU/L (ref 39–117)
BUN/Creatinine Ratio: 13 (ref 10–24)
BUN: 10 mg/dL (ref 8–27)
Bilirubin Total: 0.3 mg/dL (ref 0.0–1.2)
CO2: 25 mmol/L (ref 20–29)
Calcium: 9.2 mg/dL (ref 8.6–10.2)
Chloride: 105 mmol/L (ref 96–106)
Creatinine, Ser: 0.77 mg/dL (ref 0.76–1.27)
GFR calc Af Amer: 108 mL/min/{1.73_m2} (ref >59)
GFR calc non Af Amer: 93 mL/min/{1.73_m2} (ref >59)
Globulin, Total: 2.1 g/dL (ref 1.5–4.5)
Glucose: 91 mg/dL (ref 65–99)
Potassium: 3.9 mmol/L (ref 3.5–5.2)
Sodium: 144 mmol/L (ref 134–144)
Total Protein: 6.2 g/dL (ref 6.0–8.5)

## 2016-10-22 LAB — HEPATITIS C ANTIBODY: Hep C Virus Ab: 1.2 s/co ratio — ABNORMAL HIGH (ref 0.0–0.9)

## 2016-10-22 LAB — TSH: TSH: 0.982 u[IU]/mL (ref 0.450–4.500)

## 2016-10-22 LAB — LDL CHOLESTEROL, DIRECT: LDL Direct: 138 mg/dL — ABNORMAL HIGH (ref 0–99)

## 2016-10-22 NOTE — Telephone Encounter (Signed)
Error

## 2016-10-22 NOTE — Telephone Encounter (Signed)
Per [facility] center pt needs a referral to see them.  Will forward to MD.  Malory Spurr, Salome Spotted, [place]

## 2016-10-22 NOTE — Telephone Encounter (Signed)
Contacted pt regarding positive HCV ab and anemia. Pt denies symptoms of fatigue, SOB, constipation, melena or hematochezia. Pt intends to call [facility] later this week to schedule screening colonoscopy. Discussed steps regarding HCV results. Plan to refer to ID if quant titers support chronic HCV. Pt without further questions or concerns. -- Harriet Butte, [place], PGY-2

## 2016-10-23 ENCOUNTER — Other Ambulatory Visit: Payer: Self-pay | Admitting: *Deleted

## 2016-10-23 ENCOUNTER — Other Ambulatory Visit: Payer: Self-pay | Admitting: Family Medicine

## 2016-10-23 DIAGNOSIS — Z1211 Encounter for screening for malignant neoplasm of colon: Secondary | ICD-10-CM

## 2016-10-26 ENCOUNTER — Encounter: Payer: Self-pay | Admitting: Internal Medicine

## 2016-10-26 ENCOUNTER — Encounter (HOSPITAL_COMMUNITY): Payer: Self-pay | Admitting: General Practice

## 2016-10-26 ENCOUNTER — Ambulatory Visit (INDEPENDENT_AMBULATORY_CARE_PROVIDER_SITE_OTHER): Payer: Medicare Other | Admitting: Internal Medicine

## 2016-10-26 VITALS — BP 124/78 | HR 59 | Ht 75.0 in | Wt 253.4 lb

## 2016-10-26 DIAGNOSIS — I471 Supraventricular tachycardia: Secondary | ICD-10-CM

## 2016-10-26 DIAGNOSIS — I10 Essential (primary) hypertension: Secondary | ICD-10-CM | POA: Diagnosis not present

## 2016-10-26 DIAGNOSIS — I480 Paroxysmal atrial fibrillation: Secondary | ICD-10-CM

## 2016-10-26 DIAGNOSIS — I4819 Other persistent atrial fibrillation: Secondary | ICD-10-CM

## 2016-10-26 DIAGNOSIS — G4733 Obstructive sleep apnea (adult) (pediatric): Secondary | ICD-10-CM

## 2016-10-26 DIAGNOSIS — I481 Persistent atrial fibrillation: Secondary | ICD-10-CM

## 2016-10-26 DIAGNOSIS — I208 Other forms of angina pectoris: Secondary | ICD-10-CM

## 2016-10-26 MED ORDER — CARVEDILOL 25 MG PO TABS: 25.0000 mg | ORAL_TABLET | Freq: Two times a day (BID) | ORAL | 3 refills | Status: DC

## 2016-10-26 MED ORDER — SPIRONOLACTONE 25 MG PO TABS: 25.0000 mg | ORAL_TABLET | Freq: Every day | ORAL | 3 refills | Status: DC

## 2016-10-26 MED ORDER — AMLODIPINE BESYLATE 5 MG PO TABS: 5.0000 mg | ORAL_TABLET | Freq: Every day | ORAL | 3 refills | Status: DC

## 2016-10-26 MED ORDER — LOSARTAN POTASSIUM 100 MG PO TABS: 100.0000 mg | ORAL_TABLET | Freq: Every day | ORAL | 3 refills | Status: DC

## 2016-10-26 NOTE — Patient Instructions (Addendum)
Medication Instructions:  Your physician recommends that you continue on your current medications as directed. Please refer to the Current Medication list given to you today.  -- If you need a refill on your cardiac medications before your next appointment, please call your pharmacy. --  Labwork: None ordered  Testing/Procedures: None ordered  Follow-Up: Your physician wants you to follow-up in: 6 months with Dr. Rayann Heman.  You will receive a reminder letter in the mail two months in advance. If you don't receive a letter, please call our office to schedule the follow-up appointment.  Thank you for choosing [facility]!!    [phone]  Any Other Special Instructions Will Be Listed Below (If Applicable).

## 2016-10-26 NOTE — Progress Notes (Signed)
PCP: [place] Bing, DO   Primary EP: Dr Larose Kells. is a 66 y.o. male who presents today for routine electrophysiology followup.  Arrhythmias are well controlled.  Recovering from recent hernia surgery and is planning soon to have lithotripsy.  Today, he denies symptoms of palpitations, chest pain, shortness of breath,  lower extremity edema, dizziness, presyncope, or syncope.  The patient is otherwise without complaint today.   Past Medical History:  Diagnosis Date  . Anxiety   . Arthritis   . Benign neoplasm of colon   . Benign prostatic hyperplasia with urinary obstruction   . Bilateral pulmonary embolism ([place]) 3/14   admitted to [facility],  treated with Xarelto  . Cataract   . Chronic pain   . History of pulmonary embolism 03/19/2012  . Hyperlipemia   . Hypertension   . Insomnia   . Major depressive disorder, recurrent episode ([place])   . Mitral regurgitation 04/24/2013   Mild by TEE  . Obesity   . OSA (obstructive sleep apnea)    noncompliant with CPAP.  07/27/13- awaiting a CPAP,  . Osteoporosis   . Persistent atrial fibrillation ([place])   . Restless leg syndrome    takes gabapentin  . Sciatica   . SVT (supraventricular tachycardia) ([place]) 03/07/2016  . Thrombus of left atrial appendage 03/09/2013  . Ventral hernia, unspecified, without mention of obstruction or gangrene    right abdominal wall   Past Surgical History:  Procedure Laterality Date  . CARDIOVERSION N/A 03/21/2012   Procedure: CARDIOVERSION;  Surgeon: Birdie Riddle, MD;  Location[facility] ENDOSCOPY;  Service: Cardiovascular;  Laterality: N/A;  . CARDIOVERSION N/A 04/24/2013   Procedure: CARDIOVERSION;  Surgeon: Dorothy Spark, MD;  Location: [facility] ENDOSCOPY;  Service: Cardiovascular;  Laterality: N/A;  . CARDIOVERSION N/A 06/27/2015   Procedure: CARDIOVERSION;  Surgeon: Larey Dresser, MD;  Location: [place];  Service: Cardiovascular;  Laterality: N/A;  . CATARACT EXTRACTION    . COLONOSCOPY W/  POLYPECTOMY    . ELECTROPHYSIOLOGIC STUDY N/A 07/30/2015   Procedure: Atrial Fibrillation Ablation;  Surgeon: Thompson Grayer, MD;  Location: [place] CV LAB;  Service: Cardiovascular;  Laterality: N/A;  . EYE SURGERY Right    cataract  . FEMUR FRACTURE SURGERY    . HERNIA REPAIR  07/06/2011  . INGUINAL HERNIA REPAIR Right 07/28/2013   Procedure: RIGHT INGUINAL HERNIA REPAIR;  Surgeon: Imogene Burn. Georgette Dover, MD;  Location: [place];  Service: General;  Laterality: Right;  . INGUINAL HERNIA REPAIR Right 09/22/2016   Procedure: LAPAROSCOPIC RIGHT INGUINAL HERNIA;  Surgeon: Kinsinger, Arta Bruce, MD;  Location: [facility];  Service: General;  Laterality: Right;  With MESH  . INSERTION OF MESH Right 07/28/2013   Procedure: INSERTION OF MESH;  Surgeon: Imogene Burn. Georgette Dover, MD;  Location: [place];  Service: General;  Laterality: Right;  . KNEE ARTHROSCOPY     left  . REPLACEMENT TOTAL KNEE Left   . ROTATOR CUFF REPAIR     right  . ROTATOR CUFF REPAIR Left 03/08/2014   DR SUPPLE  . SHOULDER ARTHROSCOPY WITH ROTATOR CUFF REPAIR AND SUBACROMIAL DECOMPRESSION Left 03/08/2014   Procedure: LEFT SHOULDER ARTHROSCOPY WITH ROTATOR CUFF REPAIR/SUBACROMIAL DECOMPRESSION/DISTAL CLAVICLE RESECTION;  Surgeon: Marin Shutter, MD;  Location: [place];  Service: Orthopedics;  Laterality: Left;  . TEE WITHOUT CARDIOVERSION N/A 03/21/2012   Procedure: TRANSESOPHAGEAL ECHOCARDIOGRAM (TEE);  Surgeon: Birdie Riddle, MD;  Location: [place];  Service: Cardiovascular;  Laterality: N/A;  .  TEE WITHOUT CARDIOVERSION N/A 04/24/2013   Procedure: TRANSESOPHAGEAL ECHOCARDIOGRAM (TEE);  Surgeon: Dorothy Spark, MD;  Location: [place];  Service: Cardiovascular;  Laterality: N/A;  . TEE WITHOUT CARDIOVERSION N/A 07/29/2015   Procedure: TRANSESOPHAGEAL ECHOCARDIOGRAM (TEE);  Surgeon: Fay Records, MD;  Location: [facility] ENDOSCOPY;  Service: Cardiovascular;  Laterality: N/A;  . TONSILLECTOMY      ROS- all systems are reviewed and negatives except  as per HPI above  Current Outpatient Prescriptions  Medication Sig Dispense Refill  . acetaminophen (TYLENOL) 325 MG tablet You can take 2 tablets every 4 hours as need and alternate with the prescribed pain medicine, which also has Tylenol.    DO NOT TAKE MORE THAN 4000 MG OF TYLENOL PER DAY.  IT CAN HARM YOUR LIVER.  TYLENOL (ACETAMINOPHEN) IS ALSO IN YOUR PRESCRIPTION PAIN MEDICATION.  YOU HAVE TO COUNT IT IN YOUR DAILY TOTAL.    Marland Kitchen amLODipine (NORVASC) 5 MG tablet Take 1 tablet (5 mg total) by mouth daily. 30 tablet 0  . carvedilol (COREG) 25 MG tablet Take 1 tablet (25 mg total) by mouth 2 (two) times daily with a meal. 60 tablet 0  . enoxaparin (LOVENOX) 120 MG/0.8ML injection Inject 0.8 mLs (120 mg total) into the skin every 12 (twelve) hours. As instructed by Coumadin Clinic 20 Syringe 0  . escitalopram (LEXAPRO) 20 MG tablet Take 1 tablet (20 mg total) by mouth daily. 60 tablet 2  . gabapentin (NEURONTIN) 800 MG tablet Take 800 mg by mouth 2 (two) times daily. Restless leg syndrome - take at dinner and bedtime  0  . losartan (COZAAR) 100 MG tablet Take 1 tablet (100 mg total) by mouth daily. 90 tablet 3  . spironolactone (ALDACTONE) 25 MG tablet Take 1 tablet (25 mg total) by mouth daily. 30 tablet 0  . tamsulosin (FLOMAX) 0.4 MG CAPS capsule Take 0.4 mg by mouth 2 (two) times daily.     Marland Kitchen warfarin (COUMADIN) 5 MG tablet TAKE 1 TABLET AS DIRECTED BY COUMADIN CLINIC (Patient taking differently: Take 1 tablet (5 mg) by mouth daily except on Tues and Thurs Take 1 1/2 tablet (7.5mg )) 40 tablet 3   No current facility-administered medications for this visit.     Physical Exam: Vitals:   10/26/16 0926  BP: 124/78  Pulse: (!) 59  SpO2: 97%  Weight: 253 lb 6.4 oz (114.9 kg)  Height: 6\' 3"  (1.905 m)    GEN- The patient is well appearing, alert and oriented x 3 today.   Head- normocephalic, atraumatic Eyes-  Sclera clear, conjunctiva pink Ears- hearing intact Oropharynx- clear Lungs-  Clear to ausculation bilaterally, normal work of breathing Heart- Regular rate and rhythm, no murmurs, rubs or gallops, PMI not laterally displaced GI- soft, NT, ND, + BS Extremities- no clubbing, cyanosis, or edema  EKG tracing ordered today is personally reviewed and shows  Sinus rhythm 59 bpm, PR 230 msec, Qtc 423 msec  Assessment and Plan:  1. Persistent afib Well controlled post ablation, without recurrence off AAD therapy Continue life long anticoagulation given prior LAA thrombus  2. SVT Adenosine sensitive Will continue to follow conservatively  He has Kardia  Of note, he did have VA dissociation on prior EPS  3. HTN Stable No change required today  4. Hypokalemia Improved with spironolactone He reports that PCP has recently obtained labs.  I have asked that he send them to [place].  5. ETOH Avoidance encouraged  6. OSA Not using CPAP I have asked that  he address this with Dr Yisroel Ramming on follow-up  Return to see me in 6 months  Thompson Grayer MD, [facility] 10/26/2016 9:53 AM

## 2016-10-29 ENCOUNTER — Encounter (HOSPITAL_COMMUNITY): Payer: Self-pay | Admitting: *Deleted

## 2016-10-29 ENCOUNTER — Ambulatory Visit (HOSPITAL_COMMUNITY): Payer: Medicare Other

## 2016-10-29 ENCOUNTER — Encounter (HOSPITAL_COMMUNITY)
Admission: RE | Disposition: A | Discharge status: Home / Self Care | Payer: Self-pay | Source: Ambulatory Visit | Attending: Urology

## 2016-10-29 ENCOUNTER — Ambulatory Visit (HOSPITAL_COMMUNITY)
Admission: RE | Admit: 2016-10-29 | Discharge: 2016-10-29 | Disposition: A | Discharge status: Home / Self Care | Payer: Medicare Other | Source: Ambulatory Visit | Attending: Urology | Admitting: Urology

## 2016-10-29 DIAGNOSIS — G4700 Insomnia, unspecified: Secondary | ICD-10-CM | POA: Insufficient documentation

## 2016-10-29 DIAGNOSIS — F419 Anxiety disorder, unspecified: Secondary | ICD-10-CM | POA: Diagnosis not present

## 2016-10-29 DIAGNOSIS — I471 Supraventricular tachycardia: Secondary | ICD-10-CM | POA: Diagnosis not present

## 2016-10-29 DIAGNOSIS — G2581 Restless legs syndrome: Secondary | ICD-10-CM | POA: Diagnosis not present

## 2016-10-29 DIAGNOSIS — I481 Persistent atrial fibrillation: Secondary | ICD-10-CM | POA: Insufficient documentation

## 2016-10-29 DIAGNOSIS — E669 Obesity, unspecified: Secondary | ICD-10-CM | POA: Insufficient documentation

## 2016-10-29 DIAGNOSIS — G4730 Sleep apnea, unspecified: Secondary | ICD-10-CM | POA: Diagnosis not present

## 2016-10-29 DIAGNOSIS — Z96652 Presence of left artificial knee joint: Secondary | ICD-10-CM | POA: Diagnosis not present

## 2016-10-29 DIAGNOSIS — F329 Major depressive disorder, single episode, unspecified: Secondary | ICD-10-CM | POA: Diagnosis not present

## 2016-10-29 DIAGNOSIS — N200 Calculus of kidney: Secondary | ICD-10-CM | POA: Diagnosis not present

## 2016-10-29 DIAGNOSIS — Z7901 Long term (current) use of anticoagulants: Secondary | ICD-10-CM | POA: Diagnosis not present

## 2016-10-29 DIAGNOSIS — N201 Calculus of ureter: Secondary | ICD-10-CM | POA: Diagnosis not present

## 2016-10-29 DIAGNOSIS — M810 Age-related osteoporosis without current pathological fracture: Secondary | ICD-10-CM | POA: Insufficient documentation

## 2016-10-29 DIAGNOSIS — I340 Nonrheumatic mitral (valve) insufficiency: Secondary | ICD-10-CM | POA: Insufficient documentation

## 2016-10-29 DIAGNOSIS — G4733 Obstructive sleep apnea (adult) (pediatric): Secondary | ICD-10-CM | POA: Diagnosis not present

## 2016-10-29 DIAGNOSIS — Z87891 Personal history of nicotine dependence: Secondary | ICD-10-CM | POA: Insufficient documentation

## 2016-10-29 DIAGNOSIS — N401 Enlarged prostate with lower urinary tract symptoms: Secondary | ICD-10-CM | POA: Insufficient documentation

## 2016-10-29 DIAGNOSIS — E785 Hyperlipidemia, unspecified: Secondary | ICD-10-CM | POA: Insufficient documentation

## 2016-10-29 DIAGNOSIS — N138 Other obstructive and reflux uropathy: Secondary | ICD-10-CM | POA: Insufficient documentation

## 2016-10-29 DIAGNOSIS — G8929 Other chronic pain: Secondary | ICD-10-CM | POA: Insufficient documentation

## 2016-10-29 DIAGNOSIS — Z87442 Personal history of urinary calculi: Secondary | ICD-10-CM | POA: Insufficient documentation

## 2016-10-29 DIAGNOSIS — Z888 Allergy status to other drugs, medicaments and biological substances status: Secondary | ICD-10-CM | POA: Insufficient documentation

## 2016-10-29 DIAGNOSIS — I10 Essential (primary) hypertension: Secondary | ICD-10-CM | POA: Diagnosis not present

## 2016-10-29 DIAGNOSIS — Z86711 Personal history of pulmonary embolism: Secondary | ICD-10-CM | POA: Diagnosis not present

## 2016-10-29 HISTORY — DX: Personal history of urinary calculi: Z87.442

## 2016-10-29 HISTORY — PX: EXTRACORPOREAL SHOCK WAVE LITHOTRIPSY: SHX1557

## 2016-10-29 LAB — PROTIME-INR
INR: 1.02
Prothrombin Time: 13.3 seconds (ref 11.4–15.2)

## 2016-10-29 LAB — URINALYSIS, ROUTINE W REFLEX MICROSCOPIC
Bilirubin Urine: NEGATIVE
Glucose, UA: NEGATIVE mg/dL
Hgb urine dipstick: NEGATIVE
Ketones, ur: NEGATIVE mg/dL
Leukocytes, UA: NEGATIVE
Nitrite: NEGATIVE
Protein, ur: NEGATIVE mg/dL
Specific Gravity, Urine: 1.011 (ref 1.005–1.030)
pH: 8 (ref 5.0–8.0)

## 2016-10-29 SURGERY — LITHOTRIPSY, ESWL
Anesthesia: LOCAL | Laterality: Right

## 2016-10-29 MED ORDER — ONDANSETRON HCL 4 MG PO TABS: 4.0000 mg | ORAL_TABLET | Freq: Three times a day (TID) | ORAL | 0 refills | Status: DC | PRN

## 2016-10-29 MED ORDER — DIPHENHYDRAMINE HCL 25 MG PO CAPS
25.0000 mg | ORAL_CAPSULE | ORAL | Status: DC
  Filled 2016-10-29: qty 1

## 2016-10-29 MED ORDER — PROMETHAZINE HCL 25 MG/ML IJ SOLN
12.5000 mg | Freq: Four times a day (QID) | INTRAMUSCULAR | Status: DC | PRN
  Administered 2016-10-29: 12.5 mg via INTRAVENOUS
  Filled 2016-10-29: qty 1

## 2016-10-29 MED ORDER — CIPROFLOXACIN HCL 500 MG PO TABS
500.0000 mg | ORAL_TABLET | ORAL | Status: AC
  Administered 2016-10-29: 500 mg via ORAL
  Filled 2016-10-29: qty 1

## 2016-10-29 MED ORDER — LACTATED RINGERS IV SOLN
INTRAVENOUS | Status: DC
  Administered 2016-10-29: 10:00:00 via INTRAVENOUS

## 2016-10-29 MED ORDER — OXYCODONE-ACETAMINOPHEN 5-325 MG PO TABS: 1.0000 | ORAL_TABLET | Freq: Four times a day (QID) | ORAL | 0 refills | Status: DC | PRN

## 2016-10-29 MED ORDER — DIAZEPAM 5 MG PO TABS
10.0000 mg | ORAL_TABLET | ORAL | Status: AC
  Administered 2016-10-29: 10 mg via ORAL
  Filled 2016-10-29: qty 2

## 2016-10-29 MED ORDER — SODIUM CHLORIDE 0.9 % IV SOLN
INTRAVENOUS | Status: DC
  Administered 2016-10-29: 08:00:00 via INTRAVENOUS

## 2016-10-29 NOTE — Discharge Instructions (Signed)
Moderate Conscious Sedation, Adult, Care After These instructions provide you with information about caring for yourself after your procedure. Your health care provider may also give you more specific instructions. Your treatment has been planned according to current medical practices, but problems sometimes occur. Call your health care provider if you have any problems or questions after your procedure. What can I expect after the procedure? After your procedure, it is common:  To feel sleepy for several hours.  To feel clumsy and have poor balance for several hours.  To have poor judgment for several hours.  To vomit if you eat too soon.  Follow these instructions at home: For at least 24 hours after the procedure:   Do not: ? Participate in activities where you could fall or become injured. ? Drive. ? Use heavy machinery. ? Drink alcohol. ? Take sleeping pills or medicines that cause drowsiness. ? Make important decisions or sign legal documents. ? Take care of children on your own.  Rest. Eating and drinking  Follow the diet recommended by your health care provider.  If you vomit: ? Drink water, juice, or soup when you can drink without vomiting. ? Make sure you have little or no nausea before eating solid foods. General instructions  Have a responsible adult stay with you until you are awake and alert.  Take over-the-counter and prescription medicines only as told by your health care provider.  If you smoke, do not smoke without supervision.  Keep all follow-up visits as told by your health care provider. This is important. Contact a health care provider if:  You keep feeling nauseous or you keep vomiting.  You feel light-headed.  You develop a rash.  You have a fever. Get help right away if:  You have trouble breathing. This information is not intended to replace advice given to you by your health care provider. Make sure you discuss any questions you have  with your health care provider. Document Released: 10/12/2012 Document Revised: 05/27/2015 Document Reviewed: 04/13/2015 Elsevier Interactive Patient Education  2018 [name] After This sheet gives you information about how to care for yourself after your procedure. Your health care provider may also give you more specific instructions. If you have problems or questions, contact your health care provider. What can I expect after the procedure? After the procedure, it is common to have:  Some blood in your urine. This should only last for a few days.  Soreness in your back, sides, or upper abdomen for a few days.  Blotches or bruises on your back where the pressure wave entered the skin.  Pain, discomfort, or nausea when pieces (fragments) of the kidney stone move through the tube that carries urine from the kidney to the bladder (ureter). Stone fragments may pass soon after the procedure, but they may continue to pass for up to 4-8 weeks. ? If you have severe pain or nausea, contact your health care provider. This may be caused by a large stone that was not broken up, and this may mean that you need more treatment.  Some pain or discomfort during urination.  Some pain or discomfort in the lower abdomen or (in men) at the base of the penis.  Follow these instructions at home: Medicines  Take over-the-counter and prescription medicines only as told by your health care provider.  If you were prescribed an antibiotic medicine, take it as told by your health care provider. Do not stop taking the antibiotic even if  you start to feel better.  Do not drive for 24 hours if you were given a medicine to help you relax (sedative).  Do not drive or use heavy machinery while taking prescription pain medicine. Eating and drinking  Drink enough water and fluids to keep your urine clear or pale yellow. This helps any remaining pieces of the stone to pass. It can also help  prevent new stones from forming.  Eat plenty of fresh fruits and vegetables.  Follow instructions from your health care provider about eating and drinking restrictions. You may be instructed: ? To reduce how much salt (sodium) you eat or drink. Check ingredients and nutrition facts on packaged foods and beverages. ? To reduce how much meat you eat.  Eat the recommended amount of calcium for your age and gender. Ask your health care provider how much calcium you should have. General instructions  Get plenty of rest.  Most people can resume normal activities 1-2 days after the procedure. Ask your health care provider what activities are safe for you.  If directed, strain all urine through the strainer that was provided by your health care provider. ? Keep all fragments for your health care provider to see. Any stones that are found may be sent to a medical lab for examination. The stone may be as small as a grain of salt.  Keep all follow-up visits as told by your health care provider. This is important. Contact a health care provider if:  You have pain that is severe or does not get better with medicine.  You have nausea that is severe or does not go away.  You have blood in your urine longer than your health care provider told you to expect.  You have more blood in your urine.  You have pain during urination that does not go away.  You urinate more frequently than usual and this does not go away.  You develop a rash or any other possible signs of an allergic reaction. Get help right away if:  You have severe pain in your back, sides, or upper abdomen.  You have severe pain while urinating.  Your urine is very dark red.  You have blood in your stool (feces).  You cannot pass any urine at all.  You feel a strong urge to urinate after emptying your bladder.  You have a fever or chills.  You develop shortness of breath, difficulty breathing, or chest pain.  You have  severe nausea that leads to persistent vomiting.  You faint. Summary  After this procedure, it is common to have some pain, discomfort, or nausea when pieces (fragments) of the kidney stone move through the tube that carries urine from the kidney to the bladder (ureter). If this pain or nausea is severe, however, you should contact your health care provider.  Most people can resume normal activities 1-2 days after the procedure. Ask your health care provider what activities are safe for you.  Drink enough water and fluids to keep your urine clear or pale yellow. This helps any remaining pieces of the stone to pass, and it can help prevent new stones from forming.  If directed, strain your urine and keep all fragments for your health care provider to see. Fragments or stones may be as small as a grain of salt.  Get help right away if you have severe pain in your back, sides, or upper abdomen or have severe pain while urinating. This information is not intended to replace  advice given to you by your health care provider. Make sure you discuss any questions you have with your health care provider. Document Released: 01/11/2007 Document Revised: 11/13/2015 Document Reviewed: 11/13/2015 Elsevier Interactive Patient Education  2017 [place] may have urinary urgency (bladder spasms), pass small stone fragments, and bloody urine on / off for up to 2 weeks. This is normal.  2 - Call MD or go to ER for fever >102, severe pain / nausea / vomiting not relieved by medications, or acute change in medical status  3 - Restart Coumadin on Monday morning.

## 2016-10-29 NOTE — Brief Op Note (Signed)
10/29/2016  10:09 AM  PATIENT:  Brandon [name].  66 y.o. male  PRE-OPERATIVE DIAGNOSIS:  RIGHT URETERAL STONE  POST-OPERATIVE DIAGNOSIS:  * No post-op diagnosis entered *  PROCEDURE:  Procedure(s): RIGHT EXTRACORPOREAL SHOCK WAVE LITHOTRIPSY (ESWL) (Right)  SURGEON:  Surgeon(s) and Role:    * Alexis Frock, MD - Primary  PHYSICIAN ASSISTANT:   ASSISTANTS: none   ANESTHESIA:   MAC  EBL: none  BLOOD ADMINISTERED:none  DRAINS: none   LOCAL MEDICATIONS USED:  NONE  SPECIMEN:  No Specimen  DISPOSITION OF SPECIMEN:  N/A  COUNTS:  YES  TOURNIQUET:  * No tourniquets in log *  DICTATION: .Note written in paper chart  PLAN OF CARE: Discharge to home after PACU  PATIENT DISPOSITION:  PACU - hemodynamically stable.   Delay start of Pharmacological VTE agent (>24hrs) due to surgical blood loss or risk of bleeding: yes

## 2016-10-29 NOTE — H&P (Signed)
Brandon [name]. is an 66 y.o. male.    Chief Complaint: Pre-op RIGHT Shockwave Lithotripsy  HPI:   1 - RIGHT Ureteral Stone - 62m rt mid ureteral stone by ER CT 09/2016 and persistant on follow up imaging despite medical therapy. Stone is 541m 900HU, 11cm SSD and at level of L4-L5 interspace.  He has h/o AFib on coumadin. INR today 1.02  Today "Brandon [name]" is seen to proceed with shockwave lithotripsy for persistant right ureteral stone. No interval fevers. He has held coumadin pre-procedure as directed.   Past Medical History:  Diagnosis Date  . Anxiety   . Arthritis   . Benign neoplasm of colon   . Benign prostatic hyperplasia with urinary obstruction   . Bilateral pulmonary embolism (HCPort [place]3/14   [facility]  . Cataract   . Chronic pain   . History of kidney stones   . History of pulmonary embolism 03/19/2012  . Hyperlipemia   . Hypertension   . Insomnia   . Major depressive disorder, recurrent episode ([place]  . Mitral regurgitation 04/24/2013   Mild by TEE  . Obesity   . OSA (obstructive sleep apnea)    noncompliant with CPAP.  07/27/13- awaiting a CPAP- unable to tolerate mask  . Osteoporosis   . Persistent atrial fibrillation (HCT[place]  . Restless leg syndrome    takes gabapentin  . Sciatica   . SVT (supraventricular tachycardia) (HCGotebo03/03/2016  . Thrombus of left atrial appendage 03/09/2013  . Ventral hernia, unspecified, without mention of obstruction or gangrene    right abdominal wall    Past Surgical History:  Procedure Laterality Date  . CARDIOVERSION N/A 03/21/2012   Procedure: CARDIOVERSION;  Surgeon: AjBirdie RiddleMD;  [facility];  Service: Cardiovascular;  Laterality: N/A;  . CARDIOVERSION N/A 04/24/2013   Procedure: CARDIOVERSION;  Surgeon: KaDorothy SparkMD;  [facility];  Service: Cardiovascular;  Laterality: N/A;  . CARDIOVERSION N/A 06/27/2015   Procedure: CARDIOVERSION;  Surgeon: DaLarey DresserMD;   [facility] Service: Cardiovascular;  Laterality: N/A;  . CATARACT EXTRACTION    . COLONOSCOPY W/ POLYPECTOMY    . ELECTROPHYSIOLOGIC STUDY N/A 07/30/2015   Procedure: Atrial Fibrillation Ablation;  Surgeon: JaThompson GrayerMD;  [facility];  Service: Cardiovascular;  Laterality: N/A;  . EYE SURGERY Right    cataract  . FEMUR FRACTURE SURGERY    . HERNIA REPAIR  07/06/2011  . INGUINAL HERNIA REPAIR Right 07/28/2013   Procedure: RIGHT INGUINAL HERNIA REPAIR;  Surgeon: MaImogene BurnTsGeorgette DoverMD;  [facility] Service: General;  Laterality: Right;  . INGUINAL HERNIA REPAIR Right 09/22/2016   Procedure: LAPAROSCOPIC RIGHT INGUINAL HERNIA;  Surgeon: Kinsinger, LuArta BruceMD;  Location[facility];  Service: General;  Laterality: Right;  With MESH  . INSERTION OF MESH Right 07/28/2013   Procedure: INSERTION OF MESH;  Surgeon: MaImogene BurnTsGeorgette DoverMD;  [facility] Service: General;  Laterality: Right;  . KNEE ARTHROSCOPY     left  . REPLACEMENT TOTAL KNEE Left   . ROTATOR CUFF REPAIR     right  . ROTATOR CUFF REPAIR Left 03/08/2014   DR SUPPLE  . SHOULDER ARTHROSCOPY WITH ROTATOR CUFF REPAIR AND SUBACROMIAL DECOMPRESSION Left 03/08/2014   Procedure: LEFT SHOULDER ARTHROSCOPY WITH ROTATOR CUFF REPAIR/SUBACROMIAL DECOMPRESSION/DISTAL CLAVICLE RESECTION;  Surgeon: KeMarin ShutterMD;  [facility] Service: Orthopedics;  Laterality: Left;  .  TEE WITHOUT CARDIOVERSION N/A 03/21/2012   Procedure: TRANSESOPHAGEAL ECHOCARDIOGRAM (TEE);  Surgeon: Birdie Riddle, MD;  Location: [place];  Service: Cardiovascular;  Laterality: N/A;  . TEE WITHOUT CARDIOVERSION N/A 04/24/2013   Procedure: TRANSESOPHAGEAL ECHOCARDIOGRAM (TEE);  Surgeon: Dorothy Spark, MD;  Location: [place];  Service: Cardiovascular;  Laterality: N/A;  . TEE WITHOUT CARDIOVERSION N/A 07/29/2015   Procedure: TRANSESOPHAGEAL ECHOCARDIOGRAM (TEE);  Surgeon: Fay Records, MD;  Location: [facility];  Service:  Cardiovascular;  Laterality: N/A;  . TONSILLECTOMY      Family History  Problem Relation Age of Onset  . Cancer Father        bone  . Heart failure Mother   . Hypertension Mother   . Asthma Mother   . Cancer Paternal Grandmother        ovarian  . CVA Other        Fam Hx of multiple myeloma  . Diabetes Other        Fam Hx of DM   Social History:  reports that he has quit smoking. His smoking use included Cigarettes. He has a 2.50 pack-year smoking history. He has never used smokeless tobacco. He reports that he does not drink alcohol or use drugs.  Allergies:  Allergies  Allergen Reactions  . Ace Inhibitors Cough  . Albuterol Other (See Comments)    Racing heart    No prescriptions prior to admission.    No results found for this or any previous visit (from the past 48 hour(s)). No results found.  Review of Systems  Constitutional: Negative.  Negative for chills and fever.  HENT: Negative.   Eyes: Negative.   Respiratory: Negative.   Cardiovascular: Negative.   Gastrointestinal: Negative.   Genitourinary: Negative.   Musculoskeletal: Negative.   Skin: Negative.   Neurological: Negative.   Endo/Heme/Allergies: Negative.   Psychiatric/Behavioral: Negative.     Height _0  (1.905 m), weight 111.1 kg (245 lb). Physical Exam  Constitutional: He appears well-developed.  HENT:  Head: Normocephalic.  Eyes: Pupils are equal, round, and reactive to light.  Cardiovascular: Normal rate.   Respiratory: Effort normal.  GI: Soft.  Genitourinary:  Genitourinary Comments: Minimal Rt CVAT at present  Musculoskeletal: Normal range of motion.  Neurological: He is alert.  Skin: Skin is warm.  Psychiatric: He has a normal mood and affect.     Assessment/Plan  1 - RIGHT Ureteral Stone - proceed as planned with RIGHT shockwave lithotripsy. Risks, benefits, alternatives, expected peri-treatment course discussed previously and reiterated today.    Alexis Frock,  MD 10/29/2016, 6:25 AM

## 2016-10-30 ENCOUNTER — Emergency Department (HOSPITAL_COMMUNITY): Payer: Medicare Other

## 2016-10-30 ENCOUNTER — Emergency Department (HOSPITAL_COMMUNITY)
Admission: EM | Admit: 2016-10-30 | Discharge: 2016-10-30 | Disposition: A | Discharge status: Home / Self Care | Payer: Medicare Other | Attending: Emergency Medicine | Admitting: Emergency Medicine

## 2016-10-30 ENCOUNTER — Encounter (HOSPITAL_COMMUNITY): Payer: Self-pay | Admitting: Emergency Medicine

## 2016-10-30 DIAGNOSIS — Z7901 Long term (current) use of anticoagulants: Secondary | ICD-10-CM | POA: Insufficient documentation

## 2016-10-30 DIAGNOSIS — R000 Tachycardia, unspecified: Secondary | ICD-10-CM | POA: Diagnosis not present

## 2016-10-30 DIAGNOSIS — I10 Essential (primary) hypertension: Secondary | ICD-10-CM | POA: Insufficient documentation

## 2016-10-30 DIAGNOSIS — N201 Calculus of ureter: Secondary | ICD-10-CM | POA: Diagnosis not present

## 2016-10-30 DIAGNOSIS — Z87891 Personal history of nicotine dependence: Secondary | ICD-10-CM | POA: Insufficient documentation

## 2016-10-30 DIAGNOSIS — J9811 Atelectasis: Secondary | ICD-10-CM | POA: Diagnosis not present

## 2016-10-30 DIAGNOSIS — R9431 Abnormal electrocardiogram [ECG] [EKG]: Secondary | ICD-10-CM | POA: Diagnosis not present

## 2016-10-30 DIAGNOSIS — N200 Calculus of kidney: Secondary | ICD-10-CM | POA: Diagnosis not present

## 2016-10-30 DIAGNOSIS — R112 Nausea with vomiting, unspecified: Secondary | ICD-10-CM | POA: Diagnosis not present

## 2016-10-30 LAB — CBC WITH DIFFERENTIAL/PLATELET
Basophils Absolute: 0 10*3/uL (ref 0.0–0.1)
Basophils Relative: 0 %
Eosinophils Absolute: 0 10*3/uL (ref 0.0–0.7)
Eosinophils Relative: 0 %
HCT: 40.5 % (ref 39.0–52.0)
Hemoglobin: 14.1 g/dL (ref 13.0–17.0)
Lymphocytes Relative: 13 %
Lymphs Abs: 1.2 10*3/uL (ref 0.7–4.0)
MCH: 30.5 pg (ref 26.0–34.0)
MCHC: 34.8 g/dL (ref 30.0–36.0)
MCV: 87.7 fL (ref 78.0–100.0)
Monocytes Absolute: 0.9 10*3/uL (ref 0.1–1.0)
Monocytes Relative: 10 %
Neutro Abs: 7 10*3/uL (ref 1.7–7.7)
Neutrophils Relative %: 77 %
Platelets: 175 10*3/uL (ref 150–400)
RBC: 4.62 MIL/uL (ref 4.22–5.81)
RDW: 13.3 % (ref 11.5–15.5)
WBC: 9.2 10*3/uL (ref 4.0–10.5)

## 2016-10-30 LAB — PROTIME-INR
INR: 1.05
Prothrombin Time: 13.6 seconds (ref 11.4–15.2)

## 2016-10-30 LAB — COMPREHENSIVE METABOLIC PANEL
ALT: 14 U/L — ABNORMAL LOW (ref 17–63)
AST: 24 U/L (ref 15–41)
Albumin: 4.1 g/dL (ref 3.5–5.0)
Alkaline Phosphatase: 73 U/L (ref 38–126)
Anion gap: 11 (ref 5–15)
BUN: 9 mg/dL (ref 6–20)
CO2: 24 mmol/L (ref 22–32)
Calcium: 9.4 mg/dL (ref 8.9–10.3)
Chloride: 103 mmol/L (ref 101–111)
Creatinine, Ser: 1.07 mg/dL (ref 0.61–1.24)
GFR calc Af Amer: >60 mL/min (ref >60)
GFR calc non Af Amer: >60 mL/min (ref >60)
Glucose, Bld: 123 mg/dL — ABNORMAL HIGH (ref 65–99)
Potassium: 3.2 mmol/L — ABNORMAL LOW (ref 3.5–5.1)
Sodium: 138 mmol/L (ref 135–145)
Total Bilirubin: 1.2 mg/dL (ref 0.3–1.2)
Total Protein: 6.9 g/dL (ref 6.5–8.1)

## 2016-10-30 LAB — URINALYSIS, ROUTINE W REFLEX MICROSCOPIC
Bilirubin Urine: NEGATIVE
Glucose, UA: NEGATIVE mg/dL
Ketones, ur: 5 mg/dL — AB
Leukocytes, UA: NEGATIVE
Nitrite: NEGATIVE
Protein, ur: 30 mg/dL — AB
Specific Gravity, Urine: 1.01 (ref 1.005–1.030)
Squamous Epithelial / LPF: NONE SEEN
pH: 7 (ref 5.0–8.0)

## 2016-10-30 LAB — URINE CULTURE: Culture: NO GROWTH

## 2016-10-30 LAB — I-STAT TROPONIN, ED: Troponin i, poc: 0.01 ng/mL (ref 0.00–0.08)

## 2016-10-30 LAB — LIPASE, BLOOD: Lipase: 21 U/L (ref 11–51)

## 2016-10-30 MED ORDER — ONDANSETRON HCL 4 MG/2ML IJ SOLN
4.0000 mg | Freq: Once | INTRAMUSCULAR | Status: AC
  Administered 2016-10-30: 4 mg via INTRAVENOUS
  Filled 2016-10-30: qty 2

## 2016-10-30 MED ORDER — LORAZEPAM 2 MG/ML IJ SOLN
0.5000 mg | Freq: Once | INTRAMUSCULAR | Status: AC
  Administered 2016-10-30: 0.5 mg via INTRAVENOUS
  Filled 2016-10-30: qty 1

## 2016-10-30 MED ORDER — IBUPROFEN 600 MG PO TABS: 600.0000 mg | ORAL_TABLET | Freq: Four times a day (QID) | ORAL | 0 refills | Status: DC | PRN

## 2016-10-30 MED ORDER — MORPHINE SULFATE (PF) 4 MG/ML IV SOLN
4.0000 mg | Freq: Once | INTRAVENOUS | Status: AC
  Administered 2016-10-30: 4 mg via INTRAVENOUS
  Filled 2016-10-30: qty 1

## 2016-10-30 MED ORDER — IOPAMIDOL (ISOVUE-300) INJECTION 61%
INTRAVENOUS | Status: AC
  Filled 2016-10-30: qty 100

## 2016-10-30 MED ORDER — ONDANSETRON HCL 4 MG PO TABS: 4.0000 mg | ORAL_TABLET | Freq: Four times a day (QID) | ORAL | 0 refills | Status: DC | PRN

## 2016-10-30 MED ORDER — OXYCODONE-ACETAMINOPHEN 5-325 MG PO TABS: 1.0000 | ORAL_TABLET | Freq: Four times a day (QID) | ORAL | 0 refills | Status: DC | PRN

## 2016-10-30 MED ORDER — METOCLOPRAMIDE HCL 5 MG/ML IJ SOLN
10.0000 mg | Freq: Once | INTRAMUSCULAR | Status: AC
  Administered 2016-10-30: 10 mg via INTRAVENOUS
  Filled 2016-10-30: qty 2

## 2016-10-30 MED ORDER — KETOROLAC TROMETHAMINE 30 MG/ML IJ SOLN
30.0000 mg | Freq: Once | INTRAMUSCULAR | Status: AC
  Administered 2016-10-30: 30 mg via INTRAVENOUS
  Filled 2016-10-30: qty 1

## 2016-10-30 MED ORDER — SODIUM CHLORIDE 0.9 % IV BOLUS (SEPSIS)
1000.0000 mL | Freq: Once | INTRAVENOUS | Status: AC
  Administered 2016-10-30: 1000 mL via INTRAVENOUS

## 2016-10-30 NOTE — ED Provider Notes (Signed)
[place] EMERGENCY DEPARTMENT Provider Note   CSN: [id number] Arrival date & time: 10/30/16  1600     History   Chief Complaint Chief Complaint  Patient presents with  . Emesis    HPI Brandon [name]. is a 66 y.o. male.  The history is provided by the patient.  Emesis   This is a new problem. The current episode started 6 to 12 hours ago. The problem occurs 5 to 10 times per day. The problem has not changed since onset.The emesis has an appearance of stomach contents. There has been no fever. Associated symptoms include abdominal pain, chills and sweats. Pertinent negatives include no cough, no fever and no URI.    66 year old male who presents with intractable nausea and vomiting. He has a history of persistent atrial fibrillation and bilateral PEs on Coumadin, SVT. Recent history of ventral hernia repair 6 weeks ago. He is also postop day 2 from shock wave lithotripsy for a right ureteral stone. He was discharged home yesterday and reports feeling well with pain and nausea and vomiting well controlled. He woke up at 6 AM this morning with intractable nausea and vomiting. Feels generalized abdominal discomfort. Had a normal bowel movement earlier this morning. Denies any diarrhea, fevers, chest pain or difficulty breathing, dysuria, urinary frequency or severe hematuria. He was unable to take oral antiemetics.   Past Medical History:  Diagnosis Date  . Anxiety   . Arthritis   . Benign neoplasm of colon   . Benign prostatic hyperplasia with urinary obstruction   . Bilateral pulmonary embolism ([place]) 3/14   admitted to [facility],  treated with Xarelto  . Cataract   . Chronic pain   . History of kidney stones   . History of pulmonary embolism 03/19/2012  . Hyperlipemia   . Hypertension   . Insomnia   . Major depressive disorder, recurrent episode ([place])   . Mitral regurgitation 04/24/2013   Mild by TEE  . Obesity   . OSA (obstructive sleep apnea)    noncompliant with CPAP.  07/27/13- awaiting a CPAP- unable to tolerate mask  . Osteoporosis   . Persistent atrial fibrillation ([place])   . Restless leg syndrome    takes gabapentin  . Sciatica   . SVT (supraventricular tachycardia) ([place]) 03/07/2016  . Thrombus of left atrial appendage 03/09/2013  . Ventral hernia, unspecified, without mention of obstruction or gangrene    right abdominal wall    Patient Active Problem List   Diagnosis Date Noted  . BPH (benign prostatic hyperplasia) 10/21/2016  . History of kidney stones 10/21/2016  . Primary hypertension 10/21/2016  . MDD (major depressive disorder) 10/21/2016  . Obesity (BMI 30-39.9) 03/07/2016  . Alcohol abuse 03/07/2016  . Paroxysmal atrial fibrillation (HCC)   . Restless leg syndrome 02/08/2014  . Current use of long term anticoagulation   . Obstructive sleep apnea 03/09/2013  . Hyperlipidemia 03/21/2012  . History of pulmonary embolism 03/19/2012    Past Surgical History:  Procedure Laterality Date  . CARDIOVERSION N/A 03/21/2012   Procedure: CARDIOVERSION;  Surgeon: Birdie Riddle, MD;  Location: [facility] ENDOSCOPY;  Service: Cardiovascular;  Laterality: N/A;  . CARDIOVERSION N/A 04/24/2013   Procedure: CARDIOVERSION;  Surgeon: Dorothy Spark, MD;  Location: [facility] ENDOSCOPY;  Service: Cardiovascular;  Laterality: N/A;  . CARDIOVERSION N/A 06/27/2015   Procedure: CARDIOVERSION;  Surgeon: Larey Dresser, MD;  Location: [place];  Service: Cardiovascular;  Laterality: N/A;  . CATARACT EXTRACTION    .  COLONOSCOPY W/ POLYPECTOMY    . ELECTROPHYSIOLOGIC STUDY N/A 07/30/2015   Procedure: Atrial Fibrillation Ablation;  Surgeon: Thompson Grayer, MD;  Location: [place] CV LAB;  Service: Cardiovascular;  Laterality: N/A;  . EXTRACORPOREAL SHOCK WAVE LITHOTRIPSY Right 10/29/2016   Procedure: RIGHT EXTRACORPOREAL SHOCK WAVE LITHOTRIPSY (ESWL);  Surgeon: Alexis Frock, MD;  Location: [facility] ORS;  Service: Urology;  Laterality: Right;  . EYE  SURGERY Right    cataract  . FEMUR FRACTURE SURGERY    . HERNIA REPAIR  07/06/2011  . INGUINAL HERNIA REPAIR Right 07/28/2013   Procedure: RIGHT INGUINAL HERNIA REPAIR;  Surgeon: Imogene Burn. Georgette Dover, MD;  Location: [place];  Service: General;  Laterality: Right;  . INGUINAL HERNIA REPAIR Right 09/22/2016   Procedure: LAPAROSCOPIC RIGHT INGUINAL HERNIA;  Surgeon: Kinsinger, Arta Bruce, MD;  Location: [facility] ORS;  Service: General;  Laterality: Right;  With MESH  . INSERTION OF MESH Right 07/28/2013   Procedure: INSERTION OF MESH;  Surgeon: Imogene Burn. Georgette Dover, MD;  Location: [place];  Service: General;  Laterality: Right;  . KNEE ARTHROSCOPY     left  . REPLACEMENT TOTAL KNEE Left   . ROTATOR CUFF REPAIR     right  . ROTATOR CUFF REPAIR Left 03/08/2014   DR SUPPLE  . SHOULDER ARTHROSCOPY WITH ROTATOR CUFF REPAIR AND SUBACROMIAL DECOMPRESSION Left 03/08/2014   Procedure: LEFT SHOULDER ARTHROSCOPY WITH ROTATOR CUFF REPAIR/SUBACROMIAL DECOMPRESSION/DISTAL CLAVICLE RESECTION;  Surgeon: Marin Shutter, MD;  Location: [place];  Service: Orthopedics;  Laterality: Left;  . TEE WITHOUT CARDIOVERSION N/A 03/21/2012   Procedure: TRANSESOPHAGEAL ECHOCARDIOGRAM (TEE);  Surgeon: Birdie Riddle, MD;  Location: [place];  Service: Cardiovascular;  Laterality: N/A;  . TEE WITHOUT CARDIOVERSION N/A 04/24/2013   Procedure: TRANSESOPHAGEAL ECHOCARDIOGRAM (TEE);  Surgeon: Dorothy Spark, MD;  Location: [place];  Service: Cardiovascular;  Laterality: N/A;  . TEE WITHOUT CARDIOVERSION N/A 07/29/2015   Procedure: TRANSESOPHAGEAL ECHOCARDIOGRAM (TEE);  Surgeon: Fay Records, MD;  Location: [facility] ENDOSCOPY;  Service: Cardiovascular;  Laterality: N/A;  . TONSILLECTOMY         Home Medications    Prior to Admission medications   Medication Sig Start Date End Date Taking? Authorizing Provider  acetaminophen (TYLENOL) 325 MG tablet You can take 2 tablets every 4 hours as need and alternate with the prescribed pain medicine,  which also has Tylenol.    DO NOT TAKE MORE THAN 4000 MG OF TYLENOL PER DAY.  IT CAN HARM YOUR LIVER.  TYLENOL (ACETAMINOPHEN) IS ALSO IN YOUR PRESCRIPTION PAIN MEDICATION.  YOU HAVE TO COUNT IT IN YOUR DAILY TOTAL. 09/23/16  Yes Earnstine Regal, PA-C  amLODipine (NORVASC) 5 MG tablet Take 1 tablet (5 mg total) by mouth daily. 10/26/16  Yes Allred, Jeneen Rinks, MD  carvedilol (COREG) 25 MG tablet Take 1 tablet (25 mg total) by mouth 2 (two) times daily with a meal. 10/26/16  Yes Allred, Jeneen Rinks, MD  enoxaparin (LOVENOX) 120 MG/0.8ML injection Inject 0.8 mLs into the skin every 12 (twelve) hours. 10/21/16  Yes [provider]  escitalopram (LEXAPRO) 20 MG tablet Take 1 tablet (20 mg total) by mouth daily. 10/21/16  Yes Brockway Bing, DO  losartan (COZAAR) 100 MG tablet Take 1 tablet (100 mg total) by mouth daily. 10/26/16  Yes Allred, Jeneen Rinks, MD  ondansetron (ZOFRAN) 4 MG tablet Take 1 tablet (4 mg total) by mouth every 8 (eight) hours as needed for nausea or vomiting. 10/29/16  Yes Alexis Frock, MD  oxyCODONE-acetaminophen (ROXICET) 5-325 MG  tablet Take 1 tablet by mouth every 6 (six) hours as needed for severe pain. After Lithotripsy 10/29/16 10/29/17 Yes Alexis Frock, MD  spironolactone (ALDACTONE) 25 MG tablet Take 1 tablet (25 mg total) by mouth daily. 10/26/16  Yes Allred, Jeneen Rinks, MD  tamsulosin (FLOMAX) 0.4 MG CAPS capsule Take 0.4 mg by mouth 2 (two) times daily.    Yes [provider]  warfarin (COUMADIN) 5 MG tablet Take 5 mg by mouth daily. 10/12/16  Yes [provider]  ibuprofen (ADVIL,MOTRIN) 600 MG tablet Take 1 tablet (600 mg total) by mouth every 6 (six) hours as needed for mild pain or moderate pain. 10/30/16   Forde Dandy, MD  ondansetron (ZOFRAN) 4 MG tablet Take 1 tablet (4 mg total) by mouth every 6 (six) hours as needed for nausea or vomiting. 10/30/16   Forde Dandy, MD  oxyCODONE-acetaminophen (PERCOCET/ROXICET) 5-325 MG tablet Take 1 tablet by  mouth every 6 (six) hours as needed for moderate pain or severe pain. 10/30/16   Forde Dandy, MD    Family History Family History  Problem Relation Age of Onset  . Cancer Father        bone  . Heart failure Mother   . Hypertension Mother   . Asthma Mother   . Cancer Paternal Grandmother        ovarian  . CVA Other        Fam Hx of multiple myeloma  . Diabetes Other        Fam Hx of DM    Social History Social History  Substance Use Topics  . Smoking status: Former Smoker    Packs/day: 0.50    Years: 5.00    Types: Cigarettes  . Smokeless tobacco: Never Used  . Alcohol use No     Comment: Former EtOH abuse, stopped 06/2016     Allergies   Ace inhibitors and Albuterol   Review of Systems Review of Systems  Constitutional: Positive for chills. Negative for fever.  Respiratory: Negative for cough.   Gastrointestinal: Positive for abdominal pain and vomiting.  Genitourinary: Negative for difficulty urinating and dysuria.  All other systems reviewed and are negative.    Physical Exam Updated Vital Signs BP (!) 144/99   Pulse 84   Temp 98.4 F (36.9 C) (Oral)   Resp 16   SpO2 93%   Physical Exam Physical Exam  Nursing note and vitals reviewed. Constitutional: Well developed, well nourished, appears fatigued and tired, but non-toxic, and in no acute distress Head: Normocephalic and atraumatic.  Mouth/Throat: Oropharynx is clear and dry mucous membranes.  Neck: Normal range of motion. Neck supple.  Cardiovascular: Normal rate and regular rhythm.   Pulmonary/Chest: Effort normal and breath sounds normal.  Abdominal: Soft. There is generalized tenderness. There is no rebound and no guarding.  Musculoskeletal: No obvious deformities.  Neurological: Alert, no facial droop, fluent speech, moves all extremities symmetrically Skin: Skin is warm and dry.  Psychiatric: Cooperative   ED Treatments / Results  Labs (all labs ordered are listed, but only abnormal  results are displayed) Labs Reviewed  COMPREHENSIVE METABOLIC PANEL - Abnormal; Notable for the following:       Result Value   Potassium 3.2 (*)    Glucose, Bld 123 (*)    ALT 14 (*)    All other components within normal limits  URINALYSIS, ROUTINE W REFLEX MICROSCOPIC - Abnormal; Notable for the following:    Hgb urine dipstick MODERATE (*)  Ketones, ur 5 (*)    Protein, ur 30 (*)    Bacteria, UA RARE (*)    All other components within normal limits  CBC WITH DIFFERENTIAL/PLATELET  LIPASE, BLOOD  PROTIME-INR  I-STAT TROPONIN, ED    EKG  EKG Interpretation  Date/Time:  Friday October 30 2016 16:50:12 EDT Ventricular Rate:  100 PR Interval:    QRS Duration: 100 QT Interval:  361 QTC Calculation: 466 R Axis:   -65 Text Interpretation:  Sinus tachycardia Ventricular bigeminy LAD, consider left anterior fascicular block other than multiple PVCs and faster rate  no significant change  Confirmed by Brantley Stage [id number]) on 10/30/2016 5:04:06 PM       Radiology Ct Abdomen Pelvis Wo Contrast  Result Date: 10/30/2016 CLINICAL DATA:  Right flank pain. Lithotripsy yesterday with persistent pain. EXAM: CT ABDOMEN AND PELVIS WITHOUT CONTRAST TECHNIQUE: Multidetector CT imaging of the abdomen and pelvis was performed following the standard protocol without IV contrast. COMPARISON:  Radiographs 10/30/2016.  CT 09/21/2016 and 09/19/2016. FINDINGS: Lower chest: Similar mild dependent atelectasis at both lung bases. No significant pleural or pericardial effusion. Aortic atherosclerosis noted. Hepatobiliary: The liver remains unremarkable as imaged in the noncontrast state. No evidence of gallstones, gallbladder wall thickening or biliary dilatation. Pancreas: Atrophied without focal abnormality, ductal dilatation or surrounding inflammation. Spleen: Normal in size without focal abnormality. Adrenals/Urinary Tract: Both adrenal glands appear normal. The left kidney appears normal. There are stable  small calculi in the upper and lower poles of the right kidney. There is stable perinephric soft tissue stranding and moderate hydronephrosis on the right. The dominant right ureteral calculus has passed into the distal ureter, just above the ureterovesical junction. This measures 5 mm on image 90. Just superior to this, there are 2 tiny stone fragments, best seen on sagittal image 75. No evidence of bladder calculus. Stomach/Bowel: No evidence of bowel wall thickening, distention or surrounding inflammatory change. Mild sigmoid colon diverticular changes. Vascular/Lymphatic: There are no enlarged abdominal or pelvic lymph nodes. Mild aortic and branch vessel atherosclerosis. Reproductive: Stable appearance of the prostate gland and seminal vesicles. Other: Interval repair of right inguinal hernia. There is soft tissue stranding in this area, but no residual herniated small bowel or focal fluid collection. Musculoskeletal: No acute or significant osseous findings. Mild degenerative changes in the spine. Posttraumatic deformity of the proximal left femur. IMPRESSION: 1. Partially obstructing 5 mm calculus in the distal right ureter. There are additional small stone fragments in the right ureter. 2. Stable hydronephrosis and perinephric soft tissue stranding on the right. Stable small right renal calculi. Normal appearance of the left kidney. 3. Interval right inguinal herniorrhaphy. Electronically Signed   By: Richardean Sale M.D.   On: 10/30/2016 19:24   Dg Abd 1 View  Result Date: 10/29/2016 CLINICAL DATA:  Right ureteral stone . EXAM: ABDOMEN - 1 VIEW COMPARISON:  KUB 10/02/2016.  CT 09/21/2016. FINDINGS: Right mid ureteral stone again noted in unchanged position. Tiny calcifications in the pelvis consistent phleboliths. Distended loops of small bowel with normal colonic gas pattern and stool in the colon noted. Findings most likely secondary to adynamic ileus. No free air. Thoracolumbar spine degenerative  change. No acute bony abnormality . IMPRESSION: Persistent right mid ureteral stone again noted. No interim change. Distended loops of small bowel with normal colonic gas pattern and stool in the colon again noted. Findings most likely related adynamic ileus. Follow-up exam suggested to demonstrate resolution . Electronically Signed   By: Marcello Moores  Register  On: 10/29/2016 07:59   Dg Abdomen Acute W/chest  Addendum Date: 10/30/2016   ADDENDUM REPORT: 10/30/2016 17:51 ADDENDUM: Old right clavicle fracture. Electronically Signed   By: Donavan Foil M.D.   On: 10/30/2016 17:51   Result Date: 10/30/2016 CLINICAL DATA:  Nausea vomiting history of hernia repair EXAM: DG ABDOMEN ACUTE W/ 1V CHEST COMPARISON:  10/29/2016, CT 09/21/2016, 10/23/2015 FINDINGS: Single-view chest demonstrates low lung volumes with mild atelectasis or scar at the right base. No consolidation or effusion. Stable cardiomediastinal silhouette. Supine and decubitus views of the abdomen demonstrate no free air. Nonobstructed gas pattern. Multiple phleboliths in the pelvis. CT demonstrated intrarenal calculi are not well visualized radiographically. IMPRESSION: 1. Low lung volumes with right basilar atelectasis 2. Nonobstructed gas pattern Electronically Signed: By: Donavan Foil M.D. On: 10/30/2016 17:41    Procedures Procedures (including critical care time)  Medications Ordered in ED Medications  iopamidol (ISOVUE-300) 61 % injection (not administered)  sodium chloride 0.9 % bolus 1,000 mL (1,000 mLs Intravenous New Bag/Given 10/30/16 1643)  ondansetron (ZOFRAN) injection 4 mg (4 mg Intravenous Given 10/30/16 1648)  morphine 4 MG/ML injection 4 mg (4 mg Intravenous Given 10/30/16 1650)  metoCLOPramide (REGLAN) injection 10 mg (10 mg Intravenous Given 10/30/16 1717)  ondansetron (ZOFRAN) injection 4 mg (4 mg Intravenous Given 10/30/16 1945)  LORazepam (ATIVAN) injection 0.5 mg (0.5 mg Intravenous Given 10/30/16 1946)  morphine 4  MG/ML injection 4 mg (4 mg Intravenous Given 10/30/16 1949)  ketorolac (TORADOL) 30 MG/ML injection 30 mg (30 mg Intravenous Given 10/30/16 2033)     Initial Impression / Assessment and Plan / ED Course  I have reviewed the triage vital signs and the nursing notes.  Pertinent labs & imaging results that were available during my care of the patient were reviewed by me and considered in my medical decision making (see chart for details).     Recent lithotripsy with sudden onset of severe low abdominal pain with nausea and vomiting. Afebrile and hemodynamically stable. Appears very uncomfortable on exam but was overall soft and nonsurgical abdomen. Differential includes SBO, retained kidney stone causing obstruction, colitis, post-op nausea vomiting.   UA shows persistent blood without evidence of infection. Blood work otherwise reassuring. Normal renal function. Initial x-ray of the abdomen shows no obstructive bowel gas pattern. Subsequent CT scan shows retained 5 mm ureteral stone causing hydronephrosis at the right UVJ. Initially pain not controlled with morphine, but after receiving Toradol states that his pain is completely gone and he would like to go home. Patient to call Dr. Zettie Pho office on Monday for follow-up. Strict return and follow-up instructions reviewed. He expressed understanding of all discharge instructions and felt comfortable with the plan of care.   Final Clinical Impressions(s) / ED Diagnoses   Final diagnoses:  Right ureteral stone    New Prescriptions New Prescriptions   IBUPROFEN (ADVIL,MOTRIN) 600 MG TABLET    Take 1 tablet (600 mg total) by mouth every 6 (six) hours as needed for mild pain or moderate pain.   ONDANSETRON (ZOFRAN) 4 MG TABLET    Take 1 tablet (4 mg total) by mouth every 6 (six) hours as needed for nausea or vomiting.   OXYCODONE-ACETAMINOPHEN (PERCOCET/ROXICET) 5-325 MG TABLET    Take 1 tablet by mouth every 6 (six) hours as needed for moderate  pain or severe pain.     Forde Dandy, MD 10/30/16 2138

## 2016-10-30 NOTE — ED Notes (Signed)
Pt to x-ray at this timie

## 2016-10-30 NOTE — Discharge Instructions (Signed)
You have a 5 mm kidney stone fragment in the right ureter about to pass into the bladder.  Take medications as prescribed.  Please call Dr. Tresa Moore on Monday  Return without fail for worsening symptoms, including fever, escalating pain, intractable vomiting, or any other symptoms concerning to you.

## 2016-10-30 NOTE — ED Notes (Signed)
Pt in CT at this time.

## 2016-10-30 NOTE — ED Triage Notes (Signed)
Per EMS:  Pt presents to ED for assessment after having a lithotripsy yesterday at [facility], and a hernia repair approx 1 month ago.  Today woke up to n/v.   Has not been able to keep any of his meds, food, or fluids down.  Hx of afib, with a rate of 80-120 with EMS.  4mg  of Zofran given en route, pt has some relief, but dry heaving upon arrival.

## 2016-10-30 NOTE — ED Notes (Signed)
Pt st's pain is much better and he is ready to go home

## 2016-10-30 NOTE — ED Notes (Signed)
Pt returned from x-ray.  Pt requesting pain med and more meds for nausea.  Dr. Oleta Mouse in to talk with pt
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
## 2016-11-04 ENCOUNTER — Ambulatory Visit (INDEPENDENT_AMBULATORY_CARE_PROVIDER_SITE_OTHER): Payer: Medicare Other | Admitting: *Deleted

## 2016-11-04 ENCOUNTER — Encounter: Payer: Self-pay | Admitting: Family Medicine

## 2016-11-04 ENCOUNTER — Ambulatory Visit (INDEPENDENT_AMBULATORY_CARE_PROVIDER_SITE_OTHER): Payer: Medicare Other | Admitting: Family Medicine

## 2016-11-04 VITALS — BP 104/78 | HR 68 | Temp 98.2°F | Ht 75.0 in | Wt 255.0 lb

## 2016-11-04 DIAGNOSIS — I208 Other forms of angina pectoris: Secondary | ICD-10-CM | POA: Diagnosis not present

## 2016-11-04 DIAGNOSIS — I480 Paroxysmal atrial fibrillation: Secondary | ICD-10-CM | POA: Diagnosis not present

## 2016-11-04 DIAGNOSIS — E782 Mixed hyperlipidemia: Secondary | ICD-10-CM

## 2016-11-04 DIAGNOSIS — Z5181 Encounter for therapeutic drug level monitoring: Secondary | ICD-10-CM

## 2016-11-04 DIAGNOSIS — G2581 Restless legs syndrome: Secondary | ICD-10-CM | POA: Diagnosis not present

## 2016-11-04 DIAGNOSIS — R768 Other specified abnormal immunological findings in serum: Secondary | ICD-10-CM | POA: Diagnosis not present

## 2016-11-04 LAB — POCT INR: INR: 1.3

## 2016-11-04 MED ORDER — GABAPENTIN 800 MG PO TABS: 800.0000 mg | ORAL_TABLET | Freq: Two times a day (BID) | ORAL | 3 refills | Status: DC

## 2016-11-04 MED ORDER — ATORVASTATIN CALCIUM 40 MG PO TABS: 40.0000 mg | ORAL_TABLET | Freq: Every day | ORAL | 3 refills | Status: DC
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
## 2016-11-04 NOTE — Assessment & Plan Note (Addendum)
Chronic.  Diagnosed in 2013.  Controlled with use of gabapentin 800 mg twice daily.  Patient requesting refill today.  Seems to be well-controlled with the use of gabapentin. - Refill provided for gabapentin 800 mg twice daily

## 2016-11-04 NOTE — Patient Instructions (Addendum)
Thank you for coming in to see [place] today. Please see below to review our plan for today's visit.  1.  I will call you when I get the results with your hepatitis C quantitative study. 2.  I have prescribed a new medication called atorvastatin.  You will take this medication daily.  If you begin to develop muscle aches, please discontinue this medication immediately and let me know.  I will see you in 2 months to recheck your LDL. 3.  I have sent in a refill for your gabapentin.  Please call the clinic at [phone] if your symptoms worsen or you have any concerns. It was my pleasure to see you. -- Harriet [place], [place], PGY-2

## 2016-11-04 NOTE — Assessment & Plan Note (Addendum)
Chronic.  LDL elevated during last visit.  No history of statin use.  Non-smoker.  BP remains well controlled.  ASCVD risk score 14.8%.  Has history of PAF on warfarin, obesity, HTN, OSA. - Will initiate atorvastatin 40 mg daily - RTC 2 months at which point we will recheck LDL level

## 2016-11-05 DIAGNOSIS — N201 Calculus of ureter: Secondary | ICD-10-CM | POA: Diagnosis not present

## 2016-11-06 ENCOUNTER — Other Ambulatory Visit: Payer: Self-pay | Admitting: Internal Medicine

## 2016-11-06 ENCOUNTER — Telehealth: Payer: Self-pay | Admitting: *Deleted

## 2016-11-06 ENCOUNTER — Telehealth: Payer: Self-pay | Admitting: Family Medicine

## 2016-11-06 LAB — HCV RNA QUANT RFLX ULTRA OR GENOTYP: HCV Quant Baseline: NOT DETECTED IU/mL

## 2016-11-06 MED ORDER — ENOXAPARIN SODIUM 120 MG/0.8ML ~~LOC~~ SOLN: 120.0000 mg | Freq: Two times a day (BID) | SUBCUTANEOUS | 1 refills | Status: DC

## 2016-11-06 NOTE — Telephone Encounter (Signed)
Returned a call to pt & he states he needed more Lovenox. Called pt & sent Lovenox injections to requested pharmacy. Pt will call back if any issues.

## 2016-11-06 NOTE — Telephone Encounter (Signed)
Contacted pt regarding negative HCV. No need for treatment given false positive HCV ab. Pt w/o questions or concerns.  Harriet Butte, [place], PGY-2

## 2016-11-09 ENCOUNTER — Encounter (INDEPENDENT_AMBULATORY_CARE_PROVIDER_SITE_OTHER): Payer: Medicare Other | Admitting: *Deleted

## 2016-11-09 ENCOUNTER — Telehealth: Payer: Self-pay | Admitting: *Deleted

## 2016-11-09 DIAGNOSIS — I480 Paroxysmal atrial fibrillation: Secondary | ICD-10-CM

## 2016-11-09 DIAGNOSIS — Z5181 Encounter for therapeutic drug level monitoring: Secondary | ICD-10-CM

## 2016-11-09 NOTE — Progress Notes (Signed)
This encounter was created in error - please disregard.

## 2016-11-09 NOTE — Telephone Encounter (Signed)
Pt was scheduled for an appt this morning with [facility]. Pt had to leave per front desk clerk. Walked downstairs to see if pt was in the lobby unable to locate as pt was gone. Minutes later called pt but had to leave a message on his voicemail as there was no answer. Pt is currently on Lovenox & needs an INR. Will await for pt to call back to schedule an appt.

## 2016-11-11 ENCOUNTER — Telehealth: Payer: Self-pay

## 2016-11-11 DIAGNOSIS — Z1211 Encounter for screening for malignant neoplasm of colon: Secondary | ICD-10-CM | POA: Diagnosis not present

## 2016-11-11 DIAGNOSIS — I10 Essential (primary) hypertension: Secondary | ICD-10-CM | POA: Diagnosis not present

## 2016-11-11 DIAGNOSIS — I482 Chronic atrial fibrillation: Secondary | ICD-10-CM | POA: Diagnosis not present

## 2016-11-11 NOTE — Telephone Encounter (Signed)
   [facility] Pre-operative Risk Assessment    Request for surgical clearance:  1. What type of surgery is being performed? colonoscopy   2. When is this surgery scheduled? 12/01/2016   3. Are there any medications that need to be held prior to surgery and how long?coumadin   4. Practice name and name of physician performing surgery? [facility] Carol Ada, MD   5. What is your office phone and fax number? [phone]  fac [phone]   6. Anesthesia type (None, local, MAC, general) ? Propofol   Tod Persia 11/11/2016, 2:16 PM  _________________________________________________________________   (provider comments below)

## 2016-11-13 NOTE — Telephone Encounter (Signed)
    Chart reviewed as part of pre-operative protocol coverage. Patient was contacted 11/13/2016 in reference to pre-operative risk assessment for pending surgery as outlined below.  Brandon B Wissmann Jr. was last seen on 10/26/2016  by Dr. Rayann Heman.    Left voice mail to call back. Will route to pharmacy for anticoagulation review.   [place], PA 11/13/2016, 2:46 PM

## 2016-11-13 NOTE — Telephone Encounter (Signed)
Follow Up:; ° ° °Returning your call. °

## 2016-11-16 ENCOUNTER — Telehealth: Payer: Self-pay | Admitting: *Deleted

## 2016-11-16 NOTE — Telephone Encounter (Signed)
    Chart reviewed as part of pre-operative protocol coverage. Pt recently seen by Dr. Rayann Heman 10/26/16 at which time he was doing well. Dr. Jackalyn Lombard note indicates knowledge of patient awaiting lithotripsy. Although clearance request is for colonoscopy, Dr. Jackalyn Lombard note did not specify any hesitation with regard to cardiac concern for upcoming lithotripsy so will forward this request to him to clarify clearance for colonoscopy as well. Dr. Rayann Heman, please forward reply to P CV DIV PREOP pool.   Pt also has coumadin clinic appt tomorrow.  Charlie Pitter, PA-C  11/16/2016, 5:15 PM

## 2016-11-16 NOTE — Telephone Encounter (Signed)
Patient with history of LAA thrombus, will need lovenox bridge to hold warfarin for upcoming colonoscopy.   Will inform coumadin clinic to schedule this.

## 2016-11-16 NOTE — Telephone Encounter (Signed)
-----   Message from Rockne Menghini, [place] sent at 11/16/2016  8:19 AM EST ----- Looks as though Brandon [name] missed last INR appt.  Need to schedule him for INR and lovenox instructions - pt to have colonoscopy on 11/27.

## 2016-11-16 NOTE — Telephone Encounter (Signed)
Telephoned pt and he is scheduled for a colonoscopy on 12/01/16. When asked about his past due appt on 11/09/16 because he was on lovenox he states he stopped lovenox on 12/04/16 and just didn't want to come in, he was tired of coming to Coumadin clinic. Thus, told pt he needs to have an INR check asap due to the fact he was on lovenox and he didn't have the appt on 11/09/16, thus appt scheduled for tomorrow.

## 2016-11-17 ENCOUNTER — Ambulatory Visit (INDEPENDENT_AMBULATORY_CARE_PROVIDER_SITE_OTHER): Payer: Medicare Other | Admitting: *Deleted

## 2016-11-17 DIAGNOSIS — Z86711 Personal history of pulmonary embolism: Secondary | ICD-10-CM | POA: Diagnosis not present

## 2016-11-17 DIAGNOSIS — I480 Paroxysmal atrial fibrillation: Secondary | ICD-10-CM

## 2016-11-17 DIAGNOSIS — Z5181 Encounter for therapeutic drug level monitoring: Secondary | ICD-10-CM

## 2016-11-17 LAB — POCT INR: INR: 2

## 2016-11-17 MED ORDER — ENOXAPARIN SODIUM 120 MG/0.8ML ~~LOC~~ SOLN: 120.0000 mg | Freq: Two times a day (BID) | SUBCUTANEOUS | 1 refills | Status: DC

## 2016-11-17 NOTE — Patient Instructions (Addendum)
Pt has been taking coumadin 1 tablet daily except 1.5 tablets on Thursdays so this dose continued Is scheduled for colonoscopy on 12/01/2016 Last day to take coumadin is Nov 21st  See pt instructions. Recheck INR on Monday Dec 3rd  Call with any questions or changes .  Coumadin Clinic [phone]  Nov 21,2018 last day to take coumadin    Nov 22nd : No Coumadin or Lovenox.  Nov 23rd : Inject Lovenox 120mg  in the fatty abdominal tissue at least 2 inches from the belly button twice a day about 12 hours apart, 7am and 7pm rotate sites. No Coumadin.  Nov 24th: Inject Lovenox in the fatty tissue every 12 hours, 7am and 7pm. No Coumadin.  Nov 25th: Inject Lovenox in the fatty tissue every 12 hours, 7am and 7pm. No Coumadin.  Nov 26: Inject Lovenox in the fatty tissue in the morning at 7 am (No PM dose). No Coumadin.  Nov 27th : Procedure Day - No Lovenox - Resume Coumadin in the evening or as directed by doctor (take an extra half tablet with usual dose for 2 days then resume normal dose).  Nov 28th: Resume Lovenox inject in the fatty tissue every 12 hours and take Coumadin.  Nov 29th: Inject Lovenox in the fatty tissue every 12 hours and take Coumadin.  Nov 30th: Inject Lovenox in the fatty tissue every 12 hours and take Coumadin.  Dec 1st : Inject Lovenox in the fatty tissue every 12 hours and take Coumadin.  Dec 2nd: Inject Lovenox in the fatty tissue every 12 hours and take Coumadin.  Dec 3rd: Coumadin appt to check INR.   Age 66 Wt 113.6 kg  SrCr 1.07 on 10/30/2016 CrCl 106.16  Reviewed with pt Lovenox instructions and pt states he thinks has 8 injections left at home instructed to be sure lovenox 120 mg and did order refill on lovenox

## 2016-11-20 NOTE — Telephone Encounter (Signed)
Clearance routed via Epic fax to requesting provider

## 2016-11-20 NOTE — Telephone Encounter (Addendum)
    Dr.Allred was contacted.  He indicates that no further workup is indicated prior to the colonoscopy.  He has been seen by the Coumadin clinic with holding the Coumadin and Lovenox bridging arranged.  Therefore, [facility] would be at acceptable risk for the planned procedure without further cardiovascular testing.   I will route this recommendation to the requesting party via Epic fax function and remove from pre-op pool.  Please call with questions.  Rosaria Ferries, PA-C 11/20/2016, 2:17 PM

## 2016-11-25 DIAGNOSIS — N210 Calculus in bladder: Secondary | ICD-10-CM | POA: Diagnosis not present

## 2016-12-01 ENCOUNTER — Other Ambulatory Visit: Payer: Self-pay | Admitting: *Deleted

## 2016-12-01 DIAGNOSIS — Z86010 Personal history of colonic polyps: Secondary | ICD-10-CM | POA: Diagnosis not present

## 2016-12-01 DIAGNOSIS — K5730 Diverticulosis of large intestine without perforation or abscess without bleeding: Secondary | ICD-10-CM | POA: Diagnosis not present

## 2016-12-01 DIAGNOSIS — D123 Benign neoplasm of transverse colon: Secondary | ICD-10-CM | POA: Diagnosis not present

## 2016-12-01 DIAGNOSIS — Z1211 Encounter for screening for malignant neoplasm of colon: Secondary | ICD-10-CM | POA: Diagnosis not present

## 2016-12-01 DIAGNOSIS — K635 Polyp of colon: Secondary | ICD-10-CM | POA: Diagnosis not present

## 2016-12-01 DIAGNOSIS — D124 Benign neoplasm of descending colon: Secondary | ICD-10-CM | POA: Diagnosis not present

## 2016-12-02 ENCOUNTER — Encounter: Payer: Self-pay | Admitting: Family Medicine

## 2016-12-02 DIAGNOSIS — K5730 Diverticulosis of large intestine without perforation or abscess without bleeding: Secondary | ICD-10-CM | POA: Insufficient documentation

## 2016-12-02 MED ORDER — CARVEDILOL 25 MG PO TABS: 25.0000 mg | ORAL_TABLET | Freq: Two times a day (BID) | ORAL | 3 refills | Status: DC

## 2016-12-02 MED ORDER — TAMSULOSIN HCL 0.4 MG PO CAPS: 0.4000 mg | ORAL_CAPSULE | Freq: Two times a day (BID) | ORAL | 3 refills | Status: DC

## 2016-12-02 MED ORDER — AMLODIPINE BESYLATE 5 MG PO TABS: 5.0000 mg | ORAL_TABLET | Freq: Every day | ORAL | 3 refills | Status: DC

## 2016-12-07 ENCOUNTER — Ambulatory Visit (INDEPENDENT_AMBULATORY_CARE_PROVIDER_SITE_OTHER): Payer: Medicare Other | Admitting: *Deleted

## 2016-12-07 DIAGNOSIS — Z5181 Encounter for therapeutic drug level monitoring: Secondary | ICD-10-CM

## 2016-12-07 DIAGNOSIS — Z86711 Personal history of pulmonary embolism: Secondary | ICD-10-CM | POA: Diagnosis not present

## 2016-12-07 DIAGNOSIS — I480 Paroxysmal atrial fibrillation: Secondary | ICD-10-CM

## 2016-12-07 LAB — POCT INR: INR: 2.2

## 2016-12-07 NOTE — Patient Instructions (Signed)
Stop lovenox and continue same dose of coumadin  1 tablet daily except 1.5 tablets on Thursdays . Recheck INR in 2 weeks  Call with any questions or changes .  Coumadin Clinic [phone]

## 2016-12-08 ENCOUNTER — Ambulatory Visit (INDEPENDENT_AMBULATORY_CARE_PROVIDER_SITE_OTHER): Payer: Medicare Other | Admitting: Family Medicine

## 2016-12-08 ENCOUNTER — Encounter: Payer: Self-pay | Admitting: Licensed Clinical Social Worker

## 2016-12-08 ENCOUNTER — Other Ambulatory Visit: Payer: Self-pay

## 2016-12-08 ENCOUNTER — Encounter: Payer: Self-pay | Admitting: Family Medicine

## 2016-12-08 VITALS — BP 120/88 | HR 62 | Temp 98.1°F | Ht 75.0 in | Wt 254.8 lb

## 2016-12-08 DIAGNOSIS — F331 Major depressive disorder, recurrent, moderate: Secondary | ICD-10-CM | POA: Diagnosis not present

## 2016-12-08 DIAGNOSIS — I208 Other forms of angina pectoris: Secondary | ICD-10-CM

## 2016-12-08 DIAGNOSIS — J209 Acute bronchitis, unspecified: Secondary | ICD-10-CM

## 2016-12-08 MED ORDER — METHYLPREDNISOLONE ACETATE 80 MG/ML IJ SUSP
80.0000 mg | Freq: Once | INTRAMUSCULAR | Status: AC
  Administered 2016-12-08: 80 mg via INTRAMUSCULAR

## 2016-12-08 MED ORDER — BUPROPION HCL 75 MG PO TABS: 75.0000 mg | ORAL_TABLET | Freq: Three times a day (TID) | ORAL | 2 refills | Status: DC

## 2016-12-08 NOTE — Progress Notes (Signed)
  Type of Service: Integrated Behavioral Health warm handoff  Estimate time:35 minutes : Interpreter:No.  SUBJECTIVE: Brandon [name]. is a 66 y.o. male referred by Dr. Kris Mouton for: symptoms of  depression and PHQ-9 of 18 which is an indication of severe depression.  Patient is pleasant, tearful at time and engaged in conversation. Reports feeling down and depressed for years due to relationship difficulties with his wife, and children as well as failing health issues with chronic medical conditions. Patient reports going to therapy in the past but did not feel that it helped.  He is not willing to go again. States he is not willing to do the work that is required "I want to just take a pill and make things better". Patient is currently taking Lexapro, MD added Wellburtrin today.  LIFE CONTEXT:  Brandon [name] lives alone ,has no support system  Work /Fun: retired Press photographer, enjoys music, walks one mile a day and enjoys yard work  Life changes: managing chronic health condition  GOALS: Patient will reduce symptoms of: depression, and increase  ability UJ:WJXBJY skills and self-management skills, will also Increase healthy adjustment to current life circumstances. INTERVENTION: Reflective listening, Behavioral Therapy (Relaxed breathing) and Motivational Interviewing  ISSUES DISCUSSED: Integrated care services, support system, previous and current coping skills, community support options, things patient enjoy or use to enjoy doing, Goals and demonstration of relaxed breathing.    ASSESSMENT:Patient currently experiencing symptoms of depression.  Symptoms exacerbated by isolation, lack of relationship with family and chronic health concerns. Patient may benefit from, and is in agreement to receive further assessment and brief therapeutic interventions to assist with managing his symptoms.  PLAN:   1.Patient will F/U with LCSW in two weeks when he returns to see PCP  2. LCSW will F/U with  phone call if patient does not come in for F/U  3. Behavioral recommendations: relaxed breathing  4. Referral:Integrated Behavioral Health Services (In Clinic), F/U  Warm Hand Off Completed.     Brandon Lanius, LCSW Licensed Clinical Social Worker [place]   [phone] 1:48 PM

## 2016-12-08 NOTE — Patient Instructions (Addendum)
It was nice meeting you today! We talked about your bronchitis and your depression today. We gave you a shot of solumedrol which is a long acting steroid. I do not feel like antibiotics is indicated at this time. If you do not feel any better please come back in 1 week and we can re-evaluate how you are doing. I am also starting you on a medication called Wellbutrin for your depression. It is 75mg  three times per day. Please come back in around 2-3 weeks to see how you are doing if you are doing well between visits.

## 2016-12-17 NOTE — Progress Notes (Signed)
Subjective   Patient ID: Brandon [name].    DOB: Jul 10, 1950, 66 y.o. male   MRN: [id number]  CC: "Depression follow-up"  HPI: Brandon [name]. is a 66 y.o. male who presents to clinic today for the following:  Depression: Patient states his symptoms have worsened since his last visit.  He was started on bupropion but feels like it makes his symptoms worse, primarily his fatigue and apathy.  He has been on his Lexapro at max dose for approximately 10 years.  He has met with behavioral health once and is interested in continuing.  He does express frustration with multiple medical problems, most notably his persistent kidney stone which she has not yet passed, and multiple medications.  He denies symptoms of anxiety or SI/HI.  BPH: Currently taking max dose Flomax for the last 4 years.  Currently being followed by urology for nephrolithiasis which she has not yet passed. Next appointment is in February.  He frequently has to use the bathroom at night approximately 2-5 times.  Insomnia: Ongoing issue for many years.  He attributes this to having to get up to use the bathroom frequently at night.  He states it is not falling asleep that is the issue but staying asleep.  ROS: see HPI for pertinent.  Brandon [name]:  PAF (on warfarin), HTN, HLD, h/o EtOH abuse, MDD, kidney stone, BPH, OSA, obesity, h/o pulmonary embolism. Surgical history R-inguinal hernia x3, L-rotator cuff repair, L-total knee, cataract, cardioversion 3. Family history HF, HTN, asthma, bone cancer (father). Smoking status reviewed. Medications reviewed.  Objective   BP 110/80 (BP Location: Left Arm, Patient Position: Sitting, Cuff Size: Normal)   Pulse 71   Temp 98 F (36.7 C) (Oral)   Ht _0  (1.905 m)   Wt 250 lb (113.4 kg)   SpO2 96%   BMI 31.25 kg/m  Vitals and nursing note reviewed.  General: elderly frustrated male, well nourished, well developed with non-toxic appearance HEENT: normocephalic, atraumatic, moist mucous  membranes Cardiovascular: regular rate and rhythm without murmurs, rubs, or gallops Lungs: clear to auscultation bilaterally with normal work of breathing Abdomen: soft, non-tender, non-distended, normoactive bowel sounds Skin: warm, dry, no rashes or lesions, cap refill < 2 seconds Extremities: warm and well perfused, normal tone, no edema Psych: dysthymic mood, congruent affect  Assessment & Plan   Insomnia due to medical condition Chronic.  Poorly controlled.  Has diagnosis of BPH only on max dose tamulosin. - Given instructions for proper sleep hygiene - See plan for BPH - Will defer sleep agents at this time  BPH (benign prostatic hyperplasia) Chronic.  Poorly controlled.  Long standing use of max dose tamsulosin.  No history of finasteride use.  Currently has nephrolithiasis being managed by urology. - Continue tamsulosin 0.8 mg daily, initiating finasteride 5 mg daily - Follow-up urology with next scheduled appointment in February  MDD (major depressive disorder) Chronic.  Severe based on PHQ 9 without signs of SI/HI.  Currently on max dose of escitalopram .  Followed by [facility].  Symptoms worse since adding bupropion.  Patient is not interested in continuing bupropion.  Suspect symptoms are likely exacerbated by current nephrolithiasis which has not yet passed along with multiple problems, notably uncontrolled BPH and poor sleep. - Discontinue bupropion and continue escitalopram 20 mg daily - Patient to follow Neoma Laming with [facility] services - We will defer changing depression medications at this time  No orders of the defined types were placed in this encounter.  Meds ordered this encounter  Medications  . finasteride (PROSCAR) 5 MG tablet    Sig: Take 1 tablet (5 mg total) by mouth daily.    Dispense:  90 tablet    Refill:  0    Harriet Butte, DO [place], PGY-2 12/23/2016, 11:30 AM

## 2016-12-21 ENCOUNTER — Ambulatory Visit (INDEPENDENT_AMBULATORY_CARE_PROVIDER_SITE_OTHER): Payer: Medicare Other

## 2016-12-21 DIAGNOSIS — J209 Acute bronchitis, unspecified: Secondary | ICD-10-CM | POA: Insufficient documentation

## 2016-12-21 DIAGNOSIS — I480 Paroxysmal atrial fibrillation: Secondary | ICD-10-CM | POA: Diagnosis not present

## 2016-12-21 DIAGNOSIS — Z5181 Encounter for therapeutic drug level monitoring: Secondary | ICD-10-CM

## 2016-12-21 LAB — POCT INR: INR: 2.4

## 2016-12-21 NOTE — Assessment & Plan Note (Signed)
Patient with no focal consolidation and seemed to improve on steroids. Do not think that antibiotics are indicated in this case. Will plan on giving shot of solumedrol. Patient to follow up in 4-5 days if he does not see any improvement. - no antibiotics indicated at this time - solumedrol 80mg  injection - follow up in 4-5 days if worsening

## 2016-12-21 NOTE — Patient Instructions (Signed)
Continue same dose of coumadin 1 tablet daily except 1.5 tablets on Thursdays . Recheck INR in 3 weeks Call with any questions or changes . Coumadin Clinic [phone]

## 2016-12-21 NOTE — Progress Notes (Cosign Needed)
   HPI 66 year old male who presents with cough, shortness of breath, wheezing, and feeling bad. He states that these symptoms are very similar to the same symptoms that landed him in the hospital one year ago. Per chart review he appeared to have a COPD exacerbation. He states that he felt much better with steroids and antibiotics last year. He states that he continues to feel a little worse every day and is hoping to stop getting worse in order to avoid a hospitalization. Patient was given albuterol in the hospital which put him in afib.  He also states that he has depression. He has a calculated PHQ-9 score of 6 which is up significantly from 2 in October of this year. He is on lexapro which he states helps some but is interested in further treatment. A warm hand off was given to behavioral health prior to his leaving the clinic.  CC: cough, depression   ROS:  Review of Systems  Constitutional: Negative for chills and fever.  HENT: Negative for congestion.   Eyes: Negative for pain, discharge and redness.  Respiratory: Positive for cough, sputum production and shortness of breath. Negative for wheezing.   Cardiovascular: Negative for chest pain and palpitations.  Gastrointestinal: Negative for abdominal pain, constipation, diarrhea, nausea and vomiting.  Genitourinary: Negative for dysuria and urgency.  Musculoskeletal: Negative for myalgias and neck pain.  Skin: Negative for itching and rash.  Neurological: Negative for dizziness and headaches.  Psychiatric/Behavioral: Positive for depression. Negative for substance abuse and suicidal ideas.    Review of Systems See HPI for ROS.   CC, SH/smoking status, and VS noted  Objective: BP 120/88   Pulse 62   Temp 98.1 F (36.7 C) (Oral)   Ht 6\' 3"  (1.905 m)   Wt 254 lb 12.8 oz (115.6 kg)   SpO2 96%   BMI 31.85 kg/m  Gen: NAD, alert, cooperative, and pleasant. HEENT: NCAT, EOMI, PERRL CV: RRR, no murmur Resp: scattered wheezes  with slight coarseness to breath sounds. No focal consolidations. Abd: SNTND, BS present, no guarding or organomegaly Ext: No edema, warm Neuro: Alert and oriented, Speech clear, No gross deficits   Assessment and plan:  Acute bronchitis Patient with no focal consolidation and seemed to improve on steroids. Do not think that antibiotics are indicated in this case. Will plan on giving shot of solumedrol. Patient to follow up in 4-5 days if he does not see any improvement. - no antibiotics indicated at this time - solumedrol 80mg  injection - follow up in 4-5 days if worsening  MDD (major depressive disorder) Is getting relief from lexapro. Will supplement with wellbutrin for synergy. Patient given anticipatory guidance for possible side effects including possible increase SI. Gave warm handoff to behavioral health. Please see note. - continue lexapro 20mg  - continue wellbutrin 75mg  tid - return to clinic in 2 weeks for check   No orders of the defined types were placed in this encounter.   Meds ordered this encounter  Medications  . buPROPion (WELLBUTRIN) 75 MG tablet    Sig: Take 1 tablet (75 mg total) by mouth 3 (three) times daily.    Dispense:  90 tablet    Refill:  2  . methylPREDNISolone acetate (DEPO-MEDROL) injection 80 mg     Guadalupe Dawn MD PGY-1 Family Medicine Resident 12/21/2016 12:10 PM

## 2016-12-21 NOTE — Assessment & Plan Note (Signed)
Is getting relief from lexapro. Will supplement with wellbutrin for synergy. Patient given anticipatory guidance for possible side effects including possible increase SI. Gave warm handoff to behavioral health. Please see note. - continue lexapro 20mg  - continue wellbutrin 75mg  tid - return to clinic in 2 weeks for check

## 2016-12-23 ENCOUNTER — Ambulatory Visit (INDEPENDENT_AMBULATORY_CARE_PROVIDER_SITE_OTHER): Payer: Medicare Other | Admitting: Family Medicine

## 2016-12-23 ENCOUNTER — Encounter: Payer: Self-pay | Admitting: Family Medicine

## 2016-12-23 ENCOUNTER — Ambulatory Visit (INDEPENDENT_AMBULATORY_CARE_PROVIDER_SITE_OTHER): Payer: Medicare Other | Admitting: Licensed Clinical Social Worker

## 2016-12-23 VITALS — BP 110/80 | HR 71 | Temp 98.0°F | Ht 75.0 in | Wt 250.0 lb

## 2016-12-23 DIAGNOSIS — N400 Enlarged prostate without lower urinary tract symptoms: Secondary | ICD-10-CM | POA: Diagnosis not present

## 2016-12-23 DIAGNOSIS — F331 Major depressive disorder, recurrent, moderate: Secondary | ICD-10-CM

## 2016-12-23 DIAGNOSIS — I208 Other forms of angina pectoris: Secondary | ICD-10-CM | POA: Diagnosis not present

## 2016-12-23 DIAGNOSIS — G4701 Insomnia due to medical condition: Secondary | ICD-10-CM | POA: Insufficient documentation

## 2016-12-23 HISTORY — DX: Insomnia due to medical condition: G47.01

## 2016-12-23 MED ORDER — FINASTERIDE 5 MG PO TABS: 5.0000 mg | ORAL_TABLET | Freq: Every day | ORAL | 0 refills | Status: DC

## 2016-12-23 NOTE — Assessment & Plan Note (Addendum)
Chronic.  Poorly controlled.  Has diagnosis of BPH only on max dose tamulosin. - Given instructions for proper sleep hygiene - See plan for BPH - Will defer sleep agents at this time

## 2016-12-23 NOTE — Assessment & Plan Note (Addendum)
Chronic.  Poorly controlled.  Long standing use of max dose tamsulosin.  No history of finasteride use.  Currently has nephrolithiasis being managed by urology. - Continue tamsulosin 0.8 mg daily, initiating finasteride 5 mg daily - Follow-up urology with next scheduled appointment in February

## 2016-12-23 NOTE — Assessment & Plan Note (Addendum)
Chronic.  Severe based on PHQ 9 without signs of SI/HI.  Currently on max dose of escitalopram .  Followed by [facility].  Symptoms worse since adding bupropion.  Patient is not interested in continuing bupropion.  Suspect symptoms are likely exacerbated by current nephrolithiasis which has not yet passed along with multiple problems, notably uncontrolled BPH and poor sleep. - Discontinue bupropion and continue escitalopram 20 mg daily - Patient to follow Neoma Laming with [facility] services - We will defer changing depression medications at this time

## 2016-12-23 NOTE — Patient Instructions (Signed)
Thank you for coming in to see [place] today. Please see below to review our plan for today's visit.  1.  I have prescribed you a new medication for your nocturia called finasteride.  He will take this daily along with your Flomax.  This medication does take time to work but is very effective in improving BPH symptoms. 2.  Your insomnia is likely due to your frequent need to use the bathroom at night.  Follow the instructions above along with the following sleep hygiene:  Insomnia is a common problem (affects 10% of the population). Sleep hygiene is key to helping restore your sleep. Here are some basic sleep hygiene tips:  No electronic devices or TV in bed  Keep bedroom dark with comfortable cool temperature  No caffeine after 2-4pm  No alcohol within 6 hours of bedtime  No nicotine prior to bedtime  Avoid heavy meal and lots of liquid right before bed  Avoid naps or keep them brief (<30 mins)  Avoid lots of physical activity before sleep  Remember bed is for the 2 S's (sleep and sex)  Make a routine (bath, reading a book, etc.) prior to bed  Set a consistent wake time for all 7 days of the week  You can supplement with mindfulness meditation like deep breathing, stretching, low-intensity yoga, relaxing music  3.  We will discontinue your bupropion and continue your Lexapro at its current dose.  Please call the clinic at [phone] if your symptoms worsen or you have any concerns. It was our pleasure to serve you.  Harriet Butte, [place], PGY-2

## 2016-12-23 NOTE — Progress Notes (Signed)
Type of Service: [place] 2nd F/U Visit Total time:20 minutes :  Interpretor:No.    Reason for follow-up: Continue brief intervention to assist patient with managing symptoms of depression, as well as life stressors . Reports  Extremely difficult  in daily functions. Patient is pleasant and minimally engaged in conversation. States he does not feel well today   Appearance:Well Groomed ; Thought process: Coherent; Affect: Appropriate and Tearful at times. No SI, No plan to harm self or others  PHQ 9=21, indication of severe depression.  PHQ-9 scores have increased since last visit.  Patient reported Bupropion made him feel worse.  PCP discontinued. Patient has implemented relaxed breathing, indicates it does help.  GOALS: Patient will reduce symptoms of: depression and insomnia , and increase  ability TA:VWPVXY skills and self-management skills, Increase healthy adjustment to current life circumstances. Intervention: Behavioral Activation,  Behavioral Therapy (Relaxed breathing), Problem-solving teaching/coping strategies, Psychoeducation and Supportive Counseling   Issues discussed: cycle of depression ; self-care action plan ;  Reading; continue relaxed breathing ; and sleep hygiene  ASSESSMENT:Patient continues to experience symptoms of severe depression which has increased based on PHQ-9 scores and patients feedback.  Patient may benefit from, and is in agreement to continue further assessment and brief therapeutic interventions to assist with managing his symptoms.  PLAN: 1. Patient will F/U with LCSW in two weeks 2. Behavioral recommendations: continue relaxed breathing; #1, 2 & 4 of Self-Care Action Plan (Who Moved My Cheese) and sleep hygiene  3. Referral: none at this time 4. Continue Lexapro as prescribed by PCP  Casimer Lanius, LCSW Licensed Clinical Social Worker [place]   [phone] 11:11 AM

## 2016-12-27 ENCOUNTER — Encounter (HOSPITAL_COMMUNITY): Payer: Self-pay | Admitting: *Deleted

## 2016-12-27 ENCOUNTER — Emergency Department (HOSPITAL_COMMUNITY): Payer: Medicare Other

## 2016-12-27 ENCOUNTER — Emergency Department (HOSPITAL_COMMUNITY)
Admission: EM | Admit: 2016-12-27 | Discharge: 2016-12-27 | Disposition: A | Discharge status: Home / Self Care | Payer: Medicare Other | Attending: Emergency Medicine | Admitting: Emergency Medicine

## 2016-12-27 ENCOUNTER — Other Ambulatory Visit: Payer: Self-pay

## 2016-12-27 DIAGNOSIS — J449 Chronic obstructive pulmonary disease, unspecified: Secondary | ICD-10-CM | POA: Insufficient documentation

## 2016-12-27 DIAGNOSIS — Z79899 Other long term (current) drug therapy: Secondary | ICD-10-CM | POA: Insufficient documentation

## 2016-12-27 DIAGNOSIS — Z87891 Personal history of nicotine dependence: Secondary | ICD-10-CM | POA: Insufficient documentation

## 2016-12-27 DIAGNOSIS — Z96652 Presence of left artificial knee joint: Secondary | ICD-10-CM | POA: Insufficient documentation

## 2016-12-27 DIAGNOSIS — R05 Cough: Secondary | ICD-10-CM | POA: Insufficient documentation

## 2016-12-27 DIAGNOSIS — I10 Essential (primary) hypertension: Secondary | ICD-10-CM | POA: Diagnosis not present

## 2016-12-27 DIAGNOSIS — Z7901 Long term (current) use of anticoagulants: Secondary | ICD-10-CM | POA: Diagnosis not present

## 2016-12-27 DIAGNOSIS — R0602 Shortness of breath: Secondary | ICD-10-CM | POA: Diagnosis not present

## 2016-12-27 DIAGNOSIS — R5383 Other fatigue: Secondary | ICD-10-CM | POA: Diagnosis not present

## 2016-12-27 DIAGNOSIS — J069 Acute upper respiratory infection, unspecified: Secondary | ICD-10-CM | POA: Insufficient documentation

## 2016-12-27 DIAGNOSIS — R6883 Chills (without fever): Secondary | ICD-10-CM | POA: Diagnosis not present

## 2016-12-27 LAB — CBC
HCT: 42.4 % (ref 39.0–52.0)
Hemoglobin: 14.6 g/dL (ref 13.0–17.0)
MCH: 29.9 pg (ref 26.0–34.0)
MCHC: 34.4 g/dL (ref 30.0–36.0)
MCV: 86.7 fL (ref 78.0–100.0)
Platelets: 183 10*3/uL (ref 150–400)
RBC: 4.89 MIL/uL (ref 4.22–5.81)
RDW: 13.1 % (ref 11.5–15.5)
WBC: 11.4 10*3/uL — ABNORMAL HIGH (ref 4.0–10.5)

## 2016-12-27 LAB — BASIC METABOLIC PANEL
Anion gap: 11 (ref 5–15)
BUN: 9 mg/dL (ref 6–20)
CO2: 24 mmol/L (ref 22–32)
Calcium: 9.2 mg/dL (ref 8.9–10.3)
Chloride: 102 mmol/L (ref 101–111)
Creatinine, Ser: 0.87 mg/dL (ref 0.61–1.24)
GFR calc Af Amer: >60 mL/min (ref >60)
GFR calc non Af Amer: >60 mL/min (ref >60)
Glucose, Bld: 129 mg/dL — ABNORMAL HIGH (ref 65–99)
Potassium: 3.2 mmol/L — ABNORMAL LOW (ref 3.5–5.1)
Sodium: 137 mmol/L (ref 135–145)

## 2016-12-27 LAB — I-STAT TROPONIN, ED: Troponin i, poc: 0.01 ng/mL (ref 0.00–0.08)

## 2016-12-27 LAB — PROTIME-INR
INR: 4.06
Prothrombin Time: 39.1 seconds — ABNORMAL HIGH (ref 11.4–15.2)

## 2016-12-27 MED ORDER — PREDNISONE 20 MG PO TABS: 40.0000 mg | ORAL_TABLET | Freq: Every day | ORAL | 0 refills | Status: DC

## 2016-12-27 MED ORDER — AZITHROMYCIN 250 MG PO TABS: 250.0000 mg | ORAL_TABLET | Freq: Every day | ORAL | 0 refills | Status: DC

## 2016-12-27 MED ORDER — PREDNISONE 20 MG PO TABS
60.0000 mg | ORAL_TABLET | Freq: Once | ORAL | Status: AC
  Administered 2016-12-27: 60 mg via ORAL
  Filled 2016-12-27: qty 3

## 2016-12-27 MED ORDER — IPRATROPIUM BROMIDE HFA 17 MCG/ACT IN AERS: 2.0000 | INHALATION_SPRAY | Freq: Four times a day (QID) | RESPIRATORY_TRACT | 0 refills | Status: DC | PRN

## 2016-12-27 MED ORDER — IPRATROPIUM BROMIDE 0.02 % IN SOLN
0.5000 mg | Freq: Once | RESPIRATORY_TRACT | Status: AC
  Administered 2016-12-27: 0.5 mg via RESPIRATORY_TRACT
  Filled 2016-12-27: qty 2.5

## 2016-12-27 NOTE — ED Notes (Signed)
Patient labels using [employer] not working used white labels.

## 2016-12-27 NOTE — ED Notes (Signed)
Patient ambulated steady gait pulse oximetry 94%-95% on room air. Notified provider.

## 2016-12-27 NOTE — ED Provider Notes (Signed)
[place] EMERGENCY DEPARTMENT Provider Note   CSN: [id number] Arrival date & time: 12/27/16  3668     History   Chief Complaint Chief Complaint  Patient presents with  . Atrial Fibrillation  . Cough    HPI Brandon Candela. is a 66 y.o. male.  HPI Patient with shortness of breath and cough.  Initially clear sputum is now become green.  Has had fatigue and chills without a frank fever.  Has a history of both SVT and atrial fibrillation.  Has had brief episodes of A. fib since his ablation a few years ago.  Potentially could have had some arrhythmia the last couple days but states nothing is been prolonged.  Slightly decreased appetite.  States he has heard that he has COPD in the past but has not seen a pulmonologist.  He is on Coumadin for pulmonary embolism and A. fib. Past Medical History:  Diagnosis Date  . Anxiety   . Arthritis   . Benign neoplasm of colon   . Benign prostatic hyperplasia with urinary obstruction   . Bilateral pulmonary embolism ([place]) 3/14   admitted to [facility],  treated with Xarelto  . Cataract   . Chronic pain   . History of kidney stones   . History of pulmonary embolism 03/19/2012  . Hyperlipemia   . Hypertension   . Insomnia   . Major depressive disorder, recurrent episode ([place])   . Mitral regurgitation 04/24/2013   Mild by TEE  . Obesity   . OSA (obstructive sleep apnea)    noncompliant with CPAP.  07/27/13- awaiting a CPAP- unable to tolerate mask  . Osteoporosis   . Persistent atrial fibrillation ([place])   . Restless leg syndrome    takes gabapentin  . Sciatica   . SVT (supraventricular tachycardia) ([place]) 03/07/2016  . Thrombus of left atrial appendage 03/09/2013  . Ventral hernia, unspecified, without mention of obstruction or gangrene    right abdominal wall    Patient Active Problem List   Diagnosis Date Noted  . Insomnia due to medical condition 12/23/2016  . Diverticulosis of colon without hemorrhage 12/02/2016    . BPH (benign prostatic hyperplasia) 10/21/2016  . History of kidney stones 10/21/2016  . Primary hypertension 10/21/2016  . MDD (major depressive disorder) 10/21/2016  . Obesity (BMI 30-39.9) 03/07/2016  . History of alcohol abuse 03/07/2016  . Paroxysmal atrial fibrillation (HCC)   . Restless leg syndrome 02/08/2014  . Current use of long term anticoagulation   . Obstructive sleep apnea 03/09/2013  . Hyperlipidemia 03/21/2012  . History of pulmonary embolism 03/19/2012    Past Surgical History:  Procedure Laterality Date  . CARDIOVERSION N/A 03/21/2012   Procedure: CARDIOVERSION;  Surgeon: Birdie Riddle, MD;  Location: [facility] ENDOSCOPY;  Service: Cardiovascular;  Laterality: N/A;  . CARDIOVERSION N/A 04/24/2013   Procedure: CARDIOVERSION;  Surgeon: Dorothy Spark, MD;  Location: [facility] ENDOSCOPY;  Service: Cardiovascular;  Laterality: N/A;  . CARDIOVERSION N/A 06/27/2015   Procedure: CARDIOVERSION;  Surgeon: Larey Dresser, MD;  Location: [place];  Service: Cardiovascular;  Laterality: N/A;  . CATARACT EXTRACTION    . COLONOSCOPY W/ POLYPECTOMY    . ELECTROPHYSIOLOGIC STUDY N/A 07/30/2015   Procedure: Atrial Fibrillation Ablation;  Surgeon: Thompson Grayer, MD;  Location: [place] CV LAB;  Service: Cardiovascular;  Laterality: N/A;  . EXTRACORPOREAL SHOCK WAVE LITHOTRIPSY Right 10/29/2016   Procedure: RIGHT EXTRACORPOREAL SHOCK WAVE LITHOTRIPSY (ESWL);  Surgeon: Alexis Frock, MD;  Location: [facility] ORS;  Service: Urology;  Laterality: Right;  . EYE SURGERY Right    cataract  . FEMUR FRACTURE SURGERY    . HERNIA REPAIR  07/06/2011  . INGUINAL HERNIA REPAIR Right 07/28/2013   Procedure: RIGHT INGUINAL HERNIA REPAIR;  Surgeon: Imogene Burn. Georgette Dover, MD;  Location: [place];  Service: General;  Laterality: Right;  . INGUINAL HERNIA REPAIR Right 09/22/2016   Procedure: LAPAROSCOPIC RIGHT INGUINAL HERNIA;  Surgeon: Kinsinger, Arta Bruce, MD;  Location: [facility] ORS;  Service: General;  Laterality: Right;   With MESH  . INSERTION OF MESH Right 07/28/2013   Procedure: INSERTION OF MESH;  Surgeon: Imogene Burn. Georgette Dover, MD;  Location: [place];  Service: General;  Laterality: Right;  . KNEE ARTHROSCOPY     left  . REPLACEMENT TOTAL KNEE Left   . ROTATOR CUFF REPAIR     right  . ROTATOR CUFF REPAIR Left 03/08/2014   DR SUPPLE  . SHOULDER ARTHROSCOPY WITH ROTATOR CUFF REPAIR AND SUBACROMIAL DECOMPRESSION Left 03/08/2014   Procedure: LEFT SHOULDER ARTHROSCOPY WITH ROTATOR CUFF REPAIR/SUBACROMIAL DECOMPRESSION/DISTAL CLAVICLE RESECTION;  Surgeon: Marin Shutter, MD;  Location: [place];  Service: Orthopedics;  Laterality: Left;  . TEE WITHOUT CARDIOVERSION N/A 03/21/2012   Procedure: TRANSESOPHAGEAL ECHOCARDIOGRAM (TEE);  Surgeon: Birdie Riddle, MD;  Location: [place];  Service: Cardiovascular;  Laterality: N/A;  . TEE WITHOUT CARDIOVERSION N/A 04/24/2013   Procedure: TRANSESOPHAGEAL ECHOCARDIOGRAM (TEE);  Surgeon: Dorothy Spark, MD;  Location: [place];  Service: Cardiovascular;  Laterality: N/A;  . TEE WITHOUT CARDIOVERSION N/A 07/29/2015   Procedure: TRANSESOPHAGEAL ECHOCARDIOGRAM (TEE);  Surgeon: Fay Records, MD;  Location: [facility] ENDOSCOPY;  Service: Cardiovascular;  Laterality: N/A;  . TONSILLECTOMY         Home Medications    Prior to Admission medications   Medication Sig Start Date End Date Taking? Authorizing Provider  acetaminophen (TYLENOL) 325 MG tablet You can take 2 tablets every 4 hours as need and alternate with the prescribed pain medicine, which also has Tylenol.    DO NOT TAKE MORE THAN 4000 MG OF TYLENOL PER DAY.  IT CAN HARM YOUR LIVER.  TYLENOL (ACETAMINOPHEN) IS ALSO IN YOUR PRESCRIPTION PAIN MEDICATION.  YOU HAVE TO COUNT IT IN YOUR DAILY TOTAL. 09/23/16   Earnstine Regal, PA-C  amLODipine (NORVASC) 5 MG tablet Take 1 tablet (5 mg total) by mouth daily. 12/02/16   [place] Bing, DO  atorvastatin (LIPITOR) 40 MG tablet Take 1 tablet (40 mg total) by mouth daily. 11/04/16    [place] Bing, DO  azithromycin (ZITHROMAX) 250 MG tablet Take 1 tablet (250 mg total) by mouth daily. Take first 2 tablets together, then 1 every day until finished. 12/27/16   Davonna Belling, MD  buPROPion (WELLBUTRIN) 75 MG tablet Take 1 tablet (75 mg total) by mouth 3 (three) times daily. 12/08/16 12/08/17  Guadalupe Dawn, MD  carvedilol (COREG) 25 MG tablet Take 1 tablet (25 mg total) by mouth 2 (two) times daily with a meal. 12/02/16   [place] Bing, DO  escitalopram (LEXAPRO) 20 MG tablet Take 1 tablet (20 mg total) by mouth daily. 10/21/16   [place] Bing, DO  finasteride (PROSCAR) 5 MG tablet Take 1 tablet (5 mg total) by mouth daily. 12/23/16   [place] Bing, DO  gabapentin (NEURONTIN) 800 MG tablet Take 1 tablet (800 mg total) by mouth 2 (two) times daily. 11/04/16   [place] Bing, DO  ibuprofen (ADVIL,MOTRIN) 600 MG tablet Take 1 tablet (600 mg total)  by mouth every 6 (six) hours as needed for mild pain or moderate pain. 10/30/16   [place]de Dandy, MD  ipratropium (ATROVENT HFA) 17 MCG/ACT inhaler Inhale 2 puffs into the lungs every 6 (six) hours as needed for wheezing. 12/27/16   Davonna Belling, MD  losartan (COZAAR) 100 MG tablet Take 1 tablet (100 mg total) by mouth daily. 10/26/16   Allred, Jeneen Rinks, MD  ondansetron (ZOFRAN) 4 MG tablet Take 1 tablet (4 mg total) by mouth every 6 (six) hours as needed for nausea or vomiting. Patient not taking: Reported on 12/23/2016 10/30/16   Forde Dandy, MD  oxyCODONE-acetaminophen (PERCOCET/ROXICET) 5-325 MG tablet Take 1 tablet by mouth every 6 (six) hours as needed for moderate pain or severe pain. Patient not taking: Reported on 12/23/2016 10/30/16   Forde Dandy, MD  predniSONE (DELTASONE) 20 MG tablet Take 2 tablets (40 mg total) by mouth daily. 12/28/16   Davonna Belling, MD  spironolactone (ALDACTONE) 25 MG tablet Take 1 tablet (25 mg total) by mouth daily. 10/26/16   Allred, Jeneen Rinks, MD  tamsulosin (FLOMAX) 0.4 MG  CAPS capsule Take 1 capsule (0.4 mg total) by mouth 2 (two) times daily. 12/02/16   [place] Bing, DO  warfarin (COUMADIN) 5 MG tablet Take 5 mg by mouth daily. 10/12/16   [provider]    Family History Family History  Problem Relation Age of Onset  . Cancer Father        bone  . Heart failure Mother   . Hypertension Mother   . Asthma Mother   . Cancer Paternal Grandmother        ovarian  . CVA Other        Fam Hx of multiple myeloma  . Diabetes Other        Fam Hx of DM    Social History Social History   Tobacco Use  . Smoking status: Former Smoker    Packs/day: 0.50    Years: 5.00    Pack years: 2.50    Types: Cigarettes  . Smokeless tobacco: Never Used  Substance Use Topics  . Alcohol use: No    Comment: Former EtOH abuse, stopped 06/2016  . Drug use: No    Comment: negative hx for IV drug abuse     Allergies   Ace inhibitors and Albuterol   Review of Systems Review of Systems  Constitutional: Positive for chills. Negative for appetite change.  HENT: Negative for congestion.   Respiratory: Positive for cough, shortness of breath and wheezing.   Cardiovascular: Positive for chest pain.  Gastrointestinal: Negative for abdominal pain.  Genitourinary: Negative for frequency.  Musculoskeletal: Negative for back pain.  Neurological: Negative for tremors and seizures.  Psychiatric/Behavioral: Negative for confusion.     Physical Exam Updated Vital Signs BP (!) 151/103   Pulse 86   Temp 98.3 F (36.8 C) (Oral)   Resp 18   SpO2 95%   Physical Exam  Constitutional: He appears well-developed.  HENT:  Head: Normocephalic.  Eyes: Pupils are equal, round, and reactive to light.  Neck: Neck supple.  Cardiovascular: Normal rate.  Pulmonary/Chest:  Diffuse wheezes and prolonged expirations.  Abdominal: Soft. There is no tenderness.  Musculoskeletal: He exhibits no edema.  Neurological: He is alert.  Skin: Skin is warm. Capillary refill  takes less than 2 seconds.  Psychiatric: He has a normal mood and affect.     ED Treatments / Results  Labs (all labs ordered are listed, but only  abnormal results are displayed) Labs Reviewed  BASIC METABOLIC PANEL - Abnormal; Notable for the following components:      Result Value   Potassium 3.2 (*)    Glucose, Bld 129 (*)    All other components within normal limits  CBC - Abnormal; Notable for the following components:   WBC 11.4 (*)    All other components within normal limits  PROTIME-INR - Abnormal; Notable for the following components:   Prothrombin Time 39.1 (*)    INR 4.06 (*)    All other components within normal limits  I-STAT TROPONIN, ED    EKG  EKG Interpretation  Date/Time:  Sunday December 27 2016 09:06:22 EST Ventricular Rate:  94 PR Interval:  198 QRS Duration: 106 QT Interval:  380 QTC Calculation: 475 R Axis:   -20 Text Interpretation:  Sinus rhythm with occasional Premature ventricular complexes Otherwise normal ECG Confirmed by Linder Prajapati ([id number]) on 12/27/2016 9:15:24 AM       Radiology Dg Chest 2 View  Result Date: 12/27/2016 CLINICAL DATA:  66 year old current history of atrial fibrillation, presenting with productive cough, shortness of breath, fever and sweats over the past several days. EXAM: CHEST  2 VIEW COMPARISON:  10/30/2016, 10/23/2015 and earlier, including CTA chest 05/03/2013 and earlier. FINDINGS: AP erect and lateral images were obtained. Cardiac silhouette upper normal in size for the AP technique, cardiac silhouette mildly enlarged. Thoracic aorta tortuous and mildly atherosclerotic, unchanged. Prominent central pulmonary arteries, unchanged. Moderate to marked central peribronchial thickening, more so than on prior examinations. Lungs otherwise clear. No localized airspace consolidation. No pleural effusions. No pneumothorax. Normal pulmonary vascularity. Degenerative changes involving the thoracic spine. IMPRESSION:  Moderate changes of acute bronchitis and/or asthma without focal airspace pneumonia. Electronically Signed   By: Thomas  Lawrence M.D.   On: 12/27/2016 09:43    Procedures Procedures (including critical care time)  Medications Ordered in ED Medications  ipratropium (ATROVENT) nebulizer solution 0.5 mg (0.5 mg Nebulization Given 12/27/16 0958)  predniSONE (DELTASONE) tablet 60 mg (60 mg Oral Given 12/27/16 1002)     Initial Impression / Assessment and Plan / ED Course  I have reviewed the triage vital signs and the nursing notes.  Pertinent labs & imaging results that were available during my care of the patient were reviewed by me and considered in my medical decision making (see chart for details).     Patient with cough and URI symptoms.  Bronchitis on x-ray without focal pneumonia with patient's history of COPD willing to empirically treat with antibiotics and steroids.  History of issues with albuterol but has tolerated Atrovent and will be discharged with an inhaler.  Final Clinical Impressions(s) / ED Diagnoses   Final diagnoses:  Upper respiratory tract infection, unspecified type    ED Discharge Orders        Ordered    predniSONE (DELTASONE) 20 MG tablet  Daily     12/27/16 1137    ipratropium (ATROVENT HFA) 17 MCG/ACT inhaler  Every 6 hours PRN     12/27/16 1124    azithromycin (ZITHROMAX) 250 MG tablet  Daily     12 /23/18 1137       Davonna Belling, MD 12/27/16 1645

## 2016-12-27 NOTE — ED Triage Notes (Signed)
Pt reports hx of atrial fib. Having productive cough, fever/sweats and sob for several days, reports HR elevated pta.

## 2017-01-11 ENCOUNTER — Ambulatory Visit (INDEPENDENT_AMBULATORY_CARE_PROVIDER_SITE_OTHER): Payer: Medicare Other | Admitting: *Deleted

## 2017-01-11 DIAGNOSIS — I480 Paroxysmal atrial fibrillation: Secondary | ICD-10-CM | POA: Diagnosis not present

## 2017-01-11 DIAGNOSIS — Z5181 Encounter for therapeutic drug level monitoring: Secondary | ICD-10-CM | POA: Diagnosis not present

## 2017-01-11 LAB — POCT INR: INR: 3.1

## 2017-01-11 NOTE — Patient Instructions (Signed)
Description   Skip today's dose, then Continue same dose of coumadin 1 tablet daily except 1.5 tablets on Thursdays . Recheck INR in 2 weeks  Call with any questions or changes .  Coumadin Clinic [phone]

## 2017-01-18 ENCOUNTER — Other Ambulatory Visit: Payer: Self-pay | Admitting: *Deleted

## 2017-01-19 MED ORDER — IPRATROPIUM BROMIDE HFA 17 MCG/ACT IN AERS: 2.0000 | INHALATION_SPRAY | Freq: Four times a day (QID) | RESPIRATORY_TRACT | 0 refills | Status: DC | PRN

## 2017-01-19 NOTE — Telephone Encounter (Signed)
Rx was printed, sent in electronically. Fleeger, Salome Spotted, CMA

## 2017-01-19 NOTE — Addendum Note (Signed)
Addended by: Christen Bame D on: 01/19/2017 05:11 PM   Modules accepted: Orders

## 2017-01-25 ENCOUNTER — Ambulatory Visit (INDEPENDENT_AMBULATORY_CARE_PROVIDER_SITE_OTHER): Payer: Medicare Other | Admitting: Pharmacist

## 2017-01-25 DIAGNOSIS — I480 Paroxysmal atrial fibrillation: Secondary | ICD-10-CM

## 2017-01-25 DIAGNOSIS — Z5181 Encounter for therapeutic drug level monitoring: Secondary | ICD-10-CM

## 2017-01-25 LAB — POCT INR: INR: 2.9

## 2017-01-25 NOTE — Patient Instructions (Signed)
Continue same dose of coumadin 1 tablet daily except 1.5 tablets on Thursdays . Recheck INR in 4 weeks  Call with any questions or changes .  Coumadin Clinic [phone]

## 2017-02-04 ENCOUNTER — Encounter: Payer: Self-pay | Admitting: Family Medicine

## 2017-02-04 ENCOUNTER — Other Ambulatory Visit: Payer: Self-pay | Admitting: Internal Medicine

## 2017-02-18 ENCOUNTER — Encounter: Payer: Self-pay | Admitting: Internal Medicine

## 2017-02-18 ENCOUNTER — Ambulatory Visit (INDEPENDENT_AMBULATORY_CARE_PROVIDER_SITE_OTHER): Payer: Medicare Other | Admitting: Internal Medicine

## 2017-02-18 VITALS — BP 118/80 | HR 64 | Ht 75.0 in | Wt 258.6 lb

## 2017-02-18 DIAGNOSIS — I10 Essential (primary) hypertension: Secondary | ICD-10-CM

## 2017-02-18 DIAGNOSIS — I471 Supraventricular tachycardia, unspecified: Secondary | ICD-10-CM

## 2017-02-18 DIAGNOSIS — I480 Paroxysmal atrial fibrillation: Secondary | ICD-10-CM

## 2017-02-18 DIAGNOSIS — G4730 Sleep apnea, unspecified: Secondary | ICD-10-CM | POA: Diagnosis not present

## 2017-02-18 NOTE — Progress Notes (Signed)
PCP: Delta Bing, DO   Primary EP: Dr Larose Kells. is a 67 y.o. male who presents today for routine electrophysiology followup.  Since last being seen in our clinic, the patient reports doing very well.  + snores and has difficulty sleeping.  Cannot wear CPAP.  No afib since last visit. Today, he denies symptoms of palpitations, chest pain, shortness of breath,  lower extremity edema, dizziness, presyncope, or syncope.  The patient is otherwise without complaint today.   Past Medical History:  Diagnosis Date  . Anxiety   . Arthritis   . Benign neoplasm of colon   . Benign prostatic hyperplasia with urinary obstruction   . Bilateral pulmonary embolism ([place]) 3/14   admitted to [facility],  treated with Xarelto  . Cataract   . Chronic pain   . History of kidney stones   . History of pulmonary embolism 03/19/2012  . Hyperlipemia   . Hypertension   . Insomnia   . Major depressive disorder, recurrent episode ([place])   . Mitral regurgitation 04/24/2013   Mild by TEE  . Obesity   . OSA (obstructive sleep apnea)    noncompliant with CPAP.  07/27/13- awaiting a CPAP- unable to tolerate mask  . Osteoporosis   . Persistent atrial fibrillation ([place])   . Restless leg syndrome    takes gabapentin  . Sciatica   . SVT (supraventricular tachycardia) ([place]) 03/07/2016  . Thrombus of left atrial appendage 03/09/2013  . Ventral hernia, unspecified, without mention of obstruction or gangrene    right abdominal wall   Past Surgical History:  Procedure Laterality Date  . CARDIOVERSION N/A 03/21/2012   Procedure: CARDIOVERSION;  Surgeon: Birdie Riddle, MD;  Location: [facility] ENDOSCOPY;  Service: Cardiovascular;  Laterality: N/A;  . CARDIOVERSION N/A 04/24/2013   Procedure: CARDIOVERSION;  Surgeon: Dorothy Spark, MD;  Location: [facility] ENDOSCOPY;  Service: Cardiovascular;  Laterality: N/A;  . CARDIOVERSION N/A 06/27/2015   Procedure: CARDIOVERSION;  Surgeon: Larey Dresser, MD;  Location: [place];  Service: Cardiovascular;  Laterality: N/A;  . CATARACT EXTRACTION    . COLONOSCOPY W/ POLYPECTOMY    . ELECTROPHYSIOLOGIC STUDY N/A 07/30/2015   Procedure: Atrial Fibrillation Ablation;  Surgeon: Thompson Grayer, MD;  Location: [place] CV LAB;  Service: Cardiovascular;  Laterality: N/A;  . EXTRACORPOREAL SHOCK WAVE LITHOTRIPSY Right 10/29/2016   Procedure: RIGHT EXTRACORPOREAL SHOCK WAVE LITHOTRIPSY (ESWL);  Surgeon: Alexis Frock, MD;  [facility];  Service: Urology;  Laterality: Right;  . EYE SURGERY Right    cataract  . FEMUR FRACTURE SURGERY    . HERNIA REPAIR  07/06/2011  . INGUINAL HERNIA REPAIR Right 07/28/2013   Procedure: RIGHT INGUINAL HERNIA REPAIR;  Surgeon: Imogene Burn. Georgette Dover, MD;  Location: [place];  Service: General;  Laterality: Right;  . INGUINAL HERNIA REPAIR Right 09/22/2016   Procedure: LAPAROSCOPIC RIGHT INGUINAL HERNIA;  Surgeon: Kinsinger, Arta Bruce, MD;  Location: [facility];  Service: General;  Laterality: Right;  With MESH  . INSERTION OF MESH Right 07/28/2013   Procedure: INSERTION OF MESH;  Surgeon: Imogene Burn. Georgette Dover, MD;  Location: [place];  Service: General;  Laterality: Right;  . KNEE ARTHROSCOPY     left  . REPLACEMENT TOTAL KNEE Left   . ROTATOR CUFF REPAIR     right  . ROTATOR CUFF REPAIR Left 03/08/2014   DR SUPPLE  . SHOULDER ARTHROSCOPY WITH ROTATOR CUFF REPAIR AND SUBACROMIAL DECOMPRESSION Left 03/08/2014   Procedure: LEFT SHOULDER  ARTHROSCOPY WITH ROTATOR CUFF REPAIR/SUBACROMIAL DECOMPRESSION/DISTAL CLAVICLE RESECTION;  Surgeon: Marin Shutter, MD;  Location: [place];  Service: Orthopedics;  Laterality: Left;  . TEE WITHOUT CARDIOVERSION N/A 03/21/2012   Procedure: TRANSESOPHAGEAL ECHOCARDIOGRAM (TEE);  Surgeon: Birdie Riddle, MD;  Location: [place];  Service: Cardiovascular;  Laterality: N/A;  . TEE WITHOUT CARDIOVERSION N/A 04/24/2013   Procedure: TRANSESOPHAGEAL ECHOCARDIOGRAM (TEE);  Surgeon: Dorothy Spark, MD;  Location: [place];   Service: Cardiovascular;  Laterality: N/A;  . TEE WITHOUT CARDIOVERSION N/A 07/29/2015   Procedure: TRANSESOPHAGEAL ECHOCARDIOGRAM (TEE);  Surgeon: Fay Records, MD;  Location: [facility] ENDOSCOPY;  Service: Cardiovascular;  Laterality: N/A;  . TONSILLECTOMY      ROS- all systems are reviewed and negatives except as per HPI above  Current Outpatient Medications  Medication Sig Dispense Refill  . acetaminophen (TYLENOL) 325 MG tablet You can take 2 tablets every 4 hours as need and alternate with the prescribed pain medicine, which also has Tylenol.    DO NOT TAKE MORE THAN 4000 MG OF TYLENOL PER DAY.  IT CAN HARM YOUR LIVER.  TYLENOL (ACETAMINOPHEN) IS ALSO IN YOUR PRESCRIPTION PAIN MEDICATION.  YOU HAVE TO COUNT IT IN YOUR DAILY TOTAL.    [place]land Kitchen amLODipine (NORVASC) 5 MG tablet Take 1 tablet (5 mg total) by mouth daily. 90 tablet 3  . atorvastatin (LIPITOR) 40 MG tablet Take 1 tablet (40 mg total) by mouth daily. 90 tablet 3  . carvedilol (COREG) 25 MG tablet Take 1 tablet (25 mg total) by mouth 2 (two) times daily with a meal. 180 tablet 3  . escitalopram (LEXAPRO) 20 MG tablet Take 1 tablet (20 mg total) by mouth daily. 60 tablet 2  . finasteride (PROSCAR) 5 MG tablet Take 1 tablet (5 mg total) by mouth daily. 90 tablet 0  . gabapentin (NEURONTIN) 800 MG tablet Take 1 tablet (800 mg total) by mouth 2 (two) times daily. 180 tablet 3  . ipratropium (ATROVENT HFA) 17 MCG/ACT inhaler Inhale 2 puffs into the lungs every 6 (six) hours as needed for wheezing. 1 Inhaler 0  . losartan (COZAAR) 100 MG tablet Take 1 tablet (100 mg total) by mouth daily. 90 tablet 3  . spironolactone (ALDACTONE) 25 MG tablet Take 1 tablet (25 mg total) by mouth daily. 90 tablet 3  . tamsulosin (FLOMAX) 0.4 MG CAPS capsule Take 1 capsule (0.4 mg total) by mouth 2 (two) times daily. 180 capsule 3  . warfarin (COUMADIN) 5 MG tablet TAKE 1 TABLET AS DIRECTED BY COUMADIN CLINIC 40 tablet 3   No current facility-administered  medications for this visit.     Physical Exam: Vitals:   02/18/17 0959  BP: 118/80  Pulse: 64  Weight: 258 lb 9.6 oz (117.3 kg)  Height: 6\' 3"  (1.905 m)    GEN- The patient is well appearing, alert and oriented x 3 today.   Head- normocephalic, atraumatic Eyes-  Sclera clear, conjunctiva pink Ears- hearing intact Oropharynx- clear Lungs- Clear to ausculation bilaterally, normal work of breathing Heart- Regular rate and rhythm, no murmurs, rubs or gallops, PMI not laterally displaced GI- soft, NT, ND, + BS Extremities- no clubbing, cyanosis, or edema  EKG tracing 12/28/16 is personally reviewed and reveals sinus rhythm with PVCs  Assessment and Plan:  1. Persistent afib Well controlled post ablation off AAD therapy Would continue lifestyle anticoagulation given prior LAA thrombus  2. SVT Stable, adenosine sensitive No change required today  3. HTN Stable No change required today  4. Hypokalemia Improved with spironolactone  Low 12/27/16 (reviewed today) I have advised that he have his PCP evaluate for secondary causes  5. ETOH He quit 6/18  6. OSA Not compliant with CPAP I will refer to Dr Ron Parker as he would like to consider alternatives to CPAP for OSA management  Return to see me in 6 months  Thompson Grayer MD, [facility] 02/18/2017 10:35 AM

## 2017-02-18 NOTE — Patient Instructions (Addendum)
Medication Instructions:  Your physician recommends that you continue on your current medications as directed. Please refer to the Current Medication list given to you today.  Labwork: None ordered.  Testing/Procedures: None ordered.  Follow-Up: Your physician wants you to follow-up in: 6 months with Dr. Rayann Heman.   You will receive a reminder letter in the mail two months in advance. If you don't receive a letter, please call our office to schedule the follow-up appointment.  Any Other Special Instructions Will Be Listed Below (If Applicable).  We are sending a referral to Dr. Ron Parker for evaluation for sleep apnea.  If you need a refill on your cardiac medications before your next appointment, please call your pharmacy.

## 2017-02-21 ENCOUNTER — Other Ambulatory Visit: Payer: Self-pay | Admitting: Family Medicine

## 2017-02-22 ENCOUNTER — Ambulatory Visit (INDEPENDENT_AMBULATORY_CARE_PROVIDER_SITE_OTHER): Payer: Medicare Other | Admitting: Pharmacist

## 2017-02-22 DIAGNOSIS — I480 Paroxysmal atrial fibrillation: Secondary | ICD-10-CM | POA: Diagnosis not present

## 2017-02-22 DIAGNOSIS — Z5181 Encounter for therapeutic drug level monitoring: Secondary | ICD-10-CM

## 2017-02-22 DIAGNOSIS — I749 Embolism and thrombosis of unspecified artery: Secondary | ICD-10-CM | POA: Diagnosis not present

## 2017-02-22 LAB — POCT INR: INR: 3.6

## 2017-02-22 NOTE — Patient Instructions (Signed)
Description   Skip your Coumadin today, then continue same dose of coumadin 1 tablet daily except 1.5 tablets on Thursdays . Recheck INR in 3 weeks  Call with any questions or changes.  Coumadin Clinic [phone]

## 2017-02-26 ENCOUNTER — Encounter: Payer: Self-pay | Admitting: Family Medicine

## 2017-03-01 DIAGNOSIS — N202 Calculus of kidney with calculus of ureter: Secondary | ICD-10-CM | POA: Diagnosis not present

## 2017-03-02 ENCOUNTER — Telehealth: Payer: Self-pay | Admitting: Neurology

## 2017-03-02 NOTE — Telephone Encounter (Signed)
Dr. Ron Parker office is needing the pts sleep study notes and results as soon as possible fax # [phone] any questions contact at [phone]

## 2017-03-16 ENCOUNTER — Telehealth: Payer: Self-pay | Admitting: Interventional Cardiology

## 2017-03-16 ENCOUNTER — Ambulatory Visit (INDEPENDENT_AMBULATORY_CARE_PROVIDER_SITE_OTHER): Payer: Medicare Other | Admitting: *Deleted

## 2017-03-16 DIAGNOSIS — I749 Embolism and thrombosis of unspecified artery: Secondary | ICD-10-CM

## 2017-03-16 DIAGNOSIS — Z5181 Encounter for therapeutic drug level monitoring: Secondary | ICD-10-CM | POA: Diagnosis not present

## 2017-03-16 DIAGNOSIS — I480 Paroxysmal atrial fibrillation: Secondary | ICD-10-CM

## 2017-03-16 LAB — POCT INR: INR: 4.6

## 2017-03-16 NOTE — Patient Instructions (Addendum)
Description   Skip your Coumadin today and tomorrow, then start taking coumadin 1 tablet daily. Recheck INR in 2 weeks  Call with any questions or changes.  Coumadin Clinic [phone]

## 2017-03-20 ENCOUNTER — Other Ambulatory Visit: Payer: Self-pay | Admitting: Family Medicine

## 2017-03-20 DIAGNOSIS — N400 Enlarged prostate without lower urinary tract symptoms: Secondary | ICD-10-CM

## 2017-03-24 ENCOUNTER — Other Ambulatory Visit: Payer: Self-pay | Admitting: Family Medicine

## 2017-03-24 DIAGNOSIS — N400 Enlarged prostate without lower urinary tract symptoms: Secondary | ICD-10-CM

## 2017-03-30 ENCOUNTER — Ambulatory Visit (INDEPENDENT_AMBULATORY_CARE_PROVIDER_SITE_OTHER): Payer: Medicare Other | Admitting: *Deleted

## 2017-03-30 DIAGNOSIS — I480 Paroxysmal atrial fibrillation: Secondary | ICD-10-CM | POA: Diagnosis not present

## 2017-03-30 DIAGNOSIS — Z5181 Encounter for therapeutic drug level monitoring: Secondary | ICD-10-CM | POA: Diagnosis not present

## 2017-03-30 DIAGNOSIS — I749 Embolism and thrombosis of unspecified artery: Secondary | ICD-10-CM

## 2017-03-30 LAB — POCT INR: INR: 3.6

## 2017-03-30 NOTE — Patient Instructions (Addendum)
Description   Skip your Coumadin today, then start taking Coumadin 1 tablet daily (this is the dose you should have been taking). Recheck INR in 2 weeks.  Call with any questions or changes.  Coumadin Clinic [phone]

## 2017-04-10 ENCOUNTER — Other Ambulatory Visit: Payer: Self-pay | Admitting: Internal Medicine

## 2017-04-13 ENCOUNTER — Ambulatory Visit (INDEPENDENT_AMBULATORY_CARE_PROVIDER_SITE_OTHER): Payer: Medicare Other | Admitting: *Deleted

## 2017-04-13 DIAGNOSIS — Z86711 Personal history of pulmonary embolism: Secondary | ICD-10-CM

## 2017-04-13 DIAGNOSIS — Z5181 Encounter for therapeutic drug level monitoring: Secondary | ICD-10-CM | POA: Diagnosis not present

## 2017-04-13 DIAGNOSIS — I480 Paroxysmal atrial fibrillation: Secondary | ICD-10-CM

## 2017-04-13 DIAGNOSIS — I749 Embolism and thrombosis of unspecified artery: Secondary | ICD-10-CM | POA: Diagnosis not present

## 2017-04-13 LAB — POCT INR: INR: 3.6

## 2017-04-13 NOTE — Patient Instructions (Signed)
Description   Do not take coumadin today April 9th then change coumadin dose to 1 tablet daily except 1/2 tablet on Saturdays . Recheck INR in 2 weeks.  Call with any questions or changes.  Coumadin Clinic [phone]

## 2017-04-19 ENCOUNTER — Other Ambulatory Visit: Payer: Self-pay | Admitting: Family Medicine

## 2017-04-19 DIAGNOSIS — F331 Major depressive disorder, recurrent, moderate: Secondary | ICD-10-CM

## 2017-04-27 ENCOUNTER — Ambulatory Visit (INDEPENDENT_AMBULATORY_CARE_PROVIDER_SITE_OTHER): Payer: Medicare Other | Admitting: *Deleted

## 2017-04-27 DIAGNOSIS — I480 Paroxysmal atrial fibrillation: Secondary | ICD-10-CM | POA: Diagnosis not present

## 2017-04-27 DIAGNOSIS — Z5181 Encounter for therapeutic drug level monitoring: Secondary | ICD-10-CM

## 2017-04-27 DIAGNOSIS — I749 Embolism and thrombosis of unspecified artery: Secondary | ICD-10-CM | POA: Diagnosis not present

## 2017-04-27 LAB — POCT INR: INR: 2.9

## 2017-04-27 NOTE — Patient Instructions (Signed)
Description   Pt states he has been taking 1 tablet (5mg ) daily so this dose continued . Recheck INR in 3 weeks.  Call with any questions or changes.  Coumadin Clinic [phone]

## 2017-05-07 NOTE — Telephone Encounter (Signed)
Error

## 2017-05-15 ENCOUNTER — Emergency Department (HOSPITAL_COMMUNITY): Payer: Medicare Other

## 2017-05-15 ENCOUNTER — Encounter (HOSPITAL_COMMUNITY): Payer: Self-pay

## 2017-05-15 ENCOUNTER — Emergency Department (HOSPITAL_COMMUNITY)
Admission: EM | Admit: 2017-05-15 | Discharge: 2017-05-15 | Disposition: A | Discharge status: Home / Self Care | Payer: Medicare Other | Attending: Physician Assistant | Admitting: Physician Assistant

## 2017-05-15 ENCOUNTER — Other Ambulatory Visit: Payer: Self-pay

## 2017-05-15 DIAGNOSIS — N200 Calculus of kidney: Secondary | ICD-10-CM | POA: Insufficient documentation

## 2017-05-15 DIAGNOSIS — Z96652 Presence of left artificial knee joint: Secondary | ICD-10-CM | POA: Insufficient documentation

## 2017-05-15 DIAGNOSIS — Z79899 Other long term (current) drug therapy: Secondary | ICD-10-CM | POA: Diagnosis not present

## 2017-05-15 DIAGNOSIS — Z87891 Personal history of nicotine dependence: Secondary | ICD-10-CM | POA: Diagnosis not present

## 2017-05-15 DIAGNOSIS — I10 Essential (primary) hypertension: Secondary | ICD-10-CM | POA: Insufficient documentation

## 2017-05-15 DIAGNOSIS — Z7901 Long term (current) use of anticoagulants: Secondary | ICD-10-CM | POA: Diagnosis not present

## 2017-05-15 DIAGNOSIS — R1110 Vomiting, unspecified: Secondary | ICD-10-CM | POA: Insufficient documentation

## 2017-05-15 DIAGNOSIS — R109 Unspecified abdominal pain: Secondary | ICD-10-CM | POA: Diagnosis not present

## 2017-05-15 DIAGNOSIS — R102 Pelvic and perineal pain: Secondary | ICD-10-CM | POA: Diagnosis present

## 2017-05-15 LAB — URINALYSIS, ROUTINE W REFLEX MICROSCOPIC
Bacteria, UA: NONE SEEN
Bilirubin Urine: NEGATIVE
Glucose, UA: NEGATIVE mg/dL
Ketones, ur: NEGATIVE mg/dL
Leukocytes, UA: NEGATIVE
Nitrite: NEGATIVE
Protein, ur: 100 mg/dL — AB
Specific Gravity, Urine: 1.011 (ref 1.005–1.030)
pH: 8 (ref 5.0–8.0)

## 2017-05-15 LAB — COMPREHENSIVE METABOLIC PANEL
ALT: 17 U/L (ref 17–63)
AST: 21 U/L (ref 15–41)
Albumin: 4.3 g/dL (ref 3.5–5.0)
Alkaline Phosphatase: 79 U/L (ref 38–126)
Anion gap: 10 (ref 5–15)
BUN: 6 mg/dL (ref 6–20)
CO2: 26 mmol/L (ref 22–32)
Calcium: 9.5 mg/dL (ref 8.9–10.3)
Chloride: 101 mmol/L (ref 101–111)
Creatinine, Ser: 0.76 mg/dL (ref 0.61–1.24)
GFR calc Af Amer: >60 mL/min (ref >60)
GFR calc non Af Amer: >60 mL/min (ref >60)
Glucose, Bld: 133 mg/dL — ABNORMAL HIGH (ref 65–99)
Potassium: 3.3 mmol/L — ABNORMAL LOW (ref 3.5–5.1)
Sodium: 137 mmol/L (ref 135–145)
Total Bilirubin: 1.1 mg/dL (ref 0.3–1.2)
Total Protein: 7.6 g/dL (ref 6.5–8.1)

## 2017-05-15 LAB — CBC
HCT: 43.5 % (ref 39.0–52.0)
Hemoglobin: 15 g/dL (ref 13.0–17.0)
MCH: 30.3 pg (ref 26.0–34.0)
MCHC: 34.5 g/dL (ref 30.0–36.0)
MCV: 87.9 fL (ref 78.0–100.0)
Platelets: 172 10*3/uL (ref 150–400)
RBC: 4.95 MIL/uL (ref 4.22–5.81)
RDW: 13.1 % (ref 11.5–15.5)
WBC: 9.1 10*3/uL (ref 4.0–10.5)

## 2017-05-15 LAB — LIPASE, BLOOD: Lipase: 30 U/L (ref 11–51)

## 2017-05-15 MED ORDER — OXYCODONE-ACETAMINOPHEN 5-325 MG PO TABS
2.0000 | ORAL_TABLET | ORAL | Status: DC | PRN
  Administered 2017-05-15: 2 via ORAL
  Filled 2017-05-15: qty 2

## 2017-05-15 MED ORDER — MORPHINE SULFATE (PF) 4 MG/ML IV SOLN
4.0000 mg | Freq: Once | INTRAVENOUS | Status: AC
  Administered 2017-05-15: 4 mg via INTRAVENOUS
  Filled 2017-05-15: qty 1

## 2017-05-15 MED ORDER — SODIUM CHLORIDE 0.9 % IV BOLUS
1000.0000 mL | Freq: Once | INTRAVENOUS | Status: AC
Start: 2017-05-15 — End: 2017-05-15
  Administered 2017-05-15: 1000 mL via INTRAVENOUS

## 2017-05-15 MED ORDER — ONDANSETRON 4 MG PO TBDP
4.0000 mg | ORAL_TABLET | Freq: Once | ORAL | Status: AC | PRN
  Administered 2017-05-15: 4 mg via ORAL
  Filled 2017-05-15: qty 1

## 2017-05-15 MED ORDER — KETOROLAC TROMETHAMINE 15 MG/ML IJ SOLN
15.0000 mg | Freq: Once | INTRAMUSCULAR | Status: AC
  Administered 2017-05-15: 15 mg via INTRAVENOUS
  Filled 2017-05-15: qty 1

## 2017-05-15 MED ORDER — ONDANSETRON 4 MG PO TBDP: 4.0000 mg | ORAL_TABLET | Freq: Three times a day (TID) | ORAL | 0 refills | Status: DC | PRN

## 2017-05-15 MED ORDER — OXYCODONE-ACETAMINOPHEN 5-325 MG PO TABS: 1.0000 | ORAL_TABLET | Freq: Four times a day (QID) | ORAL | 0 refills | Status: DC | PRN

## 2017-05-15 MED ORDER — ONDANSETRON HCL 4 MG/2ML IJ SOLN
4.0000 mg | Freq: Once | INTRAMUSCULAR | Status: AC
  Administered 2017-05-15: 4 mg via INTRAVENOUS
  Filled 2017-05-15: qty 2

## 2017-05-15 MED ORDER — TAMSULOSIN HCL 0.4 MG PO CAPS: 0.4000 mg | ORAL_CAPSULE | Freq: Every day | ORAL | 0 refills | Status: DC

## 2017-05-15 NOTE — ED Notes (Signed)
Patient asked for urine sample, unable to give sample at this time. 

## 2017-05-15 NOTE — Discharge Instructions (Signed)
Your urine and symptoms were consistent with kidney stone we do not see any on CT.  Please play" your symptoms, if you have any increased pain, shortness of breath, headache, chest pain please return immediately to the emergency department.  As we discussed we are unsure what exactly caused your pain so please return with any concerns.  We also want you to follow-up with urology.

## 2017-05-15 NOTE — ED Provider Notes (Signed)
[facility] EMERGENCY DEPARTMENT Provider Note   CSN: [id number] Arrival date & time: 05/15/17  1029     History   Chief Complaint Chief Complaint  Patient presents with  . Pelvic Pain  . Emesis    HPI Brandon Dougal. is a 67 y.o. male.  HPI  Patient is a 67 year old male presenting with right groin pain.  He reports it feels identical to his last kidney stone.  He reports he had a kidney stone several months ago, was seen by urology, done lithotripsy. Had negative imaging in February.  Patient reports that last night pain started.  He has had vomiting several times today secondary to pain.  No back pain.  No CVA pain.  No urinary symptoms such as fever, or burning with urination.   Past Medical History:  Diagnosis Date  . Anxiety   . Arthritis   . Benign neoplasm of colon   . Benign prostatic hyperplasia with urinary obstruction   . Bilateral pulmonary embolism ([place]) 3/14   admitted to [facility],  treated with Xarelto  . Cataract   . Chronic pain   . History of kidney stones   . History of pulmonary embolism 03/19/2012  . Hyperlipemia   . Hypertension   . Insomnia   . Major depressive disorder, recurrent episode ([place])   . Mitral regurgitation 04/24/2013   Mild by TEE  . Obesity   . OSA (obstructive sleep apnea)    noncompliant with CPAP.  07/27/13- awaiting a CPAP- unable to tolerate mask  . Osteoporosis   . Persistent atrial fibrillation ([place])   . Restless leg syndrome    takes gabapentin  . Sciatica   . SVT (supraventricular tachycardia) ([place]) 03/07/2016  . Thrombus of left atrial appendage 03/09/2013  . Ventral hernia, unspecified, without mention of obstruction or gangrene    right abdominal wall    Patient Active Problem List   Diagnosis Date Noted  . Insomnia due to medical condition 12/23/2016  . Diverticulosis of colon without hemorrhage 12/02/2016  . BPH (benign prostatic hyperplasia) 10/21/2016  . History of kidney stones  10/21/2016  . Essential hypertension 10/21/2016  . MDD (major depressive disorder) 10/21/2016  . Obesity (BMI 30-39.9) 03/07/2016  . History of alcohol abuse 03/07/2016  . History of colon polyps 11/18/2015  . A-fib ([place]) 07/30/2015  . Paroxysmal atrial fibrillation (H[facility])   . Restless legs syndrome 02/08/2014  . Generalized anxiety disorder 12/12/2013  . Recurrent unilateral inguinal hernia 07/17/2013  . Persistent atrial fibrillation ([place]) 04/26/2013  . Nonrheumatic mitral valve insufficiency 04/25/2013  . Encounter for therapeutic drug monitoring 04/04/2013  . Current use of long term anticoagulation   . Embolism and thrombosis ([place]) 03/14/2013  . Obstructive sleep apnea 03/09/2013  . Back pain 03/21/2012  . HLD (hyperlipidemia) 03/21/2012  . Arthralgia of hip 03/21/2012  . History of pulmonary embolism 03/19/2012  . Bilateral inguinal hernia 05/29/2011  . Uncomplicated asthma 86/57/8469  . Anxiety state 07/18/2007    Past Surgical History:  Procedure Laterality Date  . CARDIOVERSION N/A 03/21/2012   Procedure: CARDIOVERSION;  Surgeon: Birdie Riddle, MD;  Location: [facility] ENDOSCOPY;  Service: Cardiovascular;  Laterality: N/A;  . CARDIOVERSION N/A 04/24/2013   Procedure: CARDIOVERSION;  Surgeon: Dorothy Spark, MD;  Location: [facility] ENDOSCOPY;  Service: Cardiovascular;  Laterality: N/A;  . CARDIOVERSION N/A 06/27/2015   Procedure: CARDIOVERSION;  Surgeon: Larey Dresser, MD;  Location: [facility];  Service: Cardiovascular;  Laterality: N/A;  . CATARACT  EXTRACTION    . COLONOSCOPY W/ POLYPECTOMY    . ELECTROPHYSIOLOGIC STUDY N/A 07/30/2015   Procedure: Atrial Fibrillation Ablation;  Surgeon: Thompson Grayer, MD;  Location: [facility] CV LAB;  Service: Cardiovascular;  Laterality: N/A;  . EXTRACORPOREAL SHOCK WAVE LITHOTRIPSY Right 10/29/2016   Procedure: RIGHT EXTRACORPOREAL SHOCK WAVE LITHOTRIPSY (ESWL);  Surgeon: Alexis Frock, MD;  Location: [facility] ORS;  Service: Urology;   Laterality: Right;  . EYE SURGERY Right    cataract  . FEMUR FRACTURE SURGERY    . HERNIA REPAIR  07/06/2011  . INGUINAL HERNIA REPAIR Right 07/28/2013   Procedure: RIGHT INGUINAL HERNIA REPAIR;  Surgeon: Imogene Burn. Georgette Dover, MD;  Location: [facility];  Service: General;  Laterality: Right;  . INGUINAL HERNIA REPAIR Right 09/22/2016   Procedure: LAPAROSCOPIC RIGHT INGUINAL HERNIA;  Surgeon: Kinsinger, Arta Bruce, MD;  Location: [facility] ORS;  Service: General;  Laterality: Right;  With MESH  . INSERTION OF MESH Right 07/28/2013   Procedure: INSERTION OF MESH;  Surgeon: Imogene Burn. Georgette Dover, MD;  Location: [facility];  Service: General;  Laterality: Right;  . KNEE ARTHROSCOPY     left  . REPLACEMENT TOTAL KNEE Left   . ROTATOR CUFF REPAIR     right  . ROTATOR CUFF REPAIR Left 03/08/2014   DR SUPPLE  . SHOULDER ARTHROSCOPY WITH ROTATOR CUFF REPAIR AND SUBACROMIAL DECOMPRESSION Left 03/08/2014   Procedure: LEFT SHOULDER ARTHROSCOPY WITH ROTATOR CUFF REPAIR/SUBACROMIAL DECOMPRESSION/DISTAL CLAVICLE RESECTION;  Surgeon: Marin Shutter, MD;  Location: [facility];  Service: Orthopedics;  Laterality: Left;  . TEE WITHOUT CARDIOVERSION N/A 03/21/2012   Procedure: TRANSESOPHAGEAL ECHOCARDIOGRAM (TEE);  Surgeon: Birdie Riddle, MD;  Location: [facility];  Service: Cardiovascular;  Laterality: N/A;  . TEE WITHOUT CARDIOVERSION N/A 04/24/2013   Procedure: TRANSESOPHAGEAL ECHOCARDIOGRAM (TEE);  Surgeon: Dorothy Spark, MD;  Location: [facility];  Service: Cardiovascular;  Laterality: N/A;  . TEE WITHOUT CARDIOVERSION N/A 07/29/2015   Procedure: TRANSESOPHAGEAL ECHOCARDIOGRAM (TEE);  Surgeon: Fay Records, MD;  Location: [facility] ENDOSCOPY;  Service: Cardiovascular;  Laterality: N/A;  . TONSILLECTOMY          Home Medications    Prior to Admission medications   Medication Sig Start Date End Date Taking? Authorizing Provider  acetaminophen (TYLENOL) 325 MG tablet You can take 2 tablets every 4 hours as need and alternate with the  prescribed pain medicine, which also has Tylenol.    DO NOT TAKE MORE THAN 4000 MG OF TYLENOL PER DAY.  IT CAN HARM YOUR LIVER.  TYLENOL (ACETAMINOPHEN) IS ALSO IN YOUR PRESCRIPTION PAIN MEDICATION.  YOU HAVE TO COUNT IT IN YOUR DAILY TOTAL. 09/23/16   Earnstine Regal, PA-C  amLODipine (NORVASC) 5 MG tablet Take 1 tablet (5 mg total) by mouth daily. 12/02/16   [place] Bing, DO  atorvastatin (LIPITOR) 40 MG tablet Take 1 tablet (40 mg total) by mouth daily. 11/04/16   [place] Bing, DO  ATROVENT HFA 17 MCG/ACT inhaler TAKE 2 PUFFS BY MOUTH EVERY 6 HOURS AS NEEDED FOR WHEEZE 02/22/17   [place] Bing, DO  carvedilol (COREG) 25 MG tablet Take 1 tablet (25 mg total) by mouth 2 (two) times daily with a meal. 12/02/16   [place] Bing, DO  escitalopram (LEXAPRO) 20 MG tablet Take 1 tablet (20 mg total) by mouth daily. 04/20/17   [place] Bing, DO  finasteride (PROSCAR) 5 MG tablet TAKE 1 TABLET BY MOUTH EVERY DAY 03/25/17   [place] Bing, DO  gabapentin (NEURONTIN)  800 MG tablet Take 1 tablet (800 mg total) by mouth 2 (two) times daily. 11/04/16   [place] Bing, DO  losartan (COZAAR) 100 MG tablet Take 1 tablet (100 mg total) by mouth daily. 10/26/16   Allred, Jeneen Rinks, MD  ondansetron (ZOFRAN ODT) 4 MG disintegrating tablet Take 1 tablet (4 mg total) by mouth every 8 (eight) hours as needed for nausea or vomiting. 05/15/17   Markel Kurtenbach Lyn, MD  oxyCODONE-acetaminophen (PERCOCET/ROXICET) 5-325 MG tablet Take 1 tablet by mouth every 6 (six) hours as needed for severe pain. 05/15/17   Malli Falotico Lyn, MD  spironolactone (ALDACTONE) 25 MG tablet Take 1 tablet (25 mg total) by mouth daily. 10/26/16   Allred, Jeneen Rinks, MD  tamsulosin (FLOMAX) 0.4 MG CAPS capsule Take 1 capsule (0.4 mg total) by mouth 2 (two) times daily. 12/02/16   [place] Bing, DO  tamsulosin (FLOMAX) 0.4 MG CAPS capsule Take 1 capsule (0.4 mg total) by mouth daily. 05/15/17   Yanin Muhlestein Lyn, MD    warfarin (COUMADIN) 5 MG tablet TAKE 1 TABLET AS DIRECTED BY COUMADIN CLINIC 04/12/17   Thompson Grayer, MD    Family History Family History  Problem Relation Age of Onset  . Cancer Father        bone  . Heart failure Mother   . Hypertension Mother   . Asthma Mother   . Cancer Paternal Grandmother        ovarian  . CVA Other        Fam Hx of multiple myeloma  . Diabetes Other        Fam Hx of DM    Social History Social History   Tobacco Use  . Smoking status: Former Smoker    Packs/day: 0.50    Years: 5.00    Pack years: 2.50    Types: Cigarettes  . Smokeless tobacco: Never Used  Substance Use Topics  . Alcohol use: No    Comment: Former EtOH abuse, stopped 06/2016  . Drug use: No    Comment: negative hx for IV drug abuse     Allergies   Ace inhibitors and Albuterol   Review of Systems Review of Systems  Constitutional: Negative for activity change.  Respiratory: Negative for shortness of breath.   Cardiovascular: Negative for chest pain.  Gastrointestinal: Positive for abdominal pain.  Genitourinary: Positive for dysuria.  All other systems reviewed and are negative.    Physical Exam Updated Vital Signs BP 109/79 (BP Location: Right Arm)   Pulse 78   Temp 98 F (36.7 C) (Oral)   Resp 16   SpO2 100%   Physical Exam  Constitutional: He is oriented to person, place, and time. He appears well-nourished.  HENT:  Head: Normocephalic.  Eyes: Conjunctivae are normal.  Cardiovascular: Normal rate.  Pulmonary/Chest: Effort normal and breath sounds normal. No respiratory distress.  Abdominal:  No tenderness.  Neurological: He is oriented to person, place, and time.  Skin: Skin is warm and dry. He is not diaphoretic.  Psychiatric: He has a normal mood and affect. His behavior is normal.     ED Treatments / Results  Labs (all labs ordered are listed, but only abnormal results are displayed) Labs Reviewed  COMPREHENSIVE METABOLIC PANEL - Abnormal;  Notable for the following components:      Result Value   Potassium 3.3 (*)    Glucose, Bld 133 (*)    All other components within normal limits  URINALYSIS, ROUTINE W REFLEX MICROSCOPIC -  Abnormal; Notable for the following components:   Hgb urine dipstick MODERATE (*)    Protein, ur 100 (*)    All other components within normal limits  LIPASE, BLOOD  CBC    EKG None  Radiology Ct Renal Stone Study  Result Date: 05/15/2017 CLINICAL DATA:  67 year old male with acute RIGHT flank and abdominal pain today. EXAM: CT ABDOMEN AND PELVIS WITHOUT CONTRAST TECHNIQUE: Multidetector CT imaging of the abdomen and pelvis was performed following the standard protocol without IV contrast. COMPARISON:  None. FINDINGS: Please note that parenchymal abnormalities may be missed without intravenous contrast. Lower chest: No acute abnormality. Hepatobiliary: The liver and gallbladder are unremarkable. No biliary dilatation. Pancreas: Unremarkable Spleen: Unremarkable. Adrenals/Urinary Tract: 2 non-obstructing RIGHT renal calculi are identified measuring 2 mm in the mid RIGHT kidney and 3 mm in the LOWER RIGHT kidney. Mild bilateral perinephric stranding is unchanged/chronic. A probable cyst within the MEDIAL mid-UPPER RIGHT kidney is unchanged. There is no evidence of hydronephrosis or obstructing urinary calculi. The adrenal glands and bladder are unremarkable. Stomach/Bowel: Stomach is within normal limits. Appendix appears normal. No evidence of bowel wall thickening, distention, or inflammatory changes. Vascular/Lymphatic: Aortic atherosclerosis. No enlarged abdominal or pelvic lymph nodes. Reproductive: Mild prostate enlargement again noted. Other: No ascites, pneumoperitoneum or abscess. Musculoskeletal: No acute or suspicious bony abnormalities identified. Degenerative changes within the lumbar spine and hips again noted. Anterior LEFT hip effusion/ganglion measuring 3 cm again noted. IMPRESSION: 1. No evidence  of acute abnormality. No hydronephrosis or obstructing urinary calculi. 2. Degenerative changes within the lumbar spine and hips again noted. 3.  Aortic Atherosclerosis (ICD10-I70.0). Electronically Signed   By: Margarette Canada M.D.   On: 05/15/2017 15:17    Procedures Procedures (including critical care time)  Medications Ordered in ED Medications  oxyCODONE-acetaminophen (PERCOCET/ROXICET) 5-325 MG per tablet 2 tablet (2 tablets Oral Given 05/15/17 1113)  ketorolac (TORADOL) 15 MG/ML injection 15 mg (has no administration in time range)  ondansetron (ZOFRAN-ODT) disintegrating tablet 4 mg (4 mg Oral Given 05/15/17 1113)  sodium chloride 0.9 % bolus 1,000 mL (1,000 mLs Intravenous New Bag/Given 05/15/17 1411)  ondansetron (ZOFRAN) injection 4 mg (4 mg Intravenous Given 05/15/17 1408)  morphine 4 MG/ML injection 4 mg (4 mg Intravenous Given 05/15/17 1409)     Initial Impression / Assessment and Plan / ED Course  I have reviewed the triage vital signs and the nursing notes.  Pertinent labs & imaging results that were available during my care of the patient were reviewed by me and considered in my medical decision making (see chart for details).    Patient is a 67 year old male presenting with right groin pain.  He reports it feels identical to his last kidney stone.  He reports he had a kidney stone several months ago, was seen by urology, done lithotripsy. Had negative imaging in February.  Patient reports that last night pain started.  He has had vomiting several times today secondary to pain.  No back pain.  No CVA pain.  No urinary symptoms such as fever, or burning with urination.   2:02 PM Patient's presentation consistent with kidney stone.  Will get CT stone, give pain control fluids.  Awaiting urine.  Patient feels much better.  Patient is urine shows hemoglobin.  Patient CT shows no evidence of stone.  I wonder if patient recently passed the stone.    We will give him symptomatic  care in case it returns.  We will follow-up with urology as an  outpatient.  Doubt other pathology such as dissection, intra-abdominal pathology given his normal exam, no abdominal tenderness, normal vital signs, and reassuring other labs.  Final Clinical Impressions(s) / ED Diagnoses   Final diagnoses:  Kidney stone    ED Discharge Orders        Ordered    ondansetron (ZOFRAN ODT) 4 MG disintegrating tablet  Every 8 hours PRN     05/15/17 1528    oxyCODONE-acetaminophen (PERCOCET/ROXICET) 5-325 MG tablet  Every 6 hours PRN     05/15/17 1528    tamsulosin (FLOMAX) 0.4 MG CAPS capsule  Daily     05/15/17 1528       Akacia Boltz Lyn, MD 05/15/17 1529

## 2017-05-15 NOTE — ED Triage Notes (Signed)
Pt endorses right pelvic/hip pain with n/v. Pt had recent hx of kidney stones and this feels similar. VSS.

## 2017-05-15 NOTE — ED Notes (Signed)
Patient transported to CT 

## 2017-05-18 DIAGNOSIS — R1031 Right lower quadrant pain: Secondary | ICD-10-CM | POA: Diagnosis not present

## 2017-05-18 DIAGNOSIS — N200 Calculus of kidney: Secondary | ICD-10-CM | POA: Diagnosis not present

## 2017-05-18 DIAGNOSIS — R3121 Asymptomatic microscopic hematuria: Secondary | ICD-10-CM | POA: Diagnosis not present

## 2017-05-19 DIAGNOSIS — M545 Low back pain: Secondary | ICD-10-CM | POA: Diagnosis not present

## 2017-05-19 DIAGNOSIS — M7061 Trochanteric bursitis, right hip: Secondary | ICD-10-CM | POA: Diagnosis not present

## 2017-05-19 DIAGNOSIS — M5417 Radiculopathy, lumbosacral region: Secondary | ICD-10-CM | POA: Diagnosis not present

## 2017-05-24 DIAGNOSIS — M5416 Radiculopathy, lumbar region: Secondary | ICD-10-CM | POA: Diagnosis not present

## 2017-05-24 DIAGNOSIS — M545 Low back pain: Secondary | ICD-10-CM | POA: Diagnosis not present

## 2017-06-07 ENCOUNTER — Ambulatory Visit: Payer: Medicare Other | Admitting: Internal Medicine

## 2017-06-08 DIAGNOSIS — M519 Unspecified thoracic, thoracolumbar and lumbosacral intervertebral disc disorder: Secondary | ICD-10-CM | POA: Diagnosis not present

## 2017-06-08 DIAGNOSIS — M545 Low back pain: Secondary | ICD-10-CM | POA: Diagnosis not present

## 2017-06-15 ENCOUNTER — Encounter (INDEPENDENT_AMBULATORY_CARE_PROVIDER_SITE_OTHER): Payer: Self-pay | Admitting: Physical Medicine and Rehabilitation

## 2017-06-15 ENCOUNTER — Ambulatory Visit (INDEPENDENT_AMBULATORY_CARE_PROVIDER_SITE_OTHER): Payer: Medicare Other

## 2017-06-15 ENCOUNTER — Ambulatory Visit (INDEPENDENT_AMBULATORY_CARE_PROVIDER_SITE_OTHER): Payer: Medicare Other | Admitting: Physical Medicine and Rehabilitation

## 2017-06-15 VITALS — BP 116/79 | HR 67 | Temp 98.2°F

## 2017-06-15 DIAGNOSIS — Z86711 Personal history of pulmonary embolism: Secondary | ICD-10-CM

## 2017-06-15 DIAGNOSIS — M545 Low back pain: Secondary | ICD-10-CM | POA: Diagnosis not present

## 2017-06-15 DIAGNOSIS — M5415 Radiculopathy, thoracolumbar region: Secondary | ICD-10-CM

## 2017-06-15 DIAGNOSIS — Z7901 Long term (current) use of anticoagulants: Secondary | ICD-10-CM | POA: Diagnosis not present

## 2017-06-15 DIAGNOSIS — M519 Unspecified thoracic, thoracolumbar and lumbosacral intervertebral disc disorder: Secondary | ICD-10-CM | POA: Diagnosis not present

## 2017-06-15 DIAGNOSIS — G8929 Other chronic pain: Secondary | ICD-10-CM | POA: Diagnosis not present

## 2017-06-15 MED ORDER — DEXAMETHASONE SODIUM PHOSPHATE 10 MG/ML IJ SOLN
15.0000 mg | Freq: Once | INTRAMUSCULAR | Status: AC
  Administered 2017-06-15: 15 mg

## 2017-06-15 NOTE — Progress Notes (Signed)
Numeric Pain Rating Scale and Functional Assessment Average Pain 9   In the last MONTH (on 0-10 scale) has pain interfered with the following?  1. General activity like being  able to carry out your everyday physical activities such as walking, climbing stairs, carrying groceries, or moving a chair?  Rating(4)   +Driver, +BT coumadin, -Dye Allergies.

## 2017-06-15 NOTE — Patient Instructions (Signed)

## 2017-06-16 ENCOUNTER — Encounter (INDEPENDENT_AMBULATORY_CARE_PROVIDER_SITE_OTHER): Payer: Self-pay | Admitting: Physical Medicine and Rehabilitation

## 2017-06-16 NOTE — Procedures (Signed)
Thoracic Transforaminal Epidural Steroid Injection - Sub-Pedicular Approach with Fluoroscopic Guidance  Patient: Brandon [name].      Date of Birth: 14-Jul-1950 MRN: [id number] PCP: [place] Bing, DO      Visit Date: 06/15/2017   Universal Protocol:    Date/Time: 06/15/2017  Consent Given By: the patient  Position: PRONE  Additional Comments: Vital signs were monitored before and after the procedure. Patient was prepped and draped in the usual sterile fashion. The correct patient, procedure, and site was verified.   Injection Procedure Details:  Procedure Site One Meds Administered:  Meds ordered this encounter  Medications  . dexamethasone (DECADRON) injection 15 mg    Laterality: Right  Location/Site:  T12-L1  Needle size: 22 G  Needle type: Spinal  Needle Placement: Transforaminal  Findings:    -Comments: Excellent flow of contrast along the nerve and into the epidural space.  Procedure Details: After squaring off the end-plates to get a true AP view, the C-arm was positioned so that an oblique view of the foramen as noted above was visualized. The target area is just inferior to the "nose of the scotty dog" or sub pedicular. The soft tissues overlying this structure were infiltrated with 2-3 ml. of 1% Lidocaine without Epinephrine.  The spinal needle was inserted toward the target using a "trajectory" view along the fluoroscope beam.  Under AP and lateral visualization, the needle was advanced so it did not puncture dura and was located close the 6 O'Clock position of the pedical in AP tracterory. Biplanar projections were used to confirm position. Aspiration was confirmed to be negative for CSF and/or blood. A 1-2 ml. volume of Isovue-250 was injected and flow of contrast was noted at each level. Radiographs were obtained for documentation purposes.   After attaining the desired flow of contrast documented above, a 0.5 to 1.0 ml test dose of 0.25% Marcaine was  injected into each respective transforaminal space.  The patient was observed for 90 seconds post injection.  After no sensory deficits were reported, and normal lower extremity motor function was noted,   the above injectate was administered so that equal amounts of the injectate were placed at each foramen (level) into the transforaminal epidural space.   Additional Comments:  The patient tolerated the procedure well Dressing: Band-Aid    Post-procedure details: Patient was observed during the procedure. Post-procedure instructions were reviewed.  Patient left the clinic in stable condition.

## 2017-06-16 NOTE — Progress Notes (Addendum)
Brandon [name]. - 67 y.o. male MRN [id number]  Date of birth: 06-08-1950  Office Visit Note: Visit Date: 06/15/2017 PCP: [place] Bing, DO Referred by: Brandon [place] Bing, DO  Subjective: Chief Complaint  Patient presents with  . Lower Back - Pain  . Right Hip - Pain   HPI: Brandon [name] is a 67 year old gentleman that comes in today at the request of Brandon [name] at [facility] for management of right hip and thigh pain felt to be related to a T12 foraminal disc herniation and stenosis.  The patient reports after doing some yard work early in the month of May he began having increasing low back pain referring into the buttock and then ultimately into the anterior right groin area.  He had noted no specific injury however.  He has had no symptoms down the legs or into the feet and no paresthesias.  He reports pretty severe pain at times.  He gets worsening symptoms when he is standing in upright position he has fairly felt like nothing is made it better at all.  He was seen and evaluated by Brandon [name] and received an injection in the greater trochanteric.  MRI of the lumbar spine was obtained and this is reviewed below and reviewed with the patient.  He does have a T12-L1 foraminal disc protrusion which would fit with his current symptoms.  He was felt to have decent range of motion of the hips without reproduction of pain.  He has had injections in the past by Brandon [name].  He has had a chronic history of back and hip pain in the past.  He has had no red flag symptoms of focal weakness or fevers chills or night sweats or weight loss.  His case is complicated by chronic Coumadin anticoagulation.  He has a history of atrial fibrillation but also history of embolism and thrombosis.  He was also seen at the emergency department and kidney stone was essentially ruled out.   Review of Systems  Constitutional: Negative for chills, fever, malaise/fatigue and weight loss.  HENT: Negative for  hearing loss and sinus pain.   Eyes: Negative for blurred vision, double vision and photophobia.  Respiratory: Negative for cough and shortness of breath.   Cardiovascular: Negative for chest pain, palpitations and leg swelling.  Gastrointestinal: Negative for abdominal pain, nausea and vomiting.  Genitourinary: Negative for flank pain.  Musculoskeletal: Positive for back pain and joint pain. Negative for myalgias.  Skin: Negative for itching and rash.  Neurological: Negative for tremors, focal weakness and weakness.  Endo/Heme/Allergies: Negative.   Psychiatric/Behavioral: Negative for depression.  All other systems reviewed and are negative.  Otherwise per HPI.  Assessment & Plan: Visit Diagnoses:  1. Radiculopathy of thoracolumbar region   2. Intervertebral thoracic disc disorder   3. Chronic right-sided low back pain without sciatica   4. History of pulmonary embolism   5. Anticoagulant long-term use     Plan: Findings:  Worsening severe right low back and referral pain to the anterior thigh and a fairly classic T12 distribution.  No real pain with hip rotation and clearly MRI findings consistent with radicular pattern.  We are going to complete a right T12 transforaminal epidural steroid injection.  This is essentially the same terminology of selective nerve root block.  We will use dexamethasone.  We did have a long discussion with the patient concerning transforaminal epidural steroid injections with the concomitant use of anticoagulation.  Newer standards on board  at this time although there is some debate that we generally feel that it is safe to complete the transforaminal injections while anticoagulated.  We talked about the risk the benefit of being off the Coumadin versus staying on the Coumadin.  Patient did want to proceed with the injection.  He will follow-up with Brandon [name] with likely plan of physical therapy and possible repeat injection whether that is with [place] or Brandon [name].   I was happy to try to get the patient in a timely fashion and we did have him have an opening.    Meds & Orders:  Meds ordered this encounter  Medications  . dexamethasone (DECADRON) injection 15 mg    Orders Placed This Encounter  Procedures  . XR C-ARM NO REPORT  . Epidural Steroid injection    Follow-up: Return if symptoms worsen or fail to improve, for Brandon [name].   Procedures: No procedures performed  Thoracic Transforaminal Epidural Steroid Injection - Sub-Pedicular Approach with Fluoroscopic Guidance  Patient: Brandon [name].      Date of Birth: 10-28-1950 MRN: [id number] PCP: Brandon Bing, DO      Visit Date: 06/15/2017   Universal Protocol:    Date/Time: 06/15/2017  Consent Given By: the patient  Position: PRONE  Additional Comments: Vital signs were monitored before and after the procedure. Patient was prepped and draped in the usual sterile fashion. The correct patient, procedure, and site was verified.   Injection Procedure Details:  Procedure Site One Meds Administered:  Meds ordered this encounter  Medications  . dexamethasone (DECADRON) injection 15 mg    Laterality: Right  Location/Site:  T12-L1  Needle size: 22 G  Needle type: Spinal  Needle Placement: Transforaminal  Findings:    -Comments: Excellent flow of contrast along the nerve and into the epidural space.  Procedure Details: After squaring off the end-plates to get a true AP view, the C-arm was positioned so that an oblique view of the foramen as noted above was visualized. The target area is just inferior to the "nose of the scotty dog" or sub pedicular. The soft tissues overlying this structure were infiltrated with 2-3 ml. of 1% Lidocaine without Epinephrine.  The spinal needle was inserted toward the target using a "trajectory" view along the fluoroscope beam.  Under AP and lateral visualization, the needle was advanced so it did not puncture dura and was located close the  6 O'Clock position of the pedical in AP tracterory. Biplanar projections were used to confirm position. Aspiration was confirmed to be negative for CSF and/or blood. A 1-2 ml. volume of Isovue-250 was injected and flow of contrast was noted at each level. Radiographs were obtained for documentation purposes.   After attaining the desired flow of contrast documented above, a 0.5 to 1.0 ml test dose of 0.25% Marcaine was injected into each respective transforaminal space.  The patient was observed for 90 seconds post injection.  After no sensory deficits were reported, and normal lower extremity motor function was noted,   the above injectate was administered so that equal amounts of the injectate were placed at each foramen (level) into the transforaminal epidural space.   Additional Comments:  The patient tolerated the procedure well Dressing: Band-Aid    Post-procedure details: Patient was observed during the procedure. Post-procedure instructions were reviewed.  Patient left the clinic in stable condition.    Clinical History: Lumbar spine MRI dated 05/24/2017 by report from a [facility] Impression: : Interval increase in size  of moderate right foraminal T12-L1 disc extrusion with mild cranial migration and resulting moderate severe right foraminal narrowing with some impingement and displacement of the exiting right T12 nerve root.  New left T11-12 synovial cyst with probable moderate spinal canal stenosis and some indenting of the left hemicord.  There is also moderate bilateral L3-4 foraminal narrowing   He reports that he has quit smoking. His smoking use included cigarettes. He has a 2.50 pack-year smoking history. He has never used smokeless tobacco.  Recent Labs    09/24/16 0336  HGBA1C 5.1    Objective:  VS:  HT:    WT:   BMI:     BP:116/79  HR:67bpm  TEMP:98.2 F (36.8 C)( )  RESP:95 % Physical Exam  Constitutional: He is oriented to person, place, and time. He  appears well-developed and well-nourished. No distress.  HENT:  Head: Normocephalic and atraumatic.  Nose: Nose normal.  Mouth/Throat: Oropharynx is clear and moist.  Eyes: Pupils are equal, round, and reactive to light. Conjunctivae are normal.  Neck: Normal range of motion. Neck supple. No JVD present. No tracheal deviation present.  Cardiovascular: Normal rate and intact distal pulses.  Pulmonary/Chest: Effort normal and breath sounds normal.  Abdominal: Soft. He exhibits no distension. There is no rebound and no guarding.  Musculoskeletal: He exhibits no deformity.  Patient ambulates without aid but is very slow to rise from a seated position.  He does have pain with extension of the lumbar spine.  He has no pain with internal or external rotation of the hips.  He has pain with rotation again of the lumbar spine with negative slump test.  He has good strength with hip flexion and dorsiflexion plantarflexion EHL.  Neurological: He is alert and oriented to person, place, and time. He exhibits normal muscle tone. Coordination normal.  Skin: Skin is warm. No rash noted.  Psychiatric: He has a normal mood and affect. His behavior is normal.  Nursing note and vitals reviewed.   Ortho Exam Imaging: Xr C-arm No Report  Result Date: 06/15/2017 Please see Notes or Procedures tab for imaging impression.   Past Medical/Family/Surgical/Social History: Medications & Allergies reviewed per EMR, new medications updated. Patient Active Problem List   Diagnosis Date Noted  . Insomnia due to medical condition 12/23/2016  . Diverticulosis of colon without hemorrhage 12/02/2016  . BPH (benign prostatic hyperplasia) 10/21/2016  . History of kidney stones 10/21/2016  . Essential hypertension 10/21/2016  . MDD (major depressive disorder) 10/21/2016  . Obesity (BMI 30-39.9) 03/07/2016  . History of alcohol abuse 03/07/2016  . History of colon polyps 11/18/2015  . A-fib ([place]) 07/30/2015  . Paroxysmal  atrial fibrillation (HCC)   . Restless legs syndrome 02/08/2014  . Generalized anxiety disorder 12/12/2013  . Recurrent unilateral inguinal hernia 07/17/2013  . Persistent atrial fibrillation ([place]) 04/26/2013  . Nonrheumatic mitral valve insufficiency 04/25/2013  . Encounter for therapeutic drug monitoring 04/04/2013  . Current use of long term anticoagulation   . Embolism and thrombosis ([place]) 03/14/2013  . Obstructive sleep apnea 03/09/2013  . Back pain 03/21/2012  . HLD (hyperlipidemia) 03/21/2012  . Arthralgia of hip 03/21/2012  . History of pulmonary embolism 03/19/2012  . Bilateral inguinal hernia 05/29/2011  . Uncomplicated asthma 69/62/9528  . Anxiety state 07/18/2007   Past Medical History:  Diagnosis Date  . Anxiety   . Arthritis   . Benign neoplasm of colon   . Benign prostatic hyperplasia with urinary obstruction   . Bilateral pulmonary embolism (  [place]) 3/14   admitted to [facility],  treated with Xarelto  . Cataract   . Chronic pain   . History of kidney stones   . History of pulmonary embolism 03/19/2012  . Hyperlipemia   . Hypertension   . Insomnia   . Major depressive disorder, recurrent episode ([place])   . Mitral regurgitation 04/24/2013   Mild by TEE  . Obesity   . OSA (obstructive sleep apnea)    noncompliant with CPAP.  07/27/13- awaiting a CPAP- unable to tolerate mask  . Osteoporosis   . Persistent atrial fibrillation ([place])   . Restless leg syndrome    takes gabapentin  . Sciatica   . SVT (supraventricular tachycardia) ([place]) 03/07/2016  . Thrombus of left atrial appendage 03/09/2013  . Ventral hernia, unspecified, without mention of obstruction or gangrene    right abdominal wall   Family History  Problem Relation Age of Onset  . Cancer Father        bone  . Heart failure Mother   . Hypertension Mother   . Asthma Mother   . Cancer Paternal Grandmother        ovarian  . CVA Other        Fam Hx of multiple myeloma  . Diabetes Other        Fam Hx  of DM   Past Surgical History:  Procedure Laterality Date  . CARDIOVERSION N/A 03/21/2012   Procedure: CARDIOVERSION;  Surgeon: Birdie Riddle, MD;  Location: [facility] ENDOSCOPY;  Service: Cardiovascular;  Laterality: N/A;  . CARDIOVERSION N/A 04/24/2013   Procedure: CARDIOVERSION;  Surgeon: Dorothy Spark, MD;  Location: [facility] ENDOSCOPY;  Service: Cardiovascular;  Laterality: N/A;  . CARDIOVERSION N/A 06/27/2015   Procedure: CARDIOVERSION;  Surgeon: Larey Dresser, MD;  Location: [place];  Service: Cardiovascular;  Laterality: N/A;  . CATARACT EXTRACTION    . COLONOSCOPY W/ POLYPECTOMY    . ELECTROPHYSIOLOGIC STUDY N/A 07/30/2015   Procedure: Atrial Fibrillation Ablation;  Surgeon: Thompson Grayer, MD;  Location: [place] CV LAB;  Service: Cardiovascular;  Laterality: N/A;  . EXTRACORPOREAL SHOCK WAVE LITHOTRIPSY Right 10/29/2016   Procedure: RIGHT EXTRACORPOREAL SHOCK WAVE LITHOTRIPSY (ESWL);  Surgeon: Alexis Frock, MD;  Location: [facility] ORS;  Service: Urology;  Laterality: Right;  . EYE SURGERY Right    cataract  . FEMUR FRACTURE SURGERY    . HERNIA REPAIR  07/06/2011  . INGUINAL HERNIA REPAIR Right 07/28/2013   Procedure: RIGHT INGUINAL HERNIA REPAIR;  Surgeon: Imogene Burn. Georgette Dover, MD;  Location: [facility];  Service: General;  Laterality: Right;  . INGUINAL HERNIA REPAIR Right 09/22/2016   Procedure: LAPAROSCOPIC RIGHT INGUINAL HERNIA;  Surgeon: Kinsinger, Arta Bruce, MD;  Location: [facility] ORS;  Service: General;  Laterality: Right;  With MESH  . INSERTION OF MESH Right 07/28/2013   Procedure: INSERTION OF MESH;  Surgeon: Imogene Burn. Georgette Dover, MD;  Location: [place];  Service: General;  Laterality: Right;  . KNEE ARTHROSCOPY     left  . REPLACEMENT TOTAL KNEE Left   . ROTATOR CUFF REPAIR     right  . ROTATOR CUFF REPAIR Left 03/08/2014   DR SUPPLE  . SHOULDER ARTHROSCOPY WITH ROTATOR CUFF REPAIR AND SUBACROMIAL DECOMPRESSION Left 03/08/2014   Procedure: LEFT SHOULDER ARTHROSCOPY WITH ROTATOR CUFF  REPAIR/SUBACROMIAL DECOMPRESSION/DISTAL CLAVICLE RESECTION;  Surgeon: Marin Shutter, MD;  Location: [place];  Service: Orthopedics;  Laterality: Left;  . TEE WITHOUT CARDIOVERSION N/A 03/21/2012   Procedure: TRANSESOPHAGEAL ECHOCARDIOGRAM (TEE);  Surgeon: Lorenda Ishihara  Doylene Canard, MD;  Location: [place] ENDOSCOPY;  Service: Cardiovascular;  Laterality: N/A;  . TEE WITHOUT CARDIOVERSION N/A 04/24/2013   Procedure: TRANSESOPHAGEAL ECHOCARDIOGRAM (TEE);  Surgeon: Dorothy Spark, MD;  Location: [place];  Service: Cardiovascular;  Laterality: N/A;  . TEE WITHOUT CARDIOVERSION N/A 07/29/2015   Procedure: TRANSESOPHAGEAL ECHOCARDIOGRAM (TEE);  Surgeon: Fay Records, MD;  Location: [facility] ENDOSCOPY;  Service: Cardiovascular;  Laterality: N/A;  . TONSILLECTOMY     Social History   Occupational History  . Not on file  Tobacco Use  . Smoking status: Former Smoker    Packs/[name]: 0.50    Years: 5.00    Pack years: 2.50    Types: Cigarettes  . Smokeless tobacco: Never Used  Substance and Sexual Activity  . Alcohol use: No    Comment: Former EtOH abuse, stopped 06/2016  . Drug use: No    Comment: negative hx for IV drug abuse  . Sexual activity: Not on file

## 2017-06-24 ENCOUNTER — Ambulatory Visit (INDEPENDENT_AMBULATORY_CARE_PROVIDER_SITE_OTHER): Payer: Medicare Other | Admitting: *Deleted

## 2017-06-24 DIAGNOSIS — I749 Embolism and thrombosis of unspecified artery: Secondary | ICD-10-CM | POA: Diagnosis not present

## 2017-06-24 DIAGNOSIS — I480 Paroxysmal atrial fibrillation: Secondary | ICD-10-CM

## 2017-06-24 DIAGNOSIS — Z86711 Personal history of pulmonary embolism: Secondary | ICD-10-CM | POA: Diagnosis not present

## 2017-06-24 DIAGNOSIS — Z5181 Encounter for therapeutic drug level monitoring: Secondary | ICD-10-CM

## 2017-06-24 LAB — POCT INR: INR: 4.4 — AB (ref 2.0–3.0)

## 2017-06-24 NOTE — Patient Instructions (Signed)
Description   Do not take coumadin today June 20th then tomorrow June 21st take 1/2 tablet then continue taking 1 tablet (5mg ) daily  . Recheck INR in 2 weeks.  Call with any questions or changes.  Coumadin Clinic [phone]

## 2017-06-25 DIAGNOSIS — M5124 Other intervertebral disc displacement, thoracic region: Secondary | ICD-10-CM | POA: Diagnosis not present

## 2017-06-25 DIAGNOSIS — M519 Unspecified thoracic, thoracolumbar and lumbosacral intervertebral disc disorder: Secondary | ICD-10-CM | POA: Diagnosis not present

## 2017-06-25 DIAGNOSIS — M545 Low back pain: Secondary | ICD-10-CM | POA: Diagnosis not present

## 2017-07-09 ENCOUNTER — Ambulatory Visit (INDEPENDENT_AMBULATORY_CARE_PROVIDER_SITE_OTHER): Payer: Medicare Other | Admitting: *Deleted

## 2017-07-09 DIAGNOSIS — Z5181 Encounter for therapeutic drug level monitoring: Secondary | ICD-10-CM | POA: Diagnosis not present

## 2017-07-09 DIAGNOSIS — I749 Embolism and thrombosis of unspecified artery: Secondary | ICD-10-CM

## 2017-07-09 DIAGNOSIS — I480 Paroxysmal atrial fibrillation: Secondary | ICD-10-CM | POA: Diagnosis not present

## 2017-07-09 LAB — POCT INR: INR: 4.1 — AB (ref 2.0–3.0)

## 2017-07-09 NOTE — Patient Instructions (Signed)
Description   Skip today's dose then start taking 5mg  daily except 2.5mg  on Sundays.  Recheck INR in 2 weeks.  Call with any questions or changes.  Coumadin Clinic [phone]

## 2017-07-23 ENCOUNTER — Ambulatory Visit (INDEPENDENT_AMBULATORY_CARE_PROVIDER_SITE_OTHER): Payer: Medicare Other

## 2017-07-23 DIAGNOSIS — I749 Embolism and thrombosis of unspecified artery: Secondary | ICD-10-CM | POA: Diagnosis not present

## 2017-07-23 DIAGNOSIS — Z5181 Encounter for therapeutic drug level monitoring: Secondary | ICD-10-CM

## 2017-07-23 DIAGNOSIS — I480 Paroxysmal atrial fibrillation: Secondary | ICD-10-CM | POA: Diagnosis not present

## 2017-07-23 LAB — POCT INR: INR: 3.2 — AB (ref 2.0–3.0)

## 2017-07-23 NOTE — Patient Instructions (Signed)
Description   Start taking 5mg  daily except 2.5mg  on Sundays.  Recheck INR in 2 weeks.  Call with any questions or changes.  Coumadin Clinic [phone]

## 2017-08-06 DIAGNOSIS — Z961 Presence of intraocular lens: Secondary | ICD-10-CM | POA: Diagnosis not present

## 2017-08-06 DIAGNOSIS — H43813 Vitreous degeneration, bilateral: Secondary | ICD-10-CM | POA: Diagnosis not present

## 2017-08-09 ENCOUNTER — Ambulatory Visit (INDEPENDENT_AMBULATORY_CARE_PROVIDER_SITE_OTHER): Payer: Medicare Other | Admitting: *Deleted

## 2017-08-09 DIAGNOSIS — Z5181 Encounter for therapeutic drug level monitoring: Secondary | ICD-10-CM

## 2017-08-09 DIAGNOSIS — I749 Embolism and thrombosis of unspecified artery: Secondary | ICD-10-CM | POA: Diagnosis not present

## 2017-08-09 DIAGNOSIS — I480 Paroxysmal atrial fibrillation: Secondary | ICD-10-CM

## 2017-08-09 LAB — POCT INR: INR: 3.7 — AB (ref 2.0–3.0)

## 2017-08-09 NOTE — Patient Instructions (Signed)
Description   Do not take any Coumadin today then start taking 5mg  daily except 2.5mg  on Sundays and Thursdays.  Recheck INR in 2 weeks.  Call with any questions or changes.  Coumadin Clinic [phone]

## 2017-08-12 DIAGNOSIS — M1711 Unilateral primary osteoarthritis, right knee: Secondary | ICD-10-CM | POA: Diagnosis not present

## 2017-08-12 DIAGNOSIS — M25561 Pain in right knee: Secondary | ICD-10-CM | POA: Diagnosis not present

## 2017-08-14 ENCOUNTER — Other Ambulatory Visit: Payer: Self-pay | Admitting: Internal Medicine

## 2017-08-20 DIAGNOSIS — K4091 Unilateral inguinal hernia, without obstruction or gangrene, recurrent: Secondary | ICD-10-CM | POA: Diagnosis not present

## 2017-08-23 ENCOUNTER — Other Ambulatory Visit: Payer: Self-pay | Admitting: Internal Medicine

## 2017-08-23 ENCOUNTER — Ambulatory Visit (INDEPENDENT_AMBULATORY_CARE_PROVIDER_SITE_OTHER): Payer: Medicare Other | Admitting: *Deleted

## 2017-08-23 DIAGNOSIS — I749 Embolism and thrombosis of unspecified artery: Secondary | ICD-10-CM

## 2017-08-23 DIAGNOSIS — Z5181 Encounter for therapeutic drug level monitoring: Secondary | ICD-10-CM

## 2017-08-23 DIAGNOSIS — I480 Paroxysmal atrial fibrillation: Secondary | ICD-10-CM | POA: Diagnosis not present

## 2017-08-23 LAB — POCT INR: INR: 2.1 (ref 2.0–3.0)

## 2017-08-23 NOTE — Patient Instructions (Signed)
Description   Continue taking 5mg  daily except 2.5mg  on Sundays and Thursdays.  Recheck INR in 3 weeks.  Call with any questions or changes.  Coumadin Clinic [phone]

## 2017-09-15 ENCOUNTER — Ambulatory Visit (INDEPENDENT_AMBULATORY_CARE_PROVIDER_SITE_OTHER): Payer: Medicare Other | Admitting: *Deleted

## 2017-09-15 ENCOUNTER — Ambulatory Visit (INDEPENDENT_AMBULATORY_CARE_PROVIDER_SITE_OTHER): Payer: Medicare Other | Admitting: Internal Medicine

## 2017-09-15 ENCOUNTER — Encounter

## 2017-09-15 ENCOUNTER — Encounter: Payer: Self-pay | Admitting: Internal Medicine

## 2017-09-15 VITALS — BP 108/72 | HR 63 | Ht 75.0 in | Wt 269.0 lb

## 2017-09-15 DIAGNOSIS — I749 Embolism and thrombosis of unspecified artery: Secondary | ICD-10-CM | POA: Diagnosis not present

## 2017-09-15 DIAGNOSIS — I481 Persistent atrial fibrillation: Secondary | ICD-10-CM

## 2017-09-15 DIAGNOSIS — G4733 Obstructive sleep apnea (adult) (pediatric): Secondary | ICD-10-CM

## 2017-09-15 DIAGNOSIS — I480 Paroxysmal atrial fibrillation: Secondary | ICD-10-CM | POA: Diagnosis not present

## 2017-09-15 DIAGNOSIS — I471 Supraventricular tachycardia: Secondary | ICD-10-CM | POA: Diagnosis not present

## 2017-09-15 DIAGNOSIS — Z79899 Other long term (current) drug therapy: Secondary | ICD-10-CM | POA: Diagnosis not present

## 2017-09-15 DIAGNOSIS — I4819 Other persistent atrial fibrillation: Secondary | ICD-10-CM

## 2017-09-15 LAB — POCT INR: INR: 3 (ref 2.0–3.0)

## 2017-09-15 LAB — BASIC METABOLIC PANEL
BUN/Creatinine Ratio: 11 (ref 10–24)
BUN: 10 mg/dL (ref 8–27)
CO2: 26 mmol/L (ref 20–29)
Calcium: 9.4 mg/dL (ref 8.6–10.2)
Chloride: 99 mmol/L (ref 96–106)
Creatinine, Ser: 0.87 mg/dL (ref 0.76–1.27)
GFR calc Af Amer: 102 mL/min/{1.73_m2} (ref >59)
GFR calc non Af Amer: 88 mL/min/{1.73_m2} (ref >59)
Glucose: 93 mg/dL (ref 65–99)
Potassium: 4.1 mmol/L (ref 3.5–5.2)
Sodium: 140 mmol/L (ref 134–144)

## 2017-09-15 LAB — MAGNESIUM: Magnesium: 2 mg/dL (ref 1.6–2.3)

## 2017-09-15 NOTE — Patient Instructions (Signed)
Medication Instructions:  Your physician recommends that you continue on your current medications as directed. Please refer to the Current Medication list given to you today.  * If you need a refill on your cardiac medications before your next appointment, please call your pharmacy.   Labwork: Today: BMET & Magnesium level *We will only notify you of abnormal results, otherwise continue current treatment plan.  Testing/Procedures: None ordered  Follow-Up: Your physician wants you to follow-up in: 6 months with Roderic Palau in the AFib clinic.Marland Kitchen  You will receive a reminder letter in the mail two months in advance. If you don't receive a letter, please call our office to schedule the follow-up appointment.   Thank you for choosing [facility]!!

## 2017-09-15 NOTE — Progress Notes (Signed)
PCP: Ballico Bing, DO   Primary EP: Dr Larose Kells. is a 67 y.o. male who presents today for routine electrophysiology followup.  Since last being seen in our clinic, the patient reports doing very well.  Today, he denies symptoms of palpitations, chest pain, shortness of breath,  lower extremity edema, dizziness, presyncope, or syncope.  The patient is otherwise without complaint today.   Past Medical History:  Diagnosis Date  . Anxiety   . Arthritis   . Benign neoplasm of colon   . Benign prostatic hyperplasia with urinary obstruction   . Bilateral pulmonary embolism ([place]) 3/14   admitted to [facility],  treated with Xarelto  . Cataract   . Chronic pain   . History of kidney stones   . History of pulmonary embolism 03/19/2012  . Hyperlipemia   . Hypertension   . Insomnia   . Major depressive disorder, recurrent episode ([place])   . Mitral regurgitation 04/24/2013   Mild by TEE  . Obesity   . OSA (obstructive sleep apnea)    noncompliant with CPAP.  07/27/13- awaiting a CPAP- unable to tolerate mask  . Osteoporosis   . Persistent atrial fibrillation ([place])   . Restless leg syndrome    takes gabapentin  . Sciatica   . SVT (supraventricular tachycardia) ([place]) 03/07/2016  . Thrombus of left atrial appendage 03/09/2013  . Ventral hernia, unspecified, without mention of obstruction or gangrene    right abdominal wall   Past Surgical History:  Procedure Laterality Date  . CARDIOVERSION N/A 03/21/2012   Procedure: CARDIOVERSION;  Surgeon: Birdie Riddle, MD;  Location: [facility] ENDOSCOPY;  Service: Cardiovascular;  Laterality: N/A;  . CARDIOVERSION N/A 04/24/2013   Procedure: CARDIOVERSION;  Surgeon: Dorothy Spark, MD;  Location: [facility] ENDOSCOPY;  Service: Cardiovascular;  Laterality: N/A;  . CARDIOVERSION N/A 06/27/2015   Procedure: CARDIOVERSION;  Surgeon: Larey Dresser, MD;  Location: [place];  Service: Cardiovascular;  Laterality: N/A;  . CATARACT EXTRACTION    .  COLONOSCOPY W/ POLYPECTOMY    . ELECTROPHYSIOLOGIC STUDY N/A 07/30/2015   Procedure: Atrial Fibrillation Ablation;  Surgeon: Thompson Grayer, MD;  Location: [place] CV LAB;  Service: Cardiovascular;  Laterality: N/A;  . EXTRACORPOREAL SHOCK WAVE LITHOTRIPSY Right 10/29/2016   Procedure: RIGHT EXTRACORPOREAL SHOCK WAVE LITHOTRIPSY (ESWL);  Surgeon: Alexis Frock, MD;  [facility];  Service: Urology;  Laterality: Right;  . EYE SURGERY Right    cataract  . FEMUR FRACTURE SURGERY    . HERNIA REPAIR  07/06/2011  . INGUINAL HERNIA REPAIR Right 07/28/2013   Procedure: RIGHT INGUINAL HERNIA REPAIR;  Surgeon: Imogene Burn. Georgette Dover, MD;  Location: [place];  Service: General;  Laterality: Right;  . INGUINAL HERNIA REPAIR Right 09/22/2016   Procedure: LAPAROSCOPIC RIGHT INGUINAL HERNIA;  Surgeon: Kinsinger, Arta Bruce, MD;  [facility];  Service: General;  Laterality: Right;  With MESH  . INSERTION OF MESH Right 07/28/2013   Procedure: INSERTION OF MESH;  Surgeon: Imogene Burn. Georgette Dover, MD;  Location: [place];  Service: General;  Laterality: Right;  . KNEE ARTHROSCOPY     left  . REPLACEMENT TOTAL KNEE Left   . ROTATOR CUFF REPAIR     right  . ROTATOR CUFF REPAIR Left 03/08/2014   DR SUPPLE  . SHOULDER ARTHROSCOPY WITH ROTATOR CUFF REPAIR AND SUBACROMIAL DECOMPRESSION Left 03/08/2014   Procedure: LEFT SHOULDER ARTHROSCOPY WITH ROTATOR CUFF REPAIR/SUBACROMIAL DECOMPRESSION/DISTAL CLAVICLE RESECTION;  Surgeon: Marin Shutter, MD;  Location:  [place] [facility];  Service: Orthopedics;  Laterality: Left;  . TEE WITHOUT CARDIOVERSION N/A 03/21/2012   Procedure: TRANSESOPHAGEAL ECHOCARDIOGRAM (TEE);  Surgeon: Birdie Riddle, MD;  Location: [place];  Service: Cardiovascular;  Laterality: N/A;  . TEE WITHOUT CARDIOVERSION N/A 04/24/2013   Procedure: TRANSESOPHAGEAL ECHOCARDIOGRAM (TEE);  Surgeon: Dorothy Spark, MD;  Location: [place];  Service: Cardiovascular;  Laterality: N/A;  . TEE WITHOUT CARDIOVERSION N/A  07/29/2015   Procedure: TRANSESOPHAGEAL ECHOCARDIOGRAM (TEE);  Surgeon: Fay Records, MD;  Location: [facility] ENDOSCOPY;  Service: Cardiovascular;  Laterality: N/A;  . TONSILLECTOMY      ROS- all systems are reviewed and negatives except as per HPI above  Current Outpatient Medications  Medication Sig Dispense Refill  . acetaminophen (TYLENOL) 325 MG tablet You can take 2 tablets every 4 hours as need and alternate with the prescribed pain medicine, which also has Tylenol.    DO NOT TAKE MORE THAN 4000 MG OF TYLENOL PER DAY.  IT CAN HARM YOUR LIVER.  TYLENOL (ACETAMINOPHEN) IS ALSO IN YOUR PRESCRIPTION PAIN MEDICATION.  YOU HAVE TO COUNT IT IN YOUR DAILY TOTAL.    Marland Kitchen amLODipine (NORVASC) 5 MG tablet Take 1 tablet (5 mg total) by mouth daily. 90 tablet 3  . atorvastatin (LIPITOR) 40 MG tablet Take 1 tablet (40 mg total) by mouth daily. 90 tablet 3  . ATROVENT HFA 17 MCG/ACT inhaler TAKE 2 PUFFS BY MOUTH EVERY 6 HOURS AS NEEDED FOR WHEEZE 12.9 Inhaler 0  . carvedilol (COREG) 25 MG tablet Take 1 tablet (25 mg total) by mouth 2 (two) times daily with a meal. 180 tablet 3  . escitalopram (LEXAPRO) 20 MG tablet Take 1 tablet (20 mg total) by mouth daily. 90 tablet 3  . gabapentin (NEURONTIN) 800 MG tablet Take 1 tablet (800 mg total) by mouth 2 (two) times daily. 180 tablet 3  . losartan (COZAAR) 100 MG tablet Take 1 tablet (100 mg total) by mouth daily. 90 tablet 3  . ondansetron (ZOFRAN ODT) 4 MG disintegrating tablet Take 1 tablet (4 mg total) by mouth every 8 (eight) hours as needed for nausea or vomiting. 5 tablet 0  . oxyCODONE-acetaminophen (PERCOCET/ROXICET) 5-325 MG tablet Take 1 tablet by mouth every 6 (six) hours as needed for severe pain. 5 tablet 0  . spironolactone (ALDACTONE) 25 MG tablet Take 1 tablet (25 mg total) by mouth daily. 90 tablet 3  . tamsulosin (FLOMAX) 0.4 MG CAPS capsule Take 1 capsule (0.4 mg total) by mouth daily. 30 capsule 0  . warfarin (COUMADIN) 5 MG tablet TAKE 1 TABLET  AS DIRECTED BY COUMADIN CLINIC 120 tablet 1   No current facility-administered medications for this visit.     Physical Exam: Vitals:   09/15/17 0940  BP: 108/72  Pulse: 63  SpO2: 93%  Weight: 269 lb (122 kg)  Height: 6\' 3"  (1.905 m)    GEN- The patient is well appearing, alert and oriented x 3 today.   Head- normocephalic, atraumatic Eyes-  Sclera clear, conjunctiva pink Ears- hearing intact Oropharynx- clear Lungs- Clear to ausculation bilaterally, normal work of breathing Heart- Regular rate and rhythm, no murmurs, rubs or gallops, PMI not laterally displaced GI- soft, NT, ND, + BS Extremities- no clubbing, cyanosis, or edema  Wt Readings from Last 3 Encounters:  09/15/17 269 lb (122 kg)  02/18/17 258 lb 9.6 oz (117.3 kg)  12/23/16 250 lb (113.4 kg)    EKG tracing ordered today is personally reviewed and shows sinus rhythm  63 bpm, PR 234 msec, QRS 112 msec, LAHB, Qtc 419 msec  Assessment and Plan:  1. Persistent atrial fibrillation Well controlled post ablation off AAD therapy Lifestyle modification encouraged Compliance with long term Silvis (given prior LAA thrombus advised)  2. SVT Adenosine sensitive Stable No change required today  3. HTN Stable No change required today  4. Hypokalemia Improved with spironolactone check labs today  5. ETOH He quit 6/18!  6. OSA Followed by Dr Ron Parker He is very pleased with current treatment!  Follow-up in AF clinic in 6 months  Thompson Grayer MD, [facility] 09/15/2017 10:22 AM

## 2017-09-15 NOTE — Patient Instructions (Signed)
Description   Continue taking 5mg  daily except 2.5mg  on Sundays and Thursdays.  Recheck INR in 4 weeks.  Call with any questions or changes.  Coumadin Clinic [phone]

## 2017-10-13 ENCOUNTER — Ambulatory Visit (INDEPENDENT_AMBULATORY_CARE_PROVIDER_SITE_OTHER): Payer: Medicare Other | Admitting: *Deleted

## 2017-10-13 DIAGNOSIS — I749 Embolism and thrombosis of unspecified artery: Secondary | ICD-10-CM

## 2017-10-13 DIAGNOSIS — Z5181 Encounter for therapeutic drug level monitoring: Secondary | ICD-10-CM | POA: Diagnosis not present

## 2017-10-13 LAB — POCT INR: INR: 2.6 (ref 2.0–3.0)

## 2017-10-13 NOTE — Patient Instructions (Signed)
Description   Continue taking 5mg  daily except 2.5mg  on Sundays and Thursdays.  Recheck INR in 6 weeks.  Call with any questions or changes.  Coumadin Clinic [phone]

## 2017-10-21 ENCOUNTER — Other Ambulatory Visit: Payer: Self-pay | Admitting: Family Medicine

## 2017-10-21 DIAGNOSIS — E782 Mixed hyperlipidemia: Secondary | ICD-10-CM

## 2017-10-21 DIAGNOSIS — G2581 Restless legs syndrome: Secondary | ICD-10-CM

## 2017-11-10 ENCOUNTER — Other Ambulatory Visit: Payer: Self-pay

## 2017-11-10 MED ORDER — IPRATROPIUM BROMIDE HFA 17 MCG/ACT IN AERS: INHALATION_SPRAY | RESPIRATORY_TRACT | 0 refills | Status: DC

## 2017-11-24 ENCOUNTER — Ambulatory Visit (INDEPENDENT_AMBULATORY_CARE_PROVIDER_SITE_OTHER): Payer: Medicare Other | Admitting: *Deleted

## 2017-11-24 DIAGNOSIS — I749 Embolism and thrombosis of unspecified artery: Secondary | ICD-10-CM | POA: Diagnosis not present

## 2017-11-24 LAB — POCT INR: INR: 3.4 — AB (ref 2.0–3.0)

## 2017-11-24 NOTE — Patient Instructions (Signed)
Description   Skip today's dose, Continue taking 5mg  daily except 2.5mg  on Sundays and Thursdays.  Recheck INR in 4 weeks.  Call with any questions or changes.  Coumadin Clinic [phone]

## 2017-11-25 ENCOUNTER — Other Ambulatory Visit: Payer: Self-pay | Admitting: Family Medicine

## 2017-11-29 ENCOUNTER — Other Ambulatory Visit: Payer: Self-pay | Admitting: Family Medicine

## 2017-12-13 ENCOUNTER — Other Ambulatory Visit: Payer: Self-pay | Admitting: Family Medicine

## 2017-12-14 ENCOUNTER — Other Ambulatory Visit: Payer: Self-pay | Admitting: Internal Medicine

## 2017-12-21 ENCOUNTER — Other Ambulatory Visit: Payer: Self-pay | Admitting: Family Medicine

## 2018-01-11 ENCOUNTER — Other Ambulatory Visit: Payer: Self-pay | Admitting: Family Medicine

## 2018-01-18 ENCOUNTER — Ambulatory Visit (INDEPENDENT_AMBULATORY_CARE_PROVIDER_SITE_OTHER): Payer: Medicare Other

## 2018-01-18 DIAGNOSIS — I749 Embolism and thrombosis of unspecified artery: Secondary | ICD-10-CM

## 2018-01-18 DIAGNOSIS — Z5181 Encounter for therapeutic drug level monitoring: Secondary | ICD-10-CM | POA: Diagnosis not present

## 2018-01-18 LAB — POCT INR: INR: 2.7 (ref 2.0–3.0)

## 2018-01-18 NOTE — Patient Instructions (Signed)
Please continue taking 5mg  daily except 2.5mg  on Sundays and Thursdays.  Recheck INR in 5 weeks.  Call with any questions or changes.  Coumadin Clinic [phone]

## 2018-02-11 DIAGNOSIS — M25561 Pain in right knee: Secondary | ICD-10-CM | POA: Diagnosis not present

## 2018-02-11 DIAGNOSIS — M1711 Unilateral primary osteoarthritis, right knee: Secondary | ICD-10-CM | POA: Diagnosis not present

## 2018-02-15 ENCOUNTER — Telehealth: Payer: Self-pay

## 2018-02-15 NOTE — Telephone Encounter (Signed)
   [facility] Pre-operative Risk Assessment    Request for surgical clearance:  1. What type of surgery is being performed? Right total knee replacement   2. When is this surgery scheduled? 03/31/18   3. What type of clearance is required (medical clearance vs. Pharmacy clearance to hold med vs. Both)? Pharmacy  4. Are there any medications that need to be held prior to surgery and how long? Coumadin 5-7 days prior to surgery   5. Practice name and name of physician performing surgery? [facility]/Dr. Paralee Cancel   6. What is your office phone number [phone]    7.   What is your office fax number [phone]  8.   Anesthesia type (None, local, MAC, general) ? Spinal   Mady Haagensen 02/15/2018, 11:07 AM  _________________________________________________________________   (provider comments below)

## 2018-02-15 NOTE — Telephone Encounter (Signed)
Need coumadin managed prior to surgery

## 2018-02-16 NOTE — Telephone Encounter (Signed)
   Primary Cardiologist: Dr. Rayann Heman  Chart reviewed as part of pre-operative protocol coverage. Patient was contacted 02/16/2018 in reference to pre-operative risk assessment for pending surgery as outlined below.  Shaylon B Torelli Jr. was last seen on 09/15/2017 by Dr. Rayann Heman.  Since that day, Sagan B Remiel Corti. has done well without significant chest pain or shortness of breath.  Therefore, based on ACC/AHA guidelines, the patient would be at acceptable risk for the planned procedure without further cardiovascular testing.   I will route this recommendation to the requesting party via Epic fax function and remove from pre-op pool.  Please call with questions. Coumadin clinic will coordinate Lovenox Bridging while he holds his coumadin for 5 days prior to his upcoming surgery.   [place], [place] 02/16/2018, 5:04 PM

## 2018-02-16 NOTE — Telephone Encounter (Signed)
Patient with diagnosis of afib on warfarin for anticoagulation.    Procedure: Right total knee replacement Date of procedure: 03/31/2018  CHADS2-VASc score of  4 (CHF, HTN, AGE, DM2, stroke/tia x 2 (PE), CAD, AGE, male) Patient also has a hx of thrombus of the left atrial appendage  CrCl 137ml/min Platelet count 172  Per office protocol, patient can hold warfarin for 5 days prior to procedure.   Patient will need bridging with Lovenox (enoxaparin) around procedure. We will coordinate this at patients next coumadin clinic appointment  For orthopedic procedures please be sure to resume therapeutic (not prophylactic) dosing of lovenox post procedure.

## 2018-02-22 ENCOUNTER — Ambulatory Visit (INDEPENDENT_AMBULATORY_CARE_PROVIDER_SITE_OTHER): Payer: Medicare Other | Admitting: Pharmacist

## 2018-02-22 DIAGNOSIS — Z5181 Encounter for therapeutic drug level monitoring: Secondary | ICD-10-CM | POA: Diagnosis not present

## 2018-02-22 DIAGNOSIS — I749 Embolism and thrombosis of unspecified artery: Secondary | ICD-10-CM | POA: Diagnosis not present

## 2018-02-22 LAB — POCT INR: INR: 2.3 (ref 2.0–3.0)

## 2018-02-22 NOTE — Patient Instructions (Signed)
Please continue taking 5mg  daily except 2.5mg  on Sundays and Thursdays.  Recheck INR in 4 weeks.  Call with any questions or changes.  Coumadin Clinic [phone].

## 2018-02-27 ENCOUNTER — Other Ambulatory Visit: Payer: Self-pay | Admitting: Internal Medicine

## 2018-03-01 DIAGNOSIS — M1711 Unilateral primary osteoarthritis, right knee: Secondary | ICD-10-CM | POA: Diagnosis not present

## 2018-03-09 ENCOUNTER — Other Ambulatory Visit: Payer: Self-pay

## 2018-03-09 ENCOUNTER — Encounter: Payer: Self-pay | Admitting: Family Medicine

## 2018-03-09 ENCOUNTER — Ambulatory Visit (INDEPENDENT_AMBULATORY_CARE_PROVIDER_SITE_OTHER): Payer: Medicare Other | Admitting: Family Medicine

## 2018-03-09 VITALS — BP 98/62 | HR 69 | Temp 98.3°F | Ht 75.0 in | Wt 273.6 lb

## 2018-03-09 DIAGNOSIS — R7303 Prediabetes: Secondary | ICD-10-CM

## 2018-03-09 DIAGNOSIS — Z01818 Encounter for other preprocedural examination: Secondary | ICD-10-CM | POA: Diagnosis not present

## 2018-03-09 DIAGNOSIS — Z23 Encounter for immunization: Secondary | ICD-10-CM

## 2018-03-09 LAB — POCT URINALYSIS DIP (MANUAL ENTRY)
Bilirubin, UA: NEGATIVE
Glucose, UA: NEGATIVE mg/dL
Ketones, POC UA: NEGATIVE mg/dL
Nitrite, UA: NEGATIVE
Protein Ur, POC: NEGATIVE mg/dL
Spec Grav, UA: 1.015 (ref 1.010–1.025)
Urobilinogen, UA: 1 E.U./dL
pH, UA: 6.5 (ref 5.0–8.0)

## 2018-03-09 LAB — POCT UA - MICROSCOPIC ONLY

## 2018-03-09 LAB — POCT GLYCOSYLATED HEMOGLOBIN (HGB A1C): Hemoglobin A1C: 5.4 % (ref 4.0–5.6)

## 2018-03-09 NOTE — Patient Instructions (Signed)
Thank you for coming in to see [place] today. Please see below to review our plan for today's visit.  I will call you within 48 hours if there are any abnormalities to your lab results, otherwise you should receive results in the mail.  Follow-up with your cardiologist.  I will fax over the visit note and paper you provided me once the labs are resulted.  I wish you the best of luck with your recovery!  Please call the clinic at [phone] if your symptoms worsen or you have any concerns. It was our pleasure to serve you.  Harriet Butte, [place], PGY-3

## 2018-03-09 NOTE — Progress Notes (Signed)
   Subjective:   Patient ID: Brandon [name].    DOB: May 18, 1950, 68 y.o. male   MRN: [id number]  Brandon [name]. is a 68 y.o. male who is here for preoperative clearance for total right knee arthroplasty scheduled on 03/31/2018.  High risk cardiac conditions: - Recent MI: No. - Decompensated Heart Failure: No. - Unstable angina: No. - Symptomatic arrhythmia: No. - Sx Valvular Disease: No.  Intermediate risk factors: Yes.  OSA (treated), HTN (controlled), h/o PE, a-fib (controlled)  Functional status (>4 METS): Yes.   - Duke Activity Status Index: 0  Surgery specific risk: intermediate   Further non-invasive evaluation: - EKG: has appt with cardiologist 03/10/2018 and will receive EKG - Echo: No.   - Stress Testing: No.   Need for medical therapy? Yes.   Coreg and Lipitor. Plans to hold Warfarin 5 days prior to surgery.  Complete ROS performed, see HPI for pertinent.  Objective:   BP 98/62   Pulse 69   Temp 98.3 F (36.8 C) (Oral)   Ht 6\' 3"  (1.905 m)   Wt 273 lb 9.6 oz (124.1 kg)   SpO2 95%   BMI 34.20 kg/m  Vitals and nursing note reviewed.  General: well nourished, well developed, NAD with non-toxic appearance HEENT: normocephalic, atraumatic, moist mucous membranes Neck: supple, non-tender without lymphadenopathy Cardiovascular: regular rate and rhythm without murmurs, rubs, or gallops Lungs: clear to auscultation bilaterally with normal work of breathing Abdomen: soft, non-tender, non-distended, normoactive bowel sounds Skin: warm, dry, no rashes or lesions, cap refill < 2 seconds Extremities: warm and well perfused, normal tone, no edema, 5/5 motor strength on all 4 extremities, sensation intact throughout  Assessment & Plan:    Orders Placed This Encounter  Procedures  . Flu Vaccine QUAD 36+ mos IM  . CBC  . Comprehensive metabolic panel    Order Specific Question:   Has the patient fasted?    Answer:   No  . POCT urinalysis dipstick  . HgB A1c  . POCT  UA - Microscopic Only   No orders of the defined types were placed in this encounter.   I have independently evaluated patient. Patient is low risk risk for an intermediate risk surgery. There are not modifiable risk factors (pt quit smoking and drinking already). RCRI/NSQIP calculation for MACE is: score 9, 3.9% 30-day risk of death, MI, cardiac arrest  Pacen plans to continue statin and beta-blocker through surgery while holding warfarin based on cardiology's recommendations.  Patient will follow-up with cardiology on 03/10/2018 and will receive EKG and PT/INR level  Harriet Butte, [place], PGY-3 03/09/2018, 1:58 PM

## 2018-03-10 ENCOUNTER — Ambulatory Visit (HOSPITAL_COMMUNITY)
Admission: RE | Admit: 2018-03-10 | Discharge: 2018-03-10 | Disposition: A | Discharge status: Home / Self Care | Payer: Medicare Other | Source: Ambulatory Visit | Attending: Nurse Practitioner | Admitting: Nurse Practitioner

## 2018-03-10 ENCOUNTER — Other Ambulatory Visit: Payer: Self-pay

## 2018-03-10 ENCOUNTER — Encounter (HOSPITAL_COMMUNITY): Payer: Self-pay | Admitting: Nurse Practitioner

## 2018-03-10 VITALS — BP 106/64 | HR 70 | Ht 75.0 in | Wt 277.0 lb

## 2018-03-10 DIAGNOSIS — I10 Essential (primary) hypertension: Secondary | ICD-10-CM | POA: Diagnosis not present

## 2018-03-10 DIAGNOSIS — Z8249 Family history of ischemic heart disease and other diseases of the circulatory system: Secondary | ICD-10-CM | POA: Insufficient documentation

## 2018-03-10 DIAGNOSIS — Z87891 Personal history of nicotine dependence: Secondary | ICD-10-CM | POA: Diagnosis not present

## 2018-03-10 DIAGNOSIS — I340 Nonrheumatic mitral (valve) insufficiency: Secondary | ICD-10-CM | POA: Insufficient documentation

## 2018-03-10 DIAGNOSIS — Z7901 Long term (current) use of anticoagulants: Secondary | ICD-10-CM | POA: Insufficient documentation

## 2018-03-10 DIAGNOSIS — E785 Hyperlipidemia, unspecified: Secondary | ICD-10-CM | POA: Insufficient documentation

## 2018-03-10 DIAGNOSIS — Z9119 Patient's noncompliance with other medical treatment and regimen: Secondary | ICD-10-CM | POA: Diagnosis not present

## 2018-03-10 DIAGNOSIS — Z833 Family history of diabetes mellitus: Secondary | ICD-10-CM | POA: Diagnosis not present

## 2018-03-10 DIAGNOSIS — Z79899 Other long term (current) drug therapy: Secondary | ICD-10-CM | POA: Insufficient documentation

## 2018-03-10 DIAGNOSIS — I480 Paroxysmal atrial fibrillation: Secondary | ICD-10-CM | POA: Insufficient documentation

## 2018-03-10 DIAGNOSIS — G2581 Restless legs syndrome: Secondary | ICD-10-CM | POA: Insufficient documentation

## 2018-03-10 DIAGNOSIS — Z808 Family history of malignant neoplasm of other organs or systems: Secondary | ICD-10-CM | POA: Diagnosis not present

## 2018-03-10 DIAGNOSIS — F339 Major depressive disorder, recurrent, unspecified: Secondary | ICD-10-CM | POA: Diagnosis not present

## 2018-03-10 DIAGNOSIS — Z6834 Body mass index (BMI) 34.0-34.9, adult: Secondary | ICD-10-CM | POA: Insufficient documentation

## 2018-03-10 DIAGNOSIS — Z888 Allergy status to other drugs, medicaments and biological substances status: Secondary | ICD-10-CM | POA: Insufficient documentation

## 2018-03-10 DIAGNOSIS — Z8041 Family history of malignant neoplasm of ovary: Secondary | ICD-10-CM | POA: Insufficient documentation

## 2018-03-10 DIAGNOSIS — F419 Anxiety disorder, unspecified: Secondary | ICD-10-CM | POA: Diagnosis not present

## 2018-03-10 DIAGNOSIS — N401 Enlarged prostate with lower urinary tract symptoms: Secondary | ICD-10-CM | POA: Insufficient documentation

## 2018-03-10 DIAGNOSIS — Z86711 Personal history of pulmonary embolism: Secondary | ICD-10-CM | POA: Insufficient documentation

## 2018-03-10 DIAGNOSIS — E669 Obesity, unspecified: Secondary | ICD-10-CM | POA: Diagnosis not present

## 2018-03-10 DIAGNOSIS — G4733 Obstructive sleep apnea (adult) (pediatric): Secondary | ICD-10-CM | POA: Diagnosis not present

## 2018-03-10 LAB — COMPREHENSIVE METABOLIC PANEL
ALT: 22 IU/L (ref 0–44)
AST: 18 IU/L (ref 0–40)
Albumin/Globulin Ratio: 2 (ref 1.2–2.2)
Albumin: 4.5 g/dL (ref 3.8–4.8)
Alkaline Phosphatase: 86 IU/L (ref 39–117)
BUN/Creatinine Ratio: 15 (ref 10–24)
BUN: 13 mg/dL (ref 8–27)
Bilirubin Total: 0.4 mg/dL (ref 0.0–1.2)
CO2: 23 mmol/L (ref 20–29)
Calcium: 9.5 mg/dL (ref 8.6–10.2)
Chloride: 102 mmol/L (ref 96–106)
Creatinine, Ser: 0.89 mg/dL (ref 0.76–1.27)
GFR calc Af Amer: 101 mL/min/{1.73_m2} (ref >59)
GFR calc non Af Amer: 87 mL/min/{1.73_m2} (ref >59)
Globulin, Total: 2.2 g/dL (ref 1.5–4.5)
Glucose: 84 mg/dL (ref 65–99)
Potassium: 4.3 mmol/L (ref 3.5–5.2)
Sodium: 140 mmol/L (ref 134–144)
Total Protein: 6.7 g/dL (ref 6.0–8.5)

## 2018-03-10 LAB — CBC
Hematocrit: 41 % (ref 37.5–51.0)
Hemoglobin: 14 g/dL (ref 13.0–17.7)
MCH: 30.2 pg (ref 26.6–33.0)
MCHC: 34.1 g/dL (ref 31.5–35.7)
MCV: 88 fL (ref 79–97)
Platelets: 190 10*3/uL (ref 150–450)
RBC: 4.64 x10E6/uL (ref 4.14–5.80)
RDW: 13.7 % (ref 11.6–15.4)
WBC: 7.7 10*3/uL (ref 3.4–10.8)

## 2018-03-10 NOTE — Progress Notes (Signed)
Patient ID: Brandon Patricia., male   DOB: [birth year], 68 y.o.   MRN: [id number]     Primary Care Physician: [place] Bing, DO Referring Physician: Dr. Raynaldo Opitz Schnurr [name] is a 68 y.o. male with a h/o PAF with prior ablation in 2017 and previously treated with amiodarone. He was seen by Dr. Rayann Heman in the fall and was in [place]. He is now in the afib clinic for f/u.He continues in SR. He is pending a knee replacement 3/26.   Today, he denies symptoms of palpitations, chest pain, shortness of breath, orthopnea, PND, lower extremity edema, dizziness, presyncope, syncope, or neurologic sequela. The patient is tolerating medications without difficulties and is otherwise without complaint today.   Past Medical History:  Diagnosis Date  . Anxiety   . Arthritis   . Benign neoplasm of colon   . Benign prostatic hyperplasia with urinary obstruction   . Bilateral pulmonary embolism ([place]) 3/14   admitted to [facility],  treated with Xarelto  . Cataract   . Chronic pain   . History of kidney stones   . History of pulmonary embolism 03/19/2012  . Hyperlipemia   . Hypertension   . Insomnia   . Major depressive disorder, recurrent episode ([place])   . Mitral regurgitation 04/24/2013   Mild by TEE  . Obesity   . OSA (obstructive sleep apnea)    noncompliant with CPAP.  07/27/13- awaiting a CPAP- unable to tolerate mask  . Osteoporosis   . Persistent atrial fibrillation   . Restless leg syndrome    takes gabapentin  . Sciatica   . SVT (supraventricular tachycardia) ([place]) 03/07/2016  . Thrombus of left atrial appendage 03/09/2013  . Ventral hernia, unspecified, without mention of obstruction or gangrene    right abdominal wall   Past Surgical History:  Procedure Laterality Date  . CARDIOVERSION N/A 03/21/2012   Procedure: CARDIOVERSION;  Surgeon: Birdie Riddle, MD;  Location: [facility] ENDOSCOPY;  Service: Cardiovascular;  Laterality: N/A;  . CARDIOVERSION N/A 04/24/2013   Procedure: CARDIOVERSION;   Surgeon: Dorothy Spark, MD;  Location: [facility] ENDOSCOPY;  Service: Cardiovascular;  Laterality: N/A;  . CARDIOVERSION N/A 06/27/2015   Procedure: CARDIOVERSION;  Surgeon: Larey Dresser, MD;  Location: [place];  Service: Cardiovascular;  Laterality: N/A;  . CATARACT EXTRACTION    . COLONOSCOPY W/ POLYPECTOMY    . ELECTROPHYSIOLOGIC STUDY N/A 07/30/2015   Procedure: Atrial Fibrillation Ablation;  Surgeon: Thompson Grayer, MD;  Location: [place] CV LAB;  Service: Cardiovascular;  Laterality: N/A;  . EXTRACORPOREAL SHOCK WAVE LITHOTRIPSY Right 10/29/2016   Procedure: RIGHT EXTRACORPOREAL SHOCK WAVE LITHOTRIPSY (ESWL);  Surgeon: Alexis Frock, MD;  [facility];  Service: Urology;  Laterality: Right;  . EYE SURGERY Right    cataract  . FEMUR FRACTURE SURGERY    . HERNIA REPAIR  07/06/2011  . INGUINAL HERNIA REPAIR Right 07/28/2013   Procedure: RIGHT INGUINAL HERNIA REPAIR;  Surgeon: Imogene Burn. Georgette Dover, MD;  Location: [place];  Service: General;  Laterality: Right;  . INGUINAL HERNIA REPAIR Right 09/22/2016   Procedure: LAPAROSCOPIC RIGHT INGUINAL HERNIA;  Surgeon: Kinsinger, Arta Bruce, MD;  [facility];  Service: General;  Laterality: Right;  With MESH  . INSERTION OF MESH Right 07/28/2013   Procedure: INSERTION OF MESH;  Surgeon: Imogene Burn. Georgette Dover, MD;  Location: [place];  Service: General;  Laterality: Right;  . KNEE ARTHROSCOPY     left  . REPLACEMENT TOTAL KNEE Left   .  ROTATOR CUFF REPAIR     right  . ROTATOR CUFF REPAIR Left 03/08/2014   DR SUPPLE  . SHOULDER ARTHROSCOPY WITH ROTATOR CUFF REPAIR AND SUBACROMIAL DECOMPRESSION Left 03/08/2014   Procedure: LEFT SHOULDER ARTHROSCOPY WITH ROTATOR CUFF REPAIR/SUBACROMIAL DECOMPRESSION/DISTAL CLAVICLE RESECTION;  Surgeon: Marin Shutter, MD;  Location: [place];  Service: Orthopedics;  Laterality: Left;  . TEE WITHOUT CARDIOVERSION N/A 03/21/2012   Procedure: TRANSESOPHAGEAL ECHOCARDIOGRAM (TEE);  Surgeon: Birdie Riddle, MD;  Location: [place];  Service: Cardiovascular;  Laterality: N/A;  . TEE WITHOUT CARDIOVERSION N/A 04/24/2013   Procedure: TRANSESOPHAGEAL ECHOCARDIOGRAM (TEE);  Surgeon: Dorothy Spark, MD;  Location: [place];  Service: Cardiovascular;  Laterality: N/A;  . TEE WITHOUT CARDIOVERSION N/A 07/29/2015   Procedure: TRANSESOPHAGEAL ECHOCARDIOGRAM (TEE);  Surgeon: Fay Records, MD;  Location: [facility] ENDOSCOPY;  Service: Cardiovascular;  Laterality: N/A;  . TONSILLECTOMY      Current Outpatient Medications  Medication Sig Dispense Refill  . amLODipine (NORVASC) 5 MG tablet TAKE 1 TABLET BY MOUTH EVERY DAY 90 tablet 3  . atorvastatin (LIPITOR) 40 MG tablet TAKE 1 TABLET BY MOUTH EVERY DAY 90 tablet 3  . carvedilol (COREG) 25 MG tablet TAKE 1 TABLET (25 MG TOTAL) BY MOUTH 2 (TWO) TIMES DAILY WITH A MEAL. 180 tablet 3  . escitalopram (LEXAPRO) 20 MG tablet Take 1 tablet (20 mg total) by mouth daily. 90 tablet 3  . gabapentin (NEURONTIN) 800 MG tablet TAKE 1 TABLET (800 MG TOTAL) BY MOUTH 2 (TWO) TIMES DAILY. 180 tablet 3  . losartan (COZAAR) 100 MG tablet TAKE 1 TABLET BY MOUTH EVERY DAY 90 tablet 3  . spironolactone (ALDACTONE) 25 MG tablet Take 1 tablet (25 mg total) by mouth daily. 90 tablet 3  . tamsulosin (FLOMAX) 0.4 MG CAPS capsule TAKE 1 CAPSULE (0.4 MG TOTAL) BY MOUTH 2 (TWO) TIMES DAILY. 180 capsule 3  . warfarin (COUMADIN) 5 MG tablet TAKE 1 TABLET AS DIRECTED BY COUMADIN CLINIC 100 tablet 1  . acetaminophen (TYLENOL) 325 MG tablet You can take 2 tablets every 4 hours as need and alternate with the prescribed pain medicine, which also has Tylenol.    DO NOT TAKE MORE THAN 4000 MG OF TYLENOL PER DAY.  IT CAN HARM YOUR LIVER.  TYLENOL (ACETAMINOPHEN) IS ALSO IN YOUR PRESCRIPTION PAIN MEDICATION.  YOU HAVE TO COUNT IT IN YOUR DAILY TOTAL. (Patient not taking: Reported on 03/10/2018)     No current facility-administered medications for this encounter.     Allergies  Allergen Reactions  . Ace Inhibitors  Cough  . Albuterol Other (See Comments)    Racing heart    Social History   Socioeconomic History  . Marital status: Married    Spouse name: Not on file  . Number of children: Not on file  . Years of education: Not on file  . Highest education level: Not on file  Occupational History  . Not on file  Social Needs  . Financial resource strain: Not on file  . Food insecurity:    Worry: Not on file    Inability: Not on file  . Transportation needs:    Medical: Not on file    Non-medical: Not on file  Tobacco Use  . Smoking status: Former Smoker    Packs/day: 0.50    Years: 5.00    Pack years: 2.50    Types: Cigarettes  . Smokeless tobacco: Never Used  Substance and Sexual Activity  . Alcohol use: No  Comment: Former EtOH abuse, stopped 06/2016  . Drug use: No    Comment: negative hx for IV drug abuse  . Sexual activity: Not on file  Lifestyle  . Physical activity:    Days per week: Not on file    Minutes per session: Not on file  . Stress: Not on file  Relationships  . Social connections:    Talks on phone: Not on file    Gets together: Not on file    Attends religious service: Not on file    Active member of club or organization: Not on file    Attends meetings of clubs or organizations: Not on file    Relationship status: Not on file  . Intimate partner violence:    Fear of current or ex partner: Not on file    Emotionally abused: Not on file    Physically abused: Not on file    Forced sexual activity: Not on file  Other Topics Concern  . Not on file  Social History Narrative   Lives with wife in [place]   Retired Dietitian   Wife lives in town (still married but lives separate)   Has biological daughter in [place] ([place])    Family History  Problem Relation Age of Onset  . Cancer Father        bone  . Heart failure Mother   . Hypertension Mother   . Asthma Mother   . Cancer Paternal Grandmother        ovarian  . CVA Other         Fam Hx of multiple myeloma  . Diabetes Other        Fam Hx of DM    ROS- All systems are reviewed and negative except as per the HPI above  Physical Exam: Vitals:   03/10/18 0938  Weight: 125.6 kg  Height: _0  (1.905 m)    GEN- The patient is well appearing, alert and oriented x 3 today.   Head- normocephalic, atraumatic Eyes-  Sclera clear, conjunctiva pink Ears- hearing intact Oropharynx- clear Neck- supple, no JVP Lymph- no cervical lymphadenopathy Lungs- Clear to ausculation bilaterally, normal work of breathing Heart- regular rate and rhythm, no murmurs, rubs or gallops, PMI not laterally displaced GI- soft, NT, ND, + BS Extremities- no clubbing, cyanosis, or edema MS- no significant deformity or atrophy Skin- no rash or lesion Psych- euthymic mood, full affect Neuro- strength and sensation are intact  EKG- SR at 70 bpm, first degree AV block, pr int 210 ms, qrs int 98 ms, qtc 421 ms Epic records reviewed  Assessment and Plan: 1.PAF S/p ablation  Maintaining  SR Continue warfarin, plans are for Lovenox during his knee replacement while off warfarin Continue amiodarone /carvedilol Encouraged to return to normal activities  2. HTN Stable   F/u here in 6 months   Butch Penny C. Shealeigh Dunstan, [facility] [address] [place], [place] [address] [phone]
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
## 2018-03-18 ENCOUNTER — Other Ambulatory Visit: Payer: Self-pay | Admitting: Internal Medicine

## 2018-03-23 ENCOUNTER — Other Ambulatory Visit: Payer: Self-pay

## 2018-03-23 ENCOUNTER — Ambulatory Visit (INDEPENDENT_AMBULATORY_CARE_PROVIDER_SITE_OTHER): Payer: Medicare Other | Admitting: Pharmacist

## 2018-03-23 DIAGNOSIS — I749 Embolism and thrombosis of unspecified artery: Secondary | ICD-10-CM

## 2018-03-23 LAB — POCT INR: INR: 1.9 — AB (ref 2.0–3.0)

## 2018-03-23 MED ORDER — ENOXAPARIN SODIUM 120 MG/0.8ML ~~LOC~~ SOLN: 120.0000 mg | Freq: Two times a day (BID) | SUBCUTANEOUS | 1 refills | Status: DC

## 2018-03-23 NOTE — Patient Instructions (Addendum)
Take an extra 1/2 tablet today, then continue taking 1 tablet daily except 1/2 tablet on Sundays and Thursdays. Last dose of warfarin on Friday before your procedure, then follow separate Lovenox instructions. Recheck INR on 3/31.  Call if your procedure is canceled [phone].   3/20: Last dose of Coumadin.  3/21: No Coumadin or Lovenox.  3/22: Inject Lovenox 120mg  in the fatty abdominal tissue at least 2 inches from the belly button twice a day about 12 hours apart, 8am and 8pm rotate sites. No Coumadin.  3/23: Inject Lovenox in the fatty tissue every 12 hours, 8am and 8pm. No Coumadin.  3/24: Inject Lovenox in the fatty tissue every 12 hours, 8am and 8pm. No Coumadin.  3/25: Inject Lovenox in the fatty tissue in the morning at 8 am (No PM dose). No Coumadin.  3/26: Procedure Day - No Lovenox - Resume Coumadin in the evening or as directed by doctor (take an extra half tablet with usual dose for 2 days then resume normal dose).  3/27: Resume Lovenox inject in the fatty tissue every 12 hours and take Coumadin.  3/28: Inject Lovenox in the fatty tissue every 12 hours and take Coumadin.  3/29: Inject Lovenox in the fatty tissue every 12 hours and take Coumadin.  3/30: Inject Lovenox in the fatty tissue every 12 hours and take Coumadin.  3/31: Coumadin appt to check INR.

## 2018-03-25 ENCOUNTER — Inpatient Hospital Stay (HOSPITAL_COMMUNITY): Admission: RE | Admit: 2018-03-25 | Payer: Medicare Other | Source: Ambulatory Visit

## 2018-03-29 ENCOUNTER — Other Ambulatory Visit: Payer: Self-pay | Admitting: Family Medicine

## 2018-03-29 DIAGNOSIS — F331 Major depressive disorder, recurrent, moderate: Secondary | ICD-10-CM

## 2018-03-30 ENCOUNTER — Telehealth: Payer: Self-pay | Admitting: Pharmacist

## 2018-03-30 NOTE — Telephone Encounter (Signed)

## 2018-03-30 NOTE — Telephone Encounter (Signed)
Discussed switching to DOAC with pt - he previously had a mural thrombus in 2015 which resolved. He now takes warfarin for afib but needs Lovenox bridging around procedure times due to hx of mural thrombus. Eliquis would be indicated for pt as his current indication for anticoagulation is NVAF. DOAC is affordable since pt has Pharmacist, community and can use $10/month copay card. This will also decrease need for Lovenox bridging for future appointments. Scheduled pt for INR check on Friday and will switch him to Eliquis then.

## 2018-03-31 ENCOUNTER — Inpatient Hospital Stay (HOSPITAL_COMMUNITY): Admission: RE | Admit: 2018-03-31 | Payer: Medicare Other | Source: Home / Self Care | Admitting: Orthopedic Surgery

## 2018-03-31 ENCOUNTER — Encounter (HOSPITAL_COMMUNITY): Admission: RE | Payer: Self-pay | Source: Home / Self Care

## 2018-03-31 SURGERY — ARTHROPLASTY, KNEE, TOTAL
Anesthesia: Spinal | Laterality: Right

## 2018-04-01 ENCOUNTER — Ambulatory Visit (INDEPENDENT_AMBULATORY_CARE_PROVIDER_SITE_OTHER): Payer: Medicare Other | Admitting: Pharmacist

## 2018-04-01 DIAGNOSIS — I4891 Unspecified atrial fibrillation: Secondary | ICD-10-CM | POA: Diagnosis not present

## 2018-04-01 DIAGNOSIS — Z7901 Long term (current) use of anticoagulants: Secondary | ICD-10-CM | POA: Diagnosis not present

## 2018-04-01 DIAGNOSIS — I749 Embolism and thrombosis of unspecified artery: Secondary | ICD-10-CM | POA: Diagnosis not present

## 2018-04-01 LAB — POCT INR: INR: 2.1 (ref 2.0–3.0)

## 2018-04-01 MED ORDER — APIXABAN 5 MG PO TABS: 5.0000 mg | ORAL_TABLET | Freq: Two times a day (BID) | ORAL | 1 refills | Status: DC

## 2018-04-01 NOTE — Progress Notes (Signed)
Pt was started on Eliquis for AFib on 04/01/2018  Reviewed patients medication list.  Pt is not currently on any combined P-gp and strong CYP3A4 inhibitors/inducers (ketoconazole, traconazole, ritonavir, carbamazepine, phenytoin, rifampin, St. John's wort).  Reviewed labs.  SCr 0.89, Weight 125.6kg . Eliquis 5mg  twice daily is appropriate based on age, weight, and SCr.  Hgb = 14.0 and HCT = 41.0  A full discussion of the nature of anticoagulants has been carried out.  A benefit/risk analysis has been presented to the patient, so that they understand the justification for choosing anticoagulation with Eliquis at this time.  The need for compliance is stressed.  Pt is aware to take the medication twice daily.  Side effects of potential bleeding are discussed, including unusual colored urine or stools, coughing up blood or coffee ground emesis, nose bleeds or serious fall or head trauma.  Discussed signs and symptoms of stroke.   Call if any signs of abnormal bleeding.  Discussed financial obligations and resolved any difficulty in obtaining medication.  Next lab test in 6 months.

## 2018-04-03 ENCOUNTER — Other Ambulatory Visit: Payer: Self-pay | Admitting: Internal Medicine

## 2018-04-04 NOTE — Telephone Encounter (Signed)
Warfarin refill received; pt was switched to Eliquis on 04/01/2018. Warfarin refill denied at this time.

## 2018-04-08 ENCOUNTER — Telehealth: Payer: Self-pay | Admitting: Pharmacist

## 2018-04-08 NOTE — Telephone Encounter (Signed)
Patient called back. He wants an appointment with coumadin clinic. Not taking eliquis.   F/u appointment give as requested. LMOM to call back if need to change time

## 2018-04-12 NOTE — Telephone Encounter (Signed)
Spoke with patient and activated Eliquis copay card at his pharmacy. He is willing to transition to Eliquis now after follow up discussion. Copay will be $10/month, confirmed at pharmacy.  Pt will stop taking his warfarin (had not taken dose yet today) and will start Eliquis 5mg  twice daily - first dose tomorrow AM. Canceled upcoming INR check next month.

## 2018-04-22 ENCOUNTER — Other Ambulatory Visit: Payer: Medicare Other

## 2018-05-11 NOTE — H&P (Signed)
TOTAL KNEE ADMISSION H&P  Patient is being admitted for right total knee arthroplasty.  Subjective:  Chief Complaint:   Right knee primary OA / pain  HPI: Brandon [name]., 68 y.o. male, has a history of pain and functional disability in the right knee due to arthritis and has failed non-surgical conservative treatments for greater than 12 weeks to include NSAID's and/or analgesics, corticosteriod injections, use of assistive devices and activity modification.  Onset of symptoms was gradual, starting 1+ years ago with gradually worsening course since that time. The patient noted no past surgery on the right knee(s).  Patient currently rates pain in the right knee(s) at 8 out of 10 with activity. Patient has night pain, worsening of pain with activity and weight bearing, pain that interferes with activities of daily living, pain with passive range of motion, crepitus and joint swelling.  Patient has evidence of periarticular osteophytes and joint space narrowing by imaging studies.  There is no active infection.  Risks, benefits and expectations were discussed with the patient.  Risks including but not limited to the risk of anesthesia, blood clots, nerve damage, blood vessel damage, failure of the prosthesis, infection and up to and including death.  Patient understand the risks, benefits and expectations and wishes to proceed with surgery.   PCP: Graf Bing, DO  D/C Plans:       Home  Post-op Meds:       No Rx given   Tranexamic Acid:      To be given - IV   Decadron:      Is to be given  FYI:      Eliquis (on pre-op)  Norco  DME:   Rx given for - RW & 3-n-1  PT:   OPPT  Pharmacy:  [facility]   Patient Active Problem List   Diagnosis Date Noted  . Insomnia due to medical condition 12/23/2016  . Diverticulosis of colon without hemorrhage 12/02/2016  . BPH (benign prostatic hyperplasia) 10/21/2016  . History of kidney stones 10/21/2016  . Essential hypertension  10/21/2016  . MDD (major depressive disorder) 10/21/2016  . Obesity (BMI 30-39.9) 03/07/2016  . History of alcohol abuse 03/07/2016  . History of colon polyps 11/18/2015  . A-fib ([place]) 07/30/2015  . Paroxysmal atrial fibrillation (HCC)   . Restless legs syndrome 02/08/2014  . Generalized anxiety disorder 12/12/2013  . Recurrent unilateral inguinal hernia 07/17/2013  . Persistent atrial fibrillation 04/26/2013  . Nonrheumatic mitral valve insufficiency 04/25/2013  . Encounter for therapeutic drug monitoring 04/04/2013  . Current use of long term anticoagulation   . Obstructive sleep apnea 03/09/2013  . Back pain 03/21/2012  . HLD (hyperlipidemia) 03/21/2012  . Arthralgia of hip 03/21/2012  . History of pulmonary embolism 03/19/2012  . Bilateral inguinal hernia 05/29/2011  . Uncomplicated asthma 64/40/3474  . Anxiety state 07/18/2007   Past Medical History:  Diagnosis Date  . Anxiety   . Arthritis   . Benign neoplasm of colon   . Benign prostatic hyperplasia with urinary obstruction   . Bilateral pulmonary embolism ([place]) 3/14   admitted to [facility],  treated with Xarelto  . Cataract   . Chronic pain   . History of kidney stones   . History of pulmonary embolism 03/19/2012  . Hyperlipemia   . Hypertension   . Insomnia   . Major depressive disorder, recurrent episode ([place])   . Mitral regurgitation 04/24/2013   Mild by TEE  . Obesity   .  OSA (obstructive sleep apnea)    noncompliant with CPAP.  07/27/13- awaiting a CPAP- unable to tolerate mask  . Osteoporosis   . Persistent atrial fibrillation   . Restless leg syndrome    takes gabapentin  . Sciatica   . SVT (supraventricular tachycardia) ([place]) 03/07/2016  . Thrombus of left atrial appendage 03/09/2013  . Ventral hernia, unspecified, without mention of obstruction or gangrene    right abdominal wall    Past Surgical History:  Procedure Laterality Date  . CARDIOVERSION N/A 03/21/2012   Procedure: CARDIOVERSION;   Surgeon: Birdie Riddle, MD;  Location: [facility] ENDOSCOPY;  Service: Cardiovascular;  Laterality: N/A;  . CARDIOVERSION N/A 04/24/2013   Procedure: CARDIOVERSION;  Surgeon: Dorothy Spark, MD;  Location: [facility] ENDOSCOPY;  Service: Cardiovascular;  Laterality: N/A;  . CARDIOVERSION N/A 06/27/2015   Procedure: CARDIOVERSION;  Surgeon: Larey Dresser, MD;  Location: [place];  Service: Cardiovascular;  Laterality: N/A;  . CATARACT EXTRACTION    . COLONOSCOPY W/ POLYPECTOMY    . ELECTROPHYSIOLOGIC STUDY N/A 07/30/2015   Procedure: Atrial Fibrillation Ablation;  Surgeon: Thompson Grayer, MD;  Location: [place] CV LAB;  Service: Cardiovascular;  Laterality: N/A;  . EXTRACORPOREAL SHOCK WAVE LITHOTRIPSY Right 10/29/2016   Procedure: RIGHT EXTRACORPOREAL SHOCK WAVE LITHOTRIPSY (ESWL);  Surgeon: Alexis Frock, MD;  Location: [facility] ORS;  Service: Urology;  Laterality: Right;  . EYE SURGERY Right    cataract  . FEMUR FRACTURE SURGERY    . HERNIA REPAIR  07/06/2011  . INGUINAL HERNIA REPAIR Right 07/28/2013   Procedure: RIGHT INGUINAL HERNIA REPAIR;  Surgeon: Imogene Burn. Georgette Dover, MD;  Location: [place];  Service: General;  Laterality: Right;  . INGUINAL HERNIA REPAIR Right 09/22/2016   Procedure: LAPAROSCOPIC RIGHT INGUINAL HERNIA;  Surgeon: Kinsinger, Arta Bruce, MD;  Location: [facility];  Service: General;  Laterality: Right;  With MESH  . INSERTION OF MESH Right 07/28/2013   Procedure: INSERTION OF MESH;  Surgeon: Imogene Burn. Georgette Dover, MD;  Location: [place];  Service: General;  Laterality: Right;  . KNEE ARTHROSCOPY     left  . REPLACEMENT TOTAL KNEE Left   . ROTATOR CUFF REPAIR     right  . ROTATOR CUFF REPAIR Left 03/08/2014   DR SUPPLE  . SHOULDER ARTHROSCOPY WITH ROTATOR CUFF REPAIR AND SUBACROMIAL DECOMPRESSION Left 03/08/2014   Procedure: LEFT SHOULDER ARTHROSCOPY WITH ROTATOR CUFF REPAIR/SUBACROMIAL DECOMPRESSION/DISTAL CLAVICLE RESECTION;  Surgeon: Marin Shutter, MD;  Location: [place];  Service: Orthopedics;   Laterality: Left;  . TEE WITHOUT CARDIOVERSION N/A 03/21/2012   Procedure: TRANSESOPHAGEAL ECHOCARDIOGRAM (TEE);  Surgeon: Birdie Riddle, MD;  Location: [place];  Service: Cardiovascular;  Laterality: N/A;  . TEE WITHOUT CARDIOVERSION N/A 04/24/2013   Procedure: TRANSESOPHAGEAL ECHOCARDIOGRAM (TEE);  Surgeon: Dorothy Spark, MD;  Location: [place];  Service: Cardiovascular;  Laterality: N/A;  . TEE WITHOUT CARDIOVERSION N/A 07/29/2015   Procedure: TRANSESOPHAGEAL ECHOCARDIOGRAM (TEE);  Surgeon: Fay Records, MD;  Location: [facility] ENDOSCOPY;  Service: Cardiovascular;  Laterality: N/A;  . TONSILLECTOMY      No current facility-administered medications for this encounter.    Current Outpatient Medications  Medication Sig Dispense Refill Last Dose  . amLODipine (NORVASC) 5 MG tablet TAKE 1 TABLET BY MOUTH EVERY DAY (Patient taking differently: Take 5 mg by mouth every evening. ) 90 tablet 3 Taking  . apixaban (ELIQUIS) 5 MG TABS tablet Take 1 tablet (5 mg total) by mouth 2 (two) times daily. 60 tablet 1   . atorvastatin (  LIPITOR) 40 MG tablet TAKE 1 TABLET BY MOUTH EVERY DAY (Patient taking differently: Take 40 mg by mouth daily. ) 90 tablet 3 Taking  . carvedilol (COREG) 25 MG tablet TAKE 1 TABLET (25 MG TOTAL) BY MOUTH 2 (TWO) TIMES DAILY WITH A MEAL. 180 tablet 3 Taking  . diphenhydramine-acetaminophen (TYLENOL PM) 25-500 MG TABS tablet Take 3 tablets by mouth at bedtime as needed (sleep).     Marland Kitchen escitalopram (LEXAPRO) 20 MG tablet TAKE 1 TABLET BY MOUTH EVERY DAY 90 tablet 3   . gabapentin (NEURONTIN) 800 MG tablet TAKE 1 TABLET (800 MG TOTAL) BY MOUTH 2 (TWO) TIMES DAILY. 180 tablet 3 Taking  . losartan (COZAAR) 100 MG tablet TAKE 1 TABLET BY MOUTH EVERY DAY (Patient taking differently: Take 100 mg by mouth daily. ) 90 tablet 3 Taking  . psyllium (METAMUCIL) 58.6 % powder Take 1 packet by mouth daily.     Marland Kitchen spironolactone (ALDACTONE) 25 MG tablet TAKE 1 TABLET BY MOUTH EVERY DAY 90  tablet 1   . tamsulosin (FLOMAX) 0.4 MG CAPS capsule TAKE 1 CAPSULE (0.4 MG TOTAL) BY MOUTH 2 (TWO) TIMES DAILY. 180 capsule 3 Taking   Allergies  Allergen Reactions  . Ace Inhibitors Cough  . Albuterol Other (See Comments)    Racing heart    Social History   Tobacco Use  . Smoking status: Former Smoker    Packs/day: 0.50    Years: 5.00    Pack years: 2.50    Types: Cigarettes  . Smokeless tobacco: Never Used  Substance Use Topics  . Alcohol use: No    Comment: Former EtOH abuse, stopped 06/2016    Family History  Problem Relation Age of Onset  . Cancer Father        bone  . Heart failure Mother   . Hypertension Mother   . Asthma Mother   . Cancer Paternal Grandmother        ovarian  . CVA Other        Fam Hx of multiple myeloma  . Diabetes Other        Fam Hx of DM     Review of Systems  Constitutional: Negative.   HENT: Negative.   Eyes: Negative.   Respiratory: Negative.   Cardiovascular: Negative.   Gastrointestinal: Negative.   Genitourinary: Negative.   Musculoskeletal: Positive for back pain and joint pain.  Skin: Negative.   Neurological: Negative.   Endo/Heme/Allergies: Negative.   Psychiatric/Behavioral: Positive for depression. The patient is nervous/anxious and has insomnia.     Objective:  Physical Exam  Constitutional: He is oriented to person, place, and time. He appears well-developed.  HENT:  Head: Normocephalic.  Eyes: Pupils are equal, round, and reactive to light.  Neck: Neck supple. No JVD present. No tracheal deviation present. No thyromegaly present.  Cardiovascular: Normal rate, regular rhythm and intact distal pulses.  Respiratory: Effort normal and breath sounds normal. No respiratory distress. He has no wheezes.  GI: Soft. There is no abdominal tenderness. There is no guarding.  Musculoskeletal:     Right knee: He exhibits decreased range of motion, swelling and bony tenderness. He exhibits no ecchymosis, no deformity, no  laceration and no erythema. Tenderness found.  Lymphadenopathy:    He has no cervical adenopathy.  Neurological: He is alert and oriented to person, place, and time.  Skin: Skin is warm and dry.  Psychiatric: He has a normal mood and affect.     Labs:  Estimated body mass  index is 34.62 kg/m as calculated from the following:   Height as of 03/10/18: 6' 3"  (1.905 m).   Weight as of 03/10/18: 125.6 kg.   Imaging Review Plain radiographs demonstrate severe degenerative joint disease of the right knee.  The bone quality appears to be good for age and reported activity level.      Assessment/Plan:  End stage arthritis, right knee   The patient history, physical examination, clinical judgment of the provider and imaging studies are consistent with end stage degenerative joint disease of the right knee(s) and total knee arthroplasty is deemed medically necessary. The treatment options including medical management, injection therapy arthroscopy and arthroplasty were discussed at length. The risks and benefits of total knee arthroplasty were presented and reviewed. The risks due to aseptic loosening, infection, stiffness, patella tracking problems, thromboembolic complications and other imponderables were discussed. The patient acknowledged the explanation, agreed to proceed with the plan and consent was signed. Patient is being admitted for inpatient treatment for surgery, pain control, PT, OT, prophylactic antibiotics, VTE prophylaxis, progressive ambulation and ADL's and discharge planning. The patient is planning to be discharged home.     Patient's anticipated LOS is less than 2 midnights, meeting these requirements: - Lives within 1 hour of care - Has a competent adult at home to recover with post-op recover - NO history of  - Diabetes  - Heart failure  - Heart attack  - Stroke  - Renal failure  - Anemia  - Advanced Liver disease   [place]. Beverly Suriano   PA-C  05/11/2018, 1:18 PM

## 2018-05-13 ENCOUNTER — Other Ambulatory Visit: Payer: Self-pay

## 2018-05-13 ENCOUNTER — Other Ambulatory Visit: Payer: Medicare Other

## 2018-05-13 DIAGNOSIS — Z7901 Long term (current) use of anticoagulants: Secondary | ICD-10-CM | POA: Diagnosis not present

## 2018-05-13 LAB — BASIC METABOLIC PANEL
BUN/Creatinine Ratio: 14 (ref 10–24)
BUN: 13 mg/dL (ref 8–27)
CO2: 25 mmol/L (ref 20–29)
Calcium: 9.6 mg/dL (ref 8.6–10.2)
Chloride: 99 mmol/L (ref 96–106)
Creatinine, Ser: 0.94 mg/dL (ref 0.76–1.27)
GFR calc Af Amer: 95 mL/min/{1.73_m2} (ref >59)
GFR calc non Af Amer: 82 mL/min/{1.73_m2} (ref >59)
Glucose: 90 mg/dL (ref 65–99)
Potassium: 4.6 mmol/L (ref 3.5–5.2)
Sodium: 140 mmol/L (ref 134–144)

## 2018-05-13 LAB — CBC
Hematocrit: 38.3 % (ref 37.5–51.0)
Hemoglobin: 13.4 g/dL (ref 13.0–17.7)
MCH: 30.3 pg (ref 26.6–33.0)
MCHC: 35 g/dL (ref 31.5–35.7)
MCV: 87 fL (ref 79–97)
Platelets: 176 10*3/uL (ref 150–450)
RBC: 4.42 x10E6/uL (ref 4.14–5.80)
RDW: 12.7 % (ref 11.6–15.4)
WBC: 6.9 10*3/uL (ref 3.4–10.8)

## 2018-05-16 NOTE — Progress Notes (Signed)
  05-13-18 (Epic) CBC, BMP  03-09-18 (Epic) Surgical clearance from Dr. Yisroel Ramming  03-10-18 (Epic) EKG  09-24-16 (Epic) ECHO

## 2018-05-16 NOTE — Patient Instructions (Addendum)
Armonte Tortorella.  05/16/2018   Your procedure is scheduled on: 05-24-18    Report to [facility] Main  Entrance    Report to Short Stay  at 5:30 AM   [place] 19 TEST ON  05-20-18 , THIS TEST MUST BE DONE BEFORE SURGERY, COME TO [place] ENTRANCE BETWEEN THE HOURS OF 900 AM AND 300 PM ON YOUR COVID TEST DATE.   Call this number if you have problems the morning of surgery [phone]    Remember: NO SOLID FOOD AFTER MIDNIGHT THE NIGHT PRIOR TO SURGERY. NOTHING BY MOUTH EXCEPT CLEAR LIQUIDS UNTIL 3 HOURS PRIOR TO SCHEDULED SURGERY. PLEASE FINISH ENSURE DRINK PER SURGEON ORDER AT 4:30 AM.   CLEAR LIQUID DIET   Foods Allowed                                                                     Foods Excluded  Coffee and tea, regular and decaf                             liquids that you cannot  Plain Jell-O in any flavor                                             see through such as: Fruit ices (not with fruit pulp)                                     milk, soups, orange juice  Iced Popsicles                                    All solid food Carbonated beverages, regular and diet                                    Cranberry, grape and apple juices Sports drinks like Gatorade Lightly seasoned clear broth or consume(fat free) Sugar, honey syrup  Sample Menu Breakfast                                Lunch                                     Supper Cranberry juice                    Beef broth                            Chicken broth Jell-O  Grape juice                           Apple juice Coffee or tea                        Jell-O                                      Popsicle                                                Coffee or tea                        Coffee or tea  _____________________________________________________________________     BRUSH YOUR TEETH MORNING OF SURGERY  AND RINSE YOUR MOUTH OUT, NO CHEWING GUM CANDY OR MINTS.     Take these medicines the morning of surgery with A SIP OF WATER: Atorvastatin (Lipitor), Carvedilol (Coreg), Escitalopram (Lexapro), and Tamsulosin (Flomax)                                You may not have any metal on your body including hair pins and              piercings     Do not wear jewelry, cologne,  lotions, powders or  deodorant              Men may shave face and neck.   Do not bring valuables to the hospital. [place].  Contacts, dentures or bridgework may not be worn into surgery.    Special Instructions: N/A              Please read over the following fact sheets you were given: _____________________________________________________________________             [facility] - Preparing for Surgery Before surgery, you can play an important role.  Because skin is not sterile, your skin needs to be as free of germs as possible.  You can reduce the number of germs on your skin by washing with CHG (chlorahexidine gluconate) soap before surgery.  CHG is an antiseptic cleaner which kills germs and bonds with the skin to continue killing germs even after washing. Please DO NOT use if you have an allergy to CHG or antibacterial soaps.  If your skin becomes reddened/irritated stop using the CHG and inform your nurse when you arrive at Short Stay. Do not shave (including legs and underarms) for at least 48 hours prior to the first CHG shower.  You may shave your face/neck. Please follow these instructions carefully:  1.  Shower with CHG Soap the night before surgery and the  morning of Surgery.  2.  If you choose to wash your hair, wash your hair first as usual with your  normal  shampoo.  3.  After you shampoo, rinse your hair and body thoroughly to remove the  shampoo.  4.  Use CHG as you would any other liquid soap.  You can apply chg directly  to  the skin and wash                       Gently with a scrungie or clean washcloth.  5.  Apply the CHG Soap to your body ONLY FROM THE NECK DOWN.   Do not use on face/ open                           Wound or open sores. Avoid contact with eyes, ears mouth and genitals (private parts).                       Wash face,  Genitals (private parts) with your normal soap.             6.  Wash thoroughly, paying special attention to the area where your surgery  will be performed.  7.  Thoroughly rinse your body with warm water from the neck down.  8.  DO NOT shower/wash with your normal soap after using and rinsing off  the CHG Soap.                9.  Pat yourself dry with a clean towel.            10.  Wear clean pajamas.            11.  Place clean sheets on your bed the night of your first shower and do not  sleep with pets. Day of Surgery : Do not apply any lotions/deodorants the morning of surgery.  Please wear clean clothes to the hospital/surgery center.  FAILURE TO FOLLOW THESE INSTRUCTIONS MAY RESULT IN THE CANCELLATION OF YOUR SURGERY PATIENT SIGNATURE_________________________________  NURSE SIGNATURE__________________________________  ________________________________________________________________________   Adam Phenix  An incentive spirometer is a tool that can help keep your lungs clear and active. This tool measures how well you are filling your lungs with each breath. Taking long deep breaths may help reverse or decrease the chance of developing breathing (pulmonary) problems (especially infection) following:  A long period of time when you are unable to move or be active. BEFORE THE PROCEDURE   If the spirometer includes an indicator to show your best effort, your nurse or respiratory therapist will set it to a desired goal.  If possible, sit up straight or lean slightly forward. Try not to slouch.  Hold the incentive spirometer in an upright position. INSTRUCTIONS  FOR USE  1. Sit on the edge of your bed if possible, or sit up as far as you can in bed or on a chair. 2. Hold the incentive spirometer in an upright position. 3. Breathe out normally. 4. Place the mouthpiece in your mouth and seal your lips tightly around it. 5. Breathe in slowly and as deeply as possible, raising the piston or the ball toward the top of the column. 6. Hold your breath for 3-5 seconds or for as long as possible. Allow the piston or ball to fall to the bottom of the column. 7. Remove the mouthpiece from your mouth and breathe out normally. 8. Rest for a few seconds and repeat Steps 1 through 7 at least 10 times every 1-2 hours when you are awake. Take your time and take a few normal breaths between deep breaths. 9. The spirometer may include an indicator to  show your best effort. Use the indicator as a goal to work toward during each repetition. 10. After each set of 10 deep breaths, practice coughing to be sure your lungs are clear. If you have an incision (the cut made at the time of surgery), support your incision when coughing by placing a pillow or rolled up towels firmly against it. Once you are able to get out of bed, walk around indoors and cough well. You may stop using the incentive spirometer when instructed by your caregiver.  RISKS AND COMPLICATIONS  Take your time so you do not get dizzy or light-headed.  If you are in pain, you may need to take or ask for pain medication before doing incentive spirometry. It is harder to take a deep breath if you are having pain. AFTER USE  Rest and breathe slowly and easily.  It can be helpful to keep track of a log of your progress. Your caregiver can provide you with a simple table to help with this. If you are using the spirometer at home, follow these instructions: [place] IF:   You are having difficultly using the spirometer.  You have trouble using the spirometer as often as instructed.  Your pain  medication is not giving enough relief while using the spirometer.  You develop fever of 100.5 F (38.1 C) or higher. SEEK IMMEDIATE MEDICAL CARE IF:   You cough up bloody sputum that had not been present before.  You develop fever of 102 F (38.9 C) or greater.  You develop worsening pain at or near the incision site. MAKE SURE YOU:   Understand these instructions.  Will watch your condition.  Will get help right away if you are not doing well or get worse. Document Released: 05/04/2006 Document Revised: 03/16/2011 Document Reviewed: 07/05/2006 Exit[employer] Patient Information 2014 [employer], [place].   ________________________________________________________________________  WHAT IS A BLOOD TRANSFUSION? Blood Transfusion Information  A transfusion is the replacement of blood or some of its parts. Blood is made up of multiple cells which provide different functions.  Red blood cells carry oxygen and are used for blood loss replacement.  White blood cells fight against infection.  Platelets control bleeding.  Plasma helps clot blood.  Other blood products are available for specialized needs, such as hemophilia or other clotting disorders. BEFORE THE TRANSFUSION  Who gives blood for transfusions?   Healthy volunteers who are fully evaluated to make sure their blood is safe. This is blood bank blood. Transfusion therapy is the safest it has ever been in the practice of medicine. Before blood is taken from a donor, a complete history is taken to make sure that person has no history of diseases nor engages in risky social behavior (examples are intravenous drug use or sexual activity with multiple partners). The donor's travel history is screened to minimize risk of transmitting infections, such as malaria. The donated blood is tested for signs of infectious diseases, such as HIV and hepatitis. The blood is then tested to be sure it is compatible with you in order to minimize the  chance of a transfusion reaction. If you or a relative donates blood, this is often done in anticipation of surgery and is not appropriate for emergency situations. It takes many days to process the donated blood. RISKS AND COMPLICATIONS Although transfusion therapy is very safe and saves many lives, the main dangers of transfusion include:   Getting an infectious disease.  Developing a transfusion reaction. This is an allergic reaction  to something in the blood you were given. Every precaution is taken to prevent this. The decision to have a blood transfusion has been considered carefully by your caregiver before blood is given. Blood is not given unless the benefits outweigh the risks. AFTER THE TRANSFUSION  Right after receiving a blood transfusion, you will usually feel much better and more energetic. This is especially true if your red blood cells have gotten low (anemic). The transfusion raises the level of the red blood cells which carry oxygen, and this usually causes an energy increase.  The nurse administering the transfusion will monitor you carefully for complications. HOME CARE INSTRUCTIONS  No special instructions are needed after a transfusion. You may find your energy is better. Speak with your caregiver about any limitations on activity for underlying diseases you may have. SEEK MEDICAL CARE IF:   Your condition is not improving after your transfusion.  You develop redness or irritation at the intravenous (IV) site. SEEK IMMEDIATE MEDICAL CARE IF:  Any of the following symptoms occur over the next 12 hours:  Shaking chills.  You have a temperature by mouth above 102 F (38.9 C), not controlled by medicine.  Chest, back, or muscle pain.  People around you feel you are not acting correctly or are confused.  Shortness of breath or difficulty breathing.  Dizziness and fainting.  You get a rash or develop hives.  You have a decrease in urine output.  Your urine  turns a dark color or changes to pink, red, or brown. Any of the following symptoms occur over the next 10 days:  You have a temperature by mouth above 102 F (38.9 C), not controlled by medicine.  Shortness of breath.  Weakness after normal activity.  The white part of the eye turns yellow (jaundice).  You have a decrease in the amount of urine or are urinating less often.  Your urine turns a dark color or changes to pink, red, or brown. Document Released: 12/20/1999 Document Revised: 03/16/2011 Document Reviewed: 08/08/2007 [facility] Patient Information 2014 [place], [place].  _______________________________________________________________________

## 2018-05-16 NOTE — Progress Notes (Signed)
SPOKE W/ PT     SCREENING SYMPTOMS OF COVID 19:   COUGH--No  RUNNY NOSE--- NO  SORE THROAT---No  NASAL CONGESTION----  SNEEZING----No  SHORTNESS OF BREATH---No  DIFFICULTY BREATHING---No  TEMP >100.0 -----No  UNEXPLAINED BODY ACHES------No  CHILLS --------NO  HEADACHES ---------No  LOSS OF SMELL/ TASTE --------no    HAVE YOU OR ANY FAMILY MEMBER TRAVELLED PAST 14 DAYS OUT OF THE   COUNTY---No STATE----No COUNTRY----No  HAVE YOU OR ANY FAMILY MEMBER BEEN EXPOSED TO ANYONE WITH COVID 19? No

## 2018-05-17 ENCOUNTER — Encounter (HOSPITAL_COMMUNITY): Payer: Self-pay

## 2018-05-17 ENCOUNTER — Encounter (HOSPITAL_COMMUNITY)
Admission: RE | Admit: 2018-05-17 | Discharge: 2018-05-17 | Disposition: A | Discharge status: Home / Self Care | Payer: Medicare Other | Source: Ambulatory Visit | Attending: Orthopedic Surgery | Admitting: Orthopedic Surgery

## 2018-05-17 ENCOUNTER — Other Ambulatory Visit: Payer: Self-pay

## 2018-05-17 DIAGNOSIS — M25561 Pain in right knee: Secondary | ICD-10-CM | POA: Diagnosis not present

## 2018-05-17 DIAGNOSIS — Z1159 Encounter for screening for other viral diseases: Secondary | ICD-10-CM | POA: Insufficient documentation

## 2018-05-17 DIAGNOSIS — M1711 Unilateral primary osteoarthritis, right knee: Secondary | ICD-10-CM | POA: Diagnosis not present

## 2018-05-17 DIAGNOSIS — Z01812 Encounter for preprocedural laboratory examination: Secondary | ICD-10-CM | POA: Diagnosis not present

## 2018-05-17 LAB — SURGICAL PCR SCREEN
MRSA, PCR: NEGATIVE
Staphylococcus aureus: NEGATIVE

## 2018-05-19 ENCOUNTER — Telehealth: Payer: Self-pay | Admitting: Pharmacist

## 2018-05-19 MED ORDER — ENOXAPARIN SODIUM 120 MG/0.8ML ~~LOC~~ SOLN: 120.0000 mg | Freq: Two times a day (BID) | SUBCUTANEOUS | 0 refills | Status: DC

## 2018-05-19 NOTE — Anesthesia Preprocedure Evaluation (Addendum)
Anesthesia Evaluation  Patient identified by MRN, date of birth, ID band Patient awake    Reviewed: Allergy & Precautions, NPO status , Patient's Chart, lab work & pertinent test results  Airway Mallampati: II  TM Distance: >3 FB Neck ROM: Full    Dental no notable dental hx.    Pulmonary asthma , sleep apnea , former smoker,    Pulmonary exam normal breath sounds clear to auscultation       Cardiovascular hypertension, Normal cardiovascular exam Rhythm:Regular Rate:Normal     Neuro/Psych negative neurological ROS  negative psych ROS   GI/Hepatic negative GI ROS, Neg liver ROS,   Endo/Other  negative endocrine ROS  Renal/GU negative Renal ROS  negative genitourinary   Musculoskeletal negative musculoskeletal ROS (+)   Abdominal   Peds negative pediatric ROS (+)  Hematology negative hematology ROS (+)   Anesthesia Other Findings   Reproductive/Obstetrics negative OB ROS                            Anesthesia Physical Anesthesia Plan  ASA: III  Anesthesia Plan: General   Post-op Pain Management:    Induction: Intravenous and Rapid sequence  PONV Risk Score and Plan: 2 and Ondansetron, Dexamethasone and Treatment may vary due to age or medical condition  Airway Management Planned: Simple Face Mask  Additional Equipment:   Intra-op Plan:   Post-operative Plan: Extubation in OR  Informed Consent: I have reviewed the patients History and Physical, chart, labs and discussed the procedure including the risks, benefits and alternatives for the proposed anesthesia with the patient or authorized representative who has indicated his/her understanding and acceptance.     Dental advisory given  Plan Discussed with: CRNA and Surgeon  Anesthesia Plan Comments: (See PAT note 05/17/18, Konrad Felix, PA-C  Patient took eliquis yesterday, no SAB )     Anesthesia Quick Evaluation

## 2018-05-19 NOTE — Telephone Encounter (Signed)
Received message from Konrad Felix, [place] regarding patient's anticoagulation and upcoming TKA on 05/24/18 during which spinal anesthesia will be used. Pt recently changed from warfarin to Eliquis and will need to hold his Eliquis 3 days prior to procedure. He typically received Lovenox bridging when he held his warfarin for procedures, will use small Lovenox bridge for upcoming procedure due to history of afib, PE, and thrombus of LAA.  Weight = 130kg, will dose 1mg /kg BID  5/15 - last dose of Eliquis in PM 5/16 - Lovenox 120mg  8AM and 8PM 5/17 - Lovenox 120mg  8AM and 8PM 5/18 - Lovenox 120mg  8AM, no PM dose 5/19 - procedure, no anticoagulation 5/20 - resume Eliquis in PM or as directed by MD  Discussed instructions above with pt, repeated them twice, pt verbalized understanding and will call with concerns. Lovenox rx sent to pharmacy.

## 2018-05-19 NOTE — Progress Notes (Signed)
Anesthesia Chart Review   Case:  [id number] Date/Time:  05/24/18 0715   Procedure:  TOTAL KNEE ARTHROPLASTY (Right ) - 70 mins   Anesthesia type:  Spinal   Pre-op diagnosis:  Right knee osteoarthritis   Location:  [place] 09 / [facility]   Surgeon:  Paralee Cancel, MD      DISCUSSION: 68 yo former smoker (2.5 pack years, quit 01/05/74) with h/o anxiety, depression, A-fib (s/p ablation 2017 on Eliquis), bilateral PE 2014, hyperlipemia, HTN, BPH, OSA, right knee OA scheduled for above procedure 05/24/18 with Dr. Paralee Cancel.   COVID19 swab ordered, pending.   Pt last seen in A-fib clinic 03/10/2018.  Seen by Roderic Palau, NP.  Stable with 6 month follow up recommended.   Cardiac clearance received 02/16/18.  Per [place], [place], "Brandon [name]. was last seen on 09/15/2017 by Dr. Rayann Heman.  Since that day, Brandon [name]. has done well without significant chest pain or shortness of breath.  Therefore, based on ACC/AHA guidelines, the patient would be at acceptable risk for the planned procedure without further cardiovascular testing."  At the time patient was cleared he was on Coumadin and instructions for stopping Coumadin and starting Lovenox bridge were given.  He has since been switched to Eliquis.  Spoke with pharmacist Fuller Canada, [place].  Pt to hold Eliquis 3 days prior to procedure and will require Lovenox bridge.  She will contact patient to go over instructions.  Surgeon made aware.    Pt can proceed with planned procedure barring acute status change.  VS: BP 135/81   Pulse 65   Temp 36.9 C (Oral)   Resp 18   Ht 6\' 3"  (1.905 m)   Wt 129.5 kg   SpO2 96%   BMI 35.69 kg/m   PROVIDERS: [place], DO is PCP   Thompson Grayer, MD is Cardiologist  LABS: Labs reviewed: Acceptable for surgery. (all labs ordered are listed, but only abnormal results are displayed)  Labs Reviewed  SURGICAL PCR SCREEN  TYPE AND SCREEN     IMAGES:   EKG: 03/10/2018 Rate 70 bpm Sinus rhythm with 1st  degree AV block  Left axis deviation Since last tracing rate slower   CV: Echo 09/24/2016 Study Conclusions  - Left ventricle: The cavity size was normal. Systolic function was   normal. The estimated ejection fraction was in the range of 60%   to 65%. Wall motion was normal; there were no regional wall   motion abnormalities. Doppler parameters are consistent with   abnormal left ventricular relaxation (grade 1 diastolic   dysfunction). There was no evidence of elevated ventricular   filling pressure by Doppler parameters. - Aortic valve: Trileaflet; normal thickness leaflets. There was no   regurgitation. - Mitral valve: There was trivial regurgitation. - Right ventricle: The cavity size was normal. Wall thickness was   normal. Systolic function was normal. - Tricuspid valve: There was no regurgitation. - Pulmonary arteries: Systolic pressure could not be accurately   estimated. - Inferior vena cava: The vessel was normal in size. - Pericardium, extracardiac: There was no pericardial effusion.  Stress Test 03/12/2016  Nuclear stress EF: 53%.  There was no ST segment deviation noted during stress.  Defect 1: There is a large defect of severe severity present in the basal inferolateral, mid inferolateral and apex location.  This is a low risk study.  The left ventricular ejection fraction is mildly decreased (45-54%).   Low risk stress nuclear  study with large prior inferolateral infarct and mild peri-infarct ischemia; EF 53 with inferolateral hypokinesis; mild LVE. Past Medical History:  Diagnosis Date  . Anxiety   . Arthritis   . Benign neoplasm of colon   . Benign prostatic hyperplasia with urinary obstruction   . Bilateral pulmonary embolism ([place]) 3/14   admitted to [facility],  treated with Xarelto  . Cataract   . Chronic pain   . History of kidney stones   . History of pulmonary embolism 03/19/2012  . Hyperlipemia   . Hypertension   . Insomnia   . Major  depressive disorder, recurrent episode ([place])   . Mitral regurgitation 04/24/2013   Mild by TEE  . Obesity   . OSA (obstructive sleep apnea)    noncompliant with CPAP.  07/27/13- awaiting a CPAP- unable to tolerate mask  . Osteoporosis   . Persistent atrial fibrillation   . Restless leg syndrome    takes gabapentin  . Sciatica   . SVT (supraventricular tachycardia) ([place]) 03/07/2016  . Thrombus of left atrial appendage 03/09/2013  . Ventral hernia, unspecified, without mention of obstruction or gangrene    right abdominal wall    Past Surgical History:  Procedure Laterality Date  . CARDIOVERSION N/A 03/21/2012   Procedure: CARDIOVERSION;  Surgeon: Birdie Riddle, MD;  Location: [facility] ENDOSCOPY;  Service: Cardiovascular;  Laterality: N/A;  . CARDIOVERSION N/A 04/24/2013   Procedure: CARDIOVERSION;  Surgeon: Dorothy Spark, MD;  Location: [facility] ENDOSCOPY;  Service: Cardiovascular;  Laterality: N/A;  . CARDIOVERSION N/A 06/27/2015   Procedure: CARDIOVERSION;  Surgeon: Larey Dresser, MD;  Location: [place];  Service: Cardiovascular;  Laterality: N/A;  . CATARACT EXTRACTION    . COLONOSCOPY W/ POLYPECTOMY    . ELECTROPHYSIOLOGIC STUDY N/A 07/30/2015   Procedure: Atrial Fibrillation Ablation;  Surgeon: Thompson Grayer, MD;  Location: [place] CV LAB;  Service: Cardiovascular;  Laterality: N/A;  . EXTRACORPOREAL SHOCK WAVE LITHOTRIPSY Right 10/29/2016   Procedure: RIGHT EXTRACORPOREAL SHOCK WAVE LITHOTRIPSY (ESWL);  Surgeon: Alexis Frock, MD;  Location: [facility];  Service: Urology;  Laterality: Right;  . EYE SURGERY Right    cataract  . FEMUR FRACTURE SURGERY    . HERNIA REPAIR  07/06/2011  . INGUINAL HERNIA REPAIR Right 07/28/2013   Procedure: RIGHT INGUINAL HERNIA REPAIR;  Surgeon: Imogene Burn. Georgette Dover, MD;  Location: [place];  Service: General;  Laterality: Right;  . INGUINAL HERNIA REPAIR Right 09/22/2016   Procedure: LAPAROSCOPIC RIGHT INGUINAL HERNIA;  Surgeon: Kinsinger, Arta Bruce, MD;   Location: [facility];  Service: General;  Laterality: Right;  With MESH  . INSERTION OF MESH Right 07/28/2013   Procedure: INSERTION OF MESH;  Surgeon: Imogene Burn. Georgette Dover, MD;  Location: [place];  Service: General;  Laterality: Right;  . KNEE ARTHROSCOPY     left  . REPLACEMENT TOTAL KNEE Left   . ROTATOR CUFF REPAIR     right  . ROTATOR CUFF REPAIR Left 03/08/2014   DR SUPPLE  . SHOULDER ARTHROSCOPY WITH ROTATOR CUFF REPAIR AND SUBACROMIAL DECOMPRESSION Left 03/08/2014   Procedure: LEFT SHOULDER ARTHROSCOPY WITH ROTATOR CUFF REPAIR/SUBACROMIAL DECOMPRESSION/DISTAL CLAVICLE RESECTION;  Surgeon: Marin Shutter, MD;  Location: [place];  Service: Orthopedics;  Laterality: Left;  . TEE WITHOUT CARDIOVERSION N/A 03/21/2012   Procedure: TRANSESOPHAGEAL ECHOCARDIOGRAM (TEE);  Surgeon: Birdie Riddle, MD;  Location: [place];  Service: Cardiovascular;  Laterality: N/A;  . TEE WITHOUT CARDIOVERSION N/A 04/24/2013   Procedure: TRANSESOPHAGEAL ECHOCARDIOGRAM (TEE);  Surgeon: Dorothy Spark,  MD;  Location: [place];  Service: Cardiovascular;  Laterality: N/A;  . TEE WITHOUT CARDIOVERSION N/A 07/29/2015   Procedure: TRANSESOPHAGEAL ECHOCARDIOGRAM (TEE);  Surgeon: Fay Records, MD;  Location: [facility] ENDOSCOPY;  Service: Cardiovascular;  Laterality: N/A;  . TONSILLECTOMY      MEDICATIONS: . amLODipine (NORVASC) 5 MG tablet  . apixaban (ELIQUIS) 5 MG TABS tablet  . atorvastatin (LIPITOR) 40 MG tablet  . carvedilol (COREG) 25 MG tablet  . diphenhydramine-acetaminophen (TYLENOL PM) 25-500 MG TABS tablet  . escitalopram (LEXAPRO) 20 MG tablet  . gabapentin (NEURONTIN) 800 MG tablet  . losartan (COZAAR) 100 MG tablet  . psyllium (METAMUCIL) 58.6 % powder  . spironolactone (ALDACTONE) 25 MG tablet  . tamsulosin (FLOMAX) 0.4 MG CAPS capsule   No current facility-administered medications for this encounter.    Maia Plan [facility] Pre-Surgical Testing [phone] 05/19/18 2:22 PM

## 2018-05-20 ENCOUNTER — Other Ambulatory Visit (HOSPITAL_COMMUNITY)
Admission: RE | Admit: 2018-05-20 | Discharge: 2018-05-20 | Disposition: A | Discharge status: Home / Self Care | Payer: Medicare Other | Source: Ambulatory Visit | Attending: Orthopedic Surgery | Admitting: Orthopedic Surgery

## 2018-05-20 DIAGNOSIS — M1711 Unilateral primary osteoarthritis, right knee: Secondary | ICD-10-CM | POA: Diagnosis not present

## 2018-05-20 DIAGNOSIS — Z01812 Encounter for preprocedural laboratory examination: Secondary | ICD-10-CM | POA: Diagnosis not present

## 2018-05-20 DIAGNOSIS — Z1159 Encounter for screening for other viral diseases: Secondary | ICD-10-CM | POA: Diagnosis not present

## 2018-05-20 DIAGNOSIS — M25561 Pain in right knee: Secondary | ICD-10-CM | POA: Diagnosis not present

## 2018-05-21 LAB — NOVEL CORONAVIRUS, NAA (HOSP ORDER, SEND-OUT TO REF LAB; TAT 18-24 HRS): SARS-CoV-2, NAA: NOT DETECTED

## 2018-05-23 MED ORDER — DEXTROSE 5 % IV SOLN
3.0000 g | INTRAVENOUS | Status: AC
  Administered 2018-05-24: 3 g via INTRAVENOUS
  Filled 2018-05-23: qty 3

## 2018-05-24 ENCOUNTER — Encounter (HOSPITAL_COMMUNITY)
Admission: RE | Disposition: A | Discharge status: Home / Self Care | Payer: Self-pay | Source: Home / Self Care | Attending: Orthopedic Surgery

## 2018-05-24 ENCOUNTER — Observation Stay (HOSPITAL_COMMUNITY)
Admission: RE | Admit: 2018-05-24 | Discharge: 2018-05-25 | Disposition: A | Discharge status: Home / Self Care | Payer: Medicare Other | Attending: Orthopedic Surgery | Admitting: Orthopedic Surgery

## 2018-05-24 ENCOUNTER — Encounter (HOSPITAL_COMMUNITY): Payer: Self-pay | Admitting: Anesthesiology

## 2018-05-24 ENCOUNTER — Other Ambulatory Visit: Payer: Self-pay

## 2018-05-24 ENCOUNTER — Inpatient Hospital Stay (HOSPITAL_COMMUNITY): Payer: Medicare Other | Admitting: Physician Assistant

## 2018-05-24 ENCOUNTER — Inpatient Hospital Stay (HOSPITAL_COMMUNITY): Payer: Medicare Other | Admitting: Anesthesiology

## 2018-05-24 DIAGNOSIS — G2581 Restless legs syndrome: Secondary | ICD-10-CM | POA: Insufficient documentation

## 2018-05-24 DIAGNOSIS — Z7901 Long term (current) use of anticoagulants: Secondary | ICD-10-CM | POA: Insufficient documentation

## 2018-05-24 DIAGNOSIS — Z96659 Presence of unspecified artificial knee joint: Secondary | ICD-10-CM

## 2018-05-24 DIAGNOSIS — Z9119 Patient's noncompliance with other medical treatment and regimen: Secondary | ICD-10-CM | POA: Diagnosis not present

## 2018-05-24 DIAGNOSIS — I10 Essential (primary) hypertension: Secondary | ICD-10-CM | POA: Diagnosis not present

## 2018-05-24 DIAGNOSIS — Z86711 Personal history of pulmonary embolism: Secondary | ICD-10-CM | POA: Insufficient documentation

## 2018-05-24 DIAGNOSIS — E669 Obesity, unspecified: Secondary | ICD-10-CM | POA: Diagnosis not present

## 2018-05-24 DIAGNOSIS — I4819 Other persistent atrial fibrillation: Secondary | ICD-10-CM | POA: Diagnosis not present

## 2018-05-24 DIAGNOSIS — G4733 Obstructive sleep apnea (adult) (pediatric): Secondary | ICD-10-CM | POA: Diagnosis not present

## 2018-05-24 DIAGNOSIS — G8918 Other acute postprocedural pain: Secondary | ICD-10-CM | POA: Diagnosis not present

## 2018-05-24 DIAGNOSIS — Z87891 Personal history of nicotine dependence: Secondary | ICD-10-CM | POA: Diagnosis not present

## 2018-05-24 DIAGNOSIS — Z96652 Presence of left artificial knee joint: Secondary | ICD-10-CM | POA: Diagnosis not present

## 2018-05-24 DIAGNOSIS — E785 Hyperlipidemia, unspecified: Secondary | ICD-10-CM | POA: Insufficient documentation

## 2018-05-24 DIAGNOSIS — M1711 Unilateral primary osteoarthritis, right knee: Secondary | ICD-10-CM | POA: Diagnosis not present

## 2018-05-24 DIAGNOSIS — F411 Generalized anxiety disorder: Secondary | ICD-10-CM | POA: Diagnosis not present

## 2018-05-24 DIAGNOSIS — F329 Major depressive disorder, single episode, unspecified: Secondary | ICD-10-CM | POA: Insufficient documentation

## 2018-05-24 DIAGNOSIS — Z79899 Other long term (current) drug therapy: Secondary | ICD-10-CM | POA: Insufficient documentation

## 2018-05-24 DIAGNOSIS — Z6835 Body mass index (BMI) 35.0-35.9, adult: Secondary | ICD-10-CM | POA: Insufficient documentation

## 2018-05-24 DIAGNOSIS — N400 Enlarged prostate without lower urinary tract symptoms: Secondary | ICD-10-CM | POA: Diagnosis not present

## 2018-05-24 HISTORY — DX: Presence of unspecified artificial knee joint: Z96.659

## 2018-05-24 HISTORY — PX: TOTAL KNEE ARTHROPLASTY: SHX125

## 2018-05-24 LAB — TYPE AND SCREEN
ABO/RH(D): O NEG
Antibody Screen: NEGATIVE

## 2018-05-24 SURGERY — ARTHROPLASTY, KNEE, TOTAL
Anesthesia: General | Site: Knee | Laterality: Right

## 2018-05-24 MED ORDER — MIDAZOLAM HCL 2 MG/2ML IJ SOLN
INTRAMUSCULAR | Status: AC
  Filled 2018-05-24: qty 2

## 2018-05-24 MED ORDER — TRANEXAMIC ACID-NACL 1000-0.7 MG/100ML-% IV SOLN
1000.0000 mg | Freq: Once | INTRAVENOUS | Status: AC
  Administered 2018-05-24: 12:00:00 1000 mg via INTRAVENOUS
  Filled 2018-05-24: qty 100

## 2018-05-24 MED ORDER — ATORVASTATIN CALCIUM 40 MG PO TABS
40.0000 mg | ORAL_TABLET | Freq: Every day | ORAL | Status: DC
  Administered 2018-05-25: 09:00:00 40 mg via ORAL
  Filled 2018-05-24: qty 1

## 2018-05-24 MED ORDER — ALUM & MAG HYDROXIDE-SIMETH 200-200-20 MG/5ML PO SUSP: 15.0000 mL | ORAL | Status: DC | PRN

## 2018-05-24 MED ORDER — SUCCINYLCHOLINE CHLORIDE 200 MG/10ML IV SOSY
PREFILLED_SYRINGE | INTRAVENOUS | Status: DC | PRN
  Administered 2018-05-24: 120 mg via INTRAVENOUS

## 2018-05-24 MED ORDER — KETAMINE HCL 10 MG/ML IJ SOLN
INTRAMUSCULAR | Status: DC | PRN
  Administered 2018-05-24: 10 mg via INTRAVENOUS
  Administered 2018-05-24: 20 mg via INTRAVENOUS
  Administered 2018-05-24: 10 mg via INTRAVENOUS

## 2018-05-24 MED ORDER — ACETAMINOPHEN 10 MG/ML IV SOLN
INTRAVENOUS | Status: DC | PRN
  Administered 2018-05-24: 1000 mg via INTRAVENOUS

## 2018-05-24 MED ORDER — CARVEDILOL 25 MG PO TABS
25.0000 mg | ORAL_TABLET | Freq: Two times a day (BID) | ORAL | Status: DC
  Administered 2018-05-25: 09:00:00 25 mg via ORAL
  Filled 2018-05-24: qty 1

## 2018-05-24 MED ORDER — KETAMINE HCL 10 MG/ML IJ SOLN
INTRAMUSCULAR | Status: AC
  Filled 2018-05-24: qty 1

## 2018-05-24 MED ORDER — LIDOCAINE 2% (20 MG/ML) 5 ML SYRINGE
INTRAMUSCULAR | Status: DC | PRN
  Administered 2018-05-24: 100 mg via INTRAVENOUS

## 2018-05-24 MED ORDER — BUPIVACAINE-EPINEPHRINE (PF) 0.25% -1:200000 IJ SOLN
INTRAMUSCULAR | Status: DC | PRN
  Administered 2018-05-24: 30 mL

## 2018-05-24 MED ORDER — HYDROMORPHONE HCL 1 MG/ML IJ SOLN
0.2500 mg | INTRAMUSCULAR | Status: DC | PRN
  Administered 2018-05-24 (×3): 0.25 mg via INTRAVENOUS

## 2018-05-24 MED ORDER — CHLORHEXIDINE GLUCONATE 4 % EX LIQD: 60.0000 mL | Freq: Once | CUTANEOUS | Status: DC

## 2018-05-24 MED ORDER — OXYCODONE HCL 5 MG PO TABS: 5.0000 mg | ORAL_TABLET | Freq: Once | ORAL | Status: DC | PRN

## 2018-05-24 MED ORDER — CELECOXIB 200 MG PO CAPS
ORAL_CAPSULE | ORAL | Status: AC
  Administered 2018-05-24: 200 mg via ORAL
  Filled 2018-05-24: qty 1

## 2018-05-24 MED ORDER — CELECOXIB 200 MG PO CAPS
200.0000 mg | ORAL_CAPSULE | Freq: Two times a day (BID) | ORAL | Status: DC
  Administered 2018-05-24 – 2018-05-25 (×2): 200 mg via ORAL
  Filled 2018-05-24 (×2): qty 1

## 2018-05-24 MED ORDER — MENTHOL 3 MG MT LOZG: 1.0000 | LOZENGE | OROMUCOSAL | Status: DC | PRN

## 2018-05-24 MED ORDER — DEXAMETHASONE SODIUM PHOSPHATE 10 MG/ML IJ SOLN
10.0000 mg | Freq: Once | INTRAMUSCULAR | Status: AC
  Administered 2018-05-24: 10 mg via INTRAVENOUS

## 2018-05-24 MED ORDER — ROPIVACAINE HCL 7.5 MG/ML IJ SOLN
INTRAMUSCULAR | Status: DC | PRN
  Administered 2018-05-24: 20 mL via PERINEURAL

## 2018-05-24 MED ORDER — PROMETHAZINE HCL 25 MG/ML IJ SOLN
6.2500 mg | INTRAMUSCULAR | Status: DC | PRN
  Administered 2018-05-24: 10:00:00 6.25 mg via INTRAVENOUS

## 2018-05-24 MED ORDER — METOCLOPRAMIDE HCL 5 MG PO TABS: 5.0000 mg | ORAL_TABLET | Freq: Three times a day (TID) | ORAL | Status: DC | PRN

## 2018-05-24 MED ORDER — BUPIVACAINE-EPINEPHRINE (PF) 0.25% -1:200000 IJ SOLN
INTRAMUSCULAR | Status: AC
  Filled 2018-05-24: qty 30

## 2018-05-24 MED ORDER — FERROUS SULFATE 325 (65 FE) MG PO TABS
325.0000 mg | ORAL_TABLET | Freq: Two times a day (BID) | ORAL | Status: DC
  Administered 2018-05-24 – 2018-05-25 (×2): 325 mg via ORAL
  Filled 2018-05-24 (×2): qty 1

## 2018-05-24 MED ORDER — AMLODIPINE BESYLATE 5 MG PO TABS
5.0000 mg | ORAL_TABLET | Freq: Every evening | ORAL | Status: DC
  Administered 2018-05-24: 5 mg via ORAL
  Filled 2018-05-24: qty 1

## 2018-05-24 MED ORDER — TAMSULOSIN HCL 0.4 MG PO CAPS
0.4000 mg | ORAL_CAPSULE | Freq: Two times a day (BID) | ORAL | Status: DC
  Administered 2018-05-24 – 2018-05-25 (×2): 0.4 mg via ORAL
  Filled 2018-05-24 (×2): qty 1

## 2018-05-24 MED ORDER — DOCUSATE SODIUM 100 MG PO CAPS
100.0000 mg | ORAL_CAPSULE | Freq: Two times a day (BID) | ORAL | Status: DC
  Administered 2018-05-24 – 2018-05-25 (×2): 100 mg via ORAL
  Filled 2018-05-24 (×2): qty 1

## 2018-05-24 MED ORDER — ONDANSETRON HCL 4 MG/2ML IJ SOLN
INTRAMUSCULAR | Status: DC | PRN
  Administered 2018-05-24: 4 mg via INTRAVENOUS

## 2018-05-24 MED ORDER — MAGNESIUM CITRATE PO SOLN: 1.0000 | Freq: Once | ORAL | Status: DC | PRN

## 2018-05-24 MED ORDER — EPHEDRINE 5 MG/ML INJ
INTRAVENOUS | Status: AC
  Filled 2018-05-24: qty 10

## 2018-05-24 MED ORDER — ACETAMINOPHEN 10 MG/ML IV SOLN
INTRAVENOUS | Status: AC
  Filled 2018-05-24: qty 100

## 2018-05-24 MED ORDER — EPHEDRINE SULFATE-NACL 50-0.9 MG/10ML-% IV SOSY
PREFILLED_SYRINGE | INTRAVENOUS | Status: DC | PRN
  Administered 2018-05-24: 10 mg via INTRAVENOUS
  Administered 2018-05-24: 5 mg via INTRAVENOUS

## 2018-05-24 MED ORDER — METHOCARBAMOL 500 MG IVPB - SIMPLE MED
500.0000 mg | Freq: Four times a day (QID) | INTRAVENOUS | Status: DC | PRN
  Administered 2018-05-24: 09:00:00 500 mg via INTRAVENOUS
  Filled 2018-05-24: qty 50

## 2018-05-24 MED ORDER — ROCURONIUM BROMIDE 10 MG/ML (PF) SYRINGE
PREFILLED_SYRINGE | INTRAVENOUS | Status: AC
  Filled 2018-05-24: qty 10

## 2018-05-24 MED ORDER — HYDROCODONE-ACETAMINOPHEN 5-325 MG PO TABS
1.0000 | ORAL_TABLET | ORAL | Status: DC | PRN
  Administered 2018-05-24 – 2018-05-25 (×5): 2 via ORAL
  Filled 2018-05-24 (×5): qty 2

## 2018-05-24 MED ORDER — SODIUM CHLORIDE (PF) 0.9 % IJ SOLN
INTRAMUSCULAR | Status: DC | PRN
  Administered 2018-05-24: 30 mL

## 2018-05-24 MED ORDER — ESCITALOPRAM OXALATE 20 MG PO TABS
20.0000 mg | ORAL_TABLET | Freq: Every day | ORAL | Status: DC
  Administered 2018-05-25: 20 mg via ORAL
  Filled 2018-05-24: qty 1

## 2018-05-24 MED ORDER — MIDAZOLAM HCL 5 MG/5ML IJ SOLN
INTRAMUSCULAR | Status: DC | PRN
  Administered 2018-05-24: 1 mg via INTRAVENOUS

## 2018-05-24 MED ORDER — FENTANYL CITRATE (PF) 100 MCG/2ML IJ SOLN
INTRAMUSCULAR | Status: AC
  Filled 2018-05-24: qty 2

## 2018-05-24 MED ORDER — METHOCARBAMOL 500 MG IVPB - SIMPLE MED
INTRAVENOUS | Status: AC
  Administered 2018-05-24: 500 mg via INTRAVENOUS
  Filled 2018-05-24: qty 50

## 2018-05-24 MED ORDER — SPIRONOLACTONE 25 MG PO TABS
25.0000 mg | ORAL_TABLET | Freq: Every day | ORAL | Status: DC
  Administered 2018-05-25: 25 mg via ORAL
  Filled 2018-05-24: qty 1

## 2018-05-24 MED ORDER — POLYETHYLENE GLYCOL 3350 17 G PO PACK
17.0000 g | PACK | Freq: Two times a day (BID) | ORAL | Status: DC
  Administered 2018-05-25: 17 g via ORAL
  Filled 2018-05-24 (×2): qty 1

## 2018-05-24 MED ORDER — PROMETHAZINE HCL 25 MG/ML IJ SOLN
INTRAMUSCULAR | Status: AC
  Filled 2018-05-24: qty 1

## 2018-05-24 MED ORDER — KETOROLAC TROMETHAMINE 30 MG/ML IJ SOLN
INTRAMUSCULAR | Status: AC
  Filled 2018-05-24: qty 1

## 2018-05-24 MED ORDER — SODIUM CHLORIDE (PF) 0.9 % IJ SOLN
INTRAMUSCULAR | Status: AC
  Filled 2018-05-24: qty 50

## 2018-05-24 MED ORDER — MORPHINE SULFATE (PF) 4 MG/ML IV SOLN: 0.5000 mg | INTRAVENOUS | Status: DC | PRN

## 2018-05-24 MED ORDER — ROCURONIUM BROMIDE 10 MG/ML (PF) SYRINGE
PREFILLED_SYRINGE | INTRAVENOUS | Status: DC | PRN
  Administered 2018-05-24: 10 mg via INTRAVENOUS
  Administered 2018-05-24: 40 mg via INTRAVENOUS

## 2018-05-24 MED ORDER — SUGAMMADEX SODIUM 500 MG/5ML IV SOLN
INTRAVENOUS | Status: AC
  Filled 2018-05-24: qty 5

## 2018-05-24 MED ORDER — PHENOL 1.4 % MT LIQD
1.0000 | OROMUCOSAL | Status: DC | PRN
  Filled 2018-05-24: qty 177

## 2018-05-24 MED ORDER — PROPOFOL 10 MG/ML IV BOLUS
INTRAVENOUS | Status: AC
  Filled 2018-05-24: qty 80

## 2018-05-24 MED ORDER — POVIDONE-IODINE 10 % EX SWAB: 2.0000 "application " | Freq: Once | CUTANEOUS | Status: DC

## 2018-05-24 MED ORDER — CLONIDINE HCL (ANALGESIA) 100 MCG/ML EP SOLN
EPIDURAL | Status: DC | PRN
  Administered 2018-05-24: 70 ug

## 2018-05-24 MED ORDER — ONDANSETRON HCL 4 MG/2ML IJ SOLN: 4.0000 mg | Freq: Four times a day (QID) | INTRAMUSCULAR | Status: DC | PRN

## 2018-05-24 MED ORDER — DEXAMETHASONE SODIUM PHOSPHATE 10 MG/ML IJ SOLN
10.0000 mg | Freq: Once | INTRAMUSCULAR | Status: AC
  Administered 2018-05-25: 09:00:00 10 mg via INTRAVENOUS
  Filled 2018-05-24: qty 1

## 2018-05-24 MED ORDER — SUGAMMADEX SODIUM 500 MG/5ML IV SOLN
INTRAVENOUS | Status: DC | PRN
  Administered 2018-05-24: 300 mg via INTRAVENOUS

## 2018-05-24 MED ORDER — OXYCODONE HCL 5 MG/5ML PO SOLN: 5.0000 mg | Freq: Once | ORAL | Status: DC | PRN

## 2018-05-24 MED ORDER — BISACODYL 10 MG RE SUPP: 10.0000 mg | Freq: Every day | RECTAL | Status: DC | PRN

## 2018-05-24 MED ORDER — SODIUM CHLORIDE 0.9 % IV SOLN
INTRAVENOUS | Status: DC
  Administered 2018-05-24 – 2018-05-25 (×2): via INTRAVENOUS

## 2018-05-24 MED ORDER — GABAPENTIN 400 MG PO CAPS
800.0000 mg | ORAL_CAPSULE | ORAL | Status: DC
  Administered 2018-05-24 (×2): 800 mg via ORAL
  Filled 2018-05-24 (×2): qty 2

## 2018-05-24 MED ORDER — LOSARTAN POTASSIUM 50 MG PO TABS
100.0000 mg | ORAL_TABLET | Freq: Every day | ORAL | Status: DC
  Administered 2018-05-25: 100 mg via ORAL
  Filled 2018-05-24: qty 2

## 2018-05-24 MED ORDER — CEFAZOLIN SODIUM-DEXTROSE 2-4 GM/100ML-% IV SOLN
INTRAVENOUS | Status: AC
  Filled 2018-05-24: qty 100

## 2018-05-24 MED ORDER — METHOCARBAMOL 500 MG PO TABS
500.0000 mg | ORAL_TABLET | Freq: Four times a day (QID) | ORAL | Status: DC | PRN
  Administered 2018-05-24 – 2018-05-25 (×2): 500 mg via ORAL
  Filled 2018-05-24 (×2): qty 1

## 2018-05-24 MED ORDER — FENTANYL CITRATE (PF) 100 MCG/2ML IJ SOLN
INTRAMUSCULAR | Status: DC | PRN
  Administered 2018-05-24 (×2): 50 ug via INTRAVENOUS

## 2018-05-24 MED ORDER — HYDROMORPHONE HCL 1 MG/ML IJ SOLN
INTRAMUSCULAR | Status: AC
  Administered 2018-05-24: 0.25 mg via INTRAVENOUS
  Filled 2018-05-24: qty 1

## 2018-05-24 MED ORDER — HYDROCODONE-ACETAMINOPHEN 7.5-325 MG PO TABS: 1.0000 | ORAL_TABLET | ORAL | Status: DC | PRN

## 2018-05-24 MED ORDER — TRANEXAMIC ACID-NACL 1000-0.7 MG/100ML-% IV SOLN
1000.0000 mg | INTRAVENOUS | Status: AC
  Administered 2018-05-24: 1000 mg via INTRAVENOUS
  Filled 2018-05-24: qty 100

## 2018-05-24 MED ORDER — DIPHENHYDRAMINE HCL 12.5 MG/5ML PO ELIX: 12.5000 mg | ORAL_SOLUTION | ORAL | Status: DC | PRN

## 2018-05-24 MED ORDER — SODIUM CHLORIDE 0.9 % IR SOLN
Status: DC | PRN
  Administered 2018-05-24: 1000 mL

## 2018-05-24 MED ORDER — KETOROLAC TROMETHAMINE 30 MG/ML IJ SOLN
INTRAMUSCULAR | Status: DC | PRN
  Administered 2018-05-24: 30 mg via INTRA_ARTICULAR

## 2018-05-24 MED ORDER — LIDOCAINE 2% (20 MG/ML) 5 ML SYRINGE
INTRAMUSCULAR | Status: AC
  Filled 2018-05-24: qty 5

## 2018-05-24 MED ORDER — CEFAZOLIN SODIUM-DEXTROSE 2-4 GM/100ML-% IV SOLN
2.0000 g | Freq: Four times a day (QID) | INTRAVENOUS | Status: AC
  Administered 2018-05-24 (×2): 2 g via INTRAVENOUS
  Filled 2018-05-24 (×2): qty 100

## 2018-05-24 MED ORDER — PROPOFOL 10 MG/ML IV BOLUS
INTRAVENOUS | Status: DC | PRN
  Administered 2018-05-24: 190 mg via INTRAVENOUS

## 2018-05-24 MED ORDER — METOCLOPRAMIDE HCL 5 MG/ML IJ SOLN: 5.0000 mg | Freq: Three times a day (TID) | INTRAMUSCULAR | Status: DC | PRN

## 2018-05-24 MED ORDER — ONDANSETRON HCL 4 MG PO TABS
4.0000 mg | ORAL_TABLET | Freq: Four times a day (QID) | ORAL | Status: DC | PRN
  Administered 2018-05-24: 4 mg via ORAL
  Filled 2018-05-24: qty 1

## 2018-05-24 MED ORDER — APIXABAN 2.5 MG PO TABS
2.5000 mg | ORAL_TABLET | Freq: Two times a day (BID) | ORAL | Status: DC
  Administered 2018-05-25: 2.5 mg via ORAL
  Filled 2018-05-24: qty 1

## 2018-05-24 MED ORDER — LACTATED RINGERS IV SOLN
INTRAVENOUS | Status: DC | PRN
  Administered 2018-05-24: 06:00:00 via INTRAVENOUS
  Administered 2018-05-24: 1000 mL via INTRAVENOUS
  Administered 2018-05-24: 09:00:00 via INTRAVENOUS

## 2018-05-24 MED ORDER — ACETAMINOPHEN 325 MG PO TABS: 325.0000 mg | ORAL_TABLET | Freq: Four times a day (QID) | ORAL | Status: DC | PRN

## 2018-05-24 SURGICAL SUPPLY — 56 items
ATTUNE MED ANAT PAT 41 KNEE (Knees) ×1 IMPLANT
ATTUNE PS FEM RT SZ 8 CEM KNEE (Femur) ×1 IMPLANT
ATTUNE PSRP INSR SZ8 6 KNEE (Insert) ×1 IMPLANT
BAG ZIPLOCK 12X15 (MISCELLANEOUS) IMPLANT
BANDAGE ACE 6X5 VEL STRL LF (GAUZE/BANDAGES/DRESSINGS) ×2 IMPLANT
BASE TIBIAL ROT PLAT SZ 8 KNEE (Knees) IMPLANT
BLADE SAW SGTL 11.0X1.19X90.0M (BLADE) IMPLANT
BLADE SAW SGTL 13.0X1.19X90.0M (BLADE) ×2 IMPLANT
BLADE SURG SZ10 CARB STEEL (BLADE) ×4 IMPLANT
BOWL SMART MIX CTS (DISPOSABLE) ×2 IMPLANT
CEMENT HV SMART SET (Cement) ×2 IMPLANT
CHLORAPREP W/TINT 26 (MISCELLANEOUS) ×4 IMPLANT
COVER SURGICAL LIGHT HANDLE (MISCELLANEOUS) ×2 IMPLANT
COVER WAND RF STERILE (DRAPES) IMPLANT
CUFF TOURN SGL QUICK 34 (TOURNIQUET CUFF) ×1
CUFF TRNQT CYL 34X4.125X (TOURNIQUET CUFF) ×1 IMPLANT
DECANTER SPIKE VIAL GLASS SM (MISCELLANEOUS) ×4 IMPLANT
DERMABOND ADVANCED (GAUZE/BANDAGES/DRESSINGS) ×1
DERMABOND ADVANCED .7 DNX12 (GAUZE/BANDAGES/DRESSINGS) ×1 IMPLANT
DRAPE U-SHAPE 47X51 STRL (DRAPES) ×2 IMPLANT
DRESSING AQUACEL AG SP 3.5X10 (GAUZE/BANDAGES/DRESSINGS) ×1 IMPLANT
DRSG AQUACEL AG SP 3.5X10 (GAUZE/BANDAGES/DRESSINGS) ×2
ELECT REM PT RETURN 15FT ADLT (MISCELLANEOUS) ×2 IMPLANT
GLOVE BIOGEL PI IND STRL 6.5 (GLOVE) ×1 IMPLANT
GLOVE BIOGEL PI IND STRL 7.5 (GLOVE) ×1 IMPLANT
GLOVE BIOGEL PI IND STRL 8.5 (GLOVE) ×1 IMPLANT
GLOVE BIOGEL PI INDICATOR 6.5 (GLOVE) ×1
GLOVE BIOGEL PI INDICATOR 7.5 (GLOVE) ×1
GLOVE BIOGEL PI INDICATOR 8.5 (GLOVE) ×1
GLOVE ECLIPSE 8.0 STRL XLNG CF (GLOVE) ×2 IMPLANT
GLOVE ORTHO TXT STRL SZ7.5 (GLOVE) ×2 IMPLANT
GOWN STRL REUS W/ TWL LRG LVL3 (GOWN DISPOSABLE) ×1 IMPLANT
GOWN STRL REUS W/TWL 2XL LVL3 (GOWN DISPOSABLE) ×2 IMPLANT
GOWN STRL REUS W/TWL LRG LVL3 (GOWN DISPOSABLE) ×3 IMPLANT
HANDPIECE INTERPULSE COAX TIP (DISPOSABLE) ×1
HOLDER FOLEY CATH W/STRAP (MISCELLANEOUS) IMPLANT
KIT TURNOVER KIT A (KITS) IMPLANT
MANIFOLD NEPTUNE II (INSTRUMENTS) ×2 IMPLANT
NDL SAFETY ECLIPSE 18X1.5 (NEEDLE) IMPLANT
NEEDLE HYPO 18GX1.5 SHARP (NEEDLE) ×1
NS IRRIG 1000ML POUR BTL (IV SOLUTION) ×2 IMPLANT
PACK TOTAL KNEE CUSTOM (KITS) ×2 IMPLANT
PROTECTOR NERVE ULNAR (MISCELLANEOUS) ×2 IMPLANT
SET HNDPC FAN SPRY TIP SCT (DISPOSABLE) ×1 IMPLANT
SET PAD KNEE POSITIONER (MISCELLANEOUS) ×2 IMPLANT
SUT MNCRL AB 4-0 PS2 18 (SUTURE) ×2 IMPLANT
SUT STRATAFIX PDS+ 0 24IN (SUTURE) ×2 IMPLANT
SUT VIC AB 1 CT1 36 (SUTURE) ×2 IMPLANT
SUT VIC AB 2-0 CT1 27 (SUTURE) ×3
SUT VIC AB 2-0 CT1 TAPERPNT 27 (SUTURE) ×3 IMPLANT
SYR 3ML LL SCALE MARK (SYRINGE) ×2 IMPLANT
TIBIAL BASE ROT PLAT SZ 8 KNEE (Knees) ×2 IMPLANT
TRAY FOLEY MTR SLVR 16FR STAT (SET/KITS/TRAYS/PACK) ×2 IMPLANT
WATER STERILE IRR 1000ML POUR (IV SOLUTION) ×4 IMPLANT
WRAP KNEE MAXI GEL POST OP (GAUZE/BANDAGES/DRESSINGS) ×2 IMPLANT
YANKAUER SUCT BULB TIP 10FT TU (MISCELLANEOUS) ×2 IMPLANT

## 2018-05-24 NOTE — Transfer of Care (Signed)
Immediate Anesthesia Transfer of Care Note  Patient: Brandon B Whaling Jr.  Procedure(s) Performed: Procedure(s) with comments: RIGHT TOTAL KNEE ARTHROPLASTY (Right) - 70 mins  Patient Location: PACU  Anesthesia Type:General  Level of Consciousness:  sedated, patient cooperative and responds to stimulation  Airway & Oxygen Therapy:Patient Spontanous Breathing and Patient connected to face mask oxgen  Post-op Assessment:  Report given to PACU RN and Post -op Vital signs reviewed and stable  Post vital signs:  Reviewed and stable  Last Vitals:  Vitals:   05/24/18 0621  BP: (!) 148/99  Pulse: 78  Resp: 18  Temp: 36.8 C  SpO2: 37%    Complications: No apparent anesthesia complications

## 2018-05-24 NOTE — Anesthesia Postprocedure Evaluation (Signed)
Anesthesia Post Note  Patient: Brandon [name].  Procedure(s) Performed: RIGHT TOTAL KNEE ARTHROPLASTY (Right Knee)     Patient location during evaluation: PACU Anesthesia Type: General Level of consciousness: awake and alert Pain management: pain level controlled Vital Signs Assessment: post-procedure vital signs reviewed and stable Respiratory status: spontaneous breathing, nonlabored ventilation, respiratory function stable and patient connected to nasal cannula oxygen Cardiovascular status: blood pressure returned to baseline and stable Postop Assessment: no apparent nausea or vomiting Anesthetic complications: no    Last Vitals:  Vitals:   05/24/18 0945 05/24/18 1000  BP: (!) 92/59 96/65  Pulse: 63 64  Resp: 12 12  Temp:    SpO2: 92% 92%    Last Pain:  Vitals:   05/24/18 1000  TempSrc:   PainSc: Asleep                 Ramonita Koenig S

## 2018-05-24 NOTE — Evaluation (Signed)
Physical Therapy Evaluation Patient Details Name: Brandon [name]. MRN: [id number] DOB: 1950/02/28 Today's Date: 05/24/2018   History of Present Illness  68 yo male s/p R TKA 05/24/18. Hx of L TKA  Clinical Impression  On eval POD 0, pt required Min assist for mobility. He walked ~70 feet with a RW. Moderate pain with activity. Anticipate pt will progress well during hospital stay. Will plan to follow and progress activity as tolerated. D/c plan is for home with OP PT f/u.     Follow Up Recommendations Follow surgeon's recommendation for DC plan and follow-up therapies    Equipment Recommendations  Rolling walker with 5" wheels    Recommendations for Other Services       Precautions / Restrictions Precautions Precautions: Knee;Fall Restrictions Weight Bearing Restrictions: No Other Position/Activity Restrictions: WBAT      Mobility  Bed Mobility Overal bed mobility: Needs Assistance Bed Mobility: Supine to Sit     Supine to sit: Supervision;HOB elevated     General bed mobility comments: for safety, lines  Transfers Overall transfer level: Needs assistance Equipment used: Rolling walker (2 wheeled) Transfers: Sit to/from Stand Sit to Stand: Min assist;From elevated surface         General transfer comment: Assist to rise, stabilize, control descent. VCs safety, technique, hand /LE placement.   Ambulation/Gait Ambulation/Gait assistance: Min assist Gait Distance (Feet): 70 Feet Assistive device: Rolling walker (2 wheeled) Gait Pattern/deviations: Step-to pattern;Step-through pattern;Decreased stride length;Decreased step length - left;Decreased step length - right     General Gait Details: VCs safety, technique, sequence. Slow gait speed. Pt c/o mild lightheadedness.   Stairs            Wheelchair Mobility    Modified Rankin (Stroke Patients Only)       Balance Overall balance assessment: Needs assistance         Standing balance support:  Bilateral upper extremity supported Standing balance-Leahy Scale: Poor                               Pertinent Vitals/Pain Pain Assessment: 0-10 Pain Score: 6  Pain Location: R knee Pain Descriptors / Indicators: Sore;Aching Pain Intervention(s): Limited activity within patient's tolerance    Home Living Family/patient expects to be discharged to:: Private residence Living Arrangements: Alone   Type of Home: House Home Access: Stairs to enter Entrance Stairs-Rails: Right Entrance Stairs-Number of Steps: 13 or 4 depending on entrance (pt is planning on going up the 13, if possible) Home Layout: One level Home Equipment: Walker - 2 wheels;Shower seat;Cane - single point      Prior Function Level of Independence: Independent               Hand Dominance        Extremity/Trunk Assessment   Upper Extremity Assessment Upper Extremity Assessment: Overall WFL for tasks assessed    Lower Extremity Assessment Lower Extremity Assessment: Generalized weakness(post op weakness)    Cervical / Trunk Assessment Cervical / Trunk Assessment: Normal  Communication   Communication: No difficulties  Cognition Arousal/Alertness: Awake/alert Behavior During Therapy: WFL for tasks assessed/performed Overall Cognitive Status: Within Functional Limits for tasks assessed                                        General Comments  Exercises     Assessment/Plan    PT Assessment Patient needs continued PT services  PT Problem List Decreased strength;Decreased range of motion;Decreased mobility;Decreased balance;Decreased knowledge of use of DME;Decreased activity tolerance;Pain       PT Treatment Interventions DME instruction;Gait training;Functional mobility training;Therapeutic activities;Balance training;Patient/family education;Therapeutic exercise    PT Goals (Current goals can be found in the Care Plan section)  Acute Rehab PT  Goals Patient Stated Goal: home. begin OP PT on Fri. PT Goal Formulation: With patient Time For Goal Achievement: 06/07/18 Potential to Achieve Goals: Good    Frequency 7X/week   Barriers to discharge        Co-evaluation               AM-PAC PT "6 Clicks" Mobility  Outcome Measure Help needed turning from your back to your side while in a flat bed without using bedrails?: A Little Help needed moving from lying on your back to sitting on the side of a flat bed without using bedrails?: A Little Help needed moving to and from a bed to a chair (including a wheelchair)?: A Little Help needed standing up from a chair using your arms (e.g., wheelchair or bedside chair)?: A Little Help needed to walk in hospital room?: A Little Help needed climbing 3-5 steps with a railing? : A Little 6 Click Score: 18    End of Session Equipment Utilized During Treatment: Gait belt Activity Tolerance: Patient tolerated treatment well Patient left: in chair;with call bell/phone within reach   PT Visit Diagnosis: Unsteadiness on feet (R26.81);Pain;Muscle weakness (generalized) (M62.81);Difficulty in walking, not elsewhere classified (R26.2) Pain - Right/Left: Right Pain - part of body: Knee    Time: 1610-9604 PT Time Calculation (min) (ACUTE ONLY): 25 min   Charges:   PT Evaluation $PT Eval Low Complexity: 1 Low PT Treatments $Gait Training: 8-22 mins         Weston Anna, PT Acute Rehabilitation Services Pager: [phone] Office: [phone]

## 2018-05-24 NOTE — Anesthesia Procedure Notes (Signed)
Anesthesia Regional Block: Adductor canal block   Pre-Anesthetic Checklist: ,, timeout performed, Correct Patient, Correct Site, Correct Laterality, Correct Procedure, Correct Position, site marked, Risks and benefits discussed,  Surgical consent,  Pre-op evaluation,  At surgeon's request and post-op pain management  Laterality: Right  Prep: chloraprep       Needles:  Injection technique: Single-shot  Needle Type: Echogenic Needle     Needle Length: 9cm      Additional Needles:   Procedures:,,,, ultrasound used (permanent image in chart),,,,  Narrative:  Start time: 05/24/2018 7:08 AM End time: 05/24/2018 7:16 AM Injection made incrementally with aspirations every 5 mL.  Performed by: Personally  Anesthesiologist: Myrtie Soman, MD  Additional Notes: Patient tolerated the procedure well without complications

## 2018-05-24 NOTE — Interval H&P Note (Signed)
History and Physical Interval Note:  05/24/2018 7:17 AM  Brandon B Lad Jr.  has presented today for surgery, with the diagnosis of Right knee osteoarthritis.  The various methods of treatment have been discussed with the patient and family. After consideration of risks, benefits and other options for treatment, the patient has consented to  Procedure(s) with comments: TOTAL KNEE ARTHROPLASTY (Right) - 70 mins as a surgical intervention.  The patient's history has been reviewed, patient examined, no change in status, stable for surgery.  I have reviewed the patient's chart and labs.  Questions were answered to the patient's satisfaction.     [name]

## 2018-05-24 NOTE — Op Note (Signed)
NAME:  Brandon [name].                      MEDICAL RECORD NO.:  [id number]                             FACILITY:  [facility]      PHYSICIAN:  Pietro Cassis. Alvan Dame, M.D.  DATE OF BIRTH:  1950-09-05      DATE OF PROCEDURE:  05/24/2018                                     OPERATIVE REPORT         PREOPERATIVE DIAGNOSIS:  Right knee osteoarthritis.      POSTOPERATIVE DIAGNOSIS:  Right knee osteoarthritis.      FINDINGS:  The patient was noted to have complete loss of cartilage and   bone-on-bone arthritis with associated osteophytes in the medial and patellofemoral compartments of   the knee with a significant synovitis and associated effusion.  The patient had failed months of conservative treatment including medications, injection therapy, activity modification.     PROCEDURE:  Right total knee replacement.      COMPONENTS USED:  DePuy Attune rotating platform posterior stabilized knee   system, a size 8 femur, 8 tibia, size 6 mm PS AOX insert, and 41 anatomic patellar   button.      SURGEON:  Pietro Cassis. Alvan Dame, M.D.      ASSISTANT:  Danae Orleans, PA-C.      ANESTHESIA:  General and Regional.      SPECIMENS:  None.      COMPLICATION:  None.      DRAINS:  None.  EBL: <100cc      TOURNIQUET TIME:   Total Tourniquet Time Documented: Thigh (Right) - 37 minutes Total: Thigh (Right) - 37 minutes  .      The patient was stable to the recovery room.      INDICATION FOR PROCEDURE:  Brandon Mccaughey. is a 68 y.o. male patient of   mine.  The patient had been seen, evaluated, and treated for months conservatively in the   office with medication, activity modification, and injections.  The patient had   radiographic changes of bone-on-bone arthritis with endplate sclerosis and osteophytes noted.  Based on the radiographic changes and failed conservative measures, the patient   decided to proceed with definitive treatment, total knee replacement.  Risks of infection, DVT, component failure,  need for revision surgery, neurovascular injury were reviewed in the office setting.  The postop course was reviewed stressing the efforts to maximize post-operative satisfaction and function.  Consent was obtained for benefit of pain   relief.      PROCEDURE IN DETAIL:  The patient was brought to the operative theater.   Once adequate anesthesia, preoperative antibiotics, 2 gm of Ancef,1 gm of Tranexamic Acid, and 10 mg of Decadron administered, the patient was positioned supine with a right thigh tourniquet placed.  The  right lower extremity was prepped and draped in sterile fashion.  A time-   out was performed identifying the patient, planned procedure, and the appropriate extremity.      The right lower extremity was placed in the [facility] leg holder.  The leg was   exsanguinated, tourniquet elevated to 250 mmHg.  A midline incision was  made followed by median parapatellar arthrotomy.  Following initial   exposure, attention was first directed to the patella.  Precut   measurement was noted to be 26 mm.  I resected down to 15 mm and used a   41 anatomic patellar button to restore patellar height as well as cover the cut surface.      The lug holes were drilled and a metal shim was placed to protect the   patella from retractors and saw blade during the procedure.      At this point, attention was now directed to the femur.  The femoral   canal was opened with a drill, irrigated to try to prevent fat emboli.  An   intramedullary rod was passed at 5 degrees valgus, 9 mm of bone was   resected off the distal femur.  Following this resection, the tibia was   subluxated anteriorly.  Using the extramedullary guide, 2 mm of bone was resected off   the proximal medial tibia.  We confirmed the gap would be   stable medially and laterally with a size 5 spacer block as well as confirmed that the tibial cut was perpendicular in the coronal plane, checking with an alignment rod.      Once this was  done, I sized the femur to be a size 8 in the anterior-   posterior dimension, chose a standard component based on medial and   lateral dimension.  The size 8 rotation block was then pinned in   position anterior referenced using the C-clamp to set rotation.  The   anterior, posterior, and  chamfer cuts were made without difficulty nor   notching making certain that I was along the anterior cortex to help   with flexion gap stability. Posterior osteophytes were removed off the femur.     The final box cut was made off the lateral aspect of distal femur.      At this point, the tibia was sized to be a size 8.  The size 8 tray was   then pinned in position through the medial third of the tubercle,   drilled, and keel punched.  Trial reduction was now carried with a 8 femur,  8 tibia, a size 6 mm PS insert, and the 41 anatomic patella botton.  The knee was brought to full extension with good flexion stability with the patella   tracking through the trochlea without application of pressure.  Given   all these findings the trial components removed.  Final components were   opened and cement was mixed.  The knee was irrigated with normal saline solution and pulse lavage.  The synovial lining was   then injected with 30 cc of 0.25% Marcaine with epinephrine, 1 cc of Toradol and 30 cc of NS for a total of 61 cc.     Final implants were then cemented onto cleaned and dried cut surfaces of bone with the knee brought to extension with a size 6 mm PS trial insert.      Once the cement had fully cured, excess cement was removed   throughout the knee.  I confirmed that I was satisfied with the range of   motion and stability, and the final size 6 mm PS AOX insert was chosen.  It was   placed into the knee.      The tourniquet had been let down at 37 minutes.  No significant   hemostasis was required.  The extensor mechanism  was then reapproximated using #1 Vicryl and #1 Stratafix sutures with the knee    in flexion.  The   remaining wound was closed with 2-0 Vicryl and running 4-0 Monocryl.   The knee was cleaned, dried, dressed sterilely using Dermabond and   Aquacel dressing.  The patient was then   brought to recovery room in stable condition, tolerating the procedure   well.   Please note that Physician Assistant, Danae Orleans, PA-C was present for the entirety of the case, and was utilized for pre-operative positioning, peri-operative retractor management, general facilitation of the procedure and for primary wound closure at the end of the case.              Pietro Cassis Alvan Dame, M.D.    05/24/2018 9:01 AM

## 2018-05-24 NOTE — Anesthesia Procedure Notes (Signed)
Procedure Name: Intubation Date/Time: 05/24/2018 7:43 AM Performed by: Lavina Hamman, CRNA Pre-anesthesia Checklist: Patient identified, Emergency Drugs available, Suction available, Patient being monitored and Timeout performed Patient Re-evaluated:Patient Re-evaluated prior to induction Oxygen Delivery Method: Circle system utilized Preoxygenation: Pre-oxygenation with 100% oxygen Induction Type: IV induction, Cricoid Pressure applied and Rapid sequence Laryngoscope Size: Mac and 4 Grade View: Grade I Tube type: Oral Tube size: 7.0 mm Number of attempts: 1 Airway Equipment and Method: Stylet Placement Confirmation: ETT inserted through vocal cords under direct vision,  positive ETCO2,  CO2 detector and breath sounds checked- equal and bilateral Secured at: 23 cm Tube secured with: Tape Dental Injury: Teeth and Oropharynx as per pre-operative assessment  Comments: ATOI.  Pt had black coffee at 0430.

## 2018-05-24 NOTE — Anesthesia Procedure Notes (Signed)
Anesthesia Procedure Image    

## 2018-05-24 NOTE — Discharge Instructions (Signed)
Information on my medicine - ELIQUIS (apixaban)   Why was Eliquis prescribed for you? Eliquis was prescribed for you to reduce the risk of blood clots forming after orthopedic surgery.    What do You need to know about Eliquis? Take your Eliquis TWICE DAILY - one tablet in the morning and one tablet in the evening with or without food.  It would be best to take the dose about the same time each day.  If you have difficulty swallowing the tablet whole please discuss with your pharmacist how to take the medication safely.  Take Eliquis exactly as prescribed by your doctor and DO NOT stop taking Eliquis without talking to the doctor who prescribed the medication.  Stopping without other medication to take the place of Eliquis may increase your risk of developing a clot.  After discharge, you should have regular check-up appointments with your healthcare provider that is prescribing your Eliquis.  What do you do if you miss a dose? If a dose of ELIQUIS is not taken at the scheduled time, take it as soon as possible on the same day and twice-daily administration should be resumed.  The dose should not be doubled to make up for a missed dose.  Do not take more than one tablet of ELIQUIS at the same time.  Important Safety Information A possible side effect of Eliquis is bleeding. You should call your healthcare provider right away if you experience any of the following: ? Bleeding from an injury or your nose that does not stop. ? Unusual colored urine (red or dark brown) or unusual colored stools (red or black). ? Unusual bruising for unknown reasons. ? A serious fall or if you hit your head (even if there is no bleeding).  Some medicines may interact with Eliquis and might increase your risk of bleeding or clotting while on Eliquis. To help avoid this, consult your healthcare provider or pharmacist prior to using any new prescription or non-prescription medications, including  herbals, vitamins, non-steroidal anti-inflammatory drugs (NSAIDs) and supplements.  This website has more information on Eliquis (apixaban): http://www.eliquis.com/eliquis/home DRESSING / WOUND CARE / SHOWERING  Keep the surgical dressing until follow up.  The dressing is water proof, so you can shower without any extra covering.  IF THE DRESSING FALLS OFF or the wound gets wet inside, change the dressing with sterile gauze.  Please use good hand washing techniques before changing the dressing.  Do not use any lotions or creams on the incision until instructed by your surgeon.    ACTIVITY  o Increase activity slowly as tolerated, but follow the weight bearing instructions below.   o No driving for 6 weeks or until further direction given by your physician.  You cannot drive while taking narcotics.  o No lifting or carrying greater than 10 lbs. until further directed by your surgeon. o Avoid periods of inactivity such as sitting longer than an hour when not asleep. This helps prevent blood clots.  o You may return to work once you are authorized by your doctor.     WEIGHT BEARING   Weight bearing as tolerated with assist device (walker, cane, etc) as directed, use it as long as suggested by your surgeon or therapist, typically at least 4-6 weeks.   EXERCISES  Results after joint replacement surgery are often greatly improved when you follow the exercise, range of motion and muscle strengthening exercises prescribed by your doctor. Safety measures are also important to protect the joint from further injury.  Any time any of these exercises cause you to have increased pain or swelling, decrease what you are doing until you are comfortable again and then slowly increase them. If you have problems or questions, call your caregiver or physical therapist for advice.   Rehabilitation is important following a joint replacement. After just a few days of immobilization, the muscles of the leg can  become weakened and shrink (atrophy).  These exercises are designed to build up the tone and strength of the thigh and leg muscles and to improve motion. Often times heat used for twenty to thirty minutes before working out will loosen up your tissues and help with improving the range of motion but do not use heat for the first two weeks following surgery (sometimes heat can increase post-operative swelling).   These exercises can be done on a training (exercise) mat, on the floor, on a table or on a bed. Use whatever works the best and is most comfortable for you.    Use music or television while you are exercising so that the exercises are a pleasant break in your day. This will make your life better with the exercises acting as a break in your routine that you can look forward to.   Perform all exercises about fifteen times, three times per day or as directed.  You should exercise both the operative leg and the other leg as well.  Exercises include:    Quad Sets - Tighten up the muscle on the front of the thigh (Quad) and hold for 5-10 seconds.    Straight Leg Raises - With your knee straight (if you were given a brace, keep it on), lift the leg to 60 degrees, hold for 3 seconds, and slowly lower the leg.  Perform this exercise against resistance later as your leg gets stronger.   Leg Slides: Lying on your back, slowly slide your foot toward your buttocks, bending your knee up off the floor (only go as far as is comfortable). Then slowly slide your foot back down until your leg is flat on the floor again.   Angel Wings: Lying on your back spread your legs to the side as far apart as you can without causing discomfort.   Hamstring Strength:  Lying on your back, push your heel against the floor with your leg straight by tightening up the muscles of your buttocks.  Repeat, but this time bend your knee to a comfortable angle, and push your heel against the floor.  You may put a pillow under the heel to  make it more comfortable if necessary.   A rehabilitation program following joint replacement surgery can speed recovery and prevent re-injury in the future due to weakened muscles. Contact your doctor or a physical therapist for more information on knee rehabilitation.    CONSTIPATION  Constipation is defined medically as fewer than three stools per week and severe constipation as less than one stool per week.  Even if you have a regular bowel pattern at home, your normal regimen is likely to be disrupted due to multiple reasons following surgery.  Combination of anesthesia, postoperative narcotics, change in appetite and fluid intake all can affect your bowels.   YOU MUST use at least one of the following options; they are listed in order of increasing strength to get the job done.  They are all available over the counter, and you may need to use some, POSSIBLY even all of these options:    Drink plenty of  fluids (prune juice may be helpful) and high fiber foods Colace 100 mg by mouth twice a day  Senokot for constipation as directed and as needed Dulcolax (bisacodyl), take with full glass of water  Miralax (polyethylene glycol) once or twice a day as needed.  If you have tried all these things and are unable to have a bowel movement in the first 3-4 days after surgery call either your surgeon or your primary doctor.    If you experience loose stools or diarrhea, hold the medications until you stool forms back up.  If your symptoms do not get better within 1 week or if they get worse, check with your doctor.  If you experience "the worst abdominal pain ever" or develop nausea or vomiting, please contact the office immediately for further recommendations for treatment.   ITCHING:  If you experience itching with your medications, try taking only a single pain pill, or even half a pain pill at a time.  You can also use Benadryl over the counter for itching or also to help with sleep.   TED HOSE  STOCKINGS:  Use stockings on both legs until for at least 2 weeks or as directed by physician office. They may be removed at night for sleeping.  MEDICATIONS:  See your medication summary on the After Visit Summary that nursing will review with you.  You may have some home medications which will be placed on hold until you complete the course of blood thinner medication.  It is important for you to complete the blood thinner medication as prescribed.  PRECAUTIONS:  If you experience chest pain or shortness of breath - call 911 immediately for transfer to the hospital emergency department.   If you develop a fever greater that 101 F, purulent drainage from wound, increased redness or drainage from wound, foul odor from the wound/dressing, or calf pain - CONTACT YOUR SURGEON.                                                   FOLLOW-UP APPOINTMENTS:  If you do not already have a post-op appointment, please call the office for an appointment to be seen by your surgeon.  Guidelines for how soon to be seen are listed in your After Visit Summary, but are typically between 1-4 weeks after surgery.  OTHER INSTRUCTIONS:   Knee Replacement:  Do not place pillow under knee, focus on keeping the knee straight while resting.   MAKE SURE YOU:   Understand these instructions.   Get help right away if you are not doing well or get worse.    Thank you for letting us be a part of your medical care team.  It is a privilege we respect greatly.  We hope these instructions will help you stay on track for a fast and full recovery!

## 2018-05-25 ENCOUNTER — Encounter (HOSPITAL_COMMUNITY): Payer: Self-pay | Admitting: Orthopedic Surgery

## 2018-05-25 DIAGNOSIS — F329 Major depressive disorder, single episode, unspecified: Secondary | ICD-10-CM | POA: Diagnosis not present

## 2018-05-25 DIAGNOSIS — E669 Obesity, unspecified: Secondary | ICD-10-CM | POA: Diagnosis not present

## 2018-05-25 DIAGNOSIS — I10 Essential (primary) hypertension: Secondary | ICD-10-CM | POA: Diagnosis not present

## 2018-05-25 DIAGNOSIS — E785 Hyperlipidemia, unspecified: Secondary | ICD-10-CM | POA: Diagnosis not present

## 2018-05-25 DIAGNOSIS — G4733 Obstructive sleep apnea (adult) (pediatric): Secondary | ICD-10-CM | POA: Diagnosis not present

## 2018-05-25 DIAGNOSIS — M1711 Unilateral primary osteoarthritis, right knee: Secondary | ICD-10-CM | POA: Diagnosis not present

## 2018-05-25 LAB — CBC
HCT: 33.1 % — ABNORMAL LOW (ref 39.0–52.0)
Hemoglobin: 10.7 g/dL — ABNORMAL LOW (ref 13.0–17.0)
MCH: 29.9 pg (ref 26.0–34.0)
MCHC: 32.3 g/dL (ref 30.0–36.0)
MCV: 92.5 fL (ref 80.0–100.0)
Platelets: 144 10*3/uL — ABNORMAL LOW (ref 150–400)
RBC: 3.58 MIL/uL — ABNORMAL LOW (ref 4.22–5.81)
RDW: 12.9 % (ref 11.5–15.5)
WBC: 11.1 10*3/uL — ABNORMAL HIGH (ref 4.0–10.5)
nRBC: 0 % (ref 0.0–0.2)

## 2018-05-25 LAB — BASIC METABOLIC PANEL
Anion gap: 7 (ref 5–15)
BUN: 18 mg/dL (ref 8–23)
CO2: 24 mmol/L (ref 22–32)
Calcium: 8.2 mg/dL — ABNORMAL LOW (ref 8.9–10.3)
Chloride: 106 mmol/L (ref 98–111)
Creatinine, Ser: 0.74 mg/dL (ref 0.61–1.24)
GFR calc Af Amer: >60 mL/min (ref >60)
GFR calc non Af Amer: >60 mL/min (ref >60)
Glucose, Bld: 119 mg/dL — ABNORMAL HIGH (ref 70–99)
Potassium: 3.5 mmol/L (ref 3.5–5.1)
Sodium: 137 mmol/L (ref 135–145)

## 2018-05-25 MED ORDER — DOCUSATE SODIUM 100 MG PO CAPS: 100.0000 mg | ORAL_CAPSULE | Freq: Two times a day (BID) | ORAL | 0 refills | Status: DC

## 2018-05-25 MED ORDER — METHOCARBAMOL 500 MG PO TABS: 500.0000 mg | ORAL_TABLET | Freq: Four times a day (QID) | ORAL | 0 refills | Status: DC | PRN

## 2018-05-25 MED ORDER — POLYETHYLENE GLYCOL 3350 17 G PO PACK: 17.0000 g | PACK | Freq: Two times a day (BID) | ORAL | 0 refills | Status: DC

## 2018-05-25 MED ORDER — FERROUS SULFATE 325 (65 FE) MG PO TABS: 325.0000 mg | ORAL_TABLET | Freq: Three times a day (TID) | ORAL | 3 refills | Status: DC

## 2018-05-25 MED ORDER — HYDROCODONE-ACETAMINOPHEN 7.5-325 MG PO TABS: 1.0000 | ORAL_TABLET | ORAL | 0 refills | Status: DC | PRN

## 2018-05-25 NOTE — Progress Notes (Signed)
Patient discharged home w/ wife. Given all belongings, instructions, equipment. Verbalized understanding of all instructions. Escorted to pov via w/c.

## 2018-05-25 NOTE — Progress Notes (Signed)
     Subjective: 1 Day Post-Op Procedure(s) (LRB): RIGHT TOTAL KNEE ARTHROPLASTY (Right)   Patient reports pain as mild, pain controlled. No events throughout the night.  Feels that he is doing well and looking forward to improving.  Plan for the patient to be d/c'ed home today if he does well with PT.    Patient's anticipated LOS is less than 2 midnights, meeting these requirements: - Lives within 1 hour of care - Has a competent adult at home to recover with post-op recover - NO history of  - Diabetes  - Heart failure  - Heart attack  - Stroke  - Renal failure  - Anemia  - Advanced Liver disease    Objective:   VITALS:   Vitals:   05/25/18 0147 05/25/18 0536  BP: (!) 142/83 (!) 144/94  Pulse: 81 75  Resp: 16 16  Temp:  97.7 F (36.5 C)  SpO2: 99% 100%    Dorsiflexion/Plantar flexion intact Incision: dressing C/D/I No cellulitis present Compartment soft  LABS Recent Labs    05/25/18 0330  HGB 10.7*  HCT 33.1*  WBC 11.1*  PLT 144*    Recent Labs    05/25/18 0330  NA 137  K 3.5  BUN 18  CREATININE 0.74  GLUCOSE 119*     Assessment/Plan: 1 Day Post-Op Procedure(s) (LRB): RIGHT TOTAL KNEE ARTHROPLASTY (Right) Foley cath d/c'ed Advance diet Up with therapy D/C IV fluids Discharge home Follow up in 2 weeks at [facility] [place] [facility] Follow up with OLIN,Sofia Jaquith D in 2 weeks.  Contact information:  [facility]) [address]. Trenda Corliss   PAC  05/25/2018, 8:11 AM

## 2018-05-25 NOTE — Progress Notes (Signed)
Physical Therapy Treatment Patient Details Name: Brandon [name]. MRN: [id number] DOB: 1950/09/22 Today's Date: 05/25/2018    History of Present Illness 68 yo male s/p R TKA 05/24/18. Hx of L TKA    PT Comments    Progressing well with mobility. Reviewed exercises, gait training, and stair training. Issued HEP for pt to perform 3x/day until he begins OP PT. All education completed. Okay to d/c from PT standpoint-made RN aware.     Follow Up Recommendations  Follow surgeon's recommendation for DC plan and follow-up therapies     Equipment Recommendations       Recommendations for Other Services       Precautions / Restrictions Precautions Precautions: Fall;Knee Restrictions Weight Bearing Restrictions: No Other Position/Activity Restrictions: WBAT    Mobility  Bed Mobility Overal bed mobility: Modified Independent Bed Mobility: Supine to Sit;Sit to Supine              Transfers Overall transfer level: Needs assistance Equipment used: Rolling walker (2 wheeled) Transfers: Sit to/from Stand Sit to Stand: Supervision;From elevated surface         General transfer comment: VCs safety, technique, hand /LE placement.   Ambulation/Gait Ambulation/Gait assistance: Min guard Gait Distance (Feet): 100 Feet Assistive device: Rolling walker (2 wheeled) Gait Pattern/deviations: Step-to pattern;Trunk flexed     General Gait Details: VCs safety, technique, sequence. Slow gait speed.    Stairs Stairs: Yes Min guard Stair Management: One rail Left;Step to pattern;With cane Number of Stairs: 5 General stair comments: Practiced with 1 cane, 1 rail. VCs safety, technique, sequence. Close guard for safety. Encouraged pt to leave 1 walker downstairs and 1 walker upstairs so he doesn't have to worry about having to transport a walker each time he ascends/descends steps.    Wheelchair Mobility    Modified Rankin (Stroke Patients Only)       Balance Overall balance  assessment: Mild deficits observed, not formally tested           Standing balance-Leahy Scale: Poor                              Cognition Arousal/Alertness: Awake/alert Behavior During Therapy: WFL for tasks assessed/performed Overall Cognitive Status: Within Functional Limits for tasks assessed                                        Exercises Total Joint Exercises Ankle Circles/Pumps: AROM;Both;10 reps;Supine Quad Sets: AROM;Both;10 reps;Supine Heel Slides: AAROM;Right;10 reps;Supine Straight Leg Raises: AROM;Right;10 reps;Supine Long Arc Quad: AROM;Right;10 reps;Seated Knee Flexion: AAROM;Right;10 reps;Seated Goniometric ROM: ~10-80 degrees    General Comments        Pertinent Vitals/Pain Pain Assessment: 0-10 Pain Score: 7  Pain Location: R knee Pain Descriptors / Indicators: Aching;Sore Pain Intervention(s): Ice applied;Patient requesting pain meds-RN notified    Home Living                      Prior Function            PT Goals (current goals can now be found in the care plan section) Progress towards PT goals: Progressing toward goals    Frequency    7X/week      PT Plan Current plan remains appropriate    Co-evaluation  AM-PAC PT "6 Clicks" Mobility   Outcome Measure  Help needed turning from your back to your side while in a flat bed without using bedrails?: A Little Help needed moving from lying on your back to sitting on the side of a flat bed without using bedrails?: A Little Help needed moving to and from a bed to a chair (including a wheelchair)?: A Little Help needed standing up from a chair using your arms (e.g., wheelchair or bedside chair)?: A Little Help needed to walk in hospital room?: A Little Help needed climbing 3-5 steps with a railing? : A Little 6 Click Score: 18    End of Session Equipment Utilized During Treatment: Gait belt Activity Tolerance: Patient tolerated  treatment well Patient left: in bed;with call bell/phone within reach   PT Visit Diagnosis: Pain;Other abnormalities of gait and mobility (R26.89) Pain - Right/Left: Right Pain - part of body: Knee     Time: 8372-9021 PT Time Calculation (min) (ACUTE ONLY): 33 min  Charges:  $Gait Training: 8-22 mins $Therapeutic Exercise: 8-22 mins                        Weston Anna, PT Acute Rehabilitation Services Pager: [phone] Office: [phone]

## 2018-05-26 NOTE — Discharge Summary (Signed)
Physician Discharge Summary  Patient ID: Brandon [name]. MRN: [id number] DOB/AGE: May 27, 1950 68 y.o.  Admit date: 05/24/2018 Discharge date: 05/25/2018   Procedures:  Procedure(s) (LRB): RIGHT TOTAL KNEE ARTHROPLASTY (Right)  Attending Physician:  Dr. Paralee Cancel   Admission Diagnoses:   Right knee primary OA / pain  Discharge Diagnoses:  Active Problems:   S/P right total knee arthroplasty   Osteoarthritis of right knee  Past Medical History:  Diagnosis Date  . Anxiety   . Arthritis   . Benign neoplasm of colon   . Benign prostatic hyperplasia with urinary obstruction   . Bilateral pulmonary embolism ([place]) 3/14   admitted to [facility],  treated with Xarelto  . Cataract   . Chronic pain   . History of kidney stones   . History of pulmonary embolism 03/19/2012  . Hyperlipemia   . Hypertension   . Insomnia   . Major depressive disorder, recurrent episode ([place])   . Mitral regurgitation 04/24/2013   Mild by TEE  . Obesity   . OSA (obstructive sleep apnea)    noncompliant with CPAP.  07/27/13- awaiting a CPAP- unable to tolerate mask  . Osteoporosis   . Persistent atrial fibrillation   . Restless leg syndrome    takes gabapentin  . Sciatica   . SVT (supraventricular tachycardia) ([place]) 03/07/2016  . Thrombus of left atrial appendage 03/09/2013  . Ventral hernia, unspecified, without mention of obstruction or gangrene    right abdominal wall    HPI:    Brandon [name]., 68 y.o. male, has a history of pain and functional disability in the right knee due to arthritis and has failed non-surgical conservative treatments for greater than 12 weeks to include NSAID's and/or analgesics, corticosteriod injections, use of assistive devices and activity modification.  Onset of symptoms was gradual, starting 1+ years ago with gradually worsening course since that time. The patient noted no past surgery on the right knee(s).  Patient currently rates pain in the right knee(s) at 8 out of  10 with activity. Patient has night pain, worsening of pain with activity and weight bearing, pain that interferes with activities of daily living, pain with passive range of motion, crepitus and joint swelling.  Patient has evidence of periarticular osteophytes and joint space narrowing by imaging studies.  There is no active infection.  Risks, benefits and expectations were discussed with the patient.  Risks including but not limited to the risk of anesthesia, blood clots, nerve damage, blood vessel damage, failure of the prosthesis, infection and up to and including death.  Patient understand the risks, benefits and expectations and wishes to proceed with surgery.   PCP: [place] Bing, DO   Discharged Condition: good  Hospital Course:  Patient underwent the above stated procedure on 05/24/2018. Patient tolerated the procedure well and brought to the recovery room in good condition and subsequently to the floor.  POD #1 BP: 144/94 ; Pulse: 75 ; Temp: 97.7 F (36.5 C) ; Resp: 16 Patient reports pain as mild, pain controlled. No events throughout the night.  Feels that he is doing well and looking forward to improving.  Plan for the patient to be d/c'ed home today. Dorsiflexion/plantar flexion intact, incision: dressing C/D/I, no cellulitis present and compartment soft.   LABS  Basename    HGB     10.7  HCT     33.1    Discharge Exam: General appearance: alert, cooperative and no distress Extremities: Homans sign is  negative, no sign of DVT, no edema, redness or tenderness in the calves or thighs and no ulcers, gangrene or trophic changes  Disposition:  Home with follow up in 2 weeks   Follow-up Information    Paralee Cancel, MD In 2 weeks.   Specialty:  Orthopedic Surgery Contact information: [address] [place] [address] [phone]           Discharge Instructions    Call MD / Call 911   Complete by:  As directed    If you experience chest pain or  shortness of breath, CALL 911 and be transported to the hospital emergency room.  If you develope a fever above 101 F, pus (white drainage) or increased drainage or redness at the wound, or calf pain, call your surgeon's office.   Change dressing   Complete by:  As directed    Maintain surgical dressing until follow up in the clinic. If the edges start to pull up, may reinforce with tape. If the dressing is no longer working, may remove and cover with gauze and tape, but must keep the area dry and clean.  Call with any questions or concerns.   Constipation Prevention   Complete by:  As directed    Drink plenty of fluids.  Prune juice may be helpful.  You may use a stool softener, such as Colace (over the counter) 100 mg twice a day.  Use MiraLax (over the counter) for constipation as needed.   Diet - low sodium heart healthy   Complete by:  As directed    Discharge instructions   Complete by:  As directed    Maintain surgical dressing until follow up in the clinic. If the edges start to pull up, may reinforce with tape. If the dressing is no longer working, may remove and cover with gauze and tape, but must keep the area dry and clean.  Follow up in 2 weeks at [facility]. Call with any questions or concerns.   Increase activity slowly as tolerated   Complete by:  As directed    Weight bearing as tolerated with assist device (walker, cane, etc) as directed, use it as long as suggested by your surgeon or therapist, typically at least 4-6 weeks.   TED hose   Complete by:  As directed    Use stockings (TED hose) for 2 weeks on both leg(s).  You may remove them at night for sleeping.      Allergies as of 05/25/2018      Reactions   Ace Inhibitors Cough   Albuterol Other (See Comments)   Racing heart      Medication List    STOP taking these medications   diphenhydramine-acetaminophen 25-500 MG Tabs tablet Commonly known as:  TYLENOL PM   enoxaparin 120 MG/0.8ML injection  Commonly known as:  Lovenox     TAKE these medications   amLODipine 5 MG tablet Commonly known as:  NORVASC TAKE 1 TABLET BY MOUTH EVERY DAY What changed:  when to take this   apixaban 5 MG Tabs tablet Commonly known as:  ELIQUIS Take 1 tablet (5 mg total) by mouth 2 (two) times daily.   atorvastatin 40 MG tablet Commonly known as:  LIPITOR TAKE 1 TABLET BY MOUTH EVERY DAY   carvedilol 25 MG tablet Commonly known as:  COREG TAKE 1 TABLET (25 MG TOTAL) BY MOUTH 2 (TWO) TIMES DAILY WITH A MEAL.   docusate sodium 100 MG capsule Commonly known as:  Colace Take 1 capsule (100 mg total) by mouth 2 (two) times daily.   escitalopram 20 MG tablet Commonly known as:  LEXAPRO TAKE 1 TABLET BY MOUTH EVERY DAY   ferrous sulfate 325 (65 FE) MG tablet Commonly known as:  FerrouSul Take 1 tablet (325 mg total) by mouth 3 (three) times daily with meals.   gabapentin 800 MG tablet Commonly known as:  NEURONTIN TAKE 1 TABLET (800 MG TOTAL) BY MOUTH 2 (TWO) TIMES DAILY. What changed:    when to take this  additional instructions   HYDROcodone-acetaminophen 7.5-325 MG tablet Commonly known as:  Norco Take 1-2 tablets by mouth every 4 (four) hours as needed for moderate pain.   losartan 100 MG tablet Commonly known as:  COZAAR TAKE 1 TABLET BY MOUTH EVERY DAY   methocarbamol 500 MG tablet Commonly known as:  Robaxin Take 1 tablet (500 mg total) by mouth every 6 (six) hours as needed for muscle spasms.   polyethylene glycol 17 g packet Commonly known as:  MIRALAX / GLYCOLAX Take 17 g by mouth 2 (two) times daily.   psyllium 58.6 % powder Commonly known as:  METAMUCIL Take 1 packet by mouth daily.   spironolactone 25 MG tablet Commonly known as:  ALDACTONE TAKE 1 TABLET BY MOUTH EVERY DAY   tamsulosin 0.4 MG Caps capsule Commonly known as:  FLOMAX TAKE 1 CAPSULE (0.4 MG TOTAL) BY MOUTH 2 (TWO) TIMES DAILY.            Discharge Care Instructions  (From admission,  onward)         Start     Ordered   05/25/18 0000  Change dressing    Comments:  Maintain surgical dressing until follow up in the clinic. If the edges start to pull up, may reinforce with tape. If the dressing is no longer working, may remove and cover with gauze and tape, but must keep the area dry and clean.  Call with any questions or concerns.   05/25/18 2876           Signed: [place]. Johnthan Axtman   PA-C  05/26/2018, 8:03 AM

## 2018-05-27 DIAGNOSIS — M25661 Stiffness of right knee, not elsewhere classified: Secondary | ICD-10-CM | POA: Diagnosis not present

## 2018-05-31 DIAGNOSIS — M25661 Stiffness of right knee, not elsewhere classified: Secondary | ICD-10-CM | POA: Diagnosis not present

## 2018-06-03 DIAGNOSIS — M25561 Pain in right knee: Secondary | ICD-10-CM | POA: Diagnosis not present

## 2018-06-06 DIAGNOSIS — M25661 Stiffness of right knee, not elsewhere classified: Secondary | ICD-10-CM | POA: Diagnosis not present

## 2018-06-08 ENCOUNTER — Other Ambulatory Visit: Payer: Self-pay | Admitting: Internal Medicine

## 2018-06-08 DIAGNOSIS — M25661 Stiffness of right knee, not elsewhere classified: Secondary | ICD-10-CM | POA: Diagnosis not present

## 2018-06-08 NOTE — Telephone Encounter (Signed)
Eliquis 5mg  refill request received; pt is 68 yrs old, wt-124.1kg, Crea-0.74 on 05/25/2018, last seen by Roderic Palau on 03/10/2018; will send in refill.

## 2018-06-10 DIAGNOSIS — M25661 Stiffness of right knee, not elsewhere classified: Secondary | ICD-10-CM | POA: Diagnosis not present

## 2018-06-13 DIAGNOSIS — M25661 Stiffness of right knee, not elsewhere classified: Secondary | ICD-10-CM | POA: Diagnosis not present

## 2018-06-17 DIAGNOSIS — M25661 Stiffness of right knee, not elsewhere classified: Secondary | ICD-10-CM | POA: Diagnosis not present

## 2018-06-21 DIAGNOSIS — M25661 Stiffness of right knee, not elsewhere classified: Secondary | ICD-10-CM | POA: Diagnosis not present

## 2018-06-23 DIAGNOSIS — M25661 Stiffness of right knee, not elsewhere classified: Secondary | ICD-10-CM | POA: Diagnosis not present

## 2018-06-28 DIAGNOSIS — M25661 Stiffness of right knee, not elsewhere classified: Secondary | ICD-10-CM | POA: Diagnosis not present

## 2018-06-30 DIAGNOSIS — M25661 Stiffness of right knee, not elsewhere classified: Secondary | ICD-10-CM | POA: Diagnosis not present

## 2018-07-05 ENCOUNTER — Other Ambulatory Visit: Payer: Self-pay | Admitting: Family Medicine

## 2018-07-05 DIAGNOSIS — G2581 Restless legs syndrome: Secondary | ICD-10-CM

## 2018-07-06 DIAGNOSIS — M25661 Stiffness of right knee, not elsewhere classified: Secondary | ICD-10-CM | POA: Diagnosis not present

## 2018-07-10 ENCOUNTER — Emergency Department (HOSPITAL_COMMUNITY)
Admission: EM | Admit: 2018-07-10 | Discharge: 2018-07-11 | Disposition: A | Discharge status: Home / Self Care | Payer: Medicare Other | Attending: Emergency Medicine | Admitting: Emergency Medicine

## 2018-07-10 ENCOUNTER — Emergency Department (HOSPITAL_COMMUNITY): Payer: Medicare Other

## 2018-07-10 ENCOUNTER — Other Ambulatory Visit: Payer: Self-pay

## 2018-07-10 DIAGNOSIS — Z87891 Personal history of nicotine dependence: Secondary | ICD-10-CM | POA: Insufficient documentation

## 2018-07-10 DIAGNOSIS — Z96653 Presence of artificial knee joint, bilateral: Secondary | ICD-10-CM | POA: Insufficient documentation

## 2018-07-10 DIAGNOSIS — R0602 Shortness of breath: Secondary | ICD-10-CM | POA: Diagnosis not present

## 2018-07-10 DIAGNOSIS — R002 Palpitations: Secondary | ICD-10-CM | POA: Diagnosis not present

## 2018-07-10 DIAGNOSIS — J80 Acute respiratory distress syndrome: Secondary | ICD-10-CM | POA: Diagnosis not present

## 2018-07-10 DIAGNOSIS — I10 Essential (primary) hypertension: Secondary | ICD-10-CM | POA: Diagnosis not present

## 2018-07-10 DIAGNOSIS — Z79899 Other long term (current) drug therapy: Secondary | ICD-10-CM | POA: Diagnosis not present

## 2018-07-10 DIAGNOSIS — Z7901 Long term (current) use of anticoagulants: Secondary | ICD-10-CM | POA: Diagnosis not present

## 2018-07-10 DIAGNOSIS — I4891 Unspecified atrial fibrillation: Secondary | ICD-10-CM | POA: Diagnosis not present

## 2018-07-10 DIAGNOSIS — R0902 Hypoxemia: Secondary | ICD-10-CM | POA: Diagnosis not present

## 2018-07-10 DIAGNOSIS — J45909 Unspecified asthma, uncomplicated: Secondary | ICD-10-CM | POA: Insufficient documentation

## 2018-07-10 DIAGNOSIS — R110 Nausea: Secondary | ICD-10-CM | POA: Diagnosis not present

## 2018-07-10 DIAGNOSIS — I959 Hypotension, unspecified: Secondary | ICD-10-CM | POA: Diagnosis not present

## 2018-07-10 LAB — BASIC METABOLIC PANEL
Anion gap: 10 (ref 5–15)
BUN: 11 mg/dL (ref 8–23)
CO2: 22 mmol/L (ref 22–32)
Calcium: 9.1 mg/dL (ref 8.9–10.3)
Chloride: 106 mmol/L (ref 98–111)
Creatinine, Ser: 1.15 mg/dL (ref 0.61–1.24)
GFR calc Af Amer: >60 mL/min (ref >60)
GFR calc non Af Amer: >60 mL/min (ref >60)
Glucose, Bld: 131 mg/dL — ABNORMAL HIGH (ref 70–99)
Potassium: 3.2 mmol/L — ABNORMAL LOW (ref 3.5–5.1)
Sodium: 138 mmol/L (ref 135–145)

## 2018-07-10 LAB — CBC
HCT: 41 % (ref 39.0–52.0)
Hemoglobin: 13.7 g/dL (ref 13.0–17.0)
MCH: 29.6 pg (ref 26.0–34.0)
MCHC: 33.4 g/dL (ref 30.0–36.0)
MCV: 88.6 fL (ref 80.0–100.0)
Platelets: 166 10*3/uL (ref 150–400)
RBC: 4.63 MIL/uL (ref 4.22–5.81)
RDW: 13.1 % (ref 11.5–15.5)
WBC: 9.3 10*3/uL (ref 4.0–10.5)
nRBC: 0 % (ref 0.0–0.2)

## 2018-07-10 MED ORDER — POTASSIUM CHLORIDE CRYS ER 20 MEQ PO TBCR
40.0000 meq | EXTENDED_RELEASE_TABLET | Freq: Once | ORAL | Status: AC
  Administered 2018-07-10: 40 meq via ORAL
  Filled 2018-07-10: qty 2

## 2018-07-10 MED ORDER — SODIUM CHLORIDE 0.9% FLUSH: 3.0000 mL | Freq: Once | INTRAVENOUS | Status: DC

## 2018-07-10 MED ORDER — SODIUM CHLORIDE 0.9 % IV BOLUS
1000.0000 mL | Freq: Once | INTRAVENOUS | Status: AC
  Administered 2018-07-10: 1000 mL via INTRAVENOUS

## 2018-07-10 NOTE — ED Provider Notes (Addendum)
[facility] EMERGENCY DEPARTMENT Provider Note   CSN: [id number] Arrival date & time: 07/10/18  2024    History   Chief Complaint Chief Complaint  Patient presents with  . Weakness    HPI Brandon Sprong. is a 68 y.o. male.     The history is provided by the patient.  Palpitations Palpitations quality:  Fast Onset quality:  Sudden Progression:  Resolved Chronicity:  Recurrent Context comment:  Pt found to be in afib with RVR with EMS, given IV fluids, appears back in sinus rhythmn  Relieved by: fluids. Worsened by:  Nothing Associated symptoms: near-syncope (lightheaded)   Associated symptoms: no back pain, no chest pain, no cough, no lower extremity edema, no shortness of breath and no vomiting   Risk factors: hx of atrial fibrillation and hx of PE (on blood thinners)     Past Medical History:  Diagnosis Date  . Anxiety   . Arthritis   . Benign neoplasm of colon   . Benign prostatic hyperplasia with urinary obstruction   . Bilateral pulmonary embolism ([place]) 3/14   admitted to [facility],  treated with Xarelto  . Cataract   . Chronic pain   . History of kidney stones   . History of pulmonary embolism 03/19/2012  . Hyperlipemia   . Hypertension   . Insomnia   . Major depressive disorder, recurrent episode ([place])   . Mitral regurgitation 04/24/2013   Mild by TEE  . Obesity   . OSA (obstructive sleep apnea)    noncompliant with CPAP.  07/27/13- awaiting a CPAP- unable to tolerate mask  . Osteoporosis   . Persistent atrial fibrillation   . Restless leg syndrome    takes gabapentin  . Sciatica   . SVT (supraventricular tachycardia) ([place]) 03/07/2016  . Thrombus of left atrial appendage 03/09/2013  . Ventral hernia, unspecified, without mention of obstruction or gangrene    right abdominal wall    Patient Active Problem List   Diagnosis Date Noted  . S/P right total knee arthroplasty 05/24/2018  . Osteoarthritis of right knee 05/24/2018  .  Insomnia due to medical condition 12/23/2016  . Diverticulosis of colon without hemorrhage 12/02/2016  . BPH (benign prostatic hyperplasia) 10/21/2016  . History of kidney stones 10/21/2016  . Essential hypertension 10/21/2016  . MDD (major depressive disorder) 10/21/2016  . Obesity (BMI 30-39.9) 03/07/2016  . History of alcohol abuse 03/07/2016  . History of colon polyps 11/18/2015  . A-fib ([place]) 07/30/2015  . Paroxysmal atrial fibrillation ([facility])   . Restless legs syndrome 02/08/2014  . Generalized anxiety disorder 12/12/2013  . Recurrent unilateral inguinal hernia 07/17/2013  . Persistent atrial fibrillation 04/26/2013  . Nonrheumatic mitral valve insufficiency 04/25/2013  . Encounter for therapeutic drug monitoring 04/04/2013  . Current use of long term anticoagulation   . Obstructive sleep apnea 03/09/2013  . Back pain 03/21/2012  . HLD (hyperlipidemia) 03/21/2012  . Arthralgia of hip 03/21/2012  . History of pulmonary embolism 03/19/2012  . Bilateral inguinal hernia 05/29/2011  . Uncomplicated asthma 74/82/7078  . Anxiety state 07/18/2007    Past Surgical History:  Procedure Laterality Date  . CARDIOVERSION N/A 03/21/2012   Procedure: CARDIOVERSION;  Surgeon: Birdie Riddle, MD;  Location: [facility] ENDOSCOPY;  Service: Cardiovascular;  Laterality: N/A;  . CARDIOVERSION N/A 04/24/2013   Procedure: CARDIOVERSION;  Surgeon: Dorothy Spark, MD;  Location: [facility] ENDOSCOPY;  Service: Cardiovascular;  Laterality: N/A;  . CARDIOVERSION N/A 06/27/2015   Procedure: CARDIOVERSION;  Surgeon:  Larey Dresser, MD;  Location: [place];  Service: Cardiovascular;  Laterality: N/A;  . CATARACT EXTRACTION    . COLONOSCOPY W/ POLYPECTOMY    . ELECTROPHYSIOLOGIC STUDY N/A 07/30/2015   Procedure: Atrial Fibrillation Ablation;  Surgeon: Thompson Grayer, MD;  Location: [place] CV LAB;  Service: Cardiovascular;  Laterality: N/A;  . EXTRACORPOREAL SHOCK WAVE LITHOTRIPSY Right 10/29/2016   Procedure:  RIGHT EXTRACORPOREAL SHOCK WAVE LITHOTRIPSY (ESWL);  Surgeon: Alexis Frock, MD;  Location: [facility] ORS;  Service: Urology;  Laterality: Right;  . EYE SURGERY Right    cataract  . FEMUR FRACTURE SURGERY    . HERNIA REPAIR  07/06/2011  . INGUINAL HERNIA REPAIR Right 07/28/2013   Procedure: RIGHT INGUINAL HERNIA REPAIR;  Surgeon: Imogene Burn. Georgette Dover, MD;  Location: [place];  Service: General;  Laterality: Right;  . INGUINAL HERNIA REPAIR Right 09/22/2016   Procedure: LAPAROSCOPIC RIGHT INGUINAL HERNIA;  Surgeon: Kinsinger, Arta Bruce, MD;  Location: [facility] ORS;  Service: General;  Laterality: Right;  With MESH  . INSERTION OF MESH Right 07/28/2013   Procedure: INSERTION OF MESH;  Surgeon: Imogene Burn. Georgette Dover, MD;  Location: [place];  Service: General;  Laterality: Right;  . KNEE ARTHROSCOPY     left  . REPLACEMENT TOTAL KNEE Left   . ROTATOR CUFF REPAIR     right  . ROTATOR CUFF REPAIR Left 03/08/2014   DR SUPPLE  . SHOULDER ARTHROSCOPY WITH ROTATOR CUFF REPAIR AND SUBACROMIAL DECOMPRESSION Left 03/08/2014   Procedure: LEFT SHOULDER ARTHROSCOPY WITH ROTATOR CUFF REPAIR/SUBACROMIAL DECOMPRESSION/DISTAL CLAVICLE RESECTION;  Surgeon: Marin Shutter, MD;  Location: [place];  Service: Orthopedics;  Laterality: Left;  . TEE WITHOUT CARDIOVERSION N/A 03/21/2012   Procedure: TRANSESOPHAGEAL ECHOCARDIOGRAM (TEE);  Surgeon: Birdie Riddle, MD;  Location: [place];  Service: Cardiovascular;  Laterality: N/A;  . TEE WITHOUT CARDIOVERSION N/A 04/24/2013   Procedure: TRANSESOPHAGEAL ECHOCARDIOGRAM (TEE);  Surgeon: Dorothy Spark, MD;  Location: [place];  Service: Cardiovascular;  Laterality: N/A;  . TEE WITHOUT CARDIOVERSION N/A 07/29/2015   Procedure: TRANSESOPHAGEAL ECHOCARDIOGRAM (TEE);  Surgeon: Fay Records, MD;  Location: [facility] ENDOSCOPY;  Service: Cardiovascular;  Laterality: N/A;  . TONSILLECTOMY    . TOTAL KNEE ARTHROPLASTY Right 05/24/2018   Procedure: RIGHT TOTAL KNEE ARTHROPLASTY;  Surgeon: Paralee Cancel, MD;   Location: [facility];  Service: Orthopedics;  Laterality: Right;  70 mins        Home Medications    Prior to Admission medications   Medication Sig Start Date End Date Taking? Authorizing Provider  amLODipine (NORVASC) 5 MG tablet TAKE 1 TABLET BY MOUTH EVERY DAY Patient taking differently: Take 5 mg by mouth every evening.  01/12/18   [place] Bing, DO  atorvastatin (LIPITOR) 40 MG tablet TAKE 1 TABLET BY MOUTH EVERY DAY Patient taking differently: Take 40 mg by mouth daily.  10/21/17   [place] Bing, DO  carvedilol (COREG) 25 MG tablet TAKE 1 TABLET (25 MG TOTAL) BY MOUTH 2 (TWO) TIMES DAILY WITH A MEAL. 11/25/17   [place] Bing, DO  docusate sodium (COLACE) 100 MG capsule Take 1 capsule (100 mg total) by mouth 2 (two) times daily. 05/25/18   Danae Orleans, PA-C  ELIQUIS 5 MG TABS tablet TAKE 1 TABLET BY MOUTH TWICE A DAY 06/08/18   Allred, Jeneen Rinks, MD  escitalopram (LEXAPRO) 20 MG tablet TAKE 1 TABLET BY MOUTH EVERY DAY 03/29/18   [place] Bing, DO  ferrous sulfate (FERROUSUL) 325 (65 FE) MG tablet Take 1 tablet (  325 mg total) by mouth 3 (three) times daily with meals. 05/25/18   Danae Orleans, PA-C  gabapentin (NEURONTIN) 800 MG tablet Take 1 tablet (800 mg total) by mouth See admin instructions. Takes 1 tablet in the evening and 1 tablet at bedtime. 07/05/18   [place] Bing, DO  HYDROcodone-acetaminophen (NORCO) 7.5-325 MG tablet Take 1-2 tablets by mouth every 4 (four) hours as needed for moderate pain. 05/25/18   Danae Orleans, PA-C  losartan (COZAAR) 100 MG tablet TAKE 1 TABLET BY MOUTH EVERY DAY Patient taking differently: Take 100 mg by mouth daily.  12/14/17   Allred, Jeneen Rinks, MD  methocarbamol (ROBAXIN) 500 MG tablet Take 1 tablet (500 mg total) by mouth every 6 (six) hours as needed for muscle spasms. 05/25/18   Danae Orleans, PA-C  polyethylene glycol (MIRALAX / GLYCOLAX) 17 g packet Take 17 g by mouth 2 (two) times daily. 05/25/18   Danae Orleans, PA-C  psyllium  (METAMUCIL) 58.6 % powder Take 1 packet by mouth daily.    [provider]  spironolactone (ALDACTONE) 25 MG tablet TAKE 1 TABLET BY MOUTH EVERY DAY Patient taking differently: Take 25 mg by mouth daily.  03/18/18   Allred, Jeneen Rinks, MD  tamsulosin (FLOMAX) 0.4 MG CAPS capsule TAKE 1 CAPSULE (0.4 MG TOTAL) BY MOUTH 2 (TWO) TIMES DAILY. 12/13/17   [place] Bing, DO    Family History Family History  Problem Relation Age of Onset  . Cancer Father        bone  . Heart failure Mother   . Hypertension Mother   . Asthma Mother   . Cancer Paternal Grandmother        ovarian  . CVA Other        Fam Hx of multiple myeloma  . Diabetes Other        Fam Hx of DM    Social History Social History   Tobacco Use  . Smoking status: Former Smoker    Packs/day: 0.50    Years: 5.00    Pack years: 2.50    Types: Cigarettes    Quit date: 1976    Years since quitting: 44.5  . Smokeless tobacco: Never Used  Substance Use Topics  . Alcohol use: No    Comment: Former EtOH abuse, stopped 06/2016  . Drug use: No    Comment: negative hx for IV drug abuse     Allergies   Ace inhibitors and Albuterol   Review of Systems Review of Systems  Constitutional: Negative for chills and fever.  HENT: Negative for ear pain and sore throat.   Eyes: Negative for pain and visual disturbance.  Respiratory: Negative for cough and shortness of breath.   Cardiovascular: Positive for palpitations and near-syncope (lightheaded). Negative for chest pain.  Gastrointestinal: Negative for abdominal pain and vomiting.  Genitourinary: Negative for dysuria and hematuria.  Musculoskeletal: Negative for arthralgias and back pain.  Skin: Negative for color change and rash.  Neurological: Positive for light-headedness. Negative for seizures and syncope.  All other systems reviewed and are negative.    Physical Exam Updated Vital Signs  ED Triage Vitals  Enc Vitals Group     BP 07/10/18 2030 105/75      Pulse Rate 07/10/18 2030 (!) 102     Resp 07/10/18 2030 15     Temp 07/10/18 2029 98.8 F (37.1 C)     Temp Source 07/10/18 2029 Oral     SpO2 07/10/18 2030 95 %  Weight 07/10/18 2030 280 lb (127 kg)     Height 07/10/18 2030 _0  (1.93 m)     Head Circumference --      Peak Flow --      Pain Score 07/10/18 2029 0     Pain Loc --      Pain Edu? --      Excl. in [place]? --     Physical Exam Vitals signs and nursing note reviewed.  Constitutional:      General: He is not in acute distress.    Appearance: He is well-developed. He is not ill-appearing.  HENT:     Head: Normocephalic and atraumatic.     Nose: Nose normal.     Mouth/Throat:     Mouth: Mucous membranes are moist.     Pharynx: No oropharyngeal exudate or posterior oropharyngeal erythema.  Eyes:     Extraocular Movements: Extraocular movements intact.     Conjunctiva/sclera: Conjunctivae normal.     Pupils: Pupils are equal, round, and reactive to light.  Neck:     Musculoskeletal: Normal range of motion and neck supple.  Cardiovascular:     Rate and Rhythm: Normal rate and regular rhythm.     Pulses: Normal pulses.     Heart sounds: Normal heart sounds. No murmur.  Pulmonary:     Effort: Pulmonary effort is normal. No respiratory distress.     Breath sounds: Normal breath sounds.  Abdominal:     General: There is no distension.     Palpations: Abdomen is soft.     Tenderness: There is no abdominal tenderness.  Musculoskeletal: Normal range of motion.  Skin:    General: Skin is warm and dry.     Capillary Refill: Capillary refill takes less than 2 seconds.  Neurological:     General: No focal deficit present.     Mental Status: He is alert.  Psychiatric:        Mood and Affect: Mood normal.      ED Treatments / Results  Labs (all labs ordered are listed, but only abnormal results are displayed) Labs Reviewed  BASIC METABOLIC PANEL - Abnormal; Notable for the following components:      Result Value    Potassium 3.2 (*)    Glucose, Bld 131 (*)    All other components within normal limits  CBC    EKG EKG Interpretation  Date/Time:  Sunday July 10 2018 20:28:29 EDT Ventricular Rate:  103 PR Interval:    QRS Duration: 97 QT Interval:  336 QTC Calculation: 440 R Axis:   -72 Text Interpretation:  Sinus tachycardia Borderline prolonged PR interval Left anterior fascicular block Confirmed by Lennice Sites [phone]) on 07/10/2018 8:35:14 PM   Radiology Dg Chest Portable 1 View  Result Date: 07/10/2018 CLINICAL DATA:  Atrial fibrillation EXAM: PORTABLE CHEST 1 VIEW COMPARISON:  Chest radiograph 12/27/2016 FINDINGS: Monitoring leads overlie the patient. Stable cardiac and mediastinal contours. No consolidative pulmonary opacities. No pleural effusion or pneumothorax. IMPRESSION: No acute cardiopulmonary process. Electronically Signed   By: Lovey Newcomer M.D.   On: 07/10/2018 20:57    Procedures Procedures (including critical care time)  Medications Ordered in ED Medications  sodium chloride 0.9 % bolus 1,000 mL (0 mLs Intravenous Stopped 07/10/18 2320)  potassium chloride SA (K-DUR) CR tablet 40 mEq (40 mEq Oral Given 07/10/18 2141)     Initial Impression / Assessment and Plan / ED Course  I have reviewed the triage vital signs and the  nursing notes.  Pertinent labs & imaging results that were available during my care of the patient were reviewed by me and considered in my medical decision making (see chart for details).     Brandon Steward. is a 68 year old male with history of paroxysmal atrial fibrillation on Coreg, Eliquis, PE history who presents to the ED with palpitations.  Patient was found to be in atrial fibrillation with RVR with EMS but after IV fluids appears to have resolved.  Patient was sinus rhythm upon arrival.  He states that symptoms have now resolved.  He felt dizzy when it started.  Lab work showed no significant anemia, electrolyte abnormality, kidney injury.  Patient  felt better after IV fluids.  At this time shared decision was made to discharge patient home.  He was actually given potassium repletion.  Recommend follow-up with cardiology as symptoms occurred again.  Discharged in good condition.  Patient did not have chest pain or shortness of breath.  Doubt any acute cardiac or pulmonary process.  Chest x-ray showed no signs of pneumonia, pneumothorax, pleural effusion.  Patient while waiting for his ride in the waiting room felt another episode of palpitations.  He was brought back for repeat EKG that showed sinus rhythm in the 90s.  No ischemic changes.  Blood pressure and vitals were normal.  Patient was given reassurance.  Felt better, was able to ambulate without any issues.  Recommend that he follow-up with his cardiologist tomorrow.  This chart was dictated using voice recognition software.  Despite best efforts to proofread,  errors can occur which can change the documentation meaning.    Final Clinical Impressions(s) / ED Diagnoses   Final diagnoses:  Palpitations    ED Discharge Orders    None       Lennice Sites, DO 07/10/18 [place], [place], DO 07/11/18 0008

## 2018-07-10 NOTE — ED Triage Notes (Signed)
Pt. BIB EMS d/t A fib HR 157. Reported feeling weak, nausea, and SOB.  Last episode last year. Hx ablation 2 years ago.   Triage HR 105 ST   States continued nausea, weakness, and denies SOB

## 2018-07-10 NOTE — ED Notes (Signed)
Discharge instructions discussed with pt. Pt. verbalized understanding. Pt to go home with uber.   Signature pad not available

## 2018-07-11 NOTE — ED Notes (Signed)
Pt just discharged and was sitting outside on bench waiting for taxi and felt like his Afib returned.  Pt brought back to triage room and EKG completed.  Dr. Ronnald Nian to triage and re-evaluated pt and reviewed EKG.  States pt can go home and will return if he feels afib returns.

## 2018-07-12 ENCOUNTER — Telehealth (HOSPITAL_COMMUNITY): Payer: Self-pay | Admitting: *Deleted

## 2018-07-12 ENCOUNTER — Other Ambulatory Visit: Payer: Self-pay

## 2018-07-12 ENCOUNTER — Encounter (HOSPITAL_COMMUNITY): Payer: Self-pay | Admitting: Physician Assistant

## 2018-07-12 ENCOUNTER — Ambulatory Visit (HOSPITAL_COMMUNITY)
Admission: RE | Admit: 2018-07-12 | Discharge: 2018-07-12 | Disposition: A | Discharge status: Home / Self Care | Payer: Medicare Other | Source: Ambulatory Visit | Attending: Physician Assistant | Admitting: Physician Assistant

## 2018-07-12 ENCOUNTER — Telehealth: Payer: Self-pay | Admitting: Internal Medicine

## 2018-07-12 VITALS — BP 114/74 | HR 97 | Ht 76.0 in | Wt 274.0 lb

## 2018-07-12 DIAGNOSIS — F339 Major depressive disorder, recurrent, unspecified: Secondary | ICD-10-CM | POA: Diagnosis not present

## 2018-07-12 DIAGNOSIS — E785 Hyperlipidemia, unspecified: Secondary | ICD-10-CM | POA: Diagnosis not present

## 2018-07-12 DIAGNOSIS — Z87891 Personal history of nicotine dependence: Secondary | ICD-10-CM | POA: Diagnosis not present

## 2018-07-12 DIAGNOSIS — G4733 Obstructive sleep apnea (adult) (pediatric): Secondary | ICD-10-CM | POA: Diagnosis not present

## 2018-07-12 DIAGNOSIS — I471 Supraventricular tachycardia: Secondary | ICD-10-CM | POA: Insufficient documentation

## 2018-07-12 DIAGNOSIS — I10 Essential (primary) hypertension: Secondary | ICD-10-CM | POA: Diagnosis not present

## 2018-07-12 DIAGNOSIS — Z8249 Family history of ischemic heart disease and other diseases of the circulatory system: Secondary | ICD-10-CM | POA: Insufficient documentation

## 2018-07-12 DIAGNOSIS — G2581 Restless legs syndrome: Secondary | ICD-10-CM | POA: Insufficient documentation

## 2018-07-12 DIAGNOSIS — E876 Hypokalemia: Secondary | ICD-10-CM | POA: Insufficient documentation

## 2018-07-12 DIAGNOSIS — I480 Paroxysmal atrial fibrillation: Secondary | ICD-10-CM | POA: Diagnosis not present

## 2018-07-12 DIAGNOSIS — Z7901 Long term (current) use of anticoagulants: Secondary | ICD-10-CM | POA: Insufficient documentation

## 2018-07-12 DIAGNOSIS — Z888 Allergy status to other drugs, medicaments and biological substances status: Secondary | ICD-10-CM | POA: Diagnosis not present

## 2018-07-12 DIAGNOSIS — F419 Anxiety disorder, unspecified: Secondary | ICD-10-CM | POA: Insufficient documentation

## 2018-07-12 DIAGNOSIS — Z86711 Personal history of pulmonary embolism: Secondary | ICD-10-CM | POA: Diagnosis not present

## 2018-07-12 DIAGNOSIS — Z825 Family history of asthma and other chronic lower respiratory diseases: Secondary | ICD-10-CM | POA: Diagnosis not present

## 2018-07-12 DIAGNOSIS — Z79899 Other long term (current) drug therapy: Secondary | ICD-10-CM | POA: Diagnosis not present

## 2018-07-12 LAB — BASIC METABOLIC PANEL
Anion gap: 11 (ref 5–15)
BUN: 12 mg/dL (ref 8–23)
CO2: 25 mmol/L (ref 22–32)
Calcium: 9.6 mg/dL (ref 8.9–10.3)
Chloride: 103 mmol/L (ref 98–111)
Creatinine, Ser: 0.94 mg/dL (ref 0.61–1.24)
GFR calc Af Amer: >60 mL/min (ref >60)
GFR calc non Af Amer: >60 mL/min (ref >60)
Glucose, Bld: 103 mg/dL — ABNORMAL HIGH (ref 70–99)
Potassium: 3.8 mmol/L (ref 3.5–5.1)
Sodium: 139 mmol/L (ref 135–145)

## 2018-07-12 LAB — TSH: TSH: 2.407 u[IU]/mL (ref 0.350–4.500)

## 2018-07-12 NOTE — Telephone Encounter (Signed)
duplicate

## 2018-07-12 NOTE — Telephone Encounter (Signed)
Talked with patient. Appt made in afib clinic today for ER follow up.

## 2018-07-12 NOTE — Telephone Encounter (Signed)
  Patient went to the ER on 07/10/18 for Afib and he was told to call Dr Rayann Heman and make him aware. Patient is also stating that he is still having the dizziness. He says anytime he tries to walk any distance he gets dizzy and has to stop and sit down. He also states he has a bit of nausea and has no appetite. He said they gave him potassium in the ER because it was low. Please advise.

## 2018-07-12 NOTE — Progress Notes (Signed)
Primary Care Physician: Danna Hefty, DO Referring Physician: Dr. Raynaldo Opitz Lazarus [name] is a 68 y.o. male with a h/o paroxsyamal atrial fibrillation with prior ablation in 2017 and previously treated with amiodarone. Patient reports that over the last two weeks, he has had dizziness, SOB, fatigue, loss of appetite, and heat/cold intolerance. On 07/10/18 he called EMS with complaints of heart racing and per report, was in afib but had converted to SR by the time he presented to the ER. Of note, his K+ was 3.2. He continues to have the symptoms mentioned above despite being in SR today. Patient admits that he has not been using his oral device for his OSA since his knee surgery.   Today, he denies symptoms of chest pain, orthopnea, PND, lower extremity edema, presyncope, syncope, or neurologic sequela. The patient is tolerating medications without difficulties and is otherwise without complaint today.   Past Medical History:  Diagnosis Date  . Anxiety   . Arthritis   . Benign neoplasm of colon   . Benign prostatic hyperplasia with urinary obstruction   . Bilateral pulmonary embolism ([place]) 3/14   admitted to [facility],  treated with Xarelto  . Cataract   . Chronic pain   . History of kidney stones   . History of pulmonary embolism 03/19/2012  . Hyperlipemia   . Hypertension   . Insomnia   . Major depressive disorder, recurrent episode ([place])   . Mitral regurgitation 04/24/2013   Mild by TEE  . Obesity   . OSA (obstructive sleep apnea)    noncompliant with CPAP.  07/27/13- awaiting a CPAP- unable to tolerate mask  . Osteoporosis   . Persistent atrial fibrillation   . Restless leg syndrome    takes gabapentin  . Sciatica   . SVT (supraventricular tachycardia) ([place]) 03/07/2016  . Thrombus of left atrial appendage 03/09/2013  . Ventral hernia, unspecified, without mention of obstruction or gangrene    right abdominal wall   Past Surgical History:  Procedure Laterality Date   . CARDIOVERSION N/A 03/21/2012   Procedure: CARDIOVERSION;  Surgeon: Birdie Riddle, MD;  Location: [facility] ENDOSCOPY;  Service: Cardiovascular;  Laterality: N/A;  . CARDIOVERSION N/A 04/24/2013   Procedure: CARDIOVERSION;  Surgeon: Dorothy Spark, MD;  Location: [facility] ENDOSCOPY;  Service: Cardiovascular;  Laterality: N/A;  . CARDIOVERSION N/A 06/27/2015   Procedure: CARDIOVERSION;  Surgeon: Larey Dresser, MD;  Location: [place];  Service: Cardiovascular;  Laterality: N/A;  . CATARACT EXTRACTION    . COLONOSCOPY W/ POLYPECTOMY    . ELECTROPHYSIOLOGIC STUDY N/A 07/30/2015   Procedure: Atrial Fibrillation Ablation;  Surgeon: Thompson Grayer, MD;  Location: [place] CV LAB;  Service: Cardiovascular;  Laterality: N/A;  . EXTRACORPOREAL SHOCK WAVE LITHOTRIPSY Right 10/29/2016   Procedure: RIGHT EXTRACORPOREAL SHOCK WAVE LITHOTRIPSY (ESWL);  Surgeon: Alexis Frock, MD;  Location: [facility] ORS;  Service: Urology;  Laterality: Right;  . EYE SURGERY Right    cataract  . FEMUR FRACTURE SURGERY    . HERNIA REPAIR  07/06/2011  . INGUINAL HERNIA REPAIR Right 07/28/2013   Procedure: RIGHT INGUINAL HERNIA REPAIR;  Surgeon: Imogene Burn. Georgette Dover, MD;  Location: [place];  Service: General;  Laterality: Right;  . INGUINAL HERNIA REPAIR Right 09/22/2016   Procedure: LAPAROSCOPIC RIGHT INGUINAL HERNIA;  Surgeon: Kinsinger, Arta Bruce, MD;  Location: [facility] ORS;  Service: General;  Laterality: Right;  With MESH  . INSERTION OF MESH Right 07/28/2013   Procedure: INSERTION OF MESH;  Surgeon: Imogene Burn. Georgette Dover, MD;  Location: [place];  Service: General;  Laterality: Right;  . KNEE ARTHROSCOPY     left  . REPLACEMENT TOTAL KNEE Left   . ROTATOR CUFF REPAIR     right  . ROTATOR CUFF REPAIR Left 03/08/2014   DR SUPPLE  . SHOULDER ARTHROSCOPY WITH ROTATOR CUFF REPAIR AND SUBACROMIAL DECOMPRESSION Left 03/08/2014   Procedure: LEFT SHOULDER ARTHROSCOPY WITH ROTATOR CUFF REPAIR/SUBACROMIAL DECOMPRESSION/DISTAL CLAVICLE RESECTION;  Surgeon:  Marin Shutter, MD;  Location: [place];  Service: Orthopedics;  Laterality: Left;  . TEE WITHOUT CARDIOVERSION N/A 03/21/2012   Procedure: TRANSESOPHAGEAL ECHOCARDIOGRAM (TEE);  Surgeon: Birdie Riddle, MD;  Location: [place];  Service: Cardiovascular;  Laterality: N/A;  . TEE WITHOUT CARDIOVERSION N/A 04/24/2013   Procedure: TRANSESOPHAGEAL ECHOCARDIOGRAM (TEE);  Surgeon: Dorothy Spark, MD;  Location: [place];  Service: Cardiovascular;  Laterality: N/A;  . TEE WITHOUT CARDIOVERSION N/A 07/29/2015   Procedure: TRANSESOPHAGEAL ECHOCARDIOGRAM (TEE);  Surgeon: Fay Records, MD;  Location: [facility] ENDOSCOPY;  Service: Cardiovascular;  Laterality: N/A;  . TONSILLECTOMY    . TOTAL KNEE ARTHROPLASTY Right 05/24/2018   Procedure: RIGHT TOTAL KNEE ARTHROPLASTY;  Surgeon: Paralee Cancel, MD;  Location: [facility] ORS;  Service: Orthopedics;  Laterality: Right;  70 mins    Current Outpatient Medications  Medication Sig Dispense Refill  . amLODipine (NORVASC) 5 MG tablet TAKE 1 TABLET BY MOUTH EVERY DAY (Patient taking differently: Take 5 mg by mouth every evening. ) 90 tablet 3  . atorvastatin (LIPITOR) 40 MG tablet TAKE 1 TABLET BY MOUTH EVERY DAY 90 tablet 3  . carvedilol (COREG) 25 MG tablet TAKE 1 TABLET (25 MG TOTAL) BY MOUTH 2 (TWO) TIMES DAILY WITH A MEAL. 180 tablet 3  . docusate sodium (COLACE) 100 MG capsule Take 1 capsule (100 mg total) by mouth 2 (two) times daily. 10 capsule 0  . ELIQUIS 5 MG TABS tablet TAKE 1 TABLET BY MOUTH TWICE A DAY 60 tablet 5  . escitalopram (LEXAPRO) 20 MG tablet TAKE 1 TABLET BY MOUTH EVERY DAY 90 tablet 3  . ferrous sulfate (FERROUSUL) 325 (65 FE) MG tablet Take 1 tablet (325 mg total) by mouth 3 (three) times daily with meals.  3  . gabapentin (NEURONTIN) 800 MG tablet Take 1 tablet (800 mg total) by mouth See admin instructions. Takes 1 tablet in the evening and 1 tablet at bedtime. 180 tablet 3  . HYDROcodone-acetaminophen (NORCO) 7.5-325 MG tablet Take 1-2 tablets by  mouth every 4 (four) hours as needed for moderate pain. 60 tablet 0  . losartan (COZAAR) 100 MG tablet TAKE 1 TABLET BY MOUTH EVERY DAY 90 tablet 3  . methocarbamol (ROBAXIN) 500 MG tablet Take 1 tablet (500 mg total) by mouth every 6 (six) hours as needed for muscle spasms. 40 tablet 0  . polyethylene glycol (MIRALAX / GLYCOLAX) 17 g packet Take 17 g by mouth 2 (two) times daily. 14 each 0  . psyllium (METAMUCIL) 58.6 % powder Take 1 packet by mouth daily.    Marland Kitchen spironolactone (ALDACTONE) 25 MG tablet TAKE 1 TABLET BY MOUTH EVERY DAY 90 tablet 1  . tamsulosin (FLOMAX) 0.4 MG CAPS capsule TAKE 1 CAPSULE (0.4 MG TOTAL) BY MOUTH 2 (TWO) TIMES DAILY. 180 capsule 3   No current facility-administered medications for this encounter.     Allergies  Allergen Reactions  . Ace Inhibitors Cough  . Albuterol Other (See Comments)    Racing heart    Social History  Socioeconomic History  . Marital status: Married    Spouse name: Not on file  . Number of children: Not on file  . Years of education: Not on file  . Highest education level: Not on file  Occupational History  . Not on file  Social Needs  . Financial resource strain: Not on file  . Food insecurity    Worry: Not on file    Inability: Not on file  . Transportation needs    Medical: Not on file    Non-medical: Not on file  Tobacco Use  . Smoking status: Former Smoker    Packs/day: 0.50    Years: 5.00    Pack years: 2.50    Types: Cigarettes    Quit date: 1976    Years since quitting: 44.5  . Smokeless tobacco: Never Used  Substance and Sexual Activity  . Alcohol use: No    Comment: Former EtOH abuse, stopped 06/2016  . Drug use: No    Comment: negative hx for IV drug abuse  . Sexual activity: Not on file  Lifestyle  . Physical activity    Days per week: Not on file    Minutes per session: Not on file  . Stress: Not on file  Relationships  . Social Herbalist on phone: Not on file    Gets together: Not on  file    Attends religious service: Not on file    Active member of club or organization: Not on file    Attends meetings of clubs or organizations: Not on file    Relationship status: Not on file  . Intimate partner violence    Fear of current or ex partner: Not on file    Emotionally abused: Not on file    Physically abused: Not on file    Forced sexual activity: Not on file  Other Topics Concern  . Not on file  Social History Narrative   Lives with wife in [place]   Retired Dietitian   Wife lives in town (still married but lives separate)   Has biological daughter in [place] ([place])    Family History  Problem Relation Age of Onset  . Cancer Father        bone  . Heart failure Mother   . Hypertension Mother   . Asthma Mother   . Cancer Paternal Grandmother        ovarian  . CVA Other        Fam Hx of multiple myeloma  . Diabetes Other        Fam Hx of DM    ROS- All systems are reviewed and negative except as per the HPI above  Physical Exam: Vitals:   07/12/18 1436  BP: 114/74  Pulse: 97  Weight: 124.3 kg  Height: 6' 4"  (1.93 m)    GEN- The patient is well appearing obese male, alert and oriented x 3 today.   HEENT-head normocephalic, atraumatic, sclera clear, conjunctiva pink, hearing intact, trachea midline. Lungs- Clear to ausculation bilaterally, normal work of breathing Heart- Regular rate and rhythm, no murmurs, rubs or gallops  GI- soft, NT, ND, + BS Extremities- no clubbing, cyanosis, or edema MS- no significant deformity or atrophy Skin- no rash or lesion Psych- euthymic mood, full affect Neuro- strength and sensation are intact   EKG- SR HR 97, PAC, LAD, PR 192, QRS 96, QTc 436  Epic records reviewed  Echo 09/24/16 - Left  ventricle: The cavity size was normal. Systolic function was   normal. The estimated ejection fraction was in the range of 60%   to 65%. Wall motion was normal; there were no regional wall   motion  abnormalities. Doppler parameters are consistent with   abnormal left ventricular relaxation (grade 1 diastolic   dysfunction). There was no evidence of elevated ventricular   filling pressure by Doppler parameters. - Aortic valve: Trileaflet; normal thickness leaflets. There was no   regurgitation. - Mitral valve: There was trivial regurgitation. - Right ventricle: The cavity size was normal. Wall thickness was   normal. Systolic function was normal. - Tricuspid valve: There was no regurgitation. - Pulmonary arteries: Systolic pressure could not be accurately   estimated. - Inferior vena cava: The vessel was normal in size. - Pericardium, extracardiac: There was no pericardial effusion   Assessment and Plan: 1. Paroxysmal atrial fibrillation S/p ablation 07/30/15 with Dr Rayann Heman. Has been on amiodarone previously. Patient has done well for quite some time with lifestyle changes. ? Symptoms related to afib has he is in SR today and his associated symptoms have been constant. Recheck Bmet. Check TSH. Continue Eliquis 5 mg BID Continue carvedilol 25 mg BID We discussed that if his afib should be more persistent in the future, we could consider repeat ablation vs AAD. We also discussed ILR to further quantify arrhythmia burden. Patient doing well avoiding EtOH.  This patients CHA2DS2-VASc Score and unadjusted Ischemic Stroke Rate (% per year) is equal to 2.2 % stroke rate/year from a score of 2  Above score calculated as 1 point each if present [CHF, HTN, DM, Vascular=MI/PAD/Aortic Plaque, Age if 65-74, or Male] Above score calculated as 2 points each if present [Age > 75, or Stroke/TIA/TE]   2. HTN Stable, no changes today.  3. OSA Patient admits that he has not been using his oral device for several months now. Encouraged him to use device nightly to help maintain SR long term.   4. SVT Adenosine sensitive. No changes today.  5. Hypokalemia K+ 3.2 at the ER. Recheck today.   6. Fatigue, heat/cold intolerance Will check TSH. Could be a contributing factor to afib. Worsening fatigue could also be 2/2 noncompliance with OSA treatment.   F/u with AF clinic as scheduled.   [facility] [address] [place], [place] [address] [phone]

## 2018-07-13 DIAGNOSIS — Z471 Aftercare following joint replacement surgery: Secondary | ICD-10-CM | POA: Diagnosis not present

## 2018-07-13 DIAGNOSIS — Z96641 Presence of right artificial hip joint: Secondary | ICD-10-CM | POA: Diagnosis not present

## 2018-07-15 ENCOUNTER — Encounter (HOSPITAL_COMMUNITY): Payer: Self-pay | Admitting: *Deleted

## 2018-07-29 ENCOUNTER — Encounter: Payer: Self-pay | Admitting: Family Medicine

## 2018-07-29 ENCOUNTER — Other Ambulatory Visit: Payer: Self-pay

## 2018-07-29 ENCOUNTER — Ambulatory Visit (INDEPENDENT_AMBULATORY_CARE_PROVIDER_SITE_OTHER): Payer: Medicare Other | Admitting: Family Medicine

## 2018-07-29 VITALS — BP 100/70 | HR 68

## 2018-07-29 DIAGNOSIS — I480 Paroxysmal atrial fibrillation: Secondary | ICD-10-CM

## 2018-07-29 DIAGNOSIS — Z96651 Presence of right artificial knee joint: Secondary | ICD-10-CM

## 2018-07-29 DIAGNOSIS — G8929 Other chronic pain: Secondary | ICD-10-CM | POA: Diagnosis not present

## 2018-07-29 DIAGNOSIS — M5440 Lumbago with sciatica, unspecified side: Secondary | ICD-10-CM | POA: Diagnosis not present

## 2018-07-29 MED ORDER — BACLOFEN 10 MG PO TABS: 10.0000 mg | ORAL_TABLET | Freq: Three times a day (TID) | ORAL | 0 refills | Status: DC | PRN

## 2018-07-29 NOTE — Progress Notes (Signed)
Subjective:   Patient ID: Brandon [name].    DOB: [birth year], 68 y.o. male   MRN: [id number]  Brandon [name]. is a 68 y.o. male here for knee and a.fib follow up.  Right Knee: Patient is s/p total right knee replacement on 5/19 by Dr. Alvan Dame without complications. He has been going to PT twice a week until his sciatica flared up then had to postpone it. He plans to restart now that his pain is improved. He notes he has good ROM, just some difficulty with full extension.   Paroxysmal A.fib: Patient has history of paroxsyamal atrial fibrillation with prior ablation in 2017 and previously treated with amiodarone. Patient seen in ED on 7/5 for symptoms of dizziness, SOB, fatigue, loss of appetite, and heat/cold intolerance. He was reportedly in a.fib that converted spontaneously to SR by presentation to ED. Labs including electrolytes WNL at that time. Patient saw cardiologist on 07/12/18. He had labs drawn and was continued on his Eliquis 5mg  BID and Carvedilol 25mg  BID. Electrolytes WNL (Na 139, Na 3.8) and TSH WNL. Denies any chest pain, SOB, orthopnea, PND, lower extremity edema, presyncope, syncope, or neurologic sequela at this time.   Chronic back pain with Sciatica:  Patient has chronic sciatica R>L that intermittently flares. He notes 2-3 weeks ago he thought he pulled something in his lower back. He treated with heat and rest which has since improved his symptoms. When it flares up he gets numbness/tingling and shooting pain down his leg. This can change from right to left leg at different flare ups. He denies any current symptoms. He has had ~3 epidural steroid injections in the past.  Lumbar MRI on 03/2012 noted for New left foraminal disc herniation at L4-5 likely to compress the left L4 nerve root. Slight increase in size of shallow disc protrusions at L2-3 and L3-4, more prominent on the right.  Review of Systems:  Per HPI.   [place], medications and smoking status reviewed.  Objective:   BP  100/70   Pulse 68   SpO2 96%  Vitals and nursing note reviewed.  General: well nourished, well developed, in no acute distress with non-toxic appearance, sitting comfortably in exam chair CV: regular rate and rhythm without murmurs, rubs, or gallops, no lower extremity edema Lungs: clear to auscultation bilaterally with normal work of breathing Abdomen: soft, non-tender, non-distended, normoactive bowel sounds Skin: warm, dry Extremities: warm and well perfused  Knee, right: - Inspection: no gross deformity. No swelling/effusion, erythema or bruising. Skin intact. Surgical site very well appearing. No signs of infection. - Palpation: no TTP - ROM: full active ROM with flexion and extension in knee and hip - Strength: 5/5 strength - Neuro/vasc: NV intact  Hips: normal ROM, negative FABER and FADIR bilaterally  Lumbar spine: - Inspection: no gross deformity or asymmetry, swelling or ecchymosis - Palpation: No TTP over the spinous processes, paraspinal muscles, or SI joints b/l - ROM: full active ROM of the lumbar spine in flexion and extension without pain - Strength: 5/5 strength of lower extremity in L4-S1 nerve root distributions b/l - Neuro: sensation intact in the L4-S1 nerve root distribution b/l, 2+ L4 and S1 reflexes - Special testing: Negative straight leg raise, negative slump, negative Stork test  Assessment & Plan:   S/P right total knee arthroplasty Doing well since surgery. Surgical site healing well.  - Patient to continue PT to improve stabilization and ROM.  - Tylenol/Ibuprofen PRN for pain - RTC as needed  Chronic bilateral low back pain with sciatica Chronic with intermittent sciatica. Currently asymptomatic. Improves with PT and rest. No acute treatment needed at this time. Rx for Baclofen provided for flare up as needed. Instructions for use discussed.  Per patient request, referral to neurosurgery placed for further evaluation. Discussed risks and limited  benefits provided by epidural steroid shots. Patient understood. He will RTC as needed.  Paroxysmal atrial fibrillation (HCC) Chronic and stable. Rate controlled on anticoagulation.  Currently asymptomatic and RRR noted on exam. Most recent labs WNL including TSH which is reassuring. At this time, will continue to monitor. Follow up with cardiologist Dr. Rayann Heman PRN - Continue Eliquis 5mg  BID and Coreg 25mg  BID  Orders Placed This Encounter  Procedures  . Ambulatory referral to Neurosurgery    Referral Priority:   Routine    Referral Type:   Surgical    Referral Reason:   Specialty Services Required    Requested Specialty:   Neurosurgery    Number of Visits Requested:   1   Meds ordered this encounter  Medications  . baclofen (LIORESAL) 10 MG tablet    Sig: Take 1 tablet (10 mg total) by mouth 3 (three) times daily as needed for muscle spasms.    Dispense:  30 each    Refill:  0    Mina Marble, DO PGY-2, [facility] 08/01/2018 1:21 PM

## 2018-07-29 NOTE — Patient Instructions (Signed)
Thank you for coming to see me today. I have placed referral to neurosurgery for a consult of your back. I have also sent in a muscle relaxer called Baclofen for when you have back pain. You can try over the counter Ibuprofen as needed or Voltaren gel (a topical NSAID) for relief as well. Make a annual visit with me for about 6-8 months.  Take care, Dr. Tarry Kos

## 2018-08-01 NOTE — Assessment & Plan Note (Addendum)
Doing well since surgery. Surgical site healing well.  - Patient to continue PT to improve stabilization and ROM.  - Tylenol/Ibuprofen PRN for pain - RTC as needed

## 2018-08-01 NOTE — Assessment & Plan Note (Addendum)
Chronic with intermittent sciatica. Currently asymptomatic. Improves with PT and rest. No acute treatment needed at this time. Rx for Baclofen provided for flare up as needed. Instructions for use discussed.  Per patient request, referral to neurosurgery placed for further evaluation. Discussed risks and limited benefits provided by epidural steroid shots. Patient understood. He will RTC as needed.

## 2018-08-01 NOTE — Assessment & Plan Note (Addendum)
Chronic and stable. Rate controlled on anticoagulation.  Currently asymptomatic and RRR noted on exam. Most recent labs WNL including TSH which is reassuring. At this time, will continue to monitor. Follow up with cardiologist Dr. Rayann Heman PRN - Continue Eliquis 5mg  BID and Coreg 25mg  BID

## 2018-08-09 DIAGNOSIS — M25661 Stiffness of right knee, not elsewhere classified: Secondary | ICD-10-CM | POA: Diagnosis not present

## 2018-08-11 DIAGNOSIS — M25661 Stiffness of right knee, not elsewhere classified: Secondary | ICD-10-CM | POA: Diagnosis not present

## 2018-09-11 ENCOUNTER — Other Ambulatory Visit: Payer: Self-pay | Admitting: Internal Medicine

## 2018-09-14 ENCOUNTER — Ambulatory Visit (HOSPITAL_COMMUNITY)
Admission: RE | Admit: 2018-09-14 | Discharge: 2018-09-14 | Disposition: A | Discharge status: Home / Self Care | Payer: Medicare Other | Source: Ambulatory Visit | Attending: Nurse Practitioner | Admitting: Nurse Practitioner

## 2018-09-14 ENCOUNTER — Other Ambulatory Visit: Payer: Self-pay

## 2018-09-14 ENCOUNTER — Encounter (HOSPITAL_COMMUNITY): Payer: Self-pay | Admitting: Nurse Practitioner

## 2018-09-14 VITALS — BP 138/88 | HR 66 | Ht 76.0 in | Wt 282.2 lb

## 2018-09-14 DIAGNOSIS — Z86718 Personal history of other venous thrombosis and embolism: Secondary | ICD-10-CM | POA: Insufficient documentation

## 2018-09-14 DIAGNOSIS — Z7901 Long term (current) use of anticoagulants: Secondary | ICD-10-CM | POA: Diagnosis not present

## 2018-09-14 DIAGNOSIS — G4733 Obstructive sleep apnea (adult) (pediatric): Secondary | ICD-10-CM | POA: Insufficient documentation

## 2018-09-14 DIAGNOSIS — Z8249 Family history of ischemic heart disease and other diseases of the circulatory system: Secondary | ICD-10-CM | POA: Diagnosis not present

## 2018-09-14 DIAGNOSIS — I480 Paroxysmal atrial fibrillation: Secondary | ICD-10-CM | POA: Diagnosis not present

## 2018-09-14 DIAGNOSIS — Z96653 Presence of artificial knee joint, bilateral: Secondary | ICD-10-CM | POA: Diagnosis not present

## 2018-09-14 DIAGNOSIS — N138 Other obstructive and reflux uropathy: Secondary | ICD-10-CM | POA: Insufficient documentation

## 2018-09-14 DIAGNOSIS — R635 Abnormal weight gain: Secondary | ICD-10-CM | POA: Insufficient documentation

## 2018-09-14 DIAGNOSIS — G4700 Insomnia, unspecified: Secondary | ICD-10-CM | POA: Diagnosis not present

## 2018-09-14 DIAGNOSIS — E785 Hyperlipidemia, unspecified: Secondary | ICD-10-CM | POA: Diagnosis not present

## 2018-09-14 DIAGNOSIS — G2581 Restless legs syndrome: Secondary | ICD-10-CM | POA: Insufficient documentation

## 2018-09-14 DIAGNOSIS — G8929 Other chronic pain: Secondary | ICD-10-CM | POA: Diagnosis not present

## 2018-09-14 DIAGNOSIS — I10 Essential (primary) hypertension: Secondary | ICD-10-CM | POA: Insufficient documentation

## 2018-09-14 DIAGNOSIS — Z86711 Personal history of pulmonary embolism: Secondary | ICD-10-CM | POA: Insufficient documentation

## 2018-09-14 DIAGNOSIS — N401 Enlarged prostate with lower urinary tract symptoms: Secondary | ICD-10-CM | POA: Diagnosis not present

## 2018-09-14 DIAGNOSIS — Z79899 Other long term (current) drug therapy: Secondary | ICD-10-CM | POA: Diagnosis not present

## 2018-09-14 DIAGNOSIS — Z87891 Personal history of nicotine dependence: Secondary | ICD-10-CM | POA: Diagnosis not present

## 2018-09-14 DIAGNOSIS — I4819 Other persistent atrial fibrillation: Secondary | ICD-10-CM | POA: Diagnosis not present

## 2018-09-14 DIAGNOSIS — Z87442 Personal history of urinary calculi: Secondary | ICD-10-CM | POA: Diagnosis not present

## 2018-09-14 DIAGNOSIS — F419 Anxiety disorder, unspecified: Secondary | ICD-10-CM | POA: Diagnosis not present

## 2018-09-14 DIAGNOSIS — Z888 Allergy status to other drugs, medicaments and biological substances status: Secondary | ICD-10-CM | POA: Diagnosis not present

## 2018-09-14 NOTE — Progress Notes (Signed)
Patient ID: Brandon [name]., male   DOB: 1950/07/31, 68 y.o.   MRN: [id number]    Primary Care Physician: Danna Hefty, DO Referring Physician: Dr. Raynaldo Opitz Lurry [name] is a 68 y.o. male with a h/o afib, s/p ablation, maintaining SR, off amiodarone. He had one episode of afib in July, none since then He has gained 20 lbs since Covid inactivity. He had rt knee replacement in May.  Today, he denies symptoms of palpitations, chest pain, shortness of breath, orthopnea, PND, lower extremity edema, dizziness, presyncope, syncope, or neurologic sequela. The patient is tolerating medications without difficulties and is otherwise without complaint today.   Past Medical History:  Diagnosis Date  . Anxiety   . Arthritis   . Benign neoplasm of colon   . Benign prostatic hyperplasia with urinary obstruction   . Bilateral pulmonary embolism ([place]) 3/14   admitted to [facility],  treated with Xarelto  . Cataract   . Chronic pain   . History of kidney stones   . History of pulmonary embolism 03/19/2012  . Hyperlipemia   . Hypertension   . Insomnia   . Major depressive disorder, recurrent episode ([place])   . Mitral regurgitation 04/24/2013   Mild by TEE  . Obesity   . OSA (obstructive sleep apnea)    noncompliant with CPAP.  07/27/13- awaiting a CPAP- unable to tolerate mask  . Osteoporosis   . Persistent atrial fibrillation   . Restless leg syndrome    takes gabapentin  . Sciatica   . SVT (supraventricular tachycardia) ([employer]) 03/07/2016  . Thrombus of left atrial appendage 03/09/2013  . Ventral hernia, unspecified, without mention of obstruction or gangrene    right abdominal wall   Past Surgical History:  Procedure Laterality Date  . CARDIOVERSION N/A 03/21/2012   Procedure: CARDIOVERSION;  Surgeon: Birdie Riddle, MD;  Location: [facility] ENDOSCOPY;  Service: Cardiovascular;  Laterality: N/A;  . CARDIOVERSION N/A 04/24/2013   Procedure: CARDIOVERSION;  Surgeon: Dorothy Spark, MD;  Location:  [facility] ENDOSCOPY;  Service: Cardiovascular;  Laterality: N/A;  . CARDIOVERSION N/A 06/27/2015   Procedure: CARDIOVERSION;  Surgeon: Larey Dresser, MD;  Location: [place];  Service: Cardiovascular;  Laterality: N/A;  . CATARACT EXTRACTION    . COLONOSCOPY W/ POLYPECTOMY    . ELECTROPHYSIOLOGIC STUDY N/A 07/30/2015   Procedure: Atrial Fibrillation Ablation;  Surgeon: Thompson Grayer, MD;  Location: [place] CV LAB;  Service: Cardiovascular;  Laterality: N/A;  . EXTRACORPOREAL SHOCK WAVE LITHOTRIPSY Right 10/29/2016   Procedure: RIGHT EXTRACORPOREAL SHOCK WAVE LITHOTRIPSY (ESWL);  Surgeon: Alexis Frock, MD;  Location: [facility] ORS;  Service: Urology;  Laterality: Right;  . EYE SURGERY Right    cataract  . FEMUR FRACTURE SURGERY    . HERNIA REPAIR  07/06/2011  . INGUINAL HERNIA REPAIR Right 07/28/2013   Procedure: RIGHT INGUINAL HERNIA REPAIR;  Surgeon: Imogene Burn. Georgette Dover, MD;  Location: [place];  Service: General;  Laterality: Right;  . INGUINAL HERNIA REPAIR Right 09/22/2016   Procedure: LAPAROSCOPIC RIGHT INGUINAL HERNIA;  Surgeon: Kinsinger, Arta Bruce, MD;  Location: [facility];  Service: General;  Laterality: Right;  With MESH  . INSERTION OF MESH Right 07/28/2013   Procedure: INSERTION OF MESH;  Surgeon: Imogene Burn. Georgette Dover, MD;  Location: [place];  Service: General;  Laterality: Right;  . KNEE ARTHROSCOPY     left  . REPLACEMENT TOTAL KNEE Left   . ROTATOR CUFF REPAIR     right  . ROTATOR  CUFF REPAIR Left 03/08/2014   DR SUPPLE  . SHOULDER ARTHROSCOPY WITH ROTATOR CUFF REPAIR AND SUBACROMIAL DECOMPRESSION Left 03/08/2014   Procedure: LEFT SHOULDER ARTHROSCOPY WITH ROTATOR CUFF REPAIR/SUBACROMIAL DECOMPRESSION/DISTAL CLAVICLE RESECTION;  Surgeon: Marin Shutter, MD;  Location: [place];  Service: Orthopedics;  Laterality: Left;  . TEE WITHOUT CARDIOVERSION N/A 03/21/2012   Procedure: TRANSESOPHAGEAL ECHOCARDIOGRAM (TEE);  Surgeon: Birdie Riddle, MD;  Location: [place];  Service: Cardiovascular;   Laterality: N/A;  . TEE WITHOUT CARDIOVERSION N/A 04/24/2013   Procedure: TRANSESOPHAGEAL ECHOCARDIOGRAM (TEE);  Surgeon: Dorothy Spark, MD;  Location: [place];  Service: Cardiovascular;  Laterality: N/A;  . TEE WITHOUT CARDIOVERSION N/A 07/29/2015   Procedure: TRANSESOPHAGEAL ECHOCARDIOGRAM (TEE);  Surgeon: Fay Records, MD;  Location: [facility] ENDOSCOPY;  Service: Cardiovascular;  Laterality: N/A;  . TONSILLECTOMY    . TOTAL KNEE ARTHROPLASTY Right 05/24/2018   Procedure: RIGHT TOTAL KNEE ARTHROPLASTY;  Surgeon: Paralee Cancel, MD;  Location: [facility];  Service: Orthopedics;  Laterality: Right;  70 mins    Current Outpatient Medications  Medication Sig Dispense Refill  . amLODipine (NORVASC) 5 MG tablet TAKE 1 TABLET BY MOUTH EVERY DAY (Patient taking differently: Take 5 mg by mouth every evening. ) 90 tablet 3  . atorvastatin (LIPITOR) 40 MG tablet TAKE 1 TABLET BY MOUTH EVERY DAY 90 tablet 3  . baclofen (LIORESAL) 10 MG tablet Take 1 tablet (10 mg total) by mouth 3 (three) times daily as needed for muscle spasms. 30 each 0  . carvedilol (COREG) 25 MG tablet TAKE 1 TABLET (25 MG TOTAL) BY MOUTH 2 (TWO) TIMES DAILY WITH A MEAL. 180 tablet 3  . ELIQUIS 5 MG TABS tablet TAKE 1 TABLET BY MOUTH TWICE A DAY 60 tablet 5  . escitalopram (LEXAPRO) 20 MG tablet TAKE 1 TABLET BY MOUTH EVERY DAY 90 tablet 3  . gabapentin (NEURONTIN) 800 MG tablet Take 1 tablet (800 mg total) by mouth See admin instructions. Takes 1 tablet in the evening and 1 tablet at bedtime. 180 tablet 3  . losartan (COZAAR) 100 MG tablet TAKE 1 TABLET BY MOUTH EVERY DAY 90 tablet 3  . spironolactone (ALDACTONE) 25 MG tablet Take 1 tablet (25 mg total) by mouth daily. Pt needs to call and make appt with provider for further refills - 1st attempt 30 tablet 0  . tamsulosin (FLOMAX) 0.4 MG CAPS capsule TAKE 1 CAPSULE (0.4 MG TOTAL) BY MOUTH 2 (TWO) TIMES DAILY. 180 capsule 3   No current facility-administered medications for this  encounter.     Allergies  Allergen Reactions  . Ace Inhibitors Cough  . Albuterol Other (See Comments)    Racing heart    Social History   Socioeconomic History  . Marital status: Married    Spouse name: Not on file  . Number of children: Not on file  . Years of education: Not on file  . Highest education level: Not on file  Occupational History  . Not on file  Social Needs  . Financial resource strain: Not on file  . Food insecurity    Worry: Not on file    Inability: Not on file  . Transportation needs    Medical: Not on file    Non-medical: Not on file  Tobacco Use  . Smoking status: Former Smoker    Packs/day: 0.50    Years: 5.00    Pack years: 2.50    Types: Cigarettes    Quit date: 1976    Years since  quitting: 44.7  . Smokeless tobacco: Never Used  Substance and Sexual Activity  . Alcohol use: No    Comment: Former EtOH abuse, stopped 06/2016  . Drug use: No    Comment: negative hx for IV drug abuse  . Sexual activity: Not on file  Lifestyle  . Physical activity    Days per week: Not on file    Minutes per session: Not on file  . Stress: Not on file  Relationships  . Social Herbalist on phone: Not on file    Gets together: Not on file    Attends religious service: Not on file    Active member of club or organization: Not on file    Attends meetings of clubs or organizations: Not on file    Relationship status: Not on file  . Intimate partner violence    Fear of current or ex partner: Not on file    Emotionally abused: Not on file    Physically abused: Not on file    Forced sexual activity: Not on file  Other Topics Concern  . Not on file  Social History Narrative   Lives with wife in [place]   Retired Dietitian   Wife lives in town (still married but lives separate)   Has biological daughter in [place] ([place])    Family History  Problem Relation Age of Onset  . Cancer Father        bone  . Heart failure  Mother   . Hypertension Mother   . Asthma Mother   . Cancer Paternal Grandmother        ovarian  . CVA Other        Fam Hx of multiple myeloma  . Diabetes Other        Fam Hx of DM    ROS- All systems are reviewed and negative except as per the HPI above  Physical Exam: Vitals:   09/14/18 0946  BP: 138/88  Pulse: 66  Weight: 128 kg  Height: 6' 4" (1.93 m)    GEN- The patient is well appearing, alert and oriented x 3 today.   Head- normocephalic, atraumatic Eyes-  Sclera clear, conjunctiva pink Ears- hearing intact Oropharynx- clear Neck- supple, no JVP Lymph- no cervical lymphadenopathy Lungs- Clear to ausculation bilaterally, normal work of breathing Heart- Regular rate and rhythm, no murmurs, rubs or gallops, PMI not laterally displaced GI- soft, NT, ND, + BS Extremities- no clubbing, cyanosis, or edema MS- no significant deformity or atrophy Skin- no rash or lesion Psych- euthymic mood, full affect Neuro- strength and sensation are intact  EKG- SR at 66 bpm, PR int 204 ms, Qrs int 106 ms, QTc 431 ms   Assessment and Plan: 1. afib Maintaining SR Off amiodarone  2. HTN Stable   3. Weight gain Increase in activity encouraged  F/u in 6 months  Butch Penny C. Carroll, [facility] [address] [place], [place] [address] [phone]

## 2018-09-23 ENCOUNTER — Other Ambulatory Visit: Payer: Self-pay

## 2018-09-23 MED ORDER — TAMSULOSIN HCL 0.4 MG PO CAPS: 0.4000 mg | ORAL_CAPSULE | Freq: Two times a day (BID) | ORAL | 3 refills | Status: DC

## 2018-09-28 DIAGNOSIS — N401 Enlarged prostate with lower urinary tract symptoms: Secondary | ICD-10-CM | POA: Diagnosis not present

## 2018-09-28 DIAGNOSIS — R351 Nocturia: Secondary | ICD-10-CM | POA: Diagnosis not present

## 2018-09-28 DIAGNOSIS — Z87442 Personal history of urinary calculi: Secondary | ICD-10-CM | POA: Diagnosis not present

## 2018-09-28 DIAGNOSIS — R311 Benign essential microscopic hematuria: Secondary | ICD-10-CM | POA: Diagnosis not present

## 2018-09-29 ENCOUNTER — Other Ambulatory Visit: Payer: Self-pay

## 2018-09-29 DIAGNOSIS — G2581 Restless legs syndrome: Secondary | ICD-10-CM

## 2018-09-30 MED ORDER — GABAPENTIN 800 MG PO TABS: 800.0000 mg | ORAL_TABLET | ORAL | 3 refills | Status: DC

## 2018-10-05 ENCOUNTER — Other Ambulatory Visit: Payer: Self-pay

## 2018-10-05 DIAGNOSIS — E782 Mixed hyperlipidemia: Secondary | ICD-10-CM

## 2018-10-05 MED ORDER — ATORVASTATIN CALCIUM 40 MG PO TABS: 40.0000 mg | ORAL_TABLET | Freq: Every day | ORAL | 3 refills | Status: DC

## 2018-10-13 ENCOUNTER — Other Ambulatory Visit: Payer: Self-pay | Admitting: Internal Medicine

## 2018-10-18 DIAGNOSIS — R351 Nocturia: Secondary | ICD-10-CM | POA: Diagnosis not present

## 2018-10-18 DIAGNOSIS — N3000 Acute cystitis without hematuria: Secondary | ICD-10-CM | POA: Diagnosis not present

## 2018-10-18 DIAGNOSIS — N401 Enlarged prostate with lower urinary tract symptoms: Secondary | ICD-10-CM | POA: Diagnosis not present

## 2018-10-22 ENCOUNTER — Other Ambulatory Visit: Payer: Self-pay | Admitting: Internal Medicine

## 2018-11-20 ENCOUNTER — Other Ambulatory Visit: Payer: Self-pay | Admitting: Internal Medicine

## 2018-12-03 ENCOUNTER — Other Ambulatory Visit: Payer: Self-pay | Admitting: Internal Medicine

## 2018-12-21 ENCOUNTER — Other Ambulatory Visit: Payer: Self-pay | Admitting: *Deleted

## 2018-12-21 MED ORDER — APIXABAN 5 MG PO TABS: 5.0000 mg | ORAL_TABLET | Freq: Two times a day (BID) | ORAL | 8 refills | Status: DC

## 2018-12-21 NOTE — Telephone Encounter (Signed)
Eliquis 5mg  refill request received, pt is 69 yrs old, weight-128kg, Crea-0.94 on 07/12/2018, Diagnosis-Afib, and last seen by Roderic Palau on 09/14/2018. Dose is appropriate based on dosing criteria. Will send in refill to requested pharmacy.

## 2018-12-22 ENCOUNTER — Other Ambulatory Visit: Payer: Self-pay | Admitting: Internal Medicine

## 2018-12-23 ENCOUNTER — Other Ambulatory Visit: Payer: Self-pay | Admitting: *Deleted

## 2018-12-23 MED ORDER — SPIRONOLACTONE 25 MG PO TABS: 25.0000 mg | ORAL_TABLET | Freq: Every day | ORAL | 1 refills | Status: DC

## 2018-12-27 ENCOUNTER — Emergency Department (HOSPITAL_COMMUNITY)
Admission: EM | Admit: 2018-12-27 | Discharge: 2018-12-27 | Disposition: A | Discharge status: Home / Self Care | Payer: Medicare Other | Attending: Emergency Medicine | Admitting: Emergency Medicine

## 2018-12-27 ENCOUNTER — Emergency Department (HOSPITAL_COMMUNITY): Payer: Medicare Other

## 2018-12-27 ENCOUNTER — Other Ambulatory Visit: Payer: Self-pay

## 2018-12-27 ENCOUNTER — Encounter (HOSPITAL_COMMUNITY): Payer: Self-pay | Admitting: Emergency Medicine

## 2018-12-27 ENCOUNTER — Telehealth (HOSPITAL_COMMUNITY): Payer: Self-pay | Admitting: *Deleted

## 2018-12-27 DIAGNOSIS — Z86711 Personal history of pulmonary embolism: Secondary | ICD-10-CM | POA: Insufficient documentation

## 2018-12-27 DIAGNOSIS — Z7901 Long term (current) use of anticoagulants: Secondary | ICD-10-CM | POA: Insufficient documentation

## 2018-12-27 DIAGNOSIS — I10 Essential (primary) hypertension: Secondary | ICD-10-CM | POA: Insufficient documentation

## 2018-12-27 DIAGNOSIS — I471 Supraventricular tachycardia: Secondary | ICD-10-CM | POA: Diagnosis not present

## 2018-12-27 DIAGNOSIS — Z87891 Personal history of nicotine dependence: Secondary | ICD-10-CM | POA: Diagnosis not present

## 2018-12-27 DIAGNOSIS — I4891 Unspecified atrial fibrillation: Secondary | ICD-10-CM | POA: Diagnosis present

## 2018-12-27 DIAGNOSIS — Z79899 Other long term (current) drug therapy: Secondary | ICD-10-CM | POA: Diagnosis not present

## 2018-12-27 LAB — CBC
HCT: 48.4 % (ref 39.0–52.0)
Hemoglobin: 16.1 g/dL (ref 13.0–17.0)
MCH: 30.3 pg (ref 26.0–34.0)
MCHC: 33.3 g/dL (ref 30.0–36.0)
MCV: 91 fL (ref 80.0–100.0)
Platelets: 233 10*3/uL (ref 150–400)
RBC: 5.32 MIL/uL (ref 4.22–5.81)
RDW: 12.7 % (ref 11.5–15.5)
WBC: 12 10*3/uL — ABNORMAL HIGH (ref 4.0–10.5)
nRBC: 0 % (ref 0.0–0.2)

## 2018-12-27 LAB — MAGNESIUM: Magnesium: 1.8 mg/dL (ref 1.7–2.4)

## 2018-12-27 LAB — BASIC METABOLIC PANEL
Anion gap: 14 (ref 5–15)
BUN: 14 mg/dL (ref 8–23)
CO2: 18 mmol/L — ABNORMAL LOW (ref 22–32)
Calcium: 9 mg/dL (ref 8.9–10.3)
Chloride: 106 mmol/L (ref 98–111)
Creatinine, Ser: 1.35 mg/dL — ABNORMAL HIGH (ref 0.61–1.24)
GFR calc Af Amer: >60 mL/min (ref >60)
GFR calc non Af Amer: 53 mL/min — ABNORMAL LOW (ref >60)
Glucose, Bld: 164 mg/dL — ABNORMAL HIGH (ref 70–99)
Potassium: 3.8 mmol/L (ref 3.5–5.1)
Sodium: 138 mmol/L (ref 135–145)

## 2018-12-27 LAB — TROPONIN I (HIGH SENSITIVITY): Troponin I (High Sensitivity): 37 ng/L — ABNORMAL HIGH (ref <18)

## 2018-12-27 MED ORDER — ASPIRIN 81 MG PO CHEW
324.0000 mg | CHEWABLE_TABLET | Freq: Once | ORAL | Status: AC
  Administered 2018-12-27: 324 mg via ORAL
  Filled 2018-12-27: qty 4

## 2018-12-27 MED ORDER — SODIUM CHLORIDE 0.9 % IV BOLUS
500.0000 mL | Freq: Once | INTRAVENOUS | Status: AC
  Administered 2018-12-27: 500 mL via INTRAVENOUS

## 2018-12-27 MED ORDER — LORAZEPAM 2 MG/ML IJ SOLN
INTRAMUSCULAR | Status: AC
  Filled 2018-12-27: qty 1

## 2018-12-27 MED ORDER — ETOMIDATE 2 MG/ML IV SOLN
INTRAVENOUS | Status: AC | PRN
  Administered 2018-12-27: 11 mg via INTRAVENOUS

## 2018-12-27 NOTE — Sedation Documentation (Signed)
Pt awake, states the chest heaviness has gone away. Altered upon awaking

## 2018-12-27 NOTE — ED Triage Notes (Signed)
Pt arrives via EMS from home with reports of waking up at 6 am and feeling his heart racing and dizziness. EMS gave 6 adenosine and cardioverted at 100 J with no success. 700 NS given. BP en route 68/38. Pt states he just feels terrible. Pt reports taking his medications and endorses chest heaviness and SOB.

## 2018-12-27 NOTE — ED Notes (Signed)
Daughter updated on phone 

## 2018-12-27 NOTE — Discharge Instructions (Signed)
If you develop recurrent, continued, or worsening chest pain, shortness of breath, fever, vomiting, abdominal or back pain, or any other new/concerning symptoms then return to the ER for evaluation.  

## 2018-12-27 NOTE — Sedation Documentation (Signed)
Cardioverted at 200J

## 2018-12-27 NOTE — Telephone Encounter (Signed)
Patient called in stating he went into AF HR in the 160s. Pt states hes too dizzy to stand unable to take his BP. Pt very anxious stating he doesn't want to go to ER due to risk of exposure of covid. Instructed to call EMS to transport to ER as he is too symptomatic to treat over the phone. Pt reluctantly stated he would call EMS for transport.

## 2018-12-27 NOTE — ED Provider Notes (Signed)
[place] EMERGENCY DEPARTMENT Provider Note   CSN: [id number] Arrival date & time: 12/27/18  1018  LEVEL 5 CAVEAT - ACUITY OF CONDITION  History Chief Complaint  Patient presents with  . Atrial Fibrillation    Stephen B Kovarik [name] is a 68 y.o. male.  HPI  68 year old male brought in by EMS for A. fib with RVR and hypotension.  History is somewhat limited as the patient seems somewhat confused.  Woke up at 6 AM feeling poorly.  He states he has been a little depressed for the last couple days but besides that has been feeling okay.  Woke up with palpitations, lightheadedness, shortness of breath and chest pain.  EMS gave adenosine with no relief with initial ECG appearing like SVT.  Blood pressure remained in the 70s so they then tried to cardiovert him with 100 J.  This did not change his rhythm more pressure.  He was given about 700 cc IV fluid.  The patient states he feels "terrible".  He is alert and oriented and endorses compliance with his meds including his Eliquis.  Past Medical History:  Diagnosis Date  . Anxiety   . Arthritis   . Benign neoplasm of colon   . Benign prostatic hyperplasia with urinary obstruction   . Bilateral pulmonary embolism ([place]) 3/14   admitted to [facility],  treated with Xarelto  . Cataract   . Chronic pain   . History of kidney stones   . History of pulmonary embolism 03/19/2012  . Hyperlipemia   . Hypertension   . Insomnia   . Major depressive disorder, recurrent episode ([place])   . Mitral regurgitation 04/24/2013   Mild by TEE  . Obesity   . OSA (obstructive sleep apnea)    noncompliant with CPAP.  07/27/13- awaiting a CPAP- unable to tolerate mask  . Osteoporosis   . Persistent atrial fibrillation ([place])   . Restless leg syndrome    takes gabapentin  . Sciatica   . SVT (supraventricular tachycardia) ([place]) 03/07/2016  . Thrombus of left atrial appendage 03/09/2013  . Ventral hernia, unspecified, without mention of obstruction  or gangrene    right abdominal wall    Patient Active Problem List   Diagnosis Date Noted  . S/P right total knee arthroplasty 05/24/2018  . Insomnia due to medical condition 12/23/2016  . BPH (benign prostatic hyperplasia) 10/21/2016  . Essential hypertension 10/21/2016  . MDD (major depressive disorder) 10/21/2016  . Obesity (BMI 30-39.9) 03/07/2016  . History of alcohol abuse 03/07/2016  . History of colon polyps 11/18/2015  . Paroxysmal atrial fibrillation (HCC)   . Restless legs syndrome 02/08/2014  . Generalized anxiety disorder 12/12/2013  . Recurrent unilateral inguinal hernia 07/17/2013  . Nonrheumatic mitral valve insufficiency 04/25/2013  . Current use of long term anticoagulation   . Obstructive sleep apnea 03/09/2013  . Chronic bilateral low back pain with sciatica 03/21/2012  . HLD (hyperlipidemia) 03/21/2012  . Arthralgia of hip 03/21/2012  . History of pulmonary embolism 03/19/2012  . Bilateral inguinal hernia 05/29/2011  . Uncomplicated asthma 78/29/5621  . Anxiety state 07/18/2007    Past Surgical History:  Procedure Laterality Date  . CARDIOVERSION N/A 03/21/2012   Procedure: CARDIOVERSION;  Surgeon: Birdie Riddle, MD;  Location: [facility] ENDOSCOPY;  Service: Cardiovascular;  Laterality: N/A;  . CARDIOVERSION N/A 04/24/2013   Procedure: CARDIOVERSION;  Surgeon: Dorothy Spark, MD;  Location: [place];  Service: Cardiovascular;  Laterality: N/A;  . CARDIOVERSION N/A 06/27/2015  Procedure: CARDIOVERSION;  Surgeon: Larey Dresser, MD;  Location: [place];  Service: Cardiovascular;  Laterality: N/A;  . CATARACT EXTRACTION    . COLONOSCOPY W/ POLYPECTOMY    . ELECTROPHYSIOLOGIC STUDY N/A 07/30/2015   Procedure: Atrial Fibrillation Ablation;  Surgeon: Thompson Grayer, MD;  Location: [place] CV LAB;  Service: Cardiovascular;  Laterality: N/A;  . EXTRACORPOREAL SHOCK WAVE LITHOTRIPSY Right 10/29/2016   Procedure: RIGHT EXTRACORPOREAL SHOCK WAVE LITHOTRIPSY  (ESWL);  Surgeon: Alexis Frock, MD;  Location: [facility] ORS;  Service: Urology;  Laterality: Right;  . EYE SURGERY Right    cataract  . FEMUR FRACTURE SURGERY    . HERNIA REPAIR  07/06/2011  . INGUINAL HERNIA REPAIR Right 07/28/2013   Procedure: RIGHT INGUINAL HERNIA REPAIR;  Surgeon: Imogene Burn. Georgette Dover, MD;  Location: [place];  Service: General;  Laterality: Right;  . INGUINAL HERNIA REPAIR Right 09/22/2016   Procedure: LAPAROSCOPIC RIGHT INGUINAL HERNIA;  Surgeon: Kinsinger, Arta Bruce, MD;  Location: [facility];  Service: General;  Laterality: Right;  With MESH  . INSERTION OF MESH Right 07/28/2013   Procedure: INSERTION OF MESH;  Surgeon: Imogene Burn. Georgette Dover, MD;  Location: [place];  Service: General;  Laterality: Right;  . KNEE ARTHROSCOPY     left  . REPLACEMENT TOTAL KNEE Left   . ROTATOR CUFF REPAIR     right  . ROTATOR CUFF REPAIR Left 03/08/2014   DR SUPPLE  . SHOULDER ARTHROSCOPY WITH ROTATOR CUFF REPAIR AND SUBACROMIAL DECOMPRESSION Left 03/08/2014   Procedure: LEFT SHOULDER ARTHROSCOPY WITH ROTATOR CUFF REPAIR/SUBACROMIAL DECOMPRESSION/DISTAL CLAVICLE RESECTION;  Surgeon: Marin Shutter, MD;  Location: [place];  Service: Orthopedics;  Laterality: Left;  . TEE WITHOUT CARDIOVERSION N/A 03/21/2012   Procedure: TRANSESOPHAGEAL ECHOCARDIOGRAM (TEE);  Surgeon: Birdie Riddle, MD;  Location: [place];  Service: Cardiovascular;  Laterality: N/A;  . TEE WITHOUT CARDIOVERSION N/A 04/24/2013   Procedure: TRANSESOPHAGEAL ECHOCARDIOGRAM (TEE);  Surgeon: Dorothy Spark, MD;  Location: [place];  Service: Cardiovascular;  Laterality: N/A;  . TEE WITHOUT CARDIOVERSION N/A 07/29/2015   Procedure: TRANSESOPHAGEAL ECHOCARDIOGRAM (TEE);  Surgeon: Fay Records, MD;  Location: [facility] ENDOSCOPY;  Service: Cardiovascular;  Laterality: N/A;  . TONSILLECTOMY    . TOTAL KNEE ARTHROPLASTY Right 05/24/2018   Procedure: RIGHT TOTAL KNEE ARTHROPLASTY;  Surgeon: Paralee Cancel, MD;  Location: [facility];  Service: Orthopedics;   Laterality: Right;  70 mins       Family History  Problem Relation Age of Onset  . Cancer Father        bone  . Heart failure Mother   . Hypertension Mother   . Asthma Mother   . Cancer Paternal Grandmother        ovarian  . CVA Other        Fam Hx of multiple myeloma  . Diabetes Other        Fam Hx of DM    Social History   Tobacco Use  . Smoking status: Former Smoker    Packs/day: 0.50    Years: 5.00    Pack years: 2.50    Types: Cigarettes    Quit date: 1976    Years since quitting: 45.0  . Smokeless tobacco: Never Used  Substance Use Topics  . Alcohol use: No    Comment: Former EtOH abuse, stopped 06/2016  . Drug use: No    Comment: negative hx for IV drug abuse    Home Medications Prior to Admission medications   Medication Sig Start Date  End Date Taking? Authorizing Provider  amLODipine (NORVASC) 5 MG tablet TAKE 1 TABLET BY MOUTH EVERY DAY Patient taking differently: Take 5 mg by mouth every evening.  01/12/18   Escanaba Bing, DO  apixaban (ELIQUIS) 5 MG TABS tablet Take 1 tablet (5 mg total) by mouth 2 (two) times daily. 12/21/18   Allred, Jeneen Rinks, MD  atorvastatin (LIPITOR) 40 MG tablet Take 1 tablet (40 mg total) by mouth daily. 10/05/18   Mullis, Kiersten P, DO  baclofen (LIORESAL) 10 MG tablet Take 1 tablet (10 mg total) by mouth 3 (three) times daily as needed for muscle spasms. 07/29/18   Mullis, Kiersten P, DO  carvedilol (COREG) 25 MG tablet TAKE 1 TABLET (25 MG TOTAL) BY MOUTH 2 (TWO) TIMES DAILY WITH A MEAL. 11/25/17   [place] Bing, DO  escitalopram (LEXAPRO) 20 MG tablet TAKE 1 TABLET BY MOUTH EVERY DAY 03/29/18    Bing, DO  gabapentin (NEURONTIN) 800 MG tablet Take 1 tablet (800 mg total) by mouth See admin instructions. Takes 1 tablet in the evening and 1 tablet at bedtime. 09/30/18   Mullis, Kiersten P, DO  losartan (COZAAR) 100 MG tablet TAKE 1 TABLET BY MOUTH EVERY DAY 12/14/17   Allred, Jeneen Rinks, MD  spironolactone (ALDACTONE) 25 MG  tablet Take 1 tablet (25 mg total) by mouth daily. 12/23/18   Allred, Jeneen Rinks, MD  tamsulosin (FLOMAX) 0.4 MG CAPS capsule Take 1 capsule (0.4 mg total) by mouth 2 (two) times daily. 09/23/18   Mullis, Kiersten P, DO    Allergies    Ace inhibitors and Albuterol  Review of Systems   Review of Systems  Unable to perform ROS: Acuity of condition    Physical Exam Updated Vital Signs BP 106/72   Pulse 90   Temp 97.7 F (36.5 C) (Oral)   Resp 17   Ht 6' 4"  (1.93 m)   Wt 129.3 kg   SpO2 98%   BMI 34.69 kg/m   Physical Exam Vitals and nursing note reviewed.  Constitutional:      Appearance: He is well-developed. He is obese. He is ill-appearing.  HENT:     Head: Normocephalic and atraumatic.     Right Ear: External ear normal.     Left Ear: External ear normal.     Nose: Nose normal.  Eyes:     General:        Right eye: No discharge.        Left eye: No discharge.  Cardiovascular:     Rate and Rhythm: Regular rhythm. Tachycardia present.     Heart sounds: Normal heart sounds.  Pulmonary:     Effort: Pulmonary effort is normal.     Breath sounds: Wheezing present.  Abdominal:     Palpations: Abdomen is soft.     Tenderness: There is no abdominal tenderness.  Musculoskeletal:     Cervical back: Neck supple.     Right lower leg: No edema.     Left lower leg: No edema.  Skin:    General: Skin is warm and dry.  Neurological:     Mental Status: He is alert and oriented to person, place, and time.  Psychiatric:        Mood and Affect: Mood is not anxious.     ED Results / Procedures / Treatments   Labs (all labs ordered are listed, but only abnormal results are displayed) Labs Reviewed  CBC - Abnormal; Notable for the following components:  Result Value   WBC 12.0 (*)    All other components within normal limits  BASIC METABOLIC PANEL - Abnormal; Notable for the following components:   CO2 18 (*)    Glucose, Bld 164 (*)    Creatinine, Ser 1.35 (*)    GFR  calc non Af Amer 53 (*)    All other components within normal limits  TROPONIN I (HIGH SENSITIVITY) - Abnormal; Notable for the following components:   Troponin I (High Sensitivity) 37 (*)    All other components within normal limits  MAGNESIUM  TROPONIN I (HIGH SENSITIVITY)    EKG EKG Interpretation  Date/Time:  Tuesday December 27 2018 10:26:42 EST Ventricular Rate:  163 PR Interval:    QRS Duration: 94 QT Interval:  297 QTC Calculation: 490 R Axis:   -76 Text Interpretation: Atrial fibrillation with rapid V-rate Left anterior fascicular block ST depression, probably rate related afib vs aflutter with regular rate Confirmed by Sherwood Gambler [phone]) on 12/27/2018 11:10:33 AM   EKG Interpretation  Date/Time:  Tuesday December 27 2018 10:33:05 EST Ventricular Rate:  90 PR Interval:    QRS Duration: 113 QT Interval:  343 QTC Calculation: 420 R Axis:   -64 Text Interpretation: Sinus rhythm Paired ventricular premature complexes Left anterior fascicular block Abnormal R-wave progression, late transition SVT resolved Confirmed by Sherwood Gambler [phone]) on 12/27/2018 11:11:10 AM       Radiology DG Chest Portable 1 View  Result Date: 12/27/2018 CLINICAL DATA:  Chest pressure and shortness of breath today. Atrial fibrillation. History of pulmonary embolism and hypertension. EXAM: PORTABLE CHEST 1 VIEW COMPARISON:  Radiographs 07/10/2018 and 12/27/2016. CT 05/03/2013. FINDINGS: 1051 hours. Two views obtained. The right costophrenic angle is excluded from both views. The heart size and mediastinal contours are stable with aortic ectasia. The visualized lungs appear clear. There is no pleural effusion or pneumothorax. Old fracture of the mid right clavicle appears stable. No acute osseous findings. IMPRESSION: No active cardiopulmonary process demonstrated. The right costophrenic angle is excluded from both views. Electronically Signed   By: Richardean Sale M.D.   On: 12/27/2018 11:07      Procedures .Sedation  Date/Time: 12/27/2018 10:44 AM Performed by: Sherwood Gambler, MD Authorized by: Sherwood Gambler, MD   Consent:    Consent obtained:  Emergent situation Universal protocol:    Immediately prior to procedure a time out was called: yes     Patient identity confirmation method:  Verbally with patient Indications:    Procedure performed:  Cardioversion   Procedure necessitating sedation performed by:  Physician performing sedation Pre-sedation assessment:    Time since last food or drink:  Unknown   NPO status caution: unable to specify NPO status     ASA classification: class 4 - patient with severe systemic disease that is a constant threat to life     Neck mobility: normal     Mouth opening:  3 or more finger widths   Thyromental distance:  4 finger widths   Mallampati score:  I - soft palate, uvula, fauces, pillars visible   Pre-sedation assessments completed and reviewed: airway patency, cardiovascular function, hydration status, mental status, nausea/vomiting, pain level, respiratory function and temperature   Immediate pre-procedure details:    Reassessment: Patient reassessed immediately prior to procedure     Reviewed: vital signs, relevant labs/tests and NPO status     Verified: bag valve mask available, emergency equipment available, intubation equipment available, IV patency confirmed, oxygen available and suction available  Procedure details (see MAR for exact dosages):    Preoxygenation:  Nasal cannula and nonrebreather mask   Sedation:  Etomidate   Intended level of sedation: deep   Intra-procedure monitoring:  Blood pressure monitoring, cardiac monitor, continuous pulse oximetry, frequent LOC assessments, frequent vital sign checks and continuous capnometry   Intra-procedure events: none     Total Provider sedation time (minutes):  15 Post-procedure details:    Attendance: Constant attendance by certified staff until patient recovered      Recovery: Patient returned to pre-procedure baseline     Post-sedation assessments completed and reviewed: airway patency, cardiovascular function, hydration status, mental status, nausea/vomiting, pain level, respiratory function and temperature     Patient is stable for discharge or admission: yes     Patient tolerance:  Tolerated well, no immediate complications Comments:     Patient had myoclonus after the procedure, though this self-terminated and he now is awake and back to baselin .Cardioversion  Date/Time: 12/27/2018 10:45 AM Performed by: Sherwood Gambler, MD Authorized by: Sherwood Gambler, MD   Consent:    Consent obtained:  Emergent situation Pre-procedure details:    Cardioversion basis:  Emergent   Rhythm:  Supraventricular tachycardia   Electrode placement:  Anterior-posterior Patient sedated: Yes. Refer to sedation procedure documentation for details of sedation.  Attempt one:    Cardioversion mode:  Synchronous   Waveform:  Biphasic   Shock (Joules):  200   Shock outcome:  Conversion to normal sinus rhythm Post-procedure details:    Patient status:  Awake   Patient tolerance of procedure:  Tolerated well, no immediate complications .Critical Care Performed by: Sherwood Gambler, MD Authorized by: Sherwood Gambler, MD   Critical care provider statement:    Critical care time (minutes):  40   Critical care time was exclusive of:  Separately billable procedures and treating other patients   Critical care was necessary to treat or prevent imminent or life-threatening deterioration of the following conditions:  Cardiac failure, shock and circulatory failure   Critical care was time spent personally by me on the following activities:  Development of treatment plan with patient or surrogate, evaluation of patient's response to treatment, examination of patient, obtaining history from patient or surrogate, review of old charts, re-evaluation of patient's condition, pulse  oximetry, ordering and review of radiographic studies, ordering and review of laboratory studies and ordering and performing treatments and interventions   (including critical care time)  Medications Ordered in ED Medications  etomidate (AMIDATE) injection (11 mg Intravenous Given 12/27/18 1031)  sodium chloride 0.9 % bolus 500 mL (500 mLs Intravenous New Bag/Given 12/27/18 1047)  aspirin chewable tablet 324 mg (324 mg Oral Given 12/27/18 1059)    ED Course  I have reviewed the triage vital signs and the nursing notes.  Pertinent labs & imaging results that were available during my care of the patient were reviewed by me and considered in my medical decision making (see chart for details).    MDM Rules/Calculators/A&P     CHA2DS2-VASc Score: 3                 Patient presents with unstable arrhythmia.  Seems narrow complex and regular, could be SVT which she has had before versus like an atrial flutter.  Either way with his pressure in the 70s, change in mental status/overall ill appearance, he was cardioverted with emergent consent.  He tolerated this pretty well and woke up in sinus rhythm and his blood pressure has normalized.  Watched in the ED and doing better.  No significant electrolyte abnormality to account for this.  States his potassium has been low in the past for this.  His troponin is nonspecific but probably from his significant tachycardia for hours.  I offered observation versus further work-up but given he is now asymptomatic after waking up he would like to go home which I think is pretty reasonable.  We discussed return precautions and need for outpatient cardiology follow-up.  Final Clinical Impression(s) / ED Diagnoses Final diagnoses:  SVT (supraventricular tachycardia) ([place])    Rx / DC Orders ED Discharge Orders    None       Sherwood Gambler, MD 12/27/18 1244

## 2018-12-27 NOTE — ED Notes (Signed)
Pt verbalized understanding of discharge paperwork and follow-up care.  °

## 2018-12-28 ENCOUNTER — Emergency Department (HOSPITAL_COMMUNITY)
Admission: EM | Admit: 2018-12-28 | Discharge: 2018-12-28 | Disposition: A | Discharge status: Home / Self Care | Payer: Medicare Other | Attending: Emergency Medicine | Admitting: Emergency Medicine

## 2018-12-28 ENCOUNTER — Other Ambulatory Visit: Payer: Self-pay

## 2018-12-28 ENCOUNTER — Emergency Department (HOSPITAL_COMMUNITY): Payer: Medicare Other

## 2018-12-28 ENCOUNTER — Encounter (HOSPITAL_COMMUNITY): Payer: Self-pay

## 2018-12-28 DIAGNOSIS — I471 Supraventricular tachycardia: Secondary | ICD-10-CM

## 2018-12-28 DIAGNOSIS — I4891 Unspecified atrial fibrillation: Secondary | ICD-10-CM | POA: Insufficient documentation

## 2018-12-28 DIAGNOSIS — I10 Essential (primary) hypertension: Secondary | ICD-10-CM | POA: Diagnosis not present

## 2018-12-28 DIAGNOSIS — Z87891 Personal history of nicotine dependence: Secondary | ICD-10-CM | POA: Diagnosis not present

## 2018-12-28 DIAGNOSIS — Z20828 Contact with and (suspected) exposure to other viral communicable diseases: Secondary | ICD-10-CM | POA: Insufficient documentation

## 2018-12-28 DIAGNOSIS — Z03818 Encounter for observation for suspected exposure to other biological agents ruled out: Secondary | ICD-10-CM | POA: Diagnosis not present

## 2018-12-28 DIAGNOSIS — Z79899 Other long term (current) drug therapy: Secondary | ICD-10-CM | POA: Diagnosis not present

## 2018-12-28 DIAGNOSIS — R42 Dizziness and giddiness: Secondary | ICD-10-CM | POA: Insufficient documentation

## 2018-12-28 DIAGNOSIS — Z86711 Personal history of pulmonary embolism: Secondary | ICD-10-CM | POA: Diagnosis not present

## 2018-12-28 DIAGNOSIS — R9431 Abnormal electrocardiogram [ECG] [EKG]: Secondary | ICD-10-CM | POA: Diagnosis not present

## 2018-12-28 DIAGNOSIS — Z7901 Long term (current) use of anticoagulants: Secondary | ICD-10-CM | POA: Diagnosis not present

## 2018-12-28 DIAGNOSIS — R002 Palpitations: Secondary | ICD-10-CM | POA: Diagnosis not present

## 2018-12-28 LAB — CBC WITH DIFFERENTIAL/PLATELET
Abs Immature Granulocytes: 0.02 10*3/uL (ref 0.00–0.07)
Basophils Absolute: 0.1 10*3/uL (ref 0.0–0.1)
Basophils Relative: 1 %
Eosinophils Absolute: 0.4 10*3/uL (ref 0.0–0.5)
Eosinophils Relative: 4 %
HCT: 43.7 % (ref 39.0–52.0)
Hemoglobin: 14.9 g/dL (ref 13.0–17.0)
Immature Granulocytes: 0 %
Lymphocytes Relative: 26 %
Lymphs Abs: 2.2 10*3/uL (ref 0.7–4.0)
MCH: 30.7 pg (ref 26.0–34.0)
MCHC: 34.1 g/dL (ref 30.0–36.0)
MCV: 89.9 fL (ref 80.0–100.0)
Monocytes Absolute: 0.8 10*3/uL (ref 0.1–1.0)
Monocytes Relative: 10 %
Neutro Abs: 5 10*3/uL (ref 1.7–7.7)
Neutrophils Relative %: 59 %
Platelets: 167 10*3/uL (ref 150–400)
RBC: 4.86 MIL/uL (ref 4.22–5.81)
RDW: 12.7 % (ref 11.5–15.5)
WBC: 8.3 10*3/uL (ref 4.0–10.5)
nRBC: 0 % (ref 0.0–0.2)

## 2018-12-28 LAB — I-STAT CHEM 8, ED
BUN: 22 mg/dL (ref 8–23)
Calcium, Ion: 1.06 mmol/L — ABNORMAL LOW (ref 1.15–1.40)
Chloride: 104 mmol/L (ref 98–111)
Creatinine, Ser: 1.3 mg/dL — ABNORMAL HIGH (ref 0.61–1.24)
Glucose, Bld: 127 mg/dL — ABNORMAL HIGH (ref 70–99)
HCT: 44 % (ref 39.0–52.0)
Hemoglobin: 15 g/dL (ref 13.0–17.0)
Potassium: 3.5 mmol/L (ref 3.5–5.1)
Sodium: 141 mmol/L (ref 135–145)
TCO2: 25 mmol/L (ref 22–32)

## 2018-12-28 LAB — BASIC METABOLIC PANEL
Anion gap: 11 (ref 5–15)
BUN: 19 mg/dL (ref 8–23)
CO2: 23 mmol/L (ref 22–32)
Calcium: 9.1 mg/dL (ref 8.9–10.3)
Chloride: 106 mmol/L (ref 98–111)
Creatinine, Ser: 1.52 mg/dL — ABNORMAL HIGH (ref 0.61–1.24)
GFR calc Af Amer: 53 mL/min — ABNORMAL LOW (ref >60)
GFR calc non Af Amer: 46 mL/min — ABNORMAL LOW (ref >60)
Glucose, Bld: 131 mg/dL — ABNORMAL HIGH (ref 70–99)
Potassium: 3.6 mmol/L (ref 3.5–5.1)
Sodium: 140 mmol/L (ref 135–145)

## 2018-12-28 LAB — TROPONIN I (HIGH SENSITIVITY)
Troponin I (High Sensitivity): 597 ng/L (ref <18)
Troponin I (High Sensitivity): 590 ng/L (ref <18)

## 2018-12-28 LAB — SARS CORONAVIRUS 2 (TAT 6-24 HRS): SARS Coronavirus 2: NEGATIVE

## 2018-12-28 MED ORDER — AMIODARONE HCL IN DEXTROSE 360-4.14 MG/200ML-% IV SOLN: 30.0000 mg/h | INTRAVENOUS | Status: DC

## 2018-12-28 MED ORDER — SODIUM CHLORIDE 0.9 % IV BOLUS
500.0000 mL | Freq: Once | INTRAVENOUS | Status: AC
  Administered 2018-12-28: 500 mL via INTRAVENOUS

## 2018-12-28 MED ORDER — AMIODARONE HCL IN DEXTROSE 360-4.14 MG/200ML-% IV SOLN
60.0000 mg/h | INTRAVENOUS | Status: DC
  Administered 2018-12-28 (×2): 60 mg/h via INTRAVENOUS
  Filled 2018-12-28 (×2): qty 200

## 2018-12-28 MED ORDER — AMIODARONE LOAD VIA INFUSION
150.0000 mg | Freq: Once | INTRAVENOUS | Status: AC
  Administered 2018-12-28: 150 mg via INTRAVENOUS
  Filled 2018-12-28: qty 83.34

## 2018-12-28 NOTE — ED Provider Notes (Addendum)
[facility] EMERGENCY DEPARTMENT Provider Note   CSN: [id number] Arrival date & time: 12/28/18  1135     History Chief Complaint  Patient presents with  . Other    SVT    [employer] [name] is a 68 y.o. male hx of A. fib on Eliquis, hypertension, here presenting with palpitations.  Patient was seen here yesterday and had cardioversion done by EMS as well as in the ED for SVT versus A. fib.  Patient went back into sinus rhythm.  Offered admission but wanted to go home and has cardiology follow-up.  This morning he was walking from the kitchen and noticed sudden onset of palpitation again.  He called EMS and was noted to have a heart rate of 170s.  Patient was given adenosine and went back into sinus rhythm.  Patient was on amiodarone previously but has been off for the last several months.  Patient also had a previous ablation as well for A. fib.  The history is provided by the patient.       Past Medical History:  Diagnosis Date  . Anxiety   . Arthritis   . Benign neoplasm of colon   . Benign prostatic hyperplasia with urinary obstruction   . Bilateral pulmonary embolism ([place]) 3/14   admitted to [facility],  treated with Xarelto  . Cataract   . Chronic pain   . History of kidney stones   . History of pulmonary embolism 03/19/2012  . Hyperlipemia   . Hypertension   . Insomnia   . Major depressive disorder, recurrent episode ([place])   . Mitral regurgitation 04/24/2013   Mild by TEE  . Obesity   . OSA (obstructive sleep apnea)    noncompliant with CPAP.  07/27/13- awaiting a CPAP- unable to tolerate mask  . Osteoporosis   . Persistent atrial fibrillation ([place])   . Restless leg syndrome    takes gabapentin  . Sciatica   . SVT (supraventricular tachycardia) ([place]) 03/07/2016  . Thrombus of left atrial appendage 03/09/2013  . Ventral hernia, unspecified, without mention of obstruction or gangrene    right abdominal wall    Patient Active Problem List   Diagnosis Date Noted  . S/P right total knee arthroplasty 05/24/2018  . Insomnia due to medical condition 12/23/2016  . BPH (benign prostatic hyperplasia) 10/21/2016  . Essential hypertension 10/21/2016  . MDD (major depressive disorder) 10/21/2016  . Obesity (BMI 30-39.9) 03/07/2016  . History of alcohol abuse 03/07/2016  . History of colon polyps 11/18/2015  . Paroxysmal atrial fibrillation (H[facility])   . Restless legs syndrome 02/08/2014  . Generalized anxiety disorder 12/12/2013  . Recurrent unilateral inguinal hernia 07/17/2013  . Nonrheumatic mitral valve insufficiency 04/25/2013  . Current use of long term anticoagulation   . Obstructive sleep apnea 03/09/2013  . Chronic bilateral low back pain with sciatica 03/21/2012  . HLD (hyperlipidemia) 03/21/2012  . Arthralgia of hip 03/21/2012  . History of pulmonary embolism 03/19/2012  . Bilateral inguinal hernia 05/29/2011  . Uncomplicated asthma 62/26/3335  . Anxiety state 07/18/2007    Past Surgical History:  Procedure Laterality Date  . CARDIOVERSION N/A 03/21/2012   Procedure: CARDIOVERSION;  Surgeon: Birdie Riddle, MD;  Location[facility] ENDOSCOPY;  Service: Cardiovascular;  Laterality: N/A;  . CARDIOVERSION N/A 04/24/2013   Procedure: CARDIOVERSION;  Surgeon: Dorothy Spark, MD;  Location: [facility] ENDOSCOPY;  Service: Cardiovascular;  Laterality: N/A;  . CARDIOVERSION N/A 06/27/2015   Procedure: CARDIOVERSION;  Surgeon: Larey Dresser,  MD;  Location: [place];  Service: Cardiovascular;  Laterality: N/A;  . CATARACT EXTRACTION    . COLONOSCOPY W/ POLYPECTOMY    . ELECTROPHYSIOLOGIC STUDY N/A 07/30/2015   Procedure: Atrial Fibrillation Ablation;  Surgeon: Thompson Grayer, MD;  Location: [place] CV LAB;  Service: Cardiovascular;  Laterality: N/A;  . EXTRACORPOREAL SHOCK WAVE LITHOTRIPSY Right 10/29/2016   Procedure: RIGHT EXTRACORPOREAL SHOCK WAVE LITHOTRIPSY (ESWL);  Surgeon: Alexis Frock, MD;  Location: [facility] ORS;  Service:  Urology;  Laterality: Right;  . EYE SURGERY Right    cataract  . FEMUR FRACTURE SURGERY    . HERNIA REPAIR  07/06/2011  . INGUINAL HERNIA REPAIR Right 07/28/2013   Procedure: RIGHT INGUINAL HERNIA REPAIR;  Surgeon: Imogene Burn. Georgette Dover, MD;  Location: [place];  Service: General;  Laterality: Right;  . INGUINAL HERNIA REPAIR Right 09/22/2016   Procedure: LAPAROSCOPIC RIGHT INGUINAL HERNIA;  Surgeon: Kinsinger, Arta Bruce, MD;  Location: [facility];  Service: General;  Laterality: Right;  With MESH  . INSERTION OF MESH Right 07/28/2013   Procedure: INSERTION OF MESH;  Surgeon: Imogene Burn. Georgette Dover, MD;  Location: [place];  Service: General;  Laterality: Right;  . KNEE ARTHROSCOPY     left  . REPLACEMENT TOTAL KNEE Left   . ROTATOR CUFF REPAIR     right  . ROTATOR CUFF REPAIR Left 03/08/2014   DR SUPPLE  . SHOULDER ARTHROSCOPY WITH ROTATOR CUFF REPAIR AND SUBACROMIAL DECOMPRESSION Left 03/08/2014   Procedure: LEFT SHOULDER ARTHROSCOPY WITH ROTATOR CUFF REPAIR/SUBACROMIAL DECOMPRESSION/DISTAL CLAVICLE RESECTION;  Surgeon: Marin Shutter, MD;  Location: [place];  Service: Orthopedics;  Laterality: Left;  . TEE WITHOUT CARDIOVERSION N/A 03/21/2012   Procedure: TRANSESOPHAGEAL ECHOCARDIOGRAM (TEE);  Surgeon: Birdie Riddle, MD;  Location: [place];  Service: Cardiovascular;  Laterality: N/A;  . TEE WITHOUT CARDIOVERSION N/A 04/24/2013   Procedure: TRANSESOPHAGEAL ECHOCARDIOGRAM (TEE);  Surgeon: Dorothy Spark, MD;  Location: [place];  Service: Cardiovascular;  Laterality: N/A;  . TEE WITHOUT CARDIOVERSION N/A 07/29/2015   Procedure: TRANSESOPHAGEAL ECHOCARDIOGRAM (TEE);  Surgeon: Fay Records, MD;  Location[facility] ENDOSCOPY;  Service: Cardiovascular;  Laterality: N/A;  . TONSILLECTOMY    . TOTAL KNEE ARTHROPLASTY Right 05/24/2018   Procedure: RIGHT TOTAL KNEE ARTHROPLASTY;  Surgeon: Paralee Cancel, MD;  Location: [facility];  Service: Orthopedics;  Laterality: Right;  70 mins       Family History  Problem Relation  Age of Onset  . Cancer Father        bone  . Heart failure Mother   . Hypertension Mother   . Asthma Mother   . Cancer Paternal Grandmother        ovarian  . CVA Other        Fam Hx of multiple myeloma  . Diabetes Other        Fam Hx of DM    Social History   Tobacco Use  . Smoking status: Former Smoker    Packs/day: 0.50    Years: 5.00    Pack years: 2.50    Types: Cigarettes    Quit date: 1976    Years since quitting: 45.0  . Smokeless tobacco: Never Used  Substance Use Topics  . Alcohol use: No    Comment: Former EtOH abuse, stopped 06/2016  . Drug use: No    Comment: negative hx for IV drug abuse    Home Medications Prior to Admission medications   Medication Sig Start Date End Date Taking? Authorizing Provider  amLODipine (  NORVASC) 5 MG tablet TAKE 1 TABLET BY MOUTH EVERY DAY Patient taking differently: Take 5 mg by mouth every evening.  01/12/18   Pease Bing, DO  apixaban (ELIQUIS) 5 MG TABS tablet Take 1 tablet (5 mg total) by mouth 2 (two) times daily. 12/21/18   Allred, Jeneen Rinks, MD  atorvastatin (LIPITOR) 40 MG tablet Take 1 tablet (40 mg total) by mouth daily. 10/05/18   Mullis, Kiersten P, DO  baclofen (LIORESAL) 10 MG tablet Take 1 tablet (10 mg total) by mouth 3 (three) times daily as needed for muscle spasms. 07/29/18   Mullis, Kiersten P, DO  carvedilol (COREG) 25 MG tablet TAKE 1 TABLET (25 MG TOTAL) BY MOUTH 2 (TWO) TIMES DAILY WITH A MEAL. 11/25/17   Greeley Bing, DO  escitalopram (LEXAPRO) 20 MG tablet TAKE 1 TABLET BY MOUTH EVERY DAY 03/29/18   Fossil Bing, DO  gabapentin (NEURONTIN) 800 MG tablet Take 1 tablet (800 mg total) by mouth See admin instructions. Takes 1 tablet in the evening and 1 tablet at bedtime. 09/30/18   Mullis, Kiersten P, DO  losartan (COZAAR) 100 MG tablet TAKE 1 TABLET BY MOUTH EVERY DAY 12/14/17   Allred, Jeneen Rinks, MD  spironolactone (ALDACTONE) 25 MG tablet Take 1 tablet (25 mg total) by mouth daily. 12/23/18   Allred,  Jeneen Rinks, MD  tamsulosin (FLOMAX) 0.4 MG CAPS capsule Take 1 capsule (0.4 mg total) by mouth 2 (two) times daily. 09/23/18   Mullis, Kiersten P, DO    Allergies    Ace inhibitors and Albuterol  Review of Systems   Review of Systems  Cardiovascular: Positive for palpitations.  Neurological: Positive for dizziness.  All other systems reviewed and are negative.   Physical Exam Updated Vital Signs BP 111/82   Pulse 79   Resp (!) 9   SpO2 97%   Physical Exam Vitals and nursing note reviewed.  HENT:     Head: Normocephalic.     Nose: Nose normal.     Mouth/Throat:     Mouth: Mucous membranes are moist.  Eyes:     Extraocular Movements: Extraocular movements intact.     Pupils: Pupils are equal, round, and reactive to light.  Cardiovascular:     Comments: Tachy, irregular  Pulmonary:     Effort: Pulmonary effort is normal.  Abdominal:     General: Abdomen is flat.     Palpations: Abdomen is soft.  Musculoskeletal:        General: Normal range of motion.     Cervical back: Normal range of motion.  Skin:    General: Skin is warm.     Capillary Refill: Capillary refill takes less than 2 seconds.  Neurological:     General: No focal deficit present.     Mental Status: He is alert and oriented to person, place, and time.  Psychiatric:        Mood and Affect: Mood normal.        Behavior: Behavior normal.     ED Results / Procedures / Treatments   Labs (all labs ordered are listed, but only abnormal results are displayed) Labs Reviewed  BASIC METABOLIC PANEL - Abnormal; Notable for the following components:      Result Value   Glucose, Bld 131 (*)    Creatinine, Ser 1.52 (*)    GFR calc non Af Amer 46 (*)    GFR calc Af Amer 53 (*)    All other components within normal limits  I-STAT  CHEM 8, ED - Abnormal; Notable for the following components:   Creatinine, Ser 1.30 (*)    Glucose, Bld 127 (*)    Calcium, Ion 1.06 (*)    All other components within normal limits    TROPONIN I (HIGH SENSITIVITY) - Abnormal; Notable for the following components:   Troponin I (High Sensitivity) 597 (*)    All other components within normal limits  SARS CORONAVIRUS 2 (TAT 6-24 HRS)  CBC WITH DIFFERENTIAL/PLATELET  TROPONIN I (HIGH SENSITIVITY)    EKG None  Radiology DG Chest Port 1 View  Result Date: 12/28/2018 CLINICAL DATA:  Irregular heartbeat beginning this morning. EXAM: PORTABLE CHEST 1 VIEW COMPARISON:  Single-view of the chest 12/27/2018. PA and lateral chest 12/27/2016. FINDINGS: Lungs are clear. Heart size is normal. No pneumothorax or pleural effusion. Remote right clavicle fracture is unchanged. No acute bony abnormality. IMPRESSION: No acute disease. Electronically Signed   By: Inge Rise M.D.   On: 12/28/2018 12:04   DG Chest Portable 1 View  Result Date: 12/27/2018 CLINICAL DATA:  Chest pressure and shortness of breath today. Atrial fibrillation. History of pulmonary embolism and hypertension. EXAM: PORTABLE CHEST 1 VIEW COMPARISON:  Radiographs 07/10/2018 and 12/27/2016. CT 05/03/2013. FINDINGS: 1051 hours. Two views obtained. The right costophrenic angle is excluded from both views. The heart size and mediastinal contours are stable with aortic ectasia. The visualized lungs appear clear. There is no pleural effusion or pneumothorax. Old fracture of the mid right clavicle appears stable. No acute osseous findings. IMPRESSION: No active cardiopulmonary process demonstrated. The right costophrenic angle is excluded from both views. Electronically Signed   By: Richardean Sale M.D.   On: 12/27/2018 11:07    Procedures Procedures (including critical care time)  CRITICAL CARE Performed by: Wandra Arthurs   Total critical care time: 30 minutes  Critical care time was exclusive of separately billable procedures and treating other patients.  Critical care was necessary to treat or prevent imminent or life-threatening deterioration.  Critical care was  time spent personally by me on the following activities: development of treatment plan with patient and/or surrogate as well as nursing, discussions with consultants, evaluation of patient's response to treatment, examination of patient, obtaining history from patient or surrogate, ordering and performing treatments and interventions, ordering and review of laboratory studies, ordering and review of radiographic studies, pulse oximetry and re-evaluation of patient's condition.   Medications Ordered in ED Medications  amiodarone (NEXTERONE) 1.8 mg/mL load via infusion 150 mg (150 mg Intravenous Bolus from Bag 12/28/18 1214)    Followed by  amiodarone (NEXTERONE PREMIX) 360-4.14 MG/200ML-% (1.8 mg/mL) IV infusion (60 mg/hr Intravenous New Bag/Given 12/28/18 1231)    Followed by  amiodarone (NEXTERONE PREMIX) 360-4.14 MG/200ML-% (1.8 mg/mL) IV infusion (has no administration in time range)  sodium chloride 0.9 % bolus 500 mL (0 mLs Intravenous Stopped 12/28/18 1425)    ED Course  I have reviewed the triage vital signs and the nursing notes.  Pertinent labs & imaging results that were available during my care of the patient were reviewed by me and considered in my medical decision making (see chart for details).    MDM Rules/Calculators/A&P     CHA2DS2-VASc Score: 3                 Brandon [employer] [name] is a 67 y.o. male here with palpitations.  Possible SVT per EMS was given adenosine x3 doses and now back in sinus rhythm. He was cardioverted twice  yesterday and likely went back into afib. Labs yesterday unremarkable. Will repeat labs and consult cardiology   1 pm Patient went back into rapid afib rate 160s. Hypotensive to 90s. Given IV amiodarone bolus and started on amiodarone drip. Trop 500 likely from demand ischemia from rapid afib. Cardiology to admit.   3:05 PM Dr. Lovena Le at bedside and states that patient is back in sinus rhythm. He request delta trop and if it is more elevated then he  will admit. Otherwise, will dc home with coreg 12.5 mg BID. Signed out to Dr. Johnney Killian to follow up delta trop.    Final Clinical Impression(s) / ED Diagnoses Final diagnoses:  Atrial fibrillation with rapid ventricular response [facility]    Rx / DC Orders ED Discharge Orders    None       Drenda Freeze, MD 12/28/18 1310    Drenda Freeze, MD 12/28/18 [phone]

## 2018-12-28 NOTE — ED Provider Notes (Signed)
Cardiology has reviewed the patient's second troponin.  They advised patient is stable for discharge from perspective of dysrhythmia.  Okay to DC amiodarone drip.  No oral medications need to be given in the emergency department.  Carvedilol prescription has been filled by cardiology.  They will be contacting the patient for follow-up treatment.   Charlesetta Shanks, MD 12/28/18 [phone]

## 2018-12-28 NOTE — ED Notes (Signed)
Dr. Darl Householder aware of troponin

## 2018-12-28 NOTE — ED Notes (Signed)
MD aware of BP. Pt cleared for DC

## 2018-12-28 NOTE — ED Notes (Signed)
Dr. Yao at bedside. 

## 2018-12-28 NOTE — ED Notes (Addendum)
Pt denies any further needs at this time, expressed understanding of discharge instructions and was able to repeat back instructions,. Pt will go to pharmacy and pick up meds called in by Cardiologist

## 2018-12-28 NOTE — Discharge Instructions (Addendum)
1.  Your cardiology group has called in your carvedilol to your pharmacy.  They will be contacting you for follow-up and ongoing treatment. 2.  Return to the emergency department if you have any concerning symptoms.

## 2018-12-28 NOTE — H&P (Addendum)
Cardiology Admission History and Physical:   Patient ID: Brandon [name]. MRN: [id number]; DOB: 16-Dec-1950   Admission date: 12/28/2018  Primary Care Provider: Danna Hefty, DO Primary Cardiologist/Electrophysiologist:  Thompson Grayer, MD   Chief Complaint:  SVT  Patient Profile:   Brandon [name]. is a 68 y.o. male with paroxysmal atrial fibrillation s/p ablation in 2017, paroxysmal supraventricular tachycardia, untreated sleep apnea, prior left atrial thrombus, history of pulmonary embolism in 2014, hypertension, prior alcohol abuse quit in 2018 and hyperlipidemia seen for evaluation of supraventricular tachycardia.  Patient has longstanding history of arrhythmia. He underwent atrial fibrillation ablation by Dr. Rayann Heman in 2017. He was previously on amiodarone which was discontinued due to age. He is adenosine sensitive for SVT.   Last echocardiogram September 2018 showed LV function of 60 to 65%, no wall motion abnormality, grade 1 diastolic dysfunction, trivial mitral regurgitation. Low risk stress test March 2018.  He was maintaining sinus rhythm when last seen in atrial fibrillation clinic September 2020.  History of Present Illness:   Brandon [name] had an episode of atrial fibrillation/SVT yesterday.  He feels poorly with dizziness and palpitation when he goes out of rhythm.  Yesterday morning he woke up with palpitation, lightheadedness and shortness of breath.  EMS found him in SVT.  He was given adenosine 6 mg and then 12 mg without relief.  Systolic blood pressure in 70s>> given fluids.  Cardioverted with 100 J however unsuccessful during enroute.  However he had a successful cardioversion in ER at 200 J.  He was observed and discharged from ER.  However he had a recurrent episode of dizziness and palpitation this morning.  EMS found him again in SVT at 171 bpm.  He was given adenosine 6 mg x 1 then 12 mg x2.  He converted to sinus rhythm after second dose of adenosine 12 mg.  However  again went into SVT/A. fib in ER and started on IV amiodarone with bolus with successful conversion. Currently maintaining sinus rhythm on IV amiodarone. Systolic blood pressure in 70s despite 500 cc of normal saline.  Will give another 500 cc bolus of normal saline.  High-sensitivity troponin 597 which is up from 67 yesterday.  Serum creatinine 1.35.  Pending Covid.  He denies exposure to Covid, cough, congestion, fever, chills, chest pain, syncope, orthopnea, PND or melena.  He drinks 1 cup of coffee and tea every day.  No excessive caffeine intake recently.  He is abstinence from alcohol since 2018.  Patient was diagnosed with sleep apnea in 2015 however unable to tolerate to mask  x2.  He is unable to sleep at night and mostly awake.  He has gained weight during pandemic due to inactivity.  He lives by himself and daughter lives in [place].  Heart Pathway Score:     Past Medical History:  Diagnosis Date  . Anxiety   . Arthritis   . Benign neoplasm of colon   . Benign prostatic hyperplasia with urinary obstruction   . Bilateral pulmonary embolism ([place]) 3/14   admitted to [facility],  treated with Xarelto  . Cataract   . Chronic pain   . History of kidney stones   . History of pulmonary embolism 03/19/2012  . Hyperlipemia   . Hypertension   . Insomnia   . Major depressive disorder, recurrent episode ([place])   . Mitral regurgitation 04/24/2013   Mild by TEE  . Obesity   . OSA (obstructive sleep apnea)  noncompliant with CPAP.  07/27/13- awaiting a CPAP- unable to tolerate mask  . Osteoporosis   . Persistent atrial fibrillation ([place])   . Restless leg syndrome    takes gabapentin  . Sciatica   . SVT (supraventricular tachycardia) ([place]) 03/07/2016  . Thrombus of left atrial appendage 03/09/2013  . Ventral hernia, unspecified, without mention of obstruction or gangrene    right abdominal wall    Past Surgical History:  Procedure Laterality Date  . CARDIOVERSION N/A 03/21/2012    Procedure: CARDIOVERSION;  Surgeon: Birdie Riddle, MD;  Location: [facility] ENDOSCOPY;  Service: Cardiovascular;  Laterality: N/A;  . CARDIOVERSION N/A 04/24/2013   Procedure: CARDIOVERSION;  Surgeon: Dorothy Spark, MD;  Location: [facility] ENDOSCOPY;  Service: Cardiovascular;  Laterality: N/A;  . CARDIOVERSION N/A 06/27/2015   Procedure: CARDIOVERSION;  Surgeon: Larey Dresser, MD;  Location: [place];  Service: Cardiovascular;  Laterality: N/A;  . CATARACT EXTRACTION    . COLONOSCOPY W/ POLYPECTOMY    . ELECTROPHYSIOLOGIC STUDY N/A 07/30/2015   Procedure: Atrial Fibrillation Ablation;  Surgeon: Thompson Grayer, MD;  Location: [place] CV LAB;  Service: Cardiovascular;  Laterality: N/A;  . EXTRACORPOREAL SHOCK WAVE LITHOTRIPSY Right 10/29/2016   Procedure: RIGHT EXTRACORPOREAL SHOCK WAVE LITHOTRIPSY (ESWL);  Surgeon: Alexis Frock, MD;  Location: [facility] ORS;  Service: Urology;  Laterality: Right;  . EYE SURGERY Right    cataract  . FEMUR FRACTURE SURGERY    . HERNIA REPAIR  07/06/2011  . INGUINAL HERNIA REPAIR Right 07/28/2013   Procedure: RIGHT INGUINAL HERNIA REPAIR;  Surgeon: Imogene Burn. Georgette Dover, MD;  Location: [place];  Service: General;  Laterality: Right;  . INGUINAL HERNIA REPAIR Right 09/22/2016   Procedure: LAPAROSCOPIC RIGHT INGUINAL HERNIA;  Surgeon: Kinsinger, Arta Bruce, MD;  Location: [facility];  Service: General;  Laterality: Right;  With MESH  . INSERTION OF MESH Right 07/28/2013   Procedure: INSERTION OF MESH;  Surgeon: Imogene Burn. Georgette Dover, MD;  Location: [place];  Service: General;  Laterality: Right;  . KNEE ARTHROSCOPY     left  . REPLACEMENT TOTAL KNEE Left   . ROTATOR CUFF REPAIR     right  . ROTATOR CUFF REPAIR Left 03/08/2014   DR SUPPLE  . SHOULDER ARTHROSCOPY WITH ROTATOR CUFF REPAIR AND SUBACROMIAL DECOMPRESSION Left 03/08/2014   Procedure: LEFT SHOULDER ARTHROSCOPY WITH ROTATOR CUFF REPAIR/SUBACROMIAL DECOMPRESSION/DISTAL CLAVICLE RESECTION;  Surgeon: Marin Shutter, MD;  Location: [place];  Service: Orthopedics;  Laterality: Left;  . TEE WITHOUT CARDIOVERSION N/A 03/21/2012   Procedure: TRANSESOPHAGEAL ECHOCARDIOGRAM (TEE);  Surgeon: Birdie Riddle, MD;  Location: [place];  Service: Cardiovascular;  Laterality: N/A;  . TEE WITHOUT CARDIOVERSION N/A 04/24/2013   Procedure: TRANSESOPHAGEAL ECHOCARDIOGRAM (TEE);  Surgeon: Dorothy Spark, MD;  Location: [place];  Service: Cardiovascular;  Laterality: N/A;  . TEE WITHOUT CARDIOVERSION N/A 07/29/2015   Procedure: TRANSESOPHAGEAL ECHOCARDIOGRAM (TEE);  Surgeon: Fay Records, MD;  Location: [facility] ENDOSCOPY;  Service: Cardiovascular;  Laterality: N/A;  . TONSILLECTOMY    . TOTAL KNEE ARTHROPLASTY Right 05/24/2018   Procedure: RIGHT TOTAL KNEE ARTHROPLASTY;  Surgeon: Paralee Cancel, MD;  Location: [facility];  Service: Orthopedics;  Laterality: Right;  70 mins     Medications Prior to Admission: Prior to Admission medications   Medication Sig Start Date End Date Taking? Authorizing Provider  amLODipine (NORVASC) 5 MG tablet TAKE 1 TABLET BY MOUTH EVERY DAY Patient taking differently: Take 5 mg by mouth every evening.  01/12/18   Ingham Bing,  DO  apixaban (ELIQUIS) 5 MG TABS tablet Take 1 tablet (5 mg total) by mouth 2 (two) times daily. 12/21/18   Allred, Jeneen Rinks, MD  atorvastatin (LIPITOR) 40 MG tablet Take 1 tablet (40 mg total) by mouth daily. 10/05/18   Mullis, Kiersten P, DO  baclofen (LIORESAL) 10 MG tablet Take 1 tablet (10 mg total) by mouth 3 (three) times daily as needed for muscle spasms. 07/29/18   Mullis, Kiersten P, DO  carvedilol (COREG) 25 MG tablet TAKE 1 TABLET (25 MG TOTAL) BY MOUTH 2 (TWO) TIMES DAILY WITH A MEAL. 11/25/17   Amity Bing, DO  escitalopram (LEXAPRO) 20 MG tablet TAKE 1 TABLET BY MOUTH EVERY DAY 03/29/18   [place] Bing, DO  gabapentin (NEURONTIN) 800 MG tablet Take 1 tablet (800 mg total) by mouth See admin instructions. Takes 1 tablet in the evening and 1 tablet at bedtime. 09/30/18    Mullis, Kiersten P, DO  losartan (COZAAR) 100 MG tablet TAKE 1 TABLET BY MOUTH EVERY DAY 12/14/17   Allred, Jeneen Rinks, MD  spironolactone (ALDACTONE) 25 MG tablet Take 1 tablet (25 mg total) by mouth daily. 12/23/18   Allred, Jeneen Rinks, MD  tamsulosin (FLOMAX) 0.4 MG CAPS capsule Take 1 capsule (0.4 mg total) by mouth 2 (two) times daily. 09/23/18   Mullis, Kiersten P, DO    Allergies:    Allergies  Allergen Reactions  . Ace Inhibitors Cough  . Albuterol Other (See Comments)    Racing heart    Social History:   Social History   Socioeconomic History  . Marital status: Married    Spouse name: Not on file  . Number of children: Not on file  . Years of education: Not on file  . Highest education level: Not on file  Occupational History  . Not on file  Tobacco Use  . Smoking status: Former Smoker    Packs/day: 0.50    Years: 5.00    Pack years: 2.50    Types: Cigarettes    Quit date: 1976    Years since quitting: 45.0  . Smokeless tobacco: Never Used  Substance and Sexual Activity  . Alcohol use: No    Comment: Former EtOH abuse, stopped 06/2016  . Drug use: No    Comment: negative hx for IV drug abuse  . Sexual activity: Not on file  Other Topics Concern  . Not on file  Social History Narrative   Lives with wife in [place]   Retired Dietitian   Wife lives in town (still married but lives separate)   Has biological daughter in [place] ([place])   Social Determinants of Health   Financial Resource Strain:   . Difficulty of Paying Living Expenses: Not on file  Food Insecurity:   . Worried About Charity fundraiser in the Last Year: Not on file  . Ran Out of Food in the Last Year: Not on file  Transportation Needs:   . Lack of Transportation (Medical): Not on file  . Lack of Transportation (Non-Medical): Not on file  Physical Activity:   . Days of Exercise per Week: Not on file  . Minutes of Exercise per Session: Not on file  Stress:   . Feeling of Stress  : Not on file  Social Connections:   . Frequency of Communication with Friends and Family: Not on file  . Frequency of Social Gatherings with Friends and Family: Not on file  . Attends Religious Services: Not  on file  . Active Member of Clubs or Organizations: Not on file  . Attends Archivist Meetings: Not on file  . Marital Status: Not on file  Intimate Partner Violence:   . Fear of Current or Ex-Partner: Not on file  . Emotionally Abused: Not on file  . Physically Abused: Not on file  . Sexually Abused: Not on file    Family History:   The patient's family history includes Asthma in his mother; CVA in an other family member; Cancer in his father and paternal grandmother; Diabetes in an other family member; Heart failure in his mother; Hypertension in his mother.    ROS:  Please see the history of present illness.  All other ROS reviewed and negative.     Physical Exam/Data:   Vitals:   12/28/18 1300 12/28/18 1303 12/28/18 1306 12/28/18 1309  BP: 110/75 101/71 111/75   Pulse: 84 82 81 87  Resp: (!) 7 (!) 9 (!) 6 (!) 9  SpO2: 95% 96% 96% 97%   No intake or output data in the 24 hours ending 12/28/18 1336 Last 3 Weights 12/27/2018 12/27/2018 09/14/2018  Weight (lbs) 285 lb 280 lb 282 lb 3.2 oz  Weight (kg) 129.275 kg 127.007 kg 128.005 kg     There is no height or weight on file to calculate BMI.  General:  Well nourished, well developed, in no acute distress HEENT: normal Lymph: no adenopathy Neck: no JVD Endocrine:  No thryomegaly Vascular: No carotid bruits; FA pulses 2+ bilaterally without bruits  Cardiac:  normal S1, S2; RRR; no murmur  Lungs:  clear to auscultation bilaterally, no wheezing, rhonchi or rales  Abd: soft, nontender, no hepatomegaly  Ext: no edema Musculoskeletal:  No deformities, BUE and BLE strength normal and equal Skin: warm and dry  Neuro:  CNs 2-12 intact, no focal abnormalities noted Psych:  Normal affect    EKG:  The ECG that was  done today was personally reviewed and demonstrates supraventricular tachycardia at rate of 171 bpm  Relevant CV Studies:   Stress test 03/2016  Nuclear stress EF: 53%.  There was no ST segment deviation noted during stress.  Defect 1: There is a large defect of severe severity present in the basal inferolateral, mid inferolateral and apex location.  This is a low risk study.  The left ventricular ejection fraction is mildly decreased (45-54%).   Low risk stress nuclear study with large prior inferolateral infarct and mild peri-infarct ischemia; EF 53 with inferolateral hypokinesis; mild LVE.   Echo 09/2016 Study Conclusions  - Left ventricle: The cavity size was normal. Systolic function was   normal. The estimated ejection fraction was in the range of 60%   to 65%. Wall motion was normal; there were no regional wall   motion abnormalities. Doppler parameters are consistent with   abnormal left ventricular relaxation (grade 1 diastolic   dysfunction). There was no evidence of elevated ventricular   filling pressure by Doppler parameters. - Aortic valve: Trileaflet; normal thickness leaflets. There was no   regurgitation. - Mitral valve: There was trivial regurgitation. - Right ventricle: The cavity size was normal. Wall thickness was   normal. Systolic function was normal. - Tricuspid valve: There was no regurgitation. - Pulmonary arteries: Systolic pressure could not be accurately   estimated. - Inferior vena cava: The vessel was normal in size. - Pericardium, extracardiac: There was no pericardial effusion. Laboratory Data:  High Sensitivity Troponin:   Recent Labs  Lab 12/27/18  1034 12/28/18 1147  [place]  Lab 12/27/18 1034 12/28/18 1147 12/28/18 1201  NA 138 140 141  K 3.8 3.6 3.5  CL 106 106 104  CO2 18* 23  --   GLUCOSE 164* 131* 127*  BUN 14 19 22   CREATININE 1.35* 1.52* 1.30*  CALCIUM 9.0 9.1  --   GFRNONAA  53* 46*  --   GFRAA >60 53*  --   ANIONGAP 14 11  --     Hematology Recent Labs  Lab 12/27/18 1034 12/28/18 1147 12/28/18 1201  WBC 12.0* 8.3  --   RBC 5.32 4.86  --   HGB 16.1 14.9 15.0  HCT 48.4 43.7 44.0  MCV 91.0 89.9  --   MCH 30.3 30.7  --   MCHC 33.3 34.1  --   RDW 12.7 12.7  --   PLT 233 167  --    Radiology/Studies:  DG Chest Port 1 View  Result Date: 12/28/2018 CLINICAL DATA:  Irregular heartbeat beginning this morning. EXAM: PORTABLE CHEST 1 VIEW COMPARISON:  Single-view of the chest 12/27/2018. PA and lateral chest 12/27/2016. FINDINGS: Lungs are clear. Heart size is normal. No pneumothorax or pleural effusion. Remote right clavicle fracture is unchanged. No acute bony abnormality. IMPRESSION: No acute disease. Electronically Signed   By: Inge Rise M.D.   On: 12/28/2018 12:04    Assessment and Plan:   1. Supraventricular tachycardia - Prior history with adenosine sensitive.  2nd  episode since yesterday. - Yesterday adenosine 6 mg x 1 >> 12 mg x 1>> 100 J shock during enroute >> successful cardioversion in ER at 200 J. - Today adenosine 6 mg x 1 >> 12 mg x 2 with successful cardioversion however had recurrent SVT or A. fib in ER which successfully cardioverted on IV amiodarone with bolus.  Currently maintaining sinus rhythm.  He was given fluids for hypotension. -He denies alcohol abuse.  Caffeine intake has been stable.  Unable to tolerate sleep apnea treatment. - Likely needs another EP study +/- long term antiarrhythmic therapy.   2.  Elevated troponin -High-sensitivity troponin 597 which is up from 41 yesterday.  -Could be demand in setting of shock and arrhythmia -Denies chest pain  3.  Untreated sleep apnea -Given recurrent arrhythmia and recent weight gain consider outpatient sleep study evaluation  4.  Hypertension with hypotension  -Blood pressure soft -Given total of 1 L bolus of NS -Hold antihypertensive regimen  5.  Paroxysmal atrial  fibrillation -S/p ablation in 2017 -On Eliquis for anticoagulation  6.  Chronic anticoagulation -History of PAF, pulmonary embolism and left atrial thrombus  7. AKI - Bolus of NS given   Severity of Illness: The appropriate patient status for this patient is OBSERVATION. Observation status is judged to be reasonable and necessary in order to provide the required intensity of service to ensure the patient's safety. The patient's presenting symptoms, physical exam findings, and initial radiographic and laboratory data in the context of their medical condition is felt to place them at decreased risk for further clinical deterioration. Furthermore, it is anticipated that the patient will be medically stable for discharge from the hospital within 2 midnights of admission. The following factors support the patient status of observation.   " The patient's presenting symptoms include Dizziness, palpitations, SOB. " The physical exam findings include non  " The initial radiographic and laboratory data are hypotension, SVT/afib, elevated troponin   For questions or  updates, please contact [place] Please consult [email or web address] for contact info under   Jarrett Soho, PA  12/28/2018 1:36 PM   EP Attending  Patient seen and examined. Agree with above. The patient presents with recurrent SVT, symptomatic, and refractory to medical therapy. He has converted to NSR on IV adenosine, then had more SVT and PAF and was placed on IV amiodarone and now returns to NSR. He has an elevation in his troponin which is almost certainly supply/demand ischemia. He feels well. His exam demonstrates a well appealing man, NAD. Vitals are stable and lungs are clear and CV with a RRR and extremities are warm. His neuro exam is non-focal. He wouldl ike to go home.   A/P 1. Recurrent SVT - the patient has been out of his beta blocker for a week. If his SVT remains quite he can be dischareged home. If his  troponin is not trending significanly higher, he can be sent home. If he has more SVT or troponin significantly higher, he should be observed overnight. He will need a prescription for coreg 12.5 bid. We will schedule him back after the first of the year for EPS/RFA of SVT.   Mikle Bosworth.D.

## 2018-12-28 NOTE — Progress Notes (Signed)
F/u trop 590  Ok to discontinue amiodarone and discharge home from ER  I have called in to his pharmacy refills for his coreg to get started back today.  I have reached out to Dr. Tanna Furry RN to contact the patient about scheduling his ablation procedure  Tommye Standard, PA-C

## 2018-12-28 NOTE — ED Notes (Signed)
Both IV removed, cath tip intact, site clean, dry and intact.

## 2018-12-29 ENCOUNTER — Telehealth: Payer: Self-pay

## 2018-12-29 NOTE — Telephone Encounter (Signed)
-----   Message from [facility], [place] sent at 12/28/2018  4:03 PM EST ----- Dr. Lovena Le saw this patient in the ER, they discussed SVT ablation.  Ultimately decided to go home and come back as an elective case.  Can you follow up on with him on getting him scheduled in the next couple weeks?  GT wants to do the case.  Please and Thanks renee

## 2019-01-04 ENCOUNTER — Encounter (HOSPITAL_COMMUNITY): Payer: Self-pay | Admitting: Nurse Practitioner

## 2019-01-04 ENCOUNTER — Other Ambulatory Visit: Payer: Self-pay

## 2019-01-04 ENCOUNTER — Ambulatory Visit (HOSPITAL_COMMUNITY)
Admission: RE | Admit: 2019-01-04 | Discharge: 2019-01-04 | Disposition: A | Discharge status: Home / Self Care | Payer: Medicare Other | Source: Ambulatory Visit | Attending: Nurse Practitioner | Admitting: Nurse Practitioner

## 2019-01-04 VITALS — BP 122/78 | HR 69 | Ht 76.0 in | Wt 302.2 lb

## 2019-01-04 DIAGNOSIS — D6869 Other thrombophilia: Secondary | ICD-10-CM

## 2019-01-04 DIAGNOSIS — I444 Left anterior fascicular block: Secondary | ICD-10-CM | POA: Insufficient documentation

## 2019-01-04 DIAGNOSIS — E669 Obesity, unspecified: Secondary | ICD-10-CM | POA: Diagnosis not present

## 2019-01-04 DIAGNOSIS — Z6836 Body mass index (BMI) 36.0-36.9, adult: Secondary | ICD-10-CM | POA: Insufficient documentation

## 2019-01-04 DIAGNOSIS — Z8041 Family history of malignant neoplasm of ovary: Secondary | ICD-10-CM | POA: Insufficient documentation

## 2019-01-04 DIAGNOSIS — I471 Supraventricular tachycardia: Secondary | ICD-10-CM | POA: Insufficient documentation

## 2019-01-04 DIAGNOSIS — I10 Essential (primary) hypertension: Secondary | ICD-10-CM | POA: Insufficient documentation

## 2019-01-04 DIAGNOSIS — I4819 Other persistent atrial fibrillation: Secondary | ICD-10-CM | POA: Diagnosis not present

## 2019-01-04 DIAGNOSIS — Z8249 Family history of ischemic heart disease and other diseases of the circulatory system: Secondary | ICD-10-CM | POA: Diagnosis not present

## 2019-01-04 DIAGNOSIS — Z833 Family history of diabetes mellitus: Secondary | ICD-10-CM | POA: Insufficient documentation

## 2019-01-04 DIAGNOSIS — G4700 Insomnia, unspecified: Secondary | ICD-10-CM | POA: Insufficient documentation

## 2019-01-04 DIAGNOSIS — G4733 Obstructive sleep apnea (adult) (pediatric): Secondary | ICD-10-CM | POA: Insufficient documentation

## 2019-01-04 DIAGNOSIS — R9431 Abnormal electrocardiogram [ECG] [EKG]: Secondary | ICD-10-CM | POA: Insufficient documentation

## 2019-01-04 DIAGNOSIS — Z96653 Presence of artificial knee joint, bilateral: Secondary | ICD-10-CM | POA: Diagnosis not present

## 2019-01-04 DIAGNOSIS — I340 Nonrheumatic mitral (valve) insufficiency: Secondary | ICD-10-CM | POA: Insufficient documentation

## 2019-01-04 DIAGNOSIS — Z888 Allergy status to other drugs, medicaments and biological substances status: Secondary | ICD-10-CM | POA: Insufficient documentation

## 2019-01-04 DIAGNOSIS — G2581 Restless legs syndrome: Secondary | ICD-10-CM | POA: Diagnosis not present

## 2019-01-04 DIAGNOSIS — G8929 Other chronic pain: Secondary | ICD-10-CM | POA: Diagnosis not present

## 2019-01-04 DIAGNOSIS — Z823 Family history of stroke: Secondary | ICD-10-CM | POA: Insufficient documentation

## 2019-01-04 DIAGNOSIS — Z86711 Personal history of pulmonary embolism: Secondary | ICD-10-CM | POA: Diagnosis not present

## 2019-01-04 DIAGNOSIS — Z87891 Personal history of nicotine dependence: Secondary | ICD-10-CM | POA: Insufficient documentation

## 2019-01-04 NOTE — Progress Notes (Signed)
Patient ID: Brandon Patricia., male   DOB: 07/16/1950, 68 y.o.   MRN: [id number]    Primary Care Physician: Danna Hefty, DO Referring Physician: Dr. Raynaldo Opitz Dombeck [name] is a 68 y.o. male with a h/o afib, s/p ablation, maintaining SR, off amiodarone. He had one episode of afib in July, none since then He has gained 20 lbs since Covid inactivity. He had rt knee replacement in May.  He recently had 2 separet ER visits for SVT with RVR and very hypotensive. The first time in ER, he was given adenosine and broke but with return of SVT, he was cardioverted. He went home only to have  the same scenario to repeat again the next day. This time he was kept and treated. These episodes  may have been set off by pt not having his coreg 25 mg bid for a week as he was waiting for refills. He was given amiodarone IV but when he returned to SR, this was stopped and he was gotten back on his BB. Pt states that Dr. Lovena Le wanted to see him after discharge  to  discuss SVT ablation. CHA2DS2VASc of 2, on eliquis.  Today, he denies symptoms of palpitations, chest pain, shortness of breath, orthopnea, PND, lower extremity edema, dizziness, presyncope, syncope, or neurologic sequela. The patient is tolerating medications without difficulties and is otherwise without complaint today.   Past Medical History:  Diagnosis Date  . Anxiety   . Arthritis   . Benign neoplasm of colon   . Benign prostatic hyperplasia with urinary obstruction   . Bilateral pulmonary embolism ([place]) 3/14   admitted to [facility],  treated with Xarelto  . Cataract   . Chronic pain   . History of kidney stones   . History of pulmonary embolism 03/19/2012  . Hyperlipemia   . Hypertension   . Insomnia   . Major depressive disorder, recurrent episode ([place])   . Mitral regurgitation 04/24/2013   Mild by TEE  . Obesity   . OSA (obstructive sleep apnea)    noncompliant with CPAP.  07/27/13- awaiting a CPAP- unable to tolerate mask  .  Osteoporosis   . Persistent atrial fibrillation ([place])   . Restless leg syndrome    takes gabapentin  . Sciatica   . SVT (supraventricular tachycardia) ([place]) 03/07/2016  . Thrombus of left atrial appendage 03/09/2013  . Ventral hernia, unspecified, without mention of obstruction or gangrene    right abdominal wall   Past Surgical History:  Procedure Laterality Date  . CARDIOVERSION N/A 03/21/2012   Procedure: CARDIOVERSION;  Surgeon: Birdie Riddle, MD;  Location: [facility] ENDOSCOPY;  Service: Cardiovascular;  Laterality: N/A;  . CARDIOVERSION N/A 04/24/2013   Procedure: CARDIOVERSION;  Surgeon: Dorothy Spark, MD;  Location: [facility] ENDOSCOPY;  Service: Cardiovascular;  Laterality: N/A;  . CARDIOVERSION N/A 06/27/2015   Procedure: CARDIOVERSION;  Surgeon: Larey Dresser, MD;  Location: [place];  Service: Cardiovascular;  Laterality: N/A;  . CATARACT EXTRACTION    . COLONOSCOPY W/ POLYPECTOMY    . ELECTROPHYSIOLOGIC STUDY N/A 07/30/2015   Procedure: Atrial Fibrillation Ablation;  Surgeon: Thompson Grayer, MD;  Location: [place] CV LAB;  Service: Cardiovascular;  Laterality: N/A;  . EXTRACORPOREAL SHOCK WAVE LITHOTRIPSY Right 10/29/2016   Procedure: RIGHT EXTRACORPOREAL SHOCK WAVE LITHOTRIPSY (ESWL);  Surgeon: Alexis Frock, MD;  Location: [facility] ORS;  Service: Urology;  Laterality: Right;  . EYE SURGERY Right    cataract  . FEMUR FRACTURE SURGERY    .  HERNIA REPAIR  07/06/2011  . INGUINAL HERNIA REPAIR Right 07/28/2013   Procedure: RIGHT INGUINAL HERNIA REPAIR;  Surgeon: Imogene Burn. Georgette Dover, MD;  Location: [place];  Service: General;  Laterality: Right;  . INGUINAL HERNIA REPAIR Right 09/22/2016   Procedure: LAPAROSCOPIC RIGHT INGUINAL HERNIA;  Surgeon: Kinsinger, Arta Bruce, MD;  Location: [facility];  Service: General;  Laterality: Right;  With MESH  . INSERTION OF MESH Right 07/28/2013   Procedure: INSERTION OF MESH;  Surgeon: Imogene Burn. Georgette Dover, MD;  Location: [place];  Service: General;  Laterality: Right;   . KNEE ARTHROSCOPY     left  . REPLACEMENT TOTAL KNEE Left   . ROTATOR CUFF REPAIR     right  . ROTATOR CUFF REPAIR Left 03/08/2014   DR SUPPLE  . SHOULDER ARTHROSCOPY WITH ROTATOR CUFF REPAIR AND SUBACROMIAL DECOMPRESSION Left 03/08/2014   Procedure: LEFT SHOULDER ARTHROSCOPY WITH ROTATOR CUFF REPAIR/SUBACROMIAL DECOMPRESSION/DISTAL CLAVICLE RESECTION;  Surgeon: Marin Shutter, MD;  Location: [place];  Service: Orthopedics;  Laterality: Left;  . TEE WITHOUT CARDIOVERSION N/A 03/21/2012   Procedure: TRANSESOPHAGEAL ECHOCARDIOGRAM (TEE);  Surgeon: Birdie Riddle, MD;  Location: [place];  Service: Cardiovascular;  Laterality: N/A;  . TEE WITHOUT CARDIOVERSION N/A 04/24/2013   Procedure: TRANSESOPHAGEAL ECHOCARDIOGRAM (TEE);  Surgeon: Dorothy Spark, MD;  Location: [place];  Service: Cardiovascular;  Laterality: N/A;  . TEE WITHOUT CARDIOVERSION N/A 07/29/2015   Procedure: TRANSESOPHAGEAL ECHOCARDIOGRAM (TEE);  Surgeon: Fay Records, MD;  Location: [facility] ENDOSCOPY;  Service: Cardiovascular;  Laterality: N/A;  . TONSILLECTOMY    . TOTAL KNEE ARTHROPLASTY Right 05/24/2018   Procedure: RIGHT TOTAL KNEE ARTHROPLASTY;  Surgeon: Paralee Cancel, MD;  Location: [facility];  Service: Orthopedics;  Laterality: Right;  70 mins      Allergies  Allergen Reactions  . Ace Inhibitors Cough  . Albuterol Other (See Comments)    Racing heart    Social History   Socioeconomic History  . Marital status: Married    Spouse name: Not on file  . Number of children: Not on file  . Years of education: Not on file  . Highest education level: Not on file  Occupational History  . Not on file  Tobacco Use  . Smoking status: Former Smoker    Packs/day: 0.50    Years: 5.00    Pack years: 2.50    Types: Cigarettes    Quit date: 1976    Years since quitting: 45.0  . Smokeless tobacco: Never Used  Substance and Sexual Activity  . Alcohol use: No    Comment: Former EtOH abuse, stopped 06/2016  . Drug use:  No    Comment: negative hx for IV drug abuse  . Sexual activity: Not on file  Other Topics Concern  . Not on file  Social History Narrative   Lives with wife in [place]   Retired Dietitian   Wife lives in town (still married but lives separate)   Has biological daughter in [place] ([place])   Social Determinants of Health   Financial Resource Strain:   . Difficulty of Paying Living Expenses: Not on file  Food Insecurity:   . Worried About Charity fundraiser in the Last Year: Not on file  . Ran Out of Food in the Last Year: Not on file  Transportation Needs:   . Lack of Transportation (Medical): Not on file  . Lack of Transportation (Non-Medical): Not on file  Physical Activity:   .  Days of Exercise per Week: Not on file  . Minutes of Exercise per Session: Not on file  Stress:   . Feeling of Stress : Not on file  Social Connections:   . Frequency of Communication with Friends and Family: Not on file  . Frequency of Social Gatherings with Friends and Family: Not on file  . Attends Religious Services: Not on file  . Active Member of Clubs or Organizations: Not on file  . Attends Archivist Meetings: Not on file  . Marital Status: Not on file  Intimate Partner Violence:   . Fear of Current or Ex-Partner: Not on file  . Emotionally Abused: Not on file  . Physically Abused: Not on file  . Sexually Abused: Not on file    Family History  Problem Relation Age of Onset  . Cancer Father        bone  . Heart failure Mother   . Hypertension Mother   . Asthma Mother   . Cancer Paternal Grandmother        ovarian  . CVA Other        Fam Hx of multiple myeloma  . Diabetes Other        Fam Hx of DM    ROS- All systems are reviewed and negative except as per the HPI above  Physical Exam: Vitals:   01/04/19 1031  BP: 122/78  Pulse: 69  Weight: (!) 137.1 kg  Height: _0  (1.93 m)    GEN- The patient is well appearing, alert and oriented x 3  today.   Head- normocephalic, atraumatic Eyes-  Sclera clear, conjunctiva pink Ears- hearing intact Oropharynx- clear Neck- supple, no JVP Lymph- no cervical lymphadenopathy Lungs- Clear to ausculation bilaterally, normal work of breathing Heart- Regular rate and rhythm, no murmurs, rubs or gallops, PMI not laterally displaced GI- soft, NT, ND, + BS Extremities- no clubbing, cyanosis, or edema MS- no significant deformity or atrophy Skin- no rash or lesion Psych- euthymic mood, full affect Neuro- strength and sensation are intact  EKG- SR at 69 bpm, PR int 292m, Qrs int 112 ms, QTc 426 ms   Assessment and Plan: 1. [facility]ib [facility] may have been being of coreg x one week waiting for refills  No further episodes  Dr.  TLovena Lediscussed ablation with pt and will get appointment with pt to f/u to further discuss  2. HTN Stable   3. CHA2DS2VASc score of 2 Continue  eliquis 5 mg bid  F/u with Dr. TLovena Leas requested   DGeroge Baseman Sarthak Rubenstein, APer[facility] LaneGCumbola [place] [id number]

## 2019-01-09 NOTE — Telephone Encounter (Signed)
Outreach made to Pt.  Pt does not have anyone that would be able to drive him home from this procedure.  Will need to do some additional research.

## 2019-01-13 NOTE — Telephone Encounter (Signed)
At this time Pt's need a ride home AND someone to stay overnight with them after the procedure or they cannot have the procedure.  Left detailed message advising Pt.

## 2019-01-17 ENCOUNTER — Telehealth: Payer: Self-pay

## 2019-01-17 NOTE — Telephone Encounter (Signed)
Patient is returning phone call to schedule procedure.

## 2019-01-17 NOTE — Telephone Encounter (Signed)
Pt scheduled for SVT ablation on January 23, 2019 .  Pt has hired nurse to drive him home and stay overnight with him night of procedure.  Arlys John @ [phone]   Work up complete

## 2019-01-20 ENCOUNTER — Other Ambulatory Visit (HOSPITAL_COMMUNITY)
Admission: RE | Admit: 2019-01-20 | Discharge: 2019-01-20 | Disposition: A | Discharge status: Home / Self Care | Payer: Medicare Other | Source: Ambulatory Visit | Attending: Internal Medicine | Admitting: Internal Medicine

## 2019-01-20 DIAGNOSIS — R002 Palpitations: Secondary | ICD-10-CM | POA: Diagnosis not present

## 2019-01-20 DIAGNOSIS — R079 Chest pain, unspecified: Secondary | ICD-10-CM | POA: Diagnosis not present

## 2019-01-20 DIAGNOSIS — R072 Precordial pain: Secondary | ICD-10-CM | POA: Diagnosis not present

## 2019-01-20 DIAGNOSIS — Z20822 Contact with and (suspected) exposure to covid-19: Secondary | ICD-10-CM | POA: Diagnosis not present

## 2019-01-20 DIAGNOSIS — R579 Shock, unspecified: Secondary | ICD-10-CM | POA: Diagnosis not present

## 2019-01-20 DIAGNOSIS — E785 Hyperlipidemia, unspecified: Secondary | ICD-10-CM | POA: Diagnosis not present

## 2019-01-20 DIAGNOSIS — Z01812 Encounter for preprocedural laboratory examination: Secondary | ICD-10-CM | POA: Insufficient documentation

## 2019-01-20 DIAGNOSIS — N138 Other obstructive and reflux uropathy: Secondary | ICD-10-CM | POA: Diagnosis not present

## 2019-01-20 DIAGNOSIS — I10 Essential (primary) hypertension: Secondary | ICD-10-CM | POA: Diagnosis not present

## 2019-01-20 DIAGNOSIS — I471 Supraventricular tachycardia: Secondary | ICD-10-CM | POA: Diagnosis not present

## 2019-01-20 LAB — SARS CORONAVIRUS 2 (TAT 6-24 HRS): SARS Coronavirus 2: NEGATIVE

## 2019-01-21 ENCOUNTER — Other Ambulatory Visit: Payer: Self-pay

## 2019-01-21 ENCOUNTER — Emergency Department (HOSPITAL_COMMUNITY): Payer: Medicare Other

## 2019-01-21 ENCOUNTER — Inpatient Hospital Stay (HOSPITAL_COMMUNITY)
Admission: EM | Admit: 2019-01-21 | Discharge: 2019-01-23 | DRG: 274 | Disposition: A | Discharge status: Home / Self Care | Payer: Medicare Other | Attending: Cardiovascular Disease | Admitting: Cardiovascular Disease

## 2019-01-21 ENCOUNTER — Encounter (HOSPITAL_COMMUNITY): Payer: Self-pay | Admitting: Emergency Medicine

## 2019-01-21 DIAGNOSIS — Z825 Family history of asthma and other chronic lower respiratory diseases: Secondary | ICD-10-CM

## 2019-01-21 DIAGNOSIS — E785 Hyperlipidemia, unspecified: Secondary | ICD-10-CM | POA: Diagnosis present

## 2019-01-21 DIAGNOSIS — R579 Shock, unspecified: Secondary | ICD-10-CM | POA: Diagnosis present

## 2019-01-21 DIAGNOSIS — G2581 Restless legs syndrome: Secondary | ICD-10-CM | POA: Diagnosis present

## 2019-01-21 DIAGNOSIS — Z7901 Long term (current) use of anticoagulants: Secondary | ICD-10-CM

## 2019-01-21 DIAGNOSIS — Z86711 Personal history of pulmonary embolism: Secondary | ICD-10-CM

## 2019-01-21 DIAGNOSIS — G4733 Obstructive sleep apnea (adult) (pediatric): Secondary | ICD-10-CM | POA: Diagnosis present

## 2019-01-21 DIAGNOSIS — N401 Enlarged prostate with lower urinary tract symptoms: Secondary | ICD-10-CM | POA: Diagnosis present

## 2019-01-21 DIAGNOSIS — Z9119 Patient's noncompliance with other medical treatment and regimen: Secondary | ICD-10-CM | POA: Diagnosis not present

## 2019-01-21 DIAGNOSIS — Z79899 Other long term (current) drug therapy: Secondary | ICD-10-CM

## 2019-01-21 DIAGNOSIS — I480 Paroxysmal atrial fibrillation: Secondary | ICD-10-CM | POA: Diagnosis not present

## 2019-01-21 DIAGNOSIS — M810 Age-related osteoporosis without current pathological fracture: Secondary | ICD-10-CM | POA: Diagnosis present

## 2019-01-21 DIAGNOSIS — Z87442 Personal history of urinary calculi: Secondary | ICD-10-CM

## 2019-01-21 DIAGNOSIS — N138 Other obstructive and reflux uropathy: Secondary | ICD-10-CM | POA: Diagnosis present

## 2019-01-21 DIAGNOSIS — I471 Supraventricular tachycardia: Principal | ICD-10-CM | POA: Diagnosis present

## 2019-01-21 DIAGNOSIS — R0902 Hypoxemia: Secondary | ICD-10-CM | POA: Diagnosis not present

## 2019-01-21 DIAGNOSIS — Z833 Family history of diabetes mellitus: Secondary | ICD-10-CM

## 2019-01-21 DIAGNOSIS — I4819 Other persistent atrial fibrillation: Secondary | ICD-10-CM | POA: Diagnosis present

## 2019-01-21 DIAGNOSIS — R079 Chest pain, unspecified: Secondary | ICD-10-CM | POA: Diagnosis not present

## 2019-01-21 DIAGNOSIS — Z20822 Contact with and (suspected) exposure to covid-19: Secondary | ICD-10-CM | POA: Diagnosis present

## 2019-01-21 DIAGNOSIS — Z86010 Personal history of colonic polyps: Secondary | ICD-10-CM | POA: Diagnosis not present

## 2019-01-21 DIAGNOSIS — Z807 Family history of other malignant neoplasms of lymphoid, hematopoietic and related tissues: Secondary | ICD-10-CM | POA: Diagnosis not present

## 2019-01-21 DIAGNOSIS — I10 Essential (primary) hypertension: Secondary | ICD-10-CM | POA: Diagnosis not present

## 2019-01-21 DIAGNOSIS — Z823 Family history of stroke: Secondary | ICD-10-CM

## 2019-01-21 DIAGNOSIS — H269 Unspecified cataract: Secondary | ICD-10-CM | POA: Diagnosis present

## 2019-01-21 DIAGNOSIS — Z96653 Presence of artificial knee joint, bilateral: Secondary | ICD-10-CM | POA: Diagnosis present

## 2019-01-21 DIAGNOSIS — R072 Precordial pain: Secondary | ICD-10-CM

## 2019-01-21 DIAGNOSIS — Z8249 Family history of ischemic heart disease and other diseases of the circulatory system: Secondary | ICD-10-CM

## 2019-01-21 DIAGNOSIS — R002 Palpitations: Secondary | ICD-10-CM | POA: Diagnosis not present

## 2019-01-21 DIAGNOSIS — R0789 Other chest pain: Secondary | ICD-10-CM | POA: Diagnosis not present

## 2019-01-21 LAB — CBC WITH DIFFERENTIAL/PLATELET
Abs Immature Granulocytes: 0.03 10*3/uL (ref 0.00–0.07)
Basophils Absolute: 0 10*3/uL (ref 0.0–0.1)
Basophils Relative: 0 %
Eosinophils Absolute: 0.5 10*3/uL (ref 0.0–0.5)
Eosinophils Relative: 6 %
HCT: 42.2 % (ref 39.0–52.0)
Hemoglobin: 13.8 g/dL (ref 13.0–17.0)
Immature Granulocytes: 0 %
Lymphocytes Relative: 21 %
Lymphs Abs: 1.6 10*3/uL (ref 0.7–4.0)
MCH: 30.3 pg (ref 26.0–34.0)
MCHC: 32.7 g/dL (ref 30.0–36.0)
MCV: 92.7 fL (ref 80.0–100.0)
Monocytes Absolute: 0.8 10*3/uL (ref 0.1–1.0)
Monocytes Relative: 11 %
Neutro Abs: 4.5 10*3/uL (ref 1.7–7.7)
Neutrophils Relative %: 62 %
Platelets: 145 10*3/uL — ABNORMAL LOW (ref 150–400)
RBC: 4.55 MIL/uL (ref 4.22–5.81)
RDW: 12.8 % (ref 11.5–15.5)
WBC: 7.4 10*3/uL (ref 4.0–10.5)
nRBC: 0 % (ref 0.0–0.2)

## 2019-01-21 LAB — TROPONIN I (HIGH SENSITIVITY)
Troponin I (High Sensitivity): 25 ng/L — ABNORMAL HIGH (ref <18)
Troponin I (High Sensitivity): 52 ng/L — ABNORMAL HIGH (ref <18)

## 2019-01-21 LAB — COMPREHENSIVE METABOLIC PANEL
ALT: 15 U/L (ref 0–44)
AST: 16 U/L (ref 15–41)
Albumin: 3.5 g/dL (ref 3.5–5.0)
Alkaline Phosphatase: 74 U/L (ref 38–126)
Anion gap: 7 (ref 5–15)
BUN: 10 mg/dL (ref 8–23)
CO2: 26 mmol/L (ref 22–32)
Calcium: 8.3 mg/dL — ABNORMAL LOW (ref 8.9–10.3)
Chloride: 107 mmol/L (ref 98–111)
Creatinine, Ser: 0.81 mg/dL (ref 0.61–1.24)
GFR calc Af Amer: >60 mL/min (ref >60)
GFR calc non Af Amer: >60 mL/min (ref >60)
Glucose, Bld: 94 mg/dL (ref 70–99)
Potassium: 3.6 mmol/L (ref 3.5–5.1)
Sodium: 140 mmol/L (ref 135–145)
Total Bilirubin: 0.7 mg/dL (ref 0.3–1.2)
Total Protein: 6 g/dL — ABNORMAL LOW (ref 6.5–8.1)

## 2019-01-21 LAB — SURGICAL PCR SCREEN
MRSA, PCR: NEGATIVE
Staphylococcus aureus: NEGATIVE

## 2019-01-21 LAB — MRSA PCR SCREENING: MRSA by PCR: NEGATIVE

## 2019-01-21 LAB — TSH: TSH: 1.64 u[IU]/mL (ref 0.350–4.500)

## 2019-01-21 LAB — BRAIN NATRIURETIC PEPTIDE: B Natriuretic Peptide: 115.2 pg/mL — ABNORMAL HIGH (ref 0.0–100.0)

## 2019-01-21 LAB — MAGNESIUM: Magnesium: 2.2 mg/dL (ref 1.7–2.4)

## 2019-01-21 MED ORDER — ESCITALOPRAM OXALATE 10 MG PO TABS
20.0000 mg | ORAL_TABLET | Freq: Every day | ORAL | Status: DC
  Administered 2019-01-22 – 2019-01-23 (×2): 20 mg via ORAL
  Filled 2019-01-21 (×2): qty 2

## 2019-01-21 MED ORDER — ATORVASTATIN CALCIUM 40 MG PO TABS
40.0000 mg | ORAL_TABLET | Freq: Every day | ORAL | Status: DC
  Administered 2019-01-22 – 2019-01-23 (×2): 40 mg via ORAL
  Filled 2019-01-21 (×2): qty 1

## 2019-01-21 MED ORDER — SODIUM CHLORIDE 0.9 % IV BOLUS
500.0000 mL | Freq: Once | INTRAVENOUS | Status: AC
  Administered 2019-01-21: 500 mL via INTRAVENOUS

## 2019-01-21 MED ORDER — NON FORMULARY: 5.0000 mg | Freq: Once | Status: DC

## 2019-01-21 MED ORDER — TAMSULOSIN HCL 0.4 MG PO CAPS
0.4000 mg | ORAL_CAPSULE | Freq: Two times a day (BID) | ORAL | Status: DC
  Administered 2019-01-21 – 2019-01-23 (×4): 0.4 mg via ORAL
  Filled 2019-01-21 (×4): qty 1

## 2019-01-21 MED ORDER — APIXABAN 5 MG PO TABS
5.0000 mg | ORAL_TABLET | Freq: Two times a day (BID) | ORAL | Status: DC
  Administered 2019-01-21 – 2019-01-22 (×2): 5 mg via ORAL
  Filled 2019-01-21 (×2): qty 1

## 2019-01-21 MED ORDER — ACETAMINOPHEN 325 MG PO TABS
650.0000 mg | ORAL_TABLET | Freq: Four times a day (QID) | ORAL | Status: DC | PRN
  Administered 2019-01-21 – 2019-01-22 (×2): 650 mg via ORAL
  Filled 2019-01-21 (×2): qty 2

## 2019-01-21 MED ORDER — GABAPENTIN 400 MG PO CAPS
800.0000 mg | ORAL_CAPSULE | Freq: Two times a day (BID) | ORAL | Status: DC
  Administered 2019-01-21 – 2019-01-23 (×4): 800 mg via ORAL
  Filled 2019-01-21 (×5): qty 2

## 2019-01-21 MED ORDER — CARVEDILOL 6.25 MG PO TABS: 6.2500 mg | ORAL_TABLET | Freq: Two times a day (BID) | ORAL | Status: DC

## 2019-01-21 MED ORDER — CARVEDILOL 25 MG PO TABS: 25.0000 mg | ORAL_TABLET | Freq: Two times a day (BID) | ORAL | Status: DC

## 2019-01-21 MED ORDER — MELATONIN 3 MG PO TABS
6.0000 mg | ORAL_TABLET | Freq: Every day | ORAL | Status: DC
  Administered 2019-01-21 – 2019-01-22 (×2): 6 mg via ORAL
  Filled 2019-01-21 (×3): qty 2

## 2019-01-21 NOTE — Progress Notes (Signed)
Paged dr Marletta Lor, patient requesting something for sleep usually takes tylenol PM at home. New orders rec'd for something for sleep.

## 2019-01-21 NOTE — Plan of Care (Signed)
  Problem: Education: Goal: Knowledge of General Education information will improve Description: Including pain rating scale, medication(s)/side effects and non-pharmacologic comfort measures Outcome: Progressing   Problem: Health Behavior/Discharge Planning: Goal: Ability to manage health-related needs will improve Outcome: Progressing   Problem: Clinical Measurements: Goal: Ability to maintain clinical measurements within normal limits will improve Outcome: Progressing Goal: Will remain free from infection Outcome: Progressing Goal: Diagnostic test results will improve Outcome: Progressing Goal: Respiratory complications will improve Outcome: Progressing Goal: Cardiovascular complication will be avoided Outcome: Progressing   Problem: Activity: Goal: Risk for activity intolerance will decrease Outcome: Progressing   Problem: Nutrition: Goal: Adequate nutrition will be maintained Outcome: Progressing   Problem: Coping: Goal: Level of anxiety will decrease Outcome: Progressing   Problem: Elimination: Goal: Will not experience complications related to bowel motility Outcome: Progressing Goal: Will not experience complications related to urinary retention Outcome: Progressing   Problem: Pain Managment: Goal: General experience of comfort will improve Outcome: Progressing   Problem: Safety: Goal: Ability to remain free from injury will improve Outcome: Progressing   Problem: Skin Integrity: Goal: Risk for impaired skin integrity will decrease Outcome: Progressing   Problem: Education: Goal: Understanding of disease, treatment, and recovery process will improve Outcome: Progressing   Problem: Activity: Goal: Ability to return to baseline activity level will improve Outcome: Progressing   Problem: Cardiac: Goal: Ability to maintain adequate cardiovascular perfusion will improve Outcome: Progressing Goal: Vascular access site(s) Level 0-1 will be  maintained Outcome: Progressing   Problem: Health Behavior/ Discharge Planning: Goal: Ability to safely manage health related needs after discharge Outcome: Progressing

## 2019-01-21 NOTE — ED Provider Notes (Signed)
[facility] EMERGENCY DEPARTMENT Provider Note   CSN: [id number] Arrival date & time: 01/21/19  1256     History No chief complaint on file. palpitations  Brandon [name]. is a 69 y.o. male.  The history is provided by the patient and medical records. No language interpreter was used.  Palpitations Palpitations quality:  Fast Onset quality:  Sudden Duration:  1 day Timing:  Constant Progression:  Resolved Chronicity:  Recurrent Relieved by:  Nothing Worsened by:  Nothing Ineffective treatments:  Valsalva Associated symptoms: chest pain, chest pressure, malaise/fatigue and near-syncope   Associated symptoms: no back pain, no cough, no diaphoresis, no leg pain, no lower extremity edema, no nausea, no shortness of breath, no vomiting and no weakness   Risk factors: hx of atrial fibrillation        Past Medical History:  Diagnosis Date  . Anxiety   . Arthritis   . Benign neoplasm of colon   . Benign prostatic hyperplasia with urinary obstruction   . Bilateral pulmonary embolism ([place]) 3/14   admitted to [facility],  treated with Xarelto  . Cataract   . Chronic pain   . History of kidney stones   . History of pulmonary embolism 03/19/2012  . Hyperlipemia   . Hypertension   . Insomnia   . Major depressive disorder, recurrent episode ([place])   . Mitral regurgitation 04/24/2013   Mild by TEE  . Obesity   . OSA (obstructive sleep apnea)    noncompliant with CPAP.  07/27/13- awaiting a CPAP- unable to tolerate mask  . Osteoporosis   . Persistent atrial fibrillation ([place])   . Restless leg syndrome    takes gabapentin  . Sciatica   . SVT (supraventricular tachycardia) ([place]) 03/07/2016  . Thrombus of left atrial appendage 03/09/2013  . Ventral hernia, unspecified, without mention of obstruction or gangrene    right abdominal wall    Patient Active Problem List   Diagnosis Date Noted  . S/P right total knee arthroplasty 05/24/2018  . Insomnia due to  medical condition 12/23/2016  . BPH (benign prostatic hyperplasia) 10/21/2016  . Essential hypertension 10/21/2016  . MDD (major depressive disorder) 10/21/2016  . Obesity (BMI 30-39.9) 03/07/2016  . History of alcohol abuse 03/07/2016  . History of colon polyps 11/18/2015  . Paroxysmal atrial fibrillation (HCC)   . Restless legs syndrome 02/08/2014  . Generalized anxiety disorder 12/12/2013  . Recurrent unilateral inguinal hernia 07/17/2013  . Nonrheumatic mitral valve insufficiency 04/25/2013  . Current use of long term anticoagulation   . Obstructive sleep apnea 03/09/2013  . Chronic bilateral low back pain with sciatica 03/21/2012  . HLD (hyperlipidemia) 03/21/2012  . Arthralgia of hip 03/21/2012  . History of pulmonary embolism 03/19/2012  . Bilateral inguinal hernia 05/29/2011  . Uncomplicated asthma 70/01/7492  . Anxiety state 07/18/2007    Past Surgical History:  Procedure Laterality Date  . CARDIOVERSION N/A 03/21/2012   Procedure: CARDIOVERSION;  Surgeon: Birdie Riddle, MD;  Location: [facility] ENDOSCOPY;  Service: Cardiovascular;  Laterality: N/A;  . CARDIOVERSION N/A 04/24/2013   Procedure: CARDIOVERSION;  Surgeon: Dorothy Spark, MD;  Location: [facility] ENDOSCOPY;  Service: Cardiovascular;  Laterality: N/A;  . CARDIOVERSION N/A 06/27/2015   Procedure: CARDIOVERSION;  Surgeon: Larey Dresser, MD;  Location: [facility];  Service: Cardiovascular;  Laterality: N/A;  . CATARACT EXTRACTION    . COLONOSCOPY W/ POLYPECTOMY    . ELECTROPHYSIOLOGIC STUDY N/A 07/30/2015   Procedure: Atrial Fibrillation Ablation;  Surgeon: Jeneen Rinks  Allred, MD;  Location: [facility] CV LAB;  Service: Cardiovascular;  Laterality: N/A;  . EXTRACORPOREAL SHOCK WAVE LITHOTRIPSY Right 10/29/2016   Procedure: RIGHT EXTRACORPOREAL SHOCK WAVE LITHOTRIPSY (ESWL);  Surgeon: Alexis Frock, MD;  Location: [facility] ORS;  Service: Urology;  Laterality: Right;  . EYE SURGERY Right    cataract  . FEMUR FRACTURE SURGERY    .  HERNIA REPAIR  07/06/2011  . INGUINAL HERNIA REPAIR Right 07/28/2013   Procedure: RIGHT INGUINAL HERNIA REPAIR;  Surgeon: Imogene Burn. Georgette Dover, MD;  Location: [facility];  Service: General;  Laterality: Right;  . INGUINAL HERNIA REPAIR Right 09/22/2016   Procedure: LAPAROSCOPIC RIGHT INGUINAL HERNIA;  Surgeon: Kinsinger, Arta Bruce, MD;  Location: [facility] ORS;  Service: General;  Laterality: Right;  With MESH  . INSERTION OF MESH Right 07/28/2013   Procedure: INSERTION OF MESH;  Surgeon: Imogene Burn. Georgette Dover, MD;  Location: [facility];  Service: General;  Laterality: Right;  . KNEE ARTHROSCOPY     left  . REPLACEMENT TOTAL KNEE Left   . ROTATOR CUFF REPAIR     right  . ROTATOR CUFF REPAIR Left 03/08/2014   DR SUPPLE  . SHOULDER ARTHROSCOPY WITH ROTATOR CUFF REPAIR AND SUBACROMIAL DECOMPRESSION Left 03/08/2014   Procedure: LEFT SHOULDER ARTHROSCOPY WITH ROTATOR CUFF REPAIR/SUBACROMIAL DECOMPRESSION/DISTAL CLAVICLE RESECTION;  Surgeon: Marin Shutter, MD;  Location: [facility];  Service: Orthopedics;  Laterality: Left;  . TEE WITHOUT CARDIOVERSION N/A 03/21/2012   Procedure: TRANSESOPHAGEAL ECHOCARDIOGRAM (TEE);  Surgeon: Birdie Riddle, MD;  Location: [facility];  Service: Cardiovascular;  Laterality: N/A;  . TEE WITHOUT CARDIOVERSION N/A 04/24/2013   Procedure: TRANSESOPHAGEAL ECHOCARDIOGRAM (TEE);  Surgeon: Dorothy Spark, MD;  Location: [facility];  Service: Cardiovascular;  Laterality: N/A;  . TEE WITHOUT CARDIOVERSION N/A 07/29/2015   Procedure: TRANSESOPHAGEAL ECHOCARDIOGRAM (TEE);  Surgeon: Fay Records, MD;  Location: [facility] ENDOSCOPY;  Service: Cardiovascular;  Laterality: N/A;  . TONSILLECTOMY    . TOTAL KNEE ARTHROPLASTY Right 05/24/2018   Procedure: RIGHT TOTAL KNEE ARTHROPLASTY;  Surgeon: Paralee Cancel, MD;  Location: [facility] ORS;  Service: Orthopedics;  Laterality: Right;  70 mins       Family History  Problem Relation Age of Onset  . Cancer Father        bone  . Heart failure Mother   . Hypertension Mother    . Asthma Mother   . Cancer Paternal Grandmother        ovarian  . CVA Other        Fam Hx of multiple myeloma  . Diabetes Other        Fam Hx of DM    Social History   Tobacco Use  . Smoking status: Former Smoker    Packs/day: 0.50    Years: 5.00    Pack years: 2.50    Types: Cigarettes    Quit date: 1976    Years since quitting: 45.0  . Smokeless tobacco: Never Used  Substance Use Topics  . Alcohol use: No    Comment: Former EtOH abuse, stopped 06/2016  . Drug use: No    Comment: negative hx for IV drug abuse    Home Medications Prior to Admission medications   Medication Sig Start Date End Date Taking? Authorizing Provider  amLODipine (NORVASC) 5 MG tablet TAKE 1 TABLET BY MOUTH EVERY DAY Patient taking differently: Take 5 mg by mouth every evening.  01/12/18   [place] Bing, DO  apixaban (ELIQUIS) 5 MG TABS tablet Take 1 tablet (  5 mg total) by mouth 2 (two) times daily. 12/21/18   Allred, Jeneen Rinks, MD  atorvastatin (LIPITOR) 40 MG tablet Take 1 tablet (40 mg total) by mouth daily. 10/05/18   Mullis, Kiersten P, DO  carvedilol (COREG) 25 MG tablet TAKE 1 TABLET (25 MG TOTAL) BY MOUTH 2 (TWO) TIMES DAILY WITH A MEAL. 11/25/17   [place] Bing, DO  diphenhydramine-acetaminophen (TYLENOL PM) 25-500 MG TABS tablet Take 3 tablets by mouth at bedtime as needed (sleep).    [provider]  escitalopram (LEXAPRO) 20 MG tablet TAKE 1 TABLET BY MOUTH EVERY DAY Patient taking differently: Take 20 mg by mouth daily.  03/29/18   [place] Bing, DO  fluticasone (FLONASE) 50 MCG/ACT nasal spray Place 2 sprays into both nostrils daily as needed for allergies or rhinitis.    [provider]  gabapentin (NEURONTIN) 800 MG tablet Take 1 tablet (800 mg total) by mouth See admin instructions. Takes 1 tablet in the evening and 1 tablet at bedtime. Patient taking differently: Take 800 mg by mouth 2 (two) times daily.  09/30/18   Mullis, Kiersten P, DO  losartan (COZAAR) 100 MG  tablet TAKE 1 TABLET BY MOUTH EVERY DAY Patient taking differently: Take 100 mg by mouth daily.  12/14/17   Allred, Jeneen Rinks, MD  psyllium (METAMUCIL) 58.6 % packet Take 1 packet by mouth every other day.    [provider]  spironolactone (ALDACTONE) 25 MG tablet Take 1 tablet (25 mg total) by mouth daily. Patient taking differently: Take 25 mg by mouth every evening.  12/23/18   Allred, Jeneen Rinks, MD  tamsulosin (FLOMAX) 0.4 MG CAPS capsule Take 1 capsule (0.4 mg total) by mouth 2 (two) times daily. 09/23/18   Mullis, Kiersten P, DO    Allergies    Ace inhibitors and Albuterol  Review of Systems   Review of Systems  Constitutional: Positive for fatigue and malaise/fatigue. Negative for chills, diaphoresis and fever.  HENT: Negative for congestion.   Respiratory: Negative for cough, chest tightness, shortness of breath, wheezing and stridor.   Cardiovascular: Positive for chest pain, palpitations and near-syncope. Negative for leg swelling.  Gastrointestinal: Negative for abdominal pain, diarrhea, nausea and vomiting.  Genitourinary: Negative for flank pain.  Musculoskeletal: Negative for back pain, neck pain and neck stiffness.  Neurological: Positive for light-headedness. Negative for weakness and headaches.  Psychiatric/Behavioral: Negative for agitation.  All other systems reviewed and are negative.   Physical Exam Updated Vital Signs BP 116/77 (BP Location: Right Arm)   Pulse 85   Temp 99.1 F (37.3 C) (Oral)   Resp 17   SpO2 96%   Physical Exam Vitals and nursing note reviewed.  Constitutional:      General: He is not in acute distress.    Appearance: He is not ill-appearing, toxic-appearing or diaphoretic.  HENT:     Head: Normocephalic.     Nose: No congestion or rhinorrhea.     Mouth/Throat:     Mouth: Mucous membranes are moist.     Pharynx: No oropharyngeal exudate or posterior oropharyngeal erythema.  Eyes:     Conjunctiva/sclera: Conjunctivae normal.   Cardiovascular:     Rate and Rhythm: Normal rate and regular rhythm.     Pulses: Normal pulses.     Heart sounds: Murmur present.  Pulmonary:     Effort: Pulmonary effort is normal.     Breath sounds: No wheezing, rhonchi or rales.  Chest:     Chest wall: No tenderness.  Abdominal:  General: Abdomen is flat.     Tenderness: There is no abdominal tenderness.  Musculoskeletal:        General: No tenderness or signs of injury.  Skin:    Capillary Refill: Capillary refill takes less than 2 seconds.     Findings: No erythema.  Neurological:     General: No focal deficit present.     Mental Status: He is alert.  Psychiatric:        Mood and Affect: Mood normal.     ED Results / Procedures / Treatments   Labs (all labs ordered are listed, but only abnormal results are displayed) Labs Reviewed  CBC WITH DIFFERENTIAL/PLATELET - Abnormal; Notable for the following components:      Result Value   Platelets 145 (*)    All other components within normal limits  COMPREHENSIVE METABOLIC PANEL - Abnormal; Notable for the following components:   Calcium 8.3 (*)    Total Protein 6.0 (*)    All other components within normal limits  BRAIN NATRIURETIC PEPTIDE - Abnormal; Notable for the following components:   B Natriuretic Peptide 115.2 (*)    All other components within normal limits  TROPONIN I (HIGH SENSITIVITY) - Abnormal; Notable for the following components:   Troponin I (High Sensitivity) 25 (*)    All other components within normal limits  TROPONIN I (HIGH SENSITIVITY) - Abnormal; Notable for the following components:   Troponin I (High Sensitivity) 52 (*)    All other components within normal limits  MRSA PCR SCREENING  SURGICAL PCR SCREEN  MAGNESIUM  TSH  HIV ANTIBODY (ROUTINE TESTING W REFLEX)    EKG EKG Interpretation  Date/Time:  Saturday January 21 2019 13:46:09 EST Ventricular Rate:  71 PR Interval:    QRS Duration: 100 QT Interval:  374 QTC Calculation:  407 R Axis:   -57 Text Interpretation: Sinus rhythm Prolonged PR interval LAD, consider left anterior fascicular block When compared to prior, still sinus rhythm No STEMI Confirmed by Antony Blackbird [phone]) on 01/21/2019 1:47:55 PM    Radiology DG Chest Portable 1 View  Result Date: 01/21/2019 CLINICAL DATA:  Palpitations, SVT, chest pain EXAM: PORTABLE CHEST 1 VIEW COMPARISON:  12/28/2018 FINDINGS: Stable cardiomegaly without focal pneumonia, collapse or consolidation. Negative for edema, large effusion or pneumothorax. Trachea midline. Aorta atherosclerotic. Degenerative changes of the spine and shoulders. IMPRESSION: Stable cardiomegaly without interval change or superimposed acute process. Electronically Signed   By: Jerilynn Mages.  Shick M.D.   On: 01/21/2019 14:05    Procedures Procedures (including critical care time)  Medications Ordered in ED Medications  atorvastatin (LIPITOR) tablet 40 mg (has no administration in time range)  escitalopram (LEXAPRO) tablet 20 mg (has no administration in time range)  tamsulosin (FLOMAX) capsule 0.4 mg (has no administration in time range)  apixaban (ELIQUIS) tablet 5 mg (has no administration in time range)  gabapentin (NEURONTIN) capsule 800 mg (has no administration in time range)  acetaminophen (TYLENOL) tablet 650 mg (has no administration in time range)  Melatonin TABS 6 mg (has no administration in time range)  sodium chloride 0.9 % bolus 500 mL (0 mLs Intravenous Stopped 01/21/19 1700)    ED Course  I have reviewed the triage vital signs and the nursing notes.  Pertinent labs & imaging results that were available during my care of the patient were reviewed by me and considered in my medical decision making (see chart for details).    MDM Rules/Calculators/A&P  Jr B Delisle [name] is a 69 y.o. male with a past medical history significant for paroxysmal atrial fibrillation on Eliquis therapy, pulmonary embolism, hypertension,  hyperlipidemia, asthma, and recurrent SVT scheduled to have ablation procedure in 2 days who presents with SVT with chest pain and lightheadedness.  According to EMS, patient was found to have tachycardia with rate in the 160s with lightheadedness fatigue, chest pain this morning.  This is similar to prior SVT.  He reports that yesterday he had some palpitations and lightheadedness but did not have the tachycardia yesterday.  This morning it was in the 150s to 160s prompting him to call EMS again.  EMS found him to have SVT for which she received 6 of adenosine followed by 12 then another 12 mg of adenosine.  Patient did not convert.  He then was unable to transfer to the ambulance due to near syncope and lightheadedness and so they did a electrical cardioversion attempt with midazolam.  This was unsuccessful.  He then was successfully dressed to the truck and began transport to emergency department.  During transfer, he self converted to sinus rhythm again.  Patient reports this happened multiple times before prompting his ablation to be scheduled for 2 days from now.  He does report that he was having chest discomfort that felt like pressure and aching in his central chest that has resolved after the self conversion.  He reports no other recent symptoms.  He had a negative Covid test yesterday for his procedure.  He denies fevers, chills, exertion, cough.  Denies any current shortness of breath.  Denies current palpitations.  Denies any urinary symptoms or GI symptoms.  He is concerned that previously he has been seen and had recurrent episodes in clusters and he is concerned about going home again today.   On exam, patient is in sinus rhythm.  Lungs are clear and chest is nontender.  Abdomen is nontender.  Legs have mild edema but are nontender.  Patient resting comfortably on room air.  Slight murmur.  Clinically, will get screening electrolytes and labs.  Will get chest x-ray.  I do not feel his Covid  test is Covid test was negative yesterday.  Will call cardiology to discuss management and see if they feel he is appropriate for admission for telemetry monitoring feels procedure on Monday given his cluster of episodes requiring adenosine and electrocardioversion multiple times in the past.  1:37 PM Just spoke with cardiology who is going to come see the patient.  I anticipate patient will be admitted for further monitoring before his upcoming procedure.  He will still get a screening labs.  As his Covid test was negative preoperatively yesterday, will hold on repeat testing.  Patient is currently asymptomatic and resting.  We will follow up on cardiology recommendations.   Final Clinical Impression(s) / ED Diagnoses Final diagnoses:  Palpitations  Precordial pain  SVT (supraventricular tachycardia) (HCC)    Clinical Impression: 1. Palpitations   2. Precordial pain   3. SVT (supraventricular tachycardia) ([place])     Disposition: Admit  This note was prepared with assistance of Dragon voice recognition software. Occasional wrong-word or sound-a-like substitutions may have occurred due to the inherent limitations of voice recognition software.     Tegeler, Gwenyth Allegra, MD 01/21/19 [phone]

## 2019-01-21 NOTE — ED Triage Notes (Signed)
Pt here from home with c/o svt , history of same , rate of 150 to 160 , pt received 6 /12/12 of adeinsone without relief  ems attempted  Cardioversion  once without success due to b/p in the 70's ,  Pt converted himself in route to hospital  Due for ablation on on Monday

## 2019-01-21 NOTE — Progress Notes (Signed)
CRITICAL VALUE ALERT  Critical Value:  Troponin 25 at 1338 and 52 at 1534  Date & Time Notied:  01/20/2018 at 1640  Provider Notified: Bernerd Pho PA  Orders Received/Actions taken: continue to monitor, no new orders

## 2019-01-21 NOTE — H&P (Addendum)
History & Physical    Patient ID: Brandon [name]. MRN: [id number], DOB/AGE: 03/25/1950   Admit date: 01/21/2019  Primary Care Provider: Danna Hefty, DO Primary Cardiologist: Dr. Rayann Heman  Chief Complaint:  Palpitations and Dizziness  Patient Profile    Brandon [name]. is a 69 y.o. male with past medical history of atrial fibrillation (s/p prior ablation), SVT, HTN, HLD and prior PE who presented to [facility] ED this morning for evaluation of palpitations.   History of Present Illness    Brandon [name]. has been followed by the [facility] due to recurrent SVT with associated hypotension. Was evaluated by Roderic Palau on 01/04/2019 and plans were made for repeat ablation with Dr. Lovena Le, scheduled for this coming Monday (01/23/2019).  He developed recurrent dizziness yesterday evening but reports symptoms were overall mild. This morning, he developed worsening dizziness and upon checking his HR on his Kardia device, HR was in the 150's and he called EMS. Reports associated palpitations ans a fullness along his chest. Denies any associated dyspnea, nausea or vomiting. No recent orthopnea, PND or lower extremity edema.   Upon EMS arrival, he was in SVT and received 6mg  of Adenosine followed by 12mg  and an additional 12mg  without conversion. Cardioversion attempted by EMS without success but converted later in route.   Initial labs show WBC 7.4, Hgb 13.8, and platelets 145. BMET, TSH, Mg and Troponin currently pending. CXR showed stable cardiomegaly without acute findings. EKG shows NSR, HR 71 with 1st degree AV Block. SBP in the low-100's on most recent check.   He denies any recurrent symptoms since converting back to NSR. Palpitations, chest fullness and dizziness now resolved.    Past Medical History:  Diagnosis Date   Anxiety    Arthritis    Benign neoplasm of colon    Benign prostatic hyperplasia with urinary obstruction    Bilateral pulmonary embolism ([place])  3/14   admitted to [facility],  treated with Xarelto   Cataract    Chronic pain    History of kidney stones    History of pulmonary embolism 03/19/2012   Hyperlipemia    Hypertension    Insomnia    Major depressive disorder, recurrent episode ([place])    Mitral regurgitation 04/24/2013   Mild by TEE   Obesity    OSA (obstructive sleep apnea)    noncompliant with CPAP.  07/27/13- awaiting a CPAP- unable to tolerate mask   Osteoporosis    Persistent atrial fibrillation (HCC)    Restless leg syndrome    takes gabapentin   Sciatica    SVT (supraventricular tachycardia) ([place]) 03/07/2016   Thrombus of left atrial appendage 03/09/2013   Ventral hernia, unspecified, without mention of obstruction or gangrene    right abdominal wall    Past Surgical History:  Procedure Laterality Date   CARDIOVERSION N/A 03/21/2012   Procedure: CARDIOVERSION;  Surgeon: Birdie Riddle, MD;  Location: [place] ENDOSCOPY;  Service: Cardiovascular;  Laterality: N/A;   CARDIOVERSION N/A 04/24/2013   Procedure: CARDIOVERSION;  Surgeon: Dorothy Spark, MD;  Location: [facility] ENDOSCOPY;  Service: Cardiovascular;  Laterality: N/A;   CARDIOVERSION N/A 06/27/2015   Procedure: CARDIOVERSION;  Surgeon: Larey Dresser, MD;  Location: [place];  Service: Cardiovascular;  Laterality: N/A;   CATARACT EXTRACTION     COLONOSCOPY W/ POLYPECTOMY     ELECTROPHYSIOLOGIC STUDY N/A 07/30/2015   Procedure: Atrial Fibrillation Ablation;  Surgeon: Thompson Grayer, MD;  Location: [place]  CV LAB;  Service: Cardiovascular;  Laterality: N/A;   EXTRACORPOREAL SHOCK WAVE LITHOTRIPSY Right 10/29/2016   Procedure: RIGHT EXTRACORPOREAL SHOCK WAVE LITHOTRIPSY (ESWL);  Surgeon: Alexis Frock, MD;  Location: [facility];  Service: Urology;  Laterality: Right;   EYE SURGERY Right    cataract   FEMUR FRACTURE SURGERY     HERNIA REPAIR  07/06/2011   INGUINAL HERNIA REPAIR Right 07/28/2013   Procedure: RIGHT INGUINAL HERNIA REPAIR;  Surgeon: Imogene Burn. Georgette Dover,  MD;  Location: [place];  Service: General;  Laterality: Right;   INGUINAL HERNIA REPAIR Right 09/22/2016   Procedure: LAPAROSCOPIC RIGHT INGUINAL HERNIA;  Surgeon: Kieth Brightly Arta Bruce, MD;  Location: [facility];  Service: General;  Laterality: Right;  With MESH   INSERTION OF MESH Right 07/28/2013   Procedure: INSERTION OF MESH;  Surgeon: Imogene Burn. Georgette Dover, MD;  Location: [place];  Service: General;  Laterality: Right;   KNEE ARTHROSCOPY     left   REPLACEMENT TOTAL KNEE Left    ROTATOR CUFF REPAIR     right   ROTATOR CUFF REPAIR Left 03/08/2014   DR SUPPLE   SHOULDER ARTHROSCOPY WITH ROTATOR CUFF REPAIR AND SUBACROMIAL DECOMPRESSION Left 03/08/2014   Procedure: LEFT SHOULDER ARTHROSCOPY WITH ROTATOR CUFF REPAIR/SUBACROMIAL DECOMPRESSION/DISTAL CLAVICLE RESECTION;  Surgeon: Marin Shutter, MD;  Location: [place];  Service: Orthopedics;  Laterality: Left;   TEE WITHOUT CARDIOVERSION N/A 03/21/2012   Procedure: TRANSESOPHAGEAL ECHOCARDIOGRAM (TEE);  Surgeon: Birdie Riddle, MD;  Location: [place];  Service: Cardiovascular;  Laterality: N/A;   TEE WITHOUT CARDIOVERSION N/A 04/24/2013   Procedure: TRANSESOPHAGEAL ECHOCARDIOGRAM (TEE);  Surgeon: Dorothy Spark, MD;  Location: [place];  Service: Cardiovascular;  Laterality: N/A;   TEE WITHOUT CARDIOVERSION N/A 07/29/2015   Procedure: TRANSESOPHAGEAL ECHOCARDIOGRAM (TEE);  Surgeon: Fay Records, MD;  Location: [place];  Service: Cardiovascular;  Laterality: N/A;   TONSILLECTOMY     TOTAL KNEE ARTHROPLASTY Right 05/24/2018   Procedure: RIGHT TOTAL KNEE ARTHROPLASTY;  Surgeon: Paralee Cancel, MD;  Location: [facility];  Service: Orthopedics;  Laterality: Right;  70 mins     Medications Prior to Admission: Prior to Admission medications   Medication Sig Start Date End Date Taking? Authorizing Provider  amLODipine (NORVASC) 5 MG tablet TAKE 1 TABLET BY MOUTH EVERY DAY Patient taking differently: Take 5 mg by mouth every evening.  01/12/18   Morris Bing, DO  apixaban (ELIQUIS) 5 MG TABS tablet Take 1 tablet (5 mg total) by mouth 2 (two) times daily. 12/21/18   Allred, Jeneen Rinks, MD  atorvastatin (LIPITOR) 40 MG tablet Take 1 tablet (40 mg total) by mouth daily. 10/05/18   Mullis, Kiersten P, DO  carvedilol (COREG) 25 MG tablet TAKE 1 TABLET (25 MG TOTAL) BY MOUTH 2 (TWO) TIMES DAILY WITH A MEAL. 11/25/17   [place] Bing, DO  diphenhydramine-acetaminophen (TYLENOL PM) 25-500 MG TABS tablet Take 3 tablets by mouth at bedtime as needed (sleep).    [provider]  escitalopram (LEXAPRO) 20 MG tablet TAKE 1 TABLET BY MOUTH EVERY DAY Patient taking differently: Take 20 mg by mouth daily.  03/29/18   [place] Bing, DO  fluticasone (FLONASE) 50 MCG/ACT nasal spray Place 2 sprays into both nostrils daily as needed for allergies or rhinitis.    [provider]  gabapentin (NEURONTIN) 800 MG tablet Take 1 tablet (800 mg total) by mouth See admin instructions. Takes 1 tablet in the evening and 1 tablet at bedtime. Patient taking differently: Take  800 mg by mouth 2 (two) times daily.  09/30/18   Mullis, Kiersten P, DO  losartan (COZAAR) 100 MG tablet TAKE 1 TABLET BY MOUTH EVERY DAY Patient taking differently: Take 100 mg by mouth daily.  12/14/17   Allred, Jeneen Rinks, MD  psyllium (METAMUCIL) 58.6 % packet Take 1 packet by mouth every other day.    [provider]  spironolactone (ALDACTONE) 25 MG tablet Take 1 tablet (25 mg total) by mouth daily. Patient taking differently: Take 25 mg by mouth every evening.  12/23/18   Allred, Jeneen Rinks, MD  tamsulosin (FLOMAX) 0.4 MG CAPS capsule Take 1 capsule (0.4 mg total) by mouth 2 (two) times daily. 09/23/18   Mullis, Kiersten P, DO     Allergies:    Allergies  Allergen Reactions   Ace Inhibitors Cough   Albuterol Other (See Comments)    Racing heart    Social History:   Social History   Socioeconomic History   Marital status: Married    Spouse name: Not on file   Number of  children: Not on file   Years of education: Not on file   Highest education level: Not on file  Occupational History   Not on file  Tobacco Use   Smoking status: Former Smoker    Packs/day: 0.50    Years: 5.00    Pack years: 2.50    Types: Cigarettes    Quit date: 1976    Years since quitting: 45.0   Smokeless tobacco: Never Used  Substance and Sexual Activity   Alcohol use: No    Comment: Former EtOH abuse, stopped 06/2016   Drug use: No    Comment: negative hx for IV drug abuse   Sexual activity: Not on file  Other Topics Concern   Not on file  Social History Narrative   Lives with wife in [place]   Retired Dietitian   Wife lives in town (still married but lives separate)   Has biological daughter in [place] ([place])   Social Determinants of Health   Financial Resource Strain:    Difficulty of Paying Living Expenses: Not on file  Food Insecurity:    Worried About Charity fundraiser in the Last Year: Not on file   [place] in the Last Year: Not on file  Transportation Needs:    Lack of Transportation (Medical): Not on file   Lack of Transportation (Non-Medical): Not on file  Physical Activity:    Days of Exercise per Week: Not on file   Minutes of Exercise per Session: Not on file  Stress:    Feeling of Stress : Not on file  Social Connections:    Frequency of Communication with Friends and Family: Not on file   Frequency of Social Gatherings with Friends and Family: Not on file   Attends Religious Services: Not on file   Active Member of Clubs or Organizations: Not on file   Attends Archivist Meetings: Not on file   Marital Status: Not on file  Intimate Partner Violence:    Fear of Current or Ex-Partner: Not on file   Emotionally Abused: Not on file   Physically Abused: Not on file   Sexually Abused: Not on file      Family History:   The patient's family history includes Asthma in his mother; CVA in an other family  member; Cancer in his father and paternal grandmother; Diabetes in an other family member;  Heart failure in his mother; Hypertension in his mother.     Review of Systems    General:  No chills, fever, night sweats or weight changes.  Cardiovascular:  No chest pain, dyspnea on exertion, edema, orthopnea, paroxysmal nocturnal dyspnea. Positive for palpitations.  Dermatological: No rash, lesions/masses Respiratory: No cough, dyspnea Urologic: No hematuria, dysuria Abdominal:   No nausea, vomiting, diarrhea, bright red blood per rectum, melena, or hematemesis. Neurologic:  No visual changes, wkns, changes in mental status. Positive for dizziness.   All other systems reviewed and are otherwise negative except as noted above.  Physical Exam    Vitals:   01/21/19 1309 01/21/19 1335  BP: 116/77   Pulse: 85   Resp: 17   Temp: 99.1 F (37.3 C)   TempSrc: Oral   SpO2: 95% 96%   No intake or output data in the 24 hours ending 01/21/19 1358 There were no vitals filed for this visit. There is no height or weight on file to calculate BMI.   General: Well developed, well nourished,male appearing in no acute distress. Head: Normocephalic, atraumatic, sclera non-icteric, no xanthomas, nares are without discharge. Neck: No carotid bruits. JVD not elevated.  Lungs: Respirations regular and unlabored, without wheezes or rales.  Heart: Regular rate and rhythm. No S3 or S4.  No murmur, no rubs, or gallops appreciated. Abdomen: Soft, non-tender, non-distended with normoactive bowel sounds. No hepatomegaly. No rebound/guarding. No obvious abdominal masses. Msk:  Strength and tone appear normal for age. No joint deformities or effusions. Extremities: No clubbing or cyanosis. No lower extremity edema.  Distal pedal pulses are 2+ bilaterally. Neuro: Alert and oriented X 3. Moves all extremities spontaneously. No focal deficits noted. Psych:  Responds to questions appropriately with a normal  affect. Skin: No rashes or lesions noted  Labs and Radiology Studies    EKG:  The ECG that was done was personally reviewed and demonstrates NSR, HR 71 with 1st degree AV Block.   Relevant CV Studies:  Limited Echo: 09/2016 Study Conclusions   - Left ventricle: The cavity size was normal. Systolic function was   normal. The estimated ejection fraction was in the range of 60%   to 65%. Wall motion was normal; there were no regional wall   motion abnormalities. Doppler parameters are consistent with   abnormal left ventricular relaxation (grade 1 diastolic   dysfunction). There was no evidence of elevated ventricular   filling pressure by Doppler parameters. - Aortic valve: Trileaflet; normal thickness leaflets. There was no   regurgitation. - Mitral valve: There was trivial regurgitation. - Right ventricle: The cavity size was normal. Wall thickness was   normal. Systolic function was normal. - Tricuspid valve: There was no regurgitation. - Pulmonary arteries: Systolic pressure could not be accurately   estimated. - Inferior vena cava: The vessel was normal in size. - Pericardium, extracardiac: There was no pericardial effusion.  Laboratory Data:  ChemistryNo results for input(s): NA, K, CL, CO2, GLUCOSE, BUN, CREATININE, CALCIUM, GFRNONAA, GFRAA, ANIONGAP in the last 168 hours.  No results for input(s): PROT, ALBUMIN, AST, ALT, ALKPHOS, BILITOT in the last 168 hours. HematologyNo results for input(s): WBC, RBC, HGB, HCT, MCV, MCH, MCHC, RDW, PLT in the last 168 hours. Cardiac EnzymesNo results for input(s): TROPONINI in the last 168 hours. No results for input(s): TROPIPOC in the last 168 hours.  BNPNo results for input(s): BNP, PROBNP in the last 168 hours.  DDimer No results for input(s): DDIMER in the last 168 hours.  Radiology/Studies:  No results found.  Assessment and Plan:   1. Recurrent SVT - this has been a recurrent issue for the patient as he is very  symptomatic when in the arrhythmia with associated hypotension. Received 6mg  of Adenosine followed by 12mg  and an additional 12mg  by EMS without conversion and underwent cardioversion while in the field without success but converted en route.  - Hgb stable at 13.8. BMET, TSH, Mg and Troponin currently pending. EKG shows NSR, HR 71 with 1st degree AV Block. SBP in the low-100's on most recent check.  - he is scheduled for his ablation procedure on Monday. Discussed with Dr. Sallyanne Kuster and will admit the patient for monitoring over the weekend until his procedure on Monday. He is on Coreg 25mg  BID prior to admission. Given current hypotension will reduce dosing (was on 25mg  BID prior to admission) to 6.25mg  BID with hold parameters in place.   2. History of Atrial Fibrillation - currently in NSR. Continue Eliquis 5mg  BID for anticoagulation.   3. HTN - he is actually hypotensive with SBP in the 80's to low-100's. Order 500 mL bolus. Will hold PTA Amlodipine, Losartan and Spironolactone with hold parameters in place for Coreg.   4. HLD - continue PTA Atorvastatin 40mg  daily.   5. Known OSA - reports being intolerant to CPAP in the past. Would recommend a repeat sleep study as an outpatient given his recurrent arrhythmias.    Severity of Illness: The appropriate patient status for this patient is INPATIENT. Inpatient status is judged to be reasonable and necessary in order to provide the required intensity of service to ensure the patient's safety. The patient's presenting symptoms, physical exam findings, and initial radiographic and laboratory data in the context of their chronic comorbidities is felt to place them at high risk for further clinical deterioration. Furthermore, it is not anticipated that the patient will be medically stable for discharge from the hospital within 2 midnights of admission. The following factors support the patient status of inpatient.   " The patient's presenting  symptoms include palpitations, chest pain, dizziness. " The worrisome physical exam findings include hypotension. " The initial radiographic and laboratory data are worrisome because of recurrent SVT. " The chronic co-morbidities include HTN, HLD, OSA and history of atrial fibrillation.   * I certify that at the point of admission it is my clinical judgment that the patient will require inpatient hospital care spanning beyond 2 midnights from the point of admission due to high intensity of service, high risk for further deterioration and high frequency of surveillance required.*    For questions or updates, please contact [place] Please consult [email or web address] for contact info under Cardiology/STEMI.   Signed, Erma Heritage, PA-C 01/21/2019, 1:58 PM Pager: [phone]  I have seen and examined the patient along with Erma Heritage, PA-C.  I have reviewed the chart, notes and new data.  I agree with PA/NP's note.  Key new complaints: near-syncope during tachycardia, now asymptomatic Key examination changes: normal CV exam Key new findings / data: ECG from EMS with regular narrow complex tachycardia, no clear P waves.  PLAN: He has had recurrent highly symptomatic and hemodynamically unstable SVT that  broke through on high beta blocker dose, responds poorly to conventional antiarrhythmic therapy and even DCCV. Recommend hospitalization on telemetry and EPS/RFA on Monday. BP has rebounded now (115/88). Will place on the original carvedilol dose.  Sanda Klein, MD, [place] [phone] 01/21/2019, 3:00 PM

## 2019-01-21 NOTE — Progress Notes (Signed)
   Patient's nurse called to report the patient had been informed by Dr. Lovena Le to not take his Coreg this weekend in leading up to his ablation on Monday. Reviewed with Dr. Sallyanne Kuster and will hold for now.   Signed, Erma Heritage, PA-C 01/21/2019, 4:12 PM Pager: [phone]

## 2019-01-22 MED ORDER — SODIUM CHLORIDE 0.9 % IV SOLN: INTRAVENOUS | Status: DC

## 2019-01-22 MED ORDER — ONDANSETRON HCL 4 MG/2ML IJ SOLN
4.0000 mg | Freq: Four times a day (QID) | INTRAMUSCULAR | Status: DC | PRN
  Administered 2019-01-22: 4 mg via INTRAVENOUS

## 2019-01-22 NOTE — Plan of Care (Signed)
  Problem: Health Behavior/Discharge Planning: Goal: Ability to manage health-related needs will improve Outcome: Progressing   Problem: Clinical Measurements: Goal: Ability to maintain clinical measurements within normal limits will improve Outcome: Progressing Goal: Will remain free from infection Outcome: Progressing Goal: Diagnostic test results will improve Outcome: Progressing Goal: Respiratory complications will improve Outcome: Progressing Goal: Cardiovascular complication will be avoided Outcome: Progressing   Problem: Activity: Goal: Risk for activity intolerance will decrease Outcome: Progressing   Problem: Nutrition: Goal: Adequate nutrition will be maintained Outcome: Progressing   Problem: Coping: Goal: Level of anxiety will decrease Outcome: Progressing   Problem: Elimination: Goal: Will not experience complications related to bowel motility Outcome: Progressing Goal: Will not experience complications related to urinary retention Outcome: Progressing   Problem: Pain Managment: Goal: General experience of comfort will improve Outcome: Progressing   Problem: Safety: Goal: Ability to remain free from injury will improve Outcome: Progressing   Problem: Skin Integrity: Goal: Risk for impaired skin integrity will decrease Outcome: Progressing   Problem: Education: Goal: Understanding of disease, treatment, and recovery process will improve Outcome: Progressing   Problem: Activity: Goal: Ability to return to baseline activity level will improve Outcome: Progressing   Problem: Cardiac: Goal: Ability to maintain adequate cardiovascular perfusion will improve Outcome: Progressing Goal: Vascular access site(s) Level 0-1 will be maintained Outcome: Progressing   Problem: Health Behavior/ Discharge Planning: Goal: Ability to safely manage health related needs after discharge Outcome: Progressing

## 2019-01-22 NOTE — Progress Notes (Signed)
Progress Note  Patient Name: Brandon [name]. Date of Encounter: 01/22/2019  Primary Cardiologist: Lovena Le  Subjective   No further arrhythmia overnight.  Feels okay.  Sinus tachycardia (possibly some beta-blocker rebound).  Inpatient Medications    Scheduled Meds: . apixaban  5 mg Oral BID  . atorvastatin  40 mg Oral q1800  . escitalopram  20 mg Oral Daily  . gabapentin  800 mg Oral BID  . Melatonin  6 mg Oral QHS  . tamsulosin  0.4 mg Oral BID   Continuous Infusions:  PRN Meds: acetaminophen   Vital Signs    Vitals:   01/21/19 1937 01/21/19 2308 01/22/19 0354 01/22/19 0718  BP: (!) 142/88 131/79 (!) 147/91 (!) 155/98  Pulse: 69 64 69 80  Resp: 13 14 19 11   Temp: 98.3 F (36.8 C) 97.7 F (36.5 C) 97.8 F (36.6 C) 98.5 F (36.9 C)  TempSrc: Oral Oral Oral Oral  SpO2: 95% 95% 99% 98%  Weight:      Height:        Intake/Output Summary (Last 24 hours) at 01/22/2019 0928 Last data filed at 01/21/2019 2300 Gross per 24 hour  Intake 1340 ml  Output 350 ml  Net 990 ml   Last 3 Weights 01/21/2019 01/04/2019 12/27/2018  Weight (lbs) 300 lb 7.8 oz 302 lb 3.2 oz 285 lb  Weight (kg) 136.3 kg 137.077 kg 129.275 kg      Telemetry    Sinus tachycardia- Personally Reviewed  ECG    No new tracing- Personally Reviewed  Physical Exam  Appears well GEN: No acute distress.   Neck: No JVD Cardiac: RRR, no murmurs, rubs, or gallops.  Respiratory: Clear to auscultation bilaterally. GI: Soft, nontender, non-distended  MS: No edema; No deformity. Neuro:  Nonfocal  Psych: Normal affect   Labs    High Sensitivity Troponin:   Recent Labs  Lab 12/27/18 1034 12/28/18 1147 12/28/18 1500 01/21/19 1338 01/21/19 1534  TROPONINIHS 37* 597* 590* 25* 52*      Chemistry Recent Labs  Lab 01/21/19 1338  NA 140  K 3.6  CL 107  CO2 26  GLUCOSE 94  BUN 10  CREATININE 0.81  CALCIUM 8.3*  PROT 6.0*  ALBUMIN 3.5  AST 16  ALT 15  ALKPHOS 74  BILITOT 0.7   GFRNONAA >60  GFRAA >60  ANIONGAP 7     Hematology Recent Labs  Lab 01/21/19 1338  WBC 7.4  RBC 4.55  HGB 13.8  HCT 42.2  MCV 92.7  MCH 30.3  MCHC 32.7  RDW 12.8  PLT 145*    BNP Recent Labs  Lab 01/21/19 1338  BNP 115.2*     DDimer No results for input(s): DDIMER in the last 168 hours.   Radiology    DG Chest Portable 1 View  Result Date: 01/21/2019 CLINICAL DATA:  Palpitations, SVT, chest pain EXAM: PORTABLE CHEST 1 VIEW COMPARISON:  12/28/2018 FINDINGS: Stable cardiomegaly without focal pneumonia, collapse or consolidation. Negative for edema, large effusion or pneumothorax. Trachea midline. Aorta atherosclerotic. Degenerative changes of the spine and shoulders. IMPRESSION: Stable cardiomegaly without interval change or superimposed acute process. Electronically Signed   By: Jerilynn Mages.  Shick M.D.   On: 01/21/2019 14:05    Cardiac Studies   Limited Echo: 09/2016 Study Conclusions  - Left ventricle: The cavity size was normal. Systolic function was normal. The estimated ejection fraction was in the range of 60% to 65%. Wall motion was normal; there were no regional wall  motion abnormalities. Doppler parameters are consistent with abnormal left ventricular relaxation (grade 1 diastolic dysfunction). There was no evidence of elevated ventricular filling pressure by Doppler parameters. - Aortic valve: Trileaflet; normal thickness leaflets. There was no regurgitation. - Mitral valve: There was trivial regurgitation. - Right ventricle: The cavity size was normal. Wall thickness was normal. Systolic function was normal. - Tricuspid valve: There was no regurgitation. - Pulmonary arteries: Systolic pressure could not be accurately estimated. - Inferior vena cava: The vessel was normal in size. - Pericardium, extracardiac: There was no pericardial effusion.   Patient Profile     69 y.o. male with history of paroxysmal atrial fibrillation and now  with recurrent highly symptomatic supraventricular tachycardia despite beta-blocker therapy, associated with significant hypotension.  Scheduled for EPS/RFA tomorrow.  Additional problems include hypertension, hyperlipidemia and obstructive sleep apnea intolerant to CPAP.  Assessment & Plan    Beta-blocker held in anticipation of EP study tomorrow. We will hold Eliquis in anticipation of the procedure.  For questions or updates, please contact [place] Please consult [email or web address] for contact info under        Signed, Sanda Klein, MD  01/22/2019, 9:28 AM

## 2019-01-22 NOTE — Discharge Instructions (Addendum)
Post procedure care instructions No driving for 4 days. No lifting over 5 lbs for 1 week. No vigorous or sexual activity for 1 week. You may return to work/usual activities on 01/30/2019. Keep procedure site clean & dry. If you notice increased pain, swelling, bleeding or pus, call/return!  You may shower, but no soaking baths/hot tubs/pools for 1 week.     Information on my medicine - ELIQUIS (apixaban)  Why was Eliquis prescribed for you? Eliquis was prescribed for you to reduce the risk of a blood clot forming that can cause a stroke if you have a medical condition called atrial fibrillation (a type of irregular heartbeat).  What do You need to know about Eliquis ? Take your Eliquis TWICE DAILY - one tablet in the morning and one tablet in the evening with or without food. If you have difficulty swallowing the tablet whole please discuss with your pharmacist how to take the medication safely.  Take Eliquis exactly as prescribed by your doctor and DO NOT stop taking Eliquis without talking to the doctor who prescribed the medication.  Stopping may increase your risk of developing a stroke.  Refill your prescription before you run out.  After discharge, you should have regular check-up appointments with your healthcare provider that is prescribing your Eliquis.  In the future your dose may need to be changed if your kidney function or weight changes by a significant amount or as you get older.  What do you do if you miss a dose? If you miss a dose, take it as soon as you remember on the same day and resume taking twice daily.  Do not take more than one dose of ELIQUIS at the same time to make up a missed dose.  Important Safety Information A possible side effect of Eliquis is bleeding. You should call your healthcare provider right away if you experience any of the following: ? Bleeding from an injury or your nose that does not stop. ? Unusual colored urine (red or dark brown) or  unusual colored stools (red or black). ? Unusual bruising for unknown reasons. ? A serious fall or if you hit your head (even if there is no bleeding).  Some medicines may interact with Eliquis and might increase your risk of bleeding or clotting while on Eliquis. To help avoid this, consult your healthcare provider or pharmacist prior to using any new prescription or non-prescription medications, including herbals, vitamins, non-steroidal anti-inflammatory drugs (NSAIDs) and supplements.  This website has more information on Eliquis (apixaban): http://www.eliquis.com/eliquis/home

## 2019-01-23 ENCOUNTER — Encounter (HOSPITAL_COMMUNITY)
Admission: EM | Disposition: A | Discharge status: Home / Self Care | Payer: Self-pay | Source: Home / Self Care | Attending: Cardiovascular Disease

## 2019-01-23 ENCOUNTER — Ambulatory Visit (HOSPITAL_COMMUNITY): Admission: RE | Admit: 2019-01-23 | Payer: Medicare Other | Source: Home / Self Care | Admitting: Internal Medicine

## 2019-01-23 HISTORY — PX: SVT ABLATION: EP1225

## 2019-01-23 LAB — HIV ANTIBODY (ROUTINE TESTING W REFLEX): HIV Screen 4th Generation wRfx: NONREACTIVE

## 2019-01-23 SURGERY — SVT ABLATION

## 2019-01-23 MED ORDER — MIDAZOLAM HCL 5 MG/5ML IJ SOLN
INTRAMUSCULAR | Status: AC
  Filled 2019-01-23: qty 5

## 2019-01-23 MED ORDER — BUPIVACAINE HCL (PF) 0.25 % IJ SOLN
INTRAMUSCULAR | Status: AC
  Filled 2019-01-23: qty 60

## 2019-01-23 MED ORDER — HEPARIN (PORCINE) IN NACL 1000-0.9 UT/500ML-% IV SOLN
INTRAVENOUS | Status: DC | PRN
  Administered 2019-01-23: 500 mL

## 2019-01-23 MED ORDER — FENTANYL CITRATE (PF) 100 MCG/2ML IJ SOLN
INTRAMUSCULAR | Status: AC
  Filled 2019-01-23: qty 2

## 2019-01-23 MED ORDER — OFF THE BEAT BOOK
Freq: Once | Status: DC
  Filled 2019-01-23: qty 1

## 2019-01-23 MED ORDER — FENTANYL CITRATE (PF) 100 MCG/2ML IJ SOLN
INTRAMUSCULAR | Status: DC | PRN
  Administered 2019-01-23: 25 ug via INTRAVENOUS
  Administered 2019-01-23: 12.5 ug via INTRAVENOUS
  Administered 2019-01-23: 25 ug via INTRAVENOUS
  Administered 2019-01-23 (×2): 12.5 ug via INTRAVENOUS

## 2019-01-23 MED ORDER — SODIUM CHLORIDE 0.9% FLUSH: 3.0000 mL | INTRAVENOUS | Status: DC | PRN

## 2019-01-23 MED ORDER — HEPARIN (PORCINE) IN NACL 1000-0.9 UT/500ML-% IV SOLN
INTRAVENOUS | Status: AC
  Filled 2019-01-23: qty 500

## 2019-01-23 MED ORDER — SODIUM CHLORIDE 0.9% FLUSH
3.0000 mL | Freq: Two times a day (BID) | INTRAVENOUS | Status: DC
  Administered 2019-01-23: 3 mL via INTRAVENOUS

## 2019-01-23 MED ORDER — ONDANSETRON HCL 4 MG/2ML IJ SOLN: 4.0000 mg | Freq: Four times a day (QID) | INTRAMUSCULAR | Status: DC | PRN

## 2019-01-23 MED ORDER — ACETAMINOPHEN 325 MG PO TABS: 650.0000 mg | ORAL_TABLET | ORAL | Status: DC | PRN

## 2019-01-23 MED ORDER — BUPIVACAINE HCL (PF) 0.25 % IJ SOLN
INTRAMUSCULAR | Status: DC | PRN
  Administered 2019-01-23: 60 mL

## 2019-01-23 MED ORDER — MIDAZOLAM HCL 5 MG/5ML IJ SOLN
INTRAMUSCULAR | Status: DC | PRN
  Administered 2019-01-23 (×2): 1 mg via INTRAVENOUS
  Administered 2019-01-23: 2 mg via INTRAVENOUS
  Administered 2019-01-23 (×3): 1 mg via INTRAVENOUS

## 2019-01-23 MED ORDER — CARVEDILOL 25 MG PO TABS
25.0000 mg | ORAL_TABLET | Freq: Two times a day (BID) | ORAL | Status: DC
  Administered 2019-01-23: 25 mg via ORAL
  Filled 2019-01-23: qty 1

## 2019-01-23 SURGICAL SUPPLY — 12 items
BAG SNAP BAND KOVER 36X36 (MISCELLANEOUS) ×2 IMPLANT
CATH EZ STEER NAV 4MM F-J CUR (ABLATOR) ×2 IMPLANT
CATH HEX JOS 2-5-2 65CM 6F REP (CATHETERS) ×2 IMPLANT
CATH JOSEPH QUAD ALLRED 6F REP (CATHETERS) ×4 IMPLANT
PACK EP LATEX FREE (CUSTOM PROCEDURE TRAY) ×1
PACK EP LF (CUSTOM PROCEDURE TRAY) ×1 IMPLANT
PAD PRO RADIOLUCENT 2001M-C (PAD) ×2 IMPLANT
PATCH CARTO3 (PAD) ×2 IMPLANT
SHEATH PINNACLE 6F 10CM (SHEATH) ×4 IMPLANT
SHEATH PINNACLE 7F 10CM (SHEATH) ×2 IMPLANT
SHEATH PINNACLE 8F 10CM (SHEATH) ×2 IMPLANT
SHIELD RADPAD SCOOP 12X17 (MISCELLANEOUS) ×2 IMPLANT

## 2019-01-23 NOTE — Progress Notes (Signed)
Transported pt to cath lab for procedure

## 2019-01-23 NOTE — H&P (Signed)
Primary Care Physician: Danna Hefty, DO  Referring Physician: Dr. Raynaldo Opitz [place] [name] is a 69 y.o. male with a h/o afib, s/p ablation, maintaining SR, off amiodarone. He had one episode of afib in July, none since then He has gained 20 lbs since Covid inactivity. He had rt knee replacement in May.  He recently had 2 separet ER visits for SVT with RVR and very hypotensive. The first time in ER, he was given adenosine and broke but with return of SVT, he was cardioverted. He went home only to have the same scenario to repeat again the next day. This time he was kept and treated. These episodes may have been set off by pt not having his coreg 25 mg bid for a week as he was waiting for refills. He was given amiodarone IV but when he returned to SR, this was stopped and he was gotten back on his BB. Pt states that Dr. Lovena Le wanted to see him after discharge to discuss SVT ablation. CHA2DS2VASc of 2, on eliquis.  Today, he denies symptoms of palpitations, chest pain, shortness of breath, orthopnea, PND, lower extremity edema, dizziness, presyncope, syncope, or neurologic sequela. The patient is tolerating medications without difficulties and is otherwise without complaint today.      Past Medical History:  Diagnosis Date  . Anxiety   . Arthritis   . Benign neoplasm of colon   . Benign prostatic hyperplasia with urinary obstruction   . Bilateral pulmonary embolism ([place]) 3/14   admitted to [facility], treated with Xarelto  . Cataract   . Chronic pain   . History of kidney stones   . History of pulmonary embolism 03/19/2012  . Hyperlipemia   . Hypertension   . Insomnia   . Major depressive disorder, recurrent episode ([place])   . Mitral regurgitation 04/24/2013   Mild by TEE  . Obesity   . OSA (obstructive sleep apnea)    noncompliant with CPAP. 07/27/13- awaiting a CPAP- unable to tolerate mask  . Osteoporosis   . Persistent atrial fibrillation ([place])   . Restless leg syndrome    takes  gabapentin  . Sciatica   . SVT (supraventricular tachycardia) ([place]) 03/07/2016  . Thrombus of left atrial appendage 03/09/2013  . Ventral hernia, unspecified, without mention of obstruction or gangrene    right abdominal wall        Past Surgical History:  Procedure Laterality Date  . CARDIOVERSION N/A 03/21/2012   Procedure: CARDIOVERSION; Surgeon: Birdie Riddle, MD; Location: [facility] ENDOSCOPY; Service: Cardiovascular; Laterality: N/A;  . CARDIOVERSION N/A 04/24/2013   Procedure: CARDIOVERSION; Surgeon: Dorothy Spark, MD; Location: [facility] ENDOSCOPY; Service: Cardiovascular; Laterality: N/A;  . CARDIOVERSION N/A 06/27/2015   Procedure: CARDIOVERSION; Surgeon: Larey Dresser, MD; Location: [place]; Service: Cardiovascular; Laterality: N/A;  . CATARACT EXTRACTION    . COLONOSCOPY W/ POLYPECTOMY    . ELECTROPHYSIOLOGIC STUDY N/A 07/30/2015   Procedure: Atrial Fibrillation Ablation; Surgeon: Thompson Grayer, MD; Location: [place] CV LAB; Service: Cardiovascular; Laterality: N/A;  . EXTRACORPOREAL SHOCK WAVE LITHOTRIPSY Right 10/29/2016   Procedure: RIGHT EXTRACORPOREAL SHOCK WAVE LITHOTRIPSY (ESWL); Surgeon: Alexis Frock, MD; Location: [facility] ORS; Service: Urology; Laterality: Right;  . EYE SURGERY Right    cataract  . FEMUR FRACTURE SURGERY    . HERNIA REPAIR  07/06/2011  . INGUINAL HERNIA REPAIR Right 07/28/2013   Procedure: RIGHT INGUINAL HERNIA REPAIR; Surgeon: Imogene Burn. Georgette Dover, MD; Location: [place]; Service: General; Laterality: Right;  . INGUINAL HERNIA REPAIR  Right 09/22/2016   Procedure: LAPAROSCOPIC RIGHT INGUINAL HERNIA; Surgeon: Kieth Brightly, Arta Bruce, MD; Location: [facility] ORS; Service: General; Laterality: Right; With MESH  . INSERTION OF MESH Right 07/28/2013   Procedure: INSERTION OF MESH; Surgeon: Imogene Burn. Georgette Dover, MD; Location: [place]; Service: General; Laterality: Right;  . KNEE ARTHROSCOPY     left  . REPLACEMENT TOTAL KNEE Left   . ROTATOR CUFF REPAIR     right  . ROTATOR CUFF  REPAIR Left 03/08/2014   DR SUPPLE  . SHOULDER ARTHROSCOPY WITH ROTATOR CUFF REPAIR AND SUBACROMIAL DECOMPRESSION Left 03/08/2014   Procedure: LEFT SHOULDER ARTHROSCOPY WITH ROTATOR CUFF REPAIR/SUBACROMIAL DECOMPRESSION/DISTAL CLAVICLE RESECTION; Surgeon: Marin Shutter, MD; Location: [place]; Service: Orthopedics; Laterality: Left;  . TEE WITHOUT CARDIOVERSION N/A 03/21/2012   Procedure: TRANSESOPHAGEAL ECHOCARDIOGRAM (TEE); Surgeon: Birdie Riddle, MD; Location: [place]; Service: Cardiovascular; Laterality: N/A;  . TEE WITHOUT CARDIOVERSION N/A 04/24/2013   Procedure: TRANSESOPHAGEAL ECHOCARDIOGRAM (TEE); Surgeon: Dorothy Spark, MD; Location: [place]; Service: Cardiovascular; Laterality: N/A;  . TEE WITHOUT CARDIOVERSION N/A 07/29/2015   Procedure: TRANSESOPHAGEAL ECHOCARDIOGRAM (TEE); Surgeon: Fay Records, MD; Location: [facility] ENDOSCOPY; Service: Cardiovascular; Laterality: N/A;  . TONSILLECTOMY    . TOTAL KNEE ARTHROPLASTY Right 05/24/2018   Procedure: RIGHT TOTAL KNEE ARTHROPLASTY; Surgeon: Paralee Cancel, MD; Location[facility]; Service: Orthopedics; Laterality: Right; 70 mins        Allergies  Allergen Reactions  . Ace Inhibitors Cough  . Albuterol Other (See Comments)    Racing heart   Social History        Socioeconomic History  . Marital status: Married    Spouse name: Not on file  . Number of children: Not on file  . Years of education: Not on file  . Highest education level: Not on file  Occupational History  . Not on file  Tobacco Use  . Smoking status: Former Smoker    Packs/day: 0.50    Years: 5.00    Pack years: 2.50    Types: Cigarettes    Quit date: 1976    Years since quitting: 45.0  . Smokeless tobacco: Never Used  Substance and Sexual Activity  . Alcohol use: No    Comment: Former EtOH abuse, stopped 06/2016  . Drug use: No    Comment: negative hx for IV drug abuse  . Sexual activity: Not on file  Other Topics Concern  . Not on file  Social History  Narrative   Lives with wife in [place]   Retired Dietitian   Wife lives in town (still married but lives separate)   Has biological daughter in [place] ([place])   Social Determinants of Health      Financial Resource Strain:   . Difficulty of Paying Living Expenses: Not on file  Food Insecurity:   . Worried About Charity fundraiser in the Last Year: Not on file  . Ran Out of Food in the Last Year: Not on file  Transportation Needs:   . Lack of Transportation (Medical): Not on file  . Lack of Transportation (Non-Medical): Not on file  Physical Activity:   . Days of Exercise per Week: Not on file  . Minutes of Exercise per Session: Not on file  Stress:   . Feeling of Stress : Not on file  Social Connections:   . Frequency of Communication with Friends and Family: Not on file  . Frequency of Social Gatherings with Friends and Family: Not on file  .  Attends Religious Services: Not on file  . Active Member of Clubs or Organizations: Not on file  . Attends Archivist Meetings: Not on file  . Marital Status: Not on file  Intimate Partner Violence:   . Fear of Current or Ex-Partner: Not on file  . Emotionally Abused: Not on file  . Physically Abused: Not on file  . Sexually Abused: Not on file        Family History  Problem Relation Age of Onset  . Cancer Father    bone  . Heart failure Mother   . Hypertension Mother   . Asthma Mother   . Cancer Paternal Grandmother    ovarian  . CVA Other    Fam Hx of multiple myeloma  . Diabetes Other    Fam Hx of DM   ROS- All systems are reviewed and negative except as per the HPI above  Physical Exam:     Vitals:   01/04/19 1031  BP: 122/78  Pulse: 69  Weight: (!) 137.1 kg  Height: _0  (1.93 m)   GEN- The patient is well appearing, alert and oriented x 3 today.  Head- normocephalic, atraumatic  Eyes- Sclera clear, conjunctiva pink  Ears- hearing intact  Oropharynx- clear  Neck- supple, no  JVP  Lymph- no cervical lymphadenopathy  Lungs- Clear to ausculation bilaterally, normal work of breathing  Heart- Regular rate and rhythm, no murmurs, rubs or gallops, PMI not laterally displaced  GI- soft, NT, ND, + BS  Extremities- no clubbing, cyanosis, or edema  MS- no significant deformity or atrophy  Skin- no rash or lesion  Psych- euthymic mood, full affect  Neuro- strength and sensation are intact  EKG- SR at 69 bpm, PR int 210m, Qrs int 112 ms, QTc 426 ms  Assessment and Plan:  1. SVT/afib  [facility]  Trigger may have been being of coreg x one week waiting for refills  No further episodes  Dr. TLovena Lediscussed ablation with pt and will get appointment with pt to f/u to further discuss  2. HTN  Stable  3. CHA2DS2VASc score of 2  Continue eliquis 5 mg bid  F/u with Dr. TLovena Leas requested  DGeroge Baseman [facility] [address] [place] [address] [phone] EP Attending  Patient seen and examined. Agree with the findings as noted above. The patient was scheduled to present today for EPS/RFA of SVT. He developed incessant SVT and presented 2 days ago and now is ready to undergo EPS/RFA of SVT. I have reviewed the indications/risks/benefits/goals/expectations of EP study and ablation and he wishes to proceed.  GMikle BosworthD.

## 2019-01-23 NOTE — Progress Notes (Signed)
Explained and discussed discharge instructions to patient. No medication changes to start eliquis tomorrow. Rt groin site level 0 no bleeding, Rt pedal pulse bounding. Pt walked to bathroom and out to nursing station. Pt going home with a home care nurse overnite from private agency. No complaints.

## 2019-01-23 NOTE — Discharge Summary (Addendum)
DISCHARGE SUMMARY    Patient ID: Brandon Lombardozzi.,  MRN: [id number], DOB/AGE: 1950-05-11 69 y.o.  Admit date: 01/21/2019 Discharge date: 01/23/2019  Primary Care Physician: Danna Hefty, DO  Electrophysiologist: Dr. Rayann Heman  Primary Discharge Diagnosis:  1. SVT  Secondary Discharge Diagnosis:  1. HTN 2. Persistent AFib 3. H/o PE and LAA thrombus     CHA2DS2Vasc is 2, on Eliquis  Allergies  Allergen Reactions  . Ace Inhibitors Cough  . Albuterol Other (See Comments)    Racing heart     Procedures This Admission:  1. 01/23/2019, EPS/ablation w/Dr. Lovena Le  Brief HPI: Brandon Dar. is a 69 y.o. male with a hx of HTN, HLD, persistent AFib (PVI ablation 2017, Dr. Rayann Heman), SVT, h/o b/l PE and LAA thrombus (on lifelong a/c), untreated OSA, brought to [facility] via EMS with recurrent, symptomatic SVT.   Hospital Course:  The patient was recently seen in the ER 12/27/2018 with his SVT, brought via EMS, he is very symptomatic with his SVT, dizziness, weakness, EMS found him in SVT. He was given adenosine 6 mg and then 12 mg without relief. Systolic blood pressure in 70s>>given fluids. Cardioverted with 100 J however unsuccessful during enroute. However he had a successful cardioversion in ER at 200 J and he was discharged He returned  With recurrent symptoms/SVT the next day EMS found him again in SVT at 171 bpm. He was given adenosine 6 mg x 1 then12 mgx2.He converted to sinus rhythm after second dose of adenosine 12 mg. However again went into SVT/A. fib in ER and started on IV amiodarone with bolus with successful conversion. These occurred in the absence of his coreg for a week.  He was discharged back in his coreg and planned for EPS/ablation electively.  He was actually scheduled to come in for his EPS with Dr. Lovena Le Monday 01/23/19, though Saturday developed symptoms, HR by his Kardia device 150's, by chart notes, EMS arrival, he was in SVT and received 6mg  of  Adenosine followed by 12mg  and an additional 12mg  without conversion. Cardioversion attempted by EMS without success but converted later in route.  Labs were unremarkable, has mildly elevated HS Trop felt to be demand in setting of his SVT and a shock.  Was decided given so symptomatic with his SVT to admit him and proceed with planned EPS/ablation.    He underwent his procedure 01/23/2019, (see full procedure report for full details)  Site care and restrictions were reviewed with the patient.  The patient was examined by Dr. Lovena Le and considered stable for discharge to home once bed rest is completed.  R groin is soft, non-tender, no bleeding, no hematoma.  He feels well, no CP no SOB.  Routine post procedure follow up is in place   Physical Exam: Vitals:   01/23/19 1400 01/23/19 1430 01/23/19 1500 01/23/19 1521  BP: 128/76 (!) 143/101 (!) 146/85 (!) 152/94  Pulse: 93 93 (!) 118 94  Resp: 11 16 17    Temp:    98.3 F (36.8 C)  TempSrc:    Oral  SpO2: 92% 93% 95% 93%  Weight:      Height:         Labs:   Lab Results  Component Value Date   WBC 7.4 01/21/2019   HGB 13.8 01/21/2019   HCT 42.2 01/21/2019   MCV 92.7 01/21/2019   PLT 145 (L) 01/21/2019    Recent Labs  Lab 01/21/19 1338  NA  140  K 3.6  CL 107  CO2 26  BUN 10  CREATININE 0.81  CALCIUM 8.3*  PROT 6.0*  BILITOT 0.7  ALKPHOS 74  ALT 15  AST 16  GLUCOSE 94    Discharge Medications:  Allergies as of 01/23/2019      Reactions   Ace Inhibitors Cough   Albuterol Other (See Comments)   Racing heart      Medication List    TAKE these medications   amLODipine 5 MG tablet Commonly known as: NORVASC TAKE 1 TABLET BY MOUTH EVERY DAY What changed: when to take this   apixaban 5 MG Tabs tablet Commonly known as: Eliquis Take 1 tablet (5 mg total) by mouth 2 (two) times daily. Notes to patient: Resume Tuesday 01/24/2019 morning   atorvastatin 40 MG tablet Commonly known as: LIPITOR Take 1 tablet  (40 mg total) by mouth daily.   carvedilol 25 MG tablet Commonly known as: COREG TAKE 1 TABLET (25 MG TOTAL) BY MOUTH 2 (TWO) TIMES DAILY WITH A MEAL.   diphenhydramine-acetaminophen 25-500 MG Tabs tablet Commonly known as: TYLENOL PM Take 3 tablets by mouth at bedtime as needed (sleep).   escitalopram 20 MG tablet Commonly known as: LEXAPRO TAKE 1 TABLET BY MOUTH EVERY DAY   gabapentin 800 MG tablet Commonly known as: NEURONTIN Take 1 tablet (800 mg total) by mouth See admin instructions. Takes 1 tablet in the evening and 1 tablet at bedtime. What changed: additional instructions   losartan 100 MG tablet Commonly known as: COZAAR TAKE 1 TABLET BY MOUTH EVERY DAY   METAMUCIL PO Take 3 Scoops by mouth daily as needed (constipation).   spironolactone 25 MG tablet Commonly known as: ALDACTONE Take 1 tablet (25 mg total) by mouth daily. What changed: when to take this   tamsulosin 0.4 MG Caps capsule Commonly known as: FLOMAX Take 1 capsule (0.4 mg total) by mouth 2 (two) times daily.       Disposition: Home   Follow-up Information    Evans Lance, MD Follow up.   Specialty: Cardiology Why: 02/20/2019 @ 12:30PM Contact information: [id number] [place] [address] [phone]           Duration of Discharge Encounter: Greater than 30 minutes including physician time.  Venetia Night, PA-C 01/23/2019 4:12 PM  EP attending  Patient seen and examined.  He is status post catheter ablation of AV node reentrant tachycardia.  His groin demonstrates no hematoma.  ECG demonstrates sinus rhythm.  His vitals are stable.  He will be discharged home with the usual follow-up.  I have encouraged him to undertake no strenuous physical activity for 1 week.  Brandon Peru, MD

## 2019-01-23 NOTE — Plan of Care (Signed)
  Problem: Health Behavior/Discharge Planning: Goal: Ability to manage health-related needs will improve Outcome: Progressing   Problem: Clinical Measurements: Goal: Ability to maintain clinical measurements within normal limits will improve Outcome: Progressing Goal: Will remain free from infection Outcome: Progressing Goal: Diagnostic test results will improve Outcome: Progressing Goal: Respiratory complications will improve Outcome: Progressing Goal: Cardiovascular complication will be avoided Outcome: Progressing   Problem: Activity: Goal: Risk for activity intolerance will decrease Outcome: Progressing   Problem: Nutrition: Goal: Adequate nutrition will be maintained Outcome: Progressing   Problem: Coping: Goal: Level of anxiety will decrease Outcome: Progressing   Problem: Elimination: Goal: Will not experience complications related to bowel motility Outcome: Progressing Goal: Will not experience complications related to urinary retention Outcome: Progressing   Problem: Pain Managment: Goal: General experience of comfort will improve Outcome: Progressing   Problem: Safety: Goal: Ability to remain free from injury will improve Outcome: Progressing   Problem: Skin Integrity: Goal: Risk for impaired skin integrity will decrease Outcome: Progressing   Problem: Education: Goal: Understanding of disease, treatment, and recovery process will improve Outcome: Progressing   Problem: Activity: Goal: Ability to return to baseline activity level will improve Outcome: Progressing   Problem: Cardiac: Goal: Ability to maintain adequate cardiovascular perfusion will improve Outcome: Progressing Goal: Vascular access site(s) Level 0-1 will be maintained Outcome: Progressing   Problem: Health Behavior/ Discharge Planning: Goal: Ability to safely manage health related needs after discharge Outcome: Progressing

## 2019-01-23 NOTE — Progress Notes (Signed)
Site area: rt groin 3-fv sheaths Site Prior to Removal:  Level 0 Pressure Applied For: 15 minutes Manual:   yes Patient Status During Pull:  stable Post Pull Site:  Level 0 Post Pull Instructions Given:  yes Post Pull Pulses Present: rt dp palpable Dressing Applied:  Gauze and tegaderm Bedrest begins @ 1230 Comments:  IV saline locked

## 2019-01-23 NOTE — Plan of Care (Signed)
  Problem: Health Behavior/Discharge Planning: Goal: Ability to manage health-related needs will improve 01/23/2019 1816 by Don Perking, RN Outcome: Completed/Met 01/23/2019 0848 by Don Perking, RN Outcome: Progressing   Problem: Clinical Measurements: Goal: Ability to maintain clinical measurements within normal limits will improve 01/23/2019 1816 by Don Perking, RN Outcome: Completed/Met 01/23/2019 0848 by Don Perking, RN Outcome: Progressing Goal: Will remain free from infection 01/23/2019 1816 by Don Perking, RN Outcome: Completed/Met 01/23/2019 0848 by Don Perking, RN Outcome: Progressing Goal: Diagnostic test results will improve 01/23/2019 1816 by Don Perking, RN Outcome: Completed/Met 01/23/2019 0848 by Don Perking, RN Outcome: Progressing Goal: Respiratory complications will improve 01/23/2019 1816 by Don Perking, RN Outcome: Completed/Met 01/23/2019 0848 by Don Perking, RN Outcome: Progressing Goal: Cardiovascular complication will be avoided 01/23/2019 1816 by Don Perking, RN Outcome: Completed/Met 01/23/2019 0848 by Don Perking, RN Outcome: Progressing   Problem: Activity: Goal: Risk for activity intolerance will decrease 01/23/2019 1816 by Don Perking, RN Outcome: Completed/Met 01/23/2019 0848 by Don Perking, RN Outcome: Progressing   Problem: Nutrition: Goal: Adequate nutrition will be maintained 01/23/2019 1816 by Don Perking, RN Outcome: Completed/Met 01/23/2019 0848 by Don Perking, RN Outcome: Progressing   Problem: Coping: Goal: Level of anxiety will decrease 01/23/2019 1816 by Don Perking, RN Outcome: Completed/Met 01/23/2019 0848 by Don Perking, RN Outcome: Progressing   Problem: Elimination: Goal: Will not experience complications related to bowel motility 01/23/2019 1816 by Don Perking, RN Outcome: Completed/Met 01/23/2019  0848 by Don Perking, RN Outcome: Progressing Goal: Will not experience complications related to urinary retention 01/23/2019 1816 by Don Perking, RN Outcome: Completed/Met 01/23/2019 0848 by Don Perking, RN Outcome: Progressing   Problem: Pain Managment: Goal: General experience of comfort will improve 01/23/2019 1816 by Don Perking, RN Outcome: Completed/Met 01/23/2019 0848 by Don Perking, RN Outcome: Progressing   Problem: Safety: Goal: Ability to remain free from injury will improve 01/23/2019 1816 by Don Perking, RN Outcome: Completed/Met 01/23/2019 0848 by Don Perking, RN Outcome: Progressing   Problem: Skin Integrity: Goal: Risk for impaired skin integrity will decrease 01/23/2019 1816 by Don Perking, RN Outcome: Completed/Met 01/23/2019 0848 by Don Perking, RN Outcome: Progressing   Problem: Education: Goal: Understanding of disease, treatment, and recovery process will improve 01/23/2019 1816 by Don Perking, RN Outcome: Completed/Met 01/23/2019 0848 by Don Perking, RN Outcome: Progressing   Problem: Activity: Goal: Ability to return to baseline activity level will improve 01/23/2019 1816 by Don Perking, RN Outcome: Completed/Met 01/23/2019 0848 by Don Perking, RN Outcome: Progressing   Problem: Cardiac: Goal: Ability to maintain adequate cardiovascular perfusion will improve 01/23/2019 1816 by Don Perking, RN Outcome: Completed/Met 01/23/2019 0848 by Don Perking, RN Outcome: Progressing Goal: Vascular access site(s) Level 0-1 will be maintained 01/23/2019 1816 by Don Perking, RN Outcome: Completed/Met 01/23/2019 0848 by Don Perking, RN Outcome: Progressing   Problem: Health Behavior/ Discharge Planning: Goal: Ability to safely manage health related needs after discharge 01/23/2019 1816 by Don Perking, RN Outcome: Completed/Met 01/23/2019 0848  by Don Perking, RN Outcome: Progressing

## 2019-01-23 NOTE — Progress Notes (Signed)
Site area: rt IJ venous sheath Site Prior to Removal:  Level 0 Pressure Applied For: 10 minutes Manual:   yes Patient Status During Pull:  stable Post Pull Site:  Level 0 Post Pull Instructions Given:  yes Post Pull Pulses Present: NA Dressing Applied: vasolene gauze,  Gauze and tegaderm Bedrest begins @  Comments:

## 2019-01-23 NOTE — Consult Note (Addendum)
Cardiology Consultation:   Patient ID: Brandon [name]. MRN: [id number]; DOB: 1950-07-08  Admit date: 01/21/2019 Date of Consult: 01/23/2019  Primary Care Provider: Danna Hefty, DO Primary Cardiologist: No primary care provider on file.  Primary Electrophysiologist:  Thompson Grayer, MD    Patient Profile:   Brandon [name]. is a 69 y.o. male with a hx of HTN, HLD, persistent AFib (PVI ablation 2017, Dr. Rayann Heman), SVT, h/o b/l PE and LAA thrombus (on lifelong a/c), untreated OSA, who is being seen today for the evaluation of SVT at the request of Dr. Sallyanne Kuster.  History of Present Illness:   Mr. Groft is followed out patient, has known hx of adenosine sensitive SVT, he was recently seen in the ER 12/27/2018 with his SVT, brought via EMS, he is very symptomatic with his SVT, dizziness, weakness, EMS found him in SVT.  He was given adenosine 6 mg and then 12 mg without relief.  Systolic blood pressure in 70s>> given fluids.  Cardioverted with 100 J however unsuccessful during enroute.  However he had a successful cardioversion in ER at 200 J and he was discharged He returned  With recurrent symptoms/SVT the next day EMS found him again in SVT at 171 bpm.  He was given adenosine 6 mg x 1 then 12 mg x2.  He converted to sinus rhythm after second dose of adenosine 12 mg.  However again went into SVT/A. fib in ER and started on IV amiodarone with bolus with successful conversion. These occurred in the absence of his coreg for a week.  He was discharged back in his coreg and planned for EPS/ablation electively.  He was actually scheduled to come in today for his EPS with Dr. Lovena Le, though Saturday developed symptoms, HR by his Kardia device 150's, by chart notes, EMS arrival, he was in SVT and received '6mg'$  of Adenosine followed by '12mg'$  and an additional '12mg'$  without conversion. Cardioversion attempted by EMS without success but converted later in route.  Given he is so symptomatic was decided to admit  him for observation and proceed with his EPS/ablation as planned today.  LABS K+ 3.6 BUN/Creat 10/0.81 Mag 2.2 HS Trop 25, 52 BNP 115 WBC 7.4 H/H 13/42 Plts 145 TSH 1.640  COVID negative   He feels well this morning, no CP, SOB.  He has no procedure follow up questions, reports Dr. Lovena Le discussed with him last ER visit.  He remains agreeable to proceed.   Heart Pathway Score:     Past Medical History:  Diagnosis Date  . Anxiety   . Arthritis   . Benign neoplasm of colon   . Benign prostatic hyperplasia with urinary obstruction   . Bilateral pulmonary embolism ([place]) 3/14   admitted to [facility],  treated with Xarelto  . Cataract   . Chronic pain   . History of kidney stones   . History of pulmonary embolism 03/19/2012  . Hyperlipemia   . Hypertension   . Insomnia   . Major depressive disorder, recurrent episode ([place])   . Mitral regurgitation 04/24/2013   Mild by TEE  . Obesity   . OSA (obstructive sleep apnea)    noncompliant with CPAP.  07/27/13- awaiting a CPAP- unable to tolerate mask  . Osteoporosis   . Persistent atrial fibrillation ([place])   . Restless leg syndrome    takes gabapentin  . Sciatica   . SVT (supraventricular tachycardia) ([place]) 03/07/2016  . Thrombus of left atrial appendage 03/09/2013  . Ventral  hernia, unspecified, without mention of obstruction or gangrene    right abdominal wall    Past Surgical History:  Procedure Laterality Date  . CARDIOVERSION N/A 03/21/2012   Procedure: CARDIOVERSION;  Surgeon: Birdie Riddle, MD;  Location: [facility] ENDOSCOPY;  Service: Cardiovascular;  Laterality: N/A;  . CARDIOVERSION N/A 04/24/2013   Procedure: CARDIOVERSION;  Surgeon: Dorothy Spark, MD;  Location: [facility] ENDOSCOPY;  Service: Cardiovascular;  Laterality: N/A;  . CARDIOVERSION N/A 06/27/2015   Procedure: CARDIOVERSION;  Surgeon: Larey Dresser, MD;  Location: [place];  Service: Cardiovascular;  Laterality: N/A;  . CATARACT EXTRACTION    . COLONOSCOPY  W/ POLYPECTOMY    . ELECTROPHYSIOLOGIC STUDY N/A 07/30/2015   Procedure: Atrial Fibrillation Ablation;  Surgeon: Thompson Grayer, MD;  Location: [place] CV LAB;  Service: Cardiovascular;  Laterality: N/A;  . EXTRACORPOREAL SHOCK WAVE LITHOTRIPSY Right 10/29/2016   Procedure: RIGHT EXTRACORPOREAL SHOCK WAVE LITHOTRIPSY (ESWL);  Surgeon: Alexis Frock, MD;  Location: [facility] ORS;  Service: Urology;  Laterality: Right;  . EYE SURGERY Right    cataract  . FEMUR FRACTURE SURGERY    . HERNIA REPAIR  07/06/2011  . INGUINAL HERNIA REPAIR Right 07/28/2013   Procedure: RIGHT INGUINAL HERNIA REPAIR;  Surgeon: Imogene Burn. Georgette Dover, MD;  Location: [place];  Service: General;  Laterality: Right;  . INGUINAL HERNIA REPAIR Right 09/22/2016   Procedure: LAPAROSCOPIC RIGHT INGUINAL HERNIA;  Surgeon: Kinsinger, Arta Bruce, MD;  Location: [facility];  Service: General;  Laterality: Right;  With MESH  . INSERTION OF MESH Right 07/28/2013   Procedure: INSERTION OF MESH;  Surgeon: Imogene Burn. Georgette Dover, MD;  Location: [place];  Service: General;  Laterality: Right;  . KNEE ARTHROSCOPY     left  . REPLACEMENT TOTAL KNEE Left   . ROTATOR CUFF REPAIR     right  . ROTATOR CUFF REPAIR Left 03/08/2014   DR SUPPLE  . SHOULDER ARTHROSCOPY WITH ROTATOR CUFF REPAIR AND SUBACROMIAL DECOMPRESSION Left 03/08/2014   Procedure: LEFT SHOULDER ARTHROSCOPY WITH ROTATOR CUFF REPAIR/SUBACROMIAL DECOMPRESSION/DISTAL CLAVICLE RESECTION;  Surgeon: Marin Shutter, MD;  Location: [place];  Service: Orthopedics;  Laterality: Left;  . TEE WITHOUT CARDIOVERSION N/A 03/21/2012   Procedure: TRANSESOPHAGEAL ECHOCARDIOGRAM (TEE);  Surgeon: Birdie Riddle, MD;  Location: [place];  Service: Cardiovascular;  Laterality: N/A;  . TEE WITHOUT CARDIOVERSION N/A 04/24/2013   Procedure: TRANSESOPHAGEAL ECHOCARDIOGRAM (TEE);  Surgeon: Dorothy Spark, MD;  Location: [place];  Service: Cardiovascular;  Laterality: N/A;  . TEE WITHOUT CARDIOVERSION N/A 07/29/2015    Procedure: TRANSESOPHAGEAL ECHOCARDIOGRAM (TEE);  Surgeon: Fay Records, MD;  Location: [facility] ENDOSCOPY;  Service: Cardiovascular;  Laterality: N/A;  . TONSILLECTOMY    . TOTAL KNEE ARTHROPLASTY Right 05/24/2018   Procedure: RIGHT TOTAL KNEE ARTHROPLASTY;  Surgeon: Paralee Cancel, MD;  Location: [facility];  Service: Orthopedics;  Laterality: Right;  70 mins     Home Medications:  Prior to Admission medications   Medication Sig Start Date End Date Taking? Authorizing Provider  amLODipine (NORVASC) 5 MG tablet TAKE 1 TABLET BY MOUTH EVERY DAY Patient taking differently: Take 5 mg by mouth every evening.  01/12/18  Yes [place] Bing, DO  apixaban (ELIQUIS) 5 MG TABS tablet Take 1 tablet (5 mg total) by mouth 2 (two) times daily. 12/21/18  Yes Allred, Jeneen Rinks, MD  atorvastatin (LIPITOR) 40 MG tablet Take 1 tablet (40 mg total) by mouth daily. 10/05/18  Yes Mullis, Kiersten P, DO  diphenhydramine-acetaminophen (TYLENOL PM) 25-500 MG TABS tablet  Take 3 tablets by mouth at bedtime as needed (sleep).   Yes [provider]  escitalopram (LEXAPRO) 20 MG tablet TAKE 1 TABLET BY MOUTH EVERY DAY Patient taking differently: Take 20 mg by mouth daily.  03/29/18  Yes [place] Bing, DO  gabapentin (NEURONTIN) 800 MG tablet Take 1 tablet (800 mg total) by mouth See admin instructions. Takes 1 tablet in the evening and 1 tablet at bedtime. Patient taking differently: Take 800 mg by mouth See admin instructions. Take one tablet (800 mg) by mouth with supper and at bedtime. 09/30/18  Yes Mullis, Kiersten P, DO  losartan (COZAAR) 100 MG tablet TAKE 1 TABLET BY MOUTH EVERY DAY Patient taking differently: Take 100 mg by mouth daily.  12/14/17  Yes Allred, Jeneen Rinks, MD  Psyllium (METAMUCIL PO) Take 3 Scoops by mouth daily as needed (constipation).   Yes [provider]  spironolactone (ALDACTONE) 25 MG tablet Take 1 tablet (25 mg total) by mouth daily. Patient taking differently: Take 25 mg by mouth every  evening.  12/23/18  Yes Allred, Jeneen Rinks, MD  tamsulosin (FLOMAX) 0.4 MG CAPS capsule Take 1 capsule (0.4 mg total) by mouth 2 (two) times daily. 09/23/18  Yes Mullis, Kiersten P, DO  carvedilol (COREG) 25 MG tablet TAKE 1 TABLET (25 MG TOTAL) BY MOUTH 2 (TWO) TIMES DAILY WITH A MEAL. 11/25/17    Bing, DO    Inpatient Medications: Scheduled Meds: . atorvastatin  40 mg Oral q1800  . escitalopram  20 mg Oral Daily  . gabapentin  800 mg Oral BID  . Melatonin  6 mg Oral QHS  . tamsulosin  0.4 mg Oral BID   Continuous Infusions: . sodium chloride 50 mL/hr at 01/22/19 2348   PRN Meds: acetaminophen, ondansetron (ZOFRAN) IV  Allergies:    Allergies  Allergen Reactions  . Ace Inhibitors Cough  . Albuterol Other (See Comments)    Racing heart    Social History:   Social History   Socioeconomic History  . Marital status: Married    Spouse name: Not on file  . Number of children: Not on file  . Years of education: Not on file  . Highest education level: Not on file  Occupational History  . Not on file  Tobacco Use  . Smoking status: Former Smoker    Packs/day: 0.50    Years: 5.00    Pack years: 2.50    Types: Cigarettes    Quit date: 1976    Years since quitting: 45.0  . Smokeless tobacco: Never Used  Substance and Sexual Activity  . Alcohol use: No    Comment: Former EtOH abuse, stopped 06/2016  . Drug use: No    Comment: negative hx for IV drug abuse  . Sexual activity: Not on file  Other Topics Concern  . Not on file  Social History Narrative   Lives with wife in [place]   Retired Dietitian   Wife lives in town (still married but lives separate)   Has biological daughter in [place] ([place])   Social Determinants of Health   Financial Resource Strain:   . Difficulty of Paying Living Expenses: Not on file  Food Insecurity:   . Worried About Charity fundraiser in the Last Year: Not on file  . Ran Out of Food in the Last Year: Not on  file  Transportation Needs:   . Lack of Transportation (Medical): Not on file  . Lack of Transportation (  Non-Medical): Not on file  Physical Activity:   . Days of Exercise per Week: Not on file  . Minutes of Exercise per Session: Not on file  Stress:   . Feeling of Stress : Not on file  Social Connections:   . Frequency of Communication with Friends and Family: Not on file  . Frequency of Social Gatherings with Friends and Family: Not on file  . Attends Religious Services: Not on file  . Active Member of Clubs or Organizations: Not on file  . Attends Archivist Meetings: Not on file  . Marital Status: Not on file  Intimate Partner Violence:   . Fear of Current or Ex-Partner: Not on file  . Emotionally Abused: Not on file  . Physically Abused: Not on file  . Sexually Abused: Not on file    Family History:   Family History  Problem Relation Age of Onset  . Cancer Father        bone  . Heart failure Mother   . Hypertension Mother   . Asthma Mother   . Cancer Paternal Grandmother        ovarian  . CVA Other        Fam Hx of multiple myeloma  . Diabetes Other        Fam Hx of DM     ROS:  Please see the history of present illness.  All other ROS reviewed and negative.     Physical Exam/Data:   Vitals:   01/22/19 2120 01/22/19 2332 01/23/19 0318 01/23/19 0722  BP:  (!) 139/99 (!) 146/104 (!) 144/106  Pulse:  83 84 77  Resp: 10 16    Temp:  98.1 F (36.7 C) 97.6 F (36.4 C) 97.8 F (36.6 C)  TempSrc:  Oral Oral Oral  SpO2:  99%  97%  Weight:      Height:        Intake/Output Summary (Last 24 hours) at 01/23/2019 0729 Last data filed at 01/23/2019 0300 Gross per 24 hour  Intake 1475.05 ml  Output --  Net 1475.05 ml   Last 3 Weights 01/21/2019 01/04/2019 12/27/2018  Weight (lbs) 300 lb 7.8 oz 302 lb 3.2 oz 285 lb  Weight (kg) 136.3 kg 137.077 kg 129.275 kg     Body mass index is 36.58 kg/m.  General:  Well nourished, well developed, in no acute  distress HEENT: normal Lymph: no adenopathy Neck: no JVD Endocrine:  No thryomegaly Vascular: No carotid bruits Cardiac:  RRR; no murmur, gallops or rubs Lungs:  CTA b/l, no wheezing, rhonchi or rales  Abd: soft, nontender Ext: no edema Musculoskeletal:  No deformities Skin: warm and dry  Neuro:  no gross focal abnormalities noted Psych:  Normal affect   EKG:  The EKG was personally reviewed and demonstrates:   SR 71, 1st degree AVblock, PR 276m,  LAD  Telemetry:  Telemetry was personally reviewed and demonstrates:   SR 70's, had a brief, self limited SVT this AM, few seconds  Relevant CV Studies:  Stress test 03/2016  Nuclear stress EF: 53%.  There was no ST segment deviation noted during stress.  Defect 1: There is a large defect of severe severity present in the basal inferolateral, mid inferolateral and apex location.  This is a low risk study.  The left ventricular ejection fraction is mildly decreased (45-54%).  Low risk stress nuclear study with large prior inferolateral infarct and mild peri-infarct ischemia; EF 53 with inferolateral hypokinesis; mild LVE.  Echo 09/2016 Study Conclusions  - Left ventricle: The cavity size was normal. Systolic function was normal. The estimated ejection fraction was in the range of 60% to 65%. Wall motion was normal; there were no regional wall motion abnormalities. Doppler parameters are consistent with abnormal left ventricular relaxation (grade 1 diastolic dysfunction). There was no evidence of elevated ventricular filling pressure by Doppler parameters. - Aortic valve: Trileaflet; normal thickness leaflets. There was no regurgitation. - Mitral valve: There was trivial regurgitation. - Right ventricle: The cavity size was normal. Wall thickness was normal. Systolic function was normal. - Tricuspid valve: There was no regurgitation. - Pulmonary arteries: Systolic pressure could not be  accurately estimated. - Inferior vena cava: The vessel was normal in size. - Pericardium, extracardiac: There was no pericardial effusion.  Laboratory Data:  High Sensitivity Troponin:   Recent Labs  Lab 12/27/18 1034 12/28/18 1147 12/28/18 1500 01/21/19 1338 01/21/19 1534  TROPONINIHS 37* 597* 590* 25* 52*     Chemistry Recent Labs  Lab 01/21/19 1338  NA 140  K 3.6  CL 107  CO2 26  GLUCOSE 94  BUN 10  CREATININE 0.81  CALCIUM 8.3*  GFRNONAA >60  GFRAA >60  ANIONGAP 7    Recent Labs  Lab 01/21/19 1338  PROT 6.0*  ALBUMIN 3.5  AST 16  ALT 15  ALKPHOS 74  BILITOT 0.7   Hematology Recent Labs  Lab 01/21/19 1338  WBC 7.4  RBC 4.55  HGB 13.8  HCT 42.2  MCV 92.7  MCH 30.3  MCHC 32.7  RDW 12.8  PLT 145*   BNP Recent Labs  Lab 01/21/19 1338  BNP 115.2*    DDimer No results for input(s): DDIMER in the last 168 hours.   Radiology/Studies:   DG Chest Portable 1 View Result Date: 01/21/2019 CLINICAL DATA:  Palpitations, SVT, chest pain EXAM: PORTABLE CHEST 1 VIEW COMPARISON:  12/28/2018 FINDINGS: Stable cardiomegaly without focal pneumonia, collapse or consolidation. Negative for edema, large effusion or pneumothorax. Trachea midline. Aorta atherosclerotic. Degenerative changes of the spine and shoulders. IMPRESSION: Stable cardiomegaly without interval change or superimposed acute process. Electronically Signed   By: Jerilynn Mages.  Shick M.D.   On: 01/21/2019 14:05   {   Assessment and Plan:   1. Recurrent, symptomatic SVT     Planned for EPS/ablation today with Dr.Yanelly Cantrelle     Coreg and Eliquis held yesterday for his procedure today     Has been described as adenosine sensitive, of late has required DCCV   2. Persistent AFib     CHA2DS2Vasc is 2, on eliquis, appropriately dosed     H/o PVI ablation 2017  3. HTN     Resume coreg post procedure  4. HS Trop, mildly abnormal     No CP, no ischemic changes on his EKG     Likely demand in setting of his  SVT and DCCV attempt   For questions or updates, please contact [place] Please consult [email or web address] for contact info under   Signed, Baldwin [place], PA-C  01/23/2019 7:29 AM  EP attending  Patient seen and examined.  Agree with the findings as noted above.  The patient has a history of recurrent SVT.  He was admitted to the hospital with incessant SVT refractory to adenosine.  He was cardioverted.  He was observed overnight.  He presents today for EP study and catheter ablation of SVT.  I have discussed the treatment options in detail with the patient.  The risks, goals,  benefits, and expectations of catheter ablation were again reviewed and he wishes to proceed.  [name], MD

## 2019-02-19 ENCOUNTER — Ambulatory Visit: Payer: Medicare Other | Attending: Internal Medicine

## 2019-02-19 DIAGNOSIS — Z23 Encounter for immunization: Secondary | ICD-10-CM | POA: Insufficient documentation

## 2019-02-19 NOTE — Progress Notes (Signed)
   [id number]  Name:  Brandon [name].    MRN: [id number] DOB: 1950-09-11  02/19/2019  Brandon [name] was observed post Covid-19 immunization for 15 minutes without incidence. He was provided with Vaccine Information Sheet and instruction to access the V-Safe system.   Brandon [name] was instructed to call 911 with any severe reactions post vaccine: Marland Kitchen Difficulty breathing  . Swelling of your face and throat  . A fast heartbeat  . A bad rash all over your body  . Dizziness and weakness    Immunizations Administered    Name Date Dose VIS Date Route   Pfizer COVID-19 Vaccine 02/19/2019  8:30 AM 0.3 mL 12/16/2018 Intramuscular   Manufacturer: [place]   Lot: [id number]   [place]: [id number]

## 2019-02-20 ENCOUNTER — Ambulatory Visit (INDEPENDENT_AMBULATORY_CARE_PROVIDER_SITE_OTHER): Payer: Medicare Other | Admitting: Internal Medicine

## 2019-02-20 ENCOUNTER — Encounter: Payer: Self-pay | Admitting: Internal Medicine

## 2019-02-20 ENCOUNTER — Other Ambulatory Visit: Payer: Self-pay

## 2019-02-20 VITALS — BP 138/88 | HR 74 | Ht 76.0 in | Wt 305.0 lb

## 2019-02-20 DIAGNOSIS — I471 Supraventricular tachycardia: Secondary | ICD-10-CM

## 2019-02-20 NOTE — Progress Notes (Signed)
HPI [name] returns today for followup. He is a pleasant 69 yo man with a h/o SVT who would then go into atrial fib. He underwent EP study and catheter ablatio of easily inducible AVNRT. He has not had any heart racing in the interim. He remains on systemic anti-coagulation. No palpitations.  Allergies  Allergen Reactions  . Ace Inhibitors Cough  . Albuterol Other (See Comments)    Racing heart     Current Outpatient Medications  Medication Sig Dispense Refill  . amLODipine (NORVASC) 5 MG tablet TAKE 1 TABLET BY MOUTH EVERY DAY 90 tablet 3  . apixaban (ELIQUIS) 5 MG TABS tablet Take 1 tablet (5 mg total) by mouth 2 (two) times daily. 60 tablet 8  . atorvastatin (LIPITOR) 40 MG tablet Take 1 tablet (40 mg total) by mouth daily. 90 tablet 3  . carvedilol (COREG) 25 MG tablet TAKE 1 TABLET (25 MG TOTAL) BY MOUTH 2 (TWO) TIMES DAILY WITH A MEAL. 180 tablet 3  . diphenhydramine-acetaminophen (TYLENOL PM) 25-500 MG TABS tablet Take 3 tablets by mouth at bedtime as needed (sleep).    Marland Kitchen escitalopram (LEXAPRO) 20 MG tablet TAKE 1 TABLET BY MOUTH EVERY DAY 90 tablet 3  . gabapentin (NEURONTIN) 800 MG tablet Take 1 tablet (800 mg total) by mouth See admin instructions. Takes 1 tablet in the evening and 1 tablet at bedtime. 180 tablet 3  . losartan (COZAAR) 100 MG tablet TAKE 1 TABLET BY MOUTH EVERY DAY 90 tablet 3  . Psyllium (METAMUCIL PO) Take 3 Scoops by mouth daily as needed (constipation).    Marland Kitchen spironolactone (ALDACTONE) 25 MG tablet Take 1 tablet (25 mg total) by mouth daily. 90 tablet 1  . tamsulosin (FLOMAX) 0.4 MG CAPS capsule Take 1 capsule (0.4 mg total) by mouth 2 (two) times daily. 180 capsule 3   No current facility-administered medications for this visit.     Past Medical History:  Diagnosis Date  . Anxiety   . Arthritis   . Benign neoplasm of colon   . Benign prostatic hyperplasia with urinary obstruction   . Bilateral pulmonary embolism ([place]) 3/14   admitted to  [facility],  treated with Xarelto  . Cataract   . Chronic pain   . History of kidney stones   . History of pulmonary embolism 03/19/2012  . Hyperlipemia   . Hypertension   . Insomnia   . Major depressive disorder, recurrent episode ([place])   . Mitral regurgitation 04/24/2013   Mild by TEE  . Obesity   . OSA (obstructive sleep apnea)    noncompliant with CPAP.  07/27/13- awaiting a CPAP- unable to tolerate mask  . Osteoporosis   . Persistent atrial fibrillation ([place])   . Restless leg syndrome    takes gabapentin  . Sciatica   . SVT (supraventricular tachycardia) ([place]) 03/07/2016  . Thrombus of left atrial appendage 03/09/2013  . Ventral hernia, unspecified, without mention of obstruction or gangrene    right abdominal wall    ROS:   All systems reviewed and negative except as noted in the HPI.   Past Surgical History:  Procedure Laterality Date  . CARDIOVERSION N/A 03/21/2012   Procedure: CARDIOVERSION;  Surgeon: Birdie Riddle, MD;  Location: [facility] ENDOSCOPY;  Service: Cardiovascular;  Laterality: N/A;  . CARDIOVERSION N/A 04/24/2013   Procedure: CARDIOVERSION;  Surgeon: Dorothy Spark, MD;  Location: [place];  Service: Cardiovascular;  Laterality: N/A;  . CARDIOVERSION N/A 06/27/2015   Procedure:  CARDIOVERSION;  Surgeon: Larey Dresser, MD;  Location: [place];  Service: Cardiovascular;  Laterality: N/A;  . CATARACT EXTRACTION    . COLONOSCOPY W/ POLYPECTOMY    . ELECTROPHYSIOLOGIC STUDY N/A 07/30/2015   Procedure: Atrial Fibrillation Ablation;  Surgeon: Thompson Grayer, MD;  Location: [place] CV LAB;  Service: Cardiovascular;  Laterality: N/A;  . EXTRACORPOREAL SHOCK WAVE LITHOTRIPSY Right 10/29/2016   Procedure: RIGHT EXTRACORPOREAL SHOCK WAVE LITHOTRIPSY (ESWL);  Surgeon: Alexis Frock, MD;  Location: [facility] ORS;  Service: Urology;  Laterality: Right;  . EYE SURGERY Right    cataract  . FEMUR FRACTURE SURGERY    . HERNIA REPAIR  07/06/2011  . INGUINAL HERNIA REPAIR  Right 07/28/2013   Procedure: RIGHT INGUINAL HERNIA REPAIR;  Surgeon: Imogene Burn. Georgette Dover, MD;  Location: [place];  Service: General;  Laterality: Right;  . INGUINAL HERNIA REPAIR Right 09/22/2016   Procedure: LAPAROSCOPIC RIGHT INGUINAL HERNIA;  Surgeon: Kinsinger, Arta Bruce, MD;  Location: [facility];  Service: General;  Laterality: Right;  With MESH  . INSERTION OF MESH Right 07/28/2013   Procedure: INSERTION OF MESH;  Surgeon: Imogene Burn. Georgette Dover, MD;  Location: [place];  Service: General;  Laterality: Right;  . KNEE ARTHROSCOPY     left  . REPLACEMENT TOTAL KNEE Left   . ROTATOR CUFF REPAIR     right  . ROTATOR CUFF REPAIR Left 03/08/2014   DR SUPPLE  . SHOULDER ARTHROSCOPY WITH ROTATOR CUFF REPAIR AND SUBACROMIAL DECOMPRESSION Left 03/08/2014   Procedure: LEFT SHOULDER ARTHROSCOPY WITH ROTATOR CUFF REPAIR/SUBACROMIAL DECOMPRESSION/DISTAL CLAVICLE RESECTION;  Surgeon: Marin Shutter, MD;  Location: [place];  Service: Orthopedics;  Laterality: Left;  . SVT ABLATION N/A 01/23/2019   Procedure: SVT ABLATION;  Surgeon: Evans Lance, MD;  Location: [place] CV LAB;  Service: Cardiovascular;  Laterality: N/A;  . TEE WITHOUT CARDIOVERSION N/A 03/21/2012   Procedure: TRANSESOPHAGEAL ECHOCARDIOGRAM (TEE);  Surgeon: Birdie Riddle, MD;  Location: [place];  Service: Cardiovascular;  Laterality: N/A;  . TEE WITHOUT CARDIOVERSION N/A 04/24/2013   Procedure: TRANSESOPHAGEAL ECHOCARDIOGRAM (TEE);  Surgeon: Dorothy Spark, MD;  Location: [place];  Service: Cardiovascular;  Laterality: N/A;  . TEE WITHOUT CARDIOVERSION N/A 07/29/2015   Procedure: TRANSESOPHAGEAL ECHOCARDIOGRAM (TEE);  Surgeon: Fay Records, MD;  Location: [facility] ENDOSCOPY;  Service: Cardiovascular;  Laterality: N/A;  . TONSILLECTOMY    . TOTAL KNEE ARTHROPLASTY Right 05/24/2018   Procedure: RIGHT TOTAL KNEE ARTHROPLASTY;  Surgeon: Paralee Cancel, MD;  Location: [facility];  Service: Orthopedics;  Laterality: Right;  70 mins     Family History    Problem Relation Age of Onset  . Cancer Father        bone  . Heart failure Mother   . Hypertension Mother   . Asthma Mother   . Cancer Paternal Grandmother        ovarian  . CVA Other        Fam Hx of multiple myeloma  . Diabetes Other        Fam Hx of DM     Social History   Socioeconomic History  . Marital status: Married    Spouse name: Not on file  . Number of children: Not on file  . Years of education: Not on file  . Highest education level: Not on file  Occupational History  . Not on file  Tobacco Use  . Smoking status: Former Smoker    Packs/day: 0.50    Years: 5.00  Pack years: 2.50    Types: Cigarettes    Quit date: 1976    Years since quitting: 45.1  . Smokeless tobacco: Never Used  Substance and Sexual Activity  . Alcohol use: No    Comment: Former EtOH abuse, stopped 06/2016  . Drug use: No    Comment: negative hx for IV drug abuse  . Sexual activity: Not on file  Other Topics Concern  . Not on file  Social History Narrative   Lives with wife in [place]   Retired Dietitian   Wife lives in town (still married but lives separate)   Has biological daughter in [place] ([place])   Social Determinants of Health   Financial Resource Strain:   . Difficulty of Paying Living Expenses: Not on file  Food Insecurity:   . Worried About Charity fundraiser in the Last Year: Not on file  . Ran Out of Food in the Last Year: Not on file  Transportation Needs:   . Lack of Transportation (Medical): Not on file  . Lack of Transportation (Non-Medical): Not on file  Physical Activity:   . Days of Exercise per Week: Not on file  . Minutes of Exercise per Session: Not on file  Stress:   . Feeling of Stress : Not on file  Social Connections:   . Frequency of Communication with Friends and Family: Not on file  . Frequency of Social Gatherings with Friends and Family: Not on file  . Attends Religious Services: Not on file  . Active Member of  Clubs or Organizations: Not on file  . Attends Archivist Meetings: Not on file  . Marital Status: Not on file  Intimate Partner Violence:   . Fear of Current or Ex-Partner: Not on file  . Emotionally Abused: Not on file  . Physically Abused: Not on file  . Sexually Abused: Not on file     BP 138/88   Pulse 74   Ht 6' 4"  (1.93 m)   SpO2 96%   BMI 36.58 kg/m   Physical Exam:  Well appearing NAD HEENT: Unremarkable Neck:  No JVD, no thyromegally Lymphatics:  No adenopathy Back:  No CVA tenderness Lungs:  Clear with no wheezes HEART:  Regular rate rhythm, no murmurs, no rubs, no clicks Abd:  soft, positive bowel sounds, no organomegally, no rebound, no guarding Ext:  2 plus pulses, no edema, no cyanosis, no clubbing Skin:  No rashes no nodules Neuro:  CN II through XII intact, motor grossly intact  EKG - nsr  Assess/Plan: 1. SVT - he is doing well, after catheter ablation. He will undergo watchful waiting. 2. PAF - it is unclear as to whether his atrial fib occurred only with SVT or indiependent of SVT. For now I would recommend watchful waiting and continuation of his anti-coagulation. 3. HTN - he will continue his ARB and amlodipine.  4. Dyslipidemia - he will continue his lipitor. I have encouraged the patient to increase his physical activity.  Mikle Bosworth.D.

## 2019-02-20 NOTE — Patient Instructions (Addendum)
Medication Instructions:  Your physician recommends that you continue on your current medications as directed. Please refer to the Current Medication list given to you today.  Labwork: None ordered.  Testing/Procedures: None ordered.  Follow-Up: Your physician wants you to follow-up in: as needed with Dr. Taylor.      Any Other Special Instructions Will Be Listed Below (If Applicable).  If you need a refill on your cardiac medications before your next appointment, please call your pharmacy.   

## 2019-02-27 NOTE — Addendum Note (Signed)
Addended by: Rose Phi on: 02/27/2019 09:02 AM   Modules accepted: Orders

## 2019-03-02 ENCOUNTER — Other Ambulatory Visit: Payer: Self-pay | Admitting: Internal Medicine

## 2019-03-14 ENCOUNTER — Ambulatory Visit: Payer: Medicare Other | Attending: Internal Medicine

## 2019-03-14 DIAGNOSIS — Z23 Encounter for immunization: Secondary | ICD-10-CM | POA: Insufficient documentation

## 2019-03-14 NOTE — Progress Notes (Signed)
   [id number]  Name:  Brandon [name].    MRN: [id number] DOB: 1950-08-15  03/14/2019  [name] was observed post Covid-19 immunization for 15 minutes without incident. He was provided with Vaccine Information Sheet and instruction to access the V-Safe system.   [name] was instructed to call 911 with any severe reactions post vaccine: Marland Kitchen Difficulty breathing  . Swelling of face and throat  . A fast heartbeat  . A bad rash all over body  . Dizziness and weakness   Immunizations Administered    Name Date Dose VIS Date Route   Pfizer COVID-19 Vaccine 03/14/2019  9:59 AM 0.3 mL 12/16/2018 Intramuscular   Manufacturer: [place]   Lot: [id number]   [place]: [id number]

## 2019-03-29 ENCOUNTER — Encounter: Payer: Self-pay | Admitting: Family Medicine

## 2019-03-29 ENCOUNTER — Other Ambulatory Visit: Payer: Self-pay

## 2019-03-29 ENCOUNTER — Ambulatory Visit (INDEPENDENT_AMBULATORY_CARE_PROVIDER_SITE_OTHER): Payer: Medicare Other | Admitting: Family Medicine

## 2019-03-29 VITALS — BP 127/60 | HR 74 | Wt 312.2 lb

## 2019-03-29 DIAGNOSIS — F331 Major depressive disorder, recurrent, moderate: Secondary | ICD-10-CM | POA: Diagnosis not present

## 2019-03-29 DIAGNOSIS — M5440 Lumbago with sciatica, unspecified side: Secondary | ICD-10-CM | POA: Diagnosis not present

## 2019-03-29 DIAGNOSIS — N63 Unspecified lump in unspecified breast: Secondary | ICD-10-CM

## 2019-03-29 DIAGNOSIS — G8929 Other chronic pain: Secondary | ICD-10-CM

## 2019-03-29 HISTORY — DX: Unspecified lump in unspecified breast: N63.0

## 2019-03-29 MED ORDER — ESCITALOPRAM OXALATE 20 MG PO TABS: 20.0000 mg | ORAL_TABLET | Freq: Every day | ORAL | 3 refills | Status: DC

## 2019-03-29 MED ORDER — SPIRONOLACTONE 25 MG PO TABS: 25.0000 mg | ORAL_TABLET | Freq: Every day | ORAL | 1 refills | Status: DC

## 2019-03-29 MED ORDER — AMLODIPINE BESYLATE 5 MG PO TABS: 5.0000 mg | ORAL_TABLET | Freq: Every day | ORAL | 3 refills | Status: DC

## 2019-03-29 NOTE — Patient Instructions (Signed)
It was great to see you!  Our plans for today:  - We are referring you to physical therapy for your back pain.  - We are referring you to Sports Medicine for custom orthotics. - Your breast exam is normal today. It is possible your spironolactone is contributing to this. Talk to your cardiologist about possibly switching this to a different medication to see if this helps. If you notice breast enlargement, mass, skin changes, let us know. - We sent your refills to the pharmacy.  Take care and seek immediate care sooner if you develop any concerns.   Dr. Johnsie [facility]

## 2019-03-29 NOTE — Progress Notes (Signed)
    SUBJECTIVE:   CHIEF COMPLAINT / HPI: lump in breast, back pain  Patient has noticed lump in R breast just deep to nipple.  First noticed about 10 weeks ago.  He thinks this may be related to his after ablation 10 weeks ago, although this was inserted near his neck.  He denies nipple discharge, breast swelling or enlargement, fevers, chills, palpitations, skin changes, itching.  He places guitar every day and the top of his guitar places pressure on this area making it tender to touch.  He denies any chest pain.  Has follow-up with cardiology in a few months.  Remains on Eliquis.  Denies any family history of breast, thyroid, pancreatic cancers.  Also endorsing worsened lower back pain that he attributes to a leg length discrepancy after getting his knee replaced in May 2020.  Reports pain makes it difficult to stand or walk, using cane for ambulation.  Has history of sciatica R>L but this has not been bothering him as much recently, only taking Tylenol PM as needed.  He does not have pain with sitting.  Previously was fairly active, enjoying walking and gardening but has not been able to do this since his knee replacements.  Reported compliance with postop PT.  Denies bowel or bladder incontinence, saddle anesthesia.  Heating pad helps his pain.  Bought heel lift on [employer] which helps some.   PERTINENT  PMH / PSH: afib s/p ablation 2017 on Eliquis, HTN, SVT, OSA, asthma, obesity, MDD/GAD, h/o PE and LAA thrombus, HLD  OBJECTIVE:   BP 127/60   Pulse 74   Wt (!) 312 lb 3.2 oz (141.6 kg)   SpO2 92%   BMI 38.00 kg/m   GEN: Elderly, overweight, in NAD Breasts: breasts appear normal, no suspicious masses, no skin or nipple changes or axillary nodes. MSK: Slight paravertebral hypertonicity of the lumbar spine, no midline tenderness.  Flexion with relief of pain.  Intact sidebending.  Lumbar extension with exacerbation of pain.  No overlying skin changes or asymmetry of the lumbar spine. 4/5 quad  strength, 5/5 dorsiflexion/plantar flexion.  Symmetry and length noted at bilateral ankles and knees with heel lift in.  Unable to fully appreciate symmetry of hips due to body habitus.  ASSESSMENT/PLAN:   Chronic bilateral low back pain with sciatica Worsened since knee replacement surgery 05/2018, likely secondary to new leg length discrepancy.  Will refer to sports medicine for custom orthotics and physical therapy for strengthening and pain relief.  Emergency precautions reviewed including bowel or bladder incontinence or saddle anesthesia.  Patient verbalized understanding.  Can continue to take Tylenol or NSAIDs for pain relief.  Breast lump No abnormalities appreciated on today's exam.  Is on spironolactone so may be contributing to gynecomastia with irritation from pressure from guitar.  No changes made today, advised patient to discuss medication with cardiology given recent ablation.  Low suspicion for malignant process given lack of abnormal findings and contributing family history.  No findings to suggest infection.   Rory Percy, [place]

## 2019-03-29 NOTE — Assessment & Plan Note (Addendum)
No abnormalities appreciated on today's exam.  Is on spironolactone so may be contributing to gynecomastia with irritation from pressure from guitar.  No changes made today, advised patient to discuss medication with cardiology given recent ablation.  Low suspicion for malignant process given lack of abnormal findings and contributing family history.  No findings to suggest infection.

## 2019-03-29 NOTE — Assessment & Plan Note (Signed)
Worsened since knee replacement surgery 05/2018, likely secondary to new leg length discrepancy.  Will refer to sports medicine for custom orthotics and physical therapy for strengthening and pain relief.  Emergency precautions reviewed including bowel or bladder incontinence or saddle anesthesia.  Patient verbalized understanding.  Can continue to take Tylenol or NSAIDs for pain relief.

## 2019-04-05 ENCOUNTER — Encounter: Payer: Self-pay | Admitting: Sports Medicine

## 2019-04-05 ENCOUNTER — Other Ambulatory Visit: Payer: Self-pay

## 2019-04-05 ENCOUNTER — Ambulatory Visit (INDEPENDENT_AMBULATORY_CARE_PROVIDER_SITE_OTHER): Payer: Medicare Other | Admitting: Sports Medicine

## 2019-04-05 VITALS — BP 115/78 | Ht 75.5 in | Wt 300.0 lb

## 2019-04-05 DIAGNOSIS — G5702 Lesion of sciatic nerve, left lower limb: Secondary | ICD-10-CM | POA: Diagnosis not present

## 2019-04-05 DIAGNOSIS — M2170 Unequal limb length (acquired), unspecified site: Secondary | ICD-10-CM

## 2019-04-05 NOTE — Patient Instructions (Signed)
Please try this heel lift in your left shoe for the next few weeks Please show your physical therapist that you have some piriformis syndrome on the left side so he can help treat that appropriately If you are having worse pain with a heel lift in, you can always take it out Use Tylenol as needed for pain Recommend purchasing a foam roller for your piriformis syndrome Follow-up with me in about 4-6 weeks
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
## 2019-04-17 ENCOUNTER — Other Ambulatory Visit: Payer: Self-pay

## 2019-04-17 ENCOUNTER — Encounter: Payer: Self-pay | Admitting: Physical Therapy

## 2019-04-17 ENCOUNTER — Ambulatory Visit: Payer: Medicare Other | Attending: Family Medicine | Admitting: Physical Therapy

## 2019-04-17 DIAGNOSIS — G8929 Other chronic pain: Secondary | ICD-10-CM | POA: Diagnosis not present

## 2019-04-17 DIAGNOSIS — M545 Low back pain, unspecified: Secondary | ICD-10-CM

## 2019-04-17 DIAGNOSIS — M25552 Pain in left hip: Secondary | ICD-10-CM

## 2019-04-17 DIAGNOSIS — R2689 Other abnormalities of gait and mobility: Secondary | ICD-10-CM

## 2019-04-17 NOTE — Therapy (Signed)
[place], [place], [address] Phone: [phone]   Fax:  [phone]  Physical Therapy Evaluation  Patient Details  Name: Brandon [name]. MRN: [id number] Date of Birth: 1950-04-20 Referring Provider (PT): Dr Dorris Singh    Encounter Date: 04/17/2019  PT End of Session - 04/17/19 0946    Visit Number  1    Number of Visits  12    Authorization Type Auth date 05/29/2019   Mcr /BCBS    PT Start Time  0930    PT Stop Time  1012    PT Time Calculation (min)  42 min    Activity Tolerance  Patient tolerated treatment well    Behavior During Therapy  [facility] for tasks assessed/performed       Past Medical History:  Diagnosis Date  . Anxiety   . Arthritis   . Benign neoplasm of colon   . Benign prostatic hyperplasia with urinary obstruction   . Bilateral pulmonary embolism ([place]) 3/14   admitted to [facility],  treated with Xarelto  . Cataract   . Chronic pain   . History of kidney stones   . History of pulmonary embolism 03/19/2012  . Hyperlipemia   . Hypertension   . Insomnia   . Major depressive disorder, recurrent episode ([place])   . Mitral regurgitation 04/24/2013   Mild by TEE  . Obesity   . OSA (obstructive sleep apnea)    noncompliant with CPAP.  07/27/13- awaiting a CPAP- unable to tolerate mask  . Osteoporosis   . Persistent atrial fibrillation ([place])   . Restless leg syndrome    takes gabapentin  . Sciatica   . SVT (supraventricular tachycardia) ([place]) 03/07/2016  . Thrombus of left atrial appendage 03/09/2013  . Ventral hernia, unspecified, without mention of obstruction or gangrene    right abdominal wall    Past Surgical History:  Procedure Laterality Date  . CARDIOVERSION N/A 03/21/2012   Procedure: CARDIOVERSION;  Surgeon: Birdie Riddle, MD;  Location: [facility] ENDOSCOPY;  Service: Cardiovascular;  Laterality: N/A;  . CARDIOVERSION N/A 04/24/2013   Procedure: CARDIOVERSION;  Surgeon: Dorothy Spark, MD;  Location: [facility] ENDOSCOPY;  Service: Cardiovascular;  Laterality: N/A;  . CARDIOVERSION N/A 06/27/2015   Procedure: CARDIOVERSION;  Surgeon: Larey Dresser, MD;  Location: [place];  Service: Cardiovascular;  Laterality: N/A;  . CATARACT EXTRACTION    . COLONOSCOPY W/ POLYPECTOMY    . ELECTROPHYSIOLOGIC STUDY N/A 07/30/2015   Procedure: Atrial Fibrillation Ablation;  Surgeon: Thompson Grayer, MD;  Location: [place] CV LAB;  Service: Cardiovascular;  Laterality: N/A;  . EXTRACORPOREAL SHOCK WAVE LITHOTRIPSY Right 10/29/2016   Procedure: RIGHT EXTRACORPOREAL SHOCK WAVE LITHOTRIPSY (ESWL);  Surgeon: Alexis Frock, MD;  [facility];  Service: Urology;  Laterality: Right;  . EYE SURGERY Right    cataract  . FEMUR FRACTURE SURGERY    . HERNIA REPAIR  07/06/2011  . INGUINAL HERNIA REPAIR Right 07/28/2013   Procedure: RIGHT INGUINAL HERNIA REPAIR;  Surgeon: Imogene Burn. Georgette Dover, MD;  Location: [place];  Service: General;  Laterality: Right;  . INGUINAL HERNIA REPAIR Right 09/22/2016   Procedure: LAPAROSCOPIC RIGHT INGUINAL HERNIA;  Surgeon: Kinsinger, Arta Bruce, MD;  [facility];  Service: General;  Laterality: Right;  With MESH  . INSERTION OF MESH Right 07/28/2013   Procedure: INSERTION OF MESH;  Surgeon: Imogene Burn. Georgette Dover, MD;  Location: [place];  Service: General;  Laterality: Right;  . KNEE ARTHROSCOPY  left  . REPLACEMENT TOTAL KNEE Left   . ROTATOR CUFF REPAIR     right  . ROTATOR CUFF REPAIR Left 03/08/2014   DR SUPPLE  . SHOULDER ARTHROSCOPY WITH ROTATOR CUFF REPAIR AND SUBACROMIAL DECOMPRESSION Left 03/08/2014   Procedure: LEFT SHOULDER ARTHROSCOPY WITH ROTATOR CUFF REPAIR/SUBACROMIAL DECOMPRESSION/DISTAL CLAVICLE RESECTION;  Surgeon: Marin Shutter, MD;  Location: [place];  Service: Orthopedics;  Laterality: Left;  . SVT ABLATION N/A 01/23/2019   Procedure: SVT ABLATION;  Surgeon: Evans Lance, MD;  Location: [place] CV LAB;  Service: Cardiovascular;  Laterality:  N/A;  . TEE WITHOUT CARDIOVERSION N/A 03/21/2012   Procedure: TRANSESOPHAGEAL ECHOCARDIOGRAM (TEE);  Surgeon: Birdie Riddle, MD;  Location: [place];  Service: Cardiovascular;  Laterality: N/A;  . TEE WITHOUT CARDIOVERSION N/A 04/24/2013   Procedure: TRANSESOPHAGEAL ECHOCARDIOGRAM (TEE);  Surgeon: Dorothy Spark, MD;  Location: [place];  Service: Cardiovascular;  Laterality: N/A;  . TEE WITHOUT CARDIOVERSION N/A 07/29/2015   Procedure: TRANSESOPHAGEAL ECHOCARDIOGRAM (TEE);  Surgeon: Fay Records, MD;  Location: [facility] ENDOSCOPY;  Service: Cardiovascular;  Laterality: N/A;  . TONSILLECTOMY    . TOTAL KNEE ARTHROPLASTY Right 05/24/2018   Procedure: RIGHT TOTAL KNEE ARTHROPLASTY;  Surgeon: Paralee Cancel, MD;  [facility];  Service: Orthopedics;  Laterality: Right;  70 mins    There were no vitals filed for this visit.   Subjective Assessment - 04/17/19 0942    Subjective  Patient had a right  knee replacmenet in April of 2020. He went to therapy but the leg never achieved full extension.    Pertinent History  A-fib, anxiety, SVT, pulmonary embolisms    Limitations  Standing;Sitting    How long can you sit comfortably?  no limit    Diagnostic tests  Notjoing on his back or hips for a while    Currently in Pain?  Yes    Pain Score  9    only when walking   Pain Location  Back    Pain Orientation  Right    Pain Descriptors / Indicators  Aching    Pain Type  Chronic pain    Pain Onset  More than a month ago    Pain Frequency  Intermittent    Aggravating Factors   standing and walking    Pain Relieving Factors  sitting    Effect of Pain on Daily Activities  difficulty standing and walking         [facility] PT Assessment - 04/17/19 0001      Assessment   Medical Diagnosis  Low back and Left Hip Pain     Referring Provider (PT)  Dr Dorris Singh     Onset Date/Surgical Date  --   long history but pain getting worse    Hand Dominance  Right    Next MD Visit  Nothing schedueld      Prior Therapy  For his knee       Precautions   Precautions  None      Restrictions   Weight Bearing Restrictions  No      Balance Screen   Has the patient fallen in the past 6 months  No    Has the patient had a decrease in activity level because of a fear of falling?   No    Is the patient reluctant to leave their home because of a fear of falling?   No      Home Environment   Additional Comments  Stairs  into the house. 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   Overall Cognitive Status  Within Functional Limits for tasks assessed    Attention  Focused    Focused Attention  Appears intact    Memory  Appears intact    Awareness  Appears intact    Problem Solving  Appears intact      Observation/Other Assessments   Focus on Therapeutic Outcomes (FOTO)   75% limitation  54% expected       Sensation   Light Touch  Appears Intact    Additional Comments  can radiate down left LE       Coordination   Gross Motor Movements are Fluid and Coordinated  Yes    Fine Motor Movements are Fluid and Coordinated  Yes      ROM / Strength   AROM / PROM / Strength  AROM;PROM;Strength      AROM   AROM Assessment Site  Hip      PROM   Overall PROM Comments  minor end range restriction of the left hip     PROM Assessment Site  Hip    Right/Left Hip  Right;Left      Strength   Strength Assessment Site  Hip;Knee    Right/Left Hip  Right;Left    Right Hip Flexion  4/5    Right Hip ABduction  4/5    Right Hip ADduction  4/5    Left Hip Flexion  4/5    Left Hip ABduction  4/5    Left Hip ADduction  4/5      Palpation   Palpation comment  tnedenress to palpation in the left gluteal and bilateral lumbar spine       Special Tests   Other special tests  apparent leg length in supine equal       Ambulation/Gait   Gait Comments  uses  single point cane for ambulation; decreased single leg stance on the left; lateral movement  bilateral. Increased lateral movement with heel lift.                 Objective measurements completed on examination: See above findings.      [place] Adult PT Treatment/Exercise - 04/17/19 0001      Lumbar Exercises: Stretches   Passive Hamstring Stretch Limitations  seated hmastring stretch 3x20 sec hold     Piriformis Stretch Limitations  3x20 sec hold       Lumbar Exercises: Seated   Other Seated Lumbar Exercises  seated clamshells yellow 2x10             PT Education - 04/17/19 0958    Education Details  reviewed HEP, symptom mangement    Person(s) Educated  Patient    Methods  Explanation;Demonstration;Tactile cues;Verbal cues    Comprehension  Verbalized understanding;Returned demonstration;Verbal cues required;Tactile cues required       PT Short Term Goals - 04/17/19 1634      PT SHORT TERM GOAL #1   Title  Patient will stand for > 7 min withou tincreased back and hip pain    Time  3    Period  Weeks    Status  New    Target Date  05/08/19      PT  SHORT TERM GOAL #2   Title  Patient will ambualte 300' without singlepoint cane without increased pain    Time  3    Period  Weeks    Status  New    Target Date  05/08/19      PT SHORT TERM GOAL #3   Title  Patient will increase bilateral LE strength to 4+/5    Time  3    Period  Weeks    Status  New    Target Date  05/08/19        PT Long Term Goals - 04/17/19 1635      PT LONG TERM GOAL #1   Title  Patient will stand for 30 minutes without increased pain in order to perfrom ADL's    Time  6    Period  Weeks    Status  New    Target Date  05/29/19      PT LONG TERM GOAL #2   Title  Patient will ambualte 1000' with LRAD without increased pain    Time  6    Period  Weeks    Status  New    Target Date  05/29/19      PT LONG TERM GOAL #3   Title  Patient will demonstrate a 54% limitation on  foto to show improved function    Time  6    Period  Weeks    Status  New    Target Date  05/29/19             Plan - 04/17/19 1029    Clinical Impression Statement  Patient is a 69 year old male with low back pain and left hip pain that increases when he walks. He has a history of a right TKA. He has competed therapy and is still lacking about 5 degrrees of extension. He has an old left hip fx and fixation from the 70's He had a leg length descrepency that he reports was resovled when his left knee was replaced several years ago. He reports now he has a leg length descrepency again. He has a heel lift in his right shoe. With examination he did not appear to have an apparent leg length descrepency. He is using a 2 inch lift ontop of the left given to hip by Dr Kathrynn Speed. He had significant lateral movement with gait but less when the 2 inch lift was removed. His lateral movement is likley coming more from hip weakness then his leg length. He was given basic stretches to use when his back flairs up to prolong his ability to ambualte. His goal is to be able to walk for exercise.    Personal Factors and Comorbidities  Comorbidity 1;Comorbidity 2;Comorbidity 3+    Comorbidities  SVT; right knee replacement; old hip fixation    Examination-Activity Limitations  Carry;Stand;Stairs;Squat;Transfers;Locomotion Level    Examination-Participation Restrictions  Community Activity;Shop    Stability/Clinical Decision Making  Evolving/Moderate complexity   decreasing ability to ambualtate   Clinical Decision Making  Moderate    Rehab Potential  Good    PT Frequency  2x / week    PT Duration  6 weeks    PT Treatment/Interventions  ADLs/Self Care Home Management;Cryotherapy;Electrical Stimulation;Iontophoresis 4mg /ml Dexamethasone;Moist Heat;Ultrasound;DME Instruction;Neuromuscular re-education;Therapeutic exercise;Therapeutic activities;Patient/family education;Manual techniques;Passive range of motion;Dry  needling;Taping    PT Next Visit Plan  begin core and hip strengthening. Patient given seated hip abduction already; consider supine march; supine bridge, consider LAQ; progress  PT Home Exercise Plan  hamstring and piriformins stretch; supine hip abduction    Consulted and Agree with Plan of Care  Patient       Patient will benefit from skilled therapeutic intervention in order to improve the following deficits and impairments:  Abnormal gait, Decreased activity tolerance, Pain, Decreased strength, Decreased range of motion, Increased muscle spasms, Decreased endurance  Visit Diagnosis: Chronic bilateral low back pain without sciatica  Pain in left hip  Other abnormalities of gait and mobility     Problem List Patient Active Problem List   Diagnosis Date Noted  . Breast lump 03/29/2019  . SVT (supraventricular tachycardia) ([place]) 01/21/2019  . S/P right total knee arthroplasty 05/24/2018  . Insomnia due to medical condition 12/23/2016  . BPH (benign prostatic hyperplasia) 10/21/2016  . Essential hypertension 10/21/2016  . MDD (major depressive disorder) 10/21/2016  . Obesity (BMI 30-39.9) 03/07/2016  . History of alcohol abuse 03/07/2016  . History of colon polyps 11/18/2015  . Paroxysmal atrial fibrillation (H[facility])   . Restless legs syndrome 02/08/2014  . Generalized anxiety disorder 12/12/2013  . Recurrent unilateral inguinal hernia 07/17/2013  . Nonrheumatic mitral valve insufficiency 04/25/2013  . Current use of long term anticoagulation   . Obstructive sleep apnea 03/09/2013  . Chronic bilateral low back pain with sciatica 03/21/2012  . HLD (hyperlipidemia) 03/21/2012  . Arthralgia of hip 03/21/2012  . History of pulmonary embolism 03/19/2012  . Bilateral inguinal hernia 05/29/2011  . Uncomplicated asthma [id number]  . Anxiety state 07/18/2007    Carney Living 04/17/2019, 4:45 PM  [facility] [address] [place], [place], [address] Phone: [phone]   Fax:  [phone]  Name: Claro Finken. MRN: [id number] Date of Birth: 10/19/1950

## 2019-04-25 ENCOUNTER — Ambulatory Visit: Payer: Medicare Other

## 2019-05-02 ENCOUNTER — Other Ambulatory Visit: Payer: Self-pay

## 2019-05-02 ENCOUNTER — Ambulatory Visit: Payer: Medicare Other

## 2019-05-02 DIAGNOSIS — M25552 Pain in left hip: Secondary | ICD-10-CM

## 2019-05-02 DIAGNOSIS — R2689 Other abnormalities of gait and mobility: Secondary | ICD-10-CM | POA: Diagnosis not present

## 2019-05-02 DIAGNOSIS — G8929 Other chronic pain: Secondary | ICD-10-CM

## 2019-05-02 DIAGNOSIS — M545 Low back pain, unspecified: Secondary | ICD-10-CM

## 2019-05-02 NOTE — Patient Instructions (Signed)
PPT ,  LTR, PPT with arm raises,   PPT with knee lifts    10 reps 1=-2x/day hold 1-5 sec

## 2019-05-02 NOTE — Therapy (Signed)
[place], [place], [address] Phone: [phone]   Fax:  [phone]  Physical Therapy Treatment  Patient Details  Name: Brandon [name]. MRN: [id number] Date of Birth: 29-Sep-1950 Referring Provider (PT): Dr Dorris Singh    Encounter Date: 05/02/2019  PT End of Session - 05/02/19 0914    Visit Number  2    Number of Visits  12    Date for PT Re-Evaluation  05/29/19    Authorization Type  Mcr /BCBS    PT Start Time  0915    PT Stop Time  1000    PT Time Calculation (min)  45 min    Activity Tolerance  Patient tolerated treatment well    Behavior During Therapy  [facility] for tasks assessed/performed       Past Medical History:  Diagnosis Date  . Anxiety   . Arthritis   . Benign neoplasm of colon   . Benign prostatic hyperplasia with urinary obstruction   . Bilateral pulmonary embolism ([place]) 3/14   admitted to [facility],  treated with Xarelto  . Cataract   . Chronic pain   . History of kidney stones   . History of pulmonary embolism 03/19/2012  . Hyperlipemia   . Hypertension   . Insomnia   . Major depressive disorder, recurrent episode ([place])   . Mitral regurgitation 04/24/2013   Mild by TEE  . Obesity   . OSA (obstructive sleep apnea)    noncompliant with CPAP.  07/27/13- awaiting a CPAP- unable to tolerate mask  . Osteoporosis   . Persistent atrial fibrillation ([place])   . Restless leg syndrome    takes gabapentin  . Sciatica   . SVT (supraventricular tachycardia) ([place]) 03/07/2016  . Thrombus of left atrial appendage 03/09/2013  . Ventral hernia, unspecified, without mention of obstruction or gangrene    right abdominal wall    Past Surgical History:  Procedure Laterality Date  . CARDIOVERSION N/A 03/21/2012   Procedure: CARDIOVERSION;  Surgeon: Birdie Riddle, MD;  Location: [facility] ENDOSCOPY;  Service: Cardiovascular;  Laterality: N/A;  . CARDIOVERSION N/A 04/24/2013   Procedure: CARDIOVERSION;   Surgeon: Dorothy Spark, MD;  Location: [facility] ENDOSCOPY;  Service: Cardiovascular;  Laterality: N/A;  . CARDIOVERSION N/A 06/27/2015   Procedure: CARDIOVERSION;  Surgeon: Larey Dresser, MD;  Location: [place];  Service: Cardiovascular;  Laterality: N/A;  . CATARACT EXTRACTION    . COLONOSCOPY W/ POLYPECTOMY    . ELECTROPHYSIOLOGIC STUDY N/A 07/30/2015   Procedure: Atrial Fibrillation Ablation;  Surgeon: Thompson Grayer, MD;  Location: [place] CV LAB;  Service: Cardiovascular;  Laterality: N/A;  . EXTRACORPOREAL SHOCK WAVE LITHOTRIPSY Right 10/29/2016   Procedure: RIGHT EXTRACORPOREAL SHOCK WAVE LITHOTRIPSY (ESWL);  Surgeon: Alexis Frock, MD;  [facility];  Service: Urology;  Laterality: Right;  . EYE SURGERY Right    cataract  . FEMUR FRACTURE SURGERY    . HERNIA REPAIR  07/06/2011  . INGUINAL HERNIA REPAIR Right 07/28/2013   Procedure: RIGHT INGUINAL HERNIA REPAIR;  Surgeon: Imogene Burn. Georgette Dover, MD;  Location: [place];  Service: General;  Laterality: Right;  . INGUINAL HERNIA REPAIR Right 09/22/2016   Procedure: LAPAROSCOPIC RIGHT INGUINAL HERNIA;  Surgeon: Kinsinger, Arta Bruce, MD;  [facility];  Service: General;  Laterality: Right;  With MESH  . INSERTION OF MESH Right 07/28/2013   Procedure: INSERTION OF MESH;  Surgeon: Imogene Burn. Georgette Dover, MD;  Location: [place];  Service: General;  Laterality:  Right;  Marland Kitchen KNEE ARTHROSCOPY     left  . REPLACEMENT TOTAL KNEE Left   . ROTATOR CUFF REPAIR     right  . ROTATOR CUFF REPAIR Left 03/08/2014   DR SUPPLE  . SHOULDER ARTHROSCOPY WITH ROTATOR CUFF REPAIR AND SUBACROMIAL DECOMPRESSION Left 03/08/2014   Procedure: LEFT SHOULDER ARTHROSCOPY WITH ROTATOR CUFF REPAIR/SUBACROMIAL DECOMPRESSION/DISTAL CLAVICLE RESECTION;  Surgeon: Marin Shutter, MD;  Location: [place];  Service: Orthopedics;  Laterality: Left;  . SVT ABLATION N/A 01/23/2019   Procedure: SVT ABLATION;  Surgeon: Evans Lance, MD;  Location: [place] CV LAB;  Service:  Cardiovascular;  Laterality: N/A;  . TEE WITHOUT CARDIOVERSION N/A 03/21/2012   Procedure: TRANSESOPHAGEAL ECHOCARDIOGRAM (TEE);  Surgeon: Birdie Riddle, MD;  Location: [place];  Service: Cardiovascular;  Laterality: N/A;  . TEE WITHOUT CARDIOVERSION N/A 04/24/2013   Procedure: TRANSESOPHAGEAL ECHOCARDIOGRAM (TEE);  Surgeon: Dorothy Spark, MD;  Location: [place];  Service: Cardiovascular;  Laterality: N/A;  . TEE WITHOUT CARDIOVERSION N/A 07/29/2015   Procedure: TRANSESOPHAGEAL ECHOCARDIOGRAM (TEE);  Surgeon: Fay Records, MD;  Location: [facility] ENDOSCOPY;  Service: Cardiovascular;  Laterality: N/A;  . TONSILLECTOMY    . TOTAL KNEE ARTHROPLASTY Right 05/24/2018   Procedure: RIGHT TOTAL KNEE ARTHROPLASTY;  Surgeon: Paralee Cancel, MD;  [facility];  Service: Orthopedics;  Laterality: Right;  70 mins    There were no vitals filed for this visit.  Subjective Assessment - 05/02/19 0919    Subjective  No change.   Did  exercises but not daily due to renovating a house.   Pain starts with walking.  Took steroid pack afyter last session and it helped.  (advised him to contact MD with questionns)    Pertinent History  A-fib, anxiety, SVT, pulmonary embolisms    Currently in Pain?  No/denies                       [facility] Adult PT Treatment/Exercise - 05/02/19 0001      Lumbar Exercises: Stretches   Passive Hamstring Stretch Limitations  seated hmastring stretch 3x20 sec hold     Piriformis Stretch Limitations  3x20 sec hold       Lumbar Exercises: Aerobic   Nustep  L6  LE 5 min      Lumbar Exercises: Supine   Pelvic Tilt  10 reps;10 seconds      PPT with arm raises , PPT with knee raises, PPT with LTR       PT Education - 05/02/19 0956    Education Details  HEP,    need for flexion exercises due to extension increasing pain.  used spine to instruct in pressures to spine with and without DDD    Person(s) Educated  Patient    Methods   Explanation;Demonstration;Handout;Verbal cues;Tactile cues    Comprehension  Verbalized understanding;Returned demonstration       PT Short Term Goals - 04/17/19 1634      PT SHORT TERM GOAL #1   Title  Patient will stand for > 7 min withou tincreased back and hip pain    Time  3    Period  Weeks    Status  New    Target Date  05/08/19      PT SHORT TERM GOAL #2   Title  Patient will ambualte 300' without singlepoint cane without increased pain    Time  3    Period  Weeks    Status  New  Target Date  05/08/19      PT SHORT TERM GOAL #3   Title  Patient will increase bilateral LE strength to 4+/5    Time  3    Period  Weeks    Status  New    Target Date  05/08/19        PT Long Term Goals - 04/17/19 1635      PT LONG TERM GOAL #1   Title  Patient will stand for 30 minutes without increased pain in order to perfrom ADL's    Time  6    Period  Weeks    Status  New    Target Date  05/29/19      PT LONG TERM GOAL #2   Title  Patient will ambualte 1000' with LRAD without increased pain    Time  6    Period  Weeks    Status  New    Target Date  05/29/19      PT LONG TERM GOAL #3   Title  Patient will demonstrate a 54% limitation on foto to show improved function    Time  6    Period  Weeks    Status  New    Target Date  05/29/19            Plan - 05/02/19 0915    Clinical Impression Statement  No increased pain post and he agreed to HEP. He was advised to assess pain post AM activity before he does yard work this PM to see if either increased back pain    PT Treatment/Interventions  ADLs/Self Care Home Management;Cryotherapy;Electrical Stimulation;Iontophoresis 4mg /ml Dexamethasone;Moist Heat;Ultrasound;DME Instruction;Neuromuscular re-education;Therapeutic exercise;Therapeutic activities;Patient/family education;Manual techniques;Passive range of motion;Dry needling;Taping    PT Next Visit Plan  begin core and hip strengthening. Patient given seated hip  abduction already; consider supine march; supine bridge, consider LAQ; progress    PT Home Exercise Plan  hamstring and piriformins stretch; supine hip abduction, PPT, LTR, PPT with arm and with knee raises    Consulted and Agree with Plan of Care  Patient       Patient will benefit from skilled therapeutic intervention in order to improve the following deficits and impairments:  Abnormal gait, Decreased activity tolerance, Pain, Decreased strength, Decreased range of motion, Increased muscle spasms, Decreased endurance  Visit Diagnosis: Chronic bilateral low back pain without sciatica  Pain in left hip  Other abnormalities of gait and mobility     Problem List Patient Active Problem List   Diagnosis Date Noted  . Breast lump 03/29/2019  . SVT (supraventricular tachycardia) ([place]) 01/21/2019  . S/P right total knee arthroplasty 05/24/2018  . Insomnia due to medical condition 12/23/2016  . BPH (benign prostatic hyperplasia) 10/21/2016  . Essential hypertension 10/21/2016  . MDD (major depressive disorder) 10/21/2016  . Obesity (BMI 30-39.9) 03/07/2016  . History of alcohol abuse 03/07/2016  . History of colon polyps 11/18/2015  . Paroxysmal atrial fibrillation (HCC)   . Restless legs syndrome 02/08/2014  . Generalized anxiety disorder 12/12/2013  . Recurrent unilateral inguinal hernia 07/17/2013  . Nonrheumatic mitral valve insufficiency 04/25/2013  . Current use of long term anticoagulation   . Obstructive sleep apnea 03/09/2013  . Chronic bilateral low back pain with sciatica 03/21/2012  . HLD (hyperlipidemia) 03/21/2012  . Arthralgia of hip 03/21/2012  . History of pulmonary embolism 03/19/2012  . Bilateral inguinal hernia 05/29/2011  . Uncomplicated asthma [id number]  . Anxiety state 07/18/2007    Noralee Stain  M  PT 05/02/2019, 10:00 AM  [facility] [address] [place], [place], [address] Phone: [phone]    Fax:  [phone]  Name: Brandon [name]. MRN: [id number] Date of Birth: 01/07/1950

## 2019-05-04 ENCOUNTER — Encounter: Payer: Self-pay | Admitting: Physical Therapy

## 2019-05-04 ENCOUNTER — Ambulatory Visit: Payer: Medicare Other | Admitting: Physical Therapy

## 2019-05-04 ENCOUNTER — Other Ambulatory Visit: Payer: Self-pay

## 2019-05-04 DIAGNOSIS — R2689 Other abnormalities of gait and mobility: Secondary | ICD-10-CM

## 2019-05-04 DIAGNOSIS — G8929 Other chronic pain: Secondary | ICD-10-CM | POA: Diagnosis not present

## 2019-05-04 DIAGNOSIS — M545 Low back pain, unspecified: Secondary | ICD-10-CM

## 2019-05-04 DIAGNOSIS — M25552 Pain in left hip: Secondary | ICD-10-CM

## 2019-05-04 NOTE — Therapy (Signed)
[place], [place], [address] Phone: [phone]   Fax:  [phone]  Physical Therapy Treatment  Patient Details  Name: Brandon [name]. MRN: [id number] Date of Birth: 1950/08/12 Referring Provider (PT): Dr Dorris Singh    Encounter Date: 05/04/2019  PT End of Session - 05/04/19 0935    Visit Number  3    Number of Visits  12    Date for PT Re-Evaluation  05/29/19    Authorization Type  Mcr /BCBS    PT Start Time  0846    PT Stop Time  0928    PT Time Calculation (min)  42 min    Activity Tolerance  Patient tolerated treatment well    Behavior During Therapy  [facility] for tasks assessed/performed       Past Medical History:  Diagnosis Date  . Anxiety   . Arthritis   . Benign neoplasm of colon   . Benign prostatic hyperplasia with urinary obstruction   . Bilateral pulmonary embolism ([place]) 3/14   admitted to [facility],  treated with Xarelto  . Cataract   . Chronic pain   . History of kidney stones   . History of pulmonary embolism 03/19/2012  . Hyperlipemia   . Hypertension   . Insomnia   . Major depressive disorder, recurrent episode ([place])   . Mitral regurgitation 04/24/2013   Mild by TEE  . Obesity   . OSA (obstructive sleep apnea)    noncompliant with CPAP.  07/27/13- awaiting a CPAP- unable to tolerate mask  . Osteoporosis   . Persistent atrial fibrillation ([place])   . Restless leg syndrome    takes gabapentin  . Sciatica   . SVT (supraventricular tachycardia) ([place]) 03/07/2016  . Thrombus of left atrial appendage 03/09/2013  . Ventral hernia, unspecified, without mention of obstruction or gangrene    right abdominal wall    Past Surgical History:  Procedure Laterality Date  . CARDIOVERSION N/A 03/21/2012   Procedure: CARDIOVERSION;  Surgeon: Birdie Riddle, MD;  Location: [facility] ENDOSCOPY;  Service: Cardiovascular;  Laterality: N/A;  . CARDIOVERSION N/A 04/24/2013   Procedure: CARDIOVERSION;   Surgeon: Dorothy Spark, MD;  Location: [facility] ENDOSCOPY;  Service: Cardiovascular;  Laterality: N/A;  . CARDIOVERSION N/A 06/27/2015   Procedure: CARDIOVERSION;  Surgeon: Larey Dresser, MD;  Location: [place];  Service: Cardiovascular;  Laterality: N/A;  . CATARACT EXTRACTION    . COLONOSCOPY W/ POLYPECTOMY    . ELECTROPHYSIOLOGIC STUDY N/A 07/30/2015   Procedure: Atrial Fibrillation Ablation;  Surgeon: Thompson Grayer, MD;  Location: [place] CV LAB;  Service: Cardiovascular;  Laterality: N/A;  . EXTRACORPOREAL SHOCK WAVE LITHOTRIPSY Right 10/29/2016   Procedure: RIGHT EXTRACORPOREAL SHOCK WAVE LITHOTRIPSY (ESWL);  Surgeon: Alexis Frock, MD;  [facility];  Service: Urology;  Laterality: Right;  . EYE SURGERY Right    cataract  . FEMUR FRACTURE SURGERY    . HERNIA REPAIR  07/06/2011  . INGUINAL HERNIA REPAIR Right 07/28/2013   Procedure: RIGHT INGUINAL HERNIA REPAIR;  Surgeon: Imogene Burn. Georgette Dover, MD;  Location: [place];  Service: General;  Laterality: Right;  . INGUINAL HERNIA REPAIR Right 09/22/2016   Procedure: LAPAROSCOPIC RIGHT INGUINAL HERNIA;  Surgeon: Kinsinger, Arta Bruce, MD;  [facility];  Service: General;  Laterality: Right;  With MESH  . INSERTION OF MESH Right 07/28/2013   Procedure: INSERTION OF MESH;  Surgeon: Imogene Burn. Georgette Dover, MD;  Location: [place];  Service: General;  Laterality:  Right;  Marland Kitchen KNEE ARTHROSCOPY     left  . REPLACEMENT TOTAL KNEE Left   . ROTATOR CUFF REPAIR     right  . ROTATOR CUFF REPAIR Left 03/08/2014   DR SUPPLE  . SHOULDER ARTHROSCOPY WITH ROTATOR CUFF REPAIR AND SUBACROMIAL DECOMPRESSION Left 03/08/2014   Procedure: LEFT SHOULDER ARTHROSCOPY WITH ROTATOR CUFF REPAIR/SUBACROMIAL DECOMPRESSION/DISTAL CLAVICLE RESECTION;  Surgeon: Marin Shutter, MD;  Location: [place];  Service: Orthopedics;  Laterality: Left;  . SVT ABLATION N/A 01/23/2019   Procedure: SVT ABLATION;  Surgeon: Evans Lance, MD;  Location: [place] CV LAB;  Service:  Cardiovascular;  Laterality: N/A;  . TEE WITHOUT CARDIOVERSION N/A 03/21/2012   Procedure: TRANSESOPHAGEAL ECHOCARDIOGRAM (TEE);  Surgeon: Birdie Riddle, MD;  Location: [place];  Service: Cardiovascular;  Laterality: N/A;  . TEE WITHOUT CARDIOVERSION N/A 04/24/2013   Procedure: TRANSESOPHAGEAL ECHOCARDIOGRAM (TEE);  Surgeon: Dorothy Spark, MD;  Location: [place];  Service: Cardiovascular;  Laterality: N/A;  . TEE WITHOUT CARDIOVERSION N/A 07/29/2015   Procedure: TRANSESOPHAGEAL ECHOCARDIOGRAM (TEE);  Surgeon: Fay Records, MD;  Location: [facility] ENDOSCOPY;  Service: Cardiovascular;  Laterality: N/A;  . TONSILLECTOMY    . TOTAL KNEE ARTHROPLASTY Right 05/24/2018   Procedure: RIGHT TOTAL KNEE ARTHROPLASTY;  Surgeon: Paralee Cancel, MD;  [facility];  Service: Orthopedics;  Laterality: Right;  70 mins    There were no vitals filed for this visit.  Subjective Assessment - 05/04/19 0853    Subjective  Patient reports no real change. He was a little stiff after the last visit but not too bad.    Pertinent History  A-fib, anxiety, SVT, pulmonary embolisms    Limitations  Standing;Sitting    How long can you sit comfortably?  no limit    Diagnostic tests  Notjoing on his back or hips for a while    Currently in Pain?  Yes    Pain Score  3     Pain Location  Back    Pain Orientation  Right    Pain Descriptors / Indicators  Aching    Pain Type  Chronic pain    Pain Onset  More than a month ago    Pain Frequency  Intermittent    Aggravating Factors   standingf and walking    Pain Relieving Factors  sitting    Effect of Pain on Daily Activities  standing and walking                       [facility] Adult PT Treatment/Exercise - 05/04/19 0001      Lumbar Exercises: Stretches   Passive Hamstring Stretch Limitations  reviewed for home     Lower Trunk Rotation Limitations  x20 reviewed for HEP       Lumbar Exercises: Aerobic   Nustep  L6  LE 5 min      Lumbar Exercises:  Standing   Other Standing Lumbar Exercises  shoulder extnesion with mod vc's for posutre and breathing 2x10;       Lumbar Exercises: Seated   Other Seated Lumbar Exercises  seated clamshells yellow 2x10 with emphasis on posture and breathing       Lumbar Exercises: Supine   Pelvic Tilt  10 reps;10 seconds    Other Supine Lumbar Exercises  march with PPT 2x10 given for HEP       Manual Therapy   Manual Therapy  Joint mobilization;Manual Traction    Joint Mobilization  PA and  AP glides to the knee to improve extnesion; sidelying PA mobilization from L2-L5     Manual Traction  LAD3x30 sec hold left right 1x30 but felt pulling in the quad and knee             PT Education - 05/04/19 0935    Education Details  updated HEP    Person(s) Educated  Patient    Methods  Explanation;Demonstration;Tactile cues    Comprehension  Verbalized understanding;Returned demonstration;Verbal cues required;Tactile cues required       PT Short Term Goals - 05/04/19 1037      PT SHORT TERM GOAL #1   Title  Patient will stand for > 7 min withou tincreased back and hip pain    Time  3    Period  Weeks    Status  On-going    Target Date  05/08/19      PT SHORT TERM GOAL #2   Title  Patient will ambualte 300' without singlepoint cane without increased pain    Time  3    Period  Weeks    Status  On-going    Target Date  05/08/19      PT SHORT TERM GOAL #3   Title  Patient will increase bilateral LE strength to 4+/5    Time  3    Period  Weeks    Status  On-going    Target Date  05/08/19        PT Long Term Goals - 04/17/19 1635      PT LONG TERM GOAL #1   Title  Patient will stand for 30 minutes without increased pain in order to perfrom ADL's    Time  6    Period  Weeks    Status  New    Target Date  05/29/19      PT LONG TERM GOAL #2   Title  Patient will ambualte 1000' with LRAD without increased pain    Time  6    Period  Weeks    Status  New    Target Date  05/29/19       PT LONG TERM GOAL #3   Title  Patient will demonstrate a 54% limitation on foto to show improved function    Time  6    Period  Weeks    Status  New    Target Date  05/29/19            Plan - 05/04/19 0936    Clinical Impression Statement  Patient tolerated treamtnet well. therapy trialed manul therapy to decmpress lumbar spine and for lower spine mobility. He was given an updated HEP for home and insturcution how to use the execises.    Personal Factors and Comorbidities  Comorbidity 1;Comorbidity 2;Comorbidity 3+    Comorbidities  SVT; right knee replacement; old hip fixation    Examination-Activity Limitations  Carry;Stand;Stairs;Squat;Transfers;Locomotion Level    Examination-Participation Restrictions  Community Activity;Shop    Stability/Clinical Decision Making  Evolving/Moderate complexity    Clinical Decision Making  Moderate    Rehab Potential  Good    PT Frequency  2x / week    PT Duration  6 weeks    PT Treatment/Interventions  ADLs/Self Care Home Management;Cryotherapy;Electrical Stimulation;Iontophoresis 4mg /ml Dexamethasone;Moist Heat;Ultrasound;DME Instruction;Neuromuscular re-education;Therapeutic exercise;Therapeutic activities;Patient/family education;Manual techniques;Passive range of motion;Dry needling;Taping    PT Next Visit Plan  begin core and hip strengthening. Patient given seated hip abduction already; consider supine march; supine bridge, consider LAQ; progress  PT Home Exercise Plan  hamstring and piriformins stretch; supine hip abduction, PPT, LTR, PPT with arm and with knee raises    Consulted and Agree with Plan of Care  Patient       Patient will benefit from skilled therapeutic intervention in order to improve the following deficits and impairments:  Abnormal gait, Decreased activity tolerance, Pain, Decreased strength, Decreased range of motion, Increased muscle spasms, Decreased endurance  Visit Diagnosis: Chronic bilateral low back pain  without sciatica  Pain in left hip  Other abnormalities of gait and mobility     Problem List Patient Active Problem List   Diagnosis Date Noted  . Breast lump 03/29/2019  . SVT (supraventricular tachycardia) ([place]) 01/21/2019  . S/P right total knee arthroplasty 05/24/2018  . Insomnia due to medical condition 12/23/2016  . BPH (benign prostatic hyperplasia) 10/21/2016  . Essential hypertension 10/21/2016  . MDD (major depressive disorder) 10/21/2016  . Obesity (BMI 30-39.9) 03/07/2016  . History of alcohol abuse 03/07/2016  . History of colon polyps 11/18/2015  . Paroxysmal atrial fibrillation ([facility])   . Restless legs syndrome 02/08/2014  . Generalized anxiety disorder 12/12/2013  . Recurrent unilateral inguinal hernia 07/17/2013  . Nonrheumatic mitral valve insufficiency 04/25/2013  . Current use of long term anticoagulation   . Obstructive sleep apnea 03/09/2013  . Chronic bilateral low back pain with sciatica 03/21/2012  . HLD (hyperlipidemia) 03/21/2012  . Arthralgia of hip 03/21/2012  . History of pulmonary embolism 03/19/2012  . Bilateral inguinal hernia 05/29/2011  . Uncomplicated asthma [id number]  . Anxiety state 07/18/2007    Carney Living PT DPT  05/04/2019, 10:43 AM  [facility] [address] [place], [place], [address] Phone: [phone]   Fax:  [phone]  Name: Brandon [name]. MRN: [id number] Date of Birth: 1950-04-01

## 2019-05-09 ENCOUNTER — Other Ambulatory Visit: Payer: Self-pay

## 2019-05-09 ENCOUNTER — Ambulatory Visit: Payer: Medicare Other | Attending: Family Medicine | Admitting: Physical Therapy

## 2019-05-09 ENCOUNTER — Encounter: Payer: Self-pay | Admitting: Physical Therapy

## 2019-05-09 DIAGNOSIS — M25552 Pain in left hip: Secondary | ICD-10-CM

## 2019-05-09 DIAGNOSIS — M545 Low back pain, unspecified: Secondary | ICD-10-CM

## 2019-05-09 DIAGNOSIS — G8929 Other chronic pain: Secondary | ICD-10-CM | POA: Diagnosis not present

## 2019-05-09 DIAGNOSIS — R2689 Other abnormalities of gait and mobility: Secondary | ICD-10-CM | POA: Diagnosis not present

## 2019-05-09 NOTE — Therapy (Signed)
[place], [place], [address] Phone: [phone]   Fax:  [phone]  Physical Therapy Treatment  Patient Details  Name: Brandon [name]. MRN: [id number] Date of Birth: May 08, 1950 Referring Provider (PT): Dr Dorris Singh    Encounter Date: 05/09/2019  PT End of Session - 05/09/19 0924    Visit Number  4    Number of Visits  12    Date for PT Re-Evaluation  05/29/19    Authorization Type  Mcr /BCBS    PT Start Time  0845    PT Stop Time  0926    PT Time Calculation (min)  41 min    Activity Tolerance  Patient tolerated treatment well    Behavior During Therapy  [facility] for tasks assessed/performed       Past Medical History:  Diagnosis Date  . Anxiety   . Arthritis   . Benign neoplasm of colon   . Benign prostatic hyperplasia with urinary obstruction   . Bilateral pulmonary embolism ([place]) 3/14   admitted to [facility],  treated with Xarelto  . Cataract   . Chronic pain   . History of kidney stones   . History of pulmonary embolism 03/19/2012  . Hyperlipemia   . Hypertension   . Insomnia   . Major depressive disorder, recurrent episode ([place])   . Mitral regurgitation 04/24/2013   Mild by TEE  . Obesity   . OSA (obstructive sleep apnea)    noncompliant with CPAP.  07/27/13- awaiting a CPAP- unable to tolerate mask  . Osteoporosis   . Persistent atrial fibrillation ([place])   . Restless leg syndrome    takes gabapentin  . Sciatica   . SVT (supraventricular tachycardia) ([place]) 03/07/2016  . Thrombus of left atrial appendage 03/09/2013  . Ventral hernia, unspecified, without mention of obstruction or gangrene    right abdominal wall    Past Surgical History:  Procedure Laterality Date  . CARDIOVERSION N/A 03/21/2012   Procedure: CARDIOVERSION;  Surgeon: Birdie Riddle, MD;  Location: [facility] ENDOSCOPY;  Service: Cardiovascular;  Laterality: N/A;  . CARDIOVERSION N/A 04/24/2013   Procedure: CARDIOVERSION;   Surgeon: Dorothy Spark, MD;  Location: [facility] ENDOSCOPY;  Service: Cardiovascular;  Laterality: N/A;  . CARDIOVERSION N/A 06/27/2015   Procedure: CARDIOVERSION;  Surgeon: Larey Dresser, MD;  Location: [place];  Service: Cardiovascular;  Laterality: N/A;  . CATARACT EXTRACTION    . COLONOSCOPY W/ POLYPECTOMY    . ELECTROPHYSIOLOGIC STUDY N/A 07/30/2015   Procedure: Atrial Fibrillation Ablation;  Surgeon: Thompson Grayer, MD;  Location: [place] CV LAB;  Service: Cardiovascular;  Laterality: N/A;  . EXTRACORPOREAL SHOCK WAVE LITHOTRIPSY Right 10/29/2016   Procedure: RIGHT EXTRACORPOREAL SHOCK WAVE LITHOTRIPSY (ESWL);  Surgeon: Alexis Frock, MD;  [facility];  Service: Urology;  Laterality: Right;  . EYE SURGERY Right    cataract  . FEMUR FRACTURE SURGERY    . HERNIA REPAIR  07/06/2011  . INGUINAL HERNIA REPAIR Right 07/28/2013   Procedure: RIGHT INGUINAL HERNIA REPAIR;  Surgeon: Imogene Burn. Georgette Dover, MD;  Location: [place];  Service: General;  Laterality: Right;  . INGUINAL HERNIA REPAIR Right 09/22/2016   Procedure: LAPAROSCOPIC RIGHT INGUINAL HERNIA;  Surgeon: Kinsinger, Arta Bruce, MD;  [facility];  Service: General;  Laterality: Right;  With MESH  . INSERTION OF MESH Right 07/28/2013   Procedure: INSERTION OF MESH;  Surgeon: Imogene Burn. Georgette Dover, MD;  Location: [place];  Service: General;  Laterality:  Right;  Marland Kitchen KNEE ARTHROSCOPY     left  . REPLACEMENT TOTAL KNEE Left   . ROTATOR CUFF REPAIR     right  . ROTATOR CUFF REPAIR Left 03/08/2014   DR SUPPLE  . SHOULDER ARTHROSCOPY WITH ROTATOR CUFF REPAIR AND SUBACROMIAL DECOMPRESSION Left 03/08/2014   Procedure: LEFT SHOULDER ARTHROSCOPY WITH ROTATOR CUFF REPAIR/SUBACROMIAL DECOMPRESSION/DISTAL CLAVICLE RESECTION;  Surgeon: Marin Shutter, MD;  Location: [place];  Service: Orthopedics;  Laterality: Left;  . SVT ABLATION N/A 01/23/2019   Procedure: SVT ABLATION;  Surgeon: Evans Lance, MD;  Location: [place] CV LAB;  Service:  Cardiovascular;  Laterality: N/A;  . TEE WITHOUT CARDIOVERSION N/A 03/21/2012   Procedure: TRANSESOPHAGEAL ECHOCARDIOGRAM (TEE);  Surgeon: Birdie Riddle, MD;  Location: [place];  Service: Cardiovascular;  Laterality: N/A;  . TEE WITHOUT CARDIOVERSION N/A 04/24/2013   Procedure: TRANSESOPHAGEAL ECHOCARDIOGRAM (TEE);  Surgeon: Dorothy Spark, MD;  Location: [place];  Service: Cardiovascular;  Laterality: N/A;  . TEE WITHOUT CARDIOVERSION N/A 07/29/2015   Procedure: TRANSESOPHAGEAL ECHOCARDIOGRAM (TEE);  Surgeon: Fay Records, MD;  Location: [facility] ENDOSCOPY;  Service: Cardiovascular;  Laterality: N/A;  . TONSILLECTOMY    . TOTAL KNEE ARTHROPLASTY Right 05/24/2018   Procedure: RIGHT TOTAL KNEE ARTHROPLASTY;  Surgeon: Paralee Cancel, MD;  [facility];  Service: Orthopedics;  Laterality: Right;  70 mins    There were no vitals filed for this visit.  Subjective Assessment - 05/09/19 0854    Subjective  Patient has been doing his exercises and is more aware of his posture when he is standing. he reports overall it has been doing well.    Pertinent History  A-fib, anxiety, SVT, pulmonary embolisms    Limitations  Standing;Sitting    How long can you sit comfortably?  no limit    Diagnostic tests  Notjoing on his back or hips for a while    Currently in Pain?  Yes    Pain Score  3     Pain Location  Back    Pain Orientation  Right    Pain Descriptors / Indicators  Aching    Pain Type  Chronic pain    Pain Onset  More than a month ago    Pain Frequency  Intermittent    Aggravating Factors   standing and walking    Pain Relieving Factors  sitting    Effect of Pain on Daily Activities  Stsanding and walking         [facility] PT Assessment - 05/09/19 0001      Palpation   Palpation comment  No tenderness to palpation in gluteals and lumpbar paraspinals today.                    [facility] Adult PT Treatment/Exercise - 05/09/19 0001      Lumbar Exercises: Standing   Heel  Raises Limitations  weight shifts with cuing for posture 2x10     Other Standing Lumbar Exercises  standing lsow march x10 each leg with cuing for posture. Both given for HEP.       Lumbar Exercises: Seated   Other Seated Lumbar Exercises  seated clamshells yellow 2x10 with emphasis on posture and breathing       Lumbar Exercises: Supine   Pelvic Tilt  10 reps;10 seconds    Other Supine Lumbar Exercises  march with PPT 2x10 given for HEP       Manual Therapy   Manual Therapy  Joint mobilization;Manual  Traction    Joint Mobilization  PA and AP glides to the knee to improve extnesion; sidelying PA mobilization from L2-L5     Manual Traction  LAD3x30 sec hold left right 1x30 but felt pulling in the quad and knee             PT Education - 05/09/19 0924    Education Details  updated HEP and symptom management    Person(s) Educated  Patient    Methods  Explanation;Demonstration;Tactile cues;Verbal cues    Comprehension  Verbalized understanding;Returned demonstration;Verbal cues required;Tactile cues required       PT Short Term Goals - 05/04/19 1037      PT SHORT TERM GOAL #1   Title  Patient will stand for > 7 min withou tincreased back and hip pain    Time  3    Period  Weeks    Status  On-going    Target Date  05/08/19      PT SHORT TERM GOAL #2   Title  Patient will ambualte 300' without singlepoint cane without increased pain    Time  3    Period  Weeks    Status  On-going    Target Date  05/08/19      PT SHORT TERM GOAL #3   Title  Patient will increase bilateral LE strength to 4+/5    Time  3    Period  Weeks    Status  On-going    Target Date  05/08/19        PT Long Term Goals - 04/17/19 1635      PT LONG TERM GOAL #1   Title  Patient will stand for 30 minutes without increased pain in order to perfrom ADL's    Time  6    Period  Weeks    Status  New    Target Date  05/29/19      PT LONG TERM GOAL #2   Title  Patient will ambualte 1000' with  LRAD without increased pain    Time  6    Period  Weeks    Status  New    Target Date  05/29/19      PT LONG TERM GOAL #3   Title  Patient will demonstrate a 54% limitation on foto to show improved function    Time  6    Period  Weeks    Status  New    Target Date  05/29/19            Plan - 05/09/19 1011    Clinical Impression Statement  Patient is making progress. He had much less spasming in his back today. He had no tenderness to palkpation. Therapy reviewed patellar mobilization for the knee. he had improved knee extension with hamstring and IT band release. Therapy added standing exercises. He required mion Vc's for posture with standing exercises. Therapy will continue to emphasize the improtance of posture and advance standing ther-ex as tolerated. He was advised to pick 3-4 exercises from what has been given to him.    Comorbidities  SVT; right knee replacement; old hip fixation    Examination-Activity Limitations  Carry;Stand;Stairs;Squat;Transfers;Locomotion Level    Stability/Clinical Decision Making  Evolving/Moderate complexity    Clinical Decision Making  Moderate    Rehab Potential  Good    PT Frequency  2x / week    PT Duration  6 weeks    PT Treatment/Interventions  ADLs/Self Care Home Management;Cryotherapy;Electrical Stimulation;Iontophoresis 4mg /ml  Dexamethasone;Moist Heat;Ultrasound;DME Instruction;Neuromuscular re-education;Therapeutic exercise;Therapeutic activities;Patient/family education;Manual techniques;Passive range of motion;Dry needling;Taping    PT Next Visit Plan  continue core strengthening; manual therapy PRN; add standing hip abduction and extension    PT Home Exercise Plan  hamstring and piriformins stretch; supine hip abduction, PPT, LTR, PPT with arm and with knee raises    Consulted and Agree with Plan of Care  Patient       Patient will benefit from skilled therapeutic intervention in order to improve the following deficits and  impairments:     Visit Diagnosis: Chronic bilateral low back pain without sciatica  Pain in left hip  Other abnormalities of gait and mobility     Problem List Patient Active Problem List   Diagnosis Date Noted  . Breast lump 03/29/2019  . SVT (supraventricular tachycardia) ([place]) 01/21/2019  . S/P right total knee arthroplasty 05/24/2018  . Insomnia due to medical condition 12/23/2016  . BPH (benign prostatic hyperplasia) 10/21/2016  . Essential hypertension 10/21/2016  . MDD (major depressive disorder) 10/21/2016  . Obesity (BMI 30-39.9) 03/07/2016  . History of alcohol abuse 03/07/2016  . History of colon polyps 11/18/2015  . Paroxysmal atrial fibrillation (HCC)   . Restless legs syndrome 02/08/2014  . Generalized anxiety disorder 12/12/2013  . Recurrent unilateral inguinal hernia 07/17/2013  . Nonrheumatic mitral valve insufficiency 04/25/2013  . Current use of long term anticoagulation   . Obstructive sleep apnea 03/09/2013  . Chronic bilateral low back pain with sciatica 03/21/2012  . HLD (hyperlipidemia) 03/21/2012  . Arthralgia of hip 03/21/2012  . History of pulmonary embolism 03/19/2012  . Bilateral inguinal hernia 05/29/2011  . Uncomplicated asthma [id number]  . Anxiety state 07/18/2007    Carney Living PT DPT  05/09/2019, 10:39 AM  [facility] [address] [place], [place], [address] Phone: [phone]   Fax:  [phone]  Name: Brandon [name]. MRN: [id number] Date of Birth: Nov 20, 1950

## 2019-05-11 ENCOUNTER — Encounter: Payer: Self-pay | Admitting: Physical Therapy

## 2019-05-11 ENCOUNTER — Ambulatory Visit: Payer: Medicare Other | Admitting: Physical Therapy

## 2019-05-11 ENCOUNTER — Other Ambulatory Visit: Payer: Self-pay

## 2019-05-11 DIAGNOSIS — M545 Low back pain, unspecified: Secondary | ICD-10-CM

## 2019-05-11 DIAGNOSIS — R2689 Other abnormalities of gait and mobility: Secondary | ICD-10-CM | POA: Diagnosis not present

## 2019-05-11 DIAGNOSIS — G8929 Other chronic pain: Secondary | ICD-10-CM | POA: Diagnosis not present

## 2019-05-11 DIAGNOSIS — M25552 Pain in left hip: Secondary | ICD-10-CM | POA: Diagnosis not present

## 2019-05-12 NOTE — Therapy (Signed)
[place], [place], [address] Phone: [phone]   Fax:  [phone]  Physical Therapy Treatment  Patient Details  Name: Brandon [name]. MRN: [id number] Date of Birth: [birth year] Referring Provider (PT): Dr Dorris Singh    Encounter Date: 05/11/2019  PT End of Session - 05/11/19 1626    Visit Number  5    Number of Visits  12    Date for PT Re-Evaluation  05/29/19    Authorization Type  Mcr /BCBS    PT Start Time  0930    PT Stop Time  1012    PT Time Calculation (min)  42 min    Activity Tolerance  Patient tolerated treatment well    Behavior During Therapy  [facility] for tasks assessed/performed       Past Medical History:  Diagnosis Date  . Anxiety   . Arthritis   . Benign neoplasm of colon   . Benign prostatic hyperplasia with urinary obstruction   . Bilateral pulmonary embolism ([place]) 3/14   admitted to [facility],  treated with Xarelto  . Cataract   . Chronic pain   . History of kidney stones   . History of pulmonary embolism 03/19/2012  . Hyperlipemia   . Hypertension   . Insomnia   . Major depressive disorder, recurrent episode ([place])   . Mitral regurgitation 04/24/2013   Mild by TEE  . Obesity   . OSA (obstructive sleep apnea)    noncompliant with CPAP.  07/27/13- awaiting a CPAP- unable to tolerate mask  . Osteoporosis   . Persistent atrial fibrillation ([place])   . Restless leg syndrome    takes gabapentin  . Sciatica   . SVT (supraventricular tachycardia) ([place]) 03/07/2016  . Thrombus of left atrial appendage 03/09/2013  . Ventral hernia, unspecified, without mention of obstruction or gangrene    right abdominal wall    Past Surgical History:  Procedure Laterality Date  . CARDIOVERSION N/A 03/21/2012   Procedure: CARDIOVERSION;  Surgeon: Birdie Riddle, MD;  Location: [facility] ENDOSCOPY;  Service: Cardiovascular;  Laterality: N/A;  . CARDIOVERSION N/A 04/24/2013   Procedure: CARDIOVERSION;   Surgeon: Dorothy Spark, MD;  Location: [facility] ENDOSCOPY;  Service: Cardiovascular;  Laterality: N/A;  . CARDIOVERSION N/A 06/27/2015   Procedure: CARDIOVERSION;  Surgeon: Larey Dresser, MD;  Location: [place];  Service: Cardiovascular;  Laterality: N/A;  . CATARACT EXTRACTION    . COLONOSCOPY W/ POLYPECTOMY    . ELECTROPHYSIOLOGIC STUDY N/A 07/30/2015   Procedure: Atrial Fibrillation Ablation;  Surgeon: Thompson Grayer, MD;  Location: [place] CV LAB;  Service: Cardiovascular;  Laterality: N/A;  . EXTRACORPOREAL SHOCK WAVE LITHOTRIPSY Right 10/29/2016   Procedure: RIGHT EXTRACORPOREAL SHOCK WAVE LITHOTRIPSY (ESWL);  Surgeon: Alexis Frock, MD;  [facility];  Service: Urology;  Laterality: Right;  . EYE SURGERY Right    cataract  . FEMUR FRACTURE SURGERY    . HERNIA REPAIR  07/06/2011  . INGUINAL HERNIA REPAIR Right 07/28/2013   Procedure: RIGHT INGUINAL HERNIA REPAIR;  Surgeon: Imogene Burn. Georgette Dover, MD;  Location: [place];  Service: General;  Laterality: Right;  . INGUINAL HERNIA REPAIR Right 09/22/2016   Procedure: LAPAROSCOPIC RIGHT INGUINAL HERNIA;  Surgeon: Kinsinger, Arta Bruce, MD;  [facility];  Service: General;  Laterality: Right;  With MESH  . INSERTION OF MESH Right 07/28/2013   Procedure: INSERTION OF MESH;  Surgeon: Imogene Burn. Georgette Dover, MD;  Location: [place];  Service: General;  Laterality:  Right;  Marland Kitchen KNEE ARTHROSCOPY     left  . REPLACEMENT TOTAL KNEE Left   . ROTATOR CUFF REPAIR     right  . ROTATOR CUFF REPAIR Left 03/08/2014   DR SUPPLE  . SHOULDER ARTHROSCOPY WITH ROTATOR CUFF REPAIR AND SUBACROMIAL DECOMPRESSION Left 03/08/2014   Procedure: LEFT SHOULDER ARTHROSCOPY WITH ROTATOR CUFF REPAIR/SUBACROMIAL DECOMPRESSION/DISTAL CLAVICLE RESECTION;  Surgeon: Marin Shutter, MD;  Location: [place];  Service: Orthopedics;  Laterality: Left;  . SVT ABLATION N/A 01/23/2019   Procedure: SVT ABLATION;  Surgeon: Evans Lance, MD;  Location: [place] CV LAB;  Service:  Cardiovascular;  Laterality: N/A;  . TEE WITHOUT CARDIOVERSION N/A 03/21/2012   Procedure: TRANSESOPHAGEAL ECHOCARDIOGRAM (TEE);  Surgeon: Birdie Riddle, MD;  Location: [place];  Service: Cardiovascular;  Laterality: N/A;  . TEE WITHOUT CARDIOVERSION N/A 04/24/2013   Procedure: TRANSESOPHAGEAL ECHOCARDIOGRAM (TEE);  Surgeon: Dorothy Spark, MD;  Location: [place];  Service: Cardiovascular;  Laterality: N/A;  . TEE WITHOUT CARDIOVERSION N/A 07/29/2015   Procedure: TRANSESOPHAGEAL ECHOCARDIOGRAM (TEE);  Surgeon: Fay Records, MD;  Location: [facility] ENDOSCOPY;  Service: Cardiovascular;  Laterality: N/A;  . TONSILLECTOMY    . TOTAL KNEE ARTHROPLASTY Right 05/24/2018   Procedure: RIGHT TOTAL KNEE ARTHROPLASTY;  Surgeon: Paralee Cancel, MD;  [facility];  Service: Orthopedics;  Laterality: Right;  70 mins    There were no vitals filed for this visit.  Subjective Assessment - 05/11/19 1004    Subjective  Patient reports his left hip has been sore this morning. He dose not remember doing anything. He just woke up with pain this morning. He had been working on his exercises at home but does not feel like that is the cause of his pain.    Pertinent History  A-fib, anxiety, SVT, pulmonary embolisms    Limitations  Standing;Sitting    How long can you sit comfortably?  no limit    Diagnostic tests  Notjoing on his back or hips for a while    Currently in Pain?  Yes    Pain Score  5     Pain Location  Hip    Pain Orientation  Right    Pain Descriptors / Indicators  Aching    Pain Type  Chronic pain    Pain Onset  More than a month ago    Pain Frequency  Intermittent    Multiple Pain Sites  No                       [facility] Adult PT Treatment/Exercise - 05/12/19 0001      Ambulation/Gait   Gait Comments  Patient worked in [employer] walking with minimum hanbd hled support. He tends to walk with a narrow bace supinating his lleft foot when he dosent have support. He  continues to have significant lateral movement with gait. Patient worked on weight shifting straight with hand held support onto both legs x20. he did better with the left. Patient had increadsed back pain with trials walking with minimal support in the II bars.       Lumbar Exercises: Stretches   Lower Trunk Rotation Limitations  x20 reviewed for HEP     Piriformis Stretch Limitations  3x20 sec hold       Lumbar Exercises: Standing   Heel Raises Limitations  weight shifts with cuing for posture 2x10     Other Standing Lumbar Exercises  standing lsow march x10 each leg with  cuing for posture. Both given for HEP.       Lumbar Exercises: Seated   Other Seated Lumbar Exercises  seated clamshells yellow 2x10 with emphasis on posture and breathing       Lumbar Exercises: Supine   Other Supine Lumbar Exercises  march with PPT 2x10 given for HEP       Manual Therapy   Manual Therapy  Joint mobilization;Manual Traction    Joint Mobilization  PA and AP glides to the knee to improve extnesion; sidelying PA mobilization from L2-L5     Manual Traction  LAD3x30 sec hold left right 1x30 but felt pulling in the quad and knee             PT Education - 05/11/19 1625    Education Details  education on how gait relates to hip / back pain    Person(s) Educated  Patient    Methods  Explanation;Tactile cues;Verbal cues;Demonstration    Comprehension  Verbalized understanding;Returned demonstration;Verbal cues required;Tactile cues required       PT Short Term Goals - 05/04/19 1037      PT SHORT TERM GOAL #1   Title  Patient will stand for > 7 min withou tincreased back and hip pain    Time  3    Period  Weeks    Status  On-going    Target Date  05/08/19      PT SHORT TERM GOAL #2   Title  Patient will ambualte 300' without singlepoint cane without increased pain    Time  3    Period  Weeks    Status  On-going    Target Date  05/08/19      PT SHORT TERM GOAL #3   Title  Patient will  increase bilateral LE strength to 4+/5    Time  3    Period  Weeks    Status  On-going    Target Date  05/08/19        PT Long Term Goals - 04/17/19 1635      PT LONG TERM GOAL #1   Title  Patient will stand for 30 minutes without increased pain in order to perfrom ADL's    Time  6    Period  Weeks    Status  New    Target Date  05/29/19      PT LONG TERM GOAL #2   Title  Patient will ambualte 1000' with LRAD without increased pain    Time  6    Period  Weeks    Status  New    Target Date  05/29/19      PT LONG TERM GOAL #3   Title  Patient will demonstrate a 54% limitation on foto to show improved function    Time  6    Period  Weeks    Status  New    Target Date  05/29/19            Plan - 05/11/19 1627    Clinical Impression Statement  Significant time spent reviewed ng gait. He has decreased singlel eg stance time on the left and significant lateral movement. His left foot appears to supinate. He reports his shoes wear on the inside. The patient walks with a narrow base of support without the cane and with a single leg stability in the II bars. He tolerated stretches and ther-ex well. He came in with left hip pain which resolved with manual therapy.  Personal Factors and Comorbidities  Comorbidity 1;Comorbidity 2;Comorbidity 3+    Comorbidities  SVT; right knee replacement; old hip fixation    Examination-Activity Limitations  Carry;Stand;Stairs;Squat;Transfers;Locomotion Level    Examination-Participation Restrictions  Community Activity;Shop    Stability/Clinical Decision Making  Evolving/Moderate complexity    Clinical Decision Making  Moderate    Rehab Potential  Good    PT Frequency  2x / week    PT Duration  6 weeks    PT Treatment/Interventions  ADLs/Self Care Home Management;Cryotherapy;Electrical Stimulation;Iontophoresis 4mg /ml Dexamethasone;Moist Heat;Ultrasound;DME Instruction;Neuromuscular re-education;Therapeutic exercise;Therapeutic  activities;Patient/family education;Manual techniques;Passive range of motion;Dry needling;Taping    PT Next Visit Plan  continue core strengthening; manual therapy PRN; add standing hip abduction and extension    PT Home Exercise Plan  hamstring and piriformins stretch; supine hip abduction, PPT, LTR, PPT with arm and with knee raises    Consulted and Agree with Plan of Care  Patient       Patient will benefit from skilled therapeutic intervention in order to improve the following deficits and impairments:  Abnormal gait, Decreased activity tolerance, Pain, Decreased strength, Decreased range of motion, Increased muscle spasms, Decreased endurance  Visit Diagnosis: Chronic bilateral low back pain without sciatica  Pain in left hip  Other abnormalities of gait and mobility     Problem List Patient Active Problem List   Diagnosis Date Noted  . Breast lump 03/29/2019  . SVT (supraventricular tachycardia) ([place]) 01/21/2019  . S/P right total knee arthroplasty 05/24/2018  . Insomnia due to medical condition 12/23/2016  . BPH (benign prostatic hyperplasia) 10/21/2016  . Essential hypertension 10/21/2016  . MDD (major depressive disorder) 10/21/2016  . Obesity (BMI 30-39.9) 03/07/2016  . History of alcohol abuse 03/07/2016  . History of colon polyps 11/18/2015  . Paroxysmal atrial fibrillation ([facility])   . Restless legs syndrome 02/08/2014  . Generalized anxiety disorder 12/12/2013  . Recurrent unilateral inguinal hernia 07/17/2013  . Nonrheumatic mitral valve insufficiency 04/25/2013  . Current use of long term anticoagulation   . Obstructive sleep apnea 03/09/2013  . Chronic bilateral low back pain with sciatica 03/21/2012  . HLD (hyperlipidemia) 03/21/2012  . Arthralgia of hip 03/21/2012  . History of pulmonary embolism 03/19/2012  . Bilateral inguinal hernia 05/29/2011  . Uncomplicated asthma [id number]  . Anxiety state 07/18/2007    Carney Living PT DPT  05/12/2019, 10:20  AM  [facility] [address] [place], [place], [address] Phone: [phone]   Fax:  [phone]  Name: Brandon [name]. MRN: [id number] Date of Birth: [birth year]

## 2019-05-16 ENCOUNTER — Encounter: Payer: Self-pay | Admitting: Physical Therapy

## 2019-05-16 ENCOUNTER — Ambulatory Visit: Payer: Medicare Other | Admitting: Physical Therapy

## 2019-05-16 ENCOUNTER — Other Ambulatory Visit: Payer: Self-pay

## 2019-05-16 DIAGNOSIS — M545 Low back pain, unspecified: Secondary | ICD-10-CM

## 2019-05-16 DIAGNOSIS — G8929 Other chronic pain: Secondary | ICD-10-CM | POA: Diagnosis not present

## 2019-05-16 DIAGNOSIS — R2689 Other abnormalities of gait and mobility: Secondary | ICD-10-CM | POA: Diagnosis not present

## 2019-05-16 DIAGNOSIS — M25552 Pain in left hip: Secondary | ICD-10-CM | POA: Diagnosis not present

## 2019-05-16 NOTE — Therapy (Signed)
[place], [place], [address] Phone: [phone]   Fax:  [phone]  Physical Therapy Treatment  Patient Details  Name: Brandon [name]. MRN: [id number] Date of Birth: October 30, 1950 Referring Provider (PT): Dr Dorris Singh    Encounter Date: 05/16/2019  PT End of Session - 05/16/19 1119    Visit Number  6    Number of Visits  12    Date for PT Re-Evaluation  05/29/19    Authorization Type  Mcr /BCBS    PT Start Time  0930    PT Stop Time  1014    PT Time Calculation (min)  44 min    Activity Tolerance  Patient tolerated treatment well    Behavior During Therapy  [facility] for tasks assessed/performed       Past Medical History:  Diagnosis Date  . Anxiety   . Arthritis   . Benign neoplasm of colon   . Benign prostatic hyperplasia with urinary obstruction   . Bilateral pulmonary embolism ([place]) 3/14   admitted to [facility],  treated with Xarelto  . Cataract   . Chronic pain   . History of kidney stones   . History of pulmonary embolism 03/19/2012  . Hyperlipemia   . Hypertension   . Insomnia   . Major depressive disorder, recurrent episode ([place])   . Mitral regurgitation 04/24/2013   Mild by TEE  . Obesity   . OSA (obstructive sleep apnea)    noncompliant with CPAP.  07/27/13- awaiting a CPAP- unable to tolerate mask  . Osteoporosis   . Persistent atrial fibrillation ([place])   . Restless leg syndrome    takes gabapentin  . Sciatica   . SVT (supraventricular tachycardia) ([place]) 03/07/2016  . Thrombus of left atrial appendage 03/09/2013  . Ventral hernia, unspecified, without mention of obstruction or gangrene    right abdominal wall    Past Surgical History:  Procedure Laterality Date  . CARDIOVERSION N/A 03/21/2012   Procedure: CARDIOVERSION;  Surgeon: Birdie Riddle, MD;  Location: [facility] ENDOSCOPY;  Service: Cardiovascular;  Laterality: N/A;  . CARDIOVERSION N/A 04/24/2013   Procedure: CARDIOVERSION;   Surgeon: Dorothy Spark, MD;  Location: [facility] ENDOSCOPY;  Service: Cardiovascular;  Laterality: N/A;  . CARDIOVERSION N/A 06/27/2015   Procedure: CARDIOVERSION;  Surgeon: Larey Dresser, MD;  Location: [place];  Service: Cardiovascular;  Laterality: N/A;  . CATARACT EXTRACTION    . COLONOSCOPY W/ POLYPECTOMY    . ELECTROPHYSIOLOGIC STUDY N/A 07/30/2015   Procedure: Atrial Fibrillation Ablation;  Surgeon: Thompson Grayer, MD;  Location: [place] CV LAB;  Service: Cardiovascular;  Laterality: N/A;  . EXTRACORPOREAL SHOCK WAVE LITHOTRIPSY Right 10/29/2016   Procedure: RIGHT EXTRACORPOREAL SHOCK WAVE LITHOTRIPSY (ESWL);  Surgeon: Alexis Frock, MD;  [facility];  Service: Urology;  Laterality: Right;  . EYE SURGERY Right    cataract  . FEMUR FRACTURE SURGERY    . HERNIA REPAIR  07/06/2011  . INGUINAL HERNIA REPAIR Right 07/28/2013   Procedure: RIGHT INGUINAL HERNIA REPAIR;  Surgeon: Imogene Burn. Georgette Dover, MD;  Location: [place];  Service: General;  Laterality: Right;  . INGUINAL HERNIA REPAIR Right 09/22/2016   Procedure: LAPAROSCOPIC RIGHT INGUINAL HERNIA;  Surgeon: Kinsinger, Arta Bruce, MD;  [facility];  Service: General;  Laterality: Right;  With MESH  . INSERTION OF MESH Right 07/28/2013   Procedure: INSERTION OF MESH;  Surgeon: Imogene Burn. Georgette Dover, MD;  Location: [place];  Service: General;  Laterality:  Right;  Marland Kitchen KNEE ARTHROSCOPY     left  . REPLACEMENT TOTAL KNEE Left   . ROTATOR CUFF REPAIR     right  . ROTATOR CUFF REPAIR Left 03/08/2014   DR SUPPLE  . SHOULDER ARTHROSCOPY WITH ROTATOR CUFF REPAIR AND SUBACROMIAL DECOMPRESSION Left 03/08/2014   Procedure: LEFT SHOULDER ARTHROSCOPY WITH ROTATOR CUFF REPAIR/SUBACROMIAL DECOMPRESSION/DISTAL CLAVICLE RESECTION;  Surgeon: Marin Shutter, MD;  Location: [place];  Service: Orthopedics;  Laterality: Left;  . SVT ABLATION N/A 01/23/2019   Procedure: SVT ABLATION;  Surgeon: Evans Lance, MD;  Location: [place] CV LAB;  Service:  Cardiovascular;  Laterality: N/A;  . TEE WITHOUT CARDIOVERSION N/A 03/21/2012   Procedure: TRANSESOPHAGEAL ECHOCARDIOGRAM (TEE);  Surgeon: Birdie Riddle, MD;  Location: [place];  Service: Cardiovascular;  Laterality: N/A;  . TEE WITHOUT CARDIOVERSION N/A 04/24/2013   Procedure: TRANSESOPHAGEAL ECHOCARDIOGRAM (TEE);  Surgeon: Dorothy Spark, MD;  Location: [place];  Service: Cardiovascular;  Laterality: N/A;  . TEE WITHOUT CARDIOVERSION N/A 07/29/2015   Procedure: TRANSESOPHAGEAL ECHOCARDIOGRAM (TEE);  Surgeon: Fay Records, MD;  Location: [facility] ENDOSCOPY;  Service: Cardiovascular;  Laterality: N/A;  . TONSILLECTOMY    . TOTAL KNEE ARTHROPLASTY Right 05/24/2018   Procedure: RIGHT TOTAL KNEE ARTHROPLASTY;  Surgeon: Paralee Cancel, MD;  [facility];  Service: Orthopedics;  Laterality: Right;  70 mins    There were no vitals filed for this visit.  Subjective Assessment - 05/16/19 0935    Subjective  Patient reports no significant complaints after is last visit. he had a little soreness over the weekend but it was tolerable. He has been working on his standing exercises.    Pertinent History  A-fib, anxiety, SVT, pulmonary embolisms    Limitations  Standing;Sitting    How long can you sit comfortably?  no limit    Currently in Pain?  Yes    Pain Score  3     Pain Location  Back    Pain Orientation  Right    Pain Descriptors / Indicators  Aching    Pain Type  Chronic pain    Pain Onset  More than a month ago    Pain Frequency  Intermittent    Aggravating Factors   standing and walking    Pain Relieving Factors  sitting                       [facility] Adult PT Treatment/Exercise - 05/16/19 0001      High Level Balance   High Level Balance Comments  step and hold onto air-ex x10 eaxch leg with cuing and min gaurd; narrow base on air-ex 3x30 sec hold       Lumbar Exercises: Stretches   Lower Trunk Rotation Limitations  x20 reviewed for HEP     Piriformis Stretch  Limitations  3x20 sec hold       Lumbar Exercises: Standing   Heel Raises Limitations  weight shifts with cuing for posture 2x10     Other Standing Lumbar Exercises  Scap retraction green with abdominal bracing x20     Other Standing Lumbar Exercises  Shoulder extnesion red x20 with abdominal breathing       Lumbar Exercises: Supine   Pelvic Tilt  10 reps;10 seconds    Other Supine Lumbar Exercises  march with PPT 2x10 given for HEP     Other Supine Lumbar Exercises  Supine clamshell x20 red with abdominal bracing  Manual Therapy   Manual Therapy  Joint mobilization;Manual Traction    Joint Mobilization  PA and AP glides to the knee to improve extnesion; sidelying PA mobilization from L2-L5     Manual Traction  LAD3x30 sec hold left right 1x30 but felt pulling in the quad and knee             PT Education - 05/16/19 1121    Education Details  purpose of balance activity    Person(s) Educated  Patient    Methods  Explanation;Tactile cues;Verbal cues;Demonstration    Comprehension  Verbalized understanding;Returned demonstration;Verbal cues required;Tactile cues required       PT Short Term Goals - 05/04/19 1037      PT SHORT TERM GOAL #1   Title  Patient will stand for > 7 min withou tincreased back and hip pain    Time  3    Period  Weeks    Status  On-going    Target Date  05/08/19      PT SHORT TERM GOAL #2   Title  Patient will ambualte 300' without singlepoint cane without increased pain    Time  3    Period  Weeks    Status  On-going    Target Date  05/08/19      PT SHORT TERM GOAL #3   Title  Patient will increase bilateral LE strength to 4+/5    Time  3    Period  Weeks    Status  On-going    Target Date  05/08/19        PT Long Term Goals - 04/17/19 1635      PT LONG TERM GOAL #1   Title  Patient will stand for 30 minutes without increased pain in order to perfrom ADL's    Time  6    Period  Weeks    Status  New    Target Date  05/29/19       PT LONG TERM GOAL #2   Title  Patient will ambualte 1000' with LRAD without increased pain    Time  6    Period  Weeks    Status  New    Target Date  05/29/19      PT LONG TERM GOAL #3   Title  Patient will demonstrate a 54% limitation on foto to show improved function    Time  6    Period  Weeks    Status  New    Target Date  05/29/19            Plan - 05/16/19 1115    Clinical Impression Statement  Therapy perfromed manual therapy to the lumbar spine in side lying. He has spadsming around the parapsinals. He was encouraged to continue with tennis ball at home. Therapy also worked on balance exercises in standing. He tolerated all activity well. He was encouraged to continue stretching and strengthening at home.    Personal Factors and Comorbidities  Comorbidity 1;Comorbidity 2;Comorbidity 3+    Comorbidities  SVT; right knee replacement; old hip fixation    Examination-Activity Limitations  Carry;Stand;Stairs;Squat;Transfers;Locomotion Level    Stability/Clinical Decision Making  Evolving/Moderate complexity    Clinical Decision Making  Moderate    Rehab Potential  Good    PT Frequency  2x / week    PT Duration  6 weeks    PT Treatment/Interventions  ADLs/Self Care Home Management;Cryotherapy;Electrical Stimulation;Iontophoresis 4mg /ml Dexamethasone;Moist Heat;Ultrasound;DME Instruction;Neuromuscular re-education;Therapeutic exercise;Therapeutic activities;Patient/family education;Manual techniques;Passive range  of motion;Dry needling;Taping    PT Next Visit Plan  continue core strengthening; manual therapy PRN; add standing hip abduction and extension    PT Home Exercise Plan  hamstring and piriformins stretch; supine hip abduction, PPT, LTR, PPT with arm and with knee raises    Consulted and Agree with Plan of Care  Patient       Patient will benefit from skilled therapeutic intervention in order to improve the following deficits and impairments:  Abnormal gait,  Decreased activity tolerance, Pain, Decreased strength, Decreased range of motion, Increased muscle spasms, Decreased endurance  Visit Diagnosis: Chronic bilateral low back pain without sciatica  Pain in left hip  Other abnormalities of gait and mobility     Problem List Patient Active Problem List   Diagnosis Date Noted  . Breast lump 03/29/2019  . SVT (supraventricular tachycardia) ([place]) 01/21/2019  . S/P right total knee arthroplasty 05/24/2018  . Insomnia due to medical condition 12/23/2016  . BPH (benign prostatic hyperplasia) 10/21/2016  . Essential hypertension 10/21/2016  . MDD (major depressive disorder) 10/21/2016  . Obesity (BMI 30-39.9) 03/07/2016  . History of alcohol abuse 03/07/2016  . History of colon polyps 11/18/2015  . Paroxysmal atrial fibrillation (HCC)   . Restless legs syndrome 02/08/2014  . Generalized anxiety disorder 12/12/2013  . Recurrent unilateral inguinal hernia 07/17/2013  . Nonrheumatic mitral valve insufficiency 04/25/2013  . Current use of long term anticoagulation   . Obstructive sleep apnea 03/09/2013  . Chronic bilateral low back pain with sciatica 03/21/2012  . HLD (hyperlipidemia) 03/21/2012  . Arthralgia of hip 03/21/2012  . History of pulmonary embolism 03/19/2012  . Bilateral inguinal hernia 05/29/2011  . Uncomplicated asthma [id number]  . Anxiety state 07/18/2007    Carney Living PT DPT  05/16/2019, 11:28 AM  [facility] [address] [place], [place], [address] Phone: [phone]   Fax:  [phone]  Name: Brandon [name]. MRN: [id number] Date of Birth: April 14, 1950

## 2019-05-18 ENCOUNTER — Other Ambulatory Visit: Payer: Self-pay

## 2019-05-18 ENCOUNTER — Encounter: Payer: Self-pay | Admitting: Physical Therapy

## 2019-05-18 ENCOUNTER — Ambulatory Visit: Payer: Medicare Other | Admitting: Physical Therapy

## 2019-05-18 DIAGNOSIS — M545 Low back pain, unspecified: Secondary | ICD-10-CM

## 2019-05-18 DIAGNOSIS — R2689 Other abnormalities of gait and mobility: Secondary | ICD-10-CM | POA: Diagnosis not present

## 2019-05-18 DIAGNOSIS — G8929 Other chronic pain: Secondary | ICD-10-CM

## 2019-05-18 DIAGNOSIS — M25552 Pain in left hip: Secondary | ICD-10-CM | POA: Diagnosis not present

## 2019-05-18 NOTE — Therapy (Signed)
[place], [place], [address] Phone: [phone]   Fax:  [phone]  Physical Therapy Treatment  Patient Details  Name: Brandon [name]. MRN: [id number] Date of Birth: June 22, 1950 Referring Provider (PT): Dr Dorris Singh    Encounter Date: 05/18/2019  PT End of Session - 05/18/19 1004    Visit Number  7    Number of Visits  12    Date for PT Re-Evaluation  05/29/19    Authorization Type  Mcr /BCBS    PT Start Time  0930    PT Stop Time  1010    PT Time Calculation (min)  40 min    Activity Tolerance  Patient tolerated treatment well    Behavior During Therapy  [facility] for tasks assessed/performed       Past Medical History:  Diagnosis Date  . Anxiety   . Arthritis   . Benign neoplasm of colon   . Benign prostatic hyperplasia with urinary obstruction   . Bilateral pulmonary embolism ([place]) 3/14   admitted to [facility],  treated with Xarelto  . Cataract   . Chronic pain   . History of kidney stones   . History of pulmonary embolism 03/19/2012  . Hyperlipemia   . Hypertension   . Insomnia   . Major depressive disorder, recurrent episode ([place])   . Mitral regurgitation 04/24/2013   Mild by TEE  . Obesity   . OSA (obstructive sleep apnea)    noncompliant with CPAP.  07/27/13- awaiting a CPAP- unable to tolerate mask  . Osteoporosis   . Persistent atrial fibrillation ([place])   . Restless leg syndrome    takes gabapentin  . Sciatica   . SVT (supraventricular tachycardia) ([place]) 03/07/2016  . Thrombus of left atrial appendage 03/09/2013  . Ventral hernia, unspecified, without mention of obstruction or gangrene    right abdominal wall    Past Surgical History:  Procedure Laterality Date  . CARDIOVERSION N/A 03/21/2012   Procedure: CARDIOVERSION;  Surgeon: Birdie Riddle, MD;  Location: [facility] ENDOSCOPY;  Service: Cardiovascular;  Laterality: N/A;  . CARDIOVERSION N/A 04/24/2013   Procedure: CARDIOVERSION;   Surgeon: Dorothy Spark, MD;  Location: [facility] ENDOSCOPY;  Service: Cardiovascular;  Laterality: N/A;  . CARDIOVERSION N/A 06/27/2015   Procedure: CARDIOVERSION;  Surgeon: Larey Dresser, MD;  Location: [place];  Service: Cardiovascular;  Laterality: N/A;  . CATARACT EXTRACTION    . COLONOSCOPY W/ POLYPECTOMY    . ELECTROPHYSIOLOGIC STUDY N/A 07/30/2015   Procedure: Atrial Fibrillation Ablation;  Surgeon: Thompson Grayer, MD;  Location: [place] CV LAB;  Service: Cardiovascular;  Laterality: N/A;  . EXTRACORPOREAL SHOCK WAVE LITHOTRIPSY Right 10/29/2016   Procedure: RIGHT EXTRACORPOREAL SHOCK WAVE LITHOTRIPSY (ESWL);  Surgeon: Alexis Frock, MD;  [facility];  Service: Urology;  Laterality: Right;  . EYE SURGERY Right    cataract  . FEMUR FRACTURE SURGERY    . HERNIA REPAIR  07/06/2011  . INGUINAL HERNIA REPAIR Right 07/28/2013   Procedure: RIGHT INGUINAL HERNIA REPAIR;  Surgeon: Imogene Burn. Georgette Dover, MD;  Location: [place];  Service: General;  Laterality: Right;  . INGUINAL HERNIA REPAIR Right 09/22/2016   Procedure: LAPAROSCOPIC RIGHT INGUINAL HERNIA;  Surgeon: Kinsinger, Arta Bruce, MD;  [facility];  Service: General;  Laterality: Right;  With MESH  . INSERTION OF MESH Right 07/28/2013   Procedure: INSERTION OF MESH;  Surgeon: Imogene Burn. Georgette Dover, MD;  Location: [place];  Service: General;  Laterality:  Right;  Marland Kitchen KNEE ARTHROSCOPY     left  . REPLACEMENT TOTAL KNEE Left   . ROTATOR CUFF REPAIR     right  . ROTATOR CUFF REPAIR Left 03/08/2014   DR SUPPLE  . SHOULDER ARTHROSCOPY WITH ROTATOR CUFF REPAIR AND SUBACROMIAL DECOMPRESSION Left 03/08/2014   Procedure: LEFT SHOULDER ARTHROSCOPY WITH ROTATOR CUFF REPAIR/SUBACROMIAL DECOMPRESSION/DISTAL CLAVICLE RESECTION;  Surgeon: Marin Shutter, MD;  Location: [place];  Service: Orthopedics;  Laterality: Left;  . SVT ABLATION N/A 01/23/2019   Procedure: SVT ABLATION;  Surgeon: Evans Lance, MD;  Location: [place] CV LAB;  Service:  Cardiovascular;  Laterality: N/A;  . TEE WITHOUT CARDIOVERSION N/A 03/21/2012   Procedure: TRANSESOPHAGEAL ECHOCARDIOGRAM (TEE);  Surgeon: Birdie Riddle, MD;  Location: [place];  Service: Cardiovascular;  Laterality: N/A;  . TEE WITHOUT CARDIOVERSION N/A 04/24/2013   Procedure: TRANSESOPHAGEAL ECHOCARDIOGRAM (TEE);  Surgeon: Dorothy Spark, MD;  Location: [place];  Service: Cardiovascular;  Laterality: N/A;  . TEE WITHOUT CARDIOVERSION N/A 07/29/2015   Procedure: TRANSESOPHAGEAL ECHOCARDIOGRAM (TEE);  Surgeon: Fay Records, MD;  Location: [facility] ENDOSCOPY;  Service: Cardiovascular;  Laterality: N/A;  . TONSILLECTOMY    . TOTAL KNEE ARTHROPLASTY Right 05/24/2018   Procedure: RIGHT TOTAL KNEE ARTHROPLASTY;  Surgeon: Paralee Cancel, MD;  [facility];  Service: Orthopedics;  Laterality: Right;  70 mins    There were no vitals filed for this visit.  Subjective Assessment - 05/18/19 1344    Subjective  Patient reports his back responded well after the last visit. He woke up more sore this morning.    Pertinent History  A-fib, anxiety, SVT, pulmonary embolisms    Limitations  Standing;Sitting    How long can you sit comfortably?  no limit    Diagnostic tests  Notjoing on his back or hips for a while    Currently in Pain?  Yes    Pain Score  4     Pain Location  Back    Pain Orientation  Right    Pain Descriptors / Indicators  Aching    Pain Type  Chronic pain    Pain Onset  More than a month ago    Pain Frequency  Intermittent    Aggravating Factors   standing and walking    Pain Relieving Factors  sitting    Effect of Pain on Daily Activities  standing and walking                        [facility] Adult PT Treatment/Exercise - 05/18/19 1343      Lumbar Exercises: Stretches   Lower Trunk Rotation Limitations  x20 reviewed for HEP     Piriformis Stretch Limitations  3x20 sec hold       Lumbar Exercises: Aerobic   Nustep  L5 5 min       Lumbar Exercises:  Standing   Other Standing Lumbar Exercises  step up x10 fwd and lateral       Lumbar Exercises: Supine   Pelvic Tilt  10 reps;10 seconds    Other Supine Lumbar Exercises  march with PPT 2x10 given for HEP     Other Supine Lumbar Exercises  Supine clamshell x20 red with abdominal bracing       Manual Therapy   Manual Therapy  Joint mobilization;Manual Traction    Manual therapy comments  passive knee extension stretching     Joint Mobilization  PA and AP glides to the  knee to improve extnesion; sidelying PA mobilization from L2-L5     Manual Traction  LAD3x30 sec hold left right 1x30 but felt pulling in the quad and knee             PT Education - 05/18/19 0939    Education Details  reviewed ther-ex technique    Person(s) Educated  Patient    Methods  Explanation    Comprehension  Verbalized understanding;Returned demonstration;Verbal cues required;Tactile cues required       PT Short Term Goals - 05/18/19 1525      PT SHORT TERM GOAL #1   Title  Patient will stand for > 7 min without increased back and hip pain    Time  3    Period  Weeks    Status  On-going    Target Date  05/08/19      PT SHORT TERM GOAL #2   Title  Patient will ambualte 300' without singlepoint cane without increased pain    Time  3    Period  Weeks    Status  On-going    Target Date  05/08/19      PT SHORT TERM GOAL #3   Title  Patient will increase bilateral LE strength to 4+/5    Time  3    Period  Days    Status  On-going    Target Date  05/08/19        PT Long Term Goals - 04/17/19 1635      PT LONG TERM GOAL #1   Title  Patient will stand for 30 minutes without increased pain in order to perfrom ADL's    Time  6    Period  Weeks    Status  New    Target Date  05/29/19      PT LONG TERM GOAL #2   Title  Patient will ambualte 1000' with LRAD without increased pain    Time  6    Period  Weeks    Status  New    Target Date  05/29/19      PT LONG TERM GOAL #3   Title   Patient will demonstrate a 54% limitation on foto to show improved function    Time  6    Period  Weeks    Status  New    Target Date  05/29/19            Plan - 05/18/19 1416    Clinical Impression Statement  Patient had a fall during treatment. Patient tried to sit on a stolla nd the stool slid. He hit his back on treatment table and had a small abrasion on his hand. Therapy inspected his back and so no signs of abrasion or bruising. He was able to stand. He requested to finish his treatment. He reported no pain after treatment. therapy educated him on signs and symptoms and potential increase in synmptoms that can be related to fall. The patient otherwise tolerated treatment well. He had less spasming today despite baseline pain. His knee extension is improving per visual inspection. With his knee extension improving his leg length descrepancy is becoming more apparent. Therapy will continue to monitor. Therapy added a step up for strengthening.    Personal Factors and Comorbidities  Comorbidity 1;Comorbidity 2;Comorbidity 3+    Comorbidities  SVT; right knee replacement; old hip fixation    Examination-Activity Limitations  Carry;Stand;Stairs;Squat;Transfers;Locomotion Level    Examination-Participation Restrictions  Community Activity;Shop  Stability/Clinical Decision Making  Evolving/Moderate complexity    Clinical Decision Making  Moderate    Rehab Potential  Good    PT Frequency  2x / week    PT Duration  6 weeks    PT Treatment/Interventions  ADLs/Self Care Home Management;Cryotherapy;Electrical Stimulation;Iontophoresis 4mg /ml Dexamethasone;Moist Heat;Ultrasound;DME Instruction;Neuromuscular re-education;Therapeutic exercise;Therapeutic activities;Patient/family education;Manual techniques;Passive range of motion;Dry needling;Taping    PT Next Visit Plan  continue core strengthening; manual therapy PRN; add standing hip abduction and extension; assess symptoms follwing fall.  Advance standing activity as tolerated.    PT Home Exercise Plan  hamstring and piriformins stretch; supine hip abduction, PPT, LTR, PPT with arm and with knee raises    Consulted and Agree with Plan of Care  Patient       Patient will benefit from skilled therapeutic intervention in order to improve the following deficits and impairments:  Abnormal gait, Decreased activity tolerance, Pain, Decreased strength, Decreased range of motion, Increased muscle spasms, Decreased endurance  Visit Diagnosis: Chronic bilateral low back pain without sciatica  Pain in left hip  Other abnormalities of gait and mobility     Problem List Patient Active Problem List   Diagnosis Date Noted  . Breast lump 03/29/2019  . SVT (supraventricular tachycardia) ([place]) 01/21/2019  . S/P right total knee arthroplasty 05/24/2018  . Insomnia due to medical condition 12/23/2016  . BPH (benign prostatic hyperplasia) 10/21/2016  . Essential hypertension 10/21/2016  . MDD (major depressive disorder) 10/21/2016  . Obesity (BMI 30-39.9) 03/07/2016  . History of alcohol abuse 03/07/2016  . History of colon polyps 11/18/2015  . Paroxysmal atrial fibrillation (H[facility])   . Restless legs syndrome 02/08/2014  . Generalized anxiety disorder 12/12/2013  . Recurrent unilateral inguinal hernia 07/17/2013  . Nonrheumatic mitral valve insufficiency 04/25/2013  . Current use of long term anticoagulation   . Obstructive sleep apnea 03/09/2013  . Chronic bilateral low back pain with sciatica 03/21/2012  . HLD (hyperlipidemia) 03/21/2012  . Arthralgia of hip 03/21/2012  . History of pulmonary embolism 03/19/2012  . Bilateral inguinal hernia 05/29/2011  . Uncomplicated asthma [id number]  . Anxiety state 07/18/2007    Carney Living PT DPT  05/18/2019, 3:27 PM  [facility] [address] [place], [place], [address] Phone: [phone]   Fax:  [phone]  Name: Brandon [name]. MRN: [id number] Date of Birth: 04-12-1950

## 2019-05-31 ENCOUNTER — Encounter: Payer: Self-pay | Admitting: Physical Therapy

## 2019-05-31 ENCOUNTER — Ambulatory Visit: Payer: Medicare Other | Admitting: Physical Therapy

## 2019-05-31 ENCOUNTER — Other Ambulatory Visit: Payer: Self-pay

## 2019-05-31 DIAGNOSIS — M25552 Pain in left hip: Secondary | ICD-10-CM

## 2019-05-31 DIAGNOSIS — R2689 Other abnormalities of gait and mobility: Secondary | ICD-10-CM

## 2019-05-31 DIAGNOSIS — G8929 Other chronic pain: Secondary | ICD-10-CM

## 2019-05-31 DIAGNOSIS — M545 Low back pain, unspecified: Secondary | ICD-10-CM

## 2019-05-31 NOTE — Therapy (Signed)
Brandon [name], [place], [address] Phone: [phone]   Fax:  [phone]  Physical Therapy Treatment  Patient Details  Name: Brandon [name]. MRN: [id number] Date of Birth: 09/23/1950 Referring Provider (PT): Dr Dorris Singh   Progress Note Reporting Period 04/17/2019 to 05/31/2019  See note below for Objective Data and Assessment of Progress/Goals.       Encounter Date: 05/31/2019  PT End of Session - 05/31/19 0859    Visit Number  8    Number of Visits  20    Date for PT Re-Evaluation  07/12/19    Authorization Type  Mcr /BCBS    PT Start Time  0845    PT Stop Time  0928    PT Time Calculation (min)  43 min    Activity Tolerance  Patient tolerated treatment well    Behavior During Therapy  [name] for tasks assessed/performed       Past Medical History:  Diagnosis Date  . Anxiety   . Arthritis   . Benign neoplasm of colon   . Benign prostatic hyperplasia with urinary obstruction   . Bilateral pulmonary embolism ([place] ) 3/14   admitted to [facility],  treated with Xarelto  . Cataract   . Chronic pain   . History of kidney stones   . History of pulmonary embolism 03/19/2012  . Hyperlipemia   . Hypertension   . Insomnia   . Major depressive disorder, recurrent episode ([place])   . Mitral regurgitation 04/24/2013   Mild by TEE  . Obesity   . OSA (obstructive sleep apnea)    noncompliant with CPAP.  07/27/13- awaiting a CPAP- unable to tolerate mask  . Osteoporosis   . Persistent atrial fibrillation ([place])   . Restless leg syndrome    takes gabapentin  . Sciatica   . SVT (supraventricular tachycardia) ([place]) 03/07/2016  . Thrombus of left atrial appendage 03/09/2013  . Ventral hernia, unspecified, without mention of obstruction or gangrene    right abdominal wall    Past Surgical History:  Procedure Laterality Date  . CARDIOVERSION N/A 03/21/2012   Procedure: CARDIOVERSION;  Surgeon: Birdie Riddle,  MD;  Location: [facility] ENDOSCOPY;  Service: Cardiovascular;  Laterality: N/A;  . CARDIOVERSION N/A 04/24/2013   Procedure: CARDIOVERSION;  Surgeon: Dorothy Spark, MD;  Location: [facility] ENDOSCOPY;  Service: Cardiovascular;  Laterality: N/A;  . CARDIOVERSION N/A 06/27/2015   Procedure: CARDIOVERSION;  Surgeon: Larey Dresser, MD;  Location: [place];  Service: Cardiovascular;  Laterality: N/A;  . CATARACT EXTRACTION    . COLONOSCOPY W/ POLYPECTOMY    . ELECTROPHYSIOLOGIC STUDY N/A 07/30/2015   Procedure: Atrial Fibrillation Ablation;  Surgeon: Thompson Grayer, MD;  Location: [place] CV LAB;  Service: Cardiovascular;  Laterality: N/A;  . EXTRACORPOREAL SHOCK WAVE LITHOTRIPSY Right 10/29/2016   Procedure: RIGHT EXTRACORPOREAL SHOCK WAVE LITHOTRIPSY (ESWL);  Surgeon: Alexis Frock, MD;  [facility];  Service: Urology;  Laterality: Right;  . EYE SURGERY Right    cataract  . FEMUR FRACTURE SURGERY    . HERNIA REPAIR  07/06/2011  . INGUINAL HERNIA REPAIR Right 07/28/2013   Procedure: RIGHT INGUINAL HERNIA REPAIR;  Surgeon: Imogene Burn. Georgette Dover, MD;  Location: [place];  Service: General;  Laterality: Right;  . INGUINAL HERNIA REPAIR Right 09/22/2016   Procedure: LAPAROSCOPIC RIGHT INGUINAL HERNIA;  Surgeon: Kinsinger, Arta Bruce, MD;  [facility];  Service: General;  Laterality: Right;  With MESH  . INSERTION OF MESH  Right 07/28/2013   Procedure: INSERTION OF MESH;  Surgeon: Imogene Burn. Georgette Dover, MD;  Location: [place];  Service: General;  Laterality: Right;  . KNEE ARTHROSCOPY     left  . REPLACEMENT TOTAL KNEE Left   . ROTATOR CUFF REPAIR     right  . ROTATOR CUFF REPAIR Left 03/08/2014   DR SUPPLE  . SHOULDER ARTHROSCOPY WITH ROTATOR CUFF REPAIR AND SUBACROMIAL DECOMPRESSION Left 03/08/2014   Procedure: LEFT SHOULDER ARTHROSCOPY WITH ROTATOR CUFF REPAIR/SUBACROMIAL DECOMPRESSION/DISTAL CLAVICLE RESECTION;  Surgeon: Marin Shutter, MD;  Location: [place];  Service: Orthopedics;  Laterality: Left;  . SVT  ABLATION N/A 01/23/2019   Procedure: SVT ABLATION;  Surgeon: Evans Lance, MD;  Location: [place] CV LAB;  Service: Cardiovascular;  Laterality: N/A;  . TEE WITHOUT CARDIOVERSION N/A 03/21/2012   Procedure: TRANSESOPHAGEAL ECHOCARDIOGRAM (TEE);  Surgeon: Birdie Riddle, MD;  Location: [place];  Service: Cardiovascular;  Laterality: N/A;  . TEE WITHOUT CARDIOVERSION N/A 04/24/2013   Procedure: TRANSESOPHAGEAL ECHOCARDIOGRAM (TEE);  Surgeon: Dorothy Spark, MD;  Location: [place];  Service: Cardiovascular;  Laterality: N/A;  . TEE WITHOUT CARDIOVERSION N/A 07/29/2015   Procedure: TRANSESOPHAGEAL ECHOCARDIOGRAM (TEE);  Surgeon: Fay Records, MD;  Location: [facility] ENDOSCOPY;  Service: Cardiovascular;  Laterality: N/A;  . TONSILLECTOMY    . TOTAL KNEE ARTHROPLASTY Right 05/24/2018   Procedure: RIGHT TOTAL KNEE ARTHROPLASTY;  Surgeon: Paralee Cancel, MD;  [facility];  Service: Orthopedics;  Laterality: Right;  70 mins    There were no vitals filed for this visit.      [facility] PT Assessment - 05/31/19 0001      Observation/Other Assessments   Focus on Therapeutic Outcomes (FOTO)   49%       Strength   Right Hip Flexion  4+/5    Right Hip ABduction  4+/5    Left Hip Flexion  4+/5    Left Hip ABduction  4+/5    Left Hip ADduction  4+/5      6 Minute Walk- Baseline   6 Minute Walk- Baseline  yes                    [facility] Adult PT Treatment/Exercise - 05/31/19 0001      Ambulation/Gait   Gait Comments  6 min walk test 605 feet with a 53 second rest break after 12 min and 2:23.       Lumbar Exercises: Stretches   Lower Trunk Rotation Limitations  x20 reviewed for HEP     Piriformis Stretch Limitations  3x20 sec hold       Lumbar Exercises: Aerobic   Nustep  L5 5 min       Lumbar Exercises: Seated   Other Seated Lumbar Exercises  seated clamshells  green 2x10 with emphasis on posture and breathing       Lumbar Exercises: Supine   Other Supine Lumbar  Exercises  march with PPT 2x10 given for HEP       Manual Therapy   Manual Therapy  Joint mobilization;Manual Traction    Manual therapy comments  passive knee extension stretching     Joint Mobilization  PA and AP glides to the knee to improve extnesion; sidelying PA mobilization from L2-L5     Manual Traction  LAD3x30 sec hold left right 1x30 but felt pulling in the quad and knee               PT Short Term Goals -  05/31/19 2126      PT SHORT TERM GOAL #1   Title  Patient will stand for > 7 min without increased back and hip pain    Baseline  can stand for about 10-15 minutes    Time  3    Period  Weeks    Status  On-going    Target Date  05/08/19      PT SHORT TERM GOAL #2   Title  Patient will ambualte 300' without singlepoint cane without increased pain    Baseline  still using cane    Time  3    Period  Weeks    Status  On-going    Target Date  05/08/19      PT SHORT TERM GOAL #3   Title  Patient will increase bilateral LE strength to 4+/5    Time  3    Period  Weeks    Status  On-going    Target Date  05/08/19        PT Long Term Goals - 05/31/19 2129      PT LONG TERM GOAL #1   Title  Patient will stand for 30 minutes without increased pain in order to perfrom ADL's    Baseline  10 minutes    Time  6    Period  Weeks    Status  On-going      PT LONG TERM GOAL #2   Title  Patient will ambualte 1000' with LRAD without increased pain    Baseline  may continues to need a cane    Time  6    Period  Weeks    Status  On-going            Plan - 05/31/19 [id number]    Clinical Impression Statement  The patient is making progress. He is walking straighter and reports improvement in ability to perfrom ADL's. His FOTO outcome meuasure has improved signficantly to a 49% limitation from a 75% limitation. His main goal is to be able to walk a 1/4 mile. Unfourtunely he dosnet feel like he has mad much progress towards that goal. He perfromed a 6 minute walk  test today so we can more acuretly track his gait distance. Therapy will continue to progress as tolerated.He would benefit from further skilled therapy 2W6    Personal Factors and Comorbidities  Comorbidity 1;Comorbidity 2;Comorbidity 3+    Comorbidities  SVT; right knee replacement; old hip fixation    Examination-Activity Limitations  Carry;Stand;Stairs;Squat;Transfers;Locomotion Level    Examination-Participation Restrictions  Community Activity;Shop    Stability/Clinical Decision Making  Evolving/Moderate complexity    Clinical Decision Making  Moderate    Rehab Potential  Good    PT Frequency  2x / week    PT Duration  6 weeks    PT Treatment/Interventions  ADLs/Self Care Home Management;Cryotherapy;Electrical Stimulation;Iontophoresis 4mg /ml Dexamethasone;Moist Heat;Ultrasound;DME Instruction;Neuromuscular re-education;Therapeutic exercise;Therapeutic activities;Patient/family education;Manual techniques;Passive range of motion;Dry needling;Taping    PT Next Visit Plan  continue core strengthening; manual therapy PRN; add standing hip abduction and extension; assess symptoms follwing fall. Advance standing activity as tolerated.    PT Home Exercise Plan  hamstring and piriformins stretch; supine hip abduction, PPT, LTR, PPT with arm and with knee raises    Consulted and Agree with Plan of Care  Patient       Patient will benefit from skilled therapeutic intervention in order to improve the following deficits and impairments:  Abnormal gait, Decreased activity tolerance, Pain, Decreased strength,  Decreased range of motion, Increased muscle spasms, Decreased endurance  Visit Diagnosis: Chronic bilateral low back pain without sciatica  Pain in left hip  Other abnormalities of gait and mobility     Problem List Patient Active Problem List   Diagnosis Date Noted  . Breast lump 03/29/2019  . SVT (supraventricular tachycardia) ([place]) 01/21/2019  . S/P right total knee arthroplasty  05/24/2018  . Insomnia due to medical condition 12/23/2016  . BPH (benign prostatic hyperplasia) 10/21/2016  . Essential hypertension 10/21/2016  . MDD (major depressive disorder) 10/21/2016  . Obesity (BMI 30-39.9) 03/07/2016  . History of alcohol abuse 03/07/2016  . History of colon polyps 11/18/2015  . Paroxysmal atrial fibrillation (HCC)   . Restless legs syndrome 02/08/2014  . Generalized anxiety disorder 12/12/2013  . Recurrent unilateral inguinal hernia 07/17/2013  . Nonrheumatic mitral valve insufficiency 04/25/2013  . Current use of long term anticoagulation   . Obstructive sleep apnea 03/09/2013  . Chronic bilateral low back pain with sciatica 03/21/2012  . HLD (hyperlipidemia) 03/21/2012  . Arthralgia of hip 03/21/2012  . History of pulmonary embolism 03/19/2012  . Bilateral inguinal hernia 05/29/2011  . Uncomplicated asthma [id number]  . Anxiety state 07/18/2007    Carney Living 05/31/2019, 9:31 PM  [facility] [address] [place], [place], [address] Phone: [phone]   Fax:  [phone]  Name: Bishop Hasbrook. MRN: [id number] Date of Birth: 26-Nov-1950

## 2019-06-02 ENCOUNTER — Ambulatory Visit: Payer: Medicare Other | Admitting: Physical Therapy

## 2019-06-02 ENCOUNTER — Encounter: Payer: Self-pay | Admitting: Physical Therapy

## 2019-06-02 ENCOUNTER — Other Ambulatory Visit: Payer: Self-pay

## 2019-06-02 DIAGNOSIS — M545 Low back pain, unspecified: Secondary | ICD-10-CM

## 2019-06-02 DIAGNOSIS — G8929 Other chronic pain: Secondary | ICD-10-CM

## 2019-06-02 DIAGNOSIS — M25552 Pain in left hip: Secondary | ICD-10-CM

## 2019-06-02 DIAGNOSIS — R2689 Other abnormalities of gait and mobility: Secondary | ICD-10-CM | POA: Diagnosis not present

## 2019-06-02 NOTE — Therapy (Signed)
[place], [place], [address] Phone: [phone]   Fax:  [phone]  Physical Therapy Treatment  Patient Details  Name: Brandon [name]. MRN: [id number] Date of Birth: 06-Dec-1950 Referring Provider (PT): Dr Dorris Singh    Encounter Date: 06/02/2019  PT End of Session - 06/02/19 1052    Visit Number  9    Number of Visits  20    Date for PT Re-Evaluation  07/12/19    Authorization Type  Mcr /BCBS    PT Start Time  [id number]    PT Stop Time  1100    PT Time Calculation (min)  45 min    Activity Tolerance  Patient tolerated treatment well    Behavior During Therapy  [facility] for tasks assessed/performed       Past Medical History:  Diagnosis Date  . Anxiety   . Arthritis   . Benign neoplasm of colon   . Benign prostatic hyperplasia with urinary obstruction   . Bilateral pulmonary embolism ([place]) 3/14   admitted to [facility],  treated with Xarelto  . Cataract   . Chronic pain   . History of kidney stones   . History of pulmonary embolism 03/19/2012  . Hyperlipemia   . Hypertension   . Insomnia   . Major depressive disorder, recurrent episode ([place])   . Mitral regurgitation 04/24/2013   Mild by TEE  . Obesity   . OSA (obstructive sleep apnea)    noncompliant with CPAP.  07/27/13- awaiting a CPAP- unable to tolerate mask  . Osteoporosis   . Persistent atrial fibrillation ([place])   . Restless leg syndrome    takes gabapentin  . Sciatica   . SVT (supraventricular tachycardia) ([place]) 03/07/2016  . Thrombus of left atrial appendage 03/09/2013  . Ventral hernia, unspecified, without mention of obstruction or gangrene    right abdominal wall    Past Surgical History:  Procedure Laterality Date  . CARDIOVERSION N/A 03/21/2012   Procedure: CARDIOVERSION;  Surgeon: Birdie Riddle, MD;  Location: [facility] ENDOSCOPY;  Service: Cardiovascular;  Laterality: N/A;  . CARDIOVERSION N/A 04/24/2013   Procedure: CARDIOVERSION;   Surgeon: Dorothy Spark, MD;  Location: [facility] ENDOSCOPY;  Service: Cardiovascular;  Laterality: N/A;  . CARDIOVERSION N/A 06/27/2015   Procedure: CARDIOVERSION;  Surgeon: Larey Dresser, MD;  Location: [facility];  Service: Cardiovascular;  Laterality: N/A;  . CATARACT EXTRACTION    . COLONOSCOPY W/ POLYPECTOMY    . ELECTROPHYSIOLOGIC STUDY N/A 07/30/2015   Procedure: Atrial Fibrillation Ablation;  Surgeon: Thompson Grayer, MD;  Location: [place] CV LAB;  Service: Cardiovascular;  Laterality: N/A;  . EXTRACORPOREAL SHOCK WAVE LITHOTRIPSY Right 10/29/2016   Procedure: RIGHT EXTRACORPOREAL SHOCK WAVE LITHOTRIPSY (ESWL);  Surgeon: Alexis Frock, MD;  [facility];  Service: Urology;  Laterality: Right;  . EYE SURGERY Right    cataract  . FEMUR FRACTURE SURGERY    . HERNIA REPAIR  07/06/2011  . INGUINAL HERNIA REPAIR Right 07/28/2013   Procedure: RIGHT INGUINAL HERNIA REPAIR;  Surgeon: Imogene Burn. Georgette Dover, MD;  Location: [place];  Service: General;  Laterality: Right;  . INGUINAL HERNIA REPAIR Right 09/22/2016   Procedure: LAPAROSCOPIC RIGHT INGUINAL HERNIA;  Surgeon: Kinsinger, Arta Bruce, MD;  [facility];  Service: General;  Laterality: Right;  With MESH  . INSERTION OF MESH Right 07/28/2013   Procedure: INSERTION OF MESH;  Surgeon: Imogene Burn. Georgette Dover, MD;  Location: [place];  Service: General;  Laterality:  Right;  Marland Kitchen KNEE ARTHROSCOPY     left  . REPLACEMENT TOTAL KNEE Left   . ROTATOR CUFF REPAIR     right  . ROTATOR CUFF REPAIR Left 03/08/2014   DR SUPPLE  . SHOULDER ARTHROSCOPY WITH ROTATOR CUFF REPAIR AND SUBACROMIAL DECOMPRESSION Left 03/08/2014   Procedure: LEFT SHOULDER ARTHROSCOPY WITH ROTATOR CUFF REPAIR/SUBACROMIAL DECOMPRESSION/DISTAL CLAVICLE RESECTION;  Surgeon: Marin Shutter, MD;  Location: [place];  Service: Orthopedics;  Laterality: Left;  . SVT ABLATION N/A 01/23/2019   Procedure: SVT ABLATION;  Surgeon: Evans Lance, MD;  Location: [place] CV LAB;  Service:  Cardiovascular;  Laterality: N/A;  . TEE WITHOUT CARDIOVERSION N/A 03/21/2012   Procedure: TRANSESOPHAGEAL ECHOCARDIOGRAM (TEE);  Surgeon: Birdie Riddle, MD;  Location: [place];  Service: Cardiovascular;  Laterality: N/A;  . TEE WITHOUT CARDIOVERSION N/A 04/24/2013   Procedure: TRANSESOPHAGEAL ECHOCARDIOGRAM (TEE);  Surgeon: Dorothy Spark, MD;  Location: [place];  Service: Cardiovascular;  Laterality: N/A;  . TEE WITHOUT CARDIOVERSION N/A 07/29/2015   Procedure: TRANSESOPHAGEAL ECHOCARDIOGRAM (TEE);  Surgeon: Fay Records, MD;  Location: [facility] ENDOSCOPY;  Service: Cardiovascular;  Laterality: N/A;  . TONSILLECTOMY    . TOTAL KNEE ARTHROPLASTY Right 05/24/2018   Procedure: RIGHT TOTAL KNEE ARTHROPLASTY;  Surgeon: Paralee Cancel, MD;  [facility];  Service: Orthopedics;  Laterality: Right;  70 mins    There were no vitals filed for this visit.  Subjective Assessment - 06/02/19 1029    Subjective  Patient back is a little tight and sore this morning. Yesterday he was doing some stand and putting things in cabinets for a long period of time.    Pertinent History  A-fib, anxiety, SVT, pulmonary embolisms    Limitations  Standing;Sitting    How long can you sit comfortably?  no limit    Diagnostic tests  Notjoing on his back or hips for a while    Currently in Pain?  Yes    Pain Score  7     Pain Location  Back    Pain Orientation  Right;Left    Pain Descriptors / Indicators  Aching    Pain Type  Chronic pain    Pain Onset  More than a month ago                        [facility] Adult PT Treatment/Exercise - 06/02/19 0001      Self-Care   Self-Care  Other Self-Care Comments    Other Self-Care Comments   Patient was interested in what is going in his back       Lumbar Exercises: Stretches   Piriformis Stretch Limitations  3x20 sec hold       Lumbar Exercises: Aerobic   Nustep  L5 5 min       Lumbar Exercises: Standing   Other Standing Lumbar Exercises   pallof press x20 red each direction     Other Standing Lumbar Exercises  Shoulder extnesion red x20 with abdominal breathing Scap retraction x20 with breathing      Lumbar Exercises: Seated   Other Seated Lumbar Exercises  seated clamshells  green 2x10       Lumbar Exercises: Supine   Pelvic Tilt  10 reps;10 seconds      Manual Therapy   Manual Therapy  Joint mobilization;Manual Traction    Manual therapy comments  passive knee extension stretching     Joint Mobilization  PA and AP glides to the  knee to improve extnesion; sidelying PA mobilization from L2-L5     Manual Traction  LAD3x30 sec hold left right 1x30 but felt pulling in the quad and knee             PT Education - 06/02/19 1051    Education Details  reviewed standing band exercises    Person(s) Educated  Patient    Methods  Explanation;Demonstration;Tactile cues;Verbal cues    Comprehension  Verbalized understanding;Returned demonstration;Verbal cues required;Tactile cues required       PT Short Term Goals - 05/31/19 2126      PT SHORT TERM GOAL #1   Title  Patient will stand for > 7 min without increased back and hip pain    Baseline  can stand for about 10-15 minutes    Time  3    Period  Weeks    Status  On-going    Target Date  05/08/19      PT SHORT TERM GOAL #2   Title  Patient will ambualte 300' without singlepoint cane without increased pain    Baseline  still using cane    Time  3    Period  Weeks    Status  On-going    Target Date  05/08/19      PT SHORT TERM GOAL #3   Title  Patient will increase bilateral LE strength to 4+/5    Time  3    Period  Weeks    Status  On-going    Target Date  05/08/19        PT Long Term Goals - 05/31/19 2129      PT LONG TERM GOAL #1   Title  Patient will stand for 30 minutes without increased pain in order to perfrom ADL's    Baseline  10 minutes    Time  6    Period  Weeks    Status  On-going      PT LONG TERM GOAL #2   Title  Patient will  ambualte 1000' with LRAD without increased pain    Baseline  may continues to need a cane    Time  6    Period  Weeks    Status  On-going            Plan - 06/02/19 1052    Clinical Impression Statement  Depsite some intal soreness from his activity yesterday, therapy was able to advance his ther-ex. He was given a pallof press and a chop. He had no significant increase in pain. he continues to be very motivated and has high level goals he would like to achieve.    Personal Factors and Comorbidities  Comorbidity 1;Comorbidity 2;Comorbidity 3+    Comorbidities  SVT; right knee replacement; old hip fixation    Examination-Activity Limitations  Carry;Stand;Stairs;Squat;Transfers;Locomotion Level    Examination-Participation Restrictions  Community Activity;Shop    Stability/Clinical Decision Making  Evolving/Moderate complexity    Clinical Decision Making  Moderate    Rehab Potential  Good    PT Frequency  2x / week    PT Treatment/Interventions  ADLs/Self Care Home Management;Cryotherapy;Electrical Stimulation;Iontophoresis 4mg /ml Dexamethasone;Moist Heat;Ultrasound;DME Instruction;Neuromuscular re-education;Therapeutic exercise;Therapeutic activities;Patient/family education;Manual techniques;Passive range of motion;Dry needling;Taping    PT Next Visit Plan  continue core strengthening; manual therapy PRN; add standing hip abduction and extension; assess symptoms follwing fall. Advance standing activity as tolerated.    PT Home Exercise Plan  hamstring and piriformins stretch; supine hip abduction, PPT, LTR, PPT with arm and  with knee raises    Consulted and Agree with Plan of Care  Patient       Patient will benefit from skilled therapeutic intervention in order to improve the following deficits and impairments:  Abnormal gait, Decreased activity tolerance, Pain, Decreased strength, Decreased range of motion, Increased muscle spasms, Decreased endurance  Visit Diagnosis: Chronic  bilateral low back pain without sciatica  Pain in left hip  Other abnormalities of gait and mobility     Problem List Patient Active Problem List   Diagnosis Date Noted  . Breast lump 03/29/2019  . SVT (supraventricular tachycardia) ([place]) 01/21/2019  . S/P right total knee arthroplasty 05/24/2018  . Insomnia due to medical condition 12/23/2016  . BPH (benign prostatic hyperplasia) 10/21/2016  . Essential hypertension 10/21/2016  . MDD (major depressive disorder) 10/21/2016  . Obesity (BMI 30-39.9) 03/07/2016  . History of alcohol abuse 03/07/2016  . History of colon polyps 11/18/2015  . Paroxysmal atrial fibrillation ([facility])   . Restless legs syndrome 02/08/2014  . Generalized anxiety disorder 12/12/2013  . Recurrent unilateral inguinal hernia 07/17/2013  . Nonrheumatic mitral valve insufficiency 04/25/2013  . Current use of long term anticoagulation   . Obstructive sleep apnea 03/09/2013  . Chronic bilateral low back pain with sciatica 03/21/2012  . HLD (hyperlipidemia) 03/21/2012  . Arthralgia of hip 03/21/2012  . History of pulmonary embolism 03/19/2012  . Bilateral inguinal hernia 05/29/2011  . Uncomplicated asthma [id number]  . Anxiety state 07/18/2007    Carney Living  PT DPT  06/02/2019, 1:30 PM  [facility] [address] [place], [place], [address] Phone: [phone]   Fax:  [phone]  Name: Brandon [name]. MRN: [id number] Date of Birth: July 13, 1950

## 2019-06-06 ENCOUNTER — Other Ambulatory Visit: Payer: Self-pay

## 2019-06-06 ENCOUNTER — Ambulatory Visit: Payer: Medicare Other | Attending: Family Medicine | Admitting: Physical Therapy

## 2019-06-06 ENCOUNTER — Encounter: Payer: Self-pay | Admitting: Physical Therapy

## 2019-06-06 DIAGNOSIS — R2689 Other abnormalities of gait and mobility: Secondary | ICD-10-CM | POA: Insufficient documentation

## 2019-06-06 DIAGNOSIS — M545 Low back pain, unspecified: Secondary | ICD-10-CM

## 2019-06-06 DIAGNOSIS — M25552 Pain in left hip: Secondary | ICD-10-CM | POA: Insufficient documentation

## 2019-06-06 DIAGNOSIS — G8929 Other chronic pain: Secondary | ICD-10-CM | POA: Insufficient documentation

## 2019-06-06 NOTE — Therapy (Signed)
[place], [place], [address] Phone: [phone]   Fax:  [phone]  Physical Therapy Treatment  Patient Details  Name: Brandon [name]. MRN: [id number] Date of Birth: 07-31-1950 Referring Provider (PT): Dr Dorris Singh    Encounter Date: 06/06/2019  PT End of Session - 06/06/19 1415    Visit Number  10    Number of Visits  20    Date for PT Re-Evaluation  07/12/19    Authorization Type  Mcr /BCBS progress not e on visit 18    PT Start Time  1015    PT Stop Time  1058    PT Time Calculation (min)  43 min    Activity Tolerance  Patient tolerated treatment well    Behavior During Therapy  WFL for tasks assessed/performed       Past Medical History:  Diagnosis Date  . Anxiety   . Arthritis   . Benign neoplasm of colon   . Benign prostatic hyperplasia with urinary obstruction   . Bilateral pulmonary embolism ([place]) 3/14   admitted to [facility],  treated with Xarelto  . Cataract   . Chronic pain   . History of kidney stones   . History of pulmonary embolism 03/19/2012  . Hyperlipemia   . Hypertension   . Insomnia   . Major depressive disorder, recurrent episode ([place])   . Mitral regurgitation 04/24/2013   Mild by TEE  . Obesity   . OSA (obstructive sleep apnea)    noncompliant with CPAP.  07/27/13- awaiting a CPAP- unable to tolerate mask  . Osteoporosis   . Persistent atrial fibrillation ([place])   . Restless leg syndrome    takes gabapentin  . Sciatica   . SVT (supraventricular tachycardia) ([place]) 03/07/2016  . Thrombus of left atrial appendage 03/09/2013  . Ventral hernia, unspecified, without mention of obstruction or gangrene    right abdominal wall    Past Surgical History:  Procedure Laterality Date  . CARDIOVERSION N/A 03/21/2012   Procedure: CARDIOVERSION;  Surgeon: Birdie Riddle, MD;  Location: [facility] ENDOSCOPY;  Service: Cardiovascular;  Laterality: N/A;  . CARDIOVERSION N/A 04/24/2013    Procedure: CARDIOVERSION;  Surgeon: Dorothy Spark, MD;  Location: [facility] ENDOSCOPY;  Service: Cardiovascular;  Laterality: N/A;  . CARDIOVERSION N/A 06/27/2015   Procedure: CARDIOVERSION;  Surgeon: Larey Dresser, MD;  Location: [place];  Service: Cardiovascular;  Laterality: N/A;  . CATARACT EXTRACTION    . COLONOSCOPY W/ POLYPECTOMY    . ELECTROPHYSIOLOGIC STUDY N/A 07/30/2015   Procedure: Atrial Fibrillation Ablation;  Surgeon: Thompson Grayer, MD;  Location: [place] CV LAB;  Service: Cardiovascular;  Laterality: N/A;  . EXTRACORPOREAL SHOCK WAVE LITHOTRIPSY Right 10/29/2016   Procedure: RIGHT EXTRACORPOREAL SHOCK WAVE LITHOTRIPSY (ESWL);  Surgeon: Alexis Frock, MD;  [facility];  Service: Urology;  Laterality: Right;  . EYE SURGERY Right    cataract  . FEMUR FRACTURE SURGERY    . HERNIA REPAIR  07/06/2011  . INGUINAL HERNIA REPAIR Right 07/28/2013   Procedure: RIGHT INGUINAL HERNIA REPAIR;  Surgeon: Imogene Burn. Georgette Dover, MD;  Location: [place];  Service: General;  Laterality: Right;  . INGUINAL HERNIA REPAIR Right 09/22/2016   Procedure: LAPAROSCOPIC RIGHT INGUINAL HERNIA;  Surgeon: Kinsinger, Arta Bruce, MD;  [facility];  Service: General;  Laterality: Right;  With MESH  . INSERTION OF MESH Right 07/28/2013   Procedure: INSERTION OF MESH;  Surgeon: Imogene Burn. Georgette Dover, MD;  Location: [place]  [facility];  Service: General;  Laterality: Right;  . KNEE ARTHROSCOPY     left  . REPLACEMENT TOTAL KNEE Left   . ROTATOR CUFF REPAIR     right  . ROTATOR CUFF REPAIR Left 03/08/2014   DR SUPPLE  . SHOULDER ARTHROSCOPY WITH ROTATOR CUFF REPAIR AND SUBACROMIAL DECOMPRESSION Left 03/08/2014   Procedure: LEFT SHOULDER ARTHROSCOPY WITH ROTATOR CUFF REPAIR/SUBACROMIAL DECOMPRESSION/DISTAL CLAVICLE RESECTION;  Surgeon: Marin Shutter, MD;  Location: [place];  Service: Orthopedics;  Laterality: Left;  . SVT ABLATION N/A 01/23/2019   Procedure: SVT ABLATION;  Surgeon: Evans Lance, MD;  Location: [place] CV  LAB;  Service: Cardiovascular;  Laterality: N/A;  . TEE WITHOUT CARDIOVERSION N/A 03/21/2012   Procedure: TRANSESOPHAGEAL ECHOCARDIOGRAM (TEE);  Surgeon: Birdie Riddle, MD;  Location: [place];  Service: Cardiovascular;  Laterality: N/A;  . TEE WITHOUT CARDIOVERSION N/A 04/24/2013   Procedure: TRANSESOPHAGEAL ECHOCARDIOGRAM (TEE);  Surgeon: Dorothy Spark, MD;  Location: [place];  Service: Cardiovascular;  Laterality: N/A;  . TEE WITHOUT CARDIOVERSION N/A 07/29/2015   Procedure: TRANSESOPHAGEAL ECHOCARDIOGRAM (TEE);  Surgeon: Fay Records, MD;  Location: [facility] ENDOSCOPY;  Service: Cardiovascular;  Laterality: N/A;  . TONSILLECTOMY    . TOTAL KNEE ARTHROPLASTY Right 05/24/2018   Procedure: RIGHT TOTAL KNEE ARTHROPLASTY;  Surgeon: Paralee Cancel, MD;  [facility];  Service: Orthopedics;  Laterality: Right;  70 mins    There were no vitals filed for this visit.  Subjective Assessment - 06/06/19 1021    Subjective  Patient has not been using his cane much this weekend. he has had some groin pain sicne the last visit but is overall doing well. he came to the clinic today without a cane.    Pertinent History  A-fib, anxiety, SVT, pulmonary embolisms    Limitations  Standing;Sitting    How long can you sit comfortably?  no limit    Diagnostic tests  Notjoing on his back or hips for a while    Currently in Pain?  Yes    Pain Score  5     Pain Location  Back    Pain Orientation  Right    Pain Descriptors / Indicators  Aching    Pain Type  Chronic pain    Pain Onset  More than a month ago    Pain Frequency  Intermittent    Aggravating Factors   standing and walking    Pain Relieving Factors  sitting    Effect of Pain on Daily Activities  standing and walkiung    Multiple Pain Sites  No                        [facility] Adult PT Treatment/Exercise - 06/06/19 0001      Lumbar Exercises: Stretches   Lower Trunk Rotation Limitations  x20 reviewed for HEP     Piriformis  Stretch Limitations  3x20 sec hold       Lumbar Exercises: Aerobic   Nustep  L5 5 min       Lumbar Exercises: Standing   Other Standing Lumbar Exercises  pallof press x20 red each direction     Other Standing Lumbar Exercises  Shoulder extnesion red x20 with abdominal breathing Scap retraction x20 with breathing      Manual Therapy   Manual Therapy  Joint mobilization;Manual Traction    Manual therapy comments  passive knee extension stretching     Joint Mobilization  PA and AP  glides to the knee to improve extnesion; sidelying PA mobilization from L2-L5     Manual Traction  LAD3x30 sec hold left right 1x30 but felt pulling in the quad and knee             PT Education - 06/06/19 1025    Education Details  HEP and symptom management    Person(s) Educated  Patient    Methods  Explanation;Demonstration;Tactile cues    Comprehension  Verbalized understanding;Returned demonstration;Verbal cues required;Tactile cues required       PT Short Term Goals - 05/31/19 2126      PT SHORT TERM GOAL #1   Title  Patient will stand for > 7 min without increased back and hip pain    Baseline  can stand for about 10-15 minutes    Time  3    Period  Weeks    Status  On-going    Target Date  05/08/19      PT SHORT TERM GOAL #2   Title  Patient will ambualte 300' without singlepoint cane without increased pain    Baseline  still using cane    Time  3    Period  Weeks    Status  On-going    Target Date  05/08/19      PT SHORT TERM GOAL #3   Title  Patient will increase bilateral LE strength to 4+/5    Time  3    Period  Weeks    Status  On-going    Target Date  05/08/19        PT Long Term Goals - 05/31/19 2129      PT LONG TERM GOAL #1   Title  Patient will stand for 30 minutes without increased pain in order to perfrom ADL's    Baseline  10 minutes    Time  6    Period  Weeks    Status  On-going      PT LONG TERM GOAL #2   Title  Patient will ambualte 1000' with LRAD  without increased pain    Baseline  may continues to need a cane    Time  6    Period  Weeks    Status  On-going            Plan - 06/06/19 1413    Clinical Impression Statement  Patient had very little spasming in his back and hip today. He was able to tolerate standing ther-ex well. He has no been using his cane as much. Therapy will continue to advance standing activity as able. He had some groin pain over the last few days but had none today.    Personal Factors and Comorbidities  Comorbidity 1;Comorbidity 2;Comorbidity 3+    Comorbidities  SVT; right knee replacement; old hip fixation    Examination-Activity Limitations  Carry;Stand;Stairs;Squat;Transfers;Locomotion Level    Examination-Participation Restrictions  Community Activity;Shop    Stability/Clinical Decision Making  Evolving/Moderate complexity    Clinical Decision Making  Moderate    Rehab Potential  Good    PT Frequency  2x / week    PT Duration  6 weeks    PT Treatment/Interventions  ADLs/Self Care Home Management;Cryotherapy;Electrical Stimulation;Iontophoresis 4mg /ml Dexamethasone;Moist Heat;Ultrasound;DME Instruction;Neuromuscular re-education;Therapeutic exercise;Therapeutic activities;Patient/family education;Manual techniques;Passive range of motion;Dry needling;Taping    PT Next Visit Plan  continue core strengthening; manual therapy PRN; add standing hip abduction and extension; assess symptoms follwing fall. Advance standing activity as tolerated.    PT Home Exercise Plan  hamstring and piriformins stretch; supine hip abduction, PPT, LTR, PPT with arm and with knee raises    Consulted and Agree with Plan of Care  Patient       Patient will benefit from skilled therapeutic intervention in order to improve the following deficits and impairments:  Abnormal gait, Decreased activity tolerance, Pain, Decreased strength, Decreased range of motion, Increased muscle spasms, Decreased endurance  Visit  Diagnosis: Chronic bilateral low back pain without sciatica  Pain in left hip  Other abnormalities of gait and mobility     Problem List Patient Active Problem List   Diagnosis Date Noted  . Breast lump 03/29/2019  . SVT (supraventricular tachycardia) ([place]) 01/21/2019  . S/P right total knee arthroplasty 05/24/2018  . Insomnia due to medical condition 12/23/2016  . BPH (benign prostatic hyperplasia) 10/21/2016  . Essential hypertension 10/21/2016  . MDD (major depressive disorder) 10/21/2016  . Obesity (BMI 30-39.9) 03/07/2016  . History of alcohol abuse 03/07/2016  . History of colon polyps 11/18/2015  . Paroxysmal atrial fibrillation (H[facility])   . Restless legs syndrome 02/08/2014  . Generalized anxiety disorder 12/12/2013  . Recurrent unilateral inguinal hernia 07/17/2013  . Nonrheumatic mitral valve insufficiency 04/25/2013  . Current use of long term anticoagulation   . Obstructive sleep apnea 03/09/2013  . Chronic bilateral low back pain with sciatica 03/21/2012  . HLD (hyperlipidemia) 03/21/2012  . Arthralgia of hip 03/21/2012  . History of pulmonary embolism 03/19/2012  . Bilateral inguinal hernia 05/29/2011  . Uncomplicated asthma [id number]  . Anxiety state 07/18/2007    Carney Living PT DPT  06/06/2019, 2:19 PM  [facility] [address] [place], [place], [address] Phone: [phone]   Fax:  [phone]  Name: Brandon [name]. MRN: [id number] Date of Birth: 25-Apr-1950

## 2019-06-08 ENCOUNTER — Other Ambulatory Visit: Payer: Self-pay

## 2019-06-08 ENCOUNTER — Encounter: Payer: Self-pay | Admitting: Physical Therapy

## 2019-06-08 ENCOUNTER — Ambulatory Visit: Payer: Medicare Other | Admitting: Physical Therapy

## 2019-06-08 DIAGNOSIS — G8929 Other chronic pain: Secondary | ICD-10-CM

## 2019-06-08 DIAGNOSIS — R2689 Other abnormalities of gait and mobility: Secondary | ICD-10-CM

## 2019-06-08 DIAGNOSIS — M545 Low back pain, unspecified: Secondary | ICD-10-CM

## 2019-06-08 DIAGNOSIS — M25552 Pain in left hip: Secondary | ICD-10-CM

## 2019-06-08 NOTE — Therapy (Signed)
[place], [place], [address] Phone: [phone]   Fax:  [phone]  Physical Therapy Treatment  Patient Details  Name: Brandon [name]. MRN: [id number] Date of Birth: Oct 02, 1950 Referring Provider (PT): Dr Dorris Singh    Encounter Date: 06/08/2019  PT End of Session - 06/08/19 1545    Visit Number  11    Number of Visits  20    Date for PT Re-Evaluation  07/12/19    Authorization Type  Mcr /BCBS progress not e on visit 18    PT Start Time  0930    PT Stop Time  1012    PT Time Calculation (min)  42 min    Activity Tolerance  Patient tolerated treatment well    Behavior During Therapy  WFL for tasks assessed/performed       Past Medical History:  Diagnosis Date  . Anxiety   . Arthritis   . Benign neoplasm of colon   . Benign prostatic hyperplasia with urinary obstruction   . Bilateral pulmonary embolism ([place]) 3/14   admitted to [facility],  treated with Xarelto  . Cataract   . Chronic pain   . History of kidney stones   . History of pulmonary embolism 03/19/2012  . Hyperlipemia   . Hypertension   . Insomnia   . Major depressive disorder, recurrent episode ([place])   . Mitral regurgitation 04/24/2013   Mild by TEE  . Obesity   . OSA (obstructive sleep apnea)    noncompliant with CPAP.  07/27/13- awaiting a CPAP- unable to tolerate mask  . Osteoporosis   . Persistent atrial fibrillation ([place])   . Restless leg syndrome    takes gabapentin  . Sciatica   . SVT (supraventricular tachycardia) ([place]) 03/07/2016  . Thrombus of left atrial appendage 03/09/2013  . Ventral hernia, unspecified, without mention of obstruction or gangrene    right abdominal wall    Past Surgical History:  Procedure Laterality Date  . CARDIOVERSION N/A 03/21/2012   Procedure: CARDIOVERSION;  Surgeon: Birdie Riddle, MD;  Location: [facility] ENDOSCOPY;  Service: Cardiovascular;  Laterality: N/A;  . CARDIOVERSION N/A 04/24/2013   Procedure: CARDIOVERSION;  Surgeon: Dorothy Spark, MD;  Location: [facility] ENDOSCOPY;  Service: Cardiovascular;  Laterality: N/A;  . CARDIOVERSION N/A 06/27/2015   Procedure: CARDIOVERSION;  Surgeon: Larey Dresser, MD;  Location: [place];  Service: Cardiovascular;  Laterality: N/A;  . CATARACT EXTRACTION    . COLONOSCOPY W/ POLYPECTOMY    . ELECTROPHYSIOLOGIC STUDY N/A 07/30/2015   Procedure: Atrial Fibrillation Ablation;  Surgeon: Thompson Grayer, MD;  Location: [place] CV LAB;  Service: Cardiovascular;  Laterality: N/A;  . EXTRACORPOREAL SHOCK WAVE LITHOTRIPSY Right 10/29/2016   Procedure: RIGHT EXTRACORPOREAL SHOCK WAVE LITHOTRIPSY (ESWL);  Surgeon: Alexis Frock, MD;  [facility]: [facility];  Service: Urology;  Laterality: Right;  . EYE SURGERY Right    cataract  . FEMUR FRACTURE SURGERY    . HERNIA REPAIR  07/06/2011  . INGUINAL HERNIA REPAIR Right 07/28/2013   Procedure: RIGHT INGUINAL HERNIA REPAIR;  Surgeon: Imogene Burn. Georgette [place], MD;  Location: [place];  Service: General;  Laterality: Right;  . INGUINAL HERNIA REPAIR Right 09/22/2016   Procedure: LAPAROSCOPIC RIGHT INGUINAL HERNIA;  Surgeon: Kinsinger, Arta Bruce, MD;  [facility]: [facility];  Service: General;  Laterality: Right;  With MESH  . INSERTION OF MESH Right 07/28/2013   Procedure: INSERTION OF MESH;  Surgeon: Imogene Burn. Georgette [place], MD;  Location: [place];  Service: General;  Laterality: Right;  . KNEE ARTHROSCOPY     left  . REPLACEMENT TOTAL KNEE Left   . ROTATOR CUFF REPAIR     right  . ROTATOR CUFF REPAIR Left 03/08/2014   DR SUPPLE  . SHOULDER ARTHROSCOPY WITH ROTATOR CUFF REPAIR AND SUBACROMIAL DECOMPRESSION Left 03/08/2014   Procedure: LEFT SHOULDER ARTHROSCOPY WITH ROTATOR CUFF REPAIR/SUBACROMIAL DECOMPRESSION/DISTAL CLAVICLE RESECTION;  Surgeon: Marin Shutter, MD;  Location: [place];  Service: Orthopedics;  Laterality: Left;  . SVT ABLATION N/A 01/23/2019   Procedure: SVT ABLATION;  Surgeon: Evans Lance, MD;  Location: [place] CV  LAB;  Service: Cardiovascular;  Laterality: N/A;  . TEE WITHOUT CARDIOVERSION N/A 03/21/2012   Procedure: TRANSESOPHAGEAL ECHOCARDIOGRAM (TEE);  Surgeon: Birdie Riddle, MD;  Location: [facility];  Service: Cardiovascular;  Laterality: N/A;  . TEE WITHOUT CARDIOVERSION N/A 04/24/2013   Procedure: TRANSESOPHAGEAL ECHOCARDIOGRAM (TEE);  Surgeon: Dorothy Spark, MD;  Location: [place];  Service: Cardiovascular;  Laterality: N/A;  . TEE WITHOUT CARDIOVERSION N/A 07/29/2015   Procedure: TRANSESOPHAGEAL ECHOCARDIOGRAM (TEE);  Surgeon: Fay Records, MD;  Location: [facility] ENDOSCOPY;  Service: Cardiovascular;  Laterality: N/A;  . TONSILLECTOMY    . TOTAL KNEE ARTHROPLASTY Right 05/24/2018   Procedure: RIGHT TOTAL KNEE ARTHROPLASTY;  Surgeon: Paralee Cancel, MD;  [facility];  Service: Orthopedics;  Laterality: Right;  70 mins    There were no vitals filed for this visit.  Subjective Assessment - 06/08/19 0937    Subjective  Patient was sore after the last visit but he feels like it is muscle soreness. He flet osrenss in his hips and obliques.    Pertinent History  A-fib, anxiety, SVT, pulmonary embolisms    Limitations  Standing;Sitting    How long can you sit comfortably?  no limit    Diagnostic tests  Notjoing on his back or hips for a while    Currently in Pain?  Yes    Pain Score  4     Pain Location  Back    Pain Orientation  Right;Left    Pain Descriptors / Indicators  Aching    Pain Type  Chronic pain    Pain Onset  More than a month ago    Pain Frequency  Intermittent    Aggravating Factors   standing and walking    Pain Relieving Factors  sitting    Effect of Pain on Daily Activities  standing and walking                        [facility] Adult PT Treatment/Exercise - 06/08/19 0001      Lumbar Exercises: Stretches   Active Hamstring Stretch Limitations  3x20 seconds sitting     Lower Trunk Rotation Limitations  x20 reviewed for HEP     Piriformis Stretch  Limitations  3x20 sec hold       Lumbar Exercises: Aerobic   Nustep  L5 5 min       Lumbar Exercises: Seated   Other Seated Lumbar Exercises  Seated on Dyna Disc: horrizontal abduction x20; Shoulder flexion with abd x20; LAQ x15 each leg; bilateral ER       Lumbar Exercises: Supine   Pelvic Tilt  10 reps;10 seconds    Other Supine Lumbar Exercises  march with PPT 2x10 given for HEP     Other Supine Lumbar Exercises  Supine clamshell x20 red with abdominal bracing  Manual Therapy   Manual Therapy  Joint mobilization;Manual Traction    Manual therapy comments  passive knee extension stretching     Joint Mobilization  PA and AP glides to the knee to improve extnesion; sidelying PA mobilization from L2-L5     Manual Traction  LAD3x30 sec hold left right 1x30 but felt pulling in the quad and knee             PT Education - 06/08/19 0939    Education Details  HEP and dsymptom management    Person(s) Educated  Patient    Methods  Explanation;Demonstration;Tactile cues;Verbal cues    Comprehension  Verbalized understanding;Returned demonstration;Verbal cues required;Tactile cues required       PT Short Term Goals - 05/31/19 2126      PT SHORT TERM GOAL #1   Title  Patient will stand for > 7 min without increased back and hip pain    Baseline  can stand for about 10-15 minutes    Time  3    Period  Weeks    Status  On-going    Target Date  05/08/19      PT SHORT TERM GOAL #2   Title  Patient will ambualte 300' without singlepoint cane without increased pain    Baseline  still using cane    Time  3    Period  Weeks    Status  On-going    Target Date  05/08/19      PT SHORT TERM GOAL #3   Title  Patient will increase bilateral LE strength to 4+/5    Time  3    Period  Weeks    Status  On-going    Target Date  05/08/19        PT Long Term Goals - 05/31/19 2129      PT LONG TERM GOAL #1   Title  Patient will stand for 30 minutes without increased pain in  order to perfrom ADL's    Baseline  10 minutes    Time  6    Period  Weeks    Status  On-going      PT LONG TERM GOAL #2   Title  Patient will ambualte 1000' with LRAD without increased pain    Baseline  may continues to need a cane    Time  6    Period  Weeks    Status  On-going            Plan - 06/08/19 1551    Clinical Impression Statement  Patient performed sitting shoulder exercises today 2nd to soreness in the hips from lsast visit. He did well. He had mild spasming in his lower lumbar spine. he continues to walk straighter without the cane.    Personal Factors and Comorbidities  Comorbidity 1;Comorbidity 2;Comorbidity 3+    Comorbidities  SVT; right knee replacement; old hip fixation    Examination-Activity Limitations  Carry;Stand;Stairs;Squat;Transfers;Locomotion Level    Examination-Participation Restrictions  Community Activity;Shop    Stability/Clinical Decision Making  Evolving/Moderate complexity    Clinical Decision Making  Moderate    Rehab Potential  Good    PT Frequency  2x / week    PT Duration  6 weeks    PT Treatment/Interventions  ADLs/Self Care Home Management;Cryotherapy;Electrical Stimulation;Iontophoresis 4mg /ml Dexamethasone;Moist Heat;Ultrasound;DME Instruction;Neuromuscular re-education;Therapeutic exercise;Therapeutic activities;Patient/family education;Manual techniques;Passive range of motion;Dry needling;Taping    PT Next Visit Plan  continue core strengthening; manual therapy PRN; add standing hip abduction and extension;  assess symptoms follwing fall. Advance standing activity as tolerated.    PT Home Exercise Plan  hamstring and piriformins stretch; supine hip abduction, PPT, LTR, PPT with arm and with knee raises    Consulted and Agree with Plan of Care  Patient       Patient will benefit from skilled therapeutic intervention in order to improve the following deficits and impairments:  Abnormal gait, Decreased activity tolerance, Pain,  Decreased strength, Decreased range of motion, Increased muscle spasms, Decreased endurance  Visit Diagnosis: Chronic bilateral low back pain without sciatica  Pain in left hip  Other abnormalities of gait and mobility     Problem List Patient Active Problem List   Diagnosis Date Noted  . Breast lump 03/29/2019  . SVT (supraventricular tachycardia) ([place]) 01/21/2019  . S/P right total knee arthroplasty 05/24/2018  . Insomnia due to medical condition 12/23/2016  . BPH (benign prostatic hyperplasia) 10/21/2016  . Essential hypertension 10/21/2016  . MDD (major depressive disorder) 10/21/2016  . Obesity (BMI 30-39.9) 03/07/2016  . History of alcohol abuse 03/07/2016  . History of colon polyps 11/18/2015  . Paroxysmal atrial fibrillation ([facility])   . Restless legs syndrome 02/08/2014  . Generalized anxiety disorder 12/12/2013  . Recurrent unilateral inguinal hernia 07/17/2013  . Nonrheumatic mitral valve insufficiency 04/25/2013  . Current use of long term anticoagulation   . Obstructive sleep apnea 03/09/2013  . Chronic bilateral low back pain with sciatica 03/21/2012  . HLD (hyperlipidemia) 03/21/2012  . Arthralgia of hip 03/21/2012  . History of pulmonary embolism 03/19/2012  . Bilateral inguinal hernia 05/29/2011  . Uncomplicated asthma [id number]  . Anxiety state 07/18/2007    Carney Living PT DPT 06/08/2019, 3:56 PM  [facility] [address] [place], [place], [address] Phone: [phone]   Fax:  [phone]  Name: Jahmari Samec. MRN: [id number] Date of Birth: 1950-02-27

## 2019-06-13 ENCOUNTER — Encounter: Payer: Self-pay | Admitting: Physical Therapy

## 2019-06-13 ENCOUNTER — Other Ambulatory Visit: Payer: Self-pay

## 2019-06-13 ENCOUNTER — Ambulatory Visit: Payer: Medicare Other | Admitting: Physical Therapy

## 2019-06-13 DIAGNOSIS — M545 Low back pain, unspecified: Secondary | ICD-10-CM

## 2019-06-13 DIAGNOSIS — R2689 Other abnormalities of gait and mobility: Secondary | ICD-10-CM

## 2019-06-13 DIAGNOSIS — M25552 Pain in left hip: Secondary | ICD-10-CM

## 2019-06-13 DIAGNOSIS — G8929 Other chronic pain: Secondary | ICD-10-CM

## 2019-06-13 NOTE — Therapy (Signed)
[place], [place], [address] Phone: [phone]   Fax:  [phone]  Physical Therapy Treatment  Patient Details  Name: Brandon [name]. MRN: [id number] Date of Birth: 05-19-1950 Referring Provider (PT): Dr Dorris Singh    Encounter Date: 06/13/2019  PT End of Session - 06/13/19 1531    Visit Number  12    Number of Visits  20    Date for PT Re-Evaluation  07/12/19    Authorization Type  Mcr /BCBS progress not e on visit 18    PT Start Time  1015    PT Stop Time  1056    PT Time Calculation (min)  41 min    Activity Tolerance  Patient tolerated treatment well    Behavior During Therapy  WFL for tasks assessed/performed       Past Medical History:  Diagnosis Date  . Anxiety   . Arthritis   . Benign neoplasm of colon   . Benign prostatic hyperplasia with urinary obstruction   . Bilateral pulmonary embolism ([place]) 3/14   admitted to [facility],  treated with Xarelto  . Cataract   . Chronic pain   . History of kidney stones   . History of pulmonary embolism 03/19/2012  . Hyperlipemia   . Hypertension   . Insomnia   . Major depressive disorder, recurrent episode ([place])   . Mitral regurgitation 04/24/2013   Mild by TEE  . Obesity   . OSA (obstructive sleep apnea)    noncompliant with CPAP.  07/27/13- awaiting a CPAP- unable to tolerate mask  . Osteoporosis   . Persistent atrial fibrillation ([place])   . Restless leg syndrome    takes gabapentin  . Sciatica   . SVT (supraventricular tachycardia) ([place]) 03/07/2016  . Thrombus of left atrial appendage 03/09/2013  . Ventral hernia, unspecified, without mention of obstruction or gangrene    right abdominal wall    Past Surgical History:  Procedure Laterality Date  . CARDIOVERSION N/A 03/21/2012   Procedure: CARDIOVERSION;  Surgeon: Birdie Riddle, MD;  Location: [facility] ENDOSCOPY;  Service: Cardiovascular;  Laterality: N/A;  . CARDIOVERSION N/A 04/24/2013   Procedure: CARDIOVERSION;  Surgeon: Dorothy Spark, MD;  Location: [facility] ENDOSCOPY;  Service: Cardiovascular;  Laterality: N/A;  . CARDIOVERSION N/A 06/27/2015   Procedure: CARDIOVERSION;  Surgeon: Larey Dresser, MD;  Location: [place];  Service: Cardiovascular;  Laterality: N/A;  . CATARACT EXTRACTION    . COLONOSCOPY W/ POLYPECTOMY    . ELECTROPHYSIOLOGIC STUDY N/A 07/30/2015   Procedure: Atrial Fibrillation Ablation;  Surgeon: Thompson Grayer, MD;  Location: [place] CV LAB;  Service: Cardiovascular;  Laterality: N/A;  . EXTRACORPOREAL SHOCK WAVE LITHOTRIPSY Right 10/29/2016   Procedure: RIGHT EXTRACORPOREAL SHOCK WAVE LITHOTRIPSY (ESWL);  Surgeon: Alexis Frock, MD;  [facility];  Service: Urology;  Laterality: Right;  . EYE SURGERY Right    cataract  . FEMUR FRACTURE SURGERY    . HERNIA REPAIR  07/06/2011  . INGUINAL HERNIA REPAIR Right 07/28/2013   Procedure: RIGHT INGUINAL HERNIA REPAIR;  Surgeon: Imogene Burn. Georgette [place], MD;  Location: [place];  Service: General;  Laterality: Right;  . INGUINAL HERNIA REPAIR Right 09/22/2016   Procedure: LAPAROSCOPIC RIGHT INGUINAL HERNIA;  Surgeon: Kinsinger, Arta [place], MD;  [facility];  Service: General;  Laterality: Right;  With MESH  . INSERTION OF MESH Right 07/28/2013   Procedure: INSERTION OF MESH;  Surgeon: Imogene Burn. Georgette [place], MD;  Location: [place];  Service: General;  Laterality: Right;  . KNEE ARTHROSCOPY     left  . REPLACEMENT TOTAL KNEE Left   . ROTATOR CUFF REPAIR     right  . ROTATOR CUFF REPAIR Left 03/08/2014   DR SUPPLE  . SHOULDER ARTHROSCOPY WITH ROTATOR CUFF REPAIR AND SUBACROMIAL DECOMPRESSION Left 03/08/2014   Procedure: LEFT SHOULDER ARTHROSCOPY WITH ROTATOR CUFF REPAIR/SUBACROMIAL DECOMPRESSION/DISTAL CLAVICLE RESECTION;  Surgeon: [place] Shutter, MD;  Location: [place];  Service: Orthopedics;  Laterality: Left;  . SVT ABLATION N/A 01/23/2019   Procedure: SVT ABLATION;  Surgeon: Evans Lance, MD;  Location: [place] CV  LAB;  Service: Cardiovascular;  Laterality: N/A;  . TEE WITHOUT CARDIOVERSION N/A 03/21/2012   Procedure: TRANSESOPHAGEAL ECHOCARDIOGRAM (TEE);  Surgeon: Birdie Riddle, MD;  Location: [place];  Service: Cardiovascular;  Laterality: N/A;  . TEE WITHOUT CARDIOVERSION N/A 04/24/2013   Procedure: TRANSESOPHAGEAL ECHOCARDIOGRAM (TEE);  Surgeon: Dorothy Spark, MD;  Location: [place];  Service: Cardiovascular;  Laterality: N/A;  . TEE WITHOUT CARDIOVERSION N/A 07/29/2015   Procedure: TRANSESOPHAGEAL ECHOCARDIOGRAM (TEE);  Surgeon: Fay Records, MD;  Location: [facility] ENDOSCOPY;  Service: Cardiovascular;  Laterality: N/A;  . TONSILLECTOMY    . TOTAL KNEE ARTHROPLASTY Right 05/24/2018   Procedure: RIGHT TOTAL KNEE ARTHROPLASTY;  Surgeon: Paralee Cancel, MD;  [facility];  Service: Orthopedics;  Laterality: Right;  70 mins    There were no vitals filed for this visit.  Subjective Assessment - 06/13/19 1057    Subjective  Patient has no complaints at this time. he repoirts his walking distance is still limited but he has not been using his cane. he hhas been working on his exercises.    Pertinent History  A-fib, anxiety, SVT, pulmonary embolisms    Limitations  Standing;Sitting    Diagnostic tests  Notjoing on his back or hips for a while    Currently in Pain?  Yes    Pain Score  3     Pain Location  Back    Pain Orientation  Right    Pain Descriptors / Indicators  Aching    Pain Type  Chronic pain    Pain Onset  More than a month ago    Pain Frequency  Intermittent    Aggravating Factors   standing and walking    Pain Relieving Factors  sitting    Effect of Pain on Daily Activities  standing and walking                        [facility] Adult PT Treatment/Exercise - 06/13/19 0001      Self-Care   Other Self-Care Comments   reviewed hpow to use heel wedge and how to adjust it.       Lumbar Exercises: Standing   Scapular Retraction Limitations  x20 blue     Shoulder  Extension Limitations  x20 blue     Other Standing Lumbar Exercises  pallof press 2x15 14# each direction     Other Standing Lumbar Exercises  chop 2x15 each direction 14lbs       Manual Therapy   Manual Therapy  Joint mobilization;Manual Traction    Manual therapy comments  passive knee extension stretching     Joint Mobilization  PA and AP glides to the knee to improve extnesion; sidelying PA mobilization from L2-L5     Manual Traction  LAD3x30 sec hold left right 1x30 but felt pulling in the quad and knee  PT Education - 06/13/19 1530    Education Details  reviewed HEP and symptom management    Person(s) Educated  Patient    Methods  Explanation;Demonstration    Comprehension  Verbalized understanding;Returned demonstration       PT Short Term Goals - 05/31/19 2126      PT SHORT TERM GOAL #1   Title  Patient will stand for > 7 min without increased back and hip pain    Baseline  can stand for about 10-15 minutes    Time  3    Period  Weeks    Status  On-going    Target Date  05/08/19      PT SHORT TERM GOAL #2   Title  Patient will ambualte 300' without singlepoint cane without increased pain    Baseline  still using cane    Time  3    Period  Weeks    Status  On-going    Target Date  05/08/19      PT SHORT TERM GOAL #3   Title  Patient will increase bilateral LE strength to 4+/5    Time  3    Period  Weeks    Status  On-going    Target Date  05/08/19        PT Long Term Goals - 05/31/19 2129      PT LONG TERM GOAL #1   Title  Patient will stand for 30 minutes without increased pain in order to perfrom ADL's    Baseline  10 minutes    Time  6    Period  Weeks    Status  On-going      PT LONG TERM GOAL #2   Title  Patient will ambualte 1000' with LRAD without increased pain    Baseline  may continues to need a cane    Time  6    Period  Weeks    Status  On-going            Plan - 06/13/19 1535    Clinical Impression Statement   Patient continues to tolerate treatment well. he is perfroming high level core exercises. Therapy gave him a heel wedge. As his knee extension is improving his leg length descrpency is increasing. Therapy talked to him about udse of the heel wedge. He may need to purchase a large heel wedge.    Personal Factors and Comorbidities  Comorbidity 1;Comorbidity 2;Comorbidity 3+    Comorbidities  SVT; right knee replacement; old hip fixation    Examination-Activity Limitations  Carry;Stand;Stairs;Squat;Transfers;Locomotion Level    Examination-Participation Restrictions  Community Activity;Shop    Stability/Clinical Decision Making  Evolving/Moderate complexity    Clinical Decision Making  Moderate    Rehab Potential  Good    PT Frequency  2x / week    PT Duration  6 weeks    PT Treatment/Interventions  ADLs/Self Care Home Management;Cryotherapy;Electrical Stimulation;Iontophoresis 4mg /ml Dexamethasone;Moist Heat;Ultrasound;DME Instruction;Neuromuscular re-education;Therapeutic exercise;Therapeutic activities;Patient/family education;Manual techniques;Passive range of motion;Dry needling;Taping    PT Next Visit Plan  continue core strengthening; manual therapy PRN; add standing hip abduction and extension; assess symptoms follwing fall. Advance standing activity as tolerated.    PT Home Exercise Plan  hamstring and piriformins stretch; supine hip abduction, PPT, LTR, PPT with arm and with knee raises    Consulted and Agree with Plan of Care  Patient       Patient will benefit from skilled therapeutic intervention in order to improve the following deficits and  impairments:  Abnormal gait, Decreased activity tolerance, Pain, Decreased strength, Decreased range of motion, Increased muscle spasms, Decreased endurance  Visit Diagnosis: Chronic bilateral low back pain without sciatica  Pain in left hip  Other abnormalities of gait and mobility     Problem List Patient Active Problem List    Diagnosis Date Noted  . Breast lump 03/29/2019  . SVT (supraventricular tachycardia) ([place]) 01/21/2019  . S/P right total knee arthroplasty 05/24/2018  . Insomnia due to medical condition 12/23/2016  . BPH (benign prostatic hyperplasia) 10/21/2016  . Essential hypertension 10/21/2016  . MDD (major depressive disorder) 10/21/2016  . Obesity (BMI 30-39.9) 03/07/2016  . History of alcohol abuse 03/07/2016  . History of colon polyps 11/18/2015  . Paroxysmal atrial fibrillation (HCC)   . Restless legs syndrome 02/08/2014  . Generalized anxiety disorder 12/12/2013  . Recurrent unilateral inguinal hernia 07/17/2013  . Nonrheumatic mitral valve insufficiency 04/25/2013  . Current use of long term anticoagulation   . Obstructive sleep apnea 03/09/2013  . Chronic bilateral low back pain with sciatica 03/21/2012  . HLD (hyperlipidemia) 03/21/2012  . Arthralgia of hip 03/21/2012  . History of pulmonary embolism 03/19/2012  . Bilateral inguinal hernia 05/29/2011  . Uncomplicated asthma 81/82/9937  . Anxiety state 07/18/2007    Carney Living PT DPT  06/13/2019, 3:39 PM  [facility] [address] [place], [place], [address] Phone: [phone]   Fax:  [phone]  Name: Brandon [name]. MRN: [id number] Date of Birth: 08-31-1950

## 2019-06-14 ENCOUNTER — Encounter (HOSPITAL_COMMUNITY): Payer: Self-pay | Admitting: Nurse Practitioner

## 2019-06-14 ENCOUNTER — Ambulatory Visit (HOSPITAL_COMMUNITY)
Admission: RE | Admit: 2019-06-14 | Discharge: 2019-06-14 | Disposition: A | Discharge status: Home / Self Care | Payer: Medicare Other | Source: Ambulatory Visit | Attending: Nurse Practitioner | Admitting: Nurse Practitioner

## 2019-06-14 VITALS — BP 112/80 | HR 61 | Ht 75.5 in | Wt 309.2 lb

## 2019-06-14 DIAGNOSIS — I4891 Unspecified atrial fibrillation: Secondary | ICD-10-CM | POA: Insufficient documentation

## 2019-06-14 DIAGNOSIS — Z8249 Family history of ischemic heart disease and other diseases of the circulatory system: Secondary | ICD-10-CM | POA: Diagnosis not present

## 2019-06-14 DIAGNOSIS — D6869 Other thrombophilia: Secondary | ICD-10-CM | POA: Diagnosis not present

## 2019-06-14 DIAGNOSIS — I471 Supraventricular tachycardia: Secondary | ICD-10-CM | POA: Insufficient documentation

## 2019-06-14 DIAGNOSIS — Z87891 Personal history of nicotine dependence: Secondary | ICD-10-CM | POA: Diagnosis not present

## 2019-06-14 DIAGNOSIS — I10 Essential (primary) hypertension: Secondary | ICD-10-CM | POA: Insufficient documentation

## 2019-06-14 NOTE — Progress Notes (Signed)
Patient ID: Brandon Patricia., male   DOB: 26-Dec-1950, 69 y.o.   MRN: [id number]    Primary Care Physician: Danna Hefty, DO Referring Physician: Dr. Raynaldo Opitz Oliveto [name] is a 69 y.o. male with a h/o afib, s/p ablation, maintaining SR, off amiodarone. He had one episode of afib in July, none since then He has gained 20 lbs since Covid inactivity. He had rt knee replacement in May.  He recently had 2 separet ER visits for SVT with RVR and very hypotensive. The first time in ER, he was given adenosine and broke but with return of SVT, he was cardioverted. He went home only to have  the same scenario to repeat again the next day. This time he was kept and treated. These episodes  may have been set off by pt not having his coreg 25 mg bid for a week as he was waiting for refills. He was given amiodarone IV but when he returned to SR, this was stopped and he was gotten back on his BB. Pt states that Dr. Lovena Le wanted to see him after discharge  to  discuss SVT ablation. CHA2DS2VASc of 2, on eliquis.   F/u afib clinic, 06/14/19. He had an SVT ablation that was successful and he has not had any further tachyarrhythmia's. Overall he reports that he id soing well. Undergoing PT in after math of rt knee replacement. No issues with his anticoagulation.    Today, he denies symptoms of palpitations, chest pain, shortness of breath, orthopnea, PND, lower extremity edema, dizziness, presyncope, syncope, or neurologic sequela. The patient is tolerating medications without difficulties and is otherwise without complaint today.   Past Medical History:  Diagnosis Date  . Anxiety   . Arthritis   . Benign neoplasm of colon   . Benign prostatic hyperplasia with urinary obstruction   . Bilateral pulmonary embolism ([place]) 3/14   admitted to [facility],  treated with Xarelto  . Cataract   . Chronic pain   . History of kidney stones   . History of pulmonary embolism 03/19/2012  . Hyperlipemia   . Hypertension     . Insomnia   . Major depressive disorder, recurrent episode ([place])   . Mitral regurgitation 04/24/2013   Mild by TEE  . Obesity   . OSA (obstructive sleep apnea)    noncompliant with CPAP.  07/27/13- awaiting a CPAP- unable to tolerate mask  . Osteoporosis   . Persistent atrial fibrillation ([place])   . Restless leg syndrome    takes gabapentin  . Sciatica   . SVT (supraventricular tachycardia) ([place]) 03/07/2016  . Thrombus of left atrial appendage 03/09/2013  . Ventral hernia, unspecified, without mention of obstruction or gangrene    right abdominal wall   Past Surgical History:  Procedure Laterality Date  . CARDIOVERSION N/A 03/21/2012   Procedure: CARDIOVERSION;  Surgeon: Birdie Riddle, MD;  Location: [facility] ENDOSCOPY;  Service: Cardiovascular;  Laterality: N/A;  . CARDIOVERSION N/A 04/24/2013   Procedure: CARDIOVERSION;  Surgeon: Dorothy Spark, MD;  Location: [facility] ENDOSCOPY;  Service: Cardiovascular;  Laterality: N/A;  . CARDIOVERSION N/A 06/27/2015   Procedure: CARDIOVERSION;  Surgeon: Larey Dresser, MD;  Location: [place];  Service: Cardiovascular;  Laterality: N/A;  . CATARACT EXTRACTION    . COLONOSCOPY W/ POLYPECTOMY    . ELECTROPHYSIOLOGIC STUDY N/A 07/30/2015   Procedure: Atrial Fibrillation Ablation;  Surgeon: Thompson Grayer, MD;  Location: [place] CV LAB;  Service: Cardiovascular;  Laterality: N/A;  .  EXTRACORPOREAL SHOCK WAVE LITHOTRIPSY Right 10/29/2016   Procedure: RIGHT EXTRACORPOREAL SHOCK WAVE LITHOTRIPSY (ESWL);  Surgeon: Alexis Frock, MD;  [facility];  Service: Urology;  Laterality: Right;  . EYE SURGERY Right    cataract  . FEMUR FRACTURE SURGERY    . HERNIA REPAIR  07/06/2011  . INGUINAL HERNIA REPAIR Right 07/28/2013   Procedure: RIGHT INGUINAL HERNIA REPAIR;  Surgeon: Imogene Burn. Georgette Dover, MD;  Location: [place];  Service: General;  Laterality: Right;  . INGUINAL HERNIA REPAIR Right 09/22/2016   Procedure: LAPAROSCOPIC RIGHT INGUINAL HERNIA;  Surgeon:  Kinsinger, Arta Bruce, MD;  [facility];  Service: General;  Laterality: Right;  With MESH  . INSERTION OF MESH Right 07/28/2013   Procedure: INSERTION OF MESH;  Surgeon: Imogene Burn. Georgette Dover, MD;  Location: [place];  Service: General;  Laterality: Right;  . KNEE ARTHROSCOPY     left  . REPLACEMENT TOTAL KNEE Left   . ROTATOR CUFF REPAIR     right  . ROTATOR CUFF REPAIR Left 03/08/2014   DR SUPPLE  . SHOULDER ARTHROSCOPY WITH ROTATOR CUFF REPAIR AND SUBACROMIAL DECOMPRESSION Left 03/08/2014   Procedure: LEFT SHOULDER ARTHROSCOPY WITH ROTATOR CUFF REPAIR/SUBACROMIAL DECOMPRESSION/DISTAL CLAVICLE RESECTION;  Surgeon: Marin Shutter, MD;  Location: [place];  Service: Orthopedics;  Laterality: Left;  . SVT ABLATION N/A 01/23/2019   Procedure: SVT ABLATION;  Surgeon: Evans Lance, MD;  Location: [place] CV LAB;  Service: Cardiovascular;  Laterality: N/A;  . TEE WITHOUT CARDIOVERSION N/A 03/21/2012   Procedure: TRANSESOPHAGEAL ECHOCARDIOGRAM (TEE);  Surgeon: Birdie Riddle, MD;  Location: [place];  Service: Cardiovascular;  Laterality: N/A;  . TEE WITHOUT CARDIOVERSION N/A 04/24/2013   Procedure: TRANSESOPHAGEAL ECHOCARDIOGRAM (TEE);  Surgeon: Dorothy Spark, MD;  Location: [place];  Service: Cardiovascular;  Laterality: N/A;  . TEE WITHOUT CARDIOVERSION N/A 07/29/2015   Procedure: TRANSESOPHAGEAL ECHOCARDIOGRAM (TEE);  Surgeon: Fay Records, MD;  Location: [facility] ENDOSCOPY;  Service: Cardiovascular;  Laterality: N/A;  . TONSILLECTOMY    . TOTAL KNEE ARTHROPLASTY Right 05/24/2018   Procedure: RIGHT TOTAL KNEE ARTHROPLASTY;  Surgeon: Paralee Cancel, MD;  [facility];  Service: Orthopedics;  Laterality: Right;  70 mins      Allergies  Allergen Reactions  . Ace Inhibitors Cough  . Albuterol Other (See Comments)    Racing heart    Social History   Socioeconomic History  . Marital status: Married    Spouse name: Not on file  . Number of children: Not on file  . Years of  education: Not on file  . Highest education level: Not on file  Occupational History  . Not on file  Tobacco Use  . Smoking status: Former Smoker    Packs/day: 0.50    Years: 5.00    Pack years: 2.50    Types: Cigarettes    Quit date: 1976    Years since quitting: 45.4  . Smokeless tobacco: Never Used  Substance and Sexual Activity  . Alcohol use: No    Comment: Former EtOH abuse, stopped 06/2016  . Drug use: No    Comment: negative hx for IV drug abuse  . Sexual activity: Not on file  Other Topics Concern  . 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 Difficulty of Paying Living Expenses:   Food Insecurity:   . Worried About Charity fundraiser in the Last Year:   . Arboriculturist in the Last Year:   Transportation Needs:   . Film/video editor (Medical):   Marland Kitchen Lack of Transportation (Non-Medical):   Physical Activity:   . Days of Exercise per Week:   . Minutes of Exercise per Session:   Stress:   . Feeling of Stress :   Social Connections:   . Frequency of Communication with Friends and Family:   . Frequency of Social Gatherings with Friends and Family:   . Attends Religious Services:   . Active Member of Clubs or Organizations:   . Attends Archivist Meetings:   Marland Kitchen Marital Status:   Intimate Partner Violence:   . Fear of Current or Ex-Partner:   . Emotionally Abused:   Marland Kitchen Physically Abused:   . Sexually Abused:     Family History  Problem Relation Age of Onset  . Cancer Father        bone  . Heart failure Mother   . Hypertension Mother   . Asthma Mother   . Cancer Paternal Grandmother        ovarian  . CVA Other        Fam Hx of multiple myeloma  . Diabetes Other        Fam Hx of DM    ROS- All systems are  reviewed and negative except as per the HPI above  Physical Exam: Vitals:   06/14/19 0918  BP: 112/80  Pulse: 61  Weight: (!) 140.3 kg  Height: 6' 3.5" (1.918 m)    GEN- The patient is well appearing, alert and oriented x 3 today.   Head- normocephalic, atraumatic Eyes-  Sclera clear, conjunctiva pink Ears- hearing intact Oropharynx- clear Neck- supple, no JVP Lymph- no cervical lymphadenopathy Lungs- Clear to ausculation bilaterally, normal work of breathing Heart- Regular rate and rhythm, no murmurs, rubs or gallops, PMI not laterally displaced GI- soft, NT, ND, + BS Extremities- no clubbing, cyanosis, or edema MS- no significant deformity or atrophy Skin- no rash or lesion Psych- euthymic mood, full affect Neuro- strength and sensation are intact  EKG- SR at 61 bpm, PR int 226 ms, Qrs int 108 ms, QTc 410 ms   Assessment and Plan: 1. SVT/afib  s/p ablation for SVT I/2021 Doing well since then in SR today  No further episodes   2. HTN Stable   3. CHA2DS2VASc score of 2 Continue  eliquis 5 mg bid  F/u with Dr. Lovena Le in 6 months    Geroge Baseman. Lalaine Overstreet, [facility] [address] [place],  [address] [phone]

## 2019-06-15 ENCOUNTER — Other Ambulatory Visit: Payer: Self-pay

## 2019-06-15 ENCOUNTER — Ambulatory Visit: Payer: Medicare Other | Admitting: Physical Therapy

## 2019-06-15 ENCOUNTER — Encounter: Payer: Self-pay | Admitting: Physical Therapy

## 2019-06-15 DIAGNOSIS — M25552 Pain in left hip: Secondary | ICD-10-CM

## 2019-06-15 DIAGNOSIS — G8929 Other chronic pain: Secondary | ICD-10-CM

## 2019-06-15 DIAGNOSIS — R2689 Other abnormalities of gait and mobility: Secondary | ICD-10-CM

## 2019-06-15 DIAGNOSIS — M545 Low back pain, unspecified: Secondary | ICD-10-CM

## 2019-06-15 NOTE — Therapy (Signed)
[place], [place], [address] Phone: [phone]   Fax:  [phone]  Physical Therapy Treatment  Patient Details  Name: Brandon [name]. MRN: [id number] Date of Birth: June 12, 1950 Referring Provider (PT): Dr Dorris Singh    Encounter Date: 06/15/2019   PT End of Session - 06/15/19 1243    Visit Number 13    Number of Visits 20    Date for PT Re-Evaluation 07/12/19    Authorization Type [employer] progress not e on visit 18    PT Start Time 0932    PT Stop Time 1014    PT Time Calculation (min) 42 min    Activity Tolerance Patient tolerated treatment well    Behavior During Therapy WFL for tasks assessed/performed           Past Medical History:  Diagnosis Date  . Anxiety   . Arthritis   . Benign neoplasm of colon   . Benign prostatic hyperplasia with urinary obstruction   . Bilateral pulmonary embolism ([place]) 3/14   admitted to [facility],  treated with Xarelto  . Cataract   . Chronic pain   . History of kidney stones   . History of pulmonary embolism 03/19/2012  . Hyperlipemia   . Hypertension   . Insomnia   . Major depressive disorder, recurrent episode ([place])   . Mitral regurgitation 04/24/2013   Mild by TEE  . Obesity   . OSA (obstructive sleep apnea)    noncompliant with CPAP.  07/27/13- awaiting a CPAP- unable to tolerate mask  . Osteoporosis   . Persistent atrial fibrillation ([place])   . Restless leg syndrome    takes gabapentin  . Sciatica   . SVT (supraventricular tachycardia) ([place]) 03/07/2016  . Thrombus of left atrial appendage 03/09/2013  . Ventral hernia, unspecified, without mention of obstruction or gangrene    right abdominal wall    Past Surgical History:  Procedure Laterality Date  . CARDIOVERSION N/A 03/21/2012   Procedure: CARDIOVERSION;  Surgeon: Birdie Riddle, MD;  Location: [facility] ENDOSCOPY;  Service: Cardiovascular;  Laterality: N/A;  . CARDIOVERSION N/A 04/24/2013    Procedure: CARDIOVERSION;  Surgeon: Dorothy Spark, MD;  Location: [facility] ENDOSCOPY;  Service: Cardiovascular;  Laterality: N/A;  . CARDIOVERSION N/A 06/27/2015   Procedure: CARDIOVERSION;  Surgeon: Larey Dresser, MD;  Location: [place];  Service: Cardiovascular;  Laterality: N/A;  . CATARACT EXTRACTION    . COLONOSCOPY W/ POLYPECTOMY    . ELECTROPHYSIOLOGIC STUDY N/A 07/30/2015   Procedure: Atrial Fibrillation Ablation;  Surgeon: Thompson Grayer, MD;  Location: [place] CV LAB;  Service: Cardiovascular;  Laterality: N/A;  . EXTRACORPOREAL SHOCK WAVE LITHOTRIPSY Right 10/29/2016   Procedure: RIGHT EXTRACORPOREAL SHOCK WAVE LITHOTRIPSY (ESWL);  Surgeon: Alexis Frock, MD;  [facility];  Service: Urology;  Laterality: Right;  . EYE SURGERY Right    cataract  . FEMUR FRACTURE SURGERY    . HERNIA REPAIR  07/06/2011  . INGUINAL HERNIA REPAIR Right 07/28/2013   Procedure: RIGHT INGUINAL HERNIA REPAIR;  Surgeon: Imogene Burn. Georgette Dover, MD;  Location: [place];  Service: General;  Laterality: Right;  . INGUINAL HERNIA REPAIR Right 09/22/2016   Procedure: LAPAROSCOPIC RIGHT INGUINAL HERNIA;  Surgeon: Kinsinger, Arta Bruce, MD;  [facility];  Service: General;  Laterality: Right;  With MESH  . INSERTION OF MESH Right 07/28/2013   Procedure: INSERTION OF MESH;  Surgeon: Imogene Burn. Georgette Dover, MD;  Location: [place];  Service: General;  Laterality: Right;  . KNEE ARTHROSCOPY     left  . REPLACEMENT TOTAL KNEE Left   . ROTATOR CUFF REPAIR     right  . ROTATOR CUFF REPAIR Left 03/08/2014   DR SUPPLE  . SHOULDER ARTHROSCOPY WITH ROTATOR CUFF REPAIR AND SUBACROMIAL DECOMPRESSION Left 03/08/2014   Procedure: LEFT SHOULDER ARTHROSCOPY WITH ROTATOR CUFF REPAIR/SUBACROMIAL DECOMPRESSION/DISTAL CLAVICLE RESECTION;  Surgeon: Marin Shutter, MD;  Location: [place];  Service: Orthopedics;  Laterality: Left;  . SVT ABLATION N/A 01/23/2019   Procedure: SVT ABLATION;  Surgeon: Evans Lance, MD;  Location: [place] CV  LAB;  Service: Cardiovascular;  Laterality: N/A;  . TEE WITHOUT CARDIOVERSION N/A 03/21/2012   Procedure: TRANSESOPHAGEAL ECHOCARDIOGRAM (TEE);  Surgeon: Birdie Riddle, MD;  Location: [place];  Service: Cardiovascular;  Laterality: N/A;  . TEE WITHOUT CARDIOVERSION N/A 04/24/2013   Procedure: TRANSESOPHAGEAL ECHOCARDIOGRAM (TEE);  Surgeon: Dorothy Spark, MD;  Location: [place];  Service: Cardiovascular;  Laterality: N/A;  . TEE WITHOUT CARDIOVERSION N/A 07/29/2015   Procedure: TRANSESOPHAGEAL ECHOCARDIOGRAM (TEE);  Surgeon: Fay Records, MD;  Location: [facility] ENDOSCOPY;  Service: Cardiovascular;  Laterality: N/A;  . TONSILLECTOMY    . TOTAL KNEE ARTHROPLASTY Right 05/24/2018   Procedure: RIGHT TOTAL KNEE ARTHROPLASTY;  Surgeon: Paralee Cancel, MD;  [facility];  Service: Orthopedics;  Laterality: Right;  70 mins    There were no vitals filed for this visit.   Subjective Assessment - 06/15/19 0938    Subjective Patient reports his back has been sore over the past few days. He feels like he may need a prednisone pack. He feels like his pain is the worst when he stands.    Pertinent History A-fib, anxiety, SVT, pulmonary embolisms    Limitations Standing;Sitting    How long can you sit comfortably? no limit    Diagnostic tests Notjoing on his back or hips for a while    Currently in Pain? Yes    Pain Score 1     Pain Location Back    Pain Orientation Right    Pain Descriptors / Indicators Aching    Pain Type Chronic pain    Pain Onset More than a month ago    Pain Frequency Intermittent    Aggravating Factors  stanfding and walking    Pain Relieving Factors sitting    Effect of Pain on Daily Activities standing and walking    Multiple Pain Sites No                             [facility] Adult PT Treatment/Exercise - 06/15/19 0001      Lumbar Exercises: Stretches   Active Hamstring Stretch Limitations 3x20 seconds sitting     Lower Trunk Rotation Limitations  x20 reviewed for HEP       Lumbar Exercises: Seated   Other Seated Lumbar Exercises setaed bilateral er x20; seated horizontal abduction x20 red; seated shoulder flexion 3lb ; ball roll out x10 5 sec hold 5x lateral with 5 second hold; ball  press x20;       Manual Therapy   Manual Therapy Joint mobilization;Manual Traction    Manual therapy comments passive knee extension stretching     Joint Mobilization PA and AP glides to the knee to improve extnesion; sidelying PA mobilization from L2-L5     Manual Traction LAD3x30 sec hold left right 1x30 but felt pulling in the quad and knee  PT Education - 06/15/19 0940    Education Details reviewed program when back is painful    Person(s) Educated Patient    Methods Explanation;Demonstration;Tactile cues;Verbal cues    Comprehension Verbalized understanding;Returned demonstration;Verbal cues required;Tactile cues required            PT Short Term Goals - 05/31/19 2126      PT SHORT TERM GOAL #1   Title Patient will stand for > 7 min without increased back and hip pain    Baseline can stand for about 10-15 minutes    Time 3    Period Weeks    Status On-going    Target Date 05/08/19      PT SHORT TERM GOAL #2   Title Patient will ambualte 300' without singlepoint cane without increased pain    Baseline still using cane    Time 3    Period Weeks    Status On-going    Target Date 05/08/19      PT SHORT TERM GOAL #3   Title Patient will increase bilateral LE strength to 4+/5    Time 3    Period Weeks    Status On-going    Target Date 05/08/19             PT Long Term Goals - 05/31/19 2129      PT LONG TERM GOAL #1   Title Patient will stand for 30 minutes without increased pain in order to perfrom ADL's    Baseline 10 minutes    Time 6    Period Weeks    Status On-going      PT LONG TERM GOAL #2   Title Patient will ambualte 1000' with LRAD without increased pain    Baseline may continues to  need a cane    Time 6    Period Weeks    Status On-going                 Plan - 06/15/19 1244    Clinical Impression Statement Patient was limited by hip pain today. He has a hisotry of right hip OA. Therapy perfromed posterior hip mobilization to decrease anterior pain with flexion. He has had his wedge in starting yesterday. he was advised if his pain remains high to try to adjust the wedge.    Personal Factors and Comorbidities Comorbidity 1;Comorbidity 2;Comorbidity 3+    Comorbidities SVT; right knee replacement; old hip fixation    Examination-Activity Limitations Carry;Stand;Stairs;Squat;Transfers;Locomotion Level    Examination-Participation Restrictions Community Activity;Shop    Stability/Clinical Decision Making Evolving/Moderate complexity    Clinical Decision Making Moderate    Rehab Potential Good    PT Frequency 2x / week    PT Duration 6 weeks    PT Treatment/Interventions ADLs/Self Care Home Management;Cryotherapy;Electrical Stimulation;Iontophoresis 4mg /ml Dexamethasone;Moist Heat;Ultrasound;DME Instruction;Neuromuscular re-education;Therapeutic exercise;Therapeutic activities;Patient/family education;Manual techniques;Passive range of motion;Dry needling;Taping    PT Next Visit Plan continue core strengthening; manual therapy PRN; add standing hip abduction and extension; assess symptoms follwing fall. Advance standing activity as tolerated.    PT Home Exercise Plan hamstring and piriformins stretch; supine hip abduction, PPT, LTR, PPT with arm and with knee raises    Consulted and Agree with Plan of Care Patient           Patient will benefit from skilled therapeutic intervention in order to improve the following deficits and impairments:  Abnormal gait, Decreased activity tolerance, Pain, Decreased strength, Decreased range of motion, Increased muscle spasms, Decreased endurance  Visit  Diagnosis: Chronic bilateral low back pain without sciatica  Pain in  left hip  Other abnormalities of gait and mobility     Problem List Patient Active Problem List   Diagnosis Date Noted  . Breast lump 03/29/2019  . SVT (supraventricular tachycardia) ([place]) 01/21/2019  . S/P right total knee arthroplasty 05/24/2018  . Insomnia due to medical condition 12/23/2016  . BPH (benign prostatic hyperplasia) 10/21/2016  . Essential hypertension 10/21/2016  . MDD (major depressive disorder) 10/21/2016  . Obesity (BMI 30-39.9) 03/07/2016  . History of alcohol abuse 03/07/2016  . History of colon polyps 11/18/2015  . Paroxysmal atrial fibrillation (HCC)   . Restless legs syndrome 02/08/2014  . Generalized anxiety disorder 12/12/2013  . Recurrent unilateral inguinal hernia 07/17/2013  . Nonrheumatic mitral valve insufficiency 04/25/2013  . Current use of long term anticoagulation   . Obstructive sleep apnea 03/09/2013  . Chronic bilateral low back pain with sciatica 03/21/2012  . HLD (hyperlipidemia) 03/21/2012  . Arthralgia of hip 03/21/2012  . History of pulmonary embolism 03/19/2012  . Bilateral inguinal hernia 05/29/2011  . Uncomplicated asthma 30/16/0109  . Anxiety state 07/18/2007    Carney Living PT DPT  06/15/2019, 12:57 PM  [facility] [address] [place], [place], [address] Phone: [phone]   Fax:  [phone]  Name: Brandon [name]. MRN: [id number] Date of Birth: May 08, 1950

## 2019-06-20 ENCOUNTER — Ambulatory Visit: Payer: Medicare Other | Admitting: Physical Therapy

## 2019-06-22 ENCOUNTER — Other Ambulatory Visit: Payer: Self-pay

## 2019-06-22 ENCOUNTER — Ambulatory Visit: Payer: Medicare Other | Admitting: Physical Therapy

## 2019-06-22 ENCOUNTER — Encounter: Payer: Self-pay | Admitting: Physical Therapy

## 2019-06-22 DIAGNOSIS — M25552 Pain in left hip: Secondary | ICD-10-CM

## 2019-06-22 DIAGNOSIS — R2689 Other abnormalities of gait and mobility: Secondary | ICD-10-CM

## 2019-06-22 DIAGNOSIS — G8929 Other chronic pain: Secondary | ICD-10-CM

## 2019-06-22 DIAGNOSIS — M545 Low back pain, unspecified: Secondary | ICD-10-CM

## 2019-06-22 NOTE — Therapy (Addendum)
[place], [place], [address] Phone: [phone]   Fax:  [phone]  Physical Therapy Treatment/Discharge   Patient Details  Name: Brandon [name]. MRN: [id number] Date of Birth: May 29, 1950 Referring Provider (PT): Dr Dorris Singh    Encounter Date: 06/22/2019   PT End of Session - 06/22/19 1228    Visit Number 14    Number of Visits 20    Date for PT Re-Evaluation 07/12/19    Authorization Type [employer] progress not e on visit 18    PT Start Time 0931    PT Stop Time 1012    PT Time Calculation (min) 41 min    Activity Tolerance Patient tolerated treatment well    Behavior During Therapy [facility] for tasks assessed/performed           Past Medical History:  Diagnosis Date  . Anxiety   . Arthritis   . Benign neoplasm of colon   . Benign prostatic hyperplasia with urinary obstruction   . Bilateral pulmonary embolism ([place]) 3/14   admitted to [facility],  treated with Xarelto  . Cataract   . Chronic pain   . History of kidney stones   . History of pulmonary embolism 03/19/2012  . Hyperlipemia   . Hypertension   . Insomnia   . Major depressive disorder, recurrent episode ([place])   . Mitral regurgitation 04/24/2013   Mild by TEE  . Obesity   . OSA (obstructive sleep apnea)    noncompliant with CPAP.  07/27/13- awaiting a CPAP- unable to tolerate mask  . Osteoporosis   . Persistent atrial fibrillation ([place])   . Restless leg syndrome    takes gabapentin  . Sciatica   . SVT (supraventricular tachycardia) ([place]) 03/07/2016  . Thrombus of left atrial appendage 03/09/2013  . Ventral hernia, unspecified, without mention of obstruction or gangrene    right abdominal wall    Past Surgical History:  Procedure Laterality Date  . CARDIOVERSION N/A 03/21/2012   Procedure: CARDIOVERSION;  Surgeon: Birdie Riddle, MD;  Location: [facility] ENDOSCOPY;  Service: Cardiovascular;  Laterality: N/A;  . CARDIOVERSION N/A  04/24/2013   Procedure: CARDIOVERSION;  Surgeon: Dorothy Spark, MD;  Location: [facility] ENDOSCOPY;  Service: Cardiovascular;  Laterality: N/A;  . CARDIOVERSION N/A 06/27/2015   Procedure: CARDIOVERSION;  Surgeon: Larey Dresser, MD;  Location: [place];  Service: Cardiovascular;  Laterality: N/A;  . CATARACT EXTRACTION    . COLONOSCOPY W/ POLYPECTOMY    . ELECTROPHYSIOLOGIC STUDY N/A 07/30/2015   Procedure: Atrial Fibrillation Ablation;  Surgeon: Thompson Grayer, MD;  Location: [place] CV LAB;  Service: Cardiovascular;  Laterality: N/A;  . EXTRACORPOREAL SHOCK WAVE LITHOTRIPSY Right 10/29/2016   Procedure: RIGHT EXTRACORPOREAL SHOCK WAVE LITHOTRIPSY (ESWL);  Surgeon: Alexis Frock, MD;  Location: [facility] ORS;  Service: Urology;  Laterality: Right;  . EYE SURGERY Right    cataract  . FEMUR FRACTURE SURGERY    . HERNIA REPAIR  07/06/2011  . INGUINAL HERNIA REPAIR Right 07/28/2013   Procedure: RIGHT INGUINAL HERNIA REPAIR;  Surgeon: Imogene Burn. Georgette Dover, MD;  Location: [place];  Service: General;  Laterality: Right;  . INGUINAL HERNIA REPAIR Right 09/22/2016   Procedure: LAPAROSCOPIC RIGHT INGUINAL HERNIA;  Surgeon: Kinsinger, Arta Bruce, MD;  Location: [facility] ORS;  Service: General;  Laterality: Right;  With MESH  . INSERTION OF MESH Right 07/28/2013   Procedure: INSERTION OF MESH;  Surgeon: Imogene Burn. Georgette Dover, MD;  Location: [place];  Service:  General;  Laterality: Right;  . KNEE ARTHROSCOPY     left  . REPLACEMENT TOTAL KNEE Left   . ROTATOR CUFF REPAIR     right  . ROTATOR CUFF REPAIR Left 03/08/2014   DR SUPPLE  . SHOULDER ARTHROSCOPY WITH ROTATOR CUFF REPAIR AND SUBACROMIAL DECOMPRESSION Left 03/08/2014   Procedure: LEFT SHOULDER ARTHROSCOPY WITH ROTATOR CUFF REPAIR/SUBACROMIAL DECOMPRESSION/DISTAL CLAVICLE RESECTION;  Surgeon: Marin Shutter, MD;  Location: [place];  Service: Orthopedics;  Laterality: Left;  . SVT ABLATION N/A 01/23/2019   Procedure: SVT ABLATION;  Surgeon: Evans Lance, MD;  Location: [place] CV LAB;  Service: Cardiovascular;  Laterality: N/A;  . TEE WITHOUT CARDIOVERSION N/A 03/21/2012   Procedure: TRANSESOPHAGEAL ECHOCARDIOGRAM (TEE);  Surgeon: Birdie Riddle, MD;  Location: [place];  Service: Cardiovascular;  Laterality: N/A;  . TEE WITHOUT CARDIOVERSION N/A 04/24/2013   Procedure: TRANSESOPHAGEAL ECHOCARDIOGRAM (TEE);  Surgeon: Dorothy Spark, MD;  Location: [place];  Service: Cardiovascular;  Laterality: N/A;  . TEE WITHOUT CARDIOVERSION N/A 07/29/2015   Procedure: TRANSESOPHAGEAL ECHOCARDIOGRAM (TEE);  Surgeon: Fay Records, MD;  Location: [facility] ENDOSCOPY;  Service: Cardiovascular;  Laterality: N/A;  . TONSILLECTOMY    . TOTAL KNEE ARTHROPLASTY Right 05/24/2018   Procedure: RIGHT TOTAL KNEE ARTHROPLASTY;  Surgeon: Paralee Cancel, MD;  Location: [facility] ORS;  Service: Orthopedics;  Laterality: Right;  70 mins    There were no vitals filed for this visit.   Subjective Assessment - 06/22/19 1003    Subjective Patient reports his back is doing well but his hip has been hurting onthe left. thge left hip has been hurting about 3 days.    Pertinent History A-fib, anxiety, SVT, pulmonary embolisms    Limitations Standing;Sitting    How long can you sit comfortably? no limit    Diagnostic tests Notjoing on his back or hips for a while    Currently in Pain? Yes    Pain Score 5     Pain Location Hip    Pain Orientation Right    Pain Descriptors / Indicators Aching    Pain Type Chronic pain    Pain Onset More than a month ago    Pain Frequency Intermittent    Aggravating Factors  standing and walking    Pain Relieving Factors sitting    Effect of Pain on Daily Activities standing and walking                             [facility] Adult PT Treatment/Exercise - 06/22/19 0001      Lumbar Exercises: Stretches   Active Hamstring Stretch Limitations 3x20 seconds sitting     Lower Trunk Rotation Limitations x20 reviewed for HEP     Piriformis Stretch  Limitations 3x20 sec hold       Lumbar Exercises: Aerobic   Nustep L5 5 min       Lumbar Exercises: Seated   Other Seated Lumbar Exercises setaed bilateral er x20; seated horizontal abduction x20 red; seated shoulder flexion 3lb ; ball roll out x10 5 sec hold 5x lateral with 5 second hold; ball  press x20;       Manual Therapy   Manual Therapy Joint mobilization;Manual Traction    Manual therapy comments passive knee extension stretching     Joint Mobilization PA and AP glides to the knee to improve extnesion; sidelying PA mobilization from L2-L5     Manual Traction LAD3x30 sec hold left  right 1x30 but felt pulling in the quad and knee                  PT Education - 06/22/19 1005    Education Details reviewed HEp and symptom management    Person(s) Educated Patient    Methods Demonstration;Explanation;Tactile cues;Verbal cues    Comprehension Verbalized understanding;Returned demonstration;Verbal cues required;Tactile cues required            PT Short Term Goals - 05/31/19 2126      PT SHORT TERM GOAL #1   Title Patient will stand for > 7 min without increased back and hip pain    Baseline can stand for about 10-15 minutes    Time 3    Period Weeks    Status On-going    Target Date 05/08/19      PT SHORT TERM GOAL #2   Title Patient will ambualte 300' without singlepoint cane without increased pain    Baseline still using cane    Time 3    Period Weeks    Status On-going    Target Date 05/08/19      PT SHORT TERM GOAL #3   Title Patient will increase bilateral LE strength to 4+/5    Time 3    Period Weeks    Status On-going    Target Date 05/08/19             PT Long Term Goals - 05/31/19 2129      PT LONG TERM GOAL #1   Title Patient will stand for 30 minutes without increased pain in order to perfrom ADL's    Baseline 10 minutes    Time 6    Period Weeks    Status On-going      PT LONG TERM GOAL #2   Title Patient will ambualte 1000' with  LRAD without increased pain    Baseline may continues to need a cane    Time 6    Period Weeks    Status On-going                 Plan - 06/22/19 1026    Clinical Impression Statement Patient had left hip pain today. he had a trigger point in his left gluteal. He reported improved pain after manual therapy. Therapy reviewed hip stretching for when his pain increase. Patient had some pinching in his anterior hip with stretching but improved when he used a towel. He reports he still ffeels like his wedge is too small on the left. He was advised he may need a custom showe if the ewedge dosent work.    Personal Factors and Comorbidities Comorbidity 1;Comorbidity 2;Comorbidity 3+    Comorbidities SVT; right knee replacement; old hip fixation    Examination-Activity Limitations Carry;Stand;Stairs;Squat;Transfers;Locomotion Level    Examination-Participation Restrictions Community Activity;Shop    Stability/Clinical Decision Making Evolving/Moderate complexity    Clinical Decision Making Moderate    Rehab Potential Good    PT Frequency 2x / week    PT Duration 6 weeks    PT Treatment/Interventions ADLs/Self Care Home Management;Cryotherapy;Electrical Stimulation;Iontophoresis 27m/ml Dexamethasone;Moist Heat;Ultrasound;DME Instruction;Neuromuscular re-education;Therapeutic exercise;Therapeutic activities;Patient/family education;Manual techniques;Passive range of motion;Dry needling;Taping    PT Next Visit Plan continue core strengthening; manual therapy PRN; add standing hip abduction and extension; assess symptoms follwing fall. Advance standing activity as tolerated.    PT Home Exercise Plan hamstring and piriformins stretch; supine hip abduction, PPT, LTR, PPT with arm and with knee raises  Consulted and Agree with Plan of Care Patient           Patient will benefit from skilled therapeutic intervention in order to improve the following deficits and impairments:  Abnormal gait,  Decreased activity tolerance, Pain, Decreased strength, Decreased range of motion, Increased muscle spasms, Decreased endurance  Visit Diagnosis: Chronic bilateral low back pain without sciatica  Pain in left hip  Other abnormalities of gait and mobility    PHYSICAL THERAPY DISCHARGE SUMMARY  Visits from Start of Care: 14  Current functional level related to goals / functional outcomes: Improved pain overall. Improved ability to walk   Remaining deficits: Continued pain with standing   Education / Equipment: HEP   Plan: Patient agrees to discharge.  Patient goals were met. Patient is being discharged due to meeting the stated rehab goals.  ?????      Problem List Patient Active Problem List   Diagnosis Date Noted  . Breast lump 03/29/2019  . SVT (supraventricular tachycardia) ([place]) 01/21/2019  . S/P right total knee arthroplasty 05/24/2018  . Insomnia due to medical condition 12/23/2016  . BPH (benign prostatic hyperplasia) 10/21/2016  . Essential hypertension 10/21/2016  . MDD (major depressive disorder) 10/21/2016  . Obesity (BMI 30-39.9) 03/07/2016  . History of alcohol abuse 03/07/2016  . History of colon polyps 11/18/2015  . Paroxysmal atrial fibrillation ([facility])   . Restless legs syndrome 02/08/2014  . Generalized anxiety disorder 12/12/2013  . Recurrent unilateral inguinal hernia 07/17/2013  . Nonrheumatic mitral valve insufficiency 04/25/2013  . Current use of long term anticoagulation   . Obstructive sleep apnea 03/09/2013  . Chronic bilateral low back pain with sciatica 03/21/2012  . HLD (hyperlipidemia) 03/21/2012  . Arthralgia of hip 03/21/2012  . History of pulmonary embolism 03/19/2012  . Bilateral inguinal hernia 05/29/2011  . Uncomplicated asthma 02/40/9735  . Anxiety state 07/18/2007    Carney Living PT DPT  06/22/2019, 12:30 PM  [facility] [address] [place], [place], [address] Phone:  [phone]   Fax:  [phone]  Name: Brandon [name]. MRN: [id number] Date of Birth: 1950-06-15

## 2019-07-27 ENCOUNTER — Other Ambulatory Visit: Payer: Self-pay | Admitting: Family Medicine

## 2019-07-27 DIAGNOSIS — E782 Mixed hyperlipidemia: Secondary | ICD-10-CM

## 2019-07-27 DIAGNOSIS — G2581 Restless legs syndrome: Secondary | ICD-10-CM

## 2019-08-28 DIAGNOSIS — H43393 Other vitreous opacities, bilateral: Secondary | ICD-10-CM | POA: Diagnosis not present

## 2019-08-28 DIAGNOSIS — Z961 Presence of intraocular lens: Secondary | ICD-10-CM | POA: Diagnosis not present

## 2019-08-28 DIAGNOSIS — H43813 Vitreous degeneration, bilateral: Secondary | ICD-10-CM | POA: Diagnosis not present

## 2019-09-27 ENCOUNTER — Ambulatory Visit (INDEPENDENT_AMBULATORY_CARE_PROVIDER_SITE_OTHER): Payer: Medicare Other | Admitting: Physical Medicine and Rehabilitation

## 2019-09-27 ENCOUNTER — Telehealth: Payer: Self-pay | Admitting: Physical Medicine and Rehabilitation

## 2019-09-27 ENCOUNTER — Other Ambulatory Visit: Payer: Self-pay

## 2019-09-27 ENCOUNTER — Encounter: Payer: Self-pay | Admitting: Physical Medicine and Rehabilitation

## 2019-09-27 ENCOUNTER — Telehealth: Payer: Self-pay

## 2019-09-27 VITALS — BP 110/68 | HR 67

## 2019-09-27 DIAGNOSIS — E669 Obesity, unspecified: Secondary | ICD-10-CM | POA: Diagnosis not present

## 2019-09-27 DIAGNOSIS — G8929 Other chronic pain: Secondary | ICD-10-CM | POA: Diagnosis not present

## 2019-09-27 DIAGNOSIS — M47816 Spondylosis without myelopathy or radiculopathy, lumbar region: Secondary | ICD-10-CM | POA: Diagnosis not present

## 2019-09-27 DIAGNOSIS — G894 Chronic pain syndrome: Secondary | ICD-10-CM | POA: Diagnosis not present

## 2019-09-27 DIAGNOSIS — M545 Low back pain, unspecified: Secondary | ICD-10-CM

## 2019-09-27 DIAGNOSIS — F331 Major depressive disorder, recurrent, moderate: Secondary | ICD-10-CM | POA: Diagnosis not present

## 2019-09-27 NOTE — Telephone Encounter (Signed)
Scheduled

## 2019-09-27 NOTE — Telephone Encounter (Signed)
Patient is returning a call from [place]. Patient is asking for a call back. Please call patient at [phone].

## 2019-09-27 NOTE — Telephone Encounter (Signed)
Left message #1. Patient needs to schedule planned L3-4, L4-5, and L5-S1 MBB #1.

## 2019-09-27 NOTE — Telephone Encounter (Signed)
Patient called in wanting to speak with you about a test being done and also wants to sch an appt with [place]

## 2019-09-27 NOTE — Progress Notes (Signed)
Low back pain. Changes from left to right side. When pain is on right "pain is in front of right hip." When pain is on left patient has left buttock pain.  Numeric Pain Rating Scale and Functional Assessment Average Pain 7 Pain Right Now 0 My pain is intermittent, dull and aching Pain is worse with: walking and standing Pain improves with: rest   In the last MONTH (on 0-10 scale) has pain interfered with the following?  1. General activity like being  able to carry out your everyday physical activities such as walking, climbing stairs, carrying groceries, or moving a chair?  Rating(10)  2. Relation with others like being able to carry out your usual social activities and roles such as  activities at home, at work and in your community. Rating(10)  3. Enjoyment of life such that you have  been bothered by emotional problems such as feeling anxious, depressed or irritable?  Rating(10)

## 2019-09-28 ENCOUNTER — Ambulatory Visit: Payer: Self-pay

## 2019-09-28 ENCOUNTER — Encounter: Payer: Self-pay | Admitting: Physical Medicine and Rehabilitation

## 2019-09-28 ENCOUNTER — Ambulatory Visit (INDEPENDENT_AMBULATORY_CARE_PROVIDER_SITE_OTHER): Payer: Medicare Other | Admitting: Physical Medicine and Rehabilitation

## 2019-09-28 ENCOUNTER — Other Ambulatory Visit: Payer: Self-pay | Admitting: Internal Medicine

## 2019-09-28 VITALS — BP 141/92 | HR 66

## 2019-09-28 DIAGNOSIS — M47816 Spondylosis without myelopathy or radiculopathy, lumbar region: Secondary | ICD-10-CM

## 2019-09-28 MED ORDER — BUPIVACAINE HCL 0.5 % IJ SOLN: 3.0000 mL | Freq: Once | INTRAMUSCULAR | Status: DC

## 2019-09-28 MED ORDER — METHYLPREDNISOLONE ACETATE 80 MG/ML IJ SUSP: 80.0000 mg | Freq: Once | INTRAMUSCULAR | Status: DC

## 2019-09-28 NOTE — Telephone Encounter (Signed)
Eliquis 5mg  refill request received. Patient is 69 years old, weight-140.3kg, Crea-0.81 on 01/21/2019, Diagnosis-Afib, and last seen by Roderic Palau on 06/14/2019. Dose is appropriate based on dosing criteria. Will send in refill to requested pharmacy.

## 2019-09-28 NOTE — Progress Notes (Signed)
Pt states Lower back pain. Pt state when ever he is cooking or cleaning he has to sit down for a break. Pt state has to sit down and pain meds at night to sleep that ease the pain.  Numeric Pain Rating Scale and Functional Assessment Average Pain 4   In the last MONTH (on 0-10 scale) has pain interfered with the following?  1. General activity like being  able to carry out your everyday physical activities such as walking, climbing stairs, carrying groceries, or moving a chair?  Rating(9)   +Driver, +BT, -Dye Allergies.

## 2019-10-02 ENCOUNTER — Encounter: Payer: Self-pay | Admitting: Physical Medicine and Rehabilitation

## 2019-10-03 NOTE — Progress Notes (Signed)
Brandon [name]. - 69 y.o. male MRN [id number]  Date of birth: Dec 17, 1950  Office Visit Note: Visit Date: 09/28/2019 PCP: Danna Hefty, DO Referred by: Danna Hefty, DO  Subjective: Chief Complaint  Patient presents with  . Lower Back - Pain   HPI:  Brandon [name]. is a 69 y.o. male who comes in today at the request of Dr. Laurence Spates for planned Bilateral L3-L4, L4-L5 and L5-S1 Lumbar facet/medial branch block with fluoroscopic guidance.  The patient has failed conservative care including home exercise, medications, time and activity modification.  This injection will be diagnostic and hopefully therapeutic.  Please see requesting physician notes for further details and justification.  Exam has shown concordant pain with facet joint loading.  MRI reviewed with images and spine model.  MRI reviewed in the note below.   ROS Otherwise per HPI.  Assessment & Plan: Visit Diagnoses:  1. Spondylosis without myelopathy or radiculopathy, lumbar region     Plan: No additional findings.   Meds & Orders:  Meds ordered this encounter  Medications  . bupivacaine (MARCAINE) 0.5 % (with pres) injection 3 mL  . methylPREDNISolone acetate (DEPO-MEDROL) injection 80 mg    Orders Placed This Encounter  Procedures  . Nerve Block  . XR C-ARM NO REPORT    Follow-up: Return for Review Pain Diary.   Procedures: No procedures performed  Lumbar Diagnostic Facet Joint Nerve Block with Fluoroscopic Guidance   Patient: Brandon [name].      Date of Birth: May 31, 1950 MRN: [id number] PCP: Danna Hefty, DO      Visit Date: 09/28/2019   Universal Protocol:    Date/Time: 09/28/215:54 AM  Consent Given By: the patient  Position: PRONE  Additional Comments: Vital signs were monitored before and after the procedure. Patient was prepped and draped in the usual sterile fashion. The correct patient, procedure, and site was verified.   Injection Procedure Details:  Procedure Site  One Meds Administered:  Meds ordered this encounter  Medications  . bupivacaine (MARCAINE) 0.5 % (with pres) injection 3 mL  . methylPREDNISolone acetate (DEPO-MEDROL) injection 80 mg     Laterality: Bilateral  Location/Site:  L3-L4 L4-L5 L5-S1  Needle size: 22 ga.  Needle type:spinal  Needle Placement: Oblique pedical  Findings:   -Comments: There was excellent flow of contrast along the articular pillars without intravascular flow.  Procedure Details: The fluoroscope beam is vertically oriented in AP and then obliqued 15 to 20 degrees to the ipsilateral side of the desired nerve to achieve the "Scotty dog" appearance.  The skin over the target area of the junction of the superior articulating process and the transverse process (sacral ala if blocking the L5 dorsal rami) was locally anesthetized with a 1 ml volume of 1% Lidocaine without Epinephrine.  The spinal needle was inserted and advanced in a trajectory view down to the target.   After contact with periosteum and negative aspirate for blood and CSF, correct placement without intravascular or epidural spread was confirmed by injecting 0.5 ml. of Isovue-250.  A spot radiograph was obtained of this image.    Next, a 0.5 ml. volume of the injectate described above was injected. The needle was then redirected to the other facet joint nerves mentioned above if needed.  Prior to the procedure, the patient was given a Pain Diary which was completed for baseline measurements.  After the procedure, the patient rated their pain every 30 minutes and will continue rating  at this frequency for a total of 5 hours.  The patient has been asked to complete the Diary and return to [place] by mail, fax or hand delivered as soon as possible.   Additional Comments:  The patient tolerated the procedure well Dressing: 2 x 2 sterile gauze and Band-Aid    Post-procedure details: Patient was observed during the procedure. Post-procedure  instructions were reviewed.  Patient left the clinic in stable condition.     Clinical History: Lumbar spine MRI dated 05/24/2017 by report from a [facility] Impression: : Interval increase in size of moderate right foraminal T12-L1 disc extrusion with mild cranial migration and resulting moderate severe right foraminal narrowing with some impingement and displacement of the exiting right T12 nerve root.  New left T11-12 synovial cyst with probable moderate spinal canal stenosis and some indenting of the left hemicord.  There is also moderate bilateral L3-4 foraminal narrowing     Objective:  VS:  HT:    WT:   BMI:     BP:(!) 141/92  HR:66bpm  TEMP: ( )  RESP:  Physical Exam Constitutional:      General: He is not in acute distress.    Appearance: Normal appearance. He is obese. He is not ill-appearing.  HENT:     Head: Normocephalic and atraumatic.     Right Ear: External ear normal.     Left Ear: External ear normal.  Eyes:     Extraocular Movements: Extraocular movements intact.  Cardiovascular:     Rate and Rhythm: Normal rate.     Pulses: Normal pulses.  Abdominal:     General: There is no distension.     Palpations: Abdomen is soft.  Musculoskeletal:        General: No tenderness or signs of injury.     Right lower leg: No edema.     Left lower leg: No edema.     Comments: Patient has good distal strength without clonus. Patient somewhat slow to rise from a seated position to full extension.  There is concordant low back pain with facet loading and lumbar spine extension rotation.  There are no definitive trigger points but the patient is somewhat tender across the lower back and PSIS.  There is no pain with hip rotation.   Skin:    Findings: No erythema or rash.  Neurological:     General: No focal deficit present.     Mental Status: He is alert and oriented to person, place, and time.     Sensory: No sensory deficit.     Motor: No weakness or abnormal muscle  tone.     Coordination: Coordination normal.  Psychiatric:        Mood and Affect: Mood normal.        Behavior: Behavior normal.      Imaging: No results found.

## 2019-10-03 NOTE — Procedures (Signed)
Lumbar Diagnostic Facet Joint Nerve Block with Fluoroscopic Guidance   Patient: Brandon [name].      Date of Birth: February 18, 1950 MRN: [id number] PCP: Danna Hefty, DO      Visit Date: 09/28/2019   Universal Protocol:    Date/Time: 09/28/215:54 AM  Consent Given By: the patient  Position: PRONE  Additional Comments: Vital signs were monitored before and after the procedure. Patient was prepped and draped in the usual sterile fashion. The correct patient, procedure, and site was verified.   Injection Procedure Details:  Procedure Site One Meds Administered:  Meds ordered this encounter  Medications  . bupivacaine (MARCAINE) 0.5 % (with pres) injection 3 mL  . methylPREDNISolone acetate (DEPO-MEDROL) injection 80 mg     Laterality: Bilateral  Location/Site:  L3-L4 L4-L5 L5-S1  Needle size: 22 ga.  Needle type:spinal  Needle Placement: Oblique pedical  Findings:   -Comments: There was excellent flow of contrast along the articular pillars without intravascular flow.  Procedure Details: The fluoroscope beam is vertically oriented in AP and then obliqued 15 to 20 degrees to the ipsilateral side of the desired nerve to achieve the "Scotty dog" appearance.  The skin over the target area of the junction of the superior articulating process and the transverse process (sacral ala if blocking the L5 dorsal rami) was locally anesthetized with a 1 ml volume of 1% Lidocaine without Epinephrine.  The spinal needle was inserted and advanced in a trajectory view down to the target.   After contact with periosteum and negative aspirate for blood and CSF, correct placement without intravascular or epidural spread was confirmed by injecting 0.5 ml. of Isovue-250.  A spot radiograph was obtained of this image.    Next, a 0.5 ml. volume of the injectate described above was injected. The needle was then redirected to the other facet joint nerves mentioned above if needed.  Prior to the  procedure, the patient was given a Pain Diary which was completed for baseline measurements.  After the procedure, the patient rated their pain every 30 minutes and will continue rating at this frequency for a total of 5 hours.  The patient has been asked to complete the Diary and return to [place] by mail, fax or hand delivered as soon as possible.   Additional Comments:  The patient tolerated the procedure well Dressing: 2 x 2 sterile gauze and Band-Aid    Post-procedure details: Patient was observed during the procedure. Post-procedure instructions were reviewed.  Patient left the clinic in stable condition.

## 2019-10-09 ENCOUNTER — Encounter: Payer: Self-pay | Admitting: Physical Medicine and Rehabilitation

## 2019-10-09 NOTE — Progress Notes (Signed)
Brandon [name]. - 69 y.o. male MRN [id number]  Date of birth: 09-24-1950  Office Visit Note: Visit Date: 09/27/2019 PCP: Danna Hefty, DO Referred by: Danna Hefty, DO  Subjective: Chief Complaint  Patient presents with  . Lower Back - Pain   HPI: Brandon [name]. is a 69 y.o. male who comes in today For new patient evaluation and management of chronic worsening severe recalcitrant axial low back pain.  He watched a seminar that we recently completed over Zoom concerning treatment of chronic back and neck pain particularly with newer technologies such as spinal cord stimulators as well as ablation.  By way of brief review the patient has been followed by Dr. Suella Broad at [facility].  He has had multiple epidural injections which have been successful at times and not successful at other times and really only fleeting relief.  He has had medication management with muscle relaxers as well as opiate treatment at times.  He has had multiple bouts of physical therapy continues with some home exercises now.  He does have concomitant problem with obesity but he is a fairly tall individual.  His biggest complaint is severe axial low back pain.  This is worse going from sit to stand and with standing and extension.  He has no pain down the legs.  No focal weakness.  This does interfere with his activities of daily living and is really getting to the point where he is getting more depressed about it.  He does carry a diagnosis of major depression.  This is treated.  He can have left side more than right side but it can flip-flop.  He gets a little bit of referral into the front of the right hip but no groin pain.  He gets some buttock pain on the left.  No radicular pain down the legs.  Rates his pain is a 7 out of 10 worse with walking and standing better at rest.  It is an intermittent dull and aching pain.  His biggest complaint is that this keeps him from doing really anything he wants to do and  he is really at his wits end at this point.  He has had no focal weakness no bowel or bladder changes no red flag symptoms.  No prior lumbar surgery.  Prior notes from primary care doctors as well as Dr. Nelva Bush reviewed.  Review of Systems  Musculoskeletal: Positive for back pain.  All other systems reviewed and are negative.  Otherwise per HPI.  Assessment & Plan: Visit Diagnoses:  1. Chronic bilateral low back pain without sciatica   2. Spondylosis without myelopathy or radiculopathy, lumbar region   3. Chronic pain syndrome   4. Moderate episode of recurrent major depressive disorder (HCC)   5. Obesity (BMI 30-39.9)     Plan: Findings:  1.  Chronic axial low back pain severe recalcitrant and really severely limiting his activities of daily living.  He would like to try to be more active to try to lose weight.  He does have an obesity issue but he is pretty tall individual.  He tries to do home exercises but right now his back pain is really bothersome.  It is worse with standing extension and facet loading.  MRI findings do show degenerative changes throughout without any high-grade stenosis or nerve compression.  In 2019 there was disc extrusion in the lower thoracic upper lumbar region.  Some moderate narrowing.  He had injection treatment for that  with Dr. Nelva Bush.  He has tried and failed all manner of conservative care with medications as well as physical therapy etc.  We talked him at great length today about interventional procedures.  We spoke face-to-face for more than 45 minutes in general talking about his history and the diagnostic ability of injections.  Together we decided to look at diagnostic medial branch blocks of the lower lumbar spine.  This would be diagnostic medial branch blocks of the left L3-4, L4-5 and L5-S1 facet joints.  This will be done in a double block paradigm with a pain diary.  Depending on results again would look more towards radiofrequency ablation as a  definitive treatment.  Patient could ultimately be a candidate for spinal cord stimulator trial although his body habitus would make that a little bit more difficult but not impossible.    Meds & Orders: No orders of the defined types were placed in this encounter.  No orders of the defined types were placed in this encounter.   Follow-up: Return for Diagnostic medial branch blocks as noted.   Procedures: No procedures performed  No notes on file   Clinical History: Lumbar spine MRI dated 05/24/2017 by report from a [facility] Impression: : Interval increase in size of moderate right foraminal T12-L1 disc extrusion with mild cranial migration and resulting moderate severe right foraminal narrowing with some impingement and displacement of the exiting right T12 nerve root.  New left T11-12 synovial cyst with probable moderate spinal canal stenosis and some indenting of the left hemicord.  There is also moderate bilateral L3-4 foraminal narrowing   He reports that he quit smoking about 45 years ago. His smoking use included cigarettes. He has a 2.50 pack-year smoking history. He has never used smokeless tobacco. No results for input(s): HGBA1C, LABURIC in the last 8760 hours.  Objective:  VS:  HT:    WT:   BMI:     BP:110/68  HR:67bpm  TEMP: ( )  RESP:  Physical Exam Constitutional:      General: He is not in acute distress.    Appearance: Normal appearance. He is obese. He is not ill-appearing.  HENT:     Head: Normocephalic and atraumatic.     Right Ear: External ear normal.     Left Ear: External ear normal.  Eyes:     Extraocular Movements: Extraocular movements intact.  Cardiovascular:     Rate and Rhythm: Normal rate.     Pulses: Normal pulses.  Abdominal:     General: There is no distension.     Palpations: Abdomen is soft.  Musculoskeletal:        General: No tenderness or signs of injury.     Right lower leg: No edema.     Left lower leg: No edema.      Comments: Patient has good distal strength without clonus.Patient somewhat slow to rise from a seated position to full extension.  There is concordant low back pain with facet loading and lumbar spine extension rotation.  There are no definitive trigger points but the patient is somewhat tender across the lower back and PSIS.  There is no pain with hip rotation.   Skin:    Findings: No erythema, lesion or rash.  Neurological:     General: No focal deficit present.     Mental Status: He is alert and oriented to person, place, and time.     Sensory: No sensory deficit.     Motor: No weakness or  abnormal muscle tone.     Coordination: Coordination normal.     Gait: Gait normal.  Psychiatric:        Mood and Affect: Mood normal.        Behavior: Behavior normal.     Ortho Exam  Imaging: No results found.  Past Medical/Family/Surgical/Social History: Medications & Allergies reviewed per EMR, new medications updated. Patient Active Problem List   Diagnosis Date Noted  . Breast lump 03/29/2019  . SVT (supraventricular tachycardia) ([place]) 01/21/2019  . S/P right total knee arthroplasty 05/24/2018  . Insomnia due to medical condition 12/23/2016  . BPH (benign prostatic hyperplasia) 10/21/2016  . Essential hypertension 10/21/2016  . MDD (major depressive disorder) 10/21/2016  . Obesity (BMI 30-39.9) 03/07/2016  . History of alcohol abuse 03/07/2016  . History of colon polyps 11/18/2015  . Paroxysmal atrial fibrillation (HCC)   . Restless legs syndrome 02/08/2014  . Generalized anxiety disorder 12/12/2013  . Recurrent unilateral inguinal hernia 07/17/2013  . Nonrheumatic mitral valve insufficiency 04/25/2013  . Current use of long term anticoagulation   . Obstructive sleep apnea 03/09/2013  . Chronic bilateral low back pain with sciatica 03/21/2012  . HLD (hyperlipidemia) 03/21/2012  . Arthralgia of hip 03/21/2012  . History of pulmonary embolism 03/19/2012  . Bilateral inguinal  hernia 05/29/2011  . Uncomplicated asthma 00/93/8182  . Anxiety state 07/18/2007   Past Medical History:  Diagnosis Date  . Anxiety   . Arthritis   . Benign neoplasm of colon   . Benign prostatic hyperplasia with urinary obstruction   . Bilateral pulmonary embolism ([place]) 3/14   admitted to [facility],  treated with Xarelto  . Cataract   . Chronic pain   . History of kidney stones   . History of pulmonary embolism 03/19/2012  . Hyperlipemia   . Hypertension   . Insomnia   . Major depressive disorder, recurrent episode ([place])   . Mitral regurgitation 04/24/2013   Mild by TEE  . Obesity   . OSA (obstructive sleep apnea)    noncompliant with CPAP.  07/27/13- awaiting a CPAP- unable to tolerate mask  . Osteoporosis   . Persistent atrial fibrillation ([place])   . Restless leg syndrome    takes gabapentin  . Sciatica   . SVT (supraventricular tachycardia) ([place]) 03/07/2016  . Thrombus of left atrial appendage 03/09/2013  . Ventral hernia, unspecified, without mention of obstruction or gangrene    right abdominal wall   Family History  Problem Relation Age of Onset  . Cancer Father        bone  . Heart failure Mother   . Hypertension Mother   . Asthma Mother   . Cancer Paternal Grandmother        ovarian  . CVA Other        Fam Hx of multiple myeloma  . Diabetes Other        Fam Hx of DM   Past Surgical History:  Procedure Laterality Date  . CARDIOVERSION N/A 03/21/2012   Procedure: CARDIOVERSION;  Surgeon: Birdie Riddle, MD;  Location: [facility] ENDOSCOPY;  Service: Cardiovascular;  Laterality: N/A;  . CARDIOVERSION N/A 04/24/2013   Procedure: CARDIOVERSION;  Surgeon: Dorothy Spark, MD;  Location: [facility] ENDOSCOPY;  Service: Cardiovascular;  Laterality: N/A;  . CARDIOVERSION N/A 06/27/2015   Procedure: CARDIOVERSION;  Surgeon: Larey Dresser, MD;  Location: [place];  Service: Cardiovascular;  Laterality: N/A;  . CATARACT EXTRACTION    . COLONOSCOPY W/ POLYPECTOMY    .  ELECTROPHYSIOLOGIC STUDY N/A 07/30/2015   Procedure: Atrial Fibrillation Ablation;  Surgeon: Thompson Grayer, MD;  Location: [place] CV LAB;  Service: Cardiovascular;  Laterality: N/A;  . EXTRACORPOREAL SHOCK WAVE LITHOTRIPSY Right 10/29/2016   Procedure: RIGHT EXTRACORPOREAL SHOCK WAVE LITHOTRIPSY (ESWL);  Surgeon: Alexis Frock, MD;  Location: [facility] ORS;  Service: Urology;  Laterality: Right;  . EYE SURGERY Right    cataract  . FEMUR FRACTURE SURGERY    . HERNIA REPAIR  07/06/2011  . INGUINAL HERNIA REPAIR Right 07/28/2013   Procedure: RIGHT INGUINAL HERNIA REPAIR;  Surgeon: Imogene Burn. Georgette Dover, MD;  Location: [employer];  Service: General;  Laterality: Right;  . INGUINAL HERNIA REPAIR Right 09/22/2016   Procedure: LAPAROSCOPIC RIGHT INGUINAL HERNIA;  Surgeon: Kinsinger, Arta Bruce, MD;  Location: [facility] ORS;  Service: General;  Laterality: Right;  With MESH  . INSERTION OF MESH Right 07/28/2013   Procedure: INSERTION OF MESH;  Surgeon: Imogene Burn. Georgette Dover, MD;  Location: [facility];  Service: General;  Laterality: Right;  . KNEE ARTHROSCOPY     left  . REPLACEMENT TOTAL KNEE Left   . ROTATOR CUFF REPAIR     right  . ROTATOR CUFF REPAIR Left 03/08/2014   DR SUPPLE  . SHOULDER ARTHROSCOPY WITH ROTATOR CUFF REPAIR AND SUBACROMIAL DECOMPRESSION Left 03/08/2014   Procedure: LEFT SHOULDER ARTHROSCOPY WITH ROTATOR CUFF REPAIR/SUBACROMIAL DECOMPRESSION/DISTAL CLAVICLE RESECTION;  Surgeon: Marin Shutter, MD;  Location: [place];  Service: Orthopedics;  Laterality: Left;  . SVT ABLATION N/A 01/23/2019   Procedure: SVT ABLATION;  Surgeon: Evans Lance, MD;  Location: [place] CV LAB;  Service: Cardiovascular;  Laterality: N/A;  . TEE WITHOUT CARDIOVERSION N/A 03/21/2012   Procedure: TRANSESOPHAGEAL ECHOCARDIOGRAM (TEE);  Surgeon: Birdie Riddle, MD;  Location: [place];  Service: Cardiovascular;  Laterality: N/A;  . TEE WITHOUT CARDIOVERSION N/A 04/24/2013   Procedure: TRANSESOPHAGEAL ECHOCARDIOGRAM (TEE);  Surgeon:  Dorothy Spark, MD;  Location: [place];  Service: Cardiovascular;  Laterality: N/A;  . TEE WITHOUT CARDIOVERSION N/A 07/29/2015   Procedure: TRANSESOPHAGEAL ECHOCARDIOGRAM (TEE);  Surgeon: Fay Records, MD;  Location: [facility] ENDOSCOPY;  Service: Cardiovascular;  Laterality: N/A;  . TONSILLECTOMY    . TOTAL KNEE ARTHROPLASTY Right 05/24/2018   Procedure: RIGHT TOTAL KNEE ARTHROPLASTY;  Surgeon: Paralee Cancel, MD;  Location: [facility];  Service: Orthopedics;  Laterality: Right;  70 mins   Social History   Occupational History  . Not on file  Tobacco Use  . Smoking status: Former Smoker    Packs/day: 0.50    Years: 5.00    Pack years: 2.50    Types: Cigarettes    Quit date: 1976    Years since quitting: 45.7  . Smokeless tobacco: Never Used  Vaping Use  . Vaping Use: Never used  Substance and Sexual Activity  . Alcohol use: No    Comment: Former EtOH abuse, stopped 06/2016  . Drug use: No    Comment: negative hx for IV drug abuse  . Sexual activity: Not on file

## 2019-10-10 ENCOUNTER — Telehealth: Payer: Self-pay | Admitting: Physical Medicine and Rehabilitation

## 2019-10-10 NOTE — Telephone Encounter (Signed)
Pt called stating he thought he was supposed to come back for a 2nd injection but then no one reached out; pt would like a CB to either set this up  [phone]

## 2019-10-10 NOTE — Telephone Encounter (Signed)
Please advise. I know he did send his pain diary back in.

## 2019-10-12 NOTE — Telephone Encounter (Signed)
Called pt and sch 10/26.

## 2019-10-18 ENCOUNTER — Telehealth: Payer: Self-pay

## 2019-10-18 NOTE — Telephone Encounter (Signed)
Called pt and resch 12/10.

## 2019-10-18 NOTE — Telephone Encounter (Signed)
Patient called in wanting to re sch his appt says he would like to be on a later date

## 2019-10-19 ENCOUNTER — Other Ambulatory Visit: Payer: Self-pay | Admitting: Family Medicine

## 2019-10-23 ENCOUNTER — Other Ambulatory Visit: Payer: Self-pay | Admitting: Family Medicine

## 2019-10-25 ENCOUNTER — Encounter: Payer: Medicare Other | Admitting: Physical Medicine and Rehabilitation

## 2019-10-30 DIAGNOSIS — Z125 Encounter for screening for malignant neoplasm of prostate: Secondary | ICD-10-CM | POA: Diagnosis not present

## 2019-10-30 DIAGNOSIS — R351 Nocturia: Secondary | ICD-10-CM | POA: Diagnosis not present

## 2019-10-30 DIAGNOSIS — R8279 Other abnormal findings on microbiological examination of urine: Secondary | ICD-10-CM | POA: Diagnosis not present

## 2019-10-30 DIAGNOSIS — R350 Frequency of micturition: Secondary | ICD-10-CM | POA: Diagnosis not present

## 2019-10-30 DIAGNOSIS — N401 Enlarged prostate with lower urinary tract symptoms: Secondary | ICD-10-CM | POA: Diagnosis not present

## 2019-10-31 ENCOUNTER — Ambulatory Visit: Payer: Self-pay

## 2019-10-31 ENCOUNTER — Encounter: Payer: Self-pay | Admitting: Physical Medicine and Rehabilitation

## 2019-10-31 ENCOUNTER — Other Ambulatory Visit: Payer: Self-pay

## 2019-10-31 ENCOUNTER — Ambulatory Visit (INDEPENDENT_AMBULATORY_CARE_PROVIDER_SITE_OTHER): Payer: Medicare Other | Admitting: Physical Medicine and Rehabilitation

## 2019-10-31 VITALS — BP 123/79 | HR 76

## 2019-10-31 DIAGNOSIS — M545 Low back pain, unspecified: Secondary | ICD-10-CM

## 2019-10-31 DIAGNOSIS — G8929 Other chronic pain: Secondary | ICD-10-CM

## 2019-10-31 DIAGNOSIS — M47816 Spondylosis without myelopathy or radiculopathy, lumbar region: Secondary | ICD-10-CM | POA: Diagnosis not present

## 2019-10-31 MED ORDER — METHYLPREDNISOLONE ACETATE 80 MG/ML IJ SUSP
80.0000 mg | Freq: Once | INTRAMUSCULAR | Status: AC
  Administered 2019-10-31: 80 mg

## 2019-10-31 NOTE — Progress Notes (Signed)
Pt state lower back pain. Pt state walking and standing makes the pain worse. Pt state he sit down and use heating pads to help ease the pain. Pt has hx of inj on 9/23 Pt state it started off good but pt went home to clean up and the pain return.  Numeric Pain Rating Scale and Functional Assessment Average Pain 7   In the last MONTH (on 0-10 scale) has pain interfered with the following?  1. General activity like being  able to carry out your everyday physical activities such as walking, climbing stairs, carrying groceries, or moving a chair?  Rating(10)   +Driver, -BT, -Dye Allergies.

## 2019-11-01 ENCOUNTER — Encounter: Payer: Self-pay | Admitting: Physical Medicine and Rehabilitation

## 2019-11-08 ENCOUNTER — Telehealth: Payer: Self-pay | Admitting: Physical Medicine and Rehabilitation

## 2019-11-08 NOTE — Telephone Encounter (Signed)
Needs auth and scheduling for bilateral L3-4, L4-5, and L5-S1 rF. 2 Different appointments for one hour each.

## 2019-11-08 NOTE — Telephone Encounter (Signed)
Pt not req Auth# Medicare Part B.

## 2019-11-14 ENCOUNTER — Telehealth: Payer: Self-pay

## 2019-11-14 NOTE — Telephone Encounter (Signed)
Called pt to sch RFA. Lvm #1

## 2019-11-16 ENCOUNTER — Telehealth: Payer: Self-pay

## 2019-11-16 NOTE — Telephone Encounter (Signed)
Called pt to sch appt and lvm #1

## 2019-11-24 ENCOUNTER — Telehealth: Payer: Self-pay

## 2019-11-24 NOTE — Telephone Encounter (Signed)
Have called pt multi times to sch RFA That not req Auth# for insurance. Called pt and lvm #4.

## 2019-11-25 ENCOUNTER — Other Ambulatory Visit: Payer: Self-pay | Admitting: Physician Assistant

## 2019-11-28 ENCOUNTER — Other Ambulatory Visit: Payer: Self-pay | Admitting: Physical Medicine and Rehabilitation

## 2019-11-28 ENCOUNTER — Telehealth: Payer: Self-pay

## 2019-11-28 MED ORDER — BACLOFEN 10 MG PO TABS: 10.0000 mg | ORAL_TABLET | Freq: Three times a day (TID) | ORAL | 0 refills | Status: DC | PRN

## 2019-11-28 NOTE — Telephone Encounter (Signed)
Pt has requested Muscle Relaxer Rx.

## 2019-11-28 NOTE — Telephone Encounter (Signed)
Sent pt message about Rx request.

## 2019-11-28 NOTE — Telephone Encounter (Signed)
I did send in some Baclofen muscle relaxer. Does he need valium for RFA?

## 2019-11-28 NOTE — Progress Notes (Signed)
Muscle relaxer RX

## 2019-12-03 NOTE — Progress Notes (Signed)
[name]. - 69 y.o. male MRN [id number]  Date of birth: [birth year]  Office Visit Note: Visit Date: 10/31/2019 PCP: Danna Hefty, DO Referred by: Danna Hefty, DO  Subjective: Chief Complaint  Patient presents with  . Lower Back - Pain  . Middle Back - Pain   HPI:  Brandon [name]. is a 69 y.o. male who comes in today for planned repeat Bilateral L3-L4, L4-L5 and L5-S1 Lumbar facet/medial branch block with fluoroscopic guidance.  The patient has failed conservative care including home exercise, medications, time and activity modification.  This injection will be diagnostic and hopefully therapeutic.  Please see requesting physician notes for further details and justification.  Exam shows concordant low back pain with facet joint loading and extension. Patient received more than 80% pain relief from prior injection. This would be the second block in a diagnostic double block paradigm.     Referring:Dr. Laurence Spates   ROS Otherwise per HPI.  Assessment & Plan: Visit Diagnoses:  1. Spondylosis without myelopathy or radiculopathy, lumbar region   2. Chronic bilateral low back pain without sciatica     Plan: No additional findings.   Meds & Orders:  Meds ordered this encounter  Medications  . methylPREDNISolone acetate (DEPO-MEDROL) injection 80 mg    Orders Placed This Encounter  Procedures  . Facet Injection  . XR C-ARM NO REPORT    Follow-up: Return for Review Pain Diary.   Procedures: No procedures performed  Lumbar Diagnostic Facet Joint Nerve Block with Fluoroscopic Guidance   Patient: Brandon [name].      Date of Birth: [birth year] MRN: [id number] PCP: Danna Hefty, DO      Visit Date: 10/31/2019   Universal Protocol:    Date/Time: 11/28/215:14 PM  Consent Given By: the patient  Position: PRONE  Additional Comments: Vital signs were monitored before and after the procedure. Patient was prepped and draped in the usual sterile fashion. The  correct patient, procedure, and site was verified.   Injection Procedure Details:   Procedure diagnoses:  1. Spondylosis without myelopathy or radiculopathy, lumbar region   2. Chronic bilateral low back pain without sciatica      Meds Administered:  Meds ordered this encounter  Medications  . methylPREDNISolone acetate (DEPO-MEDROL) injection 80 mg     Laterality: Bilateral  Location/Site:  L3-L4 L4-L5 L5-S1  Needle: 5.0 in., 25 ga.  Short bevel or Quincke spinal needle  Needle Placement: Oblique pedical  Findings:   -Comments: There was excellent flow of contrast along the articular pillars without intravascular flow.  Procedure Details: The fluoroscope beam is vertically oriented in AP and then obliqued 15 to 20 degrees to the ipsilateral side of the desired nerve to achieve the "Scotty dog" appearance.  The skin over the target area of the junction of the superior articulating process and the transverse process (sacral ala if blocking the L5 dorsal rami) was locally anesthetized with a 1 ml volume of 1% Lidocaine without Epinephrine.  The spinal needle was inserted and advanced in a trajectory view down to the target.   After contact with periosteum and negative aspirate for blood and CSF, correct placement without intravascular or epidural spread was confirmed by injecting 0.5 ml. of Isovue-250.  A spot radiograph was obtained of this image.    Next, a 0.5 ml. volume of the injectate described above was injected. The needle was then redirected to the other facet joint nerves mentioned above if needed.  Prior to the procedure, the patient was given a Pain Diary which was completed for baseline measurements.  After the procedure, the patient rated their pain every 30 minutes and will continue rating at this frequency for a total of 5 hours.  The patient has been asked to complete the Diary and return to [place] by mail, fax or hand delivered as soon as possible.   Additional  Comments:  The patient tolerated the procedure well Dressing: 2 x 2 sterile gauze and Band-Aid    Post-procedure details: Patient was observed during the procedure. Post-procedure instructions were reviewed.  Patient left the clinic in stable condition.    Clinical History: Lumbar spine MRI dated 05/24/2017 by report from a [facility] Impression: : Interval increase in size of moderate right foraminal T12-L1 disc extrusion with mild cranial migration and resulting moderate severe right foraminal narrowing with some impingement and displacement of the exiting right T12 nerve root.  New left T11-12 synovial cyst with probable moderate spinal canal stenosis and some indenting of the left hemicord.  There is also moderate bilateral L3-4 foraminal narrowing     Objective:  VS:  HT:    WT:   BMI:     BP:123/79  HR:76bpm  TEMP: ( )  RESP:  Physical Exam Constitutional:      General: He is not in acute distress.    Appearance: Normal appearance. He is obese. He is not ill-appearing.  HENT:     Head: Normocephalic and atraumatic.     Right Ear: External ear normal.     Left Ear: External ear normal.  Eyes:     Extraocular Movements: Extraocular movements intact.  Cardiovascular:     Rate and Rhythm: Normal rate.     Pulses: Normal pulses.  Abdominal:     General: There is no distension.     Palpations: Abdomen is soft.  Musculoskeletal:        General: No tenderness or signs of injury.     Right lower leg: No edema.     Left lower leg: No edema.     Comments: Patient has good distal strength without clonus. Patient somewhat slow to rise from a seated position to full extension.  There is concordant low back pain with facet loading and lumbar spine extension rotation.  There are no definitive trigger points but the patient is somewhat tender across the lower back and PSIS.  There is no pain with hip rotation.   Skin:    Findings: No erythema or rash.  Neurological:      General: No focal deficit present.     Mental Status: He is alert and oriented to person, place, and time.     Sensory: No sensory deficit.     Motor: No weakness or abnormal muscle tone.     Coordination: Coordination normal.  Psychiatric:        Mood and Affect: Mood normal.        Behavior: Behavior normal.      Imaging: No results found.

## 2019-12-03 NOTE — Procedures (Signed)
Lumbar Diagnostic Facet Joint Nerve Block with Fluoroscopic Guidance   Patient: Brandon [name].      Date of Birth: 1950/06/09 MRN: [id number] PCP: Danna Hefty, DO      Visit Date: 10/31/2019   Universal Protocol:    Date/Time: 11/28/215:14 PM  Consent Given By: the patient  Position: PRONE  Additional Comments: Vital signs were monitored before and after the procedure. Patient was prepped and draped in the usual sterile fashion. The correct patient, procedure, and site was verified.   Injection Procedure Details:   Procedure diagnoses:  1. Spondylosis without myelopathy or radiculopathy, lumbar region   2. Chronic bilateral low back pain without sciatica      Meds Administered:  Meds ordered this encounter  Medications  . methylPREDNISolone acetate (DEPO-MEDROL) injection 80 mg     Laterality: Bilateral  Location/Site:  L3-L4 L4-L5 L5-S1  Needle: 5.0 in., 25 ga.  Short bevel or Quincke spinal needle  Needle Placement: Oblique pedical  Findings:   -Comments: There was excellent flow of contrast along the articular pillars without intravascular flow.  Procedure Details: The fluoroscope beam is vertically oriented in AP and then obliqued 15 to 20 degrees to the ipsilateral side of the desired nerve to achieve the "Scotty dog" appearance.  The skin over the target area of the junction of the superior articulating process and the transverse process (sacral ala if blocking the L5 dorsal rami) was locally anesthetized with a 1 ml volume of 1% Lidocaine without Epinephrine.  The spinal needle was inserted and advanced in a trajectory view down to the target.   After contact with periosteum and negative aspirate for blood and CSF, correct placement without intravascular or epidural spread was confirmed by injecting 0.5 ml. of Isovue-250.  A spot radiograph was obtained of this image.    Next, a 0.5 ml. volume of the injectate described above was injected. The needle  was then redirected to the other facet joint nerves mentioned above if needed.  Prior to the procedure, the patient was given a Pain Diary which was completed for baseline measurements.  After the procedure, the patient rated their pain every 30 minutes and will continue rating at this frequency for a total of 5 hours.  The patient has been asked to complete the Diary and return to [place] by mail, fax or hand delivered as soon as possible.   Additional Comments:  The patient tolerated the procedure well Dressing: 2 x 2 sterile gauze and Band-Aid    Post-procedure details: Patient was observed during the procedure. Post-procedure instructions were reviewed.  Patient left the clinic in stable condition.

## 2019-12-07 ENCOUNTER — Telehealth: Payer: Self-pay | Admitting: *Deleted

## 2019-12-07 NOTE — Telephone Encounter (Signed)
Received message from patient requesting a referral to urology in [place] system.  I called patient back but had to leave a voicemail.  Patient will need an appointment to discuss this as we have no recent notes or labs as to why a referral would be needed.  Also wanted to inform patient that [facility] is the only office currently in [place] and it is not [facility] [employer] [facility].  If/when patient calls back, please make him an appointment with pcp to discuss concerns for urology.  Eldrige Pitkin,CMA

## 2019-12-11 ENCOUNTER — Ambulatory Visit (INDEPENDENT_AMBULATORY_CARE_PROVIDER_SITE_OTHER): Payer: Medicare Other | Admitting: Internal Medicine

## 2019-12-11 ENCOUNTER — Encounter: Payer: Self-pay | Admitting: Internal Medicine

## 2019-12-11 ENCOUNTER — Other Ambulatory Visit: Payer: Self-pay

## 2019-12-11 VITALS — BP 110/68 | HR 73 | Ht 75.0 in | Wt 299.0 lb

## 2019-12-11 DIAGNOSIS — I471 Supraventricular tachycardia: Secondary | ICD-10-CM | POA: Diagnosis not present

## 2019-12-11 DIAGNOSIS — I480 Paroxysmal atrial fibrillation: Secondary | ICD-10-CM | POA: Diagnosis not present

## 2019-12-11 DIAGNOSIS — E782 Mixed hyperlipidemia: Secondary | ICD-10-CM | POA: Diagnosis not present

## 2019-12-11 DIAGNOSIS — I10 Essential (primary) hypertension: Secondary | ICD-10-CM

## 2019-12-11 NOTE — Patient Instructions (Signed)

## 2019-12-11 NOTE — Progress Notes (Signed)
HPI [name] returns today for followup. He is a pleasant 69 yo man with a h/o SVT and atrial fib, s/p ablation. He also has a h/o dyslipidemia and HTN. He has remained on an [place]. He has done well but still has not lost any weight. He has not had any palpitations or sustained SVT or atrial fib.  Allergies  Allergen Reactions  . Ace Inhibitors Cough  . Albuterol Other (See Comments)    Racing heart     Current Outpatient Medications  Medication Sig Dispense Refill  . amLODipine (NORVASC) 5 MG tablet Take 1 tablet (5 mg total) by mouth daily. 90 tablet 3  . atorvastatin (LIPITOR) 40 MG tablet TAKE 1 TABLET BY MOUTH EVERY DAY 90 tablet 3  . ATROVENT HFA 17 MCG/ACT inhaler TAKE 2 PUFFS BY MOUTH EVERY 6 HOURS AS NEEDED FOR WHEEZE 12.9 Inhaler 2  . baclofen (LIORESAL) 10 MG tablet Take 1 tablet (10 mg total) by mouth every 8 (eight) hours as needed for muscle spasms (Pain). 60 tablet 0  . carvedilol (COREG) 25 MG tablet TAKE 1 TABLET (25 MG TOTAL) BY MOUTH 2 (TWO) TIMES DAILY WITH A MEAL. 180 tablet 3  . diphenhydramine-acetaminophen (TYLENOL PM) 25-500 MG TABS tablet Take 3 tablets by mouth at bedtime as needed (sleep).    Marland Kitchen ELIQUIS 5 MG TABS tablet TAKE 1 TABLET BY MOUTH TWICE A DAY 60 tablet 5  . escitalopram (LEXAPRO) 20 MG tablet Take 1 tablet (20 mg total) by mouth daily. 90 tablet 3  . gabapentin (NEURONTIN) 800 MG tablet TAKE 1 TABLET IN THE EVENING AND 1 TABLET AT BEDTIME. 180 tablet 3  . losartan (COZAAR) 100 MG tablet TAKE 1 TABLET BY MOUTH EVERY DAY 90 tablet 3  . spironolactone (ALDACTONE) 25 MG tablet Take 1 tablet (25 mg total) by mouth daily. 90 tablet 1  . tamsulosin (FLOMAX) 0.4 MG CAPS capsule TAKE 1 CAPSULE (0.4 MG TOTAL) BY MOUTH 2 (TWO) TIMES DAILY. 180 capsule 3   No current facility-administered medications for this visit.     Past Medical History:  Diagnosis Date  . Anxiety   . Arthritis   . Benign neoplasm of colon   . Benign prostatic hyperplasia with  urinary obstruction   . Bilateral pulmonary embolism ([place]) 3/14   admitted to [facility],  treated with Xarelto  . Cataract   . Chronic pain   . History of kidney stones   . History of pulmonary embolism 03/19/2012  . Hyperlipemia   . Hypertension   . Insomnia   . Major depressive disorder, recurrent episode ([place])   . Mitral regurgitation 04/24/2013   Mild by TEE  . Obesity   . OSA (obstructive sleep apnea)    noncompliant with CPAP.  07/27/13- awaiting a CPAP- unable to tolerate mask  . Osteoporosis   . Persistent atrial fibrillation ([place])   . Restless leg syndrome    takes gabapentin  . Sciatica   . SVT (supraventricular tachycardia) ([place]) 03/07/2016  . Thrombus of left atrial appendage 03/09/2013  . Ventral hernia, unspecified, without mention of obstruction or gangrene    right abdominal wall    ROS:   All systems reviewed and negative except as noted in the HPI.   Past Surgical History:  Procedure Laterality Date  . CARDIOVERSION N/A 03/21/2012   Procedure: CARDIOVERSION;  Surgeon: Birdie Riddle, MD;  Location: [facility] ENDOSCOPY;  Service: Cardiovascular;  Laterality: N/A;  . CARDIOVERSION N/A 04/24/2013  Procedure: CARDIOVERSION;  Surgeon: Dorothy Spark, MD;  Location: [facility] ENDOSCOPY;  Service: Cardiovascular;  Laterality: N/A;  . CARDIOVERSION N/A 06/27/2015   Procedure: CARDIOVERSION;  Surgeon: Larey Dresser, MD;  Location: [place];  Service: Cardiovascular;  Laterality: N/A;  . CATARACT EXTRACTION    . COLONOSCOPY W/ POLYPECTOMY    . ELECTROPHYSIOLOGIC STUDY N/A 07/30/2015   Procedure: Atrial Fibrillation Ablation;  Surgeon: Thompson Grayer, MD;  Location: [place] CV LAB;  Service: Cardiovascular;  Laterality: N/A;  . EXTRACORPOREAL SHOCK WAVE LITHOTRIPSY Right 10/29/2016   Procedure: RIGHT EXTRACORPOREAL SHOCK WAVE LITHOTRIPSY (ESWL);  Surgeon: Alexis Frock, MD;  Location: [facility] ORS;  Service: Urology;  Laterality: Right;  . EYE SURGERY Right    cataract  .  FEMUR FRACTURE SURGERY    . HERNIA REPAIR  07/06/2011  . INGUINAL HERNIA REPAIR Right 07/28/2013   Procedure: RIGHT INGUINAL HERNIA REPAIR;  Surgeon: Imogene Burn. Georgette Dover, MD;  Location: [place];  Service: General;  Laterality: Right;  . INGUINAL HERNIA REPAIR Right 09/22/2016   Procedure: LAPAROSCOPIC RIGHT INGUINAL HERNIA;  Surgeon: Kinsinger, Arta Bruce, MD;  Location: [facility];  Service: General;  Laterality: Right;  With MESH  . INSERTION OF MESH Right 07/28/2013   Procedure: INSERTION OF MESH;  Surgeon: Imogene Burn. Georgette Dover, MD;  Location: [place];  Service: General;  Laterality: Right;  . KNEE ARTHROSCOPY     left  . REPLACEMENT TOTAL KNEE Left   . ROTATOR CUFF REPAIR     right  . ROTATOR CUFF REPAIR Left 03/08/2014   DR SUPPLE  . SHOULDER ARTHROSCOPY WITH ROTATOR CUFF REPAIR AND SUBACROMIAL DECOMPRESSION Left 03/08/2014   Procedure: LEFT SHOULDER ARTHROSCOPY WITH ROTATOR CUFF REPAIR/SUBACROMIAL DECOMPRESSION/DISTAL CLAVICLE RESECTION;  Surgeon: Marin Shutter, MD;  Location: [place];  Service: Orthopedics;  Laterality: Left;  . SVT ABLATION N/A 01/23/2019   Procedure: SVT ABLATION;  Surgeon: Evans Lance, MD;  Location: [place] CV LAB;  Service: Cardiovascular;  Laterality: N/A;  . TEE WITHOUT CARDIOVERSION N/A 03/21/2012   Procedure: TRANSESOPHAGEAL ECHOCARDIOGRAM (TEE);  Surgeon: Birdie Riddle, MD;  Location: [place];  Service: Cardiovascular;  Laterality: N/A;  . TEE WITHOUT CARDIOVERSION N/A 04/24/2013   Procedure: TRANSESOPHAGEAL ECHOCARDIOGRAM (TEE);  Surgeon: Dorothy Spark, MD;  Location: [place];  Service: Cardiovascular;  Laterality: N/A;  . TEE WITHOUT CARDIOVERSION N/A 07/29/2015   Procedure: TRANSESOPHAGEAL ECHOCARDIOGRAM (TEE);  Surgeon: Fay Records, MD;  Location: [facility] ENDOSCOPY;  Service: Cardiovascular;  Laterality: N/A;  . TONSILLECTOMY    . TOTAL KNEE ARTHROPLASTY Right 05/24/2018   Procedure: RIGHT TOTAL KNEE ARTHROPLASTY;  Surgeon: Paralee Cancel, MD;  Location[facility];   Service: Orthopedics;  Laterality: Right;  70 mins     Family History  Problem Relation Age of Onset  . Cancer Father        bone  . Heart failure Mother   . Hypertension Mother   . Asthma Mother   . Cancer Paternal Grandmother        ovarian  . CVA Other        Fam Hx of multiple myeloma  . Diabetes Other        Fam Hx of DM     Social History   Socioeconomic History  . Marital status: Married    Spouse name: Not on file  . Number of children: Not on file  . Years of education: Not on file  . Highest education level: Not on file  Occupational History  .  Not on file  Tobacco Use  . Smoking status: Former Smoker    Packs/day: 0.50    Years: 5.00    Pack years: 2.50    Types: Cigarettes    Quit date: 1976    Years since quitting: 45.9  . Smokeless tobacco: Never Used  Vaping Use  . Vaping Use: Never used  Substance and Sexual Activity  . Alcohol use: No    Comment: Former EtOH abuse, stopped 06/2016  . Drug use: No    Comment: negative hx for IV drug abuse  . Sexual activity: Not on file  Other Topics Concern  . Not on file  Social History Narrative   Lives with wife in [place]   Retired Dietitian   Wife lives in town (still married but lives separate)   Has biological daughter in [place] ([place])   Social Determinants of Health   Financial Resource Strain:   . Difficulty of Paying Living Expenses: Not on file  Food Insecurity:   . Worried About Charity fundraiser in the Last Year: Not on file  . Ran Out of Food in the Last Year: Not on file  Transportation Needs:   . Lack of Transportation (Medical): Not on file  . Lack of Transportation (Non-Medical): Not on file  Physical Activity:   . Days of Exercise per Week: Not on file  . Minutes of Exercise per Session: Not on file  Stress:   . Feeling of Stress : Not on file  Social Connections:   . Frequency of Communication with Friends and Family: Not on file  . Frequency of Social  Gatherings with Friends and Family: Not on file  . Attends Religious Services: Not on file  . Active Member of Clubs or Organizations: Not on file  . Attends Archivist Meetings: Not on file  . Marital Status: Not on file  Intimate Partner Violence:   . Fear of Current or Ex-Partner: Not on file  . Emotionally Abused: Not on file  . Physically Abused: Not on file  . Sexually Abused: Not on file     BP 110/68   Pulse 73   Ht 6' 3"  (1.905 m)   Wt 299 lb (135.6 kg)   SpO2 97%   BMI 37.37 kg/m   Physical Exam:  Well appearing NAD HEENT: Unremarkable Neck:  No JVD, no thyromegally Lymphatics:  No adenopathy Back:  No CVA tenderness Lungs:  Clear with no wheezes HEART:  Regular rate rhythm, no murmurs, no rubs, no clicks Abd:  soft, positive bowel sounds, no organomegally, no rebound, no guarding Ext:  2 plus pulses, no edema, no cyanosis, no clubbing Skin:  No rashes no nodules Neuro:  CN II through XII intact, motor grossly intact  EKG - nsr  Assess/Plan: 1. SVT - he is asymptomatic, s/p catheter ablation. 2. PAF - he has not had any additional symptoms. 3. Obesity - we discussed strategies to lose weight. 4. HTN - his bp is well controlled today. He will continue his current meds including coreg and amlodipine.  Carleene Overlie Darin Arndt,MD

## 2019-12-15 ENCOUNTER — Ambulatory Visit (INDEPENDENT_AMBULATORY_CARE_PROVIDER_SITE_OTHER): Payer: Medicare Other | Admitting: Physical Medicine and Rehabilitation

## 2019-12-15 ENCOUNTER — Other Ambulatory Visit: Payer: Self-pay

## 2019-12-15 ENCOUNTER — Encounter: Payer: Self-pay | Admitting: Physical Medicine and Rehabilitation

## 2019-12-15 DIAGNOSIS — R202 Paresthesia of skin: Secondary | ICD-10-CM

## 2019-12-15 NOTE — Progress Notes (Signed)
Numbness in hands. Loses grip when holding things. Sometimes depends on how elbow is resting.  Right hand dominant No lotion per patient Numeric Pain Rating Scale and Functional Assessment Average Pain 6   In the last MONTH (on 0-10 scale) has pain interfered with the following?  1. General activity like being  able to carry out your everyday physical activities such as walking, climbing stairs, carrying groceries, or moving a chair?  Rating(0)

## 2019-12-18 ENCOUNTER — Other Ambulatory Visit: Payer: Self-pay | Admitting: Physician Assistant

## 2019-12-29 ENCOUNTER — Other Ambulatory Visit: Payer: Self-pay | Admitting: Internal Medicine

## 2020-01-04 ENCOUNTER — Encounter: Payer: Self-pay | Admitting: Family Medicine

## 2020-01-11 ENCOUNTER — Ambulatory Visit (INDEPENDENT_AMBULATORY_CARE_PROVIDER_SITE_OTHER): Payer: Medicare Other | Admitting: Family Medicine

## 2020-01-11 ENCOUNTER — Other Ambulatory Visit: Payer: Self-pay

## 2020-01-11 VITALS — BP 115/70 | HR 88 | Ht 76.0 in | Wt 306.8 lb

## 2020-01-11 DIAGNOSIS — M7022 Olecranon bursitis, left elbow: Secondary | ICD-10-CM

## 2020-01-11 DIAGNOSIS — B999 Unspecified infectious disease: Secondary | ICD-10-CM | POA: Diagnosis not present

## 2020-01-11 NOTE — Progress Notes (Signed)
    SUBJECTIVE:   CHIEF COMPLAINT / HPI: Inflamed elbow  Mr. Covin is a 70 year old gentleman presenting for evaluation of left elbow inflammation/pain.  He reports this initially started insidiously approximately 1 month ago and has slowly progressed over this time.  He has noticed mild redness at his elbow that goes down his forearm for the past month it has not increased in severity since onset.  Additionally has had large swelling at his elbow joint.  He denies any associated weakness, numbness/tingling, or preceding trauma/injury.  He has no previous history of gout.  He otherwise has been feeling fine without any malaise or fever.  He ultimately decided to have it evaluated today after the past 2 days the swollen area started to feel harder.  Over the past month, he has been on the computer more often and resting his elbows on the table every time.  Otherwise, no repetitive motions or new exercise that he has started recently.  He has been trying to avoid direct pressure on his elbow but otherwise has not tried anything else for this.  He currently takes Eliquis for paroxysmal atrial fibrillation.  PERTINENT  PMH / PSH: Paroxysmal atrial fibrillation, hypertension, SVT, OSA, restless leg, anxiety, history of PE  OBJECTIVE:   BP 115/70   Pulse 88   Ht 6\' 4"  (1.93 m)   Wt (!) 306 lb 12.8 oz (139.2 kg)   SpO2 94%   BMI 37.34 kg/m   General: Alert, NAD HEENT: NCAT, MMM Cardiac: RRR no m/g/r Lungs: Clear bilaterally, no increased WOB  Msk: Mild tenderness with palpation along left olecranon, can palpate soft mildly fluctuant area/swelling overlying olecranon bursa on the left.  Mild poorly demarcated erythema present overlying olecranon and posterior forearm.  He is able to fully flex/extend his elbow without pain. Ext: Warm, dry, 2+ radial pulses bilaterally    Elbow bursa aspiration: Written consent was obtained after discussing risk and benefits associated with procedure.  Timeout  was conducted.  His elbows was cleaned with Betadine solution X3.  Sterile gloves and procedure used.  Then with the assistance of cryo spray, used a anterior approach with a 22-gauge needle for aspiration.  Unfortunately, no fluid was retrieved with aspiration despite varying location of needle placement.  He tolerated the procedure well without any immediate complications.  The area was cleaned once again with alcohol swabs and placed gauze/Coban dressing.  ASSESSMENT/PLAN:   Olecranon bursitis Subacute presentation and clinical exam consistent with olecranon bursitis.  Suspect likely in the setting of prolonged pressure/overuse given history provided.  Attempted aspiration to rule out gouty arthritis versus infection, however unable to retrieve fluid (bursa may be starting to thicken).  Fortunately suspicion for both of these is low given the duration of symptoms with minimal pain and erythema, however will obtain CBC/uric acid/BMP to assist.  Recommended wearing elbow brace with compression, ice, and Tylenol PRN.  Avoid prolonged pressure if possible.    Follow-up if not improving in the next few weeks or sooner if worsening, counseled on common s/sx of infection including worsening erythema, swelling, fever, malaise, purulent drainage from site.  , DO [place] [facility]

## 2020-01-11 NOTE — Patient Instructions (Signed)
It was wonderful to see you today.  Please try to avoid any repetitive motion or consistently having pressure on your elbow such as at the computer.  Can take Tylenol as needed for discomfort.  Please try to ice this area 20 minutes at a time as needed.  It is important to have some compression on this area, you can use an Ace bandage to wrap the elbow or can also purchase an elbow brace with an orthosis at the elbow pad specifically.  We will get some labs today to make sure there is no abnormal signs of infection or gout.  If you notice this is getting worse including worsening swelling, redness, feeling unwell, or fever please follow-up.

## 2020-01-12 ENCOUNTER — Encounter: Payer: Self-pay | Admitting: Family Medicine

## 2020-01-12 DIAGNOSIS — M7020 Olecranon bursitis, unspecified elbow: Secondary | ICD-10-CM | POA: Insufficient documentation

## 2020-01-12 LAB — URIC ACID: Uric Acid: 4.8 mg/dL (ref 3.8–8.4)

## 2020-01-12 LAB — BASIC METABOLIC PANEL
BUN/Creatinine Ratio: 16 (ref 10–24)
BUN: 14 mg/dL (ref 8–27)
CO2: 29 mmol/L (ref 20–29)
Calcium: 9.4 mg/dL (ref 8.6–10.2)
Chloride: 100 mmol/L (ref 96–106)
Creatinine, Ser: 0.87 mg/dL (ref 0.76–1.27)
GFR calc Af Amer: 100 mL/min/{1.73_m2} (ref >59)
GFR calc non Af Amer: 87 mL/min/{1.73_m2} (ref >59)
Glucose: 91 mg/dL (ref 65–99)
Potassium: 3.3 mmol/L — ABNORMAL LOW (ref 3.5–5.2)
Sodium: 144 mmol/L (ref 134–144)

## 2020-01-12 LAB — CBC WITH DIFFERENTIAL/PLATELET
Basophils Absolute: 0 10*3/uL (ref 0.0–0.2)
Basos: 0 %
EOS (ABSOLUTE): 0.4 10*3/uL (ref 0.0–0.4)
Eos: 4 %
Hematocrit: 41.7 % (ref 37.5–51.0)
Hemoglobin: 13.7 g/dL (ref 13.0–17.7)
Immature Grans (Abs): 0 10*3/uL (ref 0.0–0.1)
Immature Granulocytes: 0 %
Lymphocytes Absolute: 1.8 10*3/uL (ref 0.7–3.1)
Lymphs: 18 %
MCH: 29.9 pg (ref 26.6–33.0)
MCHC: 32.9 g/dL (ref 31.5–35.7)
MCV: 91 fL (ref 79–97)
Monocytes Absolute: 1 10*3/uL — ABNORMAL HIGH (ref 0.1–0.9)
Monocytes: 10 %
Neutrophils Absolute: 6.7 10*3/uL (ref 1.4–7.0)
Neutrophils: 68 %
Platelets: 189 10*3/uL (ref 150–450)
RBC: 4.58 x10E6/uL (ref 4.14–5.80)
RDW: 13 % (ref 11.6–15.4)
WBC: 9.9 10*3/uL (ref 3.4–10.8)

## 2020-01-12 NOTE — Assessment & Plan Note (Addendum)
Subacute presentation and clinical exam consistent with olecranon bursitis.  Suspect likely in the setting of prolonged pressure/overuse given history provided.  Attempted aspiration to rule out gouty arthritis versus infection, however unable to retrieve fluid (bursa may be starting to thicken).  Fortunately suspicion for both of these is low given the duration of symptoms with minimal pain and erythema, however will obtain CBC/uric acid/BMP to assist.  Recommended wearing elbow brace with compression, ice, and Tylenol PRN.  Avoid prolonged pressure if possible.

## 2020-01-15 ENCOUNTER — Encounter: Payer: Self-pay | Admitting: Physical Medicine and Rehabilitation

## 2020-01-15 ENCOUNTER — Telehealth: Payer: Self-pay | Admitting: Physical Medicine and Rehabilitation

## 2020-01-15 NOTE — Telephone Encounter (Signed)
Patient called needing to cancel both of his appointments. The number to  Contact patient is [phone]

## 2020-01-29 ENCOUNTER — Ambulatory Visit (INDEPENDENT_AMBULATORY_CARE_PROVIDER_SITE_OTHER): Payer: Medicare Other | Admitting: Physical Medicine and Rehabilitation

## 2020-01-29 ENCOUNTER — Other Ambulatory Visit: Payer: Self-pay

## 2020-01-29 ENCOUNTER — Encounter: Payer: Self-pay | Admitting: Physical Medicine and Rehabilitation

## 2020-01-29 ENCOUNTER — Ambulatory Visit: Payer: Self-pay

## 2020-01-29 VITALS — BP 137/94 | HR 81

## 2020-01-29 DIAGNOSIS — M5416 Radiculopathy, lumbar region: Secondary | ICD-10-CM | POA: Diagnosis not present

## 2020-01-29 MED ORDER — METHYLPREDNISOLONE ACETATE 80 MG/ML IJ SUSP
80.0000 mg | Freq: Once | INTRAMUSCULAR | Status: AC
  Administered 2020-01-29: 80 mg

## 2020-01-29 NOTE — Patient Instructions (Signed)

## 2020-01-29 NOTE — Progress Notes (Signed)
Pt state lower back pain. Pt state walking makes the pain worse. Pt state he takes pain meds to help ease the pain. Pt has hx of inj on 10/31/19 pt state it took a few days to work.   Numeric Pain Rating Scale and Functional Assessment Average Pain 6   In the last MONTH (on 0-10 scale) has pain interfered with the following?  1. General activity like being  able to carry out your everyday physical activities such as walking, climbing stairs, carrying groceries, or moving a chair?  Rating(10)   +Driver, +BT, -Dye Allergies.

## 2020-02-05 ENCOUNTER — Encounter: Payer: Medicare Other | Admitting: Physical Medicine and Rehabilitation

## 2020-02-14 NOTE — Progress Notes (Signed)
[name] - 70 y.o. male MRN [id number]  Date of birth: 01/26/1950  Office Visit Note: Visit Date: 12/15/2019 PCP: Danna Hefty, DO Referred by: Danna Hefty, DO  Subjective: Chief Complaint  Patient presents with  . Right Hand - Numbness  . Left Hand - Numbness   HPI:  Brandon [name]. is a 70 y.o. male who comes in today Planned electrodiagnostic study of both upper limbs.  Patient is right-hand dominant.  Please see our prior notes for further details and justification.  We have been seeing him lately for his back pain.  He reports a chronic long-term issue with his hands.  Somewhat more radial nerve distribution somewhat nondermatomal.  No frank radicular symptoms.  Does not carry a diagnosis of diabetes and he does not take any diabetes medication but he does have borderline high blood sugars on all of his metabolic panels over the last few years.  No prior electrodiagnostic studies.  ROS Otherwise per HPI.  Assessment & Plan: Visit Diagnoses:    ICD-10-CM   1. Paresthesia of skin  R20.2 NCV with EMG (electromyography)    Plan: Impression: The above electrodiagnostic study is ABNORMAL and reveals evidence of a mild to moderate bilateral median nerve entrapment at the wrist (carpal tunnel syndrome) affecting sensory components.   There is no significant electrodiagnostic evidence of any other focal nerve entrapment, brachial plexopathy or cervical radiculopathy.   Recommendations: 1.  Follow-up with referring physician. 2.  Continue current management of symptoms. 3.  Continue use of resting splint at night-time and as needed during the day.  Meds & Orders: No orders of the defined types were placed in this encounter.   Orders Placed This Encounter  Procedures  . NCV with EMG (electromyography)    Follow-up: Return if symptoms worsen or fail to improve.   Procedures: No procedures performed  EMG & NCV Findings: Evaluation of the right median motor nerve  showed decreased conduction velocity (Elbow-Wrist, 48 m/s).  The left median (across palm) sensory nerve showed prolonged distal peak latency (Wrist, 4.0 ms) and reduced amplitude (7.8 V).  The right median (across palm) sensory nerve showed prolonged distal peak latency (Wrist, 4.3 ms) and prolonged distal peak latency (Palm, 2.3 ms).  All remaining nerves (as indicated in the following tables) were within normal limits.  All left vs. right side differences were within normal limits.    All examined muscles (as indicated in the following table) showed no evidence of electrical instability.    Impression: The above electrodiagnostic study is ABNORMAL and reveals evidence of a mild to moderate bilateral median nerve entrapment at the wrist (carpal tunnel syndrome) affecting sensory components.   There is no significant electrodiagnostic evidence of any other focal nerve entrapment, brachial plexopathy or cervical radiculopathy.   Recommendations: 1.  Follow-up with referring physician. 2.  Continue current management of symptoms. 3.  Continue use of resting splint at night-time and as needed during the day.  ___________________________ Wonda Olds Board Certified, American Board of Physical Medicine and Rehabilitation    Nerve Conduction Studies Anti Sensory Summary Table   Stim Site NR Peak (ms) Norm Peak (ms) P-T Amp (V) Norm P-T Amp Site1 Site2 Delta-P (ms) Dist (cm) Vel (m/s) Norm Vel (m/s)  Left Median Acr Palm Anti Sensory (2nd Digit)  34.1C  Wrist    *4.0 <3.6 *7.8 >10 Wrist Palm 2.2 0.0    Palm    1.8 <2.0 5.2  Right Median Acr Palm Anti Sensory (2nd Digit)  33.6C  Wrist    *4.3 <3.6 14.5 >10 Wrist Palm 2.0 0.0    Palm    *2.3 <2.0 3.4         Right Radial Anti Sensory (Base 1st Digit)  35.1C  Wrist    2.2 <3.1 10.8  Wrist Base 1st Digit 2.2 0.0    Right Ulnar Anti Sensory (5th Digit)  34.2C  Wrist    3.4 <3.7 17.8 >15.0 Wrist 5th Digit 3.4 14.0 41 >38    Motor Summary Table   Stim Site NR Onset (ms) Norm Onset (ms) O-P Amp (mV) Norm O-P Amp Site1 Site2 Delta-0 (ms) Dist (cm) Vel (m/s) Norm Vel (m/s)  Left Median Motor (Abd Poll Brev)  34.5C  Wrist    4.1 <4.2 6.8 >5 Elbow Wrist 4.9 24.5 50 >50  Elbow    9.0  3.8         Right Median Motor (Abd Poll Brev)  34.9C  Wrist    3.8 <4.2 7.0 >5 Elbow Wrist 5.3 25.5 *48 >50  Elbow    9.1  1.2         Right Ulnar Motor (Abd Dig Min)  34.7C  Wrist    3.2 <4.2 7.3 >3 B Elbow Wrist 4.1 25.0 61 >53  B Elbow    7.3  7.1  A Elbow B Elbow 1.6 11.0 69 >53  A Elbow    8.9  4.2          EMG   Side Muscle Nerve Root Ins Act Fibs Psw Amp Dur Poly Recrt Int Fraser Din Comment  Right Abd Poll Brev Median C8-T1 Nml Nml Nml Nml Nml 0 Nml Nml   Right 1stDorInt Ulnar C8-T1 Nml Nml Nml Nml Nml 0 Nml Nml   Right PronatorTeres Median C6-7 Nml Nml Nml Nml Nml 0 Nml Nml   Right Biceps Musculocut C5-6 Nml Nml Nml Nml Nml 0 Nml Nml   Right Deltoid Axillary C5-6 Nml Nml Nml Nml Nml 0 Nml Nml     Nerve Conduction Studies Anti Sensory Left/Right Comparison   Stim Site L Lat (ms) R Lat (ms) L-R Lat (ms) L Amp (V) R Amp (V) L-R Amp (%) Site1 Site2 L Vel (m/s) R Vel (m/s) L-R Vel (m/s)  Median Acr Palm Anti Sensory (2nd Digit)  34.1C  Wrist *4.0 *4.3 0.3 *7.8 14.5 46.2 Wrist Palm     Palm 1.8 *2.3 0.5 5.2 3.4 34.6       Radial Anti Sensory (Base 1st Digit)  35.1C  Wrist  2.2   10.8  Wrist Base 1st Digit     Ulnar Anti Sensory (5th Digit)  34.2C  Wrist  3.4   17.8  Wrist 5th Digit  41    Motor Left/Right Comparison   Stim Site L Lat (ms) R Lat (ms) L-R Lat (ms) L Amp (mV) R Amp (mV) L-R Amp (%) Site1 Site2 L Vel (m/s) R Vel (m/s) L-R Vel (m/s)  Median Motor (Abd Poll Brev)  34.5C  Wrist 4.1 3.8 0.3 6.8 7.0 2.9 Elbow Wrist 50 *48 2  Elbow 9.0 9.1 0.1 3.8 1.2 68.4       Ulnar Motor (Abd Dig Min)  34.7C  Wrist  3.2   7.3  B Elbow Wrist  61   B Elbow  7.3   7.1  A Elbow B Elbow  69   A Elbow  8.9   4.2  Waveforms:                 Clinical History: Lumbar spine MRI dated 05/24/2017 by report from a [facility] Impression: : Interval increase in size of moderate right foraminal T12-L1 disc extrusion with mild cranial migration and resulting moderate severe right foraminal narrowing with some impingement and displacement of the exiting right T12 nerve root.  New left T11-12 synovial cyst with probable moderate spinal canal stenosis and some indenting of the left hemicord.  There is also moderate bilateral L3-4 foraminal narrowing     Objective:  VS:  HT:    WT:   BMI:     BP:   HR: bpm  TEMP: ( )  RESP:  Physical Exam Constitutional:      Appearance: He is obese.  Musculoskeletal:        General: No tenderness.     Comments: Inspection reveals no atrophy of the bilateral APB or FDI or hand intrinsics. There is no swelling, color changes, allodynia or dystrophic changes. There is 5 out of 5 strength in the bilateral wrist extension, finger abduction and long finger flexion. There is intact sensation to light touch in all dermatomal and peripheral nerve distributions.  There is a equivocally Phalen's test bilaterally. There is a negative Hoffmann's test bilaterally.  Skin:    General: Skin is warm and dry.     Findings: No erythema or rash.  Neurological:     General: No focal deficit present.     Mental Status: He is alert and oriented to person, place, and time.     Sensory: No sensory deficit.     Motor: No weakness or abnormal muscle tone.     Coordination: Coordination normal.     Gait: Gait normal.  Psychiatric:        Mood and Affect: Mood normal.        Behavior: Behavior normal.        Thought Content: Thought content normal.      Imaging: No results found.

## 2020-02-19 NOTE — Procedures (Signed)
EMG & NCV Findings: Evaluation of the right median motor nerve showed decreased conduction velocity (Elbow-Wrist, 48 m/s).  The left median (across palm) sensory nerve showed prolonged distal peak latency (Wrist, 4.0 ms) and reduced amplitude (7.8 V).  The right median (across palm) sensory nerve showed prolonged distal peak latency (Wrist, 4.3 ms) and prolonged distal peak latency (Palm, 2.3 ms).  All remaining nerves (as indicated in the following tables) were within normal limits.  All left vs. right side differences were within normal limits.    All examined muscles (as indicated in the following table) showed no evidence of electrical instability.    Impression: The above electrodiagnostic study is ABNORMAL and reveals evidence of a mild to moderate bilateral median nerve entrapment at the wrist (carpal tunnel syndrome) affecting sensory components.   There is no significant electrodiagnostic evidence of any other focal nerve entrapment, brachial plexopathy or cervical radiculopathy.   Recommendations: 1.  Follow-up with referring physician. 2.  Continue current management of symptoms. 3.  Continue use of resting splint at night-time and as needed during the day.  ___________________________ Wonda Olds Board Certified, American Board of Physical Medicine and Rehabilitation    Nerve Conduction Studies Anti Sensory Summary Table   Stim Site NR Peak (ms) Norm Peak (ms) P-T Amp (V) Norm P-T Amp Site1 Site2 Delta-P (ms) Dist (cm) Vel (m/s) Norm Vel (m/s)  Left Median Acr Palm Anti Sensory (2nd Digit)  34.1C  Wrist    *4.0 <3.6 *7.8 >10 Wrist Palm 2.2 0.0    Palm    1.8 <2.0 5.2         Right Median Acr Palm Anti Sensory (2nd Digit)  33.6C  Wrist    *4.3 <3.6 14.5 >10 Wrist Palm 2.0 0.0    Palm    *2.3 <2.0 3.4         Right Radial Anti Sensory (Base 1st Digit)  35.1C  Wrist    2.2 <3.1 10.8  Wrist Base 1st Digit 2.2 0.0    Right Ulnar Anti Sensory (5th Digit)  34.2C   Wrist    3.4 <3.7 17.8 >15.0 Wrist 5th Digit 3.4 14.0 41 >38   Motor Summary Table   Stim Site NR Onset (ms) Norm Onset (ms) O-P Amp (mV) Norm O-P Amp Site1 Site2 Delta-0 (ms) Dist (cm) Vel (m/s) Norm Vel (m/s)  Left Median Motor (Abd Poll Brev)  34.5C  Wrist    4.1 <4.2 6.8 >5 Elbow Wrist 4.9 24.5 50 >50  Elbow    9.0  3.8         Right Median Motor (Abd Poll Brev)  34.9C  Wrist    3.8 <4.2 7.0 >5 Elbow Wrist 5.3 25.5 *48 >50  Elbow    9.1  1.2         Right Ulnar Motor (Abd Dig Min)  34.7C  Wrist    3.2 <4.2 7.3 >3 B Elbow Wrist 4.1 25.0 61 >53  B Elbow    7.3  7.1  A Elbow B Elbow 1.6 11.0 69 >53  A Elbow    8.9  4.2          EMG   Side Muscle Nerve Root Ins Act Fibs Psw Amp Dur Poly Recrt Int Fraser Din Comment  Right Abd Poll Brev Median C8-T1 Nml Nml Nml Nml Nml 0 Nml Nml   Right 1stDorInt Ulnar C8-T1 Nml Nml Nml Nml Nml 0 Nml Nml   Right PronatorTeres Median C6-7 Nml Nml Nml  Nml Nml 0 Nml Nml   Right Biceps Musculocut C5-6 Nml Nml Nml Nml Nml 0 Nml Nml   Right Deltoid Axillary C5-6 Nml Nml Nml Nml Nml 0 Nml Nml     Nerve Conduction Studies Anti Sensory Left/Right Comparison   Stim Site L Lat (ms) R Lat (ms) L-R Lat (ms) L Amp (V) R Amp (V) L-R Amp (%) Site1 Site2 L Vel (m/s) R Vel (m/s) L-R Vel (m/s)  Median Acr Palm Anti Sensory (2nd Digit)  34.1C  Wrist *4.0 *4.3 0.3 *7.8 14.5 46.2 Wrist Palm     Palm 1.8 *2.3 0.5 5.2 3.4 34.6       Radial Anti Sensory (Base 1st Digit)  35.1C  Wrist  2.2   10.8  Wrist Base 1st Digit     Ulnar Anti Sensory (5th Digit)  34.2C  Wrist  3.4   17.8  Wrist 5th Digit  41    Motor Left/Right Comparison   Stim Site L Lat (ms) R Lat (ms) L-R Lat (ms) L Amp (mV) R Amp (mV) L-R Amp (%) Site1 Site2 L Vel (m/s) R Vel (m/s) L-R Vel (m/s)  Median Motor (Abd Poll Brev)  34.5C  Wrist 4.1 3.8 0.3 6.8 7.0 2.9 Elbow Wrist 50 *48 2  Elbow 9.0 9.1 0.1 3.8 1.2 68.4       Ulnar Motor (Abd Dig Min)  34.7C  Wrist  3.2   7.3  B Elbow Wrist  61   B  Elbow  7.3   7.1  A Elbow B Elbow  69   A Elbow  8.9   4.2           Waveforms:

## 2020-02-26 ENCOUNTER — Telehealth: Payer: Self-pay | Admitting: Pharmacist

## 2020-02-26 DIAGNOSIS — I10 Essential (primary) hypertension: Secondary | ICD-10-CM

## 2020-02-26 MED ORDER — IRBESARTAN 300 MG PO TABS: 300.0000 mg | ORAL_TABLET | Freq: Every day | ORAL | 1 refills | Status: DC

## 2020-02-26 NOTE — Procedures (Signed)
Lumbosacral Transforaminal Epidural Steroid Injection - Sub-Pedicular Approach with Fluoroscopic Guidance  Patient: Brandon [name].      Date of Birth: 18-Nov-1950 MRN: [id number] PCP: Danna Hefty, DO      Visit Date: 01/29/2020   Universal Protocol:    Date/Time: 01/29/2020  Consent Given By: the patient  Position: PRONE  Additional Comments: Vital signs were monitored before and after the procedure. Patient was prepped and draped in the usual sterile fashion. The correct patient, procedure, and site was verified.   Injection Procedure Details:   Procedure diagnoses: Lumbar radiculopathy [M54.16]    Meds Administered:  Meds ordered this encounter  Medications  . methylPREDNISolone acetate (DEPO-MEDROL) injection 80 mg    Laterality: Bilateral  Location/Site:  L4-L5  Needle:5.0 in., 22 ga.  Short bevel or Quincke spinal needle  Needle Placement: Transforaminal  Findings:    -Comments: Excellent flow of contrast along the nerve, nerve root and into the epidural space.  Procedure Details: After squaring off the end-plates to get a true AP view, the C-arm was positioned so that an oblique view of the foramen as noted above was visualized. The target area is just inferior to the "nose of the scotty dog" or sub pedicular. The soft tissues overlying this structure were infiltrated with 2-3 ml. of 1% Lidocaine without Epinephrine.  The spinal needle was inserted toward the target using a "trajectory" view along the fluoroscope beam.  Under AP and lateral visualization, the needle was advanced so it did not puncture dura and was located close the 6 O'Clock position of the pedical in AP tracterory. Biplanar projections were used to confirm position. Aspiration was confirmed to be negative for CSF and/or blood. A 1-2 ml. volume of Isovue-250 was injected and flow of contrast was noted at each level. Radiographs were obtained for documentation purposes.   After attaining the  desired flow of contrast documented above, a 0.5 to 1.0 ml test dose of 0.25% Marcaine was injected into each respective transforaminal space.  The patient was observed for 90 seconds post injection.  After no sensory deficits were reported, and normal lower extremity motor function was noted,   the above injectate was administered so that equal amounts of the injectate were placed at each foramen (level) into the transforaminal epidural space.   Additional Comments:  The patient tolerated the procedure well Dressing: 2 x 2 sterile gauze and Band-Aid    Post-procedure details: Patient was observed during the procedure. Post-procedure instructions were reviewed.  Patient left the clinic in stable condition.

## 2020-02-26 NOTE — Telephone Encounter (Signed)
Received notice from [facility] that patient's losartan can not be filled due to backorder.  Will change to irbesartan

## 2020-02-26 NOTE — Progress Notes (Signed)
Brandon [name]. - 70 y.o. male MRN [id number]  Date of birth: 06/24/1950  Office Visit Note: Visit Date: 01/29/2020 PCP: Brandon Hefty, DO Referred by: Brandon Hefty, DO  Subjective: Chief Complaint  Patient presents with  . Lower Back - Pain   HPI:  Brandon [name]. is a 70 y.o. male who comes in today Bilateral L4-L5 Lumbar epidural steroid injection with fluoroscopic guidance.  The patient has failed conservative care including home exercise, medications, time and activity modification.  This injection will be diagnostic and hopefully therapeutic.  Please see requesting physician notes for further details and justification.   ROS Otherwise per HPI.  Assessment & Plan: Visit Diagnoses:    ICD-10-CM   1. Lumbar radiculopathy  M54.16 XR C-ARM NO REPORT    Epidural Steroid injection    methylPREDNISolone acetate (DEPO-MEDROL) injection 80 mg    Plan: No additional findings.   Meds & Orders:  Meds ordered this encounter  Medications  . methylPREDNISolone acetate (DEPO-MEDROL) injection 80 mg    Orders Placed This Encounter  Procedures  . XR C-ARM NO REPORT  . Epidural Steroid injection    Follow-up: Return if symptoms worsen or fail to improve.   Procedures: No procedures performed  Lumbosacral Transforaminal Epidural Steroid Injection - Sub-Pedicular Approach with Fluoroscopic Guidance  Patient: Brandon [name].      Date of Birth: 10-13-1950 MRN: [id number] PCP: Brandon Hefty, DO      Visit Date: 01/29/2020   Universal Protocol:    Date/Time: 01/29/2020  Consent Given By: the patient  Position: PRONE  Additional Comments: Vital signs were monitored before and after the procedure. Patient was prepped and draped in the usual sterile fashion. The correct patient, procedure, and site was verified.   Injection Procedure Details:   Procedure diagnoses: Lumbar radiculopathy [M54.16]    Meds Administered:  Meds ordered this encounter  Medications   . methylPREDNISolone acetate (DEPO-MEDROL) injection 80 mg    Laterality: Bilateral  Location/Site:  L4-L5  Needle:5.0 in., 22 ga.  Short bevel or Quincke spinal needle  Needle Placement: Transforaminal  Findings:    -Comments: Excellent flow of contrast along the nerve, nerve root and into the epidural space.  Procedure Details: After squaring off the end-plates to get a true AP view, the C-arm was positioned so that an oblique view of the foramen as noted above was visualized. The target area is just inferior to the "nose of the scotty dog" or sub pedicular. The soft tissues overlying this structure were infiltrated with 2-3 ml. of 1% Lidocaine without Epinephrine.  The spinal needle was inserted toward the target using a "trajectory" view along the fluoroscope beam.  Under AP and lateral visualization, the needle was advanced so it did not puncture dura and was located close the 6 O'Clock position of the pedical in AP tracterory. Biplanar projections were used to confirm position. Aspiration was confirmed to be negative for CSF and/or blood. A 1-2 ml. volume of Isovue-250 was injected and flow of contrast was noted at each level. Radiographs were obtained for documentation purposes.   After attaining the desired flow of contrast documented above, a 0.5 to 1.0 ml test dose of 0.25% Marcaine was injected into each respective transforaminal space.  The patient was observed for 90 seconds post injection.  After no sensory deficits were reported, and normal lower extremity motor function was noted,   the above injectate was administered so that equal amounts of the  injectate were placed at each foramen (level) into the transforaminal epidural space.   Additional Comments:  The patient tolerated the procedure well Dressing: 2 x 2 sterile gauze and Band-Aid    Post-procedure details: Patient was observed during the procedure. Post-procedure instructions were reviewed.  Patient left the  clinic in stable condition.      Clinical History: Lumbar spine MRI dated 05/24/2017 by report from a [facility] Impression: : Interval increase in size of moderate right foraminal T12-L1 disc extrusion with mild cranial migration and resulting moderate severe right foraminal narrowing with some impingement and displacement of the exiting right T12 nerve root.  New left T11-12 synovial cyst with probable moderate spinal canal stenosis and some indenting of the left hemicord.  There is also moderate bilateral L3-4 foraminal narrowing     Objective:  VS:  HT:    WT:   BMI:     BP:(!) 137/94  HR:81bpm  TEMP: ( )  RESP:  Physical Exam Vitals and nursing note reviewed.  Constitutional:      General: He is not in acute distress.    Appearance: Normal appearance. He is obese. He is not ill-appearing.  HENT:     Head: Normocephalic and atraumatic.     Right Ear: External ear normal.     Left Ear: External ear normal.     Nose: No congestion.  Eyes:     Extraocular Movements: Extraocular movements intact.  Cardiovascular:     Rate and Rhythm: Normal rate.     Pulses: Normal pulses.  Pulmonary:     Effort: Pulmonary effort is normal. No respiratory distress.  Abdominal:     General: There is no distension.     Palpations: Abdomen is soft.  Musculoskeletal:        General: No tenderness or signs of injury.     Cervical back: Neck supple.     Right lower leg: No edema.     Left lower leg: No edema.     Comments: Patient has good distal strength without clonus.  Skin:    Findings: No erythema or rash.  Neurological:     General: No focal deficit present.     Mental Status: He is alert and oriented to person, place, and time.     Sensory: No sensory deficit.     Motor: No weakness or abnormal muscle tone.     Coordination: Coordination normal.  Psychiatric:        Mood and Affect: Mood normal.        Behavior: Behavior normal.      Imaging: No results found.

## 2020-03-05 ENCOUNTER — Other Ambulatory Visit: Payer: Self-pay

## 2020-03-05 ENCOUNTER — Ambulatory Visit: Payer: Self-pay

## 2020-03-05 ENCOUNTER — Encounter: Payer: Self-pay | Admitting: Physical Medicine and Rehabilitation

## 2020-03-05 ENCOUNTER — Ambulatory Visit (INDEPENDENT_AMBULATORY_CARE_PROVIDER_SITE_OTHER): Payer: Medicare Other | Admitting: Physical Medicine and Rehabilitation

## 2020-03-05 VITALS — BP 141/82 | HR 66

## 2020-03-05 DIAGNOSIS — M47816 Spondylosis without myelopathy or radiculopathy, lumbar region: Secondary | ICD-10-CM | POA: Diagnosis not present

## 2020-03-05 MED ORDER — BETAMETHASONE SOD PHOS & ACET 6 (3-3) MG/ML IJ SUSP
12.0000 mg | Freq: Once | INTRAMUSCULAR | Status: AC
  Administered 2020-03-05: 12 mg

## 2020-03-05 NOTE — Progress Notes (Signed)
Pt state lower back pain. Pt state walking and standing makes his pain worse. Pt state he will sit down to ease his pain. Pt has hx of inj on 01/29/20 pt state it worked not has better as the last inj.  Numeric Pain Rating Scale and Functional Assessment Average Pain 7   In the last MONTH (on 0-10 scale) has pain interfered with the following?  1. General activity like being  able to carry out your everyday physical activities such as walking, climbing stairs, carrying groceries, or moving a chair?  Rating(7)   +Driver, +BT, -Dye Allergies.

## 2020-03-06 NOTE — Progress Notes (Signed)
[name]. - 70 y.o. male MRN [id number]  Date of birth: 30-Mar-1950  Office Visit Note: Visit Date: 03/05/2020 PCP: Danna Hefty, DO Referred by: Danna Hefty, DO  Subjective: Chief Complaint  Patient presents with  . Lower Back - Pain   HPI:  Brandon [name]. is a 70 y.o. male who comes in today for planned radiofrequency ablation of the Right L3-L4, L4-L5 and L5-S1 Lumbar facet joints. This would be ablation of the corresponding medial branches and/or dorsal rami.  Patient has had double diagnostic blocks with more than 50% relief.  These are documented on pain diary.  They have had chronic back pain for quite some time, more than 3 months, which has been an ongoing situation with recalcitrant axial back pain.  They have no radicular pain.  Their axial pain is worse with standing and ambulating and on exam today with facet loading.  They have had physical therapy as well as home exercise program.  The imaging noted in the chart below indicated facet pathology. Accordingly they meet all the criteria and qualification for for radiofrequency ablation and we are going to complete this today hopefully for more longer term relief as part of comprehensive management program.  Patient's notes can be fully reviewed but just as a factual manner between working him up with diagnostic medial branch blocks he had an episode of left hip and leg pain which was not something we originally saw him with.  We did complete an epidural injection diagnostically and that has helped him quite a bit from that hip and thigh pain standpoint and is not having that today.  He continues with his axial back pain that we have been dealing with chronically.  ROS Otherwise per HPI.  Assessment & Plan: Visit Diagnoses:    ICD-10-CM   1. Spondylosis without myelopathy or radiculopathy, lumbar region  M47.816 XR C-ARM NO REPORT    Radiofrequency,Lumbar    betamethasone acetate-betamethasone sodium phosphate  (CELESTONE) injection 12 mg    Plan: No additional findings.   Meds & Orders:  Meds ordered this encounter  Medications  . betamethasone acetate-betamethasone sodium phosphate (CELESTONE) injection 12 mg    Orders Placed This Encounter  Procedures  . Radiofrequency,Lumbar  . XR C-ARM NO REPORT    Follow-up: Return if symptoms worsen or fail to improve.   Procedures: No procedures performed  Lumbar Facet Joint Nerve Denervation  Patient: Brandon B Risden Jr.      Date of Birth: May 04, 1950 MRN: [id number] PCP: Danna Hefty, DO      Visit Date: 03/05/2020   Universal Protocol:    Date/Time: 03/02/225:57 AM  Consent Given By: the patient  Position: PRONE  Additional Comments: Vital signs were monitored before and after the procedure. Patient was prepped and draped in the usual sterile fashion. The correct patient, procedure, and site was verified.   Injection Procedure Details:   Procedure diagnoses:  1. Spondylosis without myelopathy or radiculopathy, lumbar region      Meds Administered:  Meds ordered this encounter  Medications  . betamethasone acetate-betamethasone sodium phosphate (CELESTONE) injection 12 mg     Laterality: Right  Location/Site:  L3-L4, L2 and L3 medial branches, L4-L5, L3 and L4 medial branches and L5-S1, L4 medial branch and L5 dorsal ramus  Needle: 18 ga.,  63mm active tip RF Cannula  Needle Placement: Along juncture of superior articular process and transverse pocess  Findings:  -Comments:  Procedure Details: For each desired  target nerve, the corresponding transverse process (sacral ala for the L5 dorsal rami) was identified and the fluoroscope was positioned to square off the endplates of the corresponding vertebral body to achieve a true AP midline view.  The beam was then obliqued 15 to 20 degrees and caudally tilted 15 to 20 degrees to line up a trajectory along the target nerves. The skin over the target of the junction of  superior articulating process and transverse process (sacral ala for the L5 dorsal rami) was infiltrated with 32ml of 1% Lidocaine without Epinephrine.  The 18 gauge 40mm active tip outer cannula was advanced in trajectory view to the target.  This procedure was repeated for each target nerve.  Then, for all levels, the outer cannula placement was fine-tuned and the position was then confirmed with bi-planar imaging.    Test stimulation was done both at sensory and motor levels to ensure there was no radicular stimulation. The target tissues were then infiltrated with 1 ml of 1% Lidocaine without Epinephrine. Subsequently, a percutaneous neurotomy was carried out for 90 seconds at 80 degrees Celsius.  After the completion of the lesion, 1 ml of injectate was delivered. It was then repeated for each facet joint nerve mentioned above. Appropriate radiographs were obtained to verify the probe placement during the neurotomy.   Additional Comments:  The patient tolerated the procedure well Dressing: 2 x 2 sterile gauze and Band-Aid    Post-procedure details: Patient was observed during the procedure. Post-procedure instructions were reviewed.  Patient left the clinic in stable condition.        Clinical History: Lumbar spine MRI dated 05/24/2017 by report from a [facility] Impression: : Interval increase in size of moderate right foraminal T12-L1 disc extrusion with mild cranial migration and resulting moderate severe right foraminal narrowing with some impingement and displacement of the exiting right T12 nerve root.  New left T11-12 synovial cyst with probable moderate spinal canal stenosis and some indenting of the left hemicord.  There is also moderate bilateral L3-4 foraminal narrowing     Objective:  VS:  HT:    WT:   BMI:     BP:(!) 141/82  HR:66bpm  TEMP: ( )  RESP:  Physical Exam Vitals and nursing note reviewed.  Constitutional:      General: He is not in acute  distress.    Appearance: Normal appearance. He is obese. He is not ill-appearing.  HENT:     Head: Normocephalic and atraumatic.     Right Ear: External ear normal.     Left Ear: External ear normal.     Nose: No congestion.  Eyes:     Extraocular Movements: Extraocular movements intact.  Cardiovascular:     Rate and Rhythm: Normal rate.     Pulses: Normal pulses.  Pulmonary:     Effort: Pulmonary effort is normal. No respiratory distress.  Abdominal:     General: There is no distension.     Palpations: Abdomen is soft.  Musculoskeletal:        General: No tenderness or signs of injury.     Cervical back: Neck supple.     Right lower leg: No edema.     Left lower leg: No edema.     Comments: Patient has good distal strength without clonus.Patient somewhat slow to rise from a seated position to full extension.  There is concordant low back pain with facet loading and lumbar spine extension rotation.  There are no definitive trigger points  but the patient is somewhat tender across the lower back and PSIS.  There is no pain with hip rotation.   Skin:    Findings: No erythema or rash.  Neurological:     General: No focal deficit present.     Mental Status: He is alert and oriented to person, place, and time.     Sensory: No sensory deficit.     Motor: No weakness or abnormal muscle tone.     Coordination: Coordination normal.  Psychiatric:        Mood and Affect: Mood normal.        Behavior: Behavior normal.      Imaging: XR C-ARM NO REPORT  Result Date: 03/05/2020 Please see Notes tab for imaging impression.

## 2020-03-06 NOTE — Procedures (Signed)
Lumbar Facet Joint Nerve Denervation  Patient: Brandon [name].      Date of Birth: 13-Oct-1950 MRN: [id number] PCP: Danna Hefty, DO      Visit Date: 03/05/2020   Universal Protocol:    Date/Time: 03/02/225:57 AM  Consent Given By: the patient  Position: PRONE  Additional Comments: Vital signs were monitored before and after the procedure. Patient was prepped and draped in the usual sterile fashion. The correct patient, procedure, and site was verified.   Injection Procedure Details:   Procedure diagnoses:  1. Spondylosis without myelopathy or radiculopathy, lumbar region      Meds Administered:  Meds ordered this encounter  Medications  . betamethasone acetate-betamethasone sodium phosphate (CELESTONE) injection 12 mg     Laterality: Right  Location/Site:  L3-L4, L2 and L3 medial branches, L4-L5, L3 and L4 medial branches and L5-S1, L4 medial branch and L5 dorsal ramus  Needle: 18 ga.,  23mm active tip RF Cannula  Needle Placement: Along juncture of superior articular process and transverse pocess  Findings:  -Comments:  Procedure Details: For each desired target nerve, the corresponding transverse process (sacral ala for the L5 dorsal rami) was identified and the fluoroscope was positioned to square off the endplates of the corresponding vertebral body to achieve a true AP midline view.  The beam was then obliqued 15 to 20 degrees and caudally tilted 15 to 20 degrees to line up a trajectory along the target nerves. The skin over the target of the junction of superior articulating process and transverse process (sacral ala for the L5 dorsal rami) was infiltrated with 19ml of 1% Lidocaine without Epinephrine.  The 18 gauge 32mm active tip outer cannula was advanced in trajectory view to the target.  This procedure was repeated for each target nerve.  Then, for all levels, the outer cannula placement was fine-tuned and the position was then confirmed with bi-planar  imaging.    Test stimulation was done both at sensory and motor levels to ensure there was no radicular stimulation. The target tissues were then infiltrated with 1 ml of 1% Lidocaine without Epinephrine. Subsequently, a percutaneous neurotomy was carried out for 90 seconds at 80 degrees Celsius.  After the completion of the lesion, 1 ml of injectate was delivered. It was then repeated for each facet joint nerve mentioned above. Appropriate radiographs were obtained to verify the probe placement during the neurotomy.   Additional Comments:  The patient tolerated the procedure well Dressing: 2 x 2 sterile gauze and Band-Aid    Post-procedure details: Patient was observed during the procedure. Post-procedure instructions were reviewed.  Patient left the clinic in stable condition.

## 2020-03-12 ENCOUNTER — Encounter: Payer: Medicare Other | Admitting: Physical Medicine and Rehabilitation

## 2020-03-12 ENCOUNTER — Other Ambulatory Visit: Payer: Self-pay | Admitting: Physical Medicine and Rehabilitation

## 2020-03-12 MED ORDER — PREDNISONE 50 MG PO TABS: ORAL_TABLET | ORAL | 0 refills | Status: DC

## 2020-03-12 NOTE — Progress Notes (Signed)
Prednisone Rx

## 2020-04-03 ENCOUNTER — Ambulatory Visit (INDEPENDENT_AMBULATORY_CARE_PROVIDER_SITE_OTHER): Payer: Medicare Other | Admitting: Physical Medicine and Rehabilitation

## 2020-04-03 ENCOUNTER — Ambulatory Visit: Payer: Self-pay

## 2020-04-03 ENCOUNTER — Encounter: Payer: Self-pay | Admitting: Physical Medicine and Rehabilitation

## 2020-04-03 ENCOUNTER — Other Ambulatory Visit: Payer: Self-pay

## 2020-04-03 VITALS — BP 130/85 | HR 79

## 2020-04-03 DIAGNOSIS — M47816 Spondylosis without myelopathy or radiculopathy, lumbar region: Secondary | ICD-10-CM

## 2020-04-03 MED ORDER — BETAMETHASONE SOD PHOS & ACET 6 (3-3) MG/ML IJ SUSP
6.0000 mg | Freq: Once | INTRAMUSCULAR | Status: AC
  Administered 2020-04-03: 6 mg

## 2020-04-03 NOTE — Progress Notes (Signed)
Pt state lower back pain mostly on his left side. Pt state walking makes the pain worse. Pt state he take pain meds to help ease his pain. Pt state pain meds that was given to him helped a lot. Pt has hx of inj on 03/05/20 pt state it help and he could tell the different from pain on his left side.  Numeric Pain Rating Scale and Functional Assessment Average Pain 6   In the last MONTH (on 0-10 scale) has pain interfered with the following?  1. General activity like being  able to carry out your everyday physical activities such as walking, climbing stairs, carrying groceries, or moving a chair?  Rating(7)   +Driver, -BT, -Dye Allergies.

## 2020-04-03 NOTE — Patient Instructions (Signed)

## 2020-04-04 NOTE — Procedures (Signed)
Lumbar Facet Joint Nerve Denervation  Patient: Brandon [name].      Date of Birth: 10-14-1950 MRN: [id number] PCP: Danna Hefty, DO      Visit Date: 04/03/2020   Universal Protocol:    Date/Time: 03/31/225:36 AM  Consent Given By: the patient  Position: PRONE  Additional Comments: Vital signs were monitored before and after the procedure. Patient was prepped and draped in the usual sterile fashion. The correct patient, procedure, and site was verified.   Injection Procedure Details:   Procedure diagnoses:  1. Spondylosis without myelopathy or radiculopathy, lumbar region      Meds Administered:  Meds ordered this encounter  Medications  . betamethasone acetate-betamethasone sodium phosphate (CELESTONE) injection 6 mg     Laterality: Left  Location/Site:  L3-L4, L2 and L3 medial branches, L4-L5, L3 and L4 medial branches and L5-S1, L4 medial branch and L5 dorsal ramus  Needle: 18 ga.,  5mm active tip RF Cannula  Needle Placement: Along juncture of superior articular process and transverse pocess  Findings:  -Comments:  Procedure Details: For each desired target nerve, the corresponding transverse process (sacral ala for the L5 dorsal rami) was identified and the fluoroscope was positioned to square off the endplates of the corresponding vertebral body to achieve a true AP midline view.  The beam was then obliqued 15 to 20 degrees and caudally tilted 15 to 20 degrees to line up a trajectory along the target nerves. The skin over the target of the junction of superior articulating process and transverse process (sacral ala for the L5 dorsal rami) was infiltrated with 30ml of 1% Lidocaine without Epinephrine.  The 18 gauge 44mm active tip outer cannula was advanced in trajectory view to the target.  This procedure was repeated for each target nerve.  Then, for all levels, the outer cannula placement was fine-tuned and the position was then confirmed with bi-planar  imaging.    Test stimulation was done both at sensory and motor levels to ensure there was no radicular stimulation. The target tissues were then infiltrated with 1 ml of 1% Lidocaine without Epinephrine. Subsequently, a percutaneous neurotomy was carried out for 90 seconds at 80 degrees Celsius.  After the completion of the lesion, 1 ml of injectate was delivered. It was then repeated for each facet joint nerve mentioned above. Appropriate radiographs were obtained to verify the probe placement during the neurotomy.   Additional Comments:  No complications occurred Dressing: 2 x 2 sterile gauze and Band-Aid    Post-procedure details: Patient was observed during the procedure. Post-procedure instructions were reviewed.  Patient left the clinic in stable condition.

## 2020-04-04 NOTE — Progress Notes (Signed)
[name]. - 70 y.o. male MRN [id number]  Date of birth: 04/24/1950  Office Visit Note: Visit Date: 04/03/2020 PCP: Danna Hefty, DO Referred by: Danna Hefty, DO  Subjective: Chief Complaint  Patient presents with  . Lower Back - Pain   HPI:  Brandon [name]. is a 69 y.o. male who comes in today for planned radiofrequency ablation of the Left L3-L4, L4-L5 and L5-S1 Lumbar facet joints. This would be ablation of the corresponding medial branches and/or dorsal rami.  Patient has had double diagnostic blocks with more than 50% relief.  These are documented on pain diary.  They have had chronic back pain for quite some time, more than 3 months, which has been an ongoing situation with recalcitrant axial back pain.  They have no radicular pain.  Their axial pain is worse with standing and ambulating and on exam today with facet loading.  They have had physical therapy as well as home exercise program.  The imaging noted in the chart below indicated facet pathology. Accordingly they meet all the criteria and qualification for for radiofrequency ablation and we are going to complete this today hopefully for more longer term relief as part of comprehensive management program.  ROS Otherwise per HPI.  Assessment & Plan: Visit Diagnoses:    ICD-10-CM   1. Spondylosis without myelopathy or radiculopathy, lumbar region  M47.816 XR C-ARM NO REPORT    Radiofrequency,Lumbar    betamethasone acetate-betamethasone sodium phosphate (CELESTONE) injection 6 mg    Plan: No additional findings.   Meds & Orders:  Meds ordered this encounter  Medications  . betamethasone acetate-betamethasone sodium phosphate (CELESTONE) injection 6 mg    Orders Placed This Encounter  Procedures  . Radiofrequency,Lumbar  . XR C-ARM NO REPORT    Follow-up: Return if symptoms worsen or fail to improve.   Procedures: No procedures performed  Lumbar Facet Joint Nerve Denervation  Patient: Brandon B Revelo  Jr.      Date of Birth: 06-03-1950 MRN: [id number] PCP: Danna Hefty, DO      Visit Date: 04/03/2020   Universal Protocol:    Date/Time: 03/31/225:36 AM  Consent Given By: the patient  Position: PRONE  Additional Comments: Vital signs were monitored before and after the procedure. Patient was prepped and draped in the usual sterile fashion. The correct patient, procedure, and site was verified.   Injection Procedure Details:   Procedure diagnoses:  1. Spondylosis without myelopathy or radiculopathy, lumbar region      Meds Administered:  Meds ordered this encounter  Medications  . betamethasone acetate-betamethasone sodium phosphate (CELESTONE) injection 6 mg     Laterality: Left  Location/Site:  L3-L4, L2 and L3 medial branches, L4-L5, L3 and L4 medial branches and L5-S1, L4 medial branch and L5 dorsal ramus  Needle: 18 ga.,  69mm active tip RF Cannula  Needle Placement: Along juncture of superior articular process and transverse pocess  Findings:  -Comments:  Procedure Details: For each desired target nerve, the corresponding transverse process (sacral ala for the L5 dorsal rami) was identified and the fluoroscope was positioned to square off the endplates of the corresponding vertebral body to achieve a true AP midline view.  The beam was then obliqued 15 to 20 degrees and caudally tilted 15 to 20 degrees to line up a trajectory along the target nerves. The skin over the target of the junction of superior articulating process and transverse process (sacral ala for the L5 dorsal rami)  was infiltrated with 38ml of 1% Lidocaine without Epinephrine.  The 18 gauge 5mm active tip outer cannula was advanced in trajectory view to the target.  This procedure was repeated for each target nerve.  Then, for all levels, the outer cannula placement was fine-tuned and the position was then confirmed with bi-planar imaging.    Test stimulation was done both at sensory and motor  levels to ensure there was no radicular stimulation. The target tissues were then infiltrated with 1 ml of 1% Lidocaine without Epinephrine. Subsequently, a percutaneous neurotomy was carried out for 90 seconds at 80 degrees Celsius.  After the completion of the lesion, 1 ml of injectate was delivered. It was then repeated for each facet joint nerve mentioned above. Appropriate radiographs were obtained to verify the probe placement during the neurotomy.   Additional Comments:  No complications occurred Dressing: 2 x 2 sterile gauze and Band-Aid    Post-procedure details: Patient was observed during the procedure. Post-procedure instructions were reviewed.  Patient left the clinic in stable condition.        Clinical History: Lumbar spine MRI dated 05/24/2017 by report from a [facility] Impression: : Interval increase in size of moderate right foraminal T12-L1 disc extrusion with mild cranial migration and resulting moderate severe right foraminal narrowing with some impingement and displacement of the exiting right T12 nerve root.  New left T11-12 synovial cyst with probable moderate spinal canal stenosis and some indenting of the left hemicord.  There is also moderate bilateral L3-4 foraminal narrowing     Objective:  VS:  HT:    WT:   BMI:     BP:130/85  HR:79bpm  TEMP: ( )  RESP:  Physical Exam Vitals and nursing note reviewed.  Constitutional:      General: He is not in acute distress.    Appearance: Normal appearance. He is obese. He is not ill-appearing.  HENT:     Head: Normocephalic and atraumatic.     Right Ear: External ear normal.     Left Ear: External ear normal.     Nose: No congestion.  Eyes:     Extraocular Movements: Extraocular movements intact.  Cardiovascular:     Rate and Rhythm: Normal rate.     Pulses: Normal pulses.  Pulmonary:     Effort: Pulmonary effort is normal. No respiratory distress.  Abdominal:     General: There is no  distension.     Palpations: Abdomen is soft.  Musculoskeletal:        General: No tenderness or signs of injury.     Cervical back: Neck supple.     Right lower leg: No edema.     Left lower leg: No edema.     Comments: Patient has good distal strength without clonus. Patient somewhat slow to rise from a seated position to full extension.  There is concordant low back pain with facet loading and lumbar spine extension rotation.  There are no definitive trigger points but the patient is somewhat tender across the lower back and PSIS.  There is no pain with hip rotation.   Skin:    Findings: No erythema or rash.  Neurological:     General: No focal deficit present.     Mental Status: He is alert and oriented to person, place, and time.     Sensory: No sensory deficit.     Motor: No weakness or abnormal muscle tone.     Coordination: Coordination normal.  Psychiatric:  Mood and Affect: Mood normal.        Behavior: Behavior normal.      Imaging: XR C-ARM NO REPORT  Result Date: 04/03/2020 Please see Notes tab for imaging impression.

## 2020-04-09 ENCOUNTER — Telehealth: Payer: Self-pay | Admitting: *Deleted

## 2020-04-09 NOTE — Telephone Encounter (Signed)
MyChart message sent to Pt regarding losartan:  Per review of your chart:  Brandon [name], Brandon [name]    02/26/20 8:28 AM Note Received notice from [facility] that patient's losartan can not be filled due to backorder. Will change to irbesartan

## 2020-04-09 NOTE — Telephone Encounter (Signed)
Pt left a voicemail on the refill line requesting a refill for Potassium.  Don't see it on medication list, only spironolactone. Will forward to Dr. Jackalyn Lombard RN, Otila Kluver.

## 2020-04-12 ENCOUNTER — Other Ambulatory Visit: Payer: Self-pay | Admitting: Internal Medicine

## 2020-04-12 NOTE — Telephone Encounter (Signed)
Prescription refill request for Eliquis received. Indication: SVT/ AFIB Last office visit: Lovena Le 12/11/2019 Scr: 0.87, 01/11/2020 Age: 70 yo  Weight: 139.2 kg   Pt is on the correct dose of Eliquis per dosing criteria, prescription refill sent for Eliquis 5mg  BID.

## 2020-04-17 ENCOUNTER — Encounter: Payer: Self-pay | Admitting: Physical Medicine and Rehabilitation

## 2020-04-29 ENCOUNTER — Other Ambulatory Visit: Payer: Self-pay

## 2020-04-29 DIAGNOSIS — F331 Major depressive disorder, recurrent, moderate: Secondary | ICD-10-CM

## 2020-04-30 MED ORDER — ESCITALOPRAM OXALATE 20 MG PO TABS: 20.0000 mg | ORAL_TABLET | Freq: Every day | ORAL | 3 refills | Status: DC

## 2020-05-14 ENCOUNTER — Other Ambulatory Visit: Payer: Self-pay | Admitting: Family Medicine

## 2020-05-24 ENCOUNTER — Other Ambulatory Visit: Payer: Self-pay | Admitting: Physical Medicine and Rehabilitation

## 2020-05-24 NOTE — Telephone Encounter (Signed)
Please advise 

## 2020-06-05 ENCOUNTER — Ambulatory Visit (INDEPENDENT_AMBULATORY_CARE_PROVIDER_SITE_OTHER): Payer: Medicare Other | Admitting: Physical Medicine and Rehabilitation

## 2020-06-05 ENCOUNTER — Encounter: Payer: Self-pay | Admitting: Physical Medicine and Rehabilitation

## 2020-06-05 ENCOUNTER — Other Ambulatory Visit: Payer: Self-pay

## 2020-06-05 VITALS — BP 130/74 | HR 63

## 2020-06-05 DIAGNOSIS — M47816 Spondylosis without myelopathy or radiculopathy, lumbar region: Secondary | ICD-10-CM

## 2020-06-05 DIAGNOSIS — G8929 Other chronic pain: Secondary | ICD-10-CM

## 2020-06-05 DIAGNOSIS — M545 Low back pain, unspecified: Secondary | ICD-10-CM

## 2020-06-05 DIAGNOSIS — M48062 Spinal stenosis, lumbar region with neurogenic claudication: Secondary | ICD-10-CM

## 2020-06-05 NOTE — Progress Notes (Signed)
Very little relief from RFA. States that he might have had slight relief for a short time, but pain is now at same level as prior to RFA. Both sides of low back are the same. Fell Friday on patio- landed on left knee and possibly hip. Left hip and knee pain and low back pain is causing him to rate pain at 9. Numeric Pain Rating Scale and Functional Assessment Average Pain 9   In the last MONTH (on 0-10 scale) has pain interfered with the following?  1. General activity like being  able to carry out your everyday physical activities such as walking, climbing stairs, carrying groceries, or moving a chair?  Rating(9)

## 2020-06-10 ENCOUNTER — Other Ambulatory Visit: Payer: Self-pay | Admitting: Family Medicine

## 2020-06-17 ENCOUNTER — Encounter: Payer: Self-pay | Admitting: Physical Medicine and Rehabilitation

## 2020-06-17 NOTE — Progress Notes (Signed)
Brandon [name]. - 70 y.o. male MRN [id number]  Date of birth: 1950/06/05  Office Visit Note: Visit Date: 06/05/2020 PCP: Danna Hefty, DO Referred by: Danna Hefty, DO  Subjective: Chief Complaint  Patient presents with   Lower Back - Pain   HPI: Brandon Dec. is a 70 y.o. male who comes in today For evaluation and management of chronic severe recalcitrant low back pain.  History of chronic low back pain really not resolved with history of physical therapy and guided exercise as well as spine interventions at [facility] which should include epidural injections at that time.  By history MRI from 2019 showed lumbar spondylosis without stenosis but he did have some lateral recess narrowing.  He had upper lumbar lower thoracic moderate stenosis and disc herniation.  He has tried and failed all manner of medications at [facility] and physical therapy at [facility] physical therapy on [address].  The notes from our office visits can be reviewed.  I felt like he had facet mediated pain of the lower spine given that his symptoms are across the lower lower back.  No real symptoms of the lower thoracic area.  He went on to complete double diagnostic medial branch blocks with more than 50% relief.  Exam was consistent with facet mediated pain as well.  Unfortunately radiofrequency ablation only helped short-term and he is having symptoms now that he rates are as bad as they were prior to the ablation and much like he has had for several years now.  Symptoms are worse with standing and walking and do cause him to stop from walking.  He does ambulate with a cane.  He does not have any pain down the legs or paresthesias.  Some referral into the buttocks.  He denies any focal weakness.  Unfortunately he did have a fall recently.  He fell last Friday on a patio.  He landed on his left knee and may be on his side of his hip.  Left hip and knee pain are doing okay but that is why he is rating his pain  higher today at least from his history.  He does not have any real big complaints of the fall other than its hurting his knee at this point.  He has had no swelling or locking or any other issues with that.  Just historically he did come to [place] after seeing a video presentation we made on chronic pain management.  At the time that I first saw him he was interested in spinal cord stimulators.  Review of Systems  Musculoskeletal:  Positive for back pain and joint pain.  All other systems reviewed and are negative. Otherwise per HPI.  Assessment & Plan: Visit Diagnoses:    ICD-10-CM   1. Spinal stenosis of lumbar region with neurogenic claudication  M48.062 MR LUMBAR SPINE WO CONTRAST    Ambulatory referral to Physical Therapy    2. Spondylosis without myelopathy or radiculopathy, lumbar region  M47.816 MR LUMBAR SPINE WO CONTRAST    Ambulatory referral to Physical Therapy    3. Chronic bilateral low back pain without sciatica  M54.50 MR LUMBAR SPINE WO CONTRAST   G89.29 Ambulatory referral to Physical Therapy       Plan: Findings:  1.  Chronic recalcitrant severe low back pain referral into the hips and buttock with no relief from recent radiofrequency ablation.  Ongoing symptoms almost appear to be neurogenic claudication type symptoms.  Prior MRI from [facility]  with moderate stenosis of the lower thoracic region but nothing in the lower spine.  Pain is all in the lower spine.  At this point given failure of all manner of care at this point I do want to update his MRI of his lumbar spine and hope they can include the lower thoracic region when they look at that.  He is a tall individual.  I debated about getting the thoracic MRI but we will see how this updated MRI looks.  I also want to refer him to physical therapy once again, but in our office, to look at trying to help him with his back pain and posture and exercises.  He does have some limitation with gait.  I do think dry needling may  help with the quadratus lumborum.  2.  Recent ground-level fall without any concerns on the patient as far as true injury to the hip and knee.  Brief exam today did not show anything concerning.  He can follow-up with [place] over MyChart if he has concerns.   Meds & Orders: No orders of the defined types were placed in this encounter.   Orders Placed This Encounter  Procedures   MR LUMBAR SPINE [place]   Ambulatory referral to Physical Therapy    Follow-up: No follow-ups on file.   Procedures: No procedures performed      Clinical History: Lumbar spine MRI dated 05/24/2017 by [facility] a [facility]:: Interval increase in size of moderate right foraminal T12-L1 disc extrusion with mild cranial migration and resulting moderate severe right foraminal narrowing with some impingement and displacement of the exiting right T12 nerve root.  New left T11-12 synovial cyst with probable moderate spinal canal stenosis and some indenting of the left hemicord.  There is also moderate bilateral L3-4 foraminal narrowing   He reports that he quit smoking about 46 years ago. His smoking use included cigarettes. He has a 2.50 pack-year smoking history. He has never used smokeless tobacco.  Recent Labs    01/11/20 1023  LABURIC 4.8    Objective:  VS:  HT:    WT:   BMI:     BP:130/74  HR:63bpm  TEMP: ( )  RESP:  Physical Exam Vitals and nursing note reviewed.  Constitutional:      General: He is not in acute distress.    Appearance: Normal appearance. He is well-developed. He is obese.  HENT:     Head: Normocephalic and atraumatic.  Eyes:     Conjunctiva/sclera: Conjunctivae normal.     Pupils: Pupils are equal, round, and reactive to light.  Cardiovascular:     Rate and Rhythm: Normal rate.     Pulses: Normal pulses.     Heart sounds: Normal heart sounds.  Pulmonary:     Effort: Pulmonary effort is normal. No respiratory distress.  Abdominal:     General: There is no  distension.     Palpations: Abdomen is soft.  Musculoskeletal:     Cervical back: Normal range of motion and neck supple. No rigidity.     Right lower leg: Edema present.     Left lower leg: Edema present.     Comments: Examination of the lumbar spine shows trigger points to palpation of the lower paraspinal musculature although he is a large individual.  Deep palpation does elicit some pain.  No pain over the greater trochanters or PSIS.  He does have mild tenderness over the left iliotibial band.  Examination of both knees shows  no varus or valgus instability.  No swelling or large effusion.  No redness or induration.  He does ambulate with a cane.  He has pain with extension of the lumbar spine.  He has good distal strength.  No clonus.  Skin:    General: Skin is warm and dry.     Findings: No erythema or rash.  Neurological:     General: No focal deficit present.     Mental Status: He is alert and oriented to person, place, and time.     Cranial Nerves: No cranial nerve deficit.     Sensory: No sensory deficit.     Motor: No weakness.     Coordination: Coordination normal.     Gait: Gait abnormal.  Psychiatric:        Mood and Affect: Mood normal.        Behavior: Behavior normal.    Ortho Exam  Imaging: No results found.  Past Medical/Family/Surgical/Social History: Medications & Allergies reviewed per EMR, new medications updated. Patient Active Problem List   Diagnosis Date Noted   Olecranon bursitis 01/12/2020   Breast lump 03/29/2019   SVT (supraventricular tachycardia) ([place]) 01/21/2019   S/P right total knee arthroplasty 05/24/2018   Insomnia due to medical condition 12/23/2016   BPH (benign prostatic hyperplasia) 10/21/2016   Essential hypertension 10/21/2016   MDD (major depressive disorder) 10/21/2016   Obesity (BMI 30-39.9) 03/07/2016   History of alcohol abuse 03/07/2016   History of colon polyps 11/18/2015   Paroxysmal atrial fibrillation (HCC)    Restless  legs syndrome 02/08/2014   Generalized anxiety disorder 12/12/2013   Recurrent unilateral inguinal hernia 07/17/2013   Nonrheumatic mitral valve insufficiency 04/25/2013   Current use of long term anticoagulation    Obstructive sleep apnea 03/09/2013   Chronic bilateral low back pain with sciatica 03/21/2012   HLD (hyperlipidemia) 03/21/2012   Arthralgia of hip 03/21/2012   History of pulmonary embolism 03/19/2012   Bilateral inguinal hernia 09/73/5329   Uncomplicated asthma 92/42/6834   Anxiety state 07/18/2007   Past Medical History:  Diagnosis Date   Anxiety    Arthritis    Benign neoplasm of colon    Benign prostatic hyperplasia with urinary obstruction    Bilateral pulmonary embolism ([place]) 3/14   admitted to [facility],  treated with Xarelto   Cataract    Chronic pain    History of kidney stones    History of pulmonary embolism 03/19/2012   Hyperlipemia    Hypertension    Insomnia    Major depressive disorder, recurrent episode ([place])    Mitral regurgitation 04/24/2013   Mild by TEE   Obesity    OSA (obstructive sleep apnea)    noncompliant with CPAP.  07/27/13- awaiting a CPAP- unable to tolerate mask   Osteoporosis    Persistent atrial fibrillation (HCC)    Restless leg syndrome    takes gabapentin   Sciatica    SVT (supraventricular tachycardia) ([place]) 03/07/2016   Thrombus of left atrial appendage 03/09/2013   Ventral hernia, unspecified, without mention of obstruction or gangrene    right abdominal wall   Family History  Problem Relation Age of Onset   Cancer Father        bone   Heart failure Mother    Hypertension Mother    Asthma Mother    Cancer Paternal Grandmother        ovarian   CVA Other        Fam Hx of multiple  myeloma   Diabetes Other        Fam Hx of DM   Past Surgical History:  Procedure Laterality Date   CARDIOVERSION N/A 03/21/2012   Procedure: CARDIOVERSION;  Surgeon: Birdie Riddle, MD;  Location: [place];  Service:  Cardiovascular;  Laterality: N/A;   CARDIOVERSION N/A 04/24/2013   Procedure: CARDIOVERSION;  Surgeon: Dorothy Spark, MD;  Location: [facility] ENDOSCOPY;  Service: Cardiovascular;  Laterality: N/A;   CARDIOVERSION N/A 06/27/2015   Procedure: CARDIOVERSION;  Surgeon: Larey Dresser, MD;  Location: [place];  Service: Cardiovascular;  Laterality: N/A;   CATARACT EXTRACTION     COLONOSCOPY W/ POLYPECTOMY     ELECTROPHYSIOLOGIC STUDY N/A 07/30/2015   Procedure: Atrial Fibrillation Ablation;  Surgeon: Thompson Grayer, MD;  Location: [place] CV LAB;  Service: Cardiovascular;  Laterality: N/A;   EXTRACORPOREAL SHOCK WAVE LITHOTRIPSY Right 10/29/2016   Procedure: RIGHT EXTRACORPOREAL SHOCK WAVE LITHOTRIPSY (ESWL);  Surgeon: Alexis Frock, MD;  Location: [facility];  Service: Urology;  Laterality: Right;   EYE SURGERY Right    cataract   FEMUR FRACTURE SURGERY     HERNIA REPAIR  07/06/2011   INGUINAL HERNIA REPAIR Right 07/28/2013   Procedure: RIGHT INGUINAL HERNIA REPAIR;  Surgeon: Imogene Burn. Georgette Dover, MD;  Location: [place];  Service: General;  Laterality: Right;   INGUINAL HERNIA REPAIR Right 09/22/2016   Procedure: LAPAROSCOPIC RIGHT INGUINAL HERNIA;  Surgeon: Kieth Brightly Arta Bruce, MD;  Location: [facility];  Service: General;  Laterality: Right;  With MESH   INSERTION OF MESH Right 07/28/2013   Procedure: INSERTION OF MESH;  Surgeon: Imogene Burn. Georgette Dover, MD;  Location: [place];  Service: General;  Laterality: Right;   KNEE ARTHROSCOPY     left   REPLACEMENT TOTAL KNEE Left    ROTATOR CUFF REPAIR     right   ROTATOR CUFF REPAIR Left 03/08/2014   DR SUPPLE   SHOULDER ARTHROSCOPY WITH ROTATOR CUFF REPAIR AND SUBACROMIAL DECOMPRESSION Left 03/08/2014   Procedure: LEFT SHOULDER ARTHROSCOPY WITH ROTATOR CUFF REPAIR/SUBACROMIAL DECOMPRESSION/DISTAL CLAVICLE RESECTION;  Surgeon: Marin Shutter, MD;  Location: [place];  Service: Orthopedics;  Laterality: Left;   SVT ABLATION N/A 01/23/2019   Procedure: SVT ABLATION;   Surgeon: Evans Lance, MD;  Location: [place] CV LAB;  Service: Cardiovascular;  Laterality: N/A;   TEE WITHOUT CARDIOVERSION N/A 03/21/2012   Procedure: TRANSESOPHAGEAL ECHOCARDIOGRAM (TEE);  Surgeon: Birdie Riddle, MD;  Location: [place];  Service: Cardiovascular;  Laterality: N/A;   TEE WITHOUT CARDIOVERSION N/A 04/24/2013   Procedure: TRANSESOPHAGEAL ECHOCARDIOGRAM (TEE);  Surgeon: Dorothy Spark, MD;  Location: [place];  Service: Cardiovascular;  Laterality: N/A;   TEE WITHOUT CARDIOVERSION N/A 07/29/2015   Procedure: TRANSESOPHAGEAL ECHOCARDIOGRAM (TEE);  Surgeon: Fay Records, MD;  Location: [place];  Service: Cardiovascular;  Laterality: N/A;   TONSILLECTOMY     TOTAL KNEE ARTHROPLASTY Right 05/24/2018   Procedure: RIGHT TOTAL KNEE ARTHROPLASTY;  Surgeon: Paralee Cancel, MD;  Location[facility];  Service: Orthopedics;  Laterality: Right;  70 mins   Social History   Occupational History   Not on file  Tobacco Use   Smoking status: Former    Packs/day: 0.50    Years: 5.00    Pack years: 2.50    Types: Cigarettes    Quit date: 1976    Years since quitting: 46.4   Smokeless tobacco: Never  Vaping Use   Vaping Use: Never used  Substance and Sexual Activity   Alcohol use:  No    Comment: Former EtOH abuse, stopped 06/2016   Drug use: No    Comment: negative hx for IV drug abuse   Sexual activity: Not on file

## 2020-06-18 ENCOUNTER — Other Ambulatory Visit: Payer: Self-pay | Admitting: Family Medicine

## 2020-06-23 ENCOUNTER — Other Ambulatory Visit: Payer: Self-pay

## 2020-06-23 ENCOUNTER — Ambulatory Visit
Admission: RE | Admit: 2020-06-23 | Discharge: 2020-06-23 | Disposition: A | Discharge status: Home / Self Care | Payer: Medicare Other | Source: Ambulatory Visit | Attending: Physical Medicine and Rehabilitation | Admitting: Physical Medicine and Rehabilitation

## 2020-06-23 DIAGNOSIS — M47816 Spondylosis without myelopathy or radiculopathy, lumbar region: Secondary | ICD-10-CM

## 2020-06-23 DIAGNOSIS — G8929 Other chronic pain: Secondary | ICD-10-CM

## 2020-06-23 DIAGNOSIS — M48062 Spinal stenosis, lumbar region with neurogenic claudication: Secondary | ICD-10-CM

## 2020-06-23 DIAGNOSIS — M48061 Spinal stenosis, lumbar region without neurogenic claudication: Secondary | ICD-10-CM | POA: Diagnosis not present

## 2020-07-10 ENCOUNTER — Encounter: Payer: Self-pay | Admitting: Physical Medicine and Rehabilitation

## 2020-07-14 ENCOUNTER — Other Ambulatory Visit: Payer: Self-pay | Admitting: Physical Medicine and Rehabilitation

## 2020-07-14 ENCOUNTER — Telehealth: Payer: Self-pay | Admitting: Family Medicine

## 2020-07-14 DIAGNOSIS — G2581 Restless legs syndrome: Secondary | ICD-10-CM

## 2020-07-15 NOTE — Telephone Encounter (Signed)
Please advise 

## 2020-07-16 NOTE — Telephone Encounter (Signed)
Please schedule with me in next few months for a check-in.  Thank you,  Holley Bouche, MD

## 2020-07-17 ENCOUNTER — Other Ambulatory Visit: Payer: Self-pay | Admitting: Physical Medicine and Rehabilitation

## 2020-07-17 DIAGNOSIS — M48062 Spinal stenosis, lumbar region with neurogenic claudication: Secondary | ICD-10-CM

## 2020-07-17 DIAGNOSIS — G894 Chronic pain syndrome: Secondary | ICD-10-CM

## 2020-07-17 DIAGNOSIS — G8929 Other chronic pain: Secondary | ICD-10-CM

## 2020-07-17 DIAGNOSIS — M47816 Spondylosis without myelopathy or radiculopathy, lumbar region: Secondary | ICD-10-CM

## 2020-07-17 NOTE — Progress Notes (Signed)
Amb ref

## 2020-07-22 ENCOUNTER — Encounter: Payer: Self-pay | Admitting: Student

## 2020-07-22 ENCOUNTER — Ambulatory Visit (INDEPENDENT_AMBULATORY_CARE_PROVIDER_SITE_OTHER): Payer: Medicare Other | Admitting: Student

## 2020-07-22 ENCOUNTER — Other Ambulatory Visit: Payer: Self-pay

## 2020-07-22 DIAGNOSIS — G2581 Restless legs syndrome: Secondary | ICD-10-CM | POA: Diagnosis not present

## 2020-07-22 NOTE — Progress Notes (Signed)
    SUBJECTIVE:   CHIEF COMPLAINT / HPI:   Medication refill for Restless leg syndrome: Patient endorsing use of gabapentin 800 mg twice daily since his diagnosis.  This was being managed by his prior PCP.  He is now requesting a refill of this medication.  His symptoms are well controlled with use of gabapentin.  Encouraged to get shingles vaccine  PERTINENT  PMH / PSH: PAF (on warfarin), HTN, HLD, h/o EtOH abuse, MDD, kidney stone, BPH, OSA, obesity, h/o pulmonary embolism.  OBJECTIVE:   Vitals:   07/22/20 0946  BP: 130/90  Pulse: 75  SpO2: 95%    Physical Exam: General: Well appearing, polite affect, slow gait Heart: normal s1/s2, nrmg Lungs: CTABL Abdominal: WNL, no tenderness, bowel sounds present Extremities: Pulses regular and present on all 4 extremities  ASSESSMENT/PLAN:   Restless leg syndrome: Chronic.  Diagnosed in 2013  Controlled with use of gabapentin 800 mg twice daily.  Patient here for refill and to meet new provider today.  Seems to be well-controlled with the use of gabapentin. - Refill provided for gabapentin 800 mg twice daily - Discussed Tylenol PM and effects of benadryl, patient agrees to try not taking.    Holley Bouche, MD [place]

## 2020-07-22 NOTE — Patient Instructions (Addendum)
It was great to see you! Thank you for allowing me to participate in your care!  I recommend that you always bring your medications to each appointment as this makes it easy to ensure we are on the correct medications and helps [place] not miss when refills are needed.  Our plans for today:  - Continue Gabapentin 800mg  twice daily - Consider discontinue Tyelonol PM   Take care and seek immediate care sooner if you develop any concerns.   Dr. Holley Bouche, MD [place]

## 2020-07-22 NOTE — Assessment & Plan Note (Signed)
Chronic.  Diagnosed in 2013  Controlled with use of gabapentin 800 mg twice daily.  Patient here for refill and to meet new provider today.  Seems to be well-controlled with the use of gabapentin. - Refill provided for gabapentin 800 mg twice daily

## 2020-07-26 DIAGNOSIS — F0634 Mood disorder due to known physiological condition with mixed features: Secondary | ICD-10-CM | POA: Diagnosis not present

## 2020-07-27 DIAGNOSIS — F0634 Mood disorder due to known physiological condition with mixed features: Secondary | ICD-10-CM | POA: Diagnosis not present

## 2020-07-28 DIAGNOSIS — F0634 Mood disorder due to known physiological condition with mixed features: Secondary | ICD-10-CM | POA: Diagnosis not present

## 2020-07-29 DIAGNOSIS — F0634 Mood disorder due to known physiological condition with mixed features: Secondary | ICD-10-CM | POA: Diagnosis not present

## 2020-07-30 DIAGNOSIS — F0634 Mood disorder due to known physiological condition with mixed features: Secondary | ICD-10-CM | POA: Diagnosis not present

## 2020-08-09 ENCOUNTER — Encounter: Payer: Self-pay | Admitting: Student

## 2020-08-09 DIAGNOSIS — N400 Enlarged prostate without lower urinary tract symptoms: Secondary | ICD-10-CM

## 2020-08-15 ENCOUNTER — Other Ambulatory Visit: Payer: Self-pay

## 2020-08-15 DIAGNOSIS — E782 Mixed hyperlipidemia: Secondary | ICD-10-CM

## 2020-08-18 MED ORDER — ATORVASTATIN CALCIUM 40 MG PO TABS: 40.0000 mg | ORAL_TABLET | Freq: Every day | ORAL | 3 refills | Status: DC

## 2020-08-21 ENCOUNTER — Other Ambulatory Visit: Payer: Self-pay | Admitting: Physical Medicine and Rehabilitation

## 2020-08-21 ENCOUNTER — Telehealth: Payer: Self-pay | Admitting: Physical Medicine and Rehabilitation

## 2020-08-21 DIAGNOSIS — F411 Generalized anxiety disorder: Secondary | ICD-10-CM

## 2020-08-21 NOTE — Telephone Encounter (Signed)
Patient needs to schedule a spinal cord stimulator trial placement with Dr. Ernestina Patches at [facility]. He will need to hold Eliquis for 2 days prior. Will this be ok?

## 2020-08-21 NOTE — Telephone Encounter (Signed)
Please advise 

## 2020-08-22 NOTE — Telephone Encounter (Signed)
Patient with diagnosis of afib on Eliquis for anticoagulation.    Procedure: spinal cord stimulator trial placement Date of procedure: TBD  CHA2DS2-VASc Score = 4  This indicates a 4.8% annual risk of stroke. The patient's score is based upon: CHF History: No HTN History: Yes Diabetes History: No Stroke History: Yes (hx of bilateral PE and LA thrombus) Vascular Disease History: No Age Score: 1 Gender Score: 0   Also with hx of bilateral PE 2014 and thrombus of left atrial appendage in 2015.  CrCl >182m/min Platelet count 189K  Request is to hold Eliquis for 2 days prior to procedure. Typically prefer 3 day hold for spinal-related procedures. Given pt's hx of PE and LA thrombus (both at least 7 years ago) will confirm with MD that 3 day Eliquis hold is acceptable.

## 2020-08-29 NOTE — Telephone Encounter (Signed)
3 day hold is acceptable due to spinal procedure.

## 2020-08-30 NOTE — Telephone Encounter (Signed)
    Patient Name: Brandon [name].  DOB: 11-18-1950 MRN: [id number]  Primary Cardiologist: Dr. Lovena Le, MD   Chart reviewed as part of pre-operative protocol coverage. Given past medical history and time since last visit, based on ACC/AHA guidelines, Karston B Pacini Jr. would be at acceptable risk for the planned procedure without further cardiovascular testing.   Patient with diagnosis of afib on Eliquis for anticoagulation.     Procedure: spinal cord stimulator trial placement Date of procedure: TBD   CHA2DS2-VASc Score = 4  This indicates a 4.8% annual risk of stroke. The patient's score is based upon: CHF History: No HTN History: Yes Diabetes History: No Stroke History: Yes (hx of bilateral PE and LA thrombus) Vascular Disease History: No Age Score: 1 Gender Score: 0    Also with hx of bilateral PE 2014 and thrombus of left atrial appendage in 2015.   CrCl >154m/min Platelet count 189K   Request is to hold Eliquis for 2 days prior to procedure. Typically prefer 3 day hold for spinal-related procedures. Given pt's hx of PE and LA thrombus (both at least 7 years ago) will confirm with MD that 3 day Eliquis hold is acceptable. Case reviewed with Dr. TLovena Lewho is ok with the above holding recommendations.    I will route this recommendation to the requesting party via Epic fax function and remove from pre-op pool.  Please call with questions.  JKathyrn Drown NP 08/30/2020, 12:05 PM

## 2020-09-02 NOTE — Telephone Encounter (Signed)
Please see below re clearance to hold Eliquis. Do we need to hold longer than 3 days for this procedure?

## 2020-09-03 NOTE — Telephone Encounter (Signed)
Left message #1

## 2020-09-05 ENCOUNTER — Telehealth: Payer: Self-pay | Admitting: Physical Medicine and Rehabilitation

## 2020-09-05 NOTE — Telephone Encounter (Signed)
Pt returned call to [facility] to set an appt for spinal cord stimulator trial. Please call pt back at [phone].

## 2020-09-05 NOTE — Telephone Encounter (Signed)
Left message #2

## 2020-09-05 NOTE — Telephone Encounter (Signed)
See previous message

## 2020-09-08 ENCOUNTER — Other Ambulatory Visit: Payer: Self-pay | Admitting: Physical Medicine and Rehabilitation

## 2020-09-10 NOTE — Telephone Encounter (Signed)
Please advise 

## 2020-09-10 NOTE — Telephone Encounter (Signed)
Pt called back about the trail.

## 2020-09-10 NOTE — Telephone Encounter (Signed)
Left message #3

## 2020-09-11 NOTE — Telephone Encounter (Signed)
SCS trial scheduled for 9/28 at 0830. I advised patient regarding valium prior. Pharmacy is correct. I also let the patient know that you would send a mychart message regarding anticoagulants.

## 2020-09-11 NOTE — Telephone Encounter (Signed)
Left message #4.  

## 2020-09-20 DIAGNOSIS — H43813 Vitreous degeneration, bilateral: Secondary | ICD-10-CM | POA: Diagnosis not present

## 2020-09-20 DIAGNOSIS — H02882 Meibomian gland dysfunction right lower eyelid: Secondary | ICD-10-CM | POA: Diagnosis not present

## 2020-09-24 MED ORDER — DIAZEPAM 5 MG PO TABS: ORAL_TABLET | ORAL | 0 refills | Status: DC

## 2020-09-24 NOTE — Telephone Encounter (Signed)
Sent to patient via MyChart.

## 2020-10-02 ENCOUNTER — Encounter: Payer: Self-pay | Admitting: Physical Medicine and Rehabilitation

## 2020-10-02 ENCOUNTER — Other Ambulatory Visit: Payer: Self-pay

## 2020-10-02 ENCOUNTER — Ambulatory Visit: Payer: Self-pay

## 2020-10-02 ENCOUNTER — Ambulatory Visit (INDEPENDENT_AMBULATORY_CARE_PROVIDER_SITE_OTHER): Payer: Medicare Other | Admitting: Physical Medicine and Rehabilitation

## 2020-10-02 VITALS — BP 123/83 | HR 64

## 2020-10-02 DIAGNOSIS — M5416 Radiculopathy, lumbar region: Secondary | ICD-10-CM | POA: Diagnosis not present

## 2020-10-02 DIAGNOSIS — G894 Chronic pain syndrome: Secondary | ICD-10-CM

## 2020-10-02 DIAGNOSIS — M48062 Spinal stenosis, lumbar region with neurogenic claudication: Secondary | ICD-10-CM

## 2020-10-02 NOTE — Progress Notes (Signed)
Low back pain with walking. No pain with sitting or sleeping. Sometimes has pain into legs.  Numeric Pain Rating Scale and Functional Assessment Average Pain 10   In the last MONTH (on 0-10 scale) has pain interfered with the following?  1. General activity like being  able to carry out your everyday physical activities such as walking, climbing stairs, carrying groceries, or moving a chair?  Rating(10)   +Driver, -BT-- last took Eliquis Sunday night 9/25

## 2020-10-02 NOTE — Progress Notes (Signed)
Brandon [name]. - 70 y.o. male MRN [id number]  Date of birth: 05-18-1950  Office Visit Note: Visit Date: 10/02/2020 PCP: Brandon Bouche, MD Referred by: Brandon Bouche, MD  Subjective: Chief Complaint  Patient presents with   Lower Back - Pain   HPI:  Brandon [name]. is a 70 y.o. male who comes in today for planned spinal cord stimulator trial. The patient has failed conservative care including home exercise, medications, time and activity modification as well as injections and surgery.  This represents a procedure of last resort for recalcitrant pain. Pre-procedure psychological evaluation has been completed and procedure pre-authorization has been obtained.  Please see requesting physician notes for further details and justification.  He has tried and failed physical therapy with multiple sessions including [facility] on [address] as well as [facility].  He has had multiple epidural injections by Dr. Nelva Bush.  He has had injections prior to that as well.  We have completed 1 epidural injection and radiofrequency ablation without any sustained relief of his back pain.  He does get some level of pain in the buttocks at times.  He is getting some pain into the leg but no pain past the knee.  He reports 10 out of 10 pain with significant disability and limitations to daily activities.  Do to the patient being on Eliquis anticoagulation for atrial fibrillation a prior history remotely of pulmonary embolism we are going to conduct a 3-day trial instead of the typical 5-7.  He has been off Eliquis for 2 days and will get a go ahead and hold it the next 2 days.  Referring:  Self  ROS Otherwise per HPI.  Assessment & Plan: Visit Diagnoses:    ICD-10-CM   1. Lumbar radiculopathy  M54.16 XR C-ARM NO REPORT    Spinal Cord Stimulator - Analysis    Spinal Cord Stimulator - Placement    2. Spinal stenosis of lumbar region with neurogenic claudication  M48.062 XR C-ARM NO REPORT     Spinal Cord Stimulator - Analysis    Spinal Cord Stimulator - Placement    3. Chronic pain syndrome  G89.4 XR C-ARM NO REPORT    Spinal Cord Stimulator - Analysis    Spinal Cord Stimulator - Placement      Plan: No additional findings.   Meds & Orders: No orders of the defined types were placed in this encounter.   Orders Placed This Encounter  Procedures   Spinal Cord Stimulator - Analysis   Spinal Cord Stimulator - Placement   XR C-ARM NO REPORT    Follow-up: Return in about 3 days (around 10/05/2020) for Spinal cord trial lead..   Procedures: No procedures performed  Spinal Cord Stimulator Trial  Patient: Brandon [name].      Date of Birth: 02-01-1950 MRN: [id number] PCP: Brandon Bouche, MD      Visit Date: 10/02/2020   Universal Protocol:    Date/Time: 09/30/225:34 PM  Consent Given By: the patient  Position: PRONE  Additional Comments: Vital signs were monitored before and after the procedure. Patient was prepped and draped in the usual sterile fashion. The correct patient, procedure, and site was verified.   Injection Procedure Details:  Procedure Site One Meds Administered: No orders of the defined types were placed in this encounter.    Location/Site: Leads were placed just to the left and right of midline with the upper most lead at the top of the T7 vertebral body  just at the superior endplate and then spanning 3 vertebral bodies with the inferior lead at the bottom of the T9 vertebral body.   Needle size: 14 gauge   Needle type: EPIMED RX Coud Epidural Needle  Needle Placement: L1-L2 Dorsal epidural space  Findings:  -  -Comments: Excellent paresthesia coverage was obtained in all of the areas of the patient's normal pain  Procedure Details: After localization of the L1-L2 epidural space and the overlying soft tissue, the needle was introduced two bodies below this level to produce an adequate angle for epidural penetration. The soft tissue was  infiltrated with 2-3 mls. of 1% Lidocaine without Epinephrine. Using the posterolateral approach, the #14 gauge Epimed needle was inserted down to the posterior aspect of the L1 lamina and then "walked off" the superior aspect of this lamina into the L1-L2 epidural space. The epidural space was localized with loss of resistance and negative aspirate for CSF or blood. The [employer] (16) electrode stimulation lead was passed up so that just the superior most lead was at the location noted above while maintaining the lead in the midline.   The above procedure was repeated for a second [place] (16) electrode stimulation lead for the opposite side. The pulse generator system was adjusted and programmed by myself and the company technical representative by varying the stimulator sites, rates, pulse amplitude, and duration. Adequate coverage was obtained as described above.  Subsequent to this, the introducer needle was removed and the spinal cord stimulator trial lead was fastened to the skin using steri-strips and strain reduction loop. The lead wire was then connected and Tegaderm was applied to produce an occlusive dressing. The patient was then brought out of the procedure room and was given a portable stimulator.  The patient will follow-up in three to seven days to determine whether this was efficacious.  Oral prophylactic antibiotic, Keflex was prescribed as noted.   Additional Comments:  The patient tolerated the procedure well Dressing: As noted above    Post-procedure details: Patient was observed during the procedure. Post-procedure instructions were reviewed.  Patient left the clinic in stable condition.      Clinical History: Lumbar spine MRI dated 05/24/2017 by report from a [facility] Impression: : Interval increase in size of moderate right foraminal T12-L1 disc extrusion with mild cranial migration and resulting moderate severe right  foraminal narrowing with some impingement and displacement of the exiting right T12 nerve root.  New left T11-12 synovial cyst with probable moderate spinal canal stenosis and some indenting of the left hemicord.  There is also moderate bilateral L3-4 foraminal narrowing     Objective:  VS:  HT:    WT:   BMI:     BP:123/83  HR:64bpm  TEMP: ( )  RESP:  Physical Exam Vitals and nursing note reviewed.  Constitutional:      General: He is not in acute distress.    Appearance: Normal appearance. He is obese. He is not ill-appearing.  HENT:     Head: Normocephalic and atraumatic.     Right Ear: External ear normal.     Left Ear: External ear normal.     Nose: No congestion.  Eyes:     Extraocular Movements: Extraocular movements intact.  Cardiovascular:     Rate and Rhythm: Normal rate.     Pulses: Normal pulses.  Pulmonary:     Effort: Pulmonary effort is normal. No respiratory distress.  Abdominal:     General:  There is no distension.     Palpations: Abdomen is soft.  Musculoskeletal:        General: No tenderness or signs of injury.     Cervical back: Neck supple.     Right lower leg: No edema.     Left lower leg: No edema.     Comments: Patient has good distal strength without clonus.  Skin:    Findings: No erythema or rash.  Neurological:     General: No focal deficit present.     Mental Status: He is alert and oriented to person, place, and time.     Sensory: No sensory deficit.     Motor: No weakness or abnormal muscle tone.     Coordination: Coordination normal.  Psychiatric:        Mood and Affect: Mood normal.        Behavior: Behavior normal.     Imaging: No results found.

## 2020-10-04 ENCOUNTER — Ambulatory Visit (INDEPENDENT_AMBULATORY_CARE_PROVIDER_SITE_OTHER): Payer: Medicare Other | Admitting: Physical Medicine and Rehabilitation

## 2020-10-04 ENCOUNTER — Encounter: Payer: Self-pay | Admitting: Physical Medicine and Rehabilitation

## 2020-10-04 ENCOUNTER — Other Ambulatory Visit: Payer: Self-pay

## 2020-10-04 DIAGNOSIS — G894 Chronic pain syndrome: Secondary | ICD-10-CM

## 2020-10-04 DIAGNOSIS — M5416 Radiculopathy, lumbar region: Secondary | ICD-10-CM

## 2020-10-04 DIAGNOSIS — M48062 Spinal stenosis, lumbar region with neurogenic claudication: Secondary | ICD-10-CM

## 2020-10-04 NOTE — Progress Notes (Signed)
Brandon [name] returns today for what was supposed to be lead pull from the spinal cord stimulator trial.  We were trying to do an abbreviated trial do the patient's anticoagulation status.  We had a long talk with him today because he does want to continue the trial for couple more days over the weekend as he does feel like he was getting some relief and now that he understands how the system works through the instruction of the [employer] that was here today.  The only factor that is an issue here is the anticoagulation.  I went over with him that every day off the anticoagulation is a small risk of having a blood clot whether it be for his atrial fibrillation which is mainly why he is on the medication and his history of prior pulmonary embolus which was a long time ago.  We decided together to mitigate the risk by placing him back on Eliquis for 1 day, today.  He will then go off the Eliquis for 2 days to lead pull on Monday.  That should mitigate the risk fairly well and I think he likely see if this trial helps him enough to decide on permanent placement.  We will see him back on Monday.

## 2020-10-04 NOTE — Procedures (Signed)
Advanced programming analysis performed with the [employer] technical representative with physician input and  monitoring. [employer] FAST and contour programming with  combined therapy initiated. 

## 2020-10-04 NOTE — Procedures (Signed)
Spinal Cord Stimulator Trial  Patient: Brandon [name].      Date of Birth: 1950/04/25 MRN: [id number] PCP: Holley Bouche, MD      Visit Date: 10/02/2020   Universal Protocol:    Date/Time: 09/30/225:34 PM  Consent Given By: the patient  Position: PRONE  Additional Comments: Vital signs were monitored before and after the procedure. Patient was prepped and draped in the usual sterile fashion. The correct patient, procedure, and site was verified.   Injection Procedure Details:  Procedure Site One Meds Administered: No orders of the defined types were placed in this encounter.    Location/Site: Leads were placed just to the left and right of midline with the upper most lead at the top of the T7 vertebral body just at the superior endplate and then spanning 3 vertebral bodies with the inferior lead at the bottom of the T9 vertebral body.   Needle size: 14 gauge   Needle type: EPIMED RX Coud Epidural Needle  Needle Placement: L1-L2 Dorsal epidural space  Findings:  -  -Comments: Excellent paresthesia coverage was obtained in all of the areas of the patient's normal pain  Procedure Details: After localization of the L1-L2 epidural space and the overlying soft tissue, the needle was introduced two bodies below this level to produce an adequate angle for epidural penetration. The soft tissue was infiltrated with 2-3 mls. of 1% Lidocaine without Epinephrine. Using the posterolateral approach, the #14 gauge Epimed needle was inserted down to the posterior aspect of the L1 lamina and then "walked off" the superior aspect of this lamina into the L1-L2 epidural space. The epidural space was localized with loss of resistance and negative aspirate for CSF or blood. The [employer] (16) electrode stimulation lead was passed up so that just the superior most lead was at the location noted above while maintaining the lead in the midline.   The above procedure was repeated for  a second [place] (16) electrode stimulation lead for the opposite side. The pulse generator system was adjusted and programmed by myself and the company technical representative by varying the stimulator sites, rates, pulse amplitude, and duration. Adequate coverage was obtained as described above.  Subsequent to this, the introducer needle was removed and the spinal cord stimulator trial lead was fastened to the skin using steri-strips and strain reduction loop. The lead wire was then connected and Tegaderm was applied to produce an occlusive dressing. The patient was then brought out of the procedure room and was given a portable stimulator.  The patient will follow-up in three to seven days to determine whether this was efficacious.  Oral prophylactic antibiotic, Keflex was prescribed as noted.   Additional Comments:  The patient tolerated the procedure well Dressing: As noted above    Post-procedure details: Patient was observed during the procedure. Post-procedure instructions were reviewed.  Patient left the clinic in stable condition.

## 2020-10-04 NOTE — Progress Notes (Signed)
Patient states that he cannot say after one full day of trial if the SCS is helping enough.

## 2020-10-07 ENCOUNTER — Ambulatory Visit (INDEPENDENT_AMBULATORY_CARE_PROVIDER_SITE_OTHER): Payer: Medicare Other | Admitting: Physical Medicine and Rehabilitation

## 2020-10-07 ENCOUNTER — Encounter: Payer: Self-pay | Admitting: Physical Medicine and Rehabilitation

## 2020-10-07 ENCOUNTER — Other Ambulatory Visit: Payer: Self-pay

## 2020-10-07 VITALS — BP 126/80 | HR 75

## 2020-10-07 DIAGNOSIS — M5416 Radiculopathy, lumbar region: Secondary | ICD-10-CM

## 2020-10-07 DIAGNOSIS — G894 Chronic pain syndrome: Secondary | ICD-10-CM

## 2020-10-07 DIAGNOSIS — M48062 Spinal stenosis, lumbar region with neurogenic claudication: Secondary | ICD-10-CM

## 2020-10-07 NOTE — Progress Notes (Signed)
Pt state that the SCS has helped.

## 2020-10-13 NOTE — Progress Notes (Signed)
[name] - 70 y.o. male MRN [id number]  Date of birth: [birth year]  Office Visit Note: Visit Date: 10/07/2020 PCP: Holley Bouche, MD Referred by: Holley Bouche, MD  Subjective: No chief complaint on file.  HPI: Brandon [name]. is a 70 y.o. male who comes in today  for spinal cord stimulator lead pull and management of chronic worsening severe pain which has been recalcitrant to previous treatment.  He reports greater than 60% pain relief with the stimulator trial and is very pleased with the amount of relief and functional increase he has obtained.  He is able to walk and do more with the stimulator in place.  I did originally have him on a shortened trial do to anticoagulation but after speaking with him on Friday we extended it through the weekend and he did have no issues with the anticoagulation.  We had had him retake the medicine on Saturday and then stop it for the lead pull.  Despite getting relief with the procedure he is unsure if he really wants to have a device present and all that entails.  He is going to think about proceeding forward with implant and he will let us know.  I will have the [employer] contact him as well.  ROS Otherwise per HPI.  Assessment & Plan: Visit Diagnoses:    ICD-10-CM   1. Lumbar radiculopathy  M54.16     2. Spinal stenosis of lumbar region with neurogenic claudication  M48.062     3. Chronic pain syndrome  G89.4        Plan: No additional findings.   Meds & Orders: No orders of the defined types were placed in this encounter.  No orders of the defined types were placed in this encounter.   Follow-up: Return if symptoms worsen or fail to improve.   Procedures:   Quick procedure note: Patient was placed prone on the exam table. The external stimulator/generator was unfastened from the leads. The outer Tegaderm was removed from the bandage carefully. There was no noted erythema of the skin or swelling or  induration or drainage. There had been some bloody discharge in the gauze pad. Both trial leads were pulled without difficulty and without discomfort. There was no drainage from the insertion site. Band-Aid was applied.      Clinical History: Lumbar spine MRI dated 05/24/2017 by report from a [facility] Impression: : Interval increase in size of moderate right foraminal T12-L1 disc extrusion with mild cranial migration and resulting moderate severe right foraminal narrowing with some impingement and displacement of the exiting right T12 nerve root.  New left T11-12 synovial cyst with probable moderate spinal canal stenosis and some indenting of the left hemicord.  There is also moderate bilateral L3-4 foraminal narrowing   He reports that he quit smoking about 46 years ago. His smoking use included cigarettes. He has a 2.50 pack-year smoking history. He has never used smokeless tobacco.  Recent Labs    01/11/20 1023  LABURIC 4.8    Objective:  VS:  HT:    WT:   BMI:     BP:126/80  HR:75bpm  TEMP: ( )  RESP:  Physical Exam  Ortho Exam  Imaging: No results found.  Past Medical/Family/Surgical/Social History: Medications & Allergies reviewed per EMR, new medications updated. Patient Active Problem List   Diagnosis Date Noted   Olecranon bursitis 01/12/2020   Breast lump 03/29/2019   SVT (supraventricular tachycardia) ([place]) 01/21/2019   S/P  right total knee arthroplasty 05/24/2018   Insomnia due to medical condition 12/23/2016   BPH (benign prostatic hyperplasia) 10/21/2016   Essential hypertension 10/21/2016   Depression, major, single episode, complete remission ([place]) 10/21/2016   Obesity (BMI 30-39.9) 03/07/2016   History of alcohol abuse 03/07/2016   History of colon polyps 11/18/2015   Paroxysmal atrial fibrillation (HCC)    Restless legs syndrome 02/08/2014   Generalized anxiety disorder 12/12/2013   Recurrent unilateral inguinal hernia 07/17/2013   Nonrheumatic  mitral valve insufficiency 04/25/2013   Current use of long term anticoagulation    Obstructive sleep apnea 03/09/2013   Chronic bilateral low back pain with sciatica 03/21/2012   HLD (hyperlipidemia) 03/21/2012   Arthralgia of hip 03/21/2012   History of pulmonary embolism 03/19/2012   Bilateral inguinal hernia 33/35/4562   Uncomplicated asthma 56/38/9373   Anxiety state 07/18/2007   Past Medical History:  Diagnosis Date   Anxiety    Arthritis    Benign neoplasm of colon    Benign prostatic hyperplasia with urinary obstruction    Bilateral pulmonary embolism ([place]) 3/14   admitted to [facility],  treated with Xarelto   Cataract    Chronic pain    History of alcohol abuse 03/07/2016   History of colon polyps 11/18/2015   History of kidney stones    History of pulmonary embolism 03/19/2012   Hyperlipemia    Hypertension    Insomnia    Major depressive disorder, recurrent episode ([place])    Mitral regurgitation 04/24/2013   Mild by TEE   Obesity    OSA (obstructive sleep apnea)    noncompliant with CPAP.  07/27/13- awaiting a CPAP- unable to tolerate mask   Osteoporosis    Persistent atrial fibrillation (HCC)    Restless leg syndrome    takes gabapentin   Sciatica    SVT (supraventricular tachycardia) ([place]) 03/07/2016   Thrombus of left atrial appendage 03/09/2013   Ventral hernia, unspecified, without mention of obstruction or gangrene    right abdominal wall   Family History  Problem Relation Age of Onset   Cancer Father        bone   Heart failure Mother    Hypertension Mother    Asthma Mother    Cancer Paternal Grandmother        ovarian   CVA Other        Fam Hx of multiple myeloma   Diabetes Other        Fam Hx of DM   Past Surgical History:  Procedure Laterality Date   CARDIOVERSION N/A 03/21/2012   Procedure: CARDIOVERSION;  Surgeon: Birdie Riddle, MD;  Location: [facility];  Service: Cardiovascular;  Laterality: N/A;   CARDIOVERSION N/A 04/24/2013    Procedure: CARDIOVERSION;  Surgeon: Dorothy Spark, MD;  Location: [facility];  Service: Cardiovascular;  Laterality: N/A;   CARDIOVERSION N/A 06/27/2015   Procedure: CARDIOVERSION;  Surgeon: Larey Dresser, MD;  Location: [facility];  Service: Cardiovascular;  Laterality: N/A;   CATARACT EXTRACTION     COLONOSCOPY W/ POLYPECTOMY     ELECTROPHYSIOLOGIC STUDY N/A 07/30/2015   Procedure: Atrial Fibrillation Ablation;  Surgeon: Thompson Grayer, MD;  Location: [place] [facility];  Service: Cardiovascular;  Laterality: N/A;   EXTRACORPOREAL SHOCK WAVE LITHOTRIPSY Right 10/29/2016   Procedure: RIGHT EXTRACORPOREAL SHOCK WAVE LITHOTRIPSY (ESWL);  Surgeon: Alexis Frock, MD;  Location: [facility];  Service: Urology;  Laterality: Right;   EYE SURGERY Right    cataract   FEMUR  FRACTURE SURGERY     HERNIA REPAIR  07/06/2011   INGUINAL HERNIA REPAIR Right 07/28/2013   Procedure: RIGHT INGUINAL HERNIA REPAIR;  Surgeon: Imogene Burn. Georgette Dover, MD;  Location: [place];  Service: General;  Laterality: Right;   INGUINAL HERNIA REPAIR Right 09/22/2016   Procedure: LAPAROSCOPIC RIGHT INGUINAL HERNIA;  Surgeon: Kieth Brightly Arta Bruce, MD;  Location: [facility];  Service: General;  Laterality: Right;  With MESH   INSERTION OF MESH Right 07/28/2013   Procedure: INSERTION OF MESH;  Surgeon: Imogene Burn. Georgette Dover, MD;  Location: [place];  Service: General;  Laterality: Right;   KNEE ARTHROSCOPY     left   REPLACEMENT TOTAL KNEE Left    ROTATOR CUFF REPAIR     right   ROTATOR CUFF REPAIR Left 03/08/2014   DR SUPPLE   SHOULDER ARTHROSCOPY WITH ROTATOR CUFF REPAIR AND SUBACROMIAL DECOMPRESSION Left 03/08/2014   Procedure: LEFT SHOULDER ARTHROSCOPY WITH ROTATOR CUFF REPAIR/SUBACROMIAL DECOMPRESSION/DISTAL CLAVICLE RESECTION;  Surgeon: Marin Shutter, MD;  Location: [place];  Service: Orthopedics;  Laterality: Left;   SVT ABLATION N/A 01/23/2019   Procedure: SVT ABLATION;  Surgeon: Evans Lance, MD;  Location: [place] [facility];  Service:  Cardiovascular;  Laterality: N/A;   TEE WITHOUT CARDIOVERSION N/A 03/21/2012   Procedure: TRANSESOPHAGEAL ECHOCARDIOGRAM (TEE);  Surgeon: Birdie Riddle, MD;  Location: [place];  Service: Cardiovascular;  Laterality: N/A;   TEE WITHOUT CARDIOVERSION N/A 04/24/2013   Procedure: TRANSESOPHAGEAL ECHOCARDIOGRAM (TEE);  Surgeon: Dorothy Spark, MD;  Location: [place];  Service: Cardiovascular;  Laterality: N/A;   TEE WITHOUT CARDIOVERSION N/A 07/29/2015   Procedure: TRANSESOPHAGEAL ECHOCARDIOGRAM (TEE);  Surgeon: Fay Records, MD;  Location: [place];  Service: Cardiovascular;  Laterality: N/A;   TONSILLECTOMY     TOTAL KNEE ARTHROPLASTY Right 05/24/2018   Procedure: RIGHT TOTAL KNEE ARTHROPLASTY;  Surgeon: Paralee Cancel, MD;  Location: [facility];  Service: Orthopedics;  Laterality: Right;  70 mins   Social History   Occupational History   Not on file  Tobacco Use   Smoking status: Former    Packs/day: 0.50    Years: 5.00    Pack years: 2.50    Types: Cigarettes    Quit date: 1976    Years since quitting: 46.8   Smokeless tobacco: Never  Vaping Use   Vaping Use: Never used  Substance and Sexual Activity   Alcohol use: No    Comment: Former EtOH abuse, stopped 06/2016   Drug use: No    Comment: negative hx for IV drug abuse   Sexual activity: Not on file

## 2020-10-14 ENCOUNTER — Encounter: Payer: Self-pay | Admitting: Physical Medicine and Rehabilitation

## 2020-10-14 DIAGNOSIS — M48062 Spinal stenosis, lumbar region with neurogenic claudication: Secondary | ICD-10-CM

## 2020-10-14 DIAGNOSIS — M5416 Radiculopathy, lumbar region: Secondary | ICD-10-CM

## 2020-10-14 DIAGNOSIS — G894 Chronic pain syndrome: Secondary | ICD-10-CM

## 2020-10-15 ENCOUNTER — Other Ambulatory Visit: Payer: Self-pay | Admitting: Internal Medicine

## 2020-10-15 ENCOUNTER — Telehealth: Payer: Self-pay | Admitting: Physical Medicine and Rehabilitation

## 2020-10-15 NOTE — Telephone Encounter (Signed)
Pt returned call to [place]. Please call pt at [phone].

## 2020-10-16 NOTE — Telephone Encounter (Signed)
Pt last saw Dr Lovena Le 12/11/19, last labs 01/11/20 Creat 0.87, age 70, weight 134.3kg, based on specified criteria pt is on appropriate dosage of Eliquis 5mg  BID for afib.  Will refill rx.

## 2020-10-16 NOTE — Telephone Encounter (Signed)
I did not call this patient.

## 2020-10-17 ENCOUNTER — Other Ambulatory Visit: Payer: Self-pay

## 2020-10-17 ENCOUNTER — Encounter: Payer: Self-pay | Admitting: Orthopedic Surgery

## 2020-10-17 ENCOUNTER — Ambulatory Visit: Payer: Self-pay

## 2020-10-17 ENCOUNTER — Other Ambulatory Visit: Payer: Self-pay | Admitting: Physical Medicine and Rehabilitation

## 2020-10-17 ENCOUNTER — Ambulatory Visit (INDEPENDENT_AMBULATORY_CARE_PROVIDER_SITE_OTHER): Payer: Medicare Other | Admitting: Orthopedic Surgery

## 2020-10-17 VITALS — BP 139/84 | HR 67 | Ht 76.0 in | Wt 301.0 lb

## 2020-10-17 DIAGNOSIS — R2231 Localized swelling, mass and lump, right upper limb: Secondary | ICD-10-CM

## 2020-10-17 DIAGNOSIS — M79644 Pain in right finger(s): Secondary | ICD-10-CM

## 2020-10-17 HISTORY — DX: Localized swelling, mass and lump, right upper limb: R22.31

## 2020-10-17 NOTE — Progress Notes (Signed)
Office Visit Note   Patient: Brandon [name].           Date of Birth: 01-Dec-1950           MRN: [id number] Visit Date: 10/17/2020              Requested by: Brandon Bouche, MD [address] [place],  [place] [address] PCP: Brandon Bouche, MD   Assessment & Plan: Visit Diagnoses:  1. Finger pain, right   2. Finger mass, right     Plan: Discussed with patient that mass at ulnar aspect of small finger around PIP joint seems like early Dupuytren's disease.  It feels like there is a spiral or abductor digiti minimi cord running proximal.  We discussed next steps in management including further imaging studies such as an ultrasound to further evaluate versus continued monitoring.  He wants to continue to watch it for now as it was found incidentally and is asymptomatic. He can follow up as needed.   Follow-Up Instructions: No follow-ups on file.   Orders:  Orders Placed This Encounter  Procedures   XR Finger Little Right   No orders of the defined types were placed in this encounter.     Procedures: No procedures performed   Clinical Data: No additional findings.   Subjective: Chief Complaint  Patient presents with   Right Hand - New Patient (Initial Visit)    This is a 70 year old right-hand-dominant male who presents with an incidentally found mass at the ulnar aspect of the right small finger.  He is recent started playing [name] and noticed a small mass around the level of the PIP joint on the ulnar small finger while playing.  The mass is asymptomatic.  Has not changed in size since it was first noticed approximately 3 months ago.  He has full range of motion of this finger.  He denies any numbness or paresthesias in the finger.  He has no personal or family history of Dupuytren's disease or other lumps and bumps.   Review of Systems  Constitutional: Negative.   Respiratory: Negative.    Cardiovascular: Negative.   Skin: Negative.     Objective: Vital Signs: BP  139/84 (BP Location: Left Arm, Patient Position: Sitting, Cuff Size: Large)   Pulse 67   Ht $R'6\' 4"'vP$  (1.93 m)   Wt (!) 301 lb (136.5 kg)   SpO2 90%   BMI 36.64 kg/m   Physical Exam Constitutional:      Appearance: Normal appearance.  Cardiovascular:     Rate and Rhythm: Normal rate.     Pulses: Normal pulses.  Pulmonary:     Effort: Pulmonary effort is normal.  Skin:    General: Skin is warm and dry.     Capillary Refill: Capillary refill takes less than 2 seconds.  Neurological:     Mental Status: He is alert.    Right Hand Exam   Tenderness  The patient is experiencing no tenderness.   Range of Motion  The patient has normal right wrist ROM.   Muscle Strength  The patient has normal right wrist strength.  Other  Erythema: absent Sensation: normal Pulse: present  Comments:  Small, approx 1x1 cm mass at ulnar aspect of small finger around PIP joint w/ palpable cord running proximal.  Full ROM of digit.  No flexion contracture.      Specialty Comments:  No specialty comments available.  Imaging: 3V of the right small finger taken today are reviewed  and interpreted by me.  They demonstrate mild degenerative changes at the PIP joint.  There is no radio-opaque mass seen in the small finger.   PMFS History: Patient Active Problem List   Diagnosis Date Noted   Finger mass, right 10/17/2020   Olecranon bursitis 01/12/2020   Breast lump 03/29/2019   SVT (supraventricular tachycardia) ([place]) 01/21/2019   S/P right total knee arthroplasty 05/24/2018   Insomnia due to medical condition 12/23/2016   BPH (benign prostatic hyperplasia) 10/21/2016   Essential hypertension 10/21/2016   Depression, major, single episode, complete remission ([place]) 10/21/2016   Obesity (BMI 30-39.9) 03/07/2016   History of alcohol abuse 03/07/2016   History of colon polyps 11/18/2015   Paroxysmal atrial fibrillation (HCC)    Restless legs syndrome 02/08/2014   Generalized anxiety disorder  12/12/2013   Recurrent unilateral inguinal hernia 07/17/2013   Nonrheumatic mitral valve insufficiency 04/25/2013   Current use of long term anticoagulation    Obstructive sleep apnea 03/09/2013   Chronic bilateral low back pain with sciatica 03/21/2012   HLD (hyperlipidemia) 03/21/2012   Arthralgia of hip 03/21/2012   History of pulmonary embolism 03/19/2012   Bilateral inguinal hernia 67/12/4578   Uncomplicated asthma 99/83/3825   Anxiety state 07/18/2007   Past Medical History:  Diagnosis Date   Anxiety    Arthritis    Benign neoplasm of colon    Benign prostatic hyperplasia with urinary obstruction    Bilateral pulmonary embolism ([place]) 3/14   admitted to [facility],  treated with Xarelto   Cataract    Chronic pain    History of alcohol abuse 03/07/2016   History of colon polyps 11/18/2015   History of kidney stones    History of pulmonary embolism 03/19/2012   Hyperlipemia    Hypertension    Insomnia    Major depressive disorder, recurrent episode ([place])    Mitral regurgitation 04/24/2013   Mild by TEE   Obesity    OSA (obstructive sleep apnea)    noncompliant with CPAP.  07/27/13- awaiting a CPAP- unable to tolerate mask   Osteoporosis    Persistent atrial fibrillation (HCC)    Restless leg syndrome    takes gabapentin   Sciatica    SVT (supraventricular tachycardia) ([place]) 03/07/2016   Thrombus of left atrial appendage 03/09/2013   Ventral hernia, unspecified, without mention of obstruction or gangrene    right abdominal wall    Family History  Problem Relation Age of Onset   Cancer Father        bone   Heart failure Mother    Hypertension Mother    Asthma Mother    Cancer Paternal Grandmother        ovarian   CVA Other        Fam Hx of multiple myeloma   Diabetes Other        Fam Hx of DM    Past Surgical History:  Procedure Laterality Date   CARDIOVERSION N/A 03/21/2012   Procedure: CARDIOVERSION;  Surgeon: Brandon Riddle, MD;  Location: [facility]OSCOPY;   Service: Cardiovascular;  Laterality: N/A;   CARDIOVERSION N/A 04/24/2013   Procedure: CARDIOVERSION;  Surgeon: Brandon Spark, MD;  Location: [facility] ENDOSCOPY;  Service: Cardiovascular;  Laterality: N/A;   CARDIOVERSION N/A 06/27/2015   Procedure: CARDIOVERSION;  Surgeon: Larey Dresser, MD;  Location: [place];  Service: Cardiovascular;  Laterality: N/A;   CATARACT EXTRACTION     COLONOSCOPY W/ POLYPECTOMY     ELECTROPHYSIOLOGIC STUDY N/A 07/30/2015  Procedure: Atrial Fibrillation Ablation;  Surgeon: Thompson Grayer, MD;  Location: [place] CV LAB;  Service: Cardiovascular;  Laterality: N/A;   EXTRACORPOREAL SHOCK WAVE LITHOTRIPSY Right 10/29/2016   Procedure: RIGHT EXTRACORPOREAL SHOCK WAVE LITHOTRIPSY (ESWL);  Surgeon: Alexis Frock, MD;  [facility];  Service: Urology;  Laterality: Right;   EYE SURGERY Right    cataract   FEMUR FRACTURE SURGERY     HERNIA REPAIR  07/06/2011   INGUINAL HERNIA REPAIR Right 07/28/2013   Procedure: RIGHT INGUINAL HERNIA REPAIR;  Surgeon: Imogene Burn. Georgette Dover, MD;  Location: [place];  Service: General;  Laterality: Right;   INGUINAL HERNIA REPAIR Right 09/22/2016   Procedure: LAPAROSCOPIC RIGHT INGUINAL HERNIA;  Surgeon: Kieth Brightly Arta Bruce, MD;  [facility];  Service: General;  Laterality: Right;  With MESH   INSERTION OF MESH Right 07/28/2013   Procedure: INSERTION OF MESH;  Surgeon: Imogene Burn. Georgette Dover, MD;  Location: [place];  Service: General;  Laterality: Right;   KNEE ARTHROSCOPY     left   REPLACEMENT TOTAL KNEE Left    ROTATOR CUFF REPAIR     right   ROTATOR CUFF REPAIR Left 03/08/2014   DR SUPPLE   SHOULDER ARTHROSCOPY WITH ROTATOR CUFF REPAIR AND SUBACROMIAL DECOMPRESSION Left 03/08/2014   Procedure: LEFT SHOULDER ARTHROSCOPY WITH ROTATOR CUFF REPAIR/SUBACROMIAL DECOMPRESSION/DISTAL CLAVICLE RESECTION;  Surgeon: Marin Shutter, MD;  Location: [place];  Service: Orthopedics;  Laterality: Left;   SVT ABLATION N/A 01/23/2019   Procedure: SVT  ABLATION;  Surgeon: Evans Lance, MD;  Location: [place] CV LAB;  Service: Cardiovascular;  Laterality: N/A;   TEE WITHOUT CARDIOVERSION N/A 03/21/2012   Procedure: TRANSESOPHAGEAL ECHOCARDIOGRAM (TEE);  Surgeon: Brandon Riddle, MD;  Location: [place];  Service: Cardiovascular;  Laterality: N/A;   TEE WITHOUT CARDIOVERSION N/A 04/24/2013   Procedure: TRANSESOPHAGEAL ECHOCARDIOGRAM (TEE);  Surgeon: Brandon Spark, MD;  Location: [place];  Service: Cardiovascular;  Laterality: N/A;   TEE WITHOUT CARDIOVERSION N/A 07/29/2015   Procedure: TRANSESOPHAGEAL ECHOCARDIOGRAM (TEE);  Surgeon: Fay Records, MD;  Location: [place];  Service: Cardiovascular;  Laterality: N/A;   TONSILLECTOMY     TOTAL KNEE ARTHROPLASTY Right 05/24/2018   Procedure: RIGHT TOTAL KNEE ARTHROPLASTY;  Surgeon: Paralee Cancel, MD;  [facility];  Service: Orthopedics;  Laterality: Right;  70 mins   Social History   Occupational History   Not on file  Tobacco Use   Smoking status: Former    Packs/day: 0.50    Years: 5.00    Pack years: 2.50    Types: Cigarettes    Quit date: 1976    Years since quitting: 46.8   Smokeless tobacco: Never  Vaping Use   Vaping Use: Never used  Substance and Sexual Activity   Alcohol use: No    Comment: Former EtOH abuse, stopped 06/2016   Drug use: No    Comment: negative hx for IV drug abuse   Sexual activity: Not on file

## 2020-10-17 NOTE — Telephone Encounter (Signed)
Please advise 

## 2020-10-30 ENCOUNTER — Other Ambulatory Visit: Payer: Self-pay

## 2020-10-30 ENCOUNTER — Encounter: Payer: Self-pay | Admitting: Physical Therapy

## 2020-10-30 ENCOUNTER — Ambulatory Visit (INDEPENDENT_AMBULATORY_CARE_PROVIDER_SITE_OTHER): Payer: Medicare Other | Admitting: Physical Therapy

## 2020-10-30 DIAGNOSIS — R293 Abnormal posture: Secondary | ICD-10-CM

## 2020-10-30 DIAGNOSIS — G8929 Other chronic pain: Secondary | ICD-10-CM

## 2020-10-30 DIAGNOSIS — R2689 Other abnormalities of gait and mobility: Secondary | ICD-10-CM

## 2020-10-30 DIAGNOSIS — M545 Low back pain, unspecified: Secondary | ICD-10-CM

## 2020-10-30 DIAGNOSIS — M6281 Muscle weakness (generalized): Secondary | ICD-10-CM | POA: Diagnosis not present

## 2020-10-30 NOTE — Patient Instructions (Signed)
Access Code: [email or web address] URL: [email or web address] Date: 10/30/2020 Prepared by: Elsie Ra  Exercises Hooklying Single Knee to Chest Stretch - 2 x daily - 6 x weekly - 1 sets - 2 reps - 30 hold Supine Lower Trunk Rotation - 2 x daily - 6 x weekly - 1 sets - 10 reps - 5 sec hold Supine Bridge - 2 x daily - 6 x weekly - 1-2 sets - 10 reps - 5 hold Seated Lumbar Flexion Stretch - 2 x daily - 6 x weekly - 1 sets - 10 reps - 10 hold Mini Squat with Counter Support - 2 x daily - 6 x weekly - 2 sets - 10 reps Standing Tandem Balance with Counter Support - 2 x daily - 6 x weekly - 1 sets - 3 reps - 30 hold

## 2020-10-30 NOTE — Therapy (Addendum)
[facility] Physical Therapy [address] [place], [place], [address] Phone: [phone]   Fax:  [phone]  Physical Therapy Evaluation/Discharge PHYSICAL THERAPY DISCHARGE SUMMARY  Visits from Start of Care: 1  Current functional level related to goals / functional outcomes: See below   Remaining deficits: See below   Education / Equipment: HEP  Plan: Patient goals were not met. Patient is being discharged due to not returning since last visit  Elsie Ra, PT, DPT 06/10/21 10:46 AM     Patient Details  Name: Brandon [name]. MRN: [id number] Date of Birth: [birth year] Referring Provider (PT): Magnus Sinning, MD   Encounter Date: 10/30/2020   PT End of Session - 10/30/20 1103     Visit Number 1    Number of Visits 12    Date for PT Re-Evaluation 12/25/20    Authorization Type MCR    Progress Note Due on Visit 10    PT Start Time 3716    PT Stop Time 9678   pt requested to leave early due to abdominal cramping   PT Time Calculation (min) 32 min    Activity Tolerance Patient tolerated treatment well             Past Medical History:  Diagnosis Date   Anxiety    Arthritis    Benign neoplasm of colon    Benign prostatic hyperplasia with urinary obstruction    Bilateral pulmonary embolism ([place]) 3/14   admitted to [facility],  treated with Xarelto   Cataract    Chronic pain    History of alcohol abuse 03/07/2016   History of colon polyps 11/18/2015   History of kidney stones    History of pulmonary embolism 03/19/2012   Hyperlipemia    Hypertension    Insomnia    Major depressive disorder, recurrent episode ([place])    Mitral regurgitation 04/24/2013   Mild by TEE   Obesity    OSA (obstructive sleep apnea)    noncompliant with CPAP.  07/27/13- awaiting a CPAP- unable to tolerate mask   Osteoporosis    Persistent atrial fibrillation (HCC)    Restless leg syndrome    takes gabapentin   Sciatica    SVT (supraventricular tachycardia)  ([place]) 03/07/2016   Thrombus of left atrial appendage 03/09/2013   Ventral hernia, unspecified, without mention of obstruction or gangrene    right abdominal wall    Past Surgical History:  Procedure Laterality Date   CARDIOVERSION N/A 03/21/2012   Procedure: CARDIOVERSION;  Surgeon: Birdie Riddle, MD;  Location: [place] ENDOSCOPY;  Service: Cardiovascular;  Laterality: N/A;   CARDIOVERSION N/A 04/24/2013   Procedure: CARDIOVERSION;  Surgeon: Dorothy Spark, MD;  Location: [facility] ENDOSCOPY;  Service: Cardiovascular;  Laterality: N/A;   CARDIOVERSION N/A 06/27/2015   Procedure: CARDIOVERSION;  Surgeon: Larey Dresser, MD;  Location: [place];  Service: Cardiovascular;  Laterality: N/A;   CATARACT EXTRACTION     COLONOSCOPY W/ POLYPECTOMY     ELECTROPHYSIOLOGIC STUDY N/A 07/30/2015   Procedure: Atrial Fibrillation Ablation;  Surgeon: Thompson Grayer, MD;  Location: [place] CV LAB;  Service: Cardiovascular;  Laterality: N/A;   EXTRACORPOREAL SHOCK WAVE LITHOTRIPSY Right 10/29/2016   Procedure: RIGHT EXTRACORPOREAL SHOCK WAVE LITHOTRIPSY (ESWL);  Surgeon: Alexis Frock, MD;  [facility];  Service: Urology;  Laterality: Right;   EYE SURGERY Right    cataract   FEMUR FRACTURE SURGERY     HERNIA REPAIR  07/06/2011   INGUINAL HERNIA REPAIR Right 07/28/2013  Procedure: RIGHT INGUINAL HERNIA REPAIR;  Surgeon: Imogene Burn. Georgette Dover, MD;  Location: [place];  Service: General;  Laterality: Right;   INGUINAL HERNIA REPAIR Right 09/22/2016   Procedure: LAPAROSCOPIC RIGHT INGUINAL HERNIA;  Surgeon: Kieth Brightly Arta Bruce, MD;  [facility];  Service: General;  Laterality: Right;  With MESH   INSERTION OF MESH Right 07/28/2013   Procedure: INSERTION OF MESH;  Surgeon: Imogene Burn. Georgette Dover, MD;  Location: [place];  Service: General;  Laterality: Right;   KNEE ARTHROSCOPY     left   REPLACEMENT TOTAL KNEE Left    ROTATOR CUFF REPAIR     right   ROTATOR CUFF REPAIR Left 03/08/2014   DR SUPPLE   SHOULDER  ARTHROSCOPY WITH ROTATOR CUFF REPAIR AND SUBACROMIAL DECOMPRESSION Left 03/08/2014   Procedure: LEFT SHOULDER ARTHROSCOPY WITH ROTATOR CUFF REPAIR/SUBACROMIAL DECOMPRESSION/DISTAL CLAVICLE RESECTION;  Surgeon: [place] Shutter, MD;  Location: [place];  Service: Orthopedics;  Laterality: Left;   SVT ABLATION N/A 01/23/2019   Procedure: SVT ABLATION;  Surgeon: Evans Lance, MD;  Location: [place] CV LAB;  Service: Cardiovascular;  Laterality: N/A;   TEE WITHOUT CARDIOVERSION N/A 03/21/2012   Procedure: TRANSESOPHAGEAL ECHOCARDIOGRAM (TEE);  Surgeon: Birdie Riddle, MD;  Location: [place];  Service: Cardiovascular;  Laterality: N/A;   TEE WITHOUT CARDIOVERSION N/A 04/24/2013   Procedure: TRANSESOPHAGEAL ECHOCARDIOGRAM (TEE);  Surgeon: Dorothy Spark, MD;  Location: [place];  Service: Cardiovascular;  Laterality: N/A;   TEE WITHOUT CARDIOVERSION N/A 07/29/2015   Procedure: TRANSESOPHAGEAL ECHOCARDIOGRAM (TEE);  Surgeon: Fay Records, MD;  Location: [place];  Service: Cardiovascular;  Laterality: N/A;   TONSILLECTOMY     TOTAL KNEE ARTHROPLASTY Right 05/24/2018   Procedure: RIGHT TOTAL KNEE ARTHROPLASTY;  Surgeon: Paralee Cancel, MD;  [facility];  Service: Orthopedics;  Laterality: Right;  70 mins    There were no vitals filed for this visit.    Subjective Assessment - 10/30/20 1012     Subjective He relays chronic LBP with stenosis making it difficult for him to walk. Trialed spinal stimulator but this dont provide enough relief. He does not get any physical exercise and he wants to find out what he can do to start exercising again and he is gaining weight. He has had recent fall last week and one 6 months ago where he caught his toe. He relays nothing was broken for this. He relays he is also intrested in aquatic therapy and he would like to lose 50 lbs.    Pertinent History PMH:Rt TKA,insomnia,anx and dep,HTN Lt leg shorter LLD    Diagnostic tests See chart for lumbar MRI  "canal stenosis, foraminal stenosis,DDD with enplate edema Y0-7 disc    Patient Stated Goals learn exercises to do to reduce pain and get more active    Currently in Pain? Yes    Pain Score 9    9/10 pain with standing, no pain resting   Pain Location Back    Pain Orientation Right;Left;Lower    Pain Descriptors / Indicators Aching    Pain Type Chronic pain    Pain Radiating Towards denies N/T or pain in his legs    Pain Onset More than a month ago    Pain Frequency Intermittent    Aggravating Factors  any standing activity    Pain Relieving Factors rest                [facility] PT Assessment - 10/30/20 0001  Assessment   Medical Diagnosis Chronic LBP with stenosis and chronic pain syndrome    Referring Provider (PT) Magnus Sinning, MD    Onset Date/Surgical Date --   chronic pain   Prior Therapy nothing recent      Precautions   Precaution Comments fall      Restrictions   Weight Bearing Restrictions No      Balance Screen   Has the patient fallen in the past 6 months Yes    How many times? 2    Has the patient had a decrease in activity level because of a fear of falling?  Yes    Is the patient reluctant to leave their home because of a fear of falling?  No      Home Ecologist residence      Prior Function   Level of Independence Independent with basic ADLs      Cognition   Overall Cognitive Status Within Functional Limits for tasks assessed      Observation/Other Assessments   Focus on Therapeutic Outcomes (FOTO)  37% functional intake, goal is 48%      Posture/Postural Control   Posture Comments slumped posture, Lt hip higher and Lateral trunk shift noted      ROM / Strength   AROM / PROM / Strength AROM;Strength      AROM   Overall AROM Comments lumbar ROM limited 50% all planes but extension limited by 75%, decreaesd Lt hip flexion ROM to 90 deg      Strength   Overall Strength Comments bilat leg strength grossly  WFL 4+      Special Tests   Other special tests negative SLR test bilat      Transfers   Comments needs min A with supine to sit transfer      Ambulation/Gait   Ambulation/Gait Yes    Gait Comments limited community ambulator due to pain, has slower antalgic gait and uses SPC in community, no AD for household ambulation      Standardized Balance Assessment   Standardized Balance Assessment Timed Up and Go Test      Timed Up and Go Test   Normal TUG (seconds) 15.93    TUG Comments no AD                        Objective measurements completed on examination: See above findings.                PT Education - 10/30/20 1102     Education Details PT plan of care, posture and shoe insert correction, HEP will be emailed to him as  he had to leave early before it was compiled.    Person(s) Educated Patient    Methods Explanation;Verbal cues    Comprehension Verbalized understanding;Need further instruction              PT Short Term Goals - 10/30/20 1113       PT SHORT TERM GOAL #1   Title He will be Independent and compliant with HEP    Time 4    Period Weeks    Status New    Target Date 11/27/20      PT SHORT TERM GOAL #2   Title He will report pain oveall decreased by 25%    Time 4    Period Weeks    Status New    Target Date 11/27/20  PT SHORT TERM GOAL #3   Title -               PT Long Term Goals - 10/30/20 1114       PT LONG TERM GOAL #1   Title Patient will stand/walk for 30 minutes with tolerable amount of pain in order to perfrom ADL's    Baseline 5 min    Time 8    Period Weeks    Status New    Target Date 12/25/20      PT LONG TERM GOAL #2   Title Pt will improve FOTO to 48% functional    Baseline 37%    Time 8    Period Weeks    Status New      PT LONG TERM GOAL #3   Title Pt will improve TUG to 13 seconds or less without AD to show improved balance and gait speed    Time 8    Period Weeks     Status New    Target Date 12/25/20      PT LONG TERM GOAL #4   Title He will be independent with final HEP and report consistent exercise and walking regimin.    Time 8    Period Weeks    Status New                    Plan - 10/30/20 1106     Clinical Impression Statement Pt presents with chronic low back pain with stenosis limiting his ROM and functional standing abilities. He also has Lt leg length descrepency and wears 1 inch insert in his shoe however this appears to have overcorrected and his left hip was much higher in standing so I provided him with 1/2 inch insert which improved his overall hip alignment. He also has decreased balance and gait speed, has had 2 prior falls and is at a risk of falling. He will benefit from skilled PT to address his funcitonal impairments listed below with proposed interventions listed below. He may also benefit from aquatic PT and he expresses interst in this. We will plan to begin land PT for now and may transfer him to our aquatic PT clinic in future.    Personal Factors and Comorbidities Comorbidity 3+    Comorbidities PMH:Rt TKA,insomnia,anx and dep,HTN,    Examination-Activity Limitations Carry;Dressing;Lift;Stand;Stairs;Squat;Sleep;Locomotion Level;Transfers    Examination-Participation Restrictions Cleaning;Community Activity;Shop;Yard Work    Merchant navy officer Evolving/Moderate complexity    Clinical Decision Making Moderate    Rehab Potential Good    PT Frequency 2x / week    PT Duration 8 weeks    PT Treatment/Interventions ADLs/Self Care Home Management;Aquatic Therapy;Electrical Stimulation;Cryotherapy;Moist Heat;Traction;Ultrasound;Therapeutic activities;Therapeutic exercise;Balance training;Neuromuscular re-education;Manual techniques;Passive range of motion;Dry needling;Joint Manipulations;Spinal Manipulations    PT Next Visit Plan did he get HEP that was emailed to him? begin bike and flexion based lumbar  program for stenosis    PT Home Exercise Plan Access Code: [email or web address]    Consulted and Agree with Plan of Care Patient             Patient will benefit from skilled therapeutic intervention in order to improve the following deficits and impairments:  Abnormal gait, Decreased activity tolerance, Decreased balance, Decreased coordination, Decreased endurance, Decreased mobility, Decreased range of motion, Decreased strength, Difficulty walking, Hypomobility, Postural dysfunction, Increased muscle spasms, Pain, Impaired flexibility, Improper body mechanics  Visit Diagnosis: Chronic bilateral low back pain, unspecified whether sciatica present  Other abnormalities of gait  and mobility  Abnormal posture  Muscle weakness (generalized)     Problem List Patient Active Problem List   Diagnosis Date Noted   Finger mass, right 10/17/2020   Olecranon bursitis 01/12/2020   Breast lump 03/29/2019   SVT (supraventricular tachycardia) ([place]) 01/21/2019   S/P right total knee arthroplasty 05/24/2018   Insomnia due to medical condition 12/23/2016   BPH (benign prostatic hyperplasia) 10/21/2016   Essential hypertension 10/21/2016   Depression, major, single episode, complete remission ([place]) 10/21/2016   Obesity (BMI 30-39.9) 03/07/2016   History of alcohol abuse 03/07/2016   History of colon polyps 11/18/2015   Paroxysmal atrial fibrillation (HCC)    Restless legs syndrome 02/08/2014   Generalized anxiety disorder 12/12/2013   Recurrent unilateral inguinal hernia 07/17/2013   Nonrheumatic mitral valve insufficiency 04/25/2013   Current use of long term anticoagulation    Obstructive sleep apnea 03/09/2013   Chronic bilateral low back pain with sciatica 03/21/2012   HLD (hyperlipidemia) 03/21/2012   Arthralgia of hip 03/21/2012   History of pulmonary embolism 03/19/2012   Bilateral inguinal hernia 61/54/8845   Uncomplicated asthma 73/34/4830   Anxiety state 07/18/2007    Debbe Odea, PT,DPT 10/30/2020, 11:43 AM  [facility] Physical Therapy [address] [place], [place], [address] Phone: [phone]   Fax:  [phone]  Name: Brandon [name]. MRN: [id number] Date of Birth: 11/26/1950

## 2020-11-15 ENCOUNTER — Other Ambulatory Visit: Payer: Self-pay | Admitting: Family Medicine

## 2020-11-26 ENCOUNTER — Encounter: Payer: Self-pay | Admitting: Physical Medicine and Rehabilitation

## 2020-11-26 DIAGNOSIS — M5416 Radiculopathy, lumbar region: Secondary | ICD-10-CM

## 2020-11-26 DIAGNOSIS — M48062 Spinal stenosis, lumbar region with neurogenic claudication: Secondary | ICD-10-CM

## 2020-12-19 ENCOUNTER — Other Ambulatory Visit: Payer: Self-pay | Admitting: Physical Medicine and Rehabilitation

## 2020-12-19 DIAGNOSIS — M48062 Spinal stenosis, lumbar region with neurogenic claudication: Secondary | ICD-10-CM

## 2020-12-23 NOTE — Progress Notes (Signed)
° ° °  SUBJECTIVE:   CHIEF COMPLAINT / HPI:   L Elbow Swelling Patient here with left elbow swelling. Symptoms started 2 days ago. Reports he took a nap Sunday and when he woke up his left elbow was swollen. It was mildly sore to touch at that time. The following day (yesterday) the swelling and soreness got worse and he noticed redness of the area. No known fever or chills. No known injury although he does lean on his elbows frequently.   Of note, he has a history of similar episode in January 2022- at that time was diagnosed with left olecranon bursitis. They attempted aspiration at that time but couldn't retrieve any fluid. CBC/BMP/uric acid were all unremarkable.  PERTINENT  PMH / PSH: paroxysmal a-fib, BPH, HTN, HLD, depression, hx of PE,   OBJECTIVE:   BP (!) 145/94    Pulse 80    Temp 100.1 F (37.8 C) (Oral)    Ht 6\' 4"  (1.93 m)    Wt 295 lb (133.8 kg)    SpO2 100%    BMI 35.91 kg/m   Gen: alert, well-appearing, NAD Resp: normal work of breathing MSK: L elbow- large, well-defined area of edema with slight overlying redness and mild tenderness to palpation. Increased warmth compared to right. Quarter sized area of superficial abrasion over L elbow as well. Normal ROM and strength. Neurovascularly intact.   Elbow bursa aspiration: Written consent was obtained after discussing risk and benefits associated with procedure. Timeout was conducted.  His elbow was cleaned with Betadine solution X3. Then, topical anesthetic spray was applied and a 22-gauge needle was inserted for aspiration. Approximately 14cc bloody fluid was obtained. He tolerated the procedure well without any immediate complications. A bandage and gauze/Coban dressing was applied.  ASSESSMENT/PLAN:   Elbow swelling, left Acute x2 days. Suspicious for olecranon bursitis given frequent resting on his elbows and history of the same. No hx of gout and uric acid level has been normal in the past. Given his low grade fever  (100.1 orally in the office today) and somewhat increased warmth with erythema, will rule out septic arthritis. No concern for cellulitis. Aspiration yielded bloody fluid so could also be a hematoma, especially in light of his anticoagulation w/Eliquis. -Aspiration performed, approx 65mL fluid removed -Fluid sent for cell count, crystal analysis, and gram stain/culture -Recommended supportive care w/Tylenol, ice, and compressive wrap -Return precautions reviewed    Alcus Dad, MD [place]

## 2020-12-24 ENCOUNTER — Other Ambulatory Visit: Payer: Self-pay

## 2020-12-24 ENCOUNTER — Ambulatory Visit (INDEPENDENT_AMBULATORY_CARE_PROVIDER_SITE_OTHER): Payer: Medicare Other | Admitting: Family Medicine

## 2020-12-24 VITALS — BP 145/94 | HR 80 | Temp 100.1°F | Ht 76.0 in | Wt 295.0 lb

## 2020-12-24 DIAGNOSIS — M48062 Spinal stenosis, lumbar region with neurogenic claudication: Secondary | ICD-10-CM | POA: Insufficient documentation

## 2020-12-24 DIAGNOSIS — M25422 Effusion, left elbow: Secondary | ICD-10-CM | POA: Diagnosis not present

## 2020-12-24 DIAGNOSIS — Z6835 Body mass index (BMI) 35.0-35.9, adult: Secondary | ICD-10-CM | POA: Insufficient documentation

## 2020-12-24 NOTE — Patient Instructions (Addendum)
It was great to meet you!  We are sending the fluid from your elbow for microscopic studies (to look for infection, signs of gout, etc). I will call you with the results.  In the meantime, you should take Tylenol as needed for pain or fever. You should avoid any trauma to the area (including resting on your elbows) and use a compressive wrap whenever possible.  If you develop worsening redness that's spreading throughout your arm, please let us know sooner.  Dr. [facility]   Elbow Bursitis Bursitis is swelling and pain at the tip of your elbow. This happens when fluid builds up in a sac under your skin (bursa). This may also be called olecranon bursitis. Follow these instructions at home: Medicines Take over-the-counter and prescription medicines only as told by your doctor. If you were prescribed an antibiotic, take it exactly as told by your doctor. Do not stop taking it even if you start to feel better. Managing pain, stiffness, and swelling  If told, put ice on your elbow: Put ice in a plastic bag. Place a towel between your skin and the bag. Leave the ice on for 20 minutes, 2-3 times a day. If your bursitis is caused by an injury, follow instructions from your doctor about: Resting your elbow. Wearing a bandage. Wear elbow pads or elbow wraps as needed. These help cushion your elbow. General instructions Avoid any activities that cause elbow pain. Ask your doctor what activities are safe for you. Keep all follow-up visits as told by your doctor. This is important. Contact a doctor if you have: A fever. Problems that do not get better with treatment. Pain or swelling that: Gets worse. Goes away and then comes back. Pus draining from your elbow. Get help right away if you have: Trouble moving your arm, hand, or fingers. Summary Bursitis is swelling and pain at the tip of the elbow. You may need to take medicine or put ice on your elbow. Contact your doctor  if your problems do not get better with treatment. This information is not intended to replace advice given to you by your health care provider. Make sure you discuss any questions you have with your health care provider. Document Revised: 05/31/2019 Document Reviewed: 06/28/2019 Elsevier Patient Education  [place].

## 2020-12-24 NOTE — Assessment & Plan Note (Addendum)
Acute x2 days. Suspicious for olecranon bursitis given frequent resting on his elbows and history of the same. No hx of gout and uric acid level has been normal in the past. Given his low grade fever (100.1 orally in the office today) and somewhat increased warmth with erythema, will rule out septic arthritis. No concern for cellulitis. Aspiration yielded bloody fluid so could also be a hematoma, especially in light of his anticoagulation w/Eliquis. -Aspiration performed, approx 18mL fluid removed -Fluid sent for cell count, crystal analysis, and gram stain/culture -Recommended supportive care w/Tylenol, ice, and compressive wrap

## 2020-12-25 DIAGNOSIS — N400 Enlarged prostate without lower urinary tract symptoms: Secondary | ICD-10-CM | POA: Diagnosis not present

## 2020-12-25 LAB — PSA: PSA: 1.64

## 2020-12-26 LAB — SYNOVIAL FLUID, CELL COUNT
Eos, Fluid: 1 %
Lining Cells, Synovial: 0 %
Lymphs, Fluid: 1 %
Macrophages Fld: 4 %
Nuc cell # Fld: 3332 cells/uL — ABNORMAL HIGH (ref 0–200)
Polys, Fluid: 94 %
RBC, Fluid: 19000 /uL

## 2020-12-27 DIAGNOSIS — M4696 Unspecified inflammatory spondylopathy, lumbar region: Secondary | ICD-10-CM | POA: Diagnosis not present

## 2020-12-30 LAB — GRAM STAIN/BODY FLUID CULTURE: Organism ID, Bacteria: NONE SEEN

## 2021-01-13 DIAGNOSIS — M47816 Spondylosis without myelopathy or radiculopathy, lumbar region: Secondary | ICD-10-CM | POA: Diagnosis not present

## 2021-01-21 ENCOUNTER — Other Ambulatory Visit: Payer: Self-pay

## 2021-01-21 ENCOUNTER — Ambulatory Visit (INDEPENDENT_AMBULATORY_CARE_PROVIDER_SITE_OTHER): Payer: Medicare Other | Admitting: Family Medicine

## 2021-01-21 VITALS — BP 108/64 | HR 63 | Ht 76.0 in | Wt 294.0 lb

## 2021-01-21 DIAGNOSIS — M7022 Olecranon bursitis, left elbow: Secondary | ICD-10-CM | POA: Diagnosis not present

## 2021-01-21 NOTE — Progress Notes (Signed)
° ° °  SUBJECTIVE:   CHIEF COMPLAINT / HPI:   Swollen left elbow - intermittent - really bothering him over the weekend - even with padding on his desk, the elbow - elbow gets red and hot - no systemic symptoms like fever, chills - no temporal or exposure pattern to swelling - no trauma to area - no elbow overuse, no tennis or pickle ball playing - right handed, puts more resting pressure on left arm/elbow - spends about 8 hours per day at a desk reading and doing keyboard work - also has to lean on counter while doing dishes due to spinal stenosis; puts great pressure on left elbow in order to use right hand - previously tapped at last appointment, results below:  No evidence of infection, gout, or pseudogout. Consistent with presumed olecranon bursitis +/- hematoma. He reports his elbow is essentially back to normal. No further intervention needed.  PERTINENT  PMH / PSH:  Patient Active Problem List   Diagnosis Date Noted   BMI 35.0-35.9,adult 12/24/2020   Elbow swelling, left 12/24/2020   Spinal stenosis, lumbar region with neurogenic claudication    Olecranon bursitis 01/12/2020   SVT (supraventricular tachycardia) (HCC) 01/21/2019   BPH (benign prostatic hyperplasia) 10/21/2016   Essential hypertension 10/21/2016   Depression, major, single episode, complete remission ([place]) 10/21/2016   Paroxysmal atrial fibrillation (HCC)    Restless legs syndrome 02/08/2014   Generalized anxiety disorder 12/12/2013   Current use of long term anticoagulation    Obstructive sleep apnea 03/09/2013   HLD (hyperlipidemia) 03/21/2012   History of pulmonary embolism 03/19/2012     OBJECTIVE:   BP 108/64    Pulse 63    Ht 6\' 4"  (1.93 m)    Wt 294 lb (133.4 kg)    SpO2 94%    BMI 35.79 kg/m   PHQ-9:  Depression screen [facility] 2/9 01/21/2021 12/24/2020 07/22/2020  Decreased Interest 0 0 0  Down, Depressed, Hopeless 0 0 0  PHQ - 2 Score 0 0 0  Altered sleeping 0 0 0  Tired, decreased energy 0 0  0  Change in appetite 0 0 0  Feeling bad or failure about yourself  0 0 0  Trouble concentrating 0 0 0  Moving slowly or fidgety/restless 0 0 0  Suicidal thoughts 0 0 0  PHQ-9 Score 0 0 0  Difficult doing work/chores Not difficult at all - -  Some recent data might be hidden     GAD-7: No flowsheet data found.   Physical Exam General: Awake, alert, oriented, no acute distress Respiratory: Unlabored respirations, speaking in full sentences, no respiratory distress Extremities: Moving all extremities spontaneously, swollen lateral elbow without overlying erythema or warmth, palpable mobile firm nodule overlying proximal ulnar prominence  ASSESSMENT/PLAN:   Olecranon bursitis Chronic, intermittent. Continues to recur, likely due to repeated pressure. Previously tapped, fluid negative for infection or gout. Will need referral to orthopedics for definitive treatment. Order placed.      Ezequiel Essex, MD [place]

## 2021-01-21 NOTE — Patient Instructions (Signed)
It was wonderful to meet you today. Thank you for allowing me to be a part of your care. Below is a short summary of what we discussed at your visit today:  Left elbow olecranon bursitis I have sent a referral to orthopedics, where they can perform a procedure that will remove the part that is getting inflamed.  The orthopedics office should call you directly to make an appointment.  In the meantime, you may alternate Tylenol and ibuprofen to help with the pain and swelling.  Also try ice over the area to reduce swelling.    Please bring all of your medications to every appointment!  If you have any questions or concerns, please do not hesitate to contact us via phone or MyChart message.   Ezequiel Essex, MD

## 2021-01-22 ENCOUNTER — Other Ambulatory Visit: Payer: Self-pay | Admitting: Physician Assistant

## 2021-01-22 NOTE — Assessment & Plan Note (Signed)
Chronic, intermittent. Continues to recur, likely due to repeated pressure. Previously tapped, fluid negative for infection or gout. Will need referral to orthopedics for definitive treatment. Order placed.

## 2021-01-30 DIAGNOSIS — M47816 Spondylosis without myelopathy or radiculopathy, lumbar region: Secondary | ICD-10-CM | POA: Diagnosis not present

## 2021-02-03 ENCOUNTER — Encounter: Payer: Self-pay | Admitting: Internal Medicine

## 2021-02-03 ENCOUNTER — Ambulatory Visit (INDEPENDENT_AMBULATORY_CARE_PROVIDER_SITE_OTHER): Payer: Medicare Other | Admitting: Internal Medicine

## 2021-02-03 ENCOUNTER — Other Ambulatory Visit: Payer: Self-pay

## 2021-02-03 VITALS — BP 136/84 | HR 78 | Temp 98.2°F | Ht 76.0 in | Wt 289.0 lb

## 2021-02-03 DIAGNOSIS — E785 Hyperlipidemia, unspecified: Secondary | ICD-10-CM

## 2021-02-03 DIAGNOSIS — L281 Prurigo nodularis: Secondary | ICD-10-CM | POA: Diagnosis not present

## 2021-02-03 DIAGNOSIS — J301 Allergic rhinitis due to pollen: Secondary | ICD-10-CM

## 2021-02-03 DIAGNOSIS — I10 Essential (primary) hypertension: Secondary | ICD-10-CM | POA: Diagnosis not present

## 2021-02-03 DIAGNOSIS — E876 Hypokalemia: Secondary | ICD-10-CM | POA: Diagnosis not present

## 2021-02-03 DIAGNOSIS — I480 Paroxysmal atrial fibrillation: Secondary | ICD-10-CM | POA: Diagnosis not present

## 2021-02-03 LAB — LIPID PANEL
Cholesterol: 135 mg/dL (ref 0–200)
HDL: 32.8 mg/dL — ABNORMAL LOW (ref >39.00)
LDL Cholesterol: 80 mg/dL (ref 0–99)
NonHDL: 102.4
Total CHOL/HDL Ratio: 4
Triglycerides: 114 mg/dL (ref 0.0–149.0)
VLDL: 22.8 mg/dL (ref 0.0–40.0)

## 2021-02-03 LAB — CBC WITH DIFFERENTIAL/PLATELET
Basophils Absolute: 0.1 10*3/uL (ref 0.0–0.1)
Basophils Relative: 0.5 % (ref 0.0–3.0)
Eosinophils Absolute: 0.5 10*3/uL (ref 0.0–0.7)
Eosinophils Relative: 4.5 % (ref 0.0–5.0)
HCT: 43.2 % (ref 39.0–52.0)
Hemoglobin: 14.5 g/dL (ref 13.0–17.0)
Lymphocytes Relative: 24.4 % (ref 12.0–46.0)
Lymphs Abs: 2.5 10*3/uL (ref 0.7–4.0)
MCHC: 33.5 g/dL (ref 30.0–36.0)
MCV: 87.9 fl (ref 78.0–100.0)
Monocytes Absolute: 1 10*3/uL (ref 0.1–1.0)
Monocytes Relative: 9.7 % (ref 3.0–12.0)
Neutro Abs: 6.1 10*3/uL (ref 1.4–7.7)
Neutrophils Relative %: 60.9 % (ref 43.0–77.0)
Platelets: 185 10*3/uL (ref 150.0–400.0)
RBC: 4.91 Mil/uL (ref 4.22–5.81)
RDW: 14.2 % (ref 11.5–15.5)
WBC: 10.1 10*3/uL (ref 4.0–10.5)

## 2021-02-03 LAB — HEPATIC FUNCTION PANEL
ALT: 12 U/L (ref 0–53)
AST: 15 U/L (ref 0–37)
Albumin: 4.2 g/dL (ref 3.5–5.2)
Alkaline Phosphatase: 80 U/L (ref 39–117)
Bilirubin, Direct: 0.1 mg/dL (ref 0.0–0.3)
Total Bilirubin: 0.6 mg/dL (ref 0.2–1.2)
Total Protein: 7.1 g/dL (ref 6.0–8.3)

## 2021-02-03 LAB — BASIC METABOLIC PANEL
BUN: 16 mg/dL (ref 6–23)
CO2: 34 mEq/L — ABNORMAL HIGH (ref 19–32)
Calcium: 9.4 mg/dL (ref 8.4–10.5)
Chloride: 97 mEq/L (ref 96–112)
Creatinine, Ser: 0.8 mg/dL (ref 0.40–1.50)
GFR: 88.34 mL/min (ref >60.00)
Glucose, Bld: 92 mg/dL (ref 70–99)
Potassium: 2.9 mEq/L — ABNORMAL LOW (ref 3.5–5.1)
Sodium: 141 mEq/L (ref 135–145)

## 2021-02-03 LAB — TSH: TSH: 2.01 u[IU]/mL (ref 0.35–5.50)

## 2021-02-03 MED ORDER — POTASSIUM CHLORIDE CRYS ER 20 MEQ PO TBCR: 20.0000 meq | EXTENDED_RELEASE_TABLET | Freq: Two times a day (BID) | ORAL | 0 refills | Status: DC

## 2021-02-03 MED ORDER — FLUTICASONE PROPIONATE 50 MCG/ACT NA SUSP
2.0000 | Freq: Every day | NASAL | 1 refills | Status: DC
Start: 2021-02-03 — End: 2022-02-12

## 2021-02-03 NOTE — Patient Instructions (Signed)

## 2021-02-03 NOTE — Progress Notes (Signed)
Subjective:  Patient ID: Brandon [name]., male    DOB: 16-Mar-1950  Age: 71 y.o. MRN: [id number]  CC: Allergic Rhinitis   This visit occurred during the SARS-CoV-2 public health emergency.  Safety protocols were in place, including screening questions prior to the visit, additional usage of staff PPE, and extensive cleaning of exam room while observing appropriate contact time as indicated for disinfecting solutions.    HPI [name] presents for f/up -  He complains of a several year hx of itchy skin that he scratches - this creates lesions that heal with scarring. The lesions are never painful and do not drain blood/pus.   History [name] has a past medical history of Anxiety, Arthritis, Benign neoplasm of colon, Benign prostatic hyperplasia with urinary obstruction, Bilateral inguinal hernia (05/29/2011), Bilateral pulmonary embolism ([place]) (3/14), Breast lump (03/29/2019), Calculus of kidney (10/21/2016), Cataract, Chronic bilateral low back pain with sciatica (03/21/2012), Chronic pain, Finger mass, right (10/17/2020), History of alcohol abuse (03/07/2016), History of colon polyps (11/18/2015), History of kidney stones, History of pulmonary embolism (03/19/2012), Hyperlipemia, Hypertension, Insomnia, Insomnia due to medical condition (12/23/2016), Major depressive disorder, recurrent episode ([place]), Mitral regurgitation (04/24/2013), Morbid obesity ([place]) (03/07/2016), Nonrheumatic mitral valve insufficiency (04/25/2013), Obesity, OSA (obstructive sleep apnea), Osteoporosis, Persistent atrial fibrillation ([place]), Recurrent unilateral inguinal hernia (07/17/2013), Restless leg syndrome, S/P right total knee arthroplasty (05/24/2018), Sciatica, Spinal stenosis, lumbar region with neurogenic claudication, SVT (supraventricular tachycardia) ([place]) (03/07/2016), Thrombus of left atrial appendage (08/07/9935), Uncomplicated asthma (1/69/6789), and Ventral hernia, unspecified, without mention of obstruction or  gangrene.   He has a past surgical history that includes Knee arthroscopy; Colonoscopy w/ polypectomy; Rotator cuff repair; Tonsillectomy; Cataract extraction; Hernia repair (07/06/2011); Replacement total knee (Left); TEE without cardioversion (N/A, 03/21/2012); Cardioversion (N/A, 03/21/2012); TEE without cardioversion (N/A, 04/24/2013); Cardioversion (N/A, 04/24/2013); Eye surgery (Right); Femur fracture surgery; Inguinal hernia repair (Right, 07/28/2013); Insertion of mesh (Right, 07/28/2013); Rotator cuff repair (Left, 03/08/2014); Shoulder arthroscopy with rotator cuff repair and subacromial decompression (Left, 03/08/2014); Cardioversion (N/A, 06/27/2015); TEE without cardioversion (N/A, 07/29/2015); Cardiac catheterization (N/A, 07/30/2015); Inguinal hernia repair (Right, 09/22/2016); Extracorporeal shock wave lithotripsy (Right, 10/29/2016); Total knee arthroplasty (Right, 05/24/2018); and SVT ABLATION (N/A, 01/23/2019).   His family history includes Asthma in his mother; CVA in an other family member; Cancer in his father and paternal grandmother; Diabetes in an other family member; Heart failure in his mother; Hypertension in his mother.He reports that he quit smoking about 47 years ago. His smoking use included cigarettes. He has a 2.50 pack-year smoking history. He has never used smokeless tobacco. He reports that he does not drink alcohol and does not use drugs.  Outpatient Medications Prior to Visit  Medication Sig Dispense Refill   amLODipine (NORVASC) 5 MG tablet TAKE 1 TABLET BY MOUTH EVERY DAY 90 tablet 3   atorvastatin (LIPITOR) 40 MG tablet Take 1 tablet (40 mg total) by mouth daily. 90 tablet 3   baclofen (LIORESAL) 10 MG tablet TAKE 1 TABLET BY MOUTH EVERY 8 HOURS AS NEEDED FOR MUSCLE SPASM 60 tablet 0   carvedilol (COREG) 25 MG tablet Take 1 tablet (25 mg total) by mouth 2 (two) times daily with a meal. Please keep upcoming appt in March 2023 with Dr. Lovena Le before anymore refills. Thank you 180  tablet 0   ELIQUIS 5 MG TABS tablet TAKE 1 TABLET BY MOUTH TWICE A DAY 60 tablet 5   escitalopram (LEXAPRO) 20 MG tablet Take 1  tablet (20 mg total) by mouth daily. 90 tablet 3   gabapentin (NEURONTIN) 800 MG tablet TAKE 1 TABLET IN THE EVENING AND 1 TABLET AT BEDTIME. 180 tablet 3   irbesartan (AVAPRO) 300 MG tablet Take 1 tablet (300 mg total) by mouth daily. 90 tablet 1   tamsulosin (FLOMAX) 0.4 MG CAPS capsule TAKE 1 CAPSULE (0.4 MG TOTAL) BY MOUTH 2 (TWO) TIMES DAILY. 180 capsule 3   No facility-administered medications prior to visit.    ROS Review of Systems  Constitutional:  Negative for chills, diaphoresis, fatigue and fever.  HENT:  Positive for congestion. Negative for nosebleeds, postnasal drip, rhinorrhea, sinus pressure, sinus pain, sore throat and trouble swallowing.   Eyes: Negative.   Respiratory:  Negative for cough, shortness of breath and wheezing.   Cardiovascular:  Negative for chest pain, palpitations and leg swelling.  Gastrointestinal:  Negative for abdominal pain, constipation, diarrhea, nausea and vomiting.  Endocrine: Negative.   Genitourinary: Negative.  Negative for difficulty urinating.  Musculoskeletal: Negative.  Negative for arthralgias and myalgias.  Skin:  Positive for color change and rash. Negative for wound.  Neurological:  Negative for dizziness, weakness and light-headedness.  Hematological:  Negative for adenopathy. Does not bruise/bleed easily.  Psychiatric/Behavioral: Negative.     Objective:  BP 136/84 (BP Location: Right Arm, Patient Position: Sitting, Cuff Size: Large)    Pulse 78    Temp 98.2 F (36.8 C) (Oral)    Ht 6\' 4"  (1.93 m)    Wt 289 lb (131.1 kg)    SpO2 94%    BMI 35.18 kg/m   Physical Exam Vitals reviewed.  HENT:     Nose: Congestion present. No mucosal edema or rhinorrhea.     Right Nostril: No epistaxis or septal hematoma.     Left Nostril: No foreign body or epistaxis.     Mouth/Throat:     Mouth: Mucous membranes are  moist.  Eyes:     General: No scleral icterus.    Conjunctiva/sclera: Conjunctivae normal.  Cardiovascular:     Rate and Rhythm: Normal rate and regular rhythm.     Heart sounds: No murmur heard. Pulmonary:     Effort: Pulmonary effort is normal.     Breath sounds: No stridor. No wheezing, rhonchi or rales.  Abdominal:     General: Abdomen is flat.     Palpations: There is no mass.     Tenderness: There is no abdominal tenderness. There is no guarding.     Hernia: No hernia is present.  Musculoskeletal:        General: Normal range of motion.     Cervical back: Neck supple.  Lymphadenopathy:     Cervical: No cervical adenopathy.  Skin:    General: Skin is warm.     Findings: Abrasion and lesion present. No rash. Rash is not crusting, macular, nodular, papular, purpuric, pustular, scaling, urticarial or vesicular.     Comments: There are numerous lesions.  Some have healed with PIPA.  Some are excoriated.  There are no pustules, vesicles, or bullae. There is one nodule. See photos.  Neurological:     General: No focal deficit present.     Mental Status: He is alert.    Lab Results  Component Value Date   WBC 10.1 02/03/2021   HGB 14.5 02/03/2021   HCT 43.2 02/03/2021   PLT 185.0 02/03/2021   GLUCOSE 92 02/03/2021   CHOL 135 02/03/2021   TRIG 114.0 02/03/2021   HDL 32.80 (  L) 02/03/2021   LDLDIRECT 138 (H) 10/21/2016   LDLCALC 80 02/03/2021   ALT 12 02/03/2021   AST 15 02/03/2021   NA 141 02/03/2021   K 2.9 (L) 02/03/2021   CL 97 02/03/2021   CREATININE 0.80 02/03/2021   BUN 16 02/03/2021   CO2 34 (H) 02/03/2021   TSH 2.01 02/03/2021   PSA 1.64 12/25/2020   INR 2.1 04/01/2018   HGBA1C 5.4 03/09/2018      Assessment & Plan:   Khanh was seen today for allergic rhinitis .  Diagnoses and all orders for this visit:  Paroxysmal atrial fibrillation ([place])- He has good rate/rhythm control. -     TSH; Future -     TSH  Essential hypertension- His blood pressure is  adequately well controlled but he has hypokalemia.  Will replace the potassium and screen for hyperaldosteronism. -     Basic metabolic panel; Future -     Hepatic function panel; Future -     CBC with Differential/Platelet; Future -     CBC with Differential/Platelet -     Hepatic function panel -     Basic metabolic panel -     Aldosterone + renin activity w/ ratio; Future -     potassium chloride SA (KLOR-CON M) 20 MEQ tablet; Take 1 tablet (20 mEq total) by mouth 2 (two) times daily.  Hyperlipidemia LDL goal <130- LDL goal achieved. Doing well on the statin  -     Lipid panel; Future -     Hepatic function panel; Future -     TSH; Future -     TSH -     Hepatic function panel -     Lipid panel  Seasonal allergic rhinitis due to pollen -     fluticasone (FLONASE) 50 MCG/ACT nasal spray; Place 2 sprays into both nostrils daily.  Chronic hypokalemia -     Aldosterone + renin activity w/ ratio; Future -     potassium chloride SA (KLOR-CON M) 20 MEQ tablet; Take 1 tablet (20 mEq total) by mouth 2 (two) times daily.  Prurigo nodularis -     capsaicin (ZOSTRIX) 0.025 % cream; Apply topically 2 (two) times daily.   I am having Javyn B. Millstein Jr. start on fluticasone, potassium chloride SA, and capsaicin. I am also having him maintain his irbesartan, escitalopram, amLODipine, gabapentin, atorvastatin, Eliquis, baclofen, tamsulosin, and carvedilol.  Meds ordered this encounter  Medications   fluticasone (FLONASE) 50 MCG/ACT nasal spray    Sig: Place 2 sprays into both nostrils daily.    Dispense:  48 g    Refill:  1   potassium chloride SA (KLOR-CON M) 20 MEQ tablet    Sig: Take 1 tablet (20 mEq total) by mouth 2 (two) times daily.    Dispense:  180 tablet    Refill:  0   capsaicin (ZOSTRIX) 0.025 % cream    Sig: Apply topically 2 (two) times daily.    Dispense:  120 g    Refill:  3     Follow-up: Return in about 6 months (around 08/03/2021).  Scarlette Calico, MD

## 2021-02-04 ENCOUNTER — Encounter: Payer: Self-pay | Admitting: Internal Medicine

## 2021-02-04 DIAGNOSIS — L281 Prurigo nodularis: Secondary | ICD-10-CM | POA: Insufficient documentation

## 2021-02-04 MED ORDER — CAPSAICIN 0.025 % EX CREA: TOPICAL_CREAM | Freq: Two times a day (BID) | CUTANEOUS | 3 refills | Status: DC

## 2021-02-11 DIAGNOSIS — M4696 Unspecified inflammatory spondylopathy, lumbar region: Secondary | ICD-10-CM | POA: Diagnosis not present

## 2021-02-27 ENCOUNTER — Telehealth: Payer: Self-pay | Admitting: *Deleted

## 2021-02-27 DIAGNOSIS — M47816 Spondylosis without myelopathy or radiculopathy, lumbar region: Secondary | ICD-10-CM | POA: Diagnosis not present

## 2021-02-27 NOTE — Telephone Encounter (Signed)
° °  Pre-operative Risk Assessment    Patient Name: Brandon [name].  DOB: January 18, 1950 MRN: [id number]      Request for Surgical Clearance    Procedure:   B/L L3/L4/L5 LUMBAR RADIOFREQUENCY ABLATION  Date of Surgery:  Clearance TBD                                 Surgeon:  DR. Mina Marble Surgeon's Group or Practice Name:  [facility] Phone number:  [phone] Fax number:  [phone] ATTN: Lattie Corns   Type of Clearance Requested:   - Medical  - Pharmacy:  Hold Apixaban (Eliquis) x 2-3 DAYS PRIOR   Type of Anesthesia:  Local    Additional requests/questions:    [name]   02/27/2021, 12:44 PM

## 2021-02-28 NOTE — Telephone Encounter (Signed)
Patient with diagnosis of A Fib on Eliquis for anticoagulation.    Procedure: B/L L3/L4/L5 LUMBAR RADIOFREQUENCY ABLATION Date of procedure: TBD  CHA2DS2-VASc Score = 4  This indicates a 4.8% annual risk of stroke. The patient's score is based upon: CHF History: 0 HTN History: 1 Diabetes History: 0 Stroke History: 2 (hx of bilateral PE and LA thrombus) Vascular Disease History: 0 Age Score: 1 Gender Score: 0  CrCl 123 mL/min using adjusted body weight Platelet count 185K  Will route to Dr Lovena Le for input.  Dr Lovena Le, patient was previously on warfarin and was bridged with Lovenox when he held for 5 days due to LA thrombus and bilateral PE.  Patient would need to hold for 3 days for this procedure and we do not typically bridge patients on Eliquis.  Thoughts on this case?

## 2021-03-02 NOTE — Telephone Encounter (Signed)
I would not bridge. Hold Eliquis and restart when risk is acceptable. GT

## 2021-03-02 NOTE — Telephone Encounter (Signed)
Please see note from Dr. Lovena Le

## 2021-03-03 NOTE — Telephone Encounter (Signed)
Pt has appt with Dr. Lovena Le 03/07/21. Pre op clearance needed has ben added to appt notes. Once Dr. Lovena Le has cleared the pt he will have his nurse fax over clearance notes and any recommendations. I will forward notes to MD for upcoming appt. Will send FYI to requesting office the pt has appt 03/07/21.

## 2021-03-03 NOTE — Telephone Encounter (Signed)
Primary Cardiologist:Gregg Lovena Le, MD  Chart reviewed as part of pre-operative protocol coverage. Because of Brandon B Gacek Jr.'s past medical history and time since last visit, he/she will require a follow-up visit in order to better assess preoperative cardiovascular risk.  Pre-op covering staff: - Please schedule appointment and call patient to inform them. - Please contact requesting surgeon's office via preferred method (i.e, phone, fax) to inform them of need for appointment prior to surgery.  If applicable, this message will also be routed to pharmacy pool and/or primary cardiologist for input on holding anticoagulant/antiplatelet agent as requested below so that this information is available at time of patient's appointment.   Deberah Pelton, NP  03/03/2021, 10:45 AM

## 2021-03-07 ENCOUNTER — Encounter: Payer: Self-pay | Admitting: Internal Medicine

## 2021-03-07 ENCOUNTER — Ambulatory Visit (INDEPENDENT_AMBULATORY_CARE_PROVIDER_SITE_OTHER): Payer: Medicare Other | Admitting: Internal Medicine

## 2021-03-07 ENCOUNTER — Other Ambulatory Visit: Payer: Self-pay

## 2021-03-07 VITALS — BP 148/90 | HR 74 | Ht 76.0 in | Wt 292.0 lb

## 2021-03-07 DIAGNOSIS — I480 Paroxysmal atrial fibrillation: Secondary | ICD-10-CM | POA: Diagnosis not present

## 2021-03-07 DIAGNOSIS — I10 Essential (primary) hypertension: Secondary | ICD-10-CM

## 2021-03-07 DIAGNOSIS — I471 Supraventricular tachycardia: Secondary | ICD-10-CM

## 2021-03-07 NOTE — Patient Instructions (Addendum)
Medication Instructions:  ?Your physician recommends that you continue on your current medications as directed. Please refer to the Current Medication list given to you today. ? ?Labwork: ?None ordered. ? ?Testing/Procedures: ?None ordered. ? ?Follow-Up: ?Your physician wants you to follow-up in: one year with [name], MD  ?You will receive a reminder letter in the mail two months in advance. If you don't receive a letter, please call our office to schedule the follow-up appointment. ? ? ?Any Other Special Instructions Will Be Listed Below (If Applicable). ? ?If you need a refill on your cardiac medications before your next appointment, please call your pharmacy.  ? ? ? ? ?

## 2021-03-07 NOTE — Progress Notes (Signed)
HPI Mr. Agne returns today for followup. He is a pleasant 71 yo man with a h/o SVT who would then go into atrial fib. He underwent EP study and catheter ablation of easily inducible AVNRT. He has not had any heart racing in the interim. He is pending spine ablation. He denies chest pain or sob or palpitations.  He notes a low potassium. He admits to consuming too much caffeine. Allergies  Allergen Reactions   Ace Inhibitors Cough   Albuterol Other (See Comments)    Racing heart     Current Outpatient Medications  Medication Sig Dispense Refill   amLODipine (NORVASC) 5 MG tablet TAKE 1 TABLET BY MOUTH EVERY DAY 90 tablet 3   atorvastatin (LIPITOR) 40 MG tablet Take 1 tablet (40 mg total) by mouth daily. 90 tablet 3   baclofen (LIORESAL) 10 MG tablet TAKE 1 TABLET BY MOUTH EVERY 8 HOURS AS NEEDED FOR MUSCLE SPASM 60 tablet 0   capsaicin (ZOSTRIX) 0.025 % cream Apply topically 2 (two) times daily. 120 g 3   carvedilol (COREG) 25 MG tablet Take 1 tablet (25 mg total) by mouth 2 (two) times daily with a meal. Please keep upcoming appt in March 2023 with Dr. Lovena Le before anymore refills. Thank you 180 tablet 0   ELIQUIS 5 MG TABS tablet TAKE 1 TABLET BY MOUTH TWICE A DAY 60 tablet 5   escitalopram (LEXAPRO) 20 MG tablet Take 1 tablet (20 mg total) by mouth daily. 90 tablet 3   fluticasone (FLONASE) 50 MCG/ACT nasal spray Place 2 sprays into both nostrils daily. 48 g 1   gabapentin (NEURONTIN) 800 MG tablet TAKE 1 TABLET IN THE EVENING AND 1 TABLET AT BEDTIME. 180 tablet 3   irbesartan (AVAPRO) 300 MG tablet Take 1 tablet (300 mg total) by mouth daily. 90 tablet 1   MYRBETRIQ 25 MG TB24 tablet Take 25 mg by mouth daily.     potassium chloride SA (KLOR-CON M) 20 MEQ tablet Take 1 tablet (20 mEq total) by mouth 2 (two) times daily. 180 tablet 0   tamsulosin (FLOMAX) 0.4 MG CAPS capsule TAKE 1 CAPSULE (0.4 MG TOTAL) BY MOUTH 2 (TWO) TIMES DAILY. 180 capsule 3   No current  facility-administered medications for this visit.     Past Medical History:  Diagnosis Date   Anxiety    Arthritis    Benign neoplasm of colon    Benign prostatic hyperplasia with urinary obstruction    Bilateral inguinal hernia 05/29/2011   Bilateral pulmonary embolism ([place]) 3/14   admitted to [facility],  treated with Xarelto   Breast lump 03/29/2019   Calculus of kidney 10/21/2016   Cataract    Chronic bilateral low back pain with sciatica 03/21/2012   Chronic pain    Finger mass, right 10/17/2020   History of alcohol abuse 03/07/2016   History of colon polyps 11/18/2015   History of kidney stones    History of pulmonary embolism 03/19/2012   Hyperlipemia    Hypertension    Insomnia    Insomnia due to medical condition 12/23/2016   Polyuria secondary to BPH   Major depressive disorder, recurrent episode ([place])    Mitral regurgitation 04/24/2013   Mild by TEE   Morbid obesity ([place]) 03/07/2016   Nonrheumatic mitral valve insufficiency 04/25/2013   Mild by TEE  Overview:  Overview:  Mild by TEE Overview:  Overview:  Overview:  Mild by TEE Overview:  Overview:  Mild by TEE  Obesity    OSA (obstructive sleep apnea)    noncompliant with CPAP.  07/27/13- awaiting a CPAP- unable to tolerate mask   Osteoporosis    Persistent atrial fibrillation (HCC)    Recurrent unilateral inguinal hernia 07/17/2013   Restless leg syndrome    takes gabapentin   S/P right total knee arthroplasty 05/24/2018   Sciatica    Spinal stenosis, lumbar region with neurogenic claudication    SVT (supraventricular tachycardia) ([place]) 03/07/2016   Thrombus of left atrial appendage 08/08/373   Uncomplicated asthma 4/36/0677   Overview:  Overview:  Overview:  Overview:  Qualifier: Diagnosis of  By: Sherren Mocha MD, Jory Ee Overview:  Overview:  Qualifier: Diagnosis of  By: Sherren Mocha MD, Jory Ee   Ventral hernia, unspecified, without mention of obstruction or gangrene    right abdominal wall    ROS:   All systems reviewed  and negative except as noted in the HPI.   Past Surgical History:  Procedure Laterality Date   CARDIOVERSION N/A 03/21/2012   Procedure: CARDIOVERSION;  Surgeon: Birdie Riddle, MD;  Location: [facility] ENDOSCOPY;  Service: Cardiovascular;  Laterality: N/A;   CARDIOVERSION N/A 04/24/2013   Procedure: CARDIOVERSION;  Surgeon: Dorothy Spark, MD;  Location: [facility] ENDOSCOPY;  Service: Cardiovascular;  Laterality: N/A;   CARDIOVERSION N/A 06/27/2015   Procedure: CARDIOVERSION;  Surgeon: Larey Dresser, MD;  Location: [place];  Service: Cardiovascular;  Laterality: N/A;   CATARACT EXTRACTION     COLONOSCOPY W/ POLYPECTOMY     ELECTROPHYSIOLOGIC STUDY N/A 07/30/2015   Procedure: Atrial Fibrillation Ablation;  Surgeon: Thompson Grayer, MD;  Location: [place] CV LAB;  Service: Cardiovascular;  Laterality: N/A;   EXTRACORPOREAL SHOCK WAVE LITHOTRIPSY Right 10/29/2016   Procedure: RIGHT EXTRACORPOREAL SHOCK WAVE LITHOTRIPSY (ESWL);  Surgeon: Alexis Frock, MD;  [facility];  Service: Urology;  Laterality: Right;   EYE SURGERY Right    cataract   FEMUR FRACTURE SURGERY     HERNIA REPAIR  07/06/2011   INGUINAL HERNIA REPAIR Right 07/28/2013   Procedure: RIGHT INGUINAL HERNIA REPAIR;  Surgeon: Imogene Burn. Georgette Dover, MD;  Location: [place];  Service: General;  Laterality: Right;   INGUINAL HERNIA REPAIR Right 09/22/2016   Procedure: LAPAROSCOPIC RIGHT INGUINAL HERNIA;  Surgeon: Kieth Brightly Arta Bruce, MD;  [facility];  Service: General;  Laterality: Right;  With MESH   INSERTION OF MESH Right 07/28/2013   Procedure: INSERTION OF MESH;  Surgeon: Imogene Burn. Georgette Dover, MD;  Location: [place];  Service: General;  Laterality: Right;   KNEE ARTHROSCOPY     left   REPLACEMENT TOTAL KNEE Left    ROTATOR CUFF REPAIR     right   ROTATOR CUFF REPAIR Left 03/08/2014   DR SUPPLE   SHOULDER ARTHROSCOPY WITH ROTATOR CUFF REPAIR AND SUBACROMIAL DECOMPRESSION Left 03/08/2014   Procedure: LEFT SHOULDER ARTHROSCOPY WITH  ROTATOR CUFF REPAIR/SUBACROMIAL DECOMPRESSION/DISTAL CLAVICLE RESECTION;  Surgeon: Marin Shutter, MD;  Location: [place];  Service: Orthopedics;  Laterality: Left;   SVT ABLATION N/A 01/23/2019   Procedure: SVT ABLATION;  Surgeon: Evans Lance, MD;  Location: [place] CV LAB;  Service: Cardiovascular;  Laterality: N/A;   TEE WITHOUT CARDIOVERSION N/A 03/21/2012   Procedure: TRANSESOPHAGEAL ECHOCARDIOGRAM (TEE);  Surgeon: Birdie Riddle, MD;  Location: [place];  Service: Cardiovascular;  Laterality: N/A;   TEE WITHOUT CARDIOVERSION N/A 04/24/2013   Procedure: TRANSESOPHAGEAL ECHOCARDIOGRAM (TEE);  Surgeon: Dorothy Spark, MD;  Location: [place];  Service: Cardiovascular;  Laterality: N/A;  TEE WITHOUT CARDIOVERSION N/A 07/29/2015   Procedure: TRANSESOPHAGEAL ECHOCARDIOGRAM (TEE);  Surgeon: Fay Records, MD;  Location: [place];  Service: Cardiovascular;  Laterality: N/A;   TONSILLECTOMY     TOTAL KNEE ARTHROPLASTY Right 05/24/2018   Procedure: RIGHT TOTAL KNEE ARTHROPLASTY;  Surgeon: Paralee Cancel, MD;  [facility];  Service: Orthopedics;  Laterality: Right;  10 mins     Family History  Problem Relation Age of Onset   Cancer Father        bone   Heart failure Mother    Hypertension Mother    Asthma Mother    Cancer Paternal Grandmother        ovarian   CVA Other        Fam Hx of multiple myeloma   Diabetes Other        Fam Hx of DM     Social History   Socioeconomic History   Marital status: Married    Spouse name: Not on file   Number of children: Not on file   Years of education: Not on file   Highest education level: Not on file  Occupational History   Not on file  Tobacco Use   Smoking status: Former    Packs/day: 0.50    Years: 5.00    Pack years: 2.50    Types: Cigarettes    Quit date: 32    Years since quitting: 47.2   Smokeless tobacco: Never  Vaping Use   Vaping Use: Never used  Substance and Sexual Activity   Alcohol use: No     Comment: Former EtOH abuse, stopped 06/2016   Drug use: No    Comment: negative hx for IV drug abuse   Sexual activity: Not on file  Other Topics Concern   Not on file  Social History Narrative   Lives with wife in [place]   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   Prior Nature conservation officer   Wife lives in town (still married but lives separate)   Has biological daughter in [place] ([place])   Social Determinants of Health   Financial Resource Strain: Not on file  Food Insecurity: Not on file  Transportation Needs: Not on file  Physical Activity: Not on file  Stress: Not on file  Social Connections: Not on file  Intimate Partner Violence: Not on file     BP (!) 148/90    Pulse 74    Ht _0  (1.93 m)    Wt 292 lb (132.5 kg)    SpO2 92%    BMI 35.54 kg/m   Physical Exam:  Well appearing overweight man, NAD HEENT: Unremarkable Neck:  No JVD, no thyromegally Lymphatics:  No adenopathy Back:  No CVA tenderness Lungs:  Clear with no wheezes HEART:  Regular rate rhythm, no murmurs, no rubs, no clicks Abd:  soft, positive bowel sounds, no organomegally, no rebound, no guarding Ext:  2 plus pulses, no edema, no cyanosis, no clubbing Skin:  No rashes no nodules Neuro:  CN II through XII intact, motor grossly intact  EKG - nsr   Assess/Plan:   1. SVT - he is doing well, after catheter ablation. He will undergo watchful waiting. 2. PAF - it is unclear as to whether his atrial fib occurred only with SVT or indiependent of SVT. For now I would recommend watchful waiting. I offered him the option of stopping eliquis but he would like to continue for now. 3. HTN - he will continue his ARB and amlodipine.  4. Dyslipidemia - he will continue his lipitor. I have encouraged the patient to increase his physical activity. 5. Preoperative eval - he is pending spinal ablation and he may proceed. He can stop the eliquis 3 days before the procedure as the half life is 6 hours. He is an acceptable risk.   Mikle [email or web address].D.

## 2021-03-10 NOTE — Telephone Encounter (Signed)
Patient has been seen and preoperative eval performed. GT ?

## 2021-03-13 DIAGNOSIS — M47816 Spondylosis without myelopathy or radiculopathy, lumbar region: Secondary | ICD-10-CM | POA: Diagnosis not present

## 2021-03-31 DIAGNOSIS — M47816 Spondylosis without myelopathy or radiculopathy, lumbar region: Secondary | ICD-10-CM | POA: Diagnosis not present

## 2021-04-20 ENCOUNTER — Other Ambulatory Visit: Payer: Self-pay | Admitting: Internal Medicine

## 2021-04-23 ENCOUNTER — Other Ambulatory Visit: Payer: Self-pay | Admitting: Internal Medicine

## 2021-04-23 DIAGNOSIS — M47816 Spondylosis without myelopathy or radiculopathy, lumbar region: Secondary | ICD-10-CM | POA: Diagnosis not present

## 2021-04-23 NOTE — Telephone Encounter (Signed)
Prescription refill request for Eliquis received. ?Indication: PAF ?Last office visit: 03/07/21  Beckie Salts MD ?Scr: 0.80 on 02/03/21 ?Age: 71 ?Weight: 132.5kg ? ?Based on above findings Eliquis '5mg'$  twice daily is the appropriate dose.  Refill approved. ? ?

## 2021-05-05 ENCOUNTER — Other Ambulatory Visit: Payer: Self-pay | Admitting: Internal Medicine

## 2021-05-05 DIAGNOSIS — E876 Hypokalemia: Secondary | ICD-10-CM

## 2021-05-05 DIAGNOSIS — I10 Essential (primary) hypertension: Secondary | ICD-10-CM

## 2021-05-15 ENCOUNTER — Other Ambulatory Visit: Payer: Self-pay | Admitting: Internal Medicine

## 2021-05-15 ENCOUNTER — Telehealth: Payer: Self-pay | Admitting: Internal Medicine

## 2021-05-15 DIAGNOSIS — F331 Major depressive disorder, recurrent, moderate: Secondary | ICD-10-CM

## 2021-05-15 MED ORDER — ESCITALOPRAM OXALATE 20 MG PO TABS: 20.0000 mg | ORAL_TABLET | Freq: Every day | ORAL | 3 refills | Status: DC

## 2021-05-15 NOTE — Telephone Encounter (Signed)
1.Medication Requested: ?escitalopram (LEXAPRO) 20 MG tablet ?2. Pharmacy (Name, Street, [place]): [id number]  [phone] ?Fax:  [phone] ?  ? ?3. On Med List: yes ? ?4. Last Visit with PCP: ? ?5. Next visit date with PCP: ? ? ?Agent: Please be advised that RX refills may take up to 3 business days. We ask that you follow-up with your pharmacy.  ?

## 2021-05-19 ENCOUNTER — Other Ambulatory Visit: Payer: Self-pay | Admitting: Internal Medicine

## 2021-05-26 DIAGNOSIS — M47816 Spondylosis without myelopathy or radiculopathy, lumbar region: Secondary | ICD-10-CM | POA: Diagnosis not present

## 2021-06-04 ENCOUNTER — Encounter (HOSPITAL_COMMUNITY): Payer: Self-pay

## 2021-06-04 ENCOUNTER — Other Ambulatory Visit: Payer: Self-pay

## 2021-06-04 ENCOUNTER — Inpatient Hospital Stay (HOSPITAL_COMMUNITY)
Admission: EM | Admit: 2021-06-04 | Discharge: 2021-06-06 | DRG: 641 | Disposition: A | Discharge status: Home Health | Payer: Medicare Other | Attending: Family Medicine | Admitting: Family Medicine

## 2021-06-04 ENCOUNTER — Emergency Department (HOSPITAL_COMMUNITY): Payer: Medicare Other

## 2021-06-04 DIAGNOSIS — G4733 Obstructive sleep apnea (adult) (pediatric): Secondary | ICD-10-CM | POA: Diagnosis present

## 2021-06-04 DIAGNOSIS — Z8249 Family history of ischemic heart disease and other diseases of the circulatory system: Secondary | ICD-10-CM

## 2021-06-04 DIAGNOSIS — F325 Major depressive disorder, single episode, in full remission: Secondary | ICD-10-CM | POA: Diagnosis present

## 2021-06-04 DIAGNOSIS — Z86711 Personal history of pulmonary embolism: Secondary | ICD-10-CM | POA: Diagnosis present

## 2021-06-04 DIAGNOSIS — M25552 Pain in left hip: Secondary | ICD-10-CM

## 2021-06-04 DIAGNOSIS — I10 Essential (primary) hypertension: Secondary | ICD-10-CM | POA: Diagnosis not present

## 2021-06-04 DIAGNOSIS — M1612 Unilateral primary osteoarthritis, left hip: Secondary | ICD-10-CM | POA: Diagnosis present

## 2021-06-04 DIAGNOSIS — I480 Paroxysmal atrial fibrillation: Secondary | ICD-10-CM | POA: Diagnosis present

## 2021-06-04 DIAGNOSIS — Z79899 Other long term (current) drug therapy: Secondary | ICD-10-CM | POA: Diagnosis not present

## 2021-06-04 DIAGNOSIS — I471 Supraventricular tachycardia, unspecified: Secondary | ICD-10-CM | POA: Diagnosis present

## 2021-06-04 DIAGNOSIS — Z6836 Body mass index (BMI) 36.0-36.9, adult: Secondary | ICD-10-CM

## 2021-06-04 DIAGNOSIS — F329 Major depressive disorder, single episode, unspecified: Secondary | ICD-10-CM | POA: Diagnosis present

## 2021-06-04 DIAGNOSIS — E785 Hyperlipidemia, unspecified: Secondary | ICD-10-CM | POA: Diagnosis not present

## 2021-06-04 DIAGNOSIS — E876 Hypokalemia: Secondary | ICD-10-CM | POA: Diagnosis not present

## 2021-06-04 DIAGNOSIS — Z87891 Personal history of nicotine dependence: Secondary | ICD-10-CM | POA: Diagnosis not present

## 2021-06-04 DIAGNOSIS — M25559 Pain in unspecified hip: Secondary | ICD-10-CM | POA: Diagnosis present

## 2021-06-04 DIAGNOSIS — R9431 Abnormal electrocardiogram [ECG] [EKG]: Secondary | ICD-10-CM | POA: Diagnosis not present

## 2021-06-04 DIAGNOSIS — Z825 Family history of asthma and other chronic lower respiratory diseases: Secondary | ICD-10-CM

## 2021-06-04 DIAGNOSIS — Z96653 Presence of artificial knee joint, bilateral: Secondary | ICD-10-CM | POA: Diagnosis present

## 2021-06-04 DIAGNOSIS — Z7901 Long term (current) use of anticoagulants: Secondary | ICD-10-CM | POA: Diagnosis not present

## 2021-06-04 DIAGNOSIS — M5442 Lumbago with sciatica, left side: Secondary | ICD-10-CM

## 2021-06-04 DIAGNOSIS — R079 Chest pain, unspecified: Secondary | ICD-10-CM | POA: Diagnosis not present

## 2021-06-04 DIAGNOSIS — S0990XA Unspecified injury of head, initial encounter: Secondary | ICD-10-CM | POA: Diagnosis not present

## 2021-06-04 LAB — COMPREHENSIVE METABOLIC PANEL
ALT: 13 U/L (ref 0–44)
AST: 16 U/L (ref 15–41)
Albumin: 4 g/dL (ref 3.5–5.0)
Alkaline Phosphatase: 97 U/L (ref 38–126)
Anion gap: 10 (ref 5–15)
BUN: 9 mg/dL (ref 8–23)
CO2: 30 mmol/L (ref 22–32)
Calcium: 8.8 mg/dL — ABNORMAL LOW (ref 8.9–10.3)
Chloride: 102 mmol/L (ref 98–111)
Creatinine, Ser: 0.61 mg/dL (ref 0.61–1.24)
GFR, Estimated: >60 mL/min (ref >60)
Glucose, Bld: 127 mg/dL — ABNORMAL HIGH (ref 70–99)
Potassium: 2.6 mmol/L — CL (ref 3.5–5.1)
Sodium: 142 mmol/L (ref 135–145)
Total Bilirubin: 0.9 mg/dL (ref 0.3–1.2)
Total Protein: 7.2 g/dL (ref 6.5–8.1)

## 2021-06-04 LAB — URINALYSIS, ROUTINE W REFLEX MICROSCOPIC
Bacteria, UA: NONE SEEN
Bilirubin Urine: NEGATIVE
Glucose, UA: NEGATIVE mg/dL
Ketones, ur: NEGATIVE mg/dL
Leukocytes,Ua: NEGATIVE
Nitrite: NEGATIVE
Protein, ur: NEGATIVE mg/dL
Specific Gravity, Urine: 1.009 (ref 1.005–1.030)
pH: 9 — ABNORMAL HIGH (ref 5.0–8.0)

## 2021-06-04 LAB — TROPONIN I (HIGH SENSITIVITY)
Troponin I (High Sensitivity): 16 ng/L (ref <18)
Troponin I (High Sensitivity): 14 ng/L (ref <18)

## 2021-06-04 LAB — CBC WITH DIFFERENTIAL/PLATELET
Abs Immature Granulocytes: 0.03 10*3/uL (ref 0.00–0.07)
Basophils Absolute: 0 10*3/uL (ref 0.0–0.1)
Basophils Relative: 0 %
Eosinophils Absolute: 0 10*3/uL (ref 0.0–0.5)
Eosinophils Relative: 0 %
HCT: 43.6 % (ref 39.0–52.0)
Hemoglobin: 15 g/dL (ref 13.0–17.0)
Immature Granulocytes: 0 %
Lymphocytes Relative: 10 %
Lymphs Abs: 1 10*3/uL (ref 0.7–4.0)
MCH: 30 pg (ref 26.0–34.0)
MCHC: 34.4 g/dL (ref 30.0–36.0)
MCV: 87.2 fL (ref 80.0–100.0)
Monocytes Absolute: 0.6 10*3/uL (ref 0.1–1.0)
Monocytes Relative: 6 %
Neutro Abs: 8.3 10*3/uL — ABNORMAL HIGH (ref 1.7–7.7)
Neutrophils Relative %: 84 %
Platelets: 190 10*3/uL (ref 150–400)
RBC: 5 MIL/uL (ref 4.22–5.81)
RDW: 13.1 % (ref 11.5–15.5)
WBC: 10 10*3/uL (ref 4.0–10.5)
nRBC: 0 % (ref 0.0–0.2)

## 2021-06-04 LAB — MAGNESIUM: Magnesium: 1.9 mg/dL (ref 1.7–2.4)

## 2021-06-04 MED ORDER — MELATONIN 3 MG PO TABS
3.0000 mg | ORAL_TABLET | Freq: Every day | ORAL | Status: DC
  Administered 2021-06-04 – 2021-06-05 (×2): 3 mg via ORAL
  Filled 2021-06-04 (×2): qty 1

## 2021-06-04 MED ORDER — IRBESARTAN 300 MG PO TABS
300.0000 mg | ORAL_TABLET | Freq: Every day | ORAL | Status: DC
  Administered 2021-06-04 – 2021-06-06 (×3): 300 mg via ORAL
  Filled 2021-06-04 (×3): qty 1

## 2021-06-04 MED ORDER — NAPROXEN SODIUM 275 MG PO TABS
275.0000 mg | ORAL_TABLET | Freq: Two times a day (BID) | ORAL | Status: DC
  Administered 2021-06-04 – 2021-06-06 (×4): 275 mg via ORAL
  Filled 2021-06-04 (×6): qty 1

## 2021-06-04 MED ORDER — ONDANSETRON HCL 4 MG PO TABS: 4.0000 mg | ORAL_TABLET | Freq: Four times a day (QID) | ORAL | Status: DC | PRN

## 2021-06-04 MED ORDER — HYDROMORPHONE HCL 1 MG/ML IJ SOLN
1.0000 mg | Freq: Once | INTRAMUSCULAR | Status: AC
  Administered 2021-06-04: 1 mg via INTRAVENOUS
  Filled 2021-06-04: qty 1

## 2021-06-04 MED ORDER — ACETAMINOPHEN 325 MG PO TABS
650.0000 mg | ORAL_TABLET | Freq: Four times a day (QID) | ORAL | Status: DC | PRN
  Administered 2021-06-05 – 2021-06-06 (×3): 650 mg via ORAL
  Filled 2021-06-04 (×4): qty 2

## 2021-06-04 MED ORDER — ONDANSETRON HCL 4 MG/2ML IJ SOLN
4.0000 mg | Freq: Four times a day (QID) | INTRAMUSCULAR | Status: DC | PRN
  Administered 2021-06-04 – 2021-06-06 (×5): 4 mg via INTRAVENOUS
  Filled 2021-06-04 (×5): qty 2

## 2021-06-04 MED ORDER — AMLODIPINE BESYLATE 5 MG PO TABS
5.0000 mg | ORAL_TABLET | Freq: Every day | ORAL | Status: DC
  Administered 2021-06-04 – 2021-06-06 (×3): 5 mg via ORAL
  Filled 2021-06-04 (×3): qty 1

## 2021-06-04 MED ORDER — CARVEDILOL 3.125 MG PO TABS
25.0000 mg | ORAL_TABLET | Freq: Two times a day (BID) | ORAL | Status: DC
  Administered 2021-06-04 – 2021-06-06 (×4): 25 mg via ORAL
  Filled 2021-06-04 (×4): qty 8

## 2021-06-04 MED ORDER — APIXABAN 5 MG PO TABS
5.0000 mg | ORAL_TABLET | Freq: Two times a day (BID) | ORAL | Status: DC
  Administered 2021-06-04 – 2021-06-06 (×4): 5 mg via ORAL
  Filled 2021-06-04 (×4): qty 1

## 2021-06-04 MED ORDER — TAMSULOSIN HCL 0.4 MG PO CAPS
0.4000 mg | ORAL_CAPSULE | Freq: Two times a day (BID) | ORAL | Status: DC
  Administered 2021-06-04 – 2021-06-06 (×4): 0.4 mg via ORAL
  Filled 2021-06-04 (×4): qty 1

## 2021-06-04 MED ORDER — MIRABEGRON ER 25 MG PO TB24
25.0000 mg | ORAL_TABLET | Freq: Every day | ORAL | Status: DC
  Administered 2021-06-04 – 2021-06-06 (×3): 25 mg via ORAL
  Filled 2021-06-04 (×3): qty 1

## 2021-06-04 MED ORDER — LORAZEPAM 2 MG/ML IJ SOLN: 1.0000 mg | Freq: Once | INTRAMUSCULAR | Status: DC

## 2021-06-04 MED ORDER — ATORVASTATIN CALCIUM 40 MG PO TABS
40.0000 mg | ORAL_TABLET | Freq: Every day | ORAL | Status: DC
  Administered 2021-06-04 – 2021-06-06 (×3): 40 mg via ORAL
  Filled 2021-06-04 (×3): qty 1

## 2021-06-04 MED ORDER — POTASSIUM CHLORIDE CRYS ER 20 MEQ PO TBCR
40.0000 meq | EXTENDED_RELEASE_TABLET | Freq: Once | ORAL | Status: AC
  Administered 2021-06-04: 40 meq via ORAL
  Filled 2021-06-04: qty 2

## 2021-06-04 MED ORDER — ESCITALOPRAM OXALATE 10 MG PO TABS
20.0000 mg | ORAL_TABLET | Freq: Every day | ORAL | Status: DC
  Administered 2021-06-04 – 2021-06-06 (×3): 20 mg via ORAL
  Filled 2021-06-04 (×3): qty 2

## 2021-06-04 MED ORDER — ONDANSETRON HCL 4 MG/2ML IJ SOLN
4.0000 mg | Freq: Once | INTRAMUSCULAR | Status: AC
  Administered 2021-06-04: 4 mg via INTRAVENOUS
  Filled 2021-06-04: qty 2

## 2021-06-04 MED ORDER — DIPHENHYDRAMINE HCL 25 MG PO CAPS
25.0000 mg | ORAL_CAPSULE | Freq: Every evening | ORAL | Status: DC | PRN
  Administered 2021-06-05: 25 mg via ORAL
  Filled 2021-06-04: qty 1

## 2021-06-04 MED ORDER — HYDROMORPHONE HCL 1 MG/ML IJ SOLN
1.0000 mg | Freq: Once | INTRAMUSCULAR | Status: AC
Start: 2021-06-04 — End: 2021-06-04
  Administered 2021-06-04: 1 mg via INTRAVENOUS
  Filled 2021-06-04: qty 1

## 2021-06-04 MED ORDER — OXYCODONE HCL 5 MG PO TABS
5.0000 mg | ORAL_TABLET | Freq: Four times a day (QID) | ORAL | Status: DC | PRN
  Administered 2021-06-04 – 2021-06-06 (×6): 5 mg via ORAL
  Filled 2021-06-04 (×7): qty 1

## 2021-06-04 MED ORDER — POTASSIUM CHLORIDE 10 MEQ/100ML IV SOLN
10.0000 meq | INTRAVENOUS | Status: AC
  Administered 2021-06-04 (×3): 10 meq via INTRAVENOUS
  Filled 2021-06-04 (×3): qty 100

## 2021-06-04 NOTE — Assessment & Plan Note (Signed)
-   Continue escitalopram

## 2021-06-04 NOTE — Hospital Course (Signed)
Mr. Ospina is a 71 y.o. M with history AVNRT status post ablation, as well as paroxysmal atrial fibrillation on Eliquis and BB, OSA not on CPAP, history of left atrial thrombus and pulmonary embolism back in 2014, history hypertension, anxiety/depression, and obesity who presented with left hip pain.  Patient had worsening of left hip pain over the last several days, he thought this might be related to his spinal stenosis, but overnight last night he tried to get up to go to the bathroom, collapsed because of pain and could not get up, had to call EMS.  In the ER, potassium found to be 2.5.  Radiograph of the pelvis showed worsening of left hip arthritis.  Work-up otherwise normal.

## 2021-06-04 NOTE — Assessment & Plan Note (Addendum)
Has chronic hypoK.  Here, is down to 2.6 mmol/L, on admission, now only up to 2.8 after 120 meQ of K. - Supplement Mag - Continue IV K

## 2021-06-04 NOTE — H&P (Signed)
History and Physical    Patient: Brandon [name]. IWP:[id number] DOB: 08-08-1950 DOA: 06/04/2021 DOS: the patient was seen and examined on 06/04/2021 PCP: [name], MD  Patient coming from: Home  Chief Complaint:  Chief Complaint  Patient presents with   Fall   Hip Pain       HPI:  Mr. Pendry is a 71 y.o. M with history AVNRT status post ablation, as well as paroxysmal atrial fibrillation on Eliquis and BB, OSA not on CPAP, history of left atrial thrombus and pulmonary embolism back in 2014, history hypertension, anxiety/depression, and obesity who presented with left hip pain.  Patient had worsening of left hip pain over the last several days, he thought this might be related to his spinal stenosis, but overnight last night he tried to get up to go to the bathroom, collapsed because of pain and could not get up, had to call EMS.  In the ER, potassium found to be 2.5.  Radiograph of the pelvis showed worsening of left hip arthritis.  Work-up otherwise normal.      Review of Systems  Constitutional:  Negative for chills and fever.  Cardiovascular:  Negative for palpitations.  Musculoskeletal:  Positive for joint pain.  Neurological:  Negative for dizziness, focal weakness and loss of consciousness.  All other systems reviewed and are negative.   Past Medical History:  Diagnosis Date   Anxiety    Arthritis    Benign neoplasm of colon    Benign prostatic hyperplasia with urinary obstruction    Bilateral inguinal hernia 05/29/2011   Bilateral pulmonary embolism ([place]) 3/14   admitted to [facility],  treated with Xarelto   Breast lump 03/29/2019   Calculus of kidney 10/21/2016   Cataract    Chronic bilateral low back pain with sciatica 03/21/2012   Chronic pain    Finger mass, right 10/17/2020   History of alcohol abuse 03/07/2016   History of colon polyps 11/18/2015   History of kidney stones    History of pulmonary embolism 03/19/2012   Hyperlipemia    Hypertension     Insomnia    Insomnia due to medical condition 12/23/2016   Polyuria secondary to BPH   Major depressive disorder, recurrent episode ([place])    Mitral regurgitation 04/24/2013   Mild by TEE   Morbid obesity ([place]) 03/07/2016   Nonrheumatic mitral valve insufficiency 04/25/2013   Mild by TEE  Overview:  Overview:  Mild by TEE Overview:  Overview:  Overview:  Mild by TEE Overview:  Overview:  Mild by TEE   Obesity    OSA (obstructive sleep apnea)    noncompliant with CPAP.  07/27/13- awaiting a CPAP- unable to tolerate mask   Osteoporosis    Persistent atrial fibrillation (HCC)    Recurrent unilateral inguinal hernia 07/17/2013   Restless leg syndrome    takes gabapentin   S/P right total knee arthroplasty 05/24/2018   Sciatica    Spinal stenosis, lumbar region with neurogenic claudication    SVT (supraventricular tachycardia) ([place]) 03/07/2016   Thrombus of left atrial appendage 5/0/5397   Uncomplicated asthma 6/73/4193   Overview:  Overview:  Overview:  Overview:  Qualifier: Diagnosis of  By: Sherren Mocha MD, Jory Ee Overview:  Overview:  Qualifier: Diagnosis of  By: Sherren Mocha MD, Jory Ee   Ventral hernia, unspecified, without mention of obstruction or gangrene    right abdominal wall   Past Surgical History:  Procedure Laterality Date   CARDIOVERSION N/A 03/21/2012   Procedure:  CARDIOVERSION;  Surgeon: Birdie Riddle, MD;  Location: [facility] ENDOSCOPY;  Service: Cardiovascular;  Laterality: N/A;   CARDIOVERSION N/A 04/24/2013   Procedure: CARDIOVERSION;  Surgeon: Dorothy Spark, MD;  Location: [facility] ENDOSCOPY;  Service: Cardiovascular;  Laterality: N/A;   CARDIOVERSION N/A 06/27/2015   Procedure: CARDIOVERSION;  Surgeon: Larey Dresser, MD;  Location: [place];  Service: Cardiovascular;  Laterality: N/A;   CATARACT EXTRACTION     COLONOSCOPY W/ POLYPECTOMY     ELECTROPHYSIOLOGIC STUDY N/A 07/30/2015   Procedure: Atrial Fibrillation Ablation;  Surgeon: Thompson Grayer, MD;  Location: [place] CV LAB;   Service: Cardiovascular;  Laterality: N/A;   EXTRACORPOREAL SHOCK WAVE LITHOTRIPSY Right 10/29/2016   Procedure: RIGHT EXTRACORPOREAL SHOCK WAVE LITHOTRIPSY (ESWL);  Surgeon: Alexis Frock, MD;  [facility];  Service: Urology;  Laterality: Right;   EYE SURGERY Right    cataract   FEMUR FRACTURE SURGERY     HERNIA REPAIR  07/06/2011   INGUINAL HERNIA REPAIR Right 07/28/2013   Procedure: RIGHT INGUINAL HERNIA REPAIR;  Surgeon: Imogene Burn. Georgette Dover, MD;  Location: [place];  Service: General;  Laterality: Right;   INGUINAL HERNIA REPAIR Right 09/22/2016   Procedure: LAPAROSCOPIC RIGHT INGUINAL HERNIA;  Surgeon: Kieth Brightly Arta Bruce, MD;  [facility];  Service: General;  Laterality: Right;  With MESH   INSERTION OF MESH Right 07/28/2013   Procedure: INSERTION OF MESH;  Surgeon: Imogene Burn. Georgette Dover, MD;  Location: [place];  Service: General;  Laterality: Right;   KNEE ARTHROSCOPY     left   REPLACEMENT TOTAL KNEE Left    ROTATOR CUFF REPAIR     right   ROTATOR CUFF REPAIR Left 03/08/2014   DR SUPPLE   SHOULDER ARTHROSCOPY WITH ROTATOR CUFF REPAIR AND SUBACROMIAL DECOMPRESSION Left 03/08/2014   Procedure: LEFT SHOULDER ARTHROSCOPY WITH ROTATOR CUFF REPAIR/SUBACROMIAL DECOMPRESSION/DISTAL CLAVICLE RESECTION;  Surgeon: Marin Shutter, MD;  Location: [place];  Service: Orthopedics;  Laterality: Left;   SVT ABLATION N/A 01/23/2019   Procedure: SVT ABLATION;  Surgeon: Evans Lance, MD;  Location: [place] CV LAB;  Service: Cardiovascular;  Laterality: N/A;   TEE WITHOUT CARDIOVERSION N/A 03/21/2012   Procedure: TRANSESOPHAGEAL ECHOCARDIOGRAM (TEE);  Surgeon: Birdie Riddle, MD;  Location: [place];  Service: Cardiovascular;  Laterality: N/A;   TEE WITHOUT CARDIOVERSION N/A 04/24/2013   Procedure: TRANSESOPHAGEAL ECHOCARDIOGRAM (TEE);  Surgeon: Dorothy Spark, MD;  Location: [place];  Service: Cardiovascular;  Laterality: N/A;   TEE WITHOUT CARDIOVERSION N/A 07/29/2015   Procedure:  TRANSESOPHAGEAL ECHOCARDIOGRAM (TEE);  Surgeon: Fay Records, MD;  Location: [place];  Service: Cardiovascular;  Laterality: N/A;   TONSILLECTOMY     TOTAL KNEE ARTHROPLASTY Right 05/24/2018   Procedure: RIGHT TOTAL KNEE ARTHROPLASTY;  Surgeon: Paralee Cancel, MD;  [facility];  Service: Orthopedics;  Laterality: Right;  70 mins   Social History:  reports that he quit smoking about 47 years ago. His smoking use included cigarettes. He has a 2.50 pack-year smoking history. He has never used smokeless tobacco. He reports that he does not drink alcohol and does not use drugs.  Allergies  Allergen Reactions   Ace Inhibitors Cough   Albuterol Other (See Comments)    Racing heart    Family History  Problem Relation Age of Onset   Cancer Father        bone   Heart failure Mother    Hypertension Mother    Asthma Mother    Cancer Paternal Grandmother  ovarian   CVA Other        Fam Hx of multiple myeloma   Diabetes Other        Fam Hx of DM    Prior to Admission medications   Medication Sig Start Date End Date Taking? Authorizing Provider  amLODipine (NORVASC) 5 MG tablet TAKE 1 TABLET BY MOUTH EVERY DAY 06/19/20   Mullis, Kiersten P, DO  atorvastatin (LIPITOR) 40 MG tablet Take 1 tablet (40 mg total) by mouth daily. 08/18/20   Martyn Malay, MD  baclofen (LIORESAL) 10 MG tablet TAKE 1 TABLET BY MOUTH EVERY 8 HOURS AS NEEDED FOR MUSCLE SPASM 10/17/20   Magnus Sinning, MD  capsaicin (ZOSTRIX) 0.025 % cream Apply topically 2 (two) times daily. 02/04/21   [name], MD  carvedilol (COREG) 25 MG tablet Take 1 tablet (25 mg total) by mouth 2 (two) times daily with a meal. 04/22/21   Evans Lance, MD  ELIQUIS 5 MG TABS tablet TAKE 1 TABLET BY MOUTH TWICE A DAY 04/23/21   Evans Lance, MD  escitalopram (LEXAPRO) 20 MG tablet Take 1 tablet (20 mg total) by mouth daily. 05/15/21   [name], MD  fluticasone (FLONASE) 50 MCG/ACT nasal spray Place 2 sprays into both  nostrils daily. 02/03/21   [name], MD  gabapentin (NEURONTIN) 800 MG tablet TAKE 1 TABLET IN THE EVENING AND 1 TABLET AT BEDTIME. 07/16/20   Holley Bouche, MD  irbesartan (AVAPRO) 300 MG tablet Take 1 tablet (300 mg total) by mouth daily. 02/26/20   Evans Lance, MD  KLOR-CON M20 20 MEQ tablet TAKE 1 TABLET BY MOUTH TWICE A DAY 05/05/21   [name], MD  MYRBETRIQ 25 MG TB24 tablet Take 25 mg by mouth daily. 02/27/21   [provider]  tamsulosin (FLOMAX) 0.4 MG CAPS capsule TAKE 1 CAPSULE (0.4 MG TOTAL) BY MOUTH 2 (TWO) TIMES DAILY. 11/19/20   Holley Bouche, MD    Physical Exam: Vitals:   06/04/21 1345 06/04/21 1428 06/04/21 1445 06/04/21 1530  BP: (!) 155/100 (!) 161/97 (!) 167/106 (!) 175/111  Pulse: 85 86 89 86  Resp: 17 15 14 15   Temp:      TempSrc:      SpO2: 91% 91% 93% 96%  Weight:      Height:       Elderly adult male, lying in bed, appears uncomfortable. RRR, no murmurs, no peripheral edema Respiratory rate normal, lungs clear without rales or wheezes Abdomen soft without tenderness palpation or guarding, no ascites or distention Leg strength normal 5/5 in the ankles, knees, and hips, left hip extension without pain.  Range of motion, and external rotation painful on the left hip.  No deformity. Affect frustrated, attention normal, judgment and insight appear normal, speech fluent, face symmetric.      Data Reviewed: Case discussed with emergency medicine. Basic metabolic panel notable for potassium 2.6, otherwise renal function and electrolytes normal. Liver function test normal 8 mg normal. Troponin is negative Urinalysis without red blood cells or bacteria CT head unremarkable Radiograph of the left hip shows worsening arthritis Chest x-ray clear EKG personally reviewed, shows normal sinus rhythm, no ST changes        Assessment and Plan: * Hip pain Patient describes "deep" hip pain, in the left groin, worse with flexion or  weight bearing.  Radiograph shows worsening joint space narrowing on the left. Unable to safely ambulate in the ER due to pain -  PT eval - Start naproxen cautiously (normal renal function but on Eliquis) plan for 3 days only - Acetaminophen or oxycodone PRN for further pain  - Outpatient ortho follow up for ?hip osteoarthritis  acute on chronic hypokalemia Has chronic hypoK.  Here, is down to 2.6 mmol/L - Check Mag - Supplement K   Hyperlipidemia LDL goal <130 - Continue atorvastatin  Morbid obesity (HCC) BMI 36 and hypertension and hyperlipidemia  SVT (supraventricular tachycardia) (HCC) - Correct electroyltes  Depression, major, single episode, complete remission (HCC) - Continue escitalopram  Essential hypertension BP elevated on admission - Continue amlodipine, carvedilol, irbesartan  Paroxysmal atrial fibrillation (HCC) - Continue BB and Eliqius  Obstructive sleep apnea Not on CPAP  History of pulmonary embolism - Continue Eliquis         Advance Care Planning: Full code, confirmed with patient  Consults: None needed  Family Communication: Patient declined  Severity of Illness: The appropriate patient status for this patient is OBSERVATION. Observation status is judged to be reasonable and necessary in order to provide the required intensity of service to ensure the patient's safety. The patient's presenting symptoms, physical exam findings, and initial radiographic and laboratory data in the context of their medical condition is felt to place them at decreased risk for further clinical deterioration. Furthermore, it is anticipated that the patient will be medically stable for discharge from the hospital within 2 midnights of admission.   Author: Edwin Dada, MD 06/04/2021 5:26 PM  For on call review [email or web address].

## 2021-06-04 NOTE — Assessment & Plan Note (Signed)
-   Continue BB and Eliqius

## 2021-06-04 NOTE — ED Notes (Signed)
Pt c/o L hip pain. No obvious deformity. No bruising or swelling noted. Limited active ROM. Pulses present bilaterally. Skin normal in color. Cap refil <2sec

## 2021-06-04 NOTE — Assessment & Plan Note (Signed)
-   Continue Eliquis 

## 2021-06-04 NOTE — Assessment & Plan Note (Signed)
BP elevated on admission - Continue amlodipine, carvedilol, irbesartan

## 2021-06-04 NOTE — Assessment & Plan Note (Signed)
Patient describes "deep" hip pain, in the left groin, worse with flexion or weight bearing.  Radiograph shows worsening joint space narrowing on the left. Unable to safely ambulate in the ER due to pain - PT eval - Start naproxen cautiously (normal renal function but on Eliquis) plan for 3 days only - Acetaminophen or oxycodone PRN for further pain  - Outpatient ortho follow up for ?hip osteoarthritis

## 2021-06-04 NOTE — Assessment & Plan Note (Signed)
-   Correct electroyltes

## 2021-06-04 NOTE — Assessment & Plan Note (Addendum)
BMI 36 and hypertension and hyperlipidemia

## 2021-06-04 NOTE — ED Provider Notes (Signed)
[facility] EMERGENCY DEPARTMENT Provider Note   CSN: [id number] Arrival date & time: 06/04/21  1014     History  Chief Complaint  Patient presents with   Fall   Hip Pain    Brandon [name]. is a 71 y.o. male.  71 year old male with history of spinal stenosis presents with left hip pain. The patient states he has had left sided hip pain for the past few days, and believes it is due to a spinal stenosis flare. The patient states he woke up at 3am to urinate and his left foot slipped and hit the cabinet. He returned to bed and was unable to get up from bed due to hip pain. He denies fall, and states he did not hit his head. He has vomited multiple times this morning due to the pain. His pain is currently 7/10. Patient describes hip pain as sharp and burning. Pain is worse with movement. Patient lives alone. Patient uses walker at home. Pertinent medical history includes Afib treated with Eliquis and spinal stenosis.  The history is provided by the patient.  Fall This is a new problem. Pertinent negatives include no chest pain, no abdominal pain and no shortness of breath. He has tried nothing for the symptoms. The treatment provided no relief.  Hip Pain Pertinent negatives include no chest pain, no abdominal pain and no shortness of breath.      Home Medications Prior to Admission medications   Medication Sig Start Date End Date Taking? Authorizing Provider  amLODipine (NORVASC) 5 MG tablet TAKE 1 TABLET BY MOUTH EVERY DAY 06/19/20   Mullis, Kiersten P, DO  atorvastatin (LIPITOR) 40 MG tablet Take 1 tablet (40 mg total) by mouth daily. 08/18/20   Martyn Malay, MD  baclofen (LIORESAL) 10 MG tablet TAKE 1 TABLET BY MOUTH EVERY 8 HOURS AS NEEDED FOR MUSCLE SPASM 10/17/20   Magnus Sinning, MD  capsaicin (ZOSTRIX) 0.025 % cream Apply topically 2 (two) times daily. 02/04/21   [name], MD  carvedilol (COREG) 25 MG tablet Take 1 tablet (25 mg total) by mouth 2 (two)  times daily with a meal. 04/22/21   Evans Lance, MD  ELIQUIS 5 MG TABS tablet TAKE 1 TABLET BY MOUTH TWICE A DAY 04/23/21   Evans Lance, MD  escitalopram (LEXAPRO) 20 MG tablet Take 1 tablet (20 mg total) by mouth daily. 05/15/21   [name], MD  fluticasone (FLONASE) 50 MCG/ACT nasal spray Place 2 sprays into both nostrils daily. 02/03/21   [name], MD  gabapentin (NEURONTIN) 800 MG tablet TAKE 1 TABLET IN THE EVENING AND 1 TABLET AT BEDTIME. 07/16/20   Holley Bouche, MD  irbesartan (AVAPRO) 300 MG tablet Take 1 tablet (300 mg total) by mouth daily. 02/26/20   Evans Lance, MD  KLOR-CON M20 20 MEQ tablet TAKE 1 TABLET BY MOUTH TWICE A DAY 05/05/21   [name], MD  MYRBETRIQ 25 MG TB24 tablet Take 25 mg by mouth daily. 02/27/21   [provider]  tamsulosin (FLOMAX) 0.4 MG CAPS capsule TAKE 1 CAPSULE (0.4 MG TOTAL) BY MOUTH 2 (TWO) TIMES DAILY. 11/19/20   Holley Bouche, MD      Allergies    Ace inhibitors and Albuterol    Review of Systems   Review of Systems  Respiratory:  Negative for shortness of breath.   Cardiovascular:  Negative for chest pain.  Gastrointestinal:  Negative for abdominal pain.  Neurological:  Negative for weakness.  Psychiatric/Behavioral:  Negative for decreased concentration.   All other systems reviewed and are negative.  Physical Exam Updated Vital Signs BP (!) 155/100   Pulse 85   Temp 98.7 F (37.1 C) (Oral)   Resp 17   Ht '6\' 4"'$  (1.93 m)   Wt 136.1 kg   SpO2 91%   BMI 36.52 kg/m  Physical Exam Vitals and nursing note reviewed.  Constitutional:      General: He is not in acute distress.    Appearance: He is well-developed.  HENT:     Head: Normocephalic and atraumatic.     Mouth/Throat:     Mouth: Mucous membranes are moist.  Eyes:     Conjunctiva/sclera: Conjunctivae normal.  Cardiovascular:     Rate and Rhythm: Normal rate and regular rhythm.     Heart sounds: No murmur heard. Pulmonary:     Effort:  Pulmonary effort is normal. No respiratory distress.     Breath sounds: Normal breath sounds.  Abdominal:     Palpations: Abdomen is soft.     Tenderness: There is no abdominal tenderness.  Musculoskeletal:        General: No swelling.     Cervical back: Normal range of motion and neck supple.     Comments: Pain left hip,  from,    Skin:    General: Skin is warm and dry.     Capillary Refill: Capillary refill takes less than 2 seconds.  Neurological:     Mental Status: He is alert.  Psychiatric:        Mood and Affect: Mood normal.    ED Results / Procedures / Treatments   Labs (all labs ordered are listed, but only abnormal results are displayed) Labs Reviewed  CBC WITH DIFFERENTIAL/PLATELET - Abnormal; Notable for the following components:      Result Value   Neutro Abs 8.3 (*)    All other components within normal limits  COMPREHENSIVE METABOLIC PANEL - Abnormal; Notable for the following components:   Potassium 2.6 (*)    Glucose, Bld 127 (*)    Calcium 8.8 (*)    All other components within normal limits  URINALYSIS, ROUTINE W REFLEX MICROSCOPIC - Abnormal; Notable for the following components:   pH 9.0 (*)    Hgb urine dipstick SMALL (*)    All other components within normal limits  TROPONIN I (HIGH SENSITIVITY)  TROPONIN I (HIGH SENSITIVITY)    EKG None  Radiology DG Chest 1 View  Result Date: 06/04/2021 CLINICAL DATA:  Left hip pain, fall EXAM: CHEST  1 VIEW COMPARISON:  01/21/2019 FINDINGS: Transverse diameter of heart is increased. Thoracic aorta is ectatic. There are no signs of pulmonary edema or new focal infiltrates. There is no pleural effusion or pneumothorax. Deformity in the right clavicle most likely is residual from previous injury with no significant interval change. Degenerative changes are noted in both shoulders. IMPRESSION: Cardiomegaly. There are no signs of pulmonary edema or new focal infiltrates. Electronically Signed   By: Elmer Picker  M.D.   On: 06/04/2021 12:12   CT Head Wo Contrast  Result Date: 06/04/2021 CLINICAL DATA:  Fall, head injury EXAM: CT HEAD WITHOUT CONTRAST TECHNIQUE: Contiguous axial images were obtained from the base of the skull through the vertex without intravenous contrast. RADIATION DOSE REDUCTION: This exam was performed according to the departmental dose-optimization program which includes automated exposure control, adjustment of the mA and/or kV according to patient size  and/or use of iterative reconstruction technique. COMPARISON:  CT head 03/16/2012 FINDINGS: Brain: No evidence of acute infarction, hemorrhage, hydrocephalus, extra-axial collection or mass lesion/mass effect. Vascular: Negative for hyperdense vessel Skull: Negative Sinuses/Orbits: Mucosal edema paranasal sinuses. Bilateral cataract extraction Other: None IMPRESSION: Negative CT head Paranasal sinus mucosal edema. Electronically Signed   By: Franchot Gallo M.D.   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 No definite recent fracture or dislocation is seen. Degenerative changes are noted in both hips with joint space narrowing and bony spurs, more severe in the left hip. IMPRESSION: No recent fracture or dislocation is seen. 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 Degenerative changes are noted in both hips, more severe in the left hip with interval progression. Electronically Signed   By: Elmer Picker M.D.   On: 06/04/2021 12:09    Procedures .Critical Care Performed by: Fransico Meadow, PA-C Authorized by: Fransico Meadow, PA-C   Critical  care provider statement:    Critical care time (minutes):  45   Critical care start time:  06/04/2021 12:00 PM   Critical care end time:  06/04/2021 4:57 PM   Critical care time was exclusive of:  Separately billable procedures and treating other patients and teaching time   Critical care was necessary to treat or prevent imminent or life-threatening deterioration of the following conditions:  CNS failure or compromise and dehydration   Critical care was time spent personally by me on the following activities:  Development of treatment plan with patient or surrogate, interpretation of cardiac output measurements, obtaining history from patient or surrogate, ordering and performing treatments and interventions, ordering and review of laboratory studies, ordering and review of radiographic studies, pulse oximetry, re-evaluation of patient's condition and review of old charts   Care discussed with: admitting provider      Medications Ordered in ED Medications  potassium chloride 10 mEq in 100 mL IVPB (10 mEq Intravenous New Bag/Given 06/04/21 1311)  HYDROmorphone (DILAUDID) injection 1 mg (1 mg Intravenous Given 06/04/21 1131)  ondansetron (ZOFRAN) injection 4 mg (4 mg Intravenous Given 06/04/21 1130)  potassium chloride SA (KLOR-CON M) CR tablet 40 mEq (40 mEq Oral Given 06/04/21 1314)  HYDROmorphone (DILAUDID) injection 1 mg (1 mg Intravenous Given 06/04/21 1410)    ED Course/ Medical Decision Making/ A&P                           Medical Decision Making Patient reports he has a history of spinal stenosis he has chronic pain he is followed by Dr. Jacelyn Grip.  Patient reports he has had a failed nerve ablation.  Amount and/or Complexity of Data Reviewed Independent Historian: EMS    Details: Patient brought in by EMS.  Report patient is unable to tolerate ambulation External Data Reviewed: notes.    Details: Medicine notes from Dr. Ronnald Ramp reviewed.  Patient is on Eliquis for atrial fib.  Orthopedic  notes reviewed pain management notes reviewed Labs: ordered. Decision-making details documented in ED Course.    Details: Labs ordered reviewed and interpreted patient has a  potassium of 2.6 CBC is normal patient has normal white blood cell count he has normal hemoglobin Radiology: ordered and independent interpretation performed. Decision-making details documented in ED Course.    Details: Chest x-ray shows no acute abnormality CT scan of patient's head is obtained CT scan shows no evidence of acute finding Discussion of management or test interpretation with external provider(s): I spoke with Hospitalist who will see for admission   Risk Prescription drug management. Decision regarding hospitalization. Risk Details: Dr. Roderic Palau emergency department physician in to see and evaluate the patient MRI LS spine is ordered           Final Clinical Impression(s) / ED Diagnoses Final diagnoses:  Hypokalemia  Acute low back pain with left-sided sciatica, unspecified back pain laterality    Rx / DC Orders ED Discharge Orders     None         Sidney Ace 06/04/21 1658    Milton Ferguson, MD 06/07/21 1553

## 2021-06-04 NOTE — ED Notes (Signed)
Patient transported to CT 

## 2021-06-04 NOTE — Assessment & Plan Note (Signed)
Continue atorvastatin

## 2021-06-04 NOTE — ED Triage Notes (Addendum)
Pt BIBA for worsening L hip pain and walking pain since 3am. Pt with hx. Of spinal stenosis. Got up and 3am to urinate and lost his footing and guided himself down to the floor. Pt was able to get back to bed but pain was getting worse in back and hip. Pt received 143mg of fentanyl and '4mg'$  Zofran with EMS.

## 2021-06-05 DIAGNOSIS — Z825 Family history of asthma and other chronic lower respiratory diseases: Secondary | ICD-10-CM | POA: Diagnosis not present

## 2021-06-05 DIAGNOSIS — I480 Paroxysmal atrial fibrillation: Secondary | ICD-10-CM | POA: Diagnosis present

## 2021-06-05 DIAGNOSIS — M25552 Pain in left hip: Secondary | ICD-10-CM | POA: Diagnosis not present

## 2021-06-05 DIAGNOSIS — Z96653 Presence of artificial knee joint, bilateral: Secondary | ICD-10-CM | POA: Diagnosis present

## 2021-06-05 DIAGNOSIS — F329 Major depressive disorder, single episode, unspecified: Secondary | ICD-10-CM | POA: Diagnosis present

## 2021-06-05 DIAGNOSIS — Z6836 Body mass index (BMI) 36.0-36.9, adult: Secondary | ICD-10-CM | POA: Diagnosis not present

## 2021-06-05 DIAGNOSIS — E876 Hypokalemia: Secondary | ICD-10-CM | POA: Diagnosis present

## 2021-06-05 DIAGNOSIS — Z86711 Personal history of pulmonary embolism: Secondary | ICD-10-CM | POA: Diagnosis not present

## 2021-06-05 DIAGNOSIS — Z79899 Other long term (current) drug therapy: Secondary | ICD-10-CM | POA: Diagnosis not present

## 2021-06-05 DIAGNOSIS — I10 Essential (primary) hypertension: Secondary | ICD-10-CM | POA: Diagnosis present

## 2021-06-05 DIAGNOSIS — Z87891 Personal history of nicotine dependence: Secondary | ICD-10-CM | POA: Diagnosis not present

## 2021-06-05 DIAGNOSIS — Z8249 Family history of ischemic heart disease and other diseases of the circulatory system: Secondary | ICD-10-CM | POA: Diagnosis not present

## 2021-06-05 DIAGNOSIS — M1612 Unilateral primary osteoarthritis, left hip: Secondary | ICD-10-CM | POA: Diagnosis present

## 2021-06-05 DIAGNOSIS — E785 Hyperlipidemia, unspecified: Secondary | ICD-10-CM | POA: Diagnosis present

## 2021-06-05 DIAGNOSIS — Z7901 Long term (current) use of anticoagulants: Secondary | ICD-10-CM | POA: Diagnosis not present

## 2021-06-05 DIAGNOSIS — F325 Major depressive disorder, single episode, in full remission: Secondary | ICD-10-CM | POA: Diagnosis not present

## 2021-06-05 DIAGNOSIS — G4733 Obstructive sleep apnea (adult) (pediatric): Secondary | ICD-10-CM | POA: Diagnosis present

## 2021-06-05 DIAGNOSIS — M5442 Lumbago with sciatica, left side: Secondary | ICD-10-CM | POA: Diagnosis present

## 2021-06-05 DIAGNOSIS — I471 Supraventricular tachycardia: Secondary | ICD-10-CM | POA: Diagnosis present

## 2021-06-05 LAB — BASIC METABOLIC PANEL
Anion gap: 9 (ref 5–15)
BUN: 8 mg/dL (ref 8–23)
CO2: 30 mmol/L (ref 22–32)
Calcium: 8.6 mg/dL — ABNORMAL LOW (ref 8.9–10.3)
Chloride: 102 mmol/L (ref 98–111)
Creatinine, Ser: 0.57 mg/dL — ABNORMAL LOW (ref 0.61–1.24)
GFR, Estimated: >60 mL/min (ref >60)
Glucose, Bld: 115 mg/dL — ABNORMAL HIGH (ref 70–99)
Potassium: 2.5 mmol/L — CL (ref 3.5–5.1)
Sodium: 141 mmol/L (ref 135–145)

## 2021-06-05 LAB — POTASSIUM: Potassium: 2.8 mmol/L — ABNORMAL LOW (ref 3.5–5.1)

## 2021-06-05 MED ORDER — POTASSIUM CHLORIDE CRYS ER 20 MEQ PO TBCR: 20.0000 meq | EXTENDED_RELEASE_TABLET | Freq: Every day | ORAL | Status: DC

## 2021-06-05 MED ORDER — POTASSIUM CHLORIDE 10 MEQ/100ML IV SOLN
10.0000 meq | INTRAVENOUS | Status: AC
  Administered 2021-06-05 (×6): 10 meq via INTRAVENOUS
  Filled 2021-06-05 (×6): qty 100

## 2021-06-05 MED ORDER — PSYLLIUM 95 % PO PACK
1.0000 | PACK | Freq: Every day | ORAL | Status: DC
  Administered 2021-06-05: 1 via ORAL
  Filled 2021-06-05 (×2): qty 1

## 2021-06-05 MED ORDER — PROCHLORPERAZINE EDISYLATE 10 MG/2ML IJ SOLN
5.0000 mg | Freq: Once | INTRAMUSCULAR | Status: AC
  Administered 2021-06-05: 5 mg via INTRAVENOUS
  Filled 2021-06-05: qty 2

## 2021-06-05 MED ORDER — POTASSIUM CHLORIDE 10 MEQ/100ML IV SOLN
10.0000 meq | INTRAVENOUS | Status: AC
  Administered 2021-06-05 (×2): 10 meq via INTRAVENOUS
  Filled 2021-06-05 (×2): qty 100

## 2021-06-05 MED ORDER — POLYETHYLENE GLYCOL 3350 17 G PO PACK
17.0000 g | PACK | Freq: Two times a day (BID) | ORAL | Status: DC | PRN
  Administered 2021-06-05: 17 g via ORAL
  Filled 2021-06-05: qty 1

## 2021-06-05 MED ORDER — POTASSIUM CHLORIDE 20 MEQ PO PACK
60.0000 meq | PACK | Freq: Once | ORAL | Status: AC
  Administered 2021-06-05: 60 meq via ORAL
  Filled 2021-06-05: qty 3

## 2021-06-05 MED ORDER — MAGNESIUM SULFATE 2 GM/50ML IV SOLN
2.0000 g | Freq: Once | INTRAVENOUS | Status: AC
  Administered 2021-06-05: 2 g via INTRAVENOUS
  Filled 2021-06-05: qty 50

## 2021-06-05 NOTE — Progress Notes (Signed)
The patient verbalized pain and burning at the IV infusion site.  When the writer examined the site, it was hard and swollen.  The student nurse removed the PIV, and the care RN was informed.  The writer initiated a new IV in the left forearm (20 gauge on the first attempt), however it blew before shift change.

## 2021-06-05 NOTE — Progress Notes (Signed)
  Progress Note   Patient: Brandon [name]. NAT:[id number] DOB: [birth year] DOA: 06/04/2021     0 DOS: the patient was seen and examined on 06/05/2021        Brief hospital course: Mr. Utsey is a 71 y.o. M with history AVNRT status post ablation, as well as paroxysmal atrial fibrillation on Eliquis and BB, OSA not on CPAP, history of left atrial thrombus and pulmonary embolism back in 2014, history hypertension, anxiety/depression, and obesity who presented with left hip pain.  Patient had worsening of left hip pain over the last several days, he thought this might be related to his spinal stenosis, but overnight last night he tried to get up to go to the bathroom, collapsed because of pain and could not get up, had to call EMS.  In the ER, potassium found to be 2.5.  Radiograph of the pelvis showed worsening of left hip arthritis.  Work-up otherwise normal.     Assessment and Plan: * Hip pain - Continue oxycodone, naproxen - Outpatient ortho follow up for ?hip osteoarthritis  acute on chronic hypokalemia Has chronic hypoK.  Here, is down to 2.6 mmol/L, on admission, now only up to 2.8 after 120 meQ of K. - Supplement Mag - Continue IV K    Hyperlipidemia LDL goal <130 - Continue atorvastatin  Morbid obesity ([place]) BMI 36 and hypertension and hyperlipidemia    Depression, major, single episode, complete remission ([place]) - Continue escitalopram  Essential hypertension BP elevated on admission - Continue amlodipine, carvedilol, irbesartan  Paroxysmal atrial fibrillation ([facility]) - Continue BB and Eliqius    History of pulmonary embolism - Continue Eliquis          Subjective: Hip pain is somewhat better.  No weakness.  No confusion.     Physical Exam: Vitals:   06/05/21 0003 06/05/21 0418 06/05/21 0750 06/05/21 1542  BP: (!) 143/91 (!) 165/110 (!) 159/96 (!) 151/100  Pulse: 75 79 84 84  Resp: '16 16 17 17  '$ Temp: 97.6 F (36.4 C) 97.9 F (36.6 C) 98.6 F (37 C)  98.2 F (36.8 C)  TempSrc: Oral Oral Oral Oral  SpO2: 91% 90% 92% 92%  Weight:      Height:       Elderly adult male, lying in bed, requiring frequent doses of oxycodone RRR, no murmurs, no peripheral edema, normal respiratory rate and rhythm, lungs clear without rales or wheezes  Data Reviewed: Basic metabolic panel reviewed, potassium still low, repeat potassium still low       Disposition: Status is: Inpatient         Author: Edwin Dada, MD 06/05/2021 6:45 PM  For on call review [email or web address].

## 2021-06-05 NOTE — Evaluation (Signed)
Physical Therapy Evaluation  Patient Details Name: Brandon [name]. MRN: [id number] DOB: Apr 27, 1950 Today's Date: 06/05/2021  History of Present Illness  Pt is a 71 y/o male who presents with L hip pain. Worsening joint space narrowing in the left hip noted on x-ray. PMH significant for AVNRT s/p ablation, as well as paroxysmal a-fib, OSA not on CPAP, history of left atrial thrombus and PE in 2014, HTN, spinal stenosis, anxiety/depression, and obesity.   Clinical Impression  Pt admitted with above diagnosis. Pt currently with functional limitations due to the deficits listed below (see PT Problem List). At the time of PT eval pt was able to perform transfers and ambulation with gross min guard assist and RW for support. Pt limited by pain and nausea reportedly from pain. Pt required frequent rest breaks but was able to negotiate household ambulation distances. Pt expresses concern with returning home today, asking therapist for answers of origin of hip pain and if it will progressively get worse over time. Answered questions within scope of practice. We discussed options for rehab, including HHPT and transitioning to an outpatient therapy practice that offers aquatic physical therapy. Functionally, he is safe to d/c home from a PT standpoint. Acutely, pt will benefit from skilled PT to increase their independence and safety with mobility to allow discharge to the venue listed below.          Recommendations for follow up therapy are one component of a multi-disciplinary discharge planning process, led by the attending physician.  Recommendations may be updated based on patient status, additional functional criteria and insurance authorization.  Follow Up Recommendations Home health PT    Assistance Recommended at Discharge Intermittent Supervision/Assistance  Patient can return home with the following  A little help with walking and/or transfers;A little help with bathing/dressing/bathroom;Assistance  with cooking/housework;Assist for transportation;Help with stairs or ramp for entrance    Equipment Recommendations None recommended by PT  Recommendations for Other Services       Functional Status Assessment Patient has had a recent decline in their functional status and demonstrates the ability to make significant improvements in function in a reasonable and predictable amount of time.     Precautions / Restrictions Precautions Precautions: Fall Restrictions Weight Bearing Restrictions: No      Mobility  Bed Mobility Overal bed mobility: Modified Independent Bed Mobility: Supine to Sit, Sit to Supine           General bed mobility comments: No assist required for transitioning to/from EOB.    Transfers Overall transfer level: Needs assistance Equipment used: Rolling walker (2 wheels) Transfers: Sit to/from Stand Sit to Stand: Modified independent (Device/Increase time)           General transfer comment: Good power up to full stand without unsteadiness or LOB. Light support on RW before initiating gait training.    Ambulation/Gait Ambulation/Gait assistance: Min guard, Supervision Gait Distance (Feet): 60 Feet (x2) Assistive device: Rolling walker (2 wheels) Gait Pattern/deviations: Step-through pattern, Decreased stride length, Trunk flexed Gait velocity: Decreased Gait velocity interpretation: 1.31 - 2.62 ft/sec, indicative of limited community ambulator   General Gait Details: Pt with flexion of trunk during gait, consistent with baseline stenosis in lumbar spine. No overt LOB noted. Pt was able to ambulate to a bench in the hallway for seated rest before ambulating back to the room.  Stairs         General stair comments: Unable to initiate stair training at this time due to decreased tolerance  for functional mobility.  Wheelchair Mobility    Modified Rankin (Stroke Patients Only)       Balance Overall balance assessment: Needs  assistance Sitting-balance support: Feet supported, No upper extremity supported Sitting balance-Leahy Scale: Good     Standing balance support: Bilateral upper extremity supported Standing balance-Leahy Scale: Poor Standing balance comment: Reliant on UE support throughout dynamic activity.                             Pertinent Vitals/Pain Pain Assessment Pain Assessment: 0-10 Pain Score: 6  Pain Location: L hip Pain Descriptors / Indicators: Burning, Grimacing, Guarding Pain Intervention(s): Limited activity within patient's tolerance, Monitored during session, Repositioned    Home Living Family/patient expects to be discharged to:: Private residence Living Arrangements: Alone Available Help at Discharge: Family;Available 24 hours/day Type of Home: House Home Access: Stairs to enter Entrance Stairs-Rails: Right Entrance Stairs-Number of Steps: 13 from the garage vs 3 at the back   Home Layout: One level Home Equipment: Conservation officer, nature (2 wheels);Cane - single point;BSC/3in1;Crutches;Shower seat      Prior Function Prior Level of Function : Needs assist             Mobility Comments: Uses the cane daily, drives, gets groceries delivered ADLs Comments: Uses the shower chair     Hand Dominance        Extremity/Trunk Assessment   Upper Extremity Assessment Upper Extremity Assessment: Overall WFL for tasks assessed    Lower Extremity Assessment Lower Extremity Assessment: LLE deficits/detail LLE Deficits / Details: 5/5 strength with MMT of quads, hamstrings, ankle DF, hip flexors. Sensation in tact with light touch testing. Pt reports no pain with palpation. LLE Sensation: WNL    Cervical / Trunk Assessment Cervical / Trunk Assessment: Other exceptions Cervical / Trunk Exceptions: Grossly flexed trunk with forward head posture.  Communication   Communication: No difficulties  Cognition Arousal/Alertness: Awake/alert Behavior During Therapy: WFL  for tasks assessed/performed Overall Cognitive Status: Within Functional Limits for tasks assessed                                          General Comments      Exercises     Assessment/Plan    PT Assessment Patient needs continued PT services  PT Problem List Decreased strength;Decreased activity tolerance;Decreased balance;Decreased mobility;Decreased knowledge of use of DME;Decreased safety awareness;Decreased knowledge of precautions;Pain       PT Treatment Interventions DME instruction;Functional mobility training;Gait training;Stair training;Therapeutic activities;Therapeutic exercise;Neuromuscular re-education;Patient/family education    PT Goals (Current goals can be found in the Care Plan section)  Acute Rehab PT Goals Patient Stated Goal: Be successful with return home; not have to come back to the hospital once he is discharged PT Goal Formulation: With patient Time For Goal Achievement: 06/12/21 Potential to Achieve Goals: Good    Frequency Min 3X/week     Co-evaluation               AM-PAC PT "6 Clicks" Mobility  Outcome Measure Help needed turning from your back to your side while in a flat bed without using bedrails?: None Help needed moving from lying on your back to sitting on the side of a flat bed without using bedrails?: None Help needed moving to and from a bed to a chair (including a wheelchair)?: A Little Help  needed standing up from a chair using your arms (e.g., wheelchair or bedside chair)?: A Little Help needed to walk in hospital room?: A Little Help needed climbing 3-5 steps with a railing? : A Little 6 Click Score: 20    End of Session Equipment Utilized During Treatment: Gait belt Activity Tolerance: Patient limited by pain Patient left: in bed;with call bell/phone within reach;with nursing/sitter in room Nurse Communication: Mobility status PT Visit Diagnosis: Pain;Difficulty in walking, not elsewhere classified  (R26.2) Pain - Right/Left: Left Pain - part of body: Hip    Time: 1020-1056 PT Time Calculation (min) (ACUTE ONLY): 36 min   Charges:   PT Evaluation $PT Eval Moderate Complexity: 1 Mod PT Treatments $Gait Training: 8-22 mins        Rolinda Roan, PT, DPT [place] Preferred Office: [phone]   Thelma Comp 06/05/2021, 11:23 AM

## 2021-06-05 NOTE — TOC Initial Note (Signed)
Transition of Care (TOC) - Initial/Assessment Note    Patient Details  Name: Brandon [name]. MRN: [id number] Date of Birth: [birth year]  Transition of Care [facility]) CM/SW Contact:    Marilu Favre, RN Phone Number: 06/05/2021, 12:07 PM  Clinical Narrative:                 Spoke to patient at bedside. Confirmed face sheet information. Patient from home with wife.   NCM explained HHPT will do a home assessment and then discuss with him schedule for HHPT visits, but usually 2 to 3 times a week a hour at a time.   NCM received messaged that patient wanted an aide to sit with him while his wife and daughter at at work. NCM explained medicare will cover an aide to come 2 times a week and provide assistance with bathing about a 45 minute visit. For someone to sit with him for long periods of time that would be private pay . Patient voiced understanding and wants HHPT and does NOT want [place] aide.   Referral given to and accepted by Hoyle Sauer with [facility]   Expected Discharge Plan: [place] Barriers to Discharge: No Barriers Identified   Patient Goals and CMS Choice Patient states their goals for this hospitalization and ongoing recovery are:: to return to home   Choice offered to / list presented to : Patient  Expected Discharge Plan and Services Expected Discharge Plan: [place]   Discharge Planning Services: CM Consult Post Acute Care Choice: [place] arrangements for the past 2 months: Single Family Home                   DME Agency: NA       HH Arranged: PT [place] Agency: Other - See comment ([facility])        Prior Living Arrangements/Services Living arrangements for the past 2 months: Single Family Home Lives with:: Spouse Patient language and need for interpreter reviewed:: Yes Do you feel safe going back to the place where you live?: Yes      Need for Family Participation in Patient Care: Yes (Comment) Care  giver support system in place?: Yes (comment)   Criminal Activity/Legal Involvement Pertinent to Current Situation/Hospitalization: No - Comment as needed  Activities of Daily Living Home Assistive Devices/Equipment: Cane (specify quad or straight) ADL Screening (condition at time of admission) Patient's cognitive ability adequate to safely complete daily activities?: Yes Is the patient deaf or have difficulty hearing?: No Does the patient have difficulty seeing, even when wearing glasses/contacts?: No Does the patient have difficulty concentrating, remembering, or making decisions?: No Patient able to express need for assistance with ADLs?: Yes Does the patient have difficulty dressing or bathing?: No Independently performs ADLs?: Yes (appropriate for developmental age) Does the patient have difficulty walking or climbing stairs?: No Weakness of Legs: None Weakness of Arms/Hands: None  Permission Sought/Granted   Permission granted to share information with : No              Emotional Assessment Appearance:: Appears stated age Attitude/Demeanor/Rapport: Engaged Affect (typically observed): Accepting Orientation: : Oriented to Place, Oriented to  Time, Oriented to Situation, Oriented to Self Alcohol / Substance Use: Not Applicable Psych Involvement: No (comment)  Admission diagnosis:  Hypokalemia [E87.6] Hip pain [M25.559] Acute low back pain with left-sided sciatica, unspecified back pain laterality [M54.42] Patient Active Problem List   Diagnosis Date Noted  Hip pain 06/04/2021   acute on chronic hypokalemia 06/04/2021   Prurigo nodularis 02/04/2021   Hyperlipidemia LDL goal <130 02/03/2021   Seasonal allergic rhinitis due to pollen 02/03/2021   Chronic hypokalemia 02/03/2021   Morbid obesity ([place]) 12/24/2020   Spinal stenosis, lumbar region with neurogenic claudication    Olecranon bursitis 01/12/2020   SVT (supraventricular tachycardia) ([place]) 01/21/2019   BPH  (benign prostatic hyperplasia) 10/21/2016   Essential hypertension 10/21/2016   Depression, major, single episode, complete remission ([place]) 10/21/2016   Paroxysmal atrial fibrillation (HCC)    Restless legs syndrome 02/08/2014   Generalized anxiety disorder 12/12/2013   Current use of long term anticoagulation    Obstructive sleep apnea 03/09/2013   History of pulmonary embolism 03/19/2012   PCP:  [name], MD [id number] [place] [place]Phone: [phone]: [phone]    Social Determinants of Health (SDOH) Interventions    Readmission Risk Interventions     View : No data to display.

## 2021-06-06 ENCOUNTER — Other Ambulatory Visit: Payer: Self-pay | Admitting: Student

## 2021-06-06 DIAGNOSIS — G2581 Restless legs syndrome: Secondary | ICD-10-CM

## 2021-06-06 LAB — BASIC METABOLIC PANEL
Anion gap: 11 (ref 5–15)
BUN: 6 mg/dL — ABNORMAL LOW (ref 8–23)
CO2: 29 mmol/L (ref 22–32)
Calcium: 9 mg/dL (ref 8.9–10.3)
Chloride: 101 mmol/L (ref 98–111)
Creatinine, Ser: 0.66 mg/dL (ref 0.61–1.24)
GFR, Estimated: >60 mL/min (ref >60)
Glucose, Bld: 129 mg/dL — ABNORMAL HIGH (ref 70–99)
Potassium: 2.8 mmol/L — ABNORMAL LOW (ref 3.5–5.1)
Sodium: 141 mmol/L (ref 135–145)

## 2021-06-06 LAB — POTASSIUM: Potassium: 3.4 mmol/L — ABNORMAL LOW (ref 3.5–5.1)

## 2021-06-06 MED ORDER — OXYCODONE HCL 5 MG PO TABS: 5.0000 mg | ORAL_TABLET | Freq: Three times a day (TID) | ORAL | 0 refills | Status: DC | PRN

## 2021-06-06 MED ORDER — POTASSIUM CHLORIDE 20 MEQ PO PACK
60.0000 meq | PACK | Freq: Once | ORAL | Status: AC
  Administered 2021-06-06: 60 meq via ORAL
  Filled 2021-06-06: qty 3

## 2021-06-06 MED ORDER — POTASSIUM CHLORIDE 10 MEQ/100ML IV SOLN
10.0000 meq | INTRAVENOUS | Status: AC
  Administered 2021-06-06 (×4): 10 meq via INTRAVENOUS
  Filled 2021-06-06 (×4): qty 100

## 2021-06-06 MED ORDER — POTASSIUM CHLORIDE CRYS ER 10 MEQ PO TBCR: 20.0000 meq | EXTENDED_RELEASE_TABLET | Freq: Every day | ORAL | 0 refills | Status: DC

## 2021-06-06 NOTE — Progress Notes (Signed)
PT Cancellation Note  Patient Details Name: Brandon [name]. MRN: [id number] DOB: [birth year]   Cancelled Treatment:    Reason Eval/Treat Not Completed: Patient declined, no reason specified (Pt sobbing uncontrollably.  Declines PT.  States "no one cares about him" and he "can't take it anymore".  Appears very depressed and wants to speak to MD.  [place] notified.)  Jabaree Mercado A. Amandalynn Pitz, PT, DPT Acute Rehabilitation Services Office: [place] 06/06/2021, 9:41 AM

## 2021-06-06 NOTE — Plan of Care (Signed)

## 2021-06-06 NOTE — Discharge Summary (Signed)
Physician Discharge Summary   Patient: Brandon [name]. MRN: [id number] DOB: [birth year]  Admit date:     06/04/2021  Discharge date: 06/06/21  Discharge Physician: Edwin Dada   PCP: [name], MD     Recommendations at discharge:  Follow up with Dr. Ronnald Ramp in 1 week Dr. Ronnald Ramp: Please obtain BMP to recheck K Dr. Ronnald Ramp: Please assist patient with referral back to Orthopedic Surgeon for evaluation of hip OA, if he has not already arranged for himself     Discharge Diagnoses: Principal Problem:   Hip pain, suspect hip osteoarthritis Active Problems:   History of pulmonary embolism   Obstructive sleep apnea   Paroxysmal atrial fibrillation (HCC)   Essential hypertension   Depression, major, single episode, complete remission (HCC)   SVT (supraventricular tachycardia) ([place])   Morbid obesity ([place])   Hyperlipidemia LDL goal <130   acute on chronic hypokalemia     Hospital Course: Brandon [name] is a 71 y.o. M with history AVNRT status post ablation, as well as paroxysmal atrial fibrillation on Eliquis and BB, OSA not on CPAP, history of left atrial thrombus and pulmonary embolism back in 2014, history hypertension, anxiety/depression, and obesity who presented with left hip pain.  Patient had worsening of left hip pain over the last several days, he thought this might be related to his spinal stenosis, but overnight last night he tried to get up to go to the bathroom, collapsed because of pain and could not get up, had to call EMS.  In the ER, potassium found to be 2.5.  Radiograph of the pelvis showed worsening of left hip arthritis.  Work-up otherwise normal.     * Hip pain Patient describes "deep" hip pain, in the left groin, worse with ROM or weight bearing.  Radiograph shows worsening joint space narrowing on the left.  Although he has been evaluated for lumbar stenosis and told he should not have surgery.  The current symptoms though, (groin pain, ROM pain) in  conection with imaging showing worse hip OA sounds like this may be a separate issue, and may benefit from re-evaluation by a surgeon.  - Outpatient PT and ortho follow up for ?hip osteoarthritis    Acute on chronic hypokalemia Has chronic hypoK.  Here he was down to 2.6 mmol/L on admission.  He was supplemented almost 200 mEq to get to a K of 3.4 mmol/L at discharge.  Discharged on supplementary K, follow up with PCP   Morbid obesity ([place]) BMI 36 and hypertension and hyperlipidemia           The [facility] Controlled Substances Registry was reviewed for this patient prior to discharge.      Disposition: Home Diet recommendation:  Discharge Diet Orders (From admission, onward)     Start     Ordered   06/06/21 0000  Diet - low sodium heart healthy        06/06/21 1345             DISCHARGE MEDICATION: Allergies as of 06/06/2021       Reactions   Ace Inhibitors Cough   Albuterol Other (See Comments)   Racing heart        Medication List     STOP taking these medications    baclofen 10 MG tablet Commonly known as: LIORESAL   capsaicin 0.025 % cream Commonly known as: ZOSTRIX   tamsulosin 0.4 MG Caps capsule Commonly known as: FLOMAX  TAKE these medications    amLODipine 5 MG tablet Commonly known as: NORVASC TAKE 1 TABLET BY MOUTH EVERY DAY What changed: when to take this   atorvastatin 40 MG tablet Commonly known as: LIPITOR Take 1 tablet (40 mg total) by mouth daily.   carvedilol 25 MG tablet Commonly known as: COREG Take 1 tablet (25 mg total) by mouth 2 (two) times daily with a meal.   dutasteride 0.5 MG capsule Commonly known as: AVODART Take 0.5 mg by mouth daily.   Eliquis 5 MG Tabs tablet Generic drug: apixaban TAKE 1 TABLET BY MOUTH TWICE A DAY What changed: how much to take   escitalopram 20 MG tablet Commonly known as: LEXAPRO Take 1 tablet (20 mg total) by mouth daily.   fluticasone 50 MCG/ACT nasal  spray Commonly known as: FLONASE Place 2 sprays into both nostrils daily. What changed:  when to take this reasons to take this   irbesartan 300 MG tablet Commonly known as: AVAPRO Take 1 tablet (300 mg total) by mouth daily.   Myrbetriq 25 MG Tb24 tablet Generic drug: mirabegron ER Take 25 mg by mouth daily.   oxyCODONE 5 MG immediate release tablet Commonly known as: Oxy IR/ROXICODONE Take 1 tablet (5 mg total) by mouth every 8 (eight) hours as needed for severe pain.   potassium chloride 10 MEQ tablet Commonly known as: KLOR-CON M Take 2 tablets (20 mEq total) by mouth at bedtime. What changed:  medication strength how much to take when to take this        [place] Follow up.   Contact information: [address] [address] [phone]         [name], MD. Schedule an appointment as soon as possible for a visit in 1 week(s).   Specialty: Internal Medicine Contact information: [place] [address] [phone]                 Discharge Instructions     Diet - low sodium heart healthy   Complete by: As directed    Discharge instructions   Complete by: As directed    From Dr. Loleta Books: You were admitted for hip pain and low potassium. Here, we gave you potassium and this resolved. Take potassium 20 mEq (two small tabs) nightly  Go see Dr. Ronnald Ramp in 1 week and have him draw labs  Ask Dr. Ronnald Ramp if you should continue this long term   For the hip: Work with PT I recommend you go see an orthopedic surgeon again Dr. Ernestina Patches is at [facility] and they have hip surgeons there.  You may call them to ask about seeing a surgeon, or if you can remember the name of the surgeon you saw in the past, call them.  For pain for now: Here, you were taking naproxen (Aleve) 220 mg twice daily You may take this for the next week, but DISCUSS WITH DR. Ronnald Ramp if you are going  to take longer than a week.  I do not generally recommend it without close monitoring.  You may also take oxycodone 5 mg  You took this 2 or three times per day here, and it might help  Take it with Miralax, as it is an opiate like morphine and will make you constipated and possibly dizzy or nauseated if you take too much.  Also take oxycodone with acetaminophen, as it works better that way.  Increase activity slowly   Complete by: As directed        Discharge Exam: Filed Weights   06/04/21 1024  Weight: 136.1 kg    General: Pt is alert, awake, not in acute distress Cardiovascular: RRR, nl S1-S2, no murmurs appreciated.   No LE edema.   Respiratory: Normal respiratory rate and rhythm.  CTAB without rales or wheezes. Abdominal: Abdomen soft and non-tender.  No distension or HSM.   MSK: Pain worse with ROM of the left hip.  Negative SLR.   Neuro/Psych: Strength symmetric in upper and lower extremities.  Judgment and insight appear normal.   Condition at discharge: good  The results of significant diagnostics from this hospitalization (including imaging, microbiology, ancillary and laboratory) are listed below for reference.   Imaging Studies: DG Chest 1 View  Result Date: 06/04/2021 CLINICAL DATA:  Left hip pain, fall EXAM: CHEST  1 VIEW COMPARISON:  01/21/2019 FINDINGS: Transverse diameter of heart is increased. Thoracic aorta is ectatic. There are no signs of pulmonary edema or new focal infiltrates. There is no pleural effusion or pneumothorax. Deformity in the right clavicle most likely is residual from previous injury with no significant interval change. Degenerative changes are noted in both shoulders. IMPRESSION: Cardiomegaly. There are no signs of pulmonary edema or new focal infiltrates. Electronically Signed   By: Elmer Picker M.D.   On: 06/04/2021 12:12   CT Head Wo Contrast  Result Date: 06/04/2021 CLINICAL DATA:  Fall, head injury EXAM: CT HEAD WITHOUT  CONTRAST TECHNIQUE: Contiguous axial images were obtained from the base of the skull through the vertex without intravenous contrast. RADIATION DOSE REDUCTION: This exam was performed according to the departmental dose-optimization program which includes automated exposure control, adjustment of the mA and/or kV according to patient size and/or use of iterative reconstruction technique. COMPARISON:  CT head 03/16/2012 FINDINGS: Brain: No evidence of acute infarction, hemorrhage, hydrocephalus, extra-axial collection or mass lesion/mass effect. Vascular: Negative for hyperdense vessel Skull: Negative Sinuses/Orbits: Mucosal edema paranasal sinuses. Bilateral cataract extraction Other: None IMPRESSION: Negative CT head Paranasal sinus mucosal edema. Electronically Signed   By: Franchot Gallo M.D.   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 No definite recent fracture or dislocation is seen. Degenerative changes are noted in both hips with joint space narrowing and bony spurs, more severe in the left hip. IMPRESSION: No recent fracture or dislocation is seen. 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 Degenerative changes are noted in both hips, more severe in the left hip with interval progression. Electronically Signed   By: Elmer Picker M.D.   On: 06/04/2021 12:09    Microbiology: Results for orders placed or performed in visit on 12/24/20  Gram Stain/Body Fluid Culture     Status: None   Collection Time: 12/24/20 12:14 PM   Specimen: Body Fluid   EB  Result Value Ref Range Status   Gram Stain Result Final  report  Final   Organism ID, Bacteria Comment  Final    Comment: Rare white blood cells.   Organism ID, Bacteria No organisms seen  Final  Body Fluid Culture, Sterile Final report  Final   Organism ID, Bacteria Comment  Final    Comment: No growth in 56 - 72 hours.    Labs: CBC: Recent Labs  Lab 06/04/21 1136  WBC 10.0  NEUTROABS 8.3*  HGB 15.0  HCT 43.6  MCV 87.2  PLT 601   Basic Metabolic Panel: Recent Labs  Lab 06/04/21 1136 06/04/21 1901 06/05/21 0210 06/05/21 1303 06/06/21 0623 06/06/21 1155  NA 142  --  141  --  141  --   K 2.6*  --  2.5* 2.8* 2.8* 3.4*  CL 102  --  102  --  101  --   CO2 30  --  30  --  29  --   GLUCOSE 127*  --  115*  --  129*  --   BUN 9  --  8  --  6*  --   CREATININE 0.61  --  0.57*  --  0.66  --   CALCIUM 8.8*  --  8.6*  --  9.0  --   MG  --  1.9  --   --   --   --    Liver Function Tests: Recent Labs  Lab 06/04/21 1136  AST 16  ALT 13  ALKPHOS 97  BILITOT 0.9  PROT 7.2  ALBUMIN 4.0   CBG: No results for input(s): GLUCAP in the last 168 hours.  Discharge time spent: approximately 25 minutes spent on discharge counseling, evaluation of patient on day of discharge, and coordination of discharge planning with nursing, social work, pharmacy and case management  Signed: Edwin Dada, MD [facility] 06/06/2021

## 2021-06-06 NOTE — TOC Progression Note (Signed)
Transition of Care (TOC) - Progression Note    Patient Details  Name: Brandon [name]. MRN: [id number] Date of Birth: 30-Nov-1950  Transition of Care [facility]) CM/SW Contact  Selina Tapper, Edson Snowball, RN Phone Number: 06/06/2021, 1:33 PM  Clinical Narrative:     Patient for discharge today . Left Hoyle Sauer with [place] a message   Expected Discharge Plan: [place] Barriers to Discharge: No Barriers Identified  Expected Discharge Plan and Services Expected Discharge Plan: [place]   Discharge Planning Services: CM Consult Post Acute Care Choice: [place] arrangements for the past 2 months: Single Family Home                   DME Agency: NA       HH Arranged: PT [place] Agency: Other - See comment ([facility])         Social Determinants of Health (SDOH) Interventions    Readmission Risk Interventions     View : No data to display.

## 2021-06-08 ENCOUNTER — Encounter (INDEPENDENT_AMBULATORY_CARE_PROVIDER_SITE_OTHER): Payer: Medicare Other | Admitting: Internal Medicine

## 2021-06-08 DIAGNOSIS — M48062 Spinal stenosis, lumbar region with neurogenic claudication: Secondary | ICD-10-CM

## 2021-06-08 DIAGNOSIS — Z79891 Long term (current) use of opiate analgesic: Secondary | ICD-10-CM | POA: Diagnosis not present

## 2021-06-09 ENCOUNTER — Telehealth: Payer: Self-pay

## 2021-06-09 ENCOUNTER — Other Ambulatory Visit: Payer: Self-pay | Admitting: Internal Medicine

## 2021-06-09 DIAGNOSIS — M160 Bilateral primary osteoarthritis of hip: Secondary | ICD-10-CM

## 2021-06-09 DIAGNOSIS — M48061 Spinal stenosis, lumbar region without neurogenic claudication: Secondary | ICD-10-CM

## 2021-06-09 DIAGNOSIS — Z515 Encounter for palliative care: Secondary | ICD-10-CM

## 2021-06-09 MED ORDER — OXYCODONE HCL 5 MG PO TABS: 5.0000 mg | ORAL_TABLET | Freq: Three times a day (TID) | ORAL | 0 refills | Status: DC | PRN

## 2021-06-09 NOTE — Telephone Encounter (Signed)
Transition Care Management Unsuccessful Follow-up Telephone Call  Date of discharge and from where:  [place] 06-06-21 Dx:  Hypokalemia/ hip pain  Attempts:  1st Attempt  Reason for unsuccessful TCM follow-up call:  Unable to reach patient  Transition Care Management Follow-up Telephone Call Date of discharge and from where: [place] 06-06-21 Dx:  Hypokalemia/ hip pain How have you been since you were released from the hospital? Feeling terrible  Any questions or concerns? Yes- constipation issues-   Items Reviewed: Did the pt receive and understand the discharge instructions provided? Yes  Medications obtained and verified? Yes  Other? No  Any new allergies since your discharge? No  Dietary orders reviewed? Yes Do you have support at home? Yes   Home Care and Equipment/Supplies: Were home health services ordered? Yes- PT If so, what is the name of the agency? [facility]   Has the agency set up a time to come to the patient's home? yes Were any new equipment or medical supplies ordered?  No What is the name of the medical supply agency? na Were you able to get the supplies/equipment? not applicable Do you have any questions related to the use of the equipment or supplies? No  Functional Questionnaire: (I = Independent and D = Dependent) ADLs: I  Bathing/Dressing- I  Meal Prep- I  Eating- I  Maintaining continence- I  Transferring/Ambulation- I  Managing Meds- I  Follow up appointments reviewed:  PCP Hospital f/u appt confirmed? Yes  Scheduled to see Dr Sharlet Salina on 06-20-21 @ [facility] Hospital f/u appt confirmed? No  . Are transportation arrangements needed? No  If their condition worsens, is the pt aware to call PCP or go to the Emergency Dept.? yes Was the patient provided with contact information for the PCP's office or ED? Yes Was to pt encouraged to call back with questions or concerns? Yes

## 2021-06-10 ENCOUNTER — Other Ambulatory Visit: Payer: Self-pay | Admitting: Internal Medicine

## 2021-06-10 ENCOUNTER — Telehealth: Payer: Self-pay | Admitting: Internal Medicine

## 2021-06-10 ENCOUNTER — Telehealth: Payer: Self-pay

## 2021-06-10 DIAGNOSIS — M160 Bilateral primary osteoarthritis of hip: Secondary | ICD-10-CM

## 2021-06-10 DIAGNOSIS — Z515 Encounter for palliative care: Secondary | ICD-10-CM

## 2021-06-10 DIAGNOSIS — Z79891 Long term (current) use of opiate analgesic: Secondary | ICD-10-CM | POA: Insufficient documentation

## 2021-06-10 DIAGNOSIS — M48061 Spinal stenosis, lumbar region without neurogenic claudication: Secondary | ICD-10-CM

## 2021-06-10 DIAGNOSIS — K5904 Chronic idiopathic constipation: Secondary | ICD-10-CM

## 2021-06-10 MED ORDER — NALOXONE HCL 4 MG/0.1ML NA LIQD: 1.0000 | Freq: Once | NASAL | 2 refills | Status: AC

## 2021-06-10 MED ORDER — XTAMPZA ER 9 MG PO C12A: 1.0000 | EXTENDED_RELEASE_CAPSULE | Freq: Two times a day (BID) | ORAL | 0 refills | Status: DC | PRN

## 2021-06-10 MED ORDER — LUBIPROSTONE 24 MCG PO CAPS: 24.0000 ug | ORAL_CAPSULE | Freq: Two times a day (BID) | ORAL | 0 refills | Status: DC

## 2021-06-10 NOTE — Telephone Encounter (Signed)
[facility] - [facility]  Please advise.

## 2021-06-10 NOTE — Telephone Encounter (Signed)
Key: [id number]  Approved from 05/11/2021 through 06/10/2022.

## 2021-06-10 NOTE — Telephone Encounter (Signed)
Please see the MyChart message reply(ies) for my assessment and plan.  The patient gave consent for this Medical Advice Message and is aware that it may result in a bill to their insurance company as well as the possibility that this may result in a co-payment or deductible. They are an established patient, but are not seeking medical advice exclusively about a problem treated during an in person or video visit in the last 7 days. I did not recommend an in person or video visit within 7 days of my reply.  I spent a total of 12 minutes cumulative time within 7 days through MyChart messaging Keylani Perlstein, MD  

## 2021-06-11 ENCOUNTER — Telehealth: Payer: Self-pay

## 2021-06-11 ENCOUNTER — Encounter: Payer: Self-pay | Admitting: Internal Medicine

## 2021-06-11 ENCOUNTER — Telehealth: Payer: Medicare Other | Admitting: Physician Assistant

## 2021-06-11 ENCOUNTER — Other Ambulatory Visit: Payer: Self-pay | Admitting: Internal Medicine

## 2021-06-11 DIAGNOSIS — M48061 Spinal stenosis, lumbar region without neurogenic claudication: Secondary | ICD-10-CM

## 2021-06-11 DIAGNOSIS — M1610 Unilateral primary osteoarthritis, unspecified hip: Secondary | ICD-10-CM

## 2021-06-11 DIAGNOSIS — Z515 Encounter for palliative care: Secondary | ICD-10-CM

## 2021-06-11 DIAGNOSIS — M160 Bilateral primary osteoarthritis of hip: Secondary | ICD-10-CM

## 2021-06-11 MED ORDER — OXYCODONE-ACETAMINOPHEN 10-325 MG PO TABS: 1.0000 | ORAL_TABLET | Freq: Three times a day (TID) | ORAL | 0 refills | Status: AC | PRN

## 2021-06-11 NOTE — Telephone Encounter (Signed)
Key: [place]

## 2021-06-11 NOTE — Telephone Encounter (Signed)
Per CoverMyMeds: ? ?PA was denied.  ?

## 2021-06-11 NOTE — Telephone Encounter (Signed)
Patient called again today concerning his pain medications - he needs them today.

## 2021-06-11 NOTE — Progress Notes (Addendum)
Virtual Visit Consent   Fahd B Marcy Panning., you are scheduled for a virtual visit with a [facility] provider today. Just as with appointments in the office, your consent must be obtained to participate. Your consent will be active for this visit and any virtual visit you may have with one of our providers in the next 365 days. If you have a MyChart account, a copy of this consent can be sent to you electronically.  As this is a virtual visit, video technology does not allow for your provider to perform a traditional examination. This may limit your provider's ability to fully assess your condition. If your provider identifies any concerns that need to be evaluated in person or the need to arrange testing (such as labs, EKG, etc.), we will make arrangements to do so. Although advances in technology are sophisticated, we cannot ensure that it will always work on either your end or our end. If the connection with a video visit is poor, the visit may have to be switched to a telephone visit. With either a video or telephone visit, we are not always able to ensure that we have a secure connection.  By engaging in this virtual visit, you consent to the provision of healthcare and authorize for your insurance to be billed (if applicable) for the services provided during this visit. Depending on your insurance coverage, you may receive a charge related to this service.  I need to obtain your verbal consent now. Are you willing to proceed with your visit today? Brandon B [place] [name] has provided verbal consent on 06/11/2021 for a virtual visit (video or telephone). Leeanne Rio, [place]  Date: 06/11/2021 10:42 AM  Virtual Visit via Video Note   I, Leeanne Rio, connected with  Brandon [name].  ([id number], March 31, 1950) on 06/11/21 at 10:00 AM EDT by a video-enabled telemedicine application and verified that I am speaking with the correct person using two identifiers.  Location: Patient: Virtual Visit Location  Patient: Home Provider: Virtual Visit Location Provider: Home Office   I discussed the limitations of evaluation and management by telemedicine and the availability of in person appointments. The patient expressed understanding and agreed to proceed.    History of Present Illness: Brandon [name]. is a 71 y.o. who identifies as a male who was assigned male at birth, and is being seen today with questions about prescription sent in for him for chronic and worsening OA of hip and OIC -- Amitiza and Xtampa. Has not heard about status of these and is in severe pain and unsure what to do.   HPI: HPI  Problems:  Patient Active Problem List   Diagnosis Date Noted   Chronic idiopathic constipation 06/10/2021   Long-term current use of opiate analgesic 06/10/2021   Prurigo nodularis 02/04/2021   Hyperlipidemia LDL goal <130 02/03/2021   Seasonal allergic rhinitis due to pollen 02/03/2021   Chronic hypokalemia 02/03/2021   Morbid obesity ([place]) 12/24/2020   Spinal stenosis, lumbar region with neurogenic claudication    Olecranon bursitis 01/12/2020   SVT (supraventricular tachycardia) ([place]) 01/21/2019   BPH (benign prostatic hyperplasia) 10/21/2016   Essential hypertension 10/21/2016   Depression, major, single episode, complete remission ([place]) 10/21/2016   Paroxysmal atrial fibrillation (HCC)    Restless legs syndrome 02/08/2014   Generalized anxiety disorder 12/12/2013   Current use of long term anticoagulation    Obstructive sleep apnea 03/09/2013   History of pulmonary embolism 03/19/2012    Allergies:  Allergies  Allergen Reactions   Ace Inhibitors Cough   Albuterol Other (See Comments)    Racing heart   Medications:  Current Outpatient Medications:    amLODipine (NORVASC) 5 MG tablet, TAKE 1 TABLET BY MOUTH EVERY DAY (Patient taking differently: Take 5 mg by mouth every evening.), Disp: 90 tablet, Rfl: 3   atorvastatin (LIPITOR) 40 MG tablet, Take 1 tablet (40 mg total) by mouth  daily., Disp: 90 tablet, Rfl: 3   carvedilol (COREG) 25 MG tablet, Take 1 tablet (25 mg total) by mouth 2 (two) times daily with a meal., Disp: 180 tablet, Rfl: 3   dutasteride (AVODART) 0.5 MG capsule, Take 0.5 mg by mouth daily., Disp: , Rfl:    ELIQUIS 5 MG TABS tablet, TAKE 1 TABLET BY MOUTH TWICE A DAY (Patient taking differently: Take 5 mg by mouth 2 (two) times daily.), Disp: 60 tablet, Rfl: 5   escitalopram (LEXAPRO) 20 MG tablet, Take 1 tablet (20 mg total) by mouth daily., Disp: 90 tablet, Rfl: 3   fluticasone (FLONASE) 50 MCG/ACT nasal spray, Place 2 sprays into both nostrils daily. (Patient taking differently: Place 2 sprays into both nostrils daily as needed for allergies.), Disp: 48 g, Rfl: 1   gabapentin (NEURONTIN) 800 MG tablet, TAKE 1 TABLET IN THE EVENING AND 1 TABLET AT BEDTIME., Disp: 180 tablet, Rfl: 3   irbesartan (AVAPRO) 300 MG tablet, Take 1 tablet (300 mg total) by mouth daily. (Patient not taking: Reported on 06/04/2021), Disp: 90 tablet, Rfl: 1   lubiprostone (AMITIZA) 24 MCG capsule, Take 1 capsule (24 mcg total) by mouth 2 (two) times daily with a meal., Disp: 180 capsule, Rfl: 0   MYRBETRIQ 25 MG TB24 tablet, Take 25 mg by mouth daily., Disp: , Rfl:    oxyCODONE ER (XTAMPZA ER) 9 MG C12A, Take 1 tablet by mouth 2 (two) times daily as needed., Disp: 60 capsule, Rfl: 0   potassium chloride SA (KLOR-CON M) 10 MEQ tablet, Take 2 tablets (20 mEq total) by mouth at bedtime., Disp: 60 tablet, Rfl: 0  Observations/Objective: Patient is well-developed, well-nourished in no acute distress.  Resting comfortably at home.  Head is normocephalic, atraumatic.  No labored breathing. Speech is clear and coherent with logical content.  Patient is alert and oriented at baseline.   Assessment and Plan: 1. Primary osteoarthritis of hip, unspecified laterality  Reviewed chart. Seems that Oxy IR was sent in on 6/5 but on national backorder so provider sent in Rx for Xtampa 9 mg  instead along with Amitiza for constipation, both of which required a prior authorization. PA for Amitiza approved as of yesterday per chart -- confirmed with pharmacist -- and medication is being filled now for him to pick up. The Marilynne Halsted is still pending per last update in EMR. Pharmacist tried to run it through but showing PA still needed as well. Was able to speek with PCP's CMA who is going to speak with Dr. Ronnald Ramp and see what they can do to expedite a response so they can get Mr Broxson taken care of. He has been instructed to be seen in person at [facility] or ER if anything worsens prior to insurance giving approval for pain medication for him.   Follow Up Instructions: I discussed the assessment and treatment plan with the patient. The patient was provided an opportunity to ask questions and all were answered. The patient agreed with the plan and demonstrated an understanding of the instructions.  A copy of instructions were  sent to the patient via MyChart unless otherwise noted below.    The patient was advised to call back or seek an in-person evaluation if the symptoms worsen or if the condition fails to improve as anticipated.  Time:  I spent 10 minutes with the patient via telehealth technology discussing the above problems/concerns.    Leeanne Rio, PA-C

## 2021-06-15 DIAGNOSIS — I471 Supraventricular tachycardia: Secondary | ICD-10-CM | POA: Diagnosis not present

## 2021-06-15 DIAGNOSIS — F419 Anxiety disorder, unspecified: Secondary | ICD-10-CM | POA: Diagnosis not present

## 2021-06-15 DIAGNOSIS — E785 Hyperlipidemia, unspecified: Secondary | ICD-10-CM | POA: Diagnosis not present

## 2021-06-15 DIAGNOSIS — N401 Enlarged prostate with lower urinary tract symptoms: Secondary | ICD-10-CM | POA: Diagnosis not present

## 2021-06-15 DIAGNOSIS — Z7951 Long term (current) use of inhaled steroids: Secondary | ICD-10-CM | POA: Diagnosis not present

## 2021-06-15 DIAGNOSIS — Z7901 Long term (current) use of anticoagulants: Secondary | ICD-10-CM | POA: Diagnosis not present

## 2021-06-15 DIAGNOSIS — Z86010 Personal history of colonic polyps: Secondary | ICD-10-CM | POA: Diagnosis not present

## 2021-06-15 DIAGNOSIS — G4733 Obstructive sleep apnea (adult) (pediatric): Secondary | ICD-10-CM | POA: Diagnosis not present

## 2021-06-15 DIAGNOSIS — M1612 Unilateral primary osteoarthritis, left hip: Secondary | ICD-10-CM | POA: Diagnosis not present

## 2021-06-15 DIAGNOSIS — G8929 Other chronic pain: Secondary | ICD-10-CM | POA: Diagnosis not present

## 2021-06-15 DIAGNOSIS — I10 Essential (primary) hypertension: Secondary | ICD-10-CM | POA: Diagnosis not present

## 2021-06-15 DIAGNOSIS — Z87891 Personal history of nicotine dependence: Secondary | ICD-10-CM | POA: Diagnosis not present

## 2021-06-15 DIAGNOSIS — M48061 Spinal stenosis, lumbar region without neurogenic claudication: Secondary | ICD-10-CM | POA: Diagnosis not present

## 2021-06-15 DIAGNOSIS — E876 Hypokalemia: Secondary | ICD-10-CM | POA: Diagnosis not present

## 2021-06-15 DIAGNOSIS — F325 Major depressive disorder, single episode, in full remission: Secondary | ICD-10-CM | POA: Diagnosis not present

## 2021-06-15 DIAGNOSIS — I4819 Other persistent atrial fibrillation: Secondary | ICD-10-CM | POA: Diagnosis not present

## 2021-06-15 DIAGNOSIS — M1990 Unspecified osteoarthritis, unspecified site: Secondary | ICD-10-CM | POA: Diagnosis not present

## 2021-06-15 DIAGNOSIS — Z86711 Personal history of pulmonary embolism: Secondary | ICD-10-CM | POA: Diagnosis not present

## 2021-06-15 DIAGNOSIS — M810 Age-related osteoporosis without current pathological fracture: Secondary | ICD-10-CM | POA: Diagnosis not present

## 2021-06-20 ENCOUNTER — Ambulatory Visit (INDEPENDENT_AMBULATORY_CARE_PROVIDER_SITE_OTHER): Payer: Medicare Other | Admitting: Internal Medicine

## 2021-06-20 ENCOUNTER — Encounter: Payer: Self-pay | Admitting: Internal Medicine

## 2021-06-20 ENCOUNTER — Other Ambulatory Visit: Payer: Self-pay | Admitting: Internal Medicine

## 2021-06-20 VITALS — BP 118/84 | HR 65 | Temp 98.5°F | Wt 281.0 lb

## 2021-06-20 DIAGNOSIS — K5904 Chronic idiopathic constipation: Secondary | ICD-10-CM

## 2021-06-20 DIAGNOSIS — M1612 Unilateral primary osteoarthritis, left hip: Secondary | ICD-10-CM | POA: Diagnosis not present

## 2021-06-20 DIAGNOSIS — E876 Hypokalemia: Secondary | ICD-10-CM | POA: Diagnosis not present

## 2021-06-20 LAB — COMPREHENSIVE METABOLIC PANEL
ALT: 12 U/L (ref 0–53)
AST: 18 U/L (ref 0–37)
Albumin: 4 g/dL (ref 3.5–5.2)
Alkaline Phosphatase: 79 U/L (ref 39–117)
BUN: 10 mg/dL (ref 6–23)
CO2: 35 mEq/L — ABNORMAL HIGH (ref 19–32)
Calcium: 9.1 mg/dL (ref 8.4–10.5)
Chloride: 98 mEq/L (ref 96–112)
Creatinine, Ser: 0.78 mg/dL (ref 0.40–1.50)
GFR: 88.78 mL/min (ref >60.00)
Glucose, Bld: 99 mg/dL (ref 70–99)
Potassium: 3 mEq/L — ABNORMAL LOW (ref 3.5–5.1)
Sodium: 141 mEq/L (ref 135–145)
Total Bilirubin: 0.8 mg/dL (ref 0.2–1.2)
Total Protein: 6.7 g/dL (ref 6.0–8.3)

## 2021-06-20 LAB — MAGNESIUM: Magnesium: 1.7 mg/dL (ref 1.5–2.5)

## 2021-06-20 MED ORDER — PREDNISONE 20 MG PO TABS
40.0000 mg | ORAL_TABLET | Freq: Every day | ORAL | 0 refills | Status: AC
Start: 2021-06-20 — End: 2021-06-27

## 2021-06-20 MED ORDER — MAGNESIUM 30 MG PO TABS: 30.0000 mg | ORAL_TABLET | Freq: Two times a day (BID) | ORAL | 0 refills | Status: DC

## 2021-06-20 NOTE — Progress Notes (Signed)
   Subjective:   Patient ID: Brandon [name]., male    DOB: 01/15/1950, 71 y.o.   MRN: [id number]  HPI The patient is a 71 YO man coming in for hospital follow up (admitted 06/04/21 to 06/06/21 for hypokalemia and severe left hip pain, given 200 mEq potassium and pain management for the hip pain, x-ray confirms worsening severe arthritis and old fracture). He states the old fracture was in the 50s from an accident. He is having severe left hip pain with standing and walking. Pain in his low back as well and has had injections which helped some. Is looking for a second opinion as well as a doctor for the hip. Has been using oxycodone which was prescribed in the hospital and refilled by his PCP. He is taking xtampza er 9 mg prescribed BID but taking TID currently for pain relief. This is causing constipation which he is taking medication for. Denies vomiting. Some mild nausea. No problems passing gas.   PMH, [facility], social history reviewed and updated  Review of Systems  Constitutional:  Positive for activity change and appetite change.  HENT: Negative.    Eyes: Negative.   Respiratory:  Negative for cough, chest tightness and shortness of breath.   Cardiovascular:  Negative for chest pain, palpitations and leg swelling.  Gastrointestinal:  Positive for constipation and nausea. Negative for abdominal distention, abdominal pain, diarrhea and vomiting.  Musculoskeletal:  Positive for arthralgias, back pain, gait problem and myalgias.  Skin: Negative.   Psychiatric/Behavioral: Negative.      Objective:  Physical Exam Constitutional:      Appearance: He is well-developed.  HENT:     Head: Normocephalic and atraumatic.  Cardiovascular:     Rate and Rhythm: Normal rate and regular rhythm.  Pulmonary:     Effort: Pulmonary effort is normal. No respiratory distress.     Breath sounds: Normal breath sounds. No wheezing or rales.  Abdominal:     General: Bowel sounds are normal. There is no distension.      Palpations: Abdomen is soft.     Tenderness: There is no abdominal tenderness. There is no rebound.  Musculoskeletal:        General: Tenderness present.     Cervical back: Normal range of motion.  Skin:    General: Skin is warm and dry.  Neurological:     Mental Status: He is alert and oriented to person, place, and time.     Coordination: Coordination normal.     Vitals:   06/20/21 0903  BP: 118/84  Pulse: 65  Temp: 98.5 F (36.9 C)  TempSrc: Oral  SpO2: 90%  Weight: 281 lb (127.5 kg)    Assessment & Plan:

## 2021-06-20 NOTE — Assessment & Plan Note (Signed)
Checking BMP and magnesium levels today. He does have chronically low K and had not been taking supplements. He is taking 20 mEq potassium since discharge. Adjust as needed. He is not on any potassium wasting medications.

## 2021-06-20 NOTE — Assessment & Plan Note (Signed)
This is worse with opioid therapy. Counseled about otc products to add to amitiza and if no gas passing to call office. If unable to have BM call for advice.

## 2021-06-20 NOTE — Patient Instructions (Signed)
We will do prednisone 40 mg daily for 7 days.  We will get you in urgently with the hip specialist.   We can refill the oxycodone when needed.

## 2021-06-20 NOTE — Assessment & Plan Note (Signed)
He is having severe pain. Refer to orthopedic surgery for opinion. Rx prednisone to see if this can help some in the meantime. I have told him I will refill xtampza oxycodone ER 9 mg TID for up to 1 month while he gets it with orthopedics. After that he will need to return to PCP for care. It is unclear if this pain is coming from hip or low back. There are changes and severe arthritis on x-ray and pain on standing and walking. Given past fracture it is unclear if this will be a good surgical option.

## 2021-06-22 ENCOUNTER — Other Ambulatory Visit: Payer: Self-pay

## 2021-06-22 ENCOUNTER — Encounter (HOSPITAL_COMMUNITY): Payer: Self-pay

## 2021-06-22 ENCOUNTER — Emergency Department (HOSPITAL_COMMUNITY)
Admission: EM | Admit: 2021-06-22 | Discharge: 2021-06-22 | Disposition: A | Discharge status: Home / Self Care | Payer: Medicare Other | Attending: Emergency Medicine | Admitting: Emergency Medicine

## 2021-06-22 DIAGNOSIS — R002 Palpitations: Secondary | ICD-10-CM | POA: Insufficient documentation

## 2021-06-22 DIAGNOSIS — Z5321 Procedure and treatment not carried out due to patient leaving prior to being seen by health care provider: Secondary | ICD-10-CM | POA: Diagnosis not present

## 2021-06-22 DIAGNOSIS — G8929 Other chronic pain: Secondary | ICD-10-CM | POA: Diagnosis not present

## 2021-06-22 DIAGNOSIS — M25552 Pain in left hip: Secondary | ICD-10-CM | POA: Diagnosis not present

## 2021-06-22 LAB — CBC
HCT: 41.2 % (ref 39.0–52.0)
Hemoglobin: 14.4 g/dL (ref 13.0–17.0)
MCH: 30.6 pg (ref 26.0–34.0)
MCHC: 35 g/dL (ref 30.0–36.0)
MCV: 87.5 fL (ref 80.0–100.0)
Platelets: 178 10*3/uL (ref 150–400)
RBC: 4.71 MIL/uL (ref 4.22–5.81)
RDW: 13.2 % (ref 11.5–15.5)
WBC: 8.8 10*3/uL (ref 4.0–10.5)
nRBC: 0 % (ref 0.0–0.2)

## 2021-06-22 LAB — BASIC METABOLIC PANEL
Anion gap: 10 (ref 5–15)
BUN: 8 mg/dL (ref 8–23)
CO2: 29 mmol/L (ref 22–32)
Calcium: 9.3 mg/dL (ref 8.9–10.3)
Chloride: 100 mmol/L (ref 98–111)
Creatinine, Ser: 0.8 mg/dL (ref 0.61–1.24)
GFR, Estimated: >60 mL/min (ref >60)
Glucose, Bld: 139 mg/dL — ABNORMAL HIGH (ref 70–99)
Potassium: 2.8 mmol/L — ABNORMAL LOW (ref 3.5–5.1)
Sodium: 139 mmol/L (ref 135–145)

## 2021-06-22 LAB — TROPONIN I (HIGH SENSITIVITY): Troponin I (High Sensitivity): 11 ng/L (ref <18)

## 2021-06-22 NOTE — ED Triage Notes (Signed)
Patient states that he was laying down for nap and developed palpitations and was sure he was in a.FIB. also found his BP elevated. On arrival no complaints other than some chronic left hip pain

## 2021-06-22 NOTE — ED Notes (Signed)
Pt did not want to stay in triage to be seen by the PA, pt asked writer to take him to the lobby so he could lay down.

## 2021-06-22 NOTE — ED Notes (Signed)
Pt notified staff that he was leaving.

## 2021-06-24 ENCOUNTER — Telehealth: Payer: Self-pay | Admitting: Internal Medicine

## 2021-06-24 NOTE — Telephone Encounter (Signed)
Called to report pt is refusing PT services.   States he kept postponing and pt is being dismissive. States "do not come."

## 2021-07-02 DIAGNOSIS — N401 Enlarged prostate with lower urinary tract symptoms: Secondary | ICD-10-CM | POA: Diagnosis not present

## 2021-07-02 DIAGNOSIS — N138 Other obstructive and reflux uropathy: Secondary | ICD-10-CM | POA: Diagnosis not present

## 2021-07-03 ENCOUNTER — Encounter: Payer: Self-pay | Admitting: Orthopaedic Surgery

## 2021-07-03 ENCOUNTER — Ambulatory Visit (INDEPENDENT_AMBULATORY_CARE_PROVIDER_SITE_OTHER): Payer: Medicare Other | Admitting: Orthopaedic Surgery

## 2021-07-03 VITALS — Ht 76.0 in | Wt 287.0 lb

## 2021-07-03 DIAGNOSIS — M1612 Unilateral primary osteoarthritis, left hip: Secondary | ICD-10-CM

## 2021-07-03 NOTE — Progress Notes (Signed)
Office Visit Note   Patient: Brandon [name].           Date of Birth: 1950/02/21           MRN: [id number] Visit Date: 07/03/2021              Requested by: Brandon [name] [address] [place],  [place] [address] PCP: Brandon [name]   Assessment & Plan: Visit Diagnoses:  1. Primary osteoarthritis of left hip     Plan: Impression is advanced left hip DJD.  Symptoms have improved since starting the prednisone.  He may consider doing a cortisone injection if the symptoms recur.  He will message me if he wants me to put in a referral.  Otherwise he will follow-up as needed.  Follow-Up Instructions: No follow-ups on file.   Orders:  No orders of the defined types were placed in this encounter.  No orders of the defined types were placed in this encounter.     Procedures: No procedures performed   Clinical Data: No additional findings.   Subjective: Chief Complaint  Patient presents with   Left Hip - Pain    HPI Brandon [name] is a 71 year old gentleman who began having severe left hip pain on 06/04/2021.  Denies any injuries.  Went to the ED that day and was started on prednisone.  X-rays demonstrate advanced left hip DJD.  He has a malunion of his proximal femur from 2 traumatic injuries in the 1970s.  Ambulates with a cane.  Feels like the prednisone has helped and has felt relief in his hip.  Review of Systems  Constitutional: Negative.   All other systems reviewed and are negative.    Objective: Vital Signs: Ht $RemoveB'6\' 4"'WqoaFPqr$  (1.93 m)   Wt 287 lb (130.2 kg)   BMI 34.93 kg/m   Physical Exam Vitals and nursing note reviewed.  Constitutional:      Appearance: He is well-developed.  HENT:     Head: Normocephalic and atraumatic.  Eyes:     Pupils: Pupils are equal, round, and reactive to light.  Pulmonary:     Effort: Pulmonary effort is normal.  Abdominal:     Palpations: Abdomen is soft.  Musculoskeletal:        General: Normal range of motion.      Cervical back: Neck supple.  Skin:    General: Skin is warm.  Neurological:     Mental Status: He is alert and oriented to person, place, and time.  Psychiatric:        Behavior: Behavior normal.        Thought Content: Thought content normal.        Judgment: Judgment normal.     Ortho Exam Examination left hip shows pain with hip flexion and internal rotation.  No sciatic tension signs.  Antalgic gait. Specialty Comments:  No specialty comments available.  Imaging: No results found.   PMFS History: Patient Active Problem List   Diagnosis Date Noted   Primary osteoarthritis of left hip 06/20/2021   Chronic idiopathic constipation 06/10/2021   Long-term current use of opiate analgesic 06/10/2021   acute on chronic hypokalemia 06/04/2021   Prurigo nodularis 02/04/2021   Hyperlipidemia LDL goal <130 02/03/2021   Seasonal allergic rhinitis due to pollen 02/03/2021   Chronic hypokalemia 02/03/2021   Morbid obesity ([place]) 12/24/2020   Spinal stenosis, lumbar region with neurogenic claudication    Olecranon bursitis 01/12/2020   SVT (supraventricular tachycardia) (  [place]) 01/21/2019   BPH (benign prostatic hyperplasia) 10/21/2016   Essential hypertension 10/21/2016   Depression, major, single episode, complete remission ([place]) 10/21/2016   Paroxysmal atrial fibrillation (HCC)    Restless legs syndrome 02/08/2014   Generalized anxiety disorder 12/12/2013   Current use of long term anticoagulation    Obstructive sleep apnea 03/09/2013   History of pulmonary embolism 03/19/2012   Past Medical History:  Diagnosis Date   Anxiety    Arthritis    Benign neoplasm of colon    Benign prostatic hyperplasia with urinary obstruction    Bilateral inguinal hernia 05/29/2011   Bilateral pulmonary embolism ([place]) 3/14   admitted to [facility],  treated with Xarelto   Breast lump 03/29/2019   Calculus of kidney 10/21/2016   Cataract    Chronic bilateral low back pain with sciatica  03/21/2012   Chronic pain    Finger mass, right 10/17/2020   History of alcohol abuse 03/07/2016   History of colon polyps 11/18/2015   History of kidney stones    History of pulmonary embolism 03/19/2012   Hyperlipemia    Hypertension    Insomnia    Insomnia due to medical condition 12/23/2016   Polyuria secondary to BPH   Major depressive disorder, recurrent episode ([place])    Mitral regurgitation 04/24/2013   Mild by TEE   Morbid obesity ([place]) 03/07/2016   Nonrheumatic mitral valve insufficiency 04/25/2013   Mild by TEE  Overview:  Overview:  Mild by TEE Overview:  Overview:  Overview:  Mild by TEE Overview:  Overview:  Mild by TEE   Obesity    OSA (obstructive sleep apnea)    noncompliant with CPAP.  07/27/13- awaiting a CPAP- unable to tolerate mask   Osteoporosis    Persistent atrial fibrillation (HCC)    Recurrent unilateral inguinal hernia 07/17/2013   Restless leg syndrome    takes gabapentin   S/P right total knee arthroplasty 05/24/2018   Sciatica    Spinal stenosis, lumbar region with neurogenic claudication    SVT (supraventricular tachycardia) ([place]) 03/07/2016   Thrombus of left atrial appendage 1/0/1751   Uncomplicated asthma 0/25/8527   Overview:  Overview:  Overview:  Overview:  Qualifier: Diagnosis of  By: Sherren Mocha [name], Jory Ee Overview:  Overview:  Qualifier: Diagnosis of  By: Sherren Mocha [name], Jory Ee   Ventral hernia, unspecified, without mention of obstruction or gangrene    right abdominal wall    Family History  Problem Relation Age of Onset   Cancer Father        bone   Heart failure Mother    Hypertension Mother    Asthma Mother    Cancer Paternal Grandmother        ovarian   CVA Other        Fam Hx of multiple myeloma   Diabetes Other        Fam Hx of DM    Past Surgical History:  Procedure Laterality Date   CARDIOVERSION N/A 03/21/2012   Procedure: CARDIOVERSION;  Surgeon: Birdie Riddle, [name];  Location: [facility] ENDOSCOPY;  Service: Cardiovascular;  Laterality: N/A;    CARDIOVERSION N/A 04/24/2013   Procedure: CARDIOVERSION;  Surgeon: Dorothy Spark, [name];  Location[facility] ENDOSCOPY;  Service: Cardiovascular;  Laterality: N/A;   CARDIOVERSION N/A 06/27/2015   Procedure: CARDIOVERSION;  Surgeon: Larey Dresser, [name];  Location: [facility];  Service: Cardiovascular;  Laterality: N/A;   CATARACT EXTRACTION     COLONOSCOPY W/ POLYPECTOMY  ELECTROPHYSIOLOGIC STUDY N/A 07/30/2015   Procedure: Atrial Fibrillation Ablation;  Surgeon: Thompson Grayer, [name];  Location: [place] CV LAB;  Service: Cardiovascular;  Laterality: N/A;   EXTRACORPOREAL SHOCK WAVE LITHOTRIPSY Right 10/29/2016   Procedure: RIGHT EXTRACORPOREAL SHOCK WAVE LITHOTRIPSY (ESWL);  Surgeon: Alexis Frock, [name];  Location: [facility];  Service: Urology;  Laterality: Right;   EYE SURGERY Right    cataract   FEMUR FRACTURE SURGERY     HERNIA REPAIR  07/06/2011   INGUINAL HERNIA REPAIR Right 07/28/2013   Procedure: RIGHT INGUINAL HERNIA REPAIR;  Surgeon: Imogene Burn. Georgette Dover, [name];  Location: [facility];  Service: General;  Laterality: Right;   INGUINAL HERNIA REPAIR Right 09/22/2016   Procedure: LAPAROSCOPIC RIGHT INGUINAL HERNIA;  Surgeon: Kieth Brightly Arta Bruce, [name];  Location: [facility];  Service: General;  Laterality: Right;  With MESH   INSERTION OF MESH Right 07/28/2013   Procedure: INSERTION OF MESH;  Surgeon: Imogene Burn. Georgette Dover, [name];  Location: [place];  Service: General;  Laterality: Right;   KNEE ARTHROSCOPY     left   REPLACEMENT TOTAL KNEE Left    ROTATOR CUFF REPAIR     right   ROTATOR CUFF REPAIR Left 03/08/2014   DR SUPPLE   SHOULDER ARTHROSCOPY WITH ROTATOR CUFF REPAIR AND SUBACROMIAL DECOMPRESSION Left 03/08/2014   Procedure: LEFT SHOULDER ARTHROSCOPY WITH ROTATOR CUFF REPAIR/SUBACROMIAL DECOMPRESSION/DISTAL CLAVICLE RESECTION;  Surgeon: Marin Shutter, [name];  Location: [facility];  Service: Orthopedics;  Laterality: Left;   SVT ABLATION N/A 01/23/2019   Procedure: SVT ABLATION;  Surgeon: Evans Lance, [name];   Location: [place] CV LAB;  Service: Cardiovascular;  Laterality: N/A;   TEE WITHOUT CARDIOVERSION N/A 03/21/2012   Procedure: TRANSESOPHAGEAL ECHOCARDIOGRAM (TEE);  Surgeon: Birdie Riddle, [name];  Location: [facility];  Service: Cardiovascular;  Laterality: N/A;   TEE WITHOUT CARDIOVERSION N/A 04/24/2013   Procedure: TRANSESOPHAGEAL ECHOCARDIOGRAM (TEE);  Surgeon: Dorothy Spark, [name];  Location: [facility];  Service: Cardiovascular;  Laterality: N/A;   TEE WITHOUT CARDIOVERSION N/A 07/29/2015   Procedure: TRANSESOPHAGEAL ECHOCARDIOGRAM (TEE);  Surgeon: Fay Records, [name];  Location: [place];  Service: Cardiovascular;  Laterality: N/A;   TONSILLECTOMY     TOTAL KNEE ARTHROPLASTY Right 05/24/2018   Procedure: RIGHT TOTAL KNEE ARTHROPLASTY;  Surgeon: Paralee Cancel, [name];  Location: [facility];  Service: Orthopedics;  Laterality: Right;  70 mins   Social History   Occupational History   Not on file  Tobacco Use   Smoking status: Former    Packs/day: 0.50    Years: 5.00    Total pack years: 2.50    Types: Cigarettes    Quit date: 71    Years since quitting: 47.5   Smokeless tobacco: Never  Vaping Use   Vaping Use: Never used  Substance and Sexual Activity   Alcohol use: No    Comment: Former EtOH abuse, stopped 06/2016   Drug use: No    Comment: negative hx for IV drug abuse   Sexual activity: Not on file

## 2021-07-09 ENCOUNTER — Encounter: Payer: Self-pay | Admitting: Internal Medicine

## 2021-07-09 ENCOUNTER — Ambulatory Visit (INDEPENDENT_AMBULATORY_CARE_PROVIDER_SITE_OTHER): Payer: Medicare Other | Admitting: Internal Medicine

## 2021-07-09 VITALS — BP 130/78 | HR 67 | Temp 98.1°F | Ht 76.0 in | Wt 278.0 lb

## 2021-07-09 DIAGNOSIS — I10 Essential (primary) hypertension: Secondary | ICD-10-CM

## 2021-07-09 DIAGNOSIS — L281 Prurigo nodularis: Secondary | ICD-10-CM | POA: Diagnosis not present

## 2021-07-09 DIAGNOSIS — R739 Hyperglycemia, unspecified: Secondary | ICD-10-CM | POA: Insufficient documentation

## 2021-07-09 DIAGNOSIS — G2581 Restless legs syndrome: Secondary | ICD-10-CM | POA: Insufficient documentation

## 2021-07-09 DIAGNOSIS — E876 Hypokalemia: Secondary | ICD-10-CM

## 2021-07-09 LAB — BASIC METABOLIC PANEL
BUN: 14 mg/dL (ref 6–23)
CO2: 34 mEq/L — ABNORMAL HIGH (ref 19–32)
Calcium: 9.3 mg/dL (ref 8.4–10.5)
Chloride: 100 mEq/L (ref 96–112)
Creatinine, Ser: 0.83 mg/dL (ref 0.40–1.50)
GFR: 87.1 mL/min (ref >60.00)
Glucose, Bld: 88 mg/dL (ref 70–99)
Potassium: 3 mEq/L — ABNORMAL LOW (ref 3.5–5.1)
Sodium: 143 mEq/L (ref 135–145)

## 2021-07-09 LAB — HEMOGLOBIN A1C: Hgb A1c MFr Bld: 5.7 % (ref 4.6–6.5)

## 2021-07-09 LAB — MAGNESIUM: Magnesium: 2 mg/dL (ref 1.5–2.5)

## 2021-07-09 MED ORDER — POTASSIUM CHLORIDE CRYS ER 20 MEQ PO TBCR: 20.0000 meq | EXTENDED_RELEASE_TABLET | Freq: Every day | ORAL | 0 refills | Status: DC

## 2021-07-09 MED ORDER — MEMANTINE HCL 5 MG PO TABS: 5.0000 mg | ORAL_TABLET | Freq: Two times a day (BID) | ORAL | 0 refills | Status: DC

## 2021-07-09 MED ORDER — GABAPENTIN 800 MG PO TABS: 800.0000 mg | ORAL_TABLET | Freq: Three times a day (TID) | ORAL | 1 refills | Status: DC

## 2021-07-09 NOTE — Progress Notes (Unsigned)
Subjective:  Patient ID: Brandon [name]., male    DOB: 1950-06-14  Age: 71 y.o. MRN: [id number]  CC: Hypertension   HPI Brandon B Takacs Jr. presents for f/up-  He continues to be concerned about skin lesions.  He has used capsaicin with some benefit but lesions continue to appear and he excoriates them.  He denies chest pain, shortness of breath, palpitations, diaphoresis, dizziness, lightheadedness, or edema.  Outpatient Medications Prior to Visit  Medication Sig Dispense Refill   amLODipine (NORVASC) 5 MG tablet TAKE 1 TABLET BY MOUTH EVERY DAY (Patient taking differently: Take 5 mg by mouth every evening.) 90 tablet 3   atorvastatin (LIPITOR) 40 MG tablet Take 1 tablet (40 mg total) by mouth daily. 90 tablet 3   carvedilol (COREG) 25 MG tablet Take 1 tablet (25 mg total) by mouth 2 (two) times daily with a meal. 180 tablet 3   dutasteride (AVODART) 0.5 MG capsule Take 0.5 mg by mouth daily.     ELIQUIS 5 MG TABS tablet TAKE 1 TABLET BY MOUTH TWICE A DAY (Patient taking differently: Take 5 mg by mouth 2 (two) times daily.) 60 tablet 5   escitalopram (LEXAPRO) 20 MG tablet Take 1 tablet (20 mg total) by mouth daily. 90 tablet 3   fluticasone (FLONASE) 50 MCG/ACT nasal spray Place 2 sprays into both nostrils daily. (Patient taking differently: Place 2 sprays into both nostrils daily as needed for allergies.) 48 g 1   irbesartan (AVAPRO) 300 MG tablet Take 1 tablet (300 mg total) by mouth daily. 90 tablet 1   lubiprostone (AMITIZA) 24 MCG capsule Take 1 capsule (24 mcg total) by mouth 2 (two) times daily with a meal. 180 capsule 0   Magnesium Gluconate 550 MG TABS TAKE 1 TABLET BY MOUTH 2 TIMES DAILY. 60 tablet 0   MYRBETRIQ 25 MG TB24 tablet Take 25 mg by mouth daily.     gabapentin (NEURONTIN) 800 MG tablet TAKE 1 TABLET IN THE EVENING AND 1 TABLET AT BEDTIME. 180 tablet 3   oxyCODONE ER (XTAMPZA ER) 9 MG C12A Take 1 tablet by mouth 2 (two) times daily as needed. 60 capsule 0    potassium chloride SA (KLOR-CON M) 10 MEQ tablet Take 2 tablets (20 mEq total) by mouth at bedtime. 60 tablet 0   No facility-administered medications prior to visit.    ROS Review of Systems  Constitutional: Negative.  Negative for diaphoresis and fatigue.  HENT: Negative.    Eyes: Negative.   Respiratory:  Negative for cough, chest tightness, shortness of breath and wheezing.   Cardiovascular:  Negative for chest pain, palpitations and leg swelling.  Gastrointestinal:  Positive for constipation. Negative for abdominal pain, diarrhea, nausea and vomiting.  Endocrine: Negative.   Genitourinary: Negative.  Negative for difficulty urinating.  Musculoskeletal: Negative.   Skin:  Positive for rash and wound.  Neurological:  Negative for dizziness, weakness, light-headedness and headaches.  Hematological:  Negative for adenopathy. Does not bruise/bleed easily.  Psychiatric/Behavioral: Negative.      Objective:  BP 130/78 (BP Location: Left Arm, Patient Position: Sitting, Cuff Size: Large)   Pulse 67   Temp 98.1 F (36.7 C) (Oral)   Ht '6\' 4"'$  (1.93 m)   Wt 278 lb (126.1 kg)   SpO2 98%   BMI 33.84 kg/m   BP Readings from Last 3 Encounters:  07/09/21 130/78  06/22/21 (!) 140/94  06/20/21 118/84  Wt Readings from Last 3 Encounters:  07/09/21 278 lb (126.1 kg)  07/03/21 287 lb (130.2 kg)  06/20/21 281 lb (127.5 kg)    Physical Exam Vitals reviewed.  HENT:     Nose: Nose normal.     Mouth/Throat:     Mouth: Mucous membranes are moist.  Eyes:     General: No scleral icterus.    Conjunctiva/sclera: Conjunctivae normal.  Cardiovascular:     Rate and Rhythm: Normal rate and regular rhythm.     Heart sounds: No murmur heard. Pulmonary:     Effort: Pulmonary effort is normal.     Breath sounds: No stridor. No wheezing, rhonchi or rales.  Abdominal:     General: Abdomen is flat.     Palpations: There is no mass.     Tenderness: There is no abdominal tenderness. There  is no guarding.     Hernia: No hernia is present.  Musculoskeletal:        General: Normal range of motion.     Cervical back: Neck supple.     Right lower leg: No edema.     Left lower leg: No edema.  Lymphadenopathy:     Cervical: No cervical adenopathy.  Skin:    Findings: Lesion present.  Neurological:     General: No focal deficit present.     Mental Status: He is alert.  Psychiatric:        Mood and Affect: Mood normal.     Lab Results  Component Value Date   WBC 8.8 06/22/2021   HGB 14.4 06/22/2021   HCT 41.2 06/22/2021   PLT 178 06/22/2021   GLUCOSE 88 07/09/2021   CHOL 135 02/03/2021   TRIG 114.0 02/03/2021   HDL 32.80 (L) 02/03/2021   LDLDIRECT 138 (H) 10/21/2016   LDLCALC 80 02/03/2021   ALT 12 06/20/2021   AST 18 06/20/2021   NA 143 07/09/2021   K 3.0 (L) 07/09/2021   CL 100 07/09/2021   CREATININE 0.83 07/09/2021   BUN 14 07/09/2021   CO2 34 (H) 07/09/2021   TSH 2.01 02/03/2021   PSA 1.64 12/25/2020   INR 2.1 04/01/2018   HGBA1C 5.7 07/09/2021    No results found.  Assessment & Plan:   Brandon [name] was seen today for hypertension.  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 -     memantine (NAMENDA) 5 MG tablet; Take 1 tablet (5 mg total) by mouth 2 (two) times daily. -     gabapentin (NEURONTIN) 800 MG tablet; Take 1 tablet (800 mg total) by mouth 3 (three) times daily.  Restless leg syndrome -     gabapentin (NEURONTIN) 800 MG tablet; Take 1 tablet (800 mg total) by mouth 3 (three) times daily.  Essential hypertension- His blood pressure is well controlled. -     Basic metabolic panel; Future -     Magnesium; Future -     Magnesium -     Basic metabolic panel -     potassium chloride SA (KLOR-CON M) 20 MEQ tablet; Take 1 tablet (20 mEq total) by mouth daily.  Chronic hyperglycemia -     Basic metabolic panel; Future -     Hemoglobin A1c; Future -     Hemoglobin A1c -      Basic metabolic panel  Chronic hypokalemia- Will increase the dose of the potassium supplement. -     Basic metabolic panel; Future -  Magnesium; Future -     Magnesium -     Basic metabolic panel -     potassium chloride SA (KLOR-CON M) 20 MEQ tablet; Take 1 tablet (20 mEq total) by mouth daily.   I have discontinued Yale B. Dominski Jr.'s potassium chloride and Xtampza ER. I have also changed his gabapentin. Additionally, I am having him start on memantine and potassium chloride SA. Lastly, I am having him maintain his irbesartan, amLODipine, atorvastatin, fluticasone, Myrbetriq, carvedilol, Eliquis, escitalopram, dutasteride, lubiprostone, and Magnesium Gluconate.  Meds ordered this encounter  Medications   memantine (NAMENDA) 5 MG tablet    Sig: Take 1 tablet (5 mg total) by mouth 2 (two) times daily.    Dispense:  60 tablet    Refill:  0   gabapentin (NEURONTIN) 800 MG tablet    Sig: Take 1 tablet (800 mg total) by mouth 3 (three) times daily.    Dispense:  270 tablet    Refill:  1   potassium chloride SA (KLOR-CON M) 20 MEQ tablet    Sig: Take 1 tablet (20 mEq total) by mouth daily.    Dispense:  180 tablet    Refill:  0     Follow-up: Return in about 3 months (around 10/09/2021).  Scarlette Calico, MD

## 2021-07-09 NOTE — Patient Instructions (Signed)
Hypertension, Adult High blood pressure (hypertension) is when the force of blood pumping through the arteries is too strong. The arteries are the blood vessels that carry blood from the heart throughout the body. Hypertension forces the heart to work harder to pump blood and may cause arteries to become narrow or stiff. Untreated or uncontrolled hypertension can lead to a heart attack, heart failure, a stroke, kidney disease, and other problems. A blood pressure reading consists of a higher number over a lower number. Ideally, your blood pressure should be below 120/80. The first ("top") number is called the systolic pressure. It is a measure of the pressure in your arteries as your heart beats. The second ("bottom") number is called the diastolic pressure. It is a measure of the pressure in your arteries as the heart relaxes. What are the causes? The exact cause of this condition is not known. There are some conditions that result in high blood pressure. What increases the risk? Certain factors may make you more likely to develop high blood pressure. Some of these risk factors are under your control, including: Smoking. Not getting enough exercise or physical activity. Being overweight. Having too much fat, sugar, calories, or salt (sodium) in your diet. Drinking too much alcohol. Other risk factors include: Having a personal history of heart disease, diabetes, high cholesterol, or kidney disease. Stress. Having a family history of high blood pressure and high cholesterol. Having obstructive sleep apnea. Age. The risk increases with age. What are the signs or symptoms? High blood pressure may not cause symptoms. Very high blood pressure (hypertensive crisis) may cause: Headache. Fast or irregular heartbeats (palpitations). Shortness of breath. Nosebleed. Nausea and vomiting. Vision changes. Severe chest pain, dizziness, and seizures. How is this diagnosed? This condition is diagnosed by  measuring your blood pressure while you are seated, with your arm resting on a flat surface, your legs uncrossed, and your feet flat on the floor. The cuff of the blood pressure monitor will be placed directly against the skin of your upper arm at the level of your heart. Blood pressure should be measured at least twice using the same arm. Certain conditions can cause a difference in blood pressure between your right and left arms. If you have a high blood pressure reading during one visit or you have normal blood pressure with other risk factors, you may be asked to: Return on a different day to have your blood pressure checked again. Monitor your blood pressure at home for 1 week or longer. If you are diagnosed with hypertension, you may have other blood or imaging tests to help your health care provider understand your overall risk for other conditions. How is this treated? This condition is treated by making healthy lifestyle changes, such as eating healthy foods, exercising more, and reducing your alcohol intake. You may be referred for counseling on a healthy diet and physical activity. Your health care provider may prescribe medicine if lifestyle changes are not enough to get your blood pressure under control and if: Your systolic blood pressure is above 130. Your diastolic blood pressure is above 80. Your personal target blood pressure may vary depending on your medical conditions, your age, and other factors. Follow these instructions at home: Eating and drinking  Eat a diet that is high in fiber and potassium, and low in sodium, added sugar, and fat. An example of this eating plan is called the DASH diet. DASH stands for Dietary Approaches to Stop Hypertension. To eat this way: Eat   plenty of fresh fruits and vegetables. Try to fill one half of your plate at each meal with fruits and vegetables. Eat whole grains, such as whole-wheat pasta, brown rice, or whole-grain bread. Fill about one  fourth of your plate with whole grains. Eat or drink low-fat dairy products, such as skim milk or low-fat yogurt. Avoid fatty cuts of meat, processed or cured meats, and poultry with skin. Fill about one fourth of your plate with lean proteins, such as [place], chicken without skin, beans, eggs, or tofu. Avoid pre-made and processed foods. These tend to be higher in sodium, added sugar, and fat. Reduce your daily sodium intake. Many people with hypertension should eat less than 1,500 mg of sodium a day. Do not drink alcohol if: Your health care provider tells you not to drink. You are pregnant, may be pregnant, or are planning to become pregnant. If you drink alcohol: Limit how much you have to: 0-1 drink a day for women. 0-2 drinks a day for men. Know how much alcohol is in your drink. In the [place] one drink equals one 12 oz bottle of beer (355 mL), one 5 oz glass of wine (148 mL), or one 1 oz glass of hard liquor (44 mL). Lifestyle  Work with your health care provider to maintain a healthy body weight or to lose weight. Ask what an ideal weight is for you. Get at least 30 minutes of exercise that causes your heart to beat faster (aerobic exercise) most days of the week. Activities may include walking, swimming, or biking. Include exercise to strengthen your muscles (resistance exercise), such as Pilates or lifting weights, as part of your weekly exercise routine. Try to do these types of exercises for 30 minutes at least 3 days a week. Do not use any products that contain nicotine or tobacco. These products include cigarettes, chewing tobacco, and vaping devices, such as e-cigarettes. If you need help quitting, ask your health care provider. Monitor your blood pressure at home as told by your health care provider. Keep all follow-up visits. This is important. Medicines Take over-the-counter and prescription medicines only as told by your health care provider. Follow directions carefully. Blood  pressure medicines must be taken as prescribed. Do not skip doses of blood pressure medicine. Doing this puts you at risk for problems and can make the medicine less effective. Ask your health care provider about side effects or reactions to medicines that you should watch for. Contact a health care provider if you: Think you are having a reaction to a medicine you are taking. Have headaches that keep coming back (recurring). Feel dizzy. Have swelling in your ankles. Have trouble with your vision. Get help right away if you: Develop a severe headache or confusion. Have unusual weakness or numbness. Feel faint. Have severe pain in your chest or abdomen. Vomit repeatedly. Have trouble breathing. These symptoms may be an emergency. Get help right away. Call 911. Do not wait to see if the symptoms will go away. Do not drive yourself to the hospital. Summary Hypertension is when the force of blood pumping through your arteries is too strong. If this condition is not controlled, it may put you at risk for serious complications. Your personal target blood pressure may vary depending on your medical conditions, your age, and other factors. For most people, a normal blood pressure is less than 120/80. Hypertension is treated with lifestyle changes, medicines, or a combination of both. Lifestyle changes include losing weight, eating a healthy,   low-sodium diet, exercising more, and limiting alcohol. This information is not intended to replace advice given to you by your health care provider. Make sure you discuss any questions you have with your health care provider. Document Revised: 10/29/2020 Document Reviewed: 10/29/2020 Elsevier Patient Education  2023 [employer] Inc[employer]  

## 2021-07-24 ENCOUNTER — Ambulatory Visit (INDEPENDENT_AMBULATORY_CARE_PROVIDER_SITE_OTHER): Payer: Medicare Other | Admitting: Internal Medicine

## 2021-07-24 VITALS — BP 128/80 | HR 64 | Temp 98.4°F | Ht 76.0 in | Wt 277.0 lb

## 2021-07-24 DIAGNOSIS — I480 Paroxysmal atrial fibrillation: Secondary | ICD-10-CM | POA: Diagnosis not present

## 2021-07-24 DIAGNOSIS — E269 Hyperaldosteronism, unspecified: Secondary | ICD-10-CM

## 2021-07-24 DIAGNOSIS — I10 Essential (primary) hypertension: Secondary | ICD-10-CM

## 2021-07-24 DIAGNOSIS — Z111 Encounter for screening for respiratory tuberculosis: Secondary | ICD-10-CM | POA: Diagnosis not present

## 2021-07-24 DIAGNOSIS — E876 Hypokalemia: Secondary | ICD-10-CM | POA: Diagnosis not present

## 2021-07-24 NOTE — Progress Notes (Signed)
Subjective:  Patient ID: Brandon Patricia., male    DOB: September 14, 1950  Age: 71 y.o. MRN: [id number]  CC: Hypertension and Atrial Fibrillation   HPI Brandon B Spofford Jr. presents for f/up -  He returns to have a form completed to enter a personal-care home.  He continues to complain of musculoskeletal pain but denies chest pain, shortness of breath, dizziness, or palpitations.  Outpatient Medications Prior to Visit  Medication Sig Dispense Refill   amLODipine (NORVASC) 5 MG tablet TAKE 1 TABLET BY MOUTH EVERY DAY (Patient taking differently: Take 5 mg by mouth every evening.) 90 tablet 3   atorvastatin (LIPITOR) 40 MG tablet Take 1 tablet (40 mg total) by mouth daily. 90 tablet 3   carvedilol (COREG) 25 MG tablet Take 1 tablet (25 mg total) by mouth 2 (two) times daily with a meal. 180 tablet 3   dutasteride (AVODART) 0.5 MG capsule Take 0.5 mg by mouth daily.     ELIQUIS 5 MG TABS tablet TAKE 1 TABLET BY MOUTH TWICE A DAY (Patient taking differently: Take 5 mg by mouth 2 (two) times daily.) 60 tablet 5   escitalopram (LEXAPRO) 20 MG tablet Take 1 tablet (20 mg total) by mouth daily. 90 tablet 3   fluticasone (FLONASE) 50 MCG/ACT nasal spray Place 2 sprays into both nostrils daily. (Patient taking differently: Place 2 sprays into both nostrils daily as needed for allergies.) 48 g 1   gabapentin (NEURONTIN) 800 MG tablet Take 1 tablet (800 mg total) by mouth 3 (three) times daily. 270 tablet 1   irbesartan (AVAPRO) 300 MG tablet Take 1 tablet (300 mg total) by mouth daily. 90 tablet 1   lubiprostone (AMITIZA) 24 MCG capsule Take 1 capsule (24 mcg total) by mouth 2 (two) times daily with a meal. 180 capsule 0   Magnesium Gluconate 550 MG TABS TAKE 1 TABLET BY MOUTH 2 TIMES DAILY. 60 tablet 0   memantine (NAMENDA) 5 MG tablet Take 1 tablet (5 mg total) by mouth 2 (two) times daily. 60 tablet 0   MYRBETRIQ 25 MG TB24 tablet Take 25 mg by mouth daily.     potassium chloride SA (KLOR-CON M) 20 MEQ tablet  Take 1 tablet (20 mEq total) by mouth daily. 180 tablet 0   No facility-administered medications prior to visit.    ROS Review of Systems  Constitutional:  Negative for diaphoresis and fatigue.  HENT: Negative.    Eyes: Negative.   Respiratory:  Negative for cough, chest tightness, shortness of breath and wheezing.   Cardiovascular:  Negative for chest pain, palpitations and leg swelling.  Gastrointestinal:  Negative for abdominal pain, constipation, diarrhea and nausea.  Endocrine: Negative.   Genitourinary: Negative.  Negative for difficulty urinating.  Musculoskeletal:  Positive for arthralgias and back pain.  Skin: Negative.   Neurological: Negative.  Negative for dizziness, weakness and light-headedness.  Hematological:  Negative for adenopathy. Does not bruise/bleed easily.  Psychiatric/Behavioral: Negative.      Objective:  BP 128/80 (BP Location: Left Arm, Patient Position: Sitting, Cuff Size: Large)   Pulse 64   Temp 98.4 F (36.9 C) (Oral)   Ht '6\' 4"'$  (1.93 m)   Wt 277 lb (125.6 kg)   SpO2 93%   BMI 33.72 kg/m   BP Readings from Last 3 Encounters:  07/24/21 128/80  07/09/21 130/78  06/22/21 (!) 140/94    Wt Readings from Last 3 Encounters:  07/24/21 277 lb (125.6 kg)  07/09/21 278 lb (126.1 kg)  07/03/21 287 lb (130.2 kg)    Physical Exam Vitals reviewed.  HENT:     Nose: Nose normal.     Mouth/Throat:     Mouth: Mucous membranes are moist.  Eyes:     General: No scleral icterus.    Conjunctiva/sclera: Conjunctivae normal.  Cardiovascular:     Rate and Rhythm: Normal rate and regular rhythm.     Heart sounds: No murmur heard. Pulmonary:     Effort: Pulmonary effort is normal.     Breath sounds: No stridor. No wheezing, rhonchi or rales.  Abdominal:     General: Abdomen is flat.     Palpations: There is no mass.     Tenderness: There is no abdominal tenderness. There is no guarding.     Hernia: No hernia is present.  Musculoskeletal:         General: Normal range of motion.     Cervical back: Neck supple.     Right lower leg: No edema.     Left lower leg: No edema.  Lymphadenopathy:     Cervical: No cervical adenopathy.  Skin:    General: Skin is warm and dry.  Neurological:     General: No focal deficit present.     Mental Status: He is alert.  Psychiatric:        Mood and Affect: Mood normal.        Behavior: Behavior normal.     Lab Results  Component Value Date   WBC 8.8 06/22/2021   HGB 14.4 06/22/2021   HCT 41.2 06/22/2021   PLT 178 06/22/2021   GLUCOSE 88 07/09/2021   CHOL 135 02/03/2021   TRIG 114.0 02/03/2021   HDL 32.80 (L) 02/03/2021   LDLDIRECT 138 (H) 10/21/2016   LDLCALC 80 02/03/2021   ALT 12 06/20/2021   AST 18 06/20/2021   NA 143 07/09/2021   K 3.0 (L) 07/09/2021   CL 100 07/09/2021   CREATININE 0.83 07/09/2021   BUN 14 07/09/2021   CO2 34 (H) 07/09/2021   TSH 2.01 02/03/2021   PSA 1.64 12/25/2020   INR 2.1 04/01/2018   HGBA1C 5.7 07/09/2021   DG Chest 1 View  Result Date: 06/04/2021 CLINICAL DATA:  Left hip pain, fall EXAM: CHEST  1 VIEW COMPARISON:  01/21/2019 FINDINGS: Transverse diameter of heart is increased. Thoracic aorta is ectatic. There are no signs of pulmonary edema or new focal infiltrates. There is no pleural effusion or pneumothorax. Deformity in the right clavicle most likely is residual from previous injury with no significant interval change. Degenerative changes are noted in both shoulders. IMPRESSION: Cardiomegaly. There are no signs of pulmonary edema or new focal infiltrates. Electronically Signed   By: Elmer Picker M.D.   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 No definite  recent fracture or dislocation is seen. Degenerative changes are noted in both hips with joint space narrowing and bony spurs, more severe in the left hip. IMPRESSION: No recent fracture or dislocation is seen. 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 Degenerative changes are noted in both hips, more severe in the  left hip with interval progression. Electronically Signed   By: Elmer Picker M.D.   On: 06/04/2021 12:09   CT Head Wo Contrast  Result Date: 06/04/2021 CLINICAL DATA:  Fall, head injury EXAM: CT HEAD WITHOUT CONTRAST TECHNIQUE: Contiguous axial images were obtained from the base of the skull through the vertex without intravenous contrast. RADIATION DOSE REDUCTION: This exam was performed according to the departmental dose-optimization program which includes automated exposure control, adjustment of the mA and/or kV according to patient size and/or use of iterative reconstruction technique. COMPARISON:  CT head 03/16/2012 FINDINGS: Brain: No evidence of acute infarction, hemorrhage, hydrocephalus, extra-axial collection or mass lesion/mass effect. Vascular: Negative for hyperdense vessel Skull: Negative Sinuses/Orbits: Mucosal edema paranasal sinuses. Bilateral cataract extraction Other: None IMPRESSION: Negative CT head Paranasal sinus mucosal edema. Electronically Signed   By: Franchot Gallo M.D.   On: 06/04/2021 11:47     Assessment & Plan:   Brandon [name] was seen today for hypertension and atrial fibrillation.  Diagnoses and all orders for this visit:  Tuberculosis screening -     QuantiFERON-TB Gold Plus; Future -     QuantiFERON-TB Gold Plus  Essential hypertension-his blood pressure is well controlled.  Will evaluate for hyperaldosteronism. -     Aldosterone + renin activity w/ ratio  Paroxysmal atrial fibrillation (HCC)-he has good rate and rhythm control.  Chronic  hypokalemia -     Aldosterone + renin activity w/ ratio   I am having Brandon B. Bonello Jr. maintain his irbesartan, amLODipine, atorvastatin, fluticasone, Myrbetriq, carvedilol, Eliquis, escitalopram, dutasteride, lubiprostone, Magnesium Gluconate, memantine, gabapentin, and potassium chloride SA.  No orders of the defined types were placed in this encounter.    Follow-up: No follow-ups on file.  Scarlette Calico, MD

## 2021-07-25 ENCOUNTER — Encounter: Payer: Self-pay | Admitting: Internal Medicine

## 2021-07-25 NOTE — Patient Instructions (Signed)
Hypokalemia Hypokalemia means that the amount of potassium in the blood is lower than normal. Potassium is a mineral (electrolyte) that helps regulate the amount of fluid in the body. It also stimulates muscle tightening (contraction) and helps nerves work properly. Normally, most of the body's potassium is inside cells, and only a very small amount is in the blood. Because the amount in the blood is so small, minor changes to potassium levels in the blood can be life-threatening. What are the causes? This condition may be caused by: Antibiotic medicine. Diarrhea or vomiting. Taking too much of a medicine that helps you have a bowel movement (laxative) can cause diarrhea and lead to hypokalemia. Chronic kidney disease (CKD). Medicines that help the body get rid of excess fluid (diuretics). Eating disorders, such as anorexia or bulimia. Low magnesium levels in the body. Sweating a lot. What are the signs or symptoms? Symptoms of this condition include: Weakness. Constipation. Fatigue. Muscle cramps. Mental confusion. Skipped heartbeats or irregular heartbeat (palpitations). Tingling or numbness. How is this diagnosed? This condition is diagnosed with a blood test. How is this treated? This condition may be treated by: Taking potassium supplements. Adjusting the medicines that you take. Eating more foods that contain a lot of potassium. If your potassium level is very low, you may need to get potassium through an IV and be monitored in the hospital. Follow these instructions at home: Eating and drinking  Eat a healthy diet. A healthy diet includes fresh fruits and vegetables, whole grains, healthy fats, and lean proteins. If told, eat more foods that contain a lot of potassium. These include: Nuts, such as peanuts and pistachios. Seeds, such as sunflower seeds and pumpkin seeds. Peas, lentils, and lima beans. Whole grain and bran cereals and breads. Fresh fruits and vegetables,  such as apricots, avocado, bananas, cantaloupe, kiwi, oranges, tomatoes, asparagus, and potatoes. Juices, such as orange, tomato, and prune. Lean meats, including [place]. Milk and milk products, such as yogurt. General instructions Take over-the-counter and prescription medicines only as told by your health care provider. This includes vitamins, natural food products, and supplements. Keep all follow-up visits. This is important. Contact a health care provider if: You have weakness that gets worse. You feel your heart pounding or racing. You vomit. You have diarrhea. You have diabetes and you have trouble keeping your blood sugar in your target range. Get help right away if: You have chest pain. You have shortness of breath. You have vomiting or diarrhea that lasts for more than 2 days. You faint. These symptoms may be an emergency. Get help right away. Call 911. Do not wait to see if the symptoms will go away. Do not drive yourself to the hospital. Summary Hypokalemia means that the amount of potassium in the blood is lower than normal. This condition is diagnosed with a blood test. Hypokalemia may be treated by taking potassium supplements, adjusting the medicines that you take, or eating more foods that are high in potassium. If your potassium level is very low, you may need to get potassium through an IV and be monitored in the hospital. This information is not intended to replace advice given to you by your health care provider. Make sure you discuss any questions you have with your health care provider. Document Revised: 09/05/2020 Document Reviewed: 09/05/2020 Elsevier Patient Education  2023 [employer] Inc.  

## 2021-07-29 ENCOUNTER — Encounter: Payer: Self-pay | Admitting: Orthopaedic Surgery

## 2021-08-01 ENCOUNTER — Other Ambulatory Visit: Payer: Self-pay | Admitting: Internal Medicine

## 2021-08-01 DIAGNOSIS — L281 Prurigo nodularis: Secondary | ICD-10-CM

## 2021-08-01 LAB — QUANTIFERON-TB GOLD PLUS
Mitogen-NIL: >10 IU/mL
NIL: 0.05 IU/mL
QuantiFERON-TB Gold Plus: NEGATIVE
TB1-NIL: <0 IU/mL
TB2-NIL: <0 IU/mL

## 2021-08-01 LAB — ALDOSTERONE + RENIN ACTIVITY W/ RATIO
ALDO / PRA Ratio: 50 Ratio — ABNORMAL HIGH (ref 0.9–28.9)
Aldosterone: 12 ng/dL
Renin Activity: 0.24 ng/mL/h — ABNORMAL LOW (ref 0.25–5.82)

## 2021-08-02 DIAGNOSIS — E269 Hyperaldosteronism, unspecified: Secondary | ICD-10-CM | POA: Insufficient documentation

## 2021-08-02 MED ORDER — SPIRONOLACTONE 25 MG PO TABS: 25.0000 mg | ORAL_TABLET | Freq: Every day | ORAL | 0 refills | Status: DC

## 2021-08-02 NOTE — Addendum Note (Signed)
Addended by: [name] on: 08/02/2021 10:55 AM   Modules accepted: Orders

## 2021-08-05 DIAGNOSIS — Z0289 Encounter for other administrative examinations: Secondary | ICD-10-CM

## 2021-08-15 ENCOUNTER — Encounter: Payer: Self-pay | Admitting: Internal Medicine

## 2021-08-25 ENCOUNTER — Other Ambulatory Visit: Payer: Self-pay | Admitting: Internal Medicine

## 2021-08-25 DIAGNOSIS — F331 Major depressive disorder, recurrent, moderate: Secondary | ICD-10-CM

## 2021-08-25 MED ORDER — ESCITALOPRAM OXALATE 20 MG PO TABS: 20.0000 mg | ORAL_TABLET | Freq: Every day | ORAL | 1 refills | Status: DC

## 2021-08-26 ENCOUNTER — Other Ambulatory Visit: Payer: Self-pay | Admitting: *Deleted

## 2021-08-26 NOTE — Patient Outreach (Signed)
  Care Coordination   08/26/2021 Name: Brandon [name]. MRN: [id number] DOB: November 16, 1950   Care Coordination Outreach Attempts:  An unsuccessful telephone outreach was attempted today to offer the patient information about available care coordination services as a benefit of their health plan.   Follow Up Plan:  Additional outreach attempts will be made to offer the patient care coordination information and services.   Encounter Outcome:  No Answer  Care Coordination Interventions Activated:  No   Care Coordination Interventions:  No, not indicated    SIG Orissa Arreaga C. Myrtie Neither, MSN, [facility] Gerontological Nurse Practitioner [facility] Care Management [phone]

## 2021-09-02 ENCOUNTER — Other Ambulatory Visit: Payer: Self-pay | Admitting: Internal Medicine

## 2021-09-02 DIAGNOSIS — K5904 Chronic idiopathic constipation: Secondary | ICD-10-CM

## 2021-09-02 DIAGNOSIS — M48061 Spinal stenosis, lumbar region without neurogenic claudication: Secondary | ICD-10-CM | POA: Diagnosis not present

## 2021-09-02 DIAGNOSIS — R262 Difficulty in walking, not elsewhere classified: Secondary | ICD-10-CM | POA: Diagnosis not present

## 2021-09-02 DIAGNOSIS — M5459 Other low back pain: Secondary | ICD-10-CM | POA: Diagnosis not present

## 2021-09-02 DIAGNOSIS — M1612 Unilateral primary osteoarthritis, left hip: Secondary | ICD-10-CM | POA: Diagnosis not present

## 2021-09-04 DIAGNOSIS — M5459 Other low back pain: Secondary | ICD-10-CM | POA: Diagnosis not present

## 2021-09-04 DIAGNOSIS — M48061 Spinal stenosis, lumbar region without neurogenic claudication: Secondary | ICD-10-CM | POA: Diagnosis not present

## 2021-09-04 DIAGNOSIS — R262 Difficulty in walking, not elsewhere classified: Secondary | ICD-10-CM | POA: Diagnosis not present

## 2021-09-04 DIAGNOSIS — M1612 Unilateral primary osteoarthritis, left hip: Secondary | ICD-10-CM | POA: Diagnosis not present

## 2021-09-09 DIAGNOSIS — R262 Difficulty in walking, not elsewhere classified: Secondary | ICD-10-CM | POA: Diagnosis not present

## 2021-09-09 DIAGNOSIS — M48061 Spinal stenosis, lumbar region without neurogenic claudication: Secondary | ICD-10-CM | POA: Diagnosis not present

## 2021-09-09 DIAGNOSIS — M1612 Unilateral primary osteoarthritis, left hip: Secondary | ICD-10-CM | POA: Diagnosis not present

## 2021-09-09 DIAGNOSIS — M5459 Other low back pain: Secondary | ICD-10-CM | POA: Diagnosis not present

## 2021-09-11 DIAGNOSIS — M48061 Spinal stenosis, lumbar region without neurogenic claudication: Secondary | ICD-10-CM | POA: Diagnosis not present

## 2021-09-11 DIAGNOSIS — R262 Difficulty in walking, not elsewhere classified: Secondary | ICD-10-CM | POA: Diagnosis not present

## 2021-09-11 DIAGNOSIS — M5459 Other low back pain: Secondary | ICD-10-CM | POA: Diagnosis not present

## 2021-09-11 DIAGNOSIS — M1612 Unilateral primary osteoarthritis, left hip: Secondary | ICD-10-CM | POA: Diagnosis not present

## 2021-09-16 ENCOUNTER — Telehealth: Payer: Self-pay | Admitting: *Deleted

## 2021-09-16 DIAGNOSIS — R262 Difficulty in walking, not elsewhere classified: Secondary | ICD-10-CM | POA: Diagnosis not present

## 2021-09-16 DIAGNOSIS — M48061 Spinal stenosis, lumbar region without neurogenic claudication: Secondary | ICD-10-CM | POA: Diagnosis not present

## 2021-09-16 DIAGNOSIS — M5459 Other low back pain: Secondary | ICD-10-CM | POA: Diagnosis not present

## 2021-09-16 DIAGNOSIS — M1612 Unilateral primary osteoarthritis, left hip: Secondary | ICD-10-CM | POA: Diagnosis not present

## 2021-09-16 NOTE — Patient Outreach (Signed)
  Care Coordination   09/16/2021 Name: Brandon [name]. MRN: [id number] DOB: [birth year]   Care Coordination Outreach Attempts:  A second unsuccessful outreach was attempted today to offer the patient with information about available care coordination services as a benefit of their health plan.     Follow Up Plan:  Additional outreach attempts will be made to offer the patient care coordination information and services.   Encounter Outcome:  No Answer  Care Coordination Interventions Activated:  No   Care Coordination Interventions:  No, not indicated    Jacqlyn Larsen [facility], [place] RN Care Coordinator [phone]

## 2021-09-17 ENCOUNTER — Telehealth: Payer: Self-pay | Admitting: *Deleted

## 2021-09-17 NOTE — Patient Outreach (Signed)
  Care Coordination   09/17/2021 Name: Brandon [name]. MRN: [id number] DOB: 04-26-1950   Care Coordination Outreach Attempts:  A third unsuccessful outreach was attempted today to offer the patient with information about available care coordination services as a benefit of their health plan.   Follow Up Plan:  No further outreach attempts will be made at this time. We have been unable to contact the patient to offer or enroll patient in care coordination services  Encounter Outcome:  No Answer  Care Coordination Interventions Activated:  No   Care Coordination Interventions:  No, not indicated    Jacqlyn Larsen [facility], BSN [facility] RN Care Coordinator [phone]

## 2021-09-18 DIAGNOSIS — M48061 Spinal stenosis, lumbar region without neurogenic claudication: Secondary | ICD-10-CM | POA: Diagnosis not present

## 2021-09-18 DIAGNOSIS — R262 Difficulty in walking, not elsewhere classified: Secondary | ICD-10-CM | POA: Diagnosis not present

## 2021-09-18 DIAGNOSIS — M5459 Other low back pain: Secondary | ICD-10-CM | POA: Diagnosis not present

## 2021-09-18 DIAGNOSIS — M1612 Unilateral primary osteoarthritis, left hip: Secondary | ICD-10-CM | POA: Diagnosis not present

## 2021-09-23 ENCOUNTER — Encounter: Payer: Self-pay | Admitting: Internal Medicine

## 2021-09-23 ENCOUNTER — Other Ambulatory Visit: Payer: Self-pay | Admitting: Internal Medicine

## 2021-09-23 DIAGNOSIS — G2581 Restless legs syndrome: Secondary | ICD-10-CM

## 2021-09-23 DIAGNOSIS — M48061 Spinal stenosis, lumbar region without neurogenic claudication: Secondary | ICD-10-CM | POA: Diagnosis not present

## 2021-09-23 DIAGNOSIS — M5459 Other low back pain: Secondary | ICD-10-CM | POA: Diagnosis not present

## 2021-09-23 DIAGNOSIS — R262 Difficulty in walking, not elsewhere classified: Secondary | ICD-10-CM | POA: Diagnosis not present

## 2021-09-23 DIAGNOSIS — L281 Prurigo nodularis: Secondary | ICD-10-CM

## 2021-09-23 DIAGNOSIS — M1612 Unilateral primary osteoarthritis, left hip: Secondary | ICD-10-CM | POA: Diagnosis not present

## 2021-09-23 MED ORDER — GABAPENTIN 800 MG PO TABS: 800.0000 mg | ORAL_TABLET | Freq: Three times a day (TID) | ORAL | 1 refills | Status: DC

## 2021-09-24 ENCOUNTER — Emergency Department (HOSPITAL_COMMUNITY): Payer: Medicare Other

## 2021-09-24 ENCOUNTER — Emergency Department (HOSPITAL_COMMUNITY)
Admission: EM | Admit: 2021-09-24 | Discharge: 2021-09-24 | Disposition: A | Discharge status: Home / Self Care | Payer: Medicare Other | Attending: Emergency Medicine | Admitting: Emergency Medicine

## 2021-09-24 ENCOUNTER — Encounter (HOSPITAL_COMMUNITY): Payer: Self-pay | Admitting: Emergency Medicine

## 2021-09-24 DIAGNOSIS — R112 Nausea with vomiting, unspecified: Secondary | ICD-10-CM | POA: Insufficient documentation

## 2021-09-24 DIAGNOSIS — J9811 Atelectasis: Secondary | ICD-10-CM | POA: Diagnosis not present

## 2021-09-24 DIAGNOSIS — R0789 Other chest pain: Secondary | ICD-10-CM | POA: Diagnosis not present

## 2021-09-24 DIAGNOSIS — R1084 Generalized abdominal pain: Secondary | ICD-10-CM | POA: Diagnosis not present

## 2021-09-24 DIAGNOSIS — R079 Chest pain, unspecified: Secondary | ICD-10-CM | POA: Diagnosis not present

## 2021-09-24 DIAGNOSIS — R1030 Lower abdominal pain, unspecified: Secondary | ICD-10-CM | POA: Diagnosis not present

## 2021-09-24 DIAGNOSIS — R1111 Vomiting without nausea: Secondary | ICD-10-CM | POA: Diagnosis not present

## 2021-09-24 DIAGNOSIS — R110 Nausea: Secondary | ICD-10-CM | POA: Diagnosis not present

## 2021-09-24 DIAGNOSIS — I10 Essential (primary) hypertension: Secondary | ICD-10-CM | POA: Diagnosis not present

## 2021-09-24 DIAGNOSIS — Z5321 Procedure and treatment not carried out due to patient leaving prior to being seen by health care provider: Secondary | ICD-10-CM | POA: Insufficient documentation

## 2021-09-24 DIAGNOSIS — R9431 Abnormal electrocardiogram [ECG] [EKG]: Secondary | ICD-10-CM | POA: Diagnosis not present

## 2021-09-24 DIAGNOSIS — R109 Unspecified abdominal pain: Secondary | ICD-10-CM | POA: Diagnosis not present

## 2021-09-24 LAB — COMPREHENSIVE METABOLIC PANEL
ALT: 12 U/L (ref 0–44)
AST: 16 U/L (ref 15–41)
Albumin: 4.2 g/dL (ref 3.5–5.0)
Alkaline Phosphatase: 65 U/L (ref 38–126)
Anion gap: 12 (ref 5–15)
BUN: 10 mg/dL (ref 8–23)
CO2: 25 mmol/L (ref 22–32)
Calcium: 9.4 mg/dL (ref 8.9–10.3)
Chloride: 104 mmol/L (ref 98–111)
Creatinine, Ser: 0.86 mg/dL (ref 0.61–1.24)
GFR, Estimated: >60 mL/min (ref >60)
Glucose, Bld: 122 mg/dL — ABNORMAL HIGH (ref 70–99)
Potassium: 2.7 mmol/L — CL (ref 3.5–5.1)
Sodium: 141 mmol/L (ref 135–145)
Total Bilirubin: 1.3 mg/dL — ABNORMAL HIGH (ref 0.3–1.2)
Total Protein: 7.1 g/dL (ref 6.5–8.1)

## 2021-09-24 LAB — CBC
HCT: 44.4 % (ref 39.0–52.0)
Hemoglobin: 15.5 g/dL (ref 13.0–17.0)
MCH: 30.4 pg (ref 26.0–34.0)
MCHC: 34.9 g/dL (ref 30.0–36.0)
MCV: 87.1 fL (ref 80.0–100.0)
Platelets: 172 10*3/uL (ref 150–400)
RBC: 5.1 MIL/uL (ref 4.22–5.81)
RDW: 13.1 % (ref 11.5–15.5)
WBC: 8.8 10*3/uL (ref 4.0–10.5)
nRBC: 0 % (ref 0.0–0.2)

## 2021-09-24 LAB — LIPASE, BLOOD: Lipase: 28 U/L (ref 11–51)

## 2021-09-24 LAB — TROPONIN I (HIGH SENSITIVITY): Troponin I (High Sensitivity): 13 ng/L (ref <18)

## 2021-09-24 MED ORDER — ONDANSETRON 4 MG PO TBDP
4.0000 mg | ORAL_TABLET | Freq: Once | ORAL | Status: AC | PRN
  Administered 2021-09-24: 4 mg via ORAL
  Filled 2021-09-24: qty 1

## 2021-09-24 NOTE — ED Provider Triage Note (Cosign Needed Addendum)
Emergency Medicine Provider Triage Evaluation Note  Brandon [name]. , a 71 y.o. male  was evaluated in triage.  Pt complains of nausea which began last night around 9 PM, complaining of some lower abdominal pain and multiple episodes of nonbilious emesis this morning.  Was given Zofran ODT while in triage.  Underlying history of A-fib currently anticoagulated on Eliquis. One episode of diarrhea last night. Prior surgical hx for inguinal repairs.   Review of Systems  Positive: Abdominal, nausea, vomiting Negative: Fever  Physical Exam  BP (!) 150/105 (BP Location: Right Arm)   Pulse 85   Resp 20   SpO2 97%  Gen:   Awake, no distress   Resp:  Normal effort  MSK:   Moves extremities without difficulty  Other:  Abdomen tender to palpation along the lower abdomen,bowel sounds are diminished.   Medical Decision Making  Medically screening exam initiated at 10:56 AM.  Appropriate orders placed.  Brandon B Santoyo Jr. was informed that the remainder of the evaluation will be completed by another provider, this initial triage assessment does not replace that evaluation, and the importance of remaining in the ED until their evaluation is complete.     Janeece Fitting, PA-C 09/24/21 1100    Janeece Fitting, [place] 09/24/21 1102

## 2021-09-24 NOTE — ED Notes (Signed)
Pt called for vitals; no response.

## 2021-09-24 NOTE — ED Notes (Signed)
Pt called x2 for vitals; no response.

## 2021-09-24 NOTE — ED Triage Notes (Signed)
Patient BIB [facility] from [facility] with compliant nausea, emesis, and abdominal pain that started earlier today. Patient is alert and oriented at this time and requesting nausea medication.

## 2021-09-26 ENCOUNTER — Telehealth: Payer: Self-pay | Admitting: Internal Medicine

## 2021-09-26 ENCOUNTER — Emergency Department (HOSPITAL_BASED_OUTPATIENT_CLINIC_OR_DEPARTMENT_OTHER): Payer: Medicare Other

## 2021-09-26 ENCOUNTER — Emergency Department (HOSPITAL_BASED_OUTPATIENT_CLINIC_OR_DEPARTMENT_OTHER)
Admission: EM | Admit: 2021-09-26 | Discharge: 2021-09-26 | Disposition: A | Discharge status: Home / Self Care | Payer: Medicare Other | Attending: Emergency Medicine | Admitting: Emergency Medicine

## 2021-09-26 ENCOUNTER — Encounter (HOSPITAL_BASED_OUTPATIENT_CLINIC_OR_DEPARTMENT_OTHER): Payer: Self-pay

## 2021-09-26 ENCOUNTER — Other Ambulatory Visit: Payer: Self-pay

## 2021-09-26 DIAGNOSIS — R109 Unspecified abdominal pain: Secondary | ICD-10-CM | POA: Insufficient documentation

## 2021-09-26 DIAGNOSIS — R1084 Generalized abdominal pain: Secondary | ICD-10-CM | POA: Diagnosis not present

## 2021-09-26 DIAGNOSIS — R112 Nausea with vomiting, unspecified: Secondary | ICD-10-CM | POA: Diagnosis not present

## 2021-09-26 DIAGNOSIS — I4891 Unspecified atrial fibrillation: Secondary | ICD-10-CM | POA: Insufficient documentation

## 2021-09-26 DIAGNOSIS — Z7901 Long term (current) use of anticoagulants: Secondary | ICD-10-CM | POA: Insufficient documentation

## 2021-09-26 DIAGNOSIS — I10 Essential (primary) hypertension: Secondary | ICD-10-CM | POA: Diagnosis not present

## 2021-09-26 DIAGNOSIS — N200 Calculus of kidney: Secondary | ICD-10-CM | POA: Diagnosis not present

## 2021-09-26 DIAGNOSIS — E876 Hypokalemia: Secondary | ICD-10-CM | POA: Diagnosis not present

## 2021-09-26 LAB — COMPREHENSIVE METABOLIC PANEL
ALT: 9 U/L (ref 0–44)
AST: 14 U/L — ABNORMAL LOW (ref 15–41)
Albumin: 4.7 g/dL (ref 3.5–5.0)
Alkaline Phosphatase: 59 U/L (ref 38–126)
Anion gap: 12 (ref 5–15)
BUN: 20 mg/dL (ref 8–23)
CO2: 29 mmol/L (ref 22–32)
Calcium: 10 mg/dL (ref 8.9–10.3)
Chloride: 100 mmol/L (ref 98–111)
Creatinine, Ser: 1.19 mg/dL (ref 0.61–1.24)
GFR, Estimated: >60 mL/min (ref >60)
Glucose, Bld: 121 mg/dL — ABNORMAL HIGH (ref 70–99)
Potassium: 2.7 mmol/L — CL (ref 3.5–5.1)
Sodium: 141 mmol/L (ref 135–145)
Total Bilirubin: 1.1 mg/dL (ref 0.3–1.2)
Total Protein: 7.5 g/dL (ref 6.5–8.1)

## 2021-09-26 LAB — URINALYSIS, ROUTINE W REFLEX MICROSCOPIC
Bilirubin Urine: NEGATIVE
Glucose, UA: NEGATIVE mg/dL
Ketones, ur: NEGATIVE mg/dL
Leukocytes,Ua: 25 — AB
Nitrite: NEGATIVE
Specific Gravity, Urine: 1.046 — ABNORMAL HIGH (ref 1.005–1.030)
pH: 7 (ref 5.0–8.0)

## 2021-09-26 LAB — CBC
HCT: 45.2 % (ref 39.0–52.0)
Hemoglobin: 15.8 g/dL (ref 13.0–17.0)
MCH: 30.7 pg (ref 26.0–34.0)
MCHC: 35 g/dL (ref 30.0–36.0)
MCV: 87.9 fL (ref 80.0–100.0)
Platelets: 178 10*3/uL (ref 150–400)
RBC: 5.14 MIL/uL (ref 4.22–5.81)
RDW: 13.2 % (ref 11.5–15.5)
WBC: 10.1 10*3/uL (ref 4.0–10.5)
nRBC: 0 % (ref 0.0–0.2)

## 2021-09-26 LAB — URINALYSIS, MICROSCOPIC (REFLEX)
Bacteria, UA: NONE SEEN
Squamous Epithelial / HPF: NONE SEEN (ref 0–5)

## 2021-09-26 LAB — LIPASE, BLOOD: Lipase: 10 U/L — ABNORMAL LOW (ref 11–51)

## 2021-09-26 MED ORDER — ONDANSETRON HCL 4 MG PO TABS: 4.0000 mg | ORAL_TABLET | Freq: Four times a day (QID) | ORAL | 0 refills | Status: DC

## 2021-09-26 MED ORDER — LACTATED RINGERS IV BOLUS
1000.0000 mL | Freq: Once | INTRAVENOUS | Status: AC
  Administered 2021-09-26: 1000 mL via INTRAVENOUS

## 2021-09-26 MED ORDER — IOHEXOL 300 MG/ML  SOLN
100.0000 mL | Freq: Once | INTRAMUSCULAR | Status: AC | PRN
  Administered 2021-09-26: 100 mL via INTRAVENOUS

## 2021-09-26 MED ORDER — POTASSIUM CHLORIDE CRYS ER 20 MEQ PO TBCR: 20.0000 meq | EXTENDED_RELEASE_TABLET | Freq: Two times a day (BID) | ORAL | 0 refills | Status: DC

## 2021-09-26 MED ORDER — POTASSIUM CHLORIDE 10 MEQ/100ML IV SOLN
10.0000 meq | INTRAVENOUS | Status: AC
  Administered 2021-09-26 (×2): 10 meq via INTRAVENOUS
  Filled 2021-09-26 (×2): qty 100

## 2021-09-26 NOTE — ED Provider Notes (Signed)
[place] EMERGENCY DEPT Provider Note   CSN: [id number] Arrival date & time: 09/26/21  1727     History Chief Complaint  Patient presents with   Hypertension   Abdominal Pain    HPI Brandon [name]. is a 71 y.o. male presenting for abdominal pain and nausea.  -3 days of n/v/ap presented to urgent care -Elevated blood pressure at UC today -Multiple comorbid mecial problems including Afib, SVT, HLD, HTN. On Eliquis and compliant with all medical therapies.    Patient's recorded medical, surgical, social, medication list and allergies were reviewed in the Snapshot window as part of the initial history.   Review of Systems   Review of Systems  Constitutional:  Negative for chills and fever.  HENT:  Negative for ear pain and sore throat.   Eyes:  Negative for pain and visual disturbance.  Respiratory:  Negative for cough and shortness of breath.   Cardiovascular:  Negative for chest pain and palpitations.  Gastrointestinal:  Positive for abdominal pain, nausea and vomiting.  Genitourinary:  Negative for dysuria and hematuria.  Musculoskeletal:  Negative for arthralgias and back pain.  Skin:  Negative for color change and rash.  Neurological:  Negative for seizures and syncope.  All other systems reviewed and are negative.   Physical Exam Updated Vital Signs BP (!) 172/120   Pulse 86   Temp 97.6 F (36.4 C)   Resp 15   Ht '6\' 4"'$  (1.93 m)   Wt 125.6 kg   SpO2 92%   BMI 33.72 kg/m  Physical Exam Vitals and nursing note reviewed.  Constitutional:      General: He is not in acute distress.    Appearance: He is well-developed.  HENT:     Head: Normocephalic and atraumatic.  Eyes:     Conjunctiva/sclera: Conjunctivae normal.  Cardiovascular:     Rate and Rhythm: Normal rate and regular rhythm.     Heart sounds: No murmur heard. Pulmonary:     Effort: Pulmonary effort is normal. No respiratory distress.     Breath sounds: Normal breath sounds.   Abdominal:     Palpations: Abdomen is soft.     Tenderness: There is generalized abdominal tenderness. There is no guarding.  Musculoskeletal:        General: No swelling.     Cervical back: Neck supple.  Skin:    General: Skin is warm and dry.     Capillary Refill: Capillary refill takes less than 2 seconds.  Neurological:     Mental Status: He is alert.  Psychiatric:        Mood and Affect: Mood normal.      ED Course/ Medical Decision Making/ A&P    Procedures Procedures   Medications Ordered in ED Medications  potassium chloride 10 mEq in 100 mL IVPB (0 mEq Intravenous Stopped 09/26/21 2111)  lactated ringers bolus 1,000 mL (1,000 mLs Intravenous New Bag/Given 09/26/21 2013)  iohexol (OMNIPAQUE) 300 MG/ML solution 100 mL (100 mLs Intravenous Contrast Given 09/26/21 1901)   Medical Decision Making:   Brandon [name]. is a 71 y.o. male who presented to the ED today with abdominal pain, detailed above.    Patient's presentation is complicated by their history of multiple comorbid medical problems.  Patient placed on continuous vitals and telemetry monitoring while in ED which was reviewed periodically.  Complete initial physical exam performed, notably the patient  was HDS in NAD.     Reviewed and confirmed nursing documentation  for past medical history, family history, social history.    Initial Assessment:   With the patient's presentation of abdominal pain, most likely diagnosis is nonspecific abdominal pain. Other diagnoses were considered including (but not limited to) gastroenteritis, colitis, small bowel obstruction, appendicitis, cholecystitis, pancreatitis, nephrolithiasis, UTI, pyleonephritis, testicular torsion. These are considered less likely due to history of present illness and physical exam findings.   This is most consistent with an acute life/limb threatening illness complicated by underlying chronic conditions.   Initial Plan:  CBC/CMP to evaluate for  underlying infectious/metabolic etiology for patient's abdominal pain  Lipase to evaluate for pancreatitis  EKG to evaluate for cardiac source of pain  CTAB/Pelvis with contrast to evaluate for structural/surgical etiology of patients' severe abdominal pain.  Urinalysis and repeat physical assessment to evaluate for UTI/Pyelonpehritis  Empiric management of symptoms with escalating pain control and antiemetics as needed.   Initial Study Results:   Laboratory  All laboratory results reviewed without evidence of clinically relevant pathology.   Hypokalemia appreciated appears chronic   EKG EKG was reviewed independently. Rate, rhythm, axis, intervals all examined and without medically relevant abnormality. ST segments without concerns for elevations.    Radiology All images reviewed independently. Agree with radiology report at this time.   CT ABDOMEN PELVIS W CONTRAST  Result Date: 09/26/2021 CLINICAL DATA:  Abdominal pain EXAM: CT ABDOMEN AND PELVIS WITH CONTRAST TECHNIQUE: Multidetector CT imaging of the abdomen and pelvis was performed using the standard protocol following bolus administration of intravenous contrast. RADIATION DOSE REDUCTION: This exam was performed according to the departmental dose-optimization program which includes automated exposure control, adjustment of the mA and/or kV according to patient size and/or use of iterative reconstruction technique. CONTRAST:  16m OMNIPAQUE IOHEXOL 300 MG/ML  SOLN COMPARISON:  05/15/2017 FINDINGS: Lower chest: There are linear densities in both lower lung fields suggesting scarring or subsegmental atelectasis. Hepatobiliary: There is fatty infiltration in liver. Liver measures 21.1 cm in length. There is subtle decreased density in liver close to falciform ligament suggesting possible aberrant venous drainage. There is no dilation of bile ducts. Gallbladder is not distended. Pancreas: There is fatty infiltration. No focal abnormalities are  seen. Diverticulum is noted along the inner margin of duodenum. Spleen: Unremarkable. Adrenals/Urinary Tract: Adrenals are unremarkable. There is no hydronephrosis. There is 3 mm calculus in the lower pole of left kidney. There is 2.9 cm smooth marginated lesion in the medial aspect of right kidney. Density measurements range down to -50 Hounsfield units. Findings suggest possible angiomyolipoma. There is possible 7 mm cyst in the medial lower pole of left kidney. There are few other smaller low-density foci in both kidneys. Stomach/Bowel: Small hiatal hernia is seen. Stomach is not distended. Small bowel loops are not dilated. Cecum is higher than usual in position. Appendix is not dilated. There is no significant wall thickening in colon. There is no pericolic stranding. Vascular/Lymphatic: Scattered arterial calcifications are seen. Abdominal aorta is tortuous. Reproductive: There is inhomogeneous attenuation in prostate. Small calcifications are seen. Other: There is no ascites or pneumoperitoneum. Small umbilical hernia containing fat is seen. Bilateral inguinal hernias containing fat are noted. Musculoskeletal: Degenerative changes are noted in lumbar spine, more severe at L2-L3 level on the right side. Degenerative changes are noted in both hips, more so on the left side. IMPRESSION: There is no evidence of intestinal obstruction or pneumoperitoneum. Appendix is not dilated. There is no hydronephrosis. Enlarged fatty liver. Small hiatal hernia. Possible 2.9 cm angiomyolipoma in the right kidney.  There are few subcentimeter cysts in both kidneys. There is 3 mm calculus in the right kidney. Other findings as described in the body of the report. Electronically Signed   By: Elmer Picker M.D.   On: 09/26/2021 19:40   DG Chest 2 View  Result Date: 09/24/2021 CLINICAL DATA:  Chest pain EXAM: CHEST - 2 VIEW COMPARISON:  06/04/2021 FINDINGS: Heart is normal size. Tortuous aorta. Left base atelectasis. Right  lung clear. No effusions or acute bony abnormality. IMPRESSION: Left base atelectasis. No active disease. Electronically Signed   By: Rolm Baptise M.D.   On: 09/24/2021 11:29       Final Reassessment and Plan:   Patient reassessed after 4 hours in emergency department.  He has had complete resolution of symptoms.  He is tolerated p.o. intake.  He is now ambulatory.  Nonspecific etiology of pain.  Appears to be a developing colitis versus gastroenteritis though remains nonspecific.  He is currently ambulatory tolerating p.o. intake and feels grossly symptomatically improved.  Will prescribe patient short course of outpatient potassium replacement after runs of IV potassium in emergency department and symptomatic improvement.  Patient states he is used to having low potassium as he has done within the past and will follow-up with his primary care provider regarding this issue.  Patient stable for outpatient care management with supportive care at this time.  Patient discharged with no further acute events.     Clinical Impression:  1. Nausea and vomiting, unspecified vomiting type   2. Hypokalemia      Discharge   Final Clinical Impression(s) / ED Diagnoses Final diagnoses:  Nausea and vomiting, unspecified vomiting type  Hypokalemia    Rx / DC Orders ED Discharge Orders          Ordered    potassium chloride SA (KLOR-CON M) 20 MEQ tablet  2 times daily        09/26/21 2020    ondansetron (ZOFRAN) 4 MG tablet  Every 6 hours        09/26/21 2020              Tretha Sciara, MD 09/26/21 2141

## 2021-09-26 NOTE — ED Triage Notes (Signed)
Pt arrives today with complaints of hypertension- 181/123 @ 1700, 196/143 at 1707. Pt was given beta blocker at 1600.  Pt was seen at UC earlier for nausea and vomiting.

## 2021-09-26 NOTE — ED Notes (Signed)
Pt aware of the need for the urine. Unable at the moment.

## 2021-09-28 ENCOUNTER — Other Ambulatory Visit: Payer: Self-pay

## 2021-09-28 ENCOUNTER — Encounter (HOSPITAL_BASED_OUTPATIENT_CLINIC_OR_DEPARTMENT_OTHER): Payer: Self-pay | Admitting: Emergency Medicine

## 2021-09-28 DIAGNOSIS — E876 Hypokalemia: Secondary | ICD-10-CM | POA: Diagnosis not present

## 2021-09-28 DIAGNOSIS — I10 Essential (primary) hypertension: Secondary | ICD-10-CM | POA: Diagnosis not present

## 2021-09-28 DIAGNOSIS — Z96611 Presence of right artificial shoulder joint: Secondary | ICD-10-CM | POA: Diagnosis not present

## 2021-09-28 DIAGNOSIS — Z96653 Presence of artificial knee joint, bilateral: Secondary | ICD-10-CM | POA: Diagnosis not present

## 2021-09-28 DIAGNOSIS — Z79899 Other long term (current) drug therapy: Secondary | ICD-10-CM | POA: Insufficient documentation

## 2021-09-28 DIAGNOSIS — R0789 Other chest pain: Secondary | ICD-10-CM | POA: Diagnosis not present

## 2021-09-28 DIAGNOSIS — R079 Chest pain, unspecified: Secondary | ICD-10-CM | POA: Insufficient documentation

## 2021-09-28 NOTE — ED Triage Notes (Signed)
Patient c/o having high BP all day and things he may be in afib. C/o some sob, chest tightness. Was seen for similar on 09/26/2021

## 2021-09-29 ENCOUNTER — Emergency Department (HOSPITAL_BASED_OUTPATIENT_CLINIC_OR_DEPARTMENT_OTHER): Payer: Medicare Other

## 2021-09-29 ENCOUNTER — Encounter: Payer: Self-pay | Admitting: Internal Medicine

## 2021-09-29 ENCOUNTER — Emergency Department (HOSPITAL_BASED_OUTPATIENT_CLINIC_OR_DEPARTMENT_OTHER)
Admission: EM | Admit: 2021-09-29 | Discharge: 2021-09-29 | Disposition: A | Discharge status: Home / Self Care | Payer: Medicare Other | Attending: Emergency Medicine | Admitting: Emergency Medicine

## 2021-09-29 DIAGNOSIS — J9811 Atelectasis: Secondary | ICD-10-CM | POA: Diagnosis not present

## 2021-09-29 DIAGNOSIS — R0602 Shortness of breath: Secondary | ICD-10-CM | POA: Diagnosis not present

## 2021-09-29 DIAGNOSIS — R0600 Dyspnea, unspecified: Secondary | ICD-10-CM | POA: Diagnosis not present

## 2021-09-29 DIAGNOSIS — I10 Essential (primary) hypertension: Secondary | ICD-10-CM | POA: Diagnosis not present

## 2021-09-29 DIAGNOSIS — R0789 Other chest pain: Secondary | ICD-10-CM | POA: Diagnosis not present

## 2021-09-29 DIAGNOSIS — R079 Chest pain, unspecified: Secondary | ICD-10-CM

## 2021-09-29 DIAGNOSIS — E876 Hypokalemia: Secondary | ICD-10-CM

## 2021-09-29 LAB — BASIC METABOLIC PANEL
Anion gap: 7 (ref 5–15)
BUN: 27 mg/dL — ABNORMAL HIGH (ref 8–23)
CO2: 31 mmol/L (ref 22–32)
Calcium: 9.1 mg/dL (ref 8.9–10.3)
Chloride: 102 mmol/L (ref 98–111)
Creatinine, Ser: 1.16 mg/dL (ref 0.61–1.24)
GFR, Estimated: >60 mL/min (ref >60)
Glucose, Bld: 94 mg/dL (ref 70–99)
Potassium: 3.3 mmol/L — ABNORMAL LOW (ref 3.5–5.1)
Sodium: 140 mmol/L (ref 135–145)

## 2021-09-29 LAB — CBC
HCT: 40.1 % (ref 39.0–52.0)
Hemoglobin: 13.6 g/dL (ref 13.0–17.0)
MCH: 30.5 pg (ref 26.0–34.0)
MCHC: 33.9 g/dL (ref 30.0–36.0)
MCV: 89.9 fL (ref 80.0–100.0)
Platelets: 144 10*3/uL — ABNORMAL LOW (ref 150–400)
RBC: 4.46 MIL/uL (ref 4.22–5.81)
RDW: 13.4 % (ref 11.5–15.5)
WBC: 8.1 10*3/uL (ref 4.0–10.5)
nRBC: 0 % (ref 0.0–0.2)

## 2021-09-29 LAB — TROPONIN I (HIGH SENSITIVITY)
Troponin I (High Sensitivity): 12 ng/L (ref <18)
Troponin I (High Sensitivity): 15 ng/L (ref <18)

## 2021-09-29 MED ORDER — IOHEXOL 350 MG/ML SOLN
100.0000 mL | Freq: Once | INTRAVENOUS | Status: AC | PRN
  Administered 2021-09-29: 100 mL via INTRAVENOUS

## 2021-09-29 MED ORDER — ALUM & MAG HYDROXIDE-SIMETH 200-200-20 MG/5ML PO SUSP
30.0000 mL | Freq: Once | ORAL | Status: AC
  Administered 2021-09-29: 30 mL via ORAL
  Filled 2021-09-29: qty 30

## 2021-09-29 MED ORDER — LIDOCAINE VISCOUS HCL 2 % MT SOLN
15.0000 mL | Freq: Once | OROMUCOSAL | Status: AC
  Administered 2021-09-29: 15 mL via ORAL
  Filled 2021-09-29: qty 15

## 2021-09-29 MED ORDER — SODIUM CHLORIDE 0.9 % IV BOLUS
1000.0000 mL | Freq: Once | INTRAVENOUS | Status: AC
  Administered 2021-09-29: 1000 mL via INTRAVENOUS

## 2021-09-29 MED ORDER — SUCRALFATE 1 G PO TABS: 1.0000 g | ORAL_TABLET | Freq: Three times a day (TID) | ORAL | 0 refills | Status: DC

## 2021-09-29 NOTE — ED Notes (Signed)
Pt verbalizes understanding of discharge instructions. Opportunity for questioning and answers were provided. Pt discharged from ED to home.   ? ?

## 2021-09-29 NOTE — ED Provider Notes (Signed)
[facility] EMERGENCY DEPT Provider Note   CSN: [id number] Arrival date & time: 09/28/21  2315     History  Chief Complaint  Patient presents with   Chest Pain    Brandon [name]. is a 71 y.o. male.  Patient as above with significant medical history as below, including hypertension, A-fib on DOAC, bilateral PE, sciatica, asthma, who presents to the ED with complaint of chest pain, elevated blood pressure.  Patient reports his blood pressures been running higher throughout the day, checks blood pressure at home and was around 932 systolic, 671 diastolic.  He had some transient chest pressure/heaviness around 9 PM this evening which resolved spontaneously after a few minutes of duration.  Is been having some mild dyspnea throughout the day, difficulty taking a deep inspiration.  Pain described as heaviness, pressure, tightness.  Mildly worsened with deep inspiration.  Chest pain has resolved but the dyspnea has persisted.  No nausea or vomiting, no syncope or near syncope, no palpitations.  He is compliant with his home medications.  No recent travel or sick contacts   Seen 9/22 secondary to abdominal pain, nausea and vomiting.  CTAP 9/22 was stable, chronic hypok, labs otherwise stable.   Echo 9/18 with LVEF 60 to 65%, G1 DD   Past Medical History:  Diagnosis Date   Anxiety    Arthritis    Benign neoplasm of colon    Benign prostatic hyperplasia with urinary obstruction    Bilateral inguinal hernia 05/29/2011   Bilateral pulmonary embolism ([place]) 3/14   admitted to [facility],  treated with Xarelto   Breast lump 03/29/2019   Calculus of kidney 10/21/2016   Cataract    Chronic bilateral low back pain with sciatica 03/21/2012   Chronic pain    Finger mass, right 10/17/2020   History of alcohol abuse 03/07/2016   History of colon polyps 11/18/2015   History of kidney stones    History of pulmonary embolism 03/19/2012   Hyperlipemia    Hypertension    Insomnia     Insomnia due to medical condition 12/23/2016   Polyuria secondary to BPH   Major depressive disorder, recurrent episode ([place])    Mitral regurgitation 04/24/2013   Mild by TEE   Morbid obesity ([place]) 03/07/2016   Nonrheumatic mitral valve insufficiency 04/25/2013   Mild by TEE  Overview:  Overview:  Mild by TEE Overview:  Overview:  Overview:  Mild by TEE Overview:  Overview:  Mild by TEE   Obesity    OSA (obstructive sleep apnea)    noncompliant with CPAP.  07/27/13- awaiting a CPAP- unable to tolerate mask   Osteoporosis    Persistent atrial fibrillation (HCC)    Recurrent unilateral inguinal hernia 07/17/2013   Restless leg syndrome    takes gabapentin   S/P right total knee arthroplasty 05/24/2018   Sciatica    Spinal stenosis, lumbar region with neurogenic claudication    SVT (supraventricular tachycardia) ([place]) 03/07/2016   Thrombus of left atrial appendage 02/09/5807   Uncomplicated asthma 9/83/3825   Overview:  Overview:  Overview:  Overview:  Qualifier: Diagnosis of  By: Sherren Mocha MD, Jory Ee Overview:  Overview:  Qualifier: Diagnosis of  By: Sherren Mocha MD, Jory Ee   Ventral hernia, unspecified, without mention of obstruction or gangrene    right abdominal wall    Past Surgical History:  Procedure Laterality Date   CARDIOVERSION N/A 03/21/2012   Procedure: CARDIOVERSION;  Surgeon: Birdie Riddle, MD;  [facility]: [facility];  Service: Cardiovascular;  Laterality: N/A;   CARDIOVERSION N/A 04/24/2013   Procedure: CARDIOVERSION;  Surgeon: Dorothy Spark, MD;  Location: [facility];  Service: Cardiovascular;  Laterality: N/A;   CARDIOVERSION N/A 06/27/2015   Procedure: CARDIOVERSION;  Surgeon: Larey Dresser, MD;  Location: [place];  Service: Cardiovascular;  Laterality: N/A;   CATARACT EXTRACTION     COLONOSCOPY W/ POLYPECTOMY     ELECTROPHYSIOLOGIC STUDY N/A 07/30/2015   Procedure: Atrial Fibrillation Ablation;  Surgeon: Thompson Grayer, MD;  Location: [facility];  Service:  Cardiovascular;  Laterality: N/A;   EXTRACORPOREAL SHOCK WAVE LITHOTRIPSY Right 10/29/2016   Procedure: RIGHT EXTRACORPOREAL SHOCK WAVE LITHOTRIPSY (ESWL);  Surgeon: Alexis Frock, MD;  Location: [facility];  Service: Urology;  Laterality: Right;   EYE SURGERY Right    cataract   FEMUR FRACTURE SURGERY     HERNIA REPAIR  07/06/2011   INGUINAL HERNIA REPAIR Right 07/28/2013   Procedure: RIGHT INGUINAL HERNIA REPAIR;  Surgeon: Imogene Burn. Georgette Dover, MD;  Location: [facility];  Service: General;  Laterality: Right;   INGUINAL HERNIA REPAIR Right 09/22/2016   Procedure: LAPAROSCOPIC RIGHT INGUINAL HERNIA;  Surgeon: Kieth Brightly Arta Bruce, MD;  Location: [facility];  Service: General;  Laterality: Right;  With MESH   INSERTION OF MESH Right 07/28/2013   Procedure: INSERTION OF MESH;  Surgeon: Imogene Burn. Georgette Dover, MD;  Location: [facility];  Service: General;  Laterality: Right;   KNEE ARTHROSCOPY     left   REPLACEMENT TOTAL KNEE Left    ROTATOR CUFF REPAIR     right   ROTATOR CUFF REPAIR Left 03/08/2014   DR SUPPLE   SHOULDER ARTHROSCOPY WITH ROTATOR CUFF REPAIR AND SUBACROMIAL DECOMPRESSION Left 03/08/2014   Procedure: LEFT SHOULDER ARTHROSCOPY WITH ROTATOR CUFF REPAIR/SUBACROMIAL DECOMPRESSION/DISTAL CLAVICLE RESECTION;  Surgeon: Marin Shutter, MD;  Location: [facility];  Service: Orthopedics;  Laterality: Left;   SVT ABLATION N/A 01/23/2019   Procedure: SVT ABLATION;  Surgeon: Evans Lance, MD;  Location: [facility];  Service: Cardiovascular;  Laterality: N/A;   TEE WITHOUT CARDIOVERSION N/A 03/21/2012   Procedure: TRANSESOPHAGEAL ECHOCARDIOGRAM (TEE);  Surgeon: Birdie Riddle, MD;  Location: [place];  Service: Cardiovascular;  Laterality: N/A;   TEE WITHOUT CARDIOVERSION N/A 04/24/2013   Procedure: TRANSESOPHAGEAL ECHOCARDIOGRAM (TEE);  Surgeon: Dorothy Spark, MD;  Location: [facility];  Service: Cardiovascular;  Laterality: N/A;   TEE WITHOUT CARDIOVERSION N/A 07/29/2015   Procedure: TRANSESOPHAGEAL  ECHOCARDIOGRAM (TEE);  Surgeon: Fay Records, MD;  Location: [place];  Service: Cardiovascular;  Laterality: N/A;   TONSILLECTOMY     TOTAL KNEE ARTHROPLASTY Right 05/24/2018   Procedure: RIGHT TOTAL KNEE ARTHROPLASTY;  Surgeon: Paralee Cancel, MD;  Location: [facility];  Service: Orthopedics;  Laterality: Right;  70 mins     The history is provided by the patient. No language interpreter was used.  Chest Pain Associated symptoms: shortness of breath   Associated symptoms: no abdominal pain, no cough, no dysphagia, no fever, no headache, no nausea, no palpitations and no vomiting        Home Medications Prior to Admission medications   Medication Sig Start Date End Date Taking? Authorizing Provider  sucralfate (CARAFATE) 1 g tablet Take 1 tablet (1 g total) by mouth with breakfast, with lunch, and with evening meal for 7 days. 09/29/21 10/06/21 Yes Wynona Dove A, DO  amLODipine (NORVASC) 5 MG tablet TAKE 1 TABLET BY MOUTH EVERY DAY Patient taking differently: Take 5 mg by mouth  every evening. 06/19/20   Mullis, Kiersten P, DO  atorvastatin (LIPITOR) 40 MG tablet Take 1 tablet (40 mg total) by mouth daily. 08/18/20   Martyn Malay, MD  carvedilol (COREG) 25 MG tablet Take 1 tablet (25 mg total) by mouth 2 (two) times daily with a meal. 04/22/21   Evans Lance, MD  dutasteride (AVODART) 0.5 MG capsule Take 0.5 mg by mouth daily. 03/27/21   [provider]  ELIQUIS 5 MG TABS tablet TAKE 1 TABLET BY MOUTH TWICE A DAY Patient taking differently: Take 5 mg by mouth 2 (two) times daily. 04/23/21   Evans Lance, MD  escitalopram (LEXAPRO) 20 MG tablet Take 1 tablet (20 mg total) by mouth daily. 08/25/21   [name], MD  fluticasone (FLONASE) 50 MCG/ACT nasal spray Place 2 sprays into both nostrils daily. Patient taking differently: Place 2 sprays into both nostrils daily as needed for allergies. 02/03/21   [name], MD  gabapentin (NEURONTIN) 800 MG tablet Take 1 tablet (800  mg total) by mouth 3 (three) times daily. 09/23/21   [name], MD  irbesartan (AVAPRO) 300 MG tablet Take 1 tablet (300 mg total) by mouth daily. 02/26/20   Evans Lance, MD  lubiprostone (AMITIZA) 24 MCG capsule TAKE 1 CAPSULE (24 MCG TOTAL) BY MOUTH 2 (TWO) TIMES DAILY WITH A MEAL. 09/02/21   [name], MD  Magnesium Gluconate 550 MG TABS TAKE 1 TABLET BY MOUTH 2 TIMES DAILY. 06/20/21   Hoyt Koch, MD  memantine (NAMENDA) 5 MG tablet TAKE 1 TABLET BY MOUTH TWICE A DAY 08/01/21   [name], MD  MYRBETRIQ 25 MG TB24 tablet Take 25 mg by mouth daily. 02/27/21   [provider]  ondansetron (ZOFRAN) 4 MG tablet Take 1 tablet (4 mg total) by mouth every 6 (six) hours. 09/26/21   Tretha Sciara, MD  potassium chloride SA (KLOR-CON M) 20 MEQ tablet Take 1 tablet (20 mEq total) by mouth 2 (two) times daily for 10 days. 09/26/21 10/06/21  Tretha Sciara, MD  spironolactone (ALDACTONE) 25 MG tablet Take 1 tablet (25 mg total) by mouth daily. 08/02/21   [name], MD      Allergies    Ace inhibitors and Albuterol    Review of Systems   Review of Systems  Constitutional:  Negative for chills and fever.  HENT:  Negative for facial swelling and trouble swallowing.   Eyes:  Negative for photophobia and visual disturbance.  Respiratory:  Positive for shortness of breath. Negative for cough.   Cardiovascular:  Positive for chest pain. Negative for palpitations.  Gastrointestinal:  Negative for abdominal pain, nausea and vomiting.  Endocrine: Negative for polydipsia and polyuria.  Genitourinary:  Negative for difficulty urinating and hematuria.  Musculoskeletal:  Negative for gait problem and joint swelling.  Skin:  Negative for pallor and rash.  Neurological:  Negative for syncope and headaches.  Psychiatric/Behavioral:  Negative for agitation and confusion.     Physical Exam Updated Vital Signs BP 110/70   Pulse 74   Temp 98.2 F (36.8 C) (Oral)    Resp 18   Ht '6\' 4"'$  (1.93 m)   Wt 129.3 kg   SpO2 96%   BMI 34.69 kg/m  Physical Exam Vitals and nursing note reviewed.  Constitutional:      General: He is not in acute distress.    Appearance: Normal appearance. He is well-developed. He is not ill-appearing, toxic-appearing or diaphoretic.  HENT:     Head: Normocephalic and atraumatic.     Right Ear: External ear normal.     Left Ear: External ear normal.     Mouth/Throat:     Mouth: Mucous membranes are moist.  Eyes:     General: No scleral icterus. Cardiovascular:     Rate and Rhythm: Normal rate. Rhythm irregular.     Pulses: Normal pulses.     Heart sounds: Normal heart sounds.  Pulmonary:     Effort: Pulmonary effort is normal. No respiratory distress.     Breath sounds: Normal breath sounds. No wheezing.  Abdominal:     General: Abdomen is flat.     Palpations: Abdomen is soft.     Tenderness: There is no abdominal tenderness. There is no guarding or rebound.  Musculoskeletal:        General: Normal range of motion.     Cervical back: Normal range of motion.     Right lower leg: No edema.     Left lower leg: No edema.  Skin:    General: Skin is warm and dry.     Capillary Refill: Capillary refill takes less than 2 seconds.  Neurological:     Mental Status: He is alert and oriented to person, place, and time.     GCS: GCS eye subscore is 4. GCS verbal subscore is 5. GCS motor subscore is 6.  Psychiatric:        Mood and Affect: Mood normal.        Behavior: Behavior normal.     ED Results / Procedures / Treatments   Labs (all labs ordered are listed, but only abnormal results are displayed) Labs Reviewed  BASIC METABOLIC PANEL - Abnormal; Notable for the following components:      Result Value   Potassium 3.3 (*)    BUN 27 (*)    All other components within normal limits  CBC - Abnormal; Notable for the following components:   Platelets 144 (*)    All other components within normal limits  TROPONIN I  (HIGH SENSITIVITY)  TROPONIN I (HIGH SENSITIVITY)    EKG EKG Interpretation  Date/Time:  Sunday September 28 2021 23:30:44 EDT Ventricular Rate:  72 PR Interval:    QRS Duration: 98 QT Interval:  376 QTC Calculation: 411 R Axis:   -6 Text Interpretation: Atrial fibrillation with premature ventricular or aberrantly conducted complexes Abnormal ECG When compared with ECG of 26-Sep-2021 17:37, QRS axis Shifted right QT has shortened Similar to prior, no STEMI Confirmed by Wynona Dove ([id number]) on 09/29/2021 12:33:11 AM  Radiology CT Angio Chest PE W and/or Wo Contrast  Result Date: 09/29/2021 CLINICAL DATA:  Hypertension, shortness of breath. Pulmonary embolism (PE) suspected, unknown D-dimer EXAM: CT ANGIOGRAPHY CHEST WITH CONTRAST TECHNIQUE: Multidetector CT imaging of the chest was performed using the standard protocol during bolus administration of intravenous contrast. Multiplanar CT image reconstructions and MIPs were obtained to evaluate the vascular anatomy. RADIATION DOSE REDUCTION: This exam was performed according to the departmental dose-optimization program which includes automated exposure control, adjustment of the mA and/or kV according to patient size and/or use of iterative reconstruction technique. CONTRAST:  158m OMNIPAQUE IOHEXOL 350 MG/ML SOLN COMPARISON:  05/03/2013 FINDINGS: Cardiovascular: No filling defects in the pulmonary arteries to suggest pulmonary emboli. Heart is upper limits normal in size. Coronary artery and aortic atherosclerosis. No aneurysm. Mediastinum/Nodes: No mediastinal, hilar, or axillary adenopathy. Trachea and esophagus are unremarkable. Thyroid unremarkable. Lungs/Pleura: Linear areas of atelectasis  or scarring in the lung bases. No confluent opacities. No effusions. Upper Abdomen: No acute findings Musculoskeletal: Chest wall soft tissues are unremarkable. No acute bony abnormality. Review of the MIP images confirms the above findings. IMPRESSION: No  evidence of pulmonary embolus. Coronary artery disease. Bibasilar atelectasis or scarring. Aortic Atherosclerosis (ICD10-I70.0). Electronically Signed   By: Rolm Baptise M.D.   On: 09/29/2021 01:37   DG Chest Port 1 View  Result Date: 09/29/2021 CLINICAL DATA:  Dyspnea and chest tightness; hypertension EXAM: PORTABLE CHEST 1 VIEW COMPARISON:  Radiographs 09/24/2021 FINDINGS: Stable cardiomediastinal silhouette. Left basilar atelectasis. The right costophrenic angle is not included in the image. No large pleural effusion or pneumothorax. No acute osseous abnormality. IMPRESSION: No active disease.  Left base atelectasis. Electronically Signed   By: Placido Sou M.D.   On: 09/29/2021 00:42    Procedures Procedures    Medications Ordered in ED Medications  sodium chloride 0.9 % bolus 1,000 mL (0 mLs Intravenous Stopped 09/29/21 0304)  alum & mag hydroxide-simeth (MAALOX/MYLANTA) 200-200-20 MG/5ML suspension 30 mL (30 mLs Oral Given 09/29/21 0131)    And  lidocaine (XYLOCAINE) 2 % viscous mouth solution 15 mL (15 mLs Oral Given 09/29/21 0131)  iohexol (OMNIPAQUE) 350 MG/ML injection 100 mL (100 mLs Intravenous Contrast Given 09/29/21 0121)    ED Course/ Medical Decision Making/ A&P                           Medical Decision Making Amount and/or Complexity of Data Reviewed Labs: ordered. Radiology: ordered.  Risk OTC drugs. Prescription drug management.   This patient presents to the ED with chief complaint(s) of chest pain, dyspnea, low blood pressure with pertinent past medical history of CAD, hypertension, PE which further complicates the presenting complaint. The complaint involves an extensive differential diagnosis and also carries with it a high risk of complications and morbidity.    Differential includes all life-threatening causes for chest pain. This includes but is not exclusive to acute coronary syndrome, aortic dissection, pulmonary embolism, cardiac tamponade,  community-acquired pneumonia, pericarditis, musculoskeletal chest wall pain, etc.  In my evaluation of this patient's dyspnea my DDx includes, but is not limited to, pneumonia, pulmonary embolism, pneumothorax, pulmonary edema, metabolic acidosis, asthma, COPD, cardiac cause, anemia, anxiety, etc.    . Serious etiologies were considered.   The initial plan is to screening labs, IV fluids,   Additional history obtained: Additional history obtained from  not applicable Records reviewed  prior ED visits, prior primary care notes, prior echo, prior labs and imaging Seen 9/22 secondary to abdominal pain, nausea and vomiting.  CTAP 9/22 was stable, chronic hypok, labs otherwise stable.   Echo 9/18 with LVEF 60 to 65%, G1 DD  Independent labs interpretation:  The following labs were independently interpreted:  Mild hypokalemia CBC was stable   Independent visualization of imaging: - I independently visualized the following imaging with scope of interpretation limited to determining acute life threatening conditions related to emergency care: Chest x-ray, CT PE, which revealed CAD, atelectasis, otherwise no acute abnormalities  Moderate risk Wells score, obtain CT PE  Cardiac monitoring was reviewed and interpreted by myself which shows afib (known hx of this, on doac)  Treatment and Reassessment: IVF GI cocktail >> Symptoms have resolved  Consultation: - Consulted or discussed management/test interpretation w/ external professional: na  Consideration for admission or further workup: Admission was considered   71 year old male to the ED today secondary to  chest pressure, elevated blood pressure.  Hemodynamically stable.  Labs reviewed and are stable.  Troponin negative x2.  ECG without acute ischemic changes.  Imaging reviewed and stable.  No ongoing chest pain.  He has moderate risk heart score given his age, comorbidities (score 4).  Troponins were negative, no ongoing chest pain.   Symptoms resolved w/ GI Cocktail. This chest pain is likely secondary to indigestion or atypical source however given his elevated heart score would recommend patient follow-up with cardiology.  Ambulatory referral has been placed. (Dr Carleene Overlie)  Rec'd him to continue his home medications.   The patient improved significantly and was discharged in stable condition. Detailed discussions were had with the patient regarding current findings, and need for close f/u with PCP or on call doctor. The patient has been instructed to return immediately if the symptoms worsen in any way for re-evaluation. Patient verbalized understanding and is in agreement with current care plan. All questions answered prior to discharge.    Social Determinants of health: Social History   Tobacco Use   Smoking status: Former    Packs/day: 0.50    Years: 5.00    Total pack years: 2.50    Types: Cigarettes    Quit date: 1976    Years since quitting: 47.7   Smokeless tobacco: Never  Vaping Use   Vaping Use: Never used  Substance Use Topics   Alcohol use: No    Comment: Former EtOH abuse, stopped 06/2016   Drug use: No    Comment: negative hx for IV drug abuse            Final Clinical Impression(s) / ED Diagnoses Final diagnoses:  Moderate risk chest pain  Hypokalemia    Rx / DC Orders ED Discharge Orders          Ordered    Ambulatory referral to Cardiology        09/29/21 0410    sucralfate (CARAFATE) 1 g tablet  3 times daily with meals        09/29/21 0411              Jeanell Sparrow, DO 09/29/21 [phone]

## 2021-09-29 NOTE — Discharge Instructions (Addendum)
It was a pleasure caring for you today in the emergency department.  Please return to the emergency department for any worsening or worrisome symptoms.  Please follow up with your cardiologist. Anticipate a phone call in the next few days to set up an appointment

## 2021-09-30 ENCOUNTER — Telehealth: Payer: Self-pay

## 2021-09-30 ENCOUNTER — Encounter (HOSPITAL_COMMUNITY): Payer: Self-pay | Admitting: *Deleted

## 2021-09-30 ENCOUNTER — Ambulatory Visit (HOSPITAL_COMMUNITY)
Admission: EM | Admit: 2021-09-30 | Discharge: 2021-09-30 | Disposition: A | Discharge status: Home / Self Care | Payer: Medicare Other | Attending: Emergency Medicine | Admitting: Emergency Medicine

## 2021-09-30 DIAGNOSIS — R030 Elevated blood-pressure reading, without diagnosis of hypertension: Secondary | ICD-10-CM | POA: Diagnosis not present

## 2021-09-30 LAB — BASIC METABOLIC PANEL
Anion gap: 8 (ref 5–15)
BUN: 10 mg/dL (ref 8–23)
CO2: 32 mmol/L (ref 22–32)
Calcium: 9.2 mg/dL (ref 8.9–10.3)
Chloride: 102 mmol/L (ref 98–111)
Creatinine, Ser: 0.82 mg/dL (ref 0.61–1.24)
GFR, Estimated: >60 mL/min (ref >60)
Glucose, Bld: 96 mg/dL (ref 70–99)
Potassium: 3.4 mmol/L — ABNORMAL LOW (ref 3.5–5.1)
Sodium: 142 mmol/L (ref 135–145)

## 2021-09-30 NOTE — Telephone Encounter (Signed)
Noted. Will wait for triage report.

## 2021-09-30 NOTE — Discharge Instructions (Signed)
Here today in the office your blood pressure was 134/86 which is normal and well controlled  Please only check your blood pressure every morning and every evening, when blood pressures are typically checked more than once or twice a day it can cause anxiety and will keep your blood pressure elevated, record numbers and take to follow-up doctors appointment  We have obtained blood work to recheck your potassium level, you will be notified of any concerning values and told how to move forward  Continue to take all daily medication as directed  You may follow-up with urgent care as needed  At any point if your blood pressure is elevated and that you are experiencing a severe headache, visual changes, dizziness, lightheadedness, chest pain or shortness of breath please go to the nearest emergency department for evaluation

## 2021-09-30 NOTE — ED Provider Notes (Signed)
[facility]    CSN: [id number] Arrival date & time: 09/30/21  1026      History   Chief Complaint Chief Complaint  Patient presents with   Hypertension    HPI Brandon [name]. is a 71 y.o. male.   Patient presents for evaluation after blood pressure reading this morning at approximately 9 to 10 AM was 169/124.  Endorses that he has been checking blood pressure hourly, generally had been checking every 1 to 2 weeks prior to elevation over the last few days.  Endorses that 4 to 5 days ago he began to have nausea and vomiting with general malaise and was evaluated in the emergency department where he was noted to be hypokalemic, currently taking potassium.  Vomiting and dry heaving have resolved.  Has been able to tolerate food and liquids.  Was evaluated in the ED 1 day ago for chest pain where he received a complete work-up.  Has been taking all daily medication as prescribed.  History of paroxysmal atrial fibrillation, hypertension, SVT, general anxiety disorder, PE, use of daily coagulation.  Denies dizziness, lightheadedness, syncope, visual changes, chest pain, shortness of breath.  Endorses that he did reach out to his cardiologist but they are currently out of town.  Past Medical History:  Diagnosis Date   Anxiety    Arthritis    Benign neoplasm of colon    Benign prostatic hyperplasia with urinary obstruction    Bilateral inguinal hernia 05/29/2011   Bilateral pulmonary embolism ([place]) 3/14   admitted to [facility],  treated with Xarelto   Breast lump 03/29/2019   Calculus of kidney 10/21/2016   Cataract    Chronic bilateral low back pain with sciatica 03/21/2012   Chronic pain    Finger mass, right 10/17/2020   History of alcohol abuse 03/07/2016   History of colon polyps 11/18/2015   History of kidney stones    History of pulmonary embolism 03/19/2012   Hyperlipemia    Hypertension    Insomnia    Insomnia due to medical condition 12/23/2016   Polyuria secondary  to BPH   Major depressive disorder, recurrent episode ([place])    Mitral regurgitation 04/24/2013   Mild by TEE   Morbid obesity ([place]) 03/07/2016   Nonrheumatic mitral valve insufficiency 04/25/2013   Mild by TEE  Overview:  Overview:  Mild by TEE Overview:  Overview:  Overview:  Mild by TEE Overview:  Overview:  Mild by TEE   Obesity    OSA (obstructive sleep apnea)    noncompliant with CPAP.  07/27/13- awaiting a CPAP- unable to tolerate mask   Osteoporosis    Persistent atrial fibrillation (HCC)    Recurrent unilateral inguinal hernia 07/17/2013   Restless leg syndrome    takes gabapentin   S/P right total knee arthroplasty 05/24/2018   Sciatica    Spinal stenosis, lumbar region with neurogenic claudication    SVT (supraventricular tachycardia) ([place]) 03/07/2016   Thrombus of left atrial appendage 02/14/4763   Uncomplicated asthma 4/65/0354   Overview:  Overview:  Overview:  Overview:  Qualifier: Diagnosis of  By: Sherren Mocha MD, Jory Ee Overview:  Overview:  Qualifier: Diagnosis of  By: Sherren Mocha MD, Jory Ee   Ventral hernia, unspecified, without mention of obstruction or gangrene    right abdominal wall    Patient Active Problem List   Diagnosis Date Noted   Hyperaldosteronism ([place]) 08/02/2021   Tuberculosis screening 07/24/2021   Chronic hyperglycemia 07/09/2021   Restless leg syndrome 07/09/2021  Primary osteoarthritis of left hip 06/20/2021   Chronic idiopathic constipation 06/10/2021   Long-term current use of opiate analgesic 06/10/2021   Hyperlipidemia LDL goal <130 02/03/2021   Seasonal allergic rhinitis due to pollen 02/03/2021   Chronic hypokalemia 02/03/2021   Morbid obesity ([place]) 12/24/2020   Spinal stenosis, lumbar region with neurogenic claudication    SVT (supraventricular tachycardia) ([place]) 01/21/2019   BPH (benign prostatic hyperplasia) 10/21/2016   Essential hypertension 10/21/2016   Depression, major, single episode, complete remission ([place]) 10/21/2016   Paroxysmal  atrial fibrillation (HCC)    Restless legs syndrome 02/08/2014   Generalized anxiety disorder 12/12/2013   Current use of long term anticoagulation    Obstructive sleep apnea 03/09/2013   History of pulmonary embolism 03/19/2012    Past Surgical History:  Procedure Laterality Date   CARDIOVERSION N/A 03/21/2012   Procedure: CARDIOVERSION;  Surgeon: Birdie Riddle, MD;  Location: [place];  Service: Cardiovascular;  Laterality: N/A;   CARDIOVERSION N/A 04/24/2013   Procedure: CARDIOVERSION;  Surgeon: Dorothy Spark, MD;  Location: [place];  Service: Cardiovascular;  Laterality: N/A;   CARDIOVERSION N/A 06/27/2015   Procedure: CARDIOVERSION;  Surgeon: Larey Dresser, MD;  Location: [place];  Service: Cardiovascular;  Laterality: N/A;   CATARACT EXTRACTION     COLONOSCOPY W/ POLYPECTOMY     ELECTROPHYSIOLOGIC STUDY N/A 07/30/2015   Procedure: Atrial Fibrillation Ablation;  Surgeon: Thompson Grayer, MD;  Location: [place] [facility];  Service: Cardiovascular;  Laterality: N/A;   EXTRACORPOREAL SHOCK WAVE LITHOTRIPSY Right 10/29/2016   Procedure: RIGHT EXTRACORPOREAL SHOCK WAVE LITHOTRIPSY (ESWL);  Surgeon: Alexis Frock, MD;  [facility]: [facility];  Service: Urology;  Laterality: Right;   EYE SURGERY Right    cataract   FEMUR FRACTURE SURGERY     HERNIA REPAIR  07/06/2011   INGUINAL HERNIA REPAIR Right 07/28/2013   Procedure: RIGHT INGUINAL HERNIA REPAIR;  Surgeon: Imogene Burn. Georgette Dover, MD;  Location: [place];  Service: General;  Laterality: Right;   INGUINAL HERNIA REPAIR Right 09/22/2016   Procedure: LAPAROSCOPIC RIGHT INGUINAL HERNIA;  Surgeon: Kieth Brightly Arta Bruce, MD;  [facility];  Service: General;  Laterality: Right;  With MESH   INSERTION OF MESH Right 07/28/2013   Procedure: INSERTION OF MESH;  Surgeon: Imogene Burn. Georgette Dover, MD;  Location: [place];  Service: General;  Laterality: Right;   KNEE ARTHROSCOPY     left   REPLACEMENT TOTAL KNEE Left    ROTATOR CUFF REPAIR     right    ROTATOR CUFF REPAIR Left 03/08/2014   DR SUPPLE   SHOULDER ARTHROSCOPY WITH ROTATOR CUFF REPAIR AND SUBACROMIAL DECOMPRESSION Left 03/08/2014   Procedure: LEFT SHOULDER ARTHROSCOPY WITH ROTATOR CUFF REPAIR/SUBACROMIAL DECOMPRESSION/DISTAL CLAVICLE RESECTION;  Surgeon: Marin Shutter, MD;  Location: [place];  Service: Orthopedics;  Laterality: Left;   SVT ABLATION N/A 01/23/2019   Procedure: SVT ABLATION;  Surgeon: Evans Lance, MD;  Location: [place] [facility];  Service: Cardiovascular;  Laterality: N/A;   TEE WITHOUT CARDIOVERSION N/A 03/21/2012   Procedure: TRANSESOPHAGEAL ECHOCARDIOGRAM (TEE);  Surgeon: Birdie Riddle, MD;  Location: [place];  Service: Cardiovascular;  Laterality: N/A;   TEE WITHOUT CARDIOVERSION N/A 04/24/2013   Procedure: TRANSESOPHAGEAL ECHOCARDIOGRAM (TEE);  Surgeon: Dorothy Spark, MD;  Location: [place];  Service: Cardiovascular;  Laterality: N/A;   TEE WITHOUT CARDIOVERSION N/A 07/29/2015   Procedure: TRANSESOPHAGEAL ECHOCARDIOGRAM (TEE);  Surgeon: Fay Records, MD;  Location: [place];  Service: Cardiovascular;  Laterality: N/A;  TONSILLECTOMY     TOTAL KNEE ARTHROPLASTY Right 05/24/2018   Procedure: RIGHT TOTAL KNEE ARTHROPLASTY;  Surgeon: Paralee Cancel, MD;  [facility];  Service: Orthopedics;  Laterality: Right;  70 mins       Home Medications    Prior to Admission medications   Medication Sig Start Date End Date Taking? Authorizing Provider  amLODipine (NORVASC) 5 MG tablet TAKE 1 TABLET BY MOUTH EVERY DAY Patient taking differently: Take 5 mg by mouth every evening. 06/19/20  Yes Mullis, Kiersten P, DO  atorvastatin (LIPITOR) 40 MG tablet Take 1 tablet (40 mg total) by mouth daily. 08/18/20  Yes Martyn Malay, MD  carvedilol (COREG) 25 MG tablet Take 1 tablet (25 mg total) by mouth 2 (two) times daily with a meal. 04/22/21  Yes Evans Lance, MD  dutasteride (AVODART) 0.5 MG capsule Take 0.5 mg by mouth daily. 03/27/21  Yes [provider]  ELIQUIS 5 MG TABS tablet TAKE 1 TABLET BY MOUTH TWICE A DAY Patient taking differently: Take 5 mg by mouth 2 (two) times daily. 04/23/21  Yes Evans Lance, MD  escitalopram (LEXAPRO) 20 MG tablet Take 1 tablet (20 mg total) by mouth daily. 08/25/21  Yes [name], MD  fluticasone (FLONASE) 50 MCG/ACT nasal spray Place 2 sprays into both nostrils daily. Patient taking differently: Place 2 sprays into both nostrils daily as needed for allergies. 02/03/21  Yes [name], MD  gabapentin (NEURONTIN) 800 MG tablet Take 1 tablet (800 mg total) by mouth 3 (three) times daily. 09/23/21  Yes [name], MD  irbesartan (AVAPRO) 300 MG tablet Take 1 tablet (300 mg total) by mouth daily. 02/26/20  Yes Evans Lance, MD  lubiprostone (AMITIZA) 24 MCG capsule TAKE 1 CAPSULE (24 MCG TOTAL) BY MOUTH 2 (TWO) TIMES DAILY WITH A MEAL. 09/02/21  Yes [name], MD  Magnesium Gluconate 550 MG TABS TAKE 1 TABLET BY MOUTH 2 TIMES DAILY. 06/20/21  Yes Hoyt Koch, MD  memantine (NAMENDA) 5 MG tablet TAKE 1 TABLET BY MOUTH TWICE A DAY 08/01/21  Yes [name], MD  MYRBETRIQ 25 MG TB24 tablet Take 25 mg by mouth daily. 02/27/21  Yes [provider]  ondansetron (ZOFRAN) 4 MG tablet Take 1 tablet (4 mg total) by mouth every 6 (six) hours. 09/26/21  Yes Tretha Sciara, MD  potassium chloride SA (KLOR-CON M) 20 MEQ tablet Take 1 tablet (20 mEq total) by mouth 2 (two) times daily for 10 days. 09/26/21 10/06/21 Yes Tretha Sciara, MD  spironolactone (ALDACTONE) 25 MG tablet Take 1 tablet (25 mg total) by mouth daily. 08/02/21  Yes [name], MD  sucralfate (CARAFATE) 1 g tablet Take 1 tablet (1 g total) by mouth with breakfast, with lunch, and with evening meal for 7 days. 09/29/21 10/06/21 Yes Jeanell Sparrow, DO    Family History Family History  Problem Relation Age of Onset   Cancer Father        bone   Heart failure Mother    Hypertension Mother    Asthma  Mother    Cancer Paternal Grandmother        ovarian   CVA Other        Fam Hx of multiple myeloma   Diabetes Other        Fam Hx of DM    Social History Social History   Tobacco Use   Smoking status: Former    Packs/day: 0.50  Years: 5.00    Total pack years: 2.50    Types: Cigarettes    Quit date: 52    Years since quitting: 47.7   Smokeless tobacco: Never  Vaping Use   Vaping Use: Never used  Substance Use Topics   Alcohol use: No    Comment: Former EtOH abuse, stopped 06/2016   Drug use: No    Comment: negative hx for IV drug abuse     Allergies   Ace inhibitors and Albuterol   Review of Systems Review of Systems Defer to HPI   Physical Exam Triage Vital Signs ED Triage Vitals  Enc Vitals Group     BP 09/30/21 1108 134/86     Pulse Rate 09/30/21 1108 75     Resp 09/30/21 1108 18     Temp 09/30/21 1108 98.5 F (36.9 C)     Temp Source 09/30/21 1108 Oral     SpO2 09/30/21 1108 94 %     Weight --      Height --      Head Circumference --      Peak Flow --      Pain Score 09/30/21 1106 0     Pain Loc --      Pain Edu? --      Excl. in [place]? --    No data found.  Updated Vital Signs BP 134/86 (BP Location: Left Arm)   Pulse 75   Temp 98.5 F (36.9 C) (Oral)   Resp 18   SpO2 94%   Visual Acuity Right Eye Distance:   Left Eye Distance:   Bilateral Distance:    Right Eye Near:   Left Eye Near:    Bilateral Near:     Physical Exam Constitutional:      Appearance: Normal appearance.  HENT:     Head: Normocephalic.  Eyes:     Extraocular Movements: Extraocular movements intact.  Cardiovascular:     Rate and Rhythm: Normal rate. Rhythm irregular.     Pulses: Normal pulses.     Heart sounds: Normal heart sounds.  Pulmonary:     Effort: Pulmonary effort is normal.     Breath sounds: Normal breath sounds.  Musculoskeletal:     Cervical back: Normal range of motion and neck supple.  Skin:    General: Skin is warm and dry.   Neurological:     Mental Status: He is alert and oriented to person, place, and time. Mental status is at baseline.      UC Treatments / Results  Labs (all labs ordered are listed, but only abnormal results are displayed) Labs Reviewed - No data to display  EKG   Radiology CT Angio Chest PE W and/or Wo Contrast  Result Date: 09/29/2021 CLINICAL DATA:  Hypertension, shortness of breath. Pulmonary embolism (PE) suspected, unknown D-dimer EXAM: CT ANGIOGRAPHY CHEST WITH CONTRAST TECHNIQUE: Multidetector CT imaging of the chest was performed using the standard protocol during bolus administration of intravenous contrast. Multiplanar CT image reconstructions and MIPs were obtained to evaluate the vascular anatomy. RADIATION DOSE REDUCTION: This exam was performed according to the departmental dose-optimization program which includes automated exposure control, adjustment of the mA and/or kV according to patient size and/or use of iterative reconstruction technique. CONTRAST:  188m OMNIPAQUE IOHEXOL 350 MG/ML SOLN COMPARISON:  05/03/2013 FINDINGS: Cardiovascular: No filling defects in the pulmonary arteries to suggest pulmonary emboli. Heart is upper limits normal in size. Coronary artery and aortic atherosclerosis. No aneurysm. Mediastinum/Nodes: No mediastinal,  hilar, or axillary adenopathy. Trachea and esophagus are unremarkable. Thyroid unremarkable. Lungs/Pleura: Linear areas of atelectasis or scarring in the lung bases. No confluent opacities. No effusions. Upper Abdomen: No acute findings Musculoskeletal: Chest wall soft tissues are unremarkable. No acute bony abnormality. Review of the MIP images confirms the above findings. IMPRESSION: No evidence of pulmonary embolus. Coronary artery disease. Bibasilar atelectasis or scarring. Aortic Atherosclerosis (ICD10-I70.0). Electronically Signed   By: Rolm Baptise M.D.   On: 09/29/2021 01:37   DG Chest Port 1 View  Result Date: 09/29/2021 CLINICAL  DATA:  Dyspnea and chest tightness; hypertension EXAM: PORTABLE CHEST 1 VIEW COMPARISON:  Radiographs 09/24/2021 FINDINGS: Stable cardiomediastinal silhouette. Left basilar atelectasis. The right costophrenic angle is not included in the image. No large pleural effusion or pneumothorax. No acute osseous abnormality. IMPRESSION: No active disease.  Left base atelectasis. Electronically Signed   By: Placido Sou M.D.   On: 09/29/2021 00:42    Procedures Procedures (including critical care time)  Medications Ordered in UC Medications - No data to display  Initial Impression / Assessment and Plan / UC Course  I have reviewed the triage vital signs and the nursing notes.  Pertinent labs & imaging results that were available during my care of the patient were reviewed by me and considered in my medical decision making (see chart for details).  Elevated blood pressure reading  Blood pressure in triage is 134/86, stable, patient is in no signs of distress nor toxic appearing, to auscultation A-fib is noted with a well-controlled rate, reviewed lab work and diagnostics from 1 day prior, will repeat BMP to reevaluate potassium, last known value 3.3 1-day prior, no signs of hypertensive urgency at this time, discussed with patient to reduce blood pressure monitoring as frequent checks can cause anxiety, patient is stable at this time for home monitoring and recommended follow-up with his cardiologist when they return Final Clinical Impressions(s) / UC Diagnoses   Final diagnoses:  None   Discharge Instructions   None    ED Prescriptions   None    PDMP not reviewed this encounter.   Hans Eden, [place] 09/30/21 [phone]

## 2021-09-30 NOTE — ED Triage Notes (Signed)
Pt was seen at [facility] yesterday and is taking meds they gave him. He is here today since his PCP and the cardiologist is out of town. He says he is worried about kidneys since BP has been elevated X3 days. He has been vomiting for 3 days and complains of stomach discomfort from that.

## 2021-09-30 NOTE — Telephone Encounter (Signed)
Patient called in stating that his blood pressure is 173/122 with a headache. Had patient speak access nurse.

## 2021-10-01 DIAGNOSIS — L570 Actinic keratosis: Secondary | ICD-10-CM | POA: Diagnosis not present

## 2021-10-01 DIAGNOSIS — R21 Rash and other nonspecific skin eruption: Secondary | ICD-10-CM | POA: Diagnosis not present

## 2021-10-01 DIAGNOSIS — Q825 Congenital non-neoplastic nevus: Secondary | ICD-10-CM | POA: Diagnosis not present

## 2021-10-01 DIAGNOSIS — L821 Other seborrheic keratosis: Secondary | ICD-10-CM | POA: Diagnosis not present

## 2021-10-01 DIAGNOSIS — L820 Inflamed seborrheic keratosis: Secondary | ICD-10-CM | POA: Diagnosis not present

## 2021-10-01 DIAGNOSIS — D229 Melanocytic nevi, unspecified: Secondary | ICD-10-CM | POA: Diagnosis not present

## 2021-10-03 ENCOUNTER — Telehealth: Payer: Self-pay

## 2021-10-03 NOTE — Telephone Encounter (Signed)
        Patient  visited [facility] on 09/24/2021  for abdominal pain   Telephone encounter attempt :  1st attempt  A HIPAA compliant voice message was left requesting a return call.  Instructed patient to call back at [phone] at their earliest convenience.  [facility]  [place], [place]  Main Phone: [phone]  E-mail: [email or web address]  Website: [email or web address]

## 2021-10-06 ENCOUNTER — Telehealth: Payer: Self-pay

## 2021-10-06 ENCOUNTER — Telehealth: Payer: Self-pay | Admitting: Internal Medicine

## 2021-10-06 NOTE — Telephone Encounter (Signed)
[facility] would like for you to order labs so he can come get them before the 25th.  Please advise patient when order is in.

## 2021-10-06 NOTE — Telephone Encounter (Signed)
     Patient  visit on 09/24/2021  at [facility] was for Abdominal pain  Have you been able to follow up with your primary care physician? - Appointment is scheduled.  The patient was or was not able to obtain any needed medicine or equipment. - Yes  Are there diet recommendations that you are having difficulty following? - No  Patient advised all in all he feels better overall.  Patient expresses understanding of discharge instructions and education provided has no other needs at this time.    [facility]  [place], [place]  Main Phone: [phone]  E-mail: [email or web address]  Website: [email or web address]

## 2021-10-13 DIAGNOSIS — H43393 Other vitreous opacities, bilateral: Secondary | ICD-10-CM | POA: Diagnosis not present

## 2021-10-14 ENCOUNTER — Other Ambulatory Visit: Payer: Self-pay | Admitting: Internal Medicine

## 2021-10-14 DIAGNOSIS — E876 Hypokalemia: Secondary | ICD-10-CM

## 2021-10-14 DIAGNOSIS — D696 Thrombocytopenia, unspecified: Secondary | ICD-10-CM | POA: Insufficient documentation

## 2021-10-14 DIAGNOSIS — M48061 Spinal stenosis, lumbar region without neurogenic claudication: Secondary | ICD-10-CM | POA: Diagnosis not present

## 2021-10-14 DIAGNOSIS — I480 Paroxysmal atrial fibrillation: Secondary | ICD-10-CM

## 2021-10-14 DIAGNOSIS — M5459 Other low back pain: Secondary | ICD-10-CM | POA: Diagnosis not present

## 2021-10-14 DIAGNOSIS — I10 Essential (primary) hypertension: Secondary | ICD-10-CM

## 2021-10-14 DIAGNOSIS — R262 Difficulty in walking, not elsewhere classified: Secondary | ICD-10-CM | POA: Diagnosis not present

## 2021-10-14 DIAGNOSIS — M1612 Unilateral primary osteoarthritis, left hip: Secondary | ICD-10-CM | POA: Diagnosis not present

## 2021-10-14 NOTE — Progress Notes (Unsigned)
Lab Results  Component Value Date   WBC 8.1 09/28/2021   HGB 13.6 09/28/2021   HCT 40.1 09/28/2021   PLT 144 (L) 09/28/2021   GLUCOSE 96 09/30/2021   CHOL 135 02/03/2021   TRIG 114.0 02/03/2021   HDL 32.80 (L) 02/03/2021   LDLDIRECT 138 (H) 10/21/2016   LDLCALC 80 02/03/2021   ALT 9 09/26/2021   AST 14 (L) 09/26/2021   NA 142 09/30/2021   K 3.4 (L) 09/30/2021   CL 102 09/30/2021   CREATININE 0.82 09/30/2021   BUN 10 09/30/2021   CO2 32 09/30/2021   TSH 2.01 02/03/2021   PSA 1.64 12/25/2020   INR 2.1 04/01/2018   HGBA1C 5.7 07/09/2021

## 2021-10-19 ENCOUNTER — Other Ambulatory Visit: Payer: Self-pay

## 2021-10-19 ENCOUNTER — Emergency Department (HOSPITAL_BASED_OUTPATIENT_CLINIC_OR_DEPARTMENT_OTHER)
Admission: EM | Admit: 2021-10-19 | Discharge: 2021-10-19 | Disposition: A | Discharge status: Home / Self Care | Payer: Medicare Other | Attending: Emergency Medicine | Admitting: Emergency Medicine

## 2021-10-19 ENCOUNTER — Emergency Department (HOSPITAL_BASED_OUTPATIENT_CLINIC_OR_DEPARTMENT_OTHER): Payer: Medicare Other | Admitting: Radiology

## 2021-10-19 ENCOUNTER — Encounter (HOSPITAL_BASED_OUTPATIENT_CLINIC_OR_DEPARTMENT_OTHER): Payer: Self-pay

## 2021-10-19 DIAGNOSIS — I10 Essential (primary) hypertension: Secondary | ICD-10-CM | POA: Insufficient documentation

## 2021-10-19 DIAGNOSIS — I4891 Unspecified atrial fibrillation: Secondary | ICD-10-CM | POA: Diagnosis not present

## 2021-10-19 DIAGNOSIS — I480 Paroxysmal atrial fibrillation: Secondary | ICD-10-CM | POA: Insufficient documentation

## 2021-10-19 DIAGNOSIS — F418 Other specified anxiety disorders: Secondary | ICD-10-CM

## 2021-10-19 DIAGNOSIS — R0602 Shortness of breath: Secondary | ICD-10-CM | POA: Diagnosis not present

## 2021-10-19 DIAGNOSIS — Z7901 Long term (current) use of anticoagulants: Secondary | ICD-10-CM | POA: Insufficient documentation

## 2021-10-19 DIAGNOSIS — F4521 Hypochondriasis: Secondary | ICD-10-CM | POA: Insufficient documentation

## 2021-10-19 DIAGNOSIS — F419 Anxiety disorder, unspecified: Secondary | ICD-10-CM | POA: Diagnosis not present

## 2021-10-19 DIAGNOSIS — Z79899 Other long term (current) drug therapy: Secondary | ICD-10-CM | POA: Insufficient documentation

## 2021-10-19 LAB — BASIC METABOLIC PANEL
Anion gap: 7 (ref 5–15)
BUN: 16 mg/dL (ref 8–23)
CO2: 29 mmol/L (ref 22–32)
Calcium: 9.4 mg/dL (ref 8.9–10.3)
Chloride: 102 mmol/L (ref 98–111)
Creatinine, Ser: 0.82 mg/dL (ref 0.61–1.24)
GFR, Estimated: >60 mL/min (ref >60)
Glucose, Bld: 106 mg/dL — ABNORMAL HIGH (ref 70–99)
Potassium: 3.5 mmol/L (ref 3.5–5.1)
Sodium: 138 mmol/L (ref 135–145)

## 2021-10-19 LAB — CBC
HCT: 41 % (ref 39.0–52.0)
Hemoglobin: 13.8 g/dL (ref 13.0–17.0)
MCH: 30 pg (ref 26.0–34.0)
MCHC: 33.7 g/dL (ref 30.0–36.0)
MCV: 89.1 fL (ref 80.0–100.0)
Platelets: 152 10*3/uL (ref 150–400)
RBC: 4.6 MIL/uL (ref 4.22–5.81)
RDW: 13.2 % (ref 11.5–15.5)
WBC: 9.3 10*3/uL (ref 4.0–10.5)
nRBC: 0 % (ref 0.0–0.2)

## 2021-10-19 LAB — TROPONIN I (HIGH SENSITIVITY)
Troponin I (High Sensitivity): 9 ng/L (ref <18)
Troponin I (High Sensitivity): 10 ng/L (ref <18)

## 2021-10-19 LAB — MAGNESIUM: Magnesium: 1.8 mg/dL (ref 1.7–2.4)

## 2021-10-19 MED ORDER — ONDANSETRON HCL 4 MG/2ML IJ SOLN
4.0000 mg | Freq: Once | INTRAMUSCULAR | Status: AC
  Administered 2021-10-19: 4 mg via INTRAVENOUS
  Filled 2021-10-19: qty 2

## 2021-10-19 MED ORDER — AMLODIPINE BESYLATE 5 MG PO TABS
5.0000 mg | ORAL_TABLET | Freq: Once | ORAL | Status: AC
  Administered 2021-10-19: 5 mg via ORAL
  Filled 2021-10-19: qty 1

## 2021-10-19 NOTE — ED Provider Notes (Signed)
[facility] EMERGENCY DEPT Provider Note   CSN: [id number] Arrival date & time: 10/19/21  1801     History  Chief Complaint  [name] presents with   Shortness of Breath   Nausea   Dizziness   Atrial Fibrillation    Br[place] B Howeth [name] is a 71 y.o. male with paroxysmal atrial fibrillation on Eliquis, BPH, RLS, OSA, HTN, history SVT, HLD, history of bilateral PEs, cytopenia, hypokalemia, hyperaldosteronism, spinal stenosis, obesity, MDD/GAD presents with shortness of breath, nausea, lightheadedness, atrial fibrillation.  [name] has history of A-fib on Eliquis. He reports with DOE, SOB, palpitations, dizziness, and nausea that have been going on intermittently for several weeks but started again this evening. He associates this with his high blood pressure and atrial fibrillation. He is very frustrated because he has been seen in [facility] several times for these symptoms without a resolution. He recently had a friend who had a stroke and is unfortunately debilitated, and he is very concerned that his will happen to him. His daughter is a paramedic and advised him tonight to take his pulse and come to [facility] ED if it was too high.  Has been compliant with his Eliquis. He denies significant chest pain, no LOC. He states he feels incredibly anxious about his symptoms and didn't think he could wait until his cardiology appointment which is scheduled for 10/21/21. Denies leg swelling, f/c, cough, orthopnea, abd pain, urinary symptoms, D/C. [name] is very tearful on exam and states he feels hot, wishing that I leave [place] door open because he states he cannot breathe when [place] door is closed.   Per chart review, [name] presented to [facility] on 09/30/2021 for elevated blood pressure to 169 at home, he arrived to [facility] and it was normal, he had A-fib auscultated on exam.  Was seen on 09/26/2021 for abdominal pain nausea vomiting and had a negative CTAP at that time.  Echo 09/22/2021 with LV EF 60 to 65%,  G1 DD.    Shortness of Breath Dizziness Associated symptoms: shortness of breath   Atrial Fibrillation Associated symptoms include shortness of breath.       Home Medications Prior to Admission medications   Medication Sig Start Date End Date Taking? Authorizing Provider  amLODipine (NORVASC) 5 MG tablet TAKE 1 TABLET BY MOUTH EVERY DAY [name] taking differently: Take 5 mg by mouth every evening. 06/19/20   Mullis, Kiersten P, DO  atorvastatin (LIPITOR) 40 MG tablet Take 1 tablet (40 mg total) by mouth daily. 08/18/20   Martyn Malay, MD  carvedilol (COREG) 25 MG tablet Take 1 tablet (25 mg total) by mouth 2 (two) times daily with a meal. 04/22/21   Evans Lance, MD  dutasteride (AVODART) 0.5 MG capsule Take 0.5 mg by mouth daily. 03/27/21   [provider]  ELIQUIS 5 MG TABS tablet TAKE 1 TABLET BY MOUTH TWICE A DAY [name] taking differently: Take 5 mg by mouth 2 (two) times daily. 04/23/21   Evans Lance, MD  escitalopram (LEXAPRO) 20 MG tablet Take 1 tablet (20 mg total) by mouth daily. 08/25/21   [name], MD  fluticasone (FLONASE) 50 MCG/ACT nasal spray Place 2 sprays into both nostrils daily. [name] taking differently: Place 2 sprays into both nostrils daily as needed for allergies. 02/03/21   [name], MD  gabapentin (NEURONTIN) 800 MG tablet Take 1 tablet (800 mg total) by mouth 3 (three) times daily. 09/23/21   [name], MD  irbesartan (AVAPRO) 300 MG tablet Take 1 tablet (300 mg total) by mouth daily. 02/26/20   Evans Lance, MD  lubiprostone (AMITIZA) 24 MCG capsule TAKE 1 CAPSULE (24 MCG TOTAL) BY MOUTH 2 (TWO) TIMES DAILY WITH A MEAL. 09/02/21   [name], MD  Magnesium Gluconate 550 MG TABS TAKE 1 TABLET BY MOUTH 2 TIMES DAILY. 06/20/21   Hoyt Koch, MD  memantine (NAMENDA) 5 MG tablet TAKE 1 TABLET BY MOUTH TWICE A DAY 08/01/21   [name], MD  MYRBETRIQ 25 MG TB24 tablet Take 25 mg by mouth daily. 02/27/21   [provider]  ondansetron (ZOFRAN) 4 MG tablet Take 1 tablet (4 mg total) by mouth every 6 (six) hours. 09/26/21   Tretha Sciara, MD  potassium chloride SA (KLOR-CON M) 20 MEQ tablet Take 1 tablet (20 mEq total) by mouth 2 (two) times daily for 10 days. 09/26/21 10/06/21  Tretha Sciara, MD  spironolactone (ALDACTONE) 25 MG tablet Take 1 tablet (25 mg total) by mouth daily. 08/02/21   [name], MD  sucralfate (CARAFATE) 1 g tablet Take 1 tablet (1 g total) by mouth with breakfast, with lunch, and with evening meal for 7 days. 09/29/21 10/06/21  Jeanell Sparrow, DO      Allergies    Ace inhibitors and Albuterol    Review of Systems   Review of Systems  Respiratory:  Positive for shortness of breath.   Neurological:  Positive for dizziness.   Review of systems negative for LOC.  A 10 point review of systems was performed and is negative unless otherwise reported in HPI.  Physical Exam Updated Vital Signs BP (!) 174/118   Pulse 82   Temp 98.2 F (36.8 C)   Resp 19   Ht '6\' 4"'$  (1.93 m)   Wt 129.3 kg   SpO2 96%   BMI 34.69 kg/m  Physical Exam General: Upset, tearful appearing elderly male, sitting up on [place] edge of [facility] bed.  HEENT:  Sclera anicteric, MMM, trachea midline. Cardiology: RRR, no murmurs/rubs/gallops. BL radial and DP pulses equal bilaterally.  Resp: Normal respiratory rate and effort. CTAB, no wheezes, rhonchi, crackles.  Abd: Soft, non-tender, non-distended. No rebound tenderness or guarding.  GU: Deferred. MSK: No peripheral edema or signs of trauma. Extremities without deformity or TTP. No cyanosis or clubbing. Skin: warm, dry. No rashes or lesions. Neuro: A&Ox4, CNs II-XII grossly intact. MAEs. Sensation grossly intact.  Psych: Normal mood and affect.   ED Results / Procedures / Treatments   Labs (all labs ordered are listed, but only abnormal results are displayed) Labs Reviewed  BASIC METABOLIC PANEL - Abnormal; Notable for [place] following components:       Result Value   Glucose, Bld 106 (*)    All other components within normal limits  CBC  MAGNESIUM  TROPONIN I (HIGH SENSITIVITY)  TROPONIN I (HIGH SENSITIVITY)    EKG EKG Interpretation  Date/Time:  Sunday October 19 2021 18:14:01 EDT Ventricular Rate:  82 PR Interval:    QRS Duration: 92 QT Interval:  374 QTC Calculation: 436 R Axis:   -11 Text Interpretation: Atrial fibrillation with premature ventricular or aberrantly conducted complexes Abnormal ECG When compared with ECG of 28-Sep-2021 23:30, No significant change was found Confirmed by Addison Lank [phone]) on 10/21/2021 8:27:25 AM  Radiology EXAM: PORTABLE CHEST 1 VIEW   COMPARISON:  09/29/2021 and prior studies   FINDINGS: Cardiomegaly noted. There is no evidence of focal airspace  disease, pulmonary edema, suspicious pulmonary nodule/mass, pleural effusion, or pneumothorax. No acute bony abnormalities are identified. Remote RIGHT clavicle fracture again noted.   IMPRESSION: Cardiomegaly without acute cardiopulmonary disease.   Procedures Procedures    Medications Ordered in ED Medications  ondansetron (ZOFRAN) injection 4 mg (4 mg Intravenous Given 10/19/21 1917)  amLODipine (NORVASC) tablet 5 mg (5 mg Oral Given 10/19/21 2059)    ED Course/ Medical Decision Making/ A&P                          Medical Decision Making Amount and/or Complexity of Data Reviewed Labs: ordered. Decision-making details documented in ED Course. Radiology: ordered. Decision-making details documented in ED Course.  Risk Prescription drug management.   [name] presents with SOB, nausea, dizziness. He is hypertensive to [place] 170s and is no tachycardic, afebrile. He is overall tearful on exam. He is speaking in full sentences without any respiratory distress or any increased WOB. His lung auscultation is wnl, no crackles or wheezes.   For [name]'s symptoms, consider hypertensive emergency, paroxysmal afib w/ RVR, MI.  Recently had an echo that only showed G1DD, lower c/f CHF exacerbation, though [name] does report SOB, will evaluate w/ CXR as well for pleural effusion/pulm edema/PNA. Low c/f PE, no s/s DVT, no tachycardia or chest pain. Low c/f aortic dissection with no CP and equal pulses. Consider electrolyte abnormalities, vasovagal reaction, GERD/gastritis as well. At [facility] time of my evaluation, [name]'s blood pressure has decreased into 010X systolic and his HR is wnl, though [name] still reports he feels dizzy and nauseated.  [name] also appears to be extremely anxious about his symptoms and feels claustrophobic, panicky when [place] door is closed. Appears that his symptoms have at least a component of anxiety.  I had a long discussion with [place] [name] about his concerns about his health and reassured [place] [name] that following up with cardiology o/p is Brandon right move and we will ensure today that he is not having an emergency. Validated [name]'s anxiety and his frustrations about his symptoms. Discussed diagnostic and treatment plans with [name] and will treat now with zofran and his home nightly amlodipine dose. Will obtain labs/imaging/trops/EKG.   I have personally reviewed and interpreted all labs and imaging.   Clinical Course as of 11/10/21 1323  Sun Oct 19, 2021  1905 DG Chest [place] 1 View Cardiomegaly without acute cardiopulmonary disease. [HN]  1905 Hemoglobin: 13.8 [HN]  1905 WBC: 9.3 [HN]  3235 Basic metabolic panel(!) wnl [HN]  2014 Troponin I (High Sensitivity): 9 [HN]  2014 Magnesium: 1.8 [HN]  2302 [name] reports that  [HN]    Clinical Course User Index [HN] Audley Hose, MD   [name]'s w/u very reassuring. Trop 10-9, unremarkable BMP/CBC, no lyte abnormalities, CXR with similar cardiomegaly to prior. EKG w/o signs of ischemia, Afib w/ rate control in 80s bpm. [name] reports he has not missed any doses of eliquis. He is feeling much better, dizziness/nausea/SOB have resolved at  this time, and [name] is taking PO. He is no longer tearful or anxious. He is reassured with his w/u but frustrated this keeps happening. He sees o/p cards in 2 days and I reassured that he does not need to be admitted to [facility] hospital at this time and that he can continue to f/u o/p. We also discussed panic attacks and mental health options - [name] does not see a counselor or therapist, and I relayed that this may be helpful  for him. He is in agreement and he is given a resource guide for o/p counseling, and he states he will make an appointment.   [name] is discharged w/ specific discharge instructions/return precautions. All questions answered to [name]'s satisfaction.   Dispo: DC in stable condition         Final Clinical Impression(s) / ED Diagnoses Final diagnoses:  Paroxysmal atrial fibrillation ([place])  Anxiety about health    Rx / DC Orders ED Discharge Orders     None        This note was created using dictation software, which may contain spelling or grammatical errors.    Audley Hose, MD 11/15/21 [phone]

## 2021-10-19 NOTE — Progress Notes (Unsigned)
Cardiology Office Note Date:  10/19/2021  Patient ID:  [place] Schmieder., DOB Jan 30, 1950, MRN [id number] PCP:  [name], MD  Electrophysiologist: Dr. Lovena Le  ***refresh   Chief Complaint: *** hospital f/u  History of Present Illness: [place] Shader. is a 71 y.o. male with history of SVT, AFib, b/l PE (remote, 2014), chronic back pain, HTN, HLD.  He saw Dr. Lovena Le March 2023, discussed unclear if his Afib was 2/2 his SVT or independent of the SVT, they discussed perhaps stopping a/c, pt preferred to continue. Planned for back surgery, felt an acceptable cardiac candidate.  09/26/21 ER visit w/HTN, abdominal /nausea 172/120 in ER Discharged from the ER after replacing K+, given Rx for K+ Perhaps early colitis vs gastroenteritis  Patient reached out 09/29/21 with concerns of HTN and low K+ to be seen.  ER visit 09/29/21 with reports of CP and high BP. 110/70 Labs unrevealing, EKGs felt nonischemic  Symptoms resolved with GI cocktail Discharged from the ER with Rx for carafate and instructions to f/u with cardiology  Returned to the ER 09/30/21 with ongoing concerns of HTN and potential risk to his kidneys with uncontrolled HTN, as well as N/V. BP in triage was 134/86, he was in rate controlled FAib, repeat BMET Stable with mild hypokalemia of 3.4 The pt was discharged advised to minimize his BP checks at home given anxiety producing  F/u labs ordered via his   9/22 : K+ 2.7 9/24: 3.3 9/26 3.4  09/28/21: HS Trop 15, 12 09/30/21: BUN/Creat 10/0.82   *** BP *** symptoms?? CP, N/V, GI? *** 2018; large prior inferolateral infarct and mild peri-infarct ischemia, + CAD by CT *** in AFib on 22 and 24th, ?  DCCV? > thden ischemic eval? *** symptoms new with Afib??    Past Medical History:  Diagnosis Date   Anxiety    Arthritis    Benign neoplasm of colon    Benign prostatic hyperplasia with urinary obstruction    Bilateral inguinal hernia 05/29/2011   Bilateral  pulmonary embolism ([employer]) 3/14   admitted to [facility],  treated with Xarelto   Breast lump 03/29/2019   Calculus of kidney 10/21/2016   Cataract    Chronic bilateral low back pain with sciatica 03/21/2012   Chronic pain    Finger mass, right 10/17/2020   History of alcohol abuse 03/07/2016   History of colon polyps 11/18/2015   History of kidney stones    History of pulmonary embolism 03/19/2012   Hyperlipemia    Hypertension    Insomnia    Insomnia due to medical condition 12/23/2016   Polyuria secondary to BPH   Major depressive disorder, recurrent episode ([place])    Mitral regurgitation 04/24/2013   Mild by TEE   Morbid obesity ([place]) 03/07/2016   Nonrheumatic mitral valve insufficiency 04/25/2013   Mild by TEE  Overview:  Overview:  Mild by TEE Overview:  Overview:  Overview:  Mild by TEE Overview:  Overview:  Mild by TEE   Obesity    OSA (obstructive sleep apnea)    noncompliant with CPAP.  07/27/13- awaiting a CPAP- unable to tolerate mask   Osteoporosis    Persistent atrial fibrillation (HCC)    Recurrent unilateral inguinal hernia 07/17/2013   Restless leg syndrome    takes gabapentin   S/P right total knee arthroplasty 05/24/2018   Sciatica    Spinal stenosis, lumbar region with neurogenic claudication    SVT (supraventricular tachycardia) ([place]) 03/07/2016  Thrombus of left atrial appendage 06/09/4648   Uncomplicated asthma 3/54/6568   Overview:  Overview:  Overview:  Overview:  Qualifier: Diagnosis of  By: Sherren Mocha MD, Jory Ee Overview:  Overview:  Qualifier: Diagnosis of  By: Sherren Mocha MD, Jory Ee   Ventral hernia, unspecified, without mention of obstruction or gangrene    right abdominal wall    Past Surgical History:  Procedure Laterality Date   CARDIOVERSION N/A 03/21/2012   Procedure: CARDIOVERSION;  Surgeon: Birdie Riddle, MD;  Location: [facility]OSCOPY;  Service: Cardiovascular;  Laterality: N/A;   CARDIOVERSION N/A 04/24/2013   Procedure: CARDIOVERSION;  Surgeon: Dorothy Spark, MD;  Location: [facility] END[facility]COPY;  Service: Cardiovascular;  Laterality: N/A;   CARDIOVERSION N/A 06/27/2015   Procedure: CARDIOVERSION;  Surgeon: Larey Dresser, MD;  Location: [facility];  Service: Cardiovascular;  Laterality: N/A;   CATARACT EXTRACTION     COLONOSCOPY W/ POLYPECTOMY     ELECTROPHYSIOLOGIC STUDY N/A 07/30/2015   Procedure: Atrial Fibrillation Ablation;  Surgeon: Thompson Grayer, MD;  Location: [facility] CV LAB;  Service: Cardiovascular;  Laterality: N/A;   EXTRACORPOREAL SHOCK WAVE LITHOTRIPSY Right 10/29/2016   Procedure: RIGHT EXTRACORPOREAL SHOCK WAVE LITHOTRIPSY (ESWL);  Surgeon: Alexis Frock, MD;  Location: [facility];  Service: Urology;  Laterality: Right;   EYE SURGERY Right    cataract   FEMUR FRACTURE SURGERY     HERNIA REPAIR  07/06/2011   INGUINAL HERNIA REPAIR Right 07/28/2013   Procedure: RIGHT INGUINAL HERNIA REPAIR;  Surgeon: Imogene Burn. Georgette Dover, MD;  Location: [facility];  Service: General;  Laterality: Right;   INGUINAL HERNIA REPAIR Right 09/22/2016   Procedure: LAPAROSCOPIC RIGHT INGUINAL HERNIA;  Surgeon: Kieth Brightly Arta Bruce, MD;  Location: [facility];  Service: General;  Laterality: Right;  With MESH   INSERTION OF MESH Right 07/28/2013   Procedure: INSERTION OF MESH;  Surgeon: Imogene Burn. Georgette Dover, MD;  Location: [facility];  Service: General;  Laterality: Right;   KNEE ARTHROSCOPY     left   REPLACEMENT TOTAL KNEE Left    ROTATOR CUFF REPAIR     right   ROTATOR CUFF REPAIR Left 03/08/2014   DR SUPPLE   SHOULDER ARTHROSCOPY WITH ROTATOR CUFF REPAIR AND SUBACROMIAL DECOMPRESSION Left 03/08/2014   Procedure: LEFT SHOULDER ARTHROSCOPY WITH ROTATOR CUFF REPAIR/SUBACROMIAL DECOMPRESSION/DISTAL CLAVICLE RESECTION;  Surgeon: Marin Shutter, MD;  Location: [facility];  Service: Orthopedics;  Laterality: Left;   SVT ABLATION N/A 01/23/2019   Procedure: SVT ABLATION;  Surgeon: Evans Lance, MD;  Location: [facility] CV [facility];  Service: Cardiovascular;  Laterality: N/A;   TEE WITHOUT  CARDIOVERSION N/A 03/21/2012   Procedure: TRANSESOPHAGEAL ECHOCARDIOGRAM (TEE);  Surgeon: Birdie Riddle, MD;  Location: [facility];  Service: Cardiovascular;  Laterality: N/A;   TEE WITHOUT CARDIOVERSION N/A 04/24/2013   Procedure: TRANSESOPHAGEAL ECHOCARDIOGRAM (TEE);  Surgeon: Dorothy Spark, MD;  Location: [facility];  Service: Cardiovascular;  Laterality: N/A;   TEE WITHOUT CARDIOVERSION N/A 07/29/2015   Procedure: TRANSESOPHAGEAL ECHOCARDIOGRAM (TEE);  Surgeon: Fay Records, MD;  Location: [facility];  Service: Cardiovascular;  Laterality: N/A;   TONSILLECTOMY     TOTAL KNEE ARTHROPLASTY Right 05/24/2018   Procedure: RIGHT TOTAL KNEE ARTHROPLASTY;  Surgeon: Paralee Cancel, MD;  Location: [facility];  Service: Orthopedics;  Laterality: Right;  70 mins    Current Outpatient Medications  Medication Sig Dispense Refill   amLODipine (NORVASC) 5 MG tablet TAKE 1 TABLET BY MOUTH EVERY DAY (Patient taking differently: Take 5 mg by mouth  every evening.) 90 tablet 3   atorvastatin (LIPITOR) 40 MG tablet Take 1 tablet (40 mg total) by mouth daily. 90 tablet 3   carvedilol (COREG) 25 MG tablet Take 1 tablet (25 mg total) by mouth 2 (two) times daily with a meal. 180 tablet 3   dutasteride (AVODART) 0.5 MG capsule Take 0.5 mg by mouth daily.     ELIQUIS 5 MG TABS tablet TAKE 1 TABLET BY MOUTH TWICE A DAY (Patient taking differently: Take 5 mg by mouth 2 (two) times daily.) 60 tablet 5   escitalopram (LEXAPRO) 20 MG tablet Take 1 tablet (20 mg total) by mouth daily. 90 tablet 1   fluticasone (FLONASE) 50 MCG/ACT nasal spray Place 2 sprays into both nostrils daily. (Patient taking differently: Place 2 sprays into both nostrils daily as needed for allergies.) 48 g 1   gabapentin (NEURONTIN) 800 MG tablet Take 1 tablet (800 mg total) by mouth 3 (three) times daily. 270 tablet 1   irbesartan (AVAPRO) 300 MG tablet Take 1 tablet (300 mg total) by mouth daily. 90 tablet 1   lubiprostone (AMITIZA) 24 MCG  capsule TAKE 1 CAPSULE (24 MCG TOTAL) BY MOUTH 2 (TWO) TIMES DAILY WITH A MEAL. 180 capsule 0   Magnesium Gluconate 550 MG TABS TAKE 1 TABLET BY MOUTH 2 TIMES DAILY. 60 tablet 0   memantine (NAMENDA) 5 MG tablet TAKE 1 TABLET BY MOUTH TWICE A DAY 180 tablet 1   MYRBETRIQ 25 MG TB24 tablet Take 25 mg by mouth daily.     ondansetron (ZOFRAN) 4 MG tablet Take 1 tablet (4 mg total) by mouth every 6 (six) hours. 12 tablet 0   potassium chloride SA (KLOR-CON M) 20 MEQ tablet Take 1 tablet (20 mEq total) by mouth 2 (two) times daily for 10 days. 20 tablet 0   spironolactone (ALDACTONE) 25 MG tablet Take 1 tablet (25 mg total) by mouth daily. 90 tablet 0   sucralfate (CARAFATE) 1 g tablet Take 1 tablet (1 g total) by mouth with breakfast, with lunch, and with evening meal for 7 days. 21 tablet 0   No current facility-administered medications for this visit.    Allergies:   Ace inhibitors and Albuterol   Social History:  The patient  reports that he quit smoking about 47 years ago. His smoking use included cigarettes. He has a 2.50 pack-year smoking history. He has never used smokeless tobacco. He reports that he does not drink alcohol and does not use drugs.   Family History:  The patient's family history includes Asthma in his mother; CVA in an other family member; Cancer in his father and paternal grandmother; Diabetes in an other family member; Heart failure in his mother; Hypertension in his mother.  ROS:  Please see the history of present illness.    All other systems are reviewed and otherwise negative.   PHYSICAL EXAM:  VS:  There were no vitals taken for this visit. BMI: There is no height or weight on file to calculate BMI. Well nourished, well developed, in no acute distress HEENT: normocephalic, atraumatic Neck: no JVD, carotid bruits or masses Cardiac:  *** RRR; no significant murmurs, no rubs, or gallops Lungs:  *** CTA b/l, no wheezing, rhonchi or rales Abd: soft, nontender MS: no  deformity or *** atrophy Ext: *** no edema Skin: warm and dry, no rash Neuro:  No gross deficits appreciated Psych: euthymic mood, full affect   EKG:  Done today and reviewed by myself shows  ***  01/23/2019: EPS/ablation Conclusion: Successful EP study and catheter ablation utilizing three-dimensional electroanatomic mapping of inducible AV node reentrant tachycardia.  Following catheter ablation, there was no inducible SVT, no AH jumps, and no echo beats.   09/24/2016: TTE Study Conclusions  - Left ventricle: The cavity size was normal. Systolic function was    normal. The estimated ejection fraction was in the range of 60%    to 65%. Wall motion was normal; there were no regional wall    motion abnormalities. Doppler parameters are consistent with    abnormal left ventricular relaxation (grade 1 diastolic    dysfunction). There was no evidence of elevated ventricular    filling pressure by Doppler parameters.  - Aortic valve: Trileaflet; normal thickness leaflets. There was no    regurgitation.  - Mitral valve: There was trivial regurgitation.  - Right ventricle: The cavity size was normal. Wall thickness was    normal. Systolic function was normal.  - Tricuspid valve: There was no regurgitation.  - Pulmonary arteries: Systolic pressure could not be accurately    estimated.  - Inferior vena cava: The vessel was normal in size.  - Pericardium, extracardiac: There was no pericardial effusion.   03/22/2016: stress myoview Nuclear stress EF: 53%. There was no ST segment deviation noted during stress. Defect 1: There is a large defect of severe severity present in the basal inferolateral, mid inferolateral and apex location. This is a low risk study. The left ventricular ejection fraction is mildly decreased (45-54%).   Low risk stress nuclear study with large prior inferolateral infarct and mild peri-infarct ischemia; EF 53 with inferolateral hypokinesis; mild LVE.  Recent  Labs: 02/03/2021: TSH 2.01 07/09/2021: Magnesium 2.0 09/26/2021: ALT 9 09/28/2021: Hemoglobin 13.6; Platelets 144 09/30/2021: BUN 10; Creatinine, Ser 0.82; Potassium 3.4; Sodium 142  02/03/2021: Cholesterol 135; HDL 32.80; LDL Cholesterol 80; Total CHOL/HDL Ratio 4; Triglycerides 114.0; VLDL 22.8   CrCl cannot be calculated (Unknown ideal weight.).   Wt Readings from Last 3 Encounters:  09/28/21 285 lb (129.3 kg)  09/26/21 277 lb (125.6 kg)  07/24/21 277 lb (125.6 kg)     Other studies reviewed: Additional studies/records reviewed today include: summarized above  ASSESSMENT AND PLAN:  SVT S/p AVNRT ablation 2021  Paroxysmal AFib CHA2DS2Vasc is 5, on Eliquis, *** appropriately dosed *** burden  HTN ***  Disposition: F/u with ***  Current medicines are reviewed at length with the patient today.  The patient did not have any concerns regarding medicines.  Venetia Night, PA-C 10/19/2021 2:15 PM     [address] [place] [address] [phone] (office)  [phone] (fax)

## 2021-10-19 NOTE — ED Notes (Signed)
Pt given snacks and coke to drink per request, okay with provider that pt may eat at this time

## 2021-10-19 NOTE — ED Notes (Signed)
[facility] provider at bedside

## 2021-10-19 NOTE — Discharge Instructions (Addendum)
Thank you for coming to [facility] Emergency Department. You were seen for chest pain, shortness of breath. We did an exam, labs, and imaging, and these showed no acute findings.   Please follow up with your electrophysiologist on Tuesday at 8 am as scheduled. They may want to adjust your home blood pressure medications.  It is normal to feel anxious about your health. I do not want these feelings of anxiety to impact your life in such a negative way. I have also provided outpatient resources for mental health. Please seek an outpatient therapist or counselor who may be able to help you with your anxiety symptoms. They can also help with grief about your friend.  Do not hesitate to return to the ED or call 911 if you experience: -Worsening symptoms -Chest pain, shortness of breath -Lightheadedness, passing out -Fevers/chills -Anything else that concerns you

## 2021-10-19 NOTE — ED Triage Notes (Signed)
Pt c/o sob, nausea, and dizziness that over the past week. Pt states he has been going in and out of [facility].

## 2021-10-19 NOTE — ED Notes (Signed)
Update given to pt, questions and concerns address, NAD noted, nothing further at this time

## 2021-10-21 ENCOUNTER — Ambulatory Visit: Payer: Medicare Other | Attending: Physician Assistant | Admitting: Physician Assistant

## 2021-10-21 ENCOUNTER — Ambulatory Visit: Payer: Medicare Other | Attending: Physician Assistant

## 2021-10-21 ENCOUNTER — Encounter: Payer: Self-pay | Admitting: *Deleted

## 2021-10-21 VITALS — BP 132/102 | HR 69 | Ht 75.0 in | Wt 279.0 lb

## 2021-10-21 DIAGNOSIS — I480 Paroxysmal atrial fibrillation: Secondary | ICD-10-CM

## 2021-10-21 DIAGNOSIS — I10 Essential (primary) hypertension: Secondary | ICD-10-CM | POA: Diagnosis not present

## 2021-10-21 DIAGNOSIS — R0789 Other chest pain: Secondary | ICD-10-CM | POA: Insufficient documentation

## 2021-10-21 DIAGNOSIS — I471 Supraventricular tachycardia, unspecified: Secondary | ICD-10-CM | POA: Insufficient documentation

## 2021-10-21 NOTE — Patient Instructions (Addendum)
Medication Instructions:   Your physician recommends that you continue on your current medications as directed. Please refer to the Current Medication list given to you today.   *If you need a refill on your cardiac medications before your next appointment, please call your pharmacy*   Lab Work: JAARS    If you have labs (blood work) drawn today and your tests are completely normal, you will receive your results only by: [place] (if you have MyChart) OR A paper copy in the mail If you have any lab test that is abnormal or we need to change your treatment, we will call you to review the results.   Testing/Procedures:  Echocardiography is a painless test that uses sound waves to create images of your heart. It provides your doctor with information about the size and shape of your heart and how well your heart's chambers and valves are working. This procedure takes approximately one hour. There are no restrictions for this procedure. Please do NOT wear cologne, perfume, aftershave, or lotions (deodorant is allowed). Please arrive 15 minutes prior to your appointment time.   Your physician has requested that you have a lexiscan myoview. For further information please visit [email or web address]. Please follow instruction sheet, as given.   SEE LETTER Your physician has recommended that you have a Cardioversion (DCCV). Electrical Cardioversion uses a jolt of electricity to your heart either through paddles or wired patches attached to your chest. This is a controlled, usually prescheduled, procedure. Defibrillation is done under light anesthesia in the hospital, and you usually go home the day of the procedure. This is done to get your heart back into a normal rhythm. You are not awake for the procedure. Please see the instruction sheet given to you today.   Your physician has recommended that you wear an event monitor. Event monitors are medical devices that record the  heart's electrical activity. Doctors most often [place] these monitors to diagnose arrhythmias. Arrhythmias are problems with the speed or rhythm of the heartbeat. The monitor is a small, portable device. You can wear one while you do your normal daily activities. This is usually used to diagnose what is causing palpitations/syncope (passing out).    Follow-Up: At [facility], you and your health needs are our priority.  As part of our continuing mission to provide you with exceptional heart care, we have created designated Provider Care Teams.  These Care Teams include your primary Cardiologist (physician) and Advanced Practice Providers (APPs -  Physician Assistants and Nurse Practitioners) who all work together to provide you with the care you need, when you need it.  We recommend signing up for the patient portal called "MyChart".  Sign up information is provided on this After Visit Summary.  MyChart is used to connect with patients for Virtual Visits (Telemedicine).  Patients are able to view lab/test results, encounter notes, upcoming appointments, etc.  Non-urgent messages can be sent to your provider as well.   To learn more about what you can do with MyChart, go to [email or web address].    Your next appointment:   2 month(s)  The format for your next appointment:   In Person  Provider:   You may see Dr. Lovena Le  or one of the following Advanced Practice Providers on your designated Care Team:   Tommye Standard, [place] Legrand Como "Jonni Sanger" Chalmers Cater, [place]   Other Instructions  Bryn Gulling- Long Term Monitor Instructions  Your physician has requested you wear a ZIO patch monitor  for 14 days.  This is a single patch monitor. [employer] supplies one patch monitor per enrollment. Additional stickers are not available. Please do not apply patch if you will be having a Nuclear Stress Test,  Echocardiogram, Cardiac CT, MRI, or Chest Xray during the period you would be wearing the  monitor. The patch  cannot be worn during these tests. You cannot remove and re-apply the  ZIO XT patch monitor.  Your ZIO patch monitor will be mailed 3 day USPS to your address on file. It may take 3-5 days  to receive your monitor after you have been enrolled.  Once you have received your monitor, please review the enclosed instructions. Your monitor  has already been registered assigning a specific monitor serial # to you.  Billing and Patient Assistance Program Information  We have supplied [employer] with any of your insurance information on file for billing purposes. [employer] offers a sliding scale Patient Assistance Program for patients that do not have  insurance, or whose insurance does not completely cover the cost of the ZIO monitor.  You must apply for the Patient Assistance Program to qualify for this discounted rate.  To apply, please call [employer] at [phone], select option 4, select option 2, ask to apply for  Patient Assistance Program. Theodore Demark will ask your household income, and how many people  are in your household. They will quote your out-of-pocket cost based on that information.  [employer] will also be able to set up a 36-month interest-free payment plan if needed.  Applying the monitor   Shave hair from upper left chest.  Hold abrader disc by orange tab. Rub abrader in 40 strokes over the upper left chest as  indicated in your monitor instructions.  Clean area with 4 enclosed alcohol pads. Let dry.  Apply patch as indicated in monitor instructions. Patch will be placed under collarbone on left  side of chest with arrow pointing upward.  Rub patch adhesive wings for 2 minutes. Remove white label marked "1". Remove the white  label marked "2". Rub patch adhesive wings for 2 additional minutes.  While looking in a mirror, press and release button in center of patch. A small green light will  flash 3-4 times. This will be your only indicator that the monitor has been turned on.  Do not  shower for the first 24 hours. You may shower after the first 24 hours.  Press the button if you feel a symptom. You will hear a small click. Record Date, Time and  Symptom in the Patient Logbook.  When you are ready to remove the patch, follow instructions on the last 2 pages of Patient  Logbook. Stick patch monitor onto the last page of Patient Logbook.  Place Patient Logbook in the blue and white box. Use locking tab on box and tape box closed  securely. The blue and white box has prepaid postage on it. Please place it in the mailbox as  soon as possible. Your physician should have your test results approximately 7 days after the  monitor has been [facility]  Call [place]at [phone] you have questions regarding  your ZIO XT patch monitor. Call them immediately if you see an orange light blinking on your  monitor.  If your monitor falls off in less than 4 days, contact our Monitor department at [phone]  If your monitor becomes loose or falls off after 4 days call [employer] at [phone]  suggestions  on securing your monitor  Important Information About [employer]

## 2021-10-21 NOTE — Progress Notes (Unsigned)
Enrolled for [employer] to mail a ZIO XT long term holter monitor to the patients address on file.  °Dr. Taylor to read. °

## 2021-10-25 DIAGNOSIS — I480 Paroxysmal atrial fibrillation: Secondary | ICD-10-CM | POA: Diagnosis not present

## 2021-10-28 ENCOUNTER — Telehealth (HOSPITAL_COMMUNITY): Payer: Self-pay

## 2021-10-28 NOTE — Telephone Encounter (Signed)
Detailed instructions left on the patient's answering machine. Asked to call back with any questions. S.Kinsley Nicklaus EMTP 

## 2021-10-29 ENCOUNTER — Ambulatory Visit: Payer: Medicare Other | Admitting: Internal Medicine

## 2021-10-29 DIAGNOSIS — R21 Rash and other nonspecific skin eruption: Secondary | ICD-10-CM | POA: Diagnosis not present

## 2021-10-29 DIAGNOSIS — Q825 Congenital non-neoplastic nevus: Secondary | ICD-10-CM | POA: Diagnosis not present

## 2021-10-29 DIAGNOSIS — L820 Inflamed seborrheic keratosis: Secondary | ICD-10-CM | POA: Diagnosis not present

## 2021-10-29 DIAGNOSIS — L821 Other seborrheic keratosis: Secondary | ICD-10-CM | POA: Diagnosis not present

## 2021-10-30 ENCOUNTER — Ambulatory Visit (HOSPITAL_COMMUNITY): Payer: Medicare Other | Attending: Cardiovascular Disease

## 2021-10-30 VITALS — Ht 75.0 in | Wt 279.0 lb

## 2021-10-30 DIAGNOSIS — R0789 Other chest pain: Secondary | ICD-10-CM | POA: Insufficient documentation

## 2021-10-30 DIAGNOSIS — R110 Nausea: Secondary | ICD-10-CM | POA: Diagnosis not present

## 2021-10-30 LAB — MYOCARDIAL PERFUSION IMAGING
LV dias vol: 152 mL (ref 62–150)
LV sys vol: 69 mL
Nuc Stress EF: 55 %
Peak HR: 96 {beats}/min
Rest HR: 79 {beats}/min
Rest Nuclear Isotope Dose: 10.1 mCi
SDS: 1
SRS: 0
SSS: 1
ST Depression (mm): 0 mm
Stress Nuclear Isotope Dose: 31.7 mCi
TID: 1.07

## 2021-10-30 MED ORDER — TECHNETIUM TC 99M TETROFOSMIN IV KIT
10.1000 | PACK | Freq: Once | INTRAVENOUS | Status: AC | PRN
  Administered 2021-10-30: 10.1 via INTRAVENOUS

## 2021-10-30 MED ORDER — AMINOPHYLLINE 25 MG/ML IV SOLN
75.0000 mg | Freq: Once | INTRAVENOUS | Status: AC
  Administered 2021-10-30: 75 mg via INTRAVENOUS

## 2021-10-30 MED ORDER — REGADENOSON 0.4 MG/5ML IV SOLN
0.4000 mg | Freq: Once | INTRAVENOUS | Status: AC
  Administered 2021-10-30: 0.4 mg via INTRAVENOUS

## 2021-10-30 MED ORDER — TECHNETIUM TC 99M TETROFOSMIN IV KIT
31.7000 | PACK | Freq: Once | INTRAVENOUS | Status: AC | PRN
  Administered 2021-10-30: 31.7 via INTRAVENOUS

## 2021-10-31 ENCOUNTER — Ambulatory Visit (HOSPITAL_COMMUNITY): Payer: Medicare Other

## 2021-10-31 ENCOUNTER — Other Ambulatory Visit: Payer: Self-pay | Admitting: *Deleted

## 2021-10-31 MED ORDER — AMLODIPINE BESYLATE 5 MG PO TABS: 5.0000 mg | ORAL_TABLET | Freq: Every day | ORAL | 3 refills | Status: DC

## 2021-11-06 ENCOUNTER — Encounter (HOSPITAL_COMMUNITY): Payer: Self-pay

## 2021-11-06 ENCOUNTER — Ambulatory Visit (HOSPITAL_COMMUNITY): Payer: Medicare Other

## 2021-11-06 ENCOUNTER — Telehealth: Payer: Self-pay

## 2021-11-06 NOTE — Telephone Encounter (Signed)
        Patient  visited [facility] on 10/19/2021  for Paroxysmal atrial fibrillation.   Telephone encounter attempt :  1st  A HIPAA compliant voice message was left requesting a return call.  Instructed patient to call back at [phone].   [place] Resource Care Guide   ??[email or web address]  ?? [phone]   Website: [email or web address]  [email or web address]

## 2021-11-07 ENCOUNTER — Telehealth: Payer: Self-pay

## 2021-11-07 NOTE — Telephone Encounter (Signed)
        Patient  visited [facility] on 10/19/2021  for Paroxysmal atrial fibrillation.   Telephone encounter attempt :  2nd  A HIPAA compliant voice message was left requesting a return call.  Instructed patient to call back at [phone].   [place] Resource Care Guide   ??[email or web address]  ?? [phone]   Website: [email or web address]  [email or web address]

## 2021-11-10 ENCOUNTER — Telehealth: Payer: Self-pay

## 2021-11-10 NOTE — Telephone Encounter (Signed)
     Patient  visit on 10/19/2021  at [facility] was for Paroxysmal atrial fibrillation.  Have you been able to follow up with your primary care physician? Yes  The patient was or was not able to obtain any needed medicine or equipment. Patient obtained medications.  Are there diet recommendations that you are having difficulty following? No  Patient expresses understanding of discharge instructions and education provided has no other needs at this time.    [place] Resource Care Guide   ??[email or web address]  ?? [phone]   Website: [email or web address]  [email or web address]

## 2021-11-11 ENCOUNTER — Ambulatory Visit (HOSPITAL_COMMUNITY): Payer: Medicare Other | Admitting: Anesthesiology

## 2021-11-11 ENCOUNTER — Other Ambulatory Visit: Payer: Self-pay

## 2021-11-11 ENCOUNTER — Encounter (HOSPITAL_COMMUNITY)
Admission: RE | Disposition: A | Discharge status: Home / Self Care | Payer: Self-pay | Source: Home / Self Care | Attending: Cardiology

## 2021-11-11 ENCOUNTER — Ambulatory Visit (HOSPITAL_COMMUNITY)
Admission: RE | Admit: 2021-11-11 | Discharge: 2021-11-11 | Disposition: A | Discharge status: Home / Self Care | Payer: Medicare Other | Attending: Cardiology | Admitting: Cardiology

## 2021-11-11 ENCOUNTER — Ambulatory Visit (HOSPITAL_BASED_OUTPATIENT_CLINIC_OR_DEPARTMENT_OTHER): Payer: Medicare Other | Admitting: Anesthesiology

## 2021-11-11 ENCOUNTER — Encounter (HOSPITAL_COMMUNITY): Payer: Self-pay | Admitting: Cardiology

## 2021-11-11 DIAGNOSIS — I10 Essential (primary) hypertension: Secondary | ICD-10-CM | POA: Diagnosis not present

## 2021-11-11 DIAGNOSIS — Z86711 Personal history of pulmonary embolism: Secondary | ICD-10-CM | POA: Diagnosis not present

## 2021-11-11 DIAGNOSIS — E785 Hyperlipidemia, unspecified: Secondary | ICD-10-CM | POA: Diagnosis not present

## 2021-11-11 DIAGNOSIS — Z7901 Long term (current) use of anticoagulants: Secondary | ICD-10-CM | POA: Diagnosis not present

## 2021-11-11 DIAGNOSIS — J45909 Unspecified asthma, uncomplicated: Secondary | ICD-10-CM | POA: Diagnosis not present

## 2021-11-11 DIAGNOSIS — I471 Supraventricular tachycardia, unspecified: Secondary | ICD-10-CM | POA: Diagnosis not present

## 2021-11-11 DIAGNOSIS — I4819 Other persistent atrial fibrillation: Secondary | ICD-10-CM

## 2021-11-11 DIAGNOSIS — G4730 Sleep apnea, unspecified: Secondary | ICD-10-CM | POA: Diagnosis not present

## 2021-11-11 DIAGNOSIS — I480 Paroxysmal atrial fibrillation: Secondary | ICD-10-CM | POA: Diagnosis not present

## 2021-11-11 DIAGNOSIS — I4891 Unspecified atrial fibrillation: Secondary | ICD-10-CM

## 2021-11-11 DIAGNOSIS — Z87891 Personal history of nicotine dependence: Secondary | ICD-10-CM

## 2021-11-11 HISTORY — PX: CARDIOVERSION: SHX1299

## 2021-11-11 LAB — POCT I-STAT, CHEM 8
BUN: 18 mg/dL (ref 8–23)
Calcium, Ion: 1.19 mmol/L (ref 1.15–1.40)
Chloride: 102 mmol/L (ref 98–111)
Creatinine, Ser: 0.8 mg/dL (ref 0.61–1.24)
Glucose, Bld: 112 mg/dL — ABNORMAL HIGH (ref 70–99)
HCT: 47 % (ref 39.0–52.0)
Hemoglobin: 16 g/dL (ref 13.0–17.0)
Potassium: 3.9 mmol/L (ref 3.5–5.1)
Sodium: 140 mmol/L (ref 135–145)
TCO2: 27 mmol/L (ref 22–32)

## 2021-11-11 SURGERY — CARDIOVERSION
Anesthesia: General

## 2021-11-11 MED ORDER — PROPOFOL 10 MG/ML IV BOLUS
INTRAVENOUS | Status: DC | PRN
  Administered 2021-11-11: 70 mg via INTRAVENOUS
  Administered 2021-11-11: 60 mg via INTRAVENOUS

## 2021-11-11 MED ORDER — LIDOCAINE 2% (20 MG/ML) 5 ML SYRINGE
INTRAMUSCULAR | Status: DC | PRN
  Administered 2021-11-11: 100 mg via INTRAVENOUS

## 2021-11-11 MED ORDER — SODIUM CHLORIDE 0.9 % IV SOLN: INTRAVENOUS | Status: DC

## 2021-11-11 NOTE — Anesthesia Preprocedure Evaluation (Addendum)
Anesthesia Evaluation  Patient identified by MRN, date of birth, ID band Patient awake    Reviewed: Allergy & Precautions, H&P , NPO status , Patient's Chart, lab work & pertinent test results  Airway Mallampati: II  TM Distance: >3 FB Neck ROM: Full    Dental no notable dental hx.    Pulmonary asthma , sleep apnea , former smoker   Pulmonary exam normal breath sounds clear to auscultation       Cardiovascular hypertension, Pt. on medications + dysrhythmias Atrial Fibrillation  Rhythm:Regular Rate:Normal  Lexiscan 10/23 Normal stress test, no evidence of heart blockages   Neuro/Psych negative neurological ROS  negative psych ROS   GI/Hepatic negative GI ROS, Neg liver ROS,,,  Endo/Other  negative endocrine ROS    Renal/GU negative Renal ROS  negative genitourinary   Musculoskeletal negative musculoskeletal ROS (+)    Abdominal   Peds negative pediatric ROS (+)  Hematology negative hematology ROS (+)   Anesthesia Other Findings   Reproductive/Obstetrics negative OB ROS                             Anesthesia Physical Anesthesia Plan  ASA: 3  Anesthesia Plan: General   Post-op Pain Management:    Induction: Intravenous  PONV Risk Score and Plan: 2 and Ondansetron, Dexamethasone and Treatment may vary due to age or medical condition  Airway Management Planned: Simple Face Mask and Natural Airway  Additional Equipment: None  Intra-op Plan:   Post-operative Plan: Extubation in OR  Informed Consent: I have reviewed the patients History and Physical, chart, labs and discussed the procedure including the risks, benefits and alternatives for the proposed anesthesia with the patient or authorized representative who has indicated his/her understanding and acceptance.     Dental advisory given  Plan Discussed with: CRNA and Anesthesiologist  Anesthesia Plan Comments: (See PAT note  05/17/18, Konrad Felix, PA-C  )         Anesthesia Quick Evaluation

## 2021-11-11 NOTE — H&P (Signed)
Office Visit 10/21/2021 [facility]    Tommye [place], [place] [facility] Paroxysmal atrial fibrillation [facility]) +3 more Dx Referred by [name], MD Reason for Visit   Additional Documentation  Vitals: BP 132/102 Important    Pulse 69   Ht '6\' 3"'$  (1.905 m)   Wt 126.6 kg   SpO2 92%   BMI 34.87 kg/m   BSA 2.59 m  Flowsheets: NEWS,   MEWS Score,   Anthropometrics  Encounter Info: Billing Info,   History,   Allergies,   Detailed Report   All Notes   Progress Notes by Baldwin Jamaica, PA-C at 10/21/2021 8:50 AM  Author: Baldwin Jamaica, PA-C Author Type: Physician Assistant Certified Filed: 44/31/5400  1:32 PM  Note Status: Signed Cosign: Cosign Not Required Encounter Date: 10/21/2021  Editor: Charlynn Grimes (Physician Assistant Certified)                  Cardiology Office Note Date:  10/19/2021  Patient ID:  Brandon Cipollone., DOB [birth year], MRN [id number] PCP:  [name], MD   Electrophysiologist: Dr. Lovena Le     Chief Complaint:  hospital f/u   History of Present Illness: Brandon Pauley. is a 71 y.o. male with history of SVT, AFib, b/l PE (remote, 2014), chronic back pain, HTN, HLD.   He saw Dr. Lovena Le March 2023, discussed unclear if his Afib was 2/2 his SVT or independent of the SVT, they discussed perhaps stopping a/c, pt preferred to continue. Planned for back surgery, felt an acceptable cardiac candidate.   09/26/21 ER visit w/HTN, abdominal /nausea 172/120 in ER Discharged from the ER after replacing K+, given Rx for K+ Perhaps early colitis vs gastroenteritis   Patient reached out 09/29/21 with concerns of HTN and low K+ to be seen.   ER visit 09/29/21 with reports of CP and high BP. 110/70 Labs unrevealing, EKGs felt nonischemic  Symptoms resolved with GI cocktail Discharged from the ER with Rx for carafate and instructions to f/u with cardiology   Returned to  the ER 09/30/21 with ongoing concerns of HTN and potential risk to his kidneys with uncontrolled HTN, as well as N/V. BP in triage was 134/86, he was in rate controlled FAib, repeat BMET Stable with mild hypokalemia of 3.4 The pt was discharged advised to minimize his BP checks at home given anxiety producing   F/u labs ordered via his    9/22 : K+ 2.7 9/24: 3.3 9/26 3.4   09/28/21: HS Trop 15, 12 09/30/21: BUN/Creat 10/0.82   ER visit 10/19/21 Incomplete note: c/o SOB, nausea, dizziness, AFib BP 164/118     TODAY He is somewhat vague as to what he is feeing symptom-wise. Says he would check his BP when he felt "off" or poorly and noted his BP high at these times. He is not certain what the symptom is that provokes him to check his BP, perhaps palpitations, not particularly CP, a sense that his heart is racing maybe. He has not had SOB No near syncope or syncope. He reports that for the most part is good about his medicines, though probably misses a dose of them, including his eliquis "hear and there" or once in a while.   No bleeding or signs of bleeding   He thinks he has been in Afib since his 1st ER visit last month, maybe a few weeks now and this  is the 1st since his ablation years ago. The one thing that has changed for him is that he moved to [employer] retirement and they have happy hour that he started to go to almost nightly and drinks a fair bit. Finally put it together and last had a drink Saturday last weekend.   He reports home BP when he checks to be 140's-160's/100-130, HRs mostly 80's He does not think he is actually taking the amlodipine on his list of meds         Past Medical History:  Diagnosis Date   Anxiety     Arthritis     Benign neoplasm of colon     Benign prostatic hyperplasia with urinary obstruction     Bilateral inguinal hernia 05/29/2011   Bilateral pulmonary embolism ([place]) 3/14    admitted to [facility],  treated with Xarelto   Breast lump  03/29/2019   Calculus of kidney 10/21/2016   Cataract     Chronic bilateral low back pain with sciatica 03/21/2012   Chronic pain     Finger mass, right 10/17/2020   History of alcohol abuse 03/07/2016   History of colon polyps 11/18/2015   History of kidney stones     History of pulmonary embolism 03/19/2012   Hyperlipemia     Hypertension     Insomnia     Insomnia due to medical condition 12/23/2016    Polyuria secondary to BPH   Major depressive disorder, recurrent episode ([employer])     Mitral regurgitation 04/24/2013    Mild by TEE   Morbid obesity ([place]) 03/07/2016   Nonrheumatic mitral valve insufficiency 04/25/2013    Mild by TEE  Overview:  Overview:  Mild by TEE Overview:  Overview:  Overview:  Mild by TEE Overview:  Overview:  Mild by TEE   Obesity     OSA (obstructive sleep apnea)      noncompliant with CPAP.  07/27/13- awaiting a CPAP- unable to tolerate mask   Osteoporosis     Persistent atrial fibrillation (HCC)     Recurrent unilateral inguinal hernia 07/17/2013   Restless leg syndrome      takes gabapentin   S/P right total knee arthroplasty 05/24/2018   Sciatica     Spinal stenosis, lumbar region with neurogenic claudication     SVT (supraventricular tachycardia) ([place]) 03/07/2016   Thrombus of left atrial appendage 08/08/1515   Uncomplicated asthma 06/21/735    Overview:  Overview:  Overview:  Overview:  Qualifier: Diagnosis of  By: Sherren Mocha MD, Jory Ee Overview:  Overview:  Qualifier: Diagnosis of  By: Sherren Mocha MD, Jory Ee   Ventral hernia, unspecified, without mention of obstruction or gangrene      right abdominal wall           Past Surgical History:  Procedure Laterality Date   CARDIOVERSION N/A 03/21/2012    Procedure: CARDIOVERSION;  Surgeon: Birdie Riddle, MD;  Location: [place];  Service: Cardiovascular;  Laterality: N/A;   CARDIOVERSION N/A 04/24/2013    Procedure: CARDIOVERSION;  Surgeon: Dorothy Spark, MD;  Location: [place];  Service: Cardiovascular;   Laterality: N/A;   CARDIOVERSION N/A 06/27/2015    Procedure: CARDIOVERSION;  Surgeon: Larey Dresser, MD;  Location: [place];  Service: Cardiovascular;  Laterality: N/A;   CATARACT EXTRACTION       COLONOSCOPY W/ POLYPECTOMY       ELECTROPHYSIOLOGIC STUDY N/A 07/30/2015    Procedure: Atrial Fibrillation Ablation;  Surgeon: Thompson Grayer, MD;  Location: [place] [facility];  Service: Cardiovascular;  Laterality: N/A;   EXTRACORPOREAL SHOCK WAVE LITHOTRIPSY Right 10/29/2016    Procedure: RIGHT EXTRACORPOREAL SHOCK WAVE LITHOTRIPSY (ESWL);  Surgeon: Alexis Frock, MD;  Location: [facility];  Service: Urology;  Laterality: Right;   EYE SURGERY Right      cataract   FEMUR FRACTURE SURGERY       HERNIA REPAIR   07/06/2011   INGUINAL HERNIA REPAIR Right 07/28/2013    Procedure: RIGHT INGUINAL HERNIA REPAIR;  Surgeon: Imogene Burn. Georgette Dover, MD;  Location: [place];  Service: General;  Laterality: Right;   INGUINAL HERNIA REPAIR Right 09/22/2016    Procedure: LAPAROSCOPIC RIGHT INGUINAL HERNIA;  Surgeon: Kieth Brightly Arta Bruce, MD;  Location: [facility];  Service: General;  Laterality: Right;  With MESH   INSERTION OF MESH Right 07/28/2013    Procedure: INSERTION OF MESH;  Surgeon: Imogene Burn. Georgette Dover, MD;  Location: [place];  Service: General;  Laterality: Right;   KNEE ARTHROSCOPY        left   REPLACEMENT TOTAL KNEE Left     ROTATOR CUFF REPAIR        right   ROTATOR CUFF REPAIR Left 03/08/2014    DR SUPPLE   SHOULDER ARTHROSCOPY WITH ROTATOR CUFF REPAIR AND SUBACROMIAL DECOMPRESSION Left 03/08/2014    Procedure: LEFT SHOULDER ARTHROSCOPY WITH ROTATOR CUFF REPAIR/SUBACROMIAL DECOMPRESSION/DISTAL CLAVICLE RESECTION;  Surgeon: Marin Shutter, MD;  Location: [place];  Service: Orthopedics;  Laterality: Left;   SVT ABLATION N/A 01/23/2019    Procedure: SVT ABLATION;  Surgeon: Evans Lance, MD;  Location: [place] [facility];  Service: Cardiovascular;  Laterality: N/A;   TEE WITHOUT CARDIOVERSION N/A 03/21/2012     Procedure: TRANSESOPHAGEAL ECHOCARDIOGRAM (TEE);  Surgeon: Birdie Riddle, MD;  Location: [place];  Service: Cardiovascular;  Laterality: N/A;   TEE WITHOUT CARDIOVERSION N/A 04/24/2013    Procedure: TRANSESOPHAGEAL ECHOCARDIOGRAM (TEE);  Surgeon: Dorothy Spark, MD;  Location: [place];  Service: Cardiovascular;  Laterality: N/A;   TEE WITHOUT CARDIOVERSION N/A 07/29/2015    Procedure: TRANSESOPHAGEAL ECHOCARDIOGRAM (TEE);  Surgeon: Fay Records, MD;  Location: [place];  Service: Cardiovascular;  Laterality: N/A;   TONSILLECTOMY       TOTAL KNEE ARTHROPLASTY Right 05/24/2018    Procedure: RIGHT TOTAL KNEE ARTHROPLASTY;  Surgeon: Paralee Cancel, MD;  Location[facility];  Service: Orthopedics;  Laterality: Right;  70 mins            Current Outpatient Medications  Medication Sig Dispense Refill   amLODipine (NORVASC) 5 MG tablet TAKE 1 TABLET BY MOUTH EVERY DAY (Patient taking differently: Take 5 mg by mouth every evening.) 90 tablet 3   atorvastatin (LIPITOR) 40 MG tablet Take 1 tablet (40 mg total) by mouth daily. 90 tablet 3   carvedilol (COREG) 25 MG tablet Take 1 tablet (25 mg total) by mouth 2 (two) times daily with a meal. 180 tablet 3   dutasteride (AVODART) 0.5 MG capsule Take 0.5 mg by mouth daily.       ELIQUIS 5 MG TABS tablet TAKE 1 TABLET BY MOUTH TWICE A DAY (Patient taking differently: Take 5 mg by mouth 2 (two) times daily.) 60 tablet 5   escitalopram (LEXAPRO) 20 MG tablet Take 1 tablet (20 mg total) by mouth daily. 90 tablet 1   fluticasone (FLONASE) 50 MCG/ACT nasal spray Place 2 sprays into both nostrils daily. (Patient taking differently: Place 2 sprays into both nostrils daily as needed for allergies.)  48 g 1   gabapentin (NEURONTIN) 800 MG tablet Take 1 tablet (800 mg total) by mouth 3 (three) times daily. 270 tablet 1   irbesartan (AVAPRO) 300 MG tablet Take 1 tablet (300 mg total) by mouth daily. 90 tablet 1   lubiprostone (AMITIZA) 24 MCG capsule TAKE 1  CAPSULE (24 MCG TOTAL) BY MOUTH 2 (TWO) TIMES DAILY WITH A MEAL. 180 capsule 0   Magnesium Gluconate 550 MG TABS TAKE 1 TABLET BY MOUTH 2 TIMES DAILY. 60 tablet 0   memantine (NAMENDA) 5 MG tablet TAKE 1 TABLET BY MOUTH TWICE A DAY 180 tablet 1   MYRBETRIQ 25 MG TB24 tablet Take 25 mg by mouth daily.       ondansetron (ZOFRAN) 4 MG tablet Take 1 tablet (4 mg total) by mouth every 6 (six) hours. 12 tablet 0   potassium chloride SA (KLOR-CON M) 20 MEQ tablet Take 1 tablet (20 mEq total) by mouth 2 (two) times daily for 10 days. 20 tablet 0   spironolactone (ALDACTONE) 25 MG tablet Take 1 tablet (25 mg total) by mouth daily. 90 tablet 0   sucralfate (CARAFATE) 1 g tablet Take 1 tablet (1 g total) by mouth with breakfast, with lunch, and with evening meal for 7 days. 21 tablet 0    No current facility-administered medications for this visit.      Allergies:   Ace inhibitors and Albuterol    Social History:  The patient  reports that he quit smoking about 47 years ago. His smoking use included cigarettes. He has a 2.50 pack-year smoking history. He has never used smokeless tobacco. He reports that he does not drink alcohol and does not use drugs.    Family History:  The patient's family history includes Asthma in his mother; CVA in an other family member; Cancer in his father and paternal grandmother; Diabetes in an other family member; Heart failure in his mother; Hypertension in his mother.   ROS:  Please see the history of present illness.    All other systems are reviewed and otherwise negative.    PHYSICAL EXAM:  VS:  There were no vitals taken for this visit. BMI: There is no height or weight on file to calculate BMI. Well nourished, well developed, in no acute distress HEENT: normocephalic, atraumatic Neck: no JVD, carotid bruits or masses Cardiac:  irreg-irreg; no significant murmurs, no rubs, or gallops Lungs:   CTA b/l, no wheezing, rhonchi or rales Abd: soft, nontender MS: no  deformity or atrophy Ext: no edema Skin: warm and dry, no rash Neuro:  No gross deficits appreciated Psych: euthymic mood, full affect     EKG:  Done today and reviewed by myself shows  AFib 75bpm, no changed from last, no acute/ischemic changes   01/23/2019: EPS/ablation Conclusion: Successful EP study and catheter ablation utilizing three-dimensional electroanatomic mapping of inducible AV node reentrant tachycardia.  Following catheter ablation, there was no inducible SVT, no AH jumps, and no echo beats.     09/24/2016: TTE Study Conclusions  - Left ventricle: The cavity size was normal. Systolic function was    normal. The estimated ejection fraction was in the range of 60%    to 65%. Wall motion was normal; there were no regional wall    motion abnormalities. Doppler parameters are consistent with    abnormal left ventricular relaxation (grade 1 diastolic    dysfunction). There was no evidence of elevated ventricular    filling pressure by Doppler parameters.  -  Aortic valve: Trileaflet; normal thickness leaflets. There was no    regurgitation.  - Mitral valve: There was trivial regurgitation.  - Right ventricle: The cavity size was normal. Wall thickness was    normal. Systolic function was normal.  - Tricuspid valve: There was no regurgitation.  - Pulmonary arteries: Systolic pressure could not be accurately    estimated.  - Inferior vena cava: The vessel was normal in size.  - Pericardium, extracardiac: There was no pericardial effusion.     03/22/2016: stress myoview Nuclear stress EF: 53%. There was no ST segment deviation noted during stress. Defect 1: There is a large defect of severe severity present in the basal inferolateral, mid inferolateral and apex location. This is a low risk study. The left ventricular ejection fraction is mildly decreased (45-54%).   Low risk stress nuclear study with large prior inferolateral infarct and mild peri-infarct ischemia; EF 53  with inferolateral hypokinesis; mild LVE.   Recent Labs: 02/03/2021: TSH 2.01 07/09/2021: Magnesium 2.0 09/26/2021: ALT 9 09/28/2021: Hemoglobin 13.6; Platelets 144 09/30/2021: BUN 10; Creatinine, Ser 0.82; Potassium 3.4; Sodium 142  02/03/2021: Cholesterol 135; HDL 32.80; LDL Cholesterol 80; Total CHOL/HDL Ratio 4; Triglycerides 114.0; VLDL 22.8    CrCl cannot be calculated (Unknown ideal weight.).       Wt Readings from Last 3 Encounters:  09/28/21 285 lb (129.3 kg)  09/26/21 277 lb (125.6 kg)  07/24/21 277 lb (125.6 kg)      Other studies reviewed: Additional studies/records reviewed today include: summarized above   ASSESSMENT AND PLAN:   SVT S/p AVNRT ablation 2021   Paroxysmal AFib CHA2DS2Vasc is 5, on Eliquis, appropriately dosed Feels he has been in Afib a few weeks now, the 1st in years Rate controlled   Discussed importance of not missing his Eliquis doses Plan DCCV in 3-4 weeks, discussed procedure, he is agreeable Plan an echo, monitoring   Urged he continue to stay away from ETOH   HTN He will call to report wether or not he has amlodipine/if he is is not taking it and we will make BP recommendations pending that. May benefit from RPH/BP clinic   4. CP He was getting ready to leave when he reported to the MA that he was having CP EKG was done (as above) No ischemic changes BP stable 142/100, sats 98% He describe a pulsating type sensation in low chest/upper abdomen, no associated symptoms Perhaps stabbing/poking repetitively and as fast as they started > they stopped. Sounds atypical Has had a number of ER visit with neg HS Trops D/w DOD, agrees sounds atypical I will plan for lexiscan stress test, he is agreeable       Disposition: F/u with [place] in 2 mo, sooner if needed   Current medicines are reviewed at length with the patient today.  The patient did not have any concerns regarding medicines.   Venetia Night, PA-C 10/19/2021 2:15 PM      [facility] [address] [place] [address] [phone] (office)  [phone] (fax)      For DCCV; compliant with apixaban; no changes. Kirk Ruths

## 2021-11-11 NOTE — Anesthesia Postprocedure Evaluation (Signed)
Anesthesia Post Note  Patient: Brandon [name].  Procedure(s) Performed: CARDIOVERSION     Patient location during evaluation: PACU Anesthesia Type: General Level of consciousness: awake and alert Pain management: pain level controlled Vital Signs Assessment: post-procedure vital signs reviewed and stable Respiratory status: spontaneous breathing, nonlabored ventilation, respiratory function stable and patient connected to nasal cannula oxygen Cardiovascular status: blood pressure returned to baseline and stable Postop Assessment: no apparent nausea or vomiting Anesthetic complications: no   No notable events documented.  Last Vitals:  Vitals:   11/11/21 0925 11/11/21 0934  BP: (!) 143/95 (!) 144/87  Pulse: 83   Resp: 15 19  Temp:    SpO2: 91% 95%    Last Pain:  Vitals:   11/11/21 0934  TempSrc:   PainSc: 0-No pain                 Mellissa Conley

## 2021-11-11 NOTE — Transfer of Care (Signed)
Immediate Anesthesia Transfer of Care Note  Patient: Brandon B Alberts Jr.  Procedure(s) Performed: CARDIOVERSION  Patient Location: PACU  Anesthesia Type:General  Level of Consciousness: drowsy and patient cooperative  Airway & Oxygen Therapy: Patient Spontanous Breathing and Patient connected to nasal cannula oxygen  Post-op Assessment: Report given to RN, Post -op Vital signs reviewed and stable, and Patient moving all extremities  Post vital signs: Reviewed and stable  Last Vitals:  Vitals Value Taken Time  BP    Temp    Pulse 76 11/11/21 0908  Resp 15 11/11/21 0908  SpO2 94 % 11/11/21 0908  Vitals shown include unvalidated device data.  Last Pain:  Vitals:   11/11/21 0755  TempSrc: Temporal  PainSc: 4          Complications: No notable events documented.

## 2021-11-11 NOTE — Anesthesia Procedure Notes (Signed)
Procedure Name: General with mask airway Date/Time: 11/11/2021 9:00 AM  Performed by: Amadeo Garnet, CRNAPre-anesthesia Checklist: Patient identified, Emergency Drugs available, Suction available and Patient being monitored Patient Re-evaluated:Patient Re-evaluated prior to induction Oxygen Delivery Method: Ambu bag Preoxygenation: Pre-oxygenation with 100% oxygen Induction Type: IV induction Placement Confirmation: positive ETCO2 Dental Injury: Teeth and Oropharynx as per pre-operative assessment

## 2021-11-11 NOTE — Discharge Instructions (Signed)

## 2021-11-11 NOTE — Procedures (Signed)
Electrical Cardioversion Procedure Note Brandon [name] [id number] 02/19/1950  Procedure: Electrical Cardioversion Indications:  Atrial Fibrillation  Procedure Details Consent: Risks of procedure as well as the alternatives and risks of each were explained to the (patient/caregiver).  Consent for procedure obtained. Time Out: Verified patient identification, verified procedure, site/side was marked, verified correct patient position, special equipment/implants available, medications/allergies/relevent history reviewed, required imaging and test results available.  Performed  Patient placed on cardiac monitor, pulse oximetry, supplemental oxygen as necessary.  Sedation given:  Pt sedated by anesthesia with lidocaine 100 mg and diprovan 130 mg IV. Pacer pads placed anterior and posterior chest.  Cardioverted 3 time(s).  Cardioverted at 200J x 3 (second and third attempt with manual pressure) all unsuccessful.  Evaluation Findings: Post procedure EKG shows: atrial fibrillation Complications: None Patient did tolerate procedure well.  FU in clinic for consideration of antiarrhythmic with repeat attempt   Kirk Ruths 11/11/2021, 8:07 AM

## 2021-11-11 NOTE — Interval H&P Note (Signed)
History and Physical Interval Note:  11/11/2021 8:09 AM  Brandon B Everage Jr.  has presented today for surgery, with the diagnosis of atrial fibrilation.  The various methods of treatment have been discussed with the patient and family. After consideration of risks, benefits and other options for treatment, the patient has consented to  Procedure(s): CARDIOVERSION (N/A) as a surgical intervention.  The patient's history has been reviewed, patient examined, no change in status, stable for surgery.  I have reviewed the patient's chart and labs.  Questions were answered to the patient's satisfaction.     Kirk Ruths

## 2021-11-14 ENCOUNTER — Encounter (HOSPITAL_COMMUNITY): Payer: Self-pay | Admitting: Physician Assistant

## 2021-11-14 ENCOUNTER — Encounter (HOSPITAL_COMMUNITY): Payer: Self-pay | Admitting: Cardiology

## 2021-11-17 DIAGNOSIS — I480 Paroxysmal atrial fibrillation: Secondary | ICD-10-CM | POA: Diagnosis not present

## 2021-12-01 ENCOUNTER — Encounter: Payer: Self-pay | Admitting: Internal Medicine

## 2021-12-01 ENCOUNTER — Telehealth: Payer: Self-pay | Admitting: Internal Medicine

## 2021-12-01 NOTE — Telephone Encounter (Signed)
LVM for pt to rtn my call to schedule AWV-I with [facility] call back # [phone] 

## 2021-12-02 NOTE — Progress Notes (Signed)
PCP:  [name], MD Primary Cardiologist: [name], MD Electrophysiologist: [name], MD   Brandon [name]. is a 71 y.o. male seen today for [name], MD for routine electrophysiology followup. Since last being seen in our clinic the patient reports doing about the same. Seen by Joseph Art 10/17 and was scheduled for [facility].   Per procedure notes, attempted [facility] x 3, (twice with manual pressure) which were unsuccessful. No sinus beats mentioned.   Remains non-specific about his symptoms. Trying to cut back on drinking, but still having approx 0.5 bottle of wine nightly. Overall has fatigue thinks is correlated. No obvious SOB. Feels "off"   Past Medical History:  Diagnosis Date   Anxiety    Arthritis    Benign neoplasm of colon    Benign prostatic hyperplasia with urinary obstruction    Bilateral inguinal hernia 05/29/2011   Bilateral pulmonary embolism ([place]) 3/14   admitted to [facility],  treated with Xarelto   Breast lump 03/29/2019   Calculus of kidney 10/21/2016   Cataract    Chronic bilateral low back pain with sciatica 03/21/2012   Chronic pain    Finger mass, right 10/17/2020   History of alcohol abuse 03/07/2016   History of colon polyps 11/18/2015   History of kidney stones    History of pulmonary embolism 03/19/2012   Hyperlipemia    Hypertension    Insomnia    Insomnia due to medical condition 12/23/2016   Polyuria secondary to BPH   Major depressive disorder, recurrent episode ([place])    Mitral regurgitation 04/24/2013   Mild by TEE   Morbid obesity ([place]) 03/07/2016   Nonrheumatic mitral valve insufficiency 04/25/2013   Mild by TEE  Overview:  Overview:  Mild by TEE Overview:  Overview:  Overview:  Mild by TEE Overview:  Overview:  Mild by TEE   Obesity    OSA (obstructive sleep apnea)    noncompliant with CPAP.  07/27/13- awaiting a CPAP- unable to tolerate mask   Osteoporosis    Persistent atrial fibrillation (HCC)    Recurrent unilateral inguinal hernia  07/17/2013   Restless leg syndrome    takes gabapentin   S/P right total knee arthroplasty 05/24/2018   Sciatica    Spinal stenosis, lumbar region with neurogenic claudication    SVT (supraventricular tachycardia) 03/07/2016   Thrombus of left atrial appendage 0/05/3974   Uncomplicated asthma 7/34/1937   Overview:  Overview:  Overview:  Overview:  Qualifier: Diagnosis of  By: Sherren Mocha MD, Jory Ee Overview:  Overview:  Qualifier: Diagnosis of  By: Sherren Mocha MD, Jory Ee   Ventral hernia, unspecified, without mention of obstruction or gangrene    right abdominal wall   Past Surgical History:  Procedure Laterality Date   CARDIOVERSION N/A 03/21/2012   Procedure: CARDIOVERSION;  Surgeon: Birdie Riddle, MD;  Location: [place];  Service: Cardiovascular;  Laterality: N/A;   CARDIOVERSION N/A 04/24/2013   Procedure: CARDIOVERSION;  Surgeon: Dorothy Spark, MD;  Location: [place];  Service: Cardiovascular;  Laterality: N/A;   CARDIOVERSION N/A 06/27/2015   Procedure: CARDIOVERSION;  Surgeon: Larey Dresser, MD;  Location: [place];  Service: Cardiovascular;  Laterality: N/A;   CARDIOVERSION N/A 11/11/2021   Procedure: CARDIOVERSION;  Surgeon: Lelon Perla, MD;  Location: [place];  Service: Cardiovascular;  Laterality: N/A;   CATARACT EXTRACTION     COLONOSCOPY W/ POLYPECTOMY     ELECTROPHYSIOLOGIC STUDY N/A 07/30/2015   Procedure: Atrial Fibrillation Ablation;  Surgeon: Jeneen Rinks  Allred, MD;  Location: [place] [facility];  Service: Cardiovascular;  Laterality: N/A;   EXTRACORPOREAL SHOCK WAVE LITHOTRIPSY Right 10/29/2016   Procedure: RIGHT EXTRACORPOREAL SHOCK WAVE LITHOTRIPSY (ESWL);  Surgeon: Alexis Frock, MD;  Location: [facility];  Service: Urology;  Laterality: Right;   EYE SURGERY Right    cataract   FEMUR FRACTURE SURGERY     HERNIA REPAIR  07/06/2011   INGUINAL HERNIA REPAIR Right 07/28/2013   Procedure: RIGHT INGUINAL HERNIA REPAIR;  Surgeon: Imogene Burn. Georgette Dover, MD;  Location:  [place];  Service: General;  Laterality: Right;   INGUINAL HERNIA REPAIR Right 09/22/2016   Procedure: LAPAROSCOPIC RIGHT INGUINAL HERNIA;  Surgeon: Kieth Brightly Arta Bruce, MD;  Location: [facility];  Service: General;  Laterality: Right;  With MESH   INSERTION OF MESH Right 07/28/2013   Procedure: INSERTION OF MESH;  Surgeon: Imogene Burn. Georgette Dover, MD;  Location: [place];  Service: General;  Laterality: Right;   KNEE ARTHROSCOPY     left   REPLACEMENT TOTAL KNEE Left    ROTATOR CUFF REPAIR     right   ROTATOR CUFF REPAIR Left 03/08/2014   DR SUPPLE   SHOULDER ARTHROSCOPY WITH ROTATOR CUFF REPAIR AND SUBACROMIAL DECOMPRESSION Left 03/08/2014   Procedure: LEFT SHOULDER ARTHROSCOPY WITH ROTATOR CUFF REPAIR/SUBACROMIAL DECOMPRESSION/DISTAL CLAVICLE RESECTION;  Surgeon: Marin Shutter, MD;  Location: [place];  Service: Orthopedics;  Laterality: Left;   SVT ABLATION N/A 01/23/2019   Procedure: SVT ABLATION;  Surgeon: Evans Lance, MD;  Location: [place] [facility];  Service: Cardiovascular;  Laterality: N/A;   TEE WITHOUT CARDIOVERSION N/A 03/21/2012   Procedure: TRANSESOPHAGEAL ECHOCARDIOGRAM (TEE);  Surgeon: Birdie Riddle, MD;  Location: [place];  Service: Cardiovascular;  Laterality: N/A;   TEE WITHOUT CARDIOVERSION N/A 04/24/2013   Procedure: TRANSESOPHAGEAL ECHOCARDIOGRAM (TEE);  Surgeon: Dorothy Spark, MD;  Location: [place];  Service: Cardiovascular;  Laterality: N/A;   TEE WITHOUT CARDIOVERSION N/A 07/29/2015   Procedure: TRANSESOPHAGEAL ECHOCARDIOGRAM (TEE);  Surgeon: Fay Records, MD;  Location: [place];  Service: Cardiovascular;  Laterality: N/A;   TONSILLECTOMY     TOTAL KNEE ARTHROPLASTY Right 05/24/2018   Procedure: RIGHT TOTAL KNEE ARTHROPLASTY;  Surgeon: Paralee Cancel, MD;  Location: [facility];  Service: Orthopedics;  Laterality: Right;  70 mins    Current Outpatient Medications  Medication Sig Dispense Refill   amLODipine (NORVASC) 5 MG tablet Take 1 tablet (5 mg total) by mouth  daily. 90 tablet 3   apixaban (ELIQUIS) 5 MG TABS tablet TAKE 1 TABLET BY MOUTH TWICE A DAY 60 tablet 5   atorvastatin (LIPITOR) 40 MG tablet Take 1 tablet (40 mg total) by mouth daily. 90 tablet 3   carvedilol (COREG) 25 MG tablet Take 1 tablet (25 mg total) by mouth 2 (two) times daily with a meal. 180 tablet 3   diphenhydramine-acetaminophen (TYLENOL PM) 25-500 MG TABS tablet Take 2 tablets by mouth at bedtime as needed (sleep).     dutasteride (AVODART) 0.5 MG capsule Take 0.5 mg by mouth daily.     escitalopram (LEXAPRO) 20 MG tablet Take 1 tablet (20 mg total) by mouth daily. 90 tablet 1   fluticasone (FLONASE) 50 MCG/ACT nasal spray Place 2 sprays into both nostrils daily. (Patient taking differently: Place 2 sprays into both nostrils daily as needed for allergies.) 48 g 1   gabapentin (NEURONTIN) 800 MG tablet Take 1 tablet (800 mg total) by mouth 3 (three) times daily. 270 tablet 1   irbesartan (AVAPRO)  300 MG tablet Take 1 tablet (300 mg total) by mouth daily. 90 tablet 1   memantine (NAMENDA) 5 MG tablet TAKE 1 TABLET BY MOUTH TWICE A DAY 180 tablet 1   spironolactone (ALDACTONE) 25 MG tablet TAKE 1 TABLET (25 MG TOTAL) BY MOUTH DAILY. 90 tablet 0   tamsulosin (FLOMAX) 0.4 MG CAPS capsule Take 0.4 mg by mouth 2 (two) times daily.     triamcinolone cream (KENALOG) 0.1 % Apply 1 Application topically 2 (two) times daily as needed (rash).     potassium chloride SA (KLOR-CON M) 20 MEQ tablet Take 1 tablet (20 mEq total) by mouth 2 (two) times daily for 10 days. 20 tablet 0   sucralfate (CARAFATE) 1 g tablet Take 1 tablet (1 g total) by mouth with breakfast, with lunch, and with evening meal for 7 days. (Patient not taking: Reported on 11/10/2021) 21 tablet 0   No current facility-administered medications for this visit.    Allergies  Allergen Reactions   Ace Inhibitors Cough and Other (See Comments)   Albuterol Other (See Comments)    Racing heart    Social History   Socioeconomic  History   Marital status: Married    Spouse name: Not on file   Number of children: Not on file   Years of education: Not on file   Highest education level: Not on file  Occupational History   Not on file  Tobacco Use   Smoking status: Former    Packs/day: 0.50    Years: 5.00    Total pack years: 2.50    Types: Cigarettes    Quit date: 78    Years since quitting: 47.9   Smokeless tobacco: Never  Vaping Use   Vaping Use: Never used  Substance and Sexual Activity   Alcohol use: No    Comment: Former EtOH abuse, stopped 06/2016   Drug use: No    Comment: negative hx for IV drug abuse   Sexual activity: Not on file  Other Topics Concern   Not on file  Social History Narrative   Lives with wife in [place]   Retired Dietitian   Wife lives in town (still married but lives separate)   Has biological daughter in [place] ([place])   Social Determinants of Health   Financial Resource Strain: Not on file  Food Insecurity: Not on file  Transportation Needs: Not on file  Physical Activity: Not on file  Stress: Not on file  Social Connections: Not on file  Intimate Partner Violence: Not on file     Review of Systems: All other systems reviewed and are otherwise negative except as noted above.  Physical Exam: Vitals:   12/09/21 0948  BP: 138/84  Pulse: 96  SpO2: 97%  Weight: 271 lb 9.6 oz (123.2 kg)  Height: '6\' 4"'$  (1.93 m)    GEN- The patient is well appearing, alert and oriented x 3 today.   HEENT: normocephalic, atraumatic; sclera clear, conjunctiva pink; hearing intact; oropharynx clear; neck supple, no JVP Lymph- no cervical lymphadenopathy Lungs- Clear to ausculation bilaterally, normal work of breathing.  No wheezes, rales, rhonchi Heart- Irregularly irregular rate and rhythm, no murmurs, rubs or gallops, PMI not laterally displaced GI- soft, non-tender, non-distended, bowel sounds present, no hepatosplenomegaly Extremities- No peripheral  edema. no clubbing or cyanosis; DP/PT/radial pulses 2+ bilaterally MS- no significant deformity or atrophy Skin- warm and dry, no rash or lesion Psych- euthymic mood, full affect Neuro- strength  and sensation are intact  EKG is ordered. Personal review of EKG from today shows atrial fibrillation at 96 bpm  Additional studies reviewed include: Previous EP notes.   GATED SPECT MYO PERF W/LEXISCAN STRESS 1D 10/30/2021  Narrative   LV perfusion is normal. There is no evidence of ischemia. There is no evidence of infarction. Diaphragm attenuation noted.   Left ventricular function is normal. Nuclear stress EF: 55 %. The left ventricular ejection fraction is normal (55-65%). End diastolic cavity size is mildly enlarged.   The study is normal. The study is low risk.   LONG TERM MONITOR (8-14 DAYS) INTERPRETATION 11/17/2021 Atrial fib with a slow VR, CVR and RVR NS VT, one run for 16 beats No prolonged pauses Frequent PVC's   Assessment and Plan:  SVT S/p AVNRT ablation 2021   Paroxysmal AFib EKG today shows atrial fibrillation at 96 Monitor showed persistent AF for the duration.  CHA2DS2Vasc is 5, on Eliquis, appropriately dosed. No missed doses Rate controlled Echo 12/08/2021 LVEF 55-60%, LAE severely dilated Discussed options including rate control, AAD (most likely to benefit from amiodarone or tikosyn given history), or appt for consideration of redo ablation.  Was on amiodarone distantly and appears to have tolerated well.    3. HTN Stable on current regimen    4. CP Atypical chest pain.  Normal Myoview 10/2021. Has been seen in ER previously with neg HS trops.   Follow up with Dr. Myles Gip for re-do ablation consideration. If not good candidate, likely tikosyn or amiodarone are best options for AAD. (He does have a distant history of bradycardia into the 40s.)  Shirley Friar, PA-C  12/09/21 9:51 AM

## 2021-12-03 ENCOUNTER — Other Ambulatory Visit: Payer: Self-pay | Admitting: Internal Medicine

## 2021-12-03 DIAGNOSIS — E876 Hypokalemia: Secondary | ICD-10-CM

## 2021-12-03 DIAGNOSIS — E269 Hyperaldosteronism, unspecified: Secondary | ICD-10-CM

## 2021-12-03 NOTE — Telephone Encounter (Signed)
Prescription refill request for Eliquis received. Indication: APF Last office visit: 11/21/21  R Ursuy PA-C Scr: 0.8 on 11/11/21 Age: 71 Weight: 126.6kg   Based on above findings Eliquis '5mg'$  twice daily is the appropriate dose.  Refill approved.

## 2021-12-08 ENCOUNTER — Ambulatory Visit (HOSPITAL_COMMUNITY): Payer: Medicare Other | Attending: Physician Assistant

## 2021-12-08 DIAGNOSIS — I480 Paroxysmal atrial fibrillation: Secondary | ICD-10-CM | POA: Insufficient documentation

## 2021-12-08 LAB — ECHOCARDIOGRAM COMPLETE: S' Lateral: 4.2 cm

## 2021-12-09 ENCOUNTER — Encounter: Payer: Self-pay | Admitting: Student

## 2021-12-09 ENCOUNTER — Ambulatory Visit: Payer: Medicare Other | Attending: Student | Admitting: Student

## 2021-12-09 VITALS — BP 138/84 | HR 96 | Ht 76.0 in | Wt 271.6 lb

## 2021-12-09 DIAGNOSIS — I4819 Other persistent atrial fibrillation: Secondary | ICD-10-CM | POA: Diagnosis not present

## 2021-12-09 DIAGNOSIS — I10 Essential (primary) hypertension: Secondary | ICD-10-CM | POA: Diagnosis not present

## 2021-12-09 NOTE — Patient Instructions (Signed)
Medication Instructions:  Your physician recommends that you continue on your current medications as directed. Please refer to the Current Medication list given to you today.  *If you need a refill on your cardiac medications before your next appointment, please call your pharmacy*   Lab Work: None If you have labs (blood work) drawn today and your tests are completely normal, you will receive your results only by: [place] (if you have MyChart) OR A paper copy in the mail If you have any lab test that is abnormal or we need to change your treatment, we will call you to review the results.   Follow-Up: At [facility], you and your health needs are our priority.  As part of our continuing mission to provide you with exceptional heart care, we have created designated Provider Care Teams.  These Care Teams include your primary Cardiologist (physician) and Advanced Practice Providers (APPs -  Physician Assistants and Nurse Practitioners) who all work together to provide you with the care you need, when you need it.  Your next appointment:   As scheduled with Dr. Myles Gip  Important Information About Sugar

## 2021-12-09 NOTE — Progress Notes (Unsigned)
Subjective:   Brandon [name]. is a 71 y.o. male who presents for an Initial Medicare Annual Wellness Visit. I connected with  Brandon B Kucher Jr. on 12/10/21 by a audio enabled telemedicine application and verified that I am speaking with the correct person using two identifiers.  Patient Location: Home  Provider Location: Home Office  I discussed the limitations of evaluation and management by telemedicine. The patient expressed understanding and agreed to proceed.  Review of Systems    Deferred to PCP Cardiac Risk Factors include: dyslipidemia;hypertension;male gender;obesity (BMI >30kg/m2);sedentary lifestyle;advanced age (>40mn, >>60women)     Objective:    There were no vitals filed for this visit. There is no height or weight on file to calculate BMI.     12/10/2021   10:56 AM 11/11/2021    7:56 AM 10/19/2021    6:20 PM 09/28/2021   11:25 PM 09/26/2021    5:29 PM 06/22/2021    3:00 PM 06/04/2021    6:00 PM  Advanced Directives  Does Patient Have a Medical Advance Directive? _0  No No  Would patient like information on creating a medical advance directive? No - Patient declined No - Patient declined  No - Patient declined  No - Patient declined No - Patient declined    Current Medications (verified) Outpatient Encounter Medications as of 12/10/2021  Medication Sig   amLODipine (NORVASC) 5 MG tablet Take 1 tablet (5 mg total) by mouth daily.   apixaban (ELIQUIS) 5 MG TABS tablet TAKE 1 TABLET BY MOUTH TWICE A DAY   atorvastatin (LIPITOR) 40 MG tablet Take 1 tablet (40 mg total) by mouth daily.   carvedilol (COREG) 25 MG tablet Take 1 tablet (25 mg total) by mouth 2 (two) times daily with a meal.   diphenhydramine-acetaminophen (TYLENOL PM) 25-500 MG TABS tablet Take 2 tablets by mouth at bedtime as needed (sleep).   dutasteride (AVODART) 0.5 MG capsule Take 0.5 mg by mouth daily.   escitalopram (LEXAPRO) 20 MG tablet Take 1 tablet (20 mg total) by mouth daily.    fluticasone (FLONASE) 50 MCG/ACT nasal spray Place 2 sprays into both nostrils daily. (Patient taking differently: Place 2 sprays into both nostrils daily as needed for allergies.)   gabapentin (NEURONTIN) 800 MG tablet Take 1 tablet (800 mg total) by mouth 3 (three) times daily.   irbesartan (AVAPRO) 300 MG tablet Take 1 tablet (300 mg total) by mouth daily.   memantine (NAMENDA) 5 MG tablet TAKE 1 TABLET BY MOUTH TWICE A DAY   spironolactone (ALDACTONE) 25 MG tablet TAKE 1 TABLET (25 MG TOTAL) BY MOUTH DAILY.   tamsulosin (FLOMAX) 0.4 MG CAPS capsule Take 0.4 mg by mouth 2 (two) times daily.   triamcinolone cream (KENALOG) 0.1 % Apply 1 Application topically 2 (two) times daily as needed (rash).   potassium chloride SA (KLOR-CON M) 20 MEQ tablet Take 1 tablet (20 mEq total) by mouth 2 (two) times daily for 10 days.   sucralfate (CARAFATE) 1 g tablet Take 1 tablet (1 g total) by mouth with breakfast, with lunch, and with evening meal for 7 days. (Patient not taking: Reported on 11/10/2021)   No facility-administered encounter medications on file as of 12/10/2021.    Allergies (verified) Ace inhibitors and Albuterol   History: Past Medical History:  Diagnosis Date   Anxiety    Arthritis    Benign neoplasm of colon    Benign prostatic hyperplasia with urinary obstruction  Bilateral inguinal hernia 05/29/2011   Bilateral pulmonary embolism ([place]) 3/14   admitted to [facility],  treated with Xarelto   Breast lump 03/29/2019   Calculus of kidney 10/21/2016   Cataract    Chronic bilateral low back pain with sciatica 03/21/2012   Chronic pain    Finger mass, right 10/17/2020   History of alcohol abuse 03/07/2016   History of colon polyps 11/18/2015   History of kidney stones    History of pulmonary embolism 03/19/2012   Hyperlipemia    Hypertension    Insomnia    Insomnia due to medical condition 12/23/2016   Polyuria secondary to BPH   Major depressive disorder, recurrent episode ([place])     Mitral regurgitation 04/24/2013   Mild by TEE   Morbid obesity ([place]) 03/07/2016   Nonrheumatic mitral valve insufficiency 04/25/2013   Mild by TEE  Overview:  Overview:  Mild by TEE Overview:  Overview:  Overview:  Mild by TEE Overview:  Overview:  Mild by TEE   Obesity    OSA (obstructive sleep apnea)    noncompliant with CPAP.  07/27/13- awaiting a CPAP- unable to tolerate mask   Osteoporosis    Persistent atrial fibrillation (HCC)    Recurrent unilateral inguinal hernia 07/17/2013   Restless leg syndrome    takes gabapentin   S/P right total knee arthroplasty 05/24/2018   Sciatica    Spinal stenosis, lumbar region with neurogenic claudication    SVT (supraventricular tachycardia) 03/07/2016   Thrombus of left atrial appendage 07/05/624   Uncomplicated asthma 9/48/5462   Overview:  Overview:  Overview:  Overview:  Qualifier: Diagnosis of  By: Sherren Mocha MD, Jory Ee Overview:  Overview:  Qualifier: Diagnosis of  By: Sherren Mocha MD, Jory Ee   Ventral hernia, unspecified, without mention of obstruction or gangrene    right abdominal wall   Past Surgical History:  Procedure Laterality Date   CARDIOVERSION N/A 03/21/2012   Procedure: CARDIOVERSION;  Surgeon: Birdie Riddle, MD;  Location: [place];  Service: Cardiovascular;  Laterality: N/A;   CARDIOVERSION N/A 04/24/2013   Procedure: CARDIOVERSION;  Surgeon: Dorothy Spark, MD;  Location: [place];  Service: Cardiovascular;  Laterality: N/A;   CARDIOVERSION N/A 06/27/2015   Procedure: CARDIOVERSION;  Surgeon: Larey Dresser, MD;  Location: [place];  Service: Cardiovascular;  Laterality: N/A;   CARDIOVERSION N/A 11/11/2021   Procedure: CARDIOVERSION;  Surgeon: Lelon Perla, MD;  Location: [place];  Service: Cardiovascular;  Laterality: N/A;   CATARACT EXTRACTION     COLONOSCOPY W/ POLYPECTOMY     ELECTROPHYSIOLOGIC STUDY N/A 07/30/2015   Procedure: Atrial Fibrillation Ablation;  Surgeon: Thompson Grayer, MD;  Location: [facility];  Service: Cardiovascular;  Laterality: N/A;   EXTRACORPOREAL SHOCK WAVE LITHOTRIPSY Right 10/29/2016   Procedure: RIGHT EXTRACORPOREAL SHOCK WAVE LITHOTRIPSY (ESWL);  Surgeon: Alexis Frock, MD;  Location: [facility];  Service: Urology;  Laterality: Right;   EYE SURGERY Right    cataract   FEMUR FRACTURE SURGERY     HERNIA REPAIR  07/06/2011   INGUINAL HERNIA REPAIR Right 07/28/2013   Procedure: RIGHT INGUINAL HERNIA REPAIR;  Surgeon: Imogene Burn. Georgette Dover, MD;  Location: [place];  Service: General;  Laterality: Right;   INGUINAL HERNIA REPAIR Right 09/22/2016   Procedure: LAPAROSCOPIC RIGHT INGUINAL HERNIA;  Surgeon: Kieth Brightly Arta Bruce, MD;  Location: [facility];  Service: General;  Laterality: Right;  With MESH   INSERTION OF MESH Right 07/28/2013   Procedure: INSERTION OF MESH;  Surgeon:  Imogene Burn. Georgette Dover, MD;  Location: [place];  Service: General;  Laterality: Right;   KNEE ARTHROSCOPY     left   REPLACEMENT TOTAL KNEE Left    ROTATOR CUFF REPAIR     right   ROTATOR CUFF REPAIR Left 03/08/2014   DR SUPPLE   SHOULDER ARTHROSCOPY WITH ROTATOR CUFF REPAIR AND SUBACROMIAL DECOMPRESSION Left 03/08/2014   Procedure: LEFT SHOULDER ARTHROSCOPY WITH ROTATOR CUFF REPAIR/SUBACROMIAL DECOMPRESSION/DISTAL CLAVICLE RESECTION;  Surgeon: Marin Shutter, MD;  Location: [place];  Service: Orthopedics;  Laterality: Left;   SVT ABLATION N/A 01/23/2019   Procedure: SVT ABLATION;  Surgeon: Evans Lance, MD;  Location: [place] [facility];  Service: Cardiovascular;  Laterality: N/A;   TEE WITHOUT CARDIOVERSION N/A 03/21/2012   Procedure: TRANSESOPHAGEAL ECHOCARDIOGRAM (TEE);  Surgeon: Birdie Riddle, MD;  Location: [place];  Service: Cardiovascular;  Laterality: N/A;   TEE WITHOUT CARDIOVERSION N/A 04/24/2013   Procedure: TRANSESOPHAGEAL ECHOCARDIOGRAM (TEE);  Surgeon: Dorothy Spark, MD;  Location: [place];  Service: Cardiovascular;  Laterality: N/A;   TEE WITHOUT CARDIOVERSION N/A 07/29/2015    Procedure: TRANSESOPHAGEAL ECHOCARDIOGRAM (TEE);  Surgeon: Fay Records, MD;  Location: [place];  Service: Cardiovascular;  Laterality: N/A;   TONSILLECTOMY     TOTAL KNEE ARTHROPLASTY Right 05/24/2018   Procedure: RIGHT TOTAL KNEE ARTHROPLASTY;  Surgeon: Paralee Cancel, MD;  [facility];  Service: Orthopedics;  Laterality: Right;  43 mins   Family History  Problem Relation Age of Onset   Cancer Father        bone   Heart failure Mother    Hypertension Mother    Asthma Mother    Cancer Paternal Grandmother        ovarian   CVA Other        Fam Hx of multiple myeloma   Diabetes Other        Fam Hx of DM   Social History   Socioeconomic History   Marital status: Married    Spouse name: Not on file   Number of children: 2   Years of education: college   Highest education level: Not on file  Occupational History   Not on file  Tobacco Use   Smoking status: Former    Packs/day: 0.50    Years: 5.00    Total pack years: 2.50    Types: Cigarettes    Quit date: 1976    Years since quitting: 47.9   Smokeless tobacco: Never  Vaping Use   Vaping Use: Never used  Substance and Sexual Activity   Alcohol use: No    Comment: Former EtOH abuse, stopped 06/2016   Drug use: No    Comment: negative hx for IV drug abuse   Sexual activity: Not on file  Other Topics Concern   Not on file  Social History Narrative   Lives with wife in [place]   Retired Dietitian   Wife lives in town (still married but lives separate)   Has biological daughter in [place] ([place])   Social Determinants of Health   Financial Resource Strain: [place]  (12/10/2021)   Overall Financial Resource Strain (CARDIA)    Difficulty of Paying Living Expenses: Not hard at all  Food Insecurity: No Food Insecurity (12/10/2021)   Hunger Vital Sign    Worried About Running Out of Food in the Last Year: Never true    Ran Out of Food in the Last Year: Never true  Transportation Needs: No  Transportation Needs (12/10/2021)   PRAPARE - Hydrologist (Medical): No    Lack of Transportation (Non-Medical): No  Physical Activity: Inactive (12/10/2021)   Exercise Vital Sign    Days of Exercise per Week: 0 days    Minutes of Exercise per Session: 0 min  Stress: No Stress Concern Present (12/10/2021)   [place]    Feeling of Stress : Not at all  Social Connections: Moderately Integrated (12/10/2021)   Social Connection and Isolation Panel [NHANES]    Frequency of Communication with Friends and Family: More than three times a week    Frequency of Social Gatherings with Friends and Family: Once a week    Attends Religious Services: 1 to 4 times per year    Active Member of [employer] or Organizations: Yes    Attends Archivist Meetings: 1 to 4 times per year    Marital Status: Separated    Tobacco Counseling Counseling given: Not Answered   Clinical Intake:  Pre-visit preparation completed: Yes  Pain : 0-10 Pain Type: Chronic pain Pain Location: Back Pain Descriptors / Indicators: Aching, Discomfort, Dull Pain Relieving Factors: nothing; discussed speaking with PCP  Pain Relieving Factors: nothing; discussed speaking with PCP  Nutritional Status: BMI > 30  Obese Nutritional Risks: None  How often do you need to have someone help you when you read instructions, pamphlets, or other written materials from your doctor or pharmacy?: 1 - Never  Diabetic?No  Interpreter Needed?: No  Information entered by :: Emelia Loron RN   Activities of Daily Living    12/10/2021   11:07 AM 06/04/2021    6:00 PM  In your present state of health, do you have any difficulty performing the following activities:  Hearing? 0 0  Vision? 0 0  Difficulty concentrating or making decisions? 0 0  Walking or climbing stairs? 0 0  Dressing or bathing? 0 0  Doing errands, shopping? 0 0  Preparing  Food and eating ? N   Using the Toilet? N   In the past six months, have you accidently leaked urine? N   Do you have problems with loss of bowel control? N   Managing your Medications? N   Managing your Finances? N   Housekeeping or managing your Housekeeping? N     Patient Care Team: [name], MD as PCP - General (Internal Medicine) Evans Lance, MD as PCP - Cardiology (Cardiology) Evans Lance, MD as PCP - Electrophysiology (Cardiology) Rexene Alberts, MD (Inactive) as Consulting Physician (Cardiothoracic Surgery) Dorothy Spark, MD as Consulting Physician (Cardiology)  Indicate any recent Medical Services you may have received from other than [facility] providers in the past year (date may be approximate).     Assessment:   This is a routine wellness examination for [place].  Hearing/Vision screen No results found.  Dietary issues and exercise activities discussed: Current Exercise Habits: The patient does not participate in regular exercise at present, Intensity: Mild   Goals Addressed   None   Depression Screen    12/10/2021   10:55 AM 06/20/2021    9:08 AM 01/21/2021    9:07 AM 12/24/2020    8:50 AM 07/22/2020    9:45 AM 03/29/2019    9:17 AM 07/29/2018    9:10 AM  PHQ 2/9 Scores  PHQ - 2 Score 0 0 0 0 0 0 0  PHQ-  9 Score  2 0 0 0      Fall Risk    12/10/2021   10:57 AM 06/20/2021    9:07 AM 01/21/2021    9:12 AM 07/22/2020    9:45 AM 03/29/2019    9:18 AM  Fall Risk   Falls in the past year? 0 0 0 0 0  Number falls in past yr: 0   0   Injury with Fall? 0   0   Follow up Falls evaluation completed        FALL RISK PREVENTION PERTAINING TO THE HOME:  Any stairs in or around the home? Yes  If so, are there any without handrails? Yes  Home free of loose throw rugs in walkways, pet beds, electrical cords, etc? Yes  Adequate lighting in your home to reduce risk of falls? Yes   ASSISTIVE DEVICES UTILIZED TO PREVENT FALLS:  Life alert? No  Use of a  cane, walker or w/c? Yes  Grab bars in the bathroom? No  Shower chair or bench in shower? No  Elevated toilet seat or a handicapped toilet? No   Cognitive Function:    12/10/2021   11:08 AM  MMSE - Mini Mental State Exam  Not completed: Refused        Immunizations Immunization History  Administered Date(s) Administered   Covid-19, Mrna,Vaccine(Spikevax)38yr and older 10/27/2021   Fluad Quad(high Dose 65+) 09/21/2018   Influenza Whole 10/17/2007   Influenza, High Dose Seasonal PF 10/09/2014   Influenza,inj,Quad PF,6+ Mos 10/21/2016, 03/09/2018   Influenza,inj,quad, With Preservative 12/12/2013, 12/05/2016   Influenza-Unspecified 10/14/2020, 09/05/2021   PFIZER Comirnaty(Gray Top)Covid-19 Tri-Sucrose Vaccine 04/24/2020   PFIZER(Purple Top)SARS-COV-2 Vaccination 02/19/2019, 03/14/2019, 10/10/2019   Pfizer Covid-19 Vaccine Bivalent Booster 148yr& up 09/12/2020   Pneumococcal Conjugate-13 03/11/2016, 10/21/2016   Pneumococcal Polysaccharide-23 10/09/2014, 09/12/2020   Pneumococcal-Unspecified 12/05/2016   Tdap 01/26/2010, 01/27/2012    TDAP status: Up to date  Flu Vaccine status: Up to date  Pneumococcal vaccine status: Up to date  Covid-19 vaccine status: Information provided on how to obtain vaccines.   Qualifies for Shingles Vaccine? Yes   Zostavax completed No   Shingrix Completed?: No.    Education has been provided regarding the importance of this vaccine. Patient has been advised to call insurance company to determine out of pocket expense if they have not yet received this vaccine. Advised may also receive vaccine at local pharmacy or Health Dept. Verbalized acceptance and understanding.  Screening Tests Health Maintenance  Topic Date Due   Zoster Vaccines- Shingrix (1 of 2) Never done   DTaP/Tdap/Td (3 - Td or Tdap) 01/26/2022   Medicare Annual Wellness (AWV)  12/11/2022   COLONOSCOPY (Pts 45-4925yrnsurance coverage will need to be confirmed)  12/02/2026    Pneumonia Vaccine 65+81ears old  Completed   INFLUENZA VACCINE  Completed   COVID-19 Vaccine  Completed   Hepatitis C Screening  Completed   HPV VACCINES  Aged Out    Health Maintenance  Health Maintenance Due  Topic Date Due   Zoster Vaccines- Shingrix (1 of 2) Never done    Colorectal cancer screening: Type of screening: Colonoscopy. Completed 12/01/16. Repeat every 10 years  Lung Cancer Screening: (Low Dose CT Chest recommended if Age 54-70-80ars, 30 pack-year currently smoking OR have quit w/in 15years.) does not qualify.   Additional Screening:  Hepatitis C Screening: does qualify; Completed 10/21/16  Vision Screening: Recommended annual ophthalmology exams for early detection of glaucoma and other disorders  of the eye. Is the patient up to date with their annual eye exam?  Yes  Who is the provider or what is the name of the office in which the patient attends annual eye exams? [facility] If pt is not established with a provider, would they like to be referred to a provider to establish care?  N/A .   Dental Screening: Recommended annual dental exams for proper oral hygiene  Community Resource Referral / Chronic Care Management: CRR required this visit?  No   CCM required this visit?  No      Plan:     I have personally reviewed and noted the following in the patient's chart:   Medical and social history Use of alcohol, tobacco or illicit drugs  Current medications and supplements including opioid prescriptions. Patient is not currently taking opioid prescriptions. Functional ability and status Nutritional status Physical activity Advanced directives List of other physicians Hospitalizations, surgeries, and ER visits in previous 12 months Vitals Screenings to include cognitive, depression, and falls Referrals and appointments  In addition, I have reviewed and discussed with patient certain preventive protocols, quality metrics, and best practice  recommendations. A written personalized care plan for preventive services as well as general preventive health recommendations were provided to patient.     Michiel Cowboy, RN   12/10/2021   Nurse Notes:  Mr. Gallery , Thank you for taking time to come for your Medicare Wellness Visit. I appreciate your ongoing commitment to your health goals. Please review the following plan we discussed and let me know if I can assist you in the future.   These are the goals we discussed:  Goals   None     This is a list of the screening recommended for you and due dates:  Health Maintenance  Topic Date Due   Zoster (Shingles) Vaccine (1 of 2) Never done   DTaP/Tdap/Td vaccine (3 - Td or Tdap) 01/26/2022   Medicare Annual Wellness Visit  12/11/2022   Colon Cancer Screening  12/02/2026   Pneumonia Vaccine  Completed   Flu Shot  Completed   COVID-19 Vaccine  Completed   Hepatitis C Screening: USPSTF Recommendation to screen - Ages 18-79 yo.  Completed   HPV Vaccine  Aged Out

## 2021-12-09 NOTE — Patient Instructions (Signed)
Health Maintenance, Male Adopting a healthy lifestyle and getting preventive care are important in promoting health and wellness. Ask your health care provider about: The right schedule for you to have regular tests and exams. Things you can do on your own to prevent diseases and keep yourself healthy. What should I know about diet, weight, and exercise? Eat a healthy diet  Eat a diet that includes plenty of vegetables, fruits, low-fat dairy products, and lean protein. Do not eat a lot of foods that are high in solid fats, added sugars, or sodium. Maintain a healthy weight Body mass index (BMI) is a measurement that can be used to identify possible weight problems. It estimates body fat based on height and weight. Your health care provider can help determine your BMI and help you achieve or maintain a healthy weight. Get regular exercise Get regular exercise. This is one of the most important things you can do for your health. Most adults should: Exercise for at least 150 minutes each week. The exercise should increase your heart rate and make you sweat (moderate-intensity exercise). Do strengthening exercises at least twice a week. This is in addition to the moderate-intensity exercise. Spend less time sitting. Even light physical activity can be beneficial. Watch cholesterol and blood lipids Have your blood tested for lipids and cholesterol at 73 years of age, then have this test every 5 years. You may need to have your cholesterol levels checked more often if: Your lipid or cholesterol levels are high. You are older than 73 years of age. You are at high risk for heart disease. What should I know about cancer screening? Many types of cancers can be detected early and may often be prevented. Depending on your health history and family history, you may need to have cancer screening at various ages. This may include screening for: Colorectal cancer. Prostate cancer. Skin cancer. Lung  cancer. What should I know about heart disease, diabetes, and high blood pressure? Blood pressure and heart disease High blood pressure causes heart disease and increases the risk of stroke. This is more likely to develop in people who have high blood pressure readings or are overweight. Talk with your health care provider about your target blood pressure readings. Have your blood pressure checked: Every 3-5 years if you are 18-39 years of age. Every year if you are 40 years old or older. If you are between the ages of 65 and 75 and are a current or former smoker, ask your health care provider if you should have a one-time screening for abdominal aortic aneurysm (AAA). Diabetes Have regular diabetes screenings. This checks your fasting blood sugar level. Have the screening done: Once every three years after age 45 if you are at a normal weight and have a low risk for diabetes. More often and at a younger age if you are overweight or have a high risk for diabetes. What should I know about preventing infection? Hepatitis B If you have a higher risk for hepatitis B, you should be screened for this virus. Talk with your health care provider to find out if you are at risk for hepatitis B infection. Hepatitis C Blood testing is recommended for: Everyone born from 1945 through 1965. Anyone with known risk factors for hepatitis C. Sexually transmitted infections (STIs) You should be screened each year for STIs, including gonorrhea and chlamydia, if: You are sexually active and are younger than 73 years of age. You are older than 73 years of age and your   health care provider tells you that you are at risk for this type of infection. Your sexual activity has changed since you were last screened, and you are at increased risk for chlamydia or gonorrhea. Ask your health care provider if you are at risk. Ask your health care provider about whether you are at high risk for HIV. Your health care provider  may recommend a prescription medicine to help prevent HIV infection. If you choose to take medicine to prevent HIV, you should first get tested for HIV. You should then be tested every 3 months for as long as you are taking the medicine. Follow these instructions at home: Alcohol use Do not drink alcohol if your health care provider tells you not to drink. If you drink alcohol: Limit how much you have to 0-2 drinks a day. Know how much alcohol is in your drink. In the [place] one drink equals one 12 oz bottle of beer (355 mL), one 5 oz glass of Tjuana Vickrey (148 mL), or one 1 oz glass of hard liquor (44 mL). Lifestyle Do not use any products that contain nicotine or tobacco. These products include cigarettes, chewing tobacco, and vaping devices, such as e-cigarettes. If you need help quitting, ask your health care provider. Do not use street drugs. Do not share needles. Ask your health care provider for help if you need support or information about quitting drugs. General instructions Schedule regular health, dental, and eye exams. Stay current with your vaccines. Tell your health care provider if: You often feel depressed. You have ever been abused or do not feel safe at home. Summary Adopting a healthy lifestyle and getting preventive care are important in promoting health and wellness. Follow your health care provider's instructions about healthy diet, exercising, and getting tested or screened for diseases. Follow your health care provider's instructions on monitoring your cholesterol and blood pressure. This information is not intended to replace advice given to you by your health care provider. Make sure you discuss any questions you have with your health care provider. Document Revised: 05/13/2020 Document Reviewed: 05/13/2020 Elsevier Patient Education  2023 [employer]vier Inc.  

## 2021-12-10 ENCOUNTER — Ambulatory Visit (INDEPENDENT_AMBULATORY_CARE_PROVIDER_SITE_OTHER): Payer: Medicare Other | Admitting: *Deleted

## 2021-12-10 DIAGNOSIS — Z0000 Encounter for general adult medical examination without abnormal findings: Secondary | ICD-10-CM

## 2022-01-01 ENCOUNTER — Ambulatory Visit: Payer: Medicare Other | Admitting: Cardiovascular Disease

## 2022-01-12 ENCOUNTER — Encounter: Payer: Self-pay | Admitting: Cardiovascular Disease

## 2022-01-12 ENCOUNTER — Ambulatory Visit: Payer: Medicare Other | Attending: Cardiovascular Disease | Admitting: Cardiovascular Disease

## 2022-01-12 VITALS — BP 124/86 | HR 73 | Ht 76.0 in | Wt 277.0 lb

## 2022-01-12 DIAGNOSIS — I480 Paroxysmal atrial fibrillation: Secondary | ICD-10-CM | POA: Insufficient documentation

## 2022-01-12 NOTE — Addendum Note (Signed)
Addended by: Molli Barrows on: 01/12/2022 12:13 PM   Modules accepted: Orders

## 2022-01-12 NOTE — Patient Instructions (Addendum)
Medication Instructions:  Your physician recommends that you continue on your current medications as directed. Please refer to the Current Medication list given to you today.  *If you need a refill on your cardiac medications before your next appointment, please call your pharmacy*  Lab Work: On February 12, 2022: BMET and CBC prior to your CT scan and ablation If you have labs (blood work) drawn today and your tests are completely normal, you will receive your results only by: [place] (if you have MyChart) OR A paper copy in the mail If you have any lab test that is abnormal or we need to change your treatment, we will call you to review the results.  Testing/Procedures: Your physician has requested that you have cardiac CT. Cardiac computed tomography (CT) is a painless test that uses an x-ray machine to take clear, detailed pictures of your heart. For further information please visit [email or web address]. Please follow instruction sheet as given.  Your physician has recommended that you have an ablation on Friday February 27, 2022. Catheter ablation is a medical procedure used to treat some cardiac arrhythmias (irregular heartbeats). During catheter ablation, a long, thin, flexible tube is put into a blood vessel in your groin (upper thigh), or neck. This tube is called an ablation catheter. It is then guided to your heart through the blood vessel. Radio frequency waves destroy small areas of heart tissue where abnormal heartbeats may cause an arrhythmia to start. Please see the instruction sheet given to you today.   Follow-Up: We will contact you to arrange your follow-up appointment.   Important Information About [employer]

## 2022-01-12 NOTE — Progress Notes (Signed)
Electrophysiology Office Note:    Date:  01/12/2022   ID:  Brandon [name]., DOB 09-03-1950, MRN [id number]  PCP:  [name], MD   [facility] Cardiologist:  [name], MD Electrophysiologist:  Melida Quitter, MD     Referring MD: [name], MD   History of Present Illness:    Brandon [name]. is a 72 y.o. male with a hx listed below, significant for persistent atrial fibrillation, AVNRT s/p ablation referred for arrhythmia management.  He underwent an ablation by Dr. Rayann Heman for AF in the past. He has also had an ablation for AVNRT by Dr.  Lovena Le.  He lives in [place].  He notes that he is very easily fatigued and is not nearly as capable as he was prior to recurrence of atrial fibrillation.  He denies chest pain, syncope, presyncope.  Past Medical History:  Diagnosis Date   Anxiety    Arthritis    Benign neoplasm of colon    Benign prostatic hyperplasia with urinary obstruction    Bilateral inguinal hernia 05/29/2011   Bilateral pulmonary embolism ([place]) 3/14   admitted to [facility],  treated with Xarelto   Breast lump 03/29/2019   Calculus of kidney 10/21/2016   Cataract    Chronic bilateral low back pain with sciatica 03/21/2012   Chronic pain    Finger mass, right 10/17/2020   History of alcohol abuse 03/07/2016   History of colon polyps 11/18/2015   History of kidney stones    History of pulmonary embolism 03/19/2012   Hyperlipemia    Hypertension    Insomnia    Insomnia due to medical condition 12/23/2016   Polyuria secondary to BPH   Major depressive disorder, recurrent episode ([place])    Mitral regurgitation 04/24/2013   Mild by TEE   Morbid obesity ([place]) 03/07/2016   Nonrheumatic mitral valve insufficiency 04/25/2013   Mild by TEE  Overview:  Overview:  Mild by TEE Overview:  Overview:  Overview:  Mild by TEE Overview:  Overview:  Mild by TEE   Obesity    OSA (obstructive sleep apnea)    noncompliant with CPAP.  07/27/13-  awaiting a CPAP- unable to tolerate mask   Osteoporosis    Persistent atrial fibrillation (HCC)    Recurrent unilateral inguinal hernia 07/17/2013   Restless leg syndrome    takes gabapentin   S/P right total knee arthroplasty 05/24/2018   Sciatica    Spinal stenosis, lumbar region with neurogenic claudication    SVT (supraventricular tachycardia) 03/07/2016   Thrombus of left atrial appendage 04/13/7024   Uncomplicated asthma 3/78/5885   Overview:  Overview:  Overview:  Overview:  Qualifier: Diagnosis of  By: Sherren Mocha MD, Jory Ee Overview:  Overview:  Qualifier: Diagnosis of  By: Sherren Mocha MD, Jory Ee   Ventral hernia, unspecified, without mention of obstruction or gangrene    right abdominal wall    Past Surgical History:  Procedure Laterality Date   CARDIOVERSION N/A 03/21/2012   Procedure: CARDIOVERSION;  Surgeon: Birdie Riddle, MD;  Location: [facility];  Service: Cardiovascular;  Laterality: N/A;   CARDIOVERSION N/A 04/24/2013   Procedure: CARDIOVERSION;  Surgeon: Dorothy Spark, MD;  Location: [facility];  Service: Cardiovascular;  Laterality: N/A;   CARDIOVERSION N/A 06/27/2015   Procedure: CARDIOVERSION;  Surgeon: Larey Dresser, MD;  Location: [facility] ENDOSCOPY;  Service: Cardiovascular;  Laterality: N/A;   CARDIOVERSION N/A 11/11/2021   Procedure: CARDIOVERSION;  Surgeon: Lelon Perla,  MD;  Location: [facility];  Service: Cardiovascular;  Laterality: N/A;   CATARACT EXTRACTION     COLONOSCOPY W/ POLYPECTOMY     ELECTROPHYSIOLOGIC STUDY N/A 07/30/2015   Procedure: Atrial Fibrillation Ablation;  Surgeon: Thompson Grayer, MD;  Location: [place] CV LAB;  Service: Cardiovascular;  Laterality: N/A;   EXTRACORPOREAL SHOCK WAVE LITHOTRIPSY Right 10/29/2016   Procedure: RIGHT EXTRACORPOREAL SHOCK WAVE LITHOTRIPSY (ESWL);  Surgeon: Alexis Frock, MD;  Location: [facility];  Service: Urology;  Laterality: Right;   EYE SURGERY Right    cataract   FEMUR FRACTURE SURGERY     HERNIA  REPAIR  07/06/2011   INGUINAL HERNIA REPAIR Right 07/28/2013   Procedure: RIGHT INGUINAL HERNIA REPAIR;  Surgeon: Imogene Burn. Georgette Dover, MD;  Location: [facility];  Service: General;  Laterality: Right;   INGUINAL HERNIA REPAIR Right 09/22/2016   Procedure: LAPAROSCOPIC RIGHT INGUINAL HERNIA;  Surgeon: Kieth Brightly Arta Bruce, MD;  Location: [facility];  Service: General;  Laterality: Right;  With MESH   INSERTION OF MESH Right 07/28/2013   Procedure: INSERTION OF MESH;  Surgeon: Imogene Burn. Georgette Dover, MD;  Location: [facility];  Service: General;  Laterality: Right;   KNEE ARTHROSCOPY     left   REPLACEMENT TOTAL KNEE Left    ROTATOR CUFF REPAIR     right   ROTATOR CUFF REPAIR Left 03/08/2014   DR SUPPLE   SHOULDER ARTHROSCOPY WITH ROTATOR CUFF REPAIR AND SUBACROMIAL DECOMPRESSION Left 03/08/2014   Procedure: LEFT SHOULDER ARTHROSCOPY WITH ROTATOR CUFF REPAIR/SUBACROMIAL DECOMPRESSION/DISTAL CLAVICLE RESECTION;  Surgeon: Marin Shutter, MD;  Location: [place];  Service: Orthopedics;  Laterality: Left;   SVT ABLATION N/A 01/23/2019   Procedure: SVT ABLATION;  Surgeon: Evans Lance, MD;  Location: [place] CV LAB;  Service: Cardiovascular;  Laterality: N/A;   TEE WITHOUT CARDIOVERSION N/A 03/21/2012   Procedure: TRANSESOPHAGEAL ECHOCARDIOGRAM (TEE);  Surgeon: Birdie Riddle, MD;  Location: [facility];  Service: Cardiovascular;  Laterality: N/A;   TEE WITHOUT CARDIOVERSION N/A 04/24/2013   Procedure: TRANSESOPHAGEAL ECHOCARDIOGRAM (TEE);  Surgeon: Dorothy Spark, MD;  Location: [place] [facility];  Service: Cardiovascular;  Laterality: N/A;   TEE WITHOUT CARDIOVERSION N/A 07/29/2015   Procedure: TRANSESOPHAGEAL ECHOCARDIOGRAM (TEE);  Surgeon: Fay Records, MD;  Location: [facility];  Service: Cardiovascular;  Laterality: N/A;   TONSILLECTOMY     TOTAL KNEE ARTHROPLASTY Right 05/24/2018   Procedure: RIGHT TOTAL KNEE ARTHROPLASTY;  Surgeon: Paralee Cancel, MD;  Location: [facility];  Service: Orthopedics;  Laterality: Right;  70  mins    Current Medications: Current Meds  Medication Sig   amLODipine (NORVASC) 5 MG tablet Take 1 tablet (5 mg total) by mouth daily.   apixaban (ELIQUIS) 5 MG TABS tablet TAKE 1 TABLET BY MOUTH TWICE A DAY   atorvastatin (LIPITOR) 40 MG tablet Take 1 tablet (40 mg total) by mouth daily.   carvedilol (COREG) 25 MG tablet Take 1 tablet (25 mg total) by mouth 2 (two) times daily with a meal.   diphenhydramine-acetaminophen (TYLENOL PM) 25-500 MG TABS tablet Take 2 tablets by mouth at bedtime as needed (sleep).   dutasteride (AVODART) 0.5 MG capsule Take 0.5 mg by mouth daily.   escitalopram (LEXAPRO) 20 MG tablet Take 1 tablet (20 mg total) by mouth daily.   fluticasone (FLONASE) 50 MCG/ACT nasal spray Place 2 sprays into both nostrils daily. (Patient taking differently: Place 2 sprays into both nostrils daily as needed for allergies.)   gabapentin (NEURONTIN) 800 MG tablet Take 1 tablet (  800 mg total) by mouth 3 (three) times daily.   irbesartan (AVAPRO) 300 MG tablet Take 1 tablet (300 mg total) by mouth daily.   memantine (NAMENDA) 5 MG tablet TAKE 1 TABLET BY MOUTH TWICE A DAY   potassium chloride SA (KLOR-CON M) 20 MEQ tablet Take 1 tablet (20 mEq total) by mouth 2 (two) times daily for 10 days.   spironolactone (ALDACTONE) 25 MG tablet TAKE 1 TABLET (25 MG TOTAL) BY MOUTH DAILY.   tamsulosin (FLOMAX) 0.4 MG CAPS capsule Take 0.4 mg by mouth 2 (two) times daily.     Allergies:   Ace inhibitors and Albuterol   Social History   Socioeconomic History   Marital status: Married    Spouse name: Not on file   Number of children: 2   Years of education: college   Highest education level: Not on file  Occupational History   Not on file  Tobacco Use   Smoking status: Former    Packs/day: 0.50    Years: 5.00    Total pack years: 2.50    Types: Cigarettes    Quit date: 38    Years since quitting: 48.0   Smokeless tobacco: Never  Vaping Use   Vaping Use: Never used  Substance  and Sexual Activity   Alcohol use: No    Comment: Former EtOH abuse, stopped 06/2016   Drug use: No    Comment: negative hx for IV drug abuse   Sexual activity: Not on file  Other Topics Concern   Not on file  Social History Narrative   Lives with wife in [place]   Retired Dietitian   Wife lives in town (still married but lives separate)   Has biological daughter in [place] ([place])   Social Determinants of Health   Financial Resource Strain: [place]  (12/10/2021)   Overall Financial Resource Strain (CARDIA)    Difficulty of Paying Living Expenses: Not hard at all  Food Insecurity: No [place] (12/10/2021)   Hunger Vital Sign    Worried About Running Out of Food in the Last Year: Never true    Cricket in the Last Year: Never true  Transportation Needs: No Transportation Needs (12/10/2021)   PRAPARE - Hydrologist (Medical): No    Lack of Transportation (Non-Medical): No  Physical Activity: Inactive (12/10/2021)   Exercise Vital Sign    Days of Exercise per Week: 0 days    Minutes of Exercise per Session: 0 min  Stress: No Stress Concern Present (12/10/2021)   [place]    Feeling of Stress : Not at all  Social Connections: Moderately Integrated (12/10/2021)   Social Connection and Isolation Panel [NHANES]    Frequency of Communication with Friends and Family: More than three times a week    Frequency of Social Gatherings with Friends and Family: Once a week    Attends Religious Services: 1 to 4 times per year    Active Member of [employer] or Organizations: Yes    Attends Archivist Meetings: 1 to 4 times per year    Marital Status: Separated     Family History: The patient's family history includes Asthma in his mother; CVA in an other family member; Cancer in his father and paternal grandmother; Diabetes in an other family member; Heart failure  in his mother; Hypertension in his mother.  ROS:  Please see the history of present illness.    All other systems reviewed and are negative.  EKGs/Labs/Other Studies Reviewed Today:      EKG:  Last EKG results: today - AF  TTE: severe LAE   Recent Labs: 02/03/2021: TSH 2.01 09/26/2021: ALT 9 10/19/2021: Magnesium 1.8; Platelets 152 11/11/2021: BUN 18; Creatinine, Ser 0.80; Hemoglobin 16.0; Potassium 3.9; Sodium 140     Physical Exam:    VS:  BP 124/86   Pulse 73   Ht '6\' 4"'$  (1.93 m)   Wt 277 lb (125.6 kg)   SpO2 94%   BMI 33.72 kg/m     Wt Readings from Last 3 Encounters:  01/12/22 277 lb (125.6 kg)  12/09/21 271 lb 9.6 oz (123.2 kg)  11/11/21 287 lb (130.2 kg)     GEN: Well nourished, well developed in no acute distress CARDIAC: Irregular rhythm, no murmurs, rubs, gallops RESPIRATORY:  Normal work of breathing MUSCULOSKELETAL: no edema    ASSESSMENT & PLAN:    Persistent AF: Status post ablation by Dr. Rayann Heman.  Very symptomatic.  I think it reasonable to repeat EP study to see if his pulmonary veins were reconnected.  I will also plan to investigate the posterior wall, and will likely perform a posterior wall ablation. We discussed the indication, rationale, logistics, anticipated benefits, and potential risks of the ablation procedure including but not limited to -- bleed at the groin access site, chest pain, damage to nearby organs such as the diaphragm, lungs, or esophagus, need for a drainage tube, or prolonged hospitalization. I explained that the risk for stroke, heart attack, need for open chest surgery, or even death is very low but not zero. he  expressed understanding and wishes to proceed.  AVNRT: s/p ablation OSA: will need to be compliant with CPAP to reduce risk of AF recurrence        Medication Adjustments/Labs and Tests Ordered: Current medicines are reviewed at length with the patient today.  Concerns regarding medicines are outlined above.   Orders Placed This Encounter  Procedures   EKG 12-Lead   No orders of the defined types were placed in this encounter.    Signed, Melida Quitter, MD  01/12/2022 11:23 AM    [place]

## 2022-01-24 ENCOUNTER — Other Ambulatory Visit: Payer: Self-pay | Admitting: Student

## 2022-02-03 ENCOUNTER — Encounter: Payer: Self-pay | Admitting: Internal Medicine

## 2022-02-03 ENCOUNTER — Encounter: Payer: Self-pay | Admitting: Orthopaedic Surgery

## 2022-02-03 ENCOUNTER — Other Ambulatory Visit: Payer: Self-pay | Admitting: Physician Assistant

## 2022-02-03 MED ORDER — PREDNISONE 10 MG (21) PO TBPK: ORAL_TABLET | ORAL | 0 refills | Status: DC

## 2022-02-03 NOTE — Telephone Encounter (Signed)
Sent in a steroid pack

## 2022-02-12 ENCOUNTER — Ambulatory Visit: Payer: Medicare Other | Attending: Cardiovascular Disease

## 2022-02-12 ENCOUNTER — Ambulatory Visit (INDEPENDENT_AMBULATORY_CARE_PROVIDER_SITE_OTHER): Payer: Medicare Other | Admitting: Internal Medicine

## 2022-02-12 ENCOUNTER — Encounter (HOSPITAL_COMMUNITY): Payer: Self-pay | Admitting: *Deleted

## 2022-02-12 ENCOUNTER — Encounter: Payer: Self-pay | Admitting: Internal Medicine

## 2022-02-12 VITALS — BP 118/78 | HR 59 | Temp 98.1°F | Ht 76.0 in

## 2022-02-12 DIAGNOSIS — I480 Paroxysmal atrial fibrillation: Secondary | ICD-10-CM | POA: Diagnosis not present

## 2022-02-12 DIAGNOSIS — R4189 Other symptoms and signs involving cognitive functions and awareness: Secondary | ICD-10-CM | POA: Diagnosis not present

## 2022-02-12 DIAGNOSIS — J301 Allergic rhinitis due to pollen: Secondary | ICD-10-CM

## 2022-02-12 DIAGNOSIS — M48062 Spinal stenosis, lumbar region with neurogenic claudication: Secondary | ICD-10-CM

## 2022-02-12 LAB — CBC WITH DIFFERENTIAL/PLATELET
Basophils Absolute: 0 10*3/uL (ref 0.0–0.2)
Basos: 0 %
EOS (ABSOLUTE): 0.4 10*3/uL (ref 0.0–0.4)
Eos: 4 %
Hematocrit: 45.2 % (ref 37.5–51.0)
Hemoglobin: 15.5 g/dL (ref 13.0–17.7)
Lymphocytes Absolute: 1.9 10*3/uL (ref 0.7–3.1)
Lymphs: 17 %
MCH: 30.6 pg (ref 26.6–33.0)
MCHC: 34.3 g/dL (ref 31.5–35.7)
MCV: 89 fL (ref 79–97)
Monocytes Absolute: 1.2 10*3/uL — ABNORMAL HIGH (ref 0.1–0.9)
Monocytes: 11 %
Neutrophils Absolute: 7.4 10*3/uL — ABNORMAL HIGH (ref 1.4–7.0)
Neutrophils: 68 %
Platelets: 172 10*3/uL (ref 150–450)
RBC: 5.06 x10E6/uL (ref 4.14–5.80)
RDW: 15.9 % — ABNORMAL HIGH (ref 11.6–15.4)
WBC: 10.9 10*3/uL — ABNORMAL HIGH (ref 3.4–10.8)

## 2022-02-12 LAB — BASIC METABOLIC PANEL
BUN/Creatinine Ratio: 24 (ref 10–24)
BUN: 25 mg/dL (ref 8–27)
CO2: 31 mmol/L — ABNORMAL HIGH (ref 20–29)
Calcium: 9.4 mg/dL (ref 8.6–10.2)
Chloride: 99 mmol/L (ref 96–106)
Creatinine, Ser: 1.06 mg/dL (ref 0.76–1.27)
Glucose: 71 mg/dL (ref 70–99)
Potassium: 4 mmol/L (ref 3.5–5.2)
Sodium: 137 mmol/L (ref 134–144)
eGFR: 74 mL/min/{1.73_m2} (ref >59)

## 2022-02-12 MED ORDER — HYDROCODONE-ACETAMINOPHEN 5-325 MG PO TABS: 1.0000 | ORAL_TABLET | Freq: Four times a day (QID) | ORAL | 0 refills | Status: DC | PRN

## 2022-02-12 MED ORDER — MEMANTINE HCL 10 MG PO TABS: 10.0000 mg | ORAL_TABLET | Freq: Two times a day (BID) | ORAL | 1 refills | Status: DC

## 2022-02-12 MED ORDER — FLUTICASONE PROPIONATE 50 MCG/ACT NA SUSP: 2.0000 | Freq: Every day | NASAL | 1 refills | Status: DC

## 2022-02-12 NOTE — Patient Instructions (Signed)
Spondylolysis  Spondylolysis is a small break or crack (stress fracture) in a bone in the spine (vertebra) in the lower back (lumbar spine). The stress fracture occurs on the bony mass between and behind the vertebra. Spondylolysis may be caused by an injury (trauma) or by overuse. Since the lower back is almost always under pressure from daily living, this stress fracture usually does not heal normally. Spondylolysis may eventually cause one vertebra to slip forward and out of place (spondylolisthesis). What are the causes? This condition may be caused by: Trauma, such as a fall. Excessive wear and tear. This often results from doing sports or physical activities that involve repetitive overstretching and rotation of the spine. What increases the risk? You are more likely to develop this condition if you have: A family history of this condition. An inward curvature of your spine (lordosis). A condition that affects your spine, such as spina bifida. You are more likely to develop this condition if you participate in: Gymnastics or dance. Football. Wrestling or martial arts. Weight lifting. Tennis. Swimming. What are the signs or symptoms? Symptoms of this condition may include: Long-lasting (chronic) pain in the lower back. Stiffness in the back or legs. Tightness in the backs of the thighs (hamstring muscles). In some cases, there may be no symptoms. How is this diagnosed? This condition may be diagnosed based on: Your symptoms. Your medical history. A physical exam. Imaging tests, such as: X-rays. A CT scan. An MRI. How is this treated? This condition may be treated with: Rest. You may be asked to avoid or change activities that put strain on your back until your symptoms improve. Pain medicines, or medicines such as NSAIDs to help with swelling and discomfort. Injections of steroid medicine in your back to relieve pain, swelling, and numbness. A brace to stabilize and  support your back. Physical therapy. You may work with an occupational therapist or physical therapist who can teach you how to reduce pressure on your back while you do activities. Surgery. This may be needed if you have: A severe injury. Pain that lasts for more than 6 months. Numbness in your pelvic region. Changes in your ability to control your bladder or bowels. Follow these instructions at home: Medicines Take over-the-counter and prescription medicines only as told by your health care provider. Ask your health care provider if the medicine prescribed to you: Requires you to avoid driving or using heavy machinery. Can cause constipation. You may need to take these actions to prevent or treat constipation: Drink enough fluid to keep your urine pale yellow. Take over-the-counter or prescription medicines. Eat foods that are high in fiber, such as beans, whole grains, and fresh fruits and vegetables. Limit foods that are high in fat and processed sugars, such as fried or sweet foods. If you have a removable brace: Wear the brace as told by your health care provider. Remove it only as told by your health care provider. Keep the brace clean. If the brace is not waterproof: Do not let it get wet. Cover it with a watertight covering when you take a bath or a shower. Activity Rest and return to your normal activities as told by your health care provider. Ask your health care provider what activities are safe for you. Ask your health care provider when it is safe to drive if you have a back brace. Work with a physical therapist to make a safe exercise program as told by your health care provider. Do exercises as told  by your physical therapist. This may include exercises to strengthen your back and abdominal muscles (core exercises). Managing pain, stiffness, and swelling     If directed, put ice on the affected area. If you have a removable brace, remove it as told by your health care  provider. Put ice in a plastic bag. Place a towel between your skin and the bag. Leave the ice on for 20 minutes, 2-3 times a day. If your skin turns bright red, remove the ice right away to prevent skin damage. The risk of skin damage is higher if you cannot feel pain, heat, or cold. If directed, apply heat to the affected area as often as told by your health care provider. Use the heat source that your health care provider recommends, such as a moist heat pack or a heating pad. If you have a removable brace, remove it as told by your health care provider. Place a towel between your skin and the heat source. Leave the heat on for 20-30 minutes. If your skin turns bright red, remove the heat right away to prevent burns. The risk of burns is higher if you cannot feel pain, heat, or cold. General instructions Do not use any products that contain nicotine or tobacco. These products include cigarettes, chewing tobacco, and vaping devices, such as e-cigarettes. These can delay bone healing. If you need help quitting, ask your health care provider. Maintain a healthy weight. Extra weight puts stress on your back. Keep all follow-up visits. Your health care provider will monitor if your fracture is healing and help you manage your pain. Contact a health care provider if: You have pain that gets worse or does not get better. Get help right away if: You have severe back pain. Your ability to control your bowel or bladder changes. You develop weakness or numbness in your legs. You cannot stand or walk. Summary Spondylolysis is a small break or crack (stress fracture) in a bone in the spine (vertebra) in the lower back (lumbar spine). This condition may be treated by resting, medicines, physical therapy, wearing a brace, or surgery. Rest and return to your normal activities as told by your health care provider. Contact a health care provider if you have pain that gets worse or does not get  better. This information is not intended to replace advice given to you by your health care provider. Make sure you discuss any questions you have with your health care provider. Document Revised: 03/06/2021 Document Reviewed: 03/06/2021 Elsevier Patient Education  [place].

## 2022-02-12 NOTE — Progress Notes (Signed)
Subjective:  Patient ID: Brandon [name]., male    DOB: 04/30/1950  Age: 72 y.o. MRN: [id number]  CC: Back Pain   HPI Hilberto B [place]elstadt Jr. presents for f/up -  He complains of a one week hx of LBP that radiates into BLE. No history or trauma. He denies paresthesias. He has gotten minimal symptom relief with steroids.  Outpatient Medications Prior to Visit  Medication Sig Dispense Refill   amLODipine (NORVASC) 5 MG tablet Take 1 tablet (5 mg total) by mouth daily. 90 tablet 3   apixaban (ELIQUIS) 5 MG TABS tablet TAKE 1 TABLET BY MOUTH TWICE A DAY 60 tablet 5   atorvastatin (LIPITOR) 40 MG tablet Take 1 tablet (40 mg total) by mouth daily. 90 tablet 3   carvedilol (COREG) 25 MG tablet Take 1 tablet (25 mg total) by mouth 2 (two) times daily with a meal. 180 tablet 3   dutasteride (AVODART) 0.5 MG capsule Take 0.5 mg by mouth daily.     escitalopram (LEXAPRO) 20 MG tablet Take 1 tablet (20 mg total) by mouth daily. 90 tablet 1   gabapentin (NEURONTIN) 800 MG tablet Take 1 tablet (800 mg total) by mouth 3 (three) times daily. 270 tablet 1   irbesartan (AVAPRO) 300 MG tablet Take 1 tablet (300 mg total) by mouth daily. 90 tablet 1   spironolactone (ALDACTONE) 25 MG tablet TAKE 1 TABLET (25 MG TOTAL) BY MOUTH DAILY. 90 tablet 0   tamsulosin (FLOMAX) 0.4 MG CAPS capsule TAKE 1 CAPSULE BY MOUTH 2 TIMES DAILY. 180 capsule 1   triamcinolone cream (KENALOG) 0.1 % Apply 1 Application topically 2 (two) times daily as needed (rash).     diphenhydramine-acetaminophen (TYLENOL PM) 25-500 MG TABS tablet Take 2 tablets by mouth at bedtime as needed (sleep).     memantine (NAMENDA) 5 MG tablet TAKE 1 TABLET BY MOUTH TWICE A DAY 180 tablet 1   predniSONE (STERAPRED UNI-PAK 21 TAB) 10 MG (21) TBPK tablet Take as directed 21 tablet 0   potassium chloride SA (KLOR-CON M) 20 MEQ tablet Take 1 tablet (20 mEq total) by mouth 2 (two) times daily for 10 days. 20 tablet 0   sucralfate (CARAFATE) 1 g tablet Take 1 tablet  (1 g total) by mouth with breakfast, with lunch, and with evening meal for 7 days. (Patient not taking: Reported on 11/10/2021) 21 tablet 0   fluticasone (FLONASE) 50 MCG/ACT nasal spray Place 2 sprays into both nostrils daily. (Patient not taking: Reported on 02/12/2022) 48 g 1   No facility-administered medications prior to visit.    ROS Review of Systems  Constitutional: Negative.  Negative for fatigue and fever.  HENT:  Positive for postnasal drip and rhinorrhea. Negative for sinus pressure, sore throat and tinnitus.   Eyes: Negative.   Respiratory:  Negative for cough, chest tightness, shortness of breath and wheezing.   Cardiovascular:  Negative for chest pain.  Gastrointestinal: Negative.  Negative for abdominal pain, constipation and diarrhea.  Endocrine: Negative.   Genitourinary: Negative.  Negative for difficulty urinating.  Musculoskeletal:  Positive for arthralgias, back pain and gait problem. Negative for joint swelling and myalgias.  Skin: Negative.   Neurological:  Negative for dizziness, weakness and numbness.  Hematological:  Negative for adenopathy. Does not bruise/bleed easily.  Psychiatric/Behavioral:  Positive for confusion and decreased concentration. Negative for hallucinations, sleep disturbance and suicidal ideas. The patient is not nervous/anxious.     Objective:  BP 118/78 (BP Location: Left Arm,  Patient Position: Sitting, Cuff Size: Normal)   Pulse (!) 59   Temp 98.1 F (36.7 C) (Oral)   Ht 6' 4"$  (1.93 m)   SpO2 98%   BMI 33.72 kg/m   BP Readings from Last 3 Encounters:  02/12/22 118/78  01/12/22 124/86  12/09/21 138/84    Wt Readings from Last 3 Encounters:  01/12/22 277 lb (125.6 kg)  12/09/21 271 lb 9.6 oz (123.2 kg)  11/11/21 287 lb (130.2 kg)    Physical Exam Vitals reviewed.  Eyes:     General: No scleral icterus.    Conjunctiva/sclera: Conjunctivae normal.  Cardiovascular:     Rate and Rhythm: Normal rate. Rhythm irregularly  irregular.     Pulses: Normal pulses.     Heart sounds: No murmur heard.    No friction rub. No gallop.  Pulmonary:     Effort: Pulmonary effort is normal.     Breath sounds: No stridor. No wheezing, rhonchi or rales.  Abdominal:     General: Abdomen is protuberant. Bowel sounds are normal.     Palpations: There is no hepatomegaly, splenomegaly or mass.     Tenderness: There is no abdominal tenderness.  Musculoskeletal:        General: Normal range of motion.     Cervical back: Neck supple.     Thoracic back: Normal.     Lumbar back: Normal. No deformity, tenderness or bony tenderness. Normal range of motion. Negative right straight leg raise test and negative left straight leg raise test.     Right lower leg: No edema.     Left lower leg: No edema.  Lymphadenopathy:     Cervical: No cervical adenopathy.  Skin:    General: Skin is warm and dry.     Findings: No rash.  Neurological:     General: No focal deficit present.     Mental Status: He is alert.     Cranial Nerves: Cranial nerves 2-12 are intact.     Sensory: Sensation is intact.     Motor: Motor function is intact. No weakness.     Coordination: Coordination abnormal.     Deep Tendon Reflexes: Reflexes normal.     Reflex Scores:      Tricep reflexes are 0 on the right side and 0 on the left side.      Bicep reflexes are 0 on the right side and 0 on the left side.      Brachioradialis reflexes are 0 on the right side and 0 on the left side.      Patellar reflexes are 0 on the right side and 0 on the left side.      Achilles reflexes are 0 on the right side and 0 on the left side. Psychiatric:        Behavior: Behavior normal.     Lab Results  Component Value Date   WBC 10.9 (H) 02/12/2022   HGB 15.5 02/12/2022   HCT 45.2 02/12/2022   PLT 172 02/12/2022   GLUCOSE 71 02/12/2022   CHOL 135 02/03/2021   TRIG 114.0 02/03/2021   HDL 32.80 (L) 02/03/2021   LDLDIRECT 138 (H) 10/21/2016   LDLCALC 80 02/03/2021   ALT  9 09/26/2021   AST 14 (L) 09/26/2021   NA 137 02/12/2022   K 4.0 02/12/2022   CL 99 02/12/2022   CREATININE 1.06 02/12/2022   BUN 25 02/12/2022   CO2 31 (H) 02/12/2022   TSH 2.01 02/03/2021   PSA  1.64 12/25/2020   INR 2.1 04/01/2018   HGBA1C 5.7 07/09/2021    No results found.  Assessment & Plan:   Jerrol was seen today for back pain.  Diagnoses and all orders for this visit:  Spinal stenosis, lumbar region with neurogenic claudication-  He is neurologically intact. Will treat the pain. -     HYDROcodone-acetaminophen (NORCO/VICODIN) 5-325 MG tablet; Take 1 tablet by mouth every 6 (six) hours as needed for moderate pain.  Seasonal allergic rhinitis due to pollen -     fluticasone (FLONASE) 50 MCG/ACT nasal spray; Place 2 sprays into both nostrils daily.  Cognitive decline- He wants to increase the dose. -     memantine (NAMENDA) 10 MG tablet; Take 1 tablet (10 mg total) by mouth 2 (two) times daily.   I have discontinued Irby B. Astorino Jr.'s memantine, diphenhydramine-acetaminophen, and predniSONE. I am also having him start on HYDROcodone-acetaminophen and memantine. Additionally, I am having him maintain his irbesartan, atorvastatin, carvedilol, dutasteride, escitalopram, gabapentin, potassium chloride SA, sucralfate, amLODipine, triamcinolone cream, Eliquis, spironolactone, tamsulosin, and fluticasone.  Meds ordered this encounter  Medications   fluticasone (FLONASE) 50 MCG/ACT nasal spray    Sig: Place 2 sprays into both nostrils daily.    Dispense:  48 g    Refill:  1   HYDROcodone-acetaminophen (NORCO/VICODIN) 5-325 MG tablet    Sig: Take 1 tablet by mouth every 6 (six) hours as needed for moderate pain.    Dispense:  90 tablet    Refill:  0   memantine (NAMENDA) 10 MG tablet    Sig: Take 1 tablet (10 mg total) by mouth 2 (two) times daily.    Dispense:  180 tablet    Refill:  1     Follow-up: Return in about 3 months (around 05/13/2022).  Scarlette Calico, MD

## 2022-02-20 ENCOUNTER — Telehealth (HOSPITAL_COMMUNITY): Payer: Self-pay | Admitting: *Deleted

## 2022-02-20 NOTE — Telephone Encounter (Signed)
Reaching out to patient to offer assistance regarding upcoming cardiac imaging study; pt verbalizes understanding of appt date/time, parking situation and where to check in, pre-test NPO status, and verified current allergies; name and call back number provided for further questions should they arise  Brandon Clement RN Navigator Cardiac Imaging [facility] [phone] office [phone] cell  Patient to take his daily medications two hours prior to his cardiac CT scan.

## 2022-02-23 ENCOUNTER — Ambulatory Visit (HOSPITAL_BASED_OUTPATIENT_CLINIC_OR_DEPARTMENT_OTHER)
Admission: RE | Admit: 2022-02-23 | Discharge: 2022-02-23 | Disposition: A | Discharge status: Home / Self Care | Payer: Medicare Other | Source: Ambulatory Visit | Attending: Cardiovascular Disease | Admitting: Cardiovascular Disease

## 2022-02-23 ENCOUNTER — Encounter (HOSPITAL_BASED_OUTPATIENT_CLINIC_OR_DEPARTMENT_OTHER): Payer: Self-pay

## 2022-02-23 DIAGNOSIS — I480 Paroxysmal atrial fibrillation: Secondary | ICD-10-CM

## 2022-02-23 MED ORDER — IOHEXOL 350 MG/ML SOLN
100.0000 mL | Freq: Once | INTRAVENOUS | Status: AC | PRN
  Administered 2022-02-23: 80 mL via INTRAVENOUS

## 2022-02-24 ENCOUNTER — Other Ambulatory Visit: Payer: Self-pay | Admitting: Internal Medicine

## 2022-02-24 NOTE — Telephone Encounter (Signed)
Key: [id number]

## 2022-02-26 ENCOUNTER — Other Ambulatory Visit: Payer: Self-pay | Admitting: Internal Medicine

## 2022-02-26 ENCOUNTER — Other Ambulatory Visit (HOSPITAL_COMMUNITY): Payer: Self-pay

## 2022-02-26 DIAGNOSIS — E876 Hypokalemia: Secondary | ICD-10-CM

## 2022-02-26 DIAGNOSIS — E269 Hyperaldosteronism, unspecified: Secondary | ICD-10-CM

## 2022-02-26 NOTE — Pre-Procedure Instructions (Signed)
Instructed patient on the following items: Arrival time 0830 Nothing to eat or drink after midnight No meds AM of procedure Responsible person to drive you home and stay with you for 24 hrs  Have you missed any doses of anti-coagulant Eliquis- hasn't missed any doses   

## 2022-02-27 ENCOUNTER — Ambulatory Visit (HOSPITAL_COMMUNITY): Payer: Medicare Other

## 2022-02-27 ENCOUNTER — Other Ambulatory Visit (HOSPITAL_COMMUNITY): Payer: Self-pay

## 2022-02-27 ENCOUNTER — Ambulatory Visit (HOSPITAL_BASED_OUTPATIENT_CLINIC_OR_DEPARTMENT_OTHER): Payer: Medicare Other | Admitting: Anesthesiology

## 2022-02-27 ENCOUNTER — Ambulatory Visit (HOSPITAL_COMMUNITY)
Admission: RE | Admit: 2022-02-27 | Discharge: 2022-02-27 | Disposition: A | Discharge status: Home / Self Care | Payer: Medicare Other | Attending: Cardiovascular Disease | Admitting: Cardiovascular Disease

## 2022-02-27 ENCOUNTER — Ambulatory Visit (HOSPITAL_COMMUNITY): Payer: Medicare Other | Admitting: Anesthesiology

## 2022-02-27 ENCOUNTER — Ambulatory Visit (HOSPITAL_COMMUNITY)
Admission: RE | Disposition: A | Discharge status: Home / Self Care | Payer: Self-pay | Source: Home / Self Care | Attending: Cardiovascular Disease

## 2022-02-27 ENCOUNTER — Other Ambulatory Visit: Payer: Self-pay

## 2022-02-27 DIAGNOSIS — I10 Essential (primary) hypertension: Secondary | ICD-10-CM

## 2022-02-27 DIAGNOSIS — F418 Other specified anxiety disorders: Secondary | ICD-10-CM

## 2022-02-27 DIAGNOSIS — Z87891 Personal history of nicotine dependence: Secondary | ICD-10-CM | POA: Diagnosis not present

## 2022-02-27 DIAGNOSIS — R0602 Shortness of breath: Secondary | ICD-10-CM | POA: Diagnosis not present

## 2022-02-27 DIAGNOSIS — G4730 Sleep apnea, unspecified: Secondary | ICD-10-CM | POA: Insufficient documentation

## 2022-02-27 DIAGNOSIS — I4819 Other persistent atrial fibrillation: Secondary | ICD-10-CM | POA: Insufficient documentation

## 2022-02-27 DIAGNOSIS — I4891 Unspecified atrial fibrillation: Secondary | ICD-10-CM

## 2022-02-27 HISTORY — PX: ATRIAL FIBRILLATION ABLATION: EP1191

## 2022-02-27 LAB — POCT ACTIVATED CLOTTING TIME: Activated Clotting Time: 363 seconds

## 2022-02-27 SURGERY — ATRIAL FIBRILLATION ABLATION
Anesthesia: General

## 2022-02-27 MED ORDER — SODIUM CHLORIDE 0.9% FLUSH: 3.0000 mL | INTRAVENOUS | Status: DC | PRN

## 2022-02-27 MED ORDER — DEXAMETHASONE SODIUM PHOSPHATE 10 MG/ML IJ SOLN
INTRAMUSCULAR | Status: DC | PRN
  Administered 2022-02-27: 10 mg via INTRAVENOUS

## 2022-02-27 MED ORDER — ONDANSETRON HCL 4 MG/2ML IJ SOLN: 4.0000 mg | Freq: Four times a day (QID) | INTRAMUSCULAR | Status: DC | PRN

## 2022-02-27 MED ORDER — HEPARIN SODIUM (PORCINE) 1000 UNIT/ML IJ SOLN
INTRAMUSCULAR | Status: AC
  Filled 2022-02-27: qty 10

## 2022-02-27 MED ORDER — HEPARIN SODIUM (PORCINE) 1000 UNIT/ML IJ SOLN
INTRAMUSCULAR | Status: DC | PRN
  Administered 2022-02-27: 1000 [IU] via INTRAVENOUS

## 2022-02-27 MED ORDER — COLCHICINE 0.6 MG PO TABS
0.6000 mg | ORAL_TABLET | Freq: Two times a day (BID) | ORAL | 0 refills | Status: DC
  Filled 2022-02-27: qty 10, 5d supply, fill #0

## 2022-02-27 MED ORDER — HEPARIN SODIUM (PORCINE) 1000 UNIT/ML IJ SOLN
INTRAMUSCULAR | Status: DC | PRN
  Administered 2022-02-27: 20000 [IU] via INTRAVENOUS

## 2022-02-27 MED ORDER — FENTANYL CITRATE (PF) 100 MCG/2ML IJ SOLN
25.0000 ug | INTRAMUSCULAR | Status: DC | PRN
  Administered 2022-02-27 (×2): 50 ug via INTRAVENOUS

## 2022-02-27 MED ORDER — SODIUM CHLORIDE 0.9 % IV SOLN: INTRAVENOUS | Status: DC

## 2022-02-27 MED ORDER — OXYCODONE HCL 5 MG PO TABS: 5.0000 mg | ORAL_TABLET | Freq: Once | ORAL | Status: DC | PRN

## 2022-02-27 MED ORDER — LIDOCAINE 2% (20 MG/ML) 5 ML SYRINGE
INTRAMUSCULAR | Status: DC | PRN
  Administered 2022-02-27: 60 mg via INTRAVENOUS

## 2022-02-27 MED ORDER — SODIUM CHLORIDE 0.9% FLUSH: 3.0000 mL | Freq: Two times a day (BID) | INTRAVENOUS | Status: DC

## 2022-02-27 MED ORDER — AMIODARONE HCL 150 MG/3ML IV SOLN
INTRAVENOUS | Status: AC
  Filled 2022-02-27: qty 3

## 2022-02-27 MED ORDER — ONDANSETRON HCL 4 MG/2ML IJ SOLN
INTRAMUSCULAR | Status: DC | PRN
  Administered 2022-02-27: 4 mg via INTRAVENOUS

## 2022-02-27 MED ORDER — FUROSEMIDE 10 MG/ML IJ SOLN
40.0000 mg | Freq: Once | INTRAMUSCULAR | Status: AC
  Administered 2022-02-27: 40 mg via INTRAVENOUS

## 2022-02-27 MED ORDER — PHENYLEPHRINE HCL-NACL 20-0.9 MG/250ML-% IV SOLN
INTRAVENOUS | Status: DC | PRN
  Administered 2022-02-27: 25 ug/min via INTRAVENOUS

## 2022-02-27 MED ORDER — ALPRAZOLAM 0.25 MG PO TABS
ORAL_TABLET | ORAL | Status: AC
  Filled 2022-02-27: qty 2

## 2022-02-27 MED ORDER — FENTANYL CITRATE (PF) 250 MCG/5ML IJ SOLN
INTRAMUSCULAR | Status: DC | PRN
  Administered 2022-02-27 (×2): 50 ug via INTRAVENOUS

## 2022-02-27 MED ORDER — FUROSEMIDE 10 MG/ML IJ SOLN
INTRAMUSCULAR | Status: AC
  Filled 2022-02-27: qty 4

## 2022-02-27 MED ORDER — ONDANSETRON HCL 4 MG/2ML IJ SOLN
INTRAMUSCULAR | Status: AC
  Filled 2022-02-27: qty 2

## 2022-02-27 MED ORDER — OXYCODONE HCL 5 MG/5ML PO SOLN: 5.0000 mg | Freq: Once | ORAL | Status: DC | PRN

## 2022-02-27 MED ORDER — PANTOPRAZOLE SODIUM 40 MG PO TBEC
40.0000 mg | DELAYED_RELEASE_TABLET | Freq: Every day | ORAL | 0 refills | Status: DC
  Filled 2022-02-27: qty 30, 30d supply, fill #0

## 2022-02-27 MED ORDER — FENTANYL CITRATE (PF) 100 MCG/2ML IJ SOLN
INTRAMUSCULAR | Status: AC
  Filled 2022-02-27: qty 2

## 2022-02-27 MED ORDER — HEPARIN (PORCINE) IN NACL 1000-0.9 UT/500ML-% IV SOLN
INTRAVENOUS | Status: DC | PRN
  Administered 2022-02-27 (×4): 500 mL

## 2022-02-27 MED ORDER — IPRATROPIUM-ALBUTEROL 0.5-2.5 (3) MG/3ML IN SOLN
RESPIRATORY_TRACT | Status: AC
  Filled 2022-02-27: qty 3

## 2022-02-27 MED ORDER — ALPRAZOLAM 0.25 MG PO TABS
0.5000 mg | ORAL_TABLET | Freq: Once | ORAL | Status: AC
  Administered 2022-02-27: 0.5 mg via ORAL

## 2022-02-27 MED ORDER — ALBUTEROL SULFATE (2.5 MG/3ML) 0.083% IN NEBU: 2.5000 mg | INHALATION_SOLUTION | Freq: Once | RESPIRATORY_TRACT | Status: DC

## 2022-02-27 MED ORDER — ROCURONIUM BROMIDE 10 MG/ML (PF) SYRINGE
PREFILLED_SYRINGE | INTRAVENOUS | Status: DC | PRN
  Administered 2022-02-27 (×2): 60 mg via INTRAVENOUS

## 2022-02-27 MED ORDER — MIDAZOLAM HCL 2 MG/2ML IJ SOLN
1.0000 mg | Freq: Once | INTRAMUSCULAR | Status: AC
  Administered 2022-02-27: 1 mg via INTRAVENOUS

## 2022-02-27 MED ORDER — PHENYLEPHRINE 80 MCG/ML (10ML) SYRINGE FOR IV PUSH (FOR BLOOD PRESSURE SUPPORT)
PREFILLED_SYRINGE | INTRAVENOUS | Status: DC | PRN
  Administered 2022-02-27: 160 ug via INTRAVENOUS
  Administered 2022-02-27: 120 ug via INTRAVENOUS
  Administered 2022-02-27: 160 ug via INTRAVENOUS

## 2022-02-27 MED ORDER — ONDANSETRON HCL 4 MG/2ML IJ SOLN
4.0000 mg | Freq: Once | INTRAMUSCULAR | Status: AC | PRN
  Administered 2022-02-27: 4 mg via INTRAVENOUS

## 2022-02-27 MED ORDER — SUGAMMADEX SODIUM 200 MG/2ML IV SOLN
INTRAVENOUS | Status: DC | PRN
  Administered 2022-02-27: 200 mg via INTRAVENOUS

## 2022-02-27 MED ORDER — SODIUM CHLORIDE 0.9 % IV SOLN: 250.0000 mL | INTRAVENOUS | Status: DC | PRN

## 2022-02-27 MED ORDER — MIDAZOLAM HCL 2 MG/2ML IJ SOLN
INTRAMUSCULAR | Status: AC
  Filled 2022-02-27: qty 2

## 2022-02-27 MED ORDER — AMIODARONE IV BOLUS ONLY 150 MG/100ML
INTRAVENOUS | Status: DC | PRN
  Administered 2022-02-27: 150 mg via INTRAVENOUS

## 2022-02-27 MED ORDER — PROPOFOL 10 MG/ML IV BOLUS
INTRAVENOUS | Status: DC | PRN
  Administered 2022-02-27: 200 mg via INTRAVENOUS

## 2022-02-27 MED ORDER — PROTAMINE SULFATE 10 MG/ML IV SOLN
INTRAVENOUS | Status: DC | PRN
  Administered 2022-02-27: 30 mg via INTRAVENOUS
  Administered 2022-02-27: 20 mg via INTRAVENOUS

## 2022-02-27 MED ORDER — ACETAMINOPHEN 325 MG PO TABS: 650.0000 mg | ORAL_TABLET | ORAL | Status: DC | PRN

## 2022-02-27 SURGICAL SUPPLY — 20 items
BAG SNAP BAND KOVER 36X36 (MISCELLANEOUS) IMPLANT
CATH 8FR REPROCESSED SOUNDSTAR (CATHETERS) ×1 IMPLANT
CATH 8FR SOUNDSTAR REPROCESSED (CATHETERS) IMPLANT
CATH ABLAT QDOT MICRO BI TC DF (CATHETERS) IMPLANT
CATH OCTARAY 2.0 F 3-3-3-3-3 (CATHETERS) IMPLANT
CATH PIGTAIL STEERABLE D1 8.7 (WIRE) IMPLANT
CATH S-M CIRCA TEMP PROBE (CATHETERS) IMPLANT
CATH WEB BI DIR CSDF CRV REPRO (CATHETERS) IMPLANT
CLOSURE PERCLOSE PROSTYLE (VASCULAR PRODUCTS) IMPLANT
COVER SWIFTLINK CONNECTOR (BAG) ×1 IMPLANT
DEVICE CLOSURE MYNXGRIP 6/7F (Vascular Products) IMPLANT
PACK EP LATEX FREE (CUSTOM PROCEDURE TRAY) ×1
PACK EP LF (CUSTOM PROCEDURE TRAY) ×1 IMPLANT
PAD DEFIB RADIO PHYSIO CONN (PAD) ×1 IMPLANT
PATCH CARTO3 (PAD) IMPLANT
SHEATH CARTO VIZIGO MED CURVE (SHEATH) IMPLANT
SHEATH PINNACLE 8F 10CM (SHEATH) IMPLANT
SHEATH PINNACLE 9F 10CM (SHEATH) IMPLANT
SHEATH PROBE COVER 6X72 (BAG) IMPLANT
TUBING SMART ABLATE COOLFLOW (TUBING) IMPLANT

## 2022-02-27 NOTE — H&P (Signed)
Electrophysiology Office Note:    Date:  02/27/2022   ID:  Brandon Dohn., DOB 12/16/1950, MRN [id number]  PCP:  Brandon Lima, MD   [place] Providers Cardiologist:  Brandon Peru, MD Electrophysiologist:  Brandon Quitter, MD     Referring MD: No ref. provider found   History of Present Illness:    Brandon Minckler. is a 72 y.o. male with a hx listed below, significant for persistent atrial fibrillation, AVNRT s/p ablation referred for arrhythmia management.  He underwent an ablation by Dr. Rayann Heman for AF in the past. He has also had an ablation for AVNRT by Dr.  Lovena Le.  He lives in [place].  He notes that he is very easily fatigued and is not nearly as capable as he was prior to recurrence of atrial fibrillation.  He denies chest pain, syncope, presyncope.  02/27/2022 I reviewed the patient's CT and labs. There was no LAA thrombus. he  has not missed any doses of anticoagulation, and he took his dose last night. There have been no changes in the patient's diagnoses, medications, or condition since our recent clinic visit.   Past Medical History:  Diagnosis Date   Anxiety    Arthritis    Benign neoplasm of colon    Benign prostatic hyperplasia with urinary obstruction    Bilateral inguinal hernia 05/29/2011   Bilateral pulmonary embolism ([place]) 3/14   admitted to [facility],  treated with Xarelto   Breast lump 03/29/2019   Calculus of kidney 10/21/2016   Cataract    Chronic bilateral low back pain with sciatica 03/21/2012   Chronic pain    Finger mass, right 10/17/2020   History of alcohol abuse 03/07/2016   History of colon polyps 11/18/2015   History of kidney stones    History of pulmonary embolism 03/19/2012   Hyperlipemia    Hypertension    Insomnia    Insomnia due to medical condition 12/23/2016   Polyuria secondary to BPH   Major depressive disorder, recurrent episode ([place])    Mitral regurgitation 04/24/2013   Mild by TEE   Morbid obesity ([place])  03/07/2016   Nonrheumatic mitral valve insufficiency 04/25/2013   Mild by TEE  Overview:  Overview:  Mild by TEE Overview:  Overview:  Overview:  Mild by TEE Overview:  Overview:  Mild by TEE   Obesity    OSA (obstructive sleep apnea)    noncompliant with CPAP.  07/27/13- awaiting a CPAP- unable to tolerate mask   Osteoporosis    Persistent atrial fibrillation (HCC)    Recurrent unilateral inguinal hernia 07/17/2013   Restless leg syndrome    takes gabapentin   S/P right total knee arthroplasty 05/24/2018   Sciatica    Spinal stenosis, lumbar region with neurogenic claudication    SVT (supraventricular tachycardia) 03/07/2016   Thrombus of left atrial appendage [id number]   Uncomplicated asthma [id number]   Overview:  Overview:  Overview:  Overview:  Qualifier: Diagnosis of  By: Sherren Mocha MD, Jory Ee Overview:  Overview:  Qualifier: Diagnosis of  By: Sherren Mocha MD, Jory Ee   Ventral hernia, unspecified, without mention of obstruction or gangrene    right abdominal wall    Past Surgical History:  Procedure Laterality Date   CARDIOVERSION N/A 03/21/2012   Procedure: CARDIOVERSION;  Surgeon: Birdie Riddle, MD;  Location: [place];  Service: Cardiovascular;  Laterality: N/A;   CARDIOVERSION N/A 04/24/2013   Procedure: CARDIOVERSION;  Surgeon: Dorothy Spark, MD;  Location: [place];  Service: Cardiovascular;  Laterality: N/A;   CARDIOVERSION N/A 06/27/2015   Procedure: CARDIOVERSION;  Surgeon: Larey Dresser, MD;  Location: [place];  Service: Cardiovascular;  Laterality: N/A;   CARDIOVERSION N/A 11/11/2021   Procedure: CARDIOVERSION;  Surgeon: Lelon Perla, MD;  Location: [facility] ENDOSCOPY;  Service: Cardiovascular;  Laterality: N/A;   CATARACT EXTRACTION     COLONOSCOPY W/ POLYPECTOMY     ELECTROPHYSIOLOGIC STUDY N/A 07/30/2015   Procedure: Atrial Fibrillation Ablation;  Surgeon: Thompson Grayer, MD;  Location: [place] CV LAB;  Service: Cardiovascular;  Laterality: N/A;    EXTRACORPOREAL SHOCK WAVE LITHOTRIPSY Right 10/29/2016   Procedure: RIGHT EXTRACORPOREAL SHOCK WAVE LITHOTRIPSY (ESWL);  Surgeon: Alexis Frock, MD;  Location: [facility];  Service: Urology;  Laterality: Right;   EYE SURGERY Right    cataract   FEMUR FRACTURE SURGERY     HERNIA REPAIR  07/06/2011   INGUINAL HERNIA REPAIR Right 07/28/2013   Procedure: RIGHT INGUINAL HERNIA REPAIR;  Surgeon: Imogene Burn. Georgette Dover, MD;  Location: [place];  Service: General;  Laterality: Right;   INGUINAL HERNIA REPAIR Right 09/22/2016   Procedure: LAPAROSCOPIC RIGHT INGUINAL HERNIA;  Surgeon: Kieth Brightly Arta Bruce, MD;  Location: [facility];  Service: General;  Laterality: Right;  With MESH   INSERTION OF MESH Right 07/28/2013   Procedure: INSERTION OF MESH;  Surgeon: Imogene Burn. Georgette Dover, MD;  Location: [place];  Service: General;  Laterality: Right;   KNEE ARTHROSCOPY     left   REPLACEMENT TOTAL KNEE Left    ROTATOR CUFF REPAIR     right   ROTATOR CUFF REPAIR Left 03/08/2014   DR SUPPLE   SHOULDER ARTHROSCOPY WITH ROTATOR CUFF REPAIR AND SUBACROMIAL DECOMPRESSION Left 03/08/2014   Procedure: LEFT SHOULDER ARTHROSCOPY WITH ROTATOR CUFF REPAIR/SUBACROMIAL DECOMPRESSION/DISTAL CLAVICLE RESECTION;  Surgeon: Marin Shutter, MD;  Location: [place];  Service: Orthopedics;  Laterality: Left;   SVT ABLATION N/A 01/23/2019   Procedure: SVT ABLATION;  Surgeon: Evans Lance, MD;  Location: [place] CV LAB;  Service: Cardiovascular;  Laterality: N/A;   TEE WITHOUT CARDIOVERSION N/A 03/21/2012   Procedure: TRANSESOPHAGEAL ECHOCARDIOGRAM (TEE);  Surgeon: Birdie Riddle, MD;  Location: [place];  Service: Cardiovascular;  Laterality: N/A;   TEE WITHOUT CARDIOVERSION N/A 04/24/2013   Procedure: TRANSESOPHAGEAL ECHOCARDIOGRAM (TEE);  Surgeon: Dorothy Spark, MD;  Location: [place];  Service: Cardiovascular;  Laterality: N/A;   TEE WITHOUT CARDIOVERSION N/A 07/29/2015   Procedure: TRANSESOPHAGEAL ECHOCARDIOGRAM (TEE);  Surgeon: Fay Records, MD;  Location: [place];  Service: Cardiovascular;  Laterality: N/A;   TONSILLECTOMY     TOTAL KNEE ARTHROPLASTY Right 05/24/2018   Procedure: RIGHT TOTAL KNEE ARTHROPLASTY;  Surgeon: Paralee Cancel, MD;  [facility];  Service: Orthopedics;  Laterality: Right;  70 mins    Current Medications: Current Meds  Medication Sig   amLODipine (NORVASC) 5 MG tablet Take 1 tablet (5 mg total) by mouth daily.   apixaban (ELIQUIS) 5 MG TABS tablet TAKE 1 TABLET BY MOUTH TWICE A DAY   atorvastatin (LIPITOR) 40 MG tablet Take 1 tablet (40 mg total) by mouth daily.   carvedilol (COREG) 25 MG tablet Take 1 tablet (25 mg total) by mouth 2 (two) times daily with a meal.   dutasteride (AVODART) 0.5 MG capsule Take 0.5 mg by mouth daily.   escitalopram (LEXAPRO) 20 MG tablet Take 1 tablet (20 mg total) by mouth daily.   gabapentin (NEURONTIN) 800 MG tablet Take 1 tablet (  800 mg total) by mouth 3 (three) times daily.   HYDROcodone-acetaminophen (NORCO/VICODIN) 5-325 MG tablet Take 1 tablet by mouth every 6 (six) hours as needed for moderate pain.   irbesartan (AVAPRO) 300 MG tablet Take 1 tablet (300 mg total) by mouth daily.   memantine (NAMENDA) 10 MG tablet Take 1 tablet (10 mg total) by mouth 2 (two) times daily.   potassium chloride SA (KLOR-CON M20) 20 MEQ tablet TAKE 1 TABLET BY MOUTH EVERY DAY   spironolactone (ALDACTONE) 25 MG tablet TAKE 1 TABLET (25 MG TOTAL) BY MOUTH DAILY.   tamsulosin (FLOMAX) 0.4 MG CAPS capsule TAKE 1 CAPSULE BY MOUTH 2 TIMES DAILY.   triamcinolone cream (KENALOG) 0.1 % Apply 1 Application topically 2 (two) times daily as needed (rash).   [DISCONTINUED] spironolactone (ALDACTONE) 25 MG tablet TAKE 1 TABLET (25 MG TOTAL) BY MOUTH DAILY.     Allergies:   Ace inhibitors and Albuterol   Social History   Socioeconomic History   Marital status: Married    Spouse name: Not on file   Number of children: 2   Years of education: college   Highest education level: Not  on file  Occupational History   Not on file  Tobacco Use   Smoking status: Former    Packs/day: 0.50    Years: 5.00    Total pack years: 2.50    Types: Cigarettes    Quit date: 22    Years since quitting: 48.1   Smokeless tobacco: Never  Vaping Use   Vaping Use: Never used  Substance and Sexual Activity   Alcohol use: No    Comment: Former EtOH abuse, stopped 06/2016   Drug use: No    Comment: negative hx for IV drug abuse   Sexual activity: Not on file  Other Topics Concern   Not on file  Social History Narrative   Lives with wife in [place]   Retired Dietitian   Wife lives in town (still married but lives separate)   Has biological daughter in [place] ([place])   Social Determinants of Health   Financial Resource Strain: [place]  (12/10/2021)   Overall Financial Resource Strain (CARDIA)    Difficulty of Paying Living Expenses: Not hard at all  Food Insecurity: No [place] (12/10/2021)   Hunger Vital Sign    Worried About Running Out of Food in the Last Year: Never true    Portal in the Last Year: Never true  Transportation Needs: No Transportation Needs (12/10/2021)   PRAPARE - Hydrologist (Medical): No    Lack of Transportation (Non-Medical): No  Physical Activity: Inactive (12/10/2021)   Exercise Vital Sign    Days of Exercise per Week: 0 days    Minutes of Exercise per Session: 0 min  Stress: No Stress Concern Present (12/10/2021)   [place]    Feeling of Stress : Not at all  Social Connections: Moderately Integrated (12/10/2021)   Social Connection and Isolation Panel [NHANES]    Frequency of Communication with Friends and Family: More than three times a week    Frequency of Social Gatherings with Friends and Family: Once a week    Attends Religious Services: 1 to 4 times per year    Active Member of [employer] or Organizations: Yes     Attends Archivist Meetings: 1 to 4 times per year  Marital Status: Separated     Family History: The patient's family history includes Asthma in his mother; CVA in an other family member; Cancer in his father and paternal grandmother; Diabetes in an other family member; Heart failure in his mother; Hypertension in his mother.  ROS:   Please see the history of present illness.    All other systems reviewed and are negative.  EKGs/Labs/Other Studies Reviewed Today:      EKG:  Last EKG results: today - AF  TTE: severe LAE   Recent Labs: 09/26/2021: ALT 9 10/19/2021: Magnesium 1.8 02/12/2022: BUN 25; Creatinine, Ser 1.06; Hemoglobin 15.5; Platelets 172; Potassium 4.0; Sodium 137     Physical Exam:    VS:  BP (!) 138/98   Pulse 77   Temp 98.2 F (36.8 C) (Oral)   Resp 18   Ht '6\' 4"'$  (1.93 m)   Wt 124.7 kg   SpO2 94%   BMI 33.47 kg/m     Wt Readings from Last 3 Encounters:  02/27/22 124.7 kg  01/12/22 125.6 kg  12/09/21 123.2 kg     GEN: Well nourished, well developed in no acute distress CARDIAC: Irregular rhythm, no murmurs, rubs, gallops RESPIRATORY:  Normal work of breathing MUSCULOSKELETAL: no edema    ASSESSMENT & PLAN:    Persistent AF: Status post ablation by Dr. Rayann Heman.  Very symptomatic.  I think it reasonable to repeat EP study to see if his pulmonary veins were reconnected.  I will also plan to investigate the posterior wall, and will likely perform a posterior wall ablation.  AVNRT: s/p ablation         Medication Adjustments/Labs and Tests Ordered: Current medicines are reviewed at length with the patient today.  Concerns regarding medicines are outlined above.  Orders Placed This Encounter  Procedures   Informed Consent Details: Physician/Practitioner Attestation; Transcribe to consent form and obtain patient signature   Initiate Pre-op Protocol   Void on call to EP Lab   Confirm CBC and BMP (or CMP) results within 7 days for  inpatient and 30 days for outpatient:   Clip right and left femoral area PM before surgery   Clip right internal jugular area PM before surgery   Pre-admission testing diagnosis   EP STUDY   Insert peripheral IV   Meds ordered this encounter  Medications   0.9 %  sodium chloride infusion     Signed, Brandon Quitter, MD  02/27/2022 10:13 AM    [place]

## 2022-02-27 NOTE — Anesthesia Procedure Notes (Signed)
Procedure Name: Intubation Date/Time: 02/27/2022 10:52 AM  Performed by: Lavell Luster, CRNAPre-anesthesia Checklist: Patient identified, Emergency Drugs available, Suction available, Patient being monitored and Timeout performed Patient Re-evaluated:Patient Re-evaluated prior to induction Oxygen Delivery Method: Circle system utilized Preoxygenation: Pre-oxygenation with 100% oxygen Induction Type: IV induction Ventilation: Mask ventilation without difficulty Laryngoscope Size: Mac and 4 Grade View: Grade II Tube type: Oral Tube size: 7.5 mm Number of attempts: 1 Airway Equipment and Method: Stylet Placement Confirmation: ETT inserted through vocal cords under direct vision, positive ETCO2 and breath sounds checked- equal and bilateral Secured at: 22 cm Tube secured with: Tape Dental Injury: Teeth and Oropharynx as per pre-operative assessment

## 2022-02-27 NOTE — Progress Notes (Signed)
Much calmer.

## 2022-02-27 NOTE — Progress Notes (Addendum)
Patient anxious, crying, c/o mid back pain 8/10. States he wants to go outside and get some fresh air. Attempted many times to reposition with pillows and blankets. Medicated w/fentanyl per anesthesia prn order

## 2022-02-27 NOTE — Progress Notes (Signed)
Patient bent rt leg; rebled. Manual pressure held x 15 minutes. Level 0. Redressed. Patient emotional, crying. Xanax 0.'5mg'$  po given at 1445 per order. Dr. Ambrose Pancoast in several times to see patient.

## 2022-02-27 NOTE — Transfer of Care (Signed)
Immediate Anesthesia Transfer of Care Note  Patient: Brandon B Feighner Jr.  Procedure(s) Performed: ATRIAL FIBRILLATION ABLATION  Patient Location: PACU  Anesthesia Type:General  Level of Consciousness: awake, alert , and oriented  Airway & Oxygen Therapy: Patient connected to nasal cannula oxygen  Post-op Assessment: Post -op Vital signs reviewed and stable  Post vital signs: stable  Last Vitals:  Vitals Value Taken Time  BP 139/89 02/27/22 1304  Temp    Pulse 68 02/27/22 1306  Resp 26 02/27/22 1306  SpO2 95 % 02/27/22 1306  Vitals shown include unvalidated device data.  Last Pain:  Vitals:   02/27/22 0905  TempSrc:   PainSc: 0-No pain         Complications: No notable events documented.

## 2022-02-27 NOTE — Anesthesia Postprocedure Evaluation (Signed)
Anesthesia Post Note  Patient: Brandon [name].  Procedure(s) Performed: ATRIAL FIBRILLATION ABLATION     Patient location during evaluation: PACU Anesthesia Type: General Level of consciousness: awake and alert Pain management: pain level controlled Vital Signs Assessment: post-procedure vital signs reviewed and stable Respiratory status: spontaneous breathing, nonlabored ventilation, respiratory function stable and patient connected to nasal cannula oxygen Cardiovascular status: blood pressure returned to baseline and stable Postop Assessment: no apparent nausea or vomiting Anesthetic complications: no  No notable events documented.  Last Vitals:  Vitals:   02/27/22 1400 02/27/22 1415  BP: (!) 127/91 136/89  Pulse: 73 72  Resp: 19 14  Temp:    SpO2: 94% 99%    Last Pain:  Vitals:   02/27/22 1342  TempSrc: Temporal  PainSc: 8                  Takara Sermons

## 2022-02-27 NOTE — Discharge Instructions (Signed)
Post procedure care instructions No driving for 4 days. No lifting over 5 lbs for 1 week. No vigorous or sexual activity for 1 week. You may return to work/your usual activities on 03/07/22. Keep procedure site clean & dry. If you notice increased pain, swelling, bleeding or pus, call/return!  You may shower after 24 hours, but no soaking in baths/hot tubs/pools for 1 week.   You have an appointment set up with the [facility].  Multiple studies have shown that being followed by a dedicated atrial fibrillation clinic in addition to the standard care you receive from your other physicians improves health. We believe that enrollment in the atrial fibrillation clinic will allow [place] to better care for you.   The phone number to the [facility] is [phone]. The clinic is staffed Monday through Friday from 8:30am to 5pm.  Directions: The clinic is located in the [facility], [place] the hospital at the MAIN ENTRANCE "A", use [address] to the 6th floor.  Registration desk to the right of elevators on 6th floor  If you have any trouble locating the clinic, please don't hesitate to call [phone].

## 2022-02-27 NOTE — Anesthesia Preprocedure Evaluation (Addendum)
Anesthesia Evaluation  Patient identified by MRN, date of birth, ID band Patient awake    Reviewed: Allergy & Precautions, H&P , NPO status , Patient's Chart, lab work & pertinent test results  Airway Mallampati: I  TM Distance: >3 FB Neck ROM: Full    Dental no notable dental hx. (+) Teeth Intact, Dental Advisory Given, Caps   Pulmonary asthma , sleep apnea , former smoker   Pulmonary exam normal breath sounds clear to auscultation       Cardiovascular hypertension, Pt. on medications + dysrhythmias Atrial Fibrillation  Rhythm:Regular Rate:Normal  Lexiscan 10/23 Normal stress test, no evidence of heart blockages   Neuro/Psych   Anxiety Depression    negative neurological ROS  negative psych ROS   GI/Hepatic negative GI ROS, Neg liver ROS,,,  Endo/Other  negative endocrine ROS    Renal/GU negative Renal ROS  negative genitourinary   Musculoskeletal negative musculoskeletal ROS (+)    Abdominal   Peds negative pediatric ROS (+)  Hematology negative hematology ROS (+)   Anesthesia Other Findings   Reproductive/Obstetrics negative OB ROS                             Anesthesia Physical Anesthesia Plan  ASA: 3  Anesthesia Plan: General   Post-op Pain Management: Minimal or no pain anticipated   Induction: Intravenous  PONV Risk Score and Plan: 2 and Ondansetron, Dexamethasone and Treatment may vary due to age or medical condition  Airway Management Planned: Oral ETT  Additional Equipment: None  Intra-op Plan:   Post-operative Plan: Extubation in OR  Informed Consent: I have reviewed the patients History and Physical, chart, labs and discussed the procedure including the risks, benefits and alternatives for the proposed anesthesia with the patient or authorized representative who has indicated his/her understanding and acceptance.     Dental advisory given  Plan Discussed  with: CRNA and Anesthesiologist  Anesthesia Plan Comments: (See PAT note 05/17/18, Konrad Felix, PA-C  )        Anesthesia Quick Evaluation

## 2022-03-02 ENCOUNTER — Encounter (HOSPITAL_COMMUNITY): Payer: Self-pay | Admitting: Cardiovascular Disease

## 2022-03-02 MED FILL — Amiodarone HCl Inj 150 MG/3ML (50 MG/ML): INTRAVENOUS | Qty: 3 | Status: AC

## 2022-03-02 MED FILL — Fentanyl Citrate Preservative Free (PF) Inj 100 MCG/2ML: INTRAMUSCULAR | Qty: 2 | Status: AC

## 2022-03-02 MED FILL — Ipratropium-Albuterol Nebu Soln 0.5-2.5(3) MG/3ML: RESPIRATORY_TRACT | Qty: 3 | Status: AC

## 2022-03-24 ENCOUNTER — Encounter: Payer: Self-pay | Admitting: Internal Medicine

## 2022-03-27 ENCOUNTER — Ambulatory Visit (HOSPITAL_COMMUNITY)
Admission: RE | Admit: 2022-03-27 | Discharge: 2022-03-27 | Disposition: A | Discharge status: Home / Self Care | Payer: Medicare Other | Source: Ambulatory Visit | Attending: Physician Assistant | Admitting: Physician Assistant

## 2022-03-27 ENCOUNTER — Encounter (HOSPITAL_COMMUNITY): Payer: Self-pay | Admitting: Physician Assistant

## 2022-03-27 VITALS — BP 138/90 | HR 69 | Ht 76.0 in | Wt 277.6 lb

## 2022-03-27 DIAGNOSIS — Z7901 Long term (current) use of anticoagulants: Secondary | ICD-10-CM | POA: Insufficient documentation

## 2022-03-27 DIAGNOSIS — Z87891 Personal history of nicotine dependence: Secondary | ICD-10-CM | POA: Insufficient documentation

## 2022-03-27 DIAGNOSIS — Z79899 Other long term (current) drug therapy: Secondary | ICD-10-CM | POA: Diagnosis not present

## 2022-03-27 DIAGNOSIS — I4819 Other persistent atrial fibrillation: Secondary | ICD-10-CM | POA: Insufficient documentation

## 2022-03-27 DIAGNOSIS — I10 Essential (primary) hypertension: Secondary | ICD-10-CM | POA: Diagnosis not present

## 2022-03-27 DIAGNOSIS — Z96651 Presence of right artificial knee joint: Secondary | ICD-10-CM | POA: Insufficient documentation

## 2022-03-27 DIAGNOSIS — D6869 Other thrombophilia: Secondary | ICD-10-CM | POA: Diagnosis not present

## 2022-03-27 DIAGNOSIS — I471 Supraventricular tachycardia, unspecified: Secondary | ICD-10-CM | POA: Diagnosis not present

## 2022-03-27 NOTE — Progress Notes (Signed)
Primary Care Physician: [name], MD Referring Physician: Dr. Rayann Heman Primary EP: Dr Assunta Curtis Schumacher [name] is a 72 y.o. male with a h/o afib, s/p ablation, maintaining SR, off amiodarone. He had one episode of afib in July, none since then He has gained 20 lbs since Covid inactivity. He had rt knee replacement in May.  He recently had 2 separet ER visits for SVT with RVR and very hypotensive. The first time in ER, he was given adenosine and broke but with return of SVT, he was cardioverted. He went home only to have  the same scenario to repeat again the next day. This time he was kept and treated. These episodes  may have been set off by pt not having his coreg 25 mg bid for a week as he was waiting for refills. He was given amiodarone IV but when he returned to SR, this was stopped and he was gotten back on his BB. Pt states that Dr. Lovena Le wanted to see him after discharge  to  discuss SVT ablation. CHA2DS2VASc of 2, on eliquis.  F/u afib clinic, 06/14/19. He had an SVT ablation that was successful and he has not had any further tachyarrhythmia's. Overall he reports that he id soing well. Undergoing PT in after math of rt knee replacement. No issues with his anticoagulation.   Follow up in the AF clinic 03/27/22. Patient was seen in follow up by Tommye Standard on 10/21/21 and found to be in persistent afib. He underwent DCCV on 11/11/21 which was unsuccessful after 3 shocks. He was seen by Dr Myles Gip and underwent repeat ablation on 02/27/22. He reports that he has done very well since the ablation with no afib episodes. He denies chest pain, swallowing pain, or groin issues.   Today, he denies symptoms of palpitations, chest pain, shortness of breath, orthopnea, PND, lower extremity edema, dizziness, presyncope, syncope, or neurologic sequela. The patient is tolerating medications without difficulties and is otherwise without complaint today.   Past Medical History:  Diagnosis Date    Anxiety    Arthritis    Benign neoplasm of colon    Benign prostatic hyperplasia with urinary obstruction    Bilateral inguinal hernia 05/29/2011   Bilateral pulmonary embolism ([place]) 3/14   ad[facility] to [facility],  treated with Xarelto   Breast lump 03/29/2019   Calculus of kidney 10/21/2016   Cataract    Chronic bilateral low back pain with sciatica 03/21/2012   Chronic pain    Finger mass, right 10/17/2020   History of alcohol abuse 03/07/2016   History of colon polyps 11/18/2015   History of kidney stones    History of pulmonary embolism 03/19/2012   Hyperlipemia    Hypertension    Insomnia    Insomnia due to medical condition 12/23/2016   Polyuria secondary to BPH   Major depressive disorder, recurrent episode ([place])    Mitral regurgitation 04/24/2013   Mild by TEE   Morbid obesity ([place]) 03/07/2016   Nonrheumatic mitral valve insufficiency 04/25/2013   Mild by TEE  Overview:  Overview:  Mild by TEE Overview:  Overview:  Overview:  Mild by TEE Overview:  Overview:  Mild by TEE   Obesity    OSA (obstructive sleep apnea)    noncompliant with CPAP.  07/27/13- awaiting a CPAP- unable to tolerate mask   Osteoporosis    Persistent atrial fibrillation (HCC)    Recurrent unilateral inguinal hernia 07/17/2013   Restless  leg syndrome    takes gabapentin   S/P right total knee arthroplasty 05/24/2018   Sciatica    Spinal stenosis, lumbar region with neurogenic claudication    SVT (supraventricular tachycardia) 03/07/2016   Thrombus of left atrial appendage [id number]   Uncomplicated asthma [id number]   Overview:  Overview:  Overview:  Overview:  Qualifier: Diagnosis of  By: Sherren Mocha MD, Jory Ee Overview:  Overview:  Qualifier: Diagnosis of  By: Sherren Mocha MD, Jory Ee   Ventral hernia, unspecified, without mention of obstruction or gangrene    right abdominal wall   Past Surgical History:  Procedure Laterality Date   ATRIAL FIBRILLATION ABLATION N/A 02/27/2022   Procedure: ATRIAL FIBRILLATION  ABLATION;  Surgeon: Melida Quitter, MD;  Location: [facility];  Service: Cardiovascular;  Laterality: N/A;   CARDIOVERSION N/A 03/21/2012   Procedure: CARDIOVERSION;  Surgeon: Birdie Riddle, MD;  Location: [facility];  Service: Cardiovascular;  Laterality: N/A;   CARDIOVERSION N/A 04/24/2013   Procedure: CARDIOVERSION;  Surgeon: Dorothy Spark, MD;  Location: [facility] END[facility]COPY;  Service: Cardiovascular;  Laterality: N/A;   CARDIOVERSION N/A 06/27/2015   Procedure: CARDIOVERSION;  Surgeon: Larey Dresser, MD;  Location: [facility];  Service: Cardiovascular;  Laterality: N/A;   CARDIOVERSION N/A 11/11/2021   Procedure: CARDIOVERSION;  Surgeon: Lelon Perla, MD;  Location: [facility]COPY;  Service: Cardiovascular;  Laterality: N/A;   CATARACT EXTRACTION     COLONOSCOPY W/ POLYPECTOMY     ELECTROPHYSIOLOGIC STUDY N/A 07/30/2015   Procedure: Atrial Fibrillation Ablation;  Surgeon: Thompson Grayer, MD;  Location: [facility];  Service: Cardiovascular;  Laterality: N/A;   EXTRACORPOREAL SHOCK WAVE LITHOTRIPSY Right 10/29/2016   Procedure: RIGHT EXTRACORPOREAL SHOCK WAVE LITHOTRIPSY (ESWL);  Surgeon: Alexis Frock, MD;  Location: [facility];  Service: Urology;  Laterality: Right;   EYE SURGERY Right    cataract   FEMUR FRACTURE SURGERY     HERNIA REPAIR  07/06/2011   INGUINAL HERNIA REPAIR Right 07/28/2013   Procedure: RIGHT INGUINAL HERNIA REPAIR;  Surgeon: Imogene Burn. Georgette Dover, MD;  Location: [facility];  Service: General;  Laterality: Right;   INGUINAL HERNIA REPAIR Right 09/22/2016   Procedure: LAPAROSCOPIC RIGHT INGUINAL HERNIA;  Surgeon: Kieth Brightly Arta Bruce, MD;  Location: [facility];  Service: General;  Laterality: Right;  With MESH   INSERTION OF MESH Right 07/28/2013   Procedure: INSERTION OF MESH;  Surgeon: Imogene Burn. Georgette Dover, MD;  Location: [facility];  Service: General;  Laterality: Right;   KNEE ARTHROSCOPY     left   REPLACEMENT TOTAL KNEE Left    ROTATOR CUFF REPAIR     right   ROTATOR  CUFF REPAIR Left 03/08/2014   DR SUPPLE   SHOULDER ARTHROSCOPY WITH ROTATOR CUFF REPAIR AND SUBACROMIAL DECOMPRESSION Left 03/08/2014   Procedure: LEFT SHOULDER ARTHROSCOPY WITH ROTATOR CUFF REPAIR/SUBACROMIAL DECOMPRESSION/DISTAL CLAVICLE RESECTION;  Surgeon: Marin Shutter, MD;  Location: [facility];  Service: Orthopedics;  Laterality: Left;   SVT ABLATION N/A 01/23/2019   Procedure: SVT ABLATION;  Surgeon: Evans Lance, MD;  Location: [facility];  Service: Cardiovascular;  Laterality: N/A;   TEE WITHOUT CARDIOVERSION N/A 03/21/2012   Procedure: TRANSESOPHAGEAL ECHOCARDIOGRAM (TEE);  Surgeon: Birdie Riddle, MD;  Location: [facility];  Service: Cardiovascular;  Laterality: N/A;   TEE WITHOUT CARDIOVERSION N/A 04/24/2013   Procedure: TRANSESOPHAGEAL ECHOCARDIOGRAM (TEE);  Surgeon: Dorothy Spark, MD;  Location: [facility];  Service: Cardiovascular;  Laterality: N/A;   TEE WITHOUT CARDIOVERSION N/A 07/29/2015  Procedure: TRANSESOPHAGEAL ECHOCARDIOGRAM (TEE);  Surgeon: Fay Records, MD;  Location: [facility];  Service: Cardiovascular;  Laterality: N/A;   TONSILLECTOMY     TOTAL KNEE ARTHROPLASTY Right 05/24/2018   Procedure: RIGHT TOTAL KNEE ARTHROPLASTY;  Surgeon: Paralee Cancel, MD;  Location: [facility];  Service: Orthopedics;  Laterality: Right;  70 mins     Allergies  Allergen Reactions   Ace Inhibitors Cough   Albuterol Other (See Comments)    Racing heart    Social History   Socioeconomic History   Marital status: Married    Spouse name: Not on file   Number of children: 2   Years of education: college   Highest education level: Not on file  Occupational History   Not on file  Tobacco Use   Smoking status: Former    Packs/day: 0.50    Years: 5.00    Additional pack years: 0.00    Total pack years: 2.50    Types: Cigarettes    Quit date: 40    Years since quitting: 48.2   Smokeless tobacco: Never   Tobacco comments:    Former smoker 03/27/22  Vaping Use    Vaping Use: Never used  Substance and Sexual Activity   Alcohol use: No    Comment: Former EtOH abuse, stopped 06/2016   Drug use: No    Comment: negative hx for IV drug abuse   Sexual activity: Not on file  Other Topics Concern   Not on file  Social History Narrative   Lives with wife in [place]   Retired Dietitian   Wife lives in town (still married but lives separate)   Has biological daughter in [place] ([place])   Social Determinants of Health   Financial Resource Strain: [place]  (12/10/2021)   Overall Financial Resource Strain (CARDIA)    Difficulty of Paying Living Expenses: Not hard at all  Food Insecurity: No [place] (12/10/2021)   Hunger Vital Sign    Worried About Running Out of Food in the Last Year: Never true    [place] in the Last Year: Never true  Transportation Needs: No Transportation Needs (12/10/2021)   PRAPARE - Hydrologist (Medical): No    Lack of Transportation (Non-Medical): No  Physical Activity: Inactive (12/10/2021)   Exercise Vital Sign    Days of Exercise per Week: 0 days    Minutes of Exercise per Session: 0 min  Stress: No Stress Concern Present (12/10/2021)   [place]    Feeling of Stress : Not at all  Social Connections: Moderately Integrated (12/10/2021)   Social Connection and Isolation Panel [NHANES]    Frequency of Communication with Friends and Family: More than three times a week    Frequency of Social Gatherings with Friends and Family: Once a week    Attends Religious Services: 1 to 4 times per year    Active Member of [employer] or Organizations: Yes    Attends Archivist Meetings: 1 to 4 times per year    Marital Status: Separated  Intimate Partner Violence: Not At Risk (12/10/2021)   Humiliation, Afraid, Rape, and Kick questionnaire    Fear of Current or Ex-Partner: No    Emotionally Abused: No     Physically Abused: No    Sexually Abused: No    Family History  Problem Relation Age of Onset   Cancer  Father        bone   Heart failure Mother    Hypertension Mother    Asthma Mother    Cancer Paternal Grandmother        ovarian   CVA Other        Fam Hx of multiple myeloma   Diabetes Other        Fam Hx of DM    ROS- All systems are reviewed and negative except as per the HPI above  Physical Exam: Vitals:   03/27/22 1026  BP: (!) 138/90  Pulse: 69  Weight: 125.9 kg  Height: 6\' 4"  (1.93 m)     GEN- The patient is a well appearing male, alert and oriented x 3 today.   HEENT-head normocephalic, atraumatic, sclera clear, conjunctiva pink, hearing intact, trachea midline. Lungs- Clear to ausculation bilaterally, normal work of breathing Heart- Regular rate and rhythm, no murmurs, rubs or gallops  GI- soft, NT, ND, + BS Extremities- no clubbing, cyanosis, or edema MS- no significant deformity or atrophy Skin- no rash or lesion Psych- euthymic mood, full affect Neuro- strength and sensation are intact   EKG today demonstrates SR Vent. rate 69 BPM PR interval 208 ms QRS duration 104 ms QT/QTcB 398/426 ms   CHA2DS2-VASc Score = 2  The patient's score is based upon: CHF History: 0 HTN History: 1 Diabetes History: 0 Stroke History: 0 Vascular Disease History: 0 Age Score: 1 Gender Score: 0       ASSESSMENT AND PLAN: 1. Persistent Atrial Fibrillation (ICD10:  I48.19) The patient's CHA2DS2-VASc score is 2, indicating a 2.2% annual risk of stroke.   S/p ablation 2017 and 02/27/22 Patient appears to be maintaining SR.  Continue Eliquis 5 mg BID with no missed doses for 3 months post ablation. Continue carvedilol 25 mg BID  2. Secondary Hypercoagulable State (ICD10:  D68.69) The patient is at significant risk for stroke/thromboembolism based upon his CHA2DS2-VASc Score of 2.  Continue Apixaban (Eliquis).   3. HTN Stable, no changes today.  4. SVT AVNRT  s/p ablation.   Follow up with Dr Myles Gip as scheduled.    [facility] [address] [place], [place] [address] [phone]

## 2022-04-01 DIAGNOSIS — M545 Low back pain, unspecified: Secondary | ICD-10-CM | POA: Diagnosis not present

## 2022-04-01 DIAGNOSIS — M4316 Spondylolisthesis, lumbar region: Secondary | ICD-10-CM | POA: Diagnosis not present

## 2022-04-01 DIAGNOSIS — M419 Scoliosis, unspecified: Secondary | ICD-10-CM | POA: Diagnosis not present

## 2022-04-01 DIAGNOSIS — M48061 Spinal stenosis, lumbar region without neurogenic claudication: Secondary | ICD-10-CM | POA: Diagnosis not present

## 2022-04-01 DIAGNOSIS — R2689 Other abnormalities of gait and mobility: Secondary | ICD-10-CM | POA: Diagnosis not present

## 2022-04-01 DIAGNOSIS — M160 Bilateral primary osteoarthritis of hip: Secondary | ICD-10-CM | POA: Diagnosis not present

## 2022-04-01 DIAGNOSIS — M5416 Radiculopathy, lumbar region: Secondary | ICD-10-CM | POA: Diagnosis not present

## 2022-05-07 ENCOUNTER — Ambulatory Visit (HOSPITAL_COMMUNITY)
Admission: RE | Admit: 2022-05-07 | Discharge: 2022-05-07 | Disposition: A | Discharge status: Home / Self Care | Payer: Medicare Other | Source: Ambulatory Visit | Attending: Physician Assistant | Admitting: Physician Assistant

## 2022-05-07 VITALS — BP 118/88 | HR 95 | Ht 76.0 in | Wt 283.6 lb

## 2022-05-07 DIAGNOSIS — I10 Essential (primary) hypertension: Secondary | ICD-10-CM | POA: Diagnosis not present

## 2022-05-07 DIAGNOSIS — D6869 Other thrombophilia: Secondary | ICD-10-CM | POA: Insufficient documentation

## 2022-05-07 DIAGNOSIS — I471 Supraventricular tachycardia, unspecified: Secondary | ICD-10-CM | POA: Diagnosis not present

## 2022-05-07 DIAGNOSIS — Z7901 Long term (current) use of anticoagulants: Secondary | ICD-10-CM | POA: Diagnosis not present

## 2022-05-07 DIAGNOSIS — I4719 Other supraventricular tachycardia: Secondary | ICD-10-CM | POA: Insufficient documentation

## 2022-05-07 DIAGNOSIS — I4819 Other persistent atrial fibrillation: Secondary | ICD-10-CM | POA: Diagnosis not present

## 2022-05-07 LAB — BASIC METABOLIC PANEL
Anion gap: 10 (ref 5–15)
BUN: 16 mg/dL (ref 8–23)
CO2: 24 mmol/L (ref 22–32)
Calcium: 8.9 mg/dL (ref 8.9–10.3)
Chloride: 104 mmol/L (ref 98–111)
Creatinine, Ser: 0.89 mg/dL (ref 0.61–1.24)
GFR, Estimated: >60 mL/min (ref >60)
Glucose, Bld: 91 mg/dL (ref 70–99)
Potassium: 3.7 mmol/L (ref 3.5–5.1)
Sodium: 138 mmol/L (ref 135–145)

## 2022-05-07 LAB — CBC
HCT: 42.1 % (ref 39.0–52.0)
Hemoglobin: 13.8 g/dL (ref 13.0–17.0)
MCH: 30.2 pg (ref 26.0–34.0)
MCHC: 32.8 g/dL (ref 30.0–36.0)
MCV: 92.1 fL (ref 80.0–100.0)
Platelets: 173 10*3/uL (ref 150–400)
RBC: 4.57 MIL/uL (ref 4.22–5.81)
RDW: 13.5 % (ref 11.5–15.5)
WBC: 7.1 10*3/uL (ref 4.0–10.5)
nRBC: 0 % (ref 0.0–0.2)

## 2022-05-07 NOTE — H&P (View-Only) (Signed)
   Primary Care Physician: Jones, Thomas L, MD Referring Physician: Dr. Allred Primary EP: Dr Taylor    Brandon B Waldroup Jr. is a 72 y.o. male with a h/o afib, s/p ablation, maintaining SR, off amiodarone. He had one episode of afib in July, none since then He has gained 20 lbs since Covid inactivity. He had rt knee replacement in May.  He recently had 2 separet ER visits for SVT with RVR and very hypotensive. The first time in ER, he was given adenosine and broke but with return of SVT, he was cardioverted. He went home only to have  the same scenario to repeat again the next day. This time he was kept and treated. These episodes  may have been set off by pt not having his coreg 25 mg bid for a week as he was waiting for refills. He was given amiodarone IV but when he returned to SR, this was stopped and he was gotten back on his BB. Pt states that Dr. Taylor wanted to see him after discharge  to  discuss SVT ablation. CHA2DS2VASc of 2, on eliquis.  F/u afib clinic, 06/14/19. He had an SVT ablation that was successful and he has not had any further tachyarrhythmia's. Overall he reports that he id soing well. Undergoing PT in after math of rt knee replacement. No issues with his anticoagulation.   Follow up in the AF clinic 03/27/22. Patient was seen in follow up by Renee Ursuy on 10/21/21 and found to be in persistent afib. He underwent DCCV on 11/11/21 which was unsuccessful after 3 shocks. He was seen by Dr Mealor and underwent repeat ablation on 02/27/22. He reports that he has done very well since the ablation with no afib episodes. He denies chest pain, swallowing pain, or groin issues.   Follow up in the AF clinic 05/07/22. Patient reports that for the past week+. He is currently in the process of moving and noted that he couldn't pack more than 2-3 boxes without getting very fatigued. He checked his Kardia mobile which showed rate controlled afib. There were no specific triggers that he could identify.  He did miss a dose of Eliquis on 4/30.  Today, he denies symptoms of palpitations, chest pain, orthopnea, PND, lower extremity edema, dizziness, presyncope, syncope, or neurologic sequela. The patient is tolerating medications without difficulties and is otherwise without complaint today.   Past Medical History:  Diagnosis Date   Anxiety    Arthritis    Benign neoplasm of colon    Benign prostatic hyperplasia with urinary obstruction    Bilateral inguinal hernia 05/29/2011   Bilateral pulmonary embolism (HCC) 3/14   admitted to [place],  treated with Xarelto   Breast lump 03/29/2019   Calculus of kidney 10/21/2016   Cataract    Chronic bilateral low back pain with sciatica 03/21/2012   Chronic pain    Finger mass, right 10/17/2020   History of alcohol abuse 03/07/2016   History of colon polyps 11/18/2015   History of kidney stones    History of pulmonary embolism 03/19/2012   Hyperlipemia    Hypertension    Insomnia    Insomnia due to medical condition 12/23/2016   Polyuria secondary to BPH   Major depressive disorder, recurrent episode (HCC)    Mitral regurgitation 04/24/2013   Mild by TEE   Morbid obesity (HCC) 03/07/2016   Nonrheumatic mitral valve insufficiency 04/25/2013   Mild by TEE  Overview:  Overview:  Mild by TEE   Overview:  Overview:  Overview:  Mild by TEE Overview:  Overview:  Mild by TEE   Obesity    OSA (obstructive sleep apnea)    noncompliant with CPAP.  07/27/13- awaiting a CPAP- unable to tolerate mask   Osteoporosis    Persistent atrial fibrillation (HCC)    Recurrent unilateral inguinal hernia 07/17/2013   Restless leg syndrome    takes gabapentin   S/P right total knee arthroplasty 05/24/2018   Sciatica    Spinal stenosis, lumbar region with neurogenic claudication    SVT (supraventricular tachycardia) 03/07/2016   Thrombus of left atrial appendage 03/09/2013   Uncomplicated asthma 06/20/2008   Overview:  Overview:  Overview:  Overview:  Qualifier: Diagnosis  of  By: Todd MD, Jeffrey A Overview:  Overview:  Qualifier: Diagnosis of  By: Todd MD, Jeffrey A   Ventral hernia, unspecified, without mention of obstruction or gangrene    right abdominal wall   Past Surgical History:  Procedure Laterality Date   ATRIAL FIBRILLATION ABLATION N/A 02/27/2022   Procedure: ATRIAL FIBRILLATION ABLATION;  Surgeon: Mealor, Augustus E, MD;  [facility]: [facility];  Service: Cardiovascular;  Laterality: N/A;   CARDIOVERSION N/A 03/21/2012   Procedure: CARDIOVERSION;  Surgeon: Ajay S Kadakia, MD;  Location: [facility];  Service: Cardiovascular;  Laterality: N/A;   CARDIOVERSION N/A 04/24/2013   Procedure: CARDIOVERSION;  Surgeon: Katarina H Nelson, MD;  Location: [facility];  Service: Cardiovascular;  Laterality: N/A;   CARDIOVERSION N/A 06/27/2015   Procedure: CARDIOVERSION;  Surgeon: Dalton S McLean, MD;  [facility]: [facility];  Service: Cardiovascular;  Laterality: N/A;   CARDIOVERSION N/A 11/11/2021   Procedure: CARDIOVERSION;  Surgeon: Crenshaw, Brian S, MD;  Location: [facility];  Service: Cardiovascular;  Laterality: N/A;   CATARACT EXTRACTION     COLONOSCOPY W/ POLYPECTOMY     ELECTROPHYSIOLOGIC STUDY N/A 07/30/2015   Procedure: Atrial Fibrillation Ablation;  Surgeon: James Allred, MD;  [facility]: [facility];  Service: Cardiovascular;  Laterality: N/A;   EXTRACORPOREAL SHOCK WAVE LITHOTRIPSY Right 10/29/2016   Procedure: RIGHT EXTRACORPOREAL SHOCK WAVE LITHOTRIPSY (ESWL);  Surgeon: Manny, Theodore, MD;  Location: [facility];  Service: Urology;  Laterality: Right;   EYE SURGERY Right    cataract   FEMUR FRACTURE SURGERY     HERNIA REPAIR  07/06/2011   INGUINAL HERNIA REPAIR Right 07/28/2013   Procedure: RIGHT INGUINAL HERNIA REPAIR;  Surgeon: Matthew K. Tsuei, MD;  Location: [facility];  Service: General;  Laterality: Right;   INGUINAL HERNIA REPAIR Right 09/22/2016   Procedure: LAPAROSCOPIC RIGHT INGUINAL HERNIA;  Surgeon: Kinsinger, Luke Aaron, MD;   Location: [facility];  Service: General;  Laterality: Right;  With MESH   INSERTION OF MESH Right 07/28/2013   Procedure: INSERTION OF MESH;  Surgeon: Matthew K. Tsuei, MD;  Location: [facility];  Service: General;  Laterality: Right;   KNEE ARTHROSCOPY     left   REPLACEMENT TOTAL KNEE Left    ROTATOR CUFF REPAIR     right   ROTATOR CUFF REPAIR Left 03/08/2014   DR SUPPLE   SHOULDER ARTHROSCOPY WITH ROTATOR CUFF REPAIR AND SUBACROMIAL DECOMPRESSION Left 03/08/2014   Procedure: LEFT SHOULDER ARTHROSCOPY WITH ROTATOR CUFF REPAIR/SUBACROMIAL DECOMPRESSION/DISTAL CLAVICLE RESECTION;  Surgeon: Kevin M Supple, MD;  Location: [facility];  Service: Orthopedics;  Laterality: Left;   SVT ABLATION N/A 01/23/2019   Procedure: SVT ABLATION;  Surgeon: Taylor, Gregg W, MD;  [facility];  Service: Cardiovascular;  Laterality: N/A;   TEE   WITHOUT CARDIOVERSION N/A 03/21/2012   Procedure: TRANSESOPHAGEAL ECHOCARDIOGRAM (TEE);  Surgeon: Ajay S Kadakia, MD;  [facility];  Service: Cardiovascular;  Laterality: N/A;   TEE WITHOUT CARDIOVERSION N/A 04/24/2013   Procedure: TRANSESOPHAGEAL ECHOCARDIOGRAM (TEE);  Surgeon: Katarina H Nelson, MD;  [facility];  Service: Cardiovascular;  Laterality: N/A;   TEE WITHOUT CARDIOVERSION N/A 07/29/2015   Procedure: TRANSESOPHAGEAL ECHOCARDIOGRAM (TEE);  Surgeon: Paula V Ross, MD;  [facility];  Service: Cardiovascular;  Laterality: N/A;   TONSILLECTOMY     TOTAL KNEE ARTHROPLASTY Right 05/24/2018   Procedure: RIGHT TOTAL KNEE ARTHROPLASTY;  Surgeon: Olin, Matthew, MD;  Location: [facility];  Service: Orthopedics;  Laterality: Right;  70 mins     Allergies  Allergen Reactions   Ace Inhibitors Cough   Albuterol Other (See Comments)    Racing heart    Social History   Socioeconomic History   Marital status: Married    Spouse name: Not on file   Number of children: 2   Years of education: college   Highest education level: Not on file   Occupational History   Not on file  Tobacco Use   Smoking status: Former    Packs/day: 0.50    Years: 5.00    Additional pack years: 0.00    Total pack years: 2.50    Types: Cigarettes    Quit date: 1976    Years since quitting: 48.3   Smokeless tobacco: Never   Tobacco comments:    Former smoker 03/27/22  Vaping Use   Vaping Use: Never used  Substance and Sexual Activity   Alcohol use: No    Comment: Former EtOH abuse, stopped 06/2016   Drug use: No    Comment: negative hx for IV drug abuse   Sexual activity: Not on file  Other Topics Concern   Not on file  Social History Narrative   Lives with wife in [place]   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   Prior Military   Wife lives in town (still married but lives separate)   Has biological daughter in [place] (Mary Castellana)   Social Determinants of Health   Financial Resource Strain: Low Risk  (12/10/2021)   Overall Financial Resource Strain (CARDIA)    Difficulty of Paying Living Expenses: Not hard at all  Food Insecurity: No Food Insecurity (12/10/2021)   Hunger Vital Sign    Worried About Running Out of Food in the Last Year: Never true    Ran Out of Food in the Last Year: Never true  Transportation Needs: No Transportation Needs (12/10/2021)   PRAPARE - Transportation    Lack of Transportation (Medical): No    Lack of Transportation (Non-Medical): No  Physical Activity: Inactive (12/10/2021)   Exercise Vital Sign    Days of Exercise per Week: 0 days    Minutes of Exercise per Session: 0 min  Stress: No Stress Concern Present (12/10/2021)   [employer] - Occupational Stress Questionnaire    Feeling of Stress : Not at all  Social Connections: Moderately Integrated (12/10/2021)   Social Connection and Isolation Panel [NHANES]    Frequency of Communication with Friends and Family: More than three times a week    Frequency of Social Gatherings with Friends and Family: Once a week    Attends Religious Services: 1 to  4 times per year    Active Member of Clubs or Organizations: Yes    Attends Club or Organization Meetings: 1 to 4 times per   year    Marital Status: Separated  Intimate Partner Violence: Not At Risk (12/10/2021)   Humiliation, Afraid, Rape, and Kick questionnaire    Fear of Current or Ex-Partner: No    Emotionally Abused: No    Physically Abused: No    Sexually Abused: No    Family History  Problem Relation Age of Onset   Cancer Father        bone   Heart failure Mother    Hypertension Mother    Asthma Mother    Cancer Paternal Grandmother        ovarian   CVA Other        Fam Hx of multiple myeloma   Diabetes Other        Fam Hx of DM    ROS- All systems are reviewed and negative except as per the HPI above  Physical Exam: Vitals:   05/07/22 0855  BP: 118/88  Pulse: 95  Weight: 128.6 kg  Height: 6' 4" (1.93 m)    Filed Weights   05/07/22 0855  Weight: 128.6 kg     GEN- The patient is a well appearing male, alert and oriented x 3 today.   HEENT-head normocephalic, atraumatic, sclera clear, conjunctiva pink, hearing intact, trachea midline. Lungs- Clear to ausculation bilaterally, normal work of breathing Heart- irregular rate and rhythm, no murmurs, rubs or gallops  GI- soft, NT, ND, + BS Extremities- no clubbing, cyanosis, or edema MS- no significant deformity or atrophy Skin- no rash or lesion Psych- euthymic mood, full affect Neuro- strength and sensation are intact    EKG today demonstrates Afib Vent. rate 74 BPM PR interval * ms QRS duration 104 ms QT/QTcB 380/421 ms  Echo 12/08/21  1. Left ventricular ejection fraction, by estimation, is 55 to 60%. The  left ventricle has normal function. The left ventricle has no regional  wall motion abnormalities. There is mild asymmetric left ventricular  hypertrophy of the basal-septal segment. Diastolic function indeterminant due to AFib.   2. Right ventricular systolic function is normal. The right  ventricular  size is normal. Tricuspid regurgitation signal is inadequate for assessing  PA pressure.   3. Left atrial size was severely dilated.   4. Right atrial size was mildly dilated.   5. The mitral valve is normal in structure. Mild mitral valve  regurgitation.   6. The aortic valve is tricuspid. There is mild calcification of the  aortic valve. There is mild thickening of the aortic valve. Aortic valve  regurgitation is trivial. Aortic valve sclerosis/calcification is present,  without any evidence of aortic stenosis.   7. Aortic dilatation noted. There is mild dilatation of the aortic root,  measuring 41 mm. There is moderate dilatation of the ascending aorta,  measuring 47 mm. Recommend CTA for further evaluation of aorta.   8. The inferior vena cava is normal in size with greater than 50%  respiratory variability, suggesting right atrial pressure of 3 mmHg.   Comparison(s): Compared to prior TTE in 2018, patient is now in Afib and  the ascending aorta is moderately diltated (previously measured at 43mm  per my review of old study). Recommend CTA to further evaluate aorta.     CHA2DS2-VASc Score = 2  The patient's score is based upon: CHF History: 0 HTN History: 1 Diabetes History: 0 Stroke History: 0 Vascular Disease History: 0 Age Score: 1 Gender Score: 0       ASSESSMENT AND PLAN: 1. Persistent Atrial Fibrillation (ICD10:  I48.19)   The patient's CHA2DS2-VASc score is 2, indicating a 2.2% annual risk of stroke.   S/p ablation 2017 and 02/27/22 Patient back in rate controlled symptomatic afib. We discussed rhythm control options. Will plan for DCCV. He did miss a dose of Eliquis on 4/30. He does not feel that he can wait 3 full weeks before DCCV. Will also arrange for TEE.  Continue Eliquis 5 mg BID Continue carvedilol 25 mg BID  2. Secondary Hypercoagulable State (ICD10:  D68.69) The patient is at significant risk for stroke/thromboembolism based upon his  CHA2DS2-VASc Score of 2.  Continue Apixaban (Eliquis).   3. HTN Stable, no changes today.  4. SVT AVNRT s/p ablation.   Follow up with Dr Mealor as scheduled.    Ricky Edessa Jakubowicz PA-C [facility] [address] ,  [address] [phone] 

## 2022-05-07 NOTE — Patient Instructions (Signed)
Cardioversion scheduled for: Monday, May 6th   - Arrive at the [facility] and go to admitting at 10am   - Do not eat or drink anything after midnight the night prior to your procedure.   - Take all your morning medication (except diabetic medications) with a sip of water prior to arrival.  - You will not be able to drive home after your procedure.    - Do NOT miss any doses of your blood thinner - if you should miss a dose please notify our office immediately.   - If you feel as if you go back into normal rhythm prior to scheduled cardioversion, please notify our office immediately.   If your procedure is canceled in the cardioversion suite you will be charged a cancellation fee.

## 2022-05-07 NOTE — Progress Notes (Signed)
Primary Care Physician: Etta Grandchild, MD Referring Physician: Dr. Johney Frame Primary EP: Dr Brock Ra Romanoff Montez Hageman. is a 72 y.o. male with a h/o afib, s/p ablation, maintaining SR, off amiodarone. He had one episode of afib in July, none since then He has gained 20 lbs since Covid inactivity. He had rt knee replacement in May.  He recently had 2 separet ER visits for SVT with RVR and very hypotensive. The first time in ER, he was given adenosine and broke but with return of SVT, he was cardioverted. He went home only to have  the same scenario to repeat again the next day. This time he was kept and treated. These episodes  may have been set off by pt not having his coreg 25 mg bid for a week as he was waiting for refills. He was given amiodarone IV but when he returned to SR, this was stopped and he was gotten back on his BB. Pt states that Dr. Ladona Ridgel wanted to see him after discharge  to  discuss SVT ablation. CHA2DS2VASc of 2, on eliquis.  F/u afib clinic, 06/14/19. He had an SVT ablation that was successful and he has not had any further tachyarrhythmia's. Overall he reports that he id soing well. Undergoing PT in after math of rt knee replacement. No issues with his anticoagulation.   Follow up in the AF clinic 03/27/22. Patient was seen in follow up by Francis Dowse on 10/21/21 and found to be in persistent afib. He underwent DCCV on 11/11/21 which was unsuccessful after 3 shocks. He was seen by Dr Nelly Laurence and underwent repeat ablation on 02/27/22. He reports that he has done very well since the ablation with no afib episodes. He denies chest pain, swallowing pain, or groin issues.   Follow up in the AF clinic 05/07/22. Patient reports that for the past week+. He is currently in the process of moving and noted that he couldn't pack more than 2-3 boxes without getting very fatigued. He checked his Kardia mobile which showed rate controlled afib. There were no specific triggers that he could identify.  He did miss a dose of Eliquis on 4/30.  Today, he denies symptoms of palpitations, chest pain, orthopnea, PND, lower extremity edema, dizziness, presyncope, syncope, or neurologic sequela. The patient is tolerating medications without difficulties and is otherwise without complaint today.   Past Medical History:  Diagnosis Date   Anxiety    Arthritis    Benign neoplasm of colon    Benign prostatic hyperplasia with urinary obstruction    Bilateral inguinal hernia 05/29/2011   Bilateral pulmonary embolism (HCC) 3/14   admitted to [facility],  treated with Xarelto   Breast lump 03/29/2019   Calculus of kidney 10/21/2016   Cataract    Chronic bilateral low back pain with sciatica 03/21/2012   Chronic pain    Finger mass, right 10/17/2020   History of alcohol abuse 03/07/2016   History of colon polyps 11/18/2015   History of kidney stones    History of pulmonary embolism 03/19/2012   Hyperlipemia    Hypertension    Insomnia    Insomnia due to medical condition 12/23/2016   Polyuria secondary to BPH   Major depressive disorder, recurrent episode (HCC)    Mitral regurgitation 04/24/2013   Mild by TEE   Morbid obesity (HCC) 03/07/2016   Nonrheumatic mitral valve insufficiency 04/25/2013   Mild by TEE  Overview:  Overview:  Mild by TEE  Overview:  Overview:  Overview:  Mild by TEE Overview:  Overview:  Mild by TEE   Obesity    OSA (obstructive sleep apnea)    noncompliant with CPAP.  07/27/13- awaiting a CPAP- unable to tolerate mask   Osteoporosis    Persistent atrial fibrillation (HCC)    Recurrent unilateral inguinal hernia 07/17/2013   Restless leg syndrome    takes gabapentin   S/P right total knee arthroplasty 05/24/2018   Sciatica    Spinal stenosis, lumbar region with neurogenic claudication    SVT (supraventricular tachycardia) 03/07/2016   Thrombus of left atrial appendage 03/09/2013   Uncomplicated asthma 06/20/2008   Overview:  Overview:  Overview:  Overview:  Qualifier: Diagnosis  of  By: Tawanna Cooler MD, Eugenio Hoes Overview:  Overview:  Qualifier: Diagnosis of  By: Tawanna Cooler MD, Eugenio Hoes   Ventral hernia, unspecified, without mention of obstruction or gangrene    right abdominal wall   Past Surgical History:  Procedure Laterality Date   ATRIAL FIBRILLATION ABLATION N/A 02/27/2022   Procedure: ATRIAL FIBRILLATION ABLATION;  Surgeon: Maurice Small, MD;  Location: [facility] INVASIVE CV LAB;  Service: Cardiovascular;  Laterality: N/A;   CARDIOVERSION N/A 03/21/2012   Procedure: CARDIOVERSION;  Surgeon: Ricki Rodriguez, MD;  Location: [facility] ENDOSCOPY;  Service: Cardiovascular;  Laterality: N/A;   CARDIOVERSION N/A 04/24/2013   Procedure: CARDIOVERSION;  Surgeon: Lars Masson, MD;  Location: [facility] ENDOSCOPY;  Service: Cardiovascular;  Laterality: N/A;   CARDIOVERSION N/A 06/27/2015   Procedure: CARDIOVERSION;  Surgeon: Laurey Morale, MD;  Location: [facility] ENDOSCOPY;  Service: Cardiovascular;  Laterality: N/A;   CARDIOVERSION N/A 11/11/2021   Procedure: CARDIOVERSION;  Surgeon: Lewayne Bunting, MD;  Location: [facility] ENDOSCOPY;  Service: Cardiovascular;  Laterality: N/A;   CATARACT EXTRACTION     COLONOSCOPY W/ POLYPECTOMY     ELECTROPHYSIOLOGIC STUDY N/A 07/30/2015   Procedure: Atrial Fibrillation Ablation;  Surgeon: Hillis Range, MD;  Location: [facility] INVASIVE CV LAB;  Service: Cardiovascular;  Laterality: N/A;   EXTRACORPOREAL SHOCK WAVE LITHOTRIPSY Right 10/29/2016   Procedure: RIGHT EXTRACORPOREAL SHOCK WAVE LITHOTRIPSY (ESWL);  Surgeon: Sebastian Ache, MD;  Location: [facility] ORS;  Service: Urology;  Laterality: Right;   EYE SURGERY Right    cataract   FEMUR FRACTURE SURGERY     HERNIA REPAIR  07/06/2011   INGUINAL HERNIA REPAIR Right 07/28/2013   Procedure: RIGHT INGUINAL HERNIA REPAIR;  Surgeon: Wilmon Arms. Corliss Skains, MD;  Location: [facility] OR;  Service: General;  Laterality: Right;   INGUINAL HERNIA REPAIR Right 09/22/2016   Procedure: LAPAROSCOPIC RIGHT INGUINAL HERNIA;  Surgeon: Sheliah Hatch De Blanch, MD;   Location: [facility] ORS;  Service: General;  Laterality: Right;  With MESH   INSERTION OF MESH Right 07/28/2013   Procedure: INSERTION OF MESH;  Surgeon: Wilmon Arms. Corliss Skains, MD;  Location: [facility] OR;  Service: General;  Laterality: Right;   KNEE ARTHROSCOPY     left   REPLACEMENT TOTAL KNEE Left    ROTATOR CUFF REPAIR     right   ROTATOR CUFF REPAIR Left 03/08/2014   DR SUPPLE   SHOULDER ARTHROSCOPY WITH ROTATOR CUFF REPAIR AND SUBACROMIAL DECOMPRESSION Left 03/08/2014   Procedure: LEFT SHOULDER ARTHROSCOPY WITH ROTATOR CUFF REPAIR/SUBACROMIAL DECOMPRESSION/DISTAL CLAVICLE RESECTION;  Surgeon: Senaida Lange, MD;  Location: [facility] OR;  Service: Orthopedics;  Laterality: Left;   SVT ABLATION N/A 01/23/2019   Procedure: SVT ABLATION;  Surgeon: Marinus Maw, MD;  Location: [facility] INVASIVE CV LAB;  Service: Cardiovascular;  Laterality: N/A;   TEE  WITHOUT CARDIOVERSION N/A 03/21/2012   Procedure: TRANSESOPHAGEAL ECHOCARDIOGRAM (TEE);  Surgeon: Ricki Rodriguez, MD;  Location: [facility] ENDOSCOPY;  Service: Cardiovascular;  Laterality: N/A;   TEE WITHOUT CARDIOVERSION N/A 04/24/2013   Procedure: TRANSESOPHAGEAL ECHOCARDIOGRAM (TEE);  Surgeon: Lars Masson, MD;  Location: [facility] ENDOSCOPY;  Service: Cardiovascular;  Laterality: N/A;   TEE WITHOUT CARDIOVERSION N/A 07/29/2015   Procedure: TRANSESOPHAGEAL ECHOCARDIOGRAM (TEE);  Surgeon: Pricilla Riffle, MD;  Location: [facility] ENDOSCOPY;  Service: Cardiovascular;  Laterality: N/A;   TONSILLECTOMY     TOTAL KNEE ARTHROPLASTY Right 05/24/2018   Procedure: RIGHT TOTAL KNEE ARTHROPLASTY;  Surgeon: Durene Romans, MD;  Location: [facility] ORS;  Service: Orthopedics;  Laterality: Right;  70 mins     Allergies  Allergen Reactions   Ace Inhibitors Cough   Albuterol Other (See Comments)    Racing heart    Social History   Socioeconomic History   Marital status: Married    Spouse name: Not on file   Number of children: 2   Years of education: college   Highest education level: Not on file   Occupational History   Not on file  Tobacco Use   Smoking status: Former    Packs/day: 0.50    Years: 5.00    Additional pack years: 0.00    Total pack years: 2.50    Types: Cigarettes    Quit date: 65    Years since quitting: 48.3   Smokeless tobacco: Never   Tobacco comments:    Former smoker 03/27/22  Vaping Use   Vaping Use: Never used  Substance and Sexual Activity   Alcohol use: No    Comment: Former EtOH abuse, stopped 06/2016   Drug use: No    Comment: negative hx for IV drug abuse   Sexual activity: Not on file  Other Topics Concern   Not on file  Social History Narrative   Lives with wife in [place]   Retired Investment banker, operational   Wife lives in town (still married but lives separate)   Has biological daughter in [place] ([place])   Social Determinants of Health   Financial Resource Strain: Low Risk  (12/10/2021)   Overall Financial Resource Strain (CARDIA)    Difficulty of Paying Living Expenses: Not hard at all  Food Insecurity: No Food Insecurity (12/10/2021)   Hunger Vital Sign    Worried About Running Out of Food in the Last Year: Never true    Ran Out of Food in the Last Year: Never true  Transportation Needs: No Transportation Needs (12/10/2021)   PRAPARE - Administrator, Civil Service (Medical): No    Lack of Transportation (Non-Medical): No  Physical Activity: Inactive (12/10/2021)   Exercise Vital Sign    Days of Exercise per Week: 0 days    Minutes of Exercise per Session: 0 min  Stress: No Stress Concern Present (12/10/2021)   [employer] of Occupational Health - Occupational Stress Questionnaire    Feeling of Stress : Not at all  Social Connections: Moderately Integrated (12/10/2021)   Social Connection and Isolation Panel [NHANES]    Frequency of Communication with Friends and Family: More than three times a week    Frequency of Social Gatherings with Friends and Family: Once a week    Attends Religious Services: 1 to  4 times per year    Active Member of [employer] or Organizations: Yes    Attends Banker Meetings: 1 to 4 times per  year    Marital Status: Separated  Intimate Partner Violence: Not At Risk (12/10/2021)   Humiliation, Afraid, Rape, and Kick questionnaire    Fear of Current or Ex-Partner: No    Emotionally Abused: No    Physically Abused: No    Sexually Abused: No    Family History  Problem Relation Age of Onset   Cancer Father        bone   Heart failure Mother    Hypertension Mother    Asthma Mother    Cancer Paternal Grandmother        ovarian   CVA Other        Fam Hx of multiple myeloma   Diabetes Other        Fam Hx of DM    ROS- All systems are reviewed and negative except as per the HPI above  Physical Exam: Vitals:   05/07/22 0855  BP: 118/88  Pulse: 95  Weight: 128.6 kg  Height: 6\' 4"  (1.93 m)    Filed Weights   05/07/22 0855  Weight: 128.6 kg     GEN- The patient is a well appearing male, alert and oriented x 3 today.   HEENT-head normocephalic, atraumatic, sclera clear, conjunctiva pink, hearing intact, trachea midline. Lungs- Clear to ausculation bilaterally, normal work of breathing Heart- irregular rate and rhythm, no murmurs, rubs or gallops  GI- soft, NT, ND, + BS Extremities- no clubbing, cyanosis, or edema MS- no significant deformity or atrophy Skin- no rash or lesion Psych- euthymic mood, full affect Neuro- strength and sensation are intact    EKG today demonstrates Afib Vent. rate 74 BPM PR interval * ms QRS duration 104 ms QT/QTcB 380/421 ms  Echo 12/08/21  1. Left ventricular ejection fraction, by estimation, is 55 to 60%. The  left ventricle has normal function. The left ventricle has no regional  wall motion abnormalities. There is mild asymmetric left ventricular  hypertrophy of the basal-septal segment. Diastolic function indeterminant due to AFib.   2. Right ventricular systolic function is normal. The right  ventricular  size is normal. Tricuspid regurgitation signal is inadequate for assessing  PA pressure.   3. Left atrial size was severely dilated.   4. Right atrial size was mildly dilated.   5. The mitral valve is normal in structure. Mild mitral valve  regurgitation.   6. The aortic valve is tricuspid. There is mild calcification of the  aortic valve. There is mild thickening of the aortic valve. Aortic valve  regurgitation is trivial. Aortic valve sclerosis/calcification is present,  without any evidence of aortic stenosis.   7. Aortic dilatation noted. There is mild dilatation of the aortic root,  measuring 41 mm. There is moderate dilatation of the ascending aorta,  measuring 47 mm. Recommend CTA for further evaluation of aorta.   8. The inferior vena cava is normal in size with greater than 50%  respiratory variability, suggesting right atrial pressure of 3 mmHg.   Comparison(s): Compared to prior TTE in 2018, patient is now in Afib and  the ascending aorta is moderately diltated (previously measured at 43mm  per my review of old study). Recommend CTA to further evaluate aorta.     CHA2DS2-VASc Score = 2  The patient's score is based upon: CHF History: 0 HTN History: 1 Diabetes History: 0 Stroke History: 0 Vascular Disease History: 0 Age Score: 1 Gender Score: 0       ASSESSMENT AND PLAN: 1. Persistent Atrial Fibrillation (ICD10:  I48.19)  The patient's CHA2DS2-VASc score is 2, indicating a 2.2% annual risk of stroke.   S/p ablation 2017 and 02/27/22 Patient back in rate controlled symptomatic afib. We discussed rhythm control options. Will plan for DCCV. He did miss a dose of Eliquis on 4/30. He does not feel that he can wait 3 full weeks before DCCV. Will also arrange for TEE.  Continue Eliquis 5 mg BID Continue carvedilol 25 mg BID  2. Secondary Hypercoagulable State (ICD10:  D68.69) The patient is at significant risk for stroke/thromboembolism based upon his  CHA2DS2-VASc Score of 2.  Continue Apixaban (Eliquis).   3. HTN Stable, no changes today.  4. SVT AVNRT s/p ablation.   Follow up with Dr Nelly Laurence as scheduled.    Jorja Loa PA-C Afib Clinic [facility] [address] [place], [place] [address] [phone]

## 2022-05-08 NOTE — Pre-Procedure Instructions (Signed)
Left voicemail for pt regarding procedure (TEE/CV) on Monday - arrive at 1000, NPO after 0000 on sunday, ensure ride home and responsible person to stay with you for 24 hours after procedure, take meds in the AM with a sip of water

## 2022-05-11 ENCOUNTER — Other Ambulatory Visit: Payer: Self-pay

## 2022-05-11 ENCOUNTER — Encounter (HOSPITAL_COMMUNITY)
Admission: RE | Disposition: A | Discharge status: Home / Self Care | Payer: Self-pay | Source: Ambulatory Visit | Attending: Internal Medicine

## 2022-05-11 ENCOUNTER — Ambulatory Visit (HOSPITAL_BASED_OUTPATIENT_CLINIC_OR_DEPARTMENT_OTHER): Payer: Medicare Other | Admitting: Certified Registered"

## 2022-05-11 ENCOUNTER — Ambulatory Visit (HOSPITAL_COMMUNITY): Payer: Medicare Other | Admitting: Certified Registered"

## 2022-05-11 ENCOUNTER — Ambulatory Visit (HOSPITAL_COMMUNITY)
Admission: RE | Admit: 2022-05-11 | Discharge: 2022-05-11 | Disposition: A | Discharge status: Home / Self Care | Payer: Medicare Other | Source: Ambulatory Visit | Attending: Internal Medicine | Admitting: Internal Medicine

## 2022-05-11 ENCOUNTER — Ambulatory Visit (HOSPITAL_BASED_OUTPATIENT_CLINIC_OR_DEPARTMENT_OTHER)
Admission: RE | Admit: 2022-05-11 | Discharge: 2022-05-11 | Disposition: A | Discharge status: Home / Self Care | Payer: Medicare Other | Source: Ambulatory Visit | Attending: Physician Assistant | Admitting: Physician Assistant

## 2022-05-11 ENCOUNTER — Encounter (HOSPITAL_COMMUNITY): Payer: Self-pay | Admitting: Internal Medicine

## 2022-05-11 DIAGNOSIS — Z79899 Other long term (current) drug therapy: Secondary | ICD-10-CM | POA: Insufficient documentation

## 2022-05-11 DIAGNOSIS — D6869 Other thrombophilia: Secondary | ICD-10-CM | POA: Insufficient documentation

## 2022-05-11 DIAGNOSIS — I700 Atherosclerosis of aorta: Secondary | ICD-10-CM | POA: Diagnosis not present

## 2022-05-11 DIAGNOSIS — Z7901 Long term (current) use of anticoagulants: Secondary | ICD-10-CM | POA: Insufficient documentation

## 2022-05-11 DIAGNOSIS — Z87891 Personal history of nicotine dependence: Secondary | ICD-10-CM | POA: Insufficient documentation

## 2022-05-11 DIAGNOSIS — I119 Hypertensive heart disease without heart failure: Secondary | ICD-10-CM

## 2022-05-11 DIAGNOSIS — G4730 Sleep apnea, unspecified: Secondary | ICD-10-CM

## 2022-05-11 DIAGNOSIS — I4891 Unspecified atrial fibrillation: Secondary | ICD-10-CM

## 2022-05-11 DIAGNOSIS — I340 Nonrheumatic mitral (valve) insufficiency: Secondary | ICD-10-CM

## 2022-05-11 DIAGNOSIS — I471 Supraventricular tachycardia, unspecified: Secondary | ICD-10-CM | POA: Insufficient documentation

## 2022-05-11 DIAGNOSIS — I10 Essential (primary) hypertension: Secondary | ICD-10-CM | POA: Insufficient documentation

## 2022-05-11 DIAGNOSIS — G4733 Obstructive sleep apnea (adult) (pediatric): Secondary | ICD-10-CM | POA: Insufficient documentation

## 2022-05-11 DIAGNOSIS — I4819 Other persistent atrial fibrillation: Secondary | ICD-10-CM

## 2022-05-11 HISTORY — PX: CARDIOVERSION: SHX1299

## 2022-05-11 HISTORY — PX: TEE WITHOUT CARDIOVERSION: SHX5443

## 2022-05-11 LAB — ECHO TEE: Area-P 1/2: 5.2 cm2

## 2022-05-11 SURGERY — ECHOCARDIOGRAM, TRANSESOPHAGEAL
Anesthesia: General

## 2022-05-11 MED ORDER — SODIUM CHLORIDE 0.9 % IV SOLN: INTRAVENOUS | Status: DC

## 2022-05-11 MED ORDER — PROPOFOL 10 MG/ML IV BOLUS
INTRAVENOUS | Status: DC | PRN
  Administered 2022-05-11: 80 mg via INTRAVENOUS
  Administered 2022-05-11: 20 mg via INTRAVENOUS

## 2022-05-11 MED ORDER — PHENYLEPHRINE HCL (PRESSORS) 10 MG/ML IV SOLN
INTRAVENOUS | Status: DC | PRN
  Administered 2022-05-11: 80 ug via INTRAVENOUS

## 2022-05-11 MED ORDER — PROPOFOL 500 MG/50ML IV EMUL
INTRAVENOUS | Status: DC | PRN
  Administered 2022-05-11: 100 ug/kg/min via INTRAVENOUS

## 2022-05-11 MED ORDER — LIDOCAINE 2% (20 MG/ML) 5 ML SYRINGE
INTRAMUSCULAR | Status: DC | PRN
  Administered 2022-05-11: 80 mg via INTRAVENOUS

## 2022-05-11 SURGICAL SUPPLY — 1 items: ELECT DEFIB PAD ADLT CADENCE (PAD) ×1 IMPLANT

## 2022-05-11 NOTE — CV Procedure (Signed)
   TRANSESOPHAGEAL ECHOCARDIOGRAM GUIDED DIRECT CURRENT CARDIOVERSION  NAME:  Brandon Burton Hlavaty Jr.    MRN: [id number] DOB:  1950-03-23    ADMIT DATE: 05/11/2022  INDICATIONS: Symptomatic atrial fibrillation  PROCEDURE:   Informed consent was obtained prior to the procedure. The risks, benefits and alternatives for the procedure were discussed and the patient comprehended these risks.  Risks include, but are not limited to, cough, sore throat, vomiting, nausea, somnolence, esophageal and stomach trauma or perforation, bleeding, low blood pressure, aspiration, pneumonia, infection, trauma to the teeth and death.    After a procedural time-out, the oropharynx was anesthetized and the patient was sedated by the anesthesia service. The transesophageal probe was inserted in the esophagus and stomach without difficulty and multiple views were obtained. Anesthesia was monitored by Dr. Sandria Manly.   COMPLICATIONS:    Hypoxia post cardioversion.  During this time, returned to atrial fibrillation  KEY FINDINGS:  PFO. Full Report to follow.   CARDIOVERSION:     Indications:  Symptomatic atrial fibrillation  Procedure Details:  Once the TEE was complete, the patient had the defibrillator pads placed in the anterior and posterior position. Once an appropriate level of sedation was confirmed, the patient was cardioverted x 1 with 200J of biphasic synchronized energy.  The patient converted to sinus bradycardia  Patient had evidence of hypoxia.  Blood pressures was low.  Received appropriate interventions for this.  During these interventions, returned to atrial fibrillation.  Riley Lam, MD [place]  [facility]  11:44 AM

## 2022-05-11 NOTE — Transfer of Care (Signed)
Immediate Anesthesia Transfer of Care Note  Patient: Brandon Burton Mcclenny Jr.  Procedure(s) Performed: TRANSESOPHAGEAL ECHOCARDIOGRAM CARDIOVERSION  Patient Location: Cath Lab  Anesthesia Type:MAC  Level of Consciousness: drowsy  Airway & Oxygen Therapy: Patient Spontanous Breathing and Patient connected to nasal cannula oxygen  Post-op Assessment: Report given to RN and Post -op Vital signs reviewed and stable  Post vital signs: Reviewed and stable  Last Vitals:  Vitals Value Taken Time  BP    Temp    Pulse    Resp    SpO2      Last Pain:  Vitals:   05/11/22 1010  TempSrc:   PainSc: 0-No pain         Complications: No notable events documented.

## 2022-05-11 NOTE — Anesthesia Preprocedure Evaluation (Signed)
Anesthesia Evaluation  Patient identified by MRN, date of birth, ID band Patient awake    Reviewed: Allergy & Precautions, H&P , NPO status , Patient's Chart, lab work & pertinent test results  Airway Mallampati: II  TM Distance: >3 FB Neck ROM: Full    Dental no notable dental hx.    Pulmonary sleep apnea , former smoker   Pulmonary exam normal breath sounds clear to auscultation       Cardiovascular hypertension, Pt. on medications + Peripheral Vascular Disease  + dysrhythmias Atrial Fibrillation + Valvular Problems/Murmurs MR  Rhythm:Irregular Rate:Normal + Systolic murmurs 1. Left ventricular ejection fraction, by estimation, is 55 to 60%. The  left ventricle has normal function. The left ventricle has no regional  wall motion abnormalities. There is mild asymmetric left ventricular  hypertrophy of the basal-septal segment.  Diastolic function indeterminant due to AFib.   2. Right ventricular systolic function is normal. The right ventricular  size is normal. Tricuspid regurgitation signal is inadequate for assessing  PA pressure.   3. Left atrial size was severely dilated.   4. Right atrial size was mildly dilated.   5. The mitral valve is normal in structure. Mild mitral valve  regurgitation.   6. The aortic valve is tricuspid. There is mild calcification of the  aortic valve. There is mild thickening of the aortic valve. Aortic valve  regurgitation is trivial. Aortic valve sclerosis/calcification is present,  without any evidence of aortic  stenosis.   7. Aortic dilatation noted. There is mild dilatation of the aortic root,  measuring 41 mm. There is moderate dilatation of the ascending aorta,  measuring 47 mm. Recommend CTA for further evaluation of aorta.   8. The inferior vena cava is normal in size with greater than 50%  respiratory variability, suggesting right atrial pressure of 3 mmHg.   Comparison(s): Compared  to prior TTE in 2018, patient is now in Afib and  the ascending aorta is moderately diltated (previously measured at 43mm  per my review of old study). Recommend CTA to further evaluate aorta.     Neuro/Psych negative neurological ROS  negative psych ROS   GI/Hepatic negative GI ROS, Neg liver ROS,,,  Endo/Other  negative endocrine ROS    Renal/GU negative Renal ROS  negative genitourinary   Musculoskeletal negative musculoskeletal ROS (+)    Abdominal   Peds negative pediatric ROS (+)  Hematology negative hematology ROS (+)   Anesthesia Other Findings   Reproductive/Obstetrics negative OB ROS                             Anesthesia Physical Anesthesia Plan  ASA: 3  Anesthesia Plan: General   Post-op Pain Management: Minimal or no pain anticipated   Induction: Intravenous  PONV Risk Score and Plan: Propofol infusion and Treatment may vary due to age or medical condition  Airway Management Planned:   Additional Equipment:   Intra-op Plan:   Post-operative Plan:   Informed Consent: I have reviewed the patients History and Physical, chart, labs and discussed the procedure including the risks, benefits and alternatives for the proposed anesthesia with the patient or authorized representative who has indicated his/her understanding and acceptance.     Dental advisory given  Plan Discussed with: CRNA and Surgeon  Anesthesia Plan Comments: (MAC for TEE, GA or cardioversion)       Anesthesia Quick Evaluation

## 2022-05-11 NOTE — Discharge Instructions (Signed)

## 2022-05-11 NOTE — Anesthesia Postprocedure Evaluation (Signed)
Anesthesia Post Note  Patient: Brandon [name].  Procedure(s) Performed: TRANSESOPHAGEAL ECHOCARDIOGRAM CARDIOVERSION     Patient location during evaluation: PACU Anesthesia Type: General Level of consciousness: awake and alert Pain management: pain level controlled Vital Signs Assessment: post-procedure vital signs reviewed and stable Respiratory status: spontaneous breathing, nonlabored ventilation, respiratory function stable and patient connected to nasal cannula oxygen Cardiovascular status: blood pressure returned to baseline and stable Postop Assessment: no apparent nausea or vomiting Anesthetic complications: no  No notable events documented.  Last Vitals:  Vitals:   05/11/22 1010 05/11/22 1123  BP: (!) 146/101   Pulse: 62 67  Resp: 15 15  Temp:  36.8 C  SpO2: 95% 97%    Last Pain:  Vitals:   05/11/22 1123  TempSrc:   PainSc: 0-No pain                 Aiana Nordquist S

## 2022-05-11 NOTE — Interval H&P Note (Signed)
History and Physical Interval Note:  05/11/2022 9:54 AM  Brandon B Paquette Jr.  has presented today for surgery, with the diagnosis of AFIB.  The various methods of treatment have been discussed with the patient and family. After consideration of risks, benefits and other options for treatment, the patient has consented to  Procedure(s): TRANSESOPHAGEAL ECHOCARDIOGRAM (N/A) CARDIOVERSION (N/A) as a surgical intervention.  The patient's history has been reviewed, patient examined, no change in status, stable for surgery.  I have reviewed the patient's chart and labs.  Questions were answered to the patient's satisfaction.     [name] [name]

## 2022-05-11 NOTE — Progress Notes (Signed)
  Echocardiogram Echocardiogram Transesophageal has been performed.  Brandon [name] 05/11/2022, 11:11 AM

## 2022-05-13 DIAGNOSIS — M48061 Spinal stenosis, lumbar region without neurogenic claudication: Secondary | ICD-10-CM | POA: Diagnosis not present

## 2022-05-13 DIAGNOSIS — M4726 Other spondylosis with radiculopathy, lumbar region: Secondary | ICD-10-CM | POA: Diagnosis not present

## 2022-05-13 DIAGNOSIS — M5416 Radiculopathy, lumbar region: Secondary | ICD-10-CM | POA: Diagnosis not present

## 2022-05-13 DIAGNOSIS — M419 Scoliosis, unspecified: Secondary | ICD-10-CM | POA: Diagnosis not present

## 2022-05-13 DIAGNOSIS — M545 Low back pain, unspecified: Secondary | ICD-10-CM | POA: Diagnosis not present

## 2022-05-14 DIAGNOSIS — M48061 Spinal stenosis, lumbar region without neurogenic claudication: Secondary | ICD-10-CM | POA: Diagnosis not present

## 2022-05-16 ENCOUNTER — Other Ambulatory Visit: Payer: Self-pay | Admitting: Internal Medicine

## 2022-05-16 DIAGNOSIS — F331 Major depressive disorder, recurrent, moderate: Secondary | ICD-10-CM

## 2022-05-30 ENCOUNTER — Other Ambulatory Visit: Payer: Self-pay | Admitting: Internal Medicine

## 2022-05-30 DIAGNOSIS — E876 Hypokalemia: Secondary | ICD-10-CM

## 2022-05-30 DIAGNOSIS — E269 Hyperaldosteronism, unspecified: Secondary | ICD-10-CM

## 2022-06-02 ENCOUNTER — Encounter: Payer: Self-pay | Admitting: Cardiovascular Disease

## 2022-06-02 ENCOUNTER — Ambulatory Visit: Payer: Medicare Other | Attending: Cardiovascular Disease | Admitting: Cardiovascular Disease

## 2022-06-02 VITALS — BP 106/68 | HR 74 | Ht 76.0 in | Wt 280.0 lb

## 2022-06-02 DIAGNOSIS — I4819 Other persistent atrial fibrillation: Secondary | ICD-10-CM | POA: Insufficient documentation

## 2022-06-02 NOTE — Patient Instructions (Signed)
Medication Instructions:  START Tikosyn - we will contact you to set up hospitalization to start this medication  *If you need a refill on your cardiac medications before your next appointment, please call your pharmacy*   Follow-Up: At [facility], you and your health needs are our priority.  As part of our continuing mission to provide you with exceptional heart care, we have created designated Provider Care Teams.  These Care Teams include your primary Cardiologist (physician) and Advanced Practice Providers (APPs -  Physician Assistants and Nurse Practitioners) who all work together to provide you with the care you need, when you need it.  We recommend signing up for the patient portal called "MyChart".  Sign up information is provided on this After Visit Summary.  MyChart is used to connect with patients for Virtual Visits (Telemedicine).  Patients are able to view lab/test results, encounter notes, upcoming appointments, etc.  Non-urgent messages can be sent to your provider as well.   To learn more about what you can do with MyChart, go to [email or web address].    Your next appointment:   We will schedule for you after tikosyn start  Provider:   York Pellant, MD

## 2022-06-02 NOTE — Progress Notes (Signed)
Electrophysiology Office Note:    Date:  06/02/2022   ID:  Brandon [name]., DOB Apr 14, 1950, MRN [id number]  PCP:  Etta Grandchild, MD   [facility] Providers Cardiologist:  Lewayne Bunting, MD Electrophysiologist:  Maurice Small, MD     Referring MD: Etta Grandchild, MD   History of Present Illness:    Brandon [name]. is a 72 y.o. male with a hx listed below, significant for persistent atrial fibrillation, AVNRT s/p ablation referred for arrhythmia management.  He underwent an ablation by Dr. Johney Frame for AF in the past. He has also had an ablation for AVNRT by Dr.  Ladona Ridgel.  He lives in [place].  He notes that he is very easily fatigued and is not nearly as capable as he was prior to recurrence of atrial fibrillation.  He had a repeat A-fib ablation and mapping of the atrium in February 2024.  He was noted to have a small amount of breakthrough at the right anterior veins.  There is also evidence of a prior posterior wall ablation with some breakthrough along the roof line.  200 J cardioversion was unsuccessful.  We were able to return to sinus rhythm with a 360 J shock.  He was seen in follow-up in A-fib clinic and, not surprisingly, had recurrence of atrial fibrillation.  He had a repeat cardioversion in May, but this was unsuccessful.   He is back in atrial fibrillation and very symptomatic. He is moving from [place] to an apartment. He will have pool access and hopes to increase his exercise.   Past Medical History:  Diagnosis Date   Anxiety    Arthritis    Benign neoplasm of colon    Benign prostatic hyperplasia with urinary obstruction    Bilateral inguinal hernia 05/29/2011   Bilateral pulmonary embolism (HCC) 3/14   admitted to [facility],  treated with Xarelto   Breast lump 03/29/2019   Calculus of kidney 10/21/2016   Cataract    Chronic bilateral low back pain with sciatica 03/21/2012   Chronic pain    Finger mass, right 10/17/2020   History of  alcohol abuse 03/07/2016   History of colon polyps 11/18/2015   History of kidney stones    History of pulmonary embolism 03/19/2012   Hyperlipemia    Hypertension    Insomnia    Insomnia due to medical condition 12/23/2016   Polyuria secondary to BPH   Major depressive disorder, recurrent episode (HCC)    Mitral regurgitation 04/24/2013   Mild by TEE   Morbid obesity (HCC) 03/07/2016   Nonrheumatic mitral valve insufficiency 04/25/2013   Mild by TEE  Overview:  Overview:  Mild by TEE Overview:  Overview:  Overview:  Mild by TEE Overview:  Overview:  Mild by TEE   Obesity    OSA (obstructive sleep apnea)    noncompliant with CPAP.  07/27/13- awaiting a CPAP- unable to tolerate mask   Osteoporosis    Persistent atrial fibrillation (HCC)    Recurrent unilateral inguinal hernia 07/17/2013   Restless leg syndrome    takes gabapentin   S/P right total knee arthroplasty 05/24/2018   Sciatica    Spinal stenosis, lumbar region with neurogenic claudication    SVT (supraventricular tachycardia) 03/07/2016   Thrombus of left atrial appendage 03/09/2013   Uncomplicated asthma 06/20/2008   Overview:  Overview:  Overview:  Overview:  Qualifier: Diagnosis of  By: Tawanna Cooler MD, Eugenio Hoes Overview:  Overview:  Qualifier: Diagnosis  of  By: Tawanna Cooler MD, Eugenio Hoes   Ventral hernia, unspecified, without mention of obstruction or gangrene    right abdominal wall    Past Surgical History:  Procedure Laterality Date   ATRIAL FIBRILLATION ABLATION N/A 02/27/2022   Procedure: ATRIAL FIBRILLATION ABLATION;  Surgeon: Maurice Small, MD;  Location: [facility] INVASIVE CV LAB;  Service: Cardiovascular;  Laterality: N/A;   CARDIOVERSION N/A 03/21/2012   Procedure: CARDIOVERSION;  Surgeon: Ricki Rodriguez, MD;  Location: [facility] ENDOSCOPY;  Service: Cardiovascular;  Laterality: N/A;   CARDIOVERSION N/A 04/24/2013   Procedure: CARDIOVERSION;  Surgeon: Lars Masson, MD;  Location: [facility] ENDOSCOPY;  Service: Cardiovascular;  Laterality: N/A;    CARDIOVERSION N/A 06/27/2015   Procedure: CARDIOVERSION;  Surgeon: Laurey Morale, MD;  Location: [facility] ENDOSCOPY;  Service: Cardiovascular;  Laterality: N/A;   CARDIOVERSION N/A 11/11/2021   Procedure: CARDIOVERSION;  Surgeon: Lewayne Bunting, MD;  Location: [facility] ENDOSCOPY;  Service: Cardiovascular;  Laterality: N/A;   CARDIOVERSION N/A 05/11/2022   Procedure: CARDIOVERSION;  Surgeon: Christell Constant, MD;  Location: [facility] INVASIVE CV LAB;  Service: Cardiovascular;  Laterality: N/A;   CATARACT EXTRACTION     COLONOSCOPY W/ POLYPECTOMY     ELECTROPHYSIOLOGIC STUDY N/A 07/30/2015   Procedure: Atrial Fibrillation Ablation;  Surgeon: Hillis Range, MD;  Location: [facility] INVASIVE CV LAB;  Service: Cardiovascular;  Laterality: N/A;   EXTRACORPOREAL SHOCK WAVE LITHOTRIPSY Right 10/29/2016   Procedure: RIGHT EXTRACORPOREAL SHOCK WAVE LITHOTRIPSY (ESWL);  Surgeon: Sebastian Ache, MD;  Location: [facility] ORS;  Service: Urology;  Laterality: Right;   EYE SURGERY Right    cataract   FEMUR FRACTURE SURGERY     HERNIA REPAIR  07/06/2011   INGUINAL HERNIA REPAIR Right 07/28/2013   Procedure: RIGHT INGUINAL HERNIA REPAIR;  Surgeon: Wilmon Arms. Corliss Skains, MD;  Location: [facility] OR;  Service: General;  Laterality: Right;   INGUINAL HERNIA REPAIR Right 09/22/2016   Procedure: LAPAROSCOPIC RIGHT INGUINAL HERNIA;  Surgeon: Sheliah Hatch De Blanch, MD;  Location: [facility] ORS;  Service: General;  Laterality: Right;  With MESH   INSERTION OF MESH Right 07/28/2013   Procedure: INSERTION OF MESH;  Surgeon: Wilmon Arms. Corliss Skains, MD;  Location: [facility] OR;  Service: General;  Laterality: Right;   KNEE ARTHROSCOPY     left   REPLACEMENT TOTAL KNEE Left    ROTATOR CUFF REPAIR     right   ROTATOR CUFF REPAIR Left 03/08/2014   DR SUPPLE   SHOULDER ARTHROSCOPY WITH ROTATOR CUFF REPAIR AND SUBACROMIAL DECOMPRESSION Left 03/08/2014   Procedure: LEFT SHOULDER ARTHROSCOPY WITH ROTATOR CUFF REPAIR/SUBACROMIAL DECOMPRESSION/DISTAL CLAVICLE RESECTION;  Surgeon: Senaida Lange, MD;  Location: [facility] OR;  Service: Orthopedics;  Laterality: Left;   SVT ABLATION N/A 01/23/2019   Procedure: SVT ABLATION;  Surgeon: Marinus Maw, MD;  Location: [facility] INVASIVE CV LAB;  Service: Cardiovascular;  Laterality: N/A;   TEE WITHOUT CARDIOVERSION N/A 03/21/2012   Procedure: TRANSESOPHAGEAL ECHOCARDIOGRAM (TEE);  Surgeon: Ricki Rodriguez, MD;  Location: [facility] ENDOSCOPY;  Service: Cardiovascular;  Laterality: N/A;   TEE WITHOUT CARDIOVERSION N/A 04/24/2013   Procedure: TRANSESOPHAGEAL ECHOCARDIOGRAM (TEE);  Surgeon: Lars Masson, MD;  Location: [facility] ENDOSCOPY;  Service: Cardiovascular;  Laterality: N/A;   TEE WITHOUT CARDIOVERSION N/A 07/29/2015   Procedure: TRANSESOPHAGEAL ECHOCARDIOGRAM (TEE);  Surgeon: Pricilla Riffle, MD;  Location: [facility] ENDOSCOPY;  Service: Cardiovascular;  Laterality: N/A;   TEE WITHOUT CARDIOVERSION N/A 05/11/2022   Procedure: TRANSESOPHAGEAL ECHOCARDIOGRAM;  Surgeon: Christell Constant, MD;  [facility]: [facility] INVASIVE CV  LAB;  Service: Cardiovascular;  Laterality: N/A;   TONSILLECTOMY     TOTAL KNEE ARTHROPLASTY Right 05/24/2018   Procedure: RIGHT TOTAL KNEE ARTHROPLASTY;  Surgeon: Durene Romans, MD;  Location: [facility] ORS;  Service: Orthopedics;  Laterality: Right;  70 mins    Current Medications: Current Meds  Medication Sig   amLODipine (NORVASC) 5 MG tablet Take 1 tablet (5 mg total) by mouth daily.   apixaban (ELIQUIS) 5 MG TABS tablet TAKE 1 TABLET BY MOUTH TWICE A DAY   carvedilol (COREG) 25 MG tablet TAKE 1 TABLET (25 MG TOTAL) BY MOUTH TWICE A DAY WITH MEALS   diphenhydramine-acetaminophen (TYLENOL PM) 25-500 MG TABS tablet Take 2 tablets by mouth at bedtime as needed (sleep).   dutasteride (AVODART) 0.5 MG capsule Take 0.5 mg by mouth daily.   escitalopram (LEXAPRO) 20 MG tablet TAKE 1 TABLET BY MOUTH EVERY DAY   fluticasone (FLONASE) 50 MCG/ACT nasal spray Place 2 sprays into both nostrils daily. (Patient taking differently: Place 2 sprays into both nostrils  daily as needed for allergies.)   gabapentin (NEURONTIN) 800 MG tablet Take 1 tablet (800 mg total) by mouth 3 (three) times daily. (Patient taking differently: Take 800 mg by mouth 2 (two) times daily.)   memantine (NAMENDA) 10 MG tablet Take 1 tablet (10 mg total) by mouth 2 (two) times daily.   potassium chloride SA (KLOR-CON M20) 20 MEQ tablet TAKE 1 TABLET BY MOUTH EVERY DAY   spironolactone (ALDACTONE) 25 MG tablet TAKE 1 TABLET (25 MG TOTAL) BY MOUTH DAILY.   tamsulosin (FLOMAX) 0.4 MG CAPS capsule TAKE 1 CAPSULE BY MOUTH 2 TIMES DAILY.   triamcinolone cream (KENALOG) 0.1 % Apply 1 Application topically 2 (two) times daily as needed (rash).     Allergies:   Ace inhibitors and Albuterol   Social History   Socioeconomic History   Marital status: Married    Spouse name: Not on file   Number of children: 2   Years of education: college   Highest education level: Not on file  Occupational History   Not on file  Tobacco Use   Smoking status: Former    Packs/day: 0.50    Years: 5.00    Additional pack years: 0.00    Total pack years: 2.50    Types: Cigarettes    Quit date: 39    Years since quitting: 48.4   Smokeless tobacco: Never   Tobacco comments:    Former smoker 03/27/22  Vaping Use   Vaping Use: Never used  Substance and Sexual Activity   Alcohol use: No    Comment: Former EtOH abuse, stopped 06/2016   Drug use: No    Comment: negative hx for IV drug abuse   Sexual activity: Not on file  Other Topics Concern   Not on file  Social History Narrative   Lives with wife in [place]   Retired Investment banker, operational   Wife lives in town (still married but lives separate)   Has biological daughter in [place] (Baudette Calvillo)   Social Determinants of Health   Financial Resource Strain: Low Risk  (12/10/2021)   Overall Financial Resource Strain (CARDIA)    Difficulty of Paying Living Expenses: Not hard at all  Food Insecurity: No Food Insecurity (12/10/2021)   Hunger  Vital Sign    Worried About Running Out of Food in the Last Year: Never true    Ran Out of Food in the Last Year: Never true  Transportation  Needs: No Transportation Needs (12/10/2021)   PRAPARE - Administrator, Civil Service (Medical): No    Lack of Transportation (Non-Medical): No  Physical Activity: Inactive (12/10/2021)   Exercise Vital Sign    Days of Exercise per Week: 0 days    Minutes of Exercise per Session: 0 min  Stress: No Stress Concern Present (12/10/2021)   [employer] of Occupational Health - Occupational Stress Questionnaire    Feeling of Stress : Not at all  Social Connections: Moderately Integrated (12/10/2021)   Social Connection and Isolation Panel [NHANES]    Frequency of Communication with Friends and Family: More than three times a week    Frequency of Social Gatherings with Friends and Family: Once a week    Attends Religious Services: 1 to 4 times per year    Active Member of [employer] or Organizations: Yes    Attends Banker Meetings: 1 to 4 times per year    Marital Status: Separated     Family History: The patient's family history includes Asthma in his mother; CVA in an other family member; Cancer in his father and paternal grandmother; Diabetes in an other family member; Heart failure in his mother; Hypertension in his mother.  ROS:   Please see the history of present illness.    All other systems reviewed and are negative.  EKGs/Labs/Other Studies Reviewed Today:      EKG:  Last EKG results: today - AF, V-rate 74 bpm  TTE: severe LAE   Recent Labs: 09/26/2021: ALT 9 10/19/2021: Magnesium 1.8 05/07/2022: BUN 16; Creatinine, Ser 0.89; Hemoglobin 13.8; Platelets 173; Potassium 3.7; Sodium 138     Physical Exam:    VS:  BP 106/68   Pulse 74   Ht 6\' 4"  (1.93 m)   Wt 280 lb (127 kg)   SpO2 94%   BMI 34.08 kg/m     Wt Readings from Last 3 Encounters:  06/02/22 280 lb (127 kg)  05/11/22 283 lb (128.4 kg)  05/07/22  283 lb 9.6 oz (128.6 kg)     GEN: Well nourished, well developed in no acute distress CARDIAC: Irregular rhythm, no murmurs, rubs, gallops RESPIRATORY:  Normal work of breathing MUSCULOSKELETAL: no edema    ASSESSMENT & PLAN:    Persistent AF: Status post ablation by Dr. Johney Frame.  Very symptomatic. His repeat ablation did not show many targets for ablation. He has, not surprisingly, had recurrence and is very symptomatic. Will arrange Tikosyn load. We discussed the risks which include risk of Tikosyn, which include life threatening arrhythmias necessitating hospitalization to initiate the medication.  AVNRT: s/p ablation OSA: will need to be compliant with CPAP to reduce risk of AF recurrence        Medication Adjustments/Labs and Tests Ordered: Current medicines are reviewed at length with the patient today.  Concerns regarding medicines are outlined above.  No orders of the defined types were placed in this encounter.  No orders of the defined types were placed in this encounter.    Signed, Maurice Small, MD  06/02/2022 10:54 AM    [facility]

## 2022-06-03 ENCOUNTER — Encounter (HOSPITAL_COMMUNITY): Payer: Self-pay

## 2022-06-03 ENCOUNTER — Telehealth: Payer: Self-pay | Admitting: Pharmacist

## 2022-06-03 NOTE — Telephone Encounter (Signed)
Medication list reviewed in anticipation of upcoming Tikosyn initiation. Patient is taking 1 potentially QTc prolonging medications.   Concurrent use of ESCITALOPRAM and QT INTERVAL-PROLONGING DRUGS may result in an increased risk of QT interval prolongation. Recommend reaching out to PCP to see if antidepressant can be switched to duloxetine.  Patient is anticoagulated on Eliquis on the appropriate dose. Please ensure that patient has not missed any anticoagulation doses in the 3 weeks prior to Tikosyn initiation.   Patient will need to be counseled to avoid use of Benadryl while on Tikosyn and in the 2-3 days prior to Tikosyn initiation. Patient has Tylenol PM on med list which includes Benadryl.

## 2022-06-03 NOTE — Telephone Encounter (Signed)
-----   Message from Shona Simpson, RN sent at 06/02/2022  4:11 PM EDT ----- Regarding: FW: Tikosyn start Pt for tikosyn please review meds thanks stacy  ----- Message ----- From: Festus Holts, RN Sent: 06/02/2022  11:08 AM EDT To: Shona Simpson, RN Subject: Tikosyn start                                  Patient seen today in office by Dr Nelly Laurence - needs to be started on Tikosyn. Patient instructed to contact pharmacy/insurance to confirm cost of medication prior to admit.  Thank you,  Brayton Caves, RN

## 2022-06-07 ENCOUNTER — Encounter: Payer: Self-pay | Admitting: Internal Medicine

## 2022-06-09 ENCOUNTER — Ambulatory Visit (INDEPENDENT_AMBULATORY_CARE_PROVIDER_SITE_OTHER): Payer: Medicare Other

## 2022-06-09 ENCOUNTER — Ambulatory Visit (INDEPENDENT_AMBULATORY_CARE_PROVIDER_SITE_OTHER): Payer: Medicare Other | Admitting: Internal Medicine

## 2022-06-09 VITALS — BP 136/88 | HR 72 | Temp 98.2°F | Resp 16 | Ht 76.0 in | Wt 276.0 lb

## 2022-06-09 DIAGNOSIS — R109 Unspecified abdominal pain: Secondary | ICD-10-CM | POA: Diagnosis not present

## 2022-06-09 DIAGNOSIS — R10817 Generalized abdominal tenderness: Secondary | ICD-10-CM | POA: Insufficient documentation

## 2022-06-09 DIAGNOSIS — E785 Hyperlipidemia, unspecified: Secondary | ICD-10-CM

## 2022-06-09 DIAGNOSIS — G2581 Restless legs syndrome: Secondary | ICD-10-CM

## 2022-06-09 DIAGNOSIS — L281 Prurigo nodularis: Secondary | ICD-10-CM | POA: Diagnosis not present

## 2022-06-09 DIAGNOSIS — I10 Essential (primary) hypertension: Secondary | ICD-10-CM | POA: Diagnosis not present

## 2022-06-09 DIAGNOSIS — R110 Nausea: Secondary | ICD-10-CM | POA: Diagnosis not present

## 2022-06-09 LAB — BASIC METABOLIC PANEL
BUN: 12 mg/dL (ref 6–23)
CO2: 30 mEq/L (ref 19–32)
Calcium: 9.5 mg/dL (ref 8.4–10.5)
Chloride: 101 mEq/L (ref 96–112)
Creatinine, Ser: 0.94 mg/dL (ref 0.40–1.50)
GFR: 80.16 mL/min (ref >60.00)
Glucose, Bld: 111 mg/dL — ABNORMAL HIGH (ref 70–99)
Potassium: 4 mEq/L (ref 3.5–5.1)
Sodium: 140 mEq/L (ref 135–145)

## 2022-06-09 LAB — CBC WITH DIFFERENTIAL/PLATELET
Basophils Absolute: 0 10*3/uL (ref 0.0–0.1)
Basophils Relative: 0.5 % (ref 0.0–3.0)
Eosinophils Absolute: 0.1 10*3/uL (ref 0.0–0.7)
Eosinophils Relative: 1.2 % (ref 0.0–5.0)
HCT: 44.3 % (ref 39.0–52.0)
Hemoglobin: 14.7 g/dL (ref 13.0–17.0)
Lymphocytes Relative: 15.4 % (ref 12.0–46.0)
Lymphs Abs: 1.3 10*3/uL (ref 0.7–4.0)
MCHC: 33.3 g/dL (ref 30.0–36.0)
MCV: 91.9 fl (ref 78.0–100.0)
Monocytes Absolute: 0.7 10*3/uL (ref 0.1–1.0)
Monocytes Relative: 8.5 % (ref 3.0–12.0)
Neutro Abs: 6.3 10*3/uL (ref 1.4–7.7)
Neutrophils Relative %: 74.4 % (ref 43.0–77.0)
Platelets: 182 10*3/uL (ref 150.0–400.0)
RBC: 4.82 Mil/uL (ref 4.22–5.81)
RDW: 13.9 % (ref 11.5–15.5)
WBC: 8.5 10*3/uL (ref 4.0–10.5)

## 2022-06-09 LAB — HEPATIC FUNCTION PANEL
ALT: 11 U/L (ref 0–53)
AST: 15 U/L (ref 0–37)
Albumin: 4.4 g/dL (ref 3.5–5.2)
Alkaline Phosphatase: 69 U/L (ref 39–117)
Bilirubin, Direct: 0.1 mg/dL (ref 0.0–0.3)
Total Bilirubin: 0.9 mg/dL (ref 0.2–1.2)
Total Protein: 7.4 g/dL (ref 6.0–8.3)

## 2022-06-09 LAB — LIPID PANEL
Cholesterol: 186 mg/dL (ref 0–200)
HDL: 36.4 mg/dL — ABNORMAL LOW (ref >39.00)
LDL Cholesterol: 131 mg/dL — ABNORMAL HIGH (ref 0–99)
NonHDL: 150.04
Total CHOL/HDL Ratio: 5
Triglycerides: 94 mg/dL (ref 0.0–149.0)
VLDL: 18.8 mg/dL (ref 0.0–40.0)

## 2022-06-09 LAB — AMYLASE: Amylase: 20 U/L — ABNORMAL LOW (ref 27–131)

## 2022-06-09 LAB — LIPASE: Lipase: 9 U/L — ABNORMAL LOW (ref 11.0–59.0)

## 2022-06-09 LAB — TSH: TSH: 1.85 u[IU]/mL (ref 0.35–5.50)

## 2022-06-09 MED ORDER — GABAPENTIN 800 MG PO TABS: 800.0000 mg | ORAL_TABLET | Freq: Three times a day (TID) | ORAL | 0 refills | Status: DC

## 2022-06-09 MED ORDER — ATORVASTATIN CALCIUM 40 MG PO TABS
40.0000 mg | ORAL_TABLET | Freq: Every day | ORAL | 1 refills | Status: DC
Start: 2022-06-09 — End: 2022-09-07

## 2022-06-09 NOTE — Progress Notes (Signed)
Subjective:  Patient ID: Brandon [name]., male    DOB: August 29, 1950  Age: 72 y.o. MRN: [id number]  CC: Abdominal Pain   HPI [name] presents for f/up -  He complains of a 3-day history of nausea, vomiting, and diarrhea alternating with constipation.  Outpatient Medications Prior to Visit  Medication Sig Dispense Refill   amLODipine (NORVASC) 5 MG tablet Take 1 tablet (5 mg total) by mouth daily. 90 tablet 3   carvedilol (COREG) 25 MG tablet TAKE 1 TABLET (25 MG TOTAL) BY MOUTH TWICE A DAY WITH MEALS 180 tablet 2   dutasteride (AVODART) 0.5 MG capsule Take 0.5 mg by mouth daily.     escitalopram (LEXAPRO) 20 MG tablet TAKE 1 TABLET BY MOUTH EVERY DAY 90 tablet 1   fluticasone (FLONASE) 50 MCG/ACT nasal spray Place 2 sprays into both nostrils daily. (Patient taking differently: Place 2 sprays into both nostrils daily as needed for allergies.) 48 g 1   memantine (NAMENDA) 10 MG tablet Take 1 tablet (10 mg total) by mouth 2 (two) times daily. 180 tablet 1   potassium chloride SA (KLOR-CON M20) 20 MEQ tablet TAKE 1 TABLET BY MOUTH EVERY DAY 90 tablet 0   spironolactone (ALDACTONE) 25 MG tablet TAKE 1 TABLET (25 MG TOTAL) BY MOUTH DAILY. 90 tablet 0   tamsulosin (FLOMAX) 0.4 MG CAPS capsule TAKE 1 CAPSULE BY MOUTH 2 TIMES DAILY. 180 capsule 1   triamcinolone cream (KENALOG) 0.1 % Apply 1 Application topically 2 (two) times daily as needed (rash).     apixaban (ELIQUIS) 5 MG TABS tablet TAKE 1 TABLET BY MOUTH TWICE A DAY 60 tablet 5   diphenhydramine-acetaminophen (TYLENOL PM) 25-500 MG TABS tablet Take 2 tablets by mouth at bedtime as needed (sleep).     gabapentin (NEURONTIN) 800 MG tablet Take 1 tablet (800 mg total) by mouth 3 (three) times daily. (Patient taking differently: Take 800 mg by mouth 2 (two) times daily.) 270 tablet 1   No facility-administered medications prior to visit.    ROS Review of Systems  Constitutional: Negative.  Negative for diaphoresis and fatigue.  HENT:  Negative.  Negative for trouble swallowing.   Eyes: Negative.   Respiratory:  Negative for cough, chest tightness, shortness of breath and wheezing.   Cardiovascular:  Negative for chest pain, palpitations and leg swelling.  Gastrointestinal:  Positive for abdominal pain, constipation, diarrhea, nausea and vomiting. Negative for abdominal distention and blood in stool.  Endocrine: Negative.   Genitourinary: Negative.  Negative for difficulty urinating.  Musculoskeletal:  Positive for back pain.  Skin: Negative.   Neurological: Negative.  Negative for dizziness, weakness and headaches.  Hematological:  Negative for adenopathy. Does not bruise/bleed easily.  Psychiatric/Behavioral:  Positive for sleep disturbance.     Objective:  BP 136/88 (BP Location: Right Arm, Patient Position: Sitting, Cuff Size: Large)   Pulse 72   Temp 98.2 F (36.8 C) (Oral)   Resp 16   Ht 6\' 4"  (1.93 m)   Wt 276 lb (125.2 kg)   SpO2 98%   BMI 33.60 kg/m   BP Readings from Last 3 Encounters:  06/09/22 136/88  06/02/22 106/68  05/11/22 (!) 120/93    Wt Readings from Last 3 Encounters:  06/09/22 276 lb (125.2 kg)  06/02/22 280 lb (127 kg)  05/11/22 283 lb (128.4 kg)    Physical Exam Vitals reviewed.  Constitutional:      Appearance: He is not ill-appearing.  HENT:  Mouth/Throat:     Mouth: Mucous membranes are moist.  Eyes:     General: No scleral icterus.    Conjunctiva/sclera: Conjunctivae normal.  Cardiovascular:     Rate and Rhythm: Normal rate and regular rhythm.     Heart sounds: No murmur heard. Pulmonary:     Effort: Pulmonary effort is normal.     Breath sounds: No stridor. No wheezing, rhonchi or rales.  Abdominal:     General: Abdomen is flat. Bowel sounds are normal. There is no distension.     Palpations: There is no mass.     Tenderness: There is generalized abdominal tenderness. There is no guarding or rebound.     Hernia: No hernia is present.  Musculoskeletal:         General: Normal range of motion.     Cervical back: Neck supple.     Right lower leg: No edema.     Left lower leg: No edema.  Lymphadenopathy:     Cervical: No cervical adenopathy.  Skin:    General: Skin is warm and dry.  Neurological:     General: No focal deficit present.     Mental Status: He is alert. Mental status is at baseline.  Psychiatric:        Mood and Affect: Mood normal.        Behavior: Behavior normal.     Lab Results  Component Value Date   WBC 8.5 06/09/2022   HGB 14.7 06/09/2022   HCT 44.3 06/09/2022   PLT 182.0 06/09/2022   GLUCOSE 111 (H) 06/09/2022   CHOL 186 06/09/2022   TRIG 94.0 06/09/2022   HDL 36.40 (L) 06/09/2022   LDLDIRECT 138 (H) 10/21/2016   LDLCALC 131 (H) 06/09/2022   ALT 11 06/09/2022   AST 15 06/09/2022   NA 140 06/09/2022   K 4.0 06/09/2022   CL 101 06/09/2022   CREATININE 0.94 06/09/2022   BUN 12 06/09/2022   CO2 30 06/09/2022   TSH 1.85 06/09/2022   PSA 1.64 12/25/2020   INR 2.1 04/01/2018   HGBA1C 5.7 07/09/2021    ECHO TEE  Result Date: 05/11/2022    TRANSESOPHOGEAL ECHO REPORT   Patient Name:   Brandon [name]. Date of Exam: 05/11/2022 Medical Rec #:  [id number]      Height:       76.0 in Accession #:    [id number]     Weight:       283.6 lb Date of Birth:  [birth year]      BSA:          2.570 m Patient Age:    71 years       BP:           146/101 mmHg Patient Gender: M              HR:           60 bpm. Exam Location:  Inpatient Procedure: Transesophageal Echo, Cardiac Doppler, Color Doppler and 3D Echo                                 MODIFIED REPORT: This report was modified by Riley Lam MD on 05/11/2022 due to Corrected                                Tech Information.  Indications:  Persistent atrial fibrillation  History:         Patient has prior history of Echocardiogram examinations, most                  recent 12/08/2021. Mitral Valve Disease, Arrythmias:Atrial                  Fibrillation; Risk Factors:Sleep  Apnea, Obesity, Hypertension                  and Dyslipidemia. LAA thrombus, hx of ETOH, hx of PE.  Sonographer:     Milda Smart Referring Phys:  [id number] CLINT R FENTON Diagnosing Phys: Riley Lam MD PROCEDURE: After discussion of the risks and benefits of a TEE, an informed consent was obtained from the patient. TEE procedure time was 9 minutes. The transesophogeal probe was passed without difficulty through the esophogus of the patient. Imaged were  obtained with the patient in a left lateral decubitus position. Sedation performed by different physician. The patient was monitored while under deep sedation. Anesthestetic sedation was provided intravenously by Anesthesiology: 196.3mg  of Propofol, 80mg  of Lidocaine. Image quality was good. The patient's vital signs; including heart rate, blood pressure, and oxygen saturation; remained stable throughout the procedure. The patient developed no complications during the procedure. A successful direct current cardioversion was performed at 200 joules with 1 attempt.  IMPRESSIONS  1. Left ventricular ejection fraction, by estimation, is 55 to 60%. The left ventricle has normal function.  2. Right ventricular systolic function is normal. The right ventricular size is normal.  3. Left atrial size was severely dilated. No left atrial/left atrial appendage thrombus was detected.  4. Right atrial size was mildly dilated.  5. The mitral valve is abnormal. Moderate mitral valve regurgitation.  6. The aortic valve is tricuspid. There is moderate thickening of the aortic valve. Aortic valve regurgitation is trivial.  7. Aortic dilatation noted. There is mild dilatation of the aortic root, measuring 43 mm. There is mild (Grade II) plaque involving the descending aorta.  8. Evidence of atrial level shunting detected by color flow Doppler. FINDINGS  Left Ventricle: Left ventricular ejection fraction, by estimation, is 55 to 60%. The left ventricle has normal function.  The left ventricular internal cavity size was normal in size. Right Ventricle: The right ventricular size is normal. Right vetricular wall thickness was not well visualized. Right ventricular systolic function is normal. Left Atrium: Left atrial size was severely dilated. No left atrial/left atrial appendage thrombus was detected. Right Atrium: Right atrial size was mildly dilated. Pericardium: There is no evidence of pericardial effusion. Mitral Valve: The mitral valve is abnormal. Moderate mitral valve regurgitation. Tricuspid Valve: The tricuspid valve is normal in structure. Tricuspid valve regurgitation is trivial. No evidence of tricuspid stenosis. Aortic Valve: The aortic valve is tricuspid. There is moderate thickening of the aortic valve. Aortic valve regurgitation is trivial. Pulmonic Valve: The pulmonic valve was not well visualized. Pulmonic valve regurgitation is trivial. Aorta: Aortic dilatation noted. There is mild dilatation of the aortic root, measuring 43 mm. There is mild (Grade II) plaque involving the descending aorta. IAS/Shunts: Evidence of atrial level shunting detected by color flow Doppler. Additional Comments: Spectral Doppler performed. AORTIC VALVE LVOT Vmax:   88.90 cm/s LVOT Vmean:  62.000 cm/s LVOT VTI:    0.152 m MITRAL VALVE MV Area (PHT): 5.20 cm    SHUNTS MV Decel Time: 146 msec    Systemic VTI: 0.15 m MV E velocity: 72.70 cm/s Mahesh  Izora Ribas MD Electronically signed by Riley Lam MD Signature Date/Time: 05/11/2022/4:50:33 PM    Final (Updated)    EP STUDY  Result Date: 05/11/2022 See surgical note for result.  DG ABD ACUTE 2+V W 1V CHEST  Result Date: 06/09/2022 CLINICAL DATA:  Abdominal pain, nausea EXAM: DG ABDOMEN ACUTE WITH 1 VIEW CHEST COMPARISON:  Chest x-ray 02/27/2022 FINDINGS: Linear opacity in the left lung base, likely atelectasis. Right lung clear. Heart is normal size. Tortuous aorta. Nonobstructive bowel gas pattern. No organomegaly or free air.  No suspicious calcification. IMPRESSION: No evidence of bowel obstruction or free air. Left base atelectasis. Electronically Signed   By: Charlett Nose M.D.   On: 06/09/2022 12:53   ECHO TEE  Result Date: 05/11/2022    TRANSESOPHOGEAL ECHO REPORT   Patient Name:   Brandon [name]. Date of Exam: 05/11/2022 Medical Rec #:  [id number]      Height:       76.0 in Accession #:    [id number]     Weight:       283.6 lb Date of Birth:  February 12, 1950      BSA:          2.570 m Patient Age:    71 years       BP:           146/101 mmHg Patient Gender: M              HR:           60 bpm. Exam Location:  Inpatient Procedure: Transesophageal Echo, Cardiac Doppler, Color Doppler and 3D Echo                                 MODIFIED REPORT: This report was modified by Riley Lam MD on 05/11/2022 due to Corrected                                Tech Information.  Indications:     Persistent atrial fibrillation  History:         Patient has prior history of Echocardiogram examinations, most                  recent 12/08/2021. Mitral Valve Disease, Arrythmias:Atrial                  Fibrillation; Risk Factors:Sleep Apnea, Obesity, Hypertension                  and Dyslipidemia. LAA thrombus, hx of ETOH, hx of PE.  Sonographer:     Milda Smart Referring Phys:  [id number] CLINT R FENTON Diagnosing Phys: Riley Lam MD PROCEDURE: After discussion of the risks and benefits of a TEE, an informed consent was obtained from the patient. TEE procedure time was 9 minutes. The transesophogeal probe was passed without difficulty through the esophogus of the patient. Imaged were  obtained with the patient in a left lateral decubitus position. Sedation performed by different physician. The patient was monitored while under deep sedation. Anesthestetic sedation was provided intravenously by Anesthesiology: 196.3mg  of Propofol, 80mg  of Lidocaine. Image quality was good. The patient's vital signs; including heart rate, blood pressure, and  oxygen saturation; remained stable throughout the procedure. The patient developed no complications during the procedure. A successful direct current cardioversion was performed at 200 joules with 1 attempt.  IMPRESSIONS  1. Left ventricular ejection fraction, by estimation, is 55 to 60%. The left ventricle has normal function.  2. Right ventricular systolic function is normal. The right ventricular size is normal.  3. Left atrial size was severely dilated. No left atrial/left atrial appendage thrombus was detected.  4. Right atrial size was mildly dilated.  5. The mitral valve is abnormal. Moderate mitral valve regurgitation.  6. The aortic valve is tricuspid. There is moderate thickening of the aortic valve. Aortic valve regurgitation is trivial.  7. Aortic dilatation noted. There is mild dilatation of the aortic root, measuring 43 mm. There is mild (Grade II) plaque involving the descending aorta.  8. Evidence of atrial level shunting detected by color flow Doppler. FINDINGS  Left Ventricle: Left ventricular ejection fraction, by estimation, is 55 to 60%. The left ventricle has normal function. The left ventricular internal cavity size was normal in size. Right Ventricle: The right ventricular size is normal. Right vetricular wall thickness was not well visualized. Right ventricular systolic function is normal. Left Atrium: Left atrial size was severely dilated. No left atrial/left atrial appendage thrombus was detected. Right Atrium: Right atrial size was mildly dilated. Pericardium: There is no evidence of pericardial effusion. Mitral Valve: The mitral valve is abnormal. Moderate mitral valve regurgitation. Tricuspid Valve: The tricuspid valve is normal in structure. Tricuspid valve regurgitation is trivial. No evidence of tricuspid stenosis. Aortic Valve: The aortic valve is tricuspid. There is moderate thickening of the aortic valve. Aortic valve regurgitation is trivial. Pulmonic Valve: The pulmonic valve  was not well visualized. Pulmonic valve regurgitation is trivial. Aorta: Aortic dilatation noted. There is mild dilatation of the aortic root, measuring 43 mm. There is mild (Grade II) plaque involving the descending aorta. IAS/Shunts: Evidence of atrial level shunting detected by color flow Doppler. Additional Comments: Spectral Doppler performed. AORTIC VALVE LVOT Vmax:   88.90 cm/s LVOT Vmean:  62.000 cm/s LVOT VTI:    0.152 m MITRAL VALVE MV Area (PHT): 5.20 cm    SHUNTS MV Decel Time: 146 msec    Systemic VTI: 0.15 m MV E velocity: 72.70 cm/s Riley Lam MD Electronically signed by Riley Lam MD Signature Date/Time: 05/11/2022/4:50:33 PM    Final (Updated)    EP STUDY  Result Date: 05/11/2022 See surgical note for result.     Assessment & Plan:   Generalized abdominal tenderness without rebound tenderness- Exam, labs, and x-ray are not concerning for an acute abdominal process.  This is consistent with irritable bowel syndrome. -     Lipase; Future -     Lipid panel; Future -     Basic metabolic panel; Future -     CBC with Differential/Platelet; Future -     Amylase; Future -     Urinalysis, Routine w reflex microscopic; Future -     Hepatic function panel; Future -     DG ABD ACUTE 2+V W 1V CHEST; Future  Hyperlipidemia LDL goal <130 - LDL goal achieved. Doing well on the statin  -     Lipid panel; Future -     Hepatic function panel; Future -     TSH; Future -     Atorvastatin Calcium; Take 1 tablet (40 mg total) by mouth daily.  Dispense: 90 tablet; Refill: 1  Essential hypertension- His blood pressure is adequately well-controlled. -     Basic metabolic panel; Future -     CBC with Differential/Platelet; Future -     Hepatic function panel; Future -  TSH; Future  Picker's nodules -     Gabapentin; Take 1 tablet (800 mg total) by mouth 3 (three) times daily.  Dispense: 270 tablet; Refill: 0  Restless leg syndrome -     Gabapentin; Take 1 tablet (800 mg  total) by mouth 3 (three) times daily.  Dispense: 270 tablet; Refill: 0     Follow-up: No follow-ups on file.  Sanda Linger, MD

## 2022-06-10 ENCOUNTER — Other Ambulatory Visit: Payer: Self-pay | Admitting: Internal Medicine

## 2022-06-10 DIAGNOSIS — I480 Paroxysmal atrial fibrillation: Secondary | ICD-10-CM

## 2022-06-10 NOTE — Telephone Encounter (Signed)
Eliquis 5mg  refill request received. Patient is 72 years old, weight-125.2kg, Crea- 0.94 on 06/09/22, Diagnosis-Afib, and last seen by Dr. Nelly Laurence on 06/02/22. Dose is appropriate based on dosing criteria. Will send in refill to requested pharmacy.

## 2022-06-12 NOTE — Telephone Encounter (Signed)
Pt to contact PCP regarding switching lexapro to different agent. Pt will call with update to have admission scheduled.

## 2022-06-13 ENCOUNTER — Encounter: Payer: Self-pay | Admitting: Cardiovascular Disease

## 2022-06-15 ENCOUNTER — Encounter: Payer: Self-pay | Admitting: Internal Medicine

## 2022-06-16 ENCOUNTER — Encounter: Payer: Self-pay | Admitting: Cardiovascular Disease

## 2022-06-16 ENCOUNTER — Ambulatory Visit: Payer: Medicare Other | Attending: Cardiovascular Disease | Admitting: Cardiovascular Disease

## 2022-06-16 VITALS — BP 120/76 | HR 73 | Ht 76.0 in | Wt 282.0 lb

## 2022-06-16 DIAGNOSIS — I4819 Other persistent atrial fibrillation: Secondary | ICD-10-CM | POA: Diagnosis not present

## 2022-06-16 MED ORDER — FLECAINIDE ACETATE 50 MG PO TABS: 50.0000 mg | ORAL_TABLET | Freq: Two times a day (BID) | ORAL | 3 refills | Status: DC

## 2022-06-16 NOTE — Progress Notes (Signed)
Electrophysiology Office Note:    Date:  06/16/2022   ID:  Brandon Madara., DOB 10/07/1950, MRN [id number]  PCP:  Etta Grandchild, MD   [facility] Providers Cardiologist:  Lewayne Bunting, MD Electrophysiologist:  Maurice Small, MD     Referring MD: Etta Grandchild, MD   History of Present Illness:    Brandon [name]. is a 72 y.o. male with a hx listed below, significant for persistent atrial fibrillation, AVNRT s/p ablation referred for arrhythmia management.  He underwent an ablation by Dr. Johney Frame for AF in the past. He has also had an ablation for AVNRT by Dr.  Ladona Ridgel.  He lives in [place].  He notes that he is very easily fatigued and is not nearly as capable as he was prior to recurrence of atrial fibrillation.  He had a repeat A-fib ablation and mapping of the atrium in February 2024.  He was noted to have a small amount of breakthrough at the right anterior veins.  There is also evidence of a prior posterior wall ablation with some breakthrough along the roof line.  200 J cardioversion was unsuccessful.  We were able to return to sinus rhythm with a 360 J shock.  He was seen in follow-up in A-fib clinic and, not surprisingly, had recurrence of atrial fibrillation.  He had a repeat cardioversion in May, but this was unsuccessful.   We had scheduled him for Tikosyn load, but he decided not to proceed due to fear of being in the hospital for days.  Past Medical History:  Diagnosis Date   Anxiety    Arthritis    Benign neoplasm of colon    Benign prostatic hyperplasia with urinary obstruction    Bilateral inguinal hernia 05/29/2011   Bilateral pulmonary embolism (HCC) 3/14   admitted to [facility],  treated with Xarelto   Breast lump 03/29/2019   Calculus of kidney 10/21/2016   Cataract    Chronic bilateral low back pain with sciatica 03/21/2012   Chronic pain    Finger mass, right 10/17/2020   History of alcohol abuse 03/07/2016   History of colon polyps  11/18/2015   History of kidney stones    History of pulmonary embolism 03/19/2012   Hyperlipemia    Hypertension    Insomnia    Insomnia due to medical condition 12/23/2016   Polyuria secondary to BPH   Major depressive disorder, recurrent episode (HCC)    Mitral regurgitation 04/24/2013   Mild by TEE   Morbid obesity (HCC) 03/07/2016   Nonrheumatic mitral valve insufficiency 04/25/2013   Mild by TEE  Overview:  Overview:  Mild by TEE Overview:  Overview:  Overview:  Mild by TEE Overview:  Overview:  Mild by TEE   Obesity    OSA (obstructive sleep apnea)    noncompliant with CPAP.  07/27/13- awaiting a CPAP- unable to tolerate mask   Osteoporosis    Persistent atrial fibrillation (HCC)    Recurrent unilateral inguinal hernia 07/17/2013   Restless leg syndrome    takes gabapentin   S/P right total knee arthroplasty 05/24/2018   Sciatica    Spinal stenosis, lumbar region with neurogenic claudication    SVT (supraventricular tachycardia) 03/07/2016   Thrombus of left atrial appendage 03/09/2013   Uncomplicated asthma 06/20/2008   Overview:  Overview:  Overview:  Overview:  Qualifier: Diagnosis of  By: Tawanna Cooler MD, Eugenio Hoes Overview:  Overview:  Qualifier: Diagnosis of  By: Tawanna Cooler MD, Eugenio Hoes  Ventral hernia, unspecified, without mention of obstruction or gangrene    right abdominal wall    Past Surgical History:  Procedure Laterality Date   ATRIAL FIBRILLATION ABLATION N/A 02/27/2022   Procedure: ATRIAL FIBRILLATION ABLATION;  Surgeon: Maurice Small, MD;  Location: [facility] INVASIVE CV LAB;  Service: Cardiovascular;  Laterality: N/A;   CARDIOVERSION N/A 03/21/2012   Procedure: CARDIOVERSION;  Surgeon: Ricki Rodriguez, MD;  Location: [facility] ENDOSCOPY;  Service: Cardiovascular;  Laterality: N/A;   CARDIOVERSION N/A 04/24/2013   Procedure: CARDIOVERSION;  Surgeon: Lars Masson, MD;  Location: [facility] ENDOSCOPY;  Service: Cardiovascular;  Laterality: N/A;   CARDIOVERSION N/A 06/27/2015   Procedure:  CARDIOVERSION;  Surgeon: Laurey Morale, MD;  Location: [facility] ENDOSCOPY;  Service: Cardiovascular;  Laterality: N/A;   CARDIOVERSION N/A 11/11/2021   Procedure: CARDIOVERSION;  Surgeon: Lewayne Bunting, MD;  Location: [facility] ENDOSCOPY;  Service: Cardiovascular;  Laterality: N/A;   CARDIOVERSION N/A 05/11/2022   Procedure: CARDIOVERSION;  Surgeon: Christell Constant, MD;  Location: [facility] INVASIVE CV LAB;  Service: Cardiovascular;  Laterality: N/A;   CATARACT EXTRACTION     COLONOSCOPY W/ POLYPECTOMY     ELECTROPHYSIOLOGIC STUDY N/A 07/30/2015   Procedure: Atrial Fibrillation Ablation;  Surgeon: Hillis Range, MD;  Location: [facility] INVASIVE CV LAB;  Service: Cardiovascular;  Laterality: N/A;   EXTRACORPOREAL SHOCK WAVE LITHOTRIPSY Right 10/29/2016   Procedure: RIGHT EXTRACORPOREAL SHOCK WAVE LITHOTRIPSY (ESWL);  Surgeon: Sebastian Ache, MD;  Location: [facility] ORS;  Service: Urology;  Laterality: Right;   EYE SURGERY Right    cataract   FEMUR FRACTURE SURGERY     HERNIA REPAIR  07/06/2011   INGUINAL HERNIA REPAIR Right 07/28/2013   Procedure: RIGHT INGUINAL HERNIA REPAIR;  Surgeon: Wilmon Arms. Corliss Skains, MD;  Location: [facility] OR;  Service: General;  Laterality: Right;   INGUINAL HERNIA REPAIR Right 09/22/2016   Procedure: LAPAROSCOPIC RIGHT INGUINAL HERNIA;  Surgeon: Sheliah Hatch De Blanch, MD;  Location: [facility] ORS;  Service: General;  Laterality: Right;  With MESH   INSERTION OF MESH Right 07/28/2013   Procedure: INSERTION OF MESH;  Surgeon: Wilmon Arms. Corliss Skains, MD;  Location: [facility] OR;  Service: General;  Laterality: Right;   KNEE ARTHROSCOPY     left   REPLACEMENT TOTAL KNEE Left    ROTATOR CUFF REPAIR     right   ROTATOR CUFF REPAIR Left 03/08/2014   DR SUPPLE   SHOULDER ARTHROSCOPY WITH ROTATOR CUFF REPAIR AND SUBACROMIAL DECOMPRESSION Left 03/08/2014   Procedure: LEFT SHOULDER ARTHROSCOPY WITH ROTATOR CUFF REPAIR/SUBACROMIAL DECOMPRESSION/DISTAL CLAVICLE RESECTION;  Surgeon: Senaida Lange, MD;  Location: [facility] OR;  Service:  Orthopedics;  Laterality: Left;   SVT ABLATION N/A 01/23/2019   Procedure: SVT ABLATION;  Surgeon: Marinus Maw, MD;  Location: [facility] INVASIVE CV LAB;  Service: Cardiovascular;  Laterality: N/A;   TEE WITHOUT CARDIOVERSION N/A 03/21/2012   Procedure: TRANSESOPHAGEAL ECHOCARDIOGRAM (TEE);  Surgeon: Ricki Rodriguez, MD;  Location: [facility] ENDOSCOPY;  Service: Cardiovascular;  Laterality: N/A;   TEE WITHOUT CARDIOVERSION N/A 04/24/2013   Procedure: TRANSESOPHAGEAL ECHOCARDIOGRAM (TEE);  Surgeon: Lars Masson, MD;  Location: [facility] ENDOSCOPY;  Service: Cardiovascular;  Laterality: N/A;   TEE WITHOUT CARDIOVERSION N/A 07/29/2015   Procedure: TRANSESOPHAGEAL ECHOCARDIOGRAM (TEE);  Surgeon: Pricilla Riffle, MD;  Location: [facility] ENDOSCOPY;  Service: Cardiovascular;  Laterality: N/A;   TEE WITHOUT CARDIOVERSION N/A 05/11/2022   Procedure: TRANSESOPHAGEAL ECHOCARDIOGRAM;  Surgeon: Christell Constant, MD;  Location: [facility] INVASIVE CV LAB;  Service: Cardiovascular;  Laterality: N/A;  TONSILLECTOMY     TOTAL KNEE ARTHROPLASTY Right 05/24/2018   Procedure: RIGHT TOTAL KNEE ARTHROPLASTY;  Surgeon: Durene Romans, MD;  Location: [facility] ORS;  Service: Orthopedics;  Laterality: Right;  70 mins    Current Medications: Current Meds  Medication Sig   amLODipine (NORVASC) 5 MG tablet Take 1 tablet (5 mg total) by mouth daily.   atorvastatin (LIPITOR) 40 MG tablet Take 1 tablet (40 mg total) by mouth daily.   carvedilol (COREG) 25 MG tablet TAKE 1 TABLET (25 MG TOTAL) BY MOUTH TWICE A DAY WITH MEALS   dutasteride (AVODART) 0.5 MG capsule Take 0.5 mg by mouth daily.   ELIQUIS 5 MG TABS tablet TAKE 1 TABLET BY MOUTH TWICE A DAY   escitalopram (LEXAPRO) 20 MG tablet TAKE 1 TABLET BY MOUTH EVERY DAY   fluticasone (FLONASE) 50 MCG/ACT nasal spray Place 2 sprays into both nostrils daily. (Patient taking differently: Place 2 sprays into both nostrils daily as needed for allergies.)   gabapentin (NEURONTIN) 800 MG tablet Take 1 tablet (800  mg total) by mouth 3 (three) times daily.   memantine (NAMENDA) 10 MG tablet Take 1 tablet (10 mg total) by mouth 2 (two) times daily.   potassium chloride SA (KLOR-CON M20) 20 MEQ tablet TAKE 1 TABLET BY MOUTH EVERY DAY   spironolactone (ALDACTONE) 25 MG tablet TAKE 1 TABLET (25 MG TOTAL) BY MOUTH DAILY.   tamsulosin (FLOMAX) 0.4 MG CAPS capsule TAKE 1 CAPSULE BY MOUTH 2 TIMES DAILY.   triamcinolone cream (KENALOG) 0.1 % Apply 1 Application topically 2 (two) times daily as needed (rash).     Allergies:   Ace inhibitors and Albuterol   Social History   Socioeconomic History   Marital status: Married    Spouse name: Not on file   Number of children: 2   Years of education: college   Highest education level: Bachelor's degree (e.g., BA, AB, BS)  Occupational History   Not on file  Tobacco Use   Smoking status: Former    Packs/day: 0.50    Years: 5.00    Additional pack years: 0.00    Total pack years: 2.50    Types: Cigarettes    Quit date: 91    Years since quitting: 48.4   Smokeless tobacco: Never   Tobacco comments:    Former smoker 03/27/22  Vaping Use   Vaping Use: Never used  Substance and Sexual Activity   Alcohol use: No    Comment: Former EtOH abuse, stopped 06/2016   Drug use: No    Comment: negative hx for IV drug abuse   Sexual activity: Not on file  Other Topics Concern   Not on file  Social History Narrative   Lives with wife in [place]   Retired Investment banker, operational   Wife lives in town (still married but lives separate)   Has biological daughter in [place] ([place])   Social Determinants of Health   Financial Resource Strain: Low Risk  (06/09/2022)   Overall Financial Resource Strain (CARDIA)    Difficulty of Paying Living Expenses: Not hard at all  Food Insecurity: No Food Insecurity (06/09/2022)   Hunger Vital Sign    Worried About Running Out of Food in the Last Year: Never true    Ran Out of Food in the Last Year: Never true   Transportation Needs: No Transportation Needs (06/09/2022)   PRAPARE - Transportation    Lack of Transportation (Medical): No    Lack  of Transportation (Non-Medical): No  Physical Activity: Inactive (06/09/2022)   Exercise Vital Sign    Days of Exercise per Week: 0 days    Minutes of Exercise per Session: 0 min  Stress: Stress Concern Present (06/09/2022)   [employer] of Occupational Health - Occupational Stress Questionnaire    Feeling of Stress : Rather much  Social Connections: Socially Isolated (06/09/2022)   Social Connection and Isolation Panel [NHANES]    Frequency of Communication with Friends and Family: Once a week    Frequency of Social Gatherings with Friends and Family: Never    Attends Religious Services: Never    Database administrator or Organizations: No    Attends Engineer, structural: 1 to 4 times per year    Marital Status: Separated     Family History: The patient's family history includes Asthma in his mother; CVA in an other family member; Cancer in his father and paternal grandmother; Diabetes in an other family member; Heart failure in his mother; Hypertension in his mother.  ROS:   Please see the history of present illness.    All other systems reviewed and are negative.  EKGs/Labs/Other Studies Reviewed Today:      EKG:  Last EKG results: today - AF, V-rate 73 bpm  TTE: severe LAE   Recent Labs: 10/19/2021: Magnesium 1.8 06/09/2022: ALT 11; BUN 12; Creatinine, Ser 0.94; Hemoglobin 14.7; Platelets 182.0; Potassium 4.0; Sodium 140; TSH 1.85     Physical Exam:    VS:  BP 120/76   Pulse 73   Ht 6\' 4"  (1.93 m)   Wt 282 lb (127.9 kg)   SpO2 96%   BMI 34.33 kg/m     Wt Readings from Last 3 Encounters:  06/16/22 282 lb (127.9 kg)  06/09/22 276 lb (125.2 kg)  06/02/22 280 lb (127 kg)     GEN: Well nourished, well developed in no acute distress CARDIAC: Irregular rhythm, no murmurs, rubs, gallops RESPIRATORY:  Normal work of  breathing MUSCULOSKELETAL: no edema    ASSESSMENT & PLAN:    Persistent AF: Status post ablation by Dr. Johney Frame, repeat ablation by me.  Very symptomatic. His repeat ablation did not show many targets for ablation. He has, not surprisingly, had recurrence and is very symptomatic. He does not want to be hospitalized for Tikosyn load. PR is about 200 on ECG, but heart is structurally normal. We discussed flecainide and amiodarone - the relative risks and benefits of both medications. Taking a shared decision approach, he would like to try flecainide. He is aware that there is potential for life threatening arrhythmia though the chance of this is  very low. Start flecainide 50mg  PO BID Schedule for DCCV -- he missed eliquis last night. Will need to schedule him out 3 weeks.    AVNRT: s/p ablation OSA: will need to be compliant with CPAP to reduce risk of AF recurrence        Medication Adjustments/Labs and Tests Ordered: Current medicines are reviewed at length with the patient today.  Concerns regarding medicines are outlined above.  No orders of the defined types were placed in this encounter.  No orders of the defined types were placed in this encounter.    Signed, Maurice Small, MD  06/16/2022 3:02 PM    [facility]

## 2022-06-16 NOTE — Patient Instructions (Addendum)
Medication Instructions:  Your physician has recommended you make the following change in your medication:  1) START taking flecainide 50 mg twice daily  *If you need a refill on your cardiac medications before your next appointment, please call your pharmacy*   Lab Work: BMET and CBC in one week  Testing/Procedures: Your physician has recommended that you have a Cardioversion (DCCV). Electrical Cardioversion uses a jolt of electricity to your heart either through paddles or wired patches attached to your chest. This is a controlled, usually prescheduled, procedure. Defibrillation is done under light anesthesia in the hospital, and you usually go home the day of the procedure. This is done to get your heart back into a normal rhythm. You are not awake for the procedure. Please see the instruction sheet given to you today.  Follow-Up: At [facility], you and your health needs are our priority.  As part of our continuing mission to provide you with exceptional heart care, we have created designated Provider Care Teams.  These Care Teams include your primary Cardiologist (physician) and Advanced Practice Providers (APPs -  Physician Assistants and Nurse Practitioners) who all work together to provide you with the care you need, when you need it.  Nurse Visit in 1 week for an EKG  Your next appointment:   2 month(s)  Provider:   You may see Maurice Small, MD or one of the following Advanced Practice Providers on your designated Care Team:   Francis Dowse, [place] Casimiro Needle "Mardelle Matte" [place], [place] Sherie Don, NP    Other Instructions

## 2022-06-16 NOTE — H&P (View-Only) (Signed)
Electrophysiology Office Note:    Date:  06/16/2022   ID:  Brandon B Willaims Jr., DOB 11/16/1950, MRN [id number]  PCP:  Jones, Thomas L, MD   [facility] Providers Cardiologist:  Gregg Taylor, MD Electrophysiologist:  Keera Altidor E Anadia Helmes, MD     Referring MD: Jones, Thomas L, MD   History of Present Illness:    Brandon B Siska Jr. is a 72 y.o. male with a hx listed below, significant for persistent atrial fibrillation, AVNRT s/p ablation referred for arrhythmia management.  He underwent an ablation by Dr. Allred for AF in the past. He has also had an ablation for AVNRT by Dr.  Taylor.  He lives in [place].  He notes that he is very easily fatigued and is not nearly as capable as he was prior to recurrence of atrial fibrillation.  He had a repeat A-fib ablation and mapping of the atrium in February 2024.  He was noted to have a small amount of breakthrough at the right anterior veins.  There is also evidence of a prior posterior wall ablation with some breakthrough along the roof line.  200 J cardioversion was unsuccessful.  We were able to return to sinus rhythm with a 360 J shock.  He was seen in follow-up in A-fib clinic and, not surprisingly, had recurrence of atrial fibrillation.  He had a repeat cardioversion in May, but this was unsuccessful.   We had scheduled him for Tikosyn load, but he decided not to proceed due to fear of being in the hospital for days.  Past Medical History:  Diagnosis Date   Anxiety    Arthritis    Benign neoplasm of colon    Benign prostatic hyperplasia with urinary obstruction    Bilateral inguinal hernia 05/29/2011   Bilateral pulmonary embolism (HCC) 3/14   admitted to [facility],  treated with Xarelto   Breast lump 03/29/2019   Calculus of kidney 10/21/2016   Cataract    Chronic bilateral low back pain with sciatica 03/21/2012   Chronic pain    Finger mass, right 10/17/2020   History of alcohol abuse 03/07/2016   History of colon polyps  11/18/2015   History of kidney stones    History of pulmonary embolism 03/19/2012   Hyperlipemia    Hypertension    Insomnia    Insomnia due to medical condition 12/23/2016   Polyuria secondary to BPH   Major depressive disorder, recurrent episode (HCC)    Mitral regurgitation 04/24/2013   Mild by TEE   Morbid obesity (HCC) 03/07/2016   Nonrheumatic mitral valve insufficiency 04/25/2013   Mild by TEE  Overview:  Overview:  Mild by TEE Overview:  Overview:  Overview:  Mild by TEE Overview:  Overview:  Mild by TEE   Obesity    OSA (obstructive sleep apnea)    noncompliant with CPAP.  07/27/13- awaiting a CPAP- unable to tolerate mask   Osteoporosis    Persistent atrial fibrillation (HCC)    Recurrent unilateral inguinal hernia 07/17/2013   Restless leg syndrome    takes gabapentin   S/P right total knee arthroplasty 05/24/2018   Sciatica    Spinal stenosis, lumbar region with neurogenic claudication    SVT (supraventricular tachycardia) 03/07/2016   Thrombus of left atrial appendage 03/09/2013   Uncomplicated asthma 06/20/2008   Overview:  Overview:  Overview:  Overview:  Qualifier: Diagnosis of  By: Todd MD, Jeffrey A Overview:  Overview:  Qualifier: Diagnosis of  By: Todd MD, Jeffrey A     Ventral hernia, unspecified, without mention of obstruction or gangrene    right abdominal wall    Past Surgical History:  Procedure Laterality Date   ATRIAL FIBRILLATION ABLATION N/A 02/27/2022   Procedure: ATRIAL FIBRILLATION ABLATION;  Surgeon: Xaviera Flaten E, MD;  [facility];  Service: Cardiovascular;  Laterality: N/A;   CARDIOVERSION N/A 03/21/2012   Procedure: CARDIOVERSION;  Surgeon: Ajay S Kadakia, MD;  [facility]: [facility];  Service: Cardiovascular;  Laterality: N/A;   CARDIOVERSION N/A 04/24/2013   Procedure: CARDIOVERSION;  Surgeon: Katarina H Nelson, MD;  [facility];  Service: Cardiovascular;  Laterality: N/A;   CARDIOVERSION N/A 06/27/2015   Procedure:  CARDIOVERSION;  Surgeon: Dalton S McLean, MD;  [facility];  Service: Cardiovascular;  Laterality: N/A;   CARDIOVERSION N/A 11/11/2021   Procedure: CARDIOVERSION;  Surgeon: Crenshaw, Brian S, MD;  [facility];  Service: Cardiovascular;  Laterality: N/A;   CARDIOVERSION N/A 05/11/2022   Procedure: CARDIOVERSION;  Surgeon: Chandrasekhar, Mahesh A, MD;  [facility];  Service: Cardiovascular;  Laterality: N/A;   CATARACT EXTRACTION     COLONOSCOPY W/ POLYPECTOMY     ELECTROPHYSIOLOGIC STUDY N/A 07/30/2015   Procedure: Atrial Fibrillation Ablation;  Surgeon: James Allred, MD;  [facility];  Service: Cardiovascular;  Laterality: N/A;   EXTRACORPOREAL SHOCK WAVE LITHOTRIPSY Right 10/29/2016   Procedure: RIGHT EXTRACORPOREAL SHOCK WAVE LITHOTRIPSY (ESWL);  Surgeon: Manny, Theodore, MD;  Location: [facility];  Service: Urology;  Laterality: Right;   EYE SURGERY Right    cataract   FEMUR FRACTURE SURGERY     HERNIA REPAIR  07/06/2011   INGUINAL HERNIA REPAIR Right 07/28/2013   Procedure: RIGHT INGUINAL HERNIA REPAIR;  Surgeon: Matthew K. Tsuei, MD;  [facility];  Service: General;  Laterality: Right;   INGUINAL HERNIA REPAIR Right 09/22/2016   Procedure: LAPAROSCOPIC RIGHT INGUINAL HERNIA;  Surgeon: Kinsinger, Luke Aaron, MD;  Location: [facility];  Service: General;  Laterality: Right;  With MESH   INSERTION OF MESH Right 07/28/2013   Procedure: INSERTION OF MESH;  Surgeon: Matthew K. Tsuei, MD;  [facility];  Service: General;  Laterality: Right;   KNEE ARTHROSCOPY     left   REPLACEMENT TOTAL KNEE Left    ROTATOR CUFF REPAIR     right   ROTATOR CUFF REPAIR Left 03/08/2014   DR SUPPLE   SHOULDER ARTHROSCOPY WITH ROTATOR CUFF REPAIR AND SUBACROMIAL DECOMPRESSION Left 03/08/2014   Procedure: LEFT SHOULDER ARTHROSCOPY WITH ROTATOR CUFF REPAIR/SUBACROMIAL DECOMPRESSION/DISTAL CLAVICLE RESECTION;  Surgeon: Kevin M Supple, MD;  [facility];  Service:  Orthopedics;  Laterality: Left;   SVT ABLATION N/A 01/23/2019   Procedure: SVT ABLATION;  Surgeon: Taylor, Gregg W, MD;  [facility];  Service: Cardiovascular;  Laterality: N/A;   TEE WITHOUT CARDIOVERSION N/A 03/21/2012   Procedure: TRANSESOPHAGEAL ECHOCARDIOGRAM (TEE);  Surgeon: Ajay S Kadakia, MD;  [facility];  Service: Cardiovascular;  Laterality: N/A;   TEE WITHOUT CARDIOVERSION N/A 04/24/2013   Procedure: TRANSESOPHAGEAL ECHOCARDIOGRAM (TEE);  Surgeon: Katarina H Nelson, MD;  [facility];  Service: Cardiovascular;  Laterality: N/A;   TEE WITHOUT CARDIOVERSION N/A 07/29/2015   Procedure: TRANSESOPHAGEAL ECHOCARDIOGRAM (TEE);  Surgeon: Paula V Ross, MD;  [facility];  Service: Cardiovascular;  Laterality: N/A;   TEE WITHOUT CARDIOVERSION N/A 05/11/2022   Procedure: TRANSESOPHAGEAL ECHOCARDIOGRAM;  Surgeon: Chandrasekhar, Mahesh A, MD;  [facility];  Service: Cardiovascular;  Laterality: N/A;     TONSILLECTOMY     TOTAL KNEE ARTHROPLASTY Right 05/24/2018   Procedure: RIGHT TOTAL KNEE ARTHROPLASTY;  Surgeon: Olin, Matthew, MD;  Location: [facility];  Service: Orthopedics;  Laterality: Right;  70 mins    Current Medications: Current Meds  Medication Sig   amLODipine (NORVASC) 5 MG tablet Take 1 tablet (5 mg total) by mouth daily.   atorvastatin (LIPITOR) 40 MG tablet Take 1 tablet (40 mg total) by mouth daily.   carvedilol (COREG) 25 MG tablet TAKE 1 TABLET (25 MG TOTAL) BY MOUTH TWICE A DAY WITH MEALS   dutasteride (AVODART) 0.5 MG capsule Take 0.5 mg by mouth daily.   ELIQUIS 5 MG TABS tablet TAKE 1 TABLET BY MOUTH TWICE A DAY   escitalopram (LEXAPRO) 20 MG tablet TAKE 1 TABLET BY MOUTH EVERY DAY   fluticasone (FLONASE) 50 MCG/ACT nasal spray Place 2 sprays into both nostrils daily. (Patient taking differently: Place 2 sprays into both nostrils daily as needed for allergies.)   gabapentin (NEURONTIN) 800 MG tablet Take 1 tablet (800  mg total) by mouth 3 (three) times daily.   memantine (NAMENDA) 10 MG tablet Take 1 tablet (10 mg total) by mouth 2 (two) times daily.   potassium chloride SA (KLOR-CON M20) 20 MEQ tablet TAKE 1 TABLET BY MOUTH EVERY DAY   spironolactone (ALDACTONE) 25 MG tablet TAKE 1 TABLET (25 MG TOTAL) BY MOUTH DAILY.   tamsulosin (FLOMAX) 0.4 MG CAPS capsule TAKE 1 CAPSULE BY MOUTH 2 TIMES DAILY.   triamcinolone cream (KENALOG) 0.1 % Apply 1 Application topically 2 (two) times daily as needed (rash).     Allergies:   Ace inhibitors and Albuterol   Social History   Socioeconomic History   Marital status: Married    Spouse name: Not on file   Number of children: 2   Years of education: college   Highest education level: Bachelor's degree (e.g., BA, AB, BS)  Occupational History   Not on file  Tobacco Use   Smoking status: Former    Packs/day: 0.50    Years: 5.00    Additional pack years: 0.00    Total pack years: 2.50    Types: Cigarettes    Quit date: 1976    Years since quitting: 48.4   Smokeless tobacco: Never   Tobacco comments:    Former smoker 03/27/22  Vaping Use   Vaping Use: Never used  Substance and Sexual Activity   Alcohol use: No    Comment: Former EtOH abuse, stopped 06/2016   Drug use: No    Comment: negative hx for IV drug abuse   Sexual activity: Not on file  Other Topics Concern   Not on file  Social History Narrative   Lives with wife in    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   Prior Military   Wife lives in town (still married but lives separate)   Has biological daughter in [place] (Mary Sato)   Social Determinants of Health   Financial Resource Strain: Low Risk  (06/09/2022)   Overall Financial Resource Strain (CARDIA)    Difficulty of Paying Living Expenses: Not hard at all  Food Insecurity: No Food Insecurity (06/09/2022)   Hunger Vital Sign    Worried About Running Out of Food in the Last Year: Never true    Ran Out of Food in the Last Year: Never true   Transportation Needs: No Transportation Needs (06/09/2022)   PRAPARE - Transportation    Lack of Transportation (Medical): No    Lack   of Transportation (Non-Medical): No  Physical Activity: Inactive (06/09/2022)   Exercise Vital Sign    Days of Exercise per Week: 0 days    Minutes of Exercise per Session: 0 min  Stress: Stress Concern Present (06/09/2022)   [employer] - Occupational Stress Questionnaire    Feeling of Stress : Rather much  Social Connections: Socially Isolated (06/09/2022)   Social Connection and Isolation Panel [NHANES]    Frequency of Communication with Friends and Family: Once a week    Frequency of Social Gatherings with Friends and Family: Never    Attends Religious Services: Never    Active Member of Clubs or Organizations: No    Attends Club or Organization Meetings: 1 to 4 times per year    Marital Status: Separated     Family History: The patient's family history includes Asthma in his mother; CVA in an other family member; Cancer in his father and paternal grandmother; Diabetes in an other family member; Heart failure in his mother; Hypertension in his mother.  ROS:   Please see the history of present illness.    All other systems reviewed and are negative.  EKGs/Labs/Other Studies Reviewed Today:      EKG:  Last EKG results: today - AF, V-rate 73 bpm  TTE: severe LAE   Recent Labs: 10/19/2021: Magnesium 1.8 06/09/2022: ALT 11; BUN 12; Creatinine, Ser 0.94; Hemoglobin 14.7; Platelets 182.0; Potassium 4.0; Sodium 140; TSH 1.85     Physical Exam:    VS:  BP 120/76   Pulse 73   Ht 6' 4" (1.93 m)   Wt 282 lb (127.9 kg)   SpO2 96%   BMI 34.33 kg/m     Wt Readings from Last 3 Encounters:  06/16/22 282 lb (127.9 kg)  06/09/22 276 lb (125.2 kg)  06/02/22 280 lb (127 kg)     GEN: Well nourished, well developed in no acute distress CARDIAC: Irregular rhythm, no murmurs, rubs, gallops RESPIRATORY:  Normal work of  breathing MUSCULOSKELETAL: no edema    ASSESSMENT & PLAN:    Persistent AF: Status post ablation by Dr. Allred, repeat ablation by me.  Very symptomatic. His repeat ablation did not show many targets for ablation. He has, not surprisingly, had recurrence and is very symptomatic. He does not want to be hospitalized for Tikosyn load. PR is about 200 on ECG, but heart is structurally normal. We discussed flecainide and amiodarone - the relative risks and benefits of both medications. Taking a shared decision approach, he would like to try flecainide. He is aware that there is potential for life threatening arrhythmia though the chance of this is  very low. Start flecainide 50mg PO BID Schedule for DCCV -- he missed eliquis last night. Will need to schedule him out 3 weeks.    AVNRT: s/p ablation OSA: will need to be compliant with CPAP to reduce risk of AF recurrence        Medication Adjustments/Labs and Tests Ordered: Current medicines are reviewed at length with the patient today.  Concerns regarding medicines are outlined above.  No orders of the defined types were placed in this encounter.  No orders of the defined types were placed in this encounter.    Signed, Willys Salvino E Lummie Montijo, MD  06/16/2022 3:02 PM    [facility] 

## 2022-06-24 ENCOUNTER — Ambulatory Visit: Payer: Medicare Other

## 2022-06-24 ENCOUNTER — Ambulatory Visit: Payer: Medicare Other | Attending: Internal Medicine

## 2022-06-24 VITALS — HR 68 | Ht 76.0 in | Wt 279.0 lb

## 2022-06-24 DIAGNOSIS — Z79899 Other long term (current) drug therapy: Secondary | ICD-10-CM | POA: Insufficient documentation

## 2022-06-24 DIAGNOSIS — I4819 Other persistent atrial fibrillation: Secondary | ICD-10-CM

## 2022-06-24 LAB — BASIC METABOLIC PANEL
BUN/Creatinine Ratio: 20 (ref 10–24)
BUN: 19 mg/dL (ref 8–27)
CO2: 28 mmol/L (ref 20–29)
Calcium: 9 mg/dL (ref 8.6–10.2)
Chloride: 105 mmol/L (ref 96–106)
Creatinine, Ser: 0.95 mg/dL (ref 0.76–1.27)
Glucose: 88 mg/dL (ref 70–99)
Potassium: 4.3 mmol/L (ref 3.5–5.2)
Sodium: 142 mmol/L (ref 134–144)
eGFR: 84 mL/min/{1.73_m2} (ref >59)

## 2022-06-24 LAB — CBC
Hematocrit: 41.8 % (ref 37.5–51.0)
Hemoglobin: 14.1 g/dL (ref 13.0–17.7)
MCH: 30.6 pg (ref 26.6–33.0)
MCHC: 33.7 g/dL (ref 31.5–35.7)
MCV: 91 fL (ref 79–97)
Platelets: 159 10*3/uL (ref 150–450)
RBC: 4.61 x10E6/uL (ref 4.14–5.80)
RDW: 14.2 % (ref 11.6–15.4)
WBC: 8.5 10*3/uL (ref 3.4–10.8)

## 2022-06-24 NOTE — Progress Notes (Signed)
   Nurse Visit   Date of Encounter: 06/24/2022 ID: Lenden Sirbaugh., DOB March 02, 1950, MRN [id number]  PCP:  Etta Grandchild, MD   [facility] Providers Cardiologist:  Lewayne Bunting, MD Electrophysiologist:  Maurice Small, MD      Visit Details   VS:  Pulse 68   Ht 6\' 4"  (1.93 m)   Wt 279 lb (126.6 kg)   BMI 33.96 kg/m  , BMI Body mass index is 33.96 kg/m.  Wt Readings from Last 3 Encounters:  06/24/22 279 lb (126.6 kg)  06/16/22 282 lb (127.9 kg)  06/09/22 276 lb (125.2 kg)     Reason for visit: EKG after starting Flecainide Performed today: Vitals, EKG, Provider consulted:Dr. Sherryl Manges (DOD), and Education Changes (medications, testing, etc.) : None Length of Visit: 40 minutes    Medications Adjustments/Labs and Tests Ordered: Orders Placed This Encounter  Procedures   EKG 12-Lead   EKG 12-Lead   No orders of the defined types were placed in this encounter.  Patient is here today for follow-up EKG after starting Flecainide 50mg  BID a week ago. He reports feeling dizzy for the first 2 days starting this medication, which resolved and he has had no further issues. Denies any chest pain or SOB. EKG obtained and reviewed by DOD Dr. Graciela Husbands: QRS unchanged. Continue with current plan. Patient is scheduled for a cardioversion on 07/13/2022. CBC, BMET drawn today prior to nurse visit.  Signed, Franchot Gallo, RN  06/24/2022 11:54 AM

## 2022-06-24 NOTE — Patient Instructions (Signed)
Medication Instructions:  Your physician recommends that you continue on your current medications as directed. Please refer to the Current Medication list given to you today.  *If you need a refill on your cardiac medications before your next appointment, please call your pharmacy*  Lab Work: TODAY: CBC, BMET If you have labs (blood work) drawn today and your tests are completely normal, you will receive your results only by: MyChart Message (if you have MyChart) OR A paper copy in the mail If you have any lab test that is abnormal or we need to change your treatment, we will call you to review the results.  Testing/Procedures: You are scheduled for a Cardioversion (DCCV) on 07/13/2022. Electrical Cardioversion uses a jolt of electricity to your heart either through paddles or wired patches attached to your chest. This is a controlled, usually prescheduled, procedure. Defibrillation is done under light anesthesia in the hospital, and you usually go home the day of the procedure. This is done to get your heart back into a normal rhythm. You are not awake for the procedure. Please see the instruction sheet given to you today.   Follow-Up: At [facility], you and your health needs are our priority.  As part of our continuing mission to provide you with exceptional heart care, we have created designated Provider Care Teams.  These Care Teams include your primary Cardiologist (physician) and Advanced Practice Providers (APPs -  Physician Assistants and Nurse Practitioners) who all work together to provide you with the care you need, when you need it.  Your next appointment:   2 month(s)  The format for your next appointment:   In Person  Provider:   You may see Maurice Small, MD or one of the following Advanced Practice Providers on your designated Care Team:   Francis Dowse, [place] Casimiro Needle "[place]" [place], PA-C [place], [place]

## 2022-07-10 NOTE — Pre-Procedure Instructions (Signed)
Spoke to patient on phone regarding cardioversion on Monday, July 8th.  Instructed to arrive at 6:30 am, NPO after midnight.  Confirmed patient has a ride home and responsible person to stay with patient for 24 hours after the procedure  Confirmed no missed doses of Eliquis, instructed patient to take morning of surgery with sip of water.  Take BP meds in the AM with a sip of water

## 2022-07-12 NOTE — Anesthesia Preprocedure Evaluation (Signed)
Anesthesia Evaluation  Patient identified by MRN, date of birth, ID band Patient awake    Reviewed: Allergy & Precautions, NPO status , Patient's Chart, lab work & pertinent test results, reviewed documented beta blocker date and time   History of Anesthesia Complications Negative for: history of anesthetic complications  Airway Mallampati: II  TM Distance: >3 FB Neck ROM: Full    Dental  (+) Dental Advisory Given   Pulmonary asthma , sleep apnea , former smoker, PE   Pulmonary exam normal        Cardiovascular hypertension, Pt. on home beta blockers and Pt. on medications + dysrhythmias Atrial Fibrillation and Supra Ventricular Tachycardia + Valvular Problems/Murmurs MR  Rhythm:Irregular Rate:Normal   '24 TEE - EF 55 to 60%. Left atrial size was severely dilated. Right atrial size was mildly dilated. Moderate MR. Trivial AI.There is mild dilatation of the aortic root, measuring 43 mm. There is mild (Grade II) plaque involving the descending aorta. Evidence of atrial level shunting detected by color flow Doppler.       Neuro/Psych  PSYCHIATRIC DISORDERS Anxiety Depression     Neuromuscular disease    GI/Hepatic negative GI ROS,,,(+)     substance abuse  alcohol use  Endo/Other   Obesity   Renal/GU negative Renal ROS     Musculoskeletal  (+) Arthritis ,    Abdominal   Peds  Hematology  On eliquis    Anesthesia Other Findings   Reproductive/Obstetrics                             Anesthesia Physical Anesthesia Plan  ASA: 3  Anesthesia Plan: General   Post-op Pain Management: Minimal or no pain anticipated   Induction: Intravenous  PONV Risk Score and Plan: 2 and Treatment may vary due to age or medical condition and Propofol infusion  Airway Management Planned: Natural Airway and Mask  Additional Equipment: None  Intra-op Plan:   Post-operative Plan:   Informed Consent:  I have reviewed the patients History and Physical, chart, labs and discussed the procedure including the risks, benefits and alternatives for the proposed anesthesia with the patient or authorized representative who has indicated his/her understanding and acceptance.       Plan Discussed with: CRNA and Anesthesiologist  Anesthesia Plan Comments:        Anesthesia Quick Evaluation

## 2022-07-13 ENCOUNTER — Ambulatory Visit (HOSPITAL_COMMUNITY): Payer: Medicare Other | Admitting: Anesthesiology

## 2022-07-13 ENCOUNTER — Other Ambulatory Visit: Payer: Self-pay

## 2022-07-13 ENCOUNTER — Ambulatory Visit (HOSPITAL_BASED_OUTPATIENT_CLINIC_OR_DEPARTMENT_OTHER): Payer: Medicare Other | Admitting: Anesthesiology

## 2022-07-13 ENCOUNTER — Ambulatory Visit (HOSPITAL_COMMUNITY)
Admission: RE | Admit: 2022-07-13 | Discharge: 2022-07-13 | Disposition: A | Discharge status: Home / Self Care | Payer: Medicare Other | Source: Ambulatory Visit | Attending: Cardiovascular Disease | Admitting: Cardiovascular Disease

## 2022-07-13 ENCOUNTER — Encounter (HOSPITAL_COMMUNITY)
Admission: RE | Disposition: A | Discharge status: Home / Self Care | Payer: Self-pay | Source: Ambulatory Visit | Attending: Cardiovascular Disease

## 2022-07-13 DIAGNOSIS — Z87891 Personal history of nicotine dependence: Secondary | ICD-10-CM | POA: Diagnosis not present

## 2022-07-13 DIAGNOSIS — Z01818 Encounter for other preprocedural examination: Secondary | ICD-10-CM

## 2022-07-13 DIAGNOSIS — I4819 Other persistent atrial fibrillation: Secondary | ICD-10-CM | POA: Insufficient documentation

## 2022-07-13 DIAGNOSIS — I4891 Unspecified atrial fibrillation: Secondary | ICD-10-CM | POA: Diagnosis not present

## 2022-07-13 DIAGNOSIS — I4719 Other supraventricular tachycardia: Secondary | ICD-10-CM | POA: Insufficient documentation

## 2022-07-13 DIAGNOSIS — I10 Essential (primary) hypertension: Secondary | ICD-10-CM

## 2022-07-13 DIAGNOSIS — G4733 Obstructive sleep apnea (adult) (pediatric): Secondary | ICD-10-CM | POA: Diagnosis not present

## 2022-07-13 DIAGNOSIS — R10817 Generalized abdominal tenderness: Secondary | ICD-10-CM

## 2022-07-13 DIAGNOSIS — E785 Hyperlipidemia, unspecified: Secondary | ICD-10-CM | POA: Diagnosis not present

## 2022-07-13 DIAGNOSIS — Z7901 Long term (current) use of anticoagulants: Secondary | ICD-10-CM | POA: Insufficient documentation

## 2022-07-13 DIAGNOSIS — J45909 Unspecified asthma, uncomplicated: Secondary | ICD-10-CM | POA: Diagnosis not present

## 2022-07-13 DIAGNOSIS — I480 Paroxysmal atrial fibrillation: Secondary | ICD-10-CM

## 2022-07-13 HISTORY — PX: CARDIOVERSION: SHX1299

## 2022-07-13 SURGERY — CARDIOVERSION
Anesthesia: General

## 2022-07-13 MED ORDER — LIDOCAINE 2% (20 MG/ML) 5 ML SYRINGE
INTRAMUSCULAR | Status: DC | PRN
  Administered 2022-07-13: 60 mg via INTRAVENOUS

## 2022-07-13 MED ORDER — PROPOFOL 10 MG/ML IV BOLUS
INTRAVENOUS | Status: DC | PRN
  Administered 2022-07-13: 70 mg via INTRAVENOUS

## 2022-07-13 MED ORDER — SODIUM CHLORIDE 0.9 % IV SOLN: INTRAVENOUS | Status: DC

## 2022-07-13 SURGICAL SUPPLY — 1 items: ELECT DEFIB PAD ADLT CADENCE (PAD) ×1 IMPLANT

## 2022-07-13 NOTE — Interval H&P Note (Signed)
History and Physical Interval Note:  07/13/2022 7:44 AM  Brandon B Hollingshed Jr.  has presented today for surgery, with the diagnosis of afib.  The various methods of treatment have been discussed with the patient and family. After consideration of risks, benefits and other options for treatment, the patient has consented to  Procedure(s): CARDIOVERSION (N/A) as a surgical intervention.  The patient's history has been reviewed, patient examined, no change in status, stable for surgery.  I have reviewed the patient's chart and labs.  Questions were answered to the patient's satisfaction.     Catherina Pates

## 2022-07-13 NOTE — Op Note (Signed)
Procedure: Electrical Cardioversion Indications:  Atrial Fibrillation  Procedure Details:  Consent: Risks of procedure as well as the alternatives and risks of each were explained to the (patient/caregiver).  Consent for procedure obtained.  Time Out: Verified patient identification, verified procedure, site/side was marked, verified correct patient position, special equipment/implants available, medications/allergies/relevent history reviewed, required imaging and test results available.  Performed  Patient placed on cardiac monitor, pulse oximetry, supplemental oxygen as necessary.  Sedation given:  propofol 70 mg IV, Dr. Mal Amabile Pacer pads placed anterior and posterior chest.  Cardioverted 2 time(s).  Cardioversion with synchronized biphasic 120J shock (unsuccessful) and 200J shock (successful).  Evaluation: Findings: Post procedure EKG shows: NSR Complications: None Patient did tolerate procedure well.  Time Spent Directly with the Patient:  30 minutes   Brandon [name] 07/13/2022, 7:51 AM

## 2022-07-13 NOTE — Transfer of Care (Signed)
Immediate Anesthesia Transfer of Care Note  Patient: Brandon [name].  Procedure(s) Performed: CARDIOVERSION  Patient Location: Cath Lab  Anesthesia Type:General  Level of Consciousness: awake, drowsy, and patient cooperative  Airway & Oxygen Therapy: Patient Spontanous Breathing and Patient connected to nasal cannula oxygen  Post-op Assessment: Report given to RN and Post -op Vital signs reviewed and stable  Post vital signs: Reviewed and stable  Last Vitals:  Vitals Value Taken Time  BP    Temp    Pulse    Resp    SpO2      Last Pain:  Vitals:   07/13/22 0654  TempSrc: Temporal  PainSc: 0-No pain         Complications: No notable events documented.

## 2022-07-13 NOTE — Anesthesia Postprocedure Evaluation (Signed)
Anesthesia Post Note  Patient: Brandon [name].  Procedure(s) Performed: CARDIOVERSION     Patient location during evaluation: PACU Anesthesia Type: General Level of consciousness: awake and alert Pain management: pain level controlled Vital Signs Assessment: post-procedure vital signs reviewed and stable Respiratory status: spontaneous breathing, nonlabored ventilation and respiratory function stable Cardiovascular status: stable and blood pressure returned to baseline Anesthetic complications: no   No notable events documented.  Last Vitals:  Vitals:   07/13/22 0810 07/13/22 0825  BP: 102/73 103/88  Pulse: 61 65  Resp: 11 (!) 23  Temp:    SpO2: 95% 96%    Last Pain:  Vitals:   07/13/22 0754  TempSrc: Temporal  PainSc: 0-No pain                 Beryle Lathe

## 2022-07-14 ENCOUNTER — Encounter (HOSPITAL_COMMUNITY): Payer: Self-pay | Admitting: Cardiovascular Disease

## 2022-07-23 ENCOUNTER — Other Ambulatory Visit: Payer: Self-pay | Admitting: Internal Medicine

## 2022-07-23 DIAGNOSIS — N401 Enlarged prostate with lower urinary tract symptoms: Secondary | ICD-10-CM

## 2022-07-23 MED ORDER — TAMSULOSIN HCL 0.4 MG PO CAPS: 0.4000 mg | ORAL_CAPSULE | Freq: Two times a day (BID) | ORAL | 0 refills | Status: DC

## 2022-07-24 ENCOUNTER — Other Ambulatory Visit: Payer: Self-pay | Admitting: Internal Medicine

## 2022-08-02 ENCOUNTER — Other Ambulatory Visit: Payer: Self-pay | Admitting: Internal Medicine

## 2022-08-02 DIAGNOSIS — E876 Hypokalemia: Secondary | ICD-10-CM

## 2022-08-02 DIAGNOSIS — E269 Hyperaldosteronism, unspecified: Secondary | ICD-10-CM

## 2022-08-03 ENCOUNTER — Other Ambulatory Visit: Payer: Self-pay | Admitting: Internal Medicine

## 2022-08-03 ENCOUNTER — Encounter: Payer: Self-pay | Admitting: Internal Medicine

## 2022-08-03 DIAGNOSIS — N401 Enlarged prostate with lower urinary tract symptoms: Secondary | ICD-10-CM

## 2022-08-03 DIAGNOSIS — R4189 Other symptoms and signs involving cognitive functions and awareness: Secondary | ICD-10-CM

## 2022-08-03 DIAGNOSIS — E876 Hypokalemia: Secondary | ICD-10-CM

## 2022-08-03 DIAGNOSIS — E269 Hyperaldosteronism, unspecified: Secondary | ICD-10-CM

## 2022-08-03 MED ORDER — SPIRONOLACTONE 25 MG PO TABS: 25.0000 mg | ORAL_TABLET | Freq: Every day | ORAL | 0 refills | Status: DC

## 2022-08-07 ENCOUNTER — Other Ambulatory Visit: Payer: Self-pay | Admitting: Internal Medicine

## 2022-08-07 DIAGNOSIS — E876 Hypokalemia: Secondary | ICD-10-CM

## 2022-08-07 DIAGNOSIS — E269 Hyperaldosteronism, unspecified: Secondary | ICD-10-CM

## 2022-08-07 DIAGNOSIS — R4189 Other symptoms and signs involving cognitive functions and awareness: Secondary | ICD-10-CM

## 2022-08-07 MED ORDER — MEMANTINE HCL 10 MG PO TABS
10.0000 mg | ORAL_TABLET | Freq: Two times a day (BID) | ORAL | 0 refills | Status: DC
Start: 2022-08-07 — End: 2022-08-18

## 2022-08-07 MED ORDER — SPIRONOLACTONE 25 MG PO TABS: 25.0000 mg | ORAL_TABLET | Freq: Every day | ORAL | 0 refills | Status: DC

## 2022-08-10 ENCOUNTER — Other Ambulatory Visit: Payer: Self-pay | Admitting: Internal Medicine

## 2022-08-10 DIAGNOSIS — M48062 Spinal stenosis, lumbar region with neurogenic claudication: Secondary | ICD-10-CM

## 2022-08-13 ENCOUNTER — Encounter: Payer: Self-pay | Admitting: Physical Medicine & Rehabilitation

## 2022-08-18 ENCOUNTER — Other Ambulatory Visit: Payer: Self-pay | Admitting: Internal Medicine

## 2022-08-18 DIAGNOSIS — R4189 Other symptoms and signs involving cognitive functions and awareness: Secondary | ICD-10-CM

## 2022-08-18 DIAGNOSIS — N401 Enlarged prostate with lower urinary tract symptoms: Secondary | ICD-10-CM

## 2022-08-24 ENCOUNTER — Other Ambulatory Visit: Payer: Self-pay | Admitting: Internal Medicine

## 2022-08-24 DIAGNOSIS — N401 Enlarged prostate with lower urinary tract symptoms: Secondary | ICD-10-CM

## 2022-08-24 DIAGNOSIS — R4189 Other symptoms and signs involving cognitive functions and awareness: Secondary | ICD-10-CM

## 2022-08-24 DIAGNOSIS — E876 Hypokalemia: Secondary | ICD-10-CM

## 2022-08-24 DIAGNOSIS — E269 Hyperaldosteronism, unspecified: Secondary | ICD-10-CM

## 2022-09-03 ENCOUNTER — Other Ambulatory Visit: Payer: Self-pay | Admitting: Oncology

## 2022-09-03 DIAGNOSIS — Z006 Encounter for examination for normal comparison and control in clinical research program: Secondary | ICD-10-CM

## 2022-09-07 ENCOUNTER — Other Ambulatory Visit: Payer: Self-pay | Admitting: Internal Medicine

## 2022-09-07 DIAGNOSIS — E785 Hyperlipidemia, unspecified: Secondary | ICD-10-CM

## 2022-09-14 ENCOUNTER — Ambulatory Visit: Payer: Medicare Other | Admitting: Cardiovascular Disease

## 2022-09-22 ENCOUNTER — Encounter: Payer: Medicare Other | Attending: Physical Medicine & Rehabilitation | Admitting: Physical Medicine & Rehabilitation

## 2022-09-22 ENCOUNTER — Encounter: Payer: Self-pay | Admitting: Physical Medicine & Rehabilitation

## 2022-09-22 VITALS — BP 111/75 | HR 64 | Ht 76.0 in | Wt 282.0 lb

## 2022-09-22 DIAGNOSIS — M545 Low back pain, unspecified: Secondary | ICD-10-CM | POA: Insufficient documentation

## 2022-09-22 DIAGNOSIS — M48061 Spinal stenosis, lumbar region without neurogenic claudication: Secondary | ICD-10-CM | POA: Insufficient documentation

## 2022-09-22 DIAGNOSIS — M47816 Spondylosis without myelopathy or radiculopathy, lumbar region: Secondary | ICD-10-CM | POA: Diagnosis not present

## 2022-09-22 DIAGNOSIS — G8929 Other chronic pain: Secondary | ICD-10-CM | POA: Insufficient documentation

## 2022-09-22 MED ORDER — PREGABALIN 100 MG PO CAPS: 100.0000 mg | ORAL_CAPSULE | Freq: Three times a day (TID) | ORAL | 2 refills | Status: DC

## 2022-09-22 NOTE — Progress Notes (Signed)
Subjective:    Patient ID: Brandon Muller., male    DOB: 06-24-1950, 72 y.o.   MRN: [id number]  HPI   HPI  Brandon Sivers. is a 72 y.o. year old male  who  has a past medical history of Anxiety, Arthritis, Benign neoplasm of colon, Benign prostatic hyperplasia with urinary obstruction, Bilateral inguinal hernia (05/29/2011), Bilateral pulmonary embolism (HCC) (3/14), Breast lump (03/29/2019), Calculus of kidney (10/21/2016), Cataract, Chronic bilateral low back pain with sciatica (03/21/2012), Chronic pain, Finger mass, right (10/17/2020), History of alcohol abuse (03/07/2016), History of colon polyps (11/18/2015), History of kidney stones, History of pulmonary embolism (03/19/2012), Hyperlipemia, Hypertension, Insomnia, Insomnia due to medical condition (12/23/2016), Major depressive disorder, recurrent episode (HCC), Mitral regurgitation (04/24/2013), Morbid obesity (HCC) (03/07/2016), Nonrheumatic mitral valve insufficiency (04/25/2013), Obesity, OSA (obstructive sleep apnea), Osteoporosis, Persistent atrial fibrillation (HCC), Recurrent unilateral inguinal hernia (07/17/2013), Restless leg syndrome, S/P right total knee arthroplasty (05/24/2018), Sciatica, Spinal stenosis, lumbar region with neurogenic claudication, SVT (supraventricular tachycardia) (03/07/2016), Thrombus of left atrial appendage (03/09/2013), Uncomplicated asthma (06/20/2008), and Ventral hernia, unspecified, without mention of obstruction or gangrene.   They are presenting to PM&R clinic as a new patient for pain management evaluation. They were referred by Dr. Yetta Barre for treatment of lumbar spinal stenosis pain.  Brandon [name] reports progressive worsening pain in his lower back that radiates to his outer and inner thighs.  He says this pain has been worsening gradually over many years.  Pain is worsened with standing up and walking.  Pain has a squeezing quality and he says the pain worsens with each step when he is walking.  Pain is improved with  bending forward or sitting down.  He reports having a history of scoliosis.  He was previously followed by [facility].  Patient was also seen for several years by Dr. Alvester Morin.  He had a trial of spinal cord stimulator but appears it was not beneficial enough to where he wanted to have it implanted.   Patient reports multiple spinal injections by Dr. Alvester Morin and he thinks that injections that he had reduced his pain from several weeks.  He is not sure which injections ESI versus facet joint ablation provided him with the best relief.  Chart review indicates he had radiofrequency ablation of left L3-4, L4-5 and L5-S1 facet joints by Dr. Alvester Morin after having a double diagnostic medial branch block with more than 50% relief.  It does not appear he had a lot of relief from the RFA.  More recently he was seen at [facility] neurosurgery.  Patient reports neurosurgery suggested he do injections again and he wanted to do them in [place] versus doing them at [facility] due to distance.  Chart review indicates neurosurgery feels that possibly upper lumbar facet joints could be contributing to his pain and they suggested upper lumbar MBB and possible RFA.  Patient reports having leg length discrepancy and has a lift in his left shoe.  Patient reports he tries to stay active and tries to exercise in a pool when he can.  He used to like to swim when he was younger.  He uses a cane for balance but does not want to use a walker.  Red flag symptoms: No red flags for back pain endorsed in Hx or ROS  Medications tried: Topical medications denies , tried CBD ointment in the past  Nsaids - limited by Afib  Tylenol  - Helps a little Opiates  Hydrocodone and oxycodone- help a little, helps  him.  He would like to use this only intermittently when his pain is very severe during acute exacerbations Gabapentin /- Takes TID 800mg   TCAs  -denies  SNRIs  -does not recall  Other treatments: PT- helps while he is getting it but  results havent lasted  TENs unit -denies benefit Injections -ESI, RFA, spinal cord stimulator trial in past by Dr. Alvester Morin  Surgery denies prior spine surgery  Goals for pain management: To be more active and have decreased pain  Prior UDS results:     Component Value Date/Time   LABOPIA NEGATIVE 03/17/2012 0133   COCAINSCRNUR NEGATIVE 03/17/2012 0133   LABBENZ POSITIVE (A) 03/17/2012 0133   AMPHETMU NEGATIVE 03/17/2012 0133       Pain Inventory Average Pain 9 Pain Right Now 8 My pain is aching  In the last 24 hours, has pain interfered with the following? General activity 9 Relation with others 8 Enjoyment of life 9 What TIME of day is your pain at its worst? daytime Sleep (in general) Poor  Pain is worse with: walking, standing, and some activites Pain improves with: medication Relief from Meds: 7  use a cane how many minutes can you walk? 3 ability to climb steps?  yes do you drive?  yes Do you have any goals in this area?  yes  retired Do you have any goals in this area?  yes  bladder control problems weakness trouble walking depression suicidal thoughts  Any changes since last visit?  no  Any changes since last visit?  no    Family History  Problem Relation Age of Onset   Cancer Father        bone   Heart failure Mother    Hypertension Mother    Asthma Mother    Cancer Paternal Grandmother        ovarian   CVA Other        Fam Hx of multiple myeloma   Diabetes Other        Fam Hx of DM   Social History   Socioeconomic History   Marital status: Married    Spouse name: Not on file   Number of children: 2   Years of education: college   Highest education level: Bachelor's degree (e.g., BA, AB, BS)  Occupational History   Not on file  Tobacco Use   Smoking status: Former    Current packs/day: 0.00    Average packs/day: 0.5 packs/day for 5.0 years (2.5 ttl pk-yrs)    Types: Cigarettes    Start date: 13    Quit date: 1976    Years  since quitting: 48.7   Smokeless tobacco: Never   Tobacco comments:    Former smoker 03/27/22  Vaping Use   Vaping status: Never Used  Substance and Sexual Activity   Alcohol use: No    Comment: Former EtOH abuse, stopped 06/2016   Drug use: No    Comment: negative hx for IV drug abuse   Sexual activity: Not on file  Other Topics Concern   Not on file  Social History Narrative   Lives with wife in [place]   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   Prior Hotel manager   Wife lives in town (still married but lives separate)   Has biological daughter in [place] ([place])   Social Determinants of Health   Financial Resource Strain: Low Risk  (06/09/2022)   Overall Financial Resource Strain (CARDIA)    Difficulty of Paying Living Expenses: Not hard at  all  Food Insecurity: No Food Insecurity (06/09/2022)   Hunger Vital Sign    Worried About Running Out of Food in the Last Year: Never true    Ran Out of Food in the Last Year: Never true  Transportation Needs: No Transportation Needs (06/09/2022)   PRAPARE - Administrator, Civil Service (Medical): No    Lack of Transportation (Non-Medical): No  Physical Activity: Inactive (06/09/2022)   Exercise Vital Sign    Days of Exercise per Week: 0 days    Minutes of Exercise per Session: 0 min  Stress: Stress Concern Present (06/09/2022)   [employer] of Occupational Health - Occupational Stress Questionnaire    Feeling of Stress : Rather much  Social Connections: Socially Isolated (06/09/2022)   Social Connection and Isolation Panel [NHANES]    Frequency of Communication with Friends and Family: Once a week    Frequency of Social Gatherings with Friends and Family: Never    Attends Religious Services: Never    Database administrator or Organizations: No    Attends Engineer, structural: 1 to 4 times per year    Marital Status: Separated   Past Surgical History:  Procedure Laterality Date   ATRIAL FIBRILLATION ABLATION N/A 02/27/2022    Procedure: ATRIAL FIBRILLATION ABLATION;  Surgeon: Nelly Laurence, Roberts Gaudy, MD;  [facility]: [facility] INVAS[facility];  Service: Cardiovascular;  Laterality: N/A;   CARDIOVERSION N/A 03/21/2012   Procedure: CARDIOVERSION;  Surgeon: Ricki Rodriguez, MD;  Location: [facility] ENDOSCOPY;  Service: Cardiovascular;  Laterality: N/A;   CARDIOVERSION N/A 04/24/2013   Procedure: CARDIOVERSION;  Surgeon: Lars Masson, MD;  Location: [facility] ENDOSCOPY;  Service: Cardiovascular;  Laterality: N/A;   CARDIOVERSION N/A 06/27/2015   Procedure: CARDIOVERSION;  Surgeon: Laurey Morale, MD;  Location: [facility] ENDOSCOPY;  Service: Cardiovascular;  Laterality: N/A;   CARDIOVERSION N/A 11/11/2021   Procedure: CARDIOVERSION;  Surgeon: Lewayne Bunting, MD;  Location: [facility] ENDOSCOPY;  Service: Cardiovascular;  Laterality: N/A;   CARDIOVERSION N/A 05/11/2022   Procedure: CARDIOVERSION;  Surgeon: Christell Constant, MD;  [facility]: [facility];  Service: Cardiovascular;  Laterality: N/A;   CARDIOVERSION N/A 07/13/2022   Procedure: CARDIOVERSION;  Surgeon: Thurmon Fair, MD;  [facility]: [facility];  Service: Cardiovascular;  Laterality: N/A;   CATARACT EXTRACTION     COLONOSCOPY W/ POLYPECTOMY     ELECTROPHYSIOLOGIC STUDY N/A 07/30/2015   Procedure: Atrial Fibrillation Ablation;  Surgeon: Hillis Range, MD;  Location: [facility] INVASIVE CV LAB;  Service: Cardiovascular;  Laterality: N/A;   EXTRACORPOREAL SHOCK WAVE LITHOTRIPSY Right 10/29/2016   Procedure: RIGHT EXTRACORPOREAL SHOCK WAVE LITHOTRIPSY (ESWL);  Surgeon: Sebastian Ache, MD;  Location: [facility] ORS;  Service: Urology;  Laterality: Right;   EYE SURGERY Right    cataract   FEMUR FRACTURE SURGERY     HERNIA REPAIR  07/06/2011   INGUINAL HERNIA REPAIR Right 07/28/2013   Procedure: RIGHT INGUINAL HERNIA REPAIR;  Surgeon: Wilmon Arms. Corliss Skains, MD;  Location: [facility] OR;  Service: General;  Laterality: Right;   INGUINAL HERNIA REPAIR Right 09/22/2016   Procedure: LAPAROSCOPIC RIGHT INGUINAL HERNIA;   Surgeon: Sheliah Hatch De Blanch, MD;  Location: [facility] ORS;  Service: General;  Laterality: Right;  With MESH   INSERTION OF MESH Right 07/28/2013   Procedure: INSERTION OF MESH;  Surgeon: Wilmon Arms. Corliss Skains, MD;  Location: [facility] OR;  Service: General;  Laterality: Right;   KNEE ARTHROSCOPY     left   REPLACEMENT TOTAL KNEE Left  ROTATOR CUFF REPAIR     right   ROTATOR CUFF REPAIR Left 03/08/2014   DR SUPPLE   SHOULDER ARTHROSCOPY WITH ROTATOR CUFF REPAIR AND SUBACROMIAL DECOMPRESSION Left 03/08/2014   Procedure: LEFT SHOULDER ARTHROSCOPY WITH ROTATOR CUFF REPAIR/SUBACROMIAL DECOMPRESSION/DISTAL CLAVICLE RESECTION;  Surgeon: Senaida Lange, MD;  Location: [facility] OR;  Service: Orthopedics;  Laterality: Left;   SVT ABLATION N/A 01/23/2019   Procedure: SVT ABLATION;  Surgeon: Marinus Maw, MD;  Location[facility] INVASIVE CV LAB;  Service: Cardiovascular;  Laterality: N/A;   TEE WITHOUT CARDIOVERSION N/A 03/21/2012   Procedure: TRANSESOPHAGEAL ECHOCARDIOGRAM (TEE);  Surgeon: Ricki Rodriguez, MD;  Location: [facility] ENDOSCOPY;  Service: Cardiovascular;  Laterality: N/A;   TEE WITHOUT CARDIOVERSION N/A 04/24/2013   Procedure: TRANSESOPHAGEAL ECHOCARDIOGRAM (TEE);  Surgeon: Lars Masson, MD;  Location: [facility] ENDOSCOPY;  Service: Cardiovascular;  Laterality: N/A;   TEE WITHOUT CARDIOVERSION N/A 07/29/2015   Procedure: TRANSESOPHAGEAL ECHOCARDIOGRAM (TEE);  Surgeon: Pricilla Riffle, MD;  Location: [facility] ENDOSCOPY;  Service: Cardiovascular;  Laterality: N/A;   TEE WITHOUT CARDIOVERSION N/A 05/11/2022   Procedure: TRANSESOPHAGEAL ECHOCARDIOGRAM;  Surgeon: Christell Constant, MD;  [facility]AS[facility];  Service: Cardiovascular;  Laterality: N/A;   TONSILLECTOMY     TOTAL KNEE ARTHROPLASTY Right 05/24/2018   Procedure: RIGHT TOTAL KNEE ARTHROPLASTY;  Surgeon: Durene Romans, MD;  Location: [facility] ORS;  Service: Orthopedics;  Laterality: Right;  70 mins   Past Medical History:  Diagnosis Date   Anxiety    Arthritis    Benign  neoplasm of colon    Benign prostatic hyperplasia with urinary obstruction    Bilateral inguinal hernia 05/29/2011   Bilateral pulmonary embolism (HCC) 3/14   admitted to [facility],  treated with Xarelto   Breast lump 03/29/2019   Calculus of kidney 10/21/2016   Cataract    Chronic bilateral low back pain with sciatica 03/21/2012   Chronic pain    Finger mass, right 10/17/2020   History of alcohol abuse 03/07/2016   History of colon polyps 11/18/2015   History of kidney stones    History of pulmonary embolism 03/19/2012   Hyperlipemia    Hypertension    Insomnia    Insomnia due to medical condition 12/23/2016   Polyuria secondary to BPH   Major depressive disorder, recurrent episode (HCC)    Mitral regurgitation 04/24/2013   Mild by TEE   Morbid obesity (HCC) 03/07/2016   Nonrheumatic mitral valve insufficiency 04/25/2013   Mild by TEE  Overview:  Overview:  Mild by TEE Overview:  Overview:  Overview:  Mild by TEE Overview:  Overview:  Mild by TEE   Obesity    OSA (obstructive sleep apnea)    noncompliant with CPAP.  07/27/13- awaiting a CPAP- unable to tolerate mask   Osteoporosis    Persistent atrial fibrillation (HCC)    Recurrent unilateral inguinal hernia 07/17/2013   Restless leg syndrome    takes gabapentin   S/P right total knee arthroplasty 05/24/2018   Sciatica    Spinal stenosis, lumbar region with neurogenic claudication    SVT (supraventricular tachycardia) 03/07/2016   Thrombus of left atrial appendage 03/09/2013   Uncomplicated asthma 06/20/2008   Overview:  Overview:  Overview:  Overview:  Qualifier: Diagnosis of  By: Tawanna Cooler MD, Eugenio Hoes Overview:  Overview:  Qualifier: Diagnosis of  By: Tawanna Cooler MD, Eugenio Hoes   Ventral hernia, unspecified, without mention of obstruction or gangrene    right abdominal wall   Ht 6\' 4"  (1.93  m)   BMI 34.08 kg/m   Opioid Risk Score:   Fall Risk Score:  `1  Depression screen [facility] 2/9     09/22/2022   10:43 AM 06/09/2022   11:33 AM 02/12/2022     2:21 PM 12/10/2021   10:55 AM 06/20/2021    9:08 AM 01/21/2021    9:07 AM 12/24/2020    8:50 AM  Depression screen PHQ 2/9  Decreased Interest 1 0 1 0 0 0 0  Down, Depressed, Hopeless 1 0 1 0 0 0 0  PHQ - 2 Score 2 0 2 0 0 0 0  Altered sleeping 0 0 3  1 0 0  Tired, decreased energy 2 0 3  1 0 0  Change in appetite 2 0 2  0 0 0  Feeling bad or failure about yourself  3 0 0  0 0 0  Trouble concentrating 2 0 1  0 0 0  Moving slowly or fidgety/restless 2 0 0  0 0 0  Suicidal thoughts 1 0 0  0 0 0  PHQ-9 Score 14 0 11  2 0 0  Difficult doing work/chores Very difficult  Somewhat difficult   Not difficult at all       Review of Systems  Musculoskeletal:  Positive for back pain and gait problem.  All other systems reviewed and are negative.     Objective:   Physical Exam  Gen: no distress, normal appearing HEENT: oral mucosa pink and moist, NCAT Cardio: Reg rate Chest: normal effort, normal rate of breathing Abd: soft, non-distended Ext: no edema Psych: pleasant although becomes tearful at times describing his decreased function Skin: intact Neuro: Alert and oriented, follows commands, cranial nerves II through XII grossly intact, normal speech and language RUE: 5/5 Deltoid, 5/5 Biceps, 5/5 Triceps, 5/5 Wrist Ext, 5/5 Grip LUE: 5/5 Deltoid, 5/5 Biceps, 5/5 Triceps, 5/5 Wrist Ext, 5/5 Grip RLE: HF 5/5, KE 5/5,ADF 5/5, APF 5/5 LLE: HF 5/5, KE 5/5, ADF 5/5, APF 5/5 Sensory exam normal for light touch and pain in all 4 limbs. No limb ataxia or cerebellar signs. No abnormal tone appreciated.  No ankle clonus DTR absent bilateral patella and right ankle DTR 1+ left ankle Musculoskeletal:  Tenderness to palpation about bilateral paraspinal muscles in the lumbar spine No significant cervical paraspinal tenderness Kyphotic posture SLR negative bilaterally Fairly good spinal full forward flexion.  Significant pain can pain with extension and takes him a long time to try to extend  his spine. Walks with a cane with antalgic gait. Facet loading positive  MRI from [facility] note 05/14/22 Lumbar MRI 05/13/22: Anatomical variants: none Alignment: Degenerative levoscoliosis. Conus medullaris: terminates at approximately L1. Spinal Cord and Cauda Equina: Visualized portions of the spinal cord is normal in morphology and signal. Normal appearance of the cauda equina.  Bone marrow signal: No suspicious lesions.  T12-L1: Mild disc and facet degenerative changes resulting in mild spinal canal stenosis.  L1-L2: Unremarkable.  L2-L3: Mild disc and facet degenerative changes without significant spinal canal or neural foraminal stenosis.  L3-L4: Disc bulge and facet arthropathy resulting in mild canal stenosis.  L4-L5: Mild disc and facet degenerative changes resulting in mild canal stenosis.  L5-S1: Advanced bilateral facet arthropathy and mild disc degenerative changes resulting in mild left neural foraminal stenosis.  IMPRESSION: Multilevel disc and facet degenerative changes above without high-grade stenosis.   L spine Xray from [facility] note  Spine x-rays 04/01/22: IMPRESSION: 1. S-shaped scoliosis of the thoracolumbar  spine with positive sagittal and coronal imbalance. 2. Mild stepwise retrolisthesis from L2 to L5 without evidence of dynamic instability. 3. 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     Assessment & Plan:   Chronic low back pain with degenerative spinal canal stenosis and multilevel lumbar facet joint degeneration -Will place consult for aquatic therapy-patient has found working in the pool to be most helpful for his back previously -Patient reports he does not think the gabapentin is helping very much -Discussed foods that are helpful for pain -DC gabapentin and start Lyrica 100 mg 3 times daily -Initially plan to refer to Dr. Wynn Banker for [facility] as patient indicated this was helpful in the past.  On chart review it  appears there is some degeneration in upper facet joints of the lumbar spine, will discuss possibility of medial branch block/RFA to upper lumbar spine.  Lower lumbar spine RFA was completed previously by Dr. Alvester Morin.

## 2022-10-01 ENCOUNTER — Ambulatory Visit: Payer: Medicare Other | Attending: Cardiovascular Disease | Admitting: Cardiovascular Disease

## 2022-10-01 ENCOUNTER — Encounter: Payer: Self-pay | Admitting: Cardiovascular Disease

## 2022-10-01 ENCOUNTER — Ambulatory Visit (INDEPENDENT_AMBULATORY_CARE_PROVIDER_SITE_OTHER): Payer: Medicare Other

## 2022-10-01 VITALS — BP 96/70 | HR 72 | Ht 76.0 in | Wt 278.8 lb

## 2022-10-01 DIAGNOSIS — I4819 Other persistent atrial fibrillation: Secondary | ICD-10-CM

## 2022-10-01 NOTE — Progress Notes (Unsigned)
Enrolled patient for a 3 day Zio XT monitor to be mailed to patients home  

## 2022-10-01 NOTE — Progress Notes (Signed)
Electrophysiology Office Note:    Date:  10/01/2022   ID:  Brandon [name]., DOB [birth year], MRN [id number]  PCP:  Etta Grandchild, MD   [facility] Providers Cardiologist:  Lewayne Bunting, MD Electrophysiologist:  Maurice Small, MD     Referring MD: Etta Grandchild, MD   History of Present Illness:    Brandon [name]. is a 72 y.o. male with a hx listed below, significant for persistent atrial fibrillation, AVNRT s/p ablation referred for arrhythmia management.  He underwent an ablation by Dr. Johney Frame for AF in the past. He has also had an ablation for AVNRT by Dr.  Ladona Ridgel.  He lives in [place].  He notes that he is very easily fatigued and is not nearly as capable as he was prior to recurrence of atrial fibrillation.  He had a repeat A-fib ablation and mapping of the atrium in February 2024.  He was noted to have a small amount of breakthrough at the right anterior veins.  There is also evidence of a prior posterior wall ablation with some breakthrough along the roof line.  200 J cardioversion was unsuccessful.  We were able to return to sinus rhythm with a 360 J shock.  He was seen in follow-up in A-fib clinic and, not surprisingly, had recurrence of atrial fibrillation.  He had a repeat cardioversion in May, but this was unsuccessful.   We had scheduled him for Tikosyn load, but he decided not to proceed due to fear of being in the hospital for days.  He reports that he feels much better on flecainide and is maintaining sinus rhythm.  He has become more sleepy however, and his sleep cycles have changed.  He takes a nap in the morning and in the afternoon but ends up staying up till about 2 AM unable to sleep.   EKGs/Labs/Other Studies Reviewed Today:      EKG:  Last EKG results: today - AF, V-rate 73 bpm  TTE: severe LAE   Recent Labs: 10/19/2021: Magnesium 1.8 06/09/2022: ALT 11; TSH 1.85 06/24/2022: BUN 19; Creatinine, Ser 0.95; Hemoglobin 14.1; Platelets 159;  Potassium 4.3; Sodium 142     Physical Exam:    VS:  BP 96/70   Pulse 72   Ht 6\' 4"  (1.93 m)   Wt 278 lb 12.8 oz (126.5 kg)   SpO2 97%   BMI 33.94 kg/m     Wt Readings from Last 3 Encounters:  10/01/22 278 lb 12.8 oz (126.5 kg)  09/22/22 282 lb (127.9 kg)  07/13/22 280 lb (127 kg)     GEN: Well nourished, well developed in no acute distress CARDIAC: Irregular rhythm, no murmurs, rubs, gallops RESPIRATORY:  Normal work of breathing MUSCULOSKELETAL: no edema    ASSESSMENT & PLAN:    Persistent AF: Status post ablation by Dr. Johney Frame, repeat ablation by me.  Very symptomatic. His repeat ablation did not show many targets for ablation. He has, not surprisingly, had recurrence and is very symptomatic. He does not want to be hospitalized for Tikosyn load.  Continue flecainide 50mg  PO BID Will place a 72-hour monitor to evaluate heart rate and rhythm     AVNRT: s/p ablation OSA: will need to be compliant with CPAP to reduce risk of AF recurrence        Medication Adjustments/Labs and Tests Ordered: Current medicines are reviewed at length with the patient today.  Concerns regarding medicines are outlined above.  Orders Placed This Encounter  Procedures   EKG 12-Lead   No orders of the defined types were placed in this encounter.    Signed, Maurice Small, MD  10/01/2022 10:52 AM    [facility]

## 2022-10-01 NOTE — Addendum Note (Signed)
Addended by: Alois Cliche on: 10/01/2022 11:10 AM   Modules accepted: Orders

## 2022-10-01 NOTE — Patient Instructions (Signed)
Medication Instructions:  Your physician recommends that you continue on your current medications as directed. Please refer to the Current Medication list given to you today.  *If you need a refill on your cardiac medications before your next appointment, please call your pharmacy*   Lab Work: None ordered.  If you have labs (blood work) drawn today and your tests are completely normal, you will receive your results only by: MyChart Message (if you have MyChart) OR A paper copy in the mail If you have any lab test that is abnormal or we need to change your treatment, we will call you to review the results.   Testing/Procedures: Christena Deem- Long Term Monitor Instructions  Your physician has requested you wear a ZIO patch monitor for 3 days.  This is a single patch monitor. [employer] supplies one patch monitor per enrollment. Additional stickers are not available. Please do not apply patch if you will be having a Nuclear Stress Test,  Echocardiogram, Cardiac CT, MRI, or Chest Xray during the period you would be wearing the  monitor. The patch cannot be worn during these tests. You cannot remove and re-apply the  ZIO XT patch monitor.  Your ZIO patch monitor will be mailed 3 day USPS to your address on file. It may take 3-5 days  to receive your monitor after you have been enrolled.  Once you have received your monitor, please review the enclosed instructions. Your monitor  has already been registered assigning a specific monitor serial # to you.  Billing and Patient Assistance Program Information  We have supplied [employer] with any of your insurance information on file for billing purposes. [employer] offers a sliding scale Patient Assistance Program for patients that do not have  insurance, or whose insurance does not completely cover the cost of the ZIO monitor.  You must apply for the Patient Assistance Program to qualify for this discounted rate.  To apply, please call [employer] at  [phone], select option 4, select option 2, ask to apply for  Patient Assistance Program. Meredeth Ide will ask your household income, and how many people  are in your household. They will quote your out-of-pocket cost based on that information.  [employer] will also be able to set up a 28-month, interest-free payment plan if needed.  Applying the monitor   Shave hair from upper left chest.  Hold abrader disc by orange tab. Rub abrader in 40 strokes over the upper left chest as  indicated in your monitor instructions.  Clean area with 4 enclosed alcohol pads. Let dry.  Apply patch as indicated in monitor instructions. Patch will be placed under collarbone on left  side of chest with arrow pointing upward.  Rub patch adhesive wings for 2 minutes. Remove white label marked "1". Remove the white  label marked "2". Rub patch adhesive wings for 2 additional minutes.  While looking in a mirror, press and release button in center of patch. A small green light will  flash 3-4 times. This will be your only indicator that the monitor has been turned on.  Do not shower for the first 24 hours. You may shower after the first 24 hours.  Press the button if you feel a symptom. You will hear a small click. Record Date, Time and  Symptom in the Patient Logbook.  When you are ready to remove the patch, follow instructions on the last 2 pages of Patient  Logbook. Stick patch monitor onto the last page of Patient Logbook.  Place Patient  Logbook in the blue and white box. Use locking tab on box and tape box closed  securely. The blue and white box has prepaid postage on it. Please place it in the mailbox as  soon as possible. Your physician should have your test results approximately 7 days after the  monitor has been mailed back to [facility].  Call [facility] Customer Care at [phone] if you have questions regarding  your ZIO XT patch monitor. Call them immediately if you see an orange light  blinking on your  monitor.  If your monitor falls off in less than 4 days, contact our Monitor department at [phone].  If your monitor becomes loose or falls off after 4 days call [employer] at [phone] for  suggestions on securing your monitor    Follow-Up: At [facility], you and your health needs are our priority.  As part of our continuing mission to provide you with exceptional heart care, we have created designated Provider Care Teams.  These Care Teams include your primary Cardiologist (physician) and Advanced Practice Providers (APPs -  Physician Assistants and Nurse Practitioners) who all work together to provide you with the care you need, when you need it.  We recommend signing up for the patient portal called "MyChart".  Sign up information is provided on this After Visit Summary.  MyChart is used to connect with patients for Virtual Visits (Telemedicine).  Patients are able to view lab/test results, encounter notes, upcoming appointments, etc.  Non-urgent messages can be sent to your provider as well.   To learn more about what you can do with MyChart, go to [email or web address].    Your next appointment:   2 weeks with Dr Morrie Sheldon PA, Otilio Saber or Francis Dowse

## 2022-10-09 ENCOUNTER — Other Ambulatory Visit: Payer: Self-pay | Admitting: Physician Assistant

## 2022-10-09 ENCOUNTER — Other Ambulatory Visit: Payer: Self-pay | Admitting: Internal Medicine

## 2022-10-09 DIAGNOSIS — F331 Major depressive disorder, recurrent, moderate: Secondary | ICD-10-CM

## 2022-10-09 NOTE — Telephone Encounter (Signed)
Pt's pharmacy is requesting a refill on amlodipine. Would Dr. Nelly Laurence like to refill this medication? Please address

## 2022-10-10 ENCOUNTER — Other Ambulatory Visit: Payer: Self-pay | Admitting: Internal Medicine

## 2022-10-10 DIAGNOSIS — F331 Major depressive disorder, recurrent, moderate: Secondary | ICD-10-CM

## 2022-10-10 MED ORDER — ESCITALOPRAM OXALATE 20 MG PO TABS
20.0000 mg | ORAL_TABLET | Freq: Every day | ORAL | 0 refills | Status: DC
Start: 2022-10-10 — End: 2022-10-10

## 2022-10-10 MED ORDER — ESCITALOPRAM OXALATE 20 MG PO TABS
20.0000 mg | ORAL_TABLET | Freq: Every day | ORAL | 0 refills | Status: DC
Start: 2022-10-10 — End: 2022-11-16

## 2022-10-14 DIAGNOSIS — I4819 Other persistent atrial fibrillation: Secondary | ICD-10-CM | POA: Diagnosis not present

## 2022-10-20 ENCOUNTER — Other Ambulatory Visit: Payer: Self-pay

## 2022-10-20 ENCOUNTER — Encounter (HOSPITAL_BASED_OUTPATIENT_CLINIC_OR_DEPARTMENT_OTHER): Payer: Self-pay | Admitting: Physical Therapy

## 2022-10-20 ENCOUNTER — Ambulatory Visit (HOSPITAL_BASED_OUTPATIENT_CLINIC_OR_DEPARTMENT_OTHER): Payer: Medicare Other | Attending: Physical Medicine & Rehabilitation | Admitting: Physical Therapy

## 2022-10-20 DIAGNOSIS — M6281 Muscle weakness (generalized): Secondary | ICD-10-CM | POA: Diagnosis not present

## 2022-10-20 DIAGNOSIS — M545 Low back pain, unspecified: Secondary | ICD-10-CM | POA: Diagnosis not present

## 2022-10-20 DIAGNOSIS — G8929 Other chronic pain: Secondary | ICD-10-CM | POA: Diagnosis not present

## 2022-10-20 DIAGNOSIS — R2689 Other abnormalities of gait and mobility: Secondary | ICD-10-CM | POA: Diagnosis not present

## 2022-10-20 DIAGNOSIS — M5459 Other low back pain: Secondary | ICD-10-CM | POA: Insufficient documentation

## 2022-10-20 NOTE — Therapy (Unsigned)
OUTPATIENT PHYSICAL THERAPY THORACOLUMBAR EVALUATION   Patient Name: Brandon [name]. MRN: [id number] DOB:[birth year], 72 y.o., male Today's Date: 10/20/2022  END OF SESSION:  PT End of Session - 10/20/22 1417     Visit Number 1    Number of Visits 16    Date for PT Re-Evaluation 12/18/22    Authorization Type mcr    Progress Note Due on Visit 10    PT Start Time 1118    PT Stop Time 1200    PT Time Calculation (min) 42 min    Activity Tolerance Patient tolerated treatment well    Behavior During Therapy WFL for tasks assessed/performed             Past Medical History:  Diagnosis Date   Anxiety    Arthritis    Benign neoplasm of colon    Benign prostatic hyperplasia with urinary obstruction    Bilateral inguinal hernia 05/29/2011   Bilateral pulmonary embolism (HCC) 3/14   admitted to [facility],  treated with Xarelto   Breast lump 03/29/2019   Calculus of kidney 10/21/2016   Cataract    Chronic bilateral low back pain with sciatica 03/21/2012   Chronic pain    Finger mass, right 10/17/2020   History of alcohol abuse 03/07/2016   History of colon polyps 11/18/2015   History of kidney stones    History of pulmonary embolism 03/19/2012   Hyperlipemia    Hypertension    Insomnia    Insomnia due to medical condition 12/23/2016   Polyuria secondary to BPH   Major depressive disorder, recurrent episode (HCC)    Mitral regurgitation 04/24/2013   Mild by TEE   Morbid obesity (HCC) 03/07/2016   Nonrheumatic mitral valve insufficiency 04/25/2013   Mild by TEE  Overview:  Overview:  Mild by TEE Overview:  Overview:  Overview:  Mild by TEE Overview:  Overview:  Mild by TEE   Obesity    OSA (obstructive sleep apnea)    noncompliant with CPAP.  07/27/13- awaiting a CPAP- unable to tolerate mask   Osteoporosis    Persistent atrial fibrillation (HCC)    Recurrent unilateral inguinal hernia 07/17/2013   Restless leg syndrome    takes gabapentin   S/P right total knee arthroplasty  05/24/2018   Sciatica    Spinal stenosis, lumbar region with neurogenic claudication    SVT (supraventricular tachycardia) (HCC) 03/07/2016   Thrombus of left atrial appendage 03/09/2013   Uncomplicated asthma 06/20/2008   Overview:  Overview:  Overview:  Overview:  Qualifier: Diagnosis of  By: Tawanna Cooler MD, Eugenio Hoes Overview:  Overview:  Qualifier: Diagnosis of  By: Tawanna Cooler MD, Eugenio Hoes   Ventral hernia, unspecified, without mention of obstruction or gangrene    right abdominal wall   Past Surgical History:  Procedure Laterality Date   ATRIAL FIBRILLATION ABLATION N/A 02/27/2022   Procedure: ATRIAL FIBRILLATION ABLATION;  Surgeon: Maurice Small, MD;  Location: [facility] INVASIVE CV LAB;  Service: Cardiovascular;  Laterality: N/A;   CARDIOVERSION N/A 03/21/2012   Procedure: CARDIOVERSION;  Surgeon: Ricki Rodriguez, MD;  Location: [facility] ENDOSCOPY;  Service: Cardiovascular;  Laterality: N/A;   CARDIOVERSION N/A 04/24/2013   Procedure: CARDIOVERSION;  Surgeon: Lars Masson, MD;  Location: [facility] ENDOSCOPY;  Service: Cardiovascular;  Laterality: N/A;   CARDIOVERSION N/A 06/27/2015   Procedure: CARDIOVERSION;  Surgeon: Laurey Morale, MD;  Location: [facility] ENDOSCOPY;  Service: Cardiovascular;  Laterality: N/A;   CARDIOVERSION N/A 11/11/2021   Procedure: CARDIOVERSION;  Surgeon:  Lewayne Bunting, MD;  Location: [facility] ENDOSCOPY;  Service: Cardiovascular;  Laterality: N/A;   CARDIOVERSION N/A 05/11/2022   Procedure: CARDIOVERSION;  Surgeon: Christell Constant, MD;  Location: [facility] INVASIVE CV LAB;  Service: Cardiovascular;  Laterality: N/A;   CARDIOVERSION N/A 07/13/2022   Procedure: CARDIOVERSION;  Surgeon: Thurmon Fair, MD;  Location: [facility] INVASIVE CV LAB;  Service: Cardiovascular;  Laterality: N/A;   CATARACT EXTRACTION     COLONOSCOPY W/ POLYPECTOMY     ELECTROPHYSIOLOGIC STUDY N/A 07/30/2015   Procedure: Atrial Fibrillation Ablation;  Surgeon: Hillis Range, MD;  Location: [facility] INVASIVE CV LAB;  Service: Cardiovascular;   Laterality: N/A;   EXTRACORPOREAL SHOCK WAVE LITHOTRIPSY Right 10/29/2016   Procedure: RIGHT EXTRACORPOREAL SHOCK WAVE LITHOTRIPSY (ESWL);  Surgeon: Sebastian Ache, MD;  Location: [facility] ORS;  Service: Urology;  Laterality: Right;   EYE SURGERY Right    cataract   FEMUR FRACTURE SURGERY     HERNIA REPAIR  07/06/2011   INGUINAL HERNIA REPAIR Right 07/28/2013   Procedure: RIGHT INGUINAL HERNIA REPAIR;  Surgeon: Wilmon Arms. Corliss Skains, MD;  Location: [facility] OR;  Service: General;  Laterality: Right;   INGUINAL HERNIA REPAIR Right 09/22/2016   Procedure: LAPAROSCOPIC RIGHT INGUINAL HERNIA;  Surgeon: Sheliah Hatch De Blanch, MD;  Location: [facility] ORS;  Service: General;  Laterality: Right;  With MESH   INSERTION OF MESH Right 07/28/2013   Procedure: INSERTION OF MESH;  Surgeon: Wilmon Arms. Corliss Skains, MD;  Location: [facility] OR;  Service: General;  Laterality: Right;   KNEE ARTHROSCOPY     left   REPLACEMENT TOTAL KNEE Left    ROTATOR CUFF REPAIR     right   ROTATOR CUFF REPAIR Left 03/08/2014   DR SUPPLE   SHOULDER ARTHROSCOPY WITH ROTATOR CUFF REPAIR AND SUBACROMIAL DECOMPRESSION Left 03/08/2014   Procedure: LEFT SHOULDER ARTHROSCOPY WITH ROTATOR CUFF REPAIR/SUBACROMIAL DECOMPRESSION/DISTAL CLAVICLE RESECTION;  Surgeon: Senaida Lange, MD;  Location: [facility] OR;  Service: Orthopedics;  Laterality: Left;   SVT ABLATION N/A 01/23/2019   Procedure: SVT ABLATION;  Surgeon: Marinus Maw, MD;  Location: [facility] INVASIVE CV LAB;  Service: Cardiovascular;  Laterality: N/A;   TEE WITHOUT CARDIOVERSION N/A 03/21/2012   Procedure: TRANSESOPHAGEAL ECHOCARDIOGRAM (TEE);  Surgeon: Ricki Rodriguez, MD;  Location: [facility] ENDOSCOPY;  Service: Cardiovascular;  Laterality: N/A;   TEE WITHOUT CARDIOVERSION N/A 04/24/2013   Procedure: TRANSESOPHAGEAL ECHOCARDIOGRAM (TEE);  Surgeon: Lars Masson, MD;  Location: [facility] ENDOSCOPY;  Service: Cardiovascular;  Laterality: N/A;   TEE WITHOUT CARDIOVERSION N/A 07/29/2015   Procedure: TRANSESOPHAGEAL ECHOCARDIOGRAM  (TEE);  Surgeon: Pricilla Riffle, MD;  Location: [facility] ENDOSCOPY;  Service: Cardiovascular;  Laterality: N/A;   TEE WITHOUT CARDIOVERSION N/A 05/11/2022   Procedure: TRANSESOPHAGEAL ECHOCARDIOGRAM;  Surgeon: Christell Constant, MD;  Location: [facility] INVASIVE CV LAB;  Service: Cardiovascular;  Laterality: N/A;   TONSILLECTOMY     TOTAL KNEE ARTHROPLASTY Right 05/24/2018   Procedure: RIGHT TOTAL KNEE ARTHROPLASTY;  Surgeon: Durene Romans, MD;  Location: [facility] ORS;  Service: Orthopedics;  Laterality: Right;  70 mins   Patient Active Problem List   Diagnosis Date Noted   Hypercoagulable state due to persistent atrial fibrillation (HCC) 03/27/2022   Hyperaldosteronism (HCC) 08/02/2021   Restless leg syndrome 07/09/2021   Primary osteoarthritis of left hip 06/20/2021   Chronic idiopathic constipation 06/10/2021   Long-term current use of opiate analgesic 06/10/2021   Hyperlipidemia LDL goal <130 02/03/2021   Seasonal allergic rhinitis due to pollen 02/03/2021   Morbid obesity (HCC) 12/24/2020   Spinal  stenosis, lumbar region with neurogenic claudication    SVT (supraventricular tachycardia) (HCC) 01/21/2019   BPH (benign prostatic hyperplasia) 10/21/2016   Essential hypertension 10/21/2016   Depression, major, single episode, complete remission (HCC) 10/21/2016   Paroxysmal atrial fibrillation (HCC)    Restless legs syndrome 02/08/2014   Generalized anxiety disorder 12/12/2013   Persistent atrial fibrillation 04/26/2013   Current use of long term anticoagulation    Obstructive sleep apnea 03/09/2013   History of pulmonary embolism 03/19/2012    PCP: Etta Grandchild, MD   REFERRING PROVIDER:   Fanny Dance, MD    REFERRING DIAG: Chronic bilateral low back pain without sciatica   Rationale for Evaluation and Treatment: Rehabilitation  THERAPY DIAG:  Other low back pain  Muscle weakness (generalized)  Other abnormalities of gait and mobility  ONSET DATE: exacerbation last  year  SUBJECTIVE:                                                                                                                                                                                           SUBJECTIVE STATEMENT: I am to the point where I can not do the exercises I want to be able to do.  Limited by LBP. Bilateral knee replacement approx 5 yrs ago and bilat rotator cuff replacement. I feel like I need some water aerobics that would make me feel better I think but I am not sure I could tolerate it. I have a pool in my complex swam in the summer. Used to swim and bike until I retired in 2008.  Have not done anything organized in 10 years. Have left hip pain anterior which comes and go. Use a cane all the time, mostly in home as well. Have 1 inch lift in left shoe due leg length difference from an old femur fx back in the 70's  PERTINENT HISTORY:  Bilat TKR Bilat rot cuff repair shoulders degenerative spinal canal stenosis and multilevel lumbar facet joint degeneration  . . .will discuss possibility of medial branch block/RFA to upper lumbar spine. Lower lumbar spine RFA was completed previously by Dr. Alvester Morin   PAIN:  Are you having pain? Yes: NPRS scale: 0/10 sititng; walking 7-8/10 Pain location: across LB Pain description: vice pain more you walk the tighter it get Aggravating factors: Walking 5-10 minutes; sitting too long 15-30 mins Relieving factors: sitting immediate, resting, gabapentin  PRECAUTIONS: Fall  RED FLAGS: None   WEIGHT BEARING RESTRICTIONS: No  FALLS:  Has patient fallen in last 6 months? No  LIVING ENVIRONMENT: Lives with: lives alone Lives in: House/apartment Stairs: No Has following equipment at home: Single point  cane and shower chair  OCCUPATION: retired, read, play guitar  PLOF: Independent  PATIENT GOALS: walk 100 yds pain free, more mobility  NEXT MD VISIT: 11/03/22  OBJECTIVE:  Note: Objective measures were completed at Evaluation  unless otherwise noted.  DIAGNOSTIC FINDINGS:  5/23 xray Left hipIMPRESSION: No recent fracture or dislocation is seen. 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 Degenerative changes are noted in both hips, more severe in the left hip with interval progression.  PATIENT SURVEYS:  FOTO Primary score 40% with goal of 49%   COGNITION: Overall cognitive status: Within functional limits for tasks assessed     SENSATION: WFL  MUSCLE LENGTH: Hamstrings: tightness right, wfl left   POSTURE:  left leg length difference ~ 1 inch, forward shoulder, slightly flexed lumbar   PALPATION: Mild TTP right iliopsoas No TTP throughout lumbar spine.  LUMBAR ROM:   AROM eval  Flexion full  Extension Limited 50%  Right lateral flexion Limited 25%  Left lateral flexion full  Right rotation   Left rotation    (Blank rows = not tested)  LOWER EXTREMITY ROM:     Active  Right eval Left eval  Hip flexion    Hip extension    Hip abduction    Hip adduction    Hip internal rotation    Hip external rotation    Knee flexion 120 110  Knee extension -5 0  Ankle dorsiflexion    Ankle plantarflexion    Ankle inversion    Ankle eversion     (Blank rows = not tested)  LOWER EXTREMITY MMT:    MMT Right eval Left eval  Hip flexion 47.0 48.2  Hip extension    Hip abduction 54.8 46.3  Hip adduction    Hip internal rotation    Hip external rotation    Knee flexion 40.7 53.9  Knee extension    Ankle dorsiflexion    Ankle plantarflexion    Ankle inversion    Ankle eversion     (Blank rows = not tested)  LUMBAR SPECIAL TESTS:  Slump test: Negative  FUNCTIONAL TESTS:  Timed up and go (TUG): 21.01 4 stage Balance:1,2&3 passed. 4 SLS x5s  GAIT: Distance walked: 400 Assistive device utilized: Single point cane Level of assistance: Complete Independence Comments: no arm  swing, guarded posture shoulder retracted, reduced hip flex  TODAY'S TREATMENT:                                                                                                                              DATE:  Eval Testing    PATIENT EDUCATION:  Education details: Discussed eval findings, rehab rationale, aquatic program progression/POC and pools in area. Patient is in agreement  Person educated: Patient Education method: Explanation Education comprehension: verbalized understanding  HOME EXERCISE PROGRAM: tba  ASSESSMENT:  CLINICAL IMPRESSION: Patient is a 72 y.o.  M who was seen today for physical therapy evaluation and treatment for LBP.  Pt with history of spinal stenosis and multilevel lumbar facet joint degeneration. He presents using cane amb distance to pool with reports of 7/10 pain. He reports an active life style exercising daily until a ~ 10 yrs ago.  He is limited also by OA in hips, bilat TKRs and rotator cuff surerys in past 5 years.  His symptoms  are consistent with spinal stenosis and facet joint degeneration.  No reports of radicular pain.  His Lumbar ROM is only mildy limited but he does have strength deficits. His TUG test is slowed indicating decreased LE functional mobility and a higher fall risk.  He will benefit from skill PT to improve all areas of deficit and encourage return to indep exercise program for improved health, QOL and better pain management.  OBJECTIVE IMPAIRMENTS: Abnormal gait, decreased activity tolerance, decreased balance, decreased mobility, difficulty walking, decreased strength, postural dysfunction, and pain.   ACTIVITY LIMITATIONS: lifting, sitting, standing, stairs, transfers, and locomotion level  PARTICIPATION LIMITATIONS: shopping, community activity, and yard work  PERSONAL FACTORS: Fitness, Past/current experiences, Time since onset of injury/illness/exacerbation, and 1-2 comorbidities: a-fib; OA  are also affecting patient's  functional outcome.   REHAB POTENTIAL: Good  CLINICAL DECISION MAKING: Evolving/moderate complexity  EVALUATION COMPLEXITY: Moderate   GOALS: Goals reviewed with patient? Yes  SHORT TERM GOALS: Target date: 11/24/22  Pt will tolerate full aquatic sessions consistently without increase in pain and with improving function to demonstrate good toleration and effectiveness of intervention.  Baseline: Goal status: INITIAL  2.  Pt will improve on Tug test to <or=  17s to demonstrate improvement in lower extremity function, mobility and decreased fall risk. Baseline: 21.01 Goal status: INITIAL  3.  Pt will gain access to wellness center to have access to gym and pool for long term commitment to exercise. Baseline: none Goal status: INITIAL  4.  Pt will report decrease in LBP with walking to setting (400-500 ft) to </=5/10 Baseline: 7-8 Goal status: INITIAL    LONG TERM GOALS: Target date: 12/18/22  Pt to meet stated Foto Goal 49% Baseline: 40% Goal status: INITIAL  2.  Pt will have full lumbar ROM without increase in pain Baseline:  Goal status: INITIAL  3.  Pt will improve strength in all areas listed by 10lbs to demonstrate improved overall physical function Baseline:  Goal status: INITIAL  4.  Pt will amb up towards 20 minutes community distance of (1000 ft) prior to requiring seated rest period Baseline: 5-10 mins Goal status: INITIAL  5.  Pt will be indep with final HEP's (land and aquatic as appropriate) for continued management of condition Baseline:  Goal status: INITIAL  6.  Pt will complete water aerobic class in home pool Baseline:  Goal status: INITIAL  PLAN:  PT FREQUENCY: 2x/week  PT DURATION: 8 weeks  PLANNED INTERVENTIONS: 97110-Therapeutic exercises, 97530- Therapeutic activity, 97112- Neuromuscular re-education, 97535- Self Care, 16109- Manual therapy, [phone]- Gait training, [phone]- Orthotic Fit/training, [phone]- Aquatic Therapy, 97014-  Electrical stimulation (unattended), [phone]- Ionotophoresis 4mg /ml Dexamethasone, Balance training, Stair training, Taping, Dry Needling, Joint mobilization, Spinal mobilization, DME instructions, Cryotherapy, and Moist heat.  PLAN FOR NEXT SESSION: aquatic first 6-8 visits then alternating land/aquatic.    Geni Bers, PT 10/20/2022, 2:19 PM

## 2022-10-23 ENCOUNTER — Encounter: Payer: Self-pay | Admitting: Cardiovascular Disease

## 2022-10-23 ENCOUNTER — Ambulatory Visit: Payer: Medicare Other | Attending: Cardiovascular Disease | Admitting: Cardiovascular Disease

## 2022-10-23 VITALS — BP 108/76 | HR 66 | Ht 76.0 in | Wt 283.0 lb

## 2022-10-23 DIAGNOSIS — I4819 Other persistent atrial fibrillation: Secondary | ICD-10-CM | POA: Diagnosis not present

## 2022-10-23 NOTE — Progress Notes (Signed)
Electrophysiology Office Note:    Date:  10/23/2022   ID:  Brandon [name]., DOB Feb 06, 1950, MRN [id number]  PCP:  Etta Grandchild, MD   [facility] Providers Cardiologist:  Brandon Bunting, MD Electrophysiologist:  Brandon Small, MD     Referring MD: Etta Grandchild, MD   History of Present Illness:    Brandon Garino. is a 72 y.o. male with a hx listed below, significant for persistent atrial fibrillation, AVNRT s/p ablation referred for arrhythmia management.  He underwent an ablation by Dr. Johney Frame for AF in the past. He has also had an ablation for AVNRT by Dr.  Ladona Ridgel.  He lives in [place].  He notes that he is very easily fatigued and is not nearly as capable as he was prior to recurrence of atrial fibrillation.  He had a repeat A-fib ablation and mapping of the atrium in February 2024.  He was noted to have a [name] amount of breakthrough at the right anterior veins.  There is also evidence of a prior posterior wall ablation with some breakthrough along the roof line.  200 J cardioversion was unsuccessful.  We were able to return to sinus rhythm with a 360 J shock.  He was seen in follow-up in A-fib clinic and, not surprisingly, had recurrence of atrial fibrillation.  He had a repeat cardioversion in May, but this was unsuccessful.   We had scheduled him for Tikosyn load, but he decided not to proceed due to fear of being in the hospital for days.  He reports that he feels much better on flecainide and is maintaining sinus rhythm.  He has become more sleepy however, and his sleep cycles have changed.  He takes a nap in the morning and in the afternoon but ends up staying up till about 2 AM unable to sleep.   EKGs/Labs/Other Studies Reviewed Today:      EKG Interpretation Date/Time:  Friday October 23 2022 11:25:07 EDT Ventricular Rate:  66 PR Interval:  248 QRS Duration:  114 QT Interval:  402 QTC Calculation: 421 R Axis:   -27  Text Interpretation: Sinus  rhythm with 1st degree A-V block When compared with ECG of 01-Oct-2022 10:28, No significant change was found Confirmed by York Pellant [phone] on 10/23/2022 11:47:51 AM   TTE: severe LAE   Recent Labs: 06/09/2022: ALT 11; TSH 1.85 06/24/2022: BUN 19; Creatinine, Ser 0.95; Hemoglobin 14.1; Platelets 159; Potassium 4.3; Sodium 142     Physical Exam:    VS:  BP 108/76   Pulse 66   Ht 6\' 4"  (1.93 m)   Wt 283 lb (128.4 kg)   SpO2 92%   BMI 34.45 kg/m     Wt Readings from Last 3 Encounters:  10/23/22 283 lb (128.4 kg)  10/01/22 278 lb 12.8 oz (126.5 kg)  09/22/22 282 lb (127.9 kg)     GEN: Well nourished, well developed in no acute distress CARDIAC: Irregular rhythm, no murmurs, rubs, gallops RESPIRATORY:  Normal work of breathing MUSCULOSKELETAL: no edema    ASSESSMENT & PLAN:    Persistent AF:  Status post ablation by Dr. Johney Frame, repeat ablation by me.   Very symptomatic. His repeat ablation did not show many targets for ablation. He has, not surprisingly, had recurrence and is very symptomatic. He does not want to be hospitalized for Tikosyn load.  Continue flecainide 50mg  PO BID Continue carvedilol 25     AVNRT:  s/p ablation  OSA:  will need to be compliant with CPAP to reduce risk of AF recurrence        Medication Adjustments/Labs and Tests Ordered: Current medicines are reviewed at length with the patient today.  Concerns regarding medicines are outlined above.  Orders Placed This Encounter  Procedures   EKG 12-Lead   No orders of the defined types were placed in this encounter.    Signed, Brandon Small, MD  10/23/2022 11:48 AM    [facility]

## 2022-10-23 NOTE — Patient Instructions (Signed)
Medication Instructions:  Your physician recommends that you continue on your current medications as directed. Please refer to the Current Medication list given to you today. *If you need a refill on your cardiac medications before your next appointment, please call your pharmacy*   Follow-Up: At [facility], you and your health needs are our priority.  As part of our continuing mission to provide you with exceptional heart care, we have created designated Provider Care Teams.  These Care Teams include your primary Cardiologist (physician) and Advanced Practice Providers (APPs -  Physician Assistants and Nurse Practitioners) who all work together to provide you with the care you need, when you need it.  We recommend signing up for the patient portal called "MyChart".  Sign up information is provided on this After Visit Summary.  MyChart is used to connect with patients for Virtual Visits (Telemedicine).  Patients are able to view lab/test results, encounter notes, upcoming appointments, etc.  Non-urgent messages can be sent to your provider as well.   To learn more about what you can do with MyChart, go to [email or web address].    Your next appointment:   6 month(s)  Provider:   You will follow up in the Atrial Fibrillation Clinic located at [facility]. Your provider will be: Clint R. Fenton, PA-C or Lake Bells, PA-C

## 2022-10-27 ENCOUNTER — Encounter (HOSPITAL_BASED_OUTPATIENT_CLINIC_OR_DEPARTMENT_OTHER): Payer: Self-pay | Admitting: Physical Therapy

## 2022-10-27 ENCOUNTER — Telehealth: Payer: Self-pay

## 2022-10-27 ENCOUNTER — Telehealth (HOSPITAL_BASED_OUTPATIENT_CLINIC_OR_DEPARTMENT_OTHER): Payer: Self-pay | Admitting: Physical Therapy

## 2022-10-27 ENCOUNTER — Ambulatory Visit (HOSPITAL_BASED_OUTPATIENT_CLINIC_OR_DEPARTMENT_OTHER): Payer: Medicare Other | Admitting: Physical Therapy

## 2022-10-27 ENCOUNTER — Encounter (HOSPITAL_BASED_OUTPATIENT_CLINIC_OR_DEPARTMENT_OTHER): Payer: Self-pay

## 2022-10-27 DIAGNOSIS — R2689 Other abnormalities of gait and mobility: Secondary | ICD-10-CM

## 2022-10-27 DIAGNOSIS — M6281 Muscle weakness (generalized): Secondary | ICD-10-CM

## 2022-10-27 DIAGNOSIS — M5459 Other low back pain: Secondary | ICD-10-CM

## 2022-10-27 NOTE — Progress Notes (Unsigned)
PROCEDURE RECORD [facility]   Name: Brandon [name]. DOB:03-16-1950 MRN: [id number]  Date:10/27/2022  Physician: Claudette Laws, MD    Nurse/CMA: Charise Carwin MA  Allergies:  Allergies  Allergen Reactions   Ace Inhibitors Cough   Albuterol Other (See Comments)    Racing heart    Consent Signed: {yes [id number]}  Is patient diabetic? {yes no:314532}  CBG today? ***  Pregnant: {yes no:314532} LMP: No LMP for male patient. (age 15-55)  Anticoagulants: {Yes/No:19989} Anti-inflammatory: {Yes/No:19989} Antibiotics: {Yes/No:19989}  Procedure: Medial Branch Block  Position: Prone Start Time: ***  End Time: ***  Fluoro Time: ***  RN/CMA      Time      BP      Pulse      Respirations      O2 Sat      S/S      Pain Level       D/C home with ***, patient A & O X 3, D/C instructions reviewed, and sits independently.

## 2022-10-27 NOTE — Telephone Encounter (Signed)
Mr. Neidhart has a pending procedure on 10/29/2022. Will it be an ESI or MMB?   Call back phone [phone].

## 2022-10-27 NOTE — Therapy (Signed)
Pt left without being seen.

## 2022-10-27 NOTE — Telephone Encounter (Signed)
Left VM on pt phone listed in regards to incident that occurred today as pt arrived for his session. He ultimately left without being seen. He is encouraged to call back and discuss incident as well as consider continuing with physical therapy.

## 2022-10-28 NOTE — Telephone Encounter (Signed)
Mr. Vale call was returned. I was not able to leave a message.  No voice ID.   He will receiving a Bilateral T12,L1,L2 MBB . No driver is needed. He may have some numbness after the procedure.

## 2022-10-29 ENCOUNTER — Encounter: Payer: Self-pay | Admitting: Physical Medicine & Rehabilitation

## 2022-10-29 ENCOUNTER — Encounter: Payer: Medicare Other | Attending: Physical Medicine & Rehabilitation | Admitting: Physical Medicine & Rehabilitation

## 2022-10-29 VITALS — BP 118/80 | HR 72 | Ht 76.0 in | Wt 180.0 lb

## 2022-10-29 DIAGNOSIS — M47817 Spondylosis without myelopathy or radiculopathy, lumbosacral region: Secondary | ICD-10-CM | POA: Insufficient documentation

## 2022-10-29 MED ORDER — LIDOCAINE HCL (PF) 2 % IJ SOLN
4.0000 mL | Freq: Once | INTRAMUSCULAR | Status: AC
Start: 2022-10-29 — End: 2022-10-29
  Administered 2022-10-29: 4 mL

## 2022-10-29 MED ORDER — LIDOCAINE HCL 1 % IJ SOLN
10.0000 mL | Freq: Once | INTRAMUSCULAR | Status: AC
Start: 2022-10-29 — End: 2022-10-29
  Administered 2022-10-29: 10 mL

## 2022-10-29 MED ORDER — IOHEXOL 180 MG/ML  SOLN
2.0000 mL | Freq: Once | INTRAMUSCULAR | Status: AC
  Administered 2022-10-29: 2 mL

## 2022-10-29 NOTE — Progress Notes (Signed)
Bilateral T 12, L1, L2 medial branch blocks under fluoroscopic guidance  Indication: Lumbar pain which is not relieved by medication management or other conservative care and interfering with self-care and mobility.  Informed consent was obtained after describing risks and benefits of the procedure with the patient, this includes bleeding, bruising, infection, paralysis and medication side effects. The patient wishes to proceed and has given written consent. The patient was placed in a prone position. The lumbar area was marked and prepped with Betadine. 1-2 ML of 1% lidocaine was injected into each of 6 areas into the skin and subcutaneous tissue. Then a 22-gauge 3.5in spinal needle was inserted targeting the junction of the left L3 superior articular process /transverse process junction. Needle was advanced under fluoroscopic guidance. Bone contact was made. Isovue 200 was injected x0.5 mL demonstrating no intravascular uptake. Then a solution containing 2% MPF lidocaine was injected x0.5 mL. Then the left L2 superior articular process in transverse process junction was targeted. Bone contact was made. Omnipaque 180 was injected x0.5 mL demonstrating no intravascular uptake. Then a solution containing 2% MPF lidocaine was injected x0.5 mL. Then the left L1 superior articular process in transverse process junction was targeted. Bone contact was made. Omnipaque 180 was injected x0.5 mL demonstrating no intravascular uptake. Then a solution 2% MPF lidocaine was injected x0.5 mL. this same procedure was performed on the right side using the same needle, technique, and injectate. Patient tolerated procedure well. Post procedure instructions were given. Please refer to post procedure form.  Pt had 30% relief post procedure, pt states he will track pain for the rest of the day and give results to Dr Benjie Karvonen.  If pt has pain relief of 80% or greater within ~2hours of injection would schedule for repeat MBB

## 2022-10-30 ENCOUNTER — Encounter: Payer: Self-pay | Admitting: Physical Medicine & Rehabilitation

## 2022-11-12 ENCOUNTER — Encounter: Payer: Self-pay | Admitting: Physical Medicine & Rehabilitation

## 2022-11-12 ENCOUNTER — Encounter: Payer: Medicare Other | Attending: Physical Medicine & Rehabilitation | Admitting: Physical Medicine & Rehabilitation

## 2022-11-12 VITALS — BP 133/88 | HR 71 | Ht 76.0 in | Wt 286.2 lb

## 2022-11-12 DIAGNOSIS — F32A Depression, unspecified: Secondary | ICD-10-CM | POA: Diagnosis not present

## 2022-11-12 DIAGNOSIS — G8929 Other chronic pain: Secondary | ICD-10-CM | POA: Diagnosis not present

## 2022-11-12 DIAGNOSIS — M47816 Spondylosis without myelopathy or radiculopathy, lumbar region: Secondary | ICD-10-CM | POA: Diagnosis not present

## 2022-11-12 DIAGNOSIS — M545 Low back pain, unspecified: Secondary | ICD-10-CM | POA: Insufficient documentation

## 2022-11-12 NOTE — Progress Notes (Signed)
Subjective:    Patient ID: Brandon Muller., male    DOB: [birth year], 72 y.o.   MRN: [id number]  HPI   HPI  Brandon Kamphaus. is a 72 y.o. year old male  who  has a past medical history of Anxiety, Arthritis, Benign neoplasm of colon, Benign prostatic hyperplasia with urinary obstruction, Bilateral inguinal hernia (05/29/2011), Bilateral pulmonary embolism (HCC) (3/14), Breast lump (03/29/2019), Calculus of kidney (10/21/2016), Cataract, Chronic bilateral low back pain with sciatica (03/21/2012), Chronic pain, Finger mass, right (10/17/2020), History of alcohol abuse (03/07/2016), History of colon polyps (11/18/2015), History of kidney stones, History of pulmonary embolism (03/19/2012), Hyperlipemia, Hypertension, Insomnia, Insomnia due to medical condition (12/23/2016), Major depressive disorder, recurrent episode (HCC), Mitral regurgitation (04/24/2013), Morbid obesity (HCC) (03/07/2016), Nonrheumatic mitral valve insufficiency (04/25/2013), Obesity, OSA (obstructive sleep apnea), Osteoporosis, Persistent atrial fibrillation (HCC), Recurrent unilateral inguinal hernia (07/17/2013), Restless leg syndrome, S/P right total knee arthroplasty (05/24/2018), Sciatica, Spinal stenosis, lumbar region with neurogenic claudication, SVT (supraventricular tachycardia) (HCC) (03/07/2016), Thrombus of left atrial appendage (03/09/2013), Uncomplicated asthma (06/20/2008), and Ventral hernia, unspecified, without mention of obstruction or gangrene.   They are presenting to PM&R clinic as a new patient for pain management evaluation. They were referred by Dr. Yetta Barre for treatment of lumbar spinal stenosis pain.  Brandon [name] reports progressive worsening pain in his lower back that radiates to his outer and inner thighs.  He says this pain has been worsening gradually over many years.  Pain is worsened with standing up and walking.  Pain has a squeezing quality and he says the pain worsens with each step when he is walking.  Pain is improved  with bending forward or sitting down.  He reports having a history of scoliosis.  He was previously followed by [facility].  Patient was also seen for several years by Dr. Alvester Morin.  He had a trial of spinal cord stimulator but appears it was not beneficial enough to where he wanted to have it implanted.   Patient reports multiple spinal injections by Dr. Alvester Morin and he thinks that injections that he had reduced his pain from several weeks.  He is not sure which injections ESI versus facet joint ablation provided him with the best relief.  Chart review indicates he had radiofrequency ablation of left L3-4, L4-5 and L5-S1 facet joints by Dr. Alvester Morin after having a double diagnostic medial branch block with more than 50% relief.  It does not appear he had a lot of relief from the RFA.  More recently he was seen at [facility] neurosurgery.  Patient reports neurosurgery suggested he do injections again and he wanted to do them in [place] versus doing them at [facility] due to distance.  Chart review indicates neurosurgery feels that possibly upper lumbar facet joints could be contributing to his pain and they suggested upper lumbar MBB and possible RFA.  Patient reports having leg length discrepancy and has a lift in his left shoe.  Patient reports he tries to stay active and tries to exercise in a pool when he can.  He used to like to swim when he was younger.  He uses a cane for balance but does not want to use a walker.  Red flag symptoms: No red flags for back pain endorsed in Hx or ROS  Medications tried: Topical medications denies , tried CBD ointment in the past  Nsaids - limited by Afib  Tylenol  - Helps a little Opiates  Hydrocodone and oxycodone- help a little,  helps him.  He would like to use this only intermittently when his pain is very severe during acute exacerbations Gabapentin /- Takes TID 800mg   TCAs  -denies  SNRIs  -does not recall  Other treatments: PT- helps while he is getting it  but results havent lasted  TENs unit -denies benefit Injections -ESI, RFA, spinal cord stimulator trial in past by Dr. Alvester Morin  Surgery denies prior spine surgery  Goals for pain management: To be more active and have decreased pain   Pain is going down his left leg to his knee   Prior UDS results:     Component Value Date/Time   LABOPIA NEGATIVE 03/17/2012 0133   COCAINSCRNUR NEGATIVE 03/17/2012 0133   LABBENZ POSITIVE (A) 03/17/2012 0133   AMPHETMU NEGATIVE 03/17/2012 0133    Interval history 11/12/2022 Brandon [name] is here for follow-up regarding his chronic low back pain.  He had bilateral T12, L1, L2 medial branch blocks completed on 10/27/2022 by Dr. Wynn Banker.  Patient reports he did not have significant benefit after his procedure.  Patient reports he tried to go to aquatic therapy, however patient had incident with staff and left without being seen.  Pain overall unchanged since last visit.  He has not tried Lyrica yet, has not picked up from the pharmacy.  Pain Inventory Average Pain 8 Pain Right Now 8 My pain is aching  In the last 24 hours, has pain interfered with the following? General activity 8 Relation with others 9 Enjoyment of life 9 What TIME of day is your pain at its worst? daytime Sleep (in general) Poor  Pain is worse with: walking and standing Pain improves with: heat/ice and therapy/exercise Relief from Meds: 0     Family History  Problem Relation Age of Onset   Cancer Father        bone   Heart failure Mother    Hypertension Mother    Asthma Mother    Cancer Paternal Grandmother        ovarian   CVA Other        Fam Hx of multiple myeloma   Diabetes Other        Fam Hx of DM   Social History   Socioeconomic History   Marital status: Married    Spouse name: Not on file   Number of children: 2   Years of education: college   Highest education level: Bachelor's degree (e.g., BA, AB, BS)  Occupational History   Not on file  Tobacco Use    Smoking status: Former    Current packs/day: 0.00    Average packs/day: 0.5 packs/day for 5.0 years (2.5 ttl pk-yrs)    Types: Cigarettes    Start date: 26    Quit date: 10    Years since quitting: 48.8   Smokeless tobacco: Never   Tobacco comments:    Former smoker 03/27/22  Vaping Use   Vaping status: Never Used  Substance and Sexual Activity   Alcohol use: No    Comment: Former EtOH abuse, stopped 06/2016   Drug use: No    Comment: negative hx for IV drug abuse   Sexual activity: Not on file  Other Topics Concern   Not on file  Social History Narrative   Lives with wife in [place]   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   Prior Hotel manager   Wife lives in town (still married but lives separate)   Has biological daughter in [place] ([place])   Social Determinants of  Health   Financial Resource Strain: Low Risk  (06/09/2022)   Overall Financial Resource Strain (CARDIA)    Difficulty of Paying Living Expenses: Not hard at all  Food Insecurity: No Food Insecurity (06/09/2022)   Hunger Vital Sign    Worried About Running Out of Food in the Last Year: Never true    Ran Out of Food in the Last Year: Never true  Transportation Needs: No Transportation Needs (06/09/2022)   PRAPARE - Administrator, Civil Service (Medical): No    Lack of Transportation (Non-Medical): No  Physical Activity: Inactive (06/09/2022)   Exercise Vital Sign    Days of Exercise per Week: 0 days    Minutes of Exercise per Session: 0 min  Stress: Stress Concern Present (06/09/2022)   [employer] of Occupational Health - Occupational Stress Questionnaire    Feeling of Stress : Rather much  Social Connections: Socially Isolated (06/09/2022)   Social Connection and Isolation Panel [NHANES]    Frequency of Communication with Friends and Family: Once a week    Frequency of Social Gatherings with Friends and Family: Never    Attends Religious Services: Never    Database administrator or Organizations: No    Attends  Engineer, structural: 1 to 4 times per year    Marital Status: Separated   Past Surgical History:  Procedure Laterality Date   ATRIAL FIBRILLATION ABLATION N/A 02/27/2022   Procedure: ATRIAL FIBRILLATION ABLATION;  Surgeon: Nelly Laurence, Roberts Gaudy, MD;  [facility]: [facility] INVASIVE [facility] LAB;  Service: Cardiovascular;  Laterality: N/A;   CARDIOVERSION N/A 03/21/2012   Procedure: CARDIOVERSION;  Surgeon: Ricki Rodriguez, MD;  Location: [facility] ENDOSCOPY;  Service: Cardiovascular;  Laterality: N/A;   CARDIOVERSION N/A 04/24/2013   Procedure: CARDIOVERSION;  Surgeon: Lars Masson, MD;  Location: [facility] ENDOSCOPY;  Service: Cardiovascular;  Laterality: N/A;   CARDIOVERSION N/A 06/27/2015   Procedure: CARDIOVERSION;  Surgeon: Laurey Morale, MD;  Location: [facility] ENDOSCOPY;  Service: Cardiovascular;  Laterality: N/A;   CARDIOVERSION N/A 11/11/2021   Procedure: CARDIOVERSION;  Surgeon: Lewayne Bunting, MD;  Location: [facility] ENDOSCOPY;  Service: Cardiovascular;  Laterality: N/A;   CARDIOVERSION N/A 05/11/2022   Procedure: CARDIOVERSION;  Surgeon: Christell Constant, MD;  [facility]: [facility];  Service: Cardiovascular;  Laterality: N/A;   CARDIOVERSION N/A 07/13/2022   Procedure: CARDIOVERSION;  Surgeon: Thurmon Fair, MD;  [facility]: [facility];  Service: Cardiovascular;  Laterality: N/A;   CATARACT EXTRACTION     COLONOSCOPY W/ POLYPECTOMY     ELECTROPHYSIOLOGIC STUDY N/A 07/30/2015   Procedure: Atrial Fibrillation Ablation;  Surgeon: Hillis Range, MD;  Location: [facility] INVASIVE CV LAB;  Service: Cardiovascular;  Laterality: N/A;   EXTRACORPOREAL SHOCK WAVE LITHOTRIPSY Right 10/29/2016   Procedure: RIGHT EXTRACORPOREAL SHOCK WAVE LITHOTRIPSY (ESWL);  Surgeon: Sebastian Ache, MD;  Location: [facility] ORS;  Service: Urology;  Laterality: Right;   EYE SURGERY Right    cataract   FEMUR FRACTURE SURGERY     HERNIA REPAIR  07/06/2011   INGUINAL HERNIA REPAIR Right 07/28/2013   Procedure: RIGHT INGUINAL HERNIA  REPAIR;  Surgeon: Wilmon Arms. Corliss Skains, MD;  Location: [facility] OR;  Service: General;  Laterality: Right;   INGUINAL HERNIA REPAIR Right 09/22/2016   Procedure: LAPAROSCOPIC RIGHT INGUINAL HERNIA;  Surgeon: Sheliah Hatch De Blanch, MD;  Location: [facility] ORS;  Service: General;  Laterality: Right;  With MESH   INSERTION OF MESH Right 07/28/2013   Procedure: INSERTION OF MESH;  Surgeon: Wilmon Arms.  Corliss Skains, MD;  Location: [facility] OR;  Service: General;  Laterality: Right;   KNEE ARTHROSCOPY     left   REPLACEMENT TOTAL KNEE Left    ROTATOR CUFF REPAIR     right   ROTATOR CUFF REPAIR Left 03/08/2014   DR SUPPLE   SHOULDER ARTHROSCOPY WITH ROTATOR CUFF REPAIR AND SUBACROMIAL DECOMPRESSION Left 03/08/2014   Procedure: LEFT SHOULDER ARTHROSCOPY WITH ROTATOR CUFF REPAIR/SUBACROMIAL DECOMPRESSION/DISTAL CLAVICLE RESECTION;  Surgeon: Senaida Lange, MD;  Location: [facility] OR;  Service: Orthopedics;  Laterality: Left;   SVT ABLATION N/A 01/23/2019   Procedure: SVT ABLATION;  Surgeon: Marinus Maw, MD;  Location: [facility] INVASIVE CV LAB;  Service: Cardiovascular;  Laterality: N/A;   TEE WITHOUT CARDIOVERSION N/A 03/21/2012   Procedure: TRANSESOPHAGEAL ECHOCARDIOGRAM (TEE);  Surgeon: Ricki Rodriguez, MD;  Location: [facility] ENDOSCOPY;  Service: Cardiovascular;  Laterality: N/A;   TEE WITHOUT CARDIOVERSION N/A 04/24/2013   Procedure: TRANSESOPHAGEAL ECHOCARDIOGRAM (TEE);  Surgeon: Lars Masson, MD;  Location: [facility] ENDOSCOPY;  Service: Cardiovascular;  Laterality: N/A;   TEE WITHOUT CARDIOVERSION N/A 07/29/2015   Procedure: TRANSESOPHAGEAL ECHOCARDIOGRAM (TEE);  Surgeon: Pricilla Riffle, MD;  Location: [facility] ENDOSCOPY;  Service: Cardiovascular;  Laterality: N/A;   TEE WITHOUT CARDIOVERSION N/A 05/11/2022   Procedure: TRANSESOPHAGEAL ECHOCARDIOGRAM;  Surgeon: Christell Constant, MD;  [facility]AS[facility] LAB;  Service: Cardiovascular;  Laterality: N/A;   TONSILLECTOMY     TOTAL KNEE ARTHROPLASTY Right 05/24/2018   Procedure: RIGHT TOTAL KNEE  ARTHROPLASTY;  Surgeon: Durene Romans, MD;  Location: [facility] ORS;  Service: Orthopedics;  Laterality: Right;  70 mins   Past Medical History:  Diagnosis Date   Anxiety    Arthritis    Benign neoplasm of colon    Benign prostatic hyperplasia with urinary obstruction    Bilateral inguinal hernia 05/29/2011   Bilateral pulmonary embolism (HCC) 3/14   admitted to [facility],  treated with Xarelto   Breast lump 03/29/2019   Calculus of kidney 10/21/2016   Cataract    Chronic bilateral low back pain with sciatica 03/21/2012   Chronic pain    Finger mass, right 10/17/2020   History of alcohol abuse 03/07/2016   History of colon polyps 11/18/2015   History of kidney stones    History of pulmonary embolism 03/19/2012   Hyperlipemia    Hypertension    Insomnia    Insomnia due to medical condition 12/23/2016   Polyuria secondary to BPH   Major depressive disorder, recurrent episode (HCC)    Mitral regurgitation 04/24/2013   Mild by TEE   Morbid obesity (HCC) 03/07/2016   Nonrheumatic mitral valve insufficiency 04/25/2013   Mild by TEE  Overview:  Overview:  Mild by TEE Overview:  Overview:  Overview:  Mild by TEE Overview:  Overview:  Mild by TEE   Obesity    OSA (obstructive sleep apnea)    noncompliant with CPAP.  07/27/13- awaiting a CPAP- unable to tolerate mask   Osteoporosis    Persistent atrial fibrillation (HCC)    Recurrent unilateral inguinal hernia 07/17/2013   Restless leg syndrome    takes gabapentin   S/P right total knee arthroplasty 05/24/2018   Sciatica    Spinal stenosis, lumbar region with neurogenic claudication    SVT (supraventricular tachycardia) (HCC) 03/07/2016   Thrombus of left atrial appendage 03/09/2013   Uncomplicated asthma 06/20/2008   Overview:  Overview:  Overview:  Overview:  Qualifier: Diagnosis of  By: Tawanna Cooler MD, Eugenio Hoes Overview:  Overview:  Qualifier:  Diagnosis of  By: Tawanna Cooler MD, Eugenio Hoes   Ventral hernia, unspecified, without mention of obstruction or gangrene     right abdominal wall   BP 133/88   Pulse 71   Ht 6\' 4"  (1.93 m)   Wt 286 lb 3.2 oz (129.8 kg)   SpO2 92%   BMI 34.84 kg/m   Opioid Risk Score:   Fall Risk Score:  `1  Depression screen [facility] 2/9     11/12/2022    9:47 AM 10/29/2022   10:21 AM 09/22/2022   10:43 AM 06/09/2022   11:33 AM 02/12/2022    2:21 PM 12/10/2021   10:55 AM 06/20/2021    9:08 AM  Depression screen PHQ 2/9  Decreased Interest 1  1 0 1 0 0  Down, Depressed, Hopeless 1 1 1  0 1 0 0  PHQ - 2 Score 2 1 2  0 2 0 0  Altered sleeping  1 0 0 3  1  Tired, decreased energy   2 0 3  1  Change in appetite   2 0 2  0  Feeling bad or failure about yourself    3 0 0  0  Trouble concentrating   2 0 1  0  Moving slowly or fidgety/restless   2 0 0  0  Suicidal thoughts   1 0 0  0  PHQ-9 Score  2 14 0 11  2  Difficult doing work/chores   Very difficult  Somewhat difficult        Review of Systems  Constitutional: Negative.   HENT: Negative.    Eyes: Negative.   Respiratory: Negative.    Cardiovascular: Negative.   Gastrointestinal: Negative.   Endocrine: Negative.   Genitourinary: Negative.   Musculoskeletal:  Positive for back pain and gait problem.  Skin: Negative.   Allergic/Immunologic: Negative.   Hematological: Negative.   Psychiatric/Behavioral:  Positive for dysphoric mood.   All other systems reviewed and are negative.      Objective:   Physical Exam  Gen: no distress, normal appearing HEENT: oral mucosa pink and moist, NCAT Cardio: Reg rate Chest: normal effort, normal rate of breathing Abd: soft, non-distended Ext: no edema Psych: Tearful today in discussing PT and has pain Skin: intact Neuro: Alert and oriented, follows commands, cranial nerves II through XII grossly intact, normal speech and language RUE: 5/5 Deltoid, 5/5 Biceps, 5/5 Triceps, 5/5 Wrist Ext, 5/5 Grip LUE: 5/5 Deltoid, 5/5 Biceps, 5/5 Triceps, 5/5 Wrist Ext, 5/5 Grip RLE: HF 5/5, KE 5/5,ADF 5/5, APF 5/5 LLE: HF 5/5, KE 5/5,  ADF 5/5, APF 5/5 Sensory exam normal for light touch and pain in all 4 limbs.  No ankle clonus DTR absent bilateral patella and right ankle, DTR 1+ left ankle- not checked today  Musculoskeletal:  Tenderness to palpation about bilateral paraspinal muscles in the lumbar spine No significant cervical paraspinal tenderness Kyphotic posture Slump test negative bilaterally Fairly good spinal full forward flexion.  Significant pain can pain with extension Facet loading positive  MRI from [facility] note 05/14/22 Lumbar MRI 05/13/22: Anatomical variants: none Alignment: Degenerative levoscoliosis. Conus medullaris: terminates at approximately L1. Spinal Cord and Cauda Equina: Visualized portions of the spinal cord is normal in morphology and signal. Normal appearance of the cauda equina.  Bone marrow signal: No suspicious lesions.  T12-L1: Mild disc and facet degenerative changes resulting in mild spinal canal stenosis.  L1-L2: Unremarkable.  L2-L3: Mild disc and facet degenerative changes without significant spinal canal or  neural foraminal stenosis.  L3-L4: Disc bulge and facet arthropathy resulting in mild canal stenosis.  L4-L5: Mild disc and facet degenerative changes resulting in mild canal stenosis.  L5-S1: Advanced bilateral facet arthropathy and mild disc degenerative changes resulting in mild left neural foraminal stenosis.  IMPRESSION: Multilevel disc and facet degenerative changes above without high-grade stenosis.   L spine Xray from [facility] note  Spine x-rays 04/01/22: IMPRESSION: 1. S-shaped scoliosis of the thoracolumbar spine with positive sagittal and coronal imbalance. 2. Mild stepwise retrolisthesis from L2 to L5 without evidence of dynamic instability. 3. 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     Assessment & Plan:   1) Chronic low back pain with degenerative spinal canal stenosis and multilevel lumbar facet joint  degeneration  2) Depression, denies SI or HI. On SSRI   -Discussed foods that are helpful for pain -DC gabapentin and start Lyrica 100 mg 3 times daily.  This was done last visit.  Patient reports he has not tried this yet.  He is agreeable to try this now. -Initially plan to refer to Dr. Wynn Banker for [facility] as patient indicated this was helpful in the past.  On chart review it appears there is some degeneration in upper facet joints of the lumbar spine, will discuss possibility of medial branch block/RFA to upper lumbar spine.  Lower lumbar spine RFA was completed previously by Dr. Alvester Morin.  Patient was referred for medial branch block of upper lumbar spine.  Medial branch block bilateral T12, L1-L2 on 10/24 without significant benefit reported. -Referral placed to psychiatry for medication management due to continued depression -Consult placed last visit for aquatic therapy-patient has found working in the pool to be most helpful for his back previously-patient reports he would like to do this but had disagreement with staff when he went to the session.  Patient says he feels bad about this and is not ready to go back just yet.  He says maybe in a few months he will try this again.  Encouraged him to try again however he says he does not want to do this at this time.  Discussed we could try at a different location but he is not interested at this time.

## 2022-11-14 ENCOUNTER — Encounter: Payer: Self-pay | Admitting: Internal Medicine

## 2022-11-14 ENCOUNTER — Other Ambulatory Visit: Payer: Self-pay | Admitting: Internal Medicine

## 2022-11-14 ENCOUNTER — Encounter: Payer: Self-pay | Admitting: Physical Medicine & Rehabilitation

## 2022-11-15 ENCOUNTER — Emergency Department (HOSPITAL_COMMUNITY): Payer: Medicare Other

## 2022-11-15 ENCOUNTER — Other Ambulatory Visit: Payer: Self-pay

## 2022-11-15 ENCOUNTER — Encounter (HOSPITAL_COMMUNITY): Payer: Self-pay | Admitting: *Deleted

## 2022-11-15 ENCOUNTER — Emergency Department (HOSPITAL_COMMUNITY)
Admission: EM | Admit: 2022-11-15 | Discharge: 2022-11-16 | Disposition: A | Discharge status: Home / Self Care | Payer: Medicare Other | Attending: Emergency Medicine | Admitting: Emergency Medicine

## 2022-11-15 DIAGNOSIS — N200 Calculus of kidney: Secondary | ICD-10-CM | POA: Diagnosis not present

## 2022-11-15 DIAGNOSIS — S42021A Displaced fracture of shaft of right clavicle, initial encounter for closed fracture: Secondary | ICD-10-CM | POA: Diagnosis not present

## 2022-11-15 DIAGNOSIS — R197 Diarrhea, unspecified: Secondary | ICD-10-CM | POA: Diagnosis not present

## 2022-11-15 DIAGNOSIS — Z1152 Encounter for screening for COVID-19: Secondary | ICD-10-CM | POA: Diagnosis not present

## 2022-11-15 DIAGNOSIS — R0902 Hypoxemia: Secondary | ICD-10-CM | POA: Diagnosis not present

## 2022-11-15 DIAGNOSIS — R002 Palpitations: Secondary | ICD-10-CM | POA: Diagnosis present

## 2022-11-15 DIAGNOSIS — I499 Cardiac arrhythmia, unspecified: Secondary | ICD-10-CM | POA: Diagnosis not present

## 2022-11-15 DIAGNOSIS — I491 Atrial premature depolarization: Secondary | ICD-10-CM | POA: Diagnosis not present

## 2022-11-15 DIAGNOSIS — R112 Nausea with vomiting, unspecified: Secondary | ICD-10-CM | POA: Diagnosis not present

## 2022-11-15 DIAGNOSIS — R4781 Slurred speech: Secondary | ICD-10-CM | POA: Diagnosis not present

## 2022-11-15 DIAGNOSIS — K5730 Diverticulosis of large intestine without perforation or abscess without bleeding: Secondary | ICD-10-CM | POA: Diagnosis not present

## 2022-11-15 DIAGNOSIS — I4891 Unspecified atrial fibrillation: Secondary | ICD-10-CM | POA: Diagnosis not present

## 2022-11-15 DIAGNOSIS — R42 Dizziness and giddiness: Secondary | ICD-10-CM | POA: Diagnosis not present

## 2022-11-15 DIAGNOSIS — J9811 Atelectasis: Secondary | ICD-10-CM | POA: Diagnosis not present

## 2022-11-15 DIAGNOSIS — R0989 Other specified symptoms and signs involving the circulatory and respiratory systems: Secondary | ICD-10-CM | POA: Diagnosis not present

## 2022-11-15 DIAGNOSIS — Z7901 Long term (current) use of anticoagulants: Secondary | ICD-10-CM | POA: Diagnosis not present

## 2022-11-15 DIAGNOSIS — R109 Unspecified abdominal pain: Secondary | ICD-10-CM | POA: Diagnosis not present

## 2022-11-15 DIAGNOSIS — E876 Hypokalemia: Secondary | ICD-10-CM | POA: Diagnosis not present

## 2022-11-15 LAB — SARS CORONAVIRUS 2 BY RT PCR: SARS Coronavirus 2 by RT PCR: NEGATIVE

## 2022-11-15 LAB — CBC
HCT: 43.8 % (ref 39.0–52.0)
Hemoglobin: 14.9 g/dL (ref 13.0–17.0)
MCH: 29.8 pg (ref 26.0–34.0)
MCHC: 34 g/dL (ref 30.0–36.0)
MCV: 87.6 fL (ref 80.0–100.0)
Platelets: 176 10*3/uL (ref 150–400)
RBC: 5 MIL/uL (ref 4.22–5.81)
RDW: 12.9 % (ref 11.5–15.5)
WBC: 9.3 10*3/uL (ref 4.0–10.5)
nRBC: 0 % (ref 0.0–0.2)

## 2022-11-15 LAB — HEPATIC FUNCTION PANEL
ALT: 13 U/L (ref 0–44)
AST: 16 U/L (ref 15–41)
Albumin: 3.8 g/dL (ref 3.5–5.0)
Alkaline Phosphatase: 74 U/L (ref 38–126)
Bilirubin, Direct: 0.2 mg/dL (ref 0.0–0.2)
Indirect Bilirubin: 0.9 mg/dL (ref 0.3–0.9)
Total Bilirubin: 1.1 mg/dL (ref <1.2)
Total Protein: 7.1 g/dL (ref 6.5–8.1)

## 2022-11-15 LAB — BASIC METABOLIC PANEL
Anion gap: 12 (ref 5–15)
BUN: 9 mg/dL (ref 8–23)
CO2: 23 mmol/L (ref 22–32)
Calcium: 9.1 mg/dL (ref 8.9–10.3)
Chloride: 100 mmol/L (ref 98–111)
Creatinine, Ser: 0.74 mg/dL (ref 0.61–1.24)
GFR, Estimated: >60 mL/min (ref >60)
Glucose, Bld: 153 mg/dL — ABNORMAL HIGH (ref 70–99)
Potassium: 3.1 mmol/L — ABNORMAL LOW (ref 3.5–5.1)
Sodium: 135 mmol/L (ref 135–145)

## 2022-11-15 LAB — MAGNESIUM: Magnesium: 1.9 mg/dL (ref 1.7–2.4)

## 2022-11-15 LAB — LIPASE, BLOOD: Lipase: 22 U/L (ref 11–51)

## 2022-11-15 LAB — TROPONIN I (HIGH SENSITIVITY): Troponin I (High Sensitivity): 23 ng/L — ABNORMAL HIGH (ref <18)

## 2022-11-15 MED ORDER — DILTIAZEM HCL-DEXTROSE 125-5 MG/125ML-% IV SOLN (PREMIX)
5.0000 mg/h | INTRAVENOUS | Status: DC
  Administered 2022-11-15: 5 mg/h via INTRAVENOUS
  Filled 2022-11-15: qty 125

## 2022-11-15 MED ORDER — DILTIAZEM LOAD VIA INFUSION
15.0000 mg | Freq: Once | INTRAVENOUS | Status: AC
  Administered 2022-11-15: 15 mg via INTRAVENOUS
  Filled 2022-11-15: qty 15

## 2022-11-15 MED ORDER — POTASSIUM CHLORIDE 10 MEQ/100ML IV SOLN
10.0000 meq | INTRAVENOUS | Status: AC
  Administered 2022-11-16 (×2): 10 meq via INTRAVENOUS
  Filled 2022-11-15 (×2): qty 100

## 2022-11-15 MED ORDER — SODIUM CHLORIDE 0.9 % IV BOLUS
1000.0000 mL | Freq: Once | INTRAVENOUS | Status: AC
  Administered 2022-11-15: 1000 mL via INTRAVENOUS

## 2022-11-15 MED ORDER — ONDANSETRON HCL 4 MG/2ML IJ SOLN
4.0000 mg | Freq: Once | INTRAMUSCULAR | Status: AC
  Administered 2022-11-15: 4 mg via INTRAVENOUS
  Filled 2022-11-15: qty 2

## 2022-11-15 MED ORDER — LORAZEPAM 2 MG/ML IJ SOLN
1.0000 mg | Freq: Once | INTRAMUSCULAR | Status: AC
  Administered 2022-11-15: 1 mg via INTRAVENOUS
  Filled 2022-11-15: qty 1

## 2022-11-15 NOTE — ED Triage Notes (Addendum)
Pt arrives by ems from home.  He has been been feeling unwell all day, per ems he feel warm to touch.  Pt was found to be in afib with rate 80-130.  No CP or sob.  Pt is on elaquis for afib. CBG for ems 155.

## 2022-11-15 NOTE — ED Notes (Signed)
Lab called to add additional labs to blood already sent

## 2022-11-15 NOTE — ED Provider Notes (Signed)
1:58 AM Assumed care from Dr. Renaye Rakers, please see their note for full history, physical and decision making until this point. In brief this is a 72 y.o. year old male who presented to the ED tonight with Atrial Fibrillation     Some type of GI illness throughout the day and now back in Afib, no RVR. Pending ct abdomen for symptoms. Sounds like his Afib is pretty difficult to control and has not responded to cardioversion in past, so not really an option.   Potassium found to be low. Normal Magnesium. Potassium oral/iv initiated. Feeling better overall. Pending ct.   Ct states intussusception however patient has no abdominal pain at this time and had diarrhea earlier so suspect this is a transient finding.  Still passing gas.  His hypokalemia is likely related to the vomiting and is probably causing him to be A-fib however now he looks to be a sinus rhythm.  Repeat EKG shows the same.  Patient had oral potassium here about half his bag of IV potassium but pulled out his IV and did not want another one placed which I think is appropriate since he is back in sinus rhythm.  Is on flecainide at home so we will continue that tomorrow and increase his potassium orally at home.  Zofran prescribed.  Patient will follow-up with PCP if not improving here if things worsen and make sure his cardiologist knows that he was here for this situation.  Otherwise patient stable for discharge.   EKG Interpretation Date/Time:    Ventricular Rate:    PR Interval:    QRS Duration:    QT Interval:    QTC Calculation:   R Axis:      Text Interpretation:           Discharge instructions, including strict return precautions for new or worsening symptoms, given. Patient and/or family verbalized understanding and agreement with the plan as described.   Labs, studies and imaging reviewed by myself and considered in medical decision making if ordered. Imaging interpreted by radiology.  Labs Reviewed  BASIC METABOLIC  PANEL - Abnormal; Notable for the following components:      Result Value   Potassium 3.1 (*)    Glucose, Bld 153 (*)    All other components within normal limits  URINALYSIS, W/ REFLEX TO CULTURE (INFECTION SUSPECTED) - Abnormal; Notable for the following components:   Hgb urine dipstick SMALL (*)    Ketones, ur 5 (*)    Protein, ur 30 (*)    Leukocytes,Ua TRACE (*)    Bacteria, UA FEW (*)    All other components within normal limits  TROPONIN I (HIGH SENSITIVITY) - Abnormal; Notable for the following components:   Troponin I (High Sensitivity) 23 (*)    All other components within normal limits  TROPONIN I (HIGH SENSITIVITY) - Abnormal; Notable for the following components:   Troponin I (High Sensitivity) 25 (*)    All other components within normal limits  SARS CORONAVIRUS 2 BY RT PCR  CBC  MAGNESIUM  HEPATIC FUNCTION PANEL  LIPASE, BLOOD    CT ABDOMEN PELVIS W CONTRAST  Final Result    DG Chest Port 1 View  Final Result      No follow-ups on file.    Marily Memos, MD 11/16/22 [id number]

## 2022-11-15 NOTE — ED Provider Notes (Signed)
[place] EMERGENCY DEPARTMENT AT [facility] Provider Note   CSN: [id number] Arrival date & time: 11/15/22  2108     History  Chief Complaint  Patient presents with   Atrial Fibrillation    Brandon [name]. is a 72 y.o. male with a history of persistent A-fib, AVNRT status post ablation, on Eliquis, presented to ED with complaint of nausea, vomiting, and palpitations.  Patient reports that he had a dinner last night including a homemade sauce that he said "tasted funny".  He says he woke up this morning with nausea, vomiting and diarrhea, which is been persistent all day.  He noted this heart rate was elevated when he checked he was "back in A-fib".  He reports that he has generally been out of A-fib since it was fixed recently.  He denies chest pain.  Medical records from electro cardiology team in October report history of recurring A-fib, unsuccessful cardioversions, patient being maintained on flecainide.   HPI     Home Medications Prior to Admission medications   Medication Sig Start Date End Date Taking? Authorizing Provider  amLODipine (NORVASC) 5 MG tablet Take 1 tablet (5 mg total) by mouth daily. 10/31/21  Yes Sheilah Pigeon, PA-C  atorvastatin (LIPITOR) 40 MG tablet TAKE 1 TABLET BY MOUTH EVERY DAY 09/07/22  Yes Etta Grandchild, MD  carvedilol (COREG) 25 MG tablet TAKE 1 TABLET (25 MG TOTAL) BY MOUTH TWICE A DAY WITH MEALS 06/02/22  Yes Marinus Maw, MD  dutasteride (AVODART) 0.5 MG capsule Take 0.5 mg by mouth daily. 03/27/21  Yes [provider]  ELIQUIS 5 MG TABS tablet TAKE 1 TABLET BY MOUTH TWICE A DAY 06/10/22  Yes Marinus Maw, MD  escitalopram (LEXAPRO) 20 MG tablet Take 1 tablet (20 mg total) by mouth daily. 10/10/22  Yes Etta Grandchild, MD  flecainide (TAMBOCOR) 50 MG tablet Take 1 tablet (50 mg total) by mouth 2 (two) times daily. 06/16/22  Yes Mealor, Roberts Gaudy, MD  fluticasone (FLONASE) 50 MCG/ACT nasal spray Place 2 sprays into both  nostrils daily. Patient taking differently: Place 2 sprays into both nostrils daily as needed for allergies. 02/12/22  Yes Etta Grandchild, MD  KLOR-CON M20 20 MEQ tablet TAKE 1 TABLET BY MOUTH EVERY DAY 08/25/22  Yes Etta Grandchild, MD  memantine (NAMENDA) 10 MG tablet TAKE 1 TABLET BY MOUTH TWICE A DAY 08/25/22  Yes Etta Grandchild, MD  pregabalin (LYRICA) 100 MG capsule Take 1 capsule (100 mg total) by mouth 3 (three) times daily. 09/22/22  Yes Fanny Dance, MD  spironolactone (ALDACTONE) 25 MG tablet TAKE 1 TABLET (25 MG TOTAL) BY MOUTH DAILY. 08/25/22  Yes Etta Grandchild, MD  tamsulosin (FLOMAX) 0.4 MG CAPS capsule TAKE 1 CAPSULE BY MOUTH TWICE A DAY 08/25/22  Yes Etta Grandchild, MD  triamcinolone cream (KENALOG) 0.1 % Apply 1 Application topically 2 (two) times daily as needed (rash).   Yes [provider]      Allergies    Ace inhibitors and Albuterol    Review of Systems   Review of Systems  Physical Exam Updated Vital Signs BP (!) 144/117   Pulse (!) 50   Temp 99.5 F (37.5 C) (Oral)   Resp (!) 26   SpO2 95%  Physical Exam Constitutional:      General: He is not in acute distress. HENT:     Head: Normocephalic and atraumatic.  Eyes:     Conjunctiva/sclera:  Conjunctivae normal.     Pupils: Pupils are equal, round, and reactive to light.  Cardiovascular:     Rate and Rhythm: Tachycardia present. Rhythm irregular.  Pulmonary:     Effort: Pulmonary effort is normal. No respiratory distress.  Abdominal:     General: There is no distension.     Tenderness: There is no abdominal tenderness.  Skin:    General: Skin is warm and dry.  Neurological:     General: No focal deficit present.     Mental Status: He is alert. Mental status is at baseline.  Psychiatric:        Mood and Affect: Mood normal.        Behavior: Behavior normal.     ED Results / Procedures / Treatments   Labs (all labs ordered are listed, but only abnormal results are displayed) Labs  Reviewed  BASIC METABOLIC PANEL - Abnormal; Notable for the following components:      Result Value   Potassium 3.1 (*)    Glucose, Bld 153 (*)    All other components within normal limits  TROPONIN I (HIGH SENSITIVITY) - Abnormal; Notable for the following components:   Troponin I (High Sensitivity) 23 (*)    All other components within normal limits  SARS CORONAVIRUS 2 BY RT PCR  CBC  MAGNESIUM  HEPATIC FUNCTION PANEL  LIPASE, BLOOD  TROPONIN I (HIGH SENSITIVITY)    EKG None  Radiology DG Chest Port 1 View  Result Date: 11/15/2022 CLINICAL DATA:  Atrial fibrillation. EXAM: PORTABLE CHEST 1 VIEW COMPARISON:  June 09, 2022 FINDINGS: The heart size and mediastinal contours are within normal limits. Low lung volumes are noted. Mild atelectasis is seen within the bilateral lung bases. No pleural effusion or pneumothorax is identified. A chronic fracture deformity is seen involving the mid right clavicle. Multilevel degenerative changes seen throughout the thoracic spine. IMPRESSION: Low lung volumes with mild bibasilar atelectasis. Electronically Signed   By: Aram Candela M.D.   On: 11/15/2022 22:28    Procedures .Critical Care  Performed by: Terald Sleeper, MD Authorized by: Terald Sleeper, MD   Critical care provider statement:    Critical care time (minutes):  30   Critical care time was exclusive of:  Separately billable procedures and treating other patients   Critical care was necessary to treat or prevent imminent or life-threatening deterioration of the following conditions:  Circulatory failure   Critical care was time spent personally by me on the following activities:  Ordering and performing treatments and interventions, ordering and review of laboratory studies, ordering and review of radiographic studies, pulse oximetry, review of old charts, examination of patient and evaluation of patient's response to treatment Comments:     Rate control for A-fib,  hypokalemia replacement     Medications Ordered in ED Medications  diltiazem (CARDIZEM) 1 mg/mL load via infusion 15 mg (15 mg Intravenous Bolus from Bag 11/15/22 2306)    And  diltiazem (CARDIZEM) 125 mg in dextrose 5% 125 mL (1 mg/mL) infusion (5 mg/hr Intravenous New Bag/Given 11/15/22 2308)  potassium chloride 10 mEq in 100 mL IVPB (has no administration in time range)  sodium chloride 0.9 % bolus 1,000 mL (1,000 mLs Intravenous New Bag/Given 11/15/22 2151)  ondansetron (ZOFRAN) injection 4 mg (4 mg Intravenous Given 11/15/22 2154)  LORazepam (ATIVAN) injection 1 mg (1 mg Intravenous Given 11/15/22 2242)    ED Course/ Medical Decision Making/ A&P Clinical Course as of 11/15/22 2336  [facility] Nov 15, 2022  2258 Pt sitting up on edge of bed, extremely anxious, stating he can't sit in room, claustrophobic and nauseous.  We discussed some ativan as an anxiolytic.  Needing to get CT abdomen performed.  HR A Fib 100's, will try diltiazem bolus [MT]    Clinical Course User Index [MT] Lashun Mccants, Kermit Balo, MD                                 Medical Decision Making Amount and/or Complexity of Data Reviewed Labs: ordered. Radiology: ordered.  Risk Prescription drug management.   This patient presents to the ED with concern for palpitations, n/v/diarrhea. This involves an extensive number of treatment options, and is a complaint that carries with it a high risk of complications and morbidity.  The differential diagnosis includes viral gastroenteritis or foodborne illness versus A-fib with RVR versus other  Co-morbidities that complicate the patient evaluation: History of A-fib at high risk of exacerbation   External records from outside source obtained and reviewed including cardiology evaluation in office  I ordered and personally interpreted labs.  The pertinent results include: Mild hypokalemia, troponin very mildly elevated  I ordered imaging studies including x-ray chest, CT abdomen  - pending at the time of signout  The patient was maintained on a cardiac monitor.  I personally viewed and interpreted the cardiac monitored which showed an underlying rhythm of: A Fib HR 100's  Per my interpretation the patient's ECG shows A-fib with mild tachycardia  I ordered medication including diltiazem, potassium, Ativan, Zofran, fluids  I have reviewed the patients home medicines and have made adjustments as needed  Test Considered: doubt acute PE clinically  After the interventions noted above, I reevaluated the patient and found that they have: stayed the same   Patient signed out to Dr Marily Memos edp pending follow up on CT imaging and reassessment of heart rate        Final Clinical Impression(s) / ED Diagnoses Final diagnoses:  Nausea vomiting and diarrhea  Atrial fibrillation, unspecified type [facility])    Rx / DC Orders ED Discharge Orders     None         Terald Sleeper, MD 11/15/22 2336

## 2022-11-16 ENCOUNTER — Other Ambulatory Visit: Payer: Self-pay

## 2022-11-16 DIAGNOSIS — R109 Unspecified abdominal pain: Secondary | ICD-10-CM | POA: Diagnosis not present

## 2022-11-16 DIAGNOSIS — F331 Major depressive disorder, recurrent, moderate: Secondary | ICD-10-CM

## 2022-11-16 DIAGNOSIS — I4891 Unspecified atrial fibrillation: Secondary | ICD-10-CM | POA: Diagnosis not present

## 2022-11-16 DIAGNOSIS — K5730 Diverticulosis of large intestine without perforation or abscess without bleeding: Secondary | ICD-10-CM | POA: Diagnosis not present

## 2022-11-16 DIAGNOSIS — N200 Calculus of kidney: Secondary | ICD-10-CM | POA: Diagnosis not present

## 2022-11-16 LAB — URINALYSIS, W/ REFLEX TO CULTURE (INFECTION SUSPECTED)
Bilirubin Urine: NEGATIVE
Glucose, UA: NEGATIVE mg/dL
Ketones, ur: 5 mg/dL — AB
Nitrite: NEGATIVE
Protein, ur: 30 mg/dL — AB
Specific Gravity, Urine: 1.019 (ref 1.005–1.030)
pH: 7 (ref 5.0–8.0)

## 2022-11-16 LAB — TROPONIN I (HIGH SENSITIVITY): Troponin I (High Sensitivity): 25 ng/L — ABNORMAL HIGH (ref <18)

## 2022-11-16 MED ORDER — IOHEXOL 350 MG/ML SOLN
75.0000 mL | Freq: Once | INTRAVENOUS | Status: AC | PRN
  Administered 2022-11-16: 75 mL via INTRAVENOUS

## 2022-11-16 MED ORDER — POTASSIUM CHLORIDE CRYS ER 20 MEQ PO TBCR
40.0000 meq | EXTENDED_RELEASE_TABLET | Freq: Once | ORAL | Status: AC
  Administered 2022-11-16: 40 meq via ORAL
  Filled 2022-11-16: qty 2

## 2022-11-16 MED ORDER — LORAZEPAM 2 MG/ML IJ SOLN
1.0000 mg | Freq: Once | INTRAMUSCULAR | Status: AC
  Administered 2022-11-16: 1 mg via INTRAVENOUS
  Filled 2022-11-16: qty 1

## 2022-11-16 MED ORDER — ONDANSETRON 4 MG PO TBDP: 4.0000 mg | ORAL_TABLET | Freq: Three times a day (TID) | ORAL | 0 refills | Status: DC | PRN

## 2022-11-16 MED ORDER — PREGABALIN 100 MG PO CAPS: 100.0000 mg | ORAL_CAPSULE | Freq: Three times a day (TID) | ORAL | 2 refills | Status: DC

## 2022-11-16 MED ORDER — ESCITALOPRAM OXALATE 20 MG PO TABS: 20.0000 mg | ORAL_TABLET | Freq: Every day | ORAL | 0 refills | Status: DC

## 2022-11-16 NOTE — ED Notes (Signed)
Pt has been a bit jittery, per pt it is related to restless leg syndrome and being anxious about being in ED.  Both IV have been pulled out.  PO K given and snack of crackers and ginger ale which pt has tolderated so far.

## 2022-11-16 NOTE — Discharge Instructions (Addendum)
I would double your potassium for the next week and get it rechecked.   Your urinary bladder looked abnormal on the ct scan, I have sent a culture on the urine so someone may call you with a prescription for antibiotics if it is infected. If not, you may consider following up with a urologist for further evaluation.   Your Afib has resolved back to sinus rhythm with the medications here, so you may want to let your cardiologist know this happened although it is likely related to your illness today.   Please return here for any new/worsening symptoms.

## 2022-11-17 ENCOUNTER — Ambulatory Visit (HOSPITAL_BASED_OUTPATIENT_CLINIC_OR_DEPARTMENT_OTHER): Payer: Medicare Other | Admitting: Physical Therapy

## 2022-11-19 ENCOUNTER — Telehealth: Payer: Self-pay

## 2022-11-19 NOTE — Telephone Encounter (Signed)
Approved 08/21/22-11/19/2023

## 2022-11-19 NOTE — Telephone Encounter (Signed)
PA for Pregabalin submitted. 

## 2022-11-20 ENCOUNTER — Ambulatory Visit (HOSPITAL_BASED_OUTPATIENT_CLINIC_OR_DEPARTMENT_OTHER): Payer: Medicare Other | Admitting: Physical Therapy

## 2022-11-20 ENCOUNTER — Other Ambulatory Visit: Payer: Self-pay | Admitting: Internal Medicine

## 2022-11-20 ENCOUNTER — Encounter: Payer: Self-pay | Admitting: Cardiovascular Disease

## 2022-11-20 DIAGNOSIS — R4189 Other symptoms and signs involving cognitive functions and awareness: Secondary | ICD-10-CM

## 2022-11-21 ENCOUNTER — Other Ambulatory Visit: Payer: Self-pay | Admitting: Internal Medicine

## 2022-11-21 DIAGNOSIS — E269 Hyperaldosteronism, unspecified: Secondary | ICD-10-CM

## 2022-11-21 DIAGNOSIS — E876 Hypokalemia: Secondary | ICD-10-CM

## 2022-11-22 ENCOUNTER — Other Ambulatory Visit: Payer: Self-pay | Admitting: Internal Medicine

## 2022-11-22 DIAGNOSIS — I480 Paroxysmal atrial fibrillation: Secondary | ICD-10-CM

## 2022-11-23 ENCOUNTER — Encounter: Payer: Self-pay | Admitting: Internal Medicine

## 2022-11-23 ENCOUNTER — Other Ambulatory Visit: Payer: Self-pay

## 2022-11-23 DIAGNOSIS — I480 Paroxysmal atrial fibrillation: Secondary | ICD-10-CM

## 2022-11-23 MED ORDER — APIXABAN 5 MG PO TABS: 5.0000 mg | ORAL_TABLET | Freq: Two times a day (BID) | ORAL | 5 refills | Status: DC

## 2022-11-23 NOTE — Telephone Encounter (Signed)
Prescription refill request for Eliquis received. Indication:afib Last office visit:10/24 Scr:0.74  11/24 Age: 72 Weight:129.8  kg  Prescription refilled

## 2022-11-24 ENCOUNTER — Ambulatory Visit (HOSPITAL_BASED_OUTPATIENT_CLINIC_OR_DEPARTMENT_OTHER): Payer: Medicare Other | Admitting: Physical Therapy

## 2022-11-24 NOTE — Telephone Encounter (Signed)
Spoke with patient, feeling much better since ED visit and hasn't had anymore signs/symptoms of AF. Patient instructed to follow up with [facility] if his needs change, going to follow up with his PCP as instructed at the ED. No needs at this time

## 2022-11-27 ENCOUNTER — Encounter (HOSPITAL_BASED_OUTPATIENT_CLINIC_OR_DEPARTMENT_OTHER): Payer: Medicare Other | Admitting: Physical Therapy

## 2022-11-30 ENCOUNTER — Ambulatory Visit (HOSPITAL_BASED_OUTPATIENT_CLINIC_OR_DEPARTMENT_OTHER): Payer: Medicare Other | Admitting: Physical Therapy

## 2022-12-02 ENCOUNTER — Encounter (HOSPITAL_BASED_OUTPATIENT_CLINIC_OR_DEPARTMENT_OTHER): Payer: Medicare Other | Admitting: Physical Therapy

## 2022-12-04 ENCOUNTER — Encounter: Payer: Self-pay | Admitting: Cardiovascular Disease

## 2022-12-07 ENCOUNTER — Telehealth: Payer: Self-pay

## 2022-12-07 MED ORDER — AMLODIPINE BESYLATE 5 MG PO TABS: 5.0000 mg | ORAL_TABLET | Freq: Every day | ORAL | 3 refills | Status: DC

## 2022-12-07 NOTE — Telephone Encounter (Signed)
Transition Care Management Follow-up Telephone Call Date of discharge and from where: Brandon [name] 11/11 How have you been since you were released from the hospital? Declined call after answering  Any questions or concerns?   Items Reviewed: Did the pt receive and understand the discharge instructions provided?  Medications obtained and verified?  Other?  Any new allergies since your discharge?  Dietary orders reviewed?  Do you have support at home?    Follow up appointments reviewed:  PCP Hospital f/u appt confirmed?  Scheduled to see  on  @ . Specialist Hospital f/u appt confirmed?  Scheduled to see  on  @ . Are transportation arrangements needed?  If their condition worsens, is the pt aware to call PCP or go to the Emergency Dept.?  Was the patient provided with contact information for the PCP's office or ED?  Was to pt encouraged to call back with questions or concerns?

## 2022-12-08 ENCOUNTER — Ambulatory Visit (HOSPITAL_BASED_OUTPATIENT_CLINIC_OR_DEPARTMENT_OTHER): Payer: Medicare Other | Admitting: Physical Therapy

## 2022-12-11 ENCOUNTER — Encounter (HOSPITAL_BASED_OUTPATIENT_CLINIC_OR_DEPARTMENT_OTHER): Payer: Medicare Other | Admitting: Physical Therapy

## 2022-12-15 ENCOUNTER — Ambulatory Visit (HOSPITAL_BASED_OUTPATIENT_CLINIC_OR_DEPARTMENT_OTHER): Payer: Medicare Other | Admitting: Physical Therapy

## 2022-12-15 ENCOUNTER — Encounter: Payer: Self-pay | Admitting: Physical Medicine & Rehabilitation

## 2022-12-15 ENCOUNTER — Encounter: Payer: Medicare Other | Attending: Physical Medicine & Rehabilitation | Admitting: Physical Medicine & Rehabilitation

## 2022-12-15 VITALS — BP 123/82 | HR 74 | Ht 76.0 in | Wt 282.0 lb

## 2022-12-15 DIAGNOSIS — M545 Low back pain, unspecified: Secondary | ICD-10-CM | POA: Insufficient documentation

## 2022-12-15 DIAGNOSIS — G8929 Other chronic pain: Secondary | ICD-10-CM | POA: Diagnosis not present

## 2022-12-15 DIAGNOSIS — M47816 Spondylosis without myelopathy or radiculopathy, lumbar region: Secondary | ICD-10-CM | POA: Insufficient documentation

## 2022-12-15 DIAGNOSIS — F32A Depression, unspecified: Secondary | ICD-10-CM | POA: Insufficient documentation

## 2022-12-15 NOTE — Progress Notes (Signed)
Subjective:    Patient ID: Brandon [name]., male    DOB: 12-07-1950, 72 y.o.   MRN: [id number]  HPI  HPI   Brandon [name]. is a 72 y.o. year old male  who  has a past medical history of Anxiety, Arthritis, Benign neoplasm of colon, Benign prostatic hyperplasia with urinary obstruction, Bilateral inguinal hernia (05/29/2011), Bilateral pulmonary embolism (HCC) (3/14), Breast lump (03/29/2019), Calculus of kidney (10/21/2016), Cataract, Chronic bilateral low back pain with sciatica (03/21/2012), Chronic pain, Finger mass, right (10/17/2020), History of alcohol abuse (03/07/2016), History of colon polyps (11/18/2015), History of kidney stones, History of pulmonary embolism (03/19/2012), Hyperlipemia, Hypertension, Insomnia, Insomnia due to medical condition (12/23/2016), Major depressive disorder, recurrent episode (HCC), Mitral regurgitation (04/24/2013), Morbid obesity (HCC) (03/07/2016), Nonrheumatic mitral valve insufficiency (04/25/2013), Obesity, OSA (obstructive sleep apnea), Osteoporosis, Persistent atrial fibrillation (HCC), Recurrent unilateral inguinal hernia (07/17/2013), Restless leg syndrome, S/P right total knee arthroplasty (05/24/2018), Sciatica, Spinal stenosis, lumbar region with neurogenic claudication, SVT (supraventricular tachycardia) (HCC) (03/07/2016), Thrombus of left atrial appendage (03/09/2013), Uncomplicated asthma (06/20/2008), and Ventral hernia, unspecified, without mention of obstruction or gangrene.   They are presenting to PM&R clinic as a new patient for pain management evaluation. They were referred by Dr. Yetta Barre for treatment of lumbar spinal stenosis pain.  Brandon [name] reports progressive worsening pain in his lower back that radiates to his outer and inner thighs.  He says this pain has been worsening gradually over many years.  Pain is worsened with standing up and walking.  Pain has a squeezing quality and he says the pain worsens with each step when he is walking.  Pain is improved  with bending forward or sitting down.  He reports having a history of scoliosis.  He was previously followed by [facility].  Patient was also seen for several years by Dr. Alvester Morin.  He had a trial of spinal cord stimulator but appears it was not beneficial enough to where he wanted to have it implanted.    Patient reports multiple spinal injections by Dr. Alvester Morin and he thinks that injections that he had reduced his pain from several weeks.  He is not sure which injections ESI versus facet joint ablation provided him with the best relief.   Chart review indicates he had radiofrequency ablation of left L3-4, L4-5 and L5-S1 facet joints by Dr. Alvester Morin after having a double diagnostic medial branch block with more than 50% relief.  It does not appear he had a lot of relief from the RFA.   More recently he was seen at [facility] neurosurgery.  Patient reports neurosurgery suggested he do injections again and he wanted to do them in [place] versus doing them at [facility] due to distance.  Chart review indicates neurosurgery feels that possibly upper lumbar facet joints could be contributing to his pain and they suggested upper lumbar MBB and possible RFA.   Patient reports having leg length discrepancy and has a lift in his left shoe.   Patient reports he tries to stay active and tries to exercise in a pool when he can.  He used to like to swim when he was younger.   He uses a cane for balance but does not want to use a walker.   Red flag symptoms: No red flags for back pain endorsed in Hx or ROS   Medications tried: Topical medications denies , tried CBD ointment in the past  Nsaids - limited by Afib  Tylenol  - Helps a little  Opiates  Hydrocodone and oxycodone- help a little, helps him.  He would like to use this only intermittently when his pain is very severe during acute exacerbations Gabapentin /- Takes TID 800mg   TCAs  -denies  SNRIs  -does not recall   Other treatments: PT- helps while he is  getting it but results havent lasted  TENs unit -denies benefit Injections -ESI, RFA, spinal cord stimulator trial in past by Dr. Alvester Morin  Surgery denies prior spine surgery   Goals for pain management: To be more active and have decreased pain     Pain is going down his left leg to his knee    Prior UDS results:  Labs (Brief)          Component Value Date/Time    LABOPIA NEGATIVE 03/17/2012 0133    COCAINSCRNUR NEGATIVE 03/17/2012 0133    LABBENZ POSITIVE (A) 03/17/2012 0133    AMPHETMU NEGATIVE 03/17/2012 0133      Interval history 11/12/2022 [name] is here for follow-up regarding his chronic low back pain.  He had bilateral T12, L1, L2 medial branch blocks completed on 10/27/2022 by Dr. Wynn Banker.  Patient reports he did not have significant benefit after his procedure.  Patient reports he tried to go to aquatic therapy, however patient had incident with staff and left without being seen.  Pain overall unchanged since last visit.  He has not tried Lyrica yet, has not picked up from the pharmacy.   Interval history 12/15/22 Patient is here for follow-up regarding his chronic back pain.  He continues to have significant back pain although he feels like it is improved since starting Lyrica.  Reports he is tolerating this medication well.  He continues to have pain during activities.  He is open to retrying aquatic therapy.  His activity continues to be limited, has a hard time walking into stores from the parking lot when he needs to go shopping or pick up something from the house.. Reports occasional dizziness when for standing up, this improves after a few steps.  Pain Inventory Average Pain 7 Pain Right Now 7 My pain is dull and stabbing  In the last 24 hours, has pain interfered with the following? General activity 8 Relation with others 8 Enjoyment of life 8 What TIME of day is your pain at its worst? morning , daytime, evening, and night Sleep (in general) Fair  Pain is  worse with: walking and standing Pain improves with: rest and heat/ice Relief from Meds: 5  Family History  Problem Relation Age of Onset   Cancer Father        bone   Heart failure Mother    Hypertension Mother    Asthma Mother    Cancer Paternal Grandmother        ovarian   CVA Other        Fam Hx of multiple myeloma   Diabetes Other        Fam Hx of DM   Social History   Socioeconomic History   Marital status: Married    Spouse name: Not on file   Number of children: 2   Years of education: college   Highest education level: Bachelor's degree (e.g., BA, AB, BS)  Occupational History   Not on file  Tobacco Use   Smoking status: Former    Current packs/day: 0.00    Average packs/day: 0.5 packs/day for 5.0 years (2.5 ttl pk-yrs)    Types: Cigarettes    Start date: 70  Quit date: 45    Years since quitting: 48.9   Smokeless tobacco: Never   Tobacco comments:    Former smoker 03/27/22  Vaping Use   Vaping status: Never Used  Substance and Sexual Activity   Alcohol use: No    Comment: Former EtOH abuse, stopped 06/2016   Drug use: No    Comment: negative hx for IV drug abuse   Sexual activity: Not on file  Other Topics Concern   Not on file  Social History Narrative   Lives with wife in [place]   Retired Investment banker, operational   Wife lives in town (still married but lives separate)   Has biological daughter in [place] (Santel Dipalma)   Social Determinants of Health   Financial Resource Strain: Low Risk  (06/09/2022)   Overall Financial Resource Strain (CARDIA)    Difficulty of Paying Living Expenses: Not hard at all  Food Insecurity: No Food Insecurity (06/09/2022)   Hunger Vital Sign    Worried About Running Out of Food in the Last Year: Never true    Ran Out of Food in the Last Year: Never true  Transportation Needs: No Transportation Needs (06/09/2022)   PRAPARE - Administrator, Civil Service (Medical): No    Lack of Transportation  (Non-Medical): No  Physical Activity: Unknown (06/09/2022)   Exercise Vital Sign    Days of Exercise per Week: 0 days    Minutes of Exercise per Session: Not on file  Recent Concern: Physical Activity - Inactive (06/09/2022)   Exercise Vital Sign    Days of Exercise per Week: 0 days    Minutes of Exercise per Session: 0 min  Stress: Stress Concern Present (06/09/2022)   [employer] of Occupational Health - Occupational Stress Questionnaire    Feeling of Stress : Rather much  Social Connections: Socially Isolated (06/09/2022)   Social Connection and Isolation Panel [NHANES]    Frequency of Communication with Friends and Family: Once a week    Frequency of Social Gatherings with Friends and Family: Never    Attends Religious Services: Never    Database administrator or Organizations: No    Attends Engineer, structural: Not on file    Marital Status: Separated   Past Surgical History:  Procedure Laterality Date   ATRIAL FIBRILLATION ABLATION N/A 02/27/2022   Procedure: ATRIAL FIBRILLATION ABLATION;  Surgeon: Nelly Laurence, Roberts Gaudy, MD;  [facility];  Service: Cardiovascular;  Laterality: N/A;   CARDIOVERSION N/A 03/21/2012   Procedure: CARDIOVERSION;  Surgeon: Ricki Rodriguez, MD;  [facility]: [facility] ENDOSCOPY;  Service: Cardiovascular;  Laterality: N/A;   CARDIOVERSION N/A 04/24/2013   Procedure: CARDIOVERSION;  Surgeon: Lars Masson, MD;  Location: [facility] ENDOSCOPY;  Service: Cardiovascular;  Laterality: N/A;   CARDIOVERSION N/A 06/27/2015   Procedure: CARDIOVERSION;  Surgeon: Laurey Morale, MD;  Location: [facility] ENDOSCOPY;  Service: Cardiovascular;  Laterality: N/A;   CARDIOVERSION N/A 11/11/2021   Procedure: CARDIOVERSION;  Surgeon: Lewayne Bunting, MD;  Location: [facility] ENDOSCOPY;  Service: Cardiovascular;  Laterality: N/A;   CARDIOVERSION N/A 05/11/2022   Procedure: CARDIOVERSION;  Surgeon: Christell Constant, MD;  [facility];  Service: Cardiovascular;   Laterality: N/A;   CARDIOVERSION N/A 07/13/2022   Procedure: CARDIOVERSION;  Surgeon: Thurmon Fair, MD;  [facility];  Service: Cardiovascular;  Laterality: N/A;   CATARACT EXTRACTION     COLONOSCOPY W/ POLYPECTOMY     ELECTROPHYSIOLOGIC  STUDY N/A 07/30/2015   Procedure: Atrial Fibrillation Ablation;  Surgeon: Hillis Range, MD;  Location: [facility] INVASIVE CV LAB;  Service: Cardiovascular;  Laterality: N/A;   EXTRACORPOREAL SHOCK WAVE LITHOTRIPSY Right 10/29/2016   Procedure: RIGHT EXTRACORPOREAL SHOCK WAVE LITHOTRIPSY (ESWL);  Surgeon: Sebastian Ache, MD;  Location: [facility] ORS;  Service: Urology;  Laterality: Right;   EYE SURGERY Right    cataract   FEMUR FRACTURE SURGERY     HERNIA REPAIR  07/06/2011   INGUINAL HERNIA REPAIR Right 07/28/2013   Procedure: RIGHT INGUINAL HERNIA REPAIR;  Surgeon: Wilmon Arms. Corliss Skains, MD;  Location: [facility];  Service: General;  Laterality: Right;   INGUINAL HERNIA REPAIR Right 09/22/2016   Procedure: LAPAROSCOPIC RIGHT INGUINAL HERNIA;  Surgeon: Sheliah Hatch De Blanch, MD;  Location: [facility] OR[facility];  Service: General;  Laterality: Right;  With MESH   INSERTION OF MESH Right 07/28/2013   Procedure: INSERTION OF MESH;  Surgeon: Wilmon Arms. Corliss Skains, MD;  Location: [facility] OR;  Service: General;  Laterality: Right;   KNEE ARTHROSCOPY     left   REPLACEMENT TOTAL KNEE Left    ROTATOR CUFF REPAIR     right   ROTATOR CUFF REPAIR Left 03/08/2014   DR SUPPLE   SHOULDER ARTHROSCOPY WITH ROTATOR CUFF REPAIR AND SUBACROMIAL DECOMPRESSION Left 03/08/2014   Procedure: LEFT SHOULDER ARTHROSCOPY WITH ROTATOR CUFF REPAIR/SUBACROMIAL DECOMPRESSION/DISTAL CLAVICLE RESECTION;  Surgeon: Senaida Lange, MD;  [facility];  Service: Orthopedics;  Laterality: Left;   SVT ABLATION N/A 01/23/2019   Procedure: SVT ABLATION;  Surgeon: Marinus Maw, MD;  Location: [facility] INVASIVE CV LAB;  Service: Cardiovascular;  Laterality: N/A;   TEE WITHOUT CARDIOVERSION N/A 03/21/2012   Procedure: TRANSESOPHAGEAL  ECHOCARDIOGRAM (TEE);  Surgeon: Ricki Rodriguez, MD;  Location: [facility] ENDOSCOPY;  Service: Cardiovascular;  Laterality: N/A;   TEE WITHOUT CARDIOVERSION N/A 04/24/2013   Procedure: TRANSESOPHAGEAL ECHOCARDIOGRAM (TEE);  Surgeon: Lars Masson, MD;  Location: [facility] ENDOSCOPY;  Service: Cardiovascular;  Laterality: N/A;   TEE WITHOUT CARDIOVERSION N/A 07/29/2015   Procedure: TRANSESOPHAGEAL ECHOCARDIOGRAM (TEE);  Surgeon: Pricilla Riffle, MD;  Location: [facility] ENDOSCOPY;  Service: Cardiovascular;  Laterality: N/A;   TEE WITHOUT CARDIOVERSION N/A 05/11/2022   Procedure: TRANSESOPHAGEAL ECHOCARDIOGRAM;  Surgeon: Christell Constant, MD;  [facility];  Service: Cardiovascular;  Laterality: N/A;   TONSILLECTOMY     TOTAL KNEE ARTHROPLASTY Right 05/24/2018   Procedure: RIGHT TOTAL KNEE ARTHROPLASTY;  Surgeon: Durene Romans, MD;  [facility];  Service: Orthopedics;  Laterality: Right;  70 mins   Past Surgical History:  Procedure Laterality Date   ATRIAL FIBRILLATION ABLATION N/A 02/27/2022   Procedure: ATRIAL FIBRILLATION ABLATION;  Surgeon: Maurice Small, MD;  [facility];  Service: Cardiovascular;  Laterality: N/A;   CARDIOVERSION N/A 03/21/2012   Procedure: CARDIOVERSION;  Surgeon: Ricki Rodriguez, MD;  [facility];  Service: Cardiovascular;  Laterality: N/A;   CARDIOVERSION N/A 04/24/2013   Procedure: CARDIOVERSION;  Surgeon: Lars Masson, MD;  Location: [facility] ENDOSCOPY;  Service: Cardiovascular;  Laterality: N/A;   CARDIOVERSION N/A 06/27/2015   Procedure: CARDIOVERSION;  Surgeon: Laurey Morale, MD;  Location: [facility] ENDOSCOPY;  Service: Cardiovascular;  Laterality: N/A;   CARDIOVERSION N/A 11/11/2021   Procedure: CARDIOVERSION;  Surgeon: Lewayne Bunting, MD;  Location: [facility] ENDOSCOPY;  Service: Cardiovascular;  Laterality: N/A;   CARDIOVERSION N/A 05/11/2022   Procedure: CARDIOVERSION;  Surgeon: Christell Constant, MD;  [facility];   Service: Cardiovascular;  Laterality: N/A;   CARDIOVERSION N/A 07/13/2022   Procedure: CARDIOVERSION;  Surgeon: Thurmon Fair, MD;  [facility];  Service: Cardiovascular;  Laterality: N/A;   CATARACT EXTRACTION     COLONOSCOPY W/ POLYPECTOMY     ELECTROPHYSIOLOGIC STUDY N/A 07/30/2015   Procedure: Atrial Fibrillation Ablation;  Surgeon: Hillis Range, MD;  Location: [facility] INVASIVE CV LAB;  Service: Cardiovascular;  Laterality: N/A;   EXTRACORPOREAL SHOCK WAVE LITHOTRIPSY Right 10/29/2016   Procedure: RIGHT EXTRACORPOREAL SHOCK WAVE LITHOTRIPSY (ESWL);  Surgeon: Sebastian Ache, MD;  [facility];  Service: Urology;  Laterality: Right;   EYE SURGERY Right    cataract   FEMUR FRACTURE SURGERY     HERNIA REPAIR  07/06/2011   INGUINAL HERNIA REPAIR Right 07/28/2013   Procedure: RIGHT INGUINAL HERNIA REPAIR;  Surgeon: Wilmon Arms. Corliss Skains, MD;  [facility];  Service: General;  Laterality: Right;   INGUINAL HERNIA REPAIR Right 09/22/2016   Procedure: LAPAROSCOPIC RIGHT INGUINAL HERNIA;  Surgeon: Sheliah Hatch De Blanch, MD;  Location[facility];  Service: General;  Laterality: Right;  With MESH   INSERTION OF MESH Right 07/28/2013   Procedure: INSERTION OF MESH;  Surgeon: Wilmon Arms. Corliss Skains, MD;  [facility];  Service: General;  Laterality: Right;   KNEE ARTHROSCOPY     left   REPLACEMENT TOTAL KNEE Left    ROTATOR CUFF REPAIR     right   ROTATOR CUFF REPAIR Left 03/08/2014   DR SUPPLE   SHOULDER ARTHROSCOPY WITH ROTATOR CUFF REPAIR AND SUBACROMIAL DECOMPRESSION Left 03/08/2014   Procedure: LEFT SHOULDER ARTHROSCOPY WITH ROTATOR CUFF REPAIR/SUBACROMIAL DECOMPRESSION/DISTAL CLAVICLE RESECTION;  Surgeon: Senaida Lange, MD;  [facility];  Service: Orthopedics;  Laterality: Left;   SVT ABLATION N/A 01/23/2019   Procedure: SVT ABLATION;  Surgeon: Marinus Maw, MD;  Location: [facility] INVASIVE CV LAB;  Service: Cardiovascular;  Laterality: N/A;   TEE WITHOUT CARDIOVERSION N/A 03/21/2012    Procedure: TRANSESOPHAGEAL ECHOCARDIOGRAM (TEE);  Surgeon: Ricki Rodriguez, MD;  Location: [facility] ENDOSCOPY;  Service: Cardiovascular;  Laterality: N/A;   TEE WITHOUT CARDIOVERSION N/A 04/24/2013   Procedure: TRANSESOPHAGEAL ECHOCARDIOGRAM (TEE);  Surgeon: Lars Masson, MD;  Location: [facility] ENDOSCOPY;  Service: Cardiovascular;  Laterality: N/A;   TEE WITHOUT CARDIOVERSION N/A 07/29/2015   Procedure: TRANSESOPHAGEAL ECHOCARDIOGRAM (TEE);  Surgeon: Pricilla Riffle, MD;  Location: [facility] ENDOSCOPY;  Service: Cardiovascular;  Laterality: N/A;   TEE WITHOUT CARDIOVERSION N/A 05/11/2022   Procedure: TRANSESOPHAGEAL ECHOCARDIOGRAM;  Surgeon: Christell Constant, MD;  [facility];  Service: Cardiovascular;  Laterality: N/A;   TONSILLECTOMY     TOTAL KNEE ARTHROPLASTY Right 05/24/2018   Procedure: RIGHT TOTAL KNEE ARTHROPLASTY;  Surgeon: Durene Romans, MD;  [facility];  Service: Orthopedics;  Laterality: Right;  70 mins   Past Medical History:  Diagnosis Date   Anxiety    Arthritis    Benign neoplasm of colon    Benign prostatic hyperplasia with urinary obstruction    Bilateral inguinal hernia 05/29/2011   Bilateral pulmonary embolism (HCC) 3/14   admitted to [facility],  treated with Xarelto   Breast lump 03/29/2019   Calculus of kidney 10/21/2016   Cataract    Chronic bilateral low back pain with sciatica 03/21/2012   Chronic pain    Finger mass, right 10/17/2020   History of alcohol abuse 03/07/2016   History of colon polyps 11/18/2015   History of kidney stones    History of pulmonary embolism 03/19/2012  Hyperlipemia    Hypertension    Insomnia    Insomnia due to medical condition 12/23/2016   Polyuria secondary to BPH   Major depressive disorder, recurrent episode (HCC)    Mitral regurgitation 04/24/2013   Mild by TEE   Morbid obesity (HCC) 03/07/2016   Nonrheumatic mitral valve insufficiency 04/25/2013   Mild by TEE  Overview:  Overview:  Mild by TEE Overview:  Overview:   Overview:  Mild by TEE Overview:  Overview:  Mild by TEE   Obesity    OSA (obstructive sleep apnea)    noncompliant with CPAP.  07/27/13- awaiting a CPAP- unable to tolerate mask   Osteoporosis    Persistent atrial fibrillation (HCC)    Recurrent unilateral inguinal hernia 07/17/2013   Restless leg syndrome    takes gabapentin   S/P right total knee arthroplasty 05/24/2018   Sciatica    Spinal stenosis, lumbar region with neurogenic claudication    SVT (supraventricular tachycardia) (HCC) 03/07/2016   Thrombus of left atrial appendage 03/09/2013   Uncomplicated asthma 06/20/2008   Overview:  Overview:  Overview:  Overview:  Qualifier: Diagnosis of  By: Tawanna Cooler MD, Eugenio Hoes Overview:  Overview:  Qualifier: Diagnosis of  By: Tawanna Cooler MD, Eugenio Hoes   Ventral hernia, unspecified, without mention of obstruction or gangrene    right abdominal wall   Pulse 74   Ht 6\' 4"  (1.93 m)   Wt 282 lb (127.9 kg)   SpO2 92%   BMI 34.33 kg/m   Opioid Risk Score:   Fall Risk Score:  `1  Depression screen [facility] 2/9     12/15/2022    9:48 AM 11/12/2022    9:47 AM 10/29/2022   10:21 AM 09/22/2022   10:43 AM 06/09/2022   11:33 AM 02/12/2022    2:21 PM 12/10/2021   10:55 AM  Depression screen PHQ 2/9  Decreased Interest 0 1  1 0 1 0  Down, Depressed, Hopeless 0 1 1 1  0 1 0  PHQ - 2 Score 0 2 1 2  0 2 0  Altered sleeping   1 0 0 3   Tired, decreased energy    2 0 3   Change in appetite    2 0 2   Feeling bad or failure about yourself     3 0 0   Trouble concentrating    2 0 1   Moving slowly or fidgety/restless    2 0 0   Suicidal thoughts    1 0 0   PHQ-9 Score   2 14 0 11   Difficult doing work/chores    Very difficult  Somewhat difficult       Review of Systems  Musculoskeletal:  Positive for back pain.  All other systems reviewed and are negative.     Objective:   Physical Exam     12/15/2022    9:45 AM 11/16/2022    1:56 AM 11/16/2022    1:00 AM  Vitals with BMI  Height 6\' 4"     Weight 282  lbs    BMI 34.34    Systolic 123 159 409  Diastolic 82 128 94  Pulse 74 98 118    Gen: no distress, normal appearing HEENT: oral mucosa pink and moist Chest: normal effort, normal rate of breathing Abd: soft, non-distended Ext: no edema Psych: Appropriate, pleasant Skin: intact Neuro: Alert and oriented, follows commands, cranial nerves II through XII grossly intact, normal speech and language Moving all 4 extremities  to gravity and resistance Sensory exam normal for light touch and pain in all 4 limbs.  No ankle clonus DTR absent bilateral patella and right ankle, DTR 1+ left ankle- not checked today   Musculoskeletal:  Tenderness to palpation about bilateral paraspinal muscles in the lumbar spine-unchanged No significant cervical paraspinal tenderness Kyphotic posture Slump test negative bilaterally Fairly good spinal full forward flexion.   Facet loading positive and pain with spinal extension   MRI from [facility] note 05/14/22 Lumbar MRI 05/13/22: Anatomical variants: none Alignment: Degenerative levoscoliosis. Conus medullaris: terminates at approximately L1. Spinal Cord and Cauda Equina: Visualized portions of the spinal cord is normal in morphology and signal. Normal appearance of the cauda equina.  Bone marrow signal: No suspicious lesions.  T12-L1: Mild disc and facet degenerative changes resulting in mild spinal canal stenosis.  L1-L2: Unremarkable.  L2-L3: Mild disc and facet degenerative changes without significant spinal canal or neural foraminal stenosis.  L3-L4: Disc bulge and facet arthropathy resulting in mild canal stenosis.  L4-L5: Mild disc and facet degenerative changes resulting in mild canal stenosis.  L5-S1: Advanced bilateral facet arthropathy and mild disc degenerative changes resulting in mild left neural foraminal stenosis.  IMPRESSION: Multilevel disc and facet degenerative changes above without high-grade stenosis.    L spine Xray from [facility]  note  Spine x-rays 04/01/22: IMPRESSION: 1. S-shaped scoliosis of the thoracolumbar spine with positive sagittal and coronal imbalance. 2. Mild stepwise retrolisthesis from L2 to L5 without evidence of dynamic instability. 3. 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       Assessment & Plan:   1) Chronic low back pain with degenerative spinal canal stenosis and multilevel lumbar facet joint degeneration   2) Depression, denies SI or HI. On SSRI  3) Dizziness when getting up intermittently.  Suspect orthostatic hypotension     -Discussed foods that are helpful for pain -DC gabapentin and start Lyrica 100 mg 3 times daily.  This was done last visit.  Patient reports he has not tried this yet.  He is agreeable to try this now. -Initially plan to refer to Dr. Wynn Banker for [facility] as patient indicated this was helpful in the past.  On chart review it appears there is some degeneration in upper facet joints of the lumbar spine, will discuss possibility of medial branch block/RFA to upper lumbar spine.  Lower lumbar spine RFA was completed previously by Dr. Alvester Morin.  Patient was referred for medial branch block of upper lumbar spine.  Medial branch block bilateral T12, L1-L2 on 10/24 without significant benefit reported. -Prior visit I placed referral to psychiatry for medication management due to continued depression -Consult placed last visit for aquatic therapy-MediQ placed -Suggested trying oral compression stockings to prevent potential orthostatic hypotension.  He reports he has these at home but has not used them in a while and he will try this. -Document for parking placard handicap given

## 2022-12-18 ENCOUNTER — Encounter (HOSPITAL_BASED_OUTPATIENT_CLINIC_OR_DEPARTMENT_OTHER): Payer: Medicare Other | Admitting: Physical Therapy

## 2022-12-20 ENCOUNTER — Encounter: Payer: Self-pay | Admitting: Cardiovascular Disease

## 2022-12-22 DIAGNOSIS — R260 Ataxic gait: Secondary | ICD-10-CM | POA: Diagnosis not present

## 2022-12-22 DIAGNOSIS — M47816 Spondylosis without myelopathy or radiculopathy, lumbar region: Secondary | ICD-10-CM | POA: Diagnosis not present

## 2022-12-24 DIAGNOSIS — M47816 Spondylosis without myelopathy or radiculopathy, lumbar region: Secondary | ICD-10-CM | POA: Diagnosis not present

## 2022-12-24 DIAGNOSIS — R260 Ataxic gait: Secondary | ICD-10-CM | POA: Diagnosis not present

## 2022-12-25 ENCOUNTER — Other Ambulatory Visit: Payer: Self-pay | Admitting: Internal Medicine

## 2023-01-01 DIAGNOSIS — R260 Ataxic gait: Secondary | ICD-10-CM | POA: Diagnosis not present

## 2023-01-01 DIAGNOSIS — M47816 Spondylosis without myelopathy or radiculopathy, lumbar region: Secondary | ICD-10-CM | POA: Diagnosis not present

## 2023-01-04 ENCOUNTER — Encounter: Payer: Self-pay | Admitting: Internal Medicine

## 2023-01-04 ENCOUNTER — Other Ambulatory Visit: Payer: Self-pay | Admitting: Internal Medicine

## 2023-01-04 DIAGNOSIS — R260 Ataxic gait: Secondary | ICD-10-CM | POA: Diagnosis not present

## 2023-01-04 DIAGNOSIS — M47816 Spondylosis without myelopathy or radiculopathy, lumbar region: Secondary | ICD-10-CM | POA: Diagnosis not present

## 2023-01-04 DIAGNOSIS — N401 Enlarged prostate with lower urinary tract symptoms: Secondary | ICD-10-CM

## 2023-01-07 ENCOUNTER — Other Ambulatory Visit: Payer: Self-pay | Admitting: Internal Medicine

## 2023-01-07 DIAGNOSIS — M47816 Spondylosis without myelopathy or radiculopathy, lumbar region: Secondary | ICD-10-CM | POA: Diagnosis not present

## 2023-01-07 DIAGNOSIS — R260 Ataxic gait: Secondary | ICD-10-CM | POA: Diagnosis not present

## 2023-01-07 DIAGNOSIS — F331 Major depressive disorder, recurrent, moderate: Secondary | ICD-10-CM

## 2023-01-08 ENCOUNTER — Other Ambulatory Visit (HOSPITAL_COMMUNITY)
Admission: RE | Admit: 2023-01-08 | Discharge: 2023-01-08 | Disposition: A | Discharge status: Home / Self Care | Payer: Self-pay | Source: Ambulatory Visit | Attending: Oncology | Admitting: Oncology

## 2023-01-08 DIAGNOSIS — Z006 Encounter for examination for normal comparison and control in clinical research program: Secondary | ICD-10-CM | POA: Insufficient documentation

## 2023-01-12 DIAGNOSIS — R260 Ataxic gait: Secondary | ICD-10-CM | POA: Diagnosis not present

## 2023-01-12 DIAGNOSIS — M47816 Spondylosis without myelopathy or radiculopathy, lumbar region: Secondary | ICD-10-CM | POA: Diagnosis not present

## 2023-01-14 DIAGNOSIS — R260 Ataxic gait: Secondary | ICD-10-CM | POA: Diagnosis not present

## 2023-01-14 DIAGNOSIS — M47816 Spondylosis without myelopathy or radiculopathy, lumbar region: Secondary | ICD-10-CM | POA: Diagnosis not present

## 2023-01-15 ENCOUNTER — Other Ambulatory Visit: Payer: Self-pay | Admitting: Internal Medicine

## 2023-01-15 DIAGNOSIS — J301 Allergic rhinitis due to pollen: Secondary | ICD-10-CM

## 2023-01-18 ENCOUNTER — Encounter: Payer: Self-pay | Admitting: Internal Medicine

## 2023-01-18 ENCOUNTER — Ambulatory Visit (INDEPENDENT_AMBULATORY_CARE_PROVIDER_SITE_OTHER): Payer: Medicare Other | Admitting: Internal Medicine

## 2023-01-18 VITALS — BP 122/76 | HR 65 | Temp 98.1°F | Resp 16 | Ht 76.0 in | Wt 287.0 lb

## 2023-01-18 DIAGNOSIS — E269 Hyperaldosteronism, unspecified: Secondary | ICD-10-CM | POA: Diagnosis not present

## 2023-01-18 DIAGNOSIS — F331 Major depressive disorder, recurrent, moderate: Secondary | ICD-10-CM | POA: Insufficient documentation

## 2023-01-18 DIAGNOSIS — R3916 Straining to void: Secondary | ICD-10-CM | POA: Diagnosis not present

## 2023-01-18 DIAGNOSIS — N401 Enlarged prostate with lower urinary tract symptoms: Secondary | ICD-10-CM | POA: Diagnosis not present

## 2023-01-18 DIAGNOSIS — R739 Hyperglycemia, unspecified: Secondary | ICD-10-CM | POA: Diagnosis not present

## 2023-01-18 DIAGNOSIS — N410 Acute prostatitis: Secondary | ICD-10-CM | POA: Diagnosis not present

## 2023-01-18 DIAGNOSIS — I10 Essential (primary) hypertension: Secondary | ICD-10-CM | POA: Diagnosis not present

## 2023-01-18 LAB — BASIC METABOLIC PANEL
BUN: 19 mg/dL (ref 6–23)
CO2: 30 meq/L (ref 19–32)
Calcium: 9.3 mg/dL (ref 8.4–10.5)
Chloride: 104 meq/L (ref 96–112)
Creatinine, Ser: 1.09 mg/dL (ref 0.40–1.50)
GFR: 66.82 mL/min (ref >60.00)
Glucose, Bld: 87 mg/dL (ref 70–99)
Potassium: 4.6 meq/L (ref 3.5–5.1)
Sodium: 141 meq/L (ref 135–145)

## 2023-01-18 LAB — URINALYSIS, ROUTINE W REFLEX MICROSCOPIC
Nitrite: NEGATIVE
Specific Gravity, Urine: 1.02 (ref 1.000–1.030)
Urine Glucose: NEGATIVE
Urobilinogen, UA: 1 (ref 0.0–1.0)
pH: 6 (ref 5.0–8.0)

## 2023-01-18 LAB — HEMOGLOBIN A1C: Hgb A1c MFr Bld: 5.8 % (ref 4.6–6.5)

## 2023-01-18 LAB — PSA: PSA: 0.24 ng/mL (ref 0.10–4.00)

## 2023-01-18 MED ORDER — ESCITALOPRAM OXALATE 20 MG PO TABS: 20.0000 mg | ORAL_TABLET | Freq: Every day | ORAL | 1 refills | Status: DC

## 2023-01-18 MED ORDER — TAMSULOSIN HCL 0.4 MG PO CAPS: 0.4000 mg | ORAL_CAPSULE | Freq: Every day | ORAL | 1 refills | Status: DC

## 2023-01-18 MED ORDER — CIPROFLOXACIN HCL 500 MG PO TABS: 500.0000 mg | ORAL_TABLET | Freq: Two times a day (BID) | ORAL | 0 refills | Status: AC

## 2023-01-18 NOTE — Progress Notes (Signed)
 Please let him know that is looks like he has a prostate gland infection I have prescribed cipro

## 2023-01-18 NOTE — Patient Instructions (Signed)
 Hypokalemia Hypokalemia means that the amount of potassium in the blood is lower than normal. Potassium is a mineral (electrolyte) that helps regulate the amount of fluid in the body. It also stimulates muscle tightening (contraction) and helps nerves work properly. Normally, most of the body's potassium is inside cells, and only a very small amount is in the blood. Because the amount in the blood is so small, minor changes to potassium levels in the blood can be life-threatening. What are the causes? This condition may be caused by: Antibiotic medicine. Diarrhea or vomiting. Taking too much of a medicine that helps you have a bowel movement (laxative) can cause diarrhea and lead to hypokalemia. Chronic kidney disease (CKD). Medicines that help the body get rid of excess fluid (diuretics). Eating disorders, such as anorexia or bulimia. Low magnesium levels in the body. Sweating a lot. What are the signs or symptoms? Symptoms of this condition include: Weakness. Constipation. Fatigue. Muscle cramps. Mental confusion. Skipped heartbeats or irregular heartbeat (palpitations). Tingling or numbness. How is this diagnosed? This condition is diagnosed with a blood test. How is this treated? This condition may be treated by: Taking potassium supplements. Adjusting the medicines that you take. Eating more foods that contain a lot of potassium. If your potassium level is very low, you may need to get potassium through an IV and be monitored in the hospital. Follow these instructions at home: Eating and drinking  Eat a healthy diet. A healthy diet includes fresh fruits and vegetables, whole grains, healthy fats, and lean proteins. If told, eat more foods that contain a lot of potassium. These include: Nuts, such as peanuts and pistachios. Seeds, such as sunflower seeds and pumpkin seeds. Peas, lentils, and lima beans. Whole grain and bran cereals and breads. Fresh fruits and vegetables,  such as apricots, avocado, bananas, cantaloupe, kiwi, oranges, tomatoes, asparagus, and potatoes. Juices, such as orange, tomato, and prune. Lean meats, including fish. Milk and milk products, such as yogurt. General instructions Take over-the-counter and prescription medicines only as told by your health care provider. This includes vitamins, natural food products, and supplements. Keep all follow-up visits. This is important. Contact a health care provider if: You have weakness that gets worse. You feel your heart pounding or racing. You vomit. You have diarrhea. You have diabetes and you have trouble keeping your blood sugar in your target range. Get help right away if: You have chest pain. You have shortness of breath. You have vomiting or diarrhea that lasts for more than 2 days. You faint. These symptoms may be an emergency. Get help right away. Call 911. Do not wait to see if the symptoms will go away. Do not drive yourself to the hospital. Summary Hypokalemia means that the amount of potassium in the blood is lower than normal. This condition is diagnosed with a blood test. Hypokalemia may be treated by taking potassium supplements, adjusting the medicines that you take, or eating more foods that are high in potassium. If your potassium level is very low, you may need to get potassium through an IV and be monitored in the hospital. This information is not intended to replace advice given to you by your health care provider. Make sure you discuss any questions you have with your health care provider. Document Revised: 09/05/2020 Document Reviewed: 09/05/2020 Elsevier Patient Education  2024 [employer].

## 2023-01-18 NOTE — Progress Notes (Signed)
 Subjective:  Patient ID: Brandon [name]., male    DOB: 02-28-1950  Age: 73 y.o. MRN: [id number]  CC: Hypertension and Atrial Fibrillation   HPI Brandon B Zuk Jr. presents for f/up--  Discussed the use of AI scribe software for clinical note transcription with the patient, who gave verbal consent to proceed.  History of Present Illness   The patient, with a history of cardiovascular disease, reports occasional episodes of dizziness and palpitations. These symptoms are transient and do not appear to cause significant distress. The patient is unable to recall the date of his last EKG. He denies experiencing chest pain or shortness of breath.  The patient is currently participating in an aquatic physical therapy program twice a week, which he reports as beneficial and enjoyable. During these sessions, he does not experience any adverse symptoms and overall, feels excellent.  However, the patient is experiencing severe back pain, which he attributes to carrying excess weight around his waist. Despite attempts to regulate eating habits and increase physical activity, he has been unsuccessful in losing weight. He expresses interest in trying a weight loss medication to alleviate the load on his back.  The patient also reports nocturia and a weak urinary stream, particularly noticeable after running out of his tamsulosin  medication. Despite these symptoms, he confirms that he is still able to pass urine. He denies experiencing any respiratory difficulties and believes he received a flu shot in September.  The patient's blood pressure was noted to be low, and he was advised to reduce his dose of Flomax .       Outpatient Medications Prior to Visit  Medication Sig Dispense Refill   amLODipine  (NORVASC ) 5 MG tablet Take 1 tablet (5 mg total) by mouth daily. 90 tablet 3   apixaban  (ELIQUIS ) 5 MG TABS tablet Take 1 tablet (5 mg total) by mouth 2 (two) times daily. 60 tablet 5   atorvastatin  (LIPITOR) 40  MG tablet TAKE 1 TABLET BY MOUTH EVERY DAY 90 tablet 1   carvedilol  (COREG ) 25 MG tablet Take 1 tablet (25 mg total) by mouth 2 (two) times daily with a meal. 180 tablet 3   dutasteride (AVODART) 0.5 MG capsule Take 0.5 mg by mouth daily.     flecainide  (TAMBOCOR ) 50 MG tablet Take 1 tablet (50 mg total) by mouth 2 (two) times daily. 180 tablet 3   fluticasone  (FLONASE ) 50 MCG/ACT nasal spray SPRAY 2 SPRAYS INTO EACH NOSTRIL EVERY DAY 48 mL 1   KLOR-CON  M20 20 MEQ tablet TAKE 1 TABLET BY MOUTH EVERY DAY 90 tablet 0   memantine  (NAMENDA ) 10 MG tablet TAKE ONE TABLET BY MOUTH TWICE A DAY 180 tablet 0   pregabalin  (LYRICA ) 100 MG capsule Take 1 capsule (100 mg total) by mouth 3 (three) times daily. 90 capsule 2   spironolactone  (ALDACTONE ) 25 MG tablet TAKE ONE TABLET BY MOUTH DAILY 90 tablet 0   triamcinolone  cream (KENALOG ) 0.1 % Apply 1 Application topically 2 (two) times daily as needed (rash).     escitalopram  (LEXAPRO ) 20 MG tablet TAKE 1 TABLET BY MOUTH EVERY DAY 90 tablet 0   ondansetron  (ZOFRAN -ODT) 4 MG disintegrating tablet Take 1 tablet (4 mg total) by mouth every 8 (eight) hours as needed. 4mg  ODT q4 hours prn nausea/vomit 4 tablet 0   tamsulosin  (FLOMAX ) 0.4 MG CAPS capsule TAKE 1 CAPSULE BY MOUTH TWICE A DAY 180 capsule 1   No facility-administered medications prior to visit.    ROS Review of  Systems  Constitutional: Negative.  Negative for appetite change, diaphoresis, fatigue and unexpected weight change.  HENT: Negative.    Eyes: Negative.   Respiratory: Negative.  Negative for cough, chest tightness, shortness of breath and wheezing.   Cardiovascular:  Negative for chest pain, palpitations and leg swelling.  Gastrointestinal:  Negative for abdominal pain, constipation, diarrhea, nausea and vomiting.  Genitourinary:  Positive for difficulty urinating (straining) and dysuria. Negative for flank pain, hematuria, scrotal swelling and testicular pain.  Musculoskeletal: Negative.   Negative for arthralgias and myalgias.  Skin: Negative.   Neurological:  Negative for dizziness, weakness and headaches.  Hematological:  Negative for adenopathy. Does not bruise/bleed easily.  Psychiatric/Behavioral:  Positive for confusion and decreased concentration. Negative for self-injury. The patient is not nervous/anxious.     Objective:  BP 122/76 (BP Location: Left Arm, Patient Position: Sitting, Cuff Size: Normal)   Pulse 65   Temp 98.1 F (36.7 C) (Oral)   Resp 16   Ht 6' 4 (1.93 m)   Wt 287 lb (130.2 kg)   SpO2 91%   BMI 34.93 kg/m   BP Readings from Last 3 Encounters:  01/18/23 122/76  12/15/22 123/82  11/16/22 (!) 159/128    Wt Readings from Last 3 Encounters:  01/18/23 287 lb (130.2 kg)  12/15/22 282 lb (127.9 kg)  11/12/22 286 lb 3.2 oz (129.8 kg)    Physical Exam Vitals reviewed.  Constitutional:      Appearance: Normal appearance.  HENT:     Nose: Nose normal.     Mouth/Throat:     Mouth: Mucous membranes are moist.  Eyes:     General: No scleral icterus.    Conjunctiva/sclera: Conjunctivae normal.  Cardiovascular:     Rate and Rhythm: Normal rate and regular rhythm.     Heart sounds: No murmur heard. Pulmonary:     Effort: Pulmonary effort is normal.     Breath sounds: No stridor. No wheezing, rhonchi or rales.  Abdominal:     General: Abdomen is flat.     Palpations: There is no mass.     Tenderness: There is no abdominal tenderness. There is no guarding.     Hernia: No hernia is present.  Musculoskeletal:        General: Normal range of motion.     Cervical back: Neck supple.     Right lower leg: No edema.     Left lower leg: No edema.  Lymphadenopathy:     Cervical: No cervical adenopathy.  Skin:    General: Skin is warm and dry.  Neurological:     General: No focal deficit present.     Mental Status: He is alert. Mental status is at baseline.  Psychiatric:        Mood and Affect: Mood normal.        Behavior: Behavior normal.      Lab Results  Component Value Date   WBC 9.3 11/15/2022   HGB 14.9 11/15/2022   HCT 43.8 11/15/2022   PLT 176 11/15/2022   GLUCOSE 87 01/18/2023   CHOL 186 06/09/2022   TRIG 94.0 06/09/2022   HDL 36.40 (L) 06/09/2022   LDLDIRECT 138 (H) 10/21/2016   LDLCALC 131 (H) 06/09/2022   ALT 13 11/15/2022   AST 16 11/15/2022   NA 141 01/18/2023   K 4.6 01/18/2023   CL 104 01/18/2023   CREATININE 1.09 01/18/2023   BUN 19 01/18/2023   CO2 30 01/18/2023   TSH 1.85 06/09/2022  PSA 0.24 01/18/2023   INR 2.1 04/01/2018   HGBA1C 5.8 01/18/2023    No results found.  Assessment & Plan:   Hyperaldosteronism (HCC)- BP is well controlled. -     Basic metabolic panel; Future  Benign prostatic hyperplasia (BPH) with straining on urination -     Tamsulosin  HCl; Take 1 capsule (0.4 mg total) by mouth daily.  Dispense: 90 capsule; Refill: 1 -     Urinalysis, Routine w reflex microscopic; Future -     PSA; Future  Chronic hyperglycemia -     Basic metabolic panel; Future -     Hemoglobin A1c; Future  Essential hypertension -     Urinalysis, Routine w reflex microscopic; Future  Moderate episode of recurrent major depressive disorder (HCC) -     Escitalopram  Oxalate; Take 1 tablet (20 mg total) by mouth daily.  Dispense: 90 tablet; Refill: 1  Acute prostatitis -     Ciprofloxacin  HCl; Take 1 tablet (500 mg total) by mouth 2 (two) times daily for 21 days.  Dispense: 42 tablet; Refill: 0     Follow-up: Return in about 6 months (around 07/18/2023).  Debby Molt, MD

## 2023-01-19 DIAGNOSIS — M47816 Spondylosis without myelopathy or radiculopathy, lumbar region: Secondary | ICD-10-CM | POA: Diagnosis not present

## 2023-01-19 DIAGNOSIS — R260 Ataxic gait: Secondary | ICD-10-CM | POA: Diagnosis not present

## 2023-01-21 DIAGNOSIS — R260 Ataxic gait: Secondary | ICD-10-CM | POA: Diagnosis not present

## 2023-01-21 DIAGNOSIS — M47816 Spondylosis without myelopathy or radiculopathy, lumbar region: Secondary | ICD-10-CM | POA: Diagnosis not present

## 2023-01-23 ENCOUNTER — Encounter: Payer: Self-pay | Admitting: Physical Medicine & Rehabilitation

## 2023-01-25 LAB — GENECONNECT MOLECULAR SCREEN: Genetic Analysis Overall Interpretation: NEGATIVE

## 2023-01-26 DIAGNOSIS — R260 Ataxic gait: Secondary | ICD-10-CM | POA: Diagnosis not present

## 2023-01-26 DIAGNOSIS — M47816 Spondylosis without myelopathy or radiculopathy, lumbar region: Secondary | ICD-10-CM | POA: Diagnosis not present

## 2023-01-28 DIAGNOSIS — R260 Ataxic gait: Secondary | ICD-10-CM | POA: Diagnosis not present

## 2023-01-28 DIAGNOSIS — M47816 Spondylosis without myelopathy or radiculopathy, lumbar region: Secondary | ICD-10-CM | POA: Diagnosis not present

## 2023-01-29 DIAGNOSIS — Z961 Presence of intraocular lens: Secondary | ICD-10-CM | POA: Diagnosis not present

## 2023-01-29 DIAGNOSIS — H02889 Meibomian gland dysfunction of unspecified eye, unspecified eyelid: Secondary | ICD-10-CM | POA: Diagnosis not present

## 2023-01-29 DIAGNOSIS — H43813 Vitreous degeneration, bilateral: Secondary | ICD-10-CM | POA: Diagnosis not present

## 2023-01-29 DIAGNOSIS — H43393 Other vitreous opacities, bilateral: Secondary | ICD-10-CM | POA: Diagnosis not present

## 2023-01-31 ENCOUNTER — Encounter: Payer: Self-pay | Admitting: Internal Medicine

## 2023-02-02 DIAGNOSIS — M47816 Spondylosis without myelopathy or radiculopathy, lumbar region: Secondary | ICD-10-CM | POA: Diagnosis not present

## 2023-02-02 DIAGNOSIS — R260 Ataxic gait: Secondary | ICD-10-CM | POA: Diagnosis not present

## 2023-02-04 DIAGNOSIS — R260 Ataxic gait: Secondary | ICD-10-CM | POA: Diagnosis not present

## 2023-02-04 DIAGNOSIS — M47816 Spondylosis without myelopathy or radiculopathy, lumbar region: Secondary | ICD-10-CM | POA: Diagnosis not present

## 2023-02-08 ENCOUNTER — Other Ambulatory Visit: Payer: Self-pay | Admitting: Internal Medicine

## 2023-02-08 DIAGNOSIS — L281 Prurigo nodularis: Secondary | ICD-10-CM

## 2023-02-08 DIAGNOSIS — G2581 Restless legs syndrome: Secondary | ICD-10-CM

## 2023-02-08 MED ORDER — GABAPENTIN 800 MG PO TABS: 800.0000 mg | ORAL_TABLET | Freq: Every day | ORAL | 0 refills | Status: DC

## 2023-02-09 DIAGNOSIS — R260 Ataxic gait: Secondary | ICD-10-CM | POA: Diagnosis not present

## 2023-02-09 DIAGNOSIS — M47816 Spondylosis without myelopathy or radiculopathy, lumbar region: Secondary | ICD-10-CM | POA: Diagnosis not present

## 2023-02-11 DIAGNOSIS — R260 Ataxic gait: Secondary | ICD-10-CM | POA: Diagnosis not present

## 2023-02-11 DIAGNOSIS — M47816 Spondylosis without myelopathy or radiculopathy, lumbar region: Secondary | ICD-10-CM | POA: Diagnosis not present

## 2023-02-14 ENCOUNTER — Other Ambulatory Visit: Payer: Self-pay | Admitting: Internal Medicine

## 2023-02-14 DIAGNOSIS — R4189 Other symptoms and signs involving cognitive functions and awareness: Secondary | ICD-10-CM

## 2023-02-15 ENCOUNTER — Encounter: Payer: Medicare Other | Attending: Physical Medicine & Rehabilitation | Admitting: Physical Medicine & Rehabilitation

## 2023-02-15 ENCOUNTER — Encounter: Payer: Self-pay | Admitting: Physical Medicine & Rehabilitation

## 2023-02-15 VITALS — BP 102/68 | HR 72 | Ht 76.0 in | Wt 279.0 lb

## 2023-02-15 DIAGNOSIS — G894 Chronic pain syndrome: Secondary | ICD-10-CM | POA: Insufficient documentation

## 2023-02-15 DIAGNOSIS — Z79891 Long term (current) use of opiate analgesic: Secondary | ICD-10-CM | POA: Insufficient documentation

## 2023-02-15 DIAGNOSIS — F32A Depression, unspecified: Secondary | ICD-10-CM | POA: Diagnosis not present

## 2023-02-15 DIAGNOSIS — Z5181 Encounter for therapeutic drug level monitoring: Secondary | ICD-10-CM | POA: Diagnosis not present

## 2023-02-15 DIAGNOSIS — M47817 Spondylosis without myelopathy or radiculopathy, lumbosacral region: Secondary | ICD-10-CM | POA: Diagnosis not present

## 2023-02-15 DIAGNOSIS — M545 Low back pain, unspecified: Secondary | ICD-10-CM | POA: Insufficient documentation

## 2023-02-15 DIAGNOSIS — G8929 Other chronic pain: Secondary | ICD-10-CM | POA: Insufficient documentation

## 2023-02-15 NOTE — Progress Notes (Signed)
 Subjective:    Patient ID: Brandon Ege., male    DOB: 10/20/1950, 73 y.o.   MRN: [id number]  HPI  HPI   Brandon Duet. is a 73 y.o. year old male  who  has a past medical history of Anxiety, Arthritis, Benign neoplasm of colon, Benign prostatic hyperplasia with urinary obstruction, Bilateral inguinal hernia (05/29/2011), Bilateral pulmonary embolism (HCC) (3/14), Breast lump (03/29/2019), Calculus of kidney (10/21/2016), Cataract, Chronic bilateral low back pain with sciatica (03/21/2012), Chronic pain, Finger mass, right (10/17/2020), History of alcohol abuse (03/07/2016), History of colon polyps (11/18/2015), History of kidney stones, History of pulmonary embolism (03/19/2012), Hyperlipemia, Hypertension, Insomnia, Insomnia due to medical condition (12/23/2016), Major depressive disorder, recurrent episode (HCC), Mitral regurgitation (04/24/2013), Morbid obesity (HCC) (03/07/2016), Nonrheumatic mitral valve insufficiency (04/25/2013), Obesity, OSA (obstructive sleep apnea), Osteoporosis, Persistent atrial fibrillation (HCC), Recurrent unilateral inguinal hernia (07/17/2013), Restless leg syndrome, S/P right total knee arthroplasty (05/24/2018), Sciatica, Spinal stenosis, lumbar region with neurogenic claudication, SVT (supraventricular tachycardia) (HCC) (03/07/2016), Thrombus of left atrial appendage (03/09/2013), Uncomplicated asthma (06/20/2008), and Ventral hernia, unspecified, without mention of obstruction or gangrene.   They are presenting to PM&R clinic as a new patient for pain management evaluation. They were referred by Dr. Rochelle Chu for treatment of lumbar spinal stenosis pain.  Brandon [name] reports progressive worsening pain in his lower back that radiates to his outer and inner thighs.  He says this pain has been worsening gradually over many years.  Pain is worsened with standing up and walking.  Pain has a squeezing quality and he says the pain worsens with each step when he is walking.  Pain is improved  with bending forward or sitting down.  He reports having a history of scoliosis.  He was previously followed by [facility].  Patient was also seen for several years by Dr. Daisey Dryer.  He had a trial of spinal cord stimulator but appears it was not beneficial enough to where he wanted to have it implanted.    Patient reports multiple spinal injections by Dr. Daisey Dryer and he thinks that injections that he had reduced his pain from several weeks.  He is not sure which injections ESI versus facet joint ablation provided him with the best relief.   Chart review indicates he had radiofrequency ablation of left L3-4, L4-5 and L5-S1 facet joints by Dr. Daisey Dryer after having a double diagnostic medial branch block with more than 50% relief.  It does not appear he had a lot of relief from the RFA.   More recently he was seen at [facility] neurosurgery.  Patient reports neurosurgery suggested he do injections again and he wanted to do them in [place] versus doing them at [facility] due to distance.  Chart review indicates neurosurgery feels that possibly upper lumbar facet joints could be contributing to his pain and they suggested upper lumbar MBB and possible RFA.   Patient reports having leg length discrepancy and has a lift in his left shoe.   Patient reports he tries to stay active and tries to exercise in a pool when he can.  He used to like to swim when he was younger.   He uses a cane for balance but does not want to use a walker.   Red flag symptoms: No red flags for back pain endorsed in Hx or ROS   Medications tried: Topical medications denies , tried CBD ointment in the past  Nsaids - limited by Afib  Tylenol   - Helps a little  Opiates  Hydrocodone  and oxycodone - help a little, helps him.  He would like to use this only intermittently when his pain is very severe during acute exacerbations Gabapentin  /- Takes TID 800mg   TCAs  -denies  SNRIs  -does not recall   Other treatments: PT- helps while he is  getting it but results havent lasted  TENs unit -denies benefit Injections -ESI, RFA, spinal cord stimulator trial in past by Dr. Daisey Dryer  Surgery denies prior spine surgery   Goals for pain management: To be more active and have decreased pain     Pain is going down his left leg to his knee    Prior UDS results:  Labs (Brief)          Component Value Date/Time    LABOPIA NEGATIVE 03/17/2012 0133    COCAINSCRNUR NEGATIVE 03/17/2012 0133    LABBENZ POSITIVE (A) 03/17/2012 0133    AMPHETMU NEGATIVE 03/17/2012 0133      Interval history 11/12/2022 Brandon [name] is here for follow-up regarding his chronic low back pain.  He had bilateral T12, L1, L2 medial branch blocks completed on 10/27/2022 by Dr. Sharl Davies.  Patient reports he did not have significant benefit after his procedure.  Patient reports he tried to go to aquatic therapy, however patient had incident with staff and left without being seen.  Pain overall unchanged since last visit.  He has not tried Lyrica  yet, has not picked up from the pharmacy.   Interval history 12/15/22 Patient is here for follow-up regarding his chronic back pain.  He continues to have significant back pain although he feels like it is improved since starting Lyrica .  Reports he is tolerating this medication well.  He continues to have pain during activities.  He is open to retrying aquatic therapy.  His activity continues to be limited, has a hard time walking into stores from the parking lot when he needs to go shopping or pick up something from the house.. Reports occasional dizziness when for standing up, this improves after a few steps.   Interval history 02/15/23 Brandon [name] is here for follow-up regarding his chronic back pain.  He reports that therapy is helping a lot and he feels like his function is much improved.  He is very happy with his treatment at [facility].  He continues to work with therapy. Patient reports his PCP has changed his medication back to  gabapentin  from Lyrica .  He feels like there was not much difference in his pain control and the gabapentin  was helping his RLS better.  Pain Inventory Average Pain 9 Pain Right Now 9 My pain is dull and stabbing  In the last 24 hours, has pain interfered with the following? General activity 9 Relation with others 9 Enjoyment of life 9 What TIME of day is your pain at its worst? morning , daytime, evening, and night Sleep (in general)  poor sleep apnea with no CPAP  Pain is worse with: walking and standing Pain improves with: rest, heat/ice, and medication Relief from Meds: 5  Family History  Problem Relation Age of Onset   Cancer Father        bone   Heart failure Mother    Hypertension Mother    Asthma Mother    Cancer Paternal Grandmother        ovarian   CVA Other        Fam Hx of multiple myeloma   Diabetes Other        Fam Hx  of DM   Social History   Socioeconomic History   Marital status: Married    Spouse name: Not on file   Number of children: 2   Years of education: college   Highest education level: Bachelor's degree (e.g., BA, AB, BS)  Occupational History   Not on file  Tobacco Use   Smoking status: Former    Current packs/day: 0.00    Average packs/day: 0.5 packs/day for 5.0 years (2.5 ttl pk-yrs)    Types: Cigarettes    Start date: 60    Quit date: 75    Years since quitting: 49.1   Smokeless tobacco: Never   Tobacco comments:    Former smoker 03/27/22  Vaping Use   Vaping status: Never Used  Substance and Sexual Activity   Alcohol use: No    Comment: Former EtOH abuse, stopped 06/2016   Drug use: No    Comment: negative hx for IV drug abuse   Sexual activity: Not on file  Other Topics Concern   Not on file  Social History Narrative   Lives with wife in [place]   Retired Investment banker, operational   Wife lives in town (still married but lives separate)   Has biological daughter in [place] (Adriana Hopping [place])   Social Drivers of Health    Financial Resource Strain: Low Risk  (01/14/2023)   Overall Financial Resource Strain (CARDIA)    Difficulty of Paying Living Expenses: Not hard at all  Food Insecurity: No Food Insecurity (01/14/2023)   Hunger Vital Sign    Worried About Running Out of Food in the Last Year: Never true    Ran Out of Food in the Last Year: Never true  Transportation Needs: No Transportation Needs (01/14/2023)   PRAPARE - Administrator, Civil Service (Medical): No    Lack of Transportation (Non-Medical): No  Physical Activity: Insufficiently Active (01/14/2023)   Exercise Vital Sign    Days of Exercise per Week: 2 days    Minutes of Exercise per Session: 50 min  Stress: Stress Concern Present (01/14/2023)   [employer] of Occupational Health - Occupational Stress Questionnaire    Feeling of Stress : Rather much  Social Connections: Socially Isolated (01/14/2023)   Social Connection and Isolation Panel [NHANES]    Frequency of Communication with Friends and Family: Never    Frequency of Social Gatherings with Friends and Family: Never    Attends Religious Services: Never    Database administrator or Organizations: No    Attends Engineer, structural: Not on file    Marital Status: Separated   Past Surgical History:  Procedure Laterality Date   ATRIAL FIBRILLATION ABLATION N/A 02/27/2022   Procedure: ATRIAL FIBRILLATION ABLATION;  Surgeon: Arlester Ladd, Donnamae Gaba, MD;  [facility] INVAS[facility] LAB;  Service: Cardiovascular;  Laterality: N/A;   CARDIOVERSION N/A 03/21/2012   Procedure: CARDIOVERSION;  Surgeon: Darrold Emms, MD;  Location: [facility] ENDOSCOPY;  Service: Cardiovascular;  Laterality: N/A;   CARDIOVERSION N/A 04/24/2013   Procedure: CARDIOVERSION;  Surgeon: Liza Riggers, MD;  Location: [facility] ENDOSCOPY;  Service: Cardiovascular;  Laterality: N/A;   CARDIOVERSION N/A 06/27/2015   Procedure: CARDIOVERSION;  Surgeon: Darlis Eisenmenger, MD;  Location: [facility] ENDOSCOPY;  Service:  Cardiovascular;  Laterality: N/A;   CARDIOVERSION N/A 11/11/2021   Procedure: CARDIOVERSION;  Surgeon: Lenise Quince, MD;  Location: [facility] ENDOSCOPY;  Service: Cardiovascular;  Laterality: N/A;   CARDIOVERSION N/A 05/11/2022   Procedure: CARDIOVERSION;  Surgeon: Jann Melody, MD;  [facility];  Service: Cardiovascular;  Laterality: N/A;   CARDIOVERSION N/A 07/13/2022   Procedure: CARDIOVERSION;  Surgeon: Luana Rumple, MD;  [facility]: [facility];  Service: Cardiovascular;  Laterality: N/A;   CATARACT EXTRACTION     COLONOSCOPY W/ POLYPECTOMY     ELECTROPHYSIOLOGIC STUDY N/A 07/30/2015   Procedure: Atrial Fibrillation Ablation;  Surgeon: Jolly Needle, MD;  Location: [facility] INVASIVE CV LAB;  Service: Cardiovascular;  Laterality: N/A;   EXTRACORPOREAL SHOCK WAVE LITHOTRIPSY Right 10/29/2016   Procedure: RIGHT EXTRACORPOREAL SHOCK WAVE LITHOTRIPSY (ESWL);  Surgeon: Osborn Blaze, MD;  Location: [facility] ORS;  Service: Urology;  Laterality: Right;   EYE SURGERY Right    cataract   FEMUR FRACTURE SURGERY     HERNIA REPAIR  07/06/2011   INGUINAL HERNIA REPAIR Right 07/28/2013   Procedure: RIGHT INGUINAL HERNIA REPAIR;  Surgeon: Kari Otto. Eli Grizzle, MD;  Location: [facility] OR;  Service: General;  Laterality: Right;   INGUINAL HERNIA REPAIR Right 09/22/2016   Procedure: LAPAROSCOPIC RIGHT INGUINAL HERNIA;  Surgeon: Dorrie Gaudier Alphonso Aschoff, MD;  Location: [facility] ORS;  Service: General;  Laterality: Right;  With MESH   INSERTION OF MESH Right 07/28/2013   Procedure: INSERTION OF MESH;  Surgeon: Kari Otto. Eli Grizzle, MD;  Location: [facility] OR;  Service: General;  Laterality: Right;   KNEE ARTHROSCOPY     left   REPLACEMENT TOTAL KNEE Left    ROTATOR CUFF REPAIR     right   ROTATOR CUFF REPAIR Left 03/08/2014   DR SUPPLE   SHOULDER ARTHROSCOPY WITH ROTATOR CUFF REPAIR AND SUBACROMIAL DECOMPRESSION Left 03/08/2014   Procedure: LEFT SHOULDER ARTHROSCOPY WITH ROTATOR CUFF REPAIR/SUBACROMIAL  DECOMPRESSION/DISTAL CLAVICLE RESECTION;  Surgeon: Glo Larch, MD;  Location: [facility];  Service: Orthopedics;  Laterality: Left;   SVT ABLATION N/A 01/23/2019   Procedure: SVT ABLATION;  Surgeon: Tammie Fall, MD;  Location: [facility] INVASIVE CV LAB;  Service: Cardiovascular;  Laterality: N/A;   TEE WITHOUT CARDIOVERSION N/A 03/21/2012   Procedure: TRANSESOPHAGEAL ECHOCARDIOGRAM (TEE);  Surgeon: Darrold Emms, MD;  Location: [facility] ENDOSCOPY;  Service: Cardiovascular;  Laterality: N/A;   TEE WITHOUT CARDIOVERSION N/A 04/24/2013   Procedure: TRANSESOPHAGEAL ECHOCARDIOGRAM (TEE);  Surgeon: Liza Riggers, MD;  Location: [facility] ENDOSCOPY;  Service: Cardiovascular;  Laterality: N/A;   TEE WITHOUT CARDIOVERSION N/A 07/29/2015   Procedure: TRANSESOPHAGEAL ECHOCARDIOGRAM (TEE);  Surgeon: Elmyra Haggard, MD;  Location: [facility] ENDOSCOPY;  Service: Cardiovascular;  Laterality: N/A;   TEE WITHOUT CARDIOVERSION N/A 05/11/2022   Procedure: TRANSESOPHAGEAL ECHOCARDIOGRAM;  Surgeon: Jann Melody, MD;  [facility];  Service: Cardiovascular;  Laterality: N/A;   TONSILLECTOMY     TOTAL KNEE ARTHROPLASTY Right 05/24/2018   Procedure: RIGHT TOTAL KNEE ARTHROPLASTY;  Surgeon: Claiborne Crew, MD;  Location: [facility];  Service: Orthopedics;  Laterality: Right;  70 mins   Past Surgical History:  Procedure Laterality Date   ATRIAL FIBRILLATION ABLATION N/A 02/27/2022   Procedure: ATRIAL FIBRILLATION ABLATION;  Surgeon: Efraim Grange, MD;  [facility];  Service: Cardiovascular;  Laterality: N/A;   CARDIOVERSION N/A 03/21/2012   Procedure: CARDIOVERSION;  Surgeon: Darrold Emms, MD;  Location: [facility] ENDOSCOPY;  Service: Cardiovascular;  Laterality: N/A;   CARDIOVERSION N/A 04/24/2013   Procedure: CARDIOVERSION;  Surgeon: Liza Riggers, MD;  Location: [facility] ENDOSCOPY;  Service: Cardiovascular;  Laterality: N/A;   CARDIOVERSION N/A 06/27/2015   Procedure: CARDIOVERSION;  Surgeon: Darlis Eisenmenger,  MD;  [facility] ENDOSCOPY;  Service: Cardiovascular;  Laterality: N/A;   CARDIOVERSION N/A 11/11/2021   Procedure: CARDIOVERSION;  Surgeon: Lenise Quince, MD;  Location: [facility] ENDOSCOPY;  Service: Cardiovascular;  Laterality: N/A;   CARDIOVERSION N/A 05/11/2022   Procedure: CARDIOVERSION;  Surgeon: Jann Melody, MD;  [facility];  Service: Cardiovascular;  Laterality: N/A;   CARDIOVERSION N/A 07/13/2022   Procedure: CARDIOVERSION;  Surgeon: Luana Rumple, MD;  [facility];  Service: Cardiovascular;  Laterality: N/A;   CATARACT EXTRACTION     COLONOSCOPY W/ POLYPECTOMY     ELECTROPHYSIOLOGIC STUDY N/A 07/30/2015   Procedure: Atrial Fibrillation Ablation;  Surgeon: Jolly Needle, MD;  Location: [facility] INVASIVE CV LAB;  Service: Cardiovascular;  Laterality: N/A;   EXTRACORPOREAL SHOCK WAVE LITHOTRIPSY Right 10/29/2016   Procedure: RIGHT EXTRACORPOREAL SHOCK WAVE LITHOTRIPSY (ESWL);  Surgeon: Osborn Blaze, MD;  Location[facility];  Service: Urology;  Laterality: Right;   EYE SURGERY Right    cataract   FEMUR FRACTURE SURGERY     HERNIA REPAIR  07/06/2011   INGUINAL HERNIA REPAIR Right 07/28/2013   Procedure: RIGHT INGUINAL HERNIA REPAIR;  Surgeon: Kari Otto. Eli Grizzle, MD;  [facility];  Service: General;  Laterality: Right;   INGUINAL HERNIA REPAIR Right 09/22/2016   Procedure: LAPAROSCOPIC RIGHT INGUINAL HERNIA;  Surgeon: Dorrie Gaudier Alphonso Aschoff, MD;  Location: [facility];  Service: General;  Laterality: Right;  With MESH   INSERTION OF MESH Right 07/28/2013   Procedure: INSERTION OF MESH;  Surgeon: Kari Otto. Eli Grizzle, MD;  [facility];  Service: General;  Laterality: Right;   KNEE ARTHROSCOPY     left   REPLACEMENT TOTAL KNEE Left    ROTATOR CUFF REPAIR     right   ROTATOR CUFF REPAIR Left 03/08/2014   DR SUPPLE   SHOULDER ARTHROSCOPY WITH ROTATOR CUFF REPAIR AND SUBACROMIAL DECOMPRESSION Left 03/08/2014   Procedure: LEFT SHOULDER ARTHROSCOPY WITH ROTATOR  CUFF REPAIR/SUBACROMIAL DECOMPRESSION/DISTAL CLAVICLE RESECTION;  Surgeon: Glo Larch, MD;  [facility];  Service: Orthopedics;  Laterality: Left;   SVT ABLATION N/A 01/23/2019   Procedure: SVT ABLATION;  Surgeon: Tammie Fall, MD;  Location: [facility] INVASIVE CV LAB;  Service: Cardiovascular;  Laterality: N/A;   TEE WITHOUT CARDIOVERSION N/A 03/21/2012   Procedure: TRANSESOPHAGEAL ECHOCARDIOGRAM (TEE);  Surgeon: Darrold Emms, MD;  Location: [facility] ENDOSCOPY;  Service: Cardiovascular;  Laterality: N/A;   TEE WITHOUT CARDIOVERSION N/A 04/24/2013   Procedure: TRANSESOPHAGEAL ECHOCARDIOGRAM (TEE);  Surgeon: Liza Riggers, MD;  Location: [facility] ENDOSCOPY;  Service: Cardiovascular;  Laterality: N/A;   TEE WITHOUT CARDIOVERSION N/A 07/29/2015   Procedure: TRANSESOPHAGEAL ECHOCARDIOGRAM (TEE);  Surgeon: Elmyra Haggard, MD;  Location: [facility] ENDOSCOPY;  Service: Cardiovascular;  Laterality: N/A;   TEE WITHOUT CARDIOVERSION N/A 05/11/2022   Procedure: TRANSESOPHAGEAL ECHOCARDIOGRAM;  Surgeon: Jann Melody, MD;  [facility];  Service: Cardiovascular;  Laterality: N/A;   TONSILLECTOMY     TOTAL KNEE ARTHROPLASTY Right 05/24/2018   Procedure: RIGHT TOTAL KNEE ARTHROPLASTY;  Surgeon: Claiborne Crew, MD;  Location: [facility];  Service: Orthopedics;  Laterality: Right;  70 mins   Past Medical History:  Diagnosis Date   Anxiety    Arthritis    Benign neoplasm of colon    Benign prostatic hyperplasia with urinary obstruction    Bilateral inguinal hernia 05/29/2011   Bilateral pulmonary embolism (HCC) 3/14   admitted to [facility],  treated with Xarelto    Breast lump 03/29/2019  Calculus of kidney 10/21/2016   Cataract    Chronic bilateral low back pain with sciatica 03/21/2012   Chronic pain    Finger mass, right 10/17/2020   History of alcohol abuse 03/07/2016   History of colon polyps 11/18/2015   History of kidney stones    History of pulmonary embolism 03/19/2012   Hyperlipemia     Hypertension    Insomnia    Insomnia due to medical condition 12/23/2016   Polyuria secondary to BPH   Major depressive disorder, recurrent episode (HCC)    Mitral regurgitation 04/24/2013   Mild by TEE   Morbid obesity (HCC) 03/07/2016   Nonrheumatic mitral valve insufficiency 04/25/2013   Mild by TEE  Overview:  Overview:  Mild by TEE Overview:  Overview:  Overview:  Mild by TEE Overview:  Overview:  Mild by TEE   Obesity    OSA (obstructive sleep apnea)    noncompliant with CPAP.  07/27/13- awaiting a CPAP- unable to tolerate mask   Osteoporosis    Persistent atrial fibrillation (HCC)    Recurrent unilateral inguinal hernia 07/17/2013   Restless leg syndrome    takes gabapentin    S/P right total knee arthroplasty 05/24/2018   Sciatica    Spinal stenosis, lumbar region with neurogenic claudication    SVT (supraventricular tachycardia) (HCC) 03/07/2016   Thrombus of left atrial appendage 03/09/2013   Uncomplicated asthma 06/20/2008   Overview:  Overview:  Overview:  Overview:  Qualifier: Diagnosis of  By: Ena Harries MD, Viktoria Gray Overview:  Overview:  Qualifier: Diagnosis of  By: Ena Harries MD, Viktoria Gray   Ventral hernia, unspecified, without mention of obstruction or gangrene    right abdominal wall   Wt 279 lb (126.6 kg)   BMI 33.96 kg/m   Opioid Risk Score:   Fall Risk Score:  `1  Depression screen [facility] 2/9     12/15/2022    9:48 AM 11/12/2022    9:47 AM 10/29/2022   10:21 AM 09/22/2022   10:43 AM 06/09/2022   11:33 AM 02/12/2022    2:21 PM 12/10/2021   10:55 AM  Depression screen PHQ 2/9  Decreased Interest 0 1  1 0 1 0  Down, Depressed, Hopeless 0 1 1 1  0 1 0  PHQ - 2 Score 0 2 1 2  0 2 0  Altered sleeping   1 0 0 3   Tired, decreased energy    2 0 3   Change in appetite    2 0 2   Feeling bad or failure about yourself     3 0 0   Trouble concentrating    2 0 1   Moving slowly or fidgety/restless    2 0 0   Suicidal thoughts    1 0 0   PHQ-9 Score   2 14 0 11   Difficult doing  work/chores    Very difficult  Somewhat difficult       Review of Systems  Musculoskeletal:  Positive for back pain.  All other systems reviewed and are negative.      Objective:   Physical Exam     02/15/2023   10:33 AM 01/18/2023    9:12 AM 12/15/2022    9:45 AM  Vitals with BMI  Height  6\' 4"  6\' 4"   Weight 279 lbs 287 lbs 282 lbs  BMI  34.95 34.34  Systolic  122 123  Diastolic  76 82  Pulse  65 74    Gen: no distress,  normal appearing HEENT: oral mucosa pink and moist Chest: normal effort, normal rate of breathing Abd: soft, non-distended Ext: no edema Psych: Appropriate, pleasant Skin: intact Neuro: Alert and oriented, follows commands, cranial nerves II through XII grossly intact, normal speech and language Moving all 4 extremities to gravity and resistance Sensory exam normal for light touch and pain in all 4 limbs.  No ankle clonus DTR absent bilateral patella and right ankle, DTR 1+ left ankle- not checked today   Musculoskeletal:  Minimal tenderness to palpation about bilateral paraspinal muscles in the lumbar spine No significant cervical paraspinal tenderness Kyphotic posture Slump test negative bilaterally Facet loading positive and pain with spinal extension-unchanged   MRI from [facility] note 05/14/22 Lumbar MRI 05/13/22: Anatomical variants: none Alignment: Degenerative levoscoliosis. Conus medullaris: terminates at approximately L1. Spinal Cord and Cauda Equina: Visualized portions of the spinal cord is normal in morphology and signal. Normal appearance of the cauda equina.  Bone marrow signal: No suspicious lesions.  T12-L1: Mild disc and facet degenerative changes resulting in mild spinal canal stenosis.  L1-L2: Unremarkable.  L2-L3: Mild disc and facet degenerative changes without significant spinal canal or neural foraminal stenosis.  L3-L4: Disc bulge and facet arthropathy resulting in mild canal stenosis.  L4-L5: Mild disc and facet  degenerative changes resulting in mild canal stenosis.  L5-S1: Advanced bilateral facet arthropathy and mild disc degenerative changes resulting in mild left neural foraminal stenosis.  IMPRESSION: Multilevel disc and facet degenerative changes above without high-grade stenosis.    L spine Xray from [facility] note  Spine x-rays 04/01/22: IMPRESSION: 1. S-shaped scoliosis of the thoracolumbar spine with positive sagittal and coronal imbalance. 2. Mild stepwise retrolisthesis from L2 to L5 without evidence of dynamic instability. 3. 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       Assessment & Plan:   1) Chronic low back pain with degenerative spinal canal stenosis and multilevel lumbar facet joint degeneration   2) Depression, denies SI or HI. On Lexapro  20 mg daily  3) Dizziness when getting up intermittently.  Suspect orthostatic hypotension     -Discussed foods that are helpful for pain -Patient was previously started on Lyrica  100 mg 3 times daily.  PCP has restarted gabapentin , he feels like it was better for his RLS.  He did not notice a significant pain improvement with the Lyrica  over gabapentin .  Continue current dose of gabapentin . -Initially plan to refer to Dr. Sharl Davies for [facility] as patient indicated this was helpful in the past.  On chart review it appears there is some degeneration in upper facet joints of the lumbar spine, will discuss possibility of medial branch block/RFA to upper lumbar spine.  Lower lumbar spine RFA was completed previously by Dr. Daisey Dryer.  Patient was referred for medial branch block of upper lumbar spine.  Medial branch block bilateral T12, L1-L2 and this was completed on 10/24 without significant benefit reported. -Prior visit I placed referral to psychiatry for medication management due to continued depression -Consult placed last visit for aquatic therapy-MediQ placed-reports this has been very helpful thus  far -Compression stockings to prevent orthostatic hypotension symptoms -Will check oral drug screen today, could consider tramadol  at a later visit.  Check ORT

## 2023-02-16 DIAGNOSIS — M47816 Spondylosis without myelopathy or radiculopathy, lumbar region: Secondary | ICD-10-CM | POA: Diagnosis not present

## 2023-02-16 DIAGNOSIS — R260 Ataxic gait: Secondary | ICD-10-CM | POA: Diagnosis not present

## 2023-02-18 ENCOUNTER — Other Ambulatory Visit: Payer: Self-pay | Admitting: Internal Medicine

## 2023-02-18 DIAGNOSIS — R260 Ataxic gait: Secondary | ICD-10-CM | POA: Diagnosis not present

## 2023-02-18 DIAGNOSIS — M47816 Spondylosis without myelopathy or radiculopathy, lumbar region: Secondary | ICD-10-CM | POA: Diagnosis not present

## 2023-02-18 DIAGNOSIS — E876 Hypokalemia: Secondary | ICD-10-CM

## 2023-02-18 DIAGNOSIS — E269 Hyperaldosteronism, unspecified: Secondary | ICD-10-CM

## 2023-02-18 LAB — DRUG TOX MONITOR 1 W/CONF, ORAL FLD
Amphetamines: NEGATIVE ng/mL (ref <10)
Barbiturates: NEGATIVE ng/mL (ref <10)
Benzodiazepines: NEGATIVE ng/mL (ref <0.50)
Buprenorphine: NEGATIVE ng/mL (ref <0.10)
Cocaine: NEGATIVE ng/mL (ref <5.0)
Fentanyl: NEGATIVE ng/mL (ref <0.10)
Heroin Metabolite: NEGATIVE ng/mL (ref <1.0)
MARIJUANA: POSITIVE ng/mL — AB (ref <2.5)
MDMA: NEGATIVE ng/mL (ref <10)
Meprobamate: NEGATIVE ng/mL (ref <2.5)
Methadone: NEGATIVE ng/mL (ref <5.0)
Nicotine Metabolite: NEGATIVE ng/mL (ref <5.0)
Opiates: NEGATIVE ng/mL (ref <2.5)
Phencyclidine: NEGATIVE ng/mL (ref <10)
THC: >250 ng/mL — ABNORMAL HIGH (ref <2.5)
Tapentadol: NEGATIVE ng/mL (ref <5.0)
Tramadol: NEGATIVE ng/mL (ref <5.0)
Zolpidem: NEGATIVE ng/mL (ref <5.0)

## 2023-02-18 LAB — DRUG TOX ALC METAB W/CON, ORAL FLD: Alcohol Metabolite: NEGATIVE ng/mL (ref <25)

## 2023-02-23 DIAGNOSIS — M47816 Spondylosis without myelopathy or radiculopathy, lumbar region: Secondary | ICD-10-CM | POA: Diagnosis not present

## 2023-02-23 DIAGNOSIS — R260 Ataxic gait: Secondary | ICD-10-CM | POA: Diagnosis not present

## 2023-02-25 DIAGNOSIS — M47816 Spondylosis without myelopathy or radiculopathy, lumbar region: Secondary | ICD-10-CM | POA: Diagnosis not present

## 2023-02-25 DIAGNOSIS — R260 Ataxic gait: Secondary | ICD-10-CM | POA: Diagnosis not present

## 2023-03-02 DIAGNOSIS — M47816 Spondylosis without myelopathy or radiculopathy, lumbar region: Secondary | ICD-10-CM | POA: Diagnosis not present

## 2023-03-02 DIAGNOSIS — R260 Ataxic gait: Secondary | ICD-10-CM | POA: Diagnosis not present

## 2023-03-04 DIAGNOSIS — M47816 Spondylosis without myelopathy or radiculopathy, lumbar region: Secondary | ICD-10-CM | POA: Diagnosis not present

## 2023-03-04 DIAGNOSIS — R260 Ataxic gait: Secondary | ICD-10-CM | POA: Diagnosis not present

## 2023-03-09 DIAGNOSIS — R260 Ataxic gait: Secondary | ICD-10-CM | POA: Diagnosis not present

## 2023-03-09 DIAGNOSIS — M47816 Spondylosis without myelopathy or radiculopathy, lumbar region: Secondary | ICD-10-CM | POA: Diagnosis not present

## 2023-03-11 DIAGNOSIS — M47816 Spondylosis without myelopathy or radiculopathy, lumbar region: Secondary | ICD-10-CM | POA: Diagnosis not present

## 2023-03-11 DIAGNOSIS — R260 Ataxic gait: Secondary | ICD-10-CM | POA: Diagnosis not present

## 2023-03-14 ENCOUNTER — Encounter: Payer: Self-pay | Admitting: Physical Medicine & Rehabilitation

## 2023-03-27 ENCOUNTER — Encounter: Payer: Self-pay | Admitting: Internal Medicine

## 2023-03-29 ENCOUNTER — Other Ambulatory Visit: Payer: Self-pay | Admitting: Cardiovascular Disease

## 2023-03-30 ENCOUNTER — Emergency Department (HOSPITAL_COMMUNITY)
Admission: EM | Admit: 2023-03-30 | Discharge: 2023-03-30 | Discharge status: Against Medical Advice | Attending: Nurse Practitioner | Admitting: Nurse Practitioner

## 2023-03-30 DIAGNOSIS — R112 Nausea with vomiting, unspecified: Secondary | ICD-10-CM | POA: Insufficient documentation

## 2023-03-30 DIAGNOSIS — I10 Essential (primary) hypertension: Secondary | ICD-10-CM | POA: Diagnosis not present

## 2023-03-30 DIAGNOSIS — Z5321 Procedure and treatment not carried out due to patient leaving prior to being seen by health care provider: Secondary | ICD-10-CM | POA: Insufficient documentation

## 2023-03-30 DIAGNOSIS — R109 Unspecified abdominal pain: Secondary | ICD-10-CM | POA: Diagnosis not present

## 2023-03-30 DIAGNOSIS — K2970 Gastritis, unspecified, without bleeding: Secondary | ICD-10-CM | POA: Diagnosis not present

## 2023-03-30 LAB — CBC WITH DIFFERENTIAL/PLATELET
Abs Immature Granulocytes: 0.04 10*3/uL (ref 0.00–0.07)
Basophils Absolute: 0 10*3/uL (ref 0.0–0.1)
Basophils Relative: 0 %
Eosinophils Absolute: 0 10*3/uL (ref 0.0–0.5)
Eosinophils Relative: 0 %
HCT: 46.3 % (ref 39.0–52.0)
Hemoglobin: 16 g/dL (ref 13.0–17.0)
Immature Granulocytes: 0 %
Lymphocytes Relative: 12 %
Lymphs Abs: 1.2 10*3/uL (ref 0.7–4.0)
MCH: 30.1 pg (ref 26.0–34.0)
MCHC: 34.6 g/dL (ref 30.0–36.0)
MCV: 87.2 fL (ref 80.0–100.0)
Monocytes Absolute: 0.8 10*3/uL (ref 0.1–1.0)
Monocytes Relative: 8 %
Neutro Abs: 7.8 10*3/uL — ABNORMAL HIGH (ref 1.7–7.7)
Neutrophils Relative %: 80 %
Platelets: 200 10*3/uL (ref 150–400)
RBC: 5.31 MIL/uL (ref 4.22–5.81)
RDW: 13.4 % (ref 11.5–15.5)
WBC: 10 10*3/uL (ref 4.0–10.5)
nRBC: 0 % (ref 0.0–0.2)

## 2023-03-30 LAB — COMPREHENSIVE METABOLIC PANEL
ALT: 12 U/L (ref 0–44)
AST: 23 U/L (ref 15–41)
Albumin: 4.8 g/dL (ref 3.5–5.0)
Alkaline Phosphatase: 81 U/L (ref 38–126)
Anion gap: 15 (ref 5–15)
BUN: 17 mg/dL (ref 8–23)
CO2: 20 mmol/L — ABNORMAL LOW (ref 22–32)
Calcium: 9.6 mg/dL (ref 8.9–10.3)
Chloride: 102 mmol/L (ref 98–111)
Creatinine, Ser: 1.09 mg/dL (ref 0.61–1.24)
GFR, Estimated: >60 mL/min (ref >60)
Glucose, Bld: 136 mg/dL — ABNORMAL HIGH (ref 70–99)
Potassium: 3.4 mmol/L — ABNORMAL LOW (ref 3.5–5.1)
Sodium: 137 mmol/L (ref 135–145)
Total Bilirubin: 1.4 mg/dL — ABNORMAL HIGH (ref 0.0–1.2)
Total Protein: 8.3 g/dL — ABNORMAL HIGH (ref 6.5–8.1)

## 2023-03-30 MED ORDER — HALOPERIDOL LACTATE 5 MG/ML IJ SOLN
5.0000 mg | Freq: Once | INTRAMUSCULAR | Status: AC
  Administered 2023-03-30: 5 mg via INTRAVENOUS
  Filled 2023-03-30: qty 1

## 2023-03-30 NOTE — ED Triage Notes (Signed)
 Pt arrives via [facility] from home c/o abd pain and N/V that started last night. Pt has hx of cannabis hyperemesis and states that this pain feels similar. Pt given 4 mg zofran IV enroute by EMS.

## 2023-03-30 NOTE — ED Provider Triage Note (Signed)
 Emergency Medicine Provider Triage Evaluation Note  Brandon [name]. , a 73 y.o. male  was evaluated in triage.  Pt complains of nausea, vomiting, and abdominal pain that started last night. History of CHS, and states pain feels similar. Patient has tried capsaicin, hot shower. EMS has given zofran, no effect.  Review of Systems  Positive: Nausea, vomiting, abdominal pain, chronic marijuana use Negative: Chest pain, shortness of breath  Physical Exam  BP (!) 138/97   Pulse 88   Temp 97.7 F (36.5 C) (Axillary)   Resp 20   SpO2 98%  Gen:   Awake, appears uncomfortable Resp:  Normal effort  MSK:   Moves extremities without difficulty  Other:  Abdomen soft  Medical Decision Making  Medically screening exam initiated at 3:56 PM.  Appropriate orders placed.  Brandon B Ohlsen Jr. was informed that the remainder of the evaluation will be completed by another provider, this initial triage assessment does not replace that evaluation, and the importance of remaining in the ED until their evaluation is complete.     Felicie Morn, NP 03/30/23 [phone]

## 2023-04-02 ENCOUNTER — Telehealth: Payer: Self-pay

## 2023-04-02 ENCOUNTER — Ambulatory Visit: Payer: Medicare Other

## 2023-04-02 VITALS — BP 90/60 | Ht 76.0 in | Wt 277.8 lb

## 2023-04-02 DIAGNOSIS — Z0000 Encounter for general adult medical examination without abnormal findings: Secondary | ICD-10-CM | POA: Diagnosis not present

## 2023-04-02 DIAGNOSIS — G2581 Restless legs syndrome: Secondary | ICD-10-CM

## 2023-04-02 MED ORDER — GABAPENTIN 800 MG PO TABS: 800.0000 mg | ORAL_TABLET | Freq: Every day | ORAL | 0 refills | Status: DC

## 2023-04-02 NOTE — Telephone Encounter (Signed)
 I sent a 30-day supply of gabapentin to his pharmacy.  This may not be covered by insurance since he is not ready for refill according to the instructions on the prescription.  He should touch base with Dr. Yetta Barre if he feels he needs to take this medication more often than just at night.  Not sure if the stomach pain is related to the gabapentin or other, but this would need a visit.

## 2023-04-02 NOTE — Patient Instructions (Signed)
 Mr. Croghan , Thank you for taking time to come for your Medicare Wellness Visit. I appreciate your ongoing commitment to your health goals. Please review the following plan we discussed and let me know if I can assist you in the future.   Referrals/Orders/Follow-Ups/Clinician Recommendations: It was nice meeting you today.    This is a list of the screening recommended for you and due dates:  Health Maintenance  Topic Date Due   Zoster (Shingles) Vaccine (1 of 2) Never done   DTaP/Tdap/Td vaccine (3 - Td or Tdap) 01/26/2022   COVID-19 Vaccine (7 - 2024-25 season) 09/06/2022   Medicare Annual Wellness Visit  12/11/2022   Colon Cancer Screening  12/02/2026   Pneumonia Vaccine  Completed   Flu Shot  Completed   Hepatitis C Screening  Completed   HPV Vaccine  Aged Out    Advanced directives: (Declined) Advance directive discussed with you today. Even though you declined this today, please call our office should you change your mind, and we can give you the proper paperwork for you to fill out.  Next Medicare Annual Wellness Visit scheduled for next year: Yes

## 2023-04-02 NOTE — Telephone Encounter (Signed)
 Patient complains of having stomach pain today.  Patient is requesting a new prescription for Gabapentin because, he has been taking tablets three times a day and they were prescribed to take once at bedtime.   I informed patient that I would send a message to DOD.

## 2023-04-02 NOTE — Progress Notes (Deleted)
 Subjective:   Brandon [name]. is a 73 y.o. who presents for a Medicare Wellness preventive visit.  Visit Complete: In person  Persons Participating in Visit: Patient.  AWV Questionnaire: No: Patient Medicare AWV questionnaire was not completed prior to this visit.  Cardiac Risk Factors include: advanced age (>11men, >65 women)     Objective:    Today's Vitals   04/02/23 0943 04/02/23 0944  Weight: 277 lb 12.8 oz (126 kg)   PainSc:  8    Body mass index is 33.81 kg/m.     04/02/2023    9:49 AM 03/30/2023    3:32 PM 11/15/2022    9:16 PM 10/20/2022    2:15 PM 07/13/2022    6:57 AM 05/11/2022   10:07 AM 02/27/2022    9:06 AM  Advanced Directives  Does Patient Have a Medical Advance Directive? No No No No No No No  Does patient want to make changes to medical advance directive?    No - Patient declined     Would patient like information on creating a medical advance directive?    No - Patient declined   No - Patient declined    Current Medications (verified) Outpatient Encounter Medications as of 04/02/2023  Medication Sig   amLODipine (NORVASC) 5 MG tablet Take 1 tablet (5 mg total) by mouth daily.   apixaban (ELIQUIS) 5 MG TABS tablet Take 1 tablet (5 mg total) by mouth 2 (two) times daily.   atorvastatin (LIPITOR) 40 MG tablet TAKE 1 TABLET BY MOUTH EVERY DAY   carvedilol (COREG) 25 MG tablet Take 1 tablet (25 mg total) by mouth 2 (two) times daily with a meal.   dutasteride (AVODART) 0.5 MG capsule Take 0.5 mg by mouth daily.   escitalopram (LEXAPRO) 20 MG tablet Take 1 tablet (20 mg total) by mouth daily.   flecainide (TAMBOCOR) 50 MG tablet TAKE 1 TABLET BY MOUTH TWICE A DAY   fluticasone (FLONASE) 50 MCG/ACT nasal spray SPRAY 2 SPRAYS INTO EACH NOSTRIL EVERY DAY   gabapentin (NEURONTIN) 800 MG tablet Take 1 tablet (800 mg total) by mouth at bedtime. (Patient taking differently: Take 800 mg by mouth 3 (three) times daily.)   KLOR-CON M20 20 MEQ tablet TAKE 1 TABLET BY  MOUTH EVERY DAY   memantine (NAMENDA) 10 MG tablet TAKE ONE TABLET BY MOUTH TWICE A DAY   spironolactone (ALDACTONE) 25 MG tablet TAKE ONE TABLET BY MOUTH DAILY   tamsulosin (FLOMAX) 0.4 MG CAPS capsule Take 1 capsule (0.4 mg total) by mouth daily.   triamcinolone cream (KENALOG) 0.1 % Apply 1 Application topically 2 (two) times daily as needed (rash).   No facility-administered encounter medications on file as of 04/02/2023.    Allergies (verified) Ace inhibitors and Albuterol   History: Past Medical History:  Diagnosis Date   Anxiety    Arthritis    Benign neoplasm of colon    Benign prostatic hyperplasia with urinary obstruction    Bilateral inguinal hernia 05/29/2011   Bilateral pulmonary embolism (HCC) 3/14   admitted to [facility],  treated with Xarelto   Breast lump 03/29/2019   Calculus of kidney 10/21/2016   Cataract    Chronic bilateral low back pain with sciatica 03/21/2012   Chronic pain    Finger mass, right 10/17/2020   History of alcohol abuse 03/07/2016   History of colon polyps 11/18/2015   History of kidney stones    History of pulmonary embolism 03/19/2012   Hyperlipemia  Hypertension    Insomnia    Insomnia due to medical condition 12/23/2016   Polyuria secondary to BPH   Major depressive disorder, recurrent episode (HCC)    Mitral regurgitation 04/24/2013   Mild by TEE   Morbid obesity (HCC) 03/07/2016   Nonrheumatic mitral valve insufficiency 04/25/2013   Mild by TEE  Overview:  Overview:  Mild by TEE Overview:  Overview:  Overview:  Mild by TEE Overview:  Overview:  Mild by TEE   Obesity    OSA (obstructive sleep apnea)    noncompliant with CPAP.  07/27/13- awaiting a CPAP- unable to tolerate mask   Osteoporosis    Persistent atrial fibrillation (HCC)    Recurrent unilateral inguinal hernia 07/17/2013   Restless leg syndrome    takes gabapentin   S/P right total knee arthroplasty 05/24/2018   Sciatica    Spinal stenosis, lumbar region with neurogenic  claudication    SVT (supraventricular tachycardia) (HCC) 03/07/2016   Thrombus of left atrial appendage 03/09/2013   Uncomplicated asthma 06/20/2008   Overview:  Overview:  Overview:  Overview:  Qualifier: Diagnosis of  By: Tawanna Cooler MD, Eugenio Hoes Overview:  Overview:  Qualifier: Diagnosis of  By: Tawanna Cooler MD, Eugenio Hoes   Ventral hernia, unspecified, without mention of obstruction or gangrene    right abdominal wall   Past Surgical History:  Procedure Laterality Date   ATRIAL FIBRILLATION ABLATION N/A 02/27/2022   Procedure: ATRIAL FIBRILLATION ABLATION;  Surgeon: Maurice Small, MD;  [facility]: [facility] LAB;  Service: Cardiovascular;  Laterality: N/A;   CARDIOVERSION N/A 03/21/2012   Procedure: CARDIOVERSION;  Surgeon: Ricki Rodriguez, MD;  Location: [facility] ENDOSCOPY;  Service: Cardiovascular;  Laterality: N/A;   CARDIOVERSION N/A 04/24/2013   Procedure: CARDIOVERSION;  Surgeon: Lars Masson, MD;  Location: [facility] ENDOSCOPY;  Service: Cardiovascular;  Laterality: N/A;   CARDIOVERSION N/A 06/27/2015   Procedure: CARDIOVERSION;  Surgeon: Laurey Morale, MD;  Location: [facility] ENDOSCOPY;  Service: Cardiovascular;  Laterality: N/A;   CARDIOVERSION N/A 11/11/2021   Procedure: CARDIOVERSION;  Surgeon: Lewayne Bunting, MD;  Location: [facility] ENDOSCOPY;  Service: Cardiovascular;  Laterality: N/A;   CARDIOVERSION N/A 05/11/2022   Procedure: CARDIOVERSION;  Surgeon: Christell Constant, MD;  [facility]: [facility];  Service: Cardiovascular;  Laterality: N/A;   CARDIOVERSION N/A 07/13/2022   Procedure: CARDIOVERSION;  Surgeon: Thurmon Fair, MD;  [facility]: [facility];  Service: Cardiovascular;  Laterality: N/A;   CATARACT EXTRACTION     COLONOSCOPY W/ POLYPECTOMY     ELECTROPHYSIOLOGIC STUDY N/A 07/30/2015   Procedure: Atrial Fibrillation Ablation;  Surgeon: Hillis Range, MD;  Location: [facility] INVASIVE CV LAB;  Service: Cardiovascular;  Laterality: N/A;   EXTRACORPOREAL SHOCK WAVE LITHOTRIPSY Right  10/29/2016   Procedure: RIGHT EXTRACORPOREAL SHOCK WAVE LITHOTRIPSY (ESWL);  Surgeon: Sebastian Ache, MD;  Location: [facility] ORS;  Service: Urology;  Laterality: Right;   EYE SURGERY Right    cataract   FEMUR FRACTURE SURGERY     HERNIA REPAIR  07/06/2011   INGUINAL HERNIA REPAIR Right 07/28/2013   Procedure: RIGHT INGUINAL HERNIA REPAIR;  Surgeon: Wilmon Arms. Corliss Skains, MD;  Location: [facility] OR;  Service: General;  Laterality: Right;   INGUINAL HERNIA REPAIR Right 09/22/2016   Procedure: LAPAROSCOPIC RIGHT INGUINAL HERNIA;  Surgeon: Sheliah Hatch De Blanch, MD;  Location: [facility] ORS;  Service: General;  Laterality: Right;  With MESH   INSERTION OF MESH Right 07/28/2013   Procedure: INSERTION OF MESH;  Surgeon: Wilmon Arms. Corliss Skains, MD;  Location:  [facility] OR;  Service: General;  Laterality: Right;   KNEE ARTHROSCOPY     left   REPLACEMENT TOTAL KNEE Left    ROTATOR CUFF REPAIR     right   ROTATOR CUFF REPAIR Left 03/08/2014   DR SUPPLE   SHOULDER ARTHROSCOPY WITH ROTATOR CUFF REPAIR AND SUBACROMIAL DECOMPRESSION Left 03/08/2014   Procedure: LEFT SHOULDER ARTHROSCOPY WITH ROTATOR CUFF REPAIR/SUBACROMIAL DECOMPRESSION/DISTAL CLAVICLE RESECTION;  Surgeon: Senaida Lange, MD;  Location: [facility] OR;  Service: Orthopedics;  Laterality: Left;   SVT ABLATION N/A 01/23/2019   Procedure: SVT ABLATION;  Surgeon: Marinus Maw, MD;  Location: [facility] INVASIVE CV LAB;  Service: Cardiovascular;  Laterality: N/A;   TEE WITHOUT CARDIOVERSION N/A 03/21/2012   Procedure: TRANSESOPHAGEAL ECHOCARDIOGRAM (TEE);  Surgeon: Ricki Rodriguez, MD;  Location: [facility] ENDOSCOPY;  Service: Cardiovascular;  Laterality: N/A;   TEE WITHOUT CARDIOVERSION N/A 04/24/2013   Procedure: TRANSESOPHAGEAL ECHOCARDIOGRAM (TEE);  Surgeon: Lars Masson, MD;  Location: [facility] ENDOSCOPY;  Service: Cardiovascular;  Laterality: N/A;   TEE WITHOUT CARDIOVERSION N/A 07/29/2015   Procedure: TRANSESOPHAGEAL ECHOCARDIOGRAM (TEE);  Surgeon: Pricilla Riffle, MD;  Location: [facility] ENDOSCOPY;  Service:  Cardiovascular;  Laterality: N/A;   TEE WITHOUT CARDIOVERSION N/A 05/11/2022   Procedure: TRANSESOPHAGEAL ECHOCARDIOGRAM;  Surgeon: Christell Constant, MD;  [facility];  Service: Cardiovascular;  Laterality: N/A;   TONSILLECTOMY     TOTAL KNEE ARTHROPLASTY Right 05/24/2018   Procedure: RIGHT TOTAL KNEE ARTHROPLASTY;  Surgeon: Durene Romans, MD;  Location: [facility]S;  Service: Orthopedics;  Laterality: Right;  70 mins   Family History  Problem Relation Age of Onset   Cancer Father        bone   Heart failure Mother    Hypertension Mother    Asthma Mother    Cancer Paternal Grandmother        ovarian   CVA Other        Fam Hx of multiple myeloma   Diabetes Other        Fam Hx of DM   Social History   Socioeconomic History   Marital status: Married    Spouse name: Not on file   Number of children: 2   Years of education: college   Highest education level: Bachelor's degree (e.g., BA, AB, BS)  Occupational History   Occupation: RETIRED  Tobacco Use   Smoking status: Former    Current packs/day: 0.00    Average packs/day: 0.5 packs/day for 5.0 years (2.5 ttl pk-yrs)    Types: Cigarettes    Start date: 36    Quit date: 1976    Years since quitting: 49.2   Smokeless tobacco: Never   Tobacco comments:    Former smoker 03/27/22  Vaping Use   Vaping status: Never Used  Substance and Sexual Activity   Alcohol use: No    Comment: Former EtOH abuse, stopped 06/2016   Drug use: Yes    Types: Marijuana    Comment: negative hx for IV drug abuse   Sexual activity: Not on file  Other Topics Concern   Not on file  Social History Narrative   Lives with wife in [place]   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   Prior Hotel manager   Wife lives in town (still married but lives separate)   Has biological daughter in [place] ([place])   Social Drivers of Health   Financial Resource Strain: Low Risk  (01/14/2023)   Overall Financial Resource Strain (CARDIA)    Difficulty  of Paying Living  Expenses: Not hard at all  Food Insecurity: No Food Insecurity (01/14/2023)   Hunger Vital Sign    Worried About Running Out of Food in the Last Year: Never true    Ran Out of Food in the Last Year: Never true  Transportation Needs: No Transportation Needs (01/14/2023)   PRAPARE - Administrator, Civil Service (Medical): No    Lack of Transportation (Non-Medical): No  Physical Activity: Insufficiently Active (01/14/2023)   Exercise Vital Sign    Days of Exercise per Week: 2 days    Minutes of Exercise per Session: 50 min  Stress: Stress Concern Present (01/14/2023)   [employer] of Occupational Health - Occupational Stress Questionnaire    Feeling of Stress : Rather much  Social Connections: Socially Isolated (01/14/2023)   Social Connection and Isolation Panel [NHANES]    Frequency of Communication with Friends and Family: Never    Frequency of Social Gatherings with Friends and Family: Never    Attends Religious Services: Never    Database administrator or Organizations: No    Attends Engineer, structural: Not on file    Marital Status: Separated    Tobacco Counseling Counseling given: Not Answered Tobacco comments: Former smoker 03/27/22    Clinical Intake:  Pre-visit preparation completed: Yes  Pain : No/denies pain Pain Score: 8  Pain Type: Acute pain Pain Location: Abdomen     BMI - recorded: 33.81 Nutritional Status: BMI > 30  Obese Nutritional Risks: Nausea/ vomitting/ diarrhea Diabetes: No  Lab Results  Component Value Date   HGBA1C 5.8 01/18/2023   HGBA1C 5.7 07/09/2021   HGBA1C 5.4 03/09/2018     How often do you need to have someone help you when you read instructions, pamphlets, or other written materials from your doctor or pharmacy?: 1 - Never  Interpreter Needed?: No  Information entered by :: Brandon [name], RMA   Activities of Daily Living     04/02/2023    9:53 AM 07/13/2022    6:51 AM  In your present state of health,  do you have any difficulty performing the following activities:  Hearing? 1 0  Comment Per pt- lt ear has some issues   Vision? 0 0  Difficulty concentrating or making decisions? 0 0  Walking or climbing stairs? 0 1  Dressing or bathing? 0 0  Doing errands, shopping? 0   Preparing Food and eating ? N   Using the Toilet? N   In the past six months, have you accidently leaked urine? N   Do you have problems with loss of bowel control? N   Managing your Medications? N   Managing your Finances? N   Housekeeping or managing your Housekeeping? N     Patient Care Team: Etta Grandchild, MD as PCP - General (Internal Medicine) Marinus Maw, MD as PCP - Cardiology (Cardiology) Mealor, Roberts Gaudy, MD as PCP - Electrophysiology (Cardiology) Purcell Nails, MD (Inactive) as Consulting Physician (Cardiothoracic Surgery) Lars Masson, MD as Consulting Physician (Cardiology)  Indicate any recent Medical Services you may have received from other than [facility] providers in the past year (date may be approximate).     Assessment:   This is a routine wellness examination for Brandon [name].  Hearing/Vision screen Hearing Screening - Comments:: Per pt- lt ear has some issues Vision Screening - Comments:: Wears eyeglasses   Goals Addressed   None    Depression Screen  02/15/2023   10:35 AM 12/15/2022    9:48 AM 11/12/2022    9:47 AM 10/29/2022   10:21 AM 09/22/2022   10:43 AM 06/09/2022   11:33 AM 02/12/2022    2:21 PM  PHQ 2/9 Scores  PHQ - 2 Score 0 0 2 1 2  0 2  PHQ- 9 Score    2 14 0 11    Fall Risk     04/02/2023    9:55 AM 02/15/2023   10:35 AM 12/15/2022    9:48 AM 11/12/2022    9:47 AM 10/29/2022   10:21 AM  Fall Risk   Falls in the past year? 0 0 0 0 0  Number falls in past yr: 0 0  0 0  Injury with Fall? 0 0   0  Risk for fall due to : No Fall Risks      Follow up Falls prevention discussed;Falls evaluation completed        MEDICARE RISK AT HOME:     TIMED UP AND  GO:  Was the test performed?  Yes  Length of time to ambulate 10 feet: 35 sec Gait slow and steady with assistive device  Cognitive Function: 6CIT completed    12/10/2021   11:08 AM  MMSE - Mini Mental State Exam  Not completed: Refused        Immunizations Immunization History  Administered Date(s) Administered   Fluad Quad(high Dose 65+) 09/21/2018   Influenza Whole 10/17/2007   Influenza, High Dose Seasonal PF 10/09/2014   Influenza,inj,Quad PF,6+ Mos 10/21/2016, 03/09/2018   Influenza,inj,quad, With Preservative 12/12/2013, 12/05/2016   Influenza-Unspecified 10/14/2020, 09/05/2021, 09/29/2022   Moderna Covid-19 Fall Seasonal Vaccine 89yrs & older 10/27/2021   PFIZER Comirnaty(Gray Top)Covid-19 Tri-Sucrose Vaccine 04/24/2020   PFIZER(Purple Top)SARS-COV-2 Vaccination 02/19/2019, 03/14/2019, 10/10/2019   Pfizer Covid-19 Vaccine Bivalent Booster 71yrs & up 09/12/2020   Pneumococcal Conjugate-13 03/11/2016, 10/21/2016   Pneumococcal Polysaccharide-23 10/09/2014, 09/12/2020   Pneumococcal-Unspecified 12/05/2016   Tdap 01/26/2010, 01/27/2012    Screening Tests Health Maintenance  Topic Date Due   Zoster Vaccines- Shingrix (1 of 2) Never done   DTaP/Tdap/Td (3 - Td or Tdap) 01/26/2022   COVID-19 Vaccine (7 - 2024-25 season) 09/06/2022   Medicare Annual Wellness (AWV)  12/11/2022   Colonoscopy  12/02/2026   Pneumonia Vaccine 11+ Years old  Completed   INFLUENZA VACCINE  Completed   Hepatitis C Screening  Completed   HPV VACCINES  Aged Out    Health Maintenance  Health Maintenance Due  Topic Date Due   Zoster Vaccines- Shingrix (1 of 2) Never done   DTaP/Tdap/Td (3 - Td or Tdap) 01/26/2022   COVID-19 Vaccine (7 - 2024-25 season) 09/06/2022   Medicare Annual Wellness (AWV)  12/11/2022   Health Maintenance Items Addressed: See Nurse Notes  Additional Screening:  Vision Screening: Recommended annual ophthalmology exams for early detection of glaucoma and other  disorders of the eye.  Dental Screening: Recommended annual dental exams for proper oral hygiene  Community Resource Referral / Chronic Care Management: CRR required this visit?  No   CCM required this visit?  No     Plan:     I have personally reviewed and noted the following in the patient's chart:   Medical and social history Use of alcohol, tobacco or illicit drugs  Current medications and supplements including opioid prescriptions. Patient is not currently taking opioid prescriptions. Functional ability and status Nutritional status Physical activity Advanced directives List of other physicians Hospitalizations, surgeries, and  ER visits in previous 12 months Vitals Screenings to include cognitive, depression, and falls Referrals and appointments  In addition, I have reviewed and discussed with patient certain preventive protocols, quality metrics, and best practice recommendations. A written personalized care plan for preventive services as well as general preventive health recommendations were provided to patient.     Arminta Gamm L Kaedyn Belardo, CMA   04/02/2023   After Visit Summary: (MyChart) Due to this being a telephonic visit, the after visit summary with patients personalized plan was offered to patient via MyChart   Notes: Please refer to Routing Comments.

## 2023-04-05 NOTE — Telephone Encounter (Signed)
 Left message for patient today regarding info.

## 2023-04-06 ENCOUNTER — Telehealth: Admitting: Physician Assistant

## 2023-04-06 DIAGNOSIS — I10 Essential (primary) hypertension: Secondary | ICD-10-CM | POA: Diagnosis not present

## 2023-04-06 DIAGNOSIS — R1084 Generalized abdominal pain: Secondary | ICD-10-CM | POA: Diagnosis not present

## 2023-04-06 DIAGNOSIS — R112 Nausea with vomiting, unspecified: Secondary | ICD-10-CM | POA: Diagnosis not present

## 2023-04-06 DIAGNOSIS — R110 Nausea: Secondary | ICD-10-CM | POA: Diagnosis not present

## 2023-04-06 DIAGNOSIS — A084 Viral intestinal infection, unspecified: Secondary | ICD-10-CM

## 2023-04-06 DIAGNOSIS — R1111 Vomiting without nausea: Secondary | ICD-10-CM | POA: Diagnosis not present

## 2023-04-07 MED ORDER — ONDANSETRON 4 MG PO TBDP: 4.0000 mg | ORAL_TABLET | Freq: Three times a day (TID) | ORAL | 0 refills | Status: DC | PRN

## 2023-04-07 NOTE — Progress Notes (Signed)
 E-Visit for Vomiting  We are sorry that you are not feeling well. Here is how we plan to help!  Based on what you have shared with me it looks like you have a Virus that is irritating your GI tract.  Vomiting is the forceful emptying of a portion of the stomach's content through the mouth.  Although nausea and vomiting can make you feel miserable, it's important to remember that these are not diseases, but rather symptoms of an underlying illness.  When we treat short term symptoms, we always caution that any symptoms that persist should be fully evaluated in a medical office.  I have prescribed a medication that will help alleviate your symptoms and allow you to stay hydrated:  Zofran 4 mg 1 tablet every 8 hours as needed for nausea and vomiting  HOME CARE: Drink clear liquids.  This is very important! Dehydration (the lack of fluid) can lead to a serious complication.  Start off with 1 tablespoon every 5 minutes for 8 hours. You may begin eating bland foods after 8 hours without vomiting.  Start with saltine crackers, white bread, rice, mashed potatoes, applesauce. After 48 hours on a bland diet, you may resume a normal diet. Try to go to sleep.  Sleep often empties the stomach and relieves the need to vomit.  GET HELP RIGHT AWAY IF:  Your symptoms do not improve or worsen within 2 days after treatment. You have a fever for over 3 days. You cannot keep down fluids after trying the medication.  MAKE SURE YOU:  Understand these instructions. Will watch your condition. Will get help right away if you are not doing well or get worse.   Thank you for choosing an e-visit.  Your e-visit answers were reviewed by a board certified advanced clinical practitioner to complete your personal care plan. Depending upon the condition, your plan could have included both over the counter or prescription medications.  Please review your pharmacy choice. Make sure the pharmacy is open so you can pick  up prescription now. If there is a problem, you may contact your provider through [employer] and have the prescription routed to another pharmacy.  Your safety is important to [place]. If you have drug allergies check your prescription carefully.   For the next 24 hours you can use MyChart to ask questions about today's visit, request a non-urgent call back, or ask for a work or school excuse. You will get an email in the next two days asking about your experience. I hope that your e-visit has been valuable and will speed your recovery.

## 2023-04-07 NOTE — Progress Notes (Signed)
 I have spent 5 minutes in review of e-visit questionnaire, review and updating patient chart, medical decision making and response to patient.   Piedad Climes, PA-C

## 2023-04-12 NOTE — Progress Notes (Cosign Needed Addendum)
 Subjective:   Brandon [name]. is a 73 y.o. who presents for a Medicare Wellness preventive visit.  Visit Complete: In person  Persons Participating in Visit: Patient.  AWV Questionnaire: No: Patient Medicare AWV questionnaire was not completed prior to this visit.  Cardiac Risk Factors include: advanced age (>27men, >53 women);Other (see comment);male gender;hypertension;dyslipidemia, Risk factor comments: Persistent atrial fibrillation, SVT, BPH     Objective:    Today's Vitals   04/02/23 0943 04/02/23 0944  BP: 90/60   SpO2: 93%   Weight: 277 lb 12.8 oz (126 kg)   Height: 6\' 4"  (1.93 m)   PainSc:  8    Body mass index is 33.81 kg/m.     04/02/2023    9:49 AM 03/30/2023    3:32 PM 11/15/2022    9:16 PM 10/20/2022    2:15 PM 07/13/2022    6:57 AM 05/11/2022   10:07 AM 02/27/2022    9:06 AM  Advanced Directives  Does Patient Have a Medical Advance Directive? No No No No No No No  Does patient want to make changes to medical advance directive?    No - Patient declined     Would patient like information on creating a medical advance directive?    No - Patient declined   No - Patient declined    Current Medications (verified) Outpatient Encounter Medications as of 04/02/2023  Medication Sig   amLODipine (NORVASC) 5 MG tablet Take 1 tablet (5 mg total) by mouth daily.   apixaban (ELIQUIS) 5 MG TABS tablet Take 1 tablet (5 mg total) by mouth 2 (two) times daily.   atorvastatin (LIPITOR) 40 MG tablet TAKE 1 TABLET BY MOUTH EVERY DAY   carvedilol (COREG) 25 MG tablet Take 1 tablet (25 mg total) by mouth 2 (two) times daily with a meal.   dutasteride (AVODART) 0.5 MG capsule Take 0.5 mg by mouth daily.   escitalopram (LEXAPRO) 20 MG tablet Take 1 tablet (20 mg total) by mouth daily.   flecainide (TAMBOCOR) 50 MG tablet TAKE 1 TABLET BY MOUTH TWICE A DAY   fluticasone (FLONASE) 50 MCG/ACT nasal spray SPRAY 2 SPRAYS INTO EACH NOSTRIL EVERY DAY   KLOR-CON M20 20 MEQ tablet TAKE 1  TABLET BY MOUTH EVERY DAY   memantine (NAMENDA) 10 MG tablet TAKE ONE TABLET BY MOUTH TWICE A DAY   spironolactone (ALDACTONE) 25 MG tablet TAKE ONE TABLET BY MOUTH DAILY   tamsulosin (FLOMAX) 0.4 MG CAPS capsule Take 1 capsule (0.4 mg total) by mouth daily.   triamcinolone cream (KENALOG) 0.1 % Apply 1 Application topically 2 (two) times daily as needed (rash).   [DISCONTINUED] gabapentin (NEURONTIN) 800 MG tablet Take 1 tablet (800 mg total) by mouth at bedtime. (Patient taking differently: Take 800 mg by mouth 3 (three) times daily.)   No facility-administered encounter medications on file as of 04/02/2023.    Allergies (verified) Ace inhibitors and Albuterol   History: Past Medical History:  Diagnosis Date   Anxiety    Arthritis    Benign neoplasm of colon    Benign prostatic hyperplasia with urinary obstruction    Bilateral inguinal hernia 05/29/2011   Bilateral pulmonary embolism (HCC) 3/14   admitted to [facility],  treated with Xarelto   Breast lump 03/29/2019   Calculus of kidney 10/21/2016   Cataract    Chronic bilateral low back pain with sciatica 03/21/2012   Chronic pain    Finger mass, right 10/17/2020   History of alcohol  abuse 03/07/2016   History of colon polyps 11/18/2015   History of kidney stones    History of pulmonary embolism 03/19/2012   Hyperlipemia    Hypertension    Insomnia    Insomnia due to medical condition 12/23/2016   Polyuria secondary to BPH   Major depressive disorder, recurrent episode (HCC)    Mitral regurgitation 04/24/2013   Mild by TEE   Morbid obesity (HCC) 03/07/2016   Nonrheumatic mitral valve insufficiency 04/25/2013   Mild by TEE  Overview:  Overview:  Mild by TEE Overview:  Overview:  Overview:  Mild by TEE Overview:  Overview:  Mild by TEE   Obesity    OSA (obstructive sleep apnea)    noncompliant with CPAP.  07/27/13- awaiting a CPAP- unable to tolerate mask   Osteoporosis    Persistent atrial fibrillation (HCC)    Recurrent  unilateral inguinal hernia 07/17/2013   Restless leg syndrome    takes gabapentin   S/P right total knee arthroplasty 05/24/2018   Sciatica    Spinal stenosis, lumbar region with neurogenic claudication    SVT (supraventricular tachycardia) (HCC) 03/07/2016   Thrombus of left atrial appendage 03/09/2013   Uncomplicated asthma 06/20/2008   Overview:  Overview:  Overview:  Overview:  Qualifier: Diagnosis of  By: Tawanna Cooler MD, Eugenio Hoes Overview:  Overview:  Qualifier: Diagnosis of  By: Tawanna Cooler MD, Eugenio Hoes   Ventral hernia, unspecified, without mention of obstruction or gangrene    right abdominal wall   Past Surgical History:  Procedure Laterality Date   ATRIAL FIBRILLATION ABLATION N/A 02/27/2022   Procedure: ATRIAL FIBRILLATION ABLATION;  Surgeon: Maurice Small, MD;  Location: [facility] INVASIVE CV LAB;  Service: Cardiovascular;  Laterality: N/A;   CARDIOVERSION N/A 03/21/2012   Procedure: CARDIOVERSION;  Surgeon: Ricki Rodriguez, MD;  Location: [facility] ENDOSCOPY;  Service: Cardiovascular;  Laterality: N/A;   CARDIOVERSION N/A 04/24/2013   Procedure: CARDIOVERSION;  Surgeon: Lars Masson, MD;  Location: [facility] ENDOSCOPY;  Service: Cardiovascular;  Laterality: N/A;   CARDIOVERSION N/A 06/27/2015   Procedure: CARDIOVERSION;  Surgeon: Laurey Morale, MD;  Location[facility] ENDOSCOPY;  Service: Cardiovascular;  Laterality: N/A;   CARDIOVERSION N/A 11/11/2021   Procedure: CARDIOVERSION;  Surgeon: Lewayne Bunting, MD;  Location: [facility] ENDOSCOPY;  Service: Cardiovascular;  Laterality: N/A;   CARDIOVERSION N/A 05/11/2022   Procedure: CARDIOVERSION;  Surgeon: Christell Constant, MD;  Location: [facility] INV[facility] CV LAB;  Service: Cardiovascular;  Laterality: N/A;   CARDIOVERSION N/A 07/13/2022   Procedure: CARDIOVERSION;  Surgeon: Thurmon Fair, MD;  Location: [facility] IN[facility];  Service: Cardiovascular;  Laterality: N/A;   CATARACT EXTRACTION     COLONOSCOPY W/ POLYPECTOMY     ELECTROPHYSIOLOGIC STUDY N/A 07/30/2015    Procedure: Atrial Fibrillation Ablation;  Surgeon: Hillis Range, MD;  Location: [facility] INVASIVE CV LAB;  Service: Cardiovascular;  Laterality: N/A;   EXTRACORPOREAL SHOCK WAVE LITHOTRIPSY Right 10/29/2016   Procedure: RIGHT EXTRACORPOREAL SHOCK WAVE LITHOTRIPSY (ESWL);  Surgeon: Sebastian Ache, MD;  Location: [facility] ORS;  Service: Urology;  Laterality: Right;   EYE SURGERY Right    cataract   FEMUR FRACTURE SURGERY     HERNIA REPAIR  07/06/2011   INGUINAL HERNIA REPAIR Right 07/28/2013   Procedure: RIGHT INGUINAL HERNIA REPAIR;  Surgeon: Wilmon Arms. Corliss Skains, MD;  Location: [facility] OR;  Service: General;  Laterality: Right;   INGUINAL HERNIA REPAIR Right 09/22/2016   Procedure: LAPAROSCOPIC RIGHT INGUINAL HERNIA;  Surgeon: Sheliah Hatch De Blanch, MD;  Location: [facility] ORS;  Service: General;  Laterality: Right;  With MESH   INSERTION OF MESH Right 07/28/2013   Procedure: INSERTION OF MESH;  Surgeon: Wilmon Arms. Corliss Skains, MD;  Location: [facility] OR;  Service: General;  Laterality: Right;   KNEE ARTHROSCOPY     left   REPLACEMENT TOTAL KNEE Left    ROTATOR CUFF REPAIR     right   ROTATOR CUFF REPAIR Left 03/08/2014   DR SUPPLE   SHOULDER ARTHROSCOPY WITH ROTATOR CUFF REPAIR AND SUBACROMIAL DECOMPRESSION Left 03/08/2014   Procedure: LEFT SHOULDER ARTHROSCOPY WITH ROTATOR CUFF REPAIR/SUBACROMIAL DECOMPRESSION/DISTAL CLAVICLE RESECTION;  Surgeon: Senaida Lange, MD;  Location: [facility] OR;  Service: Orthopedics;  Laterality: Left;   SVT ABLATION N/A 01/23/2019   Procedure: SVT ABLATION;  Surgeon: Marinus Maw, MD;  Location: [facility] INVASIVE CV LAB;  Service: Cardiovascular;  Laterality: N/A;   TEE WITHOUT CARDIOVERSION N/A 03/21/2012   Procedure: TRANSESOPHAGEAL ECHOCARDIOGRAM (TEE);  Surgeon: Ricki Rodriguez, MD;  Location: [facility] ENDOSCOPY;  Service: Cardiovascular;  Laterality: N/A;   TEE WITHOUT CARDIOVERSION N/A 04/24/2013   Procedure: TRANSESOPHAGEAL ECHOCARDIOGRAM (TEE);  Surgeon: Lars Masson, MD;  Location: [facility] ENDOSCOPY;  Service:  Cardiovascular;  Laterality: N/A;   TEE WITHOUT CARDIOVERSION N/A 07/29/2015   Procedure: TRANSESOPHAGEAL ECHOCARDIOGRAM (TEE);  Surgeon: Pricilla Riffle, MD;  Location: [facility] ENDOSCOPY;  Service: Cardiovascular;  Laterality: N/A;   TEE WITHOUT CARDIOVERSION N/A 05/11/2022   Procedure: TRANSESOPHAGEAL ECHOCARDIOGRAM;  Surgeon: Christell Constant, MD;  [facility]: [facility]AS[facility];  Service: Cardiovascular;  Laterality: N/A;   TONSILLECTOMY     TOTAL KNEE ARTHROPLASTY Right 05/24/2018   Procedure: RIGHT TOTAL KNEE ARTHROPLASTY;  Surgeon: Durene Romans, MD;  Location: [facility] ORS;  Service: Orthopedics;  Laterality: Right;  70 mins   Family History  Problem Relation Age of Onset   Cancer Father        bone   Heart failure Mother    Hypertension Mother    Asthma Mother    Cancer Paternal Grandmother        ovarian   CVA Other        Fam Hx of multiple myeloma   Diabetes Other        Fam Hx of DM   Social History   Socioeconomic History   Marital status: Married    Spouse name: Not on file   Number of children: 2   Years of education: college   Highest education level: Bachelor's degree (e.g., BA, AB, BS)  Occupational History   Occupation: RETIRED  Tobacco Use   Smoking status: Former    Current packs/day: 0.00    Average packs/day: 0.5 packs/day for 5.0 years (2.5 ttl pk-yrs)    Types: Cigarettes    Start date: 25    Quit date: 1976    Years since quitting: 49.2   Smokeless tobacco: Never   Tobacco comments:    Former smoker 03/27/22  Vaping Use   Vaping status: Never Used  Substance and Sexual Activity   Alcohol use: No    Comment: Former EtOH abuse, stopped 06/2016   Drug use: Yes    Types: Marijuana    Comment: negative hx for IV drug abuse   Sexual activity: Not on file  Other Topics Concern   Not on file  Social History Narrative   Lives with wife in [place]   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   Prior Hotel manager   Wife lives in town (still married but lives separate)   Has  biological daughter  in [place] (Corrie Dandy Domanski)   Social Drivers of Health   Financial Resource Strain: Low Risk  (04/02/2023)   Overall Financial Resource Strain (CARDIA)    Difficulty of Paying Living Expenses: Not hard at all  Food Insecurity: No Food Insecurity (04/02/2023)   Hunger Vital Sign    Worried About Running Out of Food in the Last Year: Never true    Ran Out of Food in the Last Year: Never true  Transportation Needs: No Transportation Needs (04/02/2023)   PRAPARE - Administrator, Civil Service (Medical): No    Lack of Transportation (Non-Medical): No  Physical Activity: Inactive (04/02/2023)   Exercise Vital Sign    Days of Exercise per Week: 0 days    Minutes of Exercise per Session: 0 min  Stress: No Stress Concern Present (04/02/2023)   [employer] of Occupational Health - Occupational Stress Questionnaire    Feeling of Stress : Not at all  Recent Concern: Stress - Stress Concern Present (01/14/2023)   [employer] of Occupational Health - Occupational Stress Questionnaire    Feeling of Stress : Rather much  Social Connections: Socially Isolated (04/02/2023)   Social Connection and Isolation Panel [NHANES]    Frequency of Communication with Friends and Family: Never    Frequency of Social Gatherings with Friends and Family: Never    Attends Religious Services: Never    Database administrator or Organizations: No    Attends Banker Meetings: Never    Marital Status: Separated    Tobacco Counseling Counseling given: Not Answered Tobacco comments: Former smoker 03/27/22    Clinical Intake:  Pre-visit preparation completed: Yes  Pain : No/denies pain Pain Score: 8  Pain Type: Acute pain Pain Location: Abdomen     BMI - recorded: 33.81 Nutritional Status: BMI > 30  Obese Nutritional Risks: Nausea/ vomitting/ diarrhea Diabetes: No  Lab Results  Component Value Date   HGBA1C 5.8 01/18/2023   HGBA1C 5.7 07/09/2021   HGBA1C 5.4  03/09/2018     How often do you need to have someone help you when you read instructions, pamphlets, or other written materials from your doctor or pharmacy?: 1 - Never  Interpreter Needed?: No  Information entered by :: Nialah Saravia, RMA   Activities of Daily Living     04/02/2023    9:53 AM 07/13/2022    6:51 AM  In your present state of health, do you have any difficulty performing the following activities:  Hearing? 1 0  Comment Per pt- lt ear has some issues   Vision? 0 0  Difficulty concentrating or making decisions? 0 0  Walking or climbing stairs? 0 1  Dressing or bathing? 0 0  Doing errands, shopping? 0   Preparing Food and eating ? N   Using the Toilet? N   In the past six months, have you accidently leaked urine? N   Do you have problems with loss of bowel control? N   Managing your Medications? N   Managing your Finances? N   Housekeeping or managing your Housekeeping? N     Patient Care Team: Etta Grandchild, MD as PCP - General (Internal Medicine) Marinus Maw, MD as PCP - Cardiology (Cardiology) Mealor, Roberts Gaudy, MD as PCP - Electrophysiology (Cardiology) Purcell Nails, MD (Inactive) as Consulting Physician (Cardiothoracic Surgery) Lars Masson, MD as Consulting Physician (Cardiology)  Indicate any recent Medical Services you may have received from other than [facility] providers in  the past year (date may be approximate).     Assessment:   This is a routine wellness examination for Naod.  Hearing/Vision screen Hearing Screening - Comments:: Per pt- lt ear has some issues Vision Screening - Comments:: Wears eyeglasses and contact lenses   Goals Addressed   None    Depression Screen     04/02/2023    9:57 AM 02/15/2023   10:35 AM 12/15/2022    9:48 AM 11/12/2022    9:47 AM 10/29/2022   10:21 AM 09/22/2022   10:43 AM 06/09/2022   11:33 AM  PHQ 2/9 Scores  PHQ - 2 Score 6 0 0 2 1 2  0  PHQ- 9 Score 7    2 14  0    Fall Risk     04/02/2023     9:55 AM 02/15/2023   10:35 AM 12/15/2022    9:48 AM 11/12/2022    9:47 AM 10/29/2022   10:21 AM  Fall Risk   Falls in the past year? 0 0 0 0 0  Number falls in past yr: 0 0  0 0  Injury with Fall? 0 0   0  Risk for fall due to : No Fall Risks      Follow up Falls prevention discussed;Falls evaluation completed        MEDICARE RISK AT HOME:  Medicare Risk at Home Any stairs in or around the home?: No Home free of loose throw rugs in walkways, pet beds, electrical cords, etc?: Yes Adequate lighting in your home to reduce risk of falls?: Yes Life alert?: No Use of a cane, walker or w/c?: Yes Grab bars in the bathroom?: No Shower chair or bench in shower?: Yes Elevated toilet seat or a handicapped toilet?: Yes  TIMED UP AND GO:  Was the test performed?  Yes  Length of time to ambulate 10 feet: 35 sec Gait slow and steady with assistive device  Cognitive Function: Normal: Normal cognitive status assessed by direct observation by this Clinical Health Advisor. No abnormalities found. Patient is able to answer questions in an accurate and timely manner.    12/10/2021   11:08 AM  MMSE - Mini Mental State Exam  Not completed: Refused        Immunizations Immunization History  Administered Date(s) Administered   Fluad Quad(high Dose 65+) 09/21/2018   Influenza Whole 10/17/2007   Influenza, High Dose Seasonal PF 10/09/2014   Influenza,inj,Quad PF,6+ Mos 10/21/2016, 03/09/2018   Influenza,inj,quad, With Preservative 12/12/2013, 12/05/2016   Influenza-Unspecified 10/14/2020, 09/05/2021, 09/29/2022   Moderna Covid-19 Fall Seasonal Vaccine 27yrs & older 10/27/2021   PFIZER Comirnaty(Gray Top)Covid-19 Tri-Sucrose Vaccine 04/24/2020   PFIZER(Purple Top)SARS-COV-2 Vaccination 02/19/2019, 03/14/2019, 10/10/2019   Pfizer Covid-19 Vaccine Bivalent Booster 66yrs & up 09/12/2020   Pneumococcal Conjugate-13 03/11/2016, 10/21/2016   Pneumococcal Polysaccharide-23 10/09/2014, 09/12/2020    Pneumococcal-Unspecified 12/05/2016   Tdap 01/26/2010, 01/27/2012    Screening Tests Health Maintenance  Topic Date Due   Zoster Vaccines- Shingrix (1 of 2) Never done   DTaP/Tdap/Td (3 - Td or Tdap) 01/26/2022   COVID-19 Vaccine (7 - 2024-25 season) 09/06/2022   INFLUENZA VACCINE  08/06/2023   Medicare Annual Wellness (AWV)  04/01/2024   Colonoscopy  12/02/2026   Pneumonia Vaccine 31+ Years old  Completed   Hepatitis C Screening  Completed   HPV VACCINES  Aged Out    Health Maintenance  Health Maintenance Due  Topic Date Due   Zoster Vaccines- Shingrix (1 of 2) Never done  DTaP/Tdap/Td (3 - Td or Tdap) 01/26/2022   COVID-19 Vaccine (7 - 2024-25 season) 09/06/2022   Health Maintenance Items Addressed: See Nurse Notes  Additional Screening:  Vision Screening: Recommended annual ophthalmology exams for early detection of glaucoma and other disorders of the eye.  Dental Screening: Recommended annual dental exams for proper oral hygiene  Community Resource Referral / Chronic Care Management: CRR required this visit?  No   CCM required this visit?  No     Plan:     I have personally reviewed and noted the following in the patient's chart:   Medical and social history Use of alcohol, tobacco or illicit drugs  Current medications and supplements including opioid prescriptions. Patient is not currently taking opioid prescriptions. Functional ability and status Nutritional status Physical activity Advanced directives List of other physicians Hospitalizations, surgeries, and ER visits in previous 12 months Vitals Screenings to include cognitive, depression, and falls Referrals and appointments  In addition, I have reviewed and discussed with patient certain preventive protocols, quality metrics, and best practice recommendations. A written personalized care plan for preventive services as well as general preventive health recommendations were provided to  patient.     Raunel Dimartino L Quilla Freeze, CMA   04/02/2023  After Visit Summary: (MyChart) Due to this being a telephonic visit, the after visit summary with patients personalized plan was offered to patient via MyChart   Notes: Please refer to Routing Comments.

## 2023-04-16 ENCOUNTER — Emergency Department (HOSPITAL_COMMUNITY)
Admission: EM | Admit: 2023-04-16 | Discharge: 2023-04-16 | Disposition: A | Discharge status: Home / Self Care | Attending: Emergency Medicine | Admitting: Emergency Medicine

## 2023-04-16 ENCOUNTER — Other Ambulatory Visit: Payer: Self-pay

## 2023-04-16 ENCOUNTER — Emergency Department (HOSPITAL_COMMUNITY)

## 2023-04-16 DIAGNOSIS — Z79899 Other long term (current) drug therapy: Secondary | ICD-10-CM | POA: Insufficient documentation

## 2023-04-16 DIAGNOSIS — R1084 Generalized abdominal pain: Secondary | ICD-10-CM | POA: Diagnosis not present

## 2023-04-16 DIAGNOSIS — R000 Tachycardia, unspecified: Secondary | ICD-10-CM | POA: Diagnosis not present

## 2023-04-16 DIAGNOSIS — K5750 Diverticulosis of both small and large intestine without perforation or abscess without bleeding: Secondary | ICD-10-CM | POA: Diagnosis not present

## 2023-04-16 DIAGNOSIS — Z7901 Long term (current) use of anticoagulants: Secondary | ICD-10-CM | POA: Insufficient documentation

## 2023-04-16 DIAGNOSIS — R509 Fever, unspecified: Secondary | ICD-10-CM | POA: Diagnosis not present

## 2023-04-16 DIAGNOSIS — K4020 Bilateral inguinal hernia, without obstruction or gangrene, not specified as recurrent: Secondary | ICD-10-CM | POA: Diagnosis not present

## 2023-04-16 DIAGNOSIS — R932 Abnormal findings on diagnostic imaging of liver and biliary tract: Secondary | ICD-10-CM | POA: Diagnosis not present

## 2023-04-16 DIAGNOSIS — E876 Hypokalemia: Secondary | ICD-10-CM | POA: Diagnosis not present

## 2023-04-16 DIAGNOSIS — R1111 Vomiting without nausea: Secondary | ICD-10-CM | POA: Diagnosis not present

## 2023-04-16 DIAGNOSIS — I10 Essential (primary) hypertension: Secondary | ICD-10-CM | POA: Diagnosis not present

## 2023-04-16 DIAGNOSIS — R112 Nausea with vomiting, unspecified: Secondary | ICD-10-CM | POA: Diagnosis not present

## 2023-04-16 DIAGNOSIS — R9431 Abnormal electrocardiogram [ECG] [EKG]: Secondary | ICD-10-CM | POA: Diagnosis not present

## 2023-04-16 DIAGNOSIS — F1290 Cannabis use, unspecified, uncomplicated: Secondary | ICD-10-CM

## 2023-04-16 DIAGNOSIS — N200 Calculus of kidney: Secondary | ICD-10-CM | POA: Diagnosis not present

## 2023-04-16 LAB — CBC WITH DIFFERENTIAL/PLATELET
Abs Immature Granulocytes: 0.03 10*3/uL (ref 0.00–0.07)
Basophils Absolute: 0 10*3/uL (ref 0.0–0.1)
Basophils Relative: 1 %
Eosinophils Absolute: 0.1 10*3/uL (ref 0.0–0.5)
Eosinophils Relative: 2 %
HCT: 45.5 % (ref 39.0–52.0)
Hemoglobin: 15.5 g/dL (ref 13.0–17.0)
Immature Granulocytes: 0 %
Lymphocytes Relative: 20 %
Lymphs Abs: 1.3 10*3/uL (ref 0.7–4.0)
MCH: 30.2 pg (ref 26.0–34.0)
MCHC: 34.1 g/dL (ref 30.0–36.0)
MCV: 88.5 fL (ref 80.0–100.0)
Monocytes Absolute: 0.7 10*3/uL (ref 0.1–1.0)
Monocytes Relative: 10 %
Neutro Abs: 4.5 10*3/uL (ref 1.7–7.7)
Neutrophils Relative %: 67 %
Platelets: 166 10*3/uL (ref 150–400)
RBC: 5.14 MIL/uL (ref 4.22–5.81)
RDW: 13.4 % (ref 11.5–15.5)
WBC: 6.7 10*3/uL (ref 4.0–10.5)
nRBC: 0 % (ref 0.0–0.2)

## 2023-04-16 LAB — URINALYSIS, W/ REFLEX TO CULTURE (INFECTION SUSPECTED)
Bilirubin Urine: NEGATIVE
Glucose, UA: NEGATIVE mg/dL
Hgb urine dipstick: NEGATIVE
Ketones, ur: 20 mg/dL — AB
Nitrite: NEGATIVE
Protein, ur: 30 mg/dL — AB
Specific Gravity, Urine: 1.027 (ref 1.005–1.030)
pH: 8 (ref 5.0–8.0)

## 2023-04-16 LAB — COMPREHENSIVE METABOLIC PANEL WITH GFR
ALT: 10 U/L (ref 0–44)
AST: 16 U/L (ref 15–41)
Albumin: 3.7 g/dL (ref 3.5–5.0)
Alkaline Phosphatase: 56 U/L (ref 38–126)
Anion gap: 11 (ref 5–15)
BUN: 11 mg/dL (ref 8–23)
CO2: 21 mmol/L — ABNORMAL LOW (ref 22–32)
Calcium: 8.6 mg/dL — ABNORMAL LOW (ref 8.9–10.3)
Chloride: 103 mmol/L (ref 98–111)
Creatinine, Ser: 0.73 mg/dL (ref 0.61–1.24)
GFR, Estimated: >60 mL/min (ref >60)
Glucose, Bld: 94 mg/dL (ref 70–99)
Potassium: 3.4 mmol/L — ABNORMAL LOW (ref 3.5–5.1)
Sodium: 135 mmol/L (ref 135–145)
Total Bilirubin: 1.4 mg/dL — ABNORMAL HIGH (ref 0.0–1.2)
Total Protein: 6.4 g/dL — ABNORMAL LOW (ref 6.5–8.1)

## 2023-04-16 LAB — LIPASE, BLOOD: Lipase: 25 U/L (ref 11–51)

## 2023-04-16 LAB — I-STAT CG4 LACTIC ACID, ED
Lactic Acid, Venous: 1.6 mmol/L (ref 0.5–1.9)
Lactic Acid, Venous: 0.5 mmol/L (ref 0.5–1.9)

## 2023-04-16 LAB — RAPID URINE DRUG SCREEN, HOSP PERFORMED
Amphetamines: NOT DETECTED
Barbiturates: NOT DETECTED
Benzodiazepines: NOT DETECTED
Cocaine: NOT DETECTED
Opiates: NOT DETECTED
Tetrahydrocannabinol: POSITIVE — AB

## 2023-04-16 LAB — TYPE AND SCREEN
ABO/RH(D): O NEG
Antibody Screen: NEGATIVE

## 2023-04-16 LAB — ETHANOL: Alcohol, Ethyl (B): <10 mg/dL (ref <10)

## 2023-04-16 LAB — TROPONIN I (HIGH SENSITIVITY)
Troponin I (High Sensitivity): 7 ng/L (ref <18)
Troponin I (High Sensitivity): 8 ng/L (ref <18)

## 2023-04-16 MED ORDER — HYDROMORPHONE HCL 1 MG/ML IJ SOLN
1.0000 mg | Freq: Once | INTRAMUSCULAR | Status: AC
  Administered 2023-04-16: 1 mg via INTRAVENOUS
  Filled 2023-04-16: qty 1

## 2023-04-16 MED ORDER — IOHEXOL 300 MG/ML  SOLN
100.0000 mL | Freq: Once | INTRAMUSCULAR | Status: AC | PRN
  Administered 2023-04-16: 100 mL via INTRAVENOUS

## 2023-04-16 MED ORDER — PROMETHAZINE HCL 25 MG PO TABS: 25.0000 mg | ORAL_TABLET | Freq: Four times a day (QID) | ORAL | 0 refills | Status: DC | PRN

## 2023-04-16 MED ORDER — LORAZEPAM 1 MG PO TABS: 1.0000 mg | ORAL_TABLET | Freq: Two times a day (BID) | ORAL | 0 refills | Status: DC | PRN

## 2023-04-16 MED ORDER — DICYCLOMINE HCL 20 MG PO TABS: 20.0000 mg | ORAL_TABLET | Freq: Two times a day (BID) | ORAL | 0 refills | Status: DC | PRN

## 2023-04-16 MED ORDER — SODIUM CHLORIDE 0.9 % IV BOLUS
1000.0000 mL | Freq: Once | INTRAVENOUS | Status: AC
  Administered 2023-04-16: 1000 mL via INTRAVENOUS

## 2023-04-16 MED ORDER — MORPHINE SULFATE (PF) 4 MG/ML IV SOLN
4.0000 mg | Freq: Once | INTRAVENOUS | Status: AC
  Administered 2023-04-16: 4 mg via INTRAVENOUS
  Filled 2023-04-16: qty 1

## 2023-04-16 MED ORDER — LORAZEPAM 2 MG/ML IJ SOLN
1.0000 mg | Freq: Once | INTRAMUSCULAR | Status: AC
  Administered 2023-04-16: 1 mg via INTRAVENOUS
  Filled 2023-04-16: qty 1

## 2023-04-16 MED ORDER — ONDANSETRON HCL 4 MG/2ML IJ SOLN
4.0000 mg | Freq: Once | INTRAMUSCULAR | Status: AC
  Administered 2023-04-16: 4 mg via INTRAVENOUS
  Filled 2023-04-16: qty 2

## 2023-04-16 NOTE — ED Provider Notes (Signed)
 Pt signs out by Dr. Rodena Medin pending CT scans.  CT abd/pelvis films reviewed by me.  I agree with radiologist.   No acute abdominopelvic findings.  2. Punctate nonobstructing right renal stones.  3. Partially imaged multichamber cardiomegaly with reflux of  contrast material into the hepatic veins, suggesting a degree of  right heart dysfunction.  4. Mobile cecum is located within the right upper quadrant.  5.  Aortic Atherosclerosis (ICD10-I70.0).   UA neg other than ketones and protein.  No uti.  Uds + MJ.  Pt is feeling much better after meds.  He uses MJ frequently to help control pain.  I suspect he has hyperemesis syn.  Pt is stable for d/c.  He is to f/u with pcp/GI.  Return if worse.    Jacalyn Lefevre, MD 04/16/23 [phone]

## 2023-04-16 NOTE — Discharge Instructions (Addendum)
 Avoid using marijuana.

## 2023-04-16 NOTE — ED Provider Notes (Signed)
 [place] [facility] DE[facility]MENT AT [facility] Provider Note   CSN: [id number] Arrival date & time: 04/16/23  1233     History  Chief Complaint  Patient presents with   Abdominal Pain   Emesis    Brandon [name] [name]. is a 73 y.o. male.  73 year old male with prior medical history as detailed below presents for evaluation.  Patient complains of severe diffuse abdominal cramping x 4 to 5 days.  He reports associated subjective fever.  He reports multiple episodes of emesis.  Patient reports that he uses marijuana frequently for treatment of his chronic back pain related to scoliosis.  He is concerned that he could have "hyperemesis syndrome".  He apparently googled his symptoms prior to coming in.  He reports that we can check his "THC" level to "see how high I am".  The history is provided by the patient.       Home Medications Prior to Admission medications   Medication Sig Start Date End Date Taking? Authorizing Provider  amLODipine  (NORVASC ) 5 MG tablet Take 1 tablet (5 mg total) by mouth daily. 12/07/22   Mealor, Brandon E, MD  apixaban  (ELIQUIS ) 5 MG TABS tablet Take 1 tablet (5 mg total) by mouth 2 (two) times daily. 11/23/22   Mealor, Brandon E, MD  atorvastatin  (LIPITOR) 40 MG tablet TAKE 1 TABLET BY MOUTH EVERY DAY 09/07/22   Brandon Knuckles, MD  carvedilol  (COREG ) 25 MG tablet Take 1 tablet (25 mg total) by mouth 2 (two) times daily with a meal. 11/16/22   Mealor, Brandon Gaba, MD  dutasteride (AVODART) 0.5 MG capsule Take 0.5 mg by mouth daily. 03/27/21   [provider]  escitalopram  (LEXAPRO ) 20 MG tablet Take 1 tablet (20 mg total) by mouth daily. 01/18/23   Brandon Knuckles, MD  flecainide  (TAMBOCOR ) 50 MG tablet TAKE 1 TABLET BY MOUTH TWICE A DAY 03/30/23   Mealor, Brandon E, MD  fluticasone  (FLONASE ) 50 MCG/ACT nasal spray SPRAY 2 SPRAYS INTO EACH NOSTRIL EVERY DAY 01/15/23   Brandon Knuckles, MD  gabapentin  (NEURONTIN ) 800 MG tablet Take 1 tablet (800 mg  total) by mouth at bedtime. 04/02/23   Brandon Dauphin, MD  KLOR-CON  M20 20 MEQ tablet TAKE 1 TABLET BY MOUTH EVERY DAY 08/25/22   Brandon Knuckles, MD  memantine  (NAMENDA ) 10 MG tablet TAKE ONE TABLET BY MOUTH TWICE A DAY 02/14/23   Brandon Knuckles, MD  ondansetron  (ZOFRAN -ODT) 4 MG disintegrating tablet Take 1 tablet (4 mg total) by mouth every 8 (eight) hours as needed for nausea or vomiting. 04/07/23   Brandon Hong, PA-C  spironolactone  (ALDACTONE ) 25 MG tablet TAKE ONE TABLET BY MOUTH DAILY 02/18/23   Brandon Knuckles, MD  tamsulosin  (FLOMAX ) 0.4 MG CAPS capsule Take 1 capsule (0.4 mg total) by mouth daily. 01/18/23   Brandon Knuckles, MD  triamcinolone  cream (KENALOG ) 0.1 % Apply 1 Application topically 2 (two) times daily as needed (rash).    [provider]      Allergies    Ace inhibitors and Albuterol     Review of Systems   Review of Systems  All other systems reviewed and are negative.   Physical Exam Updated Vital Signs There were no vitals taken for this visit. Physical Exam Vitals and nursing note reviewed.  Constitutional:      General: He is not in acute distress.    Appearance: Normal appearance. He is well-developed.  HENT:  Head: Normocephalic and atraumatic.  Eyes:     Conjunctiva/sclera: Conjunctivae normal.     Pupils: Pupils are equal, round, and reactive to light.  Cardiovascular:     Rate and Rhythm: Normal rate and regular rhythm.     Heart sounds: Normal heart sounds.  Pulmonary:     Effort: Pulmonary effort is normal. No respiratory distress.     Breath sounds: Normal breath sounds.  Abdominal:     General: There is no distension.     Palpations: Abdomen is soft.     Tenderness: There is generalized abdominal tenderness.  Musculoskeletal:        General: No deformity. Normal range of motion.     Cervical back: Normal range of motion and neck supple.  Skin:    General: Skin is warm and dry.  Neurological:     General: No focal deficit  present.     Mental Status: He is alert and oriented to person, place, and time.     ED Results / Procedures / Treatments   Labs (all labs ordered are listed, but only abnormal results are displayed) Labs Reviewed  CBC WITH DIFFERENTIAL/PLATELET  ETHANOL  COMPREHENSIVE METABOLIC PANEL WITH GFR  LIPASE, BLOOD  URINALYSIS, W/ REFLEX TO CULTURE (INFECTION SUSPECTED)  RAPID URINE DRUG SCREEN, HOSP PERFORMED  I-STAT CG4 LACTIC ACID, ED  TYPE AND SCREEN  TROPONIN I (HIGH SENSITIVITY)    EKG None  Radiology No results found.  Procedures Procedures    Medications Ordered in ED Medications  sodium chloride  0.9 % bolus 1,000 mL (has no administration in time range)  ondansetron  (ZOFRAN ) injection 4 mg (has no administration in time range)    ED Course/ Medical Decision Making/ A&P                                 Medical Decision Making Amount and/or Complexity of Data Reviewed Labs: ordered. Radiology: ordered.  Risk Prescription drug management.    Medical Screen Complete  This patient presented to the ED with complaint of abdominal pain.  This complaint involves an extensive number of treatment options. The initial differential diagnosis includes, but is not limited to, hyperemesis syndrome, bowel obstruction, appendicitis, gastroenteritis, metabolic abnormality, etc.  This presentation is: Acute, Chronic, Self-Limited, Previously Undiagnosed, Uncertain Prognosis, Complicated, Systemic Symptoms, and Threat to Life/Bodily Function  Patient is presenting with approximately 4 days of abdominal cramping, nausea, vomiting.  Patient reports significant regular use of THC.  Patient reports prior history of hyperemesis syndrome secondary to marijuana use.  He is convinced that this is the cause of his symptoms.  Patient with diffuse abdominal cramping on exam.  Patient would benefit from workup including IV, IV fluids, labs, CT imaging.  Initial labs are reassuring  with white count of 6.7, lipase of 25, creatinine is 0.73.  Potassium is mildly decreased at 3.4.  Oncoming EDP is aware of pending imaging/labs and need for disposition.     Co morbidities that complicated the patient's evaluation  See HPI   Additional history obtained:  External records from outside sources obtained and reviewed including prior ED visits and prior Inpatient records.    Problem List / ED Course:  Abdominal pain, Nausea, vomiting  Disposition:  After consideration of the diagnostic results and the patients response to treatment, I feel that the patent would benefit from completion of ED evaluation.          Final  Clinical Impression(s) / ED Diagnoses Final diagnoses:  Generalized abdominal pain  Nausea and vomiting, unspecified vomiting type    Rx / DC Orders ED Discharge Orders     None         Brandon Carte, MD 04/23/23 [phone]

## 2023-04-16 NOTE — ED Triage Notes (Signed)
 Pt BIB EMS from home, c/o severe abdominal cramping x 2 days. Last known marijuana use was Wednesday. Pt wants to quit drug use and is interested in speaking to case management. 250 LR, 150 mcg of fentanyl   BP 114/62 P 60-64 afib RR 16 SpO2 97%

## 2023-04-18 LAB — URINE CULTURE: Culture: 70000 — AB

## 2023-04-19 ENCOUNTER — Telehealth (HOSPITAL_BASED_OUTPATIENT_CLINIC_OR_DEPARTMENT_OTHER): Payer: Self-pay

## 2023-04-19 NOTE — Telephone Encounter (Signed)
 Post ED Visit - Positive Culture Follow-up  Culture report reviewed by antimicrobial stewardship pharmacist: [facility] []  Court Distance, Pharm.D. []  Skeet Duke, Pharm.D., BCPS AQ-ID []  Leslee Rase, Pharm.D., BCPS []  Garland Junk, [address].D., BCPS []  [place], [address].D., BCPS, AAHIVP []  Alcide Aly, Pharm.D., BCPS, AAHIVP []  Jerri Morale, PharmD, BCPS []  Graham Laws, PharmD, BCPS []  Cleda Curly, PharmD, BCPS []  Tamar Fairly, PharmD []  Ballard Levels, PharmD, BCPS []  Ollen Beverage, PharmD  [facility] [x]  Skeet Duke, PharmD []  Sherryle Don, PharmD []  Van Gelinas, PharmD []  Delila Felty, Rph []  [place]) Cleora Daft, PharmD []  Augustina Block, PharmD []  Arie Kurtz, PharmD []  Sharlyn Deaner, PharmD []  Agnes Hose, PharmD []  Kendall Pauls, PharmD []  Gladstone Lamer, PharmD []  Armanda Bern, PharmD []  Tera Fellows, PharmD   Positive urine culture Reviewed by Wynetta Heckle, MD  Not treated UA - WBC - 11-20; sq cells - 0-5 CT scan no acute findings UDS - Marijuana MD suspects hyperemesis Got IV fluids, hydromorphone, lorazepam, zofran, morphine. Felt better - released No antibiotics needed No further patient follow-up is required at this time.  Delena Feil 04/19/2023, 12:30 PM

## 2023-04-21 ENCOUNTER — Ambulatory Visit (HOSPITAL_COMMUNITY): Payer: Medicare Other | Admitting: Physician Assistant

## 2023-05-02 ENCOUNTER — Encounter: Payer: Self-pay | Admitting: Internal Medicine

## 2023-05-02 DIAGNOSIS — R1084 Generalized abdominal pain: Secondary | ICD-10-CM | POA: Diagnosis not present

## 2023-05-04 ENCOUNTER — Ambulatory Visit (INDEPENDENT_AMBULATORY_CARE_PROVIDER_SITE_OTHER): Admitting: Internal Medicine

## 2023-05-04 ENCOUNTER — Encounter: Payer: Self-pay | Admitting: Internal Medicine

## 2023-05-04 VITALS — BP 106/76 | HR 62 | Temp 98.7°F | Resp 16 | Ht 76.0 in | Wt 270.0 lb

## 2023-05-04 DIAGNOSIS — F12188 Cannabis abuse with other cannabis-induced disorder: Secondary | ICD-10-CM | POA: Diagnosis not present

## 2023-05-04 DIAGNOSIS — G2581 Restless legs syndrome: Secondary | ICD-10-CM | POA: Diagnosis not present

## 2023-05-04 DIAGNOSIS — I480 Paroxysmal atrial fibrillation: Secondary | ICD-10-CM

## 2023-05-04 DIAGNOSIS — E785 Hyperlipidemia, unspecified: Secondary | ICD-10-CM

## 2023-05-04 DIAGNOSIS — F331 Major depressive disorder, recurrent, moderate: Secondary | ICD-10-CM

## 2023-05-04 DIAGNOSIS — E876 Hypokalemia: Secondary | ICD-10-CM

## 2023-05-04 LAB — BASIC METABOLIC PANEL WITH GFR
BUN: 10 mg/dL (ref 6–23)
CO2: 30 meq/L (ref 19–32)
Calcium: 9.2 mg/dL (ref 8.4–10.5)
Chloride: 100 meq/L (ref 96–112)
Creatinine, Ser: 1.02 mg/dL (ref 0.40–1.50)
GFR: 72.22 mL/min (ref >60.00)
Glucose, Bld: 96 mg/dL (ref 70–99)
Potassium: 3.4 meq/L — ABNORMAL LOW (ref 3.5–5.1)
Sodium: 139 meq/L (ref 135–145)

## 2023-05-04 LAB — TSH: TSH: 1.35 u[IU]/mL (ref 0.35–5.50)

## 2023-05-04 MED ORDER — GABAPENTIN 800 MG PO TABS: 800.0000 mg | ORAL_TABLET | Freq: Every day | ORAL | 0 refills | Status: DC

## 2023-05-04 MED ORDER — ATORVASTATIN CALCIUM 40 MG PO TABS: 40.0000 mg | ORAL_TABLET | Freq: Every day | ORAL | 1 refills | Status: DC

## 2023-05-04 MED ORDER — POTASSIUM CHLORIDE CRYS ER 20 MEQ PO TBCR: 20.0000 meq | EXTENDED_RELEASE_TABLET | Freq: Every day | ORAL | 0 refills | Status: DC

## 2023-05-04 MED ORDER — ESCITALOPRAM OXALATE 20 MG PO TABS: 20.0000 mg | ORAL_TABLET | Freq: Every day | ORAL | 1 refills | Status: DC

## 2023-05-04 NOTE — Progress Notes (Signed)
 Subjective:  Patient ID: Brandon Ege., male    DOB: 1950/05/20  Age: 73 y.o. MRN: [id number]  CC: Atrial Fibrillation   HPI Brandon B Sackrider Jr. presents for f/up ----  Discussed the use of AI scribe software for clinical note transcription with the patient, who gave verbal consent to proceed.  History of Present Illness   Brandon B Leal Pascall. is a 73 year old male with cannabis hyperemesis syndrome who presents with nausea and vomiting.  He has been experiencing symptoms consistent with cannabis hyperemesis syndrome, including nausea and vomiting. He has used cannabis daily for approximately 20 years, primarily for spinal stenosis and arthritis pain management. He quit cannabis use 72 hours ago and notes feeling better today than yesterday, although he still feels 'under the weather'.  He has been experiencing nausea but has not vomited for the past three to four days. He describes having 'dry heaves' and a sore stomach. He has been using Zofran  for nausea and vomiting, and reports constipation, which he attributes to Zofran  use. No blood in stool or vomit.  He has a history of using gabapentin  and Lyrica  for pain management but has not taken these medications recently. He was previously prescribed gabapentin  three times a day, which was later reduced to once a day, and he has since run out. He was advised to stop Lyrica  when taking gabapentin .  He reports low blood pressure and feelings of weakness, dizziness, and lightheadedness. He is currently taking spironolactone . No suicidal ideation or hallucinations but mentions experiencing withdrawal symptoms, including a strong urge to smoke cannabis after breakfast.  He visited the emergency room where he received medications for anxiety and vomiting, and underwent an EKG and blood tests, which he recalls were normal. He was informed of abnormal electrolytes and is advised to recheck his potassium levels.       Outpatient Medications Prior to Visit   Medication Sig Dispense Refill   apixaban  (ELIQUIS ) 5 MG TABS tablet Take 1 tablet (5 mg total) by mouth 2 (two) times daily. 60 tablet 5   carvedilol  (COREG ) 25 MG tablet Take 1 tablet (25 mg total) by mouth 2 (two) times daily with a meal. 180 tablet 3   dutasteride (AVODART) 0.5 MG capsule Take 0.5 mg by mouth daily.     flecainide  (TAMBOCOR ) 50 MG tablet TAKE 1 TABLET BY MOUTH TWICE A DAY 180 tablet 2   fluticasone  (FLONASE ) 50 MCG/ACT nasal spray SPRAY 2 SPRAYS INTO EACH NOSTRIL EVERY DAY 48 mL 1   memantine  (NAMENDA ) 10 MG tablet TAKE ONE TABLET BY MOUTH TWICE A DAY 180 tablet 0   tamsulosin  (FLOMAX ) 0.4 MG CAPS capsule Take 1 capsule (0.4 mg total) by mouth daily. 90 capsule 1   amLODipine  (NORVASC ) 5 MG tablet Take 1 tablet (5 mg total) by mouth daily. 90 tablet 3   atorvastatin  (LIPITOR) 40 MG tablet TAKE 1 TABLET BY MOUTH EVERY DAY 90 tablet 1   dicyclomine  (BENTYL ) 20 MG tablet Take 1 tablet (20 mg total) by mouth 2 (two) times daily as needed (abdominal spasms). 20 tablet 0   escitalopram  (LEXAPRO ) 20 MG tablet Take 1 tablet (20 mg total) by mouth daily. 90 tablet 1   gabapentin  (NEURONTIN ) 800 MG tablet Take 1 tablet (800 mg total) by mouth at bedtime. 30 tablet 0   KLOR-CON  M20 20 MEQ tablet TAKE 1 TABLET BY MOUTH EVERY DAY 90 tablet 0   LORazepam  (ATIVAN ) 1 MG tablet Take 1 tablet (1  mg total) by mouth 2 (two) times daily as needed for anxiety. 10 tablet 0   ondansetron  (ZOFRAN -ODT) 4 MG disintegrating tablet Take 1 tablet (4 mg total) by mouth every 8 (eight) hours as needed for nausea or vomiting. 20 tablet 0   promethazine  (PHENERGAN ) 25 MG tablet Take 1 tablet (25 mg total) by mouth every 6 (six) hours as needed for nausea or vomiting. 10 tablet 0   spironolactone  (ALDACTONE ) 25 MG tablet TAKE ONE TABLET BY MOUTH DAILY 90 tablet 0   triamcinolone  cream (KENALOG ) 0.1 % Apply 1 Application topically 2 (two) times daily as needed (rash).     No facility-administered medications  prior to visit.    ROS Review of Systems  Constitutional:  Positive for unexpected weight change (wt loss). Negative for chills, diaphoresis and fatigue.  HENT: Negative.    Respiratory: Negative.  Negative for chest tightness, shortness of breath and wheezing.   Gastrointestinal:  Positive for nausea. Negative for abdominal pain, blood in stool, constipation, diarrhea and vomiting.  Musculoskeletal:  Positive for back pain. Negative for gait problem and myalgias.  Skin: Negative.   Neurological:  Positive for dizziness, weakness and light-headedness. Negative for numbness.  Psychiatric/Behavioral:  Positive for confusion, decreased concentration, dysphoric mood and sleep disturbance. Negative for behavioral problems, hallucinations, self-injury and suicidal ideas. The patient is nervous/anxious. The patient is not hyperactive.     Objective:  BP 106/76 (BP Location: Left Arm, Patient Position: Sitting, Cuff Size: Normal)   Pulse 62   Temp 98.7 F (37.1 C) (Oral)   Resp 16   Ht 6\' 4"  (1.93 m)   Wt 270 lb (122.5 kg)   SpO2 93%   BMI 32.87 kg/m   BP Readings from Last 3 Encounters:  05/04/23 106/76  04/16/23 (!) 142/93  04/02/23 90/60    Wt Readings from Last 3 Encounters:  05/04/23 270 lb (122.5 kg)  04/02/23 277 lb 12.8 oz (126 kg)  02/15/23 279 lb (126.6 kg)    Physical Exam Vitals reviewed.  HENT:     Mouth/Throat:     Mouth: Mucous membranes are moist.  Eyes:     General: No scleral icterus. Cardiovascular:     Rate and Rhythm: Normal rate. Rhythm irregularly irregular.     Heart sounds: No murmur heard.    No friction rub. No gallop.  Pulmonary:     Effort: Pulmonary effort is normal.     Breath sounds: No stridor. No wheezing, rhonchi or rales.  Abdominal:     General: Abdomen is flat. Bowel sounds are normal. There is no distension.     Palpations: Abdomen is soft. There is no hepatomegaly, splenomegaly or mass.     Tenderness: There is no abdominal  tenderness. There is no guarding or rebound.  Musculoskeletal:        General: Normal range of motion.     Cervical back: Neck supple.     Right lower leg: No edema.     Left lower leg: No edema.  Lymphadenopathy:     Cervical: No cervical adenopathy.  Skin:    General: Skin is warm and dry.  Neurological:     General: No focal deficit present.     Mental Status: He is alert. Mental status is at baseline.  Psychiatric:        Attention and Perception: He is inattentive.        Mood and Affect: Mood is anxious. Mood is not depressed. Affect is not labile.  Speech: Speech is tangential.        Behavior: Behavior normal. Behavior is cooperative.        Thought Content: Thought content normal.        Cognition and Memory: Cognition is impaired. Memory is impaired.        Judgment: Judgment normal.     Lab Results  Component Value Date   WBC 6.7 04/16/2023   HGB 15.5 04/16/2023   HCT 45.5 04/16/2023   PLT 166 04/16/2023   GLUCOSE 96 05/04/2023   CHOL 186 06/09/2022   TRIG 94.0 06/09/2022   HDL 36.40 (L) 06/09/2022   LDLDIRECT 138 (H) 10/21/2016   LDLCALC 131 (H) 06/09/2022   ALT 10 04/16/2023   AST 16 04/16/2023   NA 139 05/04/2023   K 3.4 (L) 05/04/2023   CL 100 05/04/2023   CREATININE 1.02 05/04/2023   BUN 10 05/04/2023   CO2 30 05/04/2023   TSH 1.35 05/04/2023   PSA 0.24 01/18/2023   INR 2.1 04/01/2018   HGBA1C 5.8 01/18/2023    CT ABDOMEN PELVIS W CONTRAST Result Date: 04/16/2023 CLINICAL DATA:  Two day history of severe abdominal cramping EXAM: CT ABDOMEN AND PELVIS WITH CONTRAST TECHNIQUE: Multidetector CT imaging of the abdomen and pelvis was performed using the standard protocol following bolus administration of intravenous contrast. RADIATION DOSE REDUCTION: This exam was performed according to the departmental dose-optimization program which includes automated exposure control, adjustment of the mA and/or kV according to patient size and/or use of  iterative reconstruction technique. CONTRAST:  OMNIPAQUE  IOHEXOL  300 MG/ML  SOLN COMPARISON:  CT abdomen and pelvis dated 11/16/2022 FINDINGS: Lower chest: No focal consolidation or pulmonary nodule in the lung bases. No pleural effusion or pneumothorax demonstrated. Partially imaged multichamber cardiomegaly. Reflux of contrast material into the hepatic veins, suggesting a degree of right heart dysfunction. Hepatobiliary: No focal hepatic lesions. No intra or extrahepatic biliary ductal dilation. Normal gallbladder. Pancreas: No focal lesions or main ductal dilation. Spleen: Normal in size without focal abnormality. Adrenals/Urinary Tract: No adrenal nodules. No suspicious renal mass or hydronephrosis. Punctate nonobstructing right renal stones. No focal bladder wall thickening. Decompressed bladder diverticulum arising from the bladder dome. Stomach/Bowel: Normal appearance of the stomach. Duodenal diverticulum arising from the second portion of the duodenum. No evidence of bowel wall thickening, distention, or inflammatory changes. Mobile cecum within the right upper quadrant. Colonic diverticulosis without acute diverticulitis. Normal appendix. Vascular/Lymphatic: Aortic atherosclerosis. No enlarged abdominal or pelvic lymph nodes. Reproductive: Prostate is unremarkable. Other: No free fluid, fluid collection, or free air. Musculoskeletal: No acute or abnormal lytic or blastic osseous lesions. Multilevel degenerative changes of the partially imaged thoracic and lumbar spine. Focal kyphosis of the lumbar spine centered at L1-2. Small fat-containing bilateral inguinal hernias. Degenerative changes of the hips. IMPRESSION: 1. No acute abdominopelvic findings. 2. Punctate nonobstructing right renal stones. 3. Partially imaged multichamber cardiomegaly with reflux of contrast material into the hepatic veins, suggesting a degree of right heart dysfunction. 4. Mobile cecum is located within the right upper  quadrant. 5.  Aortic Atherosclerosis (ICD10-I70.0). Electronically Signed   By: Limin  Xu M.D.   On: 04/16/2023 16:24    Assessment & Plan:  Chronic hypokalemia -     Potassium Chloride  Crys ER; Take 1 tablet (20 mEq total) by mouth daily.  Dispense: 90 tablet; Refill: 0 -     Basic metabolic panel with GFR; Future  Hyperlipidemia LDL goal <130 -     Atorvastatin  Calcium ; Take 1  tablet (40 mg total) by mouth daily.  Dispense: 90 tablet; Refill: 1 -     TSH; Future  Restless leg syndrome -     Gabapentin ; Take 1 tablet (800 mg total) by mouth at bedtime.  Dispense: 90 tablet; Refill: 0  Cannabis hyperemesis syndrome concurrent with and due to cannabis abuse [facility])- He agrees to abstain from [facility].  Moderate episode of recurrent major depressive disorder (HCC) -     Escitalopram  Oxalate; Take 1 tablet (20 mg total) by mouth daily.  Dispense: 90 tablet; Refill: 1  Paroxysmal atrial fibrillation (HCC)- He has good rate control but he is hypotensive. Will hold the CCB and spironolactone . -     AMB Referral [facility] Care Management     Follow-up: Return in about 3 months (around 08/03/2023).  Sandra Crouch, MD

## 2023-05-04 NOTE — Patient Instructions (Signed)
 Hypokalemia Hypokalemia means that the amount of potassium in the blood is lower than normal. Potassium is a mineral (electrolyte) that helps regulate the amount of fluid in the body. It also stimulates muscle tightening (contraction) and helps nerves work properly. Normally, most of the body's potassium is inside cells, and only a very small amount is in the blood. Because the amount in the blood is so small, minor changes to potassium levels in the blood can be life-threatening. What are the causes? This condition may be caused by: Antibiotic medicine. Diarrhea or vomiting. Taking too much of a medicine that helps you have a bowel movement (laxative) can cause diarrhea and lead to hypokalemia. Chronic kidney disease (CKD). Medicines that help the body get rid of excess fluid (diuretics). Eating disorders, such as anorexia or bulimia. Low magnesium levels in the body. Sweating a lot. What are the signs or symptoms? Symptoms of this condition include: Weakness. Constipation. Fatigue. Muscle cramps. Mental confusion. Skipped heartbeats or irregular heartbeat (palpitations). Tingling or numbness. How is this diagnosed? This condition is diagnosed with a blood test. How is this treated? This condition may be treated by: Taking potassium supplements. Adjusting the medicines that you take. Eating more foods that contain a lot of potassium. If your potassium level is very low, you may need to get potassium through an IV and be monitored in the hospital. Follow these instructions at home: Eating and drinking  Eat a healthy diet. A healthy diet includes fresh fruits and vegetables, whole grains, healthy fats, and lean proteins. If told, eat more foods that contain a lot of potassium. These include: Nuts, such as peanuts and pistachios. Seeds, such as sunflower seeds and pumpkin seeds. Peas, lentils, and lima beans. Whole grain and bran cereals and breads. Fresh fruits and vegetables,  such as apricots, avocado, bananas, cantaloupe, kiwi, oranges, tomatoes, asparagus, and potatoes. Juices, such as orange, tomato, and prune. Lean meats, including fish. Milk and milk products, such as yogurt. General instructions Take over-the-counter and prescription medicines only as told by your health care provider. This includes vitamins, natural food products, and supplements. Keep all follow-up visits. This is important. Contact a health care provider if: You have weakness that gets worse. You feel your heart pounding or racing. You vomit. You have diarrhea. You have diabetes and you have trouble keeping your blood sugar in your target range. Get help right away if: You have chest pain. You have shortness of breath. You have vomiting or diarrhea that lasts for more than 2 days. You faint. These symptoms may be an emergency. Get help right away. Call 911. Do not wait to see if the symptoms will go away. Do not drive yourself to the hospital. Summary Hypokalemia means that the amount of potassium in the blood is lower than normal. This condition is diagnosed with a blood test. Hypokalemia may be treated by taking potassium supplements, adjusting the medicines that you take, or eating more foods that are high in potassium. If your potassium level is very low, you may need to get potassium through an IV and be monitored in the hospital. This information is not intended to replace advice given to you by your health care provider. Make sure you discuss any questions you have with your health care provider. Document Revised: 09/05/2020 Document Reviewed: 09/05/2020 Elsevier Patient Education  2024 [employer].

## 2023-05-05 ENCOUNTER — Ambulatory Visit (HOSPITAL_COMMUNITY): Admitting: Physician Assistant

## 2023-05-05 ENCOUNTER — Encounter (HOSPITAL_COMMUNITY): Payer: Self-pay

## 2023-05-06 ENCOUNTER — Telehealth: Payer: Self-pay | Admitting: *Deleted

## 2023-05-06 NOTE — Progress Notes (Signed)
 Care Guide Pharmacy Note  05/06/2023 Name: Brandon [name]. MRN: [id number] DOB: 10-16-1950  Referred By: Arcadio Knuckles, MD Reason for referral: Complex Care Management (Outreach to schedule referral with pharmacist )   Brandon Ege. is a 73 y.o. year old male who is a primary care patient of Arcadio Knuckles, MD.  Brandon Ege. was referred to the pharmacist for assistance related to: Atrial Fibrillation  Successful contact was made with the patient to discuss pharmacy services including being ready for the pharmacist to call at least 5 minutes before the scheduled appointment time and to have medication bottles and any blood pressure readings ready for review. The patient agreed to meet with the pharmacist via telephone visit on 05/20/2023  Brandon [name], CMA [place]  [facility], [facility] Guide Direct Dial: [phone]  Fax: [phone] Website: [email or web address]

## 2023-05-12 ENCOUNTER — Other Ambulatory Visit: Payer: Self-pay | Admitting: Internal Medicine

## 2023-05-12 DIAGNOSIS — R4189 Other symptoms and signs involving cognitive functions and awareness: Secondary | ICD-10-CM

## 2023-05-14 ENCOUNTER — Ambulatory Visit (HOSPITAL_COMMUNITY)
Admission: RE | Admit: 2023-05-14 | Discharge: 2023-05-14 | Disposition: A | Discharge status: Home / Self Care | Source: Ambulatory Visit | Attending: Physician Assistant | Admitting: Physician Assistant

## 2023-05-14 ENCOUNTER — Encounter (HOSPITAL_COMMUNITY): Payer: Self-pay | Admitting: Physician Assistant

## 2023-05-14 VITALS — BP 118/90 | HR 87 | Ht 76.0 in | Wt 270.8 lb

## 2023-05-14 DIAGNOSIS — I4819 Other persistent atrial fibrillation: Secondary | ICD-10-CM | POA: Insufficient documentation

## 2023-05-14 DIAGNOSIS — D6869 Other thrombophilia: Secondary | ICD-10-CM | POA: Insufficient documentation

## 2023-05-14 DIAGNOSIS — Z79899 Other long term (current) drug therapy: Secondary | ICD-10-CM | POA: Diagnosis not present

## 2023-05-14 DIAGNOSIS — Z5181 Encounter for therapeutic drug level monitoring: Secondary | ICD-10-CM | POA: Insufficient documentation

## 2023-05-14 LAB — CBC
Hematocrit: 43 % (ref 37.5–51.0)
Hemoglobin: 14.6 g/dL (ref 13.0–17.7)
MCH: 30.5 pg (ref 26.6–33.0)
MCHC: 34 g/dL (ref 31.5–35.7)
MCV: 90 fL (ref 79–97)
Platelets: 178 10*3/uL (ref 150–450)
RBC: 4.78 x10E6/uL (ref 4.14–5.80)
RDW: 13.1 % (ref 11.6–15.4)
WBC: 7.1 10*3/uL (ref 3.4–10.8)

## 2023-05-14 LAB — BASIC METABOLIC PANEL WITH GFR
BUN/Creatinine Ratio: 10 (ref 10–24)
BUN: 9 mg/dL (ref 8–27)
CO2: 26 mmol/L (ref 20–29)
Calcium: 9.3 mg/dL (ref 8.6–10.2)
Chloride: 102 mmol/L (ref 96–106)
Creatinine, Ser: 0.91 mg/dL (ref 0.76–1.27)
Glucose: 104 mg/dL — ABNORMAL HIGH (ref 70–99)
Potassium: 3.5 mmol/L (ref 3.5–5.2)
Sodium: 142 mmol/L (ref 134–144)
eGFR: 88 mL/min/{1.73_m2} (ref >59)

## 2023-05-14 NOTE — H&P (View-Only) (Signed)
 Primary Care Physician: Arcadio Knuckles, MD Referring Physician: Dr. Nunzio Belch Primary EP: Dr Arlester Ladd   Brandon [name] [name]. is a 73 y.o. male with a h/o afib, s/p ablation, maintaining SR, off amiodarone . He had one episode of afib in July, none since then He has gained 20 lbs since Covid inactivity. He had rt knee replacement in May.  He recently had 2 separet ER visits for SVT with RVR and very hypotensive. The first time in ER, he was given adenosine  and broke but with return of SVT, he was cardioverted. He went home only to have  the same scenario to repeat again the next day. This time he was kept and treated. These episodes  may have been set off by pt not having his coreg  25 mg bid for a week as he was waiting for refills. He was given amiodarone  IV but when he returned to SR, this was stopped and he was gotten back on his BB. Pt states that Dr. Carolynne Citron wanted to see him after discharge  to  discuss SVT ablation. CHA2DS2VASc of 2, on eliquis .  F/u afib clinic, 06/14/19. He had an SVT ablation that was successful and he has not had any further tachyarrhythmia's. Overall he reports that he id soing well. Undergoing PT in after math of rt knee replacement. No issues with his anticoagulation.   Follow up in the AF clinic 03/27/22. Patient was seen in follow up by Mertha Abrahams on 10/21/21 and found to be in persistent afib. He underwent DCCV on 11/11/21 which was unsuccessful after 3 shocks. He was seen by Dr Arlester Ladd and underwent repeat ablation on 02/27/22. He reports that he has done very well since the ablation with no afib episodes. He denies chest pain, swallowing pain, or groin issues.   Follow up in the AF clinic 05/07/22. Patient reports that for the past week+. He is currently in the process of moving and noted that he couldn't pack more than 2-3 boxes without getting very fatigued. He checked his Kardia mobile which showed rate controlled afib. There were no specific triggers that he could identify.  He did miss a dose of Eliquis  on 4/30.  Follow up 05/05/23. Patient returns for follow up for atrial fibrillation and flecainide  monitoring. He remains in rate controlled afib today and feels fatigued. He reports that he has stopped using marijuana. No missed doses of anticoagulation in the past 3 weeks.   Today, he  denies symptoms of chest pain, shortness of breath, orthopnea, PND, lower extremity edema, dizziness, presyncope, syncope, bleeding, or neurologic sequela. The patient is tolerating medications without difficulties and is otherwise without complaint today.    Past Medical History:  Diagnosis Date   Anxiety    Arthritis    Benign neoplasm of colon    Benign prostatic hyperplasia with urinary obstruction    Bilateral inguinal hernia 05/29/2011   Bilateral pulmonary embolism (HCC) 3/14   admitted to [facility],  treated with Xarelto    Breast lump 03/29/2019   Calculus of kidney 10/21/2016   Cataract    Chronic bilateral low back pain with sciatica 03/21/2012   Chronic pain    Finger mass, right 10/17/2020   History of alcohol abuse 03/07/2016   History of colon polyps 11/18/2015   History of kidney stones    History of pulmonary embolism 03/19/2012   Hyperlipemia    Hypertension    Insomnia    Insomnia due to medical condition 12/23/2016   Polyuria  secondary to BPH   Major depressive disorder, recurrent episode (HCC)    Mitral regurgitation 04/24/2013   Mild by TEE   Morbid obesity (HCC) 03/07/2016   Nonrheumatic mitral valve insufficiency 04/25/2013   Mild by TEE  Overview:  Overview:  Mild by TEE Overview:  Overview:  Overview:  Mild by TEE Overview:  Overview:  Mild by TEE   Obesity    OSA (obstructive sleep apnea)    noncompliant with CPAP.  07/27/13- awaiting a CPAP- unable to tolerate mask   Osteoporosis    Persistent atrial fibrillation (HCC)    Recurrent unilateral inguinal hernia 07/17/2013   Restless leg syndrome    takes gabapentin    S/P right total knee  arthroplasty 05/24/2018   Sciatica    Spinal stenosis, lumbar region with neurogenic claudication    SVT (supraventricular tachycardia) (HCC) 03/07/2016   Thrombus of left atrial appendage 03/09/2013   Uncomplicated asthma 06/20/2008   Overview:  Overview:  Overview:  Overview:  Qualifier: Diagnosis of  By: Ena Harries MD, Viktoria Gray Overview:  Overview:  Qualifier: Diagnosis of  By: Ena Harries MD, Viktoria Gray   Ventral hernia, unspecified, without mention of obstruction or gangrene    right abdominal wall   Current Outpatient Medications  Medication Sig Dispense Refill   apixaban  (ELIQUIS ) 5 MG TABS tablet Take 1 tablet (5 mg total) by mouth 2 (two) times daily. 60 tablet 5   atorvastatin  (LIPITOR) 40 MG tablet Take 1 tablet (40 mg total) by mouth daily. 90 tablet 1   carvedilol  (COREG ) 25 MG tablet Take 1 tablet (25 mg total) by mouth 2 (two) times daily with a meal. 180 tablet 3   dutasteride (AVODART) 0.5 MG capsule Take 0.5 mg by mouth daily.     escitalopram  (LEXAPRO ) 20 MG tablet Take 1 tablet (20 mg total) by mouth daily. 90 tablet 1   flecainide  (TAMBOCOR ) 50 MG tablet TAKE 1 TABLET BY MOUTH TWICE A DAY 180 tablet 2   fluticasone  (FLONASE ) 50 MCG/ACT nasal spray SPRAY 2 SPRAYS INTO EACH NOSTRIL EVERY DAY 48 mL 1   gabapentin  (NEURONTIN ) 800 MG tablet Take 1 tablet (800 mg total) by mouth at bedtime. 90 tablet 0   memantine  (NAMENDA ) 10 MG tablet TAKE ONE TABLET BY MOUTH TWICE A DAY 180 tablet 0   potassium chloride  SA (KLOR-CON  M20) 20 MEQ tablet Take 1 tablet (20 mEq total) by mouth daily. 90 tablet 0   tamsulosin  (FLOMAX ) 0.4 MG CAPS capsule Take 1 capsule (0.4 mg total) by mouth daily. 90 capsule 1   No current facility-administered medications for this encounter.    ROS- All systems are reviewed and negative except as per the HPI above  Physical Exam: Vitals:   05/14/23 0909  BP: (!) 118/90  Pulse: 87  Weight: 122.8 kg  Height: 6\' 4"  (1.93 m)     Filed Weights   05/14/23 0909   Weight: 122.8 kg      GEN: Well nourished, well developed in no acute distress CARDIAC: Irregularly irregular rate and rhythm, no murmurs, rubs, gallops RESPIRATORY:  Clear to auscultation without rales, wheezing or rhonchi  ABDOMEN: Soft, non-tender, non-distended EXTREMITIES:  No edema; No deformity    EKG today demonstrates Afib, PVC Vent. rate 87 BPM PR interval * ms QRS duration 114 ms QT/QTcB 402/483 ms   Echo 12/08/21  1. Left ventricular ejection fraction, by estimation, is 55 to 60%. The  left ventricle has normal function. The left ventricle has no regional  wall motion abnormalities. There is mild asymmetric left ventricular  hypertrophy of the basal-septal segment. Diastolic function indeterminant due to AFib.   2. Right ventricular systolic function is normal. The right ventricular  size is normal. Tricuspid regurgitation signal is inadequate for assessing  PA pressure.   3. Left atrial size was severely dilated.   4. Right atrial size was mildly dilated.   5. The mitral valve is normal in structure. Mild mitral valve  regurgitation.   6. The aortic valve is tricuspid. There is mild calcification of the  aortic valve. There is mild thickening of the aortic valve. Aortic valve  regurgitation is trivial. Aortic valve sclerosis/calcification is present,  without any evidence of aortic stenosis.   7. Aortic dilatation noted. There is mild dilatation of the aortic root,  measuring 41 mm. There is moderate dilatation of the ascending aorta,  measuring 47 mm. Recommend CTA for further evaluation of aorta.   8. The inferior vena cava is normal in size with greater than 50%  respiratory variability, suggesting right atrial pressure of 3 mmHg.   Comparison(s): Compared to prior TTE in 2018, patient is now in Afib and  the ascending aorta is moderately diltated (previously measured at 43mm  per my review of old study). Recommend CTA to further evaluate aorta.     CHA2DS2-VASc Score = 3  The patient's score is based upon: CHF History: 0 HTN History: 1 Diabetes History: 0 Stroke History: 0 Vascular Disease History: 1 (aortic atherosclerosis) Age Score: 1 Gender Score: 0       ASSESSMENT AND PLAN: Persistent Atrial Fibrillation (ICD10:  I48.19) The patient's CHA2DS2-VASc score is 3, indicating a 3.2% annual risk of stroke.   S/p afib ablation 2017 and 02/27/22 Patient remains in rate controlled symptomatic afib. We discussed rhythm control options. Will arrange for DCCV. If he has quick return of afib, could consider repeat ablation with PFA. Would not increase flecainide  with baseline prolonged PR, QT in SR long for dofetilide or sotalol (480s ms).  Continue flecainide  50 mg BID Continue Eliquis  5 mg BID Continue carvedilol  25 mg BID Check bmet/cbc today.   Secondary Hypercoagulable State (ICD10:  D68.69) The patient is at significant risk for stroke/thromboembolism based upon his CHA2DS2-VASc Score of 3.  Continue Apixaban  (Eliquis ).   High Risk Medication Monitoring (ICD 10: Z79.899) Intervals on ECG acceptable for flecainide  monitoring.     HTN Stable on current regimen  SVT AVNRT s/p ablation  OSA  Encouraged nightly CPAP   Follow up in the AF clinic post DCCV.    Informed Consent   Shared Decision Making/Informed Consent The risks (stroke, cardiac arrhythmias rarely resulting in the need for a temporary or permanent pacemaker, skin irritation or burns and complications associated with conscious sedation including aspiration, arrhythmia, respiratory failure and death), benefits (restoration of normal sinus rhythm) and alternatives of a direct current cardioversion were explained in detail to Mr. Sircy and he agrees to proceed.        Myrtha Ates PA-C [facility] [address] [place], [place] [address] [phone]

## 2023-05-14 NOTE — Addendum Note (Signed)
 Encounter addended by: Tess Fife, RN on: 05/14/2023 9:56 AM  Actions taken: New alternative orders accepted, Order list changed, Diagnosis association updated

## 2023-05-14 NOTE — Progress Notes (Addendum)
 Primary Care Physician: Arcadio Knuckles, MD Referring Physician: Dr. Nunzio Belch Primary EP: Dr Arlester Ladd   Brandon [name] [name]. is a 73 y.o. male with a h/o afib, s/p ablation, maintaining SR, off amiodarone . He had one episode of afib in July, none since then He has gained 20 lbs since Covid inactivity. He had rt knee replacement in May.  He recently had 2 separet ER visits for SVT with RVR and very hypotensive. The first time in ER, he was given adenosine  and broke but with return of SVT, he was cardioverted. He went home only to have  the same scenario to repeat again the next day. This time he was kept and treated. These episodes  may have been set off by pt not having his coreg  25 mg bid for a week as he was waiting for refills. He was given amiodarone  IV but when he returned to SR, this was stopped and he was gotten back on his BB. Pt states that Dr. Carolynne Citron wanted to see him after discharge  to  discuss SVT ablation. CHA2DS2VASc of 2, on eliquis .  F/u afib clinic, 06/14/19. He had an SVT ablation that was successful and he has not had any further tachyarrhythmia's. Overall he reports that he id soing well. Undergoing PT in after math of rt knee replacement. No issues with his anticoagulation.   Follow up in the AF clinic 03/27/22. Patient was seen in follow up by Mertha Abrahams on 10/21/21 and found to be in persistent afib. He underwent DCCV on 11/11/21 which was unsuccessful after 3 shocks. He was seen by Dr Arlester Ladd and underwent repeat ablation on 02/27/22. He reports that he has done very well since the ablation with no afib episodes. He denies chest pain, swallowing pain, or groin issues.   Follow up in the AF clinic 05/07/22. Patient reports that for the past week+. He is currently in the process of moving and noted that he couldn't pack more than 2-3 boxes without getting very fatigued. He checked his Kardia mobile which showed rate controlled afib. There were no specific triggers that he could identify.  He did miss a dose of Eliquis  on 4/30.  Follow up 05/05/23. Patient returns for follow up for atrial fibrillation and flecainide  monitoring. He remains in rate controlled afib today and feels fatigued. He reports that he has stopped using marijuana. No missed doses of anticoagulation in the past 3 weeks.   Today, he  denies symptoms of chest pain, shortness of breath, orthopnea, PND, lower extremity edema, dizziness, presyncope, syncope, bleeding, or neurologic sequela. The patient is tolerating medications without difficulties and is otherwise without complaint today.    Past Medical History:  Diagnosis Date   Anxiety    Arthritis    Benign neoplasm of colon    Benign prostatic hyperplasia with urinary obstruction    Bilateral inguinal hernia 05/29/2011   Bilateral pulmonary embolism (HCC) 3/14   admitted to [facility],  treated with Xarelto    Breast lump 03/29/2019   Calculus of kidney 10/21/2016   Cataract    Chronic bilateral low back pain with sciatica 03/21/2012   Chronic pain    Finger mass, right 10/17/2020   History of alcohol abuse 03/07/2016   History of colon polyps 11/18/2015   History of kidney stones    History of pulmonary embolism 03/19/2012   Hyperlipemia    Hypertension    Insomnia    Insomnia due to medical condition 12/23/2016   Polyuria  secondary to BPH   Major depressive disorder, recurrent episode (HCC)    Mitral regurgitation 04/24/2013   Mild by TEE   Morbid obesity (HCC) 03/07/2016   Nonrheumatic mitral valve insufficiency 04/25/2013   Mild by TEE  Overview:  Overview:  Mild by TEE Overview:  Overview:  Overview:  Mild by TEE Overview:  Overview:  Mild by TEE   Obesity    OSA (obstructive sleep apnea)    noncompliant with CPAP.  07/27/13- awaiting a CPAP- unable to tolerate mask   Osteoporosis    Persistent atrial fibrillation (HCC)    Recurrent unilateral inguinal hernia 07/17/2013   Restless leg syndrome    takes gabapentin    S/P right total knee  arthroplasty 05/24/2018   Sciatica    Spinal stenosis, lumbar region with neurogenic claudication    SVT (supraventricular tachycardia) (HCC) 03/07/2016   Thrombus of left atrial appendage 03/09/2013   Uncomplicated asthma 06/20/2008   Overview:  Overview:  Overview:  Overview:  Qualifier: Diagnosis of  By: Ena Harries MD, Viktoria Gray Overview:  Overview:  Qualifier: Diagnosis of  By: Ena Harries MD, Viktoria Gray   Ventral hernia, unspecified, without mention of obstruction or gangrene    right abdominal wall   Current Outpatient Medications  Medication Sig Dispense Refill   apixaban  (ELIQUIS ) 5 MG TABS tablet Take 1 tablet (5 mg total) by mouth 2 (two) times daily. 60 tablet 5   atorvastatin  (LIPITOR) 40 MG tablet Take 1 tablet (40 mg total) by mouth daily. 90 tablet 1   carvedilol  (COREG ) 25 MG tablet Take 1 tablet (25 mg total) by mouth 2 (two) times daily with a meal. 180 tablet 3   dutasteride (AVODART) 0.5 MG capsule Take 0.5 mg by mouth daily.     escitalopram  (LEXAPRO ) 20 MG tablet Take 1 tablet (20 mg total) by mouth daily. 90 tablet 1   flecainide  (TAMBOCOR ) 50 MG tablet TAKE 1 TABLET BY MOUTH TWICE A DAY 180 tablet 2   fluticasone  (FLONASE ) 50 MCG/ACT nasal spray SPRAY 2 SPRAYS INTO EACH NOSTRIL EVERY DAY 48 mL 1   gabapentin  (NEURONTIN ) 800 MG tablet Take 1 tablet (800 mg total) by mouth at bedtime. 90 tablet 0   memantine  (NAMENDA ) 10 MG tablet TAKE ONE TABLET BY MOUTH TWICE A DAY 180 tablet 0   potassium chloride  SA (KLOR-CON  M20) 20 MEQ tablet Take 1 tablet (20 mEq total) by mouth daily. 90 tablet 0   tamsulosin  (FLOMAX ) 0.4 MG CAPS capsule Take 1 capsule (0.4 mg total) by mouth daily. 90 capsule 1   No current facility-administered medications for this encounter.    ROS- All systems are reviewed and negative except as per the HPI above  Physical Exam: Vitals:   05/14/23 0909  BP: (!) 118/90  Pulse: 87  Weight: 122.8 kg  Height: 6\' 4"  (1.93 m)     Filed Weights   05/14/23 0909   Weight: 122.8 kg      GEN: Well nourished, well developed in no acute distress CARDIAC: Irregularly irregular rate and rhythm, no murmurs, rubs, gallops RESPIRATORY:  Clear to auscultation without rales, wheezing or rhonchi  ABDOMEN: Soft, non-tender, non-distended EXTREMITIES:  No edema; No deformity    EKG today demonstrates Afib, PVC Vent. rate 87 BPM PR interval * ms QRS duration 114 ms QT/QTcB 402/483 ms   Echo 12/08/21  1. Left ventricular ejection fraction, by estimation, is 55 to 60%. The  left ventricle has normal function. The left ventricle has no regional  wall motion abnormalities. There is mild asymmetric left ventricular  hypertrophy of the basal-septal segment. Diastolic function indeterminant due to AFib.   2. Right ventricular systolic function is normal. The right ventricular  size is normal. Tricuspid regurgitation signal is inadequate for assessing  PA pressure.   3. Left atrial size was severely dilated.   4. Right atrial size was mildly dilated.   5. The mitral valve is normal in structure. Mild mitral valve  regurgitation.   6. The aortic valve is tricuspid. There is mild calcification of the  aortic valve. There is mild thickening of the aortic valve. Aortic valve  regurgitation is trivial. Aortic valve sclerosis/calcification is present,  without any evidence of aortic stenosis.   7. Aortic dilatation noted. There is mild dilatation of the aortic root,  measuring 41 mm. There is moderate dilatation of the ascending aorta,  measuring 47 mm. Recommend CTA for further evaluation of aorta.   8. The inferior vena cava is normal in size with greater than 50%  respiratory variability, suggesting right atrial pressure of 3 mmHg.   Comparison(s): Compared to prior TTE in 2018, patient is now in Afib and  the ascending aorta is moderately diltated (previously measured at 43mm  per my review of old study). Recommend CTA to further evaluate aorta.     CHA2DS2-VASc Score = 3  The patient's score is based upon: CHF History: 0 HTN History: 1 Diabetes History: 0 Stroke History: 0 Vascular Disease History: 1 (aortic atherosclerosis) Age Score: 1 Gender Score: 0       ASSESSMENT AND PLAN: Persistent Atrial Fibrillation (ICD10:  I48.19) The patient's CHA2DS2-VASc score is 3, indicating a 3.2% annual risk of stroke.   S/p afib ablation 2017 and 02/27/22 Patient remains in rate controlled symptomatic afib. We discussed rhythm control options. Will arrange for DCCV. If he has quick return of afib, could consider repeat ablation with PFA. Would not increase flecainide  with baseline prolonged PR, QT in SR long for dofetilide or sotalol (480s ms).  Continue flecainide  50 mg BID Continue Eliquis  5 mg BID Continue carvedilol  25 mg BID Check bmet/cbc today.   Secondary Hypercoagulable State (ICD10:  D68.69) The patient is at significant risk for stroke/thromboembolism based upon his CHA2DS2-VASc Score of 3.  Continue Apixaban  (Eliquis ).   High Risk Medication Monitoring (ICD 10: Z79.899) Intervals on ECG acceptable for flecainide  monitoring.     HTN Stable on current regimen  SVT AVNRT s/p ablation  OSA  Encouraged nightly CPAP   Follow up in the AF clinic post DCCV.    Informed Consent   Shared Decision Making/Informed Consent The risks (stroke, cardiac arrhythmias rarely resulting in the need for a temporary or permanent pacemaker, skin irritation or burns and complications associated with conscious sedation including aspiration, arrhythmia, respiratory failure and death), benefits (restoration of normal sinus rhythm) and alternatives of a direct current cardioversion were explained in detail to Mr. Sircy and he agrees to proceed.        Myrtha Ates PA-C [facility] [address] [place], [place] [address] [phone]

## 2023-05-14 NOTE — Addendum Note (Signed)
 Encounter addended by: Rich Champ, CMA on: 05/14/2023 9:51 AM  Actions taken: Clinical Note Signed

## 2023-05-14 NOTE — Patient Instructions (Addendum)
 Cardioversion scheduled for: Wednesday May 14th    - Arrive at the [employer] [facility] of [facility] ([address])  and check in with ADMITTING at 9:30am   - Do not eat or drink anything after midnight the night prior to your procedure.   - Take all your morning medication (except diabetic medications) with a sip of water prior to arrival.  - Do NOT miss any doses of your blood thinner - if you should miss a dose or take a dose more than 4 hours late -- please notify our office immediately.  - You will not be able to drive home after your procedure. Please ensure you have a responsible adult to drive you home. You will need someone with you for 24 hours post procedure.     - Expect to be in the procedural area approximately 2 hours.   - If you feel as if you go back into normal rhythm prior to scheduled cardioversion, please notify our office immediately.   If your procedure is canceled in the cardioversion suite you will be charged a cancellation fee.

## 2023-05-17 ENCOUNTER — Encounter: Payer: Medicare Other | Attending: Physical Medicine & Rehabilitation | Admitting: Physical Medicine & Rehabilitation

## 2023-05-17 ENCOUNTER — Encounter: Payer: Self-pay | Admitting: Physical Medicine & Rehabilitation

## 2023-05-17 VITALS — BP 129/88 | HR 70 | Ht 76.0 in | Wt 273.4 lb

## 2023-05-17 DIAGNOSIS — M48061 Spinal stenosis, lumbar region without neurogenic claudication: Secondary | ICD-10-CM | POA: Insufficient documentation

## 2023-05-17 DIAGNOSIS — G894 Chronic pain syndrome: Secondary | ICD-10-CM | POA: Insufficient documentation

## 2023-05-17 DIAGNOSIS — M545 Low back pain, unspecified: Secondary | ICD-10-CM | POA: Insufficient documentation

## 2023-05-17 DIAGNOSIS — G8929 Other chronic pain: Secondary | ICD-10-CM | POA: Insufficient documentation

## 2023-05-17 DIAGNOSIS — G2581 Restless legs syndrome: Secondary | ICD-10-CM | POA: Insufficient documentation

## 2023-05-17 MED ORDER — GABAPENTIN 800 MG PO TABS: 800.0000 mg | ORAL_TABLET | Freq: Every day | ORAL | 6 refills | Status: DC

## 2023-05-17 MED ORDER — BACLOFEN 10 MG PO TABS
5.0000 mg | ORAL_TABLET | Freq: Three times a day (TID) | ORAL | 0 refills | Status: DC | PRN
Start: 2023-05-17 — End: 2023-06-09

## 2023-05-17 NOTE — Progress Notes (Signed)
 Subjective:    Patient ID: Darleene Ege., male    DOB: 11-30-1950, 73 y.o.   MRN: [id number]  HPI  HPI   Ivica Risenhoover. is a 73 y.o. year old male  who  has a past medical history of Anxiety, Arthritis, Benign neoplasm of colon, Benign prostatic hyperplasia with urinary obstruction, Bilateral inguinal hernia (05/29/2011), Bilateral pulmonary embolism (HCC) (3/14), Breast lump (03/29/2019), Calculus of kidney (10/21/2016), Cataract, Chronic bilateral low back pain with sciatica (03/21/2012), Chronic pain, Finger mass, right (10/17/2020), History of alcohol abuse (03/07/2016), History of colon polyps (11/18/2015), History of kidney stones, History of pulmonary embolism (03/19/2012), Hyperlipemia, Hypertension, Insomnia, Insomnia due to medical condition (12/23/2016), Major depressive disorder, recurrent episode (HCC), Mitral regurgitation (04/24/2013), Morbid obesity (HCC) (03/07/2016), Nonrheumatic mitral valve insufficiency (04/25/2013), Obesity, OSA (obstructive sleep apnea), Osteoporosis, Persistent atrial fibrillation (HCC), Recurrent unilateral inguinal hernia (07/17/2013), Restless leg syndrome, S/P right total knee arthroplasty (05/24/2018), Sciatica, Spinal stenosis, lumbar region with neurogenic claudication, SVT (supraventricular tachycardia) (HCC) (03/07/2016), Thrombus of left atrial appendage (03/09/2013), Uncomplicated asthma (06/20/2008), and Ventral hernia, unspecified, without mention of obstruction or gangrene.   They are presenting to PM&R clinic as a new patient for pain management evaluation. They were referred by Dr. Rochelle Chu for treatment of lumbar spinal stenosis pain.  Mr. Mcburnie reports progressive worsening pain in his lower back that radiates to his outer and inner thighs.  He says this pain has been worsening gradually over many years.  Pain is worsened with standing up and walking.  Pain has a squeezing quality and he says the pain worsens with each step when he is walking.  Pain is improved  with bending forward or sitting down.  He reports having a history of scoliosis.  He was previously followed by [facility].  Patient was also seen for several years by Dr. Daisey Dryer.  He had a trial of spinal cord stimulator but appears it was not beneficial enough to where he wanted to have it implanted.    Patient reports multiple spinal injections by Dr. Daisey Dryer and he thinks that injections that he had reduced his pain from several weeks.  He is not sure which injections ESI versus facet joint ablation provided him with the best relief.   Chart review indicates he had radiofrequency ablation of left L3-4, L4-5 and L5-S1 facet joints by Dr. Daisey Dryer after having a double diagnostic medial branch block with more than 50% relief.  It does not appear he had a lot of relief from the RFA.   More recently he was seen at [facility] neurosurgery.  Patient reports neurosurgery suggested he do injections again and he wanted to do them in [place] versus doing them at [facility] due to distance.  Chart review indicates neurosurgery feels that possibly upper lumbar facet joints could be contributing to his pain and they suggested upper lumbar MBB and possible RFA.   Patient reports having leg length discrepancy and has a lift in his left shoe.   Patient reports he tries to stay active and tries to exercise in a pool when he can.  He used to like to swim when he was younger.   He uses a cane for balance but does not want to use a walker.   Red flag symptoms: No red flags for back pain endorsed in Hx or ROS   Medications tried: Topical medications denies , tried CBD ointment in the past  Nsaids - limited by Afib  Tylenol   - Helps a little  Opiates  Hydrocodone  and oxycodone - help a little, helps him.  He would like to use this only intermittently when his pain is very severe during acute exacerbations Gabapentin  /- Takes TID 800mg   TCAs  -denies  SNRIs  -does not recall   Other treatments: PT- helps while he is  getting it but results havent lasted  TENs unit -denies benefit Injections -ESI, RFA, spinal cord stimulator trial in past by Dr. Daisey Dryer  Surgery denies prior spine surgery   Goals for pain management: To be more active and have decreased pain     Pain is going down his left leg to his knee    Prior UDS results:  Labs (Brief)          Component Value Date/Time    LABOPIA NEGATIVE 03/17/2012 0133    COCAINSCRNUR NEGATIVE 03/17/2012 0133    LABBENZ POSITIVE (A) 03/17/2012 0133    AMPHETMU NEGATIVE 03/17/2012 0133      Interval history 11/12/2022 Mr. Gruenewald is here for follow-up regarding his chronic low back pain.  He had bilateral T12, L1, L2 medial branch blocks completed on 10/27/2022 by Dr. Sharl Davies.  Patient reports he did not have significant benefit after his procedure.  Patient reports he tried to go to aquatic therapy, however patient had incident with staff and left without being seen.  Pain overall unchanged since last visit.  He has not tried Lyrica  yet, has not picked up from the pharmacy.   Interval history 12/15/22 Patient is here for follow-up regarding his chronic back pain.  He continues to have significant back pain although he feels like it is improved since starting Lyrica .  Reports he is tolerating this medication well.  He continues to have pain during activities.  He is open to retrying aquatic therapy.  His activity continues to be limited, has a hard time walking into stores from the parking lot when he needs to go shopping or pick up something from the house.. Reports occasional dizziness when for standing up, this improves after a few steps.   Interval history 02/15/23 Mr. Bobak is here for follow-up regarding his chronic back pain.  He reports that therapy is helping a lot and he feels like his function is much improved.  He is very happy with his treatment at [facility].  He continues to work with therapy. Patient reports his PCP has changed his medication back to  gabapentin  from Lyrica .  He feels like there was not much difference in his pain control and the gabapentin  was helping his RLS better.    Interval history 05/17/23 Mr. Bare is here for follow-up regarding his chronic back pain.  Reports aquatic therapy is helpful and he enjoyed working at [employer].  Back pain overall is doing better.  He has not yet started exercising in the pool on his own but would like to do so.  Patient recently moved to a new home and has been more busy due to this.  He also has treatment for A-fib scheduled.  Reports baclofen  in the past was beneficial for his back pain and did not cause any significant side effects.   Pain Inventory Average Pain 6 Pain Right Now 4 My pain is stabbing  In the last 24 hours, has pain interfered with the following? General activity 4 Relation with others 4 Enjoyment of life 4 What TIME of day is your pain at its worst? evening Sleep (in general) NA  Pain is worse with: walking Pain improves with: na Relief  from Meds: NA  Family History  Problem Relation Age of Onset   Cancer Father        bone   Heart failure Mother    Hypertension Mother    Asthma Mother    Cancer Paternal Grandmother        ovarian   CVA Other        Fam Hx of multiple myeloma   Diabetes Other        Fam Hx of DM   Social History   Socioeconomic History   Marital status: Married    Spouse name: Not on file   Number of children: 2   Years of education: college   Highest education level: Bachelor's degree (e.g., BA, AB, BS)  Occupational History   Occupation: RETIRED  Tobacco Use   Smoking status: Former    Current packs/day: 0.00    Average packs/day: 0.5 packs/day for 5.0 years (2.5 ttl pk-yrs)    Types: Cigarettes    Start date: 45    Quit date: 1976    Years since quitting: 49.3   Smokeless tobacco: Never   Tobacco comments:    Former smoker 03/27/22  Vaping Use   Vaping status: Never Used  Substance and Sexual Activity   Alcohol  use: No    Comment: Former EtOH abuse, stopped 06/2016   Drug use: Yes    Types: Marijuana    Comment: negative hx for IV drug abuse   Sexual activity: Not on file  Other Topics Concern   Not on file  Social History Narrative   Lives with wife in [place]   Retired Investment banker, operational   Wife lives in town (still married but lives separate)   Has biological daughter in [place] (Adriana Hopping Bott)   Social Drivers of Health   Financial Resource Strain: Low Risk  (04/02/2023)   Overall Financial Resource Strain (CARDIA)    Difficulty of Paying Living Expenses: Not hard at all  Food Insecurity: No Food Insecurity (04/02/2023)   Hunger Vital Sign    Worried About Running Out of Food in the Last Year: Never true    Ran Out of Food in the Last Year: Never true  Transportation Needs: No Transportation Needs (04/02/2023)   PRAPARE - Administrator, Civil Service (Medical): No    Lack of Transportation (Non-Medical): No  Physical Activity: Inactive (04/02/2023)   Exercise Vital Sign    Days of Exercise per Week: 0 days    Minutes of Exercise per Session: 0 min  Stress: No Stress Concern Present (04/02/2023)   [employer] of Occupational Health - Occupational Stress Questionnaire    Feeling of Stress : Not at all  Recent Concern: Stress - Stress Concern Present (01/14/2023)   [employer] of Occupational Health - Occupational Stress Questionnaire    Feeling of Stress : Rather much  Social Connections: Socially Isolated (04/02/2023)   Social Connection and Isolation Panel [NHANES]    Frequency of Communication with Friends and Family: Never    Frequency of Social Gatherings with Friends and Family: Never    Attends Religious Services: Never    Database administrator or Organizations: No    Attends Banker Meetings: Never    Marital Status: Separated   Past Surgical History:  Procedure Laterality Date   ATRIAL FIBRILLATION ABLATION N/A 02/27/2022   Procedure:  ATRIAL FIBRILLATION ABLATION;  Surgeon: Arlester Ladd, Donnamae Gaba, MD;  [facility] INVAS[facility] LAB;  Service: Cardiovascular;  Laterality: N/A;   CARDIOVERSION N/A 03/21/2012   Procedure: CARDIOVERSION;  Surgeon: Darrold Emms, MD;  Location: [facility] ENDOSCOPY;  Service: Cardiovascular;  Laterality: N/A;   CARDIOVERSION N/A 04/24/2013   Procedure: CARDIOVERSION;  Surgeon: Liza Riggers, MD;  Location: [facility] ENDOSCOPY;  Service: Cardiovascular;  Laterality: N/A;   CARDIOVERSION N/A 06/27/2015   Procedure: CARDIOVERSION;  Surgeon: Darlis Eisenmenger, MD;  Location: [facility] ENDOSCOPY;  Service: Cardiovascular;  Laterality: N/A;   CARDIOVERSION N/A 11/11/2021   Procedure: CARDIOVERSION;  Surgeon: Lenise Quince, MD;  Location: [facility] ENDOSCOPY;  Service: Cardiovascular;  Laterality: N/A;   CARDIOVERSION N/A 05/11/2022   Procedure: CARDIOVERSION;  Surgeon: Jann Melody, MD;  [facility];  Service: Cardiovascular;  Laterality: N/A;   CARDIOVERSION N/A 07/13/2022   Procedure: CARDIOVERSION;  Surgeon: Luana Rumple, MD;  [facility];  Service: Cardiovascular;  Laterality: N/A;   CATARACT EXTRACTION     COLONOSCOPY W/ POLYPECTOMY     ELECTROPHYSIOLOGIC STUDY N/A 07/30/2015   Procedure: Atrial Fibrillation Ablation;  Surgeon: Jolly Needle, MD;  Location: [facility] INVASIVE CV LAB;  Service: Cardiovascular;  Laterality: N/A;   EXTRACORPOREAL SHOCK WAVE LITHOTRIPSY Right 10/29/2016   Procedure: RIGHT EXTRACORPOREAL SHOCK WAVE LITHOTRIPSY (ESWL);  Surgeon: Osborn Blaze, MD;  Location: [facility] ORS;  Service: Urology;  Laterality: Right;   EYE SURGERY Right    cataract   FEMUR FRACTURE SURGERY     HERNIA REPAIR  07/06/2011   INGUINAL HERNIA REPAIR Right 07/28/2013   Procedure: RIGHT INGUINAL HERNIA REPAIR;  Surgeon: Kari Otto. Eli Grizzle, MD;  Location: [facility] OR;  Service: General;  Laterality: Right;   INGUINAL HERNIA REPAIR Right 09/22/2016   Procedure: LAPAROSCOPIC RIGHT INGUINAL HERNIA;  Surgeon:  Dorrie Gaudier Alphonso Aschoff, MD;  Location: [facility] ORS;  Service: General;  Laterality: Right;  With MESH   INSERTION OF MESH Right 07/28/2013   Procedure: INSERTION OF MESH;  Surgeon: Kari Otto. Eli Grizzle, MD;  Location: [facility] OR;  Service: General;  Laterality: Right;   KNEE ARTHROSCOPY     left   REPLACEMENT TOTAL KNEE Left    ROTATOR CUFF REPAIR     right   ROTATOR CUFF REPAIR Left 03/08/2014   DR SUPPLE   SHOULDER ARTHROSCOPY WITH ROTATOR CUFF REPAIR AND SUBACROMIAL DECOMPRESSION Left 03/08/2014   Procedure: LEFT SHOULDER ARTHROSCOPY WITH ROTATOR CUFF REPAIR/SUBACROMIAL DECOMPRESSION/DISTAL CLAVICLE RESECTION;  Surgeon: Glo Larch, MD;  Location: [facility];  Service: Orthopedics;  Laterality: Left;   SVT ABLATION N/A 01/23/2019   Procedure: SVT ABLATION;  Surgeon: Tammie Fall, MD;  Location: [facility] INVASIVE CV LAB;  Service: Cardiovascular;  Laterality: N/A;   TEE WITHOUT CARDIOVERSION N/A 03/21/2012   Procedure: TRANSESOPHAGEAL ECHOCARDIOGRAM (TEE);  Surgeon: Darrold Emms, MD;  Location: [facility] ENDOSCOPY;  Service: Cardiovascular;  Laterality: N/A;   TEE WITHOUT CARDIOVERSION N/A 04/24/2013   Procedure: TRANSESOPHAGEAL ECHOCARDIOGRAM (TEE);  Surgeon: Liza Riggers, MD;  Location: [facility] ENDOSCOPY;  Service: Cardiovascular;  Laterality: N/A;   TEE WITHOUT CARDIOVERSION N/A 07/29/2015   Procedure: TRANSESOPHAGEAL ECHOCARDIOGRAM (TEE);  Surgeon: Elmyra Haggard, MD;  Location: [facility] ENDOSCOPY;  Service: Cardiovascular;  Laterality: N/A;   TEE WITHOUT CARDIOVERSION N/A 05/11/2022   Procedure: TRANSESOPHAGEAL ECHOCARDIOGRAM;  Surgeon: Jann Melody, MD;  [facility];  Service: Cardiovascular;  Laterality: N/A;   TONSILLECTOMY     TOTAL KNEE ARTHROPLASTY Right 05/24/2018   Procedure: RIGHT TOTAL KNEE ARTHROPLASTY;  Surgeon: Claiborne Crew, MD;  [facility]: [facility];  Service: Orthopedics;  Laterality: Right;  70 mins   Past Surgical History:  Procedure Laterality Date   ATRIAL FIBRILLATION ABLATION  N/A 02/27/2022   Procedure: ATRIAL FIBRILLATION ABLATION;  Surgeon: Efraim Grange, MD;  [facility];  Service: Cardiovascular;  Laterality: N/A;   CARDIOVERSION N/A 03/21/2012   Procedure: CARDIOVERSION;  Surgeon: Darrold Emms, MD;  [facility] ENDOS[facility];  Service: Cardiovascular;  Laterality: N/A;   CARDIOVERSION N/A 04/24/2013   Procedure: CARDIOVERSION;  Surgeon: Liza Riggers, MD;  Location: [facility] ENDOSCOPY;  Service: Cardiovascular;  Laterality: N/A;   CARDIOVERSION N/A 06/27/2015   Procedure: CARDIOVERSION;  Surgeon: Darlis Eisenmenger, MD;  Location: [facility] ENDOSCOPY;  Service: Cardiovascular;  Laterality: N/A;   CARDIOVERSION N/A 11/11/2021   Procedure: CARDIOVERSION;  Surgeon: Lenise Quince, MD;  Location: [facility] ENDOSCOPY;  Service: Cardiovascular;  Laterality: N/A;   CARDIOVERSION N/A 05/11/2022   Procedure: CARDIOVERSION;  Surgeon: Jann Melody, MD;  [facility];  Service: Cardiovascular;  Laterality: N/A;   CARDIOVERSION N/A 07/13/2022   Procedure: CARDIOVERSION;  Surgeon: Luana Rumple, MD;  [facility];  Service: Cardiovascular;  Laterality: N/A;   CATARACT EXTRACTION     COLONOSCOPY W/ POLYPECTOMY     ELECTROPHYSIOLOGIC STUDY N/A 07/30/2015   Procedure: Atrial Fibrillation Ablation;  Surgeon: Jolly Needle, MD;  Location: [facility] INVASIVE CV LAB;  Service: Cardiovascular;  Laterality: N/A;   EXTRACORPOREAL SHOCK WAVE LITHOTRIPSY Right 10/29/2016   Procedure: RIGHT EXTRACORPOREAL SHOCK WAVE LITHOTRIPSY (ESWL);  Surgeon: Osborn Blaze, MD;  Location: [facility];  Service: Urology;  Laterality: Right;   EYE SURGERY Right    cataract   FEMUR FRACTURE SURGERY     HERNIA REPAIR  07/06/2011   INGUINAL HERNIA REPAIR Right 07/28/2013   Procedure: RIGHT INGUINAL HERNIA REPAIR;  Surgeon: Kari Otto. Eli Grizzle, MD;  [facility];  Service: General;  Laterality: Right;   INGUINAL HERNIA REPAIR Right 09/22/2016   Procedure: LAPAROSCOPIC RIGHT  INGUINAL HERNIA;  Surgeon: Dorrie Gaudier Alphonso Aschoff, MD;  Location: [facility];  Service: General;  Laterality: Right;  With MESH   INSERTION OF MESH Right 07/28/2013   Procedure: INSERTION OF MESH;  Surgeon: Kari Otto. Eli Grizzle, MD;  [facility];  Service: General;  Laterality: Right;   KNEE ARTHROSCOPY     left   REPLACEMENT TOTAL KNEE Left    ROTATOR CUFF REPAIR     right   ROTATOR CUFF REPAIR Left 03/08/2014   DR SUPPLE   SHOULDER ARTHROSCOPY WITH ROTATOR CUFF REPAIR AND SUBACROMIAL DECOMPRESSION Left 03/08/2014   Procedure: LEFT SHOULDER ARTHROSCOPY WITH ROTATOR CUFF REPAIR/SUBACROMIAL DECOMPRESSION/DISTAL CLAVICLE RESECTION;  Surgeon: Glo Larch, MD;  [facility];  Service: Orthopedics;  Laterality: Left;   SVT ABLATION N/A 01/23/2019   Procedure: SVT ABLATION;  Surgeon: Tammie Fall, MD;  Location: [facility] INVASIVE CV LAB;  Service: Cardiovascular;  Laterality: N/A;   TEE WITHOUT CARDIOVERSION N/A 03/21/2012   Procedure: TRANSESOPHAGEAL ECHOCARDIOGRAM (TEE);  Surgeon: Darrold Emms, MD;  Location: [facility] ENDOSCOPY;  Service: Cardiovascular;  Laterality: N/A;   TEE WITHOUT CARDIOVERSION N/A 04/24/2013   Procedure: TRANSESOPHAGEAL ECHOCARDIOGRAM (TEE);  Surgeon: Liza Riggers, MD;  Location: [facility] ENDOSCOPY;  Service: Cardiovascular;  Laterality: N/A;   TEE WITHOUT CARDIOVERSION N/A 07/29/2015   Procedure: TRANSESOPHAGEAL ECHOCARDIOGRAM (TEE);  Surgeon: Elmyra Haggard, MD;  Location: [facility] ENDOSCOPY;  Service: Cardiovascular;  Laterality: N/A;   TEE WITHOUT CARDIOVERSION N/A 05/11/2022   Procedure: TRANSESOPHAGEAL ECHOCARDIOGRAM;  Surgeon:  Jann Melody, MD;  [facility];  Service: Cardiovascular;  Laterality: N/A;   TONSILLECTOMY     TOTAL KNEE ARTHROPLASTY Right 05/24/2018   Procedure: RIGHT TOTAL KNEE ARTHROPLASTY;  Surgeon: Claiborne Crew, MD;  Location: [facility];  Service: Orthopedics;  Laterality: Right;  70 mins   Past Medical History:  Diagnosis Date   Anxiety     Arthritis    Benign neoplasm of colon    Benign prostatic hyperplasia with urinary obstruction    Bilateral inguinal hernia 05/29/2011   Bilateral pulmonary embolism (HCC) 3/14   admitted to [facility],  treated with Xarelto    Breast lump 03/29/2019   Calculus of kidney 10/21/2016   Cataract    Chronic bilateral low back pain with sciatica 03/21/2012   Chronic pain    Finger mass, right 10/17/2020   History of alcohol abuse 03/07/2016   History of colon polyps 11/18/2015   History of kidney stones    History of pulmonary embolism 03/19/2012   Hyperlipemia    Hypertension    Insomnia    Insomnia due to medical condition 12/23/2016   Polyuria secondary to BPH   Major depressive disorder, recurrent episode (HCC)    Mitral regurgitation 04/24/2013   Mild by TEE   Morbid obesity (HCC) 03/07/2016   Nonrheumatic mitral valve insufficiency 04/25/2013   Mild by TEE  Overview:  Overview:  Mild by TEE Overview:  Overview:  Overview:  Mild by TEE Overview:  Overview:  Mild by TEE   Obesity    OSA (obstructive sleep apnea)    noncompliant with CPAP.  07/27/13- awaiting a CPAP- unable to tolerate mask   Osteoporosis    Persistent atrial fibrillation (HCC)    Recurrent unilateral inguinal hernia 07/17/2013   Restless leg syndrome    takes gabapentin    S/P right total knee arthroplasty 05/24/2018   Sciatica    Spinal stenosis, lumbar region with neurogenic claudication    SVT (supraventricular tachycardia) (HCC) 03/07/2016   Thrombus of left atrial appendage 03/09/2013   Uncomplicated asthma 06/20/2008   Overview:  Overview:  Overview:  Overview:  Qualifier: Diagnosis of  By: Ena Harries MD, Viktoria Gray Overview:  Overview:  Qualifier: Diagnosis of  By: Ena Harries MD, Viktoria Gray   Ventral hernia, unspecified, without mention of obstruction or gangrene    right abdominal wall   BP 129/88   Pulse 70   Ht 6\' 4"  (1.93 m)   Wt 273 lb 6.4 oz (124 kg)   SpO2 93%   BMI 33.28 kg/m   Opioid Risk Score:   Fall Risk  Score:  `1  Depression screen [facility] 2/9     05/17/2023    9:14 AM 05/04/2023    9:44 AM 04/02/2023    9:57 AM 02/15/2023   10:35 AM 12/15/2022    9:48 AM 11/12/2022    9:47 AM 10/29/2022   10:21 AM  Depression screen PHQ 2/9  Decreased Interest 3 3 3  0 0 1   Down, Depressed, Hopeless 3 3 3  0 0 1 1  PHQ - 2 Score 6 6 6  0 0 2 1  Altered sleeping  3 1    1   Tired, decreased energy  3 0      Change in appetite  2 0      Feeling bad or failure about yourself   2 0      Trouble concentrating  2 0      Moving slowly or fidgety/restless  1 0      Suicidal thoughts  1 0      PHQ-9 Score  20 7    2   Difficult doing work/chores  Extremely dIfficult           Review of Systems  Musculoskeletal:  Positive for back pain.  All other systems reviewed and are negative.      Objective:   Physical Exam     05/17/2023    9:13 AM 05/14/2023    9:09 AM 05/04/2023    9:42 AM  Vitals with BMI  Height 6\' 4"  6\' 4"  6\' 4"   Weight 273 lbs 6 oz 270 lbs 13 oz 270 lbs  BMI 33.29 32.98 32.88  Systolic 129 118 161  Diastolic 88 90 76  Pulse 70 87 62    Gen: no distress, normal appearing HEENT: oral mucosa pink and moist Chest: normal effort, normal rate of breathing Abd: soft, non-distended Ext: no edema Psych: Appropriate, pleasant Skin: intact Neuro: Alert and oriented, follows commands, cranial nerves II through XII grossly intact, normal speech and language Moving all 4 extremities to gravity and resistance Sensory exam normal for light touch and pain in all 4 limbs.  No ankle clonus    Musculoskeletal:  Minimal tenderness to palpation about bilateral paraspinal muscles in the lumbar spine Kyphotic posture SLR negative Facet loading positive and  pain with spinal extension-not checked today   MRI from [facility] note 05/14/22 Lumbar MRI 05/13/22: Anatomical variants: none Alignment: Degenerative levoscoliosis. Conus medullaris: terminates at approximately L1. Spinal Cord and Cauda  Equina: Visualized portions of the spinal cord is normal in morphology and signal. Normal appearance of the cauda equina.  Bone marrow signal: No suspicious lesions.  T12-L1: Mild disc and facet degenerative changes resulting in mild spinal canal stenosis.  L1-L2: Unremarkable.  L2-L3: Mild disc and facet degenerative changes without significant spinal canal or neural foraminal stenosis.  L3-L4: Disc bulge and facet arthropathy resulting in mild canal stenosis.  L4-L5: Mild disc and facet degenerative changes resulting in mild canal stenosis.  L5-S1: Advanced bilateral facet arthropathy and mild disc degenerative changes resulting in mild left neural foraminal stenosis.  IMPRESSION: Multilevel disc and facet degenerative changes above without high-grade stenosis.    L spine Xray from [facility] note  Spine x-rays 04/01/22: IMPRESSION: 1. S-shaped scoliosis of the thoracolumbar spine with positive sagittal and coronal imbalance. 2. Mild stepwise retrolisthesis from L2 to L5 without evidence of dynamic instability. 3. 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       Assessment & Plan:   1) Chronic low back pain with degenerative spinal canal stenosis and multilevel lumbar facet joint degeneration   2) Depression, denies SI or HI. On Lexapro  20 mg daily  3) Dizziness when getting up intermittently.  Suspect orthostatic hypotension  4)Marijuanna use  -Has discontinued     -Patient was previously started on Lyrica  100 mg 3 times daily.  PCP has restarted gabapentin , he feels like it was better for his RLS.  He did not notice a significant pain improvement with the Lyrica  over gabapentin .   -Continue current dose of gabapentin - ordered -Initially plan to refer to Dr. Sharl Davies for [facility] as patient indicated this was helpful in the past.  On chart review it appears there is some degeneration in upper facet joints of the lumbar spine, will discuss  possibility of medial branch block/RFA to upper lumbar spine.  Lower lumbar spine RFA was completed previously  by Dr. Daisey Dryer.  Patient was referred for medial branch block of upper lumbar spine.  Medial branch block bilateral T12, L1-L2 and this was completed on 10/24 without significant benefit reported. -Prior visit I placed referral to psychiatry for medication management due to continued depression -Completed aquatic therapy-[facility]  was helpful  -Advised to continue exercise in pool- provided letter indicating he would benefit from this -Compression stockings to prevent orthostatic hypotension symptoms - Previously was considering tramadol  THC noted on UDS.  Patient reports he has discontinued use of the substance.  Currently his pain is doing better and we will hold off on additional medications. -Start baclofen  5-10mg  TID PRN

## 2023-05-18 ENCOUNTER — Ambulatory Visit (HOSPITAL_COMMUNITY)
Admission: RE | Admit: 2023-05-18 | Discharge: 2023-05-18 | Disposition: A | Discharge status: Home / Self Care | Source: Ambulatory Visit | Attending: Physician Assistant | Admitting: Physician Assistant

## 2023-05-18 DIAGNOSIS — I4891 Unspecified atrial fibrillation: Secondary | ICD-10-CM | POA: Insufficient documentation

## 2023-05-18 NOTE — Progress Notes (Signed)
 Spoke with  patient, after calling back to cath lab holding, Procedure scheduled for 1030, Please arrive at the hospital at  [phone]   , NPO after midnight on 05/18/23 , May take meds with sips of water in the AM, please have transportation for home post procedure, and someone to stay with pt for approximately 24 hours after  Pt states he is on eliquis  and has been taking regularly with no missed doses

## 2023-05-18 NOTE — Progress Notes (Signed)
 Spoke to patient's daughter, Margene Sheen and instructed them to come at 0830  and to be NPO after 0000.  Medications reviewed.    Confirmed that patient will have a ride home and someone to stay with them for 24 hours after the procedure.

## 2023-05-18 NOTE — Progress Notes (Signed)
 Patient presents for ECG prior to DCCV tomorrow. ECG shows:  Afib, PVCs Vent. rate 79 BPM PR interval * ms QRS duration 112 ms QT/QTcB 410/470 ms  Will proceed with DCCV and follow up as scheduled.

## 2023-05-19 ENCOUNTER — Encounter (HOSPITAL_COMMUNITY)
Admission: RE | Disposition: A | Discharge status: Home / Self Care | Payer: Self-pay | Source: Home / Self Care | Attending: Cardiology

## 2023-05-19 ENCOUNTER — Encounter (HOSPITAL_COMMUNITY): Payer: Self-pay | Admitting: Cardiology

## 2023-05-19 ENCOUNTER — Ambulatory Visit (HOSPITAL_COMMUNITY)
Admission: RE | Admit: 2023-05-19 | Discharge: 2023-05-19 | Disposition: A | Discharge status: Home / Self Care | Attending: Cardiology | Admitting: Cardiology

## 2023-05-19 ENCOUNTER — Ambulatory Visit (HOSPITAL_COMMUNITY): Payer: Self-pay

## 2023-05-19 ENCOUNTER — Other Ambulatory Visit: Payer: Self-pay

## 2023-05-19 DIAGNOSIS — Z7901 Long term (current) use of anticoagulants: Secondary | ICD-10-CM | POA: Diagnosis not present

## 2023-05-19 DIAGNOSIS — D6869 Other thrombophilia: Secondary | ICD-10-CM | POA: Diagnosis not present

## 2023-05-19 DIAGNOSIS — I10 Essential (primary) hypertension: Secondary | ICD-10-CM | POA: Insufficient documentation

## 2023-05-19 DIAGNOSIS — I4819 Other persistent atrial fibrillation: Secondary | ICD-10-CM | POA: Diagnosis not present

## 2023-05-19 DIAGNOSIS — I4891 Unspecified atrial fibrillation: Secondary | ICD-10-CM

## 2023-05-19 DIAGNOSIS — I4719 Other supraventricular tachycardia: Secondary | ICD-10-CM | POA: Diagnosis not present

## 2023-05-19 DIAGNOSIS — G4733 Obstructive sleep apnea (adult) (pediatric): Secondary | ICD-10-CM | POA: Insufficient documentation

## 2023-05-19 DIAGNOSIS — Z79899 Other long term (current) drug therapy: Secondary | ICD-10-CM | POA: Diagnosis not present

## 2023-05-19 DIAGNOSIS — Z539 Procedure and treatment not carried out, unspecified reason: Secondary | ICD-10-CM | POA: Diagnosis not present

## 2023-05-19 SURGERY — CARDIOVERSION (CATH LAB)
Anesthesia: Monitor Anesthesia Care

## 2023-05-19 MED ORDER — HYDRALAZINE HCL 20 MG/ML IJ SOLN
INTRAMUSCULAR | Status: DC | PRN
  Administered 2023-05-19 (×2): 10 mg via INTRAVENOUS

## 2023-05-19 MED ORDER — LABETALOL HCL 5 MG/ML IV SOLN
INTRAVENOUS | Status: DC | PRN
  Administered 2023-05-19 (×2): 10 mg via INTRAVENOUS

## 2023-05-19 MED ORDER — SODIUM CHLORIDE 0.9% FLUSH: 3.0000 mL | Freq: Two times a day (BID) | INTRAVENOUS | Status: DC

## 2023-05-19 MED ORDER — SODIUM CHLORIDE 0.9% FLUSH
3.0000 mL | INTRAVENOUS | Status: DC | PRN
Start: 2023-05-19 — End: 2023-05-19

## 2023-05-19 NOTE — H&P (Signed)
 Brief cardiology note:  Patient consented for procedure, all questions answered. Patient's diastolic blood pressure was ranging 106-120s, anesthesia requested medical management of this prior to sedation. Patient was given IV labetalol  x2 and IV hydralazine  x2. He notes that he was told not to take his AM carvedilol  dose prior to the procedure.  After second dose of IV hydralazine , patient became agitated and tearful, sat up and demanded to leave, attempted to pull out IV and pull off leads. He stated that he did not wish to be rescheduled for his procedure.   Procedure was cancelled. Ordering provider will be notified.  Sheryle Donning, MD, PhD, [facility]  [facility]  [place]  Heart & Vascular at [facility] at [facility] [address] [place], [place] [address] [phone]

## 2023-05-19 NOTE — Progress Notes (Signed)
 Pt to be discharged AMA per Dr Veryl Gottron. PIV removed; pt walked out alongside Warden/ranger, received by discharge transport [employer].

## 2023-05-19 NOTE — Anesthesia Preprocedure Evaluation (Addendum)
 Anesthesia Evaluation  Patient identified by MRN, date of birth, ID band Patient awake    Reviewed: Allergy & Precautions, NPO status , Patient's Chart, lab work & pertinent test results, reviewed documented beta blocker date and time   History of Anesthesia Complications Negative for: history of anesthetic complications  Airway Mallampati: II  TM Distance: >3 FB Neck ROM: Full    Dental  (+) Dental Advisory Given   Pulmonary sleep apnea (non-compliant with CPAP) , former smoker   breath sounds clear to auscultation       Cardiovascular hypertension, Pt. on medications and Pt. on home beta blockers (-) angina + dysrhythmias Atrial Fibrillation  Rhythm:Irregular Rate:Normal  '24 ECHO: EF 55 to 60%.  1. The LV has normal function.   2. RVF is normal. The right ventricular size is normal.   3. Left atrial size was severely dilated. No left atrial/left atrial appendage thrombus was detected.   4. Right atrial size was mildly dilated.   5. The mitral valve is abnormal. Moderate mitral valve regurgitation.   6. The aortic valve is tricuspid. There is moderate thickening of the aortic valve. Aortic valve regurgitation is trivial.   7. Aortic dilatation noted. There is mild dilatation of the aortic root, 43 mm. There is mild (Grade II) plaque involving the descending aorta.     Neuro/Psych   Anxiety Depression    negative neurological ROS     GI/Hepatic negative GI ROS, Neg liver ROS,,,  Endo/Other  BMI 33  Renal/GU stones     Musculoskeletal  (+) Arthritis ,    Abdominal   Peds  Hematology eliquis    Anesthesia Other Findings   Reproductive/Obstetrics                             Anesthesia Physical Anesthesia Plan  ASA: 3  Anesthesia Plan: General   Post-op Pain Management: Minimal or no pain anticipated   Induction: Intravenous  PONV Risk Score and Plan: 2 and Treatment may vary due to  age or medical condition  Airway Management Planned: Nasal Cannula and Natural Airway  Additional Equipment: None  Intra-op Plan:   Post-operative Plan:   Informed Consent: I have reviewed the patients History and Physical, chart, labs and discussed the procedure including the risks, benefits and alternatives for the proposed anesthesia with the patient or authorized representative who has indicated his/her understanding and acceptance.     Dental advisory given  Plan Discussed with: CRNA and Surgeon  Anesthesia Plan Comments: (Discussed with Dr. Veryl Gottron, diastolic > 100 mmHg before proceed)        Anesthesia Quick Evaluation

## 2023-05-19 NOTE — Interval H&P Note (Signed)
 History and Physical Interval Note:  05/19/2023 10:12 AM  Brandon B Stangeland Jr.  has presented today for surgery, with the diagnosis of AFIB.  The various methods of treatment have been discussed with the patient and family. After consideration of risks, benefits and other options for treatment, the patient has consented to  Procedure(s): CARDIOVERSION (N/A) as a surgical intervention.  The patient's history has been reviewed, patient examined, no change in status, stable for surgery.  I have reviewed the patient's chart and labs.  Questions were answered to the patient's satisfaction.     Jolinda Pinkstaff Veryl Gottron

## 2023-05-20 ENCOUNTER — Telehealth: Payer: Self-pay | Admitting: Pharmacist

## 2023-05-20 ENCOUNTER — Other Ambulatory Visit

## 2023-05-20 NOTE — Telephone Encounter (Signed)
 Called patient for pharmacist appointment, pt was referred for Afib by PCP. Was able to reach patient however he was irritable due to an unpleasant experience yesterday at his scheduled cardioversion, which was not completed due to elevated blood pressure. Pt noted he no longer wanted to see [facility] providers therefore I asked if he wanted to proceed with the appointment to which he responded he did not know what the appointment was even for. I explained PCP wanted me to follow up on his afib medications. Pt continued to be uncooperative in response to my questions/comments therefore the appointment was ended.  Rainelle Bur, PharmD, BCPS, CPP Clinical Pharmacist Practitioner [facility] at [facility] [phone]

## 2023-05-21 ENCOUNTER — Encounter: Payer: Self-pay | Admitting: Internal Medicine

## 2023-05-26 ENCOUNTER — Ambulatory Visit (HOSPITAL_COMMUNITY)
Admission: RE | Admit: 2023-05-26 | Discharge: 2023-05-26 | Disposition: A | Discharge status: Home / Self Care | Source: Ambulatory Visit | Attending: Physician Assistant | Admitting: Physician Assistant

## 2023-05-26 ENCOUNTER — Encounter (HOSPITAL_COMMUNITY): Payer: Self-pay | Admitting: Physician Assistant

## 2023-05-26 VITALS — BP 138/98 | HR 69 | Ht 76.0 in | Wt 276.6 lb

## 2023-05-26 DIAGNOSIS — I4819 Other persistent atrial fibrillation: Secondary | ICD-10-CM | POA: Diagnosis not present

## 2023-05-26 DIAGNOSIS — I480 Paroxysmal atrial fibrillation: Secondary | ICD-10-CM | POA: Diagnosis not present

## 2023-05-26 DIAGNOSIS — D6869 Other thrombophilia: Secondary | ICD-10-CM | POA: Insufficient documentation

## 2023-05-26 NOTE — Addendum Note (Signed)
 Encounter addended by: Tess Fife, RN on: 05/26/2023 9:23 AM  Actions taken: Order list changed, Diagnosis association updated

## 2023-05-26 NOTE — Progress Notes (Signed)
 Primary Care Physician: Arcadio Knuckles, MD Referring Physician: Dr. Nunzio Belch Primary EP: Dr Arlester Ladd   Brandon [name] [name]. is a 73 y.o. male with a h/o afib, s/p ablation, maintaining SR, off amiodarone . He had one episode of afib in July, none since then He has gained 20 lbs since Covid inactivity. He had rt knee replacement in May.  He recently had 2 separet ER visits for SVT with RVR and very hypotensive. The first time in ER, he was given adenosine  and broke but with return of SVT, he was cardioverted. He went home only to have  the same scenario to repeat again the next day. This time he was kept and treated. These episodes  may have been set off by pt not having his coreg  25 mg bid for a week as he was waiting for refills. He was given amiodarone  IV but when he returned to SR, this was stopped and he was gotten back on his BB. Pt states that Dr. Carolynne Citron wanted to see him after discharge  to  discuss SVT ablation. CHA2DS2VASc of 2, on eliquis .  F/u afib clinic, 06/14/19. He had an SVT ablation that was successful and he has not had any further tachyarrhythmia's. Overall he reports that he id soing well. Undergoing PT in after math of rt knee replacement. No issues with his anticoagulation.   Follow up in the AF clinic 03/27/22. Patient was seen in follow up by Mertha Abrahams on 10/21/21 and found to be in persistent afib. He underwent DCCV on 11/11/21 which was unsuccessful after 3 shocks. He was seen by Dr Arlester Ladd and underwent repeat ablation on 02/27/22. He reports that he has done very well since the ablation with no afib episodes. He denies chest pain, swallowing pain, or groin issues.   Follow up in the AF clinic 05/07/22. Patient reports that for the past week+. He is currently in the process of moving and noted that he couldn't pack more than 2-3 boxes without getting very fatigued. He checked his Kardia mobile which showed rate controlled afib. There were no specific triggers that he could identify.  He did miss a dose of Eliquis  on 4/30.  Follow up 05/05/23. Patient returns for follow up for atrial fibrillation and flecainide  monitoring. He remains in rate controlled afib today and feels fatigued. He reports that he has stopped using marijuana. No missed doses of anticoagulation in the past 3 weeks.   Follow up 05/26/23. Patient was scheduled for DCCV on 05/19/23. His BP was elevated at that time and so he was given labetalol  and hydralazine . However, after the second dose of hydralazine  he became agitated and left before the DCCV could be completed. Patient reports that he left because he felt very anxious. He remains in rate controlled afib.   Today, he  denies symptoms of palpitations, chest pain, orthopnea, PND, lower extremity edema, dizziness, presyncope, syncope, snoring, daytime somnolence, bleeding, or neurologic sequela. The patient is tolerating medications without difficulties and is otherwise without complaint today.    Past Medical History:  Diagnosis Date   Anxiety    Arthritis    Benign neoplasm of colon    Benign prostatic hyperplasia with urinary obstruction    Bilateral inguinal hernia 05/29/2011   Bilateral pulmonary embolism (HCC) 3/14   admitted to [facility],  treated with Xarelto    Breast lump 03/29/2019   Calculus of kidney 10/21/2016   Cataract    Chronic bilateral low back pain with sciatica 03/21/2012  Chronic pain    Finger mass, right 10/17/2020   History of alcohol abuse 03/07/2016   History of colon polyps 11/18/2015   History of kidney stones    History of pulmonary embolism 03/19/2012   Hyperlipemia    Hypertension    Insomnia    Insomnia due to medical condition 12/23/2016   Polyuria secondary to BPH   Major depressive disorder, recurrent episode (HCC)    Mitral regurgitation 04/24/2013   Mild by TEE   Morbid obesity (HCC) 03/07/2016   Nonrheumatic mitral valve insufficiency 04/25/2013   Mild by TEE  Overview:  Overview:  Mild by TEE Overview:   Overview:  Overview:  Mild by TEE Overview:  Overview:  Mild by TEE   Obesity    OSA (obstructive sleep apnea)    noncompliant with CPAP.  07/27/13- awaiting a CPAP- unable to tolerate mask   Osteoporosis    Persistent atrial fibrillation (HCC)    Recurrent unilateral inguinal hernia 07/17/2013   Restless leg syndrome    takes gabapentin    S/P right total knee arthroplasty 05/24/2018   Sciatica    Spinal stenosis, lumbar region with neurogenic claudication    SVT (supraventricular tachycardia) (HCC) 03/07/2016   Thrombus of left atrial appendage 03/09/2013   Uncomplicated asthma 06/20/2008   Overview:  Overview:  Overview:  Overview:  Qualifier: Diagnosis of  By: Ena Harries MD, Viktoria Gray Overview:  Overview:  Qualifier: Diagnosis of  By: Ena Harries MD, Viktoria Gray   Ventral hernia, unspecified, without mention of obstruction or gangrene    right abdominal wall   Current Outpatient Medications  Medication Sig Dispense Refill   amLODipine  (NORVASC ) 5 MG tablet Take 5 mg by mouth daily.     apixaban  (ELIQUIS ) 5 MG TABS tablet Take 1 tablet (5 mg total) by mouth 2 (two) times daily. 60 tablet 5   atorvastatin  (LIPITOR) 40 MG tablet Take 1 tablet (40 mg total) by mouth daily. 90 tablet 1   baclofen  (LIORESAL ) 10 MG tablet Take 0.5-1 tablets (5-10 mg total) by mouth every 8 (eight) hours as needed for muscle spasms. 60 each 0   carvedilol  (COREG ) 25 MG tablet Take 1 tablet (25 mg total) by mouth 2 (two) times daily with a meal. 180 tablet 3   escitalopram  (LEXAPRO ) 20 MG tablet Take 1 tablet (20 mg total) by mouth daily. 90 tablet 1   flecainide  (TAMBOCOR ) 50 MG tablet TAKE 1 TABLET BY MOUTH TWICE A DAY 180 tablet 2   fluticasone  (FLONASE ) 50 MCG/ACT nasal spray SPRAY 2 SPRAYS INTO EACH NOSTRIL EVERY DAY (Patient taking differently: Place 2 sprays into both nostrils daily as needed (allergies.).) 48 mL 1   gabapentin  (NEURONTIN ) 800 MG tablet Take 1 tablet (800 mg total) by mouth at bedtime. 90 tablet 6    memantine  (NAMENDA ) 10 MG tablet TAKE ONE TABLET BY MOUTH TWICE A DAY 180 tablet 0   potassium chloride  SA (KLOR-CON  M20) 20 MEQ tablet Take 1 tablet (20 mEq total) by mouth daily. (Patient taking differently: Take 20 mEq by mouth every evening.) 90 tablet 0   tamsulosin  (FLOMAX ) 0.4 MG CAPS capsule Take 1 capsule (0.4 mg total) by mouth daily. (Patient taking differently: Take 0.4 mg by mouth every evening.) 90 capsule 1   No current facility-administered medications for this encounter.    ROS- All systems are reviewed and negative except as per the HPI above  Physical Exam: Vitals:   05/26/23 0835  BP: (!) 138/98  Pulse: 69  Weight:  125.5 kg  Height: 6\' 4"  (1.93 m)    Wt Readings from Last 3 Encounters:  05/26/23 125.5 kg  05/17/23 124 kg  05/14/23 122.8 kg     GEN: Well nourished, well developed in no acute distress CARDIAC: Irregularly irregular rate and rhythm, no murmurs, rubs, gallops RESPIRATORY:  Clear to auscultation without rales, wheezing or rhonchi  ABDOMEN: Soft, non-tender, non-distended EXTREMITIES:  No edema; No deformity    EKG today demonstrates Afib Vent. rate 69 BPM PR interval * ms QRS duration 106 ms QT/QTcB 416/445 ms   Echo 12/08/21  1. Left ventricular ejection fraction, by estimation, is 55 to 60%. The  left ventricle has normal function. The left ventricle has no regional  wall motion abnormalities. There is mild asymmetric left ventricular  hypertrophy of the basal-septal segment. Diastolic function indeterminant due to AFib.   2. Right ventricular systolic function is normal. The right ventricular  size is normal. Tricuspid regurgitation signal is inadequate for assessing  PA pressure.   3. Left atrial size was severely dilated.   4. Right atrial size was mildly dilated.   5. The mitral valve is normal in structure. Mild mitral valve  regurgitation.   6. The aortic valve is tricuspid. There is mild calcification of the  aortic valve.  There is mild thickening of the aortic valve. Aortic valve  regurgitation is trivial. Aortic valve sclerosis/calcification is present,  without any evidence of aortic stenosis.   7. Aortic dilatation noted. There is mild dilatation of the aortic root,  measuring 41 mm. There is moderate dilatation of the ascending aorta,  measuring 47 mm. Recommend CTA for further evaluation of aorta.   8. The inferior vena cava is normal in size with greater than 50%  respiratory variability, suggesting right atrial pressure of 3 mmHg.   Comparison(s): Compared to prior TTE in 2018, patient is now in Afib and  the ascending aorta is moderately diltated (previously measured at 43mm  per my review of old study). Recommend CTA to further evaluate aorta.    CHA2DS2-VASc Score = 3  The patient's score is based upon: CHF History: 0 HTN History: 1 Diabetes History: 0 Stroke History: 0 Vascular Disease History: 1 (aortic atherosclerosis) Age Score: 1 Gender Score: 0       ASSESSMENT AND PLAN: Persistent Atrial Fibrillation (ICD10:  I48.19) The patient's CHA2DS2-VASc score is 3, indicating a 3.2% annual risk of stroke.   S/p afib ablation 2017 and 02/27/22 Patient remains in rate controlled afib. He is adamant that he does not want to attempt DCCV ever again. We discussed rate vs rhythm control and he would like to pursue rate control only. Will stop flecainide .  Continue carvedilol  25 mg BID Continue Eliquis  5 mg BID Will check echo in a few months to be sure his LVEF has not decreased due to afib.   Secondary Hypercoagulable State (ICD10:  D68.69) The patient is at significant risk for stroke/thromboembolism based upon his CHA2DS2-VASc Score of 3.  Continue Apixaban  (Eliquis ). No bleeding issues.   HTN Stable on current regimen  SVT AVNRT s/p ablation  OSA  Encouraged nightly CPAP    Follow up in the AF clinic in 6 months.     Myrtha Ates PA-C Afib Clinic [facility] [address] [place], [place] [address] [phone]

## 2023-05-26 NOTE — Addendum Note (Signed)
 Encounter addended by: Tess Fife, RN on: 05/26/2023 9:20 AM  Actions taken: Medication long-term status modified, Order list changed

## 2023-05-27 ENCOUNTER — Encounter: Payer: Self-pay | Admitting: Internal Medicine

## 2023-05-28 ENCOUNTER — Ambulatory Visit (HOSPITAL_COMMUNITY): Admitting: Physician Assistant

## 2023-05-31 ENCOUNTER — Other Ambulatory Visit: Payer: Self-pay | Admitting: Internal Medicine

## 2023-05-31 DIAGNOSIS — E269 Hyperaldosteronism, unspecified: Secondary | ICD-10-CM

## 2023-05-31 DIAGNOSIS — E876 Hypokalemia: Secondary | ICD-10-CM

## 2023-06-09 ENCOUNTER — Other Ambulatory Visit: Payer: Self-pay | Admitting: Physical Medicine & Rehabilitation

## 2023-06-16 ENCOUNTER — Other Ambulatory Visit: Payer: Self-pay

## 2023-06-16 ENCOUNTER — Encounter: Payer: Self-pay | Admitting: Cardiovascular Disease

## 2023-06-16 DIAGNOSIS — F331 Major depressive disorder, recurrent, moderate: Secondary | ICD-10-CM

## 2023-06-16 MED ORDER — ESCITALOPRAM OXALATE 20 MG PO TABS: 20.0000 mg | ORAL_TABLET | Freq: Every day | ORAL | 1 refills | Status: DC

## 2023-07-03 ENCOUNTER — Other Ambulatory Visit: Payer: Self-pay | Admitting: Internal Medicine

## 2023-07-03 DIAGNOSIS — N401 Enlarged prostate with lower urinary tract symptoms: Secondary | ICD-10-CM

## 2023-07-07 ENCOUNTER — Ambulatory Visit: Payer: Self-pay | Admitting: Family Medicine

## 2023-07-07 ENCOUNTER — Ambulatory Visit (INDEPENDENT_AMBULATORY_CARE_PROVIDER_SITE_OTHER): Admitting: Family Medicine

## 2023-07-07 ENCOUNTER — Encounter: Payer: Self-pay | Admitting: Family Medicine

## 2023-07-07 VITALS — BP 132/85 | HR 83 | Temp 97.9°F | Resp 18 | Ht 76.0 in | Wt 289.2 lb

## 2023-07-07 DIAGNOSIS — I10 Essential (primary) hypertension: Secondary | ICD-10-CM | POA: Diagnosis not present

## 2023-07-07 DIAGNOSIS — E538 Deficiency of other specified B group vitamins: Secondary | ICD-10-CM

## 2023-07-07 DIAGNOSIS — R4189 Other symptoms and signs involving cognitive functions and awareness: Secondary | ICD-10-CM | POA: Diagnosis not present

## 2023-07-07 DIAGNOSIS — E785 Hyperlipidemia, unspecified: Secondary | ICD-10-CM

## 2023-07-07 DIAGNOSIS — R3916 Straining to void: Secondary | ICD-10-CM

## 2023-07-07 DIAGNOSIS — N401 Enlarged prostate with lower urinary tract symptoms: Secondary | ICD-10-CM | POA: Diagnosis not present

## 2023-07-07 DIAGNOSIS — I480 Paroxysmal atrial fibrillation: Secondary | ICD-10-CM

## 2023-07-07 DIAGNOSIS — F331 Major depressive disorder, recurrent, moderate: Secondary | ICD-10-CM

## 2023-07-07 DIAGNOSIS — E876 Hypokalemia: Secondary | ICD-10-CM

## 2023-07-07 DIAGNOSIS — R7303 Prediabetes: Secondary | ICD-10-CM | POA: Diagnosis not present

## 2023-07-07 DIAGNOSIS — R7989 Other specified abnormal findings of blood chemistry: Secondary | ICD-10-CM

## 2023-07-07 LAB — COMPREHENSIVE METABOLIC PANEL WITH GFR
ALT: 11 U/L (ref 0–53)
AST: 12 U/L (ref 0–37)
Albumin: 4.2 g/dL (ref 3.5–5.2)
Alkaline Phosphatase: 79 U/L (ref 39–117)
BUN: 14 mg/dL (ref 6–23)
CO2: 33 meq/L — ABNORMAL HIGH (ref 19–32)
Calcium: 9.2 mg/dL (ref 8.4–10.5)
Chloride: 102 meq/L (ref 96–112)
Creatinine, Ser: 0.78 mg/dL (ref 0.40–1.50)
GFR: 87.52 mL/min (ref >60.00)
Glucose, Bld: 93 mg/dL (ref 70–99)
Potassium: 3.1 meq/L — ABNORMAL LOW (ref 3.5–5.1)
Sodium: 144 meq/L (ref 135–145)
Total Bilirubin: 0.8 mg/dL (ref 0.2–1.2)
Total Protein: 6.8 g/dL (ref 6.0–8.3)

## 2023-07-07 LAB — CBC WITH DIFFERENTIAL/PLATELET
Basophils Absolute: 0 10*3/uL (ref 0.0–0.1)
Basophils Relative: 0.7 % (ref 0.0–3.0)
Eosinophils Absolute: 0.5 10*3/uL (ref 0.0–0.7)
Eosinophils Relative: 6.9 % — ABNORMAL HIGH (ref 0.0–5.0)
HCT: 40.8 % (ref 39.0–52.0)
Hemoglobin: 13.8 g/dL (ref 13.0–17.0)
Lymphocytes Relative: 19.5 % (ref 12.0–46.0)
Lymphs Abs: 1.3 10*3/uL (ref 0.7–4.0)
MCHC: 33.9 g/dL (ref 30.0–36.0)
MCV: 88.8 fl (ref 78.0–100.0)
Monocytes Absolute: 0.7 10*3/uL (ref 0.1–1.0)
Monocytes Relative: 10.9 % (ref 3.0–12.0)
Neutro Abs: 4.2 10*3/uL (ref 1.4–7.7)
Neutrophils Relative %: 62 % (ref 43.0–77.0)
Platelets: 150 10*3/uL (ref 150.0–400.0)
RBC: 4.59 Mil/uL (ref 4.22–5.81)
RDW: 13.7 % (ref 11.5–15.5)
WBC: 6.8 10*3/uL (ref 4.0–10.5)

## 2023-07-07 LAB — TSH: TSH: 0.02 u[IU]/mL — ABNORMAL LOW (ref 0.35–5.50)

## 2023-07-07 LAB — LIPID PANEL
Cholesterol: 105 mg/dL (ref 0–200)
HDL: 35.7 mg/dL — ABNORMAL LOW (ref >39.00)
LDL Cholesterol: 55 mg/dL (ref 0–99)
NonHDL: 69.35
Total CHOL/HDL Ratio: 3
Triglycerides: 72 mg/dL (ref 0.0–149.0)
VLDL: 14.4 mg/dL (ref 0.0–40.0)

## 2023-07-07 LAB — VITAMIN B12: Vitamin B-12: 195 pg/mL — ABNORMAL LOW (ref 211–911)

## 2023-07-07 LAB — HEMOGLOBIN A1C: Hgb A1c MFr Bld: 5.6 % (ref 4.6–6.5)

## 2023-07-07 MED ORDER — TAMSULOSIN HCL 0.4 MG PO CAPS: 0.4000 mg | ORAL_CAPSULE | Freq: Every day | ORAL | 0 refills | Status: DC

## 2023-07-07 MED ORDER — MEMANTINE HCL 10 MG PO TABS: 10.0000 mg | ORAL_TABLET | Freq: Two times a day (BID) | ORAL | 0 refills | Status: DC

## 2023-07-07 MED ORDER — BUPROPION HCL ER (XL) 150 MG PO TB24
150.0000 mg | ORAL_TABLET | Freq: Every day | ORAL | 1 refills | Status: DC
Start: 2023-07-07 — End: 2023-07-31

## 2023-07-07 NOTE — Progress Notes (Signed)
 Subjective:     Patient ID: Brandon KATHEE Harvel [name]., male    DOB: 1950/05/08, 73 y.o.   MRN: [id number]  Chief Complaint  Patient presents with   Transfer of Care    TOC to a new provider Not fasting     HPI Discussed the use of AI scribe software for clinical note transcription with the patient, who gave verbal consent to proceed.  History of Present Illness Brandon B Platon Arocho. is a 73 year old male with atrial fibrillation who presents with concerns about his current AFib status and associated symptoms. TOC as moved close to here  He is currently experiencing atrial fibrillation and has undergone cardioversion approximately ten times, with the most recent attempt about a month ago. He feels psychological distress related to the procedure, stating, 'I can't get shocked anymore.' He notes significant limitations in physical activity due to AFib, such as difficulty walking across a room or vacuuming, and is concerned about the impact on his health. Has had ablations as well.  Followed by Card and a fib clinic.   He has a history of pulmonary embolisms, kidney stones, high blood pressure, high cholesterol, and depression. He has not had a heart attack but has experienced 'close calls.' He has undergone three ablations, with the last one occurring six months to a year ago, and was in normal sinus rhythm for a long period post-ablation. He mentions a history of drinking that aggravated his AFib, leading him to move out of a retirement home with a bar to avoid the temptations.  SABRA  He is currently not on flecainide , as it was stopped a month ago when he decided to remain in AFib. He did not notice any difference in symptoms with or without the medication. He experiences shortness of breath with minimal exertion. No chest pain, but he reports fatigue and some swelling in the legs, which he monitors closely.  He has sleep apnea and attributes it as a contributing factor to his AFib. He previously attended  physical therapy, which he found beneficial, and plans to join a gym to resume exercise. He notes a weight gain of 19 pounds since stopping physical therapy.  He experiences frequent nocturia, getting up five to six times a night to urinate, and is trying to regulate his fluid and salt intake to manage this. He takes tamsulosin  in the evenings.  He has been on Lexapro  for 20 years for depression but still experiences depressive episodes and panic attacks. No suicidal thoughts, but he acknowledges periods of irritability and social withdrawal. He has not tried other medications for depression.  He takes potassium supplements daily but notes his levels are often low. He also takes Namenda  for memory, although he is unsure of its effects.    There are no preventive care reminders to display for this patient.  Past Medical History:  Diagnosis Date   Allergy 01/05/1664   Anxiety 05/12/1996   Arthritis 07/30/1993   Benign neoplasm of colon    Benign prostatic hyperplasia with urinary obstruction    Bilateral inguinal hernia 05/29/2011   Bilateral pulmonary embolism (HCC) 03/2012   admitted to [facility],  treated with Xarelto    Breast lump 03/29/2019   Calculus of kidney 10/21/2016   Cataract    Chronic bilateral low back pain with sciatica 03/21/2012   Chronic pain    Finger mass, right 10/17/2020   History of alcohol abuse 03/07/2016   History of colon polyps 11/18/2015   History of  kidney stones    History of pulmonary embolism 03/19/2012   Hyperlipemia    Hypertension    Insomnia    Insomnia due to medical condition 12/23/2016   Polyuria secondary to BPH   Major depressive disorder, recurrent episode (HCC)    Mitral regurgitation 04/24/2013   Mild by TEE   Morbid obesity (HCC) 03/07/2016   Myocardial infarction (HCC) 02/21/2003   Nonrheumatic mitral valve insufficiency 04/25/2013   Mild by TEE  Overview:  Overview:  Mild by TEE Overview:  Overview:  Overview:  Mild by TEE  Overview:  Overview:  Mild by TEE   Obesity    OSA (obstructive sleep apnea)    noncompliant with CPAP.  07/27/13- awaiting a CPAP- unable to tolerate mask   Osteoporosis    Persistent atrial fibrillation (HCC)    Recurrent unilateral inguinal hernia 07/17/2013   Restless leg syndrome    takes gabapentin    S/P right total knee arthroplasty 05/24/2018   Sciatica    Sleep apnea    Spinal stenosis, lumbar region with neurogenic claudication    SVT (supraventricular tachycardia) (HCC) 03/07/2016   Thrombus of left atrial appendage 03/09/2013   Uncomplicated asthma 06/20/2008   Overview:  Overview:  Overview:  Overview:  Qualifier: Diagnosis of  By: Krystal MD, Reyes LABOR Overview:  Overview:  Qualifier: Diagnosis of  By: Krystal MD, Reyes LABOR   Ventral hernia, unspecified, without mention of obstruction or gangrene    right abdominal wall    Past Surgical History:  Procedure Laterality Date   ATRIAL FIBRILLATION ABLATION N/A 02/27/2022   Procedure: ATRIAL FIBRILLATION ABLATION;  Surgeon: Nancey Eulas BRAVO, MD;  Location: [facility] INVASIVE CV LAB;  Service: Cardiovascular;  Laterality: N/A;   CARDIOVERSION N/A 03/21/2012   Procedure: CARDIOVERSION;  Surgeon: Salena GORMAN Negri, MD;  Location: [facility] ENDOSCOPY;  Service: Cardiovascular;  Laterality: N/A;   CARDIOVERSION N/A 04/24/2013   Procedure: CARDIOVERSION;  Surgeon: Leim VEAR Moose, MD;  Location: [facility] ENDOSCOPY;  Service: Cardiovascular;  Laterality: N/A;   CARDIOVERSION N/A 06/27/2015   Procedure: CARDIOVERSION;  Surgeon: Ezra GORMAN Shuck, MD;  Location: [facility] ENDOSCOPY;  Service: Cardiovascular;  Laterality: N/A;   CARDIOVERSION N/A 11/11/2021   Procedure: CARDIOVERSION;  Surgeon: Pietro Redell GORMAN, MD;  Location: [facility] ENDOSCOPY;  Service: Cardiovascular;  Laterality: N/A;   CARDIOVERSION N/A 05/11/2022   Procedure: CARDIOVERSION;  Surgeon: Santo Stanly LABOR, MD;  Location: [facility] CV LAB;  Service: Cardiovascular;  Laterality: N/A;    CARDIOVERSION N/A 07/13/2022   Procedure: CARDIOVERSION;  Surgeon: Francyne Headland, MD;  Location: [facility];  Service: Cardiovascular;  Laterality: N/A;   CATARACT EXTRACTION     COLONOSCOPY W/ POLYPECTOMY     ELECTROPHYSIOLOGIC STUDY N/A 07/30/2015   Procedure: Atrial Fibrillation Ablation;  Surgeon: Lynwood Rakers, MD;  Location: [facility] INVASIVE CV LAB;  Service: Cardiovascular;  Laterality: N/A;   EXTRACORPOREAL SHOCK WAVE LITHOTRIPSY Right 10/29/2016   Procedure: RIGHT EXTRACORPOREAL SHOCK WAVE LITHOTRIPSY (ESWL);  Surgeon: Alvaro Hummer, MD;  Location: [facility] ORS;  Service: Urology;  Laterality: Right;   EYE SURGERY Right    cataract   FEMUR FRACTURE SURGERY     HERNIA REPAIR  07/06/2011   INGUINAL HERNIA REPAIR Right 07/28/2013   Procedure: RIGHT INGUINAL HERNIA REPAIR;  Surgeon: Donnice POUR. Belinda, MD;  Location: [facility] OR;  Service: General;  Laterality: Right;   INGUINAL HERNIA REPAIR Right 09/22/2016   Procedure: LAPAROSCOPIC RIGHT INGUINAL HERNIA;  Surgeon: Stevie Herlene Righter, MD;  Location: [facility] ORS;  Service:  General;  Laterality: Right;  With MESH   INSERTION OF MESH Right 07/28/2013   Procedure: INSERTION OF MESH;  Surgeon: Donnice POUR. Tsuei, MD;  Location: [facility] OR;  Service: General;  Laterality: Right;   JOINT REPLACEMENT  Both knees 2018   KNEE ARTHROSCOPY     left   REPLACEMENT TOTAL KNEE Left    ROTATOR CUFF REPAIR     right   ROTATOR CUFF REPAIR Left 03/08/2014   DR SUPPLE   SHOULDER ARTHROSCOPY WITH ROTATOR CUFF REPAIR AND SUBACROMIAL DECOMPRESSION Left 03/08/2014   Procedure: LEFT SHOULDER ARTHROSCOPY WITH ROTATOR CUFF REPAIR/SUBACROMIAL DECOMPRESSION/DISTAL CLAVICLE RESECTION;  Surgeon: Franky CHRISTELLA Pointer, MD;  Location: [facility] OR;  Service: Orthopedics;  Laterality: Left;   SVT ABLATION N/A 01/23/2019   Procedure: SVT ABLATION;  Surgeon: Waddell Danelle ORN, MD;  Location: [facility] INVASIVE CV LAB;  Service: Cardiovascular;  Laterality: N/A;   TEE WITHOUT CARDIOVERSION N/A 03/21/2012    Procedure: TRANSESOPHAGEAL ECHOCARDIOGRAM (TEE);  Surgeon: Salena GORMAN Negri, MD;  Location: [facility] ENDOSCOPY;  Service: Cardiovascular;  Laterality: N/A;   TEE WITHOUT CARDIOVERSION N/A 04/24/2013   Procedure: TRANSESOPHAGEAL ECHOCARDIOGRAM (TEE);  Surgeon: Leim VEAR Moose, MD;  Location: [facility] ENDOSCOPY;  Service: Cardiovascular;  Laterality: N/A;   TEE WITHOUT CARDIOVERSION N/A 07/29/2015   Procedure: TRANSESOPHAGEAL ECHOCARDIOGRAM (TEE);  Surgeon: Vina LULLA Gull, MD;  Location: [facility] ENDOSCOPY;  Service: Cardiovascular;  Laterality: N/A;   TEE WITHOUT CARDIOVERSION N/A 05/11/2022   Procedure: TRANSESOPHAGEAL ECHOCARDIOGRAM;  Surgeon: Santo Stanly LABOR, MD;  Location: [facility] INVASIVE CV LAB;  Service: Cardiovascular;  Laterality: N/A;   TONSILLECTOMY     TOTAL KNEE ARTHROPLASTY Right 05/24/2018   Procedure: RIGHT TOTAL KNEE ARTHROPLASTY;  Surgeon: Ernie Donnice, MD;  Location: [facility] ORS;  Service: Orthopedics;  Laterality: Right;  70 mins     Current Outpatient Medications:    amLODipine  (NORVASC ) 5 MG tablet, Take 5 mg by mouth daily., Disp: , Rfl:    apixaban  (ELIQUIS ) 5 MG TABS tablet, Take 1 tablet (5 mg total) by mouth 2 (two) times daily., Disp: 60 tablet, Rfl: 5   atorvastatin  (LIPITOR) 40 MG tablet, Take 1 tablet (40 mg total) by mouth daily., Disp: 90 tablet, Rfl: 1   baclofen  (LIORESAL ) 10 MG tablet, TAKE 0.5-1 TABLETS (5-10 MG TOTAL) BY MOUTH EVERY 8 (EIGHT) HOURS AS NEEDED FOR MUSCLE SPASMS., Disp: 180 tablet, Rfl: 1   buPROPion  (WELLBUTRIN  XL) 150 MG 24 hr tablet, Take 1 tablet (150 mg total) by mouth daily., Disp: 30 tablet, Rfl: 1   carvedilol  (COREG ) 25 MG tablet, Take 1 tablet (25 mg total) by mouth 2 (two) times daily with a meal., Disp: 180 tablet, Rfl: 3   escitalopram  (LEXAPRO ) 20 MG tablet, Take 1 tablet (20 mg total) by mouth daily., Disp: 90 tablet, Rfl: 1   fluticasone  (FLONASE ) 50 MCG/ACT nasal spray, SPRAY 2 SPRAYS INTO EACH NOSTRIL EVERY DAY (Patient taking differently: Place  2 sprays into both nostrils daily as needed (allergies.).), Disp: 48 mL, Rfl: 1   gabapentin  (NEURONTIN ) 800 MG tablet, Take 1 tablet (800 mg total) by mouth at bedtime., Disp: 90 tablet, Rfl: 6   METAMUCIL FIBER PO, as needed., Disp: , Rfl:    potassium chloride  SA (KLOR-CON  M20) 20 MEQ tablet, Take 1 tablet (20 mEq total) by mouth daily. (Patient taking differently: Take 20 mEq by mouth every evening.), Disp: 90 tablet, Rfl: 0   memantine  (NAMENDA ) 10 MG tablet, Take 1 tablet (10 mg total) by mouth 2 (two) times daily.,  Disp: 180 tablet, Rfl: 0   tamsulosin  (FLOMAX ) 0.4 MG CAPS capsule, Take 1 capsule (0.4 mg total) by mouth daily. TAKE 1 CAPSULE BY MOUTH EVERY DAY. Patient to follow up with PCP prior to future refills, Disp: 90 capsule, Rfl: 0  Allergies  Allergen Reactions   Ace Inhibitors Cough   Albuterol  Other (See Comments)    Racing heart   ROS neg/noncontributory except as noted HPI/below      Objective:     BP 132/85   Pulse 83   Temp 97.9 F (36.6 C) (Temporal)   Resp 18   Ht 6' 4 (1.93 m)   Wt 289 lb 4 oz (131.2 kg)   SpO2 96%   BMI 35.21 kg/m  Wt Readings from Last 3 Encounters:  07/07/23 289 lb 4 oz (131.2 kg)  05/26/23 276 lb 9.6 oz (125.5 kg)  05/17/23 273 lb 6.4 oz (124 kg)    Physical Exam   Gen: WDWN NAD HEENT: NCAT, conjunctiva not injected, sclera nonicteric NECK:  supple, no thyromegaly, no nodes, no carotid bruits CARDIAC: irreg, S1S2+, no murmur.  LUNGS: CTAB. No wheezes ABDOMEN:  BS+, soft, NTND, No HSM, no masses EXT:  no edema MSK: no gross abnormalities.  NEURO: A&O x3.  CN II-XII intact.  PSYCH: normal mood. Good eye contact  Reviewed chart, labs, etc. 35 min total w/pt    Assessment & Plan:  Paroxysmal atrial fibrillation (HCC) -     CBC with Differential/Platelet -     Comprehensive metabolic panel with GFR -     TSH  Essential hypertension  Prediabetes -     Hemoglobin A1c  Hyperlipidemia LDL goal <130 -      Comprehensive metabolic panel with GFR -     Lipid panel  Benign prostatic hyperplasia (BPH) with straining on urination -     Tamsulosin  HCl; Take 1 capsule (0.4 mg total) by mouth daily. TAKE 1 CAPSULE BY MOUTH EVERY DAY. Patient to follow up with PCP prior to future refills  Dispense: 90 capsule; Refill: 0  Cognitive decline -     Memantine  HCl; Take 1 tablet (10 mg total) by mouth 2 (two) times daily.  Dispense: 180 tablet; Refill: 0 -     Vitamin B12  Moderate episode of recurrent major depressive disorder (HCC) -     buPROPion  HCl ER (XL); Take 1 tablet (150 mg total) by mouth daily.  Dispense: 30 tablet; Refill: 1  Assessment and Plan Assessment & Plan Atrial Fibrillation (AFib)   Chronic atrial fibrillation presents with dyspnea and fatigue, impacting daily activities. He has a history of multiple cardioversions and ablations and is not on flecainide , choosing to remain in AFib. There is no significant edema or angina, and blood pressure is well-managed. Quality of life is a concern due to difficulty performing activities like walking or vacuuming without rest. His history of pulmonary embolisms and sleep apnea may contribute to his condition. Regular follow-up with a cardiologist is encouraged, with discussions on further intervention if symptoms worsen. Weight management and exercise are advised to improve symptoms.  HTN-chronic.  Controlled.  Continue amlodipine  5mg , coreg  25mg  bid  preDM-working on diet and will start exercising  Depression   Chronic depression has been managed with Lexapro  for 20 years, yet symptoms persist without suicidal ideation. He experiences panic attacks and is interested in alternative treatments. Wellbutrin  will be prescribed to augment Lexapro , with monitoring for increased anxiety or panic attacks. Alternative antidepressants will be considered if Wellbutrin   is not tolerated.  Benign Prostatic Hyperplasia (BPH)   He experiences nocturia with  frequent urination at night, possibly related to caffeine and salt intake. Currently on tamsulosin , he plans to reduce caffeine intake to assess its impact on symptoms. Caffeine may cause prostate spasms, and a trial of decaffeinated tea in the evening is advised. A reduction in caffeine intake, especially in the evening, is recommended. An appointment with a urologist for further evaluation is scheduled, and tamsulosin  will be continued.  General Health Maintenance   He plans to resume exercise for weight management and overall health improvement. Having previously benefited from physical therapy, he plans to join a gym. After gaining 19 pounds since stopping physical therapy, he is committed to starting exercise at the [facility]. Regular exercise and weight management are encouraged, with support for joining a gym and participating in senior classes.  Follow-up   A follow-up is needed to assess the response to new medication and overall health status. Blood work is necessary to monitor potassium levels, as they tend to run low despite supplementation. A follow-up appointment is scheduled in one month to evaluate medication effects and symptom management, with blood work to monitor potassium levels.    Return in about 4 weeks (around 08/04/2023) for chronic follow-up.  Jenkins CHRISTELLA Carrel, MD

## 2023-07-07 NOTE — Patient Instructions (Addendum)
 It was very nice to see you today!  No caffeine/tea at supper for few days and see if peeing better See urologist   PLEASE NOTE:  If you had any lab tests please let us  know if you have not heard back within a few days. You may see your results on MyChart before we have a chance to review them but we will give you a call once they are reviewed by us . If we ordered any referrals today, please let us  know if you have not heard from their office within the next week.   Please try these tips to maintain a healthy lifestyle:  Eat most of your calories during the day when you are active. Eliminate processed foods including packaged sweets (pies, cakes, cookies), reduce intake of potatoes, white bread, white pasta, and white rice. Look for whole grain options, oat flour or almond flour.  Each meal should contain half fruits/vegetables, one quarter protein, and one quarter carbs (no bigger than a computer mouse).  Cut down on sweet beverages. This includes juice, soda, and sweet tea. Also watch fruit intake, though this is a healthier sweet option, it still contains natural sugar! Limit to 3 servings daily.  Drink at least 1 glass of water with each meal and aim for at least 8 glasses per day  Exercise at least 150 minutes every week.

## 2023-07-07 NOTE — Progress Notes (Signed)
 Potassium too low-increase to bid.  Reck bmp 1 wk Thyroid  being strange-is he taking any hair and nail supplements? Repeat tsh, ft3 and ft4 in 1 wk(if taking those supps, stop) B12 is very low-needs to take otc 1032mcg/day but wait 1 wk till repeat the above labs.

## 2023-07-19 ENCOUNTER — Other Ambulatory Visit: Payer: Self-pay

## 2023-07-19 MED ORDER — POTASSIUM CHLORIDE CRYS ER 20 MEQ PO TBCR
20.0000 meq | EXTENDED_RELEASE_TABLET | Freq: Two times a day (BID) | ORAL | 3 refills | Status: DC
Start: 2023-07-19 — End: 2023-08-08

## 2023-07-30 ENCOUNTER — Other Ambulatory Visit: Payer: Self-pay | Admitting: Internal Medicine

## 2023-07-30 DIAGNOSIS — E876 Hypokalemia: Secondary | ICD-10-CM

## 2023-07-31 ENCOUNTER — Other Ambulatory Visit: Payer: Self-pay | Admitting: Family Medicine

## 2023-07-31 DIAGNOSIS — F331 Major depressive disorder, recurrent, moderate: Secondary | ICD-10-CM

## 2023-08-06 ENCOUNTER — Other Ambulatory Visit

## 2023-08-06 ENCOUNTER — Encounter: Payer: Self-pay | Admitting: Family Medicine

## 2023-08-06 ENCOUNTER — Ambulatory Visit (INDEPENDENT_AMBULATORY_CARE_PROVIDER_SITE_OTHER): Admitting: Family Medicine

## 2023-08-06 VITALS — BP 125/87 | HR 64 | Temp 97.5°F | Resp 18 | Ht 76.0 in | Wt 277.2 lb

## 2023-08-06 DIAGNOSIS — E876 Hypokalemia: Secondary | ICD-10-CM | POA: Diagnosis not present

## 2023-08-06 DIAGNOSIS — R4189 Other symptoms and signs involving cognitive functions and awareness: Secondary | ICD-10-CM | POA: Diagnosis not present

## 2023-08-06 DIAGNOSIS — N401 Enlarged prostate with lower urinary tract symptoms: Secondary | ICD-10-CM | POA: Diagnosis not present

## 2023-08-06 DIAGNOSIS — I480 Paroxysmal atrial fibrillation: Secondary | ICD-10-CM

## 2023-08-06 DIAGNOSIS — R3916 Straining to void: Secondary | ICD-10-CM

## 2023-08-06 DIAGNOSIS — R7989 Other specified abnormal findings of blood chemistry: Secondary | ICD-10-CM | POA: Diagnosis not present

## 2023-08-06 DIAGNOSIS — E538 Deficiency of other specified B group vitamins: Secondary | ICD-10-CM

## 2023-08-06 DIAGNOSIS — R21 Rash and other nonspecific skin eruption: Secondary | ICD-10-CM | POA: Diagnosis not present

## 2023-08-06 DIAGNOSIS — F325 Major depressive disorder, single episode, in full remission: Secondary | ICD-10-CM

## 2023-08-06 LAB — VITAMIN B12: Vitamin B-12: 145 pg/mL — ABNORMAL LOW (ref 211–911)

## 2023-08-06 LAB — BASIC METABOLIC PANEL WITH GFR
BUN: 11 mg/dL (ref 6–23)
CO2: 34 meq/L — ABNORMAL HIGH (ref 19–32)
Calcium: 9.1 mg/dL (ref 8.4–10.5)
Chloride: 101 meq/L (ref 96–112)
Creatinine, Ser: 0.8 mg/dL (ref 0.40–1.50)
GFR: 86.8 mL/min (ref >60.00)
Glucose, Bld: 87 mg/dL (ref 70–99)
Potassium: 3.2 meq/L — ABNORMAL LOW (ref 3.5–5.1)
Sodium: 143 meq/L (ref 135–145)

## 2023-08-06 LAB — TSH: TSH: <0.01 u[IU]/mL — ABNORMAL LOW (ref 0.35–5.50)

## 2023-08-06 LAB — T4, FREE: Free T4: 1.82 ng/dL — ABNORMAL HIGH (ref 0.60–1.60)

## 2023-08-06 LAB — T3, FREE: T3, Free: 5.4 pg/mL — ABNORMAL HIGH (ref 2.3–4.2)

## 2023-08-06 MED ORDER — MEMANTINE HCL 10 MG PO TABS: 10.0000 mg | ORAL_TABLET | Freq: Two times a day (BID) | ORAL | 1 refills | Status: DC

## 2023-08-06 MED ORDER — TAMSULOSIN HCL 0.4 MG PO CAPS: 0.4000 mg | ORAL_CAPSULE | Freq: Every day | ORAL | 3 refills | Status: AC

## 2023-08-06 MED ORDER — KETOCONAZOLE 2 % EX CREA: 1.0000 | TOPICAL_CREAM | Freq: Two times a day (BID) | CUTANEOUS | 0 refills | Status: AC

## 2023-08-06 NOTE — Patient Instructions (Addendum)
 It was very nice to see you today!  Your Vitamin B12 is low or low normal-please take B12 vitamins 2000mcg(B complex vitamins are fine as well).    Give the meds more time and track mood swings.  If need to increase, let me know  See urologist  Ketoconazole cream for nose.  Is not better in 2-3 wks and will add steroid cream   PLEASE NOTE:  If you had any lab tests please let us  know if you have not heard back within a few days. You may see your results on MyChart before we have a chance to review them but we will give you a call once they are reviewed by us . If we ordered any referrals today, please let us  know if you have not heard from their office within the next week.   Please try these tips to maintain a healthy lifestyle:  Eat most of your calories during the day when you are active. Eliminate processed foods including packaged sweets (pies, cakes, cookies), reduce intake of potatoes, white bread, white pasta, and white rice. Look for whole grain options, oat flour or almond flour.  Each meal should contain half fruits/vegetables, one quarter protein, and one quarter carbs (no bigger than a computer mouse).  Cut down on sweet beverages. This includes juice, soda, and sweet tea. Also watch fruit intake, though this is a healthier sweet option, it still contains natural sugar! Limit to 3 servings daily.  Drink at least 1 glass of water with each meal and aim for at least 8 glasses per day  Exercise at least 150 minutes every week.

## 2023-08-06 NOTE — Progress Notes (Signed)
 Subjective:     Patient ID: Brandon KATHEE Harvel [name]., male    DOB: 1950-04-06, 73 y.o.   MRN: [id number]  Chief Complaint  Patient presents with   Medical Management of Chronic Issues    4 week follow-up Rash on side of nose, noticed about 2 months ago Not fasting     HPI K, mood rash Discussed the use of AI scribe software for clinical note transcription with the patient, who gave verbal consent to proceed.  History of Present Illness Brandon B Roll [name]. is a 73 year old male who presents for follow-up on multiple health issues including low potassium, low B12, and a facial rash.  He reports that he has been told his potassium was low and is currently taking potassium supplements twice a day. There has been no noticeable change in symptoms before and after starting the supplements. He is scheduled for lab work after this appointment to reassess his thyroid  and potassium levels.  He was informed of low B12 levels but does not recall being advised to take B12 vitamins.  He is currently taking Wellbutrin (new) and Lexapro  for mood management. He has not noticed significant changes in his mood or side effects from the medication. He describes himself as adept at managing mood swings by 'closing those doors' when he feels a depressive episode coming on.  He has a rash on the left side of his nose that has been present for about two months. It occasionally itches and becomes dry and flaky. He has been applying Palmer's cocoa butter with vitamin E, which he thought was helping, but the rash persists. He associates the onset of the rash with starting physical therapy in an aqua tank.  He has a history of atrial fibrillation and is not currently taking medication to maintain normal sinus rhythm. He experiences dizziness and fatigue with prolonged activity, which he manages by resting.  He has been on tamsulosin  (Flomax ) for prostate issues for a long time and has not yet scheduled an appointment with a  urologist.  He recently joined the [facility] with the intention of starting an exercise routine, although he has not yet begun attending. His current medications include Wellbutrin , Lexapro , Namenda , and potassium supplements.   Abn TSH-hasn't done labs yet    Health Maintenance Due  Topic Date Due   INFLUENZA VACCINE  08/06/2023    Past Medical History:  Diagnosis Date   Allergy 01/05/1664   Anxiety 05/12/1996   Arthritis 07/30/1993   Benign neoplasm of colon    Benign prostatic hyperplasia with urinary obstruction    Bilateral inguinal hernia 05/29/2011   Bilateral pulmonary embolism (HCC) 03/2012   admitted to [facility],  treated with Xarelto    Breast lump 03/29/2019   Calculus of kidney 10/21/2016   Cataract    Chronic bilateral low back pain with sciatica 03/21/2012   Chronic pain    Finger mass, right 10/17/2020   History of alcohol abuse 03/07/2016   History of colon polyps 11/18/2015   History of kidney stones    History of pulmonary embolism 03/19/2012   Hyperlipemia    Hypertension    Insomnia    Insomnia due to medical condition 12/23/2016   Polyuria secondary to BPH   Major depressive disorder, recurrent episode (HCC)    Mitral regurgitation 04/24/2013   Mild by TEE   Morbid obesity (HCC) 03/07/2016   Myocardial infarction (HCC) 02/21/2003   Nonrheumatic mitral valve insufficiency 04/25/2013   Mild by TEE  Overview:  Overview:  Mild by TEE Overview:  Overview:  Overview:  Mild by TEE Overview:  Overview:  Mild by TEE   Obesity    OSA (obstructive sleep apnea)    noncompliant with CPAP.  07/27/13- awaiting a CPAP- unable to tolerate mask   Osteoporosis    Persistent atrial fibrillation (HCC)    Recurrent unilateral inguinal hernia 07/17/2013   Restless leg syndrome    takes gabapentin    S/P right total knee arthroplasty 05/24/2018   Sciatica    Sleep apnea    Spinal stenosis, lumbar region with neurogenic claudication    SVT (supraventricular tachycardia)  (HCC) 03/07/2016   Thrombus of left atrial appendage 03/09/2013   Uncomplicated asthma 06/20/2008   Overview:  Overview:  Overview:  Overview:  Qualifier: Diagnosis of  By: Krystal MD, Reyes LABOR Overview:  Overview:  Qualifier: Diagnosis of  By: Krystal MD, Reyes LABOR   Ventral hernia, unspecified, without mention of obstruction or gangrene    right abdominal wall    Past Surgical History:  Procedure Laterality Date   ATRIAL FIBRILLATION ABLATION N/A 02/27/2022   Procedure: ATRIAL FIBRILLATION ABLATION;  Surgeon: Nancey Eulas BRAVO, MD;  Location: [facility] INVASIVE CV LAB;  Service: Cardiovascular;  Laterality: N/A;   CARDIOVERSION N/A 03/21/2012   Procedure: CARDIOVERSION;  Surgeon: Salena GORMAN Negri, MD;  Location: [facility] ENDOSCOPY;  Service: Cardiovascular;  Laterality: N/A;   CARDIOVERSION N/A 04/24/2013   Procedure: CARDIOVERSION;  Surgeon: Leim VEAR Moose, MD;  Location: [facility] ENDOSCOPY;  Service: Cardiovascular;  Laterality: N/A;   CARDIOVERSION N/A 06/27/2015   Procedure: CARDIOVERSION;  Surgeon: Ezra GORMAN Shuck, MD;  Location: [facility] ENDOSCOPY;  Service: Cardiovascular;  Laterality: N/A;   CARDIOVERSION N/A 11/11/2021   Procedure: CARDIOVERSION;  Surgeon: Pietro Redell GORMAN, MD;  Location: [facility] ENDOSCOPY;  Service: Cardiovascular;  Laterality: N/A;   CARDIOVERSION N/A 05/11/2022   Procedure: CARDIOVERSION;  Surgeon: Santo Stanly LABOR, MD;  Location: [facility] CV [facility];  Service: Cardiovascular;  Laterality: N/A;   CARDIOVERSION N/A 07/13/2022   Procedure: CARDIOVERSION;  Surgeon: Francyne Headland, MD;  Location: [facility];  Service: Cardiovascular;  Laterality: N/A;   CATARACT EXTRACTION     COLONOSCOPY W/ POLYPECTOMY     ELECTROPHYSIOLOGIC STUDY N/A 07/30/2015   Procedure: Atrial Fibrillation Ablation;  Surgeon: Lynwood Rakers, MD;  Location: [facility] INVASIVE CV LAB;  Service: Cardiovascular;  Laterality: N/A;   EXTRACORPOREAL SHOCK WAVE LITHOTRIPSY Right 10/29/2016   Procedure: RIGHT  EXTRACORPOREAL SHOCK WAVE LITHOTRIPSY (ESWL);  Surgeon: Alvaro Hummer, MD;  Location: [facility] ORS;  Service: Urology;  Laterality: Right;   EYE SURGERY Right    cataract   FEMUR FRACTURE SURGERY     HERNIA REPAIR  07/06/2011   INGUINAL HERNIA REPAIR Right 07/28/2013   Procedure: RIGHT INGUINAL HERNIA REPAIR;  Surgeon: Donnice POUR. Belinda, MD;  Location: [facility] OR;  Service: General;  Laterality: Right;   INGUINAL HERNIA REPAIR Right 09/22/2016   Procedure: LAPAROSCOPIC RIGHT INGUINAL HERNIA;  Surgeon: Stevie Herlene Righter, MD;  Location: [facility] ORS;  Service: General;  Laterality: Right;  With MESH   INSERTION OF MESH Right 07/28/2013   Procedure: INSERTION OF MESH;  Surgeon: Donnice POUR. Tsuei, MD;  Location: [facility] OR;  Service: General;  Laterality: Right;   JOINT REPLACEMENT  Both knees 2018   KNEE ARTHROSCOPY     left   REPLACEMENT TOTAL KNEE Left    ROTATOR CUFF REPAIR     right   ROTATOR CUFF REPAIR Left 03/08/2014   DR SUPPLE  SHOULDER ARTHROSCOPY WITH ROTATOR CUFF REPAIR AND SUBACROMIAL DECOMPRESSION Left 03/08/2014   Procedure: LEFT SHOULDER ARTHROSCOPY WITH ROTATOR CUFF REPAIR/SUBACROMIAL DECOMPRESSION/DISTAL CLAVICLE RESECTION;  Surgeon: Franky CHRISTELLA Pointer, MD;  Location: [facility] OR;  Service: Orthopedics;  Laterality: Left;   SVT ABLATION N/A 01/23/2019   Procedure: SVT ABLATION;  Surgeon: Waddell Danelle ORN, MD;  Location: [facility] INVASIVE CV LAB;  Service: Cardiovascular;  Laterality: N/A;   TEE WITHOUT CARDIOVERSION N/A 03/21/2012   Procedure: TRANSESOPHAGEAL ECHOCARDIOGRAM (TEE);  Surgeon: Salena GORMAN Negri, MD;  Location: [facility] ENDOSCOPY;  Service: Cardiovascular;  Laterality: N/A;   TEE WITHOUT CARDIOVERSION N/A 04/24/2013   Procedure: TRANSESOPHAGEAL ECHOCARDIOGRAM (TEE);  Surgeon: Leim VEAR Moose, MD;  Location: [facility] ENDOSCOPY;  Service: Cardiovascular;  Laterality: N/A;   TEE WITHOUT CARDIOVERSION N/A 07/29/2015   Procedure: TRANSESOPHAGEAL ECHOCARDIOGRAM (TEE);  Surgeon: Vina LULLA Gull, MD;  Location: [facility]  ENDOSCOPY;  Service: Cardiovascular;  Laterality: N/A;   TEE WITHOUT CARDIOVERSION N/A 05/11/2022   Procedure: TRANSESOPHAGEAL ECHOCARDIOGRAM;  Surgeon: Santo Stanly LABOR, MD;  Location: [facility]VAS[facility] CV LAB;  Service: Cardiovascular;  Laterality: N/A;   TONSILLECTOMY     TOTAL KNEE ARTHROPLASTY Right 05/24/2018   Procedure: RIGHT TOTAL KNEE ARTHROPLASTY;  Surgeon: Ernie Cough, MD;  Location: [facility] ORS;  Service: Orthopedics;  Laterality: Right;  70 mins     Current Outpatient Medications:    amLODipine  (NORVASC ) 5 MG tablet, Take 5 mg by mouth daily., Disp: , Rfl:    apixaban  (ELIQUIS ) 5 MG TABS tablet, Take 1 tablet (5 mg total) by mouth 2 (two) times daily., Disp: 60 tablet, Rfl: 5   atorvastatin  (LIPITOR) 40 MG tablet, Take 1 tablet (40 mg total) by mouth daily., Disp: 90 tablet, Rfl: 1   baclofen  (LIORESAL ) 10 MG tablet, TAKE 0.5-1 TABLETS (5-10 MG TOTAL) BY MOUTH EVERY 8 (EIGHT) HOURS AS NEEDED FOR MUSCLE SPASMS., Disp: 180 tablet, Rfl: 1   buPROPion  (WELLBUTRIN  XL) 150 MG 24 hr tablet, TAKE 1 TABLET BY MOUTH EVERY DAY, Disp: 90 tablet, Rfl: 1   carvedilol  (COREG ) 25 MG tablet, Take 1 tablet (25 mg total) by mouth 2 (two) times daily with a meal., Disp: 180 tablet, Rfl: 3   escitalopram  (LEXAPRO ) 20 MG tablet, Take 1 tablet (20 mg total) by mouth daily., Disp: 90 tablet, Rfl: 1   fluticasone  (FLONASE ) 50 MCG/ACT nasal spray, SPRAY 2 SPRAYS INTO EACH NOSTRIL EVERY DAY (Patient taking differently: Place 2 sprays into both nostrils daily as needed (allergies.).), Disp: 48 mL, Rfl: 1   gabapentin  (NEURONTIN ) 800 MG tablet, Take 1 tablet (800 mg total) by mouth at bedtime., Disp: 90 tablet, Rfl: 6   ketoconazole (NIZORAL) 2 % cream, Apply 1 Application topically 2 (two) times daily., Disp: 60 g, Rfl: 0   potassium chloride  SA (KLOR-CON  M) 20 MEQ tablet, Take 1 tablet (20 mEq total) by mouth 2 (two) times daily., Disp: 60 tablet, Rfl: 3   memantine  (NAMENDA ) 10 MG tablet, Take 1 tablet (10 mg  total) by mouth 2 (two) times daily., Disp: 180 tablet, Rfl: 1   tamsulosin  (FLOMAX ) 0.4 MG CAPS capsule, Take 1 capsule (0.4 mg total) by mouth daily. TAKE 1 CAPSULE BY MOUTH EVERY DAY. Patient to follow up with PCP prior to future refills, Disp: 90 capsule, Rfl: 3  Allergies  Allergen Reactions   Ace Inhibitors Cough   Albuterol  Other (See Comments)    Racing heart   ROS neg/noncontributory except as noted HPI/below      Objective:  BP 125/87   Pulse 64   Temp (!) 97.5 F (36.4 C) (Temporal)   Resp 18   Ht 6' 4 (1.93 m)   Wt 277 lb 4 oz (125.8 kg)   SpO2 94%   BMI 33.75 kg/m  Wt Readings from Last 3 Encounters:  08/06/23 277 lb 4 oz (125.8 kg)  07/07/23 289 lb 4 oz (131.2 kg)  05/26/23 276 lb 9.6 oz (125.5 kg)    Physical Exam   Gen: WDWN NAD HEENT: NCAT, conjunctiva not injected, sclera nonicteric CARDIAC: irr RR, S1S2+, no murmur.  LUNGS: CTAB. No wheezes MSK: no gross abnormalities.  NEURO: A&O x3.  CN II-XII intact.  PSYCH: normal mood. Good eye contact   Reviewed labs    Assessment & Plan:  Low serum potassium -     Basic metabolic panel with GFR  Abnormal TSH  Low serum vitamin B12 -     Vitamin B12  Depression, major, single episode, complete remission (HCC)  Benign prostatic hyperplasia (BPH) with straining on urination -     Tamsulosin  HCl; Take 1 capsule (0.4 mg total) by mouth daily. TAKE 1 CAPSULE BY MOUTH EVERY DAY. Patient to follow up with PCP prior to future refills  Dispense: 90 capsule; Refill: 3  Cognitive decline -     Memantine  HCl; Take 1 tablet (10 mg total) by mouth 2 (two) times daily.  Dispense: 180 tablet; Refill: 1  Paroxysmal atrial fibrillation (HCC) -     T3, free -     T4, free -     TSH  Rash  Other orders -     Ketoconazole; Apply 1 Application topically 2 (two) times daily.  Dispense: 60 g; Refill: 0  Assessment and Plan Assessment & Plan Hypokalemia   Potassium levels were low on previous tests. He  takes potassium twice daily without symptoms or changes. Recheck potassium levels with upcoming labs to assess dosage adequacy.  Vitamin B12 deficiency   B12 levels were low, potentially affecting nerves and memory. He does not recall previous B12 supplementation instructions, possibly due to memory issues from the deficiency. Instruct him to start taking B12 vitamins.  Thyroid  function abnormality   Previous labs indicated near hyperactive thyroid  function, which could worsen atrial fibrillation. Await further lab results to confirm current thyroid  status. Order thyroid  function tests to reassess.  Major depressive disorder   He is on Wellbutrin  and Lexapro  with no side effects but no mood improvement. He manages depressive episodes independently. Continue current medication regimen. Monitor mood changes and report if an increase in medication is needed.  Seborrheic dermatitis, left nose   A rash on the left side of the nose has been present for about two months, dry, flaky, and occasionally itchy. Differential diagnosis includes seborrheic dermatitis, other. Rash is less flared today but persistent. Prescribe ketoconazole cream for the affected area. Continue using cocoa butter for moisturizing. Take a picture of the rash for future comparison.consider triamcinolone  cream 0.1% if no improvement is seen with ketoconazole cream after two weeks.  Atrial fibrillation   He remains in atrial fibrillation, experiencing fatigue and heart racing with exertion. Concern about undergoing cardioversion again. Discuss atrial fibrillation management options with family, including potential cardioversion.  Benign prostatic hyperplasia with lower urinary tract symptoms   He has been on tamsulosin  for a long time. No recent urologist visit, which may be why refills were not provided. Contact urologist to determine if a follow-up appointment is needed. Ensure tamsulosin  prescription is up  to date.    Return in  about 3 months (around 11/06/2023) for chronic follow-up.  Jenkins CHRISTELLA Carrel, MD

## 2023-08-08 ENCOUNTER — Ambulatory Visit: Payer: Self-pay | Admitting: Family Medicine

## 2023-08-08 DIAGNOSIS — E0590 Thyrotoxicosis, unspecified without thyrotoxic crisis or storm: Secondary | ICD-10-CM

## 2023-08-08 DIAGNOSIS — E876 Hypokalemia: Secondary | ICD-10-CM

## 2023-08-08 MED ORDER — POTASSIUM CHLORIDE CRYS ER 20 MEQ PO TBCR: 40.0000 meq | EXTENDED_RELEASE_TABLET | Freq: Two times a day (BID) | ORAL | 3 refills | Status: DC

## 2023-08-08 NOTE — Progress Notes (Signed)
 Needs to take B12 as we discussed Potassium still low-increase to 2 tabs twice daily.  Repeat bmp 1 wk Thyroid  hyper.  Need to refer to endo, do more labs and order u/s thyroid .  If heart racing, let me know  Lmam 1145 on 8/3 that need to discuss

## 2023-08-12 ENCOUNTER — Other Ambulatory Visit (INDEPENDENT_AMBULATORY_CARE_PROVIDER_SITE_OTHER)

## 2023-08-12 DIAGNOSIS — E0590 Thyrotoxicosis, unspecified without thyrotoxic crisis or storm: Secondary | ICD-10-CM

## 2023-08-12 DIAGNOSIS — E876 Hypokalemia: Secondary | ICD-10-CM | POA: Diagnosis not present

## 2023-08-12 LAB — BASIC METABOLIC PANEL WITH GFR
BUN: 12 mg/dL (ref 6–23)
CO2: 27 meq/L (ref 19–32)
Calcium: 8.9 mg/dL (ref 8.4–10.5)
Chloride: 102 meq/L (ref 96–112)
Creatinine, Ser: 0.76 mg/dL (ref 0.40–1.50)
GFR: 88.15 mL/min (ref >60.00)
Glucose, Bld: 96 mg/dL (ref 70–99)
Potassium: 3.3 meq/L — ABNORMAL LOW (ref 3.5–5.1)
Sodium: 144 meq/L (ref 135–145)

## 2023-08-12 LAB — MAGNESIUM: Magnesium: 1.6 mg/dL (ref 1.5–2.5)

## 2023-08-13 ENCOUNTER — Encounter: Payer: Self-pay | Admitting: Endocrinology

## 2023-08-13 ENCOUNTER — Ambulatory Visit (INDEPENDENT_AMBULATORY_CARE_PROVIDER_SITE_OTHER): Admitting: Endocrinology

## 2023-08-13 VITALS — BP 110/80 | HR 70 | Resp 20 | Ht 76.0 in | Wt 275.6 lb

## 2023-08-13 DIAGNOSIS — E0500 Thyrotoxicosis with diffuse goiter without thyrotoxic crisis or storm: Secondary | ICD-10-CM | POA: Diagnosis not present

## 2023-08-13 MED ORDER — METHIMAZOLE 5 MG PO TABS: 5.0000 mg | ORAL_TABLET | Freq: Every day | ORAL | 0 refills | Status: DC

## 2023-08-13 MED ORDER — METHIMAZOLE 5 MG PO TABS: 10.0000 mg | ORAL_TABLET | Freq: Every day | ORAL | 3 refills | Status: DC

## 2023-08-13 NOTE — Progress Notes (Signed)
 Outpatient Endocrinology Note Brandon Hayzen Lorenson, MD   Patient's Name: Brandon [name].    DOB: 06/08/1950    MRN: [id number]  REASON OF VISIT: New consult for hyperthyroidism  REFERRING PROVIDER:   PCP: Wendolyn Jenkins Jansky, MD  HISTORY OF PRESENT ILLNESS:   Brandon [name]. is a 73 y.o. old male with past medical history as listed below is presented for new consult for hyperthyroidism.   Pertinent Thyroid  History: Patient is referred to endocrinology for evaluation and management of hyperthyroidism.    Patient has history of paroxysmal atrial fibrillation for several years, 5-7 years.  He had routine TSH checked on July 07, 2023 was low 0.02, repeat thyroid  function test on August.  2025 was suppressed TSH with elevated free T3 5.4 And elevated free T4 of 1.82. On August 12, 2023, he had lab with elevated thyrotropin receptor antibody 2.32, consistent with having Graves' disease causing hyperthyroidism.  Lab result for thyroid  peroxidase antibody is awaited at this time.  Patient complains of mild diarrhea.  He occasionally gets palpitation and heat intolerance.  He has lost mild weight however he is also intentionally trying to lose weight.  Patient reports he has atrial fibrillation for 5 to 7 years status post ablation unsuccessful for several times.  He had normal thyroid  function test in the past as of April 2025 at that time TSH was 1.35.  He denies family history of thyroid  disorder.  He used to take amiodarone  for atrial fibrillation has been off of it since beginning of 2024.  No recent IV iodine  contrast use.  No recent illness.  He denies neck pain or any neck compressive symptoms.  No redness or watering of the eyes.  Labs:   Latest Reference Range & Units 05/04/23 10:18 07/07/23 10:42 08/06/23 11:03  TSH 0.35 - 5.50 uIU/mL 1.35 0.02 (L) <0.01 (L)  Triiodothyronine,Free,Serum 2.3 - 4.2 pg/mL   5.4 (H)  T4,Free(Direct) 0.60 - 1.60 ng/dL   8.17 (H)  (L): Data is abnormally low (H):  Data is abnormally high   Latest Reference Range & Units 08/12/23 09:11  Thyrotropin Receptor Ab 0.00 - 1.75 IU/L 2.32 (H)  (H): Data is abnormally high  Interval history  He presented today for evaluation and management of hyperthyroidism.   REVIEW OF SYSTEMS:  As per history of present illness.   PAST MEDICAL HISTORY: Past Medical History:  Diagnosis Date   Allergy 01/05/1664   Anxiety 05/12/1996   Arthritis 07/30/1993   Benign neoplasm of colon    Benign prostatic hyperplasia with urinary obstruction    Bilateral inguinal hernia 05/29/2011   Bilateral pulmonary embolism (HCC) 03/2012   admitted to [facility],  treated with Xarelto    Breast lump 03/29/2019   Calculus of kidney 10/21/2016   Cataract    Chronic bilateral low back pain with sciatica 03/21/2012   Chronic pain    Finger mass, right 10/17/2020   History of alcohol abuse 03/07/2016   History of colon polyps 11/18/2015   History of kidney stones    History of pulmonary embolism 03/19/2012   Hyperlipemia    Hypertension    Insomnia    Insomnia due to medical condition 12/23/2016   Polyuria secondary to BPH   Major depressive disorder, recurrent episode (HCC)    Mitral regurgitation 04/24/2013   Mild by TEE   Morbid obesity (HCC) 03/07/2016   Myocardial infarction (HCC) 02/21/2003   Nonrheumatic mitral valve insufficiency 04/25/2013   Mild by TEE  Overview:  Overview:  Mild by TEE Overview:  Overview:  Overview:  Mild by TEE Overview:  Overview:  Mild by TEE   Obesity    OSA (obstructive sleep apnea)    noncompliant with CPAP.  07/27/13- awaiting a CPAP- unable to tolerate mask   Osteoporosis    Persistent atrial fibrillation (HCC)    Recurrent unilateral inguinal hernia 07/17/2013   Restless leg syndrome    takes gabapentin    S/P right total knee arthroplasty 05/24/2018   Sciatica    Sleep apnea    Spinal stenosis, lumbar region with neurogenic claudication    SVT (supraventricular tachycardia) (HCC)  03/07/2016   Thrombus of left atrial appendage 03/09/2013   Uncomplicated asthma 06/20/2008   Overview:  Overview:  Overview:  Overview:  Qualifier: Diagnosis of  By: Krystal MD, Reyes LABOR Overview:  Overview:  Qualifier: Diagnosis of  By: Krystal MD, Reyes LABOR   Ventral hernia, unspecified, without mention of obstruction or gangrene    right abdominal wall    PAST SURGICAL HISTORY: Past Surgical History:  Procedure Laterality Date   ATRIAL FIBRILLATION ABLATION N/A 02/27/2022   Procedure: ATRIAL FIBRILLATION ABLATION;  Surgeon: Nancey Eulas BRAVO, MD;  Location: [facility] INVASIVE CV LAB;  Service: Cardiovascular;  Laterality: N/A;   CARDIOVERSION N/A 03/21/2012   Procedure: CARDIOVERSION;  Surgeon: Salena GORMAN Negri, MD;  Location: [facility] ENDOSCOPY;  Service: Cardiovascular;  Laterality: N/A;   CARDIOVERSION N/A 04/24/2013   Procedure: CARDIOVERSION;  Surgeon: Leim VEAR Moose, MD;  Location: [facility] ENDOSCOPY;  Service: Cardiovascular;  Laterality: N/A;   CARDIOVERSION N/A 06/27/2015   Procedure: CARDIOVERSION;  Surgeon: Ezra GORMAN Shuck, MD;  Location: [facility] ENDOSCOPY;  Service: Cardiovascular;  Laterality: N/A;   CARDIOVERSION N/A 11/11/2021   Procedure: CARDIOVERSION;  Surgeon: Pietro Redell GORMAN, MD;  Location: [facility] ENDOSCOPY;  Service: Cardiovascular;  Laterality: N/A;   CARDIOVERSION N/A 05/11/2022   Procedure: CARDIOVERSION;  Surgeon: Santo Stanly LABOR, MD;  Location: [facility] INVASIVE CV LAB;  Service: Cardiovascular;  Laterality: N/A;   CARDIOVERSION N/A 07/13/2022   Procedure: CARDIOVERSION;  Surgeon: Francyne Headland, MD;  Location: [facility]VASIVE CV LAB;  Service: Cardiovascular;  Laterality: N/A;   CATARACT EXTRACTION     COLONOSCOPY W/ POLYPECTOMY     ELECTROPHYSIOLOGIC STUDY N/A 07/30/2015   Procedure: Atrial Fibrillation Ablation;  Surgeon: Lynwood Rakers, MD;  Location: [facility] INVASIVE CV LAB;  Service: Cardiovascular;  Laterality: N/A;   EXTRACORPOREAL SHOCK WAVE LITHOTRIPSY Right 10/29/2016   Procedure:  RIGHT EXTRACORPOREAL SHOCK WAVE LITHOTRIPSY (ESWL);  Surgeon: Alvaro Hummer, MD;  Location: [facility] ORS;  Service: Urology;  Laterality: Right;   EYE SURGERY Right    cataract   FEMUR FRACTURE SURGERY     HERNIA REPAIR  07/06/2011   INGUINAL HERNIA REPAIR Right 07/28/2013   Procedure: RIGHT INGUINAL HERNIA REPAIR;  Surgeon: Donnice POUR. Belinda, MD;  Location: [facility] OR;  Service: General;  Laterality: Right;   INGUINAL HERNIA REPAIR Right 09/22/2016   Procedure: LAPAROSCOPIC RIGHT INGUINAL HERNIA;  Surgeon: Stevie Herlene Righter, MD;  Location: [facility] ORS;  Service: General;  Laterality: Right;  With MESH   INSERTION OF MESH Right 07/28/2013   Procedure: INSERTION OF MESH;  Surgeon: Donnice POUR. Belinda, MD;  Location: [facility] OR;  Service: General;  Laterality: Right;   JOINT REPLACEMENT  Both knees 2018   KNEE ARTHROSCOPY     left   REPLACEMENT TOTAL KNEE Left    ROTATOR CUFF REPAIR     right   ROTATOR CUFF REPAIR Left 03/08/2014  DR SUPPLE   SHOULDER ARTHROSCOPY WITH ROTATOR CUFF REPAIR AND SUBACROMIAL DECOMPRESSION Left 03/08/2014   Procedure: LEFT SHOULDER ARTHROSCOPY WITH ROTATOR CUFF REPAIR/SUBACROMIAL DECOMPRESSION/DISTAL CLAVICLE RESECTION;  Surgeon: Franky CHRISTELLA Pointer, MD;  Location: [facility] OR;  Service: Orthopedics;  Laterality: Left;   SVT ABLATION N/A 01/23/2019   Procedure: SVT ABLATION;  Surgeon: Waddell Danelle ORN, MD;  Location: [facility] INVASIVE CV LAB;  Service: Cardiovascular;  Laterality: N/A;   TEE WITHOUT CARDIOVERSION N/A 03/21/2012   Procedure: TRANSESOPHAGEAL ECHOCARDIOGRAM (TEE);  Surgeon: Salena GORMAN Negri, MD;  Location: [facility] ENDOSCOPY;  Service: Cardiovascular;  Laterality: N/A;   TEE WITHOUT CARDIOVERSION N/A 04/24/2013   Procedure: TRANSESOPHAGEAL ECHOCARDIOGRAM (TEE);  Surgeon: Leim VEAR Moose, MD;  Location: [facility] ENDOSCOPY;  Service: Cardiovascular;  Laterality: N/A;   TEE WITHOUT CARDIOVERSION N/A 07/29/2015   Procedure: TRANSESOPHAGEAL ECHOCARDIOGRAM (TEE);  Surgeon: Vina LULLA Gull, MD;  Location: [facility]  ENDOSCOPY;  Service: Cardiovascular;  Laterality: N/A;   TEE WITHOUT CARDIOVERSION N/A 05/11/2022   Procedure: TRANSESOPHAGEAL ECHOCARDIOGRAM;  Surgeon: Santo Stanly LABOR, MD;  Location: [facility] INVASIVE CV LAB;  Service: Cardiovascular;  Laterality: N/A;   TONSILLECTOMY     TOTAL KNEE ARTHROPLASTY Right 05/24/2018   Procedure: RIGHT TOTAL KNEE ARTHROPLASTY;  Surgeon: Ernie Cough, MD;  Location: [facility] ORS;  Service: Orthopedics;  Laterality: Right;  70 mins    ALLERGIES: Allergies  Allergen Reactions   Ace Inhibitors Cough   Albuterol  Other (See Comments)    Racing heart    FAMILY HISTORY:  Family History  Problem Relation Age of Onset   Cancer Father        bone   Heart failure Mother    Hypertension Mother    Asthma Mother    Cancer Paternal Grandmother        ovarian   CVA Other        Fam Hx of multiple myeloma   Diabetes Other        Fam Hx of DM    SOCIAL HISTORY: Social History   Socioeconomic History   Marital status: Married    Spouse name: Not on file   Number of children: 2   Years of education: college   Highest education level: Bachelor's degree (e.g., BA, AB, BS)  Occupational History   Occupation: RETIRED  Tobacco Use   Smoking status: Former    Current packs/day: 0.00    Average packs/day: 0.5 packs/day for 5.0 years (2.5 ttl pk-yrs)    Types: Cigarettes    Start date: 3    Quit date: 1976    Years since quitting: 49.6   Smokeless tobacco: Never   Tobacco comments:    Former smoker 03/27/22  Vaping Use   Vaping status: Never Used  Substance and Sexual Activity   Alcohol use: No    Comment: Former EtOH abuse, stopped 06/2016   Drug use: Yes    Types: Marijuana    Comment: negative hx for IV drug abuse   Sexual activity: Not on file  Other Topics Concern   Not on file  Social History Narrative   Lives alone in [place]   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   Prior [employer]   Wife lives in town (still married but lives separate)   Has  biological daughter in [place] ([place]),  2 steps-1in [place] and 1 in [place])   Social Drivers of Health   Financial Resource Strain: Low Risk  (07/03/2023)   Overall Financial Resource Strain (CARDIA)    Difficulty of Paying Living Expenses:  Not hard at all  Food Insecurity: No Food Insecurity (07/03/2023)   Hunger Vital Sign    Worried About Running Out of Food in the Last Year: Never true    Ran Out of Food in the Last Year: Never true  Transportation Needs: No Transportation Needs (07/03/2023)   PRAPARE - Administrator, Civil Service (Medical): No    Lack of Transportation (Non-Medical): No  Physical Activity: Inactive (07/03/2023)   Exercise Vital Sign    Days of Exercise per Week: 0 days    Minutes of Exercise per Session: Not on file  Stress: Stress Concern Present (07/03/2023)   [employer] of Occupational Health - Occupational Stress Questionnaire    Feeling of Stress: To some extent  Social Connections: Socially Isolated (07/03/2023)   Social Connection and Isolation Panel    Frequency of Communication with Friends and Family: Never    Frequency of Social Gatherings with Friends and Family: Never    Attends Religious Services: Never    Database administrator or Organizations: No    Attends Engineer, structural: Not on file    Marital Status: Separated    MEDICATIONS:  Current Outpatient Medications  Medication Sig Dispense Refill   amLODipine  (NORVASC ) 5 MG tablet Take 5 mg by mouth daily.     apixaban  (ELIQUIS ) 5 MG TABS tablet Take 1 tablet (5 mg total) by mouth 2 (two) times daily. 60 tablet 5   atorvastatin  (LIPITOR) 40 MG tablet Take 1 tablet (40 mg total) by mouth daily. 90 tablet 1   baclofen  (LIORESAL ) 10 MG tablet TAKE 0.5-1 TABLETS (5-10 MG TOTAL) BY MOUTH EVERY 8 (EIGHT) HOURS AS NEEDED FOR MUSCLE SPASMS. 180 tablet 1   buPROPion  (WELLBUTRIN  XL) 150 MG 24 hr tablet TAKE 1 TABLET BY MOUTH EVERY DAY 90 tablet 1   carvedilol  (COREG ) 25 MG  tablet Take 1 tablet (25 mg total) by mouth 2 (two) times daily with a meal. 180 tablet 3   escitalopram  (LEXAPRO ) 20 MG tablet Take 1 tablet (20 mg total) by mouth daily. 90 tablet 1   fluticasone  (FLONASE ) 50 MCG/ACT nasal spray SPRAY 2 SPRAYS INTO EACH NOSTRIL EVERY DAY 48 mL 1   gabapentin  (NEURONTIN ) 800 MG tablet Take 1 tablet (800 mg total) by mouth at bedtime. 90 tablet 6   ketoconazole  (NIZORAL ) 2 % cream Apply 1 Application topically 2 (two) times daily. 60 g 0   memantine  (NAMENDA ) 10 MG tablet Take 1 tablet (10 mg total) by mouth 2 (two) times daily. 180 tablet 1   potassium chloride  SA (KLOR-CON  M) 20 MEQ tablet Take 2 tablets (40 mEq total) by mouth 2 (two) times daily. 120 tablet 3   tamsulosin  (FLOMAX ) 0.4 MG CAPS capsule Take 1 capsule (0.4 mg total) by mouth daily. TAKE 1 CAPSULE BY MOUTH EVERY DAY. Patient to follow up with PCP prior to future refills 90 capsule 3   methimazole  (TAPAZOLE ) 5 MG tablet Take 2 tablets (10 mg total) by mouth daily. 180 tablet 3   No current facility-administered medications for this visit.    PHYSICAL EXAM: Vitals:   08/13/23 1114  BP: 110/80  Pulse: 70  Resp: 20  SpO2: 95%  Weight: 275 lb 9.6 oz (125 kg)  Height: 6' 4 (1.93 m)   Body mass index is 33.55 kg/m.  Wt Readings from Last 3 Encounters:  08/13/23 275 lb 9.6 oz (125 kg)  08/06/23 277 lb 4 oz (125.8 kg)  07/07/23 289  lb 4 oz (131.2 kg)    General: Well developed, well nourished male in no apparent distress.  HEENT: AT/[place], no external lesions. Hearing intact to the spoken word Eyes: EOMI. No stare, proptosis or lid lag. Conjunctiva clear and no icterus. No erythema or watering Neck: Trachea midline, neck supple without appreciable thyromegaly or lymphadenopathy and no palpable thyroid  nodules Lungs: Clear to auscultation, no wheeze. Respirations not labored Heart: S1S2, irregular in rate and rhythm. Abdomen: Soft, non tender, non distended Neurologic: Alert, oriented,  normal speech, deep tendon biceps reflexes normal,  no gross focal neurological deficit Extremities: No pedal pitting edema, no tremors of outstretched hands Skin: Warm, color good.  Psychiatric: Does not appear depressed or anxious  PERTINENT HISTORIC LABORATORY AND IMAGING STUDIES:  All pertinent laboratory results were reviewed. Please see HPI also for further details.   TSH  Date Value Ref Range Status  08/06/2023 <0.01 (L) 0.35 - 5.50 uIU/mL Final  07/07/2023 0.02 (L) 0.35 - 5.50 uIU/mL Final  05/04/2023 1.35 0.35 - 5.50 uIU/mL Final    Lab Results  Component Value Date   FREET4 1.82 (H) 08/06/2023   FREET4 1.2 06/05/2015   T3FREE 5.4 (H) 08/06/2023   T3FREE 3.0 02/22/2014   TSH <0.01 (L) 08/06/2023   TSH 0.02 (L) 07/07/2023   TSH 1.35 05/04/2023    Lab Results  Component Value Date   THYROTRECAB 2.32 (H) 08/12/2023    Lab Results  Component Value Date   TSH <0.01 (L) 08/06/2023   TSH 0.02 (L) 07/07/2023   TSH 1.35 05/04/2023   FREET4 1.82 (H) 08/06/2023   FREET4 1.2 06/05/2015     No results found for: TSI   No components found for: TRAB    ASSESSMENT / PLAN  1. Graves disease     The hyperthyroidism is most likely due to Graves disease. Patient has thyrotropin receptor antibody positive.    The three options of therapy for hyperthyroidism were discussed with the patient, including thionamide drug therapy, thyroidectomy, and radioactive iodine  ablation. I reviewed the possible complications of rash, agranulocytosis, or liver dysfunction associated with anti-thyroid  therapies. I reviewed the risks of hemorrhage, hypocalcemia, hoarseness and hypothyroidism after thyroidectomy. Regarding radioactive iodine  ablation, I informed the patient that most patients treated with radioactive iodine  can be cured of hyperthyroidism with a single dose, but to about 15-20% may require an additional dose. I emphasized that most patients treated with radioactive iodine  develop  permanent hypothyroidism, requiring lifelong replacement with thyroid  hormone. I discussed the rare occurrence of transient increase in thyroid  hormone levels after radioactive iodine  and associated with symptoms, possible worsening of Grave's eye disease, and the likely small but not insignificant risks associated with radiation exposure of this kind.   Will treat with antithyroid medication at this time.Okay to be on antithyroid medication at this time to see if spontaneous resolution occurs.   Discussed that especially with having atrial fibrillation it is important to keep thyroid  hormone levels in the normal range.  Overactive thyroid  can aggravate atrial fibrillation.  Plan: - Start methimazole  10 mg daily.  Discussed potential side effects and provided written instructions.  I also provided ATA handout about Graves' disease. - He is currently on carvedilol  for atrial fibrillation. - He needs close endocrinology follow-up. - Check thyroid  function test in 6 weeks.    Diagnoses and all orders for this visit:  Graves disease -     methimazole  (TAPAZOLE ) 5 MG tablet; Take 2 tablets (10 mg total) by mouth daily. -  T4, free -     TSH -     T3, free  Other orders -     Discontinue: methimazole  (TAPAZOLE ) 5 MG tablet; Take 1 tablet (5 mg total) by mouth daily.    DISPOSITION Follow up in clinic in 6-7 months suggested.  All questions answered and patient verbalized understanding of the plan.  Brandon Jayline Kilburg, MD [facility] Endocrinology [facility] [address] [place], [address] [place], [place] [address] Phone # [phone]   At least part of this note was generated using voice recognition software. Inadvertent word errors may have occurred, which were not recognized during the proofreading process.

## 2023-08-13 NOTE — Patient Instructions (Signed)
Methimazole Tablets What is this medication? METHIMAZOLE (meth IM a zole) treats high thyroid levels (hyperthyroidism) in your body. It works by decreasing the amount of thyroid hormone your body makes. This medicine may be used for other purposes; ask your health care provider or pharmacist if you have questions. COMMON BRAND NAME(S): Northyx, Tapazole What should I tell my care team before I take this medication? They need to know if you have any of these conditions: Liver disease Low white blood cell levels An unusual or allergic reaction to methimazole, other medications, foods, dyes, or preservatives Pregnant or trying to get pregnant Breastfeeding How should I use this medication? Take this medication by mouth with a glass of water. Follow the directions on the prescription label. You can take this medication with or without food. However, you should always take it the same way to make sure the effects are the same. Take your doses at regular intervals. Do not take your medication more often than directed. Do not stop taking this medication except on the advice of your care team. Talk to your care team about the use of this medication in children. Special care may be needed. While this medication may be prescribed for children for selected conditions, precautions do apply. Overdosage: If you think you have taken too much of this medicine contact a poison control center or emergency room at once. NOTE: This medicine is only for you. Do not share this medicine with others. What if I miss a dose? If you miss a dose, take it as soon as you can. If it is almost time for your next dose, take only that dose. Do not take double or extra doses. What may interact with this medication? Aminophylline Certain medications for blood pressure, heart disease, or irregular heartbeat, such as metoprolol and propranolol Digoxin Theophylline Warfarin This list may not describe all possible interactions.  Give your health care provider a list of all the medicines, herbs, non-prescription drugs, or dietary supplements you use. Also tell them if you smoke, drink alcohol, or use illegal drugs. Some items may interact with your medicine. What should I watch for while using this medication? Visit your care team for regular checks on your progress. Tell your care team if your symptoms do not start to get better or if they get worse. It may be some time before you see the benefit from this medication. You may need blood work done while you are taking this medication. Biotin (vitamin B7) may interfere with your thyroid function test. Stop taking supplements that contain biotin 2 days before your blood work. This medication may increase your risk of getting an infection. Call your care team for advice if you get a fever, chills, sore throat, or other symptoms of a cold or flu. Do not treat yourself. Try to avoid being around people who are sick. Talk to your care team if you may be pregnant. Serious birth defects can occur if you take this medication during pregnancy. If you are going to need surgery or a procedure, tell your care team that you are taking this medication. What side effects may I notice from receiving this medication? Side effects that you should report to your care team as soon as possible: Allergic reactions--skin rash, itching, hives, swelling of the face, lips, tongue, or throat Infection--fever, chills, cough, or sore throat Liver injury--right upper belly pain, loss of appetite, nausea, light-colored stool, dark yellow or brown urine, yellowing skin or eyes, unusual weakness or fatigue Low  thyroid levels (hypothyroidism)--unusual weakness or fatigue, increased sensitivity to cold, constipation, hair loss, dry skin, weight gain, feelings of depression Unusual weakness or fatigue, fever, headache, skin rash, muscle or joint pain, loss of appetite, pain, tingling, or numbness in the hands or  feet Side effects that usually do not require medical attention (report to your care team if they continue or are bothersome): Change in taste Dizziness Hair loss Headache Nausea Upset stomach This list may not describe all possible side effects. Call your doctor for medical advice about side effects. You may report side effects to FDA at 1-800-FDA-1088. Where should I keep my medication? Keep out of the reach of children. Store at room temperature between 20 and 25 degrees C (68 and 77 degrees F). Keep tightly closed. Protect from light. Throw away any unused medication after the expiration date. NOTE: This sheet is a summary. It may not cover all possible information. If you have questions about this medicine, talk to your doctor, pharmacist, or health care provider.  2024 Elsevier/Gold Standard (2022-03-13 00:00:00)

## 2023-08-16 ENCOUNTER — Ambulatory Visit: Payer: Self-pay | Admitting: Family Medicine

## 2023-08-16 NOTE — Progress Notes (Signed)
 Magnesium  a little low-take 400mg  magnesium  oxide otc Potassium still a little low-is he vomiting or diarrhea?  Still drinking alcohol?  Still using THC?

## 2023-08-17 ENCOUNTER — Encounter: Attending: Physical Medicine & Rehabilitation | Admitting: Physical Medicine & Rehabilitation

## 2023-08-17 ENCOUNTER — Encounter: Payer: Self-pay | Admitting: Physical Medicine & Rehabilitation

## 2023-08-17 VITALS — BP 108/77 | HR 64 | Ht 76.0 in | Wt 279.2 lb

## 2023-08-17 DIAGNOSIS — M48062 Spinal stenosis, lumbar region with neurogenic claudication: Secondary | ICD-10-CM | POA: Diagnosis not present

## 2023-08-17 DIAGNOSIS — M47816 Spondylosis without myelopathy or radiculopathy, lumbar region: Secondary | ICD-10-CM | POA: Diagnosis not present

## 2023-08-17 DIAGNOSIS — G894 Chronic pain syndrome: Secondary | ICD-10-CM | POA: Insufficient documentation

## 2023-08-17 DIAGNOSIS — Z79891 Long term (current) use of opiate analgesic: Secondary | ICD-10-CM

## 2023-08-17 DIAGNOSIS — Z5181 Encounter for therapeutic drug level monitoring: Secondary | ICD-10-CM

## 2023-08-17 MED ORDER — GABAPENTIN 400 MG PO CAPS
400.0000 mg | ORAL_CAPSULE | Freq: Three times a day (TID) | ORAL | 2 refills | Status: DC
Start: 2023-08-17 — End: 2023-08-17

## 2023-08-17 MED ORDER — GABAPENTIN 400 MG PO CAPS: 400.0000 mg | ORAL_CAPSULE | Freq: Every day | ORAL | 2 refills | Status: DC

## 2023-08-17 NOTE — Progress Notes (Addendum)
 Subjective:    Patient ID: Brandon [name]., male    DOB: 02/08/1950, 73 y.o.   MRN: [id number]  HPI     Brandon [name]. is a 73 y.o. year old male  who  has a past medical history of Anxiety, Arthritis, Benign neoplasm of colon, Benign prostatic hyperplasia with urinary obstruction, Bilateral inguinal hernia (05/29/2011), Bilateral pulmonary embolism (HCC) (3/14), Breast lump (03/29/2019), Calculus of kidney (10/21/2016), Cataract, Chronic bilateral low back pain with sciatica (03/21/2012), Chronic pain, Finger mass, right (10/17/2020), History of alcohol abuse (03/07/2016), History of colon polyps (11/18/2015), History of kidney stones, History of pulmonary embolism (03/19/2012), Hyperlipemia, Hypertension, Insomnia, Insomnia due to medical condition (12/23/2016), Major depressive disorder, recurrent episode (HCC), Mitral regurgitation (04/24/2013), Morbid obesity (HCC) (03/07/2016), Nonrheumatic mitral valve insufficiency (04/25/2013), Obesity, OSA (obstructive sleep apnea), Osteoporosis, Persistent atrial fibrillation (HCC), Recurrent unilateral inguinal hernia (07/17/2013), Restless leg syndrome, S/P right total knee arthroplasty (05/24/2018), Sciatica, Spinal stenosis, lumbar region with neurogenic claudication, SVT (supraventricular tachycardia) (HCC) (03/07/2016), Thrombus of left atrial appendage (03/09/2013), Uncomplicated asthma (06/20/2008), and Ventral hernia, unspecified, without mention of obstruction or gangrene.   They are presenting to PM&R clinic as a new patient for pain management evaluation. They were referred by Dr. Joshua for treatment of lumbar spinal stenosis pain.  [name] reports progressive worsening pain in his lower back that radiates to his outer and inner thighs.  He says this pain has been worsening gradually over many years.  Pain is worsened with standing up and walking.  Pain has a squeezing quality and he says the pain worsens with each step when he is walking.  Pain is improved with  bending forward or sitting down.  He reports having a history of scoliosis.  He was previously followed by [facility].  Patient was also seen for several years by Dr. Eldonna.  He had a trial of spinal cord stimulator but appears it was not beneficial enough to where he wanted to have it implanted.    Patient reports multiple spinal injections by Dr. Eldonna and he thinks that injections that he had reduced his pain from several weeks.  He is not sure which injections ESI versus facet joint ablation provided him with the best relief.   Chart review indicates he had radiofrequency ablation of left L3-4, L4-5 and L5-S1 facet joints by Dr. Eldonna after having a double diagnostic medial branch block with more than 50% relief.  It does not appear he had a lot of relief from the RFA.   More recently he was seen at [facility] neurosurgery.  Patient reports neurosurgery suggested he do injections again and he wanted to do them in [place] versus doing them at [facility] due to distance.  Chart review indicates neurosurgery feels that possibly upper lumbar facet joints could be contributing to his pain and they suggested upper lumbar MBB and possible RFA.   Patient reports having leg length discrepancy and has a lift in his left shoe.   Patient reports he tries to stay active and tries to exercise in a pool when he can.  He used to like to swim when he was younger.   He uses a cane for balance but does not want to use a walker.   Red flag symptoms: No red flags for back pain endorsed in Hx or ROS   Medications tried: Topical medications denies , tried CBD ointment in the past  Nsaids - limited by Afib  Tylenol   - Helps a little  Opiates  Hydrocodone  and oxycodone - help a little, helps him.  He would like to use this only intermittently when his pain is very severe during acute exacerbations Gabapentin  /- Takes TID 800mg   TCAs  -denies  SNRIs  -does not recall   Other treatments: PT- helps while he is getting  it but results havent lasted  TENs unit -denies benefit Injections -ESI, RFA, spinal cord stimulator trial in past by Dr. Eldonna  Surgery denies prior spine surgery   Goals for pain management: To be more active and have decreased pain     Pain is going down his left leg to his knee    Prior UDS results:  Labs (Brief)          Component Value Date/Time    LABOPIA NEGATIVE 03/17/2012 0133    COCAINSCRNUR NEGATIVE 03/17/2012 0133    LABBENZ POSITIVE (A) 03/17/2012 0133    AMPHETMU NEGATIVE 03/17/2012 0133      Interval history 11/11/2073 Brandon [name] is here for follow-up regarding his chronic low back pain.  He had bilateral T12, L1, L2 medial branch blocks completed on 10/27/2022 by Dr. Carilyn.  Patient reports he did not have significant benefit after his procedure.  Patient reports he tried to go to aquatic therapy, however patient had incident with staff and left without being seen.  Pain overall unchanged since last visit.  He has not tried Lyrica  yet, has not picked up from the pharmacy.   Interval history 12/15/22 Patient is here for follow-up regarding his chronic back pain.  He continues to have significant back pain although he feels like it is improved since starting Lyrica .  Reports he is tolerating this medication well.  He continues to have pain during activities.  He is open to retrying aquatic therapy.  His activity continues to be limited, has a hard time walking into stores from the parking lot when he needs to go shopping or pick up something from the house.. Reports occasional dizziness when for standing up, this improves after a few steps.   Interval history 02/15/23 Brandon [name] is here for follow-up regarding his chronic back pain.  He reports that therapy is helping a lot and he feels like his function is much improved.  He is very happy with his treatment at [facility].  He continues to work with therapy. Patient reports his PCP has changed his medication back to  gabapentin  from Lyrica .  He feels like there was not much difference in his pain control and the gabapentin  was helping his RLS better.    Interval history 05/17/23 Brandon [name] is here for follow-up regarding his chronic back pain.  Reports aquatic therapy is helpful and he enjoyed working at [employer].  Back pain overall is doing better.  He has not yet started exercising in the pool on his own but would like to do so.  Patient recently moved to a new home and has been more busy due to this.  He also has treatment for A-fib scheduled.  Reports baclofen  in the past was beneficial for his back pain and did not cause any significant side effects.  Interval history 08/17/23 Brandon [name] reports for the past few weeks he has been having more limitations and difficulty due to chronic pain.   He continues to have pain in his lower back but largely his pain is in his thighs bilaterally.  Pain is worsened with standing and ambulation.  It limits the distance he can After standing and walking for  a while his thighs will become tingly and feel numb.  Reports he is able to exercise better when he is on the exercise bike.  Pain is improved with sitting down or bending over.  Patient has been taking gabapentin  800 mg at night that has been beneficial to his pain, he does not take it during the day currently.  He reports he took it in the past during the day without any significant side effects.  Patient reports he was recently diagnosed with Graves' disease.   Pain Inventory Average Pain 8 Pain Right Now 9 My pain is aching when walking or standing In the last 24 hours, has pain interfered with the following? General activity 9 Relation with others 9 Enjoyment of life 9 What TIME of day is your pain at its worst? daytime Sleep (in general) Poor  Pain is worse with: walking, standing Pain improves with: rest Relief from Meds: 0  Family History  Problem Relation Age of Onset   Cancer Father        bone   Heart  failure Mother    Hypertension Mother    Asthma Mother    Cancer Paternal Grandmother        ovarian   CVA Other        Fam Hx of multiple myeloma   Diabetes Other        Fam Hx of DM   Social History   Socioeconomic History   Marital status: Married    Spouse name: Not on file   Number of children: 2   Years of education: college   Highest education level: Bachelor's degree (e.g., BA, AB, BS)  Occupational History   Occupation: RETIRED  Tobacco Use   Smoking status: Former    Current packs/day: 0.00    Average packs/day: 0.5 packs/day for 5.0 years (2.5 ttl pk-yrs)    Types: Cigarettes    Start date: 106    Quit date: 1976    Years since quitting: 49.6   Smokeless tobacco: Never   Tobacco comments:    Former smoker 03/27/22  Vaping Use   Vaping status: Never Used  Substance and Sexual Activity   Alcohol use: No    Comment: Former EtOH abuse, stopped 06/2016   Drug use: Yes    Types: Marijuana    Comment: negative hx for IV drug abuse   Sexual activity: Not on file  Other Topics Concern   Not on file  Social History Narrative   Lives alone in [place]   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   Prior [employer]   Wife lives in town (still married but lives separate)   Has biological daughter in [place] ([place]),  2 steps-1in [place] and 1 in [place])   Social Drivers of Health   Financial Resource Strain: Low Risk  (07/03/2023)   Overall Financial Resource Strain (CARDIA)    Difficulty of Paying Living Expenses: Not hard at all  Food Insecurity: No Food Insecurity (07/03/2023)   Hunger Vital Sign    Worried About Running Out of Food in the Last Year: Never true    Ran Out of Food in the Last Year: Never true  Transportation Needs: No Transportation Needs (07/03/2023)   PRAPARE - Administrator, Civil Service (Medical): No    Lack of Transportation (Non-Medical): No  Physical Activity: Inactive (07/03/2023)   Exercise Vital Sign    Days of Exercise per Week: 0 days     Minutes of Exercise per Session:  Not on file  Stress: Stress Concern Present (07/03/2023)   [employer] of Occupational Health - Occupational Stress Questionnaire    Feeling of Stress: To some extent  Social Connections: Socially Isolated (07/03/2023)   Social Connection and Isolation Panel    Frequency of Communication with Friends and Family: Never    Frequency of Social Gatherings with Friends and Family: Never    Attends Religious Services: Never    Database administrator or Organizations: No    Attends Engineer, structural: Not on file    Marital Status: Separated   Past Surgical History:  Procedure Laterality Date   ATRIAL FIBRILLATION ABLATION N/A 02/27/2022   Procedure: ATRIAL FIBRILLATION ABLATION;  Surgeon: Nancey, Eulas BRAVO, MD;  [facility] INVASIVE [facility] LAB;  Service: Cardiovascular;  Laterality: N/A;   CARDIOVERSION N/A 03/21/2012   Procedure: CARDIOVERSION;  Surgeon: Salena GORMAN Negri, MD;  Location: [facility] ENDOSCOPY;  Service: Cardiovascular;  Laterality: N/A;   CARDIOVERSION N/A 04/24/2013   Procedure: CARDIOVERSION;  Surgeon: Leim VEAR Moose, MD;  Location: [facility] ENDOSCOPY;  Service: Cardiovascular;  Laterality: N/A;   CARDIOVERSION N/A 06/27/2015   Procedure: CARDIOVERSION;  Surgeon: Ezra GORMAN Shuck, MD;  Location: [facility] ENDOSCOPY;  Service: Cardiovascular;  Laterality: N/A;   CARDIOVERSION N/A 11/11/2021   Procedure: CARDIOVERSION;  Surgeon: Pietro Redell GORMAN, MD;  Location: [facility] ENDOSCOPY;  Service: Cardiovascular;  Laterality: N/A;   CARDIOVERSION N/A 05/11/2022   Procedure: CARDIOVERSION;  Surgeon: Santo Stanly LABOR, MD;  [facility];  Service: Cardiovascular;  Laterality: N/A;   CARDIOVERSION N/A 07/13/2022   Procedure: CARDIOVERSION;  Surgeon: Francyne Headland, MD;  [facility];  Service: Cardiovascular;  Laterality: N/A;   CATARACT EXTRACTION     COLONOSCOPY W/ POLYPECTOMY     ELECTROPHYSIOLOGIC STUDY N/A 07/30/2015    Procedure: Atrial Fibrillation Ablation;  Surgeon: Lynwood Rakers, MD;  Location: [facility] INVASIVE CV LAB;  Service: Cardiovascular;  Laterality: N/A;   EXTRACORPOREAL SHOCK WAVE LITHOTRIPSY Right 10/29/2016   Procedure: RIGHT EXTRACORPOREAL SHOCK WAVE LITHOTRIPSY (ESWL);  Surgeon: Alvaro Hummer, MD;  Location: [facility] ORS;  Service: Urology;  Laterality: Right;   EYE SURGERY Right    cataract   FEMUR FRACTURE SURGERY     HERNIA REPAIR  07/06/2011   INGUINAL HERNIA REPAIR Right 07/28/2013   Procedure: RIGHT INGUINAL HERNIA REPAIR;  Surgeon: Donnice POUR. Belinda, MD;  Location: [facility] OR;  Service: General;  Laterality: Right;   INGUINAL HERNIA REPAIR Right 09/22/2016   Procedure: LAPAROSCOPIC RIGHT INGUINAL HERNIA;  Surgeon: Stevie Herlene Righter, MD;  Location: [facility] ORS;  Service: General;  Laterality: Right;  With MESH   INSERTION OF MESH Right 07/28/2013   Procedure: INSERTION OF MESH;  Surgeon: Donnice POUR. Tsuei, MD;  Location: [facility] OR;  Service: General;  Laterality: Right;   JOINT REPLACEMENT  Both knees 2018   KNEE ARTHROSCOPY     left   REPLACEMENT TOTAL KNEE Left    ROTATOR CUFF REPAIR     right   ROTATOR CUFF REPAIR Left 03/08/2014   DR SUPPLE   SHOULDER ARTHROSCOPY WITH ROTATOR CUFF REPAIR AND SUBACROMIAL DECOMPRESSION Left 03/08/2014   Procedure: LEFT SHOULDER ARTHROSCOPY WITH ROTATOR CUFF REPAIR/SUBACROMIAL DECOMPRESSION/DISTAL CLAVICLE RESECTION;  Surgeon: Franky CHRISTELLA Pointer, MD;  Location: [facility] OR;  Service: Orthopedics;  Laterality: Left;   SVT ABLATION N/A 01/23/2019   Procedure: SVT ABLATION;  Surgeon: Waddell Danelle ORN, MD;  Location: [facility] INVASIVE CV LAB;  Service: Cardiovascular;  Laterality: N/A;   TEE  WITHOUT CARDIOVERSION N/A 03/21/2012   Procedure: TRANSESOPHAGEAL ECHOCARDIOGRAM (TEE);  Surgeon: Salena GORMAN Negri, MD;  Location: [facility] ENDOSCOPY;  Service: Cardiovascular;  Laterality: N/A;   TEE WITHOUT CARDIOVERSION N/A 04/24/2013   Procedure: TRANSESOPHAGEAL ECHOCARDIOGRAM (TEE);  Surgeon: Leim VEAR Moose, MD;  Location: [facility] ENDOSCOPY;  Service: Cardiovascular;  Laterality: N/A;   TEE WITHOUT CARDIOVERSION N/A 07/29/2015   Procedure: TRANSESOPHAGEAL ECHOCARDIOGRAM (TEE);  Surgeon: Vina LULLA Gull, MD;  Location: [facility] ENDOSCOPY;  Service: Cardiovascular;  Laterality: N/A;   TEE WITHOUT CARDIOVERSION N/A 05/11/2022   Procedure: TRANSESOPHAGEAL ECHOCARDIOGRAM;  Surgeon: Santo Stanly LABOR, MD;  [facility];  Service: Cardiovascular;  Laterality: N/A;   TONSILLECTOMY     TOTAL KNEE ARTHROPLASTY Right 05/24/2018   Procedure: RIGHT TOTAL KNEE ARTHROPLASTY;  Surgeon: Ernie Cough, MD;  [facility]: [facility];  Service: Orthopedics;  Laterality: Right;  70 mins   Past Surgical History:  Procedure Laterality Date   ATRIAL FIBRILLATION ABLATION N/A 02/27/2022   Procedure: ATRIAL FIBRILLATION ABLATION;  Surgeon: Nancey Eulas BRAVO, MD;  [facility];  Service: Cardiovascular;  Laterality: N/A;   CARDIOVERSION N/A 03/21/2012   Procedure: CARDIOVERSION;  Surgeon: Salena GORMAN Negri, MD;  [facility]OS[facility];  Service: Cardiovascular;  Laterality: N/A;   CARDIOVERSION N/A 04/24/2013   Procedure: CARDIOVERSION;  Surgeon: Leim VEAR Moose, MD;  Location: [facility] ENDOSCOPY;  Service: Cardiovascular;  Laterality: N/A;   CARDIOVERSION N/A 06/27/2015   Procedure: CARDIOVERSION;  Surgeon: Ezra GORMAN Shuck, MD;  Location: [facility] ENDOSCOPY;  Service: Cardiovascular;  Laterality: N/A;   CARDIOVERSION N/A 11/11/2021   Procedure: CARDIOVERSION;  Surgeon: Pietro Redell GORMAN, MD;  Location: [facility] ENDOSCOPY;  Service: Cardiovascular;  Laterality: N/A;   CARDIOVERSION N/A 05/11/2022   Procedure: CARDIOVERSION;  Surgeon: Santo Stanly LABOR, MD;  [facility];  Service: Cardiovascular;  Laterality: N/A;   CARDIOVERSION N/A 07/13/2022   Procedure: CARDIOVERSION;  Surgeon: Francyne Headland, MD;  [facility];  Service: Cardiovascular;  Laterality: N/A;   CATARACT  EXTRACTION     COLONOSCOPY W/ POLYPECTOMY     ELECTROPHYSIOLOGIC STUDY N/A 07/30/2015   Procedure: Atrial Fibrillation Ablation;  Surgeon: Lynwood Rakers, MD;  Location: [facility] INVASIVE CV LAB;  Service: Cardiovascular;  Laterality: N/A;   EXTRACORPOREAL SHOCK WAVE LITHOTRIPSY Right 10/29/2016   Procedure: RIGHT EXTRACORPOREAL SHOCK WAVE LITHOTRIPSY (ESWL);  Surgeon: Alvaro Hummer, MD;  Location: [facility];  Service: Urology;  Laterality: Right;   EYE SURGERY Right    cataract   FEMUR FRACTURE SURGERY     HERNIA REPAIR  07/06/2011   INGUINAL HERNIA REPAIR Right 07/28/2013   Procedure: RIGHT INGUINAL HERNIA REPAIR;  Surgeon: Cough POUR. Belinda, MD;  [facility];  Service: General;  Laterality: Right;   INGUINAL HERNIA REPAIR Right 09/22/2016   Procedure: LAPAROSCOPIC RIGHT INGUINAL HERNIA;  Surgeon: Stevie Herlene Righter, MD;  Location: [facility];  Service: General;  Laterality: Right;  With MESH   INSERTION OF MESH Right 07/28/2013   Procedure: INSERTION OF MESH;  Surgeon: Cough POUR. Tsuei, MD;  [facility];  Service: General;  Laterality: Right;   JOINT REPLACEMENT  Both knees 2018   KNEE ARTHROSCOPY     left   REPLACEMENT TOTAL KNEE Left    ROTATOR CUFF REPAIR     right   ROTATOR CUFF REPAIR Left 03/08/2014   DR SUPPLE   SHOULDER ARTHROSCOPY WITH ROTATOR CUFF REPAIR AND SUBACROMIAL DECOMPRESSION Left 03/08/2014   Procedure: LEFT SHOULDER ARTHROSCOPY WITH ROTATOR  CUFF REPAIR/SUBACROMIAL DECOMPRESSION/DISTAL CLAVICLE RESECTION;  Surgeon: Franky CHRISTELLA Pointer, MD;  [facility];  Service: Orthopedics;  Laterality: Left;   SVT ABLATION N/A 01/23/2019   Procedure: SVT ABLATION;  Surgeon: Waddell Danelle ORN, MD;  Location: [facility] INVASIVE CV LAB;  Service: Cardiovascular;  Laterality: N/A;   TEE WITHOUT CARDIOVERSION N/A 03/21/2012   Procedure: TRANSESOPHAGEAL ECHOCARDIOGRAM (TEE);  Surgeon: Salena GORMAN Negri, MD;  Location: [facility] ENDOSCOPY;  Service: Cardiovascular;  Laterality: N/A;   TEE WITHOUT CARDIOVERSION  N/A 04/24/2013   Procedure: TRANSESOPHAGEAL ECHOCARDIOGRAM (TEE);  Surgeon: Leim VEAR Moose, MD;  Location: [facility] ENDOSCOPY;  Service: Cardiovascular;  Laterality: N/A;   TEE WITHOUT CARDIOVERSION N/A 07/29/2015   Procedure: TRANSESOPHAGEAL ECHOCARDIOGRAM (TEE);  Surgeon: Vina LULLA Gull, MD;  Location: [facility] ENDOSCOPY;  Service: Cardiovascular;  Laterality: N/A;   TEE WITHOUT CARDIOVERSION N/A 05/11/2022   Procedure: TRANSESOPHAGEAL ECHOCARDIOGRAM;  Surgeon: Santo Stanly LABOR, MD;  [facility];  Service: Cardiovascular;  Laterality: N/A;   TONSILLECTOMY     TOTAL KNEE ARTHROPLASTY Right 05/24/2018   Procedure: RIGHT TOTAL KNEE ARTHROPLASTY;  Surgeon: Ernie Cough, MD;  Location: [facility];  Service: Orthopedics;  Laterality: Right;  70 mins   Past Medical History:  Diagnosis Date   Allergy 01/05/1664   Anxiety 05/12/1996   Arthritis 07/30/1993   Benign neoplasm of colon    Benign prostatic hyperplasia with urinary obstruction    Bilateral inguinal hernia 05/29/2011   Bilateral pulmonary embolism (HCC) 03/2012   admitted to [facility],  treated with Xarelto    Breast lump 03/29/2019   Calculus of kidney 10/21/2016   Cataract    Chronic bilateral low back pain with sciatica 03/21/2012   Chronic pain    Finger mass, right 10/17/2020   History of alcohol abuse 03/07/2016   History of colon polyps 11/18/2015   History of kidney stones    History of pulmonary embolism 03/19/2012   Hyperlipemia    Hypertension    Insomnia    Insomnia due to medical condition 12/23/2016   Polyuria secondary to BPH   Major depressive disorder, recurrent episode (HCC)    Mitral regurgitation 04/24/2013   Mild by TEE   Morbid obesity (HCC) 03/07/2016   Myocardial infarction (HCC) 02/21/2003   Nonrheumatic mitral valve insufficiency 04/25/2013   Mild by TEE  Overview:  Overview:  Mild by TEE Overview:  Overview:  Overview:  Mild by TEE Overview:  Overview:  Mild by TEE   Obesity    OSA  (obstructive sleep apnea)    noncompliant with CPAP.  07/27/13- awaiting a CPAP- unable to tolerate mask   Osteoporosis    Persistent atrial fibrillation (HCC)    Recurrent unilateral inguinal hernia 07/17/2013   Restless leg syndrome    takes gabapentin    S/P right total knee arthroplasty 05/24/2018   Sciatica    Sleep apnea    Spinal stenosis, lumbar region with neurogenic claudication    SVT (supraventricular tachycardia) (HCC) 03/07/2016   Thrombus of left atrial appendage 03/09/2013   Uncomplicated asthma 06/20/2008   Overview:  Overview:  Overview:  Overview:  Qualifier: Diagnosis of  By: Krystal MD, Reyes LABOR Overview:  Overview:  Qualifier: Diagnosis of  By: Krystal MD, Reyes LABOR   Ventral hernia, unspecified, without mention of obstruction or gangrene    right abdominal wall   BP 108/77 (BP Location: Left Arm, Patient Position: Sitting, Cuff Size: Large)   Pulse 64   Ht 6' 4 (1.93 m)   Wt 279  lb 3.2 oz (126.6 kg)   SpO2 92%   BMI 33.99 kg/m   Opioid Risk Score:   Fall Risk Score:  `1  Depression screen [facility] 2/9     08/17/2023    9:24 AM 08/06/2023   11:02 AM 05/17/2023    9:14 AM 05/04/2023    9:44 AM 04/02/2023    9:57 AM 02/15/2023   10:35 AM 12/15/2022    9:48 AM  Depression screen PHQ 2/9  Decreased Interest 3 3 3 3 3  0 0  Down, Depressed, Hopeless 3 2 3 3 3  0 0  PHQ - 2 Score 6 5 6 6 6  0 0  Altered sleeping 0 3  3 1     Tired, decreased energy 3 2  3  0    Change in appetite 0 2  2 0    Feeling bad or failure about yourself  0 3  2 0    Trouble concentrating 0 2  2 0    Moving slowly or fidgety/restless 0 0  1 0    Suicidal thoughts 0 0  1 0    PHQ-9 Score 9 17  20 7     Difficult doing work/chores Extremely dIfficult Somewhat difficult  Extremely dIfficult         Review of Systems  Musculoskeletal:  Positive for back pain.       Back pain, Bilateral leg pain  All other systems reviewed and are negative.      Objective:   Physical Exam     08/17/2023     9:26 AM 08/13/2023   11:14 AM 08/06/2023   10:27 AM  Vitals with BMI  Height 6' 4 6' 4 6' 4  Weight 279 lbs 3 oz 275 lbs 10 oz 277 lbs 4 oz  BMI 34 33.56 33.76  Systolic 108 110 874  Diastolic 77 80 87  Pulse 64 70 64    Gen: no distress, normal appearing HEENT: oral mucosa pink and moist Chest: normal effort, normal rate of breathing Abd: soft, non-distended Ext: no edema Psych: Appropriate, pleasant Skin: intact Neuro: Alert and oriented, follows commands, cranial nerves II through XII grossly intact, normal speech and language RUE: 5/5 Deltoid, 5/5 Biceps, 5/5 Triceps, 5/5 Wrist Ext, 5/5 Grip LUE: 5/5 Deltoid, 5/5 Biceps, 5/5 Triceps, 5/5 Wrist Ext, 5/5 Grip RLE: HF 5/5, KE 5/5,ADF 5/5, APF 5/5 LLE: HF 5/5, KE 5/5, ADF 5/5, APF 5/5 Sensory exam normal for light touch and pain in all 4 limbs.  No ankle clonus   Musculoskeletal:  TTP  bilateral paraspinal muscles in the lumbar spine Kyphotic posture SLR negative Facet loading positive and  pain with spinal extension Walking with cane  Pushes up with arms to get out of chair   MRI from [facility] note 05/14/22 Lumbar MRI 05/13/22: Anatomical variants: none Alignment: Degenerative levoscoliosis. Conus medullaris: terminates at approximately L1. Spinal Cord and Cauda Equina: Visualized portions of the spinal cord is normal in morphology and signal. Normal appearance of the cauda equina.  Bone marrow signal: No suspicious lesions.  T12-L1: Mild disc and facet degenerative changes resulting in mild spinal canal stenosis.  L1-L2: Unremarkable.  L2-L3: Mild disc and facet degenerative changes without significant spinal canal or neural foraminal stenosis.  L3-L4: Disc bulge and facet arthropathy resulting in mild canal stenosis.  L4-L5: Mild disc and facet degenerative changes resulting in mild canal stenosis.  L5-S1: Advanced bilateral facet arthropathy and mild disc degenerative changes resulting in mild left neural foraminal  stenosis.  IMPRESSION: Multilevel disc and facet degenerative changes above without high-grade stenosis.    L spine Xray from [facility] note  Spine x-rays 04/01/22: IMPRESSION: 1. S-shaped scoliosis of the thoracolumbar spine with positive sagittal and coronal imbalance. 2. Mild stepwise retrolisthesis from L2 to L5 without evidence of dynamic instability. 3. 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       Assessment & Plan:   1) Chronic low back pain with degenerative spinal canal stenosis and multilevel lumbar facet joint degeneration   2) Depression, denies SI or HI. On Lexapro  20 mg daily  3) Dizziness when getting up intermittently.  Suspect orthostatic hypotension  4)Marijuanna product use     -Patient was previously started on Lyrica  100 mg 3 times daily.  PCP has restarted gabapentin , he feels like it was better for his RLS.  He did not notice a significant pain improvement with the Lyrica  over gabapentin .   -Continue gabapentin  800 mg at night but will also start gabapentin  400 mg in the morning. -Pt has degeneration in upper facet joints of the lumbar spine, consider medial branch block/RFA to upper lumbar spine.  Lower lumbar spine RFA was completed previously by Dr. Eldonna.  Patient was referred for medial branch block of upper lumbar spine.  Medial branch block bilateral T12, L1-L2 and this was completed on 10/24 without significant benefit reported. -Most recent symptoms appear most consistent with spinal stenosis.  Refer to Dr. Carilyn for L-spine ESI, patient indicated L-spine ESI was helpful in the past -Will recheck MRI L-spine, assess for worsening of his spinal stenosis -Prior visit I placed referral to psychiatry for medication management due to continued depression -Completed aquatic therapy-[facility]  was helpful  -Advised to continue exercise in pool- provided letter indicating he would benefit from this -Compression stockings  to prevent orthostatic hypotension symptoms - Previously was considering tramadol  THC noted on UDS.  Patient has been using over-the-counter marijuana based products. -Start baclofen  5-10mg  TID PRN  - Drug screen repeated today  Called pt to f/u 09/15/23 and discuss MRI, continue with current plan  Similar multilevel degenerative change, described above. No evidence of impingement.

## 2023-08-19 ENCOUNTER — Other Ambulatory Visit: Payer: Self-pay | Admitting: Family Medicine

## 2023-08-19 DIAGNOSIS — E876 Hypokalemia: Secondary | ICD-10-CM

## 2023-08-19 DIAGNOSIS — E269 Hyperaldosteronism, unspecified: Secondary | ICD-10-CM

## 2023-08-19 MED ORDER — EPLERENONE 25 MG PO TABS: 25.0000 mg | ORAL_TABLET | Freq: Every day | ORAL | 0 refills | Status: DC

## 2023-08-20 LAB — ANTI-TPO AB (RDL): Anti-TPO Ab (RDL): 91.1 [IU]/mL — ABNORMAL HIGH (ref <9.0)

## 2023-08-20 LAB — THYROTROPIN RECEPTOR AUTOABS: Thyrotropin Receptor Ab: 2.32 IU/L — ABNORMAL HIGH (ref 0.00–1.75)

## 2023-08-26 ENCOUNTER — Ambulatory Visit
Admission: RE | Admit: 2023-08-26 | Discharge: 2023-08-26 | Disposition: A | Discharge status: Home / Self Care | Source: Ambulatory Visit | Attending: Physical Medicine & Rehabilitation | Admitting: Physical Medicine & Rehabilitation

## 2023-08-26 DIAGNOSIS — M48061 Spinal stenosis, lumbar region without neurogenic claudication: Secondary | ICD-10-CM | POA: Diagnosis not present

## 2023-08-26 DIAGNOSIS — M48062 Spinal stenosis, lumbar region with neurogenic claudication: Secondary | ICD-10-CM

## 2023-08-26 DIAGNOSIS — M47816 Spondylosis without myelopathy or radiculopathy, lumbar region: Secondary | ICD-10-CM | POA: Diagnosis not present

## 2023-08-26 DIAGNOSIS — M5126 Other intervertebral disc displacement, lumbar region: Secondary | ICD-10-CM | POA: Diagnosis not present

## 2023-08-29 ENCOUNTER — Encounter: Payer: Self-pay | Admitting: Physical Medicine & Rehabilitation

## 2023-09-11 ENCOUNTER — Other Ambulatory Visit: Payer: Self-pay | Admitting: Family Medicine

## 2023-09-11 DIAGNOSIS — E269 Hyperaldosteronism, unspecified: Secondary | ICD-10-CM

## 2023-09-11 DIAGNOSIS — E876 Hypokalemia: Secondary | ICD-10-CM

## 2023-09-22 ENCOUNTER — Other Ambulatory Visit: Payer: Self-pay | Admitting: Cardiovascular Disease

## 2023-09-27 ENCOUNTER — Other Ambulatory Visit

## 2023-09-27 DIAGNOSIS — E0500 Thyrotoxicosis with diffuse goiter without thyrotoxic crisis or storm: Secondary | ICD-10-CM | POA: Diagnosis not present

## 2023-09-27 LAB — T3, FREE: T3, Free: 3 pg/mL (ref 2.3–4.2)

## 2023-09-27 LAB — T4, FREE: Free T4: 0.9 ng/dL (ref 0.8–1.8)

## 2023-09-27 LAB — TSH: TSH: 0.47 m[IU]/L (ref 0.40–4.50)

## 2023-09-28 ENCOUNTER — Ambulatory Visit: Payer: Self-pay | Admitting: Endocrinology

## 2023-10-01 ENCOUNTER — Ambulatory Visit (INDEPENDENT_AMBULATORY_CARE_PROVIDER_SITE_OTHER): Admitting: Endocrinology

## 2023-10-01 ENCOUNTER — Encounter: Payer: Self-pay | Admitting: Endocrinology

## 2023-10-01 VITALS — BP 120/70 | HR 88 | Resp 16 | Ht 76.0 in | Wt 281.6 lb

## 2023-10-01 DIAGNOSIS — E0500 Thyrotoxicosis with diffuse goiter without thyrotoxic crisis or storm: Secondary | ICD-10-CM

## 2023-10-01 MED ORDER — METHIMAZOLE 5 MG PO TABS: 10.0000 mg | ORAL_TABLET | Freq: Every day | ORAL | 3 refills | Status: DC

## 2023-10-01 NOTE — Progress Notes (Signed)
 Outpatient Endocrinology Note Brandon Alannie Amodio, MD   Patient's Name: Brandon [name].    DOB: Jul 25, 1950    MRN: [id number]  REASON OF VISIT: Follow-up for hyperthyroidism  REFERRING PROVIDER:   PCP: Wendolyn Jenkins Jansky, MD  HISTORY OF PRESENT ILLNESS:   Brandon Gruszka. is a 74 y.o. old male with past medical history as listed below is presented for follow-up for hyperthyroidism Danise' disease.   Pertinent Thyroid  History: Patient was referred to endocrinology for evaluation and management of hyperthyroidism.  Initial consult on August 13, 2023.  Patient has history of paroxysmal atrial fibrillation for several years, 5-7 years.  He had routine TSH checked on July 07, 2023 was low 0.02, repeat thyroid  function test on August.  2025 was suppressed TSH with elevated free T3 5.4 And elevated free T4 of 1.82. On August 12, 2023, he had lab with elevated thyrotropin receptor antibody 2.32, consistent with having Graves' disease causing hyperthyroidism.    At the initial visit, patient complains of mild diarrhea.  He occasionally gets palpitation and heat intolerance.  He has lost mild weight however he is also intentionally trying to lose weight.  Patient reports he has atrial fibrillation for 5 to 7 years status post ablation unsuccessful for several times.  He had normal thyroid  function test in the past as of April 2025 at that time TSH was 1.35.  He denies family history of thyroid  disorder.  He used to take amiodarone  for atrial fibrillation has been off of it since beginning of 2024.  No recent IV iodine  contrast use.  No recent illness.  He denies neck pain or any neck compressive symptoms.  No redness or watering of the eyes.  Labs:  Latest Reference Range & Units 08/12/23 09:11  Thyrotropin Receptor Ab 0.00 - 1.75 IU/L 2.32 (H)  (H): Data is abnormally high  Patient was started on methimazole  10 mg daily in August 2025.  Interval history  Patient is no longer having symptoms of  palpitation, heat intolerance or diarrhea.  Overall feeling good.  No new symptoms today.  He has been taking methimazole  10 mg daily.  He denies any illness or fever or any stomach issues.  Recent thyroid  function test normal as follows.   Latest Reference Range & Units 09/27/23 09:22  TSH 0.40 - 4.50 mIU/L 0.47  Triiodothyronine,Free,Serum 2.3 - 4.2 pg/mL 3.0  T4,Free(Direct) 0.8 - 1.8 ng/dL 0.9     REVIEW OF SYSTEMS:  As per history of present illness.   PAST MEDICAL HISTORY: Past Medical History:  Diagnosis Date   Allergy 01/05/1664   Anxiety 05/12/1996   Arthritis 07/30/1993   Benign neoplasm of colon    Benign prostatic hyperplasia with urinary obstruction    Bilateral inguinal hernia 05/29/2011   Bilateral pulmonary embolism (HCC) 03/2012   admitted to [facility],  treated with Xarelto    Breast lump 03/29/2019   Calculus of kidney 10/21/2016   Cataract    Chronic bilateral low back pain with sciatica 03/21/2012   Chronic pain    Finger mass, right 10/17/2020   History of alcohol abuse 03/07/2016   History of colon polyps 11/18/2015   History of kidney stones    History of pulmonary embolism 03/19/2012   Hyperlipemia    Hypertension    Insomnia    Insomnia due to medical condition 12/23/2016   Polyuria secondary to BPH   Major depressive disorder, recurrent episode    Mitral regurgitation 04/24/2013   Mild by TEE  Morbid obesity (HCC) 03/07/2016   Myocardial infarction (HCC) 02/21/2003   Nonrheumatic mitral valve insufficiency 04/25/2013   Mild by TEE  Overview:  Overview:  Mild by TEE Overview:  Overview:  Overview:  Mild by TEE Overview:  Overview:  Mild by TEE   Obesity    OSA (obstructive sleep apnea)    noncompliant with CPAP.  07/27/13- awaiting a CPAP- unable to tolerate mask   Osteoporosis    Persistent atrial fibrillation (HCC)    Recurrent unilateral inguinal hernia 07/17/2013   Restless leg syndrome    takes gabapentin    S/P right total knee  arthroplasty 05/24/2018   Sciatica    Sleep apnea    Spinal stenosis, lumbar region with neurogenic claudication    SVT (supraventricular tachycardia) 03/07/2016   Thrombus of left atrial appendage 03/09/2013   Uncomplicated asthma 06/20/2008   Overview:  Overview:  Overview:  Overview:  Qualifier: Diagnosis of  By: Krystal MD, Reyes LABOR Overview:  Overview:  Qualifier: Diagnosis of  By: Krystal MD, Reyes LABOR   Ventral hernia, unspecified, without mention of obstruction or gangrene    right abdominal wall    PAST SURGICAL HISTORY: Past Surgical History:  Procedure Laterality Date   ATRIAL FIBRILLATION ABLATION N/A 02/27/2022   Procedure: ATRIAL FIBRILLATION ABLATION;  Surgeon: Nancey Eulas BRAVO, MD;  Location: [facility] INVASIVE CV LAB;  Service: Cardiovascular;  Laterality: N/A;   CARDIOVERSION N/A 03/21/2012   Procedure: CARDIOVERSION;  Surgeon: Salena GORMAN Negri, MD;  Location: [facility] ENDOSCOPY;  Service: Cardiovascular;  Laterality: N/A;   CARDIOVERSION N/A 04/24/2013   Procedure: CARDIOVERSION;  Surgeon: Leim VEAR Moose, MD;  Location: [facility] ENDOSCOPY;  Service: Cardiovascular;  Laterality: N/A;   CARDIOVERSION N/A 06/27/2015   Procedure: CARDIOVERSION;  Surgeon: Ezra GORMAN Shuck, MD;  Location: [facility] ENDOSCOPY;  Service: Cardiovascular;  Laterality: N/A;   CARDIOVERSION N/A 11/11/2021   Procedure: CARDIOVERSION;  Surgeon: Pietro Redell GORMAN, MD;  Location: [facility] ENDOSCOPY;  Service: Cardiovascular;  Laterality: N/A;   CARDIOVERSION N/A 05/11/2022   Procedure: CARDIOVERSION;  Surgeon: Santo Stanly LABOR, MD;  Location: [facility];  Service: Cardiovascular;  Laterality: N/A;   CARDIOVERSION N/A 07/13/2022   Procedure: CARDIOVERSION;  Surgeon: Francyne Headland, MD;  [facility]: [facility];  Service: Cardiovascular;  Laterality: N/A;   CATARACT EXTRACTION     COLONOSCOPY W/ POLYPECTOMY     ELECTROPHYSIOLOGIC STUDY N/A 07/30/2015   Procedure: Atrial Fibrillation Ablation;  Surgeon: Lynwood Rakers, MD;  Location: [facility] INVASIVE CV LAB;  Service: Cardiovascular;  Laterality: N/A;   EXTRACORPOREAL SHOCK WAVE LITHOTRIPSY Right 10/29/2016   Procedure: RIGHT EXTRACORPOREAL SHOCK WAVE LITHOTRIPSY (ESWL);  Surgeon: Alvaro Hummer, MD;  Location: [facility] ORS;  Service: Urology;  Laterality: Right;   EYE SURGERY Right    cataract   FEMUR FRACTURE SURGERY     HERNIA REPAIR  07/06/2011   INGUINAL HERNIA REPAIR Right 07/28/2013   Procedure: RIGHT INGUINAL HERNIA REPAIR;  Surgeon: Donnice POUR. Belinda, MD;  Location: [facility] OR;  Service: General;  Laterality: Right;   INGUINAL HERNIA REPAIR Right 09/22/2016   Procedure: LAPAROSCOPIC RIGHT INGUINAL HERNIA;  Surgeon: Stevie Herlene Righter, MD;  Location: [facility] ORS;  Service: General;  Laterality: Right;  With MESH   INSERTION OF MESH Right 07/28/2013   Procedure: INSERTION OF MESH;  Surgeon: Donnice POUR. Belinda, MD;  Location: [facility] OR;  Service: General;  Laterality: Right;   JOINT REPLACEMENT  Both knees 2018   KNEE ARTHROSCOPY     left  REPLACEMENT TOTAL KNEE Left    ROTATOR CUFF REPAIR     right   ROTATOR CUFF REPAIR Left 03/08/2014   DR SUPPLE   SHOULDER ARTHROSCOPY WITH ROTATOR CUFF REPAIR AND SUBACROMIAL DECOMPRESSION Left 03/08/2014   Procedure: LEFT SHOULDER ARTHROSCOPY WITH ROTATOR CUFF REPAIR/SUBACROMIAL DECOMPRESSION/DISTAL CLAVICLE RESECTION;  Surgeon: Franky CHRISTELLA Pointer, MD;  Location: [facility] OR;  Service: Orthopedics;  Laterality: Left;   SVT ABLATION N/A 01/23/2019   Procedure: SVT ABLATION;  Surgeon: Waddell Danelle ORN, MD;  Location: [facility] INVASIVE CV LAB;  Service: Cardiovascular;  Laterality: N/A;   TEE WITHOUT CARDIOVERSION N/A 03/21/2012   Procedure: TRANSESOPHAGEAL ECHOCARDIOGRAM (TEE);  Surgeon: Salena GORMAN Negri, MD;  Location: [facility] ENDOSCOPY;  Service: Cardiovascular;  Laterality: N/A;   TEE WITHOUT CARDIOVERSION N/A 04/24/2013   Procedure: TRANSESOPHAGEAL ECHOCARDIOGRAM (TEE);  Surgeon: Leim VEAR Moose, MD;  Location: [facility] ENDOSCOPY;  Service:  Cardiovascular;  Laterality: N/A;   TEE WITHOUT CARDIOVERSION N/A 07/29/2015   Procedure: TRANSESOPHAGEAL ECHOCARDIOGRAM (TEE);  Surgeon: Vina LULLA Gull, MD;  Location: [facility] ENDOSCOPY;  Service: Cardiovascular;  Laterality: N/A;   TEE WITHOUT CARDIOVERSION N/A 05/11/2022   Procedure: TRANSESOPHAGEAL ECHOCARDIOGRAM;  Surgeon: Santo Stanly LABOR, MD;  Location: [facility] INVASIVE CV LAB;  Service: Cardiovascular;  Laterality: N/A;   TONSILLECTOMY     TOTAL KNEE ARTHROPLASTY Right 05/24/2018   Procedure: RIGHT TOTAL KNEE ARTHROPLASTY;  Surgeon: Ernie Cough, MD;  Location: [facility] ORS;  Service: Orthopedics;  Laterality: Right;  70 mins    ALLERGIES: Allergies  Allergen Reactions   Ace Inhibitors Cough   Albuterol  Other (See Comments)    Racing heart    FAMILY HISTORY:  Family History  Problem Relation Age of Onset   Cancer Father        bone   Heart failure Mother    Hypertension Mother    Asthma Mother    Cancer Paternal Grandmother        ovarian   CVA Other        Fam Hx of multiple myeloma   Diabetes Other        Fam Hx of DM    SOCIAL HISTORY: Social History   Socioeconomic History   Marital status: Married    Spouse name: Not on file   Number of children: 2   Years of education: college   Highest education level: Bachelor's degree (e.g., BA, AB, BS)  Occupational History   Occupation: RETIRED  Tobacco Use   Smoking status: Former    Current packs/day: 0.00    Average packs/day: 0.5 packs/day for 5.0 years (2.5 ttl pk-yrs)    Types: Cigarettes    Start date: 13    Quit date: 1976    Years since quitting: 49.7   Smokeless tobacco: Never   Tobacco comments:    Former smoker 03/27/22  Vaping Use   Vaping status: Never Used  Substance and Sexual Activity   Alcohol use: No    Comment: Former EtOH abuse, stopped 06/2016   Drug use: Yes    Types: Marijuana    Comment: negative hx for IV drug abuse   Sexual activity: Not on file  Other Topics Concern   Not on file   Social History Narrative   Lives alone in [place]   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   Prior [employer]   Wife lives in town (still married but lives separate)   Has biological daughter in [place] ([place]),  2 steps-1in [place] and 1 in [place])   Social Drivers of Health  Financial Resource Strain: Low Risk  (07/03/2023)   Overall Financial Resource Strain (CARDIA)    Difficulty of Paying Living Expenses: Not hard at all  Food Insecurity: No Food Insecurity (07/03/2023)   Hunger Vital Sign    Worried About Running Out of Food in the Last Year: Never true    Ran Out of Food in the Last Year: Never true  Transportation Needs: No Transportation Needs (07/03/2023)   PRAPARE - Administrator, Civil Service (Medical): No    Lack of Transportation (Non-Medical): No  Physical Activity: Inactive (07/03/2023)   Exercise Vital Sign    Days of Exercise per Week: 0 days    Minutes of Exercise per Session: Not on file  Stress: Stress Concern Present (07/03/2023)   [employer] of Occupational Health - Occupational Stress Questionnaire    Feeling of Stress: To some extent  Social Connections: Socially Isolated (07/03/2023)   Social Connection and Isolation Panel    Frequency of Communication with Friends and Family: Never    Frequency of Social Gatherings with Friends and Family: Never    Attends Religious Services: Never    Database administrator or Organizations: No    Attends Engineer, structural: Not on file    Marital Status: Separated    MEDICATIONS:  Current Outpatient Medications  Medication Sig Dispense Refill   amLODipine  (NORVASC ) 5 MG tablet Take 5 mg by mouth daily.     apixaban  (ELIQUIS ) 5 MG TABS tablet Take 1 tablet (5 mg total) by mouth 2 (two) times daily. 60 tablet 5   atorvastatin  (LIPITOR) 40 MG tablet Take 1 tablet (40 mg total) by mouth daily. 90 tablet 1   baclofen  (LIORESAL ) 10 MG tablet TAKE 0.5-1 TABLETS (5-10 MG TOTAL) BY MOUTH EVERY 8 (EIGHT) HOURS  AS NEEDED FOR MUSCLE SPASMS. 180 tablet 1   buPROPion  (WELLBUTRIN  XL) 150 MG 24 hr tablet TAKE 1 TABLET BY MOUTH EVERY DAY 90 tablet 1   carvedilol  (COREG ) 25 MG tablet TAKE 1 TABLET (25 MG TOTAL) BY MOUTH TWICE A DAY WITH MEALS 120 tablet 0   eplerenone  (INSPRA ) 25 MG tablet TAKE 1 TABLET (25 MG TOTAL) BY MOUTH DAILY. 90 tablet 1   escitalopram  (LEXAPRO ) 20 MG tablet Take 1 tablet (20 mg total) by mouth daily. 90 tablet 1   fluticasone  (FLONASE ) 50 MCG/ACT nasal spray SPRAY 2 SPRAYS INTO EACH NOSTRIL EVERY DAY 48 mL 1   gabapentin  (NEURONTIN ) 400 MG capsule Take 1 capsule (400 mg total) by mouth daily. Take 400mg  in morning, take 800mg  cap at night 30 capsule 2   gabapentin  (NEURONTIN ) 800 MG tablet Take 1 tablet (800 mg total) by mouth at bedtime. 90 tablet 6   ketoconazole  (NIZORAL ) 2 % cream Apply 1 Application topically 2 (two) times daily. 60 g 0   memantine  (NAMENDA ) 10 MG tablet Take 1 tablet (10 mg total) by mouth 2 (two) times daily. 180 tablet 1   potassium chloride  SA (KLOR-CON  M) 20 MEQ tablet Take 2 tablets (40 mEq total) by mouth 2 (two) times daily. 120 tablet 3   tamsulosin  (FLOMAX ) 0.4 MG CAPS capsule Take 1 capsule (0.4 mg total) by mouth daily. TAKE 1 CAPSULE BY MOUTH EVERY DAY. Patient to follow up with PCP prior to future refills 90 capsule 3   methimazole  (TAPAZOLE ) 5 MG tablet Take 2 tablets (10 mg total) by mouth daily. 180 tablet 3   No current facility-administered medications for this visit.  PHYSICAL EXAM: Vitals:   10/01/23 0851  BP: 120/70  Pulse: 88  Resp: 16  SpO2: 97%  Weight: 281 lb 9.6 oz (127.7 kg)  Height: 6' 4 (1.93 m)   Body mass index is 34.28 kg/m.  Wt Readings from Last 3 Encounters:  10/01/23 281 lb 9.6 oz (127.7 kg)  08/17/23 279 lb 3.2 oz (126.6 kg)  08/13/23 275 lb 9.6 oz (125 kg)    General: Well developed, well nourished male in no apparent distress.  HEENT: AT/[place], no external lesions. Hearing intact to the spoken word Eyes:  EOMI. No stare, proptosis or lid lag. Conjunctiva clear and no icterus. No erythema or watering Neck: Trachea midline, neck supple without appreciable thyromegaly or lymphadenopathy and no palpable thyroid  nodules Lungs: Clear to auscultation, no wheeze. Respirations not labored Heart: S1S2, irregular in rate and rhythm. Abdomen: Soft, non tender, non distended Neurologic: Alert, oriented, normal speech, deep tendon biceps reflexes normal,  no gross focal neurological deficit Extremities: No pedal pitting edema, no tremors of outstretched hands Skin: Warm, color good.  Psychiatric: Does not appear depressed or anxious  PERTINENT HISTORIC LABORATORY AND IMAGING STUDIES:  All pertinent laboratory results were reviewed. Please see HPI also for further details.   TSH  Date Value Ref Range Status  09/27/2023 0.47 0.40 - 4.50 mIU/L Final  08/06/2023 <0.01 (L) 0.35 - 5.50 uIU/mL Final  07/07/2023 0.02 (L) 0.35 - 5.50 uIU/mL Final    Lab Results  Component Value Date   FREET4 0.9 09/27/2023   FREET4 1.82 (H) 08/06/2023   FREET4 1.2 06/05/2015   T3FREE 3.0 09/27/2023   T3FREE 5.4 (H) 08/06/2023   T3FREE 3.0 02/22/2014   TSH 0.47 09/27/2023   TSH <0.01 (L) 08/06/2023   TSH 0.02 (L) 07/07/2023    Lab Results  Component Value Date   THYROTRECAB 2.32 (H) 08/12/2023    Lab Results  Component Value Date   TSH 0.47 09/27/2023   TSH <0.01 (L) 08/06/2023   TSH 0.02 (L) 07/07/2023   FREET4 0.9 09/27/2023   FREET4 1.82 (H) 08/06/2023   FREET4 1.2 06/05/2015     No results found for: TSI   No components found for: TRAB    ASSESSMENT / PLAN  1. Graves disease     The hyperthyroidism is most likely due to Graves disease. Patient has thyrotropin receptor antibody positive.    The three options of therapy for hyperthyroidism were discussed with the patient, including thionamide drug therapy, thyroidectomy, and radioactive iodine  ablation. I reviewed the possible complications of  rash, agranulocytosis, or liver dysfunction associated with anti-thyroid  therapies. I reviewed the risks of hemorrhage, hypocalcemia, hoarseness and hypothyroidism after thyroidectomy. Regarding radioactive iodine  ablation, I informed the patient that most patients treated with radioactive iodine  can be cured of hyperthyroidism with a single dose, but to about 15-20% may require an additional dose. I emphasized that most patients treated with radioactive iodine  develop permanent hypothyroidism, requiring lifelong replacement with thyroid  hormone. I discussed the rare occurrence of transient increase in thyroid  hormone levels after radioactive iodine  and associated with symptoms, possible worsening of Grave's eye disease, and the likely small but not insignificant risks associated with radiation exposure of this kind.   Will treat with antithyroid medication at this time.Okay to be on antithyroid medication at this time to see if spontaneous resolution occurs.   Discussed that especially with having atrial fibrillation it is important to keep thyroid  hormone levels in the normal range.  Overactive thyroid  can aggravate atrial  fibrillation.  Plan: - Continue current dose of methimazole  10 mg daily. -He needs close endocrinology follow-up. - Follow-up in 8 weeks with labs prior to follow-up visit.   Diagnoses and all orders for this visit:  Graves disease -     T4, free -     TSH -     T3, free -     methimazole  (TAPAZOLE ) 5 MG tablet; Take 2 tablets (10 mg total) by mouth daily.     DISPOSITION Follow up in clinic in 6 to 8 weeks suggested.  All questions answered and patient verbalized understanding of the plan.  Brandon Adonte Vanriper, MD [facility] Endocrinology [facility] [address] [place], [address] [place], [place] [address] Phone # [phone]   At least part of this note was generated using voice recognition software. Inadvertent word errors may have occurred, which were not  recognized during the proofreading process.

## 2023-10-06 ENCOUNTER — Telehealth: Payer: Self-pay

## 2023-10-06 NOTE — Progress Notes (Deleted)
  PROCEDURE RECORD  Physical Medicine and Rehabilitation   Name: Brandon [name]. DOB:Jul 28, 1950 MRN: [id number]  Date:10/06/2023  Physician: Prentice Compton MD    Nurse/CMA: Tye Kitty MA  Allergies:  Allergies  Allergen Reactions   Ace Inhibitors Cough   Albuterol  Other (See Comments)    Racing heart    Consent Signed: {yes wn:[id number]}  Is patient diabetic? {yes no:314532}  CBG today? ***  Pregnant: {yes no:314532} LMP: No LMP for male patient. (age 75-55)  Anticoagulants: {Yes/No:19989} Anti-inflammatory: {Yes/No:19989} Antibiotics: {Yes/No:19989}  Procedure: Epidural Steroid Injection  Position: Prone Start Time: ***  End Time: ***  Fluoro Time: ***  RN/CMA      Time      BP      Pulse      Respirations      O2 Sat      S/S      Pain Level       D/C home with ***, patient A & O X 3, D/C instructions reviewed, and sits independently.

## 2023-10-06 NOTE — Telephone Encounter (Signed)
 Return call to patient regarding upcoming pre procedure scheduled for 10/08/23.  Patient has questions and concerns regarding procedure.  It appears patient is taking blood thinner and will need to review current medications with patient.  We have not rec'd approval from insurance as of 10/06/23 and will need to reschedule procedure to allow insurance approval and patient to get clearance to hold blood thinner prior to procedure.

## 2023-10-07 ENCOUNTER — Telehealth: Payer: Self-pay

## 2023-10-07 NOTE — Telephone Encounter (Addendum)
 Called and spoke with patient to discuss Pre-procedure instructions and to advise patient that appointment needs to be pushed out until we receive clearance from MD that prescribes Eliquis  and okays holding medication prior to procedure.  It also appears that we have not rec'd approval from insurance as of 10/07/23 and Procedure is scheduled for 10/08/23.  We will need to obtain prior authorization from insurance for Procedure and medical clearance to hold Eliquis  from Dr. Nancey prior to scheduling procedure.  Patient verbalized understanding and agrees with plan as noted above.  Advised patient that I will mail a copy of Pre Procedure instructions once we receive insurance approval and clearance from Dr. Nancey.  Patient verbally agrees with plan as above.

## 2023-10-08 ENCOUNTER — Encounter: Admitting: Physical Medicine & Rehabilitation

## 2023-10-11 ENCOUNTER — Encounter: Payer: Self-pay | Admitting: Physical Medicine & Rehabilitation

## 2023-10-11 ENCOUNTER — Encounter: Attending: Physical Medicine & Rehabilitation | Admitting: Physical Medicine & Rehabilitation

## 2023-10-11 VITALS — BP 131/88 | HR 74 | Ht 76.0 in | Wt 285.6 lb

## 2023-10-11 DIAGNOSIS — F1220 Cannabis dependence, uncomplicated: Secondary | ICD-10-CM | POA: Diagnosis not present

## 2023-10-11 DIAGNOSIS — G2581 Restless legs syndrome: Secondary | ICD-10-CM | POA: Insufficient documentation

## 2023-10-11 DIAGNOSIS — M48062 Spinal stenosis, lumbar region with neurogenic claudication: Secondary | ICD-10-CM | POA: Insufficient documentation

## 2023-10-11 DIAGNOSIS — G894 Chronic pain syndrome: Secondary | ICD-10-CM | POA: Insufficient documentation

## 2023-10-11 MED ORDER — SUZETRIGINE 50 MG PO TABS: 50.0000 mg | ORAL_TABLET | Freq: Two times a day (BID) | ORAL | 0 refills | Status: DC | PRN

## 2023-10-11 MED ORDER — GABAPENTIN 400 MG PO CAPS: 400.0000 mg | ORAL_CAPSULE | Freq: Every day | ORAL | 2 refills | Status: DC

## 2023-10-11 MED ORDER — GABAPENTIN 800 MG PO TABS: 800.0000 mg | ORAL_TABLET | Freq: Every day | ORAL | 6 refills | Status: AC

## 2023-10-11 NOTE — Progress Notes (Addendum)
 Subjective:    Patient ID: Brandon [name]., male    DOB: 03-06-1950, 73 y.o.   MRN: [id number]  HPI     Brandon [name]. is a 73 y.o. year old male  who  has a past medical history of Anxiety, Arthritis, Benign neoplasm of colon, Benign prostatic hyperplasia with urinary obstruction, Bilateral inguinal hernia (05/29/2011), Bilateral pulmonary embolism (HCC) (3/14), Breast lump (03/29/2019), Calculus of kidney (10/21/2016), Cataract, Chronic bilateral low back pain with sciatica (03/21/2012), Chronic pain, Finger mass, right (10/17/2020), History of alcohol abuse (03/07/2016), History of colon polyps (11/18/2015), History of kidney stones, History of pulmonary embolism (03/19/2012), Hyperlipemia, Hypertension, Insomnia, Insomnia due to medical condition (12/23/2016), Major depressive disorder, recurrent episode (HCC), Mitral regurgitation (04/24/2013), Morbid obesity (HCC) (03/07/2016), Nonrheumatic mitral valve insufficiency (04/25/2013), Obesity, OSA (obstructive sleep apnea), Osteoporosis, Persistent atrial fibrillation (HCC), Recurrent unilateral inguinal hernia (07/17/2013), Restless leg syndrome, S/P right total knee arthroplasty (05/24/2018), Sciatica, Spinal stenosis, lumbar region with neurogenic claudication, SVT (supraventricular tachycardia) (HCC) (03/07/2016), Thrombus of left atrial appendage (03/09/2013), Uncomplicated asthma (06/20/2008), and Ventral hernia, unspecified, without mention of obstruction or gangrene.   They are presenting to PM&R clinic as a new patient for pain management evaluation. They were referred by Dr. Joshua for treatment of lumbar spinal stenosis pain.  Brandon [name] reports progressive worsening pain in his lower back that radiates to his outer and inner thighs.  He says this pain has been worsening gradually over many years.  Pain is worsened with standing up and walking.  Pain has a squeezing quality and he says the pain worsens with each step when he is walking.  Pain is improved with  bending forward or sitting down.  He reports having a history of scoliosis.  He was previously followed by [facility].  Patient was also seen for several years by Dr. Eldonna.  He had a trial of spinal cord stimulator but appears it was not beneficial enough to where he wanted to have it implanted.    Patient reports multiple spinal injections by Dr. Eldonna and he thinks that injections that he had reduced his pain from several weeks.  He is not sure which injections ESI versus facet joint ablation provided him with the best relief.   Chart review indicates he had radiofrequency ablation of left L3-4, L4-5 and L5-S1 facet joints by Dr. Eldonna after having a double diagnostic medial branch block with more than 50% relief.  It does not appear he had a lot of relief from the RFA.   More recently he was seen at [facility] neurosurgery.  Patient reports neurosurgery suggested he do injections again and he wanted to do them in [place] versus doing them at [facility] due to distance.  Chart review indicates neurosurgery feels that possibly upper lumbar facet joints could be contributing to his pain and they suggested upper lumbar MBB and possible RFA.   Patient reports having leg length discrepancy and has a lift in his left shoe.   Patient reports he tries to stay active and tries to exercise in a pool when he can.  He used to like to swim when he was younger.   He uses a cane for balance but does not want to use a walker.   Red flag symptoms: No red flags for back pain endorsed in Hx or ROS   Medications tried: Topical medications denies , tried CBD ointment in the past  Nsaids - limited by Afib  Tylenol   - Helps a little  Opiates  Hydrocodone  and oxycodone - help a little, helps him.  He would like to use this only intermittently when his pain is very severe during acute exacerbations Gabapentin  /- Takes TID 800mg   TCAs  -denies  SNRIs  -does not recall   Other treatments: PT- helps while he is getting  it but results havent lasted  TENs unit -denies benefit Injections -ESI, RFA, spinal cord stimulator trial in past by Dr. Eldonna  Surgery denies prior spine surgery   Goals for pain management: To be more active and have decreased pain     Pain is going down his left leg to his knee    Prior UDS results:  Labs (Brief)          Component Value Date/Time    LABOPIA NEGATIVE 03/17/2012 0133    COCAINSCRNUR NEGATIVE 03/17/2012 0133    LABBENZ POSITIVE (A) 03/17/2012 0133    AMPHETMU NEGATIVE 03/17/2012 0133      Interval history 11/12/2022 Brandon [name] is here for follow-up regarding his chronic low back pain.  He had bilateral T12, L1, L2 medial branch blocks completed on 10/27/2022 by Dr. Carilyn.  Patient reports he did not have significant benefit after his procedure.  Patient reports he tried to go to aquatic therapy, however patient had incident with staff and left without being seen.  Pain overall unchanged since last visit.  He has not tried Lyrica  yet, has not picked up from the pharmacy.   Interval history 12/15/22 Patient is here for follow-up regarding his chronic back pain.  He continues to have significant back pain although he feels like it is improved since starting Lyrica .  Reports he is tolerating this medication well.  He continues to have pain during activities.  He is open to retrying aquatic therapy.  His activity continues to be limited, has a hard time walking into stores from the parking lot when he needs to go shopping or pick up something from the house.. Reports occasional dizziness when for standing up, this improves after a few steps.   Interval history 02/15/23 Brandon [name] is here for follow-up regarding his chronic back pain.  He reports that therapy is helping a lot and he feels like his function is much improved.  He is very happy with his treatment at [facility].  He continues to work with therapy. Patient reports his PCP has changed his medication back to  gabapentin  from Lyrica .  He feels like there was not much difference in his pain control and the gabapentin  was helping his RLS better.    Interval history 05/17/23 Mr. Rancourt is here for follow-up regarding his chronic back pain.  Reports aquatic therapy is helpful and he enjoyed working at [employer].  Back pain overall is doing better.  He has not yet started exercising in the pool on his own but would like to do so.  Patient recently moved to a new home and has been more busy due to this.  He also has treatment for A-fib scheduled.  Reports baclofen  in the past was beneficial for his back pain and did not cause any significant side effects.  Interval history 08/17/23 Mr. Oconnor reports for the past few weeks he has been having more limitations and difficulty due to chronic pain.   He continues to have pain in his lower back but largely his pain is in his thighs bilaterally.  Pain is worsened with standing and ambulation.  It limits the distance he can After standing and walking for  a while his thighs will become tingly and feel numb.  Reports he is able to exercise better when he is on the exercise bike.  Pain is improved with sitting down or bending over.  Patient has been taking gabapentin  800 mg at night that has been beneficial to his pain, he does not take it during the day currently.  He reports he took it in the past during the day without any significant side effects.  Patient reports he was recently diagnosed with Graves' disease.   Interval history 10/11/23 Reports significant worsening of low back pain over the past week, describing it as terrible with resulting in more difficulty walking. Pain is located across the entire lower back. States he is not emptying his bladder well. He has a urologist but has not seen him recently. Continues to use legally purchased THC/hemp products for pain. Notes his appointment with Dr. Clorinda for injections has been rescheduled to 10/28/2023 for ESI.  -  Gabapentin : Taking 800 mg at night only. Was previously confused about the dosing schedule and ran out.  - Tamsulosin : Taking once daily for urinary symptoms. Notes the prescription requires a urology follow-up for renewal. - Lexapro  - Wellbutrin  - Recently diagnosed with Graves' disease and started on new medications by another provider.  Pain Inventory Average Pain 8 Pain Right Now 9 My pain is aching when walking or standing In the last 24 hours, has pain interfered with the following? General activity 9 Relation with others 9 Enjoyment of life 9 What TIME of day is your pain at its worst? daytime Sleep (in general) Poor  Pain is worse with: standing, standing Pain improves with: rest Relief from Meds: 0  Family History  Problem Relation Age of Onset   Cancer Father        bone   Heart failure Mother    Hypertension Mother    Asthma Mother    Cancer Paternal Grandmother        ovarian   CVA Other        Fam Hx of multiple myeloma   Diabetes Other        Fam Hx of DM   Social History   Socioeconomic History   Marital status: Married    Spouse name: Not on file   Number of children: 2   Years of education: college   Highest education level: Bachelor's degree (e.g., BA, AB, BS)  Occupational History   Occupation: RETIRED  Tobacco Use   Smoking status: Former    Current packs/day: 0.00    Average packs/day: 0.5 packs/day for 5.0 years (2.5 ttl pk-yrs)    Types: Cigarettes    Start date: 85    Quit date: 1976    Years since quitting: 49.7   Smokeless tobacco: Never   Tobacco comments:    Former smoker 03/27/22  Vaping Use   Vaping status: Never Used  Substance and Sexual Activity   Alcohol use: No    Comment: Former EtOH abuse, stopped 06/2016   Drug use: Yes    Types: Marijuana    Comment: negative hx for IV drug abuse   Sexual activity: Not on file  Other Topics Concern   Not on file  Social History Narrative   Lives alone in [place]   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   Prior [employer]   Wife lives in town (still married but lives separate)   Has biological daughter in [place]),  2 steps-1in [place] and 1 in [place])  Social Drivers of Corporate Investment Banker Strain: Low Risk  (07/03/2023)   Overall Financial Resource Strain (CARDIA)    Difficulty of Paying Living Expenses: Not hard at all  Food Insecurity: No Food Insecurity (07/03/2023)   Hunger Vital Sign    Worried About Running Out of Food in the Last Year: Never true    Ran Out of Food in the Last Year: Never true  Transportation Needs: No Transportation Needs (07/03/2023)   PRAPARE - Administrator, Civil Service (Medical): No    Lack of Transportation (Non-Medical): No  Physical Activity: Inactive (07/03/2023)   Exercise Vital Sign    Days of Exercise per Week: 0 days    Minutes of Exercise per Session: Not on file  Stress: Stress Concern Present (07/03/2023)   [employer] of Occupational Health - Occupational Stress Questionnaire    Feeling of Stress: To some extent  Social Connections: Socially Isolated (07/03/2023)   Social Connection and Isolation Panel    Frequency of Communication with Friends and Family: Never    Frequency of Social Gatherings with Friends and Family: Never    Attends Religious Services: Never    Database Administrator or Organizations: No    Attends Engineer, Structural: Not on file    Marital Status: Separated   Past Surgical History:  Procedure Laterality Date   ATRIAL FIBRILLATION ABLATION N/A 02/27/2022   Procedure: ATRIAL FIBRILLATION ABLATION;  Surgeon: Nancey, Eulas BRAVO, MD;  [facility];  Service: Cardiovascular;  Laterality: N/A;   CARDIOVERSION N/A 03/21/2012   Procedure: CARDIOVERSION;  Surgeon: Salena GORMAN Negri, MD;  Location: [facility] ENDOSCOPY;  Service: Cardiovascular;  Laterality: N/A;   CARDIOVERSION N/A 04/24/2013   Procedure: CARDIOVERSION;  Surgeon: Leim VEAR Moose, MD;  Location:  [facility] ENDOSCOPY;  Service: Cardiovascular;  Laterality: N/A;   CARDIOVERSION N/A 06/27/2015   Procedure: CARDIOVERSION;  Surgeon: Ezra GORMAN Shuck, MD;  Location: [facility] ENDOSCOPY;  Service: Cardiovascular;  Laterality: N/A;   CARDIOVERSION N/A 11/11/2021   Procedure: CARDIOVERSION;  Surgeon: Pietro Redell GORMAN, MD;  Location: [facility] ENDOSCOPY;  Service: Cardiovascular;  Laterality: N/A;   CARDIOVERSION N/A 05/11/2022   Procedure: CARDIOVERSION;  Surgeon: Santo Stanly LABOR, MD;  [facility];  Service: Cardiovascular;  Laterality: N/A;   CARDIOVERSION N/A 07/13/2022   Procedure: CARDIOVERSION;  Surgeon: Francyne Headland, MD;  [facility];  Service: Cardiovascular;  Laterality: N/A;   CATARACT EXTRACTION     COLONOSCOPY W/ POLYPECTOMY     ELECTROPHYSIOLOGIC STUDY N/A 07/30/2015   Procedure: Atrial Fibrillation Ablation;  Surgeon: Lynwood Rakers, MD;  Location: [facility] INVASIVE CV LAB;  Service: Cardiovascular;  Laterality: N/A;   EXTRACORPOREAL SHOCK WAVE LITHOTRIPSY Right 10/29/2016   Procedure: RIGHT EXTRACORPOREAL SHOCK WAVE LITHOTRIPSY (ESWL);  Surgeon: Alvaro Hummer, MD;  Location: [facility] ORS;  Service: Urology;  Laterality: Right;   EYE SURGERY Right    cataract   FEMUR FRACTURE SURGERY     HERNIA REPAIR  07/06/2011   INGUINAL HERNIA REPAIR Right 07/28/2013   Procedure: RIGHT INGUINAL HERNIA REPAIR;  Surgeon: Donnice POUR. Belinda, MD;  Location: [facility] OR;  Service: General;  Laterality: Right;   INGUINAL HERNIA REPAIR Right 09/22/2016   Procedure: LAPAROSCOPIC RIGHT INGUINAL HERNIA;  Surgeon: Stevie Herlene Righter, MD;  Location: [facility] ORS;  Service: General;  Laterality: Right;  With MESH   INSERTION OF MESH Right 07/28/2013   Procedure: INSERTION OF MESH;  Surgeon: Donnice POUR. Tsuei, MD;  Location: [facility] OR;  Service: General;  Laterality: Right;   JOINT REPLACEMENT  Both knees 2018   KNEE ARTHROSCOPY     left   REPLACEMENT TOTAL KNEE Left    ROTATOR CUFF REPAIR     right    ROTATOR CUFF REPAIR Left 03/08/2014   DR SUPPLE   SHOULDER ARTHROSCOPY WITH ROTATOR CUFF REPAIR AND SUBACROMIAL DECOMPRESSION Left 03/08/2014   Procedure: LEFT SHOULDER ARTHROSCOPY WITH ROTATOR CUFF REPAIR/SUBACROMIAL DECOMPRESSION/DISTAL CLAVICLE RESECTION;  Surgeon: Franky CHRISTELLA Pointer, MD;  [facility];  Service: Orthopedics;  Laterality: Left;   SVT ABLATION N/A 01/23/2019   Procedure: SVT ABLATION;  Surgeon: Waddell Danelle ORN, MD;  Location: [facility] INVASIVE CV LAB;  Service: Cardiovascular;  Laterality: N/A;   TEE WITHOUT CARDIOVERSION N/A 03/21/2012   Procedure: TRANSESOPHAGEAL ECHOCARDIOGRAM (TEE);  Surgeon: Salena GORMAN Negri, MD;  Location: [facility] ENDOSCOPY;  Service: Cardiovascular;  Laterality: N/A;   TEE WITHOUT CARDIOVERSION N/A 04/24/2013   Procedure: TRANSESOPHAGEAL ECHOCARDIOGRAM (TEE);  Surgeon: Leim VEAR Moose, MD;  Location: [facility] ENDOSCOPY;  Service: Cardiovascular;  Laterality: N/A;   TEE WITHOUT CARDIOVERSION N/A 07/29/2015   Procedure: TRANSESOPHAGEAL ECHOCARDIOGRAM (TEE);  Surgeon: Vina LULLA Gull, MD;  Location: [facility] ENDOSCOPY;  Service: Cardiovascular;  Laterality: N/A;   TEE WITHOUT CARDIOVERSION N/A 05/11/2022   Procedure: TRANSESOPHAGEAL ECHOCARDIOGRAM;  Surgeon: Santo Stanly LABOR, MD;  [facility];  Service: Cardiovascular;  Laterality: N/A;   TONSILLECTOMY     TOTAL KNEE ARTHROPLASTY Right 05/24/2018   Procedure: RIGHT TOTAL KNEE ARTHROPLASTY;  Surgeon: Ernie Cough, MD;  [facility]: [facility];  Service: Orthopedics;  Laterality: Right;  70 mins   Past Surgical History:  Procedure Laterality Date   ATRIAL FIBRILLATION ABLATION N/A 02/27/2022   Procedure: ATRIAL FIBRILLATION ABLATION;  Surgeon: Nancey Eulas BRAVO, MD;  [facility];  Service: Cardiovascular;  Laterality: N/A;   CARDIOVERSION N/A 03/21/2012   Procedure: CARDIOVERSION;  Surgeon: Salena GORMAN Negri, MD;  [facility];  Service: Cardiovascular;  Laterality: N/A;   CARDIOVERSION  N/A 04/24/2013   Procedure: CARDIOVERSION;  Surgeon: Leim VEAR Moose, MD;  Location: [facility] ENDOSCOPY;  Service: Cardiovascular;  Laterality: N/A;   CARDIOVERSION N/A 06/27/2015   Procedure: CARDIOVERSION;  Surgeon: Ezra GORMAN Shuck, MD;  Location: [facility] ENDOSCOPY;  Service: Cardiovascular;  Laterality: N/A;   CARDIOVERSION N/A 11/11/2021   Procedure: CARDIOVERSION;  Surgeon: Pietro Redell GORMAN, MD;  Location: [facility] ENDOSCOPY;  Service: Cardiovascular;  Laterality: N/A;   CARDIOVERSION N/A 05/11/2022   Procedure: CARDIOVERSION;  Surgeon: Santo Stanly LABOR, MD;  [facility];  Service: Cardiovascular;  Laterality: N/A;   CARDIOVERSION N/A 07/13/2022   Procedure: CARDIOVERSION;  Surgeon: Francyne Headland, MD;  [facility];  Service: Cardiovascular;  Laterality: N/A;   CATARACT EXTRACTION     COLONOSCOPY W/ POLYPECTOMY     ELECTROPHYSIOLOGIC STUDY N/A 07/30/2015   Procedure: Atrial Fibrillation Ablation;  Surgeon: Lynwood Rakers, MD;  Location: [facility] INVASIVE CV LAB;  Service: Cardiovascular;  Laterality: N/A;   EXTRACORPOREAL SHOCK WAVE LITHOTRIPSY Right 10/29/2016   Procedure: RIGHT EXTRACORPOREAL SHOCK WAVE LITHOTRIPSY (ESWL);  Surgeon: Alvaro Hummer, MD;  Location: [facility];  Service: Urology;  Laterality: Right;   EYE SURGERY Right    cataract   FEMUR FRACTURE SURGERY     HERNIA REPAIR  07/06/2011   INGUINAL HERNIA REPAIR Right 07/28/2013   Procedure: RIGHT INGUINAL HERNIA REPAIR;  Surgeon: Cough POUR. Belinda, MD;  [facility];  Service: General;  Laterality: Right;  INGUINAL HERNIA REPAIR Right 09/22/2016   Procedure: LAPAROSCOPIC RIGHT INGUINAL HERNIA;  Surgeon: Kinsinger, Herlene Righter, MD;  Location: [facility];  Service: General;  Laterality: Right;  With MESH   INSERTION OF MESH Right 07/28/2013   Procedure: INSERTION OF MESH;  Surgeon: Donnice POUR. Tsuei, MD;  [facility];  Service: General;  Laterality: Right;   JOINT REPLACEMENT  Both knees 2018   KNEE  ARTHROSCOPY     left   REPLACEMENT TOTAL KNEE Left    ROTATOR CUFF REPAIR     right   ROTATOR CUFF REPAIR Left 03/08/2014   DR SUPPLE   SHOULDER ARTHROSCOPY WITH ROTATOR CUFF REPAIR AND SUBACROMIAL DECOMPRESSION Left 03/08/2014   Procedure: LEFT SHOULDER ARTHROSCOPY WITH ROTATOR CUFF REPAIR/SUBACROMIAL DECOMPRESSION/DISTAL CLAVICLE RESECTION;  Surgeon: Franky CHRISTELLA Pointer, MD;  [facility];  Service: Orthopedics;  Laterality: Left;   SVT ABLATION N/A 01/23/2019   Procedure: SVT ABLATION;  Surgeon: Waddell Danelle ORN, MD;  Location: [facility] INVASIVE CV LAB;  Service: Cardiovascular;  Laterality: N/A;   TEE WITHOUT CARDIOVERSION N/A 03/21/2012   Procedure: TRANSESOPHAGEAL ECHOCARDIOGRAM (TEE);  Surgeon: Salena GORMAN Negri, MD;  Location: [facility] ENDOSCOPY;  Service: Cardiovascular;  Laterality: N/A;   TEE WITHOUT CARDIOVERSION N/A 04/24/2013   Procedure: TRANSESOPHAGEAL ECHOCARDIOGRAM (TEE);  Surgeon: Leim VEAR Moose, MD;  Location: [facility] ENDOSCOPY;  Service: Cardiovascular;  Laterality: N/A;   TEE WITHOUT CARDIOVERSION N/A 07/29/2015   Procedure: TRANSESOPHAGEAL ECHOCARDIOGRAM (TEE);  Surgeon: Vina LULLA Gull, MD;  Location: [facility] ENDOSCOPY;  Service: Cardiovascular;  Laterality: N/A;   TEE WITHOUT CARDIOVERSION N/A 05/11/2022   Procedure: TRANSESOPHAGEAL ECHOCARDIOGRAM;  Surgeon: Santo Stanly LABOR, MD;  [facility];  Service: Cardiovascular;  Laterality: N/A;   TONSILLECTOMY     TOTAL KNEE ARTHROPLASTY Right 05/24/2018   Procedure: RIGHT TOTAL KNEE ARTHROPLASTY;  Surgeon: Ernie Donnice, MD;  Location: [facility] OR[facility];  Service: Orthopedics;  Laterality: Right;  70 mins   Past Medical History:  Diagnosis Date   Allergy 01/05/1664   Anxiety 05/12/1996   Arthritis 07/30/1993   Benign neoplasm of colon    Benign prostatic hyperplasia with urinary obstruction    Bilateral inguinal hernia 05/29/2011   Bilateral pulmonary embolism (HCC) 03/2012   admitted to [facility],  treated with Xarelto    Breast  lump 03/29/2019   Calculus of kidney 10/21/2016   Cataract    Chronic bilateral low back pain with sciatica 03/21/2012   Chronic pain    Finger mass, right 10/17/2020   History of alcohol abuse 03/07/2016   History of colon polyps 11/18/2015   History of kidney stones    History of pulmonary embolism 03/19/2012   Hyperlipemia    Hypertension    Insomnia    Insomnia due to medical condition 12/23/2016   Polyuria secondary to BPH   Major depressive disorder, recurrent episode    Mitral regurgitation 04/24/2013   Mild by TEE   Morbid obesity (HCC) 03/07/2016   Myocardial infarction (HCC) 02/21/2003   Nonrheumatic mitral valve insufficiency 04/25/2013   Mild by TEE  Overview:  Overview:  Mild by TEE Overview:  Overview:  Overview:  Mild by TEE Overview:  Overview:  Mild by TEE   Obesity    OSA (obstructive sleep apnea)    noncompliant with CPAP.  07/27/13- awaiting a CPAP- unable to tolerate mask   Osteoporosis    Persistent atrial fibrillation (HCC)    Recurrent unilateral inguinal hernia 07/17/2013   Restless leg syndrome    takes gabapentin   S/P right total knee arthroplasty 05/24/2018   Sciatica    Sleep apnea    Spinal stenosis, lumbar region with neurogenic claudication    SVT (supraventricular tachycardia) 03/07/2016   Thrombus of left atrial appendage 03/09/2013   Uncomplicated asthma 06/20/2008   Overview:  Overview:  Overview:  Overview:  Qualifier: Diagnosis of  By: Krystal MD, Reyes LABOR Overview:  Overview:  Qualifier: Diagnosis of  By: Krystal MD, Reyes LABOR   Ventral hernia, unspecified, without mention of obstruction or gangrene    right abdominal wall   BP 131/88   Pulse 74   Ht 6' 4 (1.93 m)   Wt 285 lb 9.6 oz (129.5 kg)   SpO2 92%   BMI 34.76 kg/m   Opioid Risk Score:   Fall Risk Score:  `1  Depression screen [facility] 2/9     10/11/2023    9:43 AM 08/17/2023    9:24 AM 08/06/2023   11:02 AM 05/17/2023    9:14 AM 05/04/2023    9:44 AM 04/02/2023    9:57 AM  02/15/2023   10:35 AM  Depression screen PHQ 2/9  Decreased Interest 3 3 3 3 3 3  0  Down, Depressed, Hopeless 3 3 2 3 3 3  0  PHQ - 2 Score 6 6 5 6 6 6  0  Altered sleeping  0 3  3 1    Tired, decreased energy  3 2  3  0   Change in appetite  0 2  2 0   Feeling bad or failure about yourself   0 3  2 0   Trouble concentrating  0 2  2 0   Moving slowly or fidgety/restless  0 0  1 0   Suicidal thoughts  0 0  1 0   PHQ-9 Score  9 17  20 7    Difficult doing work/chores  Extremely dIfficult Somewhat difficult  Extremely dIfficult        Review of Systems  Musculoskeletal:  Positive for back pain.       Back pain, Bilateral leg pain  All other systems reviewed and are negative.      Objective:   Physical Exam     10/11/2023    9:21 AM 10/01/2023    8:51 AM 08/17/2023    9:26 AM  Vitals with BMI  Height 6' 4 6' 4 6' 4  Weight 285 lbs 10 oz 281 lbs 10 oz 279 lbs 3 oz  BMI 34.78 34.29 34  Systolic 131 120 891  Diastolic 88 70 77  Pulse 74 88 64    Gen: no distress, normal appearing HEENT: oral mucosa pink and moist Chest: normal effort, normal rate of breathing Abd: soft, non-distended Ext: no edema Psych: Appropriate, pleasant Skin: intact Neuro: Alert and oriented, follows commands, cranial nerves II through XII grossly intact, normal speech and language RUE: 5/5 Deltoid, 5/5 Biceps, 5/5 Triceps, 5/5 Wrist Ext, 5/5 Grip LUE: 5/5 Deltoid, 5/5 Biceps, 5/5 Triceps, 5/5 Wrist Ext, 5/5 Grip RLE: HF 5/5, KE 5/5,ADF 5/5, APF 5/5 LLE: HF 5/5, KE 5/5, ADF 5/5, APF 5/5 Sensory exam normal for light touch and pain in all 4 limbs.  No ankle clonus   Musculoskeletal:  TTP  bilateral paraspinal muscles in the lumbar spine Kyphotic posture Slump test negative Facet loading not checked today Walking with cane , short step lengths Pain with spinal extension  MRI from [facility] note 05/14/22 Lumbar MRI 05/13/22: Anatomical variants: none Alignment: Degenerative levoscoliosis. Conus  medullaris:  terminates at approximately L1. Spinal Cord and Cauda Equina: Visualized portions of the spinal cord is normal in morphology and signal. Normal appearance of the cauda equina.  Bone marrow signal: No suspicious lesions.  T12-L1: Mild disc and facet degenerative changes resulting in mild spinal canal stenosis.  L1-L2: Unremarkable.  L2-L3: Mild disc and facet degenerative changes without significant spinal canal or neural foraminal stenosis.  L3-L4: Disc bulge and facet arthropathy resulting in mild canal stenosis.  L4-L5: Mild disc and facet degenerative changes resulting in mild canal stenosis.  L5-S1: Advanced bilateral facet arthropathy and mild disc degenerative changes resulting in mild left neural foraminal stenosis.  IMPRESSION: Multilevel disc and facet degenerative changes above without high-grade stenosis.    L spine Xray from [facility] note  Spine x-rays 04/01/22: IMPRESSION: 1. S-shaped scoliosis of the thoracolumbar spine with positive sagittal and coronal imbalance. 2. Mild stepwise retrolisthesis from L2 to L5 without evidence of dynamic instability. 3. 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    MRI 09/06/23 IMPRESSION: Similar multilevel degenerative change, described above. No evidence of impingement.    Assessment & Plan:   1) Chronic low back pain with degenerative spinal canal stenosis and multilevel lumbar facet joint degeneration   2) Depression, denies SI or HI. On Lexapro  20 mg daily IMPRESSION: Similar multilevel degenerative change, described above. No evidence of impingement. 3) Dizziness when getting up intermittently.  Suspect orthostatic hypotension  4)Marijuanna product use     -Patient was previously started on Lyrica  100 mg 3 times daily.  PCP has restarted gabapentin , he feels like it was better for his RLS.  He did not notice a significant pain improvement with the Lyrica  over gabapentin .    -Continue gabapentin  800 mg at night. May increase to include a 400 mg morning dose if tolerated -Pt has degeneration in upper facet joints of the lumbar spine, consider medial branch block/RFA to upper lumbar spine.  Lower lumbar spine RFA was completed previously by Dr. Eldonna.  Patient was referred for medial branch block of upper lumbar spine.  Medial branch block bilateral T12, L1-L2 and this was completed on 10/24 without significant benefit reported. -Most recent symptoms appear most consistent with spinal stenosis.  Referred to Dr. Carilyn for L-spine ESI, patient indicated L-spine ESI was helpful in the past- Visit scheduled later this month- Proceed with scheduled epidural steroid injection with Dr. Clorinda on 10/28/2023. -MRI L spine- largely similar to prior, reviewed with patient  -Prior visit I placed referral to psychiatry for medication management due to continued depression -Completed aquatic therapy-[facility]  was helpful  -Advised to continue exercise in pool - Previously was considering tramadol  THC noted on UDS.  Patient has been using over-the-counter marijuana based products. -Explained that clinic policy prevents prescribing most controlled medications concurrently with THC/hemp products due to substance use concerns. Explained that a negative urine drug screen for THC would be required to initiate these therapies, with random screening thereafter. He will consider this option. - Recommended follow-up with his urologist to evaluate worsening urinary symptoms. -Continue baclofen  5-10mg  TID PRN  -Will place a referral to a local neurosurgery group for re-evaluation of surgical options per patient request -Will order Journavx for short-term use for the acute pain flare. Discussed that this is a new, non-narcotic medication approved for short-term use only. Counseled on potential side effects including nausea and muscle spasms. He will read about it and decide whether to fill the  prescription.   Addendum: ESI  reported to be beneficial

## 2023-10-12 ENCOUNTER — Telehealth: Payer: Self-pay

## 2023-10-12 NOTE — Telephone Encounter (Signed)
 Pre Procedure Instructions mailed to patient with Eliquis  instructions advised by patients Cardiologist Suzzanna Furbish, MD)

## 2023-10-12 NOTE — Telephone Encounter (Signed)
 Called patient, no answer, left message for patient to call our office to review Pre Procedure instructions and to hold Eli quis 3 days prior (okay per Dr. Nancey).  Copy of medication approval noted below.  Will mail a copy of Pre-Procedure instructions to patient as previously discusses.   Mealor, Eulas BRAVO, MD  Georgina Bari CROME, CMA That should be ok.       Previous Messages    ----- Message ----- From: Georgina Bari CROME, CMA Sent: 10/07/2023  12:02 PM EDT To: Eulas BRAVO Nancey, MD Subject: medication clearance for Eliquis               Dr. Nancey,  Patient seen in our office for Spinal Stenosis of Lumbar Region with Neurogenic Claudication, Arthropathy of Lumbar Facet Joint and Chronic Pain.  Patient to be scheduled for Epidural Steroid Injection and will need to hold the Eliquis  for 3 days prior to this procedure.    We will need to get approval to hold medication prior to to scheduling the ESI procedure.     Please advise,  Bari Georgina, CMA [facility]

## 2023-10-17 ENCOUNTER — Other Ambulatory Visit: Payer: Self-pay | Admitting: Family Medicine

## 2023-10-17 ENCOUNTER — Other Ambulatory Visit: Payer: Self-pay | Admitting: Internal Medicine

## 2023-10-17 DIAGNOSIS — E876 Hypokalemia: Secondary | ICD-10-CM

## 2023-10-17 DIAGNOSIS — E785 Hyperlipidemia, unspecified: Secondary | ICD-10-CM

## 2023-10-20 ENCOUNTER — Ambulatory Visit (HOSPITAL_COMMUNITY)
Admission: RE | Admit: 2023-10-20 | Discharge: 2023-10-20 | Disposition: A | Discharge status: Home / Self Care | Source: Ambulatory Visit | Attending: Cardiovascular Disease | Admitting: Cardiovascular Disease

## 2023-10-20 DIAGNOSIS — I4819 Other persistent atrial fibrillation: Secondary | ICD-10-CM | POA: Insufficient documentation

## 2023-10-20 LAB — ECHOCARDIOGRAM COMPLETE
AR max vel: 2.03 cm2
AV Area VTI: 2.31 cm2
AV Area mean vel: 1.92 cm2
AV Mean grad: 2 mmHg
AV Peak grad: 2.8 mmHg
Ao pk vel: 0.83 m/s
Area-P 1/2: 4.21 cm2
MV M vel: 2.8 m/s
MV Peak grad: 31.2 mmHg
S' Lateral: 4.5 cm

## 2023-10-21 ENCOUNTER — Ambulatory Visit (HOSPITAL_COMMUNITY): Payer: Self-pay | Admitting: Physician Assistant

## 2023-10-27 ENCOUNTER — Other Ambulatory Visit: Payer: Self-pay | Admitting: Family Medicine

## 2023-10-27 ENCOUNTER — Inpatient Hospital Stay
Admission: RE | Admit: 2023-10-27 | Discharge: 2023-10-27 | Disposition: A | Discharge status: Home / Self Care | Payer: Self-pay | Source: Ambulatory Visit | Attending: Neurosurgery | Admitting: Neurosurgery

## 2023-10-27 DIAGNOSIS — Z049 Encounter for examination and observation for unspecified reason: Secondary | ICD-10-CM

## 2023-10-28 ENCOUNTER — Encounter: Payer: Self-pay | Admitting: Physical Medicine & Rehabilitation

## 2023-10-28 ENCOUNTER — Encounter (HOSPITAL_BASED_OUTPATIENT_CLINIC_OR_DEPARTMENT_OTHER): Admitting: Physical Medicine & Rehabilitation

## 2023-10-28 VITALS — BP 118/77 | HR 69 | Ht 76.0 in | Wt 282.0 lb

## 2023-10-28 DIAGNOSIS — M48062 Spinal stenosis, lumbar region with neurogenic claudication: Secondary | ICD-10-CM | POA: Diagnosis not present

## 2023-10-28 MED ORDER — LIDOCAINE HCL (PF) 1 % IJ SOLN
2.0000 mL | Freq: Once | INTRAMUSCULAR | Status: AC
  Administered 2023-10-28: 2 mL

## 2023-10-28 MED ORDER — LIDOCAINE HCL 1 % IJ SOLN
5.0000 mL | Freq: Once | INTRAMUSCULAR | Status: AC
  Administered 2023-10-28: 5 mL

## 2023-10-28 MED ORDER — BETAMETHASONE SOD PHOS & ACET 6 (3-3) MG/ML IJ SUSP
9.0000 mg | Freq: Once | INTRAMUSCULAR | Status: AC
  Administered 2023-10-28: 9 mg via INTRAMUSCULAR

## 2023-10-28 MED ORDER — IOHEXOL 180 MG/ML  SOLN
3.0000 mL | Freq: Once | INTRAMUSCULAR | Status: AC
  Administered 2023-10-28: 3 mL via EPIDURAL

## 2023-10-28 NOTE — Patient Instructions (Signed)
 May resume eliquis  tomorrow  You received an epidural steroid injection under fluoroscopic guidance. This is the most accurate way to perform an epidural injection. This injection was performed to relieve thigh or leg or foot pain that may be related to a pinched nerve in the lumbar spine. The local anesthetic injected today may cause numbness in your leg for a couple hours. If it is severe we may need to observe you for 30-60 minutes after the injection. The cortisone medicine injected today may take several days to take full effect. This medicine can also cause facial flushing or feeling of being warm.  This injection may last for days weeks or months. It can be repeated if needed. If it is not effective, another spinal level may need to be injected. Other treatments include medication management as well as physical therapy. In some cases surgery may be an option.

## 2023-10-28 NOTE — Progress Notes (Signed)
 Left paramedian L4-5 Lumbar epidural steroid injection under fluoroscopic guidance  Indication: Lumbosacral radiculitis is not relieved by medication management or other conservative care and interfering with self-care and mobility.  No  anticoagulant use.  Off Eliquis  for 3 d prior to procedure , may resume in am   Informed consent was obtained after describing risk and benefits of the procedure with the patient, this includes bleeding, bruising, infection, paralysis and medication side effects.  The patient wishes to proceed and has given written consent.  Patient was placed in a prone position.  The lumbar area was marked and prepped with Betadine .  It was entered with a 25-gauge 1-1/2 inch needle and one mL of 1% lidocaine  was injected into the skin and subcutaneous tissue.  Then a 17-gauge spinal needle was inserted under fluoroscopic guidance into the L4-5  interlaminar space under AP and Lateral imaging.  Once needle tip of approximated the posterior elements, a loss of resistance technique was utilized with lateral imaging.  A positive loss of resistance was obtained and then confirmed by injecting 2 mL's of Omnipaque  180.  Then a solution containing 1.5 mL's of 6mg /ml Celestone  and 53mL's of 1% lidocaine  was injected.  The patient tolerated procedure well.  Post procedure instructions were given.  Please see post procedure form.  Omnipaque  180 2ml , 8ml waste Celestone  6mg  per ml 1.5 ml no waste Lidocaine  5ml MPF no waste

## 2023-10-28 NOTE — Progress Notes (Signed)
  PROCEDURE RECORD [facility]   Name: Brandon [name]. DOB:[birth year] MRN: [id number]  Date:10/28/2023  Physician: Prentice Compton, MD    Nurse/CMA: [facility] RN  Allergies:  Allergies  Allergen Reactions   Ace Inhibitors Cough   Albuterol  Other (See Comments)    Racing heart    Consent Signed: Yes.    Is patient diabetic? No.  CBG today?   Pregnant: No. LMP: No LMP for male patient. (age 65-55)  Anticoagulants: yes (APIXABAN ) held for 3 days Anti-inflammatory: no Antibiotics: no  Procedure: EPIDURAL STEROID INJECTION  Position: Prone Start Time: 10:42  End Time: 10:45  Fluoro Time: 29  RN/CMA Tenecia Ignasiak RN Rayvon Dakin RN    Time 10:04 10:53    BP 118/77 115/81    Pulse 69 69    Respirations 16 16    O2 Sat 96 95    S/S 6 6    Pain Level 9 9     D/C home with Therisa, patient A & O X 3, D/C instructions reviewed, and sits independently.

## 2023-10-29 ENCOUNTER — Telehealth: Payer: Self-pay

## 2023-10-29 ENCOUNTER — Encounter: Payer: Self-pay | Admitting: Physical Medicine & Rehabilitation

## 2023-10-29 NOTE — Progress Notes (Deleted)
 Referring Physician:  Wendolyn Jenkins Jansky, MD [address] [place],  [place] [address]  Primary Physician:  Wendolyn Jenkins Jansky, MD  History of Present Illness: 10/29/2023 Brandon [name] is here today with a chief complaint of ***  Low back pain Any leg pain?   Duration: *** Location: *** Quality: *** Severity: ***  Precipitating: aggravated by *** Modifying factors: made better by *** Weakness: none Timing: *** Bowel/Bladder Dysfunction: none  Conservative measures:  Physical therapy: *** Has not participated in PT, Had an initial visit at [facility] but never went back.  Multimodal medical therapy including regular antiinflammatories: *** Lyrica , Gabapentin , hydrocodone , Baclofen  Injections: 10/28/2023 Left paramedian L4-5 Lumbar ESI 10/29/2022 T12, L1, L2 Medial Branch Block 10/31/2019 Bilateral L3-L4, L4-L5 and L5-S1 Lumbar facet/medial branch block  09/28/2019 Bilateral L3-L4, L4-L5 and L5-S1 Lumbar facet/medial branch block   Past Surgery: ***none  Brandon B Kutzer Jr. has ***no symptoms of cervical myelopathy.  The symptoms are causing a significant impact on the patient's life.   I have utilized the care everywhere function in epic to review the outside records available from external health systems.  Review of Systems:  A 10 point review of systems is negative, except for the pertinent positives and negatives detailed in the HPI.  Past Medical History: Past Medical History:  Diagnosis Date   Allergy 01/05/1664   Anxiety 05/12/1996   Arthritis 07/30/1993   Benign neoplasm of colon    Benign prostatic hyperplasia with urinary obstruction    Bilateral inguinal hernia 05/29/2011   Bilateral pulmonary embolism (HCC) 03/2012   admitted to [facility],  treated with Xarelto    Breast lump 03/29/2019   Calculus of kidney 10/21/2016   Cataract    Chronic bilateral low back pain with sciatica 03/21/2012   Chronic pain    Finger mass, right 10/17/2020   History of  alcohol abuse 03/07/2016   History of colon polyps 11/18/2015   History of kidney stones    History of pulmonary embolism 03/19/2012   Hyperlipemia    Hypertension    Insomnia    Insomnia due to medical condition 12/23/2016   Polyuria secondary to BPH   Major depressive disorder, recurrent episode    Mitral regurgitation 04/24/2013   Mild by TEE   Morbid obesity (HCC) 03/07/2016   Myocardial infarction (HCC) 02/21/2003   Nonrheumatic mitral valve insufficiency 04/25/2013   Mild by TEE  Overview:  Overview:  Mild by TEE Overview:  Overview:  Overview:  Mild by TEE Overview:  Overview:  Mild by TEE   Obesity    OSA (obstructive sleep apnea)    noncompliant with CPAP.  07/27/13- awaiting a CPAP- unable to tolerate mask   Osteoporosis    Persistent atrial fibrillation (HCC)    Recurrent unilateral inguinal hernia 07/17/2013   Restless leg syndrome    takes gabapentin    S/P right total knee arthroplasty 05/24/2018   Sciatica    Sleep apnea    Spinal stenosis, lumbar region with neurogenic claudication    SVT (supraventricular tachycardia) 03/07/2016   Thrombus of left atrial appendage 03/09/2013   Uncomplicated asthma 06/20/2008   Overview:  Overview:  Overview:  Overview:  Qualifier: Diagnosis of  By: Krystal MD, Reyes LABOR Overview:  Overview:  Qualifier: Diagnosis of  By: Krystal MD, Reyes LABOR   Ventral hernia, unspecified, without mention of obstruction or gangrene    right abdominal wall    Past Surgical History: Past Surgical History:  Procedure Laterality Date  ATRIAL FIBRILLATION ABLATION N/A 02/27/2022   Procedure: ATRIAL FIBRILLATION ABLATION;  Surgeon: Nancey Eulas BRAVO, MD;  Location: [facility] INVASIVE CV LAB;  Service: Cardiovascular;  Laterality: N/A;   CARDIOVERSION N/A 03/21/2012   Procedure: CARDIOVERSION;  Surgeon: Salena GORMAN Negri, MD;  Location: [facility] ENDOSCOPY;  Service: Cardiovascular;  Laterality: N/A;   CARDIOVERSION N/A 04/24/2013   Procedure: CARDIOVERSION;  Surgeon:  Leim VEAR Moose, MD;  Location: [facility] ENDOSCOPY;  Service: Cardiovascular;  Laterality: N/A;   CARDIOVERSION N/A 06/27/2015   Procedure: CARDIOVERSION;  Surgeon: Ezra GORMAN Shuck, MD;  Location: [facility] ENDOSCOPY;  Service: Cardiovascular;  Laterality: N/A;   CARDIOVERSION N/A 11/11/2021   Procedure: CARDIOVERSION;  Surgeon: Pietro Redell GORMAN, MD;  Location: [facility] ENDOSCOPY;  Service: Cardiovascular;  Laterality: N/A;   CARDIOVERSION N/A 05/11/2022   Procedure: CARDIOVERSION;  Surgeon: Santo Stanly LABOR, MD;  Location: [facility] INVASIVE CV LAB;  Service: Cardiovascular;  Laterality: N/A;   CARDIOVERSION N/A 07/13/2022   Procedure: CARDIOVERSION;  Surgeon: Francyne Headland, MD;  Location: [facility] INVASIVE CV LAB;  Service: Cardiovascular;  Laterality: N/A;   CATARACT EXTRACTION     COLONOSCOPY W/ POLYPECTOMY     ELECTROPHYSIOLOGIC STUDY N/A 07/30/2015   Procedure: Atrial Fibrillation Ablation;  Surgeon: Lynwood Rakers, MD;  Location: [facility] INVASIVE CV LAB;  Service: Cardiovascular;  Laterality: N/A;   EXTRACORPOREAL SHOCK WAVE LITHOTRIPSY Right 10/29/2016   Procedure: RIGHT EXTRACORPOREAL SHOCK WAVE LITHOTRIPSY (ESWL);  Surgeon: Alvaro Hummer, MD;  Location: [facility] ORS;  Service: Urology;  Laterality: Right;   EYE SURGERY Right    cataract   FEMUR FRACTURE SURGERY     HERNIA REPAIR  07/06/2011   INGUINAL HERNIA REPAIR Right 07/28/2013   Procedure: RIGHT INGUINAL HERNIA REPAIR;  Surgeon: Donnice POUR. Belinda, MD;  Location: [facility] OR;  Service: General;  Laterality: Right;   INGUINAL HERNIA REPAIR Right 09/22/2016   Procedure: LAPAROSCOPIC RIGHT INGUINAL HERNIA;  Surgeon: Stevie Herlene Righter, MD;  Location: [facility] ORS;  Service: General;  Laterality: Right;  With MESH   INSERTION OF MESH Right 07/28/2013   Procedure: INSERTION OF MESH;  Surgeon: Donnice POUR. Tsuei, MD;  Location: [facility] OR;  Service: General;  Laterality: Right;   JOINT REPLACEMENT  Both knees 2018   KNEE ARTHROSCOPY     left   REPLACEMENT TOTAL KNEE Left     ROTATOR CUFF REPAIR     right   ROTATOR CUFF REPAIR Left 03/08/2014   DR SUPPLE   SHOULDER ARTHROSCOPY WITH ROTATOR CUFF REPAIR AND SUBACROMIAL DECOMPRESSION Left 03/08/2014   Procedure: LEFT SHOULDER ARTHROSCOPY WITH ROTATOR CUFF REPAIR/SUBACROMIAL DECOMPRESSION/DISTAL CLAVICLE RESECTION;  Surgeon: Franky CHRISTELLA Pointer, MD;  Location: [facility] OR;  Service: Orthopedics;  Laterality: Left;   SVT ABLATION N/A 01/23/2019   Procedure: SVT ABLATION;  Surgeon: Waddell Danelle ORN, MD;  Location: [facility] INVASIVE CV LAB;  Service: Cardiovascular;  Laterality: N/A;   TEE WITHOUT CARDIOVERSION N/A 03/21/2012   Procedure: TRANSESOPHAGEAL ECHOCARDIOGRAM (TEE);  Surgeon: Salena GORMAN Negri, MD;  Location: [facility] ENDOSCOPY;  Service: Cardiovascular;  Laterality: N/A;   TEE WITHOUT CARDIOVERSION N/A 04/24/2013   Procedure: TRANSESOPHAGEAL ECHOCARDIOGRAM (TEE);  Surgeon: Leim VEAR Moose, MD;  Location: [facility] ENDOSCOPY;  Service: Cardiovascular;  Laterality: N/A;   TEE WITHOUT CARDIOVERSION N/A 07/29/2015   Procedure: TRANSESOPHAGEAL ECHOCARDIOGRAM (TEE);  Surgeon: Vina LULLA Gull, MD;  Location: [facility] ENDOSCOPY;  Service: Cardiovascular;  Laterality: N/A;   TEE WITHOUT CARDIOVERSION N/A 05/11/2022   Procedure: TRANSESOPHAGEAL ECHOCARDIOGRAM;  Surgeon: Santo Stanly LABOR, MD;  Location: [facility] INVASIVE CV LAB;  Service: Cardiovascular;  Laterality: N/A;   TONSILLECTOMY     TOTAL KNEE ARTHROPLASTY Right 05/24/2018   Procedure: RIGHT TOTAL KNEE ARTHROPLASTY;  Surgeon: Ernie Cough, MD;  Location: [facility] ORS;  Service: Orthopedics;  Laterality: Right;  70 mins    Allergies: Allergies as of 11/08/2023 - Review Complete 10/28/2023  Allergen Reaction Noted   Ace inhibitors Cough 03/08/2013   Albuterol  Other (See Comments) 03/07/2016    Medications:  Current Outpatient Medications:    amLODipine  (NORVASC ) 5 MG tablet, Take 5 mg by mouth daily., Disp: , Rfl:    apixaban  (ELIQUIS ) 5 MG TABS tablet, Take 1 tablet (5 mg total) by mouth 2 (two)  times daily., Disp: 60 tablet, Rfl: 5   atorvastatin  (LIPITOR) 40 MG tablet, TAKE 1 TABLET BY MOUTH EVERY DAY, Disp: 90 tablet, Rfl: 1   baclofen  (LIORESAL ) 10 MG tablet, TAKE 0.5-1 TABLETS (5-10 MG TOTAL) BY MOUTH EVERY 8 (EIGHT) HOURS AS NEEDED FOR MUSCLE SPASMS., Disp: 180 tablet, Rfl: 1   buPROPion  (WELLBUTRIN  XL) 150 MG 24 hr tablet, TAKE 1 TABLET BY MOUTH EVERY DAY, Disp: 90 tablet, Rfl: 1   carvedilol  (COREG ) 25 MG tablet, TAKE 1 TABLET (25 MG TOTAL) BY MOUTH TWICE A DAY WITH MEALS, Disp: 120 tablet, Rfl: 0   eplerenone  (INSPRA ) 25 MG tablet, TAKE 1 TABLET (25 MG TOTAL) BY MOUTH DAILY., Disp: 90 tablet, Rfl: 1   escitalopram  (LEXAPRO ) 20 MG tablet, Take 1 tablet (20 mg total) by mouth daily., Disp: 90 tablet, Rfl: 1   fluticasone  (FLONASE ) 50 MCG/ACT nasal spray, SPRAY 2 SPRAYS INTO EACH NOSTRIL EVERY DAY, Disp: 48 mL, Rfl: 1   gabapentin  (NEURONTIN ) 400 MG capsule, Take 1 capsule (400 mg total) by mouth daily. Take 400mg  in morning, take 800mg  cap at night, Disp: 30 capsule, Rfl: 2   gabapentin  (NEURONTIN ) 800 MG tablet, Take 1 tablet (800 mg total) by mouth at bedtime., Disp: 90 tablet, Rfl: 6   ketoconazole  (NIZORAL ) 2 % cream, Apply 1 Application topically 2 (two) times daily., Disp: 60 g, Rfl: 0   memantine  (NAMENDA ) 10 MG tablet, Take 1 tablet (10 mg total) by mouth 2 (two) times daily., Disp: 180 tablet, Rfl: 1   methimazole  (TAPAZOLE ) 5 MG tablet, Take 2 tablets (10 mg total) by mouth daily., Disp: 180 tablet, Rfl: 3   potassium chloride  SA (KLOR-CON  M) 20 MEQ tablet, Take 2 tablets (40 mEq total) by mouth 2 (two) times daily., Disp: 360 tablet, Rfl: 1   Suzetrigine 50 MG TABS, Take 50 mg by mouth every 12 (twelve) hours as needed. Take two 50mg  capsules for your first dose at the start of a pain episode on an empty stomach, followed by 50mg  (1 capsule)  with or without food every 12 hours until pain episode is improved, Disp: 29 tablet, Rfl: 0   tamsulosin  (FLOMAX ) 0.4 MG CAPS capsule,  Take 1 capsule (0.4 mg total) by mouth daily. TAKE 1 CAPSULE BY MOUTH EVERY DAY. Patient to follow up with PCP prior to future refills, Disp: 90 capsule, Rfl: 3  Social History: Social History   Tobacco Use   Smoking status: Former    Current packs/day: 0.00    Average packs/day: 0.5 packs/day for 5.0 years (2.5 ttl pk-yrs)    Types: Cigarettes    Start date: 3    Quit date: 1976    Years since quitting: 49.8   Smokeless tobacco: Never   Tobacco comments:    Former smoker 03/27/22  Vaping Use  Vaping status: Never Used  Substance Use Topics   Alcohol use: No    Comment: Former EtOH abuse, stopped 06/2016   Drug use: Yes    Types: Marijuana    Comment: negative hx for IV drug abuse    Family Medical History: Family History  Problem Relation Age of Onset   Cancer Father        bone   Heart failure Mother    Hypertension Mother    Asthma Mother    Cancer Paternal Grandmother        ovarian   CVA Other        Fam Hx of multiple myeloma   Diabetes Other        Fam Hx of DM    Physical Examination: There were no vitals filed for this visit.  General: Patient is in no apparent distress. Attention to examination is appropriate.  Neck:   Supple.  Full range of motion.  Respiratory: Patient is breathing without any difficulty.   NEUROLOGICAL:     Awake, alert, oriented to person, place, and time.  Speech is clear and fluent.   Cranial Nerves: Pupils equal round and reactive to light.  Facial tone is symmetric.  Facial sensation is symmetric. Shoulder shrug is symmetric. Tongue protrusion is midline.    Strength: Side Biceps Triceps Deltoid Interossei Grip Wrist Ext. Wrist Flex.  R 5 5 5 5 5 5 5   L 5 5 5 5 5 5 5    Side Iliopsoas Quads Hamstring PF DF EHL  R 5 5 5 5 5 5   L 5 5 5 5 5 5    Reflexes are ***2+ and symmetric at the biceps, triceps, brachioradialis, patella and achilles.   Hoffman's is absent. Clonus is absent  Bilateral upper and lower extremity  sensation is intact to light touch ***.     No evidence of dysmetria noted.  Gait is normal.    Imaging: *** I have personally reviewed the images and agree with the above interpretation.  Medical Decision Making/Assessment and Plan: Mr. Eakins is a pleasant 73 y.o. male with ***  There are no diagnoses linked to this encounter.   Thank you for involving me in the care of this patient.    Penne MICAEL Sharps MD/MSCR Neurosurgery

## 2023-10-29 NOTE — Telephone Encounter (Signed)
 Patient calling into the office to report that he is feeling much better since the procedure L4-5 Lumbar epidural steroid injection under fluoroscopic guidance on 10/28/23.  Patient just wanted to make Dr. Carilyn aware.

## 2023-10-31 ENCOUNTER — Other Ambulatory Visit: Payer: Self-pay | Admitting: Physical Medicine & Rehabilitation

## 2023-10-31 ENCOUNTER — Other Ambulatory Visit: Payer: Self-pay | Admitting: Cardiovascular Disease

## 2023-11-01 ENCOUNTER — Encounter: Payer: Self-pay | Admitting: Cardiovascular Disease

## 2023-11-02 ENCOUNTER — Other Ambulatory Visit: Payer: Self-pay | Admitting: Cardiovascular Disease

## 2023-11-02 DIAGNOSIS — I480 Paroxysmal atrial fibrillation: Secondary | ICD-10-CM

## 2023-11-02 NOTE — Telephone Encounter (Signed)
 Prescription refill request for Eliquis  received. Indication: AF Last office visit: 05/26/23   C Fenton PA Scr: 0.76 on 08/12/23  Epic Age: 73 Weight: 125.5kg  Based on above findings Eliquis  5mg  twice daily is the appropriate dose.  Refill approved.

## 2023-11-08 ENCOUNTER — Encounter: Payer: Self-pay | Admitting: Family Medicine

## 2023-11-08 ENCOUNTER — Ambulatory Visit: Admitting: Family Medicine

## 2023-11-08 ENCOUNTER — Ambulatory Visit: Admitting: Neurosurgery

## 2023-11-08 VITALS — BP 114/72 | HR 68 | Temp 98.0°F | Ht 76.0 in | Wt 283.4 lb

## 2023-11-08 DIAGNOSIS — G2581 Restless legs syndrome: Secondary | ICD-10-CM

## 2023-11-08 DIAGNOSIS — Z6834 Body mass index (BMI) 34.0-34.9, adult: Secondary | ICD-10-CM | POA: Diagnosis not present

## 2023-11-08 DIAGNOSIS — R351 Nocturia: Secondary | ICD-10-CM

## 2023-11-08 DIAGNOSIS — E66811 Obesity, class 1: Secondary | ICD-10-CM

## 2023-11-08 DIAGNOSIS — E269 Hyperaldosteronism, unspecified: Secondary | ICD-10-CM | POA: Diagnosis not present

## 2023-11-08 DIAGNOSIS — G4733 Obstructive sleep apnea (adult) (pediatric): Secondary | ICD-10-CM | POA: Diagnosis not present

## 2023-11-08 DIAGNOSIS — I480 Paroxysmal atrial fibrillation: Secondary | ICD-10-CM | POA: Diagnosis not present

## 2023-11-08 DIAGNOSIS — R7303 Prediabetes: Secondary | ICD-10-CM

## 2023-11-08 DIAGNOSIS — E6609 Other obesity due to excess calories: Secondary | ICD-10-CM | POA: Diagnosis not present

## 2023-11-08 DIAGNOSIS — N401 Enlarged prostate with lower urinary tract symptoms: Secondary | ICD-10-CM

## 2023-11-08 DIAGNOSIS — F331 Major depressive disorder, recurrent, moderate: Secondary | ICD-10-CM

## 2023-11-08 DIAGNOSIS — M48062 Spinal stenosis, lumbar region with neurogenic claudication: Secondary | ICD-10-CM

## 2023-11-08 DIAGNOSIS — E785 Hyperlipidemia, unspecified: Secondary | ICD-10-CM

## 2023-11-08 DIAGNOSIS — I10 Essential (primary) hypertension: Secondary | ICD-10-CM

## 2023-11-08 LAB — CBC WITH DIFFERENTIAL/PLATELET
Basophils Absolute: 0.1 K/uL (ref 0.0–0.1)
Basophils Relative: 0.8 % (ref 0.0–3.0)
Eosinophils Absolute: 0.3 K/uL (ref 0.0–0.7)
Eosinophils Relative: 5.1 % — ABNORMAL HIGH (ref 0.0–5.0)
HCT: 44.1 % (ref 39.0–52.0)
Hemoglobin: 14.6 g/dL (ref 13.0–17.0)
Lymphocytes Relative: 22 % (ref 12.0–46.0)
Lymphs Abs: 1.5 K/uL (ref 0.7–4.0)
MCHC: 33 g/dL (ref 30.0–36.0)
MCV: 87.5 fl (ref 78.0–100.0)
Monocytes Absolute: 0.7 K/uL (ref 0.1–1.0)
Monocytes Relative: 11.2 % (ref 3.0–12.0)
Neutro Abs: 4 K/uL (ref 1.4–7.7)
Neutrophils Relative %: 60.9 % (ref 43.0–77.0)
Platelets: 168 K/uL (ref 150.0–400.0)
RBC: 5.04 Mil/uL (ref 4.22–5.81)
RDW: 16.6 % — ABNORMAL HIGH (ref 11.5–15.5)
WBC: 6.6 K/uL (ref 4.0–10.5)

## 2023-11-08 LAB — MAGNESIUM: Magnesium: 2.1 mg/dL (ref 1.5–2.5)

## 2023-11-08 LAB — COMPREHENSIVE METABOLIC PANEL WITH GFR
ALT: 18 U/L (ref 0–53)
AST: 15 U/L (ref 0–37)
Albumin: 4.3 g/dL (ref 3.5–5.2)
Alkaline Phosphatase: 123 U/L — ABNORMAL HIGH (ref 39–117)
BUN: 16 mg/dL (ref 6–23)
CO2: 34 meq/L — ABNORMAL HIGH (ref 19–32)
Calcium: 9.3 mg/dL (ref 8.4–10.5)
Chloride: 104 meq/L (ref 96–112)
Creatinine, Ser: 1.15 mg/dL (ref 0.40–1.50)
GFR: 62.31 mL/min (ref >60.00)
Glucose, Bld: 76 mg/dL (ref 70–99)
Potassium: 5.2 meq/L — ABNORMAL HIGH (ref 3.5–5.1)
Sodium: 145 meq/L (ref 135–145)
Total Bilirubin: 0.6 mg/dL (ref 0.2–1.2)
Total Protein: 7 g/dL (ref 6.0–8.3)

## 2023-11-08 MED ORDER — ZEPBOUND 2.5 MG/0.5ML ~~LOC~~ SOAJ: 2.5000 mg | SUBCUTANEOUS | 0 refills | Status: DC

## 2023-11-08 MED ORDER — ESCITALOPRAM OXALATE 20 MG PO TABS: 20.0000 mg | ORAL_TABLET | Freq: Every day | ORAL | 1 refills | Status: AC

## 2023-11-08 NOTE — Telephone Encounter (Signed)
 Is it ok for Referral?

## 2023-11-08 NOTE — Patient Instructions (Addendum)
 It was very nice to see you today!  See urologist.   Stool softener-metamucil daily, miralax , or colace   PLEASE NOTE:  If you had any lab tests please let us  know if you have not heard back within a few days. You may see your results on MyChart before we have a chance to review them but we will give you a call once they are reviewed by us . If we ordered any referrals today, please let us  know if you have not heard from their office within the next week.   Please try these tips to maintain a healthy lifestyle:  Eat most of your calories during the day when you are active. Eliminate processed foods including packaged sweets (pies, cakes, cookies), reduce intake of potatoes, white bread, white pasta, and white rice. Look for whole grain options, oat flour or almond flour.  Each meal should contain half fruits/vegetables, one quarter protein, and one quarter carbs (no bigger than a computer mouse).  Cut down on sweet beverages. This includes juice, soda, and sweet tea. Also watch fruit intake, though this is a healthier sweet option, it still contains natural sugar! Limit to 3 servings daily.  Drink at least 1 glass of water with each meal and aim for at least 8 glasses per day  Exercise at least 150 minutes every week.

## 2023-11-08 NOTE — Progress Notes (Signed)
 Subjective:     Patient ID: Brandon KATHEE Harvel [name]., male    DOB: 07/12/1950, 73 y.o.   MRN: [id number]  Chief Complaint  Patient presents with   Medication Refill    Pt is here for follow up  Afib,pe,htn,hld,dep, hyperald,predm,bph,cognit  Discussed the use of AI scribe software for clinical note transcription with the patient, who gave verbal consent to proceed.  2017  History of Present Illness Brandon B Bovenzi [name]. is a 73 year old male with hypertension, atrial fibrillation, and Graves disease who presents for medication management and follow-up.  He was recently diagnosed with Graves disease and is taking methimazole  10 mg daily. He manages hypertension with amlodipine  5 mg and carvedilol  25 mg, although he missed five days of carvedilol  due to a refill issue, which he has since resumed.  He experiences atrial fibrillation and is on Eliquis  for anticoagulation. He has episodes of heart racing and shortness of breath with activity, limiting his daily activities and causing fatigue.  He has a history of hyperaldosteronism and takes eplerenone  25 mg daily for fluid management, along with potassium supplements (two tablets twice daily).  He is under pain management for back issues and takes baclofen  and gabapentin , though he questions the efficacy of baclofen . He experiences significant back pain that limits his mobility, stating 'my back has gotten so bad I can't walk.'  He reports nocturia, getting up multiple times at night to urinate, and takes Flomax  once in the evening for prostate issues. He has adapted to sleeping in 2-3 hour segments throughout the day.  He describes episodes of dropping objects from his hands and experiencing body spasms, particularly in the evenings, which he associates with his evening medication routine, including gabapentin .  He has a history of sleep apnea and restless leg syndrome, diagnosed approximately 15 years ago, and does not use CPAP due to intolerance. He  experiences significant daytime fatigue  He reports memory issues, particularly with short-term memory He is currently taking bupropion  and escitalopram  for mood management.  RLS-on gabapentin   PreDM-He is concerned about weight gain and difficulty losing weight due to limited physical activity. He has reduced his portion sizes and eliminated alcohol from his diet but continues to gain weight.    There are no preventive care reminders to display for this patient.  Past Medical History:  Diagnosis Date   Allergy 01/05/1664   Anxiety 05/12/1996   Arthritis 07/30/1993   Benign neoplasm of colon    Benign prostatic hyperplasia with urinary obstruction    Bilateral inguinal hernia 05/29/2011   Bilateral pulmonary embolism (HCC) 03/2012   admitted to [facility],  treated with Xarelto    Breast lump 03/29/2019   Calculus of kidney 10/21/2016   Cataract    Chronic bilateral low back pain with sciatica 03/21/2012   Chronic pain    Finger mass, right 10/17/2020   Graves disease    History of alcohol abuse 03/07/2016   History of colon polyps 11/18/2015   History of kidney stones    History of pulmonary embolism 03/19/2012   Hyperlipemia    Hypertension    Insomnia    Insomnia due to medical condition 12/23/2016   Polyuria secondary to BPH   Major depressive disorder, recurrent episode    Mitral regurgitation 04/24/2013   Mild by TEE   Morbid obesity (HCC) 03/07/2016   Myocardial infarction (HCC) 02/21/2003   Nonrheumatic mitral valve insufficiency 04/25/2013   Mild by TEE  Overview:  Overview:  Mild by  TEE Overview:  Overview:  Overview:  Mild by TEE Overview:  Overview:  Mild by TEE   Obesity    OSA (obstructive sleep apnea)    noncompliant with CPAP.  07/27/13- awaiting a CPAP- unable to tolerate mask   Osteoporosis    Persistent atrial fibrillation (HCC)    Recurrent unilateral inguinal hernia 07/17/2013   Restless leg syndrome    takes gabapentin    S/P right total knee  arthroplasty 05/24/2018   Sciatica    Sleep apnea    Spinal stenosis, lumbar region with neurogenic claudication    SVT (supraventricular tachycardia) 03/07/2016   Thrombus of left atrial appendage 03/09/2013   Uncomplicated asthma 06/20/2008   Overview:  Overview:  Overview:  Overview:  Qualifier: Diagnosis of  By: Krystal MD, Reyes LABOR Overview:  Overview:  Qualifier: Diagnosis of  By: Krystal MD, Reyes LABOR   Ventral hernia, unspecified, without mention of obstruction or gangrene    right abdominal wall    Past Surgical History:  Procedure Laterality Date   ATRIAL FIBRILLATION ABLATION N/A 02/27/2022   Procedure: ATRIAL FIBRILLATION ABLATION;  Surgeon: Nancey Eulas BRAVO, MD;  Location: [facility] INVASIVE CV LAB;  Service: Cardiovascular;  Laterality: N/A;   CARDIOVERSION N/A 03/21/2012   Procedure: CARDIOVERSION;  Surgeon: Salena GORMAN Negri, MD;  Location: [facility] ENDOSCOPY;  Service: Cardiovascular;  Laterality: N/A;   CARDIOVERSION N/A 04/24/2013   Procedure: CARDIOVERSION;  Surgeon: Leim VEAR Moose, MD;  Location: [facility] ENDOSCOPY;  Service: Cardiovascular;  Laterality: N/A;   CARDIOVERSION N/A 06/27/2015   Procedure: CARDIOVERSION;  Surgeon: Ezra GORMAN Shuck, MD;  Location: [facility] ENDOSCOPY;  Service: Cardiovascular;  Laterality: N/A;   CARDIOVERSION N/A 11/11/2021   Procedure: CARDIOVERSION;  Surgeon: Pietro Redell GORMAN, MD;  Location: [facility] ENDOSCOPY;  Service: Cardiovascular;  Laterality: N/A;   CARDIOVERSION N/A 05/11/2022   Procedure: CARDIOVERSION;  Surgeon: Santo Stanly LABOR, MD;  Location: [facility] INVASIVE CV LAB;  Service: Cardiovascular;  Laterality: N/A;   CARDIOVERSION N/A 07/13/2022   Procedure: CARDIOVERSION;  Surgeon: Francyne Headland, MD;  Location: [facility] INVASIVE CV LAB;  Service: Cardiovascular;  Laterality: N/A;   CATARACT EXTRACTION     COLONOSCOPY W/ POLYPECTOMY     ELECTROPHYSIOLOGIC STUDY N/A 07/30/2015   Procedure: Atrial Fibrillation Ablation;  Surgeon: Lynwood Rakers, MD;  Location: [facility]  INVASIVE CV LAB;  Service: Cardiovascular;  Laterality: N/A;   EXTRACORPOREAL SHOCK WAVE LITHOTRIPSY Right 10/29/2016   Procedure: RIGHT EXTRACORPOREAL SHOCK WAVE LITHOTRIPSY (ESWL);  Surgeon: Alvaro Hummer, MD;  Location: [facility] ORS;  Service: Urology;  Laterality: Right;   EYE SURGERY Right    cataract   FEMUR FRACTURE SURGERY     HERNIA REPAIR  07/06/2011   INGUINAL HERNIA REPAIR Right 07/28/2013   Procedure: RIGHT INGUINAL HERNIA REPAIR;  Surgeon: Donnice POUR. Belinda, MD;  Location: [facility] OR;  Service: General;  Laterality: Right;   INGUINAL HERNIA REPAIR Right 09/22/2016   Procedure: LAPAROSCOPIC RIGHT INGUINAL HERNIA;  Surgeon: Stevie Herlene Righter, MD;  Location: [facility] ORS;  Service: General;  Laterality: Right;  With MESH   INSERTION OF MESH Right 07/28/2013   Procedure: INSERTION OF MESH;  Surgeon: Donnice POUR. Tsuei, MD;  Location: [facility] OR;  Service: General;  Laterality: Right;   JOINT REPLACEMENT  Both knees 2018   KNEE ARTHROSCOPY     left   REPLACEMENT TOTAL KNEE Left    ROTATOR CUFF REPAIR     right   ROTATOR CUFF REPAIR Left 03/08/2014   DR SUPPLE   SHOULDER ARTHROSCOPY WITH ROTATOR  CUFF REPAIR AND SUBACROMIAL DECOMPRESSION Left 03/08/2014   Procedure: LEFT SHOULDER ARTHROSCOPY WITH ROTATOR CUFF REPAIR/SUBACROMIAL DECOMPRESSION/DISTAL CLAVICLE RESECTION;  Surgeon: Franky CHRISTELLA Pointer, MD;  Location: [facility] OR;  Service: Orthopedics;  Laterality: Left;   SVT ABLATION N/A 01/23/2019   Procedure: SVT ABLATION;  Surgeon: Waddell Danelle ORN, MD;  Location: [facility] INVASIVE CV LAB;  Service: Cardiovascular;  Laterality: N/A;   TEE WITHOUT CARDIOVERSION N/A 03/21/2012   Procedure: TRANSESOPHAGEAL ECHOCARDIOGRAM (TEE);  Surgeon: Salena GORMAN Negri, MD;  Location: [facility] ENDOSCOPY;  Service: Cardiovascular;  Laterality: N/A;   TEE WITHOUT CARDIOVERSION N/A 04/24/2013   Procedure: TRANSESOPHAGEAL ECHOCARDIOGRAM (TEE);  Surgeon: Leim VEAR Moose, MD;  Location: [facility] ENDOSCOPY;  Service: Cardiovascular;  Laterality: N/A;    TEE WITHOUT CARDIOVERSION N/A 07/29/2015   Procedure: TRANSESOPHAGEAL ECHOCARDIOGRAM (TEE);  Surgeon: Vina LULLA Gull, MD;  Location: [facility] ENDOSCOPY;  Service: Cardiovascular;  Laterality: N/A;   TEE WITHOUT CARDIOVERSION N/A 05/11/2022   Procedure: TRANSESOPHAGEAL ECHOCARDIOGRAM;  Surgeon: Santo Stanly LABOR, MD;  Location: [facility] INVASIVE CV LAB;  Service: Cardiovascular;  Laterality: N/A;   TONSILLECTOMY     TOTAL KNEE ARTHROPLASTY Right 05/24/2018   Procedure: RIGHT TOTAL KNEE ARTHROPLASTY;  Surgeon: Ernie Cough, MD;  Location: [facility] ORS;  Service: Orthopedics;  Laterality: Right;  70 mins     Current Outpatient Medications:    amLODipine  (NORVASC ) 5 MG tablet, TAKE 1 TABLET (5 MG TOTAL) BY MOUTH DAILY., Disp: 30 tablet, Rfl: 0   atorvastatin  (LIPITOR) 40 MG tablet, TAKE 1 TABLET BY MOUTH EVERY DAY, Disp: 90 tablet, Rfl: 1   baclofen  (LIORESAL ) 10 MG tablet, TAKE 1/2-1 TABLET BY MOUTH EVERY 8 (EIGHT) HOURS AS NEEDED FOR MUSCLE SPASMS., Disp: 180 tablet, Rfl: 1   buPROPion  (WELLBUTRIN  XL) 150 MG 24 hr tablet, TAKE 1 TABLET BY MOUTH EVERY DAY, Disp: 90 tablet, Rfl: 1   carvedilol  (COREG ) 25 MG tablet, TAKE 1 TABLET (25 MG TOTAL) BY MOUTH TWICE A DAY WITH MEALS, Disp: 120 tablet, Rfl: 0   ELIQUIS  5 MG TABS tablet, TAKE 1 TABLET BY MOUTH TWICE A DAY, Disp: 60 tablet, Rfl: 5   eplerenone  (INSPRA ) 25 MG tablet, TAKE 1 TABLET (25 MG TOTAL) BY MOUTH DAILY., Disp: 90 tablet, Rfl: 1   fluticasone  (FLONASE ) 50 MCG/ACT nasal spray, SPRAY 2 SPRAYS INTO EACH NOSTRIL EVERY DAY, Disp: 48 mL, Rfl: 1   gabapentin  (NEURONTIN ) 400 MG capsule, Take 1 capsule (400 mg total) by mouth daily. Take 400mg  in morning, take 800mg  cap at night, Disp: 30 capsule, Rfl: 2   gabapentin  (NEURONTIN ) 800 MG tablet, Take 1 tablet (800 mg total) by mouth at bedtime., Disp: 90 tablet, Rfl: 6   ketoconazole  (NIZORAL ) 2 % cream, Apply 1 Application topically 2 (two) times daily., Disp: 60 g, Rfl: 0   memantine  (NAMENDA ) 10 MG tablet,  Take 1 tablet (10 mg total) by mouth 2 (two) times daily., Disp: 180 tablet, Rfl: 1   methimazole  (TAPAZOLE ) 5 MG tablet, Take 2 tablets (10 mg total) by mouth daily., Disp: 180 tablet, Rfl: 3   potassium chloride  SA (KLOR-CON  M) 20 MEQ tablet, Take 2 tablets (40 mEq total) by mouth 2 (two) times daily., Disp: 360 tablet, Rfl: 1   Suzetrigine 50 MG TABS, Take 50 mg by mouth every 12 (twelve) hours as needed. Take two 50mg  capsules for your first dose at the start of a pain episode on an empty stomach, followed by 50mg  (1 capsule)  with or without food every 12 hours until pain episode  is improved, Disp: 29 tablet, Rfl: 0   tamsulosin  (FLOMAX ) 0.4 MG CAPS capsule, Take 1 capsule (0.4 mg total) by mouth daily. TAKE 1 CAPSULE BY MOUTH EVERY DAY. Patient to follow up with PCP prior to future refills, Disp: 90 capsule, Rfl: 3   tirzepatide (ZEPBOUND) 2.5 MG/0.5ML Pen, Inject 2.5 mg into the skin once a week., Disp: 2 mL, Rfl: 0   escitalopram  (LEXAPRO ) 20 MG tablet, Take 1 tablet (20 mg total) by mouth daily., Disp: 90 tablet, Rfl: 1  Allergies  Allergen Reactions   Ace Inhibitors Cough   Albuterol  Other (See Comments)    Racing heart   ROS neg/noncontributory except as noted HPI/below      Objective:     BP 114/72 (BP Location: Left Arm, Patient Position: Sitting, Cuff Size: Large)   Pulse 68   Temp 98 F (36.7 C) (Temporal)   Ht 6' 4 (1.93 m)   Wt 283 lb 6 oz (128.5 kg)   SpO2 94%   BMI 34.49 kg/m  Wt Readings from Last 3 Encounters:  11/08/23 283 lb 6 oz (128.5 kg)  10/28/23 282 lb (127.9 kg)  10/11/23 285 lb 9.6 oz (129.5 kg)    Physical Exam GENERAL: Well developed well nourished no acute distress HEAD EYES EARS NOSE THROAT: Normocephalic atraumatic, conjunctiva not injected, sclera nonicteric CARDIAC: irreg rate and rhythm, S1 S2 present , no murmur, dorsalis pedis 1 plus bilaterally NECK: Supple, no thyromegaly, no nodes, no carotid bruits LUNGS: Clear to auscultation  bilaterally, no wheezes ABDOMEN: Bowel sounds present, soft, non tender non distended, no hepatosplenomegaly, no masses EXTREMITIES: No edema MUSCULOSKELETAL: No gross abnormalities NEUROLOGICAL: Alert and oriented x3, cranial nerves II through XII intact PSYCHIATRIC: Normal mood, good eye contact   I personally spent a total of 50 minutes in the care of the patient today including preparing to see the patient, getting/reviewing separately obtained history, performing a medically appropriate exam/evaluation, counseling and educating, placing orders, documenting clinical information in the EHR, and independently interpreting results.     Assessment & Plan:  Paroxysmal atrial fibrillation (HCC)  Essential hypertension -     CBC with Differential/Platelet -     Comprehensive metabolic panel with GFR -     Magnesium   Hyperaldosteronism  Hyperlipidemia LDL goal <130  Prediabetes  Obstructive sleep apnea -     Zepbound; Inject 2.5 mg into the skin once a week.  Dispense: 2 mL; Refill: 0  Benign prostatic hyperplasia with nocturia -     Ambulatory referral to Urology  Moderate episode of recurrent major depressive disorder (HCC) -     Escitalopram  Oxalate; Take 1 tablet (20 mg total) by mouth daily.  Dispense: 90 tablet; Refill: 1  Restless leg syndrome -     Ambulatory referral to Neurology  Spinal stenosis, lumbar region with neurogenic claudication  Class 1 obesity due to excess calories with serious comorbidity and body mass index (BMI) of 34.0 to 34.9 in adult    Assessment and Plan Assessment & Plan Obesity with severe obstructive sleep apnea   Obesity with severe obstructive sleep apnea contributes to fatigue and difficulty with weight management. Weight loss could benefit back pain and overall health. Medicare coverage limitations for weight loss medications were discussed, and Zepbound was considered due to severe sleep apnea. Start Zepbound, a once-weekly injection, to  aid in weight loss. Discuss potential side effects including nausea, diarrhea, constipation, and the need for a stool softener. Ensure adequate nutrition to prevent hypoglycemia. Obtain  a copy of the 2017 sleep study to support the use of Zepbound.  Chronic back pain   Chronic back pain is managed with baclofen  and gabapentin . He reports significant pain and difficulty walking but is hesitant to change the current regimen despite discussion about potential new pain medication from the pain management doctor. Continue the current pain management regimen with baclofen  and gabapentin . Consider new pain medication if the current regimen becomes ineffective. Managed by pain mgmt  Paroxysmal atrial fibrillation   Paroxysmal atrial fibrillation is managed with carvedilol  for rate control and Eliquis  for anticoagulation. A recent lapse in carvedilol  intake due to refill issues poses a potential risk of rapid heart rate. Emphasize the importance of medication adherence. Resume carvedilol  20 mg daily for rate control and continue Eliquis  for anticoagulation. Schedule follow-up with the AFib clinic.  Essential hypertension   Essential hypertension is managed with amlodipine  and eplerenone . Continue amlodipine  5 mg daily and eplerenone  25 mg daily.  Hyperaldosteronism   Hyperaldosteronism is managed with eplerenone  and potassium supplementation. Potassium levels need monitoring due to high intake of potassium supplements. Continue potassium supplements, 2 tablets twice daily, and monitor potassium levels with blood work.  Benign prostatic hyperplasia with lower urinary tract symptoms   Benign prostatic hyperplasia with nocturia is managed with Flomax . He reports frequent nocturnal urination but has adapted to the sleep pattern. No recent urologist visit. Continue Flomax  once daily in the evening and consider a urologist consultation for further management.  Graves disease   Graves disease is managed with  methimazole . Continue methimazole  10 mg daily.Managed by endocrine  Hyperlipidemia   Hyperlipidemia is managed with atorvastatin . No recent lipid panel discussed. Continue atorvastatin  as prescribed.  Depression   Depression is managed with bupropion  and escitalopram . His mood appears stable with the current regimen. Continue bupropion  and escitalopram  as prescribed and renew the escitalopram  prescription.  General Health Maintenance   Weight management and the potential benefits of weight loss on overall health were discussed. He is not currently drinking alcohol and is consuming low-calorie meals. Encourage continued healthy eating habits and alcohol abstinence.  RLS-not ideal.  Requesting referral back to Dr. Vear     Return in about 3 months (around 02/08/2024) for chronic follow-up.  Jenkins CHRISTELLA Carrel, MD

## 2023-11-09 ENCOUNTER — Ambulatory Visit: Payer: Self-pay | Admitting: Family Medicine

## 2023-11-09 DIAGNOSIS — R748 Abnormal levels of other serum enzymes: Secondary | ICD-10-CM

## 2023-11-09 DIAGNOSIS — E875 Hyperkalemia: Secondary | ICD-10-CM

## 2023-11-09 NOTE — Progress Notes (Signed)
 Potassium a little high-decrease dose by 1 pill per day Alk phos a little high-repeat cmp in 2 weeks(order placed-just need to schedule lab appt)

## 2023-11-10 NOTE — Progress Notes (Signed)
 Called pt left message for return call smk

## 2023-11-11 NOTE — Progress Notes (Signed)
 Referring Physician:  Wendolyn Jenkins Jansky, MD [address] [place],  [place] [address]  Primary Physician:  Wendolyn Jenkins Jansky, MD  Discussed the use of AI scribe software for clinical note transcription with the patient, who gave verbal consent to proceed.  History of Present Illness Brandon [name]. is a 73 year old male who presents for evaluation of worsening chronic back pain. He experiences pounding pain in the lower back radiating into the legs, causing weakness and a sensation of instability. Standing straight intensifies the pain, and he leans forward for relief. Walking with a cane and taking slow steps provides slight alleviation. Previous treatments, including physical therapy, cortisone injections, and electronic stimulation, have not provided lasting relief. Recent cortisone injections were initially effective, but the pain returned quickly. Prolonged standing leads to significant pain, exhaustion, and lightheadedness. He relies on grocery delivery services due to difficulty walking in stores and struggled to return to his car after a gym visit. He denies diabetes.  Conservative measures:  Physical therapy: Has been working for multiple months with PT.  Multimodal medical therapy including regular antiinflammatories:  Lyrica , Gabapentin , hydrocodone , Baclofen  Injections: 10/28/2023 Left paramedian L4-5 Lumbar ESI 10/29/2022 T12, L1, L2 Medial Branch Block 10/31/2019 Bilateral L3-L4, L4-L5 and L5-S1 Lumbar facet/medial branch block  09/28/2019 Bilateral L3-L4, L4-L5 and L5-S1 Lumbar facet/medial branch block   Past Surgery: none  The symptoms are causing a significant impact on the patient's life.   I have utilized the care everywhere function in epic to review the outside records available from external health systems.  Review of Systems:  A 10 point review of systems is negative, except for the pertinent positives and negatives detailed in the HPI.  Past Medical  History: Past Medical History:  Diagnosis Date   Allergy 01/05/1664   Anxiety 05/12/1996   Arthritis 07/30/1993   Benign neoplasm of colon    Benign prostatic hyperplasia with urinary obstruction    Bilateral inguinal hernia 05/29/2011   Bilateral pulmonary embolism (HCC) 03/2012   admitted to [facility],  treated with Xarelto    Breast lump 03/29/2019   Calculus of kidney 10/21/2016   Cataract    Chronic bilateral low back pain with sciatica 03/21/2012   Chronic pain    Finger mass, right 10/17/2020   History of alcohol abuse 03/07/2016   History of colon polyps 11/18/2015   History of kidney stones    History of pulmonary embolism 03/19/2012   Hyperlipemia    Hypertension    Insomnia    Insomnia due to medical condition 12/23/2016   Polyuria secondary to BPH   Major depressive disorder, recurrent episode    Mitral regurgitation 04/24/2013   Mild by TEE   Morbid obesity (HCC) 03/07/2016   Myocardial infarction (HCC) 02/21/2003   Nonrheumatic mitral valve insufficiency 04/25/2013   Mild by TEE  Overview:  Overview:  Mild by TEE Overview:  Overview:  Overview:  Mild by TEE Overview:  Overview:  Mild by TEE   Obesity    OSA (obstructive sleep apnea)    noncompliant with CPAP.  07/27/13- awaiting a CPAP- unable to tolerate mask   Osteoporosis    Persistent atrial fibrillation (HCC)    Recurrent unilateral inguinal hernia 07/17/2013   Restless leg syndrome    takes gabapentin    S/P right total knee arthroplasty 05/24/2018   Sciatica    Sleep apnea    Spinal stenosis, lumbar region with neurogenic claudication    SVT (supraventricular tachycardia) 03/07/2016  Thrombus of left atrial appendage 03/09/2013   Uncomplicated asthma 06/20/2008   Overview:  Overview:  Overview:  Overview:  Qualifier: Diagnosis of  By: Krystal MD, Reyes LABOR Overview:  Overview:  Qualifier: Diagnosis of  By: Krystal MD, Reyes LABOR   Ventral hernia, unspecified, without mention of obstruction or gangrene     right abdominal wall    Past Surgical History: Past Surgical History:  Procedure Laterality Date   ATRIAL FIBRILLATION ABLATION N/A 02/27/2022   Procedure: ATRIAL FIBRILLATION ABLATION;  Surgeon: Nancey Eulas BRAVO, MD;  Location: [facility] INVASIVE CV LAB;  Service: Cardiovascular;  Laterality: N/A;   CARDIOVERSION N/A 03/21/2012   Procedure: CARDIOVERSION;  Surgeon: Salena GORMAN Negri, MD;  Location: [facility] ENDOSCOPY;  Service: Cardiovascular;  Laterality: N/A;   CARDIOVERSION N/A 04/24/2013   Procedure: CARDIOVERSION;  Surgeon: Leim VEAR Moose, MD;  Location: [facility] ENDOSCOPY;  Service: Cardiovascular;  Laterality: N/A;   CARDIOVERSION N/A 06/27/2015   Procedure: CARDIOVERSION;  Surgeon: Ezra GORMAN Shuck, MD;  Location: [facility] ENDOSCOPY;  Service: Cardiovascular;  Laterality: N/A;   CARDIOVERSION N/A 11/11/2021   Procedure: CARDIOVERSION;  Surgeon: Pietro Redell GORMAN, MD;  Location: [facility] ENDOSCOPY;  Service: Cardiovascular;  Laterality: N/A;   CARDIOVERSION N/A 05/11/2022   Procedure: CARDIOVERSION;  Surgeon: Santo Stanly LABOR, MD;  Location: [facility] CV [facility];  Service: Cardiovascular;  Laterality: N/A;   CARDIOVERSION N/A 07/13/2022   Procedure: CARDIOVERSION;  Surgeon: Francyne Headland, MD;  Location: [facility];  Service: Cardiovascular;  Laterality: N/A;   CATARACT EXTRACTION     COLONOSCOPY W/ POLYPECTOMY     ELECTROPHYSIOLOGIC STUDY N/A 07/30/2015   Procedure: Atrial Fibrillation Ablation;  Surgeon: Lynwood Rakers, MD;  Location: [facility] INVASIVE CV LAB;  Service: Cardiovascular;  Laterality: N/A;   EXTRACORPOREAL SHOCK WAVE LITHOTRIPSY Right 10/29/2016   Procedure: RIGHT EXTRACORPOREAL SHOCK WAVE LITHOTRIPSY (ESWL);  Surgeon: Alvaro Hummer, MD;  Location: [facility] ORS;  Service: Urology;  Laterality: Right;   EYE SURGERY Right    cataract   FEMUR FRACTURE SURGERY     HERNIA REPAIR  07/06/2011   INGUINAL HERNIA REPAIR Right 07/28/2013   Procedure: RIGHT INGUINAL HERNIA REPAIR;  Surgeon: Donnice POUR.  Belinda, MD;  Location: [facility] OR;  Service: General;  Laterality: Right;   INGUINAL HERNIA REPAIR Right 09/22/2016   Procedure: LAPAROSCOPIC RIGHT INGUINAL HERNIA;  Surgeon: Stevie Herlene Righter, MD;  Location: [facility] ORS;  Service: General;  Laterality: Right;  With MESH   INSERTION OF MESH Right 07/28/2013   Procedure: INSERTION OF MESH;  Surgeon: Donnice POUR. Tsuei, MD;  Location: [facility] OR;  Service: General;  Laterality: Right;   JOINT REPLACEMENT  Both knees 2018   KNEE ARTHROSCOPY     left   REPLACEMENT TOTAL KNEE Left    ROTATOR CUFF REPAIR     right   ROTATOR CUFF REPAIR Left 03/08/2014   DR SUPPLE   SHOULDER ARTHROSCOPY WITH ROTATOR CUFF REPAIR AND SUBACROMIAL DECOMPRESSION Left 03/08/2014   Procedure: LEFT SHOULDER ARTHROSCOPY WITH ROTATOR CUFF REPAIR/SUBACROMIAL DECOMPRESSION/DISTAL CLAVICLE RESECTION;  Surgeon: Franky CHRISTELLA Pointer, MD;  Location: [facility] OR;  Service: Orthopedics;  Laterality: Left;   SVT ABLATION N/A 01/23/2019   Procedure: SVT ABLATION;  Surgeon: Waddell Danelle ORN, MD;  Location: [facility] INVASIVE CV LAB;  Service: Cardiovascular;  Laterality: N/A;   TEE WITHOUT CARDIOVERSION N/A 03/21/2012   Procedure: TRANSESOPHAGEAL ECHOCARDIOGRAM (TEE);  Surgeon: Salena GORMAN Negri, MD;  Location: [facility] ENDOSCOPY;  Service: Cardiovascular;  Laterality: N/A;   TEE WITHOUT CARDIOVERSION N/A 04/24/2013  Procedure: TRANSESOPHAGEAL ECHOCARDIOGRAM (TEE);  Surgeon: Leim VEAR Moose, MD;  Location: [facility] ENDOSCOPY;  Service: Cardiovascular;  Laterality: N/A;   TEE WITHOUT CARDIOVERSION N/A 07/29/2015   Procedure: TRANSESOPHAGEAL ECHOCARDIOGRAM (TEE);  Surgeon: Vina LULLA Gull, MD;  Location: [facility] ENDOSCOPY;  Service: Cardiovascular;  Laterality: N/A;   TEE WITHOUT CARDIOVERSION N/A 05/11/2022   Procedure: TRANSESOPHAGEAL ECHOCARDIOGRAM;  Surgeon: Santo Stanly LABOR, MD;  Location: [facility]VASIVE CV LAB;  Service: Cardiovascular;  Laterality: N/A;   TONSILLECTOMY     TOTAL KNEE ARTHROPLASTY Right 05/24/2018   Procedure:  RIGHT TOTAL KNEE ARTHROPLASTY;  Surgeon: Ernie Cough, MD;  Location: [facility] ORS;  Service: Orthopedics;  Laterality: Right;  70 mins    Allergies: Allergies as of 11/08/2023 - Review Complete 10/28/2023  Allergen Reaction Noted   Ace inhibitors Cough 03/08/2013   Albuterol  Other (See Comments) 03/07/2016    Medications:  Current Outpatient Medications:    amLODipine  (NORVASC ) 5 MG tablet, Take 5 mg by mouth daily., Disp: , Rfl:    apixaban  (ELIQUIS ) 5 MG TABS tablet, Take 1 tablet (5 mg total) by mouth 2 (two) times daily., Disp: 60 tablet, Rfl: 5   atorvastatin  (LIPITOR) 40 MG tablet, TAKE 1 TABLET BY MOUTH EVERY DAY, Disp: 90 tablet, Rfl: 1   baclofen  (LIORESAL ) 10 MG tablet, TAKE 0.5-1 TABLETS (5-10 MG TOTAL) BY MOUTH EVERY 8 (EIGHT) HOURS AS NEEDED FOR MUSCLE SPASMS., Disp: 180 tablet, Rfl: 1   buPROPion  (WELLBUTRIN  XL) 150 MG 24 hr tablet, TAKE 1 TABLET BY MOUTH EVERY DAY, Disp: 90 tablet, Rfl: 1   carvedilol  (COREG ) 25 MG tablet, TAKE 1 TABLET (25 MG TOTAL) BY MOUTH TWICE A DAY WITH MEALS, Disp: 120 tablet, Rfl: 0   eplerenone  (INSPRA ) 25 MG tablet, TAKE 1 TABLET (25 MG TOTAL) BY MOUTH DAILY., Disp: 90 tablet, Rfl: 1   escitalopram  (LEXAPRO ) 20 MG tablet, Take 1 tablet (20 mg total) by mouth daily., Disp: 90 tablet, Rfl: 1   fluticasone  (FLONASE ) 50 MCG/ACT nasal spray, SPRAY 2 SPRAYS INTO EACH NOSTRIL EVERY DAY, Disp: 48 mL, Rfl: 1   gabapentin  (NEURONTIN ) 400 MG capsule, Take 1 capsule (400 mg total) by mouth daily. Take 400mg  in morning, take 800mg  cap at night, Disp: 30 capsule, Rfl: 2   gabapentin  (NEURONTIN ) 800 MG tablet, Take 1 tablet (800 mg total) by mouth at bedtime., Disp: 90 tablet, Rfl: 6   ketoconazole  (NIZORAL ) 2 % cream, Apply 1 Application topically 2 (two) times daily., Disp: 60 g, Rfl: 0   memantine  (NAMENDA ) 10 MG tablet, Take 1 tablet (10 mg total) by mouth 2 (two) times daily., Disp: 180 tablet, Rfl: 1   methimazole  (TAPAZOLE ) 5 MG tablet, Take 2 tablets (10 mg  total) by mouth daily., Disp: 180 tablet, Rfl: 3   potassium chloride  SA (KLOR-CON  M) 20 MEQ tablet, Take 2 tablets (40 mEq total) by mouth 2 (two) times daily., Disp: 360 tablet, Rfl: 1   Suzetrigine 50 MG TABS, Take 50 mg by mouth every 12 (twelve) hours as needed. Take two 50mg  capsules for your first dose at the start of a pain episode on an empty stomach, followed by 50mg  (1 capsule)  with or without food every 12 hours until pain episode is improved, Disp: 29 tablet, Rfl: 0   tamsulosin  (FLOMAX ) 0.4 MG CAPS capsule, Take 1 capsule (0.4 mg total) by mouth daily. TAKE 1 CAPSULE BY MOUTH EVERY DAY. Patient to follow up with PCP prior to future refills, Disp: 90 capsule, Rfl: 3  Social  History: Social History   Tobacco Use   Smoking status: Former    Current packs/day: 0.00    Average packs/day: 0.5 packs/day for 5.0 years (2.5 ttl pk-yrs)    Types: Cigarettes    Start date: 30    Quit date: 1976    Years since quitting: 49.8   Smokeless tobacco: Never   Tobacco comments:    Former smoker 03/27/22  Vaping Use   Vaping status: Never Used  Substance Use Topics   Alcohol use: No    Comment: Former EtOH abuse, stopped 06/2016   Drug use: Yes    Types: Marijuana    Comment: negative hx for IV drug abuse    Family Medical History: Family History  Problem Relation Age of Onset   Cancer Father        bone   Heart failure Mother    Hypertension Mother    Asthma Mother    Cancer Paternal Grandmother        ovarian   CVA Other        Fam Hx of multiple myeloma   Diabetes Other        Fam Hx of DM    Physical Examination: NEUROLOGICAL:     Awake, alert, oriented to person, place, and time.  Speech is clear and fluent.   Cranial Nerves: Pupils equal round and reactive to light.  Facial tone is symmetric.  Facial sensation is symmetric. Shoulder shrug is symmetric. Tongue protrusion is midline.    Strength:  Side Iliopsoas Quads Hamstring PF DF EHL  R 5 5 5 5 5 5   L 5 5 5  5 5 5    Reflexes are trace to 1+  Gait is antalgic, forward carriage  Imaging: Narrative & Impression  CLINICAL DATA:  Spinal stenosis, lumbar   EXAM: MRI LUMBAR SPINE WITHOUT CONTRAST   TECHNIQUE: Multiplanar, multisequence MR imaging of the lumbar spine was performed. No intravenous contrast was administered.   COMPARISON:  MRI of the lumbar spine June 23, 2020.   FINDINGS: Segmentation:  Standard.   Alignment:  Levocurvature.  No substantial sagittal subluxation.   Vertebrae: No evidence of acute fracture or discitis/osteomyelitis. Degenerative/discogenic endplate signal changes are greatest at L2-L3 and L3-L4.   Conus medullaris and cauda equina: Conus extends to the L1-L2 level. Conus and cauda equina appear normal.   Paraspinal and other soft tissues: Negative.   Disc levels:   T12-L1: Disc bulging endplate spurring, ligamentum flavum thickening and mild to moderate canal stenosis. Patent foramina. Findings are similar.   L1-L2: Mild disc bulging without significant stenosis.   L2-L3: Disc bulging and endplate spurring. Mild canal stenosis. No significant foraminal stenosis. Wrist findings are similar.   L3-L4: Disc bulging and ligamentum flavum thickening is acute with facet arthropathy. Resulting mild canal stenosis with mild right subarticular recess stenosis. Mild left foraminal stenosis. Findings are similar.   L4-L5: Mild disc bulging and endplate spurring with bilateral facet arthropathy. Resulting and mild right foraminal stenosis, similar. Patent canal.   L5-S1: Left greater than right facet arthropathy. Resulting mild left foraminal stenosis. Patent canal and right foramen.   IMPRESSION: Similar multilevel degenerative change, described above. No evidence of impingement.     Electronically Signed   By: Gilmore GORMAN Molt M.D.   On: 09/06/2023 04:18      I have personally reviewed the images and agree with the above  interpretation.  Assessment and Plan Assessment & Plan Degenerative lumbar scoliosis with chronic low back pain  and neurogenic claudication Chronic low back pain with neurogenic claudication due to degenerative lumbar scoliosis. Pain is severe, progressively worsening over ten years, exacerbated by standing and relieved by leaning forward. MRI shows mild stenosis without significant nerve compression. CT scan reveals degenerative scoliosis with abnormal spinal curvature. Differential diagnosis includes spinal deformity and degenerative changes. No evidence of spinal cancer. Discussed potential surgical options including decompression, fusion, and realignment. Considered spinal stimulator for pain management, though it does not address anatomical deformity. Discussed risks and benefits of reconstruction surgery, including a year-long recovery period. He is considering options based on personal preference and impact on quality of life. - Ordered scoliosis x-rays and flexion-extension x-rays to assess spinal alignment and potential slippage. - Referred to Dr. Clois for evaluation of reconstruction surgery if significant spinal deformity is confirmed. - Discussed potential use of spinal stimulator for pain management if surgery is not pursued.  Penne MICAEL Sharps MD/MSCR Neurosurgery

## 2023-11-12 ENCOUNTER — Other Ambulatory Visit: Payer: Self-pay | Admitting: Endocrinology

## 2023-11-12 DIAGNOSIS — E0500 Thyrotoxicosis with diffuse goiter without thyrotoxic crisis or storm: Secondary | ICD-10-CM

## 2023-11-23 ENCOUNTER — Other Ambulatory Visit

## 2023-11-23 LAB — TSH: TSH: 9.27 m[IU]/L — ABNORMAL HIGH (ref 0.40–4.50)

## 2023-11-23 LAB — T3, FREE: T3, Free: 2.5 pg/mL (ref 2.3–4.2)

## 2023-11-23 LAB — T4, FREE: Free T4: 0.6 ng/dL — ABNORMAL LOW (ref 0.8–1.8)

## 2023-11-24 ENCOUNTER — Ambulatory Visit: Payer: Self-pay | Admitting: Endocrinology

## 2023-11-24 ENCOUNTER — Other Ambulatory Visit: Payer: Self-pay

## 2023-11-24 ENCOUNTER — Encounter: Payer: Self-pay | Admitting: Neurosurgery

## 2023-11-24 ENCOUNTER — Other Ambulatory Visit (HOSPITAL_COMMUNITY): Payer: Self-pay

## 2023-11-24 ENCOUNTER — Telehealth: Payer: Self-pay

## 2023-11-24 ENCOUNTER — Ambulatory Visit: Admitting: Neurosurgery

## 2023-11-24 VITALS — BP 130/82 | Wt 289.2 lb

## 2023-11-24 DIAGNOSIS — M48062 Spinal stenosis, lumbar region with neurogenic claudication: Secondary | ICD-10-CM | POA: Diagnosis not present

## 2023-11-24 DIAGNOSIS — M4150 Other secondary scoliosis, site unspecified: Secondary | ICD-10-CM

## 2023-11-24 DIAGNOSIS — M4156 Other secondary scoliosis, lumbar region: Secondary | ICD-10-CM | POA: Diagnosis not present

## 2023-11-24 NOTE — Telephone Encounter (Signed)
 Pharmacy Patient Advocate Encounter   Received notification from Physician's Office that prior authorization for Zepbound 2.5MG /0.5ML pen-injectors is required/requested.   Insurance verification completed.   The patient is insured through [employer].   Per test claim: PA required; PA submitted to above mentioned insurance via Latent Key/confirmation #/[id number] Status is pending

## 2023-11-25 NOTE — H&P (View-Only) (Signed)
 Primary Care Physician: Wendolyn Jenkins Jansky, MD Referring Physician: Dr. Kelsie Primary EP: Dr Nancey   Brandon NOVAK [name] [name]. is a 73 y.o. male with a h/o afib, s/p ablation, maintaining SR, off amiodarone . He had one episode of afib in July, none since then He has gained 20 lbs since Covid inactivity. He had rt knee replacement in May.  He recently had 2 separet ER visits for SVT with RVR and very hypotensive. The first time in ER, he was given adenosine  and broke but with return of SVT, he was cardioverted. He went home only to have  the same scenario to repeat again the next day. This time he was kept and treated. These episodes  may have been set off by pt not having his coreg  25 mg bid for a week as he was waiting for refills. He was given amiodarone  IV but when he returned to SR, this was stopped and he was gotten back on his BB. Pt states that Dr. Waddell wanted to see him after discharge  to  discuss SVT ablation. CHA2DS2VASc of 2, on eliquis .  F/u afib clinic, 06/14/19. He had an SVT ablation that was successful and he has not had any further tachyarrhythmia's. Overall he reports that he id soing well. Undergoing PT in after math of rt knee replacement. No issues with his anticoagulation.   Follow up in the AF clinic 03/27/22. Patient was seen in follow up by Charlies Arthur on 10/21/21 and found to be in persistent afib. He underwent DCCV on 11/11/21 which was unsuccessful after 3 shocks. He was seen by Dr Nancey and underwent repeat ablation on 02/27/22. He reports that he has done very well since the ablation with no afib episodes. He denies chest pain, swallowing pain, or groin issues.   Follow up in the AF clinic 05/07/22. Patient reports that for the past week+. He is currently in the process of moving and noted that he couldn't pack more than 2-3 boxes without getting very fatigued. He checked his Kardia mobile which showed rate controlled afib. There were no specific triggers that he could identify.  He did miss a dose of Eliquis  on 4/30.  Follow up 05/05/23. Patient returns for follow up for atrial fibrillation and flecainide  monitoring. He remains in rate controlled afib today and feels fatigued. He reports that he has stopped using marijuana. No missed doses of anticoagulation in the past 3 weeks.   Follow up 05/26/23. Patient was scheduled for DCCV on 05/19/23. His BP was elevated at that time and so he was given labetalol  and hydralazine . However, after the second dose of hydralazine  he became agitated and left before the DCCV could be completed. Patient reports that he left because he felt very anxious. He remains in rate controlled afib.   Follow up 11/26/23. Patient returns for follow up for atrial fibrillation. He reports that he has felt more fatigued and SOB over the past few months. He admits his back pain limits hs physical activity. Of note, his TSH was 9.27 and is seeing Dr Mercie soon to adjust his methimazole  dose. No bleeding issues on anticoagulation.   Today, he  denies symptoms of palpitations, chest pain, orthopnea, PND, lower extremity edema, dizziness, presyncope, syncope, snoring, daytime somnolence, bleeding, or neurologic sequela. The patient is tolerating medications without difficulties and is otherwise without complaint today.    Past Medical History:  Diagnosis Date   Allergy 01/05/1664   Anxiety 05/12/1996   Arthritis 07/30/1993  Benign neoplasm of colon    Benign prostatic hyperplasia with urinary obstruction    Bilateral inguinal hernia 05/29/2011   Bilateral pulmonary embolism (HCC) 03/2012   admitted to [facility],  treated with Xarelto    Breast lump 03/29/2019   Calculus of kidney 10/21/2016   Cataract    Chronic bilateral low back pain with sciatica 03/21/2012   Chronic pain    Finger mass, right 10/17/2020   Graves disease    History of alcohol abuse 03/07/2016   History of colon polyps 11/18/2015   History of kidney stones    History of  pulmonary embolism 03/19/2012   Hyperlipemia    Hypertension    Insomnia    Insomnia due to medical condition 12/23/2016   Polyuria secondary to BPH   Major depressive disorder, recurrent episode    Mitral regurgitation 04/24/2013   Mild by TEE   Morbid obesity (HCC) 03/07/2016   Myocardial infarction (HCC) 02/21/2003   Nonrheumatic mitral valve insufficiency 04/25/2013   Mild by TEE  Overview:  Overview:  Mild by TEE Overview:  Overview:  Overview:  Mild by TEE Overview:  Overview:  Mild by TEE   Obesity    OSA (obstructive sleep apnea)    noncompliant with CPAP.  07/27/13- awaiting a CPAP- unable to tolerate mask   Osteoporosis    Persistent atrial fibrillation (HCC)    Recurrent unilateral inguinal hernia 07/17/2013   Restless leg syndrome    takes gabapentin    S/P right total knee arthroplasty 05/24/2018   Sciatica    Sleep apnea    Spinal stenosis, lumbar region with neurogenic claudication    SVT (supraventricular tachycardia) 03/07/2016   Thrombus of left atrial appendage 03/09/2013   Uncomplicated asthma 06/20/2008   Overview:  Overview:  Overview:  Overview:  Qualifier: Diagnosis of  By: Krystal MD, Reyes LABOR Overview:  Overview:  Qualifier: Diagnosis of  By: Krystal MD, Reyes LABOR   Ventral hernia, unspecified, without mention of obstruction or gangrene    right abdominal wall   Current Outpatient Medications  Medication Sig Dispense Refill   amLODipine  (NORVASC ) 5 MG tablet TAKE 1 TABLET (5 MG TOTAL) BY MOUTH DAILY. 30 tablet 0   atorvastatin  (LIPITOR) 40 MG tablet TAKE 1 TABLET BY MOUTH EVERY DAY 90 tablet 1   baclofen  (LIORESAL ) 10 MG tablet TAKE 1/2-1 TABLET BY MOUTH EVERY 8 (EIGHT) HOURS AS NEEDED FOR MUSCLE SPASMS. 180 tablet 1   buPROPion  (WELLBUTRIN  XL) 150 MG 24 hr tablet TAKE 1 TABLET BY MOUTH EVERY DAY 90 tablet 1   carvedilol  (COREG ) 25 MG tablet TAKE 1 TABLET (25 MG TOTAL) BY MOUTH TWICE A DAY WITH MEALS 120 tablet 0   ELIQUIS  5 MG TABS tablet TAKE 1 TABLET BY  MOUTH TWICE A DAY 60 tablet 5   eplerenone  (INSPRA ) 25 MG tablet TAKE 1 TABLET (25 MG TOTAL) BY MOUTH DAILY. 90 tablet 1   escitalopram  (LEXAPRO ) 20 MG tablet Take 1 tablet (20 mg total) by mouth daily. 90 tablet 1   fluticasone  (FLONASE ) 50 MCG/ACT nasal spray SPRAY 2 SPRAYS INTO EACH NOSTRIL EVERY DAY 48 mL 1   gabapentin  (NEURONTIN ) 400 MG capsule Take 1 capsule (400 mg total) by mouth daily. Take 400mg  in morning, take 800mg  cap at night 30 capsule 2   gabapentin  (NEURONTIN ) 800 MG tablet Take 1 tablet (800 mg total) by mouth at bedtime. 90 tablet 6   ketoconazole  (NIZORAL ) 2 % cream Apply 1 Application topically 2 (two) times daily. 60  g 0   memantine  (NAMENDA ) 10 MG tablet Take 1 tablet (10 mg total) by mouth 2 (two) times daily. 180 tablet 1   methimazole  (TAPAZOLE ) 5 MG tablet Take 2 tablets (10 mg total) by mouth daily. 180 tablet 3   potassium chloride  SA (KLOR-CON  M) 20 MEQ tablet Take 2 tablets (40 mEq total) by mouth 2 (two) times daily. 360 tablet 1   tamsulosin  (FLOMAX ) 0.4 MG CAPS capsule Take 1 capsule (0.4 mg total) by mouth daily. TAKE 1 CAPSULE BY MOUTH EVERY DAY. Patient to follow up with PCP prior to future refills 90 capsule 3   No current facility-administered medications for this encounter.    ROS- All systems are reviewed and negative except as per the HPI above  Physical Exam: Vitals:   11/26/23 0826  BP: 104/80  Pulse: 73  SpO2: 95%  Weight: 129.7 kg  Height: 6' 4 (1.93 m)     Wt Readings from Last 3 Encounters:  11/26/23 129.7 kg  11/24/23 131.2 kg  11/08/23 128.5 kg     GEN: Well nourished, well developed in no acute distress CARDIAC: Irregularly irregular rate and rhythm, no murmurs, rubs, gallops RESPIRATORY:  Clear to auscultation without rales, wheezing or rhonchi  ABDOMEN: Soft, non-tender, non-distended EXTREMITIES:  No edema; No deformity    EKG Interpretation Date/Time:  Friday November 26 2023 08:28:51 EST Ventricular Rate:  73 PR  Interval:    QRS Duration:  108 QT Interval:  410 QTC Calculation: 451 R Axis:   -33  Text Interpretation: Atrial fibrillation with premature ventricular or aberrantly conducted complexes Left axis deviation Abnormal ECG When compared with ECG of 26-May-2023 08:39, No significant change was found Confirmed by Ofelia Podolski ([id number]) on 11/26/2023 8:34:54 AM    Echo 10/20/23  1. Left ventricular ejection fraction, by estimation, is 50 to 55%. The  left ventricle has low normal function. The left ventricle has no regional  wall motion abnormalities. There is moderate left ventricular hypertrophy.  Left ventricular diastolic function could not be evaluated.   2. Right ventricular systolic function is normal. The right ventricular  size is normal. There is normal pulmonary artery systolic pressure. The  estimated right ventricular systolic pressure is 15.3 mmHg.   3. Left atrial size was severely dilated.   4. The mitral valve is normal in structure. Mild mitral valve  regurgitation. No evidence of mitral stenosis.   5. The aortic valve was not well visualized. Aortic valve regurgitation  is mild. No aortic stenosis is present.   6. The inferior vena cava is dilated in size with >50% respiratory  variability, suggesting right atrial pressure of 8 mmHg.   7. Ascending aorta measurements are within normal limits for age when  indexed to body surface area.   Comparison(s): A prior study was performed on 05/11/2022. TEE reported  LVEF 55-60%, mild RAE, moderate MR, trival AR.    CHA2DS2-VASc Score = 3  The patient's score is based upon: CHF History: 0 HTN History: 1 Diabetes History: 0 Stroke History: 0 Vascular Disease History: 1 (aortic atherosclerosis) Age Score: 1 Gender Score: 0       ASSESSMENT AND PLAN: Persistent Atrial Fibrillation (ICD10:  I48.19) The patient's CHA2DS2-VASc score is 3, indicating a 3.2% annual risk of stroke.   S/p afib ablation 2017 and 02/27/22 Patient  previously declined future attempts for DCCV, flecainide  discontinued. However, patient has felt more fatigue and SOB and would like a rhythm control strategy.  Will resume flecainide  50  mg BID on 11/28 (patient missed a dose of Eliquis  2 weeks ago). Would not use higher dose with baseline prolonged PR. QT in SR long for dofetilide or sotalol.  Will plan for DCCV. Recent labs reviewed. Long term, he is interested in PFA. Patient would like to discuss with Dr Nancey, will refer.  Continue carvedilol  25 mg BID Continue Eliquis  5 mg BID  Secondary Hypercoagulable State (ICD10:  D68.69) The patient is at significant risk for stroke/thromboembolism based upon his CHA2DS2-VASc Score of 3.  Continue Apixaban  (Eliquis ). No bleeding issues.   HTN Stable on current regimen  SVT AVNRT s/p ablation  OSA  Encouraged nightly CPAP   Follow up with Dr Nancey post DCCV.   Informed Consent   Shared Decision Making/Informed Consent The risks (stroke, cardiac arrhythmias rarely resulting in the need for a temporary or permanent pacemaker, skin irritation or burns and complications associated with conscious sedation including aspiration, arrhythmia, respiratory failure and death), benefits (restoration of normal sinus rhythm) and alternatives of a direct current cardioversion were explained in detail to Brandon [name] and he agrees to proceed.       Daril Kicks PA-C [facility] [address] [place], [place] [address] [phone]

## 2023-11-25 NOTE — Progress Notes (Signed)
 Primary Care Physician: Wendolyn Jenkins Jansky, MD Referring Physician: Dr. Kelsie Primary EP: Dr Nancey   Brandon Brandon [name]. is a 73 y.o. male with a h/o afib, s/p ablation, maintaining SR, off amiodarone . He had one episode of afib in July, none since then He has gained 20 lbs since Covid inactivity. He had rt knee replacement in May.  He recently had 2 separet ER visits for SVT with RVR and very hypotensive. The first time in ER, he was given adenosine  and broke but with return of SVT, he was cardioverted. He went home only to have  the same scenario to repeat again the next day. This time he was kept and treated. These episodes  may have been set off by pt not having his coreg  25 mg bid for a week as he was waiting for refills. He was given amiodarone  IV but when he returned to SR, this was stopped and he was gotten back on his BB. Pt states that Dr. Waddell wanted to see him after discharge  to  discuss SVT ablation. CHA2DS2VASc of 2, on eliquis .  F/u afib clinic, 06/14/19. He had an SVT ablation that was successful and he has not had any further tachyarrhythmia's. Overall he reports that he id soing well. Undergoing PT in after math of rt knee replacement. No issues with his anticoagulation.   Follow up in the AF clinic 03/27/22. Patient was seen in follow up by Charlies Arthur on 10/21/21 and found to be in persistent afib. He underwent DCCV on 11/11/21 which was unsuccessful after 3 shocks. He was seen by Dr Nancey and underwent repeat ablation on 02/27/22. He reports that he has done very well since the ablation with no afib episodes. He denies chest pain, swallowing pain, or groin issues.   Follow up in the AF clinic 05/07/22. Patient reports that for the past week+. He is currently in the process of moving and noted that he couldn't pack more than 2-3 boxes without getting very fatigued. He checked his Kardia mobile which showed rate controlled afib. There were no specific triggers that he could identify.  He did miss a dose of Eliquis  on 4/30.  Follow up 05/05/23. Patient returns for follow up for atrial fibrillation and flecainide  monitoring. He remains in rate controlled afib today and feels fatigued. He reports that he has stopped using marijuana. No missed doses of anticoagulation in the past 3 weeks.   Follow up 05/26/23. Patient was scheduled for DCCV on 05/19/23. His BP was elevated at that time and so he was given labetalol  and hydralazine . However, after the second dose of hydralazine  he became agitated and left before the DCCV could be completed. Patient reports that he left because he felt very anxious. He remains in rate controlled afib.   Follow up 11/26/23. Patient returns for follow up for atrial fibrillation. He reports that he has felt more fatigued and SOB over the past few months. He admits his back pain limits hs physical activity. Of note, his TSH was 9.27 and is seeing Dr Mercie soon to adjust his methimazole  dose. No bleeding issues on anticoagulation.   Today, he  denies symptoms of palpitations, chest pain, orthopnea, PND, lower extremity edema, dizziness, presyncope, syncope, snoring, daytime somnolence, bleeding, or neurologic sequela. The patient is tolerating medications without difficulties and is otherwise without complaint today.    Past Medical History:  Diagnosis Date   Allergy 01/05/1664   Anxiety 05/12/1996   Arthritis 07/30/1993  Benign neoplasm of colon    Benign prostatic hyperplasia with urinary obstruction    Bilateral inguinal hernia 05/29/2011   Bilateral pulmonary embolism (HCC) 03/2012   admitted to [facility],  treated with Xarelto    Breast lump 03/29/2019   Calculus of kidney 10/21/2016   Cataract    Chronic bilateral low back pain with sciatica 03/21/2012   Chronic pain    Finger mass, right 10/17/2020   Graves disease    History of alcohol abuse 03/07/2016   History of colon polyps 11/18/2015   History of kidney stones    History of  pulmonary embolism 03/19/2012   Hyperlipemia    Hypertension    Insomnia    Insomnia due to medical condition 12/23/2016   Polyuria secondary to BPH   Major depressive disorder, recurrent episode    Mitral regurgitation 04/24/2013   Mild by TEE   Morbid obesity (HCC) 03/07/2016   Myocardial infarction (HCC) 02/21/2003   Nonrheumatic mitral valve insufficiency 04/25/2013   Mild by TEE  Overview:  Overview:  Mild by TEE Overview:  Overview:  Overview:  Mild by TEE Overview:  Overview:  Mild by TEE   Obesity    OSA (obstructive sleep apnea)    noncompliant with CPAP.  07/27/13- awaiting a CPAP- unable to tolerate mask   Osteoporosis    Persistent atrial fibrillation (HCC)    Recurrent unilateral inguinal hernia 07/17/2013   Restless leg syndrome    takes gabapentin    S/P right total knee arthroplasty 05/24/2018   Sciatica    Sleep apnea    Spinal stenosis, lumbar region with neurogenic claudication    SVT (supraventricular tachycardia) 03/07/2016   Thrombus of left atrial appendage 03/09/2013   Uncomplicated asthma 06/20/2008   Overview:  Overview:  Overview:  Overview:  Qualifier: Diagnosis of  By: Krystal MD, Reyes LABOR Overview:  Overview:  Qualifier: Diagnosis of  By: Krystal MD, Reyes LABOR   Ventral hernia, unspecified, without mention of obstruction or gangrene    right abdominal wall   Current Outpatient Medications  Medication Sig Dispense Refill   amLODipine  (NORVASC ) 5 MG tablet TAKE 1 TABLET (5 MG TOTAL) BY MOUTH DAILY. 30 tablet 0   atorvastatin  (LIPITOR) 40 MG tablet TAKE 1 TABLET BY MOUTH EVERY DAY 90 tablet 1   baclofen  (LIORESAL ) 10 MG tablet TAKE 1/2-1 TABLET BY MOUTH EVERY 8 (EIGHT) HOURS AS NEEDED FOR MUSCLE SPASMS. 180 tablet 1   buPROPion  (WELLBUTRIN  XL) 150 MG 24 hr tablet TAKE 1 TABLET BY MOUTH EVERY DAY 90 tablet 1   carvedilol  (COREG ) 25 MG tablet TAKE 1 TABLET (25 MG TOTAL) BY MOUTH TWICE A DAY WITH MEALS 120 tablet 0   ELIQUIS  5 MG TABS tablet TAKE 1 TABLET BY  MOUTH TWICE A DAY 60 tablet 5   eplerenone  (INSPRA ) 25 MG tablet TAKE 1 TABLET (25 MG TOTAL) BY MOUTH DAILY. 90 tablet 1   escitalopram  (LEXAPRO ) 20 MG tablet Take 1 tablet (20 mg total) by mouth daily. 90 tablet 1   fluticasone  (FLONASE ) 50 MCG/ACT nasal spray SPRAY 2 SPRAYS INTO EACH NOSTRIL EVERY DAY 48 mL 1   gabapentin  (NEURONTIN ) 400 MG capsule Take 1 capsule (400 mg total) by mouth daily. Take 400mg  in morning, take 800mg  cap at night 30 capsule 2   gabapentin  (NEURONTIN ) 800 MG tablet Take 1 tablet (800 mg total) by mouth at bedtime. 90 tablet 6   ketoconazole  (NIZORAL ) 2 % cream Apply 1 Application topically 2 (two) times daily. 60  g 0   memantine  (NAMENDA ) 10 MG tablet Take 1 tablet (10 mg total) by mouth 2 (two) times daily. 180 tablet 1   methimazole  (TAPAZOLE ) 5 MG tablet Take 2 tablets (10 mg total) by mouth daily. 180 tablet 3   potassium chloride  SA (KLOR-CON  M) 20 MEQ tablet Take 2 tablets (40 mEq total) by mouth 2 (two) times daily. 360 tablet 1   tamsulosin  (FLOMAX ) 0.4 MG CAPS capsule Take 1 capsule (0.4 mg total) by mouth daily. TAKE 1 CAPSULE BY MOUTH EVERY DAY. Patient to follow up with PCP prior to future refills 90 capsule 3   No current facility-administered medications for this encounter.    ROS- All systems are reviewed and negative except as per the HPI above  Physical Exam: Vitals:   11/26/23 0826  BP: 104/80  Pulse: 73  SpO2: 95%  Weight: 129.7 kg  Height: 6' 4 (1.93 m)     Wt Readings from Last 3 Encounters:  11/26/23 129.7 kg  11/24/23 131.2 kg  11/08/23 128.5 kg     GEN: Well nourished, well developed in no acute distress CARDIAC: Irregularly irregular rate and rhythm, no murmurs, rubs, gallops RESPIRATORY:  Clear to auscultation without rales, wheezing or rhonchi  ABDOMEN: Soft, non-tender, non-distended EXTREMITIES:  No edema; No deformity    EKG Interpretation Date/Time:  Friday November 26 2023 08:28:51 EST Ventricular Rate:  73 PR  Interval:    QRS Duration:  108 QT Interval:  410 QTC Calculation: 451 R Axis:   -33  Text Interpretation: Atrial fibrillation with premature ventricular or aberrantly conducted complexes Left axis deviation Abnormal ECG When compared with ECG of 26-May-2023 08:39, No significant change was found Confirmed by Ofelia Podolski ([id number]) on 11/26/2023 8:34:54 AM    Echo 10/20/23  1. Left ventricular ejection fraction, by estimation, is 50 to 55%. The  left ventricle has low normal function. The left ventricle has no regional  wall motion abnormalities. There is moderate left ventricular hypertrophy.  Left ventricular diastolic function could not be evaluated.   2. Right ventricular systolic function is normal. The right ventricular  size is normal. There is normal pulmonary artery systolic pressure. The  estimated right ventricular systolic pressure is 15.3 mmHg.   3. Left atrial size was severely dilated.   4. The mitral valve is normal in structure. Mild mitral valve  regurgitation. No evidence of mitral stenosis.   5. The aortic valve was not well visualized. Aortic valve regurgitation  is mild. No aortic stenosis is present.   6. The inferior vena cava is dilated in size with >50% respiratory  variability, suggesting right atrial pressure of 8 mmHg.   7. Ascending aorta measurements are within normal limits for age when  indexed to body surface area.   Comparison(s): A prior study was performed on 05/11/2022. TEE reported  LVEF 55-60%, mild RAE, moderate MR, trival AR.    CHA2DS2-VASc Score = 3  The patient's score is based upon: CHF History: 0 HTN History: 1 Diabetes History: 0 Stroke History: 0 Vascular Disease History: 1 (aortic atherosclerosis) Age Score: 1 Gender Score: 0       ASSESSMENT AND PLAN: Persistent Atrial Fibrillation (ICD10:  I48.19) The patient's CHA2DS2-VASc score is 3, indicating a 3.2% annual risk of stroke.   S/p afib ablation 2017 and 02/27/22 Patient  previously declined future attempts for DCCV, flecainide  discontinued. However, patient has felt more fatigue and SOB and would like a rhythm control strategy.  Will resume flecainide  50  mg BID on 11/28 (patient missed a dose of Eliquis  2 weeks ago). Would not use higher dose with baseline prolonged PR. QT in SR long for dofetilide or sotalol.  Will plan for DCCV. Recent labs reviewed. Long term, he is interested in PFA. Patient would like to discuss with Dr Nancey, will refer.  Continue carvedilol  25 mg BID Continue Eliquis  5 mg BID  Secondary Hypercoagulable State (ICD10:  D68.69) The patient is at significant risk for stroke/thromboembolism based upon his CHA2DS2-VASc Score of 3.  Continue Apixaban  (Eliquis ). No bleeding issues.   HTN Stable on current regimen  SVT AVNRT s/p ablation  OSA  Encouraged nightly CPAP   Follow up with Dr Nancey post DCCV.   Informed Consent   Shared Decision Making/Informed Consent The risks (stroke, cardiac arrhythmias rarely resulting in the need for a temporary or permanent pacemaker, skin irritation or burns and complications associated with conscious sedation including aspiration, arrhythmia, respiratory failure and death), benefits (restoration of normal sinus rhythm) and alternatives of a direct current cardioversion were explained in detail to Brandon [name] and he agrees to proceed.       Daril Kicks PA-C [facility] [address] [place], [place] [address] [phone]

## 2023-11-26 ENCOUNTER — Ambulatory Visit (INDEPENDENT_AMBULATORY_CARE_PROVIDER_SITE_OTHER): Admitting: Endocrinology

## 2023-11-26 ENCOUNTER — Encounter (HOSPITAL_COMMUNITY): Payer: Self-pay | Admitting: *Deleted

## 2023-11-26 ENCOUNTER — Other Ambulatory Visit (HOSPITAL_COMMUNITY): Payer: Self-pay | Admitting: *Deleted

## 2023-11-26 ENCOUNTER — Encounter: Payer: Self-pay | Admitting: Endocrinology

## 2023-11-26 ENCOUNTER — Ambulatory Visit (HOSPITAL_COMMUNITY)
Admission: RE | Admit: 2023-11-26 | Discharge: 2023-11-26 | Disposition: A | Discharge status: Home / Self Care | Source: Ambulatory Visit | Attending: Physician Assistant | Admitting: Physician Assistant

## 2023-11-26 ENCOUNTER — Encounter (HOSPITAL_COMMUNITY): Payer: Self-pay | Admitting: Physician Assistant

## 2023-11-26 VITALS — BP 104/80 | HR 73 | Ht 76.0 in | Wt 286.0 lb

## 2023-11-26 DIAGNOSIS — E0500 Thyrotoxicosis with diffuse goiter without thyrotoxic crisis or storm: Secondary | ICD-10-CM

## 2023-11-26 DIAGNOSIS — I4819 Other persistent atrial fibrillation: Secondary | ICD-10-CM

## 2023-11-26 DIAGNOSIS — I480 Paroxysmal atrial fibrillation: Secondary | ICD-10-CM | POA: Insufficient documentation

## 2023-11-26 DIAGNOSIS — D6869 Other thrombophilia: Secondary | ICD-10-CM | POA: Insufficient documentation

## 2023-11-26 MED ORDER — METHIMAZOLE 5 MG PO TABS: 5.0000 mg | ORAL_TABLET | Freq: Every day | ORAL | 3 refills | Status: DC

## 2023-11-26 MED ORDER — FLECAINIDE ACETATE 50 MG PO TABS: 50.0000 mg | ORAL_TABLET | Freq: Two times a day (BID) | ORAL | Status: AC

## 2023-11-26 NOTE — Telephone Encounter (Signed)
 Pharmacy Patient Advocate Encounter  Received notification from [facility] FEP that Prior Authorization for  Zepbound  2.5MG /0.5ML pen-injectors has been DENIED.  Full denial letter will be uploaded to the media tab. See denial reason below.   PA #/Case ID/Reference #: [id number]

## 2023-11-26 NOTE — Patient Instructions (Addendum)
 11/28 -- restart Flecainide  50mg  twice a day    Cardioversion scheduled for: Monday, December 1st   - Arrive at the [facility] ([address]  and check in with ADMITTING at 10:00 AM   - Do not eat or drink anything after midnight the night prior to your procedure.   - Take all your morning medication (except diabetic medications) with a sip of water prior to arrival.  - Do NOT miss any doses of your blood thinner - if you should miss a dose or take a dose more than 4 hours late -- please notify our office immediately.  - You will not be able to drive home after your procedure. Please ensure you have a responsible adult to drive you home. You will need someone with you for 24 hours post procedure.     - Expect to be in the procedural area approximately 2 hours.   - If you feel as if you go back into normal rhythm prior to scheduled cardioversion, please notify our office immediately.   If your procedure is canceled in the cardioversion suite you will be charged a cancellation fee.   Follow up with Dr Nancey - office will contact

## 2023-11-26 NOTE — Progress Notes (Signed)
 Outpatient Endocrinology Note Davie Claud, MD   Patient's Name: Brandon [name].    DOB: [birth year]    MRN: [id number]  REASON OF VISIT: Follow-up for hyperthyroidism  REFERRING PROVIDER: Wendolyn Jenkins Jansky, MD  PCP: Wendolyn Jenkins Jansky, MD  HISTORY OF PRESENT ILLNESS:   Brandon Lakins. is a 73 y.o. old male with past medical history as listed below is presented for follow-up for hyperthyroidism / Graves' disease.   Pertinent Thyroid  History: Patient was referred to endocrinology for evaluation and management of hyperthyroidism.  Initial consult on August 13, 2023.  Patient has history of paroxysmal atrial fibrillation for several years, 5-7 years.  He had routine TSH checked on July 07, 2023 was low 0.02, repeat thyroid  function test on August.  2025 was suppressed TSH with elevated free T3 5.4 And elevated free T4 of 1.82. On August 12, 2023, he had lab with elevated thyrotropin receptor antibody 2.32, consistent with having Graves' disease causing hyperthyroidism.    At the initial visit, patient complains of mild diarrhea.  He occasionally gets palpitation and heat intolerance.  He has lost mild weight however he is also intentionally trying to lose weight.  Patient reports he has atrial fibrillation for 5 to 7 years status post ablation unsuccessful for several times.  He had normal thyroid  function test in the past as of April 2025 at that time TSH was 1.35.  He denies family history of thyroid  disorder.  He used to take amiodarone  for atrial fibrillation has been off of it since beginning of 2024.  No recent IV iodine  contrast use.  No recent illness.  He denies neck pain or any neck compressive symptoms.  No redness or watering of the eyes.  Labs:  Latest Reference Range & Units 08/12/23 09:11  Thyrotropin Receptor Ab 0.00 - 1.75 IU/L 2.32 (H)  (H): Data is abnormally high  Patient was started on methimazole  10 mg daily in August 2025.  Interval history  Patient has been taking  methimazole  10 mg daily.  He denies any new symptoms.  No change in bowel habit or constipation.  He feels tired also has joint pain but not new symptoms.  He has been following with cardiology and there is a plan for cardioversion for atrial fibrillation beginning of next month.  No recent illness or fever.  No stomach issues.  Recent thyroid  function test with elevated TSH and low free T4 and low normal free T3 as follows.    Latest Reference Range & Units 09/27/23 09:22 11/23/23 09:02  TSH 0.40 - 4.50 mIU/L 0.47 9.27 (H)  Triiodothyronine,Free,Serum 2.3 - 4.2 pg/mL 3.0 2.5  T4,Free(Direct) 0.8 - 1.8 ng/dL 0.9 0.6 (L)  (H): Data is abnormally high (L): Data is abnormally low  REVIEW OF SYSTEMS:  As per history of present illness.   PAST MEDICAL HISTORY: Past Medical History:  Diagnosis Date   Allergy 01/05/1664   Anxiety 05/12/1996   Arthritis 07/30/1993   Benign neoplasm of colon    Benign prostatic hyperplasia with urinary obstruction    Bilateral inguinal hernia 05/29/2011   Bilateral pulmonary embolism (HCC) 03/2012   admitted to [facility],  treated with Xarelto    Breast lump 03/29/2019   Calculus of kidney 10/21/2016   Cataract    Chronic bilateral low back pain with sciatica 03/21/2012   Chronic pain    Finger mass, right 10/17/2020   Graves disease    History of alcohol abuse 03/07/2016   History of colon  polyps 11/18/2015   History of kidney stones    History of pulmonary embolism 03/19/2012   Hyperlipemia    Hypertension    Insomnia    Insomnia due to medical condition 12/23/2016   Polyuria secondary to BPH   Major depressive disorder, recurrent episode    Mitral regurgitation 04/24/2013   Mild by TEE   Morbid obesity (HCC) 03/07/2016   Myocardial infarction (HCC) 02/21/2003   Nonrheumatic mitral valve insufficiency 04/25/2013   Mild by TEE  Overview:  Overview:  Mild by TEE Overview:  Overview:  Overview:  Mild by TEE Overview:  Overview:  Mild by TEE    Obesity    OSA (obstructive sleep apnea)    noncompliant with CPAP.  07/27/13- awaiting a CPAP- unable to tolerate mask   Osteoporosis    Persistent atrial fibrillation (HCC)    Recurrent unilateral inguinal hernia 07/17/2013   Restless leg syndrome    takes gabapentin    S/P right total knee arthroplasty 05/24/2018   Sciatica    Sleep apnea    Spinal stenosis, lumbar region with neurogenic claudication    SVT (supraventricular tachycardia) 03/07/2016   Thrombus of left atrial appendage 03/09/2013   Uncomplicated asthma 06/20/2008   Overview:  Overview:  Overview:  Overview:  Qualifier: Diagnosis of  By: Krystal MD, Reyes LABOR Overview:  Overview:  Qualifier: Diagnosis of  By: Krystal MD, Reyes LABOR   Ventral hernia, unspecified, without mention of obstruction or gangrene    right abdominal wall    PAST SURGICAL HISTORY: Past Surgical History:  Procedure Laterality Date   ATRIAL FIBRILLATION ABLATION N/A 02/27/2022   Procedure: ATRIAL FIBRILLATION ABLATION;  Surgeon: Nancey Eulas BRAVO, MD;  Location: [facility] INVASIVE CV LAB;  Service: Cardiovascular;  Laterality: N/A;   CARDIOVERSION N/A 03/21/2012   Procedure: CARDIOVERSION;  Surgeon: Salena GORMAN Negri, MD;  Location: [facility] ENDOSCOPY;  Service: Cardiovascular;  Laterality: N/A;   CARDIOVERSION N/A 04/24/2013   Procedure: CARDIOVERSION;  Surgeon: Leim VEAR Moose, MD;  Location: [facility] ENDOSCOPY;  Service: Cardiovascular;  Laterality: N/A;   CARDIOVERSION N/A 06/27/2015   Procedure: CARDIOVERSION;  Surgeon: Ezra GORMAN Shuck, MD;  Location: [facility] ENDOSCOPY;  Service: Cardiovascular;  Laterality: N/A;   CARDIOVERSION N/A 11/11/2021   Procedure: CARDIOVERSION;  Surgeon: Pietro Redell GORMAN, MD;  Location: [facility] ENDOSCOPY;  Service: Cardiovascular;  Laterality: N/A;   CARDIOVERSION N/A 05/11/2022   Procedure: CARDIOVERSION;  Surgeon: Santo Stanly LABOR, MD;  Location: [facility] INVASIVE CV LAB;  Service: Cardiovascular;  Laterality: N/A;   CARDIOVERSION N/A 07/13/2022    Procedure: CARDIOVERSION;  Surgeon: Francyne Headland, MD;  Location: [facility] INVASIVE CV LAB;  Service: Cardiovascular;  Laterality: N/A;   CATARACT EXTRACTION     COLONOSCOPY W/ POLYPECTOMY     ELECTROPHYSIOLOGIC STUDY N/A 07/30/2015   Procedure: Atrial Fibrillation Ablation;  Surgeon: Lynwood Rakers, MD;  Location: [facility] INVASIVE CV LAB;  Service: Cardiovascular;  Laterality: N/A;   EXTRACORPOREAL SHOCK WAVE LITHOTRIPSY Right 10/29/2016   Procedure: RIGHT EXTRACORPOREAL SHOCK WAVE LITHOTRIPSY (ESWL);  Surgeon: Alvaro Hummer, MD;  Location: [facility] ORS;  Service: Urology;  Laterality: Right;   EYE SURGERY Right    cataract   FEMUR FRACTURE SURGERY     HERNIA REPAIR  07/06/2011   INGUINAL HERNIA REPAIR Right 07/28/2013   Procedure: RIGHT INGUINAL HERNIA REPAIR;  Surgeon: Donnice POUR. Belinda, MD;  Location: [facility] OR;  Service: General;  Laterality: Right;   INGUINAL HERNIA REPAIR Right 09/22/2016   Procedure: LAPAROSCOPIC RIGHT INGUINAL HERNIA;  Surgeon: Stevie Herlene Righter,  MD;  Location: [facility] ORS;  Service: General;  Laterality: Right;  With MESH   INSERTION OF MESH Right 07/28/2013   Procedure: INSERTION OF MESH;  Surgeon: Donnice POUR. Tsuei, MD;  Location: [facility] OR;  Service: General;  Laterality: Right;   JOINT REPLACEMENT  Both knees 2018   KNEE ARTHROSCOPY     left   REPLACEMENT TOTAL KNEE Left    ROTATOR CUFF REPAIR     right   ROTATOR CUFF REPAIR Left 03/08/2014   DR SUPPLE   SHOULDER ARTHROSCOPY WITH ROTATOR CUFF REPAIR AND SUBACROMIAL DECOMPRESSION Left 03/08/2014   Procedure: LEFT SHOULDER ARTHROSCOPY WITH ROTATOR CUFF REPAIR/SUBACROMIAL DECOMPRESSION/DISTAL CLAVICLE RESECTION;  Surgeon: Franky CHRISTELLA Pointer, MD;  Location: [facility] OR;  Service: Orthopedics;  Laterality: Left;   SVT ABLATION N/A 01/23/2019   Procedure: SVT ABLATION;  Surgeon: Waddell Danelle ORN, MD;  Location: [facility] INVASIVE CV LAB;  Service: Cardiovascular;  Laterality: N/A;   TEE WITHOUT CARDIOVERSION N/A 03/21/2012   Procedure: TRANSESOPHAGEAL  ECHOCARDIOGRAM (TEE);  Surgeon: Salena GORMAN Negri, MD;  Location: [facility] ENDOSCOPY;  Service: Cardiovascular;  Laterality: N/A;   TEE WITHOUT CARDIOVERSION N/A 04/24/2013   Procedure: TRANSESOPHAGEAL ECHOCARDIOGRAM (TEE);  Surgeon: Leim VEAR Moose, MD;  Location: [facility] ENDOSCOPY;  Service: Cardiovascular;  Laterality: N/A;   TEE WITHOUT CARDIOVERSION N/A 07/29/2015   Procedure: TRANSESOPHAGEAL ECHOCARDIOGRAM (TEE);  Surgeon: Vina LULLA Gull, MD;  Location: [facility] ENDOSCOPY;  Service: Cardiovascular;  Laterality: N/A;   TEE WITHOUT CARDIOVERSION N/A 05/11/2022   Procedure: TRANSESOPHAGEAL ECHOCARDIOGRAM;  Surgeon: Santo Stanly LABOR, MD;  Location: [facility] INVASIVE CV LAB;  Service: Cardiovascular;  Laterality: N/A;   TONSILLECTOMY     TOTAL KNEE ARTHROPLASTY Right 05/24/2018   Procedure: RIGHT TOTAL KNEE ARTHROPLASTY;  Surgeon: Ernie Donnice, MD;  Location: [facility] ORS;  Service: Orthopedics;  Laterality: Right;  70 mins    ALLERGIES: Allergies  Allergen Reactions   Ace Inhibitors Cough   Albuterol  Other (See Comments)    Racing heart    FAMILY HISTORY:  Family History  Problem Relation Age of Onset   Cancer Father        bone   Heart failure Mother    Hypertension Mother    Asthma Mother    Cancer Paternal Grandmother        ovarian   CVA Other        Fam Hx of multiple myeloma   Diabetes Other        Fam Hx of DM    SOCIAL HISTORY: Social History   Socioeconomic History   Marital status: Married    Spouse name: Not on file   Number of children: 2   Years of education: college   Highest education level: Bachelor's degree (e.g., BA, AB, BS)  Occupational History   Occupation: RETIRED  Tobacco Use   Smoking status: Former    Current packs/day: 0.00    Average packs/day: 0.5 packs/day for 5.0 years (2.5 ttl pk-yrs)    Types: Cigarettes    Start date: 16    Quit date: 1976    Years since quitting: 49.9   Smokeless tobacco: Never   Tobacco comments:    Former smoker 03/27/22   Vaping Use   Vaping status: Never Used  Substance and Sexual Activity   Alcohol use: No    Comment: Former EtOH abuse, stopped 06/2016   Drug use: Yes    Types: Marijuana    Comment: negative hx for IV drug abuse   Sexual activity: Not on file  Other Topics Concern   Not on file  Social History Narrative   Lives alone in [place]   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   Prior [employer]   Wife lives in town (still married but lives separate)   Has biological daughter in [place] ([place]),  2 steps-1in [place] and 1 in [place])   Social Drivers of Health   Financial Resource Strain: Low Risk  (11/05/2023)   Overall Financial Resource Strain (CARDIA)    Difficulty of Paying Living Expenses: Not hard at all  Food Insecurity: No Food Insecurity (11/05/2023)   Hunger Vital Sign    Worried About Running Out of Food in the Last Year: Never true    Ran Out of Food in the Last Year: Never true  Transportation Needs: No Transportation Needs (11/05/2023)   PRAPARE - Administrator, Civil Service (Medical): No    Lack of Transportation (Non-Medical): No  Physical Activity: Inactive (11/05/2023)   Exercise Vital Sign    Days of Exercise per Week: 0 days    Minutes of Exercise per Session: Not on file  Stress: Stress Concern Present (11/05/2023)   [employer] of Occupational Health - Occupational Stress Questionnaire    Feeling of Stress: To some extent  Social Connections: Socially Isolated (11/05/2023)   Social Connection and Isolation Panel    Frequency of Communication with Friends and Family: Never    Frequency of Social Gatherings with Friends and Family: Never    Attends Religious Services: Never    Database Administrator or Organizations: Yes    Attends Banker Meetings: Never    Marital Status: Separated    MEDICATIONS:  Current Outpatient Medications  Medication Sig Dispense Refill   amLODipine  (NORVASC ) 5 MG tablet TAKE 1 TABLET (5 MG TOTAL) BY MOUTH  DAILY. 30 tablet 0   atorvastatin  (LIPITOR) 40 MG tablet TAKE 1 TABLET BY MOUTH EVERY DAY 90 tablet 1   baclofen  (LIORESAL ) 10 MG tablet TAKE 1/2-1 TABLET BY MOUTH EVERY 8 (EIGHT) HOURS AS NEEDED FOR MUSCLE SPASMS. 180 tablet 1   buPROPion  (WELLBUTRIN  XL) 150 MG 24 hr tablet TAKE 1 TABLET BY MOUTH EVERY DAY 90 tablet 1   carvedilol  (COREG ) 25 MG tablet TAKE 1 TABLET (25 MG TOTAL) BY MOUTH TWICE A DAY WITH MEALS 120 tablet 0   ELIQUIS  5 MG TABS tablet TAKE 1 TABLET BY MOUTH TWICE A DAY 60 tablet 5   eplerenone  (INSPRA ) 25 MG tablet TAKE 1 TABLET (25 MG TOTAL) BY MOUTH DAILY. 90 tablet 1   escitalopram  (LEXAPRO ) 20 MG tablet Take 1 tablet (20 mg total) by mouth daily. 90 tablet 1   flecainide  (TAMBOCOR ) 50 MG tablet Take 1 tablet (50 mg total) by mouth 2 (two) times daily.     fluticasone  (FLONASE ) 50 MCG/ACT nasal spray SPRAY 2 SPRAYS INTO EACH NOSTRIL EVERY DAY 48 mL 1   gabapentin  (NEURONTIN ) 400 MG capsule Take 1 capsule (400 mg total) by mouth daily. Take 400mg  in morning, take 800mg  cap at night 30 capsule 2   gabapentin  (NEURONTIN ) 800 MG tablet Take 1 tablet (800 mg total) by mouth at bedtime. 90 tablet 6   ketoconazole  (NIZORAL ) 2 % cream Apply 1 Application topically 2 (two) times daily. 60 g 0   memantine  (NAMENDA ) 10 MG tablet Take 1 tablet (10 mg total) by mouth 2 (two) times daily. 180 tablet 1   methimazole  (TAPAZOLE ) 5 MG tablet Take 1 tablet (5 mg total) by mouth daily. 90  tablet 3   potassium chloride  SA (KLOR-CON  M) 20 MEQ tablet Take 2 tablets (40 mEq total) by mouth 2 (two) times daily. 360 tablet 1   tamsulosin  (FLOMAX ) 0.4 MG CAPS capsule Take 1 capsule (0.4 mg total) by mouth daily. TAKE 1 CAPSULE BY MOUTH EVERY DAY. Patient to follow up with PCP prior to future refills 90 capsule 3   No current facility-administered medications for this visit.    PHYSICAL EXAM: There were no vitals filed for this visit.  There is no height or weight on file to calculate BMI.  Wt  Readings from Last 3 Encounters:  11/26/23 286 lb (129.7 kg)  11/24/23 289 lb 3.2 oz (131.2 kg)  11/08/23 283 lb 6 oz (128.5 kg)    General: Well developed, well nourished male in no apparent distress.  HEENT: AT/[place], no external lesions. Hearing intact to the spoken word Eyes: EOMI. No stare, proptosis or lid lag. Conjunctiva clear and no icterus. No erythema or watering Neck: Trachea midline, neck supple without appreciable thyromegaly or lymphadenopathy and no palpable thyroid  nodules Lungs: Clear to auscultation, no wheeze. Respirations not labored Heart: S1S2, irregular in rate and rhythm. Abdomen: Soft, non tender, non distended Neurologic: Alert, oriented, normal speech, deep tendon biceps reflexes 1+,  no gross focal neurological deficit Extremities: No pedal pitting edema, no tremors of outstretched hands Skin: Warm, color good.  Psychiatric: Does not appear depressed or anxious  PERTINENT HISTORIC LABORATORY AND IMAGING STUDIES:  All pertinent laboratory results were reviewed. Please see HPI also for further details.   TSH  Date Value Ref Range Status  11/23/2023 9.27 (H) 0.40 - 4.50 mIU/L Final  09/27/2023 0.47 0.40 - 4.50 mIU/L Final  08/06/2023 <0.01 (L) 0.35 - 5.50 uIU/mL Final    Lab Results  Component Value Date   FREET4 0.6 (L) 11/23/2023   FREET4 0.9 09/27/2023   FREET4 1.82 (H) 08/06/2023   T3FREE 2.5 11/23/2023   T3FREE 3.0 09/27/2023   T3FREE 5.4 (H) 08/06/2023   TSH 9.27 (H) 11/23/2023   TSH 0.47 09/27/2023   TSH <0.01 (L) 08/06/2023    Lab Results  Component Value Date   THYROTRECAB 2.32 (H) 08/12/2023    Lab Results  Component Value Date   TSH 9.27 (H) 11/23/2023   TSH 0.47 09/27/2023   TSH <0.01 (L) 08/06/2023   FREET4 0.6 (L) 11/23/2023   FREET4 0.9 09/27/2023   FREET4 1.82 (H) 08/06/2023     No results found for: TSI   No components found for: TRAB    ASSESSMENT / PLAN  1. Graves disease    The hyperthyroidism is most likely  due to Graves disease. Patient has thyrotropin receptor antibody positive.   Patient has currently iatrogenic hypothyroid on methimazole .  He is currently taking methimazole  10 mg daily.   The three options of therapy for hyperthyroidism were discussed with the patient, including thionamide drug therapy, thyroidectomy, and radioactive iodine  ablation. I reviewed the possible complications of rash, agranulocytosis, or liver dysfunction associated with anti-thyroid  therapies. I reviewed the risks of hemorrhage, hypocalcemia, hoarseness and hypothyroidism after thyroidectomy. Regarding radioactive iodine  ablation, I informed the patient that most patients treated with radioactive iodine  can be cured of hyperthyroidism with a single dose, but to about 15-20% may require an additional dose. I emphasized that most patients treated with radioactive iodine  develop permanent hypothyroidism, requiring lifelong replacement with thyroid  hormone. I discussed the rare occurrence of transient increase in thyroid  hormone levels after radioactive iodine  and associated with  symptoms, possible worsening of Grave's eye disease, and the likely small but not insignificant risks associated with radiation exposure of this kind.   Will treat with antithyroid medication at this time.Okay to be on antithyroid medication at this time to see if spontaneous resolution occurs.   Discussed that especially with having atrial fibrillation it is important to keep thyroid  hormone levels in the normal range.  Overactive thyroid  can aggravate atrial fibrillation.  Plan: - Decrease meds muscle from 10 to 5 mg daily. -He needs close endocrinology follow-up. - Follow-up in 6-7 weeks with labs prior to follow-up visit.   Diagnoses and all orders for this visit:  Graves disease -     methimazole  (TAPAZOLE ) 5 MG tablet; Take 1 tablet (5 mg total) by mouth daily. -     T4, free -     T3, free -     TSH      DISPOSITION Follow up in  clinic in 6 to 7 weeks suggested.  Labs prior to follow-up visit as ordered.  All questions answered and patient verbalized understanding of the plan.  Mollye Guinta, MD [facility] Endocrinology [facility] [address] [place], [address] [place], [place] [address] Phone # [phone]   At least part of this note was generated using voice recognition software. Inadvertent word errors may have occurred, which were not recognized during the proofreading process.

## 2023-12-13 NOTE — Progress Notes (Signed)
 Patient Called with the Following Pre-Procedure instructions:   Time of Arrival for Procedure: 0645  NPO at midnight prior to procedure day besides sips of water with morning medications  Patient must have a ride home and someone to stay with them for 24 hours post procedure  Instructed to take following medications morning prior to procedure: Eliquis 

## 2023-12-14 ENCOUNTER — Encounter (HOSPITAL_COMMUNITY): Admission: RE | Disposition: A | Payer: Self-pay | Source: Home / Self Care | Attending: Cardiology

## 2023-12-14 ENCOUNTER — Ambulatory Visit (HOSPITAL_COMMUNITY): Admitting: Certified Registered"

## 2023-12-14 ENCOUNTER — Ambulatory Visit (HOSPITAL_COMMUNITY)
Admission: RE | Admit: 2023-12-14 | Discharge: 2023-12-14 | Disposition: A | Attending: Cardiology | Admitting: Cardiology

## 2023-12-14 ENCOUNTER — Other Ambulatory Visit: Payer: Self-pay

## 2023-12-14 DIAGNOSIS — Z87891 Personal history of nicotine dependence: Secondary | ICD-10-CM | POA: Diagnosis not present

## 2023-12-14 DIAGNOSIS — I4719 Other supraventricular tachycardia: Secondary | ICD-10-CM | POA: Diagnosis not present

## 2023-12-14 DIAGNOSIS — I480 Paroxysmal atrial fibrillation: Secondary | ICD-10-CM

## 2023-12-14 DIAGNOSIS — Z79899 Other long term (current) drug therapy: Secondary | ICD-10-CM | POA: Diagnosis not present

## 2023-12-14 DIAGNOSIS — I4819 Other persistent atrial fibrillation: Secondary | ICD-10-CM | POA: Diagnosis not present

## 2023-12-14 DIAGNOSIS — Z7901 Long term (current) use of anticoagulants: Secondary | ICD-10-CM | POA: Diagnosis not present

## 2023-12-14 DIAGNOSIS — D6869 Other thrombophilia: Secondary | ICD-10-CM | POA: Diagnosis not present

## 2023-12-14 DIAGNOSIS — F418 Other specified anxiety disorders: Secondary | ICD-10-CM | POA: Diagnosis not present

## 2023-12-14 DIAGNOSIS — G4733 Obstructive sleep apnea (adult) (pediatric): Secondary | ICD-10-CM | POA: Diagnosis not present

## 2023-12-14 DIAGNOSIS — I10 Essential (primary) hypertension: Secondary | ICD-10-CM | POA: Diagnosis not present

## 2023-12-14 DIAGNOSIS — I252 Old myocardial infarction: Secondary | ICD-10-CM | POA: Diagnosis not present

## 2023-12-14 DIAGNOSIS — J45909 Unspecified asthma, uncomplicated: Secondary | ICD-10-CM | POA: Diagnosis not present

## 2023-12-14 DIAGNOSIS — I4891 Unspecified atrial fibrillation: Secondary | ICD-10-CM | POA: Diagnosis not present

## 2023-12-14 HISTORY — PX: CARDIOVERSION: EP1203

## 2023-12-14 LAB — POCT I-STAT, CHEM 8
BUN: 13 mg/dL (ref 8–23)
Calcium, Ion: 1.16 mmol/L (ref 1.15–1.40)
Chloride: 100 mmol/L (ref 98–111)
Creatinine, Ser: 1 mg/dL (ref 0.61–1.24)
Glucose, Bld: 106 mg/dL — ABNORMAL HIGH (ref 70–99)
HCT: 43 % (ref 39.0–52.0)
Hemoglobin: 14.6 g/dL (ref 13.0–17.0)
Potassium: 3.2 mmol/L — ABNORMAL LOW (ref 3.5–5.1)
Sodium: 143 mmol/L (ref 135–145)
TCO2: 27 mmol/L (ref 22–32)

## 2023-12-14 SURGERY — CARDIOVERSION (CATH LAB)
Anesthesia: General

## 2023-12-14 MED ORDER — SODIUM CHLORIDE 0.9 % IV SOLN: INTRAVENOUS | Status: DC

## 2023-12-14 MED ORDER — PROPOFOL 10 MG/ML IV BOLUS
INTRAVENOUS | Status: DC | PRN
  Administered 2023-12-14: 20 mg via INTRAVENOUS
  Administered 2023-12-14: 60 mg via INTRAVENOUS

## 2023-12-14 MED ORDER — SODIUM CHLORIDE 0.9% FLUSH
INTRAVENOUS | Status: DC | PRN
  Administered 2023-12-14: 3 mL via INTRAVENOUS
  Administered 2023-12-14: 7 mL via INTRAVENOUS

## 2023-12-14 SURGICAL SUPPLY — 1 items: PAD DEFIB RADIO PHYSIO CONN (PAD) ×1 IMPLANT

## 2023-12-14 NOTE — CV Procedure (Signed)
    Electrical Cardioversion Procedure Note Brandon [name] [id number] 01/21/1950  Procedure: Electrical Cardioversion Indications:  Atrial Fibrillation  Time Out: Verified patient identification, verified procedure,medications/allergies/relevent history reviewed, required imaging and test results available.  Performed  Procedure Details  The patient was NPO after midnight. Anesthesia was administered at the beside  by Dr.Moser with propofol .  Cardioversion was performed with synchronized biphasic defibrillation via AP pads with 150, 300 joules.  2 attempt(s) were performed.  The patient converted to normal sinus rhythm. The patient tolerated the procedure well   IMPRESSION:  Successful cardioversion of atrial fibrillation on Flecainide     Brandon [name] 12/14/2023, 7:48 AM

## 2023-12-14 NOTE — Interval H&P Note (Signed)
 History and Physical Interval Note:  12/14/2023 7:27 AM  Brandon B Kassing Jr.  has presented today for surgery, with the diagnosis of AFIB.  The various methods of treatment have been discussed with the patient and family. After consideration of risks, benefits and other options for treatment, the patient has consented to  Procedure(s): CARDIOVERSION (N/A) as a surgical intervention.  The patient's history has been reviewed, patient examined, no change in status, stable for surgery.  I have reviewed the patient's chart and labs.  Questions were answered to the patient's satisfaction.     [employer]

## 2023-12-14 NOTE — Anesthesia Postprocedure Evaluation (Signed)
 Anesthesia Post Note  Patient: Brandon [name].  Procedure(s) Performed: CARDIOVERSION     Patient location during evaluation: Cath Lab Anesthesia Type: General Level of consciousness: awake and alert Pain management: pain level controlled Vital Signs Assessment: post-procedure vital signs reviewed and stable Respiratory status: spontaneous breathing, nonlabored ventilation and respiratory function stable Cardiovascular status: blood pressure returned to baseline and stable Postop Assessment: no apparent nausea or vomiting Anesthetic complications: no   No notable events documented.  Last Vitals:  Vitals:   12/14/23 0810 12/14/23 0815  BP: (!) 136/94   Pulse: 76 89  Resp: 12 14  Temp:    SpO2: 94% 96%    Last Pain:  Vitals:   12/14/23 0810  TempSrc:   PainSc: 0-No pain                 Abeer Iversen

## 2023-12-14 NOTE — Transfer of Care (Signed)
 Immediate Anesthesia Transfer of Care Note  Patient: Brandon B Pauling Jr.  Procedure(s) Performed: CARDIOVERSION  Patient Location: Cath Lab  Anesthesia Type:General  Level of Consciousness: drowsy and patient cooperative  Airway & Oxygen Therapy: Patient Spontanous Breathing and Patient connected to nasal cannula oxygen  Post-op Assessment: Report given to RN and Post -op Vital signs reviewed and stable  Post vital signs: Reviewed and stable  Last Vitals:  Vitals Value Taken Time  BP 124/94 12/14/23 07:30  Temp    Pulse 72 12/14/23 07:30  Resp 25 12/14/23 07:30  SpO2 93 % 12/14/23 07:30  Vitals shown include unfiled device data.  Last Pain:  Vitals:   12/14/23 0732  PainSc: 0-No pain         Complications: No notable events documented.

## 2023-12-14 NOTE — Anesthesia Preprocedure Evaluation (Signed)
 Anesthesia Evaluation  Patient identified by MRN, date of birth, ID band Patient awake    Reviewed: Allergy & Precautions, NPO status , Patient's Chart, lab work & pertinent test results  History of Anesthesia Complications Negative for: history of anesthetic complications  Airway Mallampati: III  TM Distance: >3 FB Neck ROM: Full    Dental  (+) Dental Advisory Given, Teeth Intact   Pulmonary neg shortness of breath, asthma , sleep apnea , neg COPD, neg recent URI, former smoker, PE   breath sounds clear to auscultation       Cardiovascular hypertension, Pt. on medications (-) angina + Past MI  + dysrhythmias Atrial Fibrillation and Supra Ventricular Tachycardia  Rhythm:Irregular  1. Left ventricular ejection fraction, by estimation, is 50 to 55%. The  left ventricle has low normal function. The left ventricle has no regional  wall motion abnormalities. There is moderate left ventricular hypertrophy.  Left ventricular diastolic  function could not be evaluated.   2. Right ventricular systolic function is normal. The right ventricular  size is normal. There is normal pulmonary artery systolic pressure. The  estimated right ventricular systolic pressure is 15.3 mmHg.   3. Left atrial size was severely dilated.   4. The mitral valve is normal in structure. Mild mitral valve  regurgitation. No evidence of mitral stenosis.   5. The aortic valve was not well visualized. Aortic valve regurgitation  is mild. No aortic stenosis is present.   6. The inferior vena cava is dilated in size with >50% respiratory  variability, suggesting right atrial pressure of 8 mmHg.   7. Ascending aorta measurements are within normal limits for age when  indexed to body surface area.     Neuro/Psych neg Seizures PSYCHIATRIC DISORDERS Anxiety Depression     Neuromuscular disease    GI/Hepatic negative GI ROS, Neg liver ROS,,,  Endo/Other  H/o graves   Renal/GU Lab Results      Component                Value               Date                      NA                       143                 12/14/2023                K                        3.2 (L)             12/14/2023                CO2                      34 (H)              11/08/2023                GLUCOSE                  106 (H)             12/14/2023                BUN  13                  12/14/2023                CREATININE               1.00                12/14/2023                CALCIUM                   9.3                 11/08/2023                GFR                      62.31               11/08/2023                EGFR                     88                  05/14/2023                GFRNONAA                 >60                 04/16/2023                Musculoskeletal  (+) Arthritis ,    Abdominal   Peds  Hematology  (+) Blood dyscrasia Lab Results      Component                Value               Date                      WBC                      6.6                 11/08/2023                HGB                      14.6                12/14/2023                HCT                      43.0                12/14/2023                MCV                      87.5                11/08/2023                PLT  168.0               11/08/2023             eliquis    Anesthesia Other Findings   Reproductive/Obstetrics                              Anesthesia Physical Anesthesia Plan  ASA: 3  Anesthesia Plan: General   Post-op Pain Management: Minimal or no pain anticipated   Induction: Intravenous  PONV Risk Score and Plan: 2 and Treatment may vary due to age or medical condition  Airway Management Planned: Mask, Nasal Cannula, Natural Airway and Simple Face Mask  Additional Equipment: None  Intra-op Plan:   Post-operative Plan:   Informed Consent: I have reviewed the patients  History and Physical, chart, labs and discussed the procedure including the risks, benefits and alternatives for the proposed anesthesia with the patient or authorized representative who has indicated his/her understanding and acceptance.     Dental advisory given  Plan Discussed with: CRNA  Anesthesia Plan Comments:          Anesthesia Quick Evaluation

## 2023-12-14 NOTE — Progress Notes (Signed)
 Dr. Jeffrie talking w/patient at bedside.

## 2023-12-15 ENCOUNTER — Encounter (HOSPITAL_COMMUNITY): Payer: Self-pay | Admitting: Cardiology

## 2023-12-26 ENCOUNTER — Other Ambulatory Visit: Payer: Self-pay | Admitting: Cardiovascular Disease

## 2024-01-06 ENCOUNTER — Other Ambulatory Visit: Payer: Self-pay | Admitting: Physical Medicine & Rehabilitation

## 2024-01-11 ENCOUNTER — Encounter: Payer: Self-pay | Admitting: Physical Medicine & Rehabilitation

## 2024-01-11 ENCOUNTER — Encounter: Attending: Physical Medicine & Rehabilitation | Admitting: Physical Medicine & Rehabilitation

## 2024-01-11 VITALS — BP 124/83 | HR 63 | Ht 76.0 in | Wt 289.0 lb

## 2024-01-11 DIAGNOSIS — G894 Chronic pain syndrome: Secondary | ICD-10-CM | POA: Insufficient documentation

## 2024-01-11 DIAGNOSIS — F1220 Cannabis dependence, uncomplicated: Secondary | ICD-10-CM | POA: Insufficient documentation

## 2024-01-11 DIAGNOSIS — M48062 Spinal stenosis, lumbar region with neurogenic claudication: Secondary | ICD-10-CM | POA: Diagnosis not present

## 2024-01-11 MED ORDER — SUZETRIGINE 50 MG PO TABS: 50.0000 mg | ORAL_TABLET | Freq: Two times a day (BID) | ORAL | 0 refills | Status: AC | PRN

## 2024-01-11 NOTE — Progress Notes (Signed)
 "  Subjective:    Patient ID: Brandon KATHEE Harvel [name]., male    DOB: August 09, 1950, 74 y.o.   MRN: [id number]  HPI     Brandon Silliman. is a 74 y.o. year old male  who  has a past medical history of Anxiety, Arthritis, Benign neoplasm of colon, Benign prostatic hyperplasia with urinary obstruction, Bilateral inguinal hernia (05/29/2011), Bilateral pulmonary embolism (HCC) (3/14), Breast lump (03/29/2019), Calculus of kidney (10/21/2016), Cataract, Chronic bilateral low back pain with sciatica (03/21/2012), Chronic pain, Finger mass, right (10/17/2020), History of alcohol abuse (03/07/2016), History of colon polyps (11/18/2015), History of kidney stones, History of pulmonary embolism (03/19/2012), Hyperlipemia, Hypertension, Insomnia, Insomnia due to medical condition (12/23/2016), Major depressive disorder, recurrent episode (HCC), Mitral regurgitation (04/24/2013), Morbid obesity (HCC) (03/07/2016), Nonrheumatic mitral valve insufficiency (04/25/2013), Obesity, OSA (obstructive sleep apnea), Osteoporosis, Persistent atrial fibrillation (HCC), Recurrent unilateral inguinal hernia (07/17/2013), Restless leg syndrome, S/P right total knee arthroplasty (05/24/2018), Sciatica, Spinal stenosis, lumbar region with neurogenic claudication, SVT (supraventricular tachycardia) (HCC) (03/07/2016), Thrombus of left atrial appendage (03/09/2013), Uncomplicated asthma (06/20/2008), and Ventral hernia, unspecified, without mention of obstruction or gangrene.   They are presenting to PM&R clinic as a new patient for pain management evaluation. They were referred by Dr. Joshua for treatment of lumbar spinal stenosis pain.  Brandon [name] reports progressive worsening pain in his lower back that radiates to his outer and inner thighs.  He says this pain has been worsening gradually over many years.  Pain is worsened with standing up and walking.  Pain has a squeezing quality and he says the pain worsens with each step when he is walking.  Pain is improved with  bending forward or sitting down.  He reports having a history of scoliosis.  He was previously followed by [facility].  Patient was also seen for several years by Dr. Eldonna.  He had a trial of spinal cord stimulator but appears it was not beneficial enough to where he wanted to have it implanted.    Patient reports multiple spinal injections by Dr. Eldonna and he thinks that injections that he had reduced his pain from several weeks.  He is not sure which injections ESI versus facet joint ablation provided him with the best relief.   Chart review indicates he had radiofrequency ablation of left L3-4, L4-5 and L5-S1 facet joints by Dr. Eldonna after having a double diagnostic medial branch block with more than 50% relief.  It does not appear he had a lot of relief from the RFA.   More recently he was seen at [facility] neurosurgery.  Patient reports neurosurgery suggested he do injections again and he wanted to do them in [place] versus doing them at [facility] due to distance.  Chart review indicates neurosurgery feels that possibly upper lumbar facet joints could be contributing to his pain and they suggested upper lumbar MBB and possible RFA.   Patient reports having leg length discrepancy and has a lift in his left shoe.   Patient reports he tries to stay active and tries to exercise in a pool when he can.  He used to like to swim when he was younger.   He uses a cane for balance but does not want to use a walker.   Red flag symptoms: No red flags for back pain endorsed in Hx or ROS   Medications tried: Topical medications denies , tried CBD ointment in the past  Nsaids - limited by Afib  Tylenol   - Helps a  little Opiates  Hydrocodone  and oxycodone - help a little, helps him.  He would like to use this only intermittently when his pain is very severe during acute exacerbations Gabapentin  /- Takes TID 800mg   TCAs  -denies  SNRIs  -does not recall   Other treatments: PT- helps while he is getting  it but results havent lasted  TENs unit -denies benefit Injections -ESI, RFA, spinal cord stimulator trial in past by Dr. Eldonna  Surgery denies prior spine surgery   Goals for pain management: To be more active and have decreased pain     Pain is going down his left leg to his knee    Prior UDS results:  Labs (Brief)          Component Value Date/Time    LABOPIA NEGATIVE 03/17/2012 0133    COCAINSCRNUR NEGATIVE 03/17/2012 0133    LABBENZ POSITIVE (A) 03/17/2012 0133    AMPHETMU NEGATIVE 03/17/2012 0133      Interval history 11/12/2022 Brandon [name] is here for follow-up regarding his chronic low back pain.  He had bilateral T12, L1, L2 medial branch blocks completed on 10/27/2022 by Dr. Carilyn.  Patient reports he did not have significant benefit after his procedure.  Patient reports he tried to go to aquatic therapy, however patient had incident with staff and left without being seen.  Pain overall unchanged since last visit.  He has not tried Lyrica  yet, has not picked up from the pharmacy.   Interval history 12/15/22 Patient is here for follow-up regarding his chronic back pain.  He continues to have significant back pain although he feels like it is improved since starting Lyrica .  Reports he is tolerating this medication well.  He continues to have pain during activities.  He is open to retrying aquatic therapy.  His activity continues to be limited, has a hard time walking into stores from the parking lot when he needs to go shopping or pick up something from the house.. Reports occasional dizziness when for standing up, this improves after a few steps.   Interval history 02/15/23 Brandon [name] is here for follow-up regarding his chronic back pain.  He reports that therapy is helping a lot and he feels like his function is much improved.  He is very happy with his treatment at [facility].  He continues to work with therapy. Patient reports his PCP has changed his medication back to  gabapentin  from Lyrica .  He feels like there was not much difference in his pain control and the gabapentin  was helping his RLS better.    Interval history 05/17/23 Brandon [name] is here for follow-up regarding his chronic back pain.  Reports aquatic therapy is helpful and he enjoyed working at [employer].  Back pain overall is doing better.  He has not yet started exercising in the pool on his own but would like to do so.  Patient recently moved to a new home and has been more busy due to this.  He also has treatment for A-fib scheduled.  Reports baclofen  in the past was beneficial for his back pain and did not cause any significant side effects.  Interval history 08/17/23 Brandon [name] reports for the past few weeks he has been having more limitations and difficulty due to chronic pain.   He continues to have pain in his lower back but largely his pain is in his thighs bilaterally.  Pain is worsened with standing and ambulation.  It limits the distance he can After standing and walking  for a while his thighs will become tingly and feel numb.  Reports he is able to exercise better when he is on the exercise bike.  Pain is improved with sitting down or bending over.  Patient has been taking gabapentin  800 mg at night that has been beneficial to his pain, he does not take it during the day currently.  He reports he took it in the past during the day without any significant side effects.  Patient reports he was recently diagnosed with Graves' disease.   Interval history 10/11/23 Reports significant worsening of low back pain over the past week, describing it as terrible with resulting in more difficulty walking. Pain is located across the entire lower back. States he is not emptying his bladder well. He has a urologist but has not seen him recently. Continues to use legally purchased THC/hemp products for pain. Notes his appointment with Dr. Clorinda for injections has been rescheduled to 10/28/2023 for ESI.  -  Gabapentin : Taking 800 mg at night only. Was previously confused about the dosing schedule and ran out.  - Tamsulosin : Taking once daily for urinary symptoms. Notes the prescription requires a urology follow-up for renewal. - Lexapro  - Wellbutrin  - Recently diagnosed with Graves' disease and started on new medications by another provider.  Interval history 01/11/23 The patient presents for follow-up regarding chronic back pain and leg pain. He reports persistent, daily back pain with no relief. He describes the pain as shooting down his left leg, causing an ache in the thigh area. He also notes some right-sided hip pain, which he attributes to an irritated nerve. He reports that his mental attitude has improved since his primary care physician adjusted his anxiety medication, which he feels helps with the pain. He is considering getting a walker due to the severity of the pain.  He is contemplating surgical options but is hesitant due to the long recovery time mentioned by a previous surgeon. Seen by Dr. Claudene NSGY who recommended he see a partner Dr. Clois for a second opinion and ordered scoliosis xrays and flexion/extension X-rays of the spine. The patient has not yet had these X-rays done.   He continues to use [facility] products for pain management and is hesitant to stop, citing concerns about the efficacy of alternative medications and the number of pills he is already taking. He reports that [facility] does not cause him problems.  Pain Inventory Average Pain 9 Pain Right Now 9 My pain is aching when walking or standing sharp burning dull stabbing and tingling In the last 24 hours, has pain interfered with the following? General activity 9 Relation with others 9 Enjoyment of life 9 What TIME of day is your pain at its worst? all Sleep (in general) Poor  Pain is worse with: standing, walking Pain improves with: rest and medication Relief from Meds: 0  Family History  Problem Relation Age of  Onset   Cancer Father        bone   Heart failure Mother    Hypertension Mother    Asthma Mother    Cancer Paternal Grandmother        ovarian   CVA Other        Fam Hx of multiple myeloma   Diabetes Other        Fam Hx of DM   Social History   Socioeconomic History   Marital status: Married    Spouse name: Not on file   Number of children: 2  Years of education: college   Highest education level: Bachelor's degree (e.g., BA, AB, BS)  Occupational History   Occupation: RETIRED  Tobacco Use   Smoking status: Former    Current packs/day: 0.00    Average packs/day: 0.5 packs/day for 5.0 years (2.5 ttl pk-yrs)    Types: Cigarettes    Start date: 70    Quit date: 1976    Years since quitting: 50.0   Smokeless tobacco: Never   Tobacco comments:    Former smoker 03/27/22  Vaping Use   Vaping status: Never Used  Substance and Sexual Activity   Alcohol use: No    Comment: Former EtOH abuse, stopped 06/2016   Drug use: Yes    Types: Marijuana    Comment: negative hx for IV drug abuse   Sexual activity: Not on file  Other Topics Concern   Not on file  Social History Narrative   Lives alone in [place]   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   Prior [employer]   Wife lives in town (still married but lives separate)   Has biological daughter in [place] ([place] Villamizar),  2 steps-1in [place] and 1 in [place])   Social Drivers of Health   Tobacco Use: Medium Risk (11/26/2023)   Patient History    Smoking Tobacco Use: Former    Smokeless Tobacco Use: Never    Passive Exposure: Not on Actuary Strain: Low Risk (11/05/2023)   Overall Financial Resource Strain (CARDIA)    Difficulty of Paying Living Expenses: Not hard at all  Food Insecurity: No Food Insecurity (11/05/2023)   Epic    Worried About Programme Researcher, Broadcasting/film/video in the Last Year: Never true    Ran Out of Food in the Last Year: Never true  Transportation Needs: No Transportation Needs (11/05/2023)   Epic    Lack of  Transportation (Medical): No    Lack of Transportation (Non-Medical): No  Physical Activity: Inactive (11/05/2023)   Exercise Vital Sign    Days of Exercise per Week: 0 days    Minutes of Exercise per Session: Not on file  Stress: Stress Concern Present (11/05/2023)   [employer] of Occupational Health - Occupational Stress Questionnaire    Feeling of Stress: To some extent  Social Connections: Socially Isolated (11/05/2023)   Social Connection and Isolation Panel    Frequency of Communication with Friends and Family: Never    Frequency of Social Gatherings with Friends and Family: Never    Attends Religious Services: Never    Database Administrator or Organizations: Yes    Attends Banker Meetings: Never    Marital Status: Separated  Depression (PHQ2-9): Medium Risk (10/11/2023)   Depression (PHQ2-9)    PHQ-2 Score: 6  Alcohol Screen: Low Risk (04/02/2023)   Alcohol Screen    Last Alcohol Screening Score (AUDIT): 0  Housing: Low Risk (11/05/2023)   Epic    Unable to Pay for Housing in the Last Year: No    Number of Times Moved in the Last Year: 1    Homeless in the Last Year: No  Utilities: Not At Risk (04/02/2023)   AHC Utilities    Threatened with loss of utilities: No  Health Literacy: Adequate Health Literacy (04/02/2023)   B1300 Health Literacy    Frequency of need for help with medical instructions: Never   Past Surgical History:  Procedure Laterality Date   ATRIAL FIBRILLATION ABLATION N/A 02/27/2022   Procedure: ATRIAL FIBRILLATION ABLATION;  Surgeon: Nancey Scotts  E, MD;  [facility];  Service: Cardiovascular;  Laterality: N/A;   CARDIOVERSION N/A 03/21/2012   Procedure: CARDIOVERSION;  Surgeon: Salena GORMAN Negri, MD;  Location: [facility] ENDOSCOPY;  Service: Cardiovascular;  Laterality: N/A;   CARDIOVERSION N/A 04/24/2013   Procedure: CARDIOVERSION;  Surgeon: Leim VEAR Moose, MD;  Location: [facility] ENDOSCOPY;  Service: Cardiovascular;   Laterality: N/A;   CARDIOVERSION N/A 06/27/2015   Procedure: CARDIOVERSION;  Surgeon: Ezra GORMAN Shuck, MD;  Location: [facility] ENDOSCOPY;  Service: Cardiovascular;  Laterality: N/A;   CARDIOVERSION N/A 11/11/2021   Procedure: CARDIOVERSION;  Surgeon: Pietro Redell GORMAN, MD;  Location: [facility] ENDOSCOPY;  Service: Cardiovascular;  Laterality: N/A;   CARDIOVERSION N/A 05/11/2022   Procedure: CARDIOVERSION;  Surgeon: Santo Stanly LABOR, MD;  [facility];  Service: Cardiovascular;  Laterality: N/A;   CARDIOVERSION N/A 07/13/2022   Procedure: CARDIOVERSION;  Surgeon: Francyne Headland, MD;  [facility];  Service: Cardiovascular;  Laterality: N/A;   CARDIOVERSION N/A 12/14/2023   Procedure: CARDIOVERSION;  Surgeon: Jeffrie Oneil BROCKS, MD;  [facility];  Service: Cardiovascular;  Laterality: N/A;   CATARACT EXTRACTION     COLONOSCOPY W/ POLYPECTOMY     ELECTROPHYSIOLOGIC STUDY N/A 07/30/2015   Procedure: Atrial Fibrillation Ablation;  Surgeon: Lynwood Rakers, MD;  Location: [facility] INVASIVE CV LAB;  Service: Cardiovascular;  Laterality: N/A;   EXTRACORPOREAL SHOCK WAVE LITHOTRIPSY Right 10/29/2016   Procedure: RIGHT EXTRACORPOREAL SHOCK WAVE LITHOTRIPSY (ESWL);  Surgeon: Alvaro Hummer, MD;  Location: [facility] ORS;  Service: Urology;  Laterality: Right;   EYE SURGERY Right    cataract   FEMUR FRACTURE SURGERY     HERNIA REPAIR  07/06/2011   INGUINAL HERNIA REPAIR Right 07/28/2013   Procedure: RIGHT INGUINAL HERNIA REPAIR;  Surgeon: Donnice POUR. Belinda, MD;  Location: [facility] OR;  Service: General;  Laterality: Right;   INGUINAL HERNIA REPAIR Right 09/22/2016   Procedure: LAPAROSCOPIC RIGHT INGUINAL HERNIA;  Surgeon: Stevie Herlene Righter, MD;  Location: [facility] ORS;  Service: General;  Laterality: Right;  With MESH   INSERTION OF MESH Right 07/28/2013   Procedure: INSERTION OF MESH;  Surgeon: Donnice POUR. Tsuei, MD;  Location: [facility];  Service: General;  Laterality: Right;   JOINT  REPLACEMENT  Both knees 2018   KNEE ARTHROSCOPY     left   REPLACEMENT TOTAL KNEE Left    ROTATOR CUFF REPAIR     right   ROTATOR CUFF REPAIR Left 03/08/2014   DR SUPPLE   SHOULDER ARTHROSCOPY WITH ROTATOR CUFF REPAIR AND SUBACROMIAL DECOMPRESSION Left 03/08/2014   Procedure: LEFT SHOULDER ARTHROSCOPY WITH ROTATOR CUFF REPAIR/SUBACROMIAL DECOMPRESSION/DISTAL CLAVICLE RESECTION;  Surgeon: Franky CHRISTELLA Pointer, MD;  [facility];  Service: Orthopedics;  Laterality: Left;   SVT ABLATION N/A 01/23/2019   Procedure: SVT ABLATION;  Surgeon: Waddell Danelle ORN, MD;  Location: [facility] INVASIVE CV LAB;  Service: Cardiovascular;  Laterality: N/A;   TEE WITHOUT CARDIOVERSION N/A 03/21/2012   Procedure: TRANSESOPHAGEAL ECHOCARDIOGRAM (TEE);  Surgeon: Salena GORMAN Negri, MD;  Location: [facility] ENDOSCOPY;  Service: Cardiovascular;  Laterality: N/A;   TEE WITHOUT CARDIOVERSION N/A 04/24/2013   Procedure: TRANSESOPHAGEAL ECHOCARDIOGRAM (TEE);  Surgeon: Leim VEAR Moose, MD;  Location: [facility] ENDOSCOPY;  Service: Cardiovascular;  Laterality: N/A;   TEE WITHOUT CARDIOVERSION N/A 07/29/2015   Procedure: TRANSESOPHAGEAL ECHOCARDIOGRAM (TEE);  Surgeon: Vina LULLA Gull, MD;  Location: [facility] ENDOSCOPY;  Service: Cardiovascular;  Laterality: N/A;   TEE WITHOUT CARDIOVERSION N/A 05/11/2022   Procedure: TRANSESOPHAGEAL ECHOCARDIOGRAM;  Surgeon: Santo Stanly LABOR, MD;  [facility];  Service: Cardiovascular;  Laterality: N/A;   TONSILLECTOMY     TOTAL KNEE ARTHROPLASTY Right 05/24/2018   Procedure: RIGHT TOTAL KNEE ARTHROPLASTY;  Surgeon: Ernie Cough, MD;  Location: [facility];  Service: Orthopedics;  Laterality: Right;  70 mins   Past Surgical History:  Procedure Laterality Date   ATRIAL FIBRILLATION ABLATION N/A 02/27/2022   Procedure: ATRIAL FIBRILLATION ABLATION;  Surgeon: Nancey Eulas BRAVO, MD;  [facility];  Service: Cardiovascular;  Laterality: N/A;   CARDIOVERSION N/A 03/21/2012   Procedure:  CARDIOVERSION;  Surgeon: Salena GORMAN Negri, MD;  [facility];  Service: Cardiovascular;  Laterality: N/A;   CARDIOVERSION N/A 04/24/2013   Procedure: CARDIOVERSION;  Surgeon: Leim VEAR Moose, MD;  Location: [facility] ENDOSCOPY;  Service: Cardiovascular;  Laterality: N/A;   CARDIOVERSION N/A 06/27/2015   Procedure: CARDIOVERSION;  Surgeon: Ezra GORMAN Shuck, MD;  Location: [facility] ENDOSCOPY;  Service: Cardiovascular;  Laterality: N/A;   CARDIOVERSION N/A 11/11/2021   Procedure: CARDIOVERSION;  Surgeon: Pietro Redell GORMAN, MD;  Location: [facility] ENDOSCOPY;  Service: Cardiovascular;  Laterality: N/A;   CARDIOVERSION N/A 05/11/2022   Procedure: CARDIOVERSION;  Surgeon: Santo Stanly LABOR, MD;  [facility];  Service: Cardiovascular;  Laterality: N/A;   CARDIOVERSION N/A 07/13/2022   Procedure: CARDIOVERSION;  Surgeon: Francyne Headland, MD;  [facility];  Service: Cardiovascular;  Laterality: N/A;   CARDIOVERSION N/A 12/14/2023   Procedure: CARDIOVERSION;  Surgeon: Jeffrie Oneil BROCKS, MD;  [facility];  Service: Cardiovascular;  Laterality: N/A;   CATARACT EXTRACTION     COLONOSCOPY W/ POLYPECTOMY     ELECTROPHYSIOLOGIC STUDY N/A 07/30/2015   Procedure: Atrial Fibrillation Ablation;  Surgeon: Lynwood Rakers, MD;  Location: [facility] INVASIVE CV LAB;  Service: Cardiovascular;  Laterality: N/A;   EXTRACORPOREAL SHOCK WAVE LITHOTRIPSY Right 10/29/2016   Procedure: RIGHT EXTRACORPOREAL SHOCK WAVE LITHOTRIPSY (ESWL);  Surgeon: Alvaro Hummer, MD;  Location: [facility];  Service: Urology;  Laterality: Right;   EYE SURGERY Right    cataract   FEMUR FRACTURE SURGERY     HERNIA REPAIR  07/06/2011   INGUINAL HERNIA REPAIR Right 07/28/2013   Procedure: RIGHT INGUINAL HERNIA REPAIR;  Surgeon: Cough POUR. Belinda, MD;  [facility];  Service: General;  Laterality: Right;   INGUINAL HERNIA REPAIR Right 09/22/2016   Procedure: LAPAROSCOPIC RIGHT INGUINAL HERNIA;  Surgeon: Stevie  Herlene Righter, MD;  Location[facility];  Service: General;  Laterality: Right;  With MESH   INSERTION OF MESH Right 07/28/2013   Procedure: INSERTION OF MESH;  Surgeon: Cough POUR. Tsuei, MD;  [facility];  Service: General;  Laterality: Right;   JOINT REPLACEMENT  Both knees 2018   KNEE ARTHROSCOPY     left   REPLACEMENT TOTAL KNEE Left    ROTATOR CUFF REPAIR     right   ROTATOR CUFF REPAIR Left 03/08/2014   DR SUPPLE   SHOULDER ARTHROSCOPY WITH ROTATOR CUFF REPAIR AND SUBACROMIAL DECOMPRESSION Left 03/08/2014   Procedure: LEFT SHOULDER ARTHROSCOPY WITH ROTATOR CUFF REPAIR/SUBACROMIAL DECOMPRESSION/DISTAL CLAVICLE RESECTION;  Surgeon: Franky CHRISTELLA Pointer, MD;  [facility];  Service: Orthopedics;  Laterality: Left;   SVT ABLATION N/A 01/23/2019   Procedure: SVT ABLATION;  Surgeon: Waddell Danelle ORN, MD;  Location: [facility] INVASIVE CV LAB;  Service: Cardiovascular;  Laterality: N/A;   TEE WITHOUT CARDIOVERSION N/A 03/21/2012   Procedure: TRANSESOPHAGEAL ECHOCARDIOGRAM (TEE);  Surgeon: Salena GORMAN Negri, MD;  [facility];  Service: Cardiovascular;  Laterality: N/A;   TEE WITHOUT CARDIOVERSION N/A 04/24/2013   Procedure: TRANSESOPHAGEAL ECHOCARDIOGRAM (TEE);  Surgeon: Leim VEAR Moose, MD;  Location: [facility] ENDOSCOPY;  Service: Cardiovascular;  Laterality: N/A;   TEE WITHOUT CARDIOVERSION N/A 07/29/2015   Procedure: TRANSESOPHAGEAL ECHOCARDIOGRAM (TEE);  Surgeon: Vina LULLA Gull, MD;  Location: [facility] ENDOSCOPY;  Service: Cardiovascular;  Laterality: N/A;   TEE WITHOUT CARDIOVERSION N/A 05/11/2022   Procedure: TRANSESOPHAGEAL ECHOCARDIOGRAM;  Surgeon: Santo Stanly LABOR, MD;  [facility];  Service: Cardiovascular;  Laterality: N/A;   TONSILLECTOMY     TOTAL KNEE ARTHROPLASTY Right 05/24/2018   Procedure: RIGHT TOTAL KNEE ARTHROPLASTY;  Surgeon: Ernie Cough, MD;  Location: [facility];  Service: Orthopedics;  Laterality: Right;  70 mins   Past Medical History:  Diagnosis Date    Allergy 01/05/1664   Anxiety 05/12/1996   Arthritis 07/30/1993   Benign neoplasm of colon    Benign prostatic hyperplasia with urinary obstruction    Bilateral inguinal hernia 05/29/2011   Bilateral pulmonary embolism (HCC) 03/2012   admitted to [facility],  treated with Xarelto    Breast lump 03/29/2019   Calculus of kidney 10/21/2016   Cataract    Chronic bilateral low back pain with sciatica 03/21/2012   Chronic pain    Finger mass, right 10/17/2020   Graves disease    History of alcohol abuse 03/07/2016   History of colon polyps 11/18/2015   History of kidney stones    History of pulmonary embolism 03/19/2012   Hyperlipemia    Hypertension    Insomnia    Insomnia due to medical condition 12/23/2016   Polyuria secondary to BPH   Major depressive disorder, recurrent episode    Mitral regurgitation 04/24/2013   Mild by TEE   Morbid obesity (HCC) 03/07/2016   Myocardial infarction (HCC) 02/21/2003   Nonrheumatic mitral valve insufficiency 04/25/2013   Mild by TEE  Overview:  Overview:  Mild by TEE Overview:  Overview:  Overview:  Mild by TEE Overview:  Overview:  Mild by TEE   Obesity    OSA (obstructive sleep apnea)    noncompliant with CPAP.  07/27/13- awaiting a CPAP- unable to tolerate mask   Osteoporosis    Persistent atrial fibrillation (HCC)    Recurrent unilateral inguinal hernia 07/17/2013   Restless leg syndrome    takes gabapentin    S/P right total knee arthroplasty 05/24/2018   Sciatica    Sleep apnea    Spinal stenosis, lumbar region with neurogenic claudication    SVT (supraventricular tachycardia) 03/07/2016   Thrombus of left atrial appendage 03/09/2013   Uncomplicated asthma 06/20/2008   Overview:  Overview:  Overview:  Overview:  Qualifier: Diagnosis of  By: Krystal MD, Reyes LABOR Overview:  Overview:  Qualifier: Diagnosis of  By: Krystal MD, Reyes LABOR   Ventral hernia, unspecified, without mention of obstruction or gangrene    right abdominal wall   BP  124/83   Pulse 63   Ht 6' 4 (1.93 m)   Wt 289 lb (131.1 kg)   SpO2 93%   BMI 35.18 kg/m   Opioid Risk Score:   Fall Risk Score:  `1  Depression screen [facility] 2/9     01/11/2024    9:28 AM 10/11/2023    9:43 AM 08/17/2023    9:24 AM 08/06/2023   11:02 AM 05/17/2023    9:14 AM 05/04/2023    9:44 AM 04/02/2023    9:57 AM  Depression screen  PHQ 2/9  Decreased Interest 3 3 3 3 3 3 3   Down, Depressed, Hopeless 3 3 3 2 3 3 3   PHQ - 2 Score 6 6 6 5 6 6 6   Altered sleeping   0 3  3 1   Tired, decreased energy   3 2  3  0  Change in appetite   0 2  2 0  Feeling bad or failure about yourself    0 3  2 0  Trouble concentrating   0 2  2 0  Moving slowly or fidgety/restless   0 0  1 0  Suicidal thoughts   0 0  1 0  PHQ-9 Score   9  17   20  7    Difficult doing work/chores   Extremely dIfficult Somewhat difficult  Extremely dIfficult      Data saved with a previous flowsheet row definition      Review of Systems  Musculoskeletal:  Positive for back pain.       Back pain, Bilateral leg pain  All other systems reviewed and are negative.      Objective:   Physical Exam     12/14/2023    8:15 AM 12/14/2023    8:10 AM 12/14/2023    8:05 AM  Vitals with BMI  Systolic  136 122  Diastolic  94 82  Pulse 89 76 96    Gen: no distress, normal appearing HEENT: oral mucosa pink and moist Chest: normal effort, normal rate of breathing Abd: soft, non-distended Ext: no edema Psych: Appropriate, pleasant Skin: intact Neuro: Alert and oriented, follows commands, cranial nerves II through XII grossly intact, normal speech and language RUE: 5/5 Deltoid, 5/5 Biceps, 5/5 Triceps, 5/5 Wrist Ext, 5/5 Grip LUE: 5/5 Deltoid, 5/5 Biceps, 5/5 Triceps, 5/5 Wrist Ext, 5/5 Grip RLE: HF 5/5, KE 5/5,ADF 5/5, APF 5/5 LLE: HF 5/5, KE 5/5, ADF 5/5, APF 5/5 Sensory exam normal for light touch and pain in all 4 limbs.  No ankle clonus   Musculoskeletal:  Tender L spine paraspinal muscles Slump test  negative Antalgic gait  Facet loading -not checked today  MRI from [facility] note 05/14/22 Lumbar MRI 05/13/22: Anatomical variants: none Alignment: Degenerative levoscoliosis. Conus medullaris: terminates at approximately L1. Spinal Cord and Cauda Equina: Visualized portions of the spinal cord is normal in morphology and signal. Normal appearance of the cauda equina.  Bone marrow signal: No suspicious lesions.  T12-L1: Mild disc and facet degenerative changes resulting in mild spinal canal stenosis.  L1-L2: Unremarkable.  L2-L3: Mild disc and facet degenerative changes without significant spinal canal or neural foraminal stenosis.  L3-L4: Disc bulge and facet arthropathy resulting in mild canal stenosis.  L4-L5: Mild disc and facet degenerative changes resulting in mild canal stenosis.  L5-S1: Advanced bilateral facet arthropathy and mild disc degenerative changes resulting in mild left neural foraminal stenosis.  IMPRESSION: Multilevel disc and facet degenerative changes above without high-grade stenosis.    L spine Xray from [facility] note  Spine x-rays 04/01/22: IMPRESSION: 1. S-shaped scoliosis of the thoracolumbar spine with positive sagittal and coronal imbalance. 2. Mild stepwise retrolisthesis from L2 to L5 without evidence of dynamic instability. 3. 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    MRI 09/06/23 IMPRESSION: Similar multilevel degenerative change, described above. No evidence of impingement.    Assessment & Plan:   1) Chronic low back pain with degenerative spinal canal stenosis and multilevel lumbar facet joint degeneration  2) Depression, denies SI or HI. On Lexapro  20 mg daily IMPRESSION: Similar multilevel degenerative change, described above. No evidence of impingement. 3) Dizziness when getting up intermittently.  Suspect orthostatic hypotension  4)Marijuanna product use     -Patient was previously started on  Lyrica  100 mg 3 times daily.  PCP has restarted gabapentin , he feels like it was better for his RLS.  He did not notice a significant pain improvement with the Lyrica  over gabapentin .   -Continue Gabapentin  400 mg in the morning and 800 mg at night. -Pt has degeneration in upper facet joints of the lumbar spine, consider medial branch block/RFA to upper lumbar spine.  Lower lumbar spine RFA was completed previously by Dr. Eldonna.  Patient was referred for medial branch block of upper lumbar spine.  Medial branch block bilateral T12, L1-L2 and this was completed on 10/24 without significant benefit reported. -Most recent symptoms appear most consistent with spinal stenosis.  Referred to Dr. Carilyn for L-spine ESI- injection completed 10/28/23- Pt reports minimal benefit -Completed aquatic therapy-[facility]  was helpful  -Advised to continue exercise in pool -Continue baclofen  5-10mg  TID PRN  -The patient will consider obtaining the X-rays of the spine ordered by the neurosurgeon Dr. Claudene.  He will follow up with the neurosurgeon's partner, Dr. Katrina, to discuss surgical options. -Journavx  for pain flairs discussed prior visit. Will place order for this medication.  - Reviewed policy regarding controlled substances, currently unable to prescribe as he is using THC products and he does not want to discontinue THC products at this time.  "

## 2024-01-12 NOTE — Progress Notes (Signed)
 " Electrophysiology Office Note:    Date:  01/12/2024   ID:  Brandon Frasier., DOB [birth year], MRN [id number]  PCP:  Wendolyn Jenkins Jansky, MD   [facility] Providers Cardiologist:  Danelle Birmingham, MD Electrophysiologist:  Eulas FORBES Furbish, MD     Referring MD: Wendolyn Jenkins Jansky, MD   History of Present Illness:    Brandon Almquist. is a 74 y.o. male with a hx listed below, significant for persistent atrial fibrillation, AVNRT s/p ablation referred for arrhythmia management.  He underwent an ablation by Dr. Kelsie for AF in the past. He has also had an ablation for AVNRT by Dr.  Birmingham.  He lives in [place].  He notes that he is very easily fatigued and is not nearly as capable as he was prior to recurrence of atrial fibrillation.  He had a repeat A-fib ablation and mapping of the atrium in February 2024.  He was noted to have a small amount of breakthrough at the right anterior veins.  There is also evidence of a prior posterior wall ablation with some breakthrough along the roof line.  200 J cardioversion was unsuccessful.  We were able to return to sinus rhythm with a 360 J shock.  He was seen in follow-up in A-fib clinic and, not surprisingly, had recurrence of atrial fibrillation.  He had a repeat cardioversion in May, but this was unsuccessful.   We had scheduled him for Tikosyn load, but he decided not to proceed due to fear of being in the hospital for days.  He reports that he feels much better on flecainide  and is maintaining sinus rhythm.  He required another cardioversion in November 2025.  He feels much better in sinus rhythm    EKGs/Labs/Other Studies Reviewed Today:          TTE: severe LAE   Recent Labs: 11/08/2023: ALT 18; Magnesium  2.1; Platelets 168.0 11/23/2023: TSH 9.27 12/14/2023: BUN 13; Creatinine, Ser 1.00; Hemoglobin 14.6; Potassium 3.2; Sodium 143     Physical Exam:    VS:  There were no vitals taken for this visit.    Wt Readings from Last  3 Encounters:  01/11/24 289 lb (131.1 kg)  12/14/23 286 lb (129.7 kg)  11/26/23 286 lb (129.7 kg)     GEN: Well nourished, well developed in no acute distress CARDIAC: Irregular rhythm, no murmurs, rubs, gallops RESPIRATORY:  Normal work of breathing MUSCULOSKELETAL: no edema    ASSESSMENT & PLAN:    Persistent AF:  Status post ablation by Dr. Kelsie, repeat ablation by me.   Very symptomatic.  His repeat procedure did not show many targets for ablation, so I do not think there will be any utility for additional ablation. He does not want to be hospitalized for Tikosyn load.  Continue flecainide  50mg  PO BID Continue carvedilol  25 I think he would benefit from a GLP-1 RA medication; there is some evidence that it can help lead to freedom from A-fib in addition to weight loss. We discussed importance of weight loss, which I think is the most important factor now and keeping him free of atrial fibrillation.     AVNRT:  s/p ablation  OSA:  will need to be compliant with CPAP to reduce risk of AF recurrence        Medication Adjustments/Labs and Tests Ordered: Current medicines are reviewed at length with the patient today.  Concerns regarding medicines are outlined above.  No orders of the defined types  were placed in this encounter.  No orders of the defined types were placed in this encounter.    Signed, Eulas FORBES Furbish, MD  01/12/2024 3:00 PM    [facility] "

## 2024-01-13 ENCOUNTER — Ambulatory Visit: Attending: Cardiovascular Disease | Admitting: Cardiovascular Disease

## 2024-01-13 ENCOUNTER — Other Ambulatory Visit: Payer: Self-pay | Admitting: Internal Medicine

## 2024-01-13 ENCOUNTER — Encounter: Payer: Self-pay | Admitting: Cardiovascular Disease

## 2024-01-13 VITALS — BP 110/80 | HR 64 | Ht 76.0 in | Wt 288.5 lb

## 2024-01-13 DIAGNOSIS — I480 Paroxysmal atrial fibrillation: Secondary | ICD-10-CM | POA: Diagnosis present

## 2024-01-13 DIAGNOSIS — I471 Supraventricular tachycardia, unspecified: Secondary | ICD-10-CM | POA: Insufficient documentation

## 2024-01-13 NOTE — Patient Instructions (Signed)

## 2024-01-14 ENCOUNTER — Encounter: Payer: Self-pay | Admitting: Family Medicine

## 2024-01-17 ENCOUNTER — Other Ambulatory Visit

## 2024-01-17 ENCOUNTER — Encounter: Payer: Self-pay | Admitting: Cardiovascular Disease

## 2024-01-18 ENCOUNTER — Ambulatory Visit: Payer: Self-pay | Admitting: Endocrinology

## 2024-01-18 LAB — TSH: TSH: 6.25 m[IU]/L — ABNORMAL HIGH (ref 0.40–4.50)

## 2024-01-18 LAB — T4, FREE: Free T4: 0.8 ng/dL (ref 0.8–1.8)

## 2024-01-18 LAB — T3, FREE: T3, Free: 3.1 pg/mL (ref 2.3–4.2)

## 2024-01-19 ENCOUNTER — Other Ambulatory Visit: Payer: Self-pay | Admitting: Family

## 2024-01-19 ENCOUNTER — Other Ambulatory Visit: Payer: Self-pay

## 2024-01-19 DIAGNOSIS — F331 Major depressive disorder, recurrent, moderate: Secondary | ICD-10-CM

## 2024-01-19 MED ORDER — BUPROPION HCL ER (XL) 150 MG PO TB24: 150.0000 mg | ORAL_TABLET | Freq: Every day | ORAL | 1 refills | Status: AC

## 2024-01-20 ENCOUNTER — Other Ambulatory Visit: Payer: Self-pay | Admitting: Cardiovascular Disease

## 2024-01-20 ENCOUNTER — Encounter: Payer: Self-pay | Admitting: Endocrinology

## 2024-01-20 ENCOUNTER — Ambulatory Visit (INDEPENDENT_AMBULATORY_CARE_PROVIDER_SITE_OTHER): Admitting: Endocrinology

## 2024-01-20 DIAGNOSIS — E0500 Thyrotoxicosis with diffuse goiter without thyrotoxic crisis or storm: Secondary | ICD-10-CM | POA: Diagnosis not present

## 2024-01-20 MED ORDER — METHIMAZOLE 5 MG PO TABS: 2.5000 mg | ORAL_TABLET | Freq: Every day | ORAL | 3 refills | Status: AC

## 2024-01-20 NOTE — Progress Notes (Signed)
 "  Outpatient Endocrinology Note Bannie Lobban, MD   Patient's Name: Brandon [name].    DOB: 02/11/1950    MRN: [id number]  REASON OF VISIT: Follow-up for hyperthyroidism  REFERRING PROVIDER: Wendolyn Jenkins Jansky, MD  PCP: Wendolyn Jenkins Jansky, MD  HISTORY OF PRESENT ILLNESS:   Brandon [name]. is a 74 y.o. old male with past medical history as listed below is presented for follow-up for hyperthyroidism / Graves' disease.   Pertinent Thyroid  History: Patient was referred to endocrinology for evaluation and management of hyperthyroidism.  Initial consult on August 13, 2023.  Patient has history of paroxysmal atrial fibrillation for several years, 5-7 years.  He had routine TSH checked on July 07, 2023 was low 0.02, repeat thyroid  function test on August.  2025 was suppressed TSH with elevated free T3 5.4 And elevated free T4 of 1.82. On August 12, 2023, he had lab with elevated thyrotropin receptor antibody 2.32, consistent with having Graves' disease causing hyperthyroidism.    At the initial visit, patient complains of mild diarrhea.  He occasionally gets palpitation and heat intolerance.  He has lost mild weight however he is also intentionally trying to lose weight.  Patient reports he has atrial fibrillation for 5 to 7 years status post ablation unsuccessful for several times.  He had normal thyroid  function test in the past as of April 2025 at that time TSH was 1.35.  He denies family history of thyroid  disorder.  He used to take amiodarone  for atrial fibrillation has been off of it since beginning of 2024.  No recent IV iodine  contrast use.  No recent illness.  He denies neck pain or any neck compressive symptoms.  No redness or watering of the eyes.  Labs:  Latest Reference Range & Units 08/12/23 09:11  Thyrotropin Receptor Ab 0.00 - 1.75 IU/L 2.32 (H)  (H): Data is abnormally high  Patient was started on methimazole  10 mg daily in August 2025.  Methimazole  dose has been gradually  decreased.  Interval history  He has been taking methimazole  5 mg daily, dose was decreased in last visit when TSH was 9.  He has noticed hair loss and mild weight gain.  Otherwise no complaints today.  No palpitation and heat intolerance.  Recent thyroid  function test with improvement on TSH however is still mildly elevated at 6.25 with normal free T4 and free T3 as follows.   Latest Reference Range & Units 01/17/24 09:14  TSH 0.40 - 4.50 mIU/L 6.25 (H)  Triiodothyronine,Free,Serum 2.3 - 4.2 pg/mL 3.1  T4,Free(Direct) 0.8 - 1.8 ng/dL 0.8  (H): Data is abnormally high  REVIEW OF SYSTEMS:  As per history of present illness.   PAST MEDICAL HISTORY: Past Medical History:  Diagnosis Date   Allergy 01/05/1664   Anxiety 05/12/1996   Arthritis 07/30/1993   Benign neoplasm of colon    Benign prostatic hyperplasia with urinary obstruction    Bilateral inguinal hernia 05/29/2011   Bilateral pulmonary embolism (HCC) 03/2012   admitted to [facility],  treated with Xarelto    Breast lump 03/29/2019   Calculus of kidney 10/21/2016   Cataract    Chronic bilateral low back pain with sciatica 03/21/2012   Chronic pain    Finger mass, right 10/17/2020   Graves disease    History of alcohol abuse 03/07/2016   History of colon polyps 11/18/2015   History of kidney stones    History of pulmonary embolism 03/19/2012   Hyperlipemia    Hypertension  Insomnia    Insomnia due to medical condition 12/23/2016   Polyuria secondary to BPH   Major depressive disorder, recurrent episode    Mitral regurgitation 04/24/2013   Mild by TEE   Morbid obesity (HCC) 03/07/2016   Myocardial infarction (HCC) 02/21/2003   Nonrheumatic mitral valve insufficiency 04/25/2013   Mild by TEE  Overview:  Overview:  Mild by TEE Overview:  Overview:  Overview:  Mild by TEE Overview:  Overview:  Mild by TEE   Obesity    OSA (obstructive sleep apnea)    noncompliant with CPAP.  07/27/13- awaiting a CPAP- unable to  tolerate mask   Osteoporosis    Persistent atrial fibrillation (HCC)    Recurrent unilateral inguinal hernia 07/17/2013   Restless leg syndrome    takes gabapentin    S/P right total knee arthroplasty 05/24/2018   Sciatica    Sleep apnea    Spinal stenosis, lumbar region with neurogenic claudication    SVT (supraventricular tachycardia) 03/07/2016   Thrombus of left atrial appendage 03/09/2013   Uncomplicated asthma 06/20/2008   Overview:  Overview:  Overview:  Overview:  Qualifier: Diagnosis of  By: Krystal MD, Reyes LABOR Overview:  Overview:  Qualifier: Diagnosis of  By: Krystal MD, Reyes LABOR   Ventral hernia, unspecified, without mention of obstruction or gangrene    right abdominal wall    PAST SURGICAL HISTORY: Past Surgical History:  Procedure Laterality Date   ATRIAL FIBRILLATION ABLATION N/A 02/27/2022   Procedure: ATRIAL FIBRILLATION ABLATION;  Surgeon: Nancey Eulas BRAVO, MD;  Location: [facility] INV[facility] LAB;  Service: Cardiovascular;  Laterality: N/A;   CARDIOVERSION N/A 03/21/2012   Procedure: CARDIOVERSION;  Surgeon: Salena GORMAN Negri, MD;  Location: [facility] ENDOSCOPY;  Service: Cardiovascular;  Laterality: N/A;   CARDIOVERSION N/A 04/24/2013   Procedure: CARDIOVERSION;  Surgeon: Leim VEAR Moose, MD;  Location: [facility] ENDOSCOPY;  Service: Cardiovascular;  Laterality: N/A;   CARDIOVERSION N/A 06/27/2015   Procedure: CARDIOVERSION;  Surgeon: Ezra GORMAN Shuck, MD;  Location: [facility] ENDOSCOPY;  Service: Cardiovascular;  Laterality: N/A;   CARDIOVERSION N/A 11/11/2021   Procedure: CARDIOVERSION;  Surgeon: Pietro Redell GORMAN, MD;  Location: [facility] ENDOSCOPY;  Service: Cardiovascular;  Laterality: N/A;   CARDIOVERSION N/A 05/11/2022   Procedure: CARDIOVERSION;  Surgeon: Santo Stanly LABOR, MD;  Location: [facility];  Service: Cardiovascular;  Laterality: N/A;   CARDIOVERSION N/A 07/13/2022   Procedure: CARDIOVERSION;  Surgeon: Francyne Headland, MD;  Location: [facility];  Service:  Cardiovascular;  Laterality: N/A;   CARDIOVERSION N/A 12/14/2023   Procedure: CARDIOVERSION;  Surgeon: Jeffrie Oneil BROCKS, MD;  Location: [facility];  Service: Cardiovascular;  Laterality: N/A;   CATARACT EXTRACTION     COLONOSCOPY W/ POLYPECTOMY     ELECTROPHYSIOLOGIC STUDY N/A 07/30/2015   Procedure: Atrial Fibrillation Ablation;  Surgeon: Lynwood Rakers, MD;  Location: [facility] INVASIVE CV LAB;  Service: Cardiovascular;  Laterality: N/A;   EXTRACORPOREAL SHOCK WAVE LITHOTRIPSY Right 10/29/2016   Procedure: RIGHT EXTRACORPOREAL SHOCK WAVE LITHOTRIPSY (ESWL);  Surgeon: Alvaro Hummer, MD;  Location: [facility] ORS;  Service: Urology;  Laterality: Right;   EYE SURGERY Right    cataract   FEMUR FRACTURE SURGERY     HERNIA REPAIR  07/06/2011   INGUINAL HERNIA REPAIR Right 07/28/2013   Procedure: RIGHT INGUINAL HERNIA REPAIR;  Surgeon: Donnice POUR. Belinda, MD;  Location: [facility] OR;  Service: General;  Laterality: Right;   INGUINAL HERNIA REPAIR Right 09/22/2016   Procedure: LAPAROSCOPIC RIGHT INGUINAL HERNIA;  Surgeon: Stevie Primes  Beverley, MD;  Location: [facility] ORS;  Service: General;  Laterality: Right;  With MESH   INSERTION OF MESH Right 07/28/2013   Procedure: INSERTION OF MESH;  Surgeon: Donnice POUR. Tsuei, MD;  Location: [facility] OR;  Service: General;  Laterality: Right;   JOINT REPLACEMENT  Both knees 2018   KNEE ARTHROSCOPY     left   REPLACEMENT TOTAL KNEE Left    ROTATOR CUFF REPAIR     right   ROTATOR CUFF REPAIR Left 03/08/2014   DR SUPPLE   SHOULDER ARTHROSCOPY WITH ROTATOR CUFF REPAIR AND SUBACROMIAL DECOMPRESSION Left 03/08/2014   Procedure: LEFT SHOULDER ARTHROSCOPY WITH ROTATOR CUFF REPAIR/SUBACROMIAL DECOMPRESSION/DISTAL CLAVICLE RESECTION;  Surgeon: Franky CHRISTELLA Pointer, MD;  Location: [facility] OR;  Service: Orthopedics;  Laterality: Left;   SVT ABLATION N/A 01/23/2019   Procedure: SVT ABLATION;  Surgeon: Waddell Danelle ORN, MD;  Location: [facility] INVASIVE CV LAB;  Service: Cardiovascular;  Laterality: N/A;   TEE  WITHOUT CARDIOVERSION N/A 03/21/2012   Procedure: TRANSESOPHAGEAL ECHOCARDIOGRAM (TEE);  Surgeon: Salena GORMAN Negri, MD;  Location: [facility] ENDOSCOPY;  Service: Cardiovascular;  Laterality: N/A;   TEE WITHOUT CARDIOVERSION N/A 04/24/2013   Procedure: TRANSESOPHAGEAL ECHOCARDIOGRAM (TEE);  Surgeon: Leim VEAR Moose, MD;  Location: [facility] ENDOSCOPY;  Service: Cardiovascular;  Laterality: N/A;   TEE WITHOUT CARDIOVERSION N/A 07/29/2015   Procedure: TRANSESOPHAGEAL ECHOCARDIOGRAM (TEE);  Surgeon: Vina LULLA Gull, MD;  Location: [facility] ENDOSCOPY;  Service: Cardiovascular;  Laterality: N/A;   TEE WITHOUT CARDIOVERSION N/A 05/11/2022   Procedure: TRANSESOPHAGEAL ECHOCARDIOGRAM;  Surgeon: Santo Stanly LABOR, MD;  Location: [facility]VAS[facility] LAB;  Service: Cardiovascular;  Laterality: N/A;   TONSILLECTOMY     TOTAL KNEE ARTHROPLASTY Right 05/24/2018   Procedure: RIGHT TOTAL KNEE ARTHROPLASTY;  Surgeon: Ernie Donnice, MD;  Location: [facility] ORS;  Service: Orthopedics;  Laterality: Right;  70 mins    ALLERGIES: Allergies  Allergen Reactions   Ace Inhibitors Cough   Albuterol  Other (See Comments)    Racing heart    FAMILY HISTORY:  Family History  Problem Relation Age of Onset   Cancer Father        bone   Heart failure Mother    Hypertension Mother    Asthma Mother    Cancer Paternal Grandmother        ovarian   CVA Other        Fam Hx of multiple myeloma   Diabetes Other        Fam Hx of DM    SOCIAL HISTORY: Social History   Socioeconomic History   Marital status: Married    Spouse name: Not on file   Number of children: 2   Years of education: college   Highest education level: Bachelor's degree (e.g., BA, AB, BS)  Occupational History   Occupation: RETIRED  Tobacco Use   Smoking status: Former    Current packs/day: 0.00    Average packs/day: 0.5 packs/day for 5.0 years (2.5 ttl pk-yrs)    Types: Cigarettes    Start date: 48    Quit date: 1976    Years since quitting: 50.0   Smokeless  tobacco: Never   Tobacco comments:    Former smoker 03/27/22  Vaping Use   Vaping status: Never Used  Substance and Sexual Activity   Alcohol use: No    Comment: Former EtOH abuse, stopped 06/2016   Drug use: Yes    Types: Marijuana    Comment: negative hx for IV drug abuse   Sexual activity: Not on  file  Other Topics Concern   Not on file  Social History Narrative   Lives alone in [place]   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   Prior [employer]   Wife lives in town (still married but lives separate)   Has biological daughter in [place] ([place]),  2 steps-1in [place] and 1 in [place])   Social Drivers of Health   Tobacco Use: Medium Risk (01/20/2024)   Patient History    Smoking Tobacco Use: Former    Smokeless Tobacco Use: Never    Passive Exposure: Not on Actuary Strain: Low Risk (11/05/2023)   Overall Financial Resource Strain (CARDIA)    Difficulty of Paying Living Expenses: Not hard at all  Food Insecurity: No Food Insecurity (11/05/2023)   Epic    Worried About Programme Researcher, Broadcasting/film/video in the Last Year: Never true    Ran Out of Food in the Last Year: Never true  Transportation Needs: No Transportation Needs (11/05/2023)   Epic    Lack of Transportation (Medical): No    Lack of Transportation (Non-Medical): No  Physical Activity: Inactive (11/05/2023)   Exercise Vital Sign    Days of Exercise per Week: 0 days    Minutes of Exercise per Session: Not on file  Stress: Stress Concern Present (11/05/2023)   [employer] of Occupational Health - Occupational Stress Questionnaire    Feeling of Stress: To some extent  Social Connections: Socially Isolated (11/05/2023)   Social Connection and Isolation Panel    Frequency of Communication with Friends and Family: Never    Frequency of Social Gatherings with Friends and Family: Never    Attends Religious Services: Never    Database Administrator or Organizations: Yes    Attends Banker Meetings: Never     Marital Status: Separated  Depression (PHQ2-9): Medium Risk (01/11/2024)   Depression (PHQ2-9)    PHQ-2 Score: 6  Alcohol Screen: Low Risk (04/02/2023)   Alcohol Screen    Last Alcohol Screening Score (AUDIT): 0  Housing: Low Risk (11/05/2023)   Epic    Unable to Pay for Housing in the Last Year: No    Number of Times Moved in the Last Year: 1    Homeless in the Last Year: No  Utilities: Not At Risk (04/02/2023)   AHC Utilities    Threatened with loss of utilities: No  Health Literacy: Adequate Health Literacy (04/02/2023)   B1300 Health Literacy    Frequency of need for help with medical instructions: Never    MEDICATIONS:  Current Outpatient Medications  Medication Sig Dispense Refill   amLODipine  (NORVASC ) 5 MG tablet TAKE 1 TABLET (5 MG TOTAL) BY MOUTH DAILY. (Patient taking differently: Take 5 mg by mouth every evening.) 30 tablet 0   atorvastatin  (LIPITOR) 40 MG tablet TAKE 1 TABLET BY MOUTH EVERY DAY 90 tablet 1   baclofen  (LIORESAL ) 10 MG tablet TAKE 1/2-1 TABLET BY MOUTH EVERY 8 (EIGHT) HOURS AS NEEDED FOR MUSCLE SPASMS. 180 tablet 1   buPROPion  (WELLBUTRIN  XL) 150 MG 24 hr tablet Take 1 tablet (150 mg total) by mouth daily. 90 tablet 1   carvedilol  (COREG ) 25 MG tablet TAKE 1 TABLET (25 MG TOTAL) BY MOUTH TWICE A DAY WITH MEALS 180 tablet 0   Cyanocobalamin  (VITAMIN B-12 PO) Take 1 tablet by mouth every morning.     ELIQUIS  5 MG TABS tablet TAKE 1 TABLET BY MOUTH TWICE A DAY 60 tablet 5   eplerenone  (INSPRA ) 25 MG tablet  TAKE 1 TABLET (25 MG TOTAL) BY MOUTH DAILY. 90 tablet 1   escitalopram  (LEXAPRO ) 20 MG tablet Take 1 tablet (20 mg total) by mouth daily. 90 tablet 1   flecainide  (TAMBOCOR ) 50 MG tablet Take 1 tablet (50 mg total) by mouth 2 (two) times daily.     fluticasone  (FLONASE ) 50 MCG/ACT nasal spray SPRAY 2 SPRAYS INTO EACH NOSTRIL EVERY DAY (Patient taking differently: Place 2 sprays into both nostrils daily as needed for allergies.) 48 mL 1   gabapentin  (NEURONTIN )  400 MG capsule TAKE 1 CAPSULE (400 MG TOTAL) BY MOUTH DAILY. 30 capsule 2   gabapentin  (NEURONTIN ) 800 MG tablet Take 1 tablet (800 mg total) by mouth at bedtime. 90 tablet 6   ketoconazole  (NIZORAL ) 2 % cream Apply 1 Application topically 2 (two) times daily. (Patient taking differently: Apply 1 Application topically daily as needed (rash).) 60 g 0   MAGNESIUM  PO Take 1 tablet by mouth every evening.     memantine  (NAMENDA ) 10 MG tablet Take 1 tablet (10 mg total) by mouth 2 (two) times daily. 180 tablet 1   potassium chloride  SA (KLOR-CON  M) 20 MEQ tablet Take 2 tablets (40 mEq total) by mouth 2 (two) times daily. (Patient taking differently: Take 20 mEq by mouth every evening.) 360 tablet 1   Suzetrigine  50 MG TABS Take 50 mg by mouth every 12 (twelve) hours as needed. Take two 50mg  capsules for your first dose at the start of a pain episode on an empty stomach, followed by 50mg  (1 capsule)  with or without food every 12 hours until pain episode is improved 29 tablet 0   tamsulosin  (FLOMAX ) 0.4 MG CAPS capsule Take 1 capsule (0.4 mg total) by mouth daily. TAKE 1 CAPSULE BY MOUTH EVERY DAY. Patient to follow up with PCP prior to future refills 90 capsule 3   methimazole  (TAPAZOLE ) 5 MG tablet Take 0.5 tablets (2.5 mg total) by mouth daily. 45 tablet 3   No current facility-administered medications for this visit.    PHYSICAL EXAM: Vitals:   01/20/24 1003  BP: 110/72  Pulse: (!) 59  Resp: 16  SpO2: 95%  Weight: 288 lb 3.2 oz (130.7 kg)  Height: 6' 4 (1.93 m)    Body mass index is 35.08 kg/m.  Wt Readings from Last 3 Encounters:  01/20/24 288 lb 3.2 oz (130.7 kg)  01/13/24 288 lb 8 oz (130.9 kg)  01/11/24 289 lb (131.1 kg)    General: Well developed, well nourished male in no apparent distress.  HEENT: AT/[place], no external lesions. Hearing intact to the spoken word Eyes: EOMI. No stare, proptosis or lid lag. Conjunctiva clear and no icterus. No erythema or watering Neck: Trachea  midline, neck supple without appreciable thyromegaly or lymphadenopathy and no palpable thyroid  nodules Lungs: Clear to auscultation, no wheeze. Respirations not labored Heart: S1S2, irregular in rate and rhythm. Abdomen: Soft, non tender, non distended Neurologic: Alert, oriented, normal speech, deep tendon biceps reflexes 1+,  no gross focal neurological deficit Extremities: No pedal pitting edema, no tremors of outstretched hands Skin: Warm, color good.  Psychiatric: Does not appear depressed or anxious  PERTINENT HISTORIC LABORATORY AND IMAGING STUDIES:  All pertinent laboratory results were reviewed. Please see HPI also for further details.   TSH  Date Value Ref Range Status  01/17/2024 6.25 (H) 0.40 - 4.50 mIU/L Final  11/23/2023 9.27 (H) 0.40 - 4.50 mIU/L Final  09/27/2023 0.47 0.40 - 4.50 mIU/L Final    Lab Results  Component Value Date   FREET4 0.8 01/17/2024   FREET4 0.6 (L) 11/23/2023   FREET4 0.9 09/27/2023   T3FREE 3.1 01/17/2024   T3FREE 2.5 11/23/2023   T3FREE 3.0 09/27/2023   TSH 6.25 (H) 01/17/2024   TSH 9.27 (H) 11/23/2023   TSH 0.47 09/27/2023    Lab Results  Component Value Date   THYROTRECAB 2.32 (H) 08/12/2023    Lab Results  Component Value Date   TSH 6.25 (H) 01/17/2024   TSH 9.27 (H) 11/23/2023   TSH 0.47 09/27/2023   FREET4 0.8 01/17/2024   FREET4 0.6 (L) 11/23/2023   FREET4 0.9 09/27/2023     No results found for: TSI   No components found for: TRAB    ASSESSMENT / PLAN  1. Graves disease     The hyperthyroidism is most likely due to Graves disease. Patient has thyrotropin receptor antibody positive.  Starting dose of methimazole  was 10 mg daily.  Patient has currently iatrogenic hypothyroid on methimazole .  He is currently taking methimazole  5 mg daily.   The three options of therapy for hyperthyroidism were discussed with the patient, including thionamide drug therapy, thyroidectomy, and radioactive iodine  ablation. I reviewed  the possible complications of rash, agranulocytosis, or liver dysfunction associated with anti-thyroid  therapies. I reviewed the risks of hemorrhage, hypocalcemia, hoarseness and hypothyroidism after thyroidectomy. Regarding radioactive iodine  ablation, I informed the patient that most patients treated with radioactive iodine  can be cured of hyperthyroidism with a single dose, but to about 15-20% may require an additional dose. I emphasized that most patients treated with radioactive iodine  develop permanent hypothyroidism, requiring lifelong replacement with thyroid  hormone. I discussed the rare occurrence of transient increase in thyroid  hormone levels after radioactive iodine  and associated with symptoms, possible worsening of Grave's eye disease, and the likely small but not insignificant risks associated with radiation exposure of this kind.   Will treat with antithyroid medication at this time.Okay to be on antithyroid medication at this time to see if spontaneous resolution occurs.   Discussed that especially with having atrial fibrillation it is important to keep thyroid  hormone levels in the normal range.  Overactive thyroid  can aggravate atrial fibrillation.  Plan: - Decrease methimazole  from 5 to 2.5 mg daily. -He needs close endocrinology follow-up. - Follow-up in 2 months s with labs prior to follow-up visit.   Diagnoses and all orders for this visit:  Graves disease -     methimazole  (TAPAZOLE ) 5 MG tablet; Take 0.5 tablets (2.5 mg total) by mouth daily. -     TRAb (TSH Receptor Binding Antibody) -     T4, free -     T3, free -     TSH    DISPOSITION Follow up in clinic in 2 months suggested.  Labs prior to follow-up visit as ordered.  All questions answered and patient verbalized understanding of the plan.  Brandon Labrador, MD [facility] Endocrinology [facility] [address] [place], [address] [place], [place] [address] Phone # [phone]   At least part of this  note was generated using voice recognition software. Inadvertent word errors may have occurred, which were not recognized during the proofreading process. "

## 2024-01-23 NOTE — Progress Notes (Unsigned)
 [id number]  PATIENT: Brandon [name]. DOB: [birth year]  REFERRING CLINICIAN: Jayson Sor  HISTORY FROM: Patient  REASON FOR VISIT: OSA and RLS   HISTORICAL  CHIEF COMPLAINT:  No chief complaint on file.   HISTORY OF PRESENT ILLNESS:  [name] is a 74yo man with obstructive sleep apnea and restless leg syndrome.  I had previously seen him in 2016/2017 for obstructive sleep apnea.  OSA:   He was diagnosed with OSA in 2015 but didn't tolerate CPAP and turned it in (2015).     He felt mildly claustophobic and was uncomfortable with CPAP so he often took mask off in middle of the night.   He had moderate OSA according to notes from 2016.S[facility]  No major sleepiness.   He breaths through his mouth and he notes dry mouth.  He had a split-night study 01/16/2015 which showed AHI of 41.7.  He was titrated up to CPAP 9 cm H2O pressure with excellent control of the sleep apnea.  The sleep study also showed severe periodic limb movements of sleep with an index of 71/h but these were not associated with arousals. [facility] ret[facility]    He saw Dr. Micky about an oral appliance February 2019.  RLS/PLMS:   He also had severe RLS/PLMS.   Gabapentin  has helped.      For a while he was on valium /gabapentin  combination but combo was just slightly better and he stopped valium   Nocturia:   He often wakes up 3 times to urinate (usually goes 4-5 hours straight and then up every hour).   He has hesitancy and urgency and is doing much better on Flomax  (was having nocturia 4-5 times nightly).    Back pain/sciatica is worse and interfering with sleep.   He sees ortho in 2 weeks.    He feels his lower back is stiff and tender   EPWORTH SLEEPINESS SCALE  On a scale of 0 - 3 what is the chance of dozing:  Sitting and Reading:   0 Watching TV:    2 Sitting inactive in a public place: 0 Passenger in car for one hour: 0 Lying down to rest in the  afternoon: 2 Sitting and talking to someone: 0 Sitting quietly after lunch:  0 In a car, stopped in traffic:  0  Total (out of 24):   4/24   REVIEW OF SYSTEMS:  Constitutional: No fevers, chills, sweats, or change in appetite.   RLS/PLMS Eyes: No visual changes, double vision, eye pain Ear, nose and throat: No hearing loss, ear pain, nasal congestion, sore throat.   Notes tinnitus. Cardiovascular: No chest pain, palpitations Respiratory:  No shortness of breath at rest or with exertion.   Has OSA GastrointestinaI: No nausea, vomiting, diarrhea, abdominal pain, fecal incontinence Genitourinary:  No dysuria, urinary retention or frequency.  No nocturia. Musculoskeletal: he notes back pain.   Has right knee pain Integumentary: No rash, pruritus, skin lesions Neurological: as above Psychiatric: No depression at this time.  No anxiety Endocrine: No palpitations, diaphoresis, change in appetite, change in weigh or increased thirst Hematologic/Lymphatic:  No anemia, purpura, petechiae. Allergic/Immunologic: No itchy/runny eyes, nasal congestion, recent allergic reactions, rashes  ALLERGIES: Allergies  Allergen Reactions   Ace Inhibitors Cough   Albuterol  Other (See Comments)    Racing heart    HOME MEDICATIONS: Outpatient Medications Prior to Visit  Medication Sig Dispense Refill   amLODipine  (NORVASC ) 5 MG tablet  TAKE 1 TABLET (5 MG TOTAL) BY MOUTH DAILY. 90 tablet 3   atorvastatin  (LIPITOR) 40 MG tablet TAKE 1 TABLET BY MOUTH EVERY DAY 90 tablet 1   baclofen  (LIORESAL ) 10 MG tablet TAKE 1/2-1 TABLET BY MOUTH EVERY 8 (EIGHT) HOURS AS NEEDED FOR MUSCLE SPASMS. 180 tablet 1   buPROPion  (WELLBUTRIN  XL) 150 MG 24 hr tablet Take 1 tablet (150 mg total) by mouth daily. 90 tablet 1   carvedilol  (COREG ) 25 MG tablet TAKE 1 TABLET (25 MG TOTAL) BY MOUTH TWICE A DAY WITH MEALS 180 tablet 0   Cyanocobalamin  (VITAMIN B-12 PO) Take 1 tablet by mouth every morning.     ELIQUIS  5 MG TABS tablet  TAKE 1 TABLET BY MOUTH TWICE A DAY 60 tablet 5   eplerenone  (INSPRA ) 25 MG tablet TAKE 1 TABLET (25 MG TOTAL) BY MOUTH DAILY. 90 tablet 1   escitalopram  (LEXAPRO ) 20 MG tablet Take 1 tablet (20 mg total) by mouth daily. 90 tablet 1   flecainide  (TAMBOCOR ) 50 MG tablet Take 1 tablet (50 mg total) by mouth 2 (two) times daily.     fluticasone  (FLONASE ) 50 MCG/ACT nasal spray SPRAY 2 SPRAYS INTO EACH NOSTRIL EVERY DAY (Patient taking differently: Place 2 sprays into both nostrils daily as needed for allergies.) 48 mL 1   gabapentin  (NEURONTIN ) 400 MG capsule TAKE 1 CAPSULE (400 MG TOTAL) BY MOUTH DAILY. 30 capsule 2   gabapentin  (NEURONTIN ) 800 MG tablet Take 1 tablet (800 mg total) by mouth at bedtime. 90 tablet 6   ketoconazole  (NIZORAL ) 2 % cream Apply 1 Application topically 2 (two) times daily. (Patient taking differently: Apply 1 Application topically daily as needed (rash).) 60 g 0   MAGNESIUM  PO Take 1 tablet by mouth every evening.     memantine  (NAMENDA ) 10 MG tablet Take 1 tablet (10 mg total) by mouth 2 (two) times daily. 180 tablet 1   methimazole  (TAPAZOLE ) 5 MG tablet Take 0.5 tablets (2.5 mg total) by mouth daily. 45 tablet 3   potassium chloride  SA (KLOR-CON  M) 20 MEQ tablet Take 2 tablets (40 mEq total) by mouth 2 (two) times daily. (Patient taking differently: Take 20 mEq by mouth every evening.) 360 tablet 1   Suzetrigine  50 MG TABS Take 50 mg by mouth every 12 (twelve) hours as needed. Take two 50mg  capsules for your first dose at the start of a pain episode on an empty stomach, followed by 50mg  (1 capsule)  with or without food every 12 hours until pain episode is improved 29 tablet 0   tamsulosin  (FLOMAX ) 0.4 MG CAPS capsule Take 1 capsule (0.4 mg total) by mouth daily. TAKE 1 CAPSULE BY MOUTH EVERY DAY. Patient to follow up with PCP prior to future refills 90 capsule 3   No facility-administered medications prior to visit.    PAST MEDICAL HISTORY: Past Medical History:   Diagnosis Date   Allergy 01/05/1664   Anxiety 05/12/1996   Arthritis 07/30/1993   Benign neoplasm of colon    Benign prostatic hyperplasia with urinary obstruction    Bilateral inguinal hernia 05/29/2011   Bilateral pulmonary embolism (HCC) 03/2012   admitted to [facility],  treated with Xarelto    Breast lump 03/29/2019   Calculus of kidney 10/21/2016   Cataract    Chronic bilateral low back pain with sciatica 03/21/2012   Chronic pain    Finger mass, right 10/17/2020   Graves disease    History of alcohol abuse 03/07/2016   History of colon  polyps 11/18/2015   History of kidney stones    History of pulmonary embolism 03/19/2012   Hyperlipemia    Hypertension    Insomnia    Insomnia due to medical condition 12/23/2016   Polyuria secondary to BPH   Major depressive disorder, recurrent episode    Mitral regurgitation 04/24/2013   Mild by TEE   Morbid obesity (HCC) 03/07/2016   Myocardial infarction (HCC) 02/21/2003   Nonrheumatic mitral valve insufficiency 04/25/2013   Mild by TEE  Overview:  Overview:  Mild by TEE Overview:  Overview:  Overview:  Mild by TEE Overview:  Overview:  Mild by TEE   Obesity    OSA (obstructive sleep apnea)    noncompliant with CPAP.  07/27/13- awaiting a CPAP- unable to tolerate mask   Osteoporosis    Persistent atrial fibrillation (HCC)    Recurrent unilateral inguinal hernia 07/17/2013   Restless leg syndrome    takes gabapentin    S/P right total knee arthroplasty 05/24/2018   Sciatica    Sleep apnea    Spinal stenosis, lumbar region with neurogenic claudication    SVT (supraventricular tachycardia) 03/07/2016   Thrombus of left atrial appendage 03/09/2013   Uncomplicated asthma 06/20/2008   Overview:  Overview:  Overview:  Overview:  Qualifier: Diagnosis of  By: Krystal MD, Reyes LABOR Overview:  Overview:  Qualifier: Diagnosis of  By: Krystal MD, Reyes LABOR   Ventral hernia, unspecified, without mention of obstruction or gangrene    right  abdominal wall    PAST SURGICAL HISTORY: Past Surgical History:  Procedure Laterality Date   ATRIAL FIBRILLATION ABLATION N/A 02/27/2022   Procedure: ATRIAL FIBRILLATION ABLATION;  Surgeon: Nancey Eulas BRAVO, MD;  Location: [facility] LAB;  Service: Cardiovascular;  Laterality: N/A;   CARDIOVERSION N/A 03/21/2012   Procedure: CARDIOVERSION;  Surgeon: Salena GORMAN Negri, MD;  Location: [facility] ENDOSCOPY;  Service: Cardiovascular;  Laterality: N/A;   CARDIOVERSION N/A 04/24/2013   Procedure: CARDIOVERSION;  Surgeon: Leim VEAR Moose, MD;  Location: [facility] ENDOSCOPY;  Service: Cardiovascular;  Laterality: N/A;   CARDIOVERSION N/A 06/27/2015   Procedure: CARDIOVERSION;  Surgeon: Ezra GORMAN Shuck, MD;  Location[facility] ENDOSCOPY;  Service: Cardiovascular;  Laterality: N/A;   CARDIOVERSION N/A 11/11/2021   Procedure: CARDIOVERSION;  Surgeon: Pietro Redell GORMAN, MD;  Location: [facility] ENDOSCOPY;  Service: Cardiovascular;  Laterality: N/A;   CARDIOVERSION N/A 05/11/2022   Procedure: CARDIOVERSION;  Surgeon: Santo Stanly LABOR, MD;  Location: [facility];  Service: Cardiovascular;  Laterality: N/A;   CARDIOVERSION N/A 07/13/2022   Procedure: CARDIOVERSION;  Surgeon: Francyne Headland, MD;  Location: [facility];  Service: Cardiovascular;  Laterality: N/A;   CARDIOVERSION N/A 12/14/2023   Procedure: CARDIOVERSION;  Surgeon: Jeffrie Oneil BROCKS, MD;  Location: [facility];  Service: Cardiovascular;  Laterality: N/A;   CATARACT EXTRACTION     COLONOSCOPY W/ POLYPECTOMY     ELECTROPHYSIOLOGIC STUDY N/A 07/30/2015   Procedure: Atrial Fibrillation Ablation;  Surgeon: Lynwood Rakers, MD;  Location: [facility] INVASIVE CV LAB;  Service: Cardiovascular;  Laterality: N/A;   EXTRACORPOREAL SHOCK WAVE LITHOTRIPSY Right 10/29/2016   Procedure: RIGHT EXTRACORPOREAL SHOCK WAVE LITHOTRIPSY (ESWL);  Surgeon: Alvaro Hummer, MD;  Location: [facility] ORS;  Service: Urology;  Laterality: Right;   EYE SURGERY Right    cataract    FEMUR FRACTURE SURGERY     HERNIA REPAIR  07/06/2011   INGUINAL HERNIA REPAIR Right 07/28/2013   Procedure: RIGHT INGUINAL HERNIA REPAIR;  Surgeon: Donnice POUR. Belinda, MD;  Location:  [facility] OR;  Service: General;  Laterality: Right;   INGUINAL HERNIA REPAIR Right 09/22/2016   Procedure: LAPAROSCOPIC RIGHT INGUINAL HERNIA;  Surgeon: Kinsinger, Herlene Righter, MD;  Location: [facility] ORS;  Service: General;  Laterality: Right;  With MESH   INSERTION OF MESH Right 07/28/2013   Procedure: INSERTION OF MESH;  Surgeon: Donnice POUR. Tsuei, MD;  Location: [facility] OR;  Service: General;  Laterality: Right;   JOINT REPLACEMENT  Both knees 2018   KNEE ARTHROSCOPY     left   REPLACEMENT TOTAL KNEE Left    ROTATOR CUFF REPAIR     right   ROTATOR CUFF REPAIR Left 03/08/2014   DR SUPPLE   SHOULDER ARTHROSCOPY WITH ROTATOR CUFF REPAIR AND SUBACROMIAL DECOMPRESSION Left 03/08/2014   Procedure: LEFT SHOULDER ARTHROSCOPY WITH ROTATOR CUFF REPAIR/SUBACROMIAL DECOMPRESSION/DISTAL CLAVICLE RESECTION;  Surgeon: Franky CHRISTELLA Pointer, MD;  Location: [facility] OR;  Service: Orthopedics;  Laterality: Left;   SVT ABLATION N/A 01/23/2019   Procedure: SVT ABLATION;  Surgeon: Waddell Danelle ORN, MD;  Location: [facility] INVASIVE CV LAB;  Service: Cardiovascular;  Laterality: N/A;   TEE WITHOUT CARDIOVERSION N/A 03/21/2012   Procedure: TRANSESOPHAGEAL ECHOCARDIOGRAM (TEE);  Surgeon: Salena GORMAN Negri, MD;  Location: [facility] ENDOSCOPY;  Service: Cardiovascular;  Laterality: N/A;   TEE WITHOUT CARDIOVERSION N/A 04/24/2013   Procedure: TRANSESOPHAGEAL ECHOCARDIOGRAM (TEE);  Surgeon: Leim VEAR Moose, MD;  Location: [facility] ENDOSCOPY;  Service: Cardiovascular;  Laterality: N/A;   TEE WITHOUT CARDIOVERSION N/A 07/29/2015   Procedure: TRANSESOPHAGEAL ECHOCARDIOGRAM (TEE);  Surgeon: Vina LULLA Gull, MD;  Location: [facility] ENDOSCOPY;  Service: Cardiovascular;  Laterality: N/A;   TEE WITHOUT CARDIOVERSION N/A 05/11/2022   Procedure: TRANSESOPHAGEAL ECHOCARDIOGRAM;  Surgeon: Santo Stanly LABOR, MD;  Location: [facility]AS[facility] CV [facility];  Service: Cardiovascular;  Laterality: N/A;   TONSILLECTOMY     TOTAL KNEE ARTHROPLASTY Right 05/24/2018   Procedure: RIGHT TOTAL KNEE ARTHROPLASTY;  Surgeon: Ernie Donnice, MD;  Location: [facility] ORS;  Service: Orthopedics;  Laterality: Right;  70 mins    FAMILY HISTORY: Family History  Problem Relation Age of Onset   Cancer Father        bone   Heart failure Mother    Hypertension Mother    Asthma Mother    Cancer Paternal Grandmother        ovarian   CVA Other        Fam Hx of multiple myeloma   Diabetes Other        Fam Hx of DM    SOCIAL HISTORY:  Social History   Socioeconomic History   Marital status: Married    Spouse name: Not on file   Number of children: 2   Years of education: college   Highest education level: Bachelor's degree (e.g., BA, AB, BS)  Occupational History   Occupation: RETIRED  Tobacco Use   Smoking status: Former    Current packs/day: 0.00    Average packs/day: 0.5 packs/day for 5.0 years (2.5 ttl pk-yrs)    Types: Cigarettes    Start date: 63    Quit date: 1976    Years since quitting: 50.0   Smokeless tobacco: Never   Tobacco comments:    Former smoker 03/27/22  Vaping Use   Vaping status: Never Used  Substance and Sexual Activity   Alcohol use: No    Comment: Former EtOH abuse, stopped 06/2016   Drug use: Yes    Types: Marijuana    Comment: negative hx for IV drug abuse   Sexual activity: Not  on file  Other Topics Concern   Not on file  Social History Narrative   Lives alone in [place]   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   Prior [employer]   Wife lives in town (still married but lives separate)   Has biological daughter in [place] ([employer] [place]),  2 steps-1in [place] and 1 in [place])   Social Drivers of Health   Tobacco Use: Medium Risk (01/20/2024)   Patient History    Smoking Tobacco Use: Former    Smokeless Tobacco Use: Never    Passive Exposure: Not on Actuary Strain: Low Risk  (11/05/2023)   Overall Financial Resource Strain (CARDIA)    Difficulty of Paying Living Expenses: Not hard at all  Food Insecurity: No Food Insecurity (11/05/2023)   Epic    Worried About Programme Researcher, Broadcasting/film/video in the Last Year: Never true    Ran Out of Food in the Last Year: Never true  Transportation Needs: No Transportation Needs (11/05/2023)   Epic    Lack of Transportation (Medical): No    Lack of Transportation (Non-Medical): No  Physical Activity: Inactive (11/05/2023)   Exercise Vital Sign    Days of Exercise per Week: 0 days    Minutes of Exercise per Session: Not on file  Stress: Stress Concern Present (11/05/2023)   [employer] of Occupational Health - Occupational Stress Questionnaire    Feeling of Stress: To some extent  Social Connections: Socially Isolated (11/05/2023)   Social Connection and Isolation Panel    Frequency of Communication with Friends and Family: Never    Frequency of Social Gatherings with Friends and Family: Never    Attends Religious Services: Never    Database Administrator or Organizations: Yes    Attends Banker Meetings: Never    Marital Status: Separated  Intimate Partner Violence: Not At Risk (12/10/2021)   Humiliation, Afraid, Rape, and Kick questionnaire    Fear of Current or Ex-Partner: No    Emotionally Abused: No    Physically Abused: No    Sexually Abused: No  Depression (PHQ2-9): Medium Risk (01/11/2024)   Depression (PHQ2-9)    PHQ-2 Score: 6  Alcohol Screen: Low Risk (04/02/2023)   Alcohol Screen    Last Alcohol Screening Score (AUDIT): 0  Housing: Low Risk (11/05/2023)   Epic    Unable to Pay for Housing in the Last Year: No    Number of Times Moved in the Last Year: 1    Homeless in the Last Year: No  Utilities: Not At Risk (04/02/2023)   AHC Utilities    Threatened with loss of utilities: No  Health Literacy: Adequate Health Literacy (04/02/2023)   B1300 Health Literacy    Frequency of need for help with  medical instructions: Never     PHYSICAL EXAM  There were no vitals filed for this visit.   There is no height or weight on file to calculate BMI.   General: The patient is well-developed and well-nourished and in no acute distress  Neck: The neck is supple.  The neck is nontender.   Pharynx is Mallampati 1  Neurologic Exam  Mental status: The patient is alert and oriented x 3 at the time of the examination. The patient has apparent normal recent and remote memory, with an apparently normal attention span and concentration ability.   Cranial nerves: Extraocular movements are full.  Facial symmetry is present. Facial strength is normal. No dysarthria is noted.  The tongue is midline,  and the patient has symmetric elevation of the soft palate.   Motor:  Muscle bulk and tone are normal. Strength is  5 / 5 in all 4 extremities.   Sensory: Sensory testing is intact to touch on all 4 extremities.  Gait and station: Station and gait are normal. Tandem gait is mildly wide.   Reflexes: Deep tendon reflexes are symmetric and normal bilaterally. Plantar responses are normal.    DIAGNOSTIC DATA (LABS, IMAGING, TESTING) - I reviewed patient records, labs, notes, testing and imaging myself where available.  Lab Results  Component Value Date   WBC 6.6 11/08/2023   HGB 14.6 12/14/2023   HCT 43.0 12/14/2023   MCV 87.5 11/08/2023   PLT 168.0 11/08/2023      Component Value Date/Time   NA 143 12/14/2023 0717   NA 142 05/14/2023 1000   K 3.2 (L) 12/14/2023 0717   CL 100 12/14/2023 0717   CO2 34 (H) 11/08/2023 1038   GLUCOSE 106 (H) 12/14/2023 0717   BUN 13 12/14/2023 0717   BUN 9 05/14/2023 1000   CREATININE 1.00 12/14/2023 0717   CREATININE 1.02 08/06/2015 0927   CALCIUM  9.3 11/08/2023 1038   PROT 7.0 11/08/2023 1038   PROT 6.7 03/09/2018 1018   ALBUMIN 4.3 11/08/2023 1038   ALBUMIN 4.5 03/09/2018 1018   AST 15 11/08/2023 1038   ALT 18 11/08/2023 1038   ALKPHOS 123 (H)  11/08/2023 1038   BILITOT 0.6 11/08/2023 1038   BILITOT 0.4 03/09/2018 1018   GFRNONAA >60 04/16/2023 1335   GFRAA 100 01/11/2020 1023   Lab Results  Component Value Date   CHOL 105 07/07/2023   HDL 35.70 (L) 07/07/2023   LDLCALC 55 07/07/2023   LDLDIRECT 138 (H) 10/21/2016   TRIG 72.0 07/07/2023   CHOLHDL 3 07/07/2023   Lab Results  Component Value Date   HGBA1C 5.6 07/07/2023   Lab Results  Component Value Date   VITAMINB12 145 (L) 08/06/2023   Lab Results  Component Value Date   TSH 6.25 (H) 01/17/2024   No components found for: VITAMIND     ASSESSMENT AND PLAN  No diagnosis found.  1.   We will set up a PSG split night study and set up an AutoPap based on the findings.   Treatment may help AFib and HTN and nocturia.    We also discussed an oral appliance and surgery.    2.  Rrestless leg syndrome/PLMS did best on the combination of gabapentin  in the evening and bedtime and Valium  at bedtime. Continue on those medicines and I will re-prescribe 3.  Continue Flomax .   Consider night time oxybutynin for nocturia/urgency  He will return to see me in 4 months or sooner if he has new or worsening neurologic symptoms.   Arvis Miguez A. Vear, MD, PhD 01/23/2024, 4:56 PM Certified in Neurology, Clinical Neurophysiology, Sleep Medicine, Pain Medicine and Neuroimaging  [facility] [address], [address] [place], [place] [address] [phone]

## 2024-01-24 ENCOUNTER — Ambulatory Visit: Admitting: Neurology

## 2024-01-24 ENCOUNTER — Encounter: Payer: Self-pay | Admitting: Neurology

## 2024-01-24 VITALS — BP 135/78 | HR 82 | Ht 76.0 in | Wt 288.0 lb

## 2024-01-24 DIAGNOSIS — R3915 Urgency of urination: Secondary | ICD-10-CM | POA: Diagnosis not present

## 2024-01-24 DIAGNOSIS — I10 Essential (primary) hypertension: Secondary | ICD-10-CM | POA: Diagnosis not present

## 2024-01-24 DIAGNOSIS — I480 Paroxysmal atrial fibrillation: Secondary | ICD-10-CM

## 2024-01-24 DIAGNOSIS — G2581 Restless legs syndrome: Secondary | ICD-10-CM

## 2024-01-24 DIAGNOSIS — R351 Nocturia: Secondary | ICD-10-CM | POA: Diagnosis not present

## 2024-01-24 DIAGNOSIS — G4733 Obstructive sleep apnea (adult) (pediatric): Secondary | ICD-10-CM

## 2024-01-24 MED ORDER — ROPINIROLE HCL 0.5 MG PO TABS: ORAL_TABLET | ORAL | 11 refills | Status: AC

## 2024-02-09 ENCOUNTER — Encounter: Payer: Self-pay | Admitting: Family Medicine

## 2024-02-09 ENCOUNTER — Ambulatory Visit: Payer: Self-pay | Admitting: Family Medicine

## 2024-02-09 ENCOUNTER — Ambulatory Visit: Admitting: Family Medicine

## 2024-02-09 VITALS — BP 112/74 | HR 72 | Temp 97.0°F | Ht 76.0 in | Wt 288.5 lb

## 2024-02-09 DIAGNOSIS — Z6835 Body mass index (BMI) 35.0-35.9, adult: Secondary | ICD-10-CM

## 2024-02-09 DIAGNOSIS — G4733 Obstructive sleep apnea (adult) (pediatric): Secondary | ICD-10-CM

## 2024-02-09 DIAGNOSIS — E876 Hypokalemia: Secondary | ICD-10-CM

## 2024-02-09 DIAGNOSIS — I10 Essential (primary) hypertension: Secondary | ICD-10-CM

## 2024-02-09 DIAGNOSIS — F331 Major depressive disorder, recurrent, moderate: Secondary | ICD-10-CM

## 2024-02-09 DIAGNOSIS — I480 Paroxysmal atrial fibrillation: Secondary | ICD-10-CM

## 2024-02-09 DIAGNOSIS — E269 Hyperaldosteronism, unspecified: Secondary | ICD-10-CM

## 2024-02-09 DIAGNOSIS — R4189 Other symptoms and signs involving cognitive functions and awareness: Secondary | ICD-10-CM | POA: Insufficient documentation

## 2024-02-09 LAB — COMPREHENSIVE METABOLIC PANEL WITH GFR
ALT: 20 U/L (ref 3–53)
AST: 18 U/L (ref 5–37)
Albumin: 4.2 g/dL (ref 3.5–5.2)
Alkaline Phosphatase: 90 U/L (ref 39–117)
BUN: 18 mg/dL (ref 6–23)
CO2: 32 meq/L (ref 19–32)
Calcium: 9.1 mg/dL (ref 8.4–10.5)
Chloride: 103 meq/L (ref 96–112)
Creatinine, Ser: 1.17 mg/dL (ref 0.40–1.50)
GFR: 60.92 mL/min
Glucose, Bld: 88 mg/dL (ref 70–99)
Potassium: 3.9 meq/L (ref 3.5–5.1)
Sodium: 142 meq/L (ref 135–145)
Total Bilirubin: 0.6 mg/dL (ref 0.2–1.2)
Total Protein: 6.8 g/dL (ref 6.0–8.3)

## 2024-02-09 MED ORDER — MEMANTINE HCL 10 MG PO TABS: 10.0000 mg | ORAL_TABLET | Freq: Two times a day (BID) | ORAL | 1 refills | Status: AC

## 2024-02-09 MED ORDER — ZEPBOUND 2.5 MG/0.5ML ~~LOC~~ SOAJ: 2.5000 mg | SUBCUTANEOUS | 0 refills | Status: AC

## 2024-02-09 MED ORDER — POTASSIUM CHLORIDE CRYS ER 20 MEQ PO TBCR: 20.0000 meq | EXTENDED_RELEASE_TABLET | Freq: Every day | ORAL | 1 refills | Status: AC

## 2024-02-09 NOTE — Progress Notes (Signed)
 "  Subjective:     Patient ID: Brandon [name]., male    DOB: 1950-01-25, 74 y.o.   MRN: [id number]  Chief Complaint  Patient presents with   Depression    Pt is here for chronic issues    Discussed the use of AI scribe software for clinical note transcription with the patient, who gave verbal consent to proceed.  History of Present Illness Brandon [name]. is a 74 year old male with atrial fibrillation and anxiety who presents for follow-up after cardioversion and medication management.  He recently underwent a cardioversion procedure and is now in normal sinus rhythm. He feels better and is currently taking carvedilol  and apixaban . No chest pain, heart racing, coughing, or shortness of breath.  For anxiety, he is taking bupropion  150 mg and escitalopram  20 mg, which are working well. He notes an improvement in mood and has resumed hobbies that he had previously set aside. No thoughts of suicide.  He has a history of severe sleep apnea diagnosed approximately ten years ago, with a reported 40 episodes per hour. He is currently dealing with insurance issues regarding treatment approval of zepbound .  Can't tolerate cpap.  Saw neuro recently and recommended zepbound (note reviewed).  He is on multiple medications: amlodipine  5 mg, atorvastatin  40 mg, eplerenone , gabapentin , memantine , and methimazole . His short-term memory is still poor despite memantine  use.  He takes potassium supplements, currently one pill a day. His potassium levels were low in December but high in Nov.  He has not started taking ropinirole  for restless legs yet. He is also taking tamsulosin  for prostate issues.  He does not consume alcohol.    Obesity-limited exercise d/t chronic pain.      Health Maintenance Due  Topic Date Due   Medicare Annual Wellness (AWV)  04/01/2024    Past Medical History:  Diagnosis Date   Allergy 01/05/1664   Anxiety 05/12/1996   Arthritis 07/30/1993   Benign neoplasm of colon     Benign prostatic hyperplasia with urinary obstruction    Bilateral inguinal hernia 05/29/2011   Bilateral pulmonary embolism (HCC) 03/2012   admitted to [facility],  treated with Xarelto    Breast lump 03/29/2019   Calculus of kidney 10/21/2016   Cataract    Chronic bilateral low back pain with sciatica 03/21/2012   Chronic pain    Finger mass, right 10/17/2020   Graves disease    History of alcohol abuse 03/07/2016   History of colon polyps 11/18/2015   History of kidney stones    History of pulmonary embolism 03/19/2012   Hyperlipemia    Hypertension    Insomnia    Insomnia due to medical condition 12/23/2016   Polyuria secondary to BPH   Major depressive disorder, recurrent episode    Mitral regurgitation 04/24/2013   Mild by TEE   Morbid obesity (HCC) 03/07/2016   Myocardial infarction (HCC) 02/21/2003   Nonrheumatic mitral valve insufficiency 04/25/2013   Mild by TEE  Overview:  Overview:  Mild by TEE Overview:  Overview:  Overview:  Mild by TEE Overview:  Overview:  Mild by TEE   Obesity    OSA (obstructive sleep apnea)    noncompliant with CPAP.  07/27/13- awaiting a CPAP- unable to tolerate mask   Osteoporosis    Persistent atrial fibrillation (HCC)    Recurrent unilateral inguinal hernia 07/17/2013   Restless leg syndrome    takes gabapentin    S/P right total knee arthroplasty 05/24/2018   Sciatica  Sleep apnea    Spinal stenosis, lumbar region with neurogenic claudication    SVT (supraventricular tachycardia) 03/07/2016   Thrombus of left atrial appendage 03/09/2013   Uncomplicated asthma 06/20/2008   Overview:  Overview:  Overview:  Overview:  Qualifier: Diagnosis of  By: Krystal MD, Reyes LABOR Overview:  Overview:  Qualifier: Diagnosis of  By: Krystal MD, Reyes LABOR   Ventral hernia, unspecified, without mention of obstruction or gangrene    right abdominal wall    Past Surgical History:  Procedure Laterality Date   ATRIAL FIBRILLATION ABLATION N/A 02/27/2022    Procedure: ATRIAL FIBRILLATION ABLATION;  Surgeon: Nancey Eulas BRAVO, MD;  Location: [facility] INVASIVE CV LAB;  Service: Cardiovascular;  Laterality: N/A;   CARDIOVERSION N/A 03/21/2012   Procedure: CARDIOVERSION;  Surgeon: Salena GORMAN Negri, MD;  Location: [facility] ENDOSCOPY;  Service: Cardiovascular;  Laterality: N/A;   CARDIOVERSION N/A 04/24/2013   Procedure: CARDIOVERSION;  Surgeon: Leim VEAR Moose, MD;  Location: [facility] ENDOSCOPY;  Service: Cardiovascular;  Laterality: N/A;   CARDIOVERSION N/A 06/27/2015   Procedure: CARDIOVERSION;  Surgeon: Ezra GORMAN Shuck, MD;  Location: [facility] ENDOSCOPY;  Service: Cardiovascular;  Laterality: N/A;   CARDIOVERSION N/A 11/11/2021   Procedure: CARDIOVERSION;  Surgeon: Pietro Redell GORMAN, MD;  Location: [facility] ENDOSCOPY;  Service: Cardiovascular;  Laterality: N/A;   CARDIOVERSION N/A 05/11/2022   Procedure: CARDIOVERSION;  Surgeon: Santo Stanly LABOR, MD;  Location: [facility] INVAS[facility] CV LAB;  Service: Cardiovascular;  Laterality: N/A;   CARDIOVERSION N/A 07/13/2022   Procedure: CARDIOVERSION;  Surgeon: Francyne Headland, MD;  Location: [facility] CV LAB;  Service: Cardiovascular;  Laterality: N/A;   CARDIOVERSION N/A 12/14/2023   Procedure: CARDIOVERSION;  Surgeon: Jeffrie Oneil BROCKS, MD;  Location: [facility];  Service: Cardiovascular;  Laterality: N/A;   CATARACT EXTRACTION     COLONOSCOPY W/ POLYPECTOMY     ELECTROPHYSIOLOGIC STUDY N/A 07/30/2015   Procedure: Atrial Fibrillation Ablation;  Surgeon: Lynwood Rakers, MD;  Location: [facility] INVASIVE CV LAB;  Service: Cardiovascular;  Laterality: N/A;   EXTRACORPOREAL SHOCK WAVE LITHOTRIPSY Right 10/29/2016   Procedure: RIGHT EXTRACORPOREAL SHOCK WAVE LITHOTRIPSY (ESWL);  Surgeon: Alvaro Hummer, MD;  Location: [facility] ORS;  Service: Urology;  Laterality: Right;   EYE SURGERY Right    cataract   FEMUR FRACTURE SURGERY     HERNIA REPAIR  07/06/2011   INGUINAL HERNIA REPAIR Right 07/28/2013   Procedure: RIGHT INGUINAL HERNIA REPAIR;  Surgeon:  Donnice POUR. Belinda, MD;  Location: [facility] OR;  Service: General;  Laterality: Right;   INGUINAL HERNIA REPAIR Right 09/22/2016   Procedure: LAPAROSCOPIC RIGHT INGUINAL HERNIA;  Surgeon: Stevie Herlene Righter, MD;  Location: [facility] ORS;  Service: General;  Laterality: Right;  With MESH   INSERTION OF MESH Right 07/28/2013   Procedure: INSERTION OF MESH;  Surgeon: Donnice POUR. Tsuei, MD;  Location: [facility] OR;  Service: General;  Laterality: Right;   JOINT REPLACEMENT  Both knees 2018   KNEE ARTHROSCOPY     left   REPLACEMENT TOTAL KNEE Left    ROTATOR CUFF REPAIR     right   ROTATOR CUFF REPAIR Left 03/08/2014   DR SUPPLE   SHOULDER ARTHROSCOPY WITH ROTATOR CUFF REPAIR AND SUBACROMIAL DECOMPRESSION Left 03/08/2014   Procedure: LEFT SHOULDER ARTHROSCOPY WITH ROTATOR CUFF REPAIR/SUBACROMIAL DECOMPRESSION/DISTAL CLAVICLE RESECTION;  Surgeon: Franky CHRISTELLA Pointer, MD;  Location: [facility] OR;  Service: Orthopedics;  Laterality: Left;   SVT ABLATION N/A 01/23/2019   Procedure: SVT ABLATION;  Surgeon: Waddell Danelle ORN, MD;  Location: [facility] INVASIVE  CV LAB;  Service: Cardiovascular;  Laterality: N/A;   TEE WITHOUT CARDIOVERSION N/A 03/21/2012   Procedure: TRANSESOPHAGEAL ECHOCARDIOGRAM (TEE);  Surgeon: Salena GORMAN Negri, MD;  Location: [facility] ENDOSCOPY;  Service: Cardiovascular;  Laterality: N/A;   TEE WITHOUT CARDIOVERSION N/A 04/24/2013   Procedure: TRANSESOPHAGEAL ECHOCARDIOGRAM (TEE);  Surgeon: Leim VEAR Moose, MD;  Location: [facility] ENDOSCOPY;  Service: Cardiovascular;  Laterality: N/A;   TEE WITHOUT CARDIOVERSION N/A 07/29/2015   Procedure: TRANSESOPHAGEAL ECHOCARDIOGRAM (TEE);  Surgeon: Vina LULLA Gull, MD;  Location: [facility] ENDOSCOPY;  Service: Cardiovascular;  Laterality: N/A;   TEE WITHOUT CARDIOVERSION N/A 05/11/2022   Procedure: TRANSESOPHAGEAL ECHOCARDIOGRAM;  Surgeon: Santo Stanly LABOR, MD;  [facility]: [facility] INVASIVE CV LAB;  Service: Cardiovascular;  Laterality: N/A;   TONSILLECTOMY     TOTAL KNEE ARTHROPLASTY Right 05/24/2018    Procedure: RIGHT TOTAL KNEE ARTHROPLASTY;  Surgeon: Ernie Cough, MD;  Location: [facility] ORS;  Service: Orthopedics;  Laterality: Right;  70 mins    Current Medications[1]  Allergies[2] ROS neg/noncontributory except as noted HPI/below      Objective:     BP 112/74 (BP Location: Left Arm, Patient Position: Sitting, Cuff Size: Large)   Pulse 72   Temp (!) 97 F (36.1 C) (Temporal)   Ht 6' 4 (1.93 m)   Wt 288 lb 8 oz (130.9 kg)   SpO2 98%   BMI 35.12 kg/m  Wt Readings from Last 3 Encounters:  02/09/24 288 lb 8 oz (130.9 kg)  01/24/24 288 lb (130.6 kg)  01/20/24 288 lb 3.2 oz (130.7 kg)    Physical Exam VITALS: P- 65 GENERAL: Well developed, well nourished, no acute distress. HEAD EYES EARS NOSE THROAT: Normocephalic, atraumatic, conjunctiva not injected, sclera nonicteric. CARDIAC: Regular rate and rhythm, heart rate 67 bpm, normal sinus rhythm, S1 S2 present, no murmur, dorsalis pedis 2 plus bilaterally. NECK: Supple, no thyromegaly, no nodes, no carotid bruits. LUNGS: Clear to auscultation bilaterally, no wheezes. ABDOMEN: Bowel sounds present, soft, non-tender, non-distended, no hepatosplenomegaly, no masses. EXTREMITIES: No edema. MUSCULOSKELETAL: No gross abnormalities. NEUROLOGICAL: Alert and oriented x3, cranial nerves II through XII intact. PSYCHIATRIC: Normal mood, good eye contact.  Reviewed notes from Dr. Craig), Card, and sleep study(copy in media) I personally spent a total of 43 minutes in the care of the patient today including preparing to see the patient, getting/reviewing separately obtained history, performing a medically appropriate exam/evaluation, counseling and educating, placing orders, referring and communicating with other health care professionals, documenting clinical information in the EHR, independently interpreting results, communicating results, and coordinating care.       Assessment & Plan:  Obstructive sleep apnea -     Zepbound ;  Inject 2.5 mg into the skin once a week.  Dispense: 2 mL; Refill: 0  Paroxysmal atrial fibrillation (HCC)  Essential hypertension -     Comprehensive metabolic panel with GFR  Class 2 severe obesity with serious comorbidity and body mass index (BMI) of 35.0 to 35.9 in adult, unspecified obesity type  Hyperaldosteronism -     Basic metabolic panel with GFR  Chronic hypokalemia -     Basic metabolic panel with GFR  Moderate episode of recurrent major depressive disorder (HCC)  Cognitive decline -     Memantine  HCl; Take 1 tablet (10 mg total) by mouth 2 (two) times daily.  Dispense: 180 tablet; Refill: 1    Assessment and Plan Assessment & Plan Class 2 severe obesity with serious comorbidity   He is managing class 2 severe obesity with serious comorbidity.  Weight loss strategies and the potential use of a GLP-1 agonist were discussed. Emphasis was placed on exercise within limitations and gradual dose titration of zepbound  to manage side effects and achieve weight loss goals. A GLP-1 agonist injection was initiated once weekly. He was advised to take Metamucil daily to prevent constipation and encouraged to exercise within limitations, such as walking or wall pushups. He should message in one month regarding dose adjustment based on side effects and weight loss progress.  Paroxysmal atrial fibrillation   He is currently in normal sinus rhythm following successful cardioversion and continues on carvedilol  and apixaban  for rhythm control and anticoagulation. He should continue carvedilol  and apixaban  as prescribed. Managed by Card.  Essential hypertension   chronic. controlled His essential hypertension is managed with amlodipine , carvedilol , and eplerenone . Blood pressure control is ongoing, and he should continue the current antihypertensive regimen.  Obstructive sleep apnea   He has severe obstructive sleep apnea diagnosed approximately ten years ago. Previous sleep study  documentation is necessary for insurance approval of treatment. Present in media.  Intol cpap.  Rec zepbound .  He has afib as well and could benefit from wt loss and control of the OSA.   Cognitive decline   He experiences short-term memory issues and is currently on memantine , though he reports it may not be effective. The memantine  prescription was refilled.  Depression and anxiety   His depression and anxiety are managed with bupropion  and escitalopram . He reports improvement in mood and anxiety symptoms with no suicidal ideation. He should continue bupropion  and escitalopram  as prescribed.  Chronic pain   His chronic pain is managed with gabapentin . He reports ongoing pain management needs and should continue gabapentin  as prescribed.  Restless legs syndrome   His restless legs syndrome is managed with ropinirole , but he has not yet started the medication. He should start ropinirole  as prescribed.  Benign prostatic hyperplasia   His benign prostatic hyperplasia is managed with Tamsulosin . Referred to Urol in Nov, but hasn't heard.  Advised to call and info given  Hyperthyroidism   His hyperthyroidism is managed with methimazole . He should continue methimazole  as prescribed.managed by endo  Hypokalemia   He had previous high potassium levels and is currently taking one potassium pill daily as per cardiologist's advice. Blood work was ordered to check current potassium levels, and potassium supplementation will be adjusted based on blood work results.     Return in about 3 months (around 05/08/2024) for chronic follow-up.  Jenkins CHRISTELLA Carrel, MD     [1]  Current Outpatient Medications:    amLODipine  (NORVASC ) 5 MG tablet, TAKE 1 TABLET (5 MG TOTAL) BY MOUTH DAILY., Disp: 90 tablet, Rfl: 3   atorvastatin  (LIPITOR) 40 MG tablet, TAKE 1 TABLET BY MOUTH EVERY DAY, Disp: 90 tablet, Rfl: 1   baclofen  (LIORESAL ) 10 MG tablet, TAKE 1/2-1 TABLET BY MOUTH EVERY 8 (EIGHT) HOURS AS NEEDED FOR MUSCLE  SPASMS., Disp: 180 tablet, Rfl: 1   buPROPion  (WELLBUTRIN  XL) 150 MG 24 hr tablet, Take 1 tablet (150 mg total) by mouth daily., Disp: 90 tablet, Rfl: 1   carvedilol  (COREG ) 25 MG tablet, TAKE 1 TABLET (25 MG TOTAL) BY MOUTH TWICE A DAY WITH MEALS, Disp: 180 tablet, Rfl: 0   Cyanocobalamin  (VITAMIN B-12 PO), Take 1 tablet by mouth every morning., Disp: , Rfl:    ELIQUIS  5 MG TABS tablet, TAKE 1 TABLET BY MOUTH TWICE A DAY, Disp: 60 tablet, Rfl: 5   eplerenone  (INSPRA ) 25 MG tablet,  TAKE 1 TABLET (25 MG TOTAL) BY MOUTH DAILY., Disp: 90 tablet, Rfl: 1   escitalopram  (LEXAPRO ) 20 MG tablet, Take 1 tablet (20 mg total) by mouth daily., Disp: 90 tablet, Rfl: 1   flecainide  (TAMBOCOR ) 50 MG tablet, Take 1 tablet (50 mg total) by mouth 2 (two) times daily., Disp: , Rfl:    fluticasone  (FLONASE ) 50 MCG/ACT nasal spray, SPRAY 2 SPRAYS INTO EACH NOSTRIL EVERY DAY, Disp: 48 mL, Rfl: 1   gabapentin  (NEURONTIN ) 400 MG capsule, TAKE 1 CAPSULE (400 MG TOTAL) BY MOUTH DAILY., Disp: 30 capsule, Rfl: 2   gabapentin  (NEURONTIN ) 800 MG tablet, Take 1 tablet (800 mg total) by mouth at bedtime., Disp: 90 tablet, Rfl: 6   ketoconazole  (NIZORAL ) 2 % cream, Apply 1 Application topically 2 (two) times daily., Disp: 60 g, Rfl: 0   MAGNESIUM  PO, Take 1 tablet by mouth every evening., Disp: , Rfl:    methimazole  (TAPAZOLE ) 5 MG tablet, Take 0.5 tablets (2.5 mg total) by mouth daily., Disp: 45 tablet, Rfl: 3   potassium chloride  SA (KLOR-CON  M) 20 MEQ tablet, Take 2 tablets (40 mEq total) by mouth 2 (two) times daily., Disp: 360 tablet, Rfl: 1   rOPINIRole  (REQUIP ) 0.5 MG tablet, Take one po qEvening and one po qHS, Disp: 60 tablet, Rfl: 11   Suzetrigine  50 MG TABS, Take 50 mg by mouth every 12 (twelve) hours as needed. Take two 50mg  capsules for your first dose at the start of a pain episode on an empty stomach, followed by 50mg  (1 capsule)  with or without food every 12 hours until pain episode is improved, Disp: 29 tablet,  Rfl: 0   tamsulosin  (FLOMAX ) 0.4 MG CAPS capsule, Take 1 capsule (0.4 mg total) by mouth daily. TAKE 1 CAPSULE BY MOUTH EVERY DAY. Patient to follow up with PCP prior to future refills, Disp: 90 capsule, Rfl: 3   tirzepatide  (ZEPBOUND ) 2.5 MG/0.5ML Pen, Inject 2.5 mg into the skin once a week., Disp: 2 mL, Rfl: 0   memantine  (NAMENDA ) 10 MG tablet, Take 1 tablet (10 mg total) by mouth 2 (two) times daily., Disp: 180 tablet, Rfl: 1 [2]  Allergies Allergen Reactions   Ace Inhibitors Cough   Albuterol  Other (See Comments)    Racing heart   "

## 2024-02-09 NOTE — Patient Instructions (Addendum)
 It was very nice to see you today!  Colace 100-200mg , metamucil, miralax   [facility] [address] [place] [address] [place] [facility] [employer] [place], [place]  [address] [phone]  Message when take last dose of zepbound  for increase or same dose   PLEASE NOTE:  If you had any lab tests please let us  know if you have not heard back within a few days. You may see your results on MyChart before we have a chance to review them but we will give you a call once they are reviewed by us . If we ordered any referrals today, please let us  know if you have not heard from their office within the next week.   Please try these tips to maintain a healthy lifestyle:  Eat most of your calories during the day when you are active. Eliminate processed foods including packaged sweets (pies, cakes, cookies), reduce intake of potatoes, white bread, white pasta, and white rice. Look for whole grain options, oat flour or almond flour.  Each meal should contain half fruits/vegetables, one quarter protein, and one quarter carbs (no bigger than a computer mouse).  Cut down on sweet beverages. This includes juice, soda, and sweet tea. Also watch fruit intake, though this is a healthier sweet option, it still contains natural sugar! Limit to 3 servings daily.  Drink at least 1 glass of water with each meal and aim for at least 8 glasses per day  Exercise at least 150 minutes every week.

## 2024-02-09 NOTE — Progress Notes (Signed)
 Once daily potassium is holding   will rewrite prescription to avoid confusion

## 2024-02-10 NOTE — Progress Notes (Signed)
 Called pt and notified smk

## 2024-03-16 ENCOUNTER — Other Ambulatory Visit

## 2024-03-21 ENCOUNTER — Ambulatory Visit: Admitting: Endocrinology

## 2024-04-07 ENCOUNTER — Ambulatory Visit

## 2024-05-12 ENCOUNTER — Ambulatory Visit: Admitting: Family Medicine

## 2024-07-06 ENCOUNTER — Encounter: Admitting: Physical Medicine & Rehabilitation

## 2024-07-12 ENCOUNTER — Ambulatory Visit (HOSPITAL_COMMUNITY): Admitting: Physician Assistant
